# Patient Record
Sex: Female | Born: 1945 | Race: Black or African American | Hispanic: No | Smoking: Former smoker
Health system: Southern US, Community
[De-identification: ages and names within clinical notes are randomized; demographics above are authoritative.]

## PROBLEM LIST (undated history)

## (undated) DIAGNOSIS — K5792 Diverticulitis of intestine, part unspecified, without perforation or abscess without bleeding: Secondary | ICD-10-CM

## (undated) DIAGNOSIS — E079 Disorder of thyroid, unspecified: Secondary | ICD-10-CM

## (undated) DIAGNOSIS — E119 Type 2 diabetes mellitus without complications: Secondary | ICD-10-CM

## (undated) DIAGNOSIS — N289 Disorder of kidney and ureter, unspecified: Secondary | ICD-10-CM

## (undated) DIAGNOSIS — C801 Malignant (primary) neoplasm, unspecified: Secondary | ICD-10-CM

## (undated) DIAGNOSIS — I2541 Coronary artery aneurysm: Secondary | ICD-10-CM

## (undated) DIAGNOSIS — L97929 Non-pressure chronic ulcer of unspecified part of left lower leg with unspecified severity: Secondary | ICD-10-CM

## (undated) DIAGNOSIS — M1990 Unspecified osteoarthritis, unspecified site: Secondary | ICD-10-CM

## (undated) DIAGNOSIS — N189 Chronic kidney disease, unspecified: Secondary | ICD-10-CM

## (undated) DIAGNOSIS — I509 Heart failure, unspecified: Secondary | ICD-10-CM

## (undated) DIAGNOSIS — I10 Essential (primary) hypertension: Secondary | ICD-10-CM

## (undated) DIAGNOSIS — J349 Unspecified disorder of nose and nasal sinuses: Secondary | ICD-10-CM

## (undated) DIAGNOSIS — E785 Hyperlipidemia, unspecified: Secondary | ICD-10-CM

## (undated) HISTORY — DX: Chronic kidney disease, unspecified: N18.9

## (undated) HISTORY — DX: Hyperlipidemia, unspecified: E78.5

## (undated) HISTORY — DX: Type 2 diabetes mellitus without complications: E11.9

## (undated) HISTORY — DX: Essential (primary) hypertension: I10

## (undated) HISTORY — DX: Unspecified osteoarthritis, unspecified site: M19.90

## (undated) HISTORY — DX: Unspecified disorder of nose and nasal sinuses: J34.9

## (undated) HISTORY — DX: Disorder of thyroid, unspecified: E07.9

## (undated) HISTORY — PX: ABDOMINAL HYSTERECTOMY: SHX81

## (undated) HISTORY — DX: Non-pressure chronic ulcer of unspecified part of left lower leg with unspecified severity: L97.929

## (undated) HISTORY — PX: CYST EXCISION: SHX5701

## (undated) HISTORY — DX: Coronary artery aneurysm: I25.41

---

## 1982-10-25 HISTORY — PX: DILATION AND CURETTAGE OF UTERUS: SHX78

## 1983-10-26 HISTORY — PX: PARTIAL HYSTERECTOMY: SHX80

## 2002-10-25 HISTORY — PX: ROTATOR CUFF REPAIR: SHX139

## 2005-09-14 ENCOUNTER — Ambulatory Visit: Payer: Self-pay | Admitting: Unknown Physician Specialty

## 2006-10-13 ENCOUNTER — Ambulatory Visit: Payer: Self-pay | Admitting: Unknown Physician Specialty

## 2006-10-21 ENCOUNTER — Ambulatory Visit: Payer: Self-pay | Admitting: Unknown Physician Specialty

## 2007-01-24 ENCOUNTER — Emergency Department: Payer: Self-pay | Admitting: Emergency Medicine

## 2007-01-30 ENCOUNTER — Emergency Department: Payer: Self-pay | Admitting: Emergency Medicine

## 2007-01-30 ENCOUNTER — Ambulatory Visit: Payer: Self-pay | Admitting: Unknown Physician Specialty

## 2007-01-31 ENCOUNTER — Ambulatory Visit: Payer: Self-pay | Admitting: Rheumatology

## 2007-02-01 ENCOUNTER — Ambulatory Visit: Payer: Self-pay | Admitting: Unknown Physician Specialty

## 2007-08-03 ENCOUNTER — Ambulatory Visit: Payer: Self-pay | Admitting: Unknown Physician Specialty

## 2008-01-25 ENCOUNTER — Other Ambulatory Visit: Payer: Self-pay

## 2008-01-25 ENCOUNTER — Emergency Department: Payer: Self-pay | Admitting: Emergency Medicine

## 2008-01-26 ENCOUNTER — Ambulatory Visit: Payer: Self-pay | Admitting: Emergency Medicine

## 2008-02-16 ENCOUNTER — Emergency Department: Payer: Self-pay | Admitting: Emergency Medicine

## 2009-06-09 ENCOUNTER — Emergency Department: Payer: Self-pay | Admitting: Emergency Medicine

## 2009-06-20 ENCOUNTER — Emergency Department (HOSPITAL_COMMUNITY): Admission: EM | Admit: 2009-06-20 | Discharge: 2009-06-20 | Payer: Self-pay | Admitting: Emergency Medicine

## 2009-06-28 ENCOUNTER — Ambulatory Visit: Payer: Self-pay | Admitting: Orthopedic Surgery

## 2009-09-16 ENCOUNTER — Ambulatory Visit: Payer: Self-pay | Admitting: Cardiology

## 2009-09-30 ENCOUNTER — Ambulatory Visit: Payer: Self-pay | Admitting: Unknown Physician Specialty

## 2009-11-04 ENCOUNTER — Ambulatory Visit: Payer: Self-pay | Admitting: Orthopedic Surgery

## 2009-12-05 ENCOUNTER — Encounter: Payer: Self-pay | Admitting: Orthopedic Surgery

## 2009-12-18 ENCOUNTER — Inpatient Hospital Stay: Payer: Self-pay | Admitting: Internal Medicine

## 2009-12-23 ENCOUNTER — Encounter: Payer: Self-pay | Admitting: Orthopedic Surgery

## 2010-01-23 ENCOUNTER — Encounter: Payer: Self-pay | Admitting: Orthopedic Surgery

## 2010-02-22 ENCOUNTER — Encounter: Payer: Self-pay | Admitting: Orthopedic Surgery

## 2011-01-30 LAB — CBC
Hemoglobin: 11.4 g/dL — ABNORMAL LOW (ref 12.0–15.0)
MCHC: 33.3 g/dL (ref 30.0–36.0)
Platelets: 235 10*3/uL (ref 150–400)
RDW: 13.5 % (ref 11.5–15.5)

## 2011-01-30 LAB — DIFFERENTIAL
Basophils Absolute: 0.1 10*3/uL (ref 0.0–0.1)
Basophils Relative: 1 % (ref 0–1)
Lymphocytes Relative: 24 % (ref 12–46)
Neutro Abs: 6.2 10*3/uL (ref 1.7–7.7)
Neutrophils Relative %: 64 % (ref 43–77)

## 2011-01-30 LAB — POCT CARDIAC MARKERS
Myoglobin, poc: 112 ng/mL (ref 12–200)
Troponin i, poc: <0.05 ng/mL (ref 0.00–0.09)
Troponin i, poc: <0.05 ng/mL (ref 0.00–0.09)

## 2011-01-30 LAB — POCT I-STAT, CHEM 8
BUN: 22 mg/dL (ref 6–23)
Calcium, Ion: 1.22 mmol/L (ref 1.12–1.32)
Chloride: 105 mEq/L (ref 96–112)
HCT: 35 % — ABNORMAL LOW (ref 36.0–46.0)
Potassium: 3.7 mEq/L (ref 3.5–5.1)
Sodium: 141 mEq/L (ref 135–145)

## 2011-06-24 ENCOUNTER — Ambulatory Visit: Payer: Self-pay | Admitting: Cardiology

## 2011-10-13 ENCOUNTER — Ambulatory Visit: Payer: Self-pay | Admitting: Unknown Physician Specialty

## 2012-01-19 ENCOUNTER — Emergency Department: Payer: Self-pay

## 2012-01-19 LAB — CBC
HGB: 11.2 g/dL — ABNORMAL LOW (ref 12.0–16.0)
MCHC: 33.6 g/dL (ref 32.0–36.0)
MCV: 86 fL (ref 80–100)
WBC: 9.6 10*3/uL (ref 3.6–11.0)

## 2012-01-19 LAB — TROPONIN I: Troponin-I: <0.02 ng/mL

## 2012-01-19 LAB — BASIC METABOLIC PANEL
BUN: 23 mg/dL — ABNORMAL HIGH (ref 7–18)
Calcium, Total: 8.7 mg/dL (ref 8.5–10.1)
Chloride: 106 mmol/L (ref 98–107)
Creatinine: 1.33 mg/dL — ABNORMAL HIGH (ref 0.60–1.30)
EGFR (African American): 51 — ABNORMAL LOW
Glucose: 111 mg/dL — ABNORMAL HIGH (ref 65–99)
Potassium: 3.9 mmol/L (ref 3.5–5.1)
Sodium: 141 mmol/L (ref 136–145)

## 2012-01-19 LAB — MAGNESIUM: Magnesium: 1.4 mg/dL — ABNORMAL LOW

## 2012-06-01 ENCOUNTER — Ambulatory Visit: Payer: Self-pay | Admitting: Unknown Physician Specialty

## 2013-05-07 ENCOUNTER — Ambulatory Visit: Payer: Self-pay | Admitting: Ophthalmology

## 2013-05-16 ENCOUNTER — Ambulatory Visit: Payer: Self-pay | Admitting: Ophthalmology

## 2013-07-28 ENCOUNTER — Other Ambulatory Visit: Payer: Self-pay | Admitting: Family Medicine

## 2013-07-28 LAB — CBC WITH DIFFERENTIAL/PLATELET
Basophil #: 0.1 10*3/uL (ref 0.0–0.1)
Basophil %: 1 %
Eosinophil #: 0.3 10*3/uL (ref 0.0–0.7)
Eosinophil %: 2.8 %
HCT: 32.1 % — ABNORMAL LOW (ref 35.0–47.0)
HGB: 10.9 g/dL — ABNORMAL LOW (ref 12.0–16.0)
Lymphocyte #: 2 10*3/uL (ref 1.0–3.6)
Lymphocyte %: 20.4 %
MCH: 29.5 pg (ref 26.0–34.0)
MCHC: 34.1 g/dL (ref 32.0–36.0)
MCV: 86 fL (ref 80–100)
Monocyte #: 0.8 x10 3/mm (ref 0.2–0.9)
Monocyte %: 8.5 %
Neutrophil #: 6.5 10*3/uL (ref 1.4–6.5)
Neutrophil %: 67.3 %
Platelet: 224 10*3/uL (ref 150–440)
RBC: 3.72 10*6/uL — ABNORMAL LOW (ref 3.80–5.20)
RDW: 14 % (ref 11.5–14.5)
WBC: 9.7 10*3/uL (ref 3.6–11.0)

## 2013-08-06 ENCOUNTER — Observation Stay: Payer: Self-pay | Admitting: Internal Medicine

## 2013-08-06 LAB — COMPREHENSIVE METABOLIC PANEL
Albumin: 3.5 g/dL (ref 3.4–5.0)
Chloride: 106 mmol/L (ref 98–107)
Co2: 25 mmol/L (ref 21–32)
Creatinine: 1.8 mg/dL — ABNORMAL HIGH (ref 0.60–1.30)
EGFR (Non-African Amer.): 29 — ABNORMAL LOW
Glucose: 230 mg/dL — ABNORMAL HIGH (ref 65–99)
Osmolality: 292 (ref 275–301)
Potassium: 3.3 mmol/L — ABNORMAL LOW (ref 3.5–5.1)
Sodium: 140 mmol/L (ref 136–145)
Total Protein: 7.7 g/dL (ref 6.4–8.2)

## 2013-08-06 LAB — CBC WITH DIFFERENTIAL/PLATELET
Basophil #: 0.1 10*3/uL (ref 0.0–0.1)
HGB: 11.1 g/dL — ABNORMAL LOW (ref 12.0–16.0)
Lymphocyte #: 2.9 10*3/uL (ref 1.0–3.6)
Lymphocyte %: 25 %
MCH: 29.4 pg (ref 26.0–34.0)
Neutrophil #: 7.4 10*3/uL — ABNORMAL HIGH (ref 1.4–6.5)
Neutrophil %: 64 %
RDW: 13.8 % (ref 11.5–14.5)
WBC: 11.6 10*3/uL — ABNORMAL HIGH (ref 3.6–11.0)

## 2013-08-06 LAB — URINALYSIS, COMPLETE
Glucose,UR: 150 mg/dL (ref 0–75)
Ketone: NEGATIVE
Leukocyte Esterase: NEGATIVE
Protein: 100
Specific Gravity: 1.006 (ref 1.003–1.030)
Squamous Epithelial: 1

## 2013-08-06 LAB — CK TOTAL AND CKMB (NOT AT ARMC)
CK, Total: 164 U/L (ref 21–215)
CK, Total: 159 U/L (ref 21–215)
CK-MB: 1.4 ng/mL (ref 0.5–3.6)

## 2013-08-06 LAB — TROPONIN I: Troponin-I: 0.02 ng/mL

## 2013-08-07 LAB — CBC WITH DIFFERENTIAL/PLATELET
Basophil %: 1 %
Lymphocyte #: 3 10*3/uL (ref 1.0–3.6)
MCV: 87 fL (ref 80–100)
Monocyte %: 9.2 %
Neutrophil %: 57.4 %
Platelet: 244 10*3/uL (ref 150–440)
RBC: 3.62 10*6/uL — ABNORMAL LOW (ref 3.80–5.20)
RDW: 13.7 % (ref 11.5–14.5)
WBC: 10.6 10*3/uL (ref 3.6–11.0)

## 2013-08-07 LAB — BASIC METABOLIC PANEL
Anion Gap: 6 — ABNORMAL LOW (ref 7–16)
Calcium, Total: 9.3 mg/dL (ref 8.5–10.1)
Co2: 25 mmol/L (ref 21–32)
EGFR (Non-African Amer.): 34 — ABNORMAL LOW

## 2014-02-15 ENCOUNTER — Observation Stay: Payer: Self-pay | Admitting: Internal Medicine

## 2014-02-15 LAB — CBC
HCT: 34.1 % — ABNORMAL LOW (ref 35.0–47.0)
HGB: 11.4 g/dL — ABNORMAL LOW (ref 12.0–16.0)
MCH: 29.8 pg (ref 26.0–34.0)
MCHC: 33.6 g/dL (ref 32.0–36.0)
MCV: 89 fL (ref 80–100)
PLATELETS: 218 10*3/uL (ref 150–440)
RBC: 3.84 10*6/uL (ref 3.80–5.20)
RDW: 14.1 % (ref 11.5–14.5)
WBC: 10.9 10*3/uL (ref 3.6–11.0)

## 2014-02-15 LAB — TROPONIN I
Troponin-I: <0.02 ng/mL
Troponin-I: <0.02 ng/mL

## 2014-02-15 LAB — BASIC METABOLIC PANEL
Anion Gap: 4 — ABNORMAL LOW (ref 7–16)
BUN: 35 mg/dL — AB (ref 7–18)
CREATININE: 1.75 mg/dL — AB (ref 0.60–1.30)
Calcium, Total: 8.7 mg/dL (ref 8.5–10.1)
Chloride: 106 mmol/L (ref 98–107)
Co2: 27 mmol/L (ref 21–32)
GFR CALC AF AMER: 34 — AB
GFR CALC NON AF AMER: 29 — AB
Glucose: 251 mg/dL — ABNORMAL HIGH (ref 65–99)
Osmolality: 290 (ref 275–301)
POTASSIUM: 3.8 mmol/L (ref 3.5–5.1)
SODIUM: 137 mmol/L (ref 136–145)

## 2014-02-15 LAB — HEPATIC FUNCTION PANEL A (ARMC)
ALK PHOS: 63 U/L
ALT: 16 U/L (ref 12–78)
AST: 21 U/L (ref 15–37)
Albumin: 3.6 g/dL (ref 3.4–5.0)
Bilirubin,Total: 0.3 mg/dL (ref 0.2–1.0)
Total Protein: 8 g/dL (ref 6.4–8.2)

## 2014-02-15 LAB — CK TOTAL AND CKMB (NOT AT ARMC)
CK, TOTAL: 182 U/L
CK, Total: 168 U/L
CK, Total: 208 U/L — ABNORMAL HIGH
CK-MB: 1.2 ng/mL (ref 0.5–3.6)
CK-MB: 1.1 ng/mL (ref 0.5–3.6)
CK-MB: 1.2 ng/mL (ref 0.5–3.6)

## 2014-02-16 DIAGNOSIS — R079 Chest pain, unspecified: Secondary | ICD-10-CM

## 2014-02-16 LAB — CBC WITH DIFFERENTIAL/PLATELET
Basophil #: 0.1 10*3/uL (ref 0.0–0.1)
Basophil %: 1 %
Eosinophil #: 0.3 10*3/uL (ref 0.0–0.7)
Eosinophil %: 3.1 %
HCT: 30.4 % — AB (ref 35.0–47.0)
HGB: 10.2 g/dL — ABNORMAL LOW (ref 12.0–16.0)
Lymphocyte #: 2.6 10*3/uL (ref 1.0–3.6)
Lymphocyte %: 26.3 %
MCH: 29.4 pg (ref 26.0–34.0)
MCHC: 33.4 g/dL (ref 32.0–36.0)
MCV: 88 fL (ref 80–100)
Monocyte #: 1 x10 3/mm — ABNORMAL HIGH (ref 0.2–0.9)
Monocyte %: 10.2 %
Neutrophil #: 5.9 10*3/uL (ref 1.4–6.5)
Neutrophil %: 59.4 %
PLATELETS: 218 10*3/uL (ref 150–440)
RBC: 3.45 10*6/uL — ABNORMAL LOW (ref 3.80–5.20)
RDW: 13.8 % (ref 11.5–14.5)
WBC: 10 10*3/uL (ref 3.6–11.0)

## 2014-02-16 LAB — BASIC METABOLIC PANEL
Anion Gap: 5 — ABNORMAL LOW (ref 7–16)
BUN: 25 mg/dL — ABNORMAL HIGH (ref 7–18)
CREATININE: 1.63 mg/dL — AB (ref 0.60–1.30)
Calcium, Total: 8.8 mg/dL (ref 8.5–10.1)
Chloride: 110 mmol/L — ABNORMAL HIGH (ref 98–107)
Co2: 28 mmol/L (ref 21–32)
EGFR (Non-African Amer.): 32 — ABNORMAL LOW
GFR CALC AF AMER: 37 — AB
Glucose: 55 mg/dL — ABNORMAL LOW (ref 65–99)
OSMOLALITY: 287 (ref 275–301)
Potassium: 3.6 mmol/L (ref 3.5–5.1)
SODIUM: 143 mmol/L (ref 136–145)

## 2014-02-16 LAB — LIPID PANEL
Cholesterol: 140 mg/dL (ref 0–200)
HDL Cholesterol: 66 mg/dL — ABNORMAL HIGH (ref 40–60)
Ldl Cholesterol, Calc: 61 mg/dL (ref 0–100)
Triglycerides: 63 mg/dL (ref 0–200)
VLDL Cholesterol, Calc: 13 mg/dL (ref 5–40)

## 2014-02-16 LAB — SEDIMENTATION RATE: Erythrocyte Sed Rate: 70 mm/hr — ABNORMAL HIGH (ref 0–30)

## 2014-02-16 LAB — MAGNESIUM: Magnesium: 1.6 mg/dL — ABNORMAL LOW

## 2014-03-01 DIAGNOSIS — M169 Osteoarthritis of hip, unspecified: Secondary | ICD-10-CM | POA: Insufficient documentation

## 2014-03-01 DIAGNOSIS — G5603 Carpal tunnel syndrome, bilateral upper limbs: Secondary | ICD-10-CM | POA: Insufficient documentation

## 2014-03-01 DIAGNOSIS — E785 Hyperlipidemia, unspecified: Secondary | ICD-10-CM | POA: Insufficient documentation

## 2014-03-01 DIAGNOSIS — E039 Hypothyroidism, unspecified: Secondary | ICD-10-CM | POA: Insufficient documentation

## 2014-04-23 ENCOUNTER — Emergency Department: Payer: Self-pay | Admitting: Emergency Medicine

## 2014-04-23 LAB — URINALYSIS, COMPLETE
Bilirubin,UR: NEGATIVE
Glucose,UR: NEGATIVE mg/dL (ref 0–75)
KETONE: NEGATIVE
Nitrite: NEGATIVE
Ph: 8 (ref 4.5–8.0)
Protein: NEGATIVE
Specific Gravity: 1.004 (ref 1.003–1.030)
WBC UR: 2 /HPF (ref 0–5)

## 2014-04-23 LAB — CBC WITH DIFFERENTIAL/PLATELET
BASOS ABS: 0.1 10*3/uL (ref 0.0–0.1)
Basophil %: 1.3 %
EOS ABS: 0.3 10*3/uL (ref 0.0–0.7)
Eosinophil %: 3.7 %
HCT: 34.8 % — AB (ref 35.0–47.0)
HGB: 11 g/dL — ABNORMAL LOW (ref 12.0–16.0)
LYMPHS ABS: 1.2 10*3/uL (ref 1.0–3.6)
Lymphocyte %: 14.9 %
MCH: 28.5 pg (ref 26.0–34.0)
MCHC: 31.8 g/dL — AB (ref 32.0–36.0)
MCV: 90 fL (ref 80–100)
Monocyte #: 0.8 x10 3/mm (ref 0.2–0.9)
Monocyte %: 9.2 %
NEUTROS ABS: 5.9 10*3/uL (ref 1.4–6.5)
Neutrophil %: 70.9 %
PLATELETS: 222 10*3/uL (ref 150–440)
RBC: 3.88 10*6/uL (ref 3.80–5.20)
RDW: 14.3 % (ref 11.5–14.5)
WBC: 8.4 10*3/uL (ref 3.6–11.0)

## 2014-04-23 LAB — TROPONIN I
Troponin-I: <0.02 ng/mL
Troponin-I: <0.02 ng/mL

## 2014-04-23 LAB — BASIC METABOLIC PANEL
Anion Gap: 7 (ref 7–16)
BUN: 26 mg/dL — AB (ref 7–18)
CREATININE: 1.88 mg/dL — AB (ref 0.60–1.30)
Calcium, Total: 9.2 mg/dL (ref 8.5–10.1)
Chloride: 104 mmol/L (ref 98–107)
Co2: 27 mmol/L (ref 21–32)
EGFR (African American): 31 — ABNORMAL LOW
GFR CALC NON AF AMER: 27 — AB
Glucose: 156 mg/dL — ABNORMAL HIGH (ref 65–99)
Osmolality: 284 (ref 275–301)
POTASSIUM: 4.1 mmol/L (ref 3.5–5.1)
Sodium: 138 mmol/L (ref 136–145)

## 2014-05-20 ENCOUNTER — Observation Stay: Payer: Self-pay | Admitting: Internal Medicine

## 2014-05-20 LAB — COMPREHENSIVE METABOLIC PANEL
ALBUMIN: 3 g/dL — AB (ref 3.4–5.0)
ALT: 27 U/L
ANION GAP: 7 (ref 7–16)
AST: 14 U/L — AB (ref 15–37)
Alkaline Phosphatase: 54 U/L
BUN: 39 mg/dL — AB (ref 7–18)
Bilirubin,Total: 0.6 mg/dL (ref 0.2–1.0)
CALCIUM: 8.7 mg/dL (ref 8.5–10.1)
CHLORIDE: 103 mmol/L (ref 98–107)
Co2: 29 mmol/L (ref 21–32)
Creatinine: 1.87 mg/dL — ABNORMAL HIGH (ref 0.60–1.30)
EGFR (Non-African Amer.): 27 — ABNORMAL LOW
GFR CALC AF AMER: 31 — AB
Glucose: 102 mg/dL — ABNORMAL HIGH (ref 65–99)
Osmolality: 287 (ref 275–301)
POTASSIUM: 3.4 mmol/L — AB (ref 3.5–5.1)
SODIUM: 139 mmol/L (ref 136–145)
TOTAL PROTEIN: 6.6 g/dL (ref 6.4–8.2)

## 2014-05-20 LAB — CBC
HCT: 35.1 % (ref 35.0–47.0)
HGB: 11.4 g/dL — AB (ref 12.0–16.0)
MCH: 29.4 pg (ref 26.0–34.0)
MCHC: 32.6 g/dL (ref 32.0–36.0)
MCV: 90 fL (ref 80–100)
Platelet: 146 10*3/uL — ABNORMAL LOW (ref 150–440)
RBC: 3.89 10*6/uL (ref 3.80–5.20)
RDW: 14.7 % — AB (ref 11.5–14.5)
WBC: 14.5 10*3/uL — AB (ref 3.6–11.0)

## 2014-05-20 LAB — TROPONIN I
Troponin-I: <0.02 ng/mL
Troponin-I: <0.02 ng/mL

## 2014-05-21 LAB — BASIC METABOLIC PANEL
ANION GAP: 8 (ref 7–16)
BUN: 35 mg/dL — AB (ref 7–18)
CHLORIDE: 104 mmol/L (ref 98–107)
CO2: 28 mmol/L (ref 21–32)
Calcium, Total: 8.9 mg/dL (ref 8.5–10.1)
Creatinine: 2.1 mg/dL — ABNORMAL HIGH (ref 0.60–1.30)
EGFR (Non-African Amer.): 24 — ABNORMAL LOW
GFR CALC AF AMER: 27 — AB
Glucose: 144 mg/dL — ABNORMAL HIGH (ref 65–99)
Osmolality: 290 (ref 275–301)
POTASSIUM: 4.1 mmol/L (ref 3.5–5.1)
Sodium: 140 mmol/L (ref 136–145)

## 2014-05-21 LAB — CBC WITH DIFFERENTIAL/PLATELET
BASOS PCT: 0.2 %
Basophil #: 0 10*3/uL (ref 0.0–0.1)
Eosinophil #: 0 10*3/uL (ref 0.0–0.7)
Eosinophil %: 0 %
HCT: 33.7 % — AB (ref 35.0–47.0)
HGB: 10.8 g/dL — ABNORMAL LOW (ref 12.0–16.0)
LYMPHS ABS: 1.3 10*3/uL (ref 1.0–3.6)
Lymphocyte %: 9.6 %
MCH: 28.9 pg (ref 26.0–34.0)
MCHC: 32.2 g/dL (ref 32.0–36.0)
MCV: 90 fL (ref 80–100)
MONO ABS: 0.9 x10 3/mm (ref 0.2–0.9)
Monocyte %: 7 %
NEUTROS PCT: 83.2 %
Neutrophil #: 11.1 10*3/uL — ABNORMAL HIGH (ref 1.4–6.5)
PLATELETS: 149 10*3/uL — AB (ref 150–440)
RBC: 3.74 10*6/uL — ABNORMAL LOW (ref 3.80–5.20)
RDW: 14.4 % (ref 11.5–14.5)
WBC: 13.4 10*3/uL — AB (ref 3.6–11.0)

## 2014-05-21 LAB — LIPID PANEL
CHOLESTEROL: 190 mg/dL (ref 0–200)
HDL: 97 mg/dL — AB (ref 40–60)
Ldl Cholesterol, Calc: 83 mg/dL (ref 0–100)
Triglycerides: 52 mg/dL (ref 0–200)
VLDL Cholesterol, Calc: 10 mg/dL (ref 5–40)

## 2014-05-21 LAB — PROTIME-INR
INR: 1
Prothrombin Time: 13.4 secs (ref 11.5–14.7)

## 2014-05-22 LAB — BASIC METABOLIC PANEL
Anion Gap: 6 — ABNORMAL LOW (ref 7–16)
BUN: 28 mg/dL — ABNORMAL HIGH (ref 7–18)
CO2: 29 mmol/L (ref 21–32)
CREATININE: 1.75 mg/dL — AB (ref 0.60–1.30)
Calcium, Total: 7.9 mg/dL — ABNORMAL LOW (ref 8.5–10.1)
Chloride: 110 mmol/L — ABNORMAL HIGH (ref 98–107)
EGFR (African American): 34 — ABNORMAL LOW
GFR CALC NON AF AMER: 29 — AB
Glucose: 61 mg/dL — ABNORMAL LOW (ref 65–99)
OSMOLALITY: 292 (ref 275–301)
Potassium: 3.9 mmol/L (ref 3.5–5.1)
Sodium: 145 mmol/L (ref 136–145)

## 2014-05-22 LAB — CBC WITH DIFFERENTIAL/PLATELET
BASOS ABS: 0 10*3/uL (ref 0.0–0.1)
Basophil %: 0.2 %
EOS ABS: 0.2 10*3/uL (ref 0.0–0.7)
EOS PCT: 2.4 %
HCT: 30.1 % — AB (ref 35.0–47.0)
HGB: 9.9 g/dL — AB (ref 12.0–16.0)
Lymphocyte #: 2.5 10*3/uL (ref 1.0–3.6)
Lymphocyte %: 28.8 %
MCH: 29.9 pg (ref 26.0–34.0)
MCHC: 32.8 g/dL (ref 32.0–36.0)
MCV: 91 fL (ref 80–100)
Monocyte #: 0.8 x10 3/mm (ref 0.2–0.9)
Monocyte %: 9.9 %
NEUTROS ABS: 5 10*3/uL (ref 1.4–6.5)
NEUTROS PCT: 58.7 %
Platelet: 113 10*3/uL — ABNORMAL LOW (ref 150–440)
RBC: 3.3 10*6/uL — ABNORMAL LOW (ref 3.80–5.20)
RDW: 14.6 % — AB (ref 11.5–14.5)
WBC: 8.5 10*3/uL (ref 3.6–11.0)

## 2014-05-27 DIAGNOSIS — Z9889 Other specified postprocedural states: Secondary | ICD-10-CM | POA: Insufficient documentation

## 2014-06-07 ENCOUNTER — Ambulatory Visit: Payer: Self-pay | Admitting: Nephrology

## 2014-12-31 ENCOUNTER — Ambulatory Visit: Payer: Self-pay | Admitting: Internal Medicine

## 2015-01-06 ENCOUNTER — Other Ambulatory Visit: Payer: Self-pay | Admitting: Rheumatology

## 2015-01-07 ENCOUNTER — Other Ambulatory Visit: Payer: Self-pay | Admitting: Rheumatology

## 2015-01-07 DIAGNOSIS — M79606 Pain in leg, unspecified: Secondary | ICD-10-CM

## 2015-01-07 DIAGNOSIS — M549 Dorsalgia, unspecified: Secondary | ICD-10-CM

## 2015-01-23 ENCOUNTER — Ambulatory Visit
Admission: RE | Admit: 2015-01-23 | Discharge: 2015-01-23 | Disposition: A | Discharge status: Home / Self Care | Payer: PPO | Source: Ambulatory Visit | Attending: Rheumatology | Admitting: Rheumatology

## 2015-01-23 DIAGNOSIS — M549 Dorsalgia, unspecified: Secondary | ICD-10-CM

## 2015-01-23 DIAGNOSIS — M79606 Pain in leg, unspecified: Secondary | ICD-10-CM

## 2015-01-31 ENCOUNTER — Encounter
Admit: 2015-01-31 | Disposition: A | Discharge status: Home / Self Care | Payer: Self-pay | Attending: Surgery | Admitting: Surgery

## 2015-02-07 DIAGNOSIS — L97929 Non-pressure chronic ulcer of unspecified part of left lower leg with unspecified severity: Secondary | ICD-10-CM

## 2015-02-07 DIAGNOSIS — N189 Chronic kidney disease, unspecified: Secondary | ICD-10-CM

## 2015-02-07 HISTORY — DX: Non-pressure chronic ulcer of unspecified part of left lower leg with unspecified severity: L97.929

## 2015-02-07 HISTORY — DX: Chronic kidney disease, unspecified: N18.9

## 2015-02-14 NOTE — H&P (Signed)
PATIENT NAME:  TERRAN, [name] MR#:  [id number] DATE OF BIRTH:  1946/01/24  DATE OF ADMISSION:  08/06/2013  PRIMARY CARE PHYSICIAN: Dr. Kem Kays,  CHIEF COMPLAINT: Chest pain.   HISTORY OF PRESENT ILLNESS: This is a 69 year old female with significant past medical history of coronary artery disease being followed by Dr. Saralyn Pilar. Last cardiac catheter in 2012 showing 60% stenosis in the mid left circumflex and 50% stenosis of the PDA, history of diabetes, hypertension, hyperlipidemia, who presents with complaint of chest pain. The patient  reports her chest pain is midsternal, radiating to the left arm, happened at rest.  It was accompanied by some mild palpitations and diaphoresis. Denies any shortness of breath or lightheadedness. Pain resolved after the patient receiving sublingual nitroglycerin. In the ED, the patient has no EKG changes. Her first troponin was negative. Currently, the patient reports she is chest pain-free. As well, the patient was found to have significantly elevated blood pressure upon presentation being 212/114, which improved after receiving IV metoprolol. As well, the patient was noticed to have a creatinine of 1.8. By reviewing her old records, the last one in [facility] appears to be at 1.71. She has mild hypokalemia at 3.3. The patient received 324 mg  of aspirin in the ED, and hospitalist service was requested to admit the patient for further evaluation of her chest pain. The patient currently denies any chest pain. Reports it has been resolved. Denies any shortness of breath, any lightheadedness, diaphoresis. Denies any coffee-ground emesis, melena, dysuria, polyuria, fever or chills.   PAST MEDICAL HISTORY: 1. History of coronary artery disease with 60% stenosis of mid left circumflex and 50% stenosis of PDA by cardiac catheter in August 2012.  2. Hypertension.  3. Hyperlipidemia.  4. Diabetes.   ALLERGIES:  1. CIPRO. 2. LEVAQUIN.  3. PENICILLIN.  4. SULFA.    SOCIAL HISTORY: The patient is married, takes care of her husband at home  . She quit tobacco abuse 45 years ago. No alcohol. No drug use.   FAMILY HISTORY: Significant for myocardial infarction in her father in his 36s.   PAST SURGICAL HISTORY:  1. Hysterectomy.  2. Rotator cuff surgery.   HOME MEDICATIONS: 1. Hydrochlorothiazide 25 mg daily.  2. Lantus 36 units subcutaneous at bedtime.  3. Lisinopril 20 mg 2 times a day.  4. Simvastatin 40 mg oral daily.  5. Claritin 10 mg daily.  6. Tylenol Extra Strength as needed.  7. Prilosec 20 mg oral daily.  8. Norvasc 5 mg daily.  9. Aspirin 81 mg 2 times a day.  10. Calcium with vitamin D.  11. Vitamin D3.  12. Humalog subcutaneous use as directed.  13. Metoprolol 25 mg oral p.o. b.i.d.   REVIEW OF SYSTEMS:  CONSTITUTIONAL: The patient denies fever, chills, fatigue, weakness, weight gain, weight loss.  EYES: Denies blurry vision, double vision, pain, inflammation, glaucoma.  ENT: Denies tinnitus, ear pain, hearing loss, epistaxis or discharge.  RESPIRATORY: Denies cough, wheezing, hemoptysis, dyspnea, painful respiratory.  CARDIOVASCULAR: Has complaints of chest pain. Denies any orthopnea, edema, syncope. Had complaint of some palpitations.  GASTROINTESTINAL: Denies nausea, vomiting, diarrhea, abdominal pain, hematemesis, melena, jaundice, rectal bleed.  GENITOURINARY: Denies dysuria, hematuria, renal colic.  ENDOCRINE: Denies polyuria, polydipsia, heat or cold intolerance.  HEMATOLOGY: Denies anemia, easy bruising, bleeding diathesis.  INTEGUMENTARY: Denies acne, rash or skin lesions.  MUSCULOSKELETAL: Denies any neck pain, shoulder pain, cramps, swelling.  NEUROLOGIC: Denies CVA, TIA, headache, vertigo, tremors, migraine.  PSYCHIATRIC: Denies anxiety,  insomnia, depression.   PHYSICAL EXAMINATION: VITAL SIGNS: Temperature 98, pulse 74, respiratory rate 15, blood pressure 172/80, saturating 98% on room air.  GENERAL: Elderly  female who looks comfortable in bed, in no apparent distress.  HEENT: Head atraumatic, normocephalic. Pupils equal, reactive to light. Pink conjunctivae. Anicteric sclerae. Moist oral mucosa.  NECK: Supple. No thyromegaly. No JVD.  CHEST: Good air entry bilaterally. No wheezing, rales, rhonchi.  CARDIOVASCULAR: S1, S2 heard. No rubs, murmur or gallops.  ABDOMEN: Soft, nontender, nondistended. Bowel sounds present.  EXTREMITIES: +1 edema bilaterally. No clubbing. No cyanosis. Dorsalis pedis pulse felt bilaterally  PSYCHIATRIC: Appropriate affect. Awake, alert x 3. Intact judgment and insight.  NEUROLOGIC: Grossly intact. Motor 5/5. No focal deficits.  LYMPHATICS: No cervical or supraclavicular lymphadenopathy.  CARDIOVASCULAR: The patient had dorsalis pedis pulse felt +2 bilaterally.   PERTINENT LABS: Glucose 230. BUN 27, creatinine 1.80, sodium 140, potassium 3.3, chloride 106, CO2 25. Troponin less than 0.02. White blood cells 11.6, hemoglobin 11.1, hematocrit 32.4, platelets 223, INR is 1. EKG showing sinus tachycardia is 102 beats per minute with frequent PVCs.   ASSESSMENT AND PLAN: 1. Chest pain, currently, the patient is chest pain-free. Giving her significant cardiac history and her chest pain resolved with nitroglycerin she will be admitted to telemetry unit. We will continue to cycle her cardiac enzymes and follow the trend. Already received aspirin and we will consult cardiology to see if there is any further work-up indicated at this point, especially with her known history of coronary artery disease. We will add sublingual nitroglycerin as needed.  2. Hypertension: Appears to be uncontrolled. We will continue the patient back on her home medication, as well we will add hydralazine.  3. History of coronary artery disease. The patient is on aspirin, statin, ACE inhibitor and beta blockers.  4. Chronic kidney disease seems to be at baseline. Last creatinine is 1.7. Today is 1.8, seems to  be stable. We will monitor closely, especially as the patient is on lisinopril.  5. Hyperlipidemia. Continue with statin.  6. Diabetes mellitus. We will resume Lantus at a lower dose, we will decrease from 36 to 22 at bedtime, we will add insulin sliding scale.  7. Deep vein thrombosis prophylaxis: We will have the patient on subcutaneous heparin.  8. Gastrointestinal prophylaxis: The patient is on proton pump inhibitor.  9. CODE STATUS: The patient does not have a LIVING WILL. Lives with her husband. Does not have healthcare power of attorney. She reports she is a FULL CODE.   TOTAL TIME SPENT ON ADMISSION AND PATIENT CARE: 55 minutes.    ____________________________ Albertine Patricia, MD dse:sg D: 08/06/2013 05:34:00 ET T: 08/06/2013 06:19:18 ET JOB#: [id number]  cc: Albertine Patricia, MD, <Dictator> Mallorey Odonell Graciela Husbands MD ELECTRONICALLY SIGNED 08/08/2013 4:53

## 2015-02-14 NOTE — Discharge Summary (Signed)
Dates of Admission and Diagnosis:  Date of Admission 06-Aug-2013   Date of Discharge 07-Aug-2013   Admitting Diagnosis Chest pain   Final Diagnosis Chest pain, MI ruled out   Discharge Diagnosis 1 Chest pain, MI ruled out   2 CAD   3 HTN   4 DM   5 CRI    Chief Complaint/History of Present Illness 69 year old lady with h/o non-occlusive CAD, HTN, DM, hyperlipidemia and CRI presented with chest pain at rest, radiating into the left arm and jaw. Cardiac cath on 06/24/2011 revealed 60% stenosis of mid left circumflex and 50% stenosis of PDA. No acute EKG changes noted on presentation to ED. Patient was admitted for further management.   Cardiology:  13-Oct-14 02:41   ECG interpretation Sinus tachycardia with frequent Premature ventricular complexes Septal infarct (cited on or before 25-Jan-2008) Abnormal ECG When compared with ECG of 07-May-2013 15:24, Premature ventricular complexes are now Present Vent. rate has increased BY 42 BPM ----------unconfirmed---------- Confirmed by OVERREAD, NOT ([id number]), editor PEARSON, BARBARA ([id number]) on 08/06/2013 2:05:13 PM  Ventricular Rate 102  Atrial Rate 102  P-R Interval 160  QRS Duration 84  QT 354  QTc 461  P Axis 55  R Axis 16  T Axis 93  14-Oct-14 10:50   Protocol LEXISCAN  Max Work Load 10  Total Exercise Time 61  Max Diastolic BP 98  Max Heart Rate 103  Max Predicted Heart Rate 153  Reason For Termination per lexiscan protocol  ECG interpretation Confirmed by OVERREAD, NOT ([id number]), editor PEARSON, BARBARA ([id number]) on 08/07/2013 11:18:35 AM  Routine Chem:  13-Oct-14 02:57   Glucose, Serum  230  Creatinine (comp)  1.80  eGFR (African American)  33  14-Oct-14 06:04   Glucose, Serum 71  Creatinine (comp)  1.56  eGFR (African American)  39  Cardiac:  13-Oct-14 02:57   Troponin I < 0.02 (0.00-0.05 0.05 ng/mL or less: NEGATIVE  Repeat testing in 3-6 hrs  if clinically indicated. >0.05 ng/mL: POTENTIAL  MYOCARDIAL INJURY.  Repeat  testing in 3-6 hrs if  clinically indicated. NOTE: An increase or decrease  of 30% or more on serial  testing suggests a  clinically important change)  CK, Total 164  CPK-MB, Serum 1.4 (Result(s) reported on 06 Aug 2013 at 03:36AM.)    11:24   Troponin I 0.04 (0.00-0.05 0.05 ng/mL or less: NEGATIVE  Repeat testing in 3-6 hrs  if clinically indicated. >0.05 ng/mL: POTENTIAL  MYOCARDIAL INJURY. Repeat  testing in 3-6 hrs if  clinically indicated. NOTE: An increase or decrease  of 30% or more on serial  testing suggests a  clinically important change)  CK, Total 159  CPK-MB, Serum 1.7 (Result(s) reported on 06 Aug 2013 at 12:08PM.)    18:47   Troponin I 0.02 (0.00-0.05 0.05 ng/mL or less: NEGATIVE  Repeat testing in 3-6 hrs  if clinically indicated. >0.05 ng/mL: POTENTIAL  MYOCARDIAL INJURY. Repeat  testing in 3-6 hrs if  clinically indicated. NOTE: An increase or decrease  of 30% or more on serial  testing suggests a  clinically important change)  CK, Total 159  CPK-MB, Serum 2.1 (Result(s) reported on 06 Aug 2013 at 07:36PM.)   PERTINENT RADIOLOGY STUDIES: XRay:    13-Oct-14 02:52, Chest Portable Single View  Chest Portable Single View   REASON FOR EXAM:    Chest pain  COMMENTS:       PROCEDURE: DXR - DXR PORTABLE CHEST SINGLE VIEW  - Aug 06 2013  2:52AM     RESULT: The lungs are clear. The cardiac silhouette and visualized bony   skeleton are unremarkable.    IMPRESSION:      1. Chest radiograph without evidence of acute cardiopulmonary disease.   2. Comparison to prior dated 01/19/2012.        Verified By: Mikki Santee, M.D., [place] Three sets of cardiac enzymes were negative. MI was ruled out. She received aspirin, beta-blocker, statin, nitrates and ACEI. Patient remained chest pain free during the hospital stay. HTN and diabetes remained controlled and creatinine was stable during the hospital stay. Stress  test was done today morning. Plan is to discharge home after confirming negative stress test results.   Condition on Discharge Stable   DISCHARGE INSTRUCTIONS HOME MEDS:  Medication Reconciliation: Patient's Home Medications at Discharge:     Medication Instructions  hydrochlorothiazide 25 mg oral tablet  1 tab(s) orally once a day (in the morning)    lisinopril 20 mg oral tablet  1 tab(s) orally in the morning and evening   calcium oyster shell  500 mg oral tablet  1 tab(s) orally once a day    metoprolol tablet 25 mg  1 tab(s) orally 2 times a day    prilosec otc 20 mg oral delayed release tablet  1 tab(s) orally once a day   simvastatin 40 mg oral tablet  1 tab(s) orally once a day (at bedtime)   aspirin 81 mg oral tablet   orally 2 times a day   humalog 17units breakfast, 12units lunch, 18units dinner     lantus 100 units/ml subcutaneous solution  36 unit(s) subcutaneous once a day (at bedtime)   claritin 10 mg oral tablet  1 tab(s) orally once a day   amlodipine 5 mg oral tablet  1 tab(s) orally once a day   vitamin d3 400 intl units oral tablet  1 tab(s) orally once a day   nitroglycerin 0.4 mg sublingual tablet  1 tab(s) sublingual , As needed, chest pain    PRESCRIPTIONS: ELECTRONICALLY SUBMITTED   Physician's Instructions:  Home Health? No   Home Oxygen? No   Diet Low Sodium  Low Fat, Low Cholesterol   Activity Limitations As tolerated   Return to Work after follow up visit with MD   Time frame for Follow Up Appointment 1-2 days  Dr. Glendon Axe   Time frame for Follow Up Appointment 1-2 weeks  [facility]   Electronic Signatures: Glendon Axe (MD)  (Signed 14-Oct-14 13:50)  Authored: ADMISSION DATE AND DIAGNOSIS, CHIEF COMPLAINT/HPI, PERTINENT LABS, [place], PATIENT INSTRUCTIONS   Last Updated: 14-Oct-14 13:50 by Glendon Axe (MD)

## 2015-02-14 NOTE — Op Note (Signed)
PATIENT NAME:  [name], [name] MR#:  [id number] DATE OF BIRTH:  1946-01-19  DATE OF PROCEDURE:  05/16/2013  PROCEDURES PERFORMED: 1.  Pars plana vitrectomy of the right eye.  2.  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  3.  Endolaser, right eye.   PREOPERATIVE DIAGNOSES: 1.  Dense vitreous hemorrhage.  2.  Proliferative diabetic retinopathy.  3.  Possible tractional retinal detachment.   POSTOPERATIVE DIAGNOSES: 1.  Dense vitreous hemorrhage.  2.  Proliferative diabetic retinopathy.  3.  Tractional retinal detachment.   PRIMARY SURGEON:  Garlan Fair, M.D.    ANESTHESIA: Retrobulbar block of the right eye with monitored anesthesia care.   COMPLICATIONS: None.   INDICATIONS FOR PROCEDURE: This patient came to my office with decreased vision in her right eye. Examination revealed a dense vitreous hemorrhage and proliferative diabetic retinopathy. Due to the vitreous hemorrhage, it was unclear whether there was a tractional retinal detachment associated with the membranes. There did appear to be elevation. Risks, benefits  and alternatives of the above procedure were discussed, and the patient wished to proceed.   DETAILS OF PROCEDURE: After informed consent was obtained, the patient was brought into the operative suite at [facility]. The patient was placed in supine position, was given a small dose of Alfenta, and a retrobulbar block was performed on the right eye by the primary surgeon without any complications. The right eye was prepped and draped in a sterile manner. After a lid speculum was inserted, a 25-gauge trocar was placed inferotemporally through displaced conjunctiva in an oblique fashion 4 mm beyond the limbus. The infusion cannula was turned on and inserted through the trocar and secured in position with Steri-Strips. Two more trocars were placed in a similar fashion superotemporally and superonasally. The vitreous cutter and light pipe  were introduced in the eye, and a core vitrectomy was performed.  The vitreous face was noted to be attached to the retina for 360 degrees. After the vitreous hemorrhage had been cleared, it became apparent that the patient had multiple areas of tractional retinal detachment in the macula. None of it extended to the fovea. The vitreous cutter, using suction and cut mode, was utilized to elevate the membranes and isolate them. Removal of the membranes was successful, and all traction around the superior and inferior temporal arcades were noted to be relieved. Vitreous face was carefully elevated for 360 degrees from the arcades out to the ora serrata. This was done after the posterior vitreous had been removed via dissection of the membranes. No holes or tears could be identified in any of the areas of traction. Endolaser was introduced and panretinal photocoagulation was performed for 360 degrees. An extra 2 rows of laser were placed around each of the areas of traction, in case there was a microscopic tear that could not be identified. A complete air-fluid exchange was performed. The trocars were removed, and the wounds were noted to be airtight. Pressure in the eye was confirmed to be approximately 15 mmHg. 5 mg of dexamethasone was given into the inferior fornix, and the lid speculum was removed. The eye was cleaned and TobraDex was placed in the eye. A patch and shield were placed over the eye, and the patient was taken to postanesthesia care with instruction to remain face down.      ____________________________ Teresa Pelton. Starling Manns, MD [id number] D: 05/16/2013 09:29:08 ET T: 05/16/2013 11:12:37 ET JOB#: [id number]  cc: Teresa Pelton. Charlynn Salih, MD, <Dictator> Ahlana Slaydon F Aundra Espin  MD ELECTRONICALLY SIGNED 06/13/2013 8:48

## 2015-02-14 NOTE — Consult Note (Signed)
   Present Illness 69 yo female with history of moderate coronary artery disease with a 60% stenosis of the mid left circumflex,  50% stenosis of the pda by cath in 8/12, history of hypertension, hyperlipidemia and diabetes who was admitted with complaints of chest pain.She has ruled out for an mi thus far. She states she has been compliant with her medicaitons. She denies syncopoe or presyncope. EKG was unremarkable for ischemia   Physical Exam:  GEN no acute distress   HEENT PERRL, hearing intact to voice   NECK supple   RESP normal resp effort  clear BS  no use of accessory muscles   CARD Regular rate and rhythm  Normal, S1, S2   ABD denies tenderness  no hernia  no Adominal Mass   LYMPH negative neck   EXTR negative cyanosis/clubbing, negative edema   SKIN normal to palpation   NEURO cranial nerves intact, motor/sensory function intact   PSYCH A+O to time, place, person   Review of Systems:  Subjective/Chief Complaint chest pain and shortness of breath   General: Fatigue   Skin: No Complaints   ENT: No Complaints   Eyes: No Complaints   Neck: No Complaints   Respiratory: Short of breath   Cardiovascular: Chest pain or discomfort  Tightness   Gastrointestinal: No Complaints   Genitourinary: No Complaints   Vascular: No Complaints   Musculoskeletal: No Complaints   Neurologic: No Complaints   Hematologic: No Complaints   Endocrine: No Complaints   Psychiatric: No Complaints   Review of Systems: All other systems were reviewed and found to be negative   Medications/Allergies Reviewed Medications/Allergies reviewed   EKG:  EKG NSR    Sulfa drugs: Itching, Rash, Swelling  Penicillin: Itching, Hives  Levaquin: Other  Cipro: Other   Impression 69 yo female with history of insignficant cad by cath in 2012 who was admitted with chest pain. She has ruled out for an mi thus far. EKG is unremarkable. She is hemodynamically stable at present. Review of  cath reveals that there were no critical lesions in 2012. Would ocnsider fucntional study to evaluate for evidence of ischmia.   Plan 1. Continue current meds 2. Funcitonals study in am to evalaute for evidence of if ischemia 3. Further recs after procedure.   Electronic Signatures: Teodoro Spray (MD)  (Signed 13-Oct-14 18:30)  Authored: General Aspect/Present Illness, History and Physical Exam, Review of System, EKG , Allergies, Impression/Plan   Last Updated: 13-Oct-14 18:30 by Teodoro Spray (MD)

## 2015-02-15 NOTE — Discharge Summary (Signed)
PATIENT NAME:  [name], HYPES MR#:  [id number] DATE OF BIRTH:  08-Mar-1946  DATE OF ADMISSION:  02/15/2014 DATE OF DISCHARGE:  02/16/2014  FINAL DIAGNOSES:  1.  Neck pain/heartburn, thought to be noncardiac.  2.  Coronary artery disease.  3.  Degenerative joint disease.  4.  Gastroesophageal reflux disease.  5.  Diabetes mellitus.   HISTORY AND PHYSICAL:  Please see dictated admission history and physical.  [place]:  The patient was admitted with discomfort that was noted to be in both shoulders, radiating down both arms, lasting about 2 minutes. Also a sensation of heartburn. Did have some discomfort to the jaw. This is something that she has had a past, and it is different from what she had with her heart pains. Cardiac enzymes were followed, with no significant rise noted that would suggest myocardial infarction. Cardiology saw the patient, and the patient has a fairly recent stress test back in October, a cath about 3 years ago. Her symptoms appeared to be more likely musculoskeletal, though gastroesophageal reflux cannot be excluded. We would discuss further options with patient, including watching her over the weekend and with planning a stress test on Monday versus increasing acid reduction medication, giving her nitroglycerin to use, and having her go home and follow up with Cardiology early next week. She favored going home, we discussed risks/benefits, and she was comfortable with this. Therefore, at this time, she will be discharged to home in stable condition with physical activity to be up as tolerated. She should check her blood sugar 3 times a day and record this. I will anticipate her following with Dr. Candiss Norse in the next 2 weeks and with Dr. Saralyn Pilar early next week to determine whether to proceed to stress testing.   DISCHARGE DIET:  2 grams sodium 1800 calorie ADA diet.   DISCHARGE MEDICATIONS:  1.  Hydrochlorothiazide 25 mg p.o. daily.  2.  Lisinopril 20 mg  p.o. b.i.d.  3.  Calcium 500 mg p.o. daily.  4.  Metoprolol tartrate 25 mg p.o. b.i.d.  5.  Aspirin 81 mg p.o. which she takes b.i.d.  6.  Humalog 17 units with breakfast, 12 units with lunch, and 18 units with dinner.  7.  Lantus 36 units subcutaneous at bedtime. 8.  Claritin 10 mg p.o. daily.  9.  Amlodipine 5 mg p.o. daily.  10.  Vitamin D 400 units p.o. daily.  11.  Tramadol 50 mg p.o. q.4 hours as needed for pain. 12.  Actos 15 mg p.o. daily.  13.  Omeprazole 20 mg p.o. b.i.d. for 1 week, then p.o. daily.  14.  Nitroglycerin 0.4 mg sublingually every 5 minutes times a max 3 in 1 day for chest pain.  15.  Atorvastatin 20 mg p.o. daily.   She was given instructions to stop simvastatin as this has been replaced with atorvastatin secondary to her use of amlodipine.    ____________________________ Adin Hector, MD bjk:jm D: 02/16/2014 09:10:05 ET T: 02/16/2014 16:34:33 ET JOB#: [id number]  cc: Adin Hector, MD, <Dictator> Ramonita Lab MD ELECTRONICALLY SIGNED 02/18/2014 7:37

## 2015-02-15 NOTE — Consult Note (Signed)
   Present Illness 69 yo female with history of moderate coronary artery disease with a 60% stenosis of the mid left circumflex,  50% stenosis of the pda by cath in 8/12, history of hypertension, hyperlipidemia and diabetes who was admitted with complaints of chest pain.She has ruled out for an mi thus far. She states she has been compliant with her kmeds. She denies syncopoe or presyncope. EKG was unremarkable for ischemia. Pain is improved. Renal function is moderately reduced with gfr of 31.   Physical Exam:  GEN no acute distress   HEENT PERRL, hearing intact to voice   NECK supple   RESP normal resp effort  clear BS  no use of accessory muscles   CARD Regular rate and rhythm  Normal, S1, S2   ABD denies tenderness  no hernia  no Adominal Mass   LYMPH negative neck   EXTR negative cyanosis/clubbing, negative edema   SKIN normal to palpation   NEURO cranial nerves intact, motor/sensory function intact   PSYCH A+O to time, place, person   Review of Systems:  Subjective/Chief Complaint chest pain and shortness of breath   General: Fatigue   Skin: No Complaints   ENT: No Complaints   Eyes: No Complaints   Neck: No Complaints   Respiratory: Short of breath   Cardiovascular: Chest pain or discomfort  Tightness   Gastrointestinal: No Complaints   Genitourinary: No Complaints   Vascular: No Complaints   Musculoskeletal: No Complaints   Neurologic: No Complaints   Hematologic: No Complaints   Endocrine: No Complaints   Psychiatric: No Complaints   Review of Systems: All other systems were reviewed and found to be negative   Medications/Allergies Reviewed Medications/Allergies reviewed   Family & Social History:  Family and Social History:  Family History Non-Contributory   Social History negative tobacco   EKG:  EKG NSR    Sulfa drugs: Itching, Rash, Swelling  Penicillin: Itching, Hives  Levaquin: Other  Cipro: Other   Impression 68 yo female  with history of insignficant cad by cath in 2012 who was admitted with chest pain. She has ruled out for an mi thus far. EKG is unremarkable. She was admitted in late 2014 with similar problems. SHe states the symptoms have been worsening. WIll need to consider cardiacx cath to evaluate anatomy in patient with diabetes, rest pain and several admission for chest pain with negative functional study.   Plan 1. Continue current meds 2. Risk and benefits of cardiac cath explained to patient and she agrees to procveed. 3. Will proceed with cath in am. Will need to limit contrast as patient has gfr of 31. Will hydrate over night 4 Further recs after procedure.   Electronic Signatures: Teodoro Spray (MD)  (Signed 27-Jul-15 16:36)  Authored: General Aspect/Present Illness, History and Physical Exam, Review of System, Family & Social History, EKG , Allergies, Impression/Plan   Last Updated: 27-Jul-15 16:36 by Teodoro Spray (MD)

## 2015-02-15 NOTE — Discharge Summary (Signed)
Dates of Admission and Diagnosis:  Date of Admission 20-May-2014   Date of Discharge 01-Jan-0001   Admitting Diagnosis Unstable angina   Final Diagnosis Unstable angina, MI ruled out, diabetes, CKD   Discharge Diagnosis 1 Unstable angina, MI ruled out   2 Diabetes   3 CKD    Chief Complaint/History of Present Illness 69 year old lady with h/o diabetes and CKD admitted with c/o chest pain radiating to the jaw. No acute changes on EKG. Initial troponin was negative.   Allergies:  Sulfa drugs: Itching, Rash, Swelling  Penicillin: Itching, Hives  Levaquin: Other  Cipro: Other    Routine Chem:  27-Jul-15 12:46   Creatinine (comp)  1.87  28-Jul-15 03:55   Creatinine (comp)  2.10  29-Jul-15 07:12   Glucose, Serum  61  BUN  28  Creatinine (comp)  1.75  Sodium, Serum 145  Potassium, Serum 3.9  Chloride, Serum  110  CO2, Serum 29  Calcium (Total), Serum  7.9  Anion Gap  6  Osmolality (calc) 292  eGFR (African American)  34  eGFR (Non-African American)  29 (eGFR values <70m/min/1.73 m2 may be an indication of chronic kidney disease (CKD). Calculated eGFR is useful in patients with stable renal function. The eGFR calculation will not be reliable in acutely ill patients when serum creatinine is changing rapidly. It is not useful in  patients on dialysis. The eGFR calculation may not be applicable to patients at the low and high extremes of body sizes, pregnant women, and vegetarians.)  Routine Hem:  29-Jul-15 07:12   WBC (CBC) 8.5  RBC (CBC)  3.30  Hemoglobin (CBC)  9.9  Hematocrit (CBC)  30.1  Platelet Count (CBC)  113  MCV 91  MCH 29.9  MCHC 32.8  RDW  14.6  Neutrophil % 58.7  Lymphocyte % 28.8  Monocyte % 9.9  Eosinophil % 2.4  Basophil % 0.2  Neutrophil # 5.0  Lymphocyte # 2.5  Monocyte # 0.8  Eosinophil # 0.2  Basophil # 0.0 (Result(s) reported on 22 May 2014 at 07:48AM.)   PERTINENT RADIOLOGY STUDIES: XRay:    27-Jul-15 14:36, Chest Portable Single  View  Chest Portable Single View   REASON FOR EXAM:    cp  COMMENTS:       PROCEDURE: DXR - DXR PORTABLE CHEST SINGLE VIEW  - May 20 2014  2:36PM     CLINICAL DATA:  Chest pain    EXAM:  PORTABLE CHEST - 1 VIEW    COMPARISON:  04/23/2014    FINDINGS:  Cardiomediastinal silhouette is stable. No acute infiltrate or  pleural effusion. No pulmonary edema. Bony thorax is unremarkable.   IMPRESSION:  No active disease.      Electronically Signed    By: LLahoma CrockerM.D.    On: 05/20/2014 14:39         Verified By: LEphraim Hamburger M.D.,   Pertinent Past History:  Pertinent Past History Diabetes CKD   Hospital Course:  Hospital Course 69year old lady with h/o diabetes and CKD admitted with unstable angina. No acute changes on EKG. Serial troponins were negative. No significant disease on cardiac cath done yesterday, cardiac meds were continued. CKD: creatinine iincreased to 2.1 after cath, improved to 1.75 today with i.v hydration. Patient received her usual dose of insulin and sliding scale coverage fro diabetes. Plan is to discharge home. Repeat BMP as out patient in one week.   Condition on Discharge Stable   DISCHARGE INSTRUCTIONS HOME  MEDS:  Medication Reconciliation: Patient's Home Medications at Discharge:     Medication Instructions  amlodipine 5 mg oral tablet  1 tab(s) orally once a day   pioglitazone 15 mg oral tablet  1 tab(s) orally once a day   nitroglycerin 0.4 mg sublingual tablet  1 tab(s) sublingual every 5 minutes, As Needed - for Chest Pain   acetaminophen 500 mg oral tablet  1 tab(s) orally every 6 hours, As Needed - for Pain   aspirin enteric coated 81 mg oral delayed release tablet  1 tab(s) orally once a day   atorvastatin 40 mg oral tablet  1 tab(s) orally once a day (at bedtime)   humalog 100 units/ml subcutaneous solution  15 unit(s) subcutaneous once a day (in the evening) before dinner   lisinopril 20 mg oral tablet  1 tab(s) orally 2 times a day    loratadine 10 mg oral tablet  1 tab(s) orally once a day   magnesium oxide 400 mg oral tablet  1 tab(s) orally 2 times a day   metoprolol tartrate 50 mg oral tablet  1 tab(s) orally 2 times a day   oyster shell calcium with vitamin d 500 mg-200 intl units oral tablet  1 tab(s) orally once a day   prednisone 5 mg oral tablet  1 tab(s) orally once a day   vitamin d3 1000 intl units oral capsule  1 cap(s) orally once a day   humalog 100 units/ml subcutaneous solution  10 unit(s) subcutaneous once a day (in the morning) before breakfast   humalog 100 units/ml subcutaneous solution  12 unit(s) subcutaneous once a day before lunch   lantus 100 units/ml subcutaneous solution  25 unit(s) subcutaneous once a day (at bedtime)   hydrochlorothiazide 25 mg oral tablet  1 tab(s) orally once a day   pantoprazole 40 mg oral delayed release tablet  40 milligram(s) orally once a day    STOP TAKING THE FOLLOWING MEDICATION(S):    hydrochlorothiazide 25 mg oral tablet: 1 tab(s) orally once a day (in the morning)  calcium oyster shell  500 mg oral tablet: 1 tab(s) orally once a day  humalog 17units breakfast, 12units lunch, 18units dinner prilosec otc 20 mg oral delayed release tablet: 1 tab(s) orally twice a day for 1 week, then once daily omeprazole 20 mg oral delayed release capsule: 1 cap(s) orally once a day  Physician's Instructions:  Diet Low Sodium  Low Fat, Low Cholesterol  Carbohydrate Controlled (ADA) Diet  Renal Diet   Activity Limitations As tolerated   Return to Work Not Applicable   Time frame for Follow Up Appointment 1-2 weeks  [facility] cardiology   Time frame for Follow Up Appointment 1-2 weeks  Dr. Dwyane Luo, Kimani Bedoya(Attending Physician): [facility], [address], [place], [place] [address], [place]   Bartholome Bill A(Consultant): [facility], [address], [place], [place] [address], [place] [phone]  Electronic Signatures: Glendon Axe (MD)   (Signed 29-Jul-15 13:47)  Authored: ADMISSION DATE AND DIAGNOSIS, CHIEF COMPLAINT/HPI, Allergies, PERTINENT LABS, PERTINENT RADIOLOGY STUDIES, PERTINENT PAST HISTORY, HOSPITAL COURSE, DISCHARGE INSTRUCTIONS HOME MEDS, PATIENT INSTRUCTIONS, Follow Up Physician   Last Updated: 29-Jul-15 13:47 by Glendon Axe (MD)

## 2015-02-15 NOTE — H&P (Signed)
PATIENT NAME:  [name], [name] MR#:  [id number] DATE OF BIRTH:  [birth year]  DATE OF ADMISSION:  02/15/2014    REFERRING PHYSICIAN:  Dr. Beather Arbour.   CHIEF COMPLAINT: Heartburn associated with bilateral shoulder pain and tingling and jaw pain.   HISTORY OF PRESENT ILLNESS: The patient is a 69 year old African American female with past medical history of coronary artery disease, sees Dr. Saralyn Pilar as an outpatient, last cardiac catheter done in the year 2012 showing 50% stenosis in the mid left circumflex and 50% stenosis of the PDA, recent cardiac stress test in October 2014, which was negative, getting medical management regarding her coronary artery disease, history of diabetes mellitus, hypertension, hyperlipidemia, and most probably chronic renal insufficiency, is presenting to the ER with a chief complaint of heartburn, shoulder discomfort and jaw pain.  The patient is reporting that the symptoms started at around 11:00 p.m. last night. Heartburn is not going away, associated with  shoulder discomfort, she has called her daughter who lives close by. Daughter has called EMS and the patient is brought into the ER. Initial set of troponins are negative and EKG has revealed no acute changes. The ER physician has ordered aspirin and therapeutic dose of Lovenox for unstable angina. During my examination the patient is chest pain-free. Denies any complaints. Heartburn is resolved and shoulder discomfort is also resolved. Denies any shortness of breath. She was initially having heart burn, lightheadedness and nausea but no vomiting. Denies any shortness of breath. She sees Dr. Saralyn Pilar on a regular basis and compliant with her medications. No other complaints. Daughter at bedside.   PAST MEDICAL HISTORY: Hyperlipidemia, coronary artery disease, insulin-dependent diabetes mellitus, hypertension, probably chronic renal insufficiency as her renal function is abnormal in the past few years.   PAST SURGICAL HISTORY:  Eye surgery, hysterectomy, rotator cuff surgery.   ALLERGIES: CIPRO, LEVAQUIN AND PENICILLIN AND SULFA DRUGS.   PSYCHOSOCIAL HISTORY: Lives with husband and takes care of her husband as he has Alzheimer's disease. Quit smoking approximately 45 years ago. Denies alcohol or illicit drug usage.   FAMILY HISTORY: Father had myocardial infarction at age 59.   HOME MEDICATIONS: Vitamin D3 400 international units 1 tablet p.o. once daily,  Prilosec 20 mg once daily, nitroglycerin 0.4 mg sublingually as needed, lisinopril 20 mg 2 times a day, lisinopril 20 mg once daily in a.m., Lantus 36 units subcutaneously once daily, hydrochlorothiazide 20 mg once daily, Claritin 10 mg once a day, aspirin 81 mg once daily, amlodipine 5 mg once daily.    REVIEW OF SYSTEMS: CONSTITUTIONAL: Denies any fever, fatigue, chills, rigors, weight gain or weight loss.  EYES: Denies blurry vision, double vision, recently had eye surgery. Denies inflammation, glaucoma.  ENT: Denies epistaxis, discharge, ear pain, hearing loss.  RESPIRATION: Denies cough, chronic obstructive pulmonary disease, wheezing, dyspnea.  CARDIOVASCULAR: Had a heartburn kind of chest discomfort associated with shoulder discomfort and jaw pain. Denies any orthopnea, edema, syncope.  GASTROINTESTINAL: Denies nausea, vomiting, diarrhea, abdominal pain.  GENITOURINARY: No dysuria, hematuria, renal colic.  ENDOCRINE: Has chronic history of insulin-dependent diabetes mellitus, but denies any polyuria, nocturia, thyroid problems. Denies any heat or cold intolerance.  LYMPHATIC:  No anemia, easy bruising, bleeding.  INTEGUMENTARY: No acne, rash, lesions.  MUSCULOSKELETAL: No joint pain in the neck or shoulder. Denies cramps.  NEURO: Denies any history of transient ischemic attack, headache, vertigo.  PSYCHIATRIC: No ADD, OCD.   PHYSICAL EXAMINATION: VITAL SIGNS: Temperature 98.7, pulse 70, respirations 18, blood pressure 181/78, pulse oximetry is  97% on room  air.  GENERAL APPEARANCE: Not in any acute distress. Moderately built and nourished.  HEENT: Normocephalic, atraumatic. Pupils are equally reacting to light and accommodation. No scleral icterus. No conjunctival injection. No sinus tenderness. No postnasal drip. Dry mucous membranes.  NECK:  Supple. No JVD. No thyromegaly. Range of motion is intact.   LUNGS: Clear to auscultation bilaterally. No accessory muscle use and no anterior chest wall tenderness on palpation.  CARDIAC: S1, S2 normal. Regular rate and rhythm without murmurs. No gallops.  GASTROINTESTINAL: Soft. Bowel sounds are positive in all four quadrants. Nontender, nondistended. No hepatosplenomegaly. No masses felt.  NEUROLOGIC: Awake, alert, oriented x3. Motor and sensory grossly intact. Reflexes are 2+.  EXTREMITIES: No edema. No cyanosis. No clubbing.  SKIN: Warm to touch. Normal turgour. No rashes. No lesions.  MUSCULOSKELETAL: No joint effusion, tenderness, or erythema.  PSYCHIATRIC: Normal mood and affect.   LABORATORIES AND IMAGING STUDIES: EKG normal sinus rhythm at 78 beats per minute. septal infarct, no acute ST-T wave changes. Accu-Chek 299. Chem-8 is revealing glucose 251, BUN 35, creatinine 1.75. It looks like the patient has chronic renal insufficiency, baseline is at around 1.56. Sodium and potassium are normal. Chloride is normal. Anion gap is 4. GFR 29. Serum osmolality and calcium are normal. Troponin less than 0.02. CBC: WBC 10.9, hemoglobin 11.1, hematocrit 35.1, platelets are normal. The patient had stress test done in October 2014, which was negative according to the old records. Chest x-ray, portable: Cardiomegaly without decompensation.   ASSESSMENT AND PLAN: A 69 year old African American female presenting to the ER with a chief complaint of heartburn, bilateral shoulder pain and jaw pain started at 11:00 p.m. last night, completely resolved while she is in the ER, will be admitted with the following assessment  and plan.  1. Chest pain with a past medical history of coronary artery disease. We will admit her to telemetry. Lovenox 1 mg/kg subcutaneous x1 was given in the emergency room . Continue cycling cardiac biomarkers. If they are trending up, will consider continuing Lovenox. Cardiology consult is placed to Dr. Saralyn Pilar. The patient will be on aspirin, beta blocker and statin.  2. Acute kidney injury, possible chronic kidney disease. We will provide her IV fluids and hold ACE inhibitor and hydrochlorothiazide. While doing so will increase the dose of her amlodipine to 10 mg.  3. Insulin-dependent diabetes mellitus. Continue Lantus and insulin sliding scale.  4. Hypertension. Continue beta blocker. Hold off on ACE inhibitor and hydrochlorothiazide in view of acute on chronic kidney insufficiency.  5. We will provide gastrointestinal and deep vein thrombosis prophylaxis.   The diagnosis and plan of care was discussed in detail with the patient and her daughter at bedside. They both verbalized understanding of the plan. Total time spent on admission is 50 minutes. Transfer the patient to Dr. Iva Boop in a.m.    ____________________________ Nicholes Mango, MD ag:kb D: 02/15/2014 02:26:13 ET T: 02/15/2014 05:21:52 ET JOB#: [id number]  cc: Nicholes Mango, MD, <Dictator> Nicholes Mango MD ELECTRONICALLY SIGNED 02/27/2014 7:40

## 2015-02-15 NOTE — H&P (Signed)
PATIENT NAME:  [name], MENDEL MR#:  [id number] DATE OF BIRTH:  07-01-1946  DATE OF ADMISSION:  05/20/2014  PRIMARY CARE PHYSICIAN:  Dr. Glendon Axe.  REQUESTING PHYSICIAN:  Dr. Conni Slipper.   CHIEF COMPLAINT:  Chest pain.   HISTORY OF PRESENT ILLNESS: The patient is a 69 year old female with a known history of hypertension, hyperlipidemia and diabetes, is being admitted for unstable angina. The patient has been having intermittent chest pain for the last 2 to 3 days. This morning, around 9:00 when she was making breakfast, she started having heavy feeling all over her body, followed by both of her jaws hurting and heavy chest, which lasted about 20 to 30 minutes, got better on its own. She went to check on her husband at nursing home to do some paperwork at around 11:00, when she started again having similar pressure and decided to call EMS, who brought her here to the Emergency Department. While in the ED, she was still having some chest pressure, and she is being admitted for further evaluation and management.   PAST MEDICAL HISTORY: 1.  Hyperlipidemia.  2.  Coronary artery disease.  3.  Diabetes.   PAST SURGICAL HISTORY:   1.  Eye surgery.  2.  Hysterectomy.  3.  Rotator cuff surgery.   ALLERGIES: CIPRO, LEVAQUIN, PENICILLIN AND SULFA.   SOCIAL HISTORY: She takes care of her husband, who has Alzheimer disease, trying to get nursing home.  She is a former smoker, 45 pack-years. No alcohol. No drug use.   FAMILY HISTORY: Father with MI at the age of 12.   MEDICATIONS AT HOME: 1.  Amlodipine 5 mg p.o. daily.  2.  Aspirin 81 mg p.o. b.i.d.  3.  Atorvastatin 20 mg p.o. at bedtime.  4.  Calcium 500 mg once daily.  5.  Claritin 10 mg p.o. daily.  6.  Humalog 17 units before breakfast, 12 units at lunch, 18 units at dinner.  7.  Hydrochlorothiazide  25 mg p.o. daily.  8.  Insulin Lantus 36 units subQ at bedtime.  9.  Lisinopril 20 mg p.o. b.i.d.  10.  Metoprolol 25 mg p.o. b.i.d.   11.  Nitroglycerin 0.4 mg sublingual every 5 minutes as needed.  12.  Pioglitazone 15 mg p.o. daily.  13.  Prilosec OTC 20 mg p.o. daily. 14.  Tramadol 50 mg p.o. every 4 hours.  15.  Vitamin D3, 400 international units once daily.   REVIEW OF SYSTEMS:  CONSTITUTIONAL: No fever, fatigue, weakness.  EYES: No blurred or double vision.  EAR, NOSE, THROAT: No tinnitus, ear pain.  RESPIRATORY: No cough, wheezing, hemoptysis.  CARDIOVASCULAR: Positive for chest pain, orthopnea, edema.  GASTROINTESTINAL: No nausea, vomiting, diarrhea.  GENITOURINARY:  No dysuria or hematuria. ENDOCRINE:  No polyuria, nocturia. HEMATOLOGIC:  No anemia or easy bruising.  SKIN: No rash or lesion.  MUSCULOSKELETAL: No arthritis or muscle cramp.  NEUROLOGIC: No tingling, numbness, weakness.  PSYCHIATRIC: No history of anxiety, depression.   PHYSICAL EXAMINATION: VITAL SIGNS: Temperature 98.1, heart rate 64 per minute, respirations 18 per minute, blood pressure 170/78 mmHg. She is saturating 98% on room air.  GENERAL: The patient is a 69 year old female lying in the bed comfortably without any acute distress.  EYES: Pupils equal, round and reactive to light and accommodation. No scleral icterus. Extraocular muscles intact.  HEENT: Head atraumatic, normocephalic. Oropharynx and nasopharynx clear.  NECK: Supple. No jugular venous distention. No thyroid enlargement or tenderness.  LUNGS: Clear to auscultation bilaterally. No wheezing,  rales, rhonchi or crepitation.  CARDIOVASCULAR: S1, S2 normal. No murmur, rub or gallop.  ABDOMEN: Soft, nontender, nondistended. Bowel sounds present. No organomegaly or mass.  EXTREMITIES: No pedal edema, cyanosis or clubbing.  NEUROLOGIC: Cranial nerves II through XII intact. Muscle strength 5/5 in extremities . Sensation intact.  PSYCHIATRIC: The patient is alert and oriented x 3.  SKIN:  No obvious rash, lesion or ulcer.   LABORATORY PANEL:  Normal BMP, except BUN of 39,  creatinine 1.87, potassium  of 3.4. Normal liver function tests. Normal first set of troponins. Normal CBC except white count of 14.5, hemoglobin 11.4, platelet count of 146.   Chest x-ray in the ED showed no acute cardiopulmonary disease.   EKG shows no acute ST-T changes.   IMPRESSION AND PLAN: 1.  Unstable angina. We will consult cardiology. Her cardiac enzyme is negative. EKG does not look any acute changes. Likely, we will need a cath, considering her typical anginal symptoms. She does have a history of cardiac cath in the past, although she denies any stenting.  2.  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 Will need a minimal dye load/contrast with her cardiac cath.  3.  Hypokalemia. We will replete and recheck. Check magnesium.  4.  Leukocytosis, likely due to stress. We will hold off any antibiotics at this time.  5.  Diabetes. We will continue her medication.   CODE STATUS: FULL CODE.   Total time taking care of this patient is 45 minutes.     ____________________________ Lucina Mellow. Manuella Ghazi, MD vss:dmm D: 05/20/2014 17:30:52 ET T: 05/20/2014 19:20:26 ET JOB#: [id number]  cc: Jeanluc Wegman S. Manuella Ghazi, MD, <Dictator> Glendon Axe, MD Javier Docker. Ubaldo Glassing, MD Remer Macho MD ELECTRONICALLY SIGNED 05/21/2014 20:42

## 2015-02-25 ENCOUNTER — Other Ambulatory Visit: Payer: PPO

## 2015-02-26 ENCOUNTER — Other Ambulatory Visit: Payer: Self-pay

## 2015-02-26 ENCOUNTER — Encounter: Payer: Self-pay | Admitting: Vascular Surgery

## 2015-02-26 ENCOUNTER — Other Ambulatory Visit: Payer: PPO

## 2015-02-26 DIAGNOSIS — I83029 Varicose veins of left lower extremity with ulcer of unspecified site: Secondary | ICD-10-CM

## 2015-02-26 DIAGNOSIS — L97929 Non-pressure chronic ulcer of unspecified part of left lower leg with unspecified severity: Principal | ICD-10-CM

## 2015-02-27 ENCOUNTER — Other Ambulatory Visit: Payer: Self-pay | Admitting: *Deleted

## 2015-02-27 ENCOUNTER — Encounter
Admission: RE | Admit: 2015-02-27 | Discharge: 2015-02-27 | Disposition: A | Discharge status: Home / Self Care | Payer: PPO | Source: Ambulatory Visit | Attending: Ophthalmology | Admitting: Ophthalmology

## 2015-02-27 ENCOUNTER — Other Ambulatory Visit: Payer: Self-pay

## 2015-02-27 DIAGNOSIS — Z01812 Encounter for preprocedural laboratory examination: Secondary | ICD-10-CM | POA: Insufficient documentation

## 2015-02-27 DIAGNOSIS — I10 Essential (primary) hypertension: Secondary | ICD-10-CM | POA: Insufficient documentation

## 2015-02-27 DIAGNOSIS — Z01810 Encounter for preprocedural cardiovascular examination: Secondary | ICD-10-CM | POA: Diagnosis not present

## 2015-02-27 LAB — POTASSIUM: Potassium: 3.4 mmol/L — ABNORMAL LOW (ref 3.5–5.1)

## 2015-02-28 ENCOUNTER — Encounter: Payer: PPO | Attending: Surgery | Admitting: Surgery

## 2015-02-28 DIAGNOSIS — E1142 Type 2 diabetes mellitus with diabetic polyneuropathy: Secondary | ICD-10-CM | POA: Diagnosis not present

## 2015-02-28 DIAGNOSIS — I129 Hypertensive chronic kidney disease with stage 1 through stage 4 chronic kidney disease, or unspecified chronic kidney disease: Secondary | ICD-10-CM | POA: Diagnosis not present

## 2015-02-28 DIAGNOSIS — I890 Lymphedema, not elsewhere classified: Secondary | ICD-10-CM | POA: Diagnosis not present

## 2015-02-28 DIAGNOSIS — E780 Pure hypercholesterolemia: Secondary | ICD-10-CM | POA: Insufficient documentation

## 2015-02-28 DIAGNOSIS — N189 Chronic kidney disease, unspecified: Secondary | ICD-10-CM | POA: Insufficient documentation

## 2015-02-28 DIAGNOSIS — L97322 Non-pressure chronic ulcer of left ankle with fat layer exposed: Secondary | ICD-10-CM | POA: Insufficient documentation

## 2015-02-28 DIAGNOSIS — E11622 Type 2 diabetes mellitus with other skin ulcer: Secondary | ICD-10-CM | POA: Diagnosis not present

## 2015-02-28 NOTE — Progress Notes (Signed)
RAYA, MCKINSTRY ([id number]) Visit Report for 02/28/2015 Arrival Information Details Patient Name: [name], [name] Date of Service: 02/28/2015 11:00 AM Medical Record Number: [id number] Patient Account Number: [id number] Date of Birth/Sex: 12/31/1945 (68 y.o. Female) Treating RN: [name], RN, [name], [name] Primary Care Physician: Glendon Axe Other Clinician: Referring Physician: Mariana Arn Treating Physician/Extender: Frann Rider in Treatment: 4 Visit Information History Since Last Visit Any new allergies or adverse reactions: No Patient Arrived: [name] Had a fall or experienced change in No Arrival Time: 11:00 activities of daily living that may affect Accompanied By: grnddtr risk of falls: Transfer Assistance: None Signs or symptoms of abuse/neglect since last No Patient Identification Verified: Yes visito Secondary Verification Process Yes Has Dressing in Place as Prescribed: Yes Completed: Pain Present Now: Yes Patient Has Alerts: Yes Patient Alerts: Patient on Blood Thinner asa 81 ABI [place] >220 Electronic Signature(s) Signed: 02/28/2015 1:39:53 PM By: Regan Lemming [name], RN Entered By: Regan Lemming on 02/28/2015 11:01:04 Schram, Shalinda D. ([id number]) -------------------------------------------------------------------------------- Encounter Discharge Information Details Patient Name: [name], [name] Date of Service: 02/28/2015 11:00 AM Medical Record Number: [id number] Patient Account Number: [id number] Date of Birth/Sex: [birth year] (68 y.o. Female) Treating RN: Baruch Gouty, RN, [name], [name] Primary Care Physician: Glendon Axe Other Clinician: Referring Physician: Mariana Arn Treating Physician/Extender: Frann Rider in Treatment: 4 Encounter Discharge Information Items Discharge Pain Level: 0 Discharge Condition: Stable Ambulatory Status: Cane Discharge Destination: Home Transportation: Private Auto Accompanied By: grnd dtr Schedule Follow-up Appointment:  No Medication Reconciliation completed No and provided to Patient/Care Saivion Goettel: Provided on Clinical Summary of Care: 02/28/2015 Form Type Recipient Paper Patient LM Electronic Signature(s) Signed: 02/28/2015 1:39:53 PM By: Regan Lemming [name], RN Previous Signature: 02/28/2015 11:39:07 AM Version By: Ruthine Dose Entered By: Regan Lemming on 02/28/2015 11:39:52 Plott, Prairie D. ([id number]) -------------------------------------------------------------------------------- Lower Extremity Assessment Details Patient Name: Kristin Stare D. Date of Service: 02/28/2015 11:00 AM Medical Record Number: [id number] Patient Account Number: [id number] Date of Birth/Sex: 01-16-1946 (68 y.o. Female) Treating RN: [name], RN, [name], [employer] Primary Care Physician: Glendon Axe Other Clinician: Referring Physician: Mariana Arn Treating Physician/Extender: Frann Rider in Treatment: 4 Edema Assessment Assessed: [Left: No] [Right: No] E[Left: dema] [Right: :] Calf Left: Right: Point of Measurement: 33 cm From Medial Instep 37.7 cm cm Ankle Left: Right: Point of Measurement: 12 cm From Medial Instep 22.4 cm cm Vascular Assessment Claudication: Claudication Assessment [Left:None] Pulses: Posterior Tibial Dorsalis Pedis Palpable: [Left:Yes] Extremity colors, hair growth, and conditions: Extremity Color: [Left:Mottled] Hair Growth on Extremity: [Left:Yes] Temperature of Extremity: [Left:Warm] Capillary Refill: [Left:< 3 seconds] Dependent Rubor: [Left:No] Blanched when Elevated: [Left:No] Lipodermatosclerosis: [Left:No] Toe Nail Assessment Left: Right: Thick: No Discolored: No Deformed: No Improper Length and Hygiene: No [name], [name] ([id number]) Electronic Signature(s) Signed: 02/28/2015 1:39:53 PM By: Regan Lemming [name], RN Entered By: Regan Lemming on 02/28/2015 11:06:45 [name], [name] ([id number]) -------------------------------------------------------------------------------- Multi Wound  Chart Details Patient Name: Kristin Stare D. Date of Service: 02/28/2015 11:00 AM Medical Record Number: [id number] Patient Account Number: [id number] Date of Birth/Sex: [birth year] (68 y.o. Female) Treating RN: [name] Primary Care Physician: Glendon Axe Other Clinician: Referring Physician: Mariana Arn Treating Physician/Extender: Frann Rider in Treatment: 4 Vital Signs Height(in): 65 Pulse(bpm): 63 Weight(lbs): 248 Blood Pressure 116/101 (mmHg): Body Mass Index(BMI): 41 Temperature(F): 97.5 Respiratory Rate 16 (breaths/min): Photos: [1:No Photos] [2:No Photos] [3:No Photos] Wound Location: [1:Left Lower Leg - Proximal] [2:Left Lower Leg - Distal] [3:Left Lower Leg - Medial] Wounding Event: [1:Blister] [2:Blister] [3:Blister] Primary Etiology: [1:Venous  Leg Ulcer] [2:Venous Leg Ulcer] [3:Venous Leg Ulcer] Comorbid History: [1:Cataracts, Asthma, Coronary Artery Disease, Hypertension, Type II Diabetes, Osteoarthritis, Neuropathy] [2:Cataracts, Asthma, Coronary Artery Disease, Hypertension, Type II Diabetes, Osteoarthritis, Neuropathy] [3:Cataracts, Asthma,  Coronary Artery Disease, Hypertension, Type II Diabetes, Osteoarthritis, Neuropathy] Date Acquired: [1:12/30/2014] [2:12/30/2014] [3:12/30/2014] Weeks of Treatment: [1:4] [2:4] [3:4] Wound Status: [1:Open] [2:Open] [3:Open] Measurements L x W x D 1.7x0.4x0.1 [2:0.8x1.5x0.1] [3:2x1x0.1] (cm) Area (cm) : [1:0.534] [2:0.942] [3:1.571] Volume (cm) : [1:0.053] [2:0.094] [3:0.157] % Reduction in Area: [1:10.60%] [2:-33.20%] [3:-565.70%] % Reduction in Volume: 11.70% [2:-32.40%] [3:-554.20%] Classification: [1:Unclassifiable] [2:Unclassifiable] [3:Unclassifiable] HBO Classification: [1:Grade 0] [2:Grade 0] [3:Grade 0] Exudate Amount: [1:None Present] [2:None Present] [3:None Present] Wound Margin: [1:Flat and Intact] [2:Flat and Intact] [3:Flat and Intact] Granulation Amount: [1:None Present (0%)] [2:None Present (0%)]  [3:None Present (0%)] Necrotic Amount: [1:Large (67-100%)] [2:Large (67-100%)] [3:Large (67-100%)] Necrotic Tissue: [1:Eschar] [2:Eschar] [3:Eschar] Exposed Structures: [1:Fascia: No Fat: No Tendon: No Muscle: No Joint: No] [2:Fascia: No Fat: No Tendon: No Muscle: No Joint: No] [3:Fascia: No Fat: No Tendon: No Muscle: No Joint: No] Bone: No Bone: No Bone: No Limited to Skin Limited to Skin Limited to Skin Breakdown Breakdown Breakdown Epithelialization: Small (1-33%) None None Periwound Skin Texture: Edema: No Edema: No Edema: No Excoriation: No Excoriation: No Excoriation: No Induration: No Induration: No Induration: No Callus: No Callus: No Callus: No Crepitus: No Crepitus: No Crepitus: No Fluctuance: No Fluctuance: No Fluctuance: No Friable: No Friable: No Friable: No Rash: No Rash: No Rash: No Scarring: No Scarring: No Scarring: No Periwound Skin Dry/Scaly: Yes Dry/Scaly: Yes Dry/Scaly: Yes Moisture: Maceration: No Maceration: No Maceration: No Moist: No Moist: No Moist: No Periwound Skin Color: Atrophie Blanche: No Atrophie Blanche: No Atrophie Blanche: No Cyanosis: No Cyanosis: No Cyanosis: No Ecchymosis: No Ecchymosis: No Ecchymosis: No Erythema: No Erythema: No Erythema: No Hemosiderin Staining: No Hemosiderin Staining: No Hemosiderin Staining: No Mottled: No Mottled: No Mottled: No Pallor: No Pallor: No Pallor: No Rubor: No Rubor: No Rubor: No Temperature: No Abnormality No Abnormality No Abnormality Tenderness on Yes No No Palpation: Wound Preparation: Ulcer Cleansing: Ulcer Cleansing: Other: Ulcer Cleansing: Rinsed/Irrigated with soap water Rinsed/Irrigated with Saline Saline Topical Anesthetic Topical Anesthetic Applied: Other: lidocaine Topical Anesthetic Applied: Other: lidocaine 4% Applied: Other: lidocaine 4% 4% Wound Number: 4 N/A N/A Photos: No Photos N/A N/A Wound Location: Left Lower Leg - Posterior N/A  N/A Wounding Event: Blister N/A N/A Primary Etiology: Venous Leg Ulcer N/A N/A Comorbid History: Cataracts, Asthma, N/A N/A Coronary Artery Disease, Hypertension, Type II Diabetes, Osteoarthritis, Neuropathy Date Acquired: 12/30/2014 N/A N/A Weeks of Treatment: 4 N/A N/A Wound Status: Open N/A N/A Measurements L x W x D 0.8x0.4x0.1 N/A N/A (cm) [name], [name] ([id number]) Area (cm) : 0.251 N/A N/A Volume (cm) : 0.025 N/A N/A % Reduction in Area: 23.90% N/A N/A % Reduction in Volume: 24.20% N/A N/A Classification: Unclassifiable N/A N/A HBO Classification: Grade 0 N/A N/A Exudate Amount: None Present N/A N/A Wound Margin: Flat and Intact N/A N/A Granulation Amount: None Present (0%) N/A N/A Necrotic Amount: Large (67-100%) N/A N/A Necrotic Tissue: Eschar N/A N/A Exposed Structures: Fascia: No N/A N/A Fat: No Tendon: No Muscle: No Joint: No Bone: No Limited to Skin Breakdown Epithelialization: None N/A N/A Periwound Skin Texture: Edema: No N/A N/A Excoriation: No Induration: No Callus: No Crepitus: No Fluctuance: No Friable: No Rash: No Scarring: No Periwound Skin Dry/Scaly: Yes N/A N/A Moisture: Maceration: No Moist: No Periwound Skin Color: Hemosiderin Staining: Yes N/A N/A  Atrophie Blanche: No Cyanosis: No Ecchymosis: No Erythema: No Mottled: No Pallor: No Rubor: No Temperature: No Abnormality N/A N/A Tenderness on No N/A N/A Palpation: Wound Preparation: Ulcer Cleansing: N/A N/A Rinsed/Irrigated with Saline Topical Anesthetic Applied: Other: lidocaine 4% [name], [name] ([id number]) Treatment Notes Electronic Signature(s) Signed: 02/28/2015 2:43:41 PM By: [name], RN, [name], [name], [name] Entered By: [name], RN, [name], Kim on 02/28/2015 11:16:25 [name], [name] ([id number]) -------------------------------------------------------------------------------- Multi-Disciplinary Care Plan Details Patient Name: KADY, TOOTHAKER D. Date of Service: 02/28/2015 11:00  AM Medical Record Number: [id number] Patient Account Number: [id number] Date of Birth/Sex: [birth year] (68 y.o. Female) Treating RN: [name] Primary Care Physician: Glendon Axe Other Clinician: Referring Physician: Mariana Arn Treating Physician/Extender: Frann Rider in Treatment: 4 Active Inactive Orientation to the Wound Care Program Nursing Diagnoses: Knowledge deficit related to the wound healing center program Goals: Patient/caregiver will verbalize understanding of the [place] Program Date Initiated: 01/31/2015 Goal Status: Active Interventions: Provide education on orientation to the wound center Notes: Venous Leg Ulcer Nursing Diagnoses: Potential for venous Insuffiency (use before diagnosis confirmed) Goals: Non-invasive venous studies are completed as ordered Date Initiated: 01/31/2015 Goal Status: Active Patient/caregiver will verbalize understanding of disease process and disease management Date Initiated: 01/31/2015 Goal Status: Active Interventions: Assess peripheral edema status every visit. Notes: Wound/Skin Impairment Nursing Diagnoses: Impaired tissue integrity Knowledge deficit related to smoking impact on wound healing Harston, Romona D. ([id number]) Goals: Patient/caregiver will verbalize understanding of skin care regimen Date Initiated: 01/31/2015 Goal Status: Active Ulcer/skin breakdown will heal within 14 weeks Date Initiated: 01/31/2015 Goal Status: Active Interventions: Assess ulceration(s) every visit Notes: Electronic Signature(s) Signed: 02/28/2015 2:43:41 PM By: [name], RN, [name], [name], [name] Entered By: [name], RN, [name], Kim on 02/28/2015 11:16:17 [name], [name] D. ([id number]) -------------------------------------------------------------------------------- Pain Assessment Details Patient Name: [name], [name] Date of Service: 02/28/2015 11:00 AM Medical Record Number: [id number] Patient Account Number: [id number] Date of  Birth/Sex: 08-12-1946 (68 y.o. Female) Treating RN: Baruch Gouty, RN, [name], [name] Primary Care Physician: Glendon Axe Other Clinician: Referring Physician: Mariana Arn Treating Physician/Extender: Frann Rider in Treatment: 4 Active Problems Location of Pain Severity and Description of Pain Patient Has Paino Yes Site Locations Pain Location: Pain in Ulcers Rate the pain. Current Pain Level: 5 Pain Management and Medication Current Pain Management: Medication: Yes Massage: Yes Rest: Yes T.E.N.S.: Yes Activity: Yes Leg drop or elevation: Yes Is the Current Pain Management Adequate Adequate: How does your pain impact your activities of daily livingo Sleep: Yes Bathing: Yes Appetite: Yes Relationship With Others: Yes Bladder Continence: Yes Emotions: Yes Bowel Continence: Yes Work: Yes Toileting: Yes Drive: Yes Dressing: Yes Hobbies: Yes Electronic Signature(s) Signed: 02/28/2015 1:39:53 PM By: Regan Lemming [name], RN Entered By: Regan Lemming on 02/28/2015 11:01:26 [name], [name] ([id number]) -------------------------------------------------------------------------------- Patient/Caregiver Education Details Patient Name: Kristin Stare D. Date of Service: 02/28/2015 11:00 AM Medical Record Number: [id number] Patient Account Number: [id number] Date of Birth/Gender: 1946-04-21 (68 y.o. Female) Treating RN: Baruch Gouty, RN, [name], [name] Primary Care Physician: Glendon Axe Other Clinician: Referring Physician: Mariana Arn Treating Physician/Extender: Frann Rider in Treatment: 4 Education Assessment Education Provided To: Patient Education Topics Provided Basic Hygiene: Methods: Explain/Verbal Responses: State content correctly Wound/Skin Impairment: Methods: Explain/Verbal Responses: State content correctly Electronic Signature(s) Signed: 02/28/2015 1:39:53 PM By: Regan Lemming [name], RN Entered By: Regan Lemming on 02/28/2015 11:40:08 [name], [name]  ([id number]) -------------------------------------------------------------------------------- Wound Assessment Details Patient Name: Kristin Stare D. Date of Service: 02/28/2015 11:00 AM Medical Record Number: [id number]  Patient Account Number: [id number] Date of Birth/Sex: [birth year] (68 y.o. Female) Treating RN: [name], RN, [name], [place] Primary Care Physician: Glendon Axe Other Clinician: Referring Physician: Mariana Arn Treating Physician/Extender: Frann Rider in Treatment: 4 Wound Status Wound Number: 1 Primary Venous Leg Ulcer Etiology: Wound Location: Left Lower Leg - Proximal Wound Open Wounding Event: Blister Status: Date Acquired: 12/30/2014 Comorbid Cataracts, Asthma, Coronary Artery Weeks Of Treatment: 4 History: Disease, Hypertension, Type II Clustered Wound: No Diabetes, Osteoarthritis, Neuropathy Photos Photo Uploaded By: Regan Lemming on 02/28/2015 12:22:52 Wound Measurements Length: (cm) 1.7 Width: (cm) 0.4 Depth: (cm) 0.1 Area: (cm) 0.534 Volume: (cm) 0.053 % Reduction in Area: 10.6% % Reduction in Volume: 11.7% Epithelialization: Small (1-33%) Tunneling: No Undermining: No Wound Description Classification: Unclassifiable Diabetic Severity [name]): Grade 0 Wound Margin: Flat and Intact Exudate Amount: None Present Foul Odor After Cleansing: No Wound Bed Granulation Amount: None Present (0%) Exposed Structure Necrotic Amount: Large (67-100%) Fascia Exposed: No Necrotic Quality: Eschar Fat Layer Exposed: No Tendon Exposed: No Muscle Exposed: No Celmer, Shadiyah D. ([id number]) Joint Exposed: No Bone Exposed: No Limited to Skin Breakdown Periwound Skin Texture Texture Color No Abnormalities Noted: No No Abnormalities Noted: No Callus: No Atrophie Blanche: No Crepitus: No Cyanosis: No Excoriation: No Ecchymosis: No Fluctuance: No Erythema: No Friable: No Hemosiderin Staining: No Induration: No Mottled: No Localized Edema:  No Pallor: No Rash: No Rubor: No Scarring: No Temperature / Pain Moisture Temperature: No Abnormality No Abnormalities Noted: No Tenderness on Palpation: Yes Dry / Scaly: Yes Maceration: No Moist: No Wound Preparation Ulcer Cleansing: Rinsed/Irrigated with Saline Topical Anesthetic Applied: Other: lidocaine 4%, Treatment Notes Wound #1 (Left, Proximal Lower Leg) 1. Cleansed with: Clean wound with Normal Saline 4. Dressing Applied: Iodosorb Ointment 5. Secondary Dressing Applied Gauze and Kerlix/Conform 7. Secured with 2 Layer Lite Compression System - Left Lower Extremity Electronic Signature(s) Signed: 02/28/2015 1:39:53 PM By: Regan Lemming [name], RN Entered By: Regan Lemming on 02/28/2015 11:10:47 Casanova, Hadassah D. ([id number]) -------------------------------------------------------------------------------- Wound Assessment Details Patient Name: DANYALE, RIDINGER D. Date of Service: 02/28/2015 11:00 AM Medical Record Number: [id number] Patient Account Number: [id number] Date of Birth/Sex: 01/21/1946 (68 y.o. Female) Treating RN: [name] Primary Care Physician: Glendon Axe Other Clinician: Referring Physician: Mariana Arn Treating Physician/Extender: Frann Rider in Treatment: 4 Wound Status Wound Number: 2 Primary Venous Leg Ulcer Etiology: Wound Location: Left Lower Leg - Distal Wound Open Wounding Event: Blister Status: Date Acquired: 12/30/2014 Comorbid Cataracts, Asthma, Coronary Artery Weeks Of Treatment: 4 History: Disease, Hypertension, Type II Clustered Wound: Yes Diabetes, Osteoarthritis, Neuropathy Photos Photo Uploaded By: Regan Lemming on 02/28/2015 12:22:52 Wound Measurements Length: (cm) 6.5 Width: (cm) 1.5 Depth: (cm) 0.1 Area: (cm) 7.658 Volume: (cm) 0.766 % Reduction in Area: -983.2% % Reduction in Volume: -978.9% Epithelialization: None Tunneling: No Undermining: No Wound Description Full Thickness Without Classification: Exposed  Support Structures Diabetic Severity Grade 1 (Wagner): Wound Margin: Flat and Intact Exudate Amount: None Present Foul Odor After Cleansing: No Wound Bed Granulation Amount: Small (1-33%) Exposed Structure Granulation Quality: Pink Fascia Exposed: No Necrotic Amount: Large (67-100%) Fat Layer Exposed: No Spearman, Arlina D. ([id number]) Necrotic Quality: Eschar Tendon Exposed: No Muscle Exposed: No Joint Exposed: No Bone Exposed: No Limited to Skin Breakdown Periwound Skin Texture Texture Color No Abnormalities Noted: No No Abnormalities Noted: No Callus: No Atrophie Blanche: No Crepitus: No Cyanosis: No Excoriation: No Ecchymosis: No Fluctuance: No Erythema: No Friable: No Hemosiderin Staining: No Induration: No Mottled: No Localized Edema: No Pallor: No Rash:  No Rubor: No Scarring: No Temperature / Pain Moisture Temperature: No Abnormality No Abnormalities Noted: No Dry / Scaly: Yes Maceration: No Moist: No Wound Preparation Ulcer Cleansing: Other: soap water, Topical Anesthetic Applied: Other: lidocaine 4%, Treatment Notes Wound #2 (Left, Distal Lower Leg) 1. Cleansed with: Clean wound with Normal Saline 4. Dressing Applied: Iodosorb Ointment 5. Secondary Dressing Applied Gauze and Kerlix/Conform 7. Secured with 2 Layer Lite Compression System - Left Lower Extremity Electronic Signature(s) Signed: 02/28/2015 2:43:41 PM By: [name], RN, [name], [name], [name] Entered By: [name], RN, [name], Kim on 02/28/2015 11:26:28 Odor, [name] ([id number]) -------------------------------------------------------------------------------- Wound Assessment Details Patient Name: RICHARDINE, PEPPERS D. Date of Service: 02/28/2015 11:00 AM Medical Record Number: [id number] Patient Account Number: [id number] Date of Birth/Sex: [birth year] (68 y.o. Female) Treating RN: [name], RN, [name], [place] Primary Care Physician: Glendon Axe Other Clinician: Referring Physician: Mariana Arn Treating  Physician/Extender: Frann Rider in Treatment: 4 Wound Status Wound Number: 3 Primary Venous Leg Ulcer Etiology: Wound Location: Left Lower Leg - Medial Wound Open Wounding Event: Blister Status: Date Acquired: 12/30/2014 Comorbid Cataracts, Asthma, Coronary Artery Weeks Of Treatment: 4 History: Disease, Hypertension, Type II Clustered Wound: No Diabetes, Osteoarthritis, Neuropathy Photos Photo Uploaded By: Regan Lemming on 02/28/2015 12:22:53 Wound Measurements Length: (cm) 2 Width: (cm) 1 Depth: (cm) 0.1 Area: (cm) 1.571 Volume: (cm) 0.157 % Reduction in Area: -565.7% % Reduction in Volume: -554.2% Epithelialization: None Tunneling: No Undermining: No Wound Description Classification: Unclassifiable Diabetic Severity [name]): Grade 0 Wound Margin: Flat and Intact Exudate Amount: None Present Foul Odor After Cleansing: No Wound Bed Granulation Amount: None Present (0%) Exposed Structure Necrotic Amount: Large (67-100%) Fascia Exposed: No Necrotic Quality: Eschar Fat Layer Exposed: No Tendon Exposed: No Muscle Exposed: No Pepperman, Safiyya D. ([id number]) Joint Exposed: No Bone Exposed: No Limited to Skin Breakdown Periwound Skin Texture Texture Color No Abnormalities Noted: No No Abnormalities Noted: No Callus: No Atrophie Blanche: No Crepitus: No Cyanosis: No Excoriation: No Ecchymosis: No Fluctuance: No Erythema: No Friable: No Hemosiderin Staining: No Induration: No Mottled: No Localized Edema: No Pallor: No Rash: No Rubor: No Scarring: No Temperature / Pain Moisture Temperature: No Abnormality No Abnormalities Noted: No Dry / Scaly: Yes Maceration: No Moist: No Wound Preparation Ulcer Cleansing: Rinsed/Irrigated with Saline Topical Anesthetic Applied: Other: lidocaine 4%, Electronic Signature(s) Signed: 02/28/2015 1:39:53 PM By: Regan Lemming [name], RN Entered By: Regan Lemming on 02/28/2015 11:11:25 Zaugg, Sadae D.  ([id number]) -------------------------------------------------------------------------------- Wound Assessment Details Patient Name: LEILANNY, FLUITT D. Date of Service: 02/28/2015 11:00 AM Medical Record Number: [id number] Patient Account Number: [id number] Date of Birth/Sex: 1946-08-01 (68 y.o. Female) Treating RN: [name], RN, [name], [place] Primary Care Physician: Glendon Axe Other Clinician: Referring Physician: Mariana Arn Treating Physician/Extender: Frann Rider in Treatment: 4 Wound Status Wound Number: 4 Primary Venous Leg Ulcer Etiology: Wound Location: Left Lower Leg - Posterior Wound Open Wounding Event: Blister Status: Date Acquired: 12/30/2014 Comorbid Cataracts, Asthma, Coronary Artery Weeks Of Treatment: 4 History: Disease, Hypertension, Type II Clustered Wound: No Diabetes, Osteoarthritis, Neuropathy Photos Photo Uploaded By: Regan Lemming on 02/28/2015 12:23:20 Wound Measurements Length: (cm) 0.8 Width: (cm) 0.4 Depth: (cm) 0.1 Area: (cm) 0.251 Volume: (cm) 0.025 % Reduction in Area: 23.9% % Reduction in Volume: 24.2% Epithelialization: None Tunneling: No Undermining: No Wound Description Classification: Unclassifiable Foul Odor A Diabetic Severity (Wagner): Grade 0 Wound Margin: Flat and Intact Exudate Amount: None Present fter Cleansing: No Wound Bed Granulation Amount: None Present (0%) Exposed Structure Necrotic Amount: Large (67-100%) Fascia Exposed:  No Necrotic Quality: Eschar Fat Layer Exposed: No Tendon Exposed: No Muscle Exposed: No Tworek, Hubert D. ([id number]) Joint Exposed: No Bone Exposed: No Limited to Skin Breakdown Periwound Skin Texture Texture Color No Abnormalities Noted: No No Abnormalities Noted: No Callus: No Atrophie Blanche: No Crepitus: No Cyanosis: No Excoriation: No Ecchymosis: No Fluctuance: No Erythema: No Friable: No Hemosiderin Staining: Yes Induration: No Mottled: No Localized Edema: No Pallor:  No Rash: No Rubor: No Scarring: No Temperature / Pain Moisture Temperature: No Abnormality No Abnormalities Noted: No Dry / Scaly: Yes Maceration: No Moist: No Wound Preparation Ulcer Cleansing: Rinsed/Irrigated with Saline Topical Anesthetic Applied: Other: lidocaine 4%, Treatment Notes Wound #4 (Left, Posterior Lower Leg) 1. Cleansed with: Clean wound with Normal Saline 4. Dressing Applied: Iodosorb Ointment 5. Secondary Dressing Applied Gauze and Kerlix/Conform 7. Secured with 2 Layer Lite Compression System - Left Lower Extremity Electronic Signature(s) Signed: 02/28/2015 1:39:53 PM By: Regan Lemming [name], RN Entered By: Regan Lemming on 02/28/2015 11:12:17 Danker, Yatzary D. ([id number]) -------------------------------------------------------------------------------- Vitals Details Patient Name: Kristin Stare D. Date of Service: 02/28/2015 11:00 AM Medical Record Number: [id number] Patient Account Number: [id number] Date of Birth/Sex: 1946-01-19 (68 y.o. Female) Treating RN: [name], RN, [name], [place] Primary Care Physician: Glendon Axe Other Clinician: Referring Physician: Mariana Arn Treating Physician/Extender: Frann Rider in Treatment: 4 Vital Signs Time Taken: 11:01 Temperature (F): 97.5 Height (in): 65 Pulse (bpm): 63 Weight (lbs): 248 Respiratory Rate (breaths/min): 16 Body Mass Index (BMI): 41.3 Blood Pressure (mmHg): 116/101 Reference Range: 80 - 120 mg / dl Electronic Signature(s) Signed: 02/28/2015 1:39:53 PM By: Regan Lemming [name], RN Entered By: Regan Lemming on 02/28/2015 11:06:05

## 2015-02-28 NOTE — Progress Notes (Signed)
LAVADA, LANGSAM ([id number]) Visit Report for 02/28/2015 Chief Complaint Document Details Patient Name: [name], [name] 02/28/2015 11:00 Date of Service: AM Medical Record [id number] Number: Patient Account Number: [id number] 1946/01/14 (69 y.o. Treating RN: Date of Birth/Sex: Female) Other Clinician: Primary Care Physician: [facility], [name] Treating Lamiah Marmol Referring Physician: Mariana Arn Physician/Extender: Weeks in Treatment: 4 Information Obtained from: Patient Chief Complaint Patient presents to the wound care center for a consult due non healing wound 69 year old patient comes with a history of having a ulcer on the left lower extremity for the past 4 weeks. she says she's had swelling of both lower extremities for about a year after she started having prednisone. 02/07/2015 -- her vascular appointments obtained were in the first and third week of June. she is able to go to [place] and we will try and get her some earlier appointments. Other than that nothing else has changed in her management. Electronic Signature(s) Signed: 02/28/2015 12:08:58 PM By: [name], [name] Entered By: [name] on 02/28/2015 11:31:37 Grimsley, FRANCHESKA VILLEDA ([id number]) -------------------------------------------------------------------------------- Debridement Details Patient Name: [name], [name] 02/28/2015 11:00 Date of Service: AM Medical Record [id number] Number: Patient Account Number: [id number] October 10, 1946 (69 y.o. Treating RN: Date of Birth/Sex: Female) Other Clinician: Primary Care Physician: [facility], [name] Treating Oluwatomisin Deman Referring Physician: Mariana Arn Physician/Extender: Weeks in Treatment: 4 Debridement Performed for Wound #1 Left,Proximal Lower Leg Assessment: Performed By: Physician Pat Patrick., MD Debridement: Open Wound/Selective Debridement Selective Description: Pre-procedure Yes Verification/Time Out Taken: Start Time: 11:15 Pain Control:  Lidocaine 5% topical ointment Level: Skin/Dermis Total Area Debrided (L x 1.7 (cm) x 0.4 (cm) = 0.68 (cm) W): Tissue and other Non-Viable, Fibrin/Slough, Skin material debrided: Instrument: Forceps Bleeding: None End Time: 11:20 Procedural Pain: 7 Post Procedural Pain: 0 Response to Treatment: Procedure was tolerated well Post Debridement Measurements of Total Wound Length: (cm) 1.7 Width: (cm) 0.4 Depth: (cm) 0.1 Volume: (cm) 0.053 Electronic Signature(s) Signed: 02/28/2015 12:08:58 PM By: [name], [name] Entered By: [name] on 02/28/2015 11:30:13 Bokhari, Tia Masker ([id number]) -------------------------------------------------------------------------------- Debridement Details Patient Name: Kristin Stare D. 02/28/2015 11:00 Date of Service: AM Medical Record [id number] Number: Patient Account Number: [id number] 1946/01/14 (69 y.o. Treating RN: Date of Birth/Sex: Female) Other Clinician: Primary Care Physician: [facility], [name] Treating Katerine Morua Referring Physician: Mariana Arn Physician/Extender: Weeks in Treatment: 4 Debridement Performed for Wound #2 Left,Distal Lower Leg Assessment: Performed By: Physician Pat Patrick., MD Debridement: Open Wound/Selective Debridement Selective Description: Pre-procedure Yes Verification/Time Out Taken: Start Time: 11:15 Pain Control: Lidocaine 5% topical ointment Level: Skin/Dermis Total Area Debrided (L x 6 (cm) x 1 (cm) = 6 (cm) W): Tissue and other Non-Viable, Fibrin/Slough, Skin material debrided: Instrument: Forceps Bleeding: None End Time: 11:20 Procedural Pain: 7 Post Procedural Pain: 0 Response to Treatment: Procedure was tolerated well Post Debridement Measurements of Total Wound Length: (cm) 6.5 Width: (cm) 1.5 Depth: (cm) 0.1 Volume: (cm) 0.766 Electronic Signature(s) Signed: 02/28/2015 12:08:58 PM By: [name], [name] Entered By: [name] on 02/28/2015 11:30:57 Sizer,  Tia Masker ([id number]) -------------------------------------------------------------------------------- Debridement Details Patient Name: [name], [name] 02/28/2015 11:00 Date of Service: AM Medical Record [id number] Number: Patient Account Number: [id number] 07/05/1946 (69 y.o. Treating RN: Date of Birth/Sex: Female) Other Clinician: Primary Care Physician: [facility], [name] Treating Donnah Levert Referring Physician: Mariana Arn Physician/Extender: Suella Grove in Treatment: 4 Debridement Performed for Wound #4 Left,Posterior Lower Leg Assessment: Performed By: Physician Pat Patrick., MD Debridement: Open Wound/Selective Debridement Selective Description: Pre-procedure Yes Verification/Time  Out Taken: Start Time: 11:15 Pain Control: Lidocaine 5% topical ointment Level: Skin/Dermis Total Area Debrided (L x 0.8 (cm) x 0.4 (cm) = 0.32 (cm) W): Tissue and other Non-Viable, Fibrin/Slough, Skin material debrided: Instrument: Forceps Bleeding: None End Time: 11:20 Procedural Pain: 7 Post Procedural Pain: 0 Response to Treatment: Procedure was tolerated well Post Debridement Measurements of Total Wound Length: (cm) 0.8 Width: (cm) 0.4 Depth: (cm) 0.1 Volume: (cm) 0.025 Electronic Signature(s) Signed: 02/28/2015 12:08:58 PM By: [name], [name] Entered By: [name] on 02/28/2015 11:31:28 Fauble, Tia Masker ([id number]) -------------------------------------------------------------------------------- HPI Details Patient Name: [name], [name] 02/28/2015 11:00 Date of Service: AM Medical Record [id number] Number: Patient Account Number: [id number] [birth year] (69 y.o. Treating RN: Date of Birth/Sex: Female) Other Clinician: Primary Care Physician: [facility], [name] Treating Sephora Boyar Referring Physician: Mariana Arn Physician/Extender: Weeks in Treatment: 4 History of Present Illness HPI Description: 69 year old patient who is known to have diabetes mellitus type 2,  chronic renal insufficiency, coronary artery disease, hypertension, hypercholesterolemia, temporal arteritis and inflammatory arthritiss also has a history of having a hysterectomy and some orthopedic related surgeries. The ulcer on the left lower extremity started off as a blister and then. Got progressively worse. She does not have any fever or chills and has not had any recent surgical intervention for this. Her last hemoglobin A1c was 10.1 in September 2015. She has been recently put on doxycycline by her PCP. She is now also allergic to doxycycline and was this was changed over to Keflex. 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 She does see a cardiologist and also takes a diuretic. 02/07/2015 her arterial and venous duplex studies to be done have dates been given as the first and third week of June. This is at [facility]. We are going to try and get early appointments at [facility]. other than that nothing has changed in her management. 02/14/2015 -- we have been able to get her an appointment in [facility] on May 20 which is much earlier than her previous ones at [place]. She continues with her prednisone and her sugars are in the range of 150-200. 02/21/2015 We were able to get a vascular lab workup for her today and she is going to be there at 2:00 this afternoon. the swelling of her leg has gone down significantly but she still has some tenderness over the wounds. 02/28/2015 - She has had one of two vascular workups done, and this coming Tuesday has another, at [place] [facility]. She continues to be on steroid medications. She has significant sensitivity in her left lower extremity and has pain suggestive of neuropathic pain and I have asked her to address this with her primary care physician. Electronic Signature(s) Signed: 02/28/2015 12:08:58 PM By:  [name], [name] Entered By: [name] on 02/28/2015 11:33:44 Lupton, ESPERANZA MADRAZO ([id number]) -------------------------------------------------------------------------------- Physical Exam Details Patient Name: NEIDRA, [name] 02/28/2015 11:00 Date of Service: AM Medical Record [id number] Number: Patient Account Number: [id number] 26-Mar-1946 (69 y.o. Treating RN: Date of Birth/Sex: Female) Other Clinician: Primary Care Physician: [facility], [place] Referring Physician: Mariana Arn Physician/Extender: Weeks in Treatment: 4 Constitutional . Pulse regular. Respirations normal and unlabored. Afebrile. . Eyes Nonicteric. Reactive to light. Ears, Nose, Mouth, and Throat Lips, teeth, and gums WNL.Marland Kitchen Moist mucosa without lesions . Neck supple and nontender. No palpable supraclavicular or cervical adenopathy. Normal  sized without goiter. Respiratory WNL. No retractions.. Breath sounds WNL, No rubs, rales, rhonchi, or wheeze.. Cardiovascular Pedal Pulses WNL. her edema has gone down significantly and she has +1 pitting edema of the left lower extremity.. Integumentary (Hair, Skin) the eye resolved seems to be helping the ulcerations a bit better not being able to debride some of it with a tooth forcep.Marland Kitchen Psychiatric Judgement and insight Intact.. No evidence of depression, anxiety, or agitation.. Electronic Signature(s) Signed: 02/28/2015 12:08:58 PM By: [name], [name] Entered By: [name] on 02/28/2015 11:34:42 Wichman, REAGYN FACEMIRE ([id number]) -------------------------------------------------------------------------------- Physician Orders Details Patient Name: MARKAN, CAZAREZ D. 02/28/2015 11:00 Date of Service: AM Medical Record [id number] Number: Patient Account Number: [id number] 1946/09/23 (69 y.o. Treating RN: [name] Date of Birth/Sex: Female) Other Clinician: Primary Care Physician: [facility], Delana Meyer Treating Chaniqua Brisby Referring Physician:  Mariana Arn Physician/Extender: Suella Grove in Treatment: 4 Verbal / Phone Orders: Yes Clinician: [name] Read Back and Verified: Yes Diagnosis Coding Wound Cleansing Wound #1 Left,Proximal Lower Leg o Cleanse wound with mild soap and water Wound #2 Left,Distal Lower Leg o Cleanse wound with mild soap and water Wound #4 Left,Posterior Lower Leg o Cleanse wound with mild soap and water Anesthetic Wound #1 Left,Proximal Lower Leg o Topical Lidocaine 4% cream applied to wound bed prior to debridement Wound #2 Left,Distal Lower Leg o Topical Lidocaine 4% cream applied to wound bed prior to debridement Wound #4 Left,Posterior Lower Leg o Topical Lidocaine 4% cream applied to wound bed prior to debridement Primary Wound Dressing Wound #1 Left,Proximal Lower Leg o Iodosorb Ointment Wound #2 Left,Distal Lower Leg o Iodosorb Ointment Wound #4 Left,Posterior Lower Leg o Iodosorb Ointment Secondary Dressing Wound #1 Left,Proximal Lower Leg o ABD pad Wound #2 Left,Distal Lower Leg Postel, Layann D. ([id number]) o ABD pad Wound #4 Left,Posterior Lower Leg o ABD pad Dressing Change Frequency Wound #1 Left,Proximal Lower Leg o Change dressing every week Wound #2 Left,Distal Lower Leg o Change dressing every week Wound #4 Left,Posterior Lower Leg o Change dressing every week Follow-up Appointments Wound #1 Left,Proximal Lower Leg o Return Appointment in 1 week. o Nurse Visit as needed - To apply wrap after vascular appt. Wound #2 Left,Distal Lower Leg o Return Appointment in 1 week. o Nurse Visit as needed - To apply wrap after vascular appt. Wound #4 Left,Posterior Lower Leg o Return Appointment in 1 week. o Nurse Visit as needed - To apply wrap after vascular appt. Edema Control Wound #1 Left,Proximal Lower Leg o 2 Layer Lite Compression System - Left Lower Extremity Wound #2 Left,Distal Lower Leg o 2 Layer Lite Compression System -  Left Lower Extremity Wound #4 Left,Posterior Lower Leg o 2 Layer Lite Compression System - Left Lower Extremity Electronic Signature(s) Signed: 02/28/2015 12:08:58 PM By: [name], [name] Signed: 02/28/2015 2:43:41 PM By: [name] RN, [name], [name], [name] Entered By: [name], RN, [name], Kim on 02/28/2015 11:28:55 ARRIE, BORRELLI ([id number]) -------------------------------------------------------------------------------- Problem List Details Patient Name: [name], [name] 02/28/2015 11:00 Date of Service: AM Medical Record [id number] Number: Patient Account Number: [id number] 1946/09/08 (69 y.o. 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 02/28/2015 11:00 Date of Service: AM Medical Record [id number] Number: Patient Account Number: [id number] 04-13-1946 (69 y.o. Treating RN: Date of Birth/Sex: Female) Other Clinician: Primary Care Physician: [facility], [place] Treating Tawsha Terrero Referring Physician: Mariana Arn Physician/Extender: Weeks in Treatment: 4 Subjective Chief Complaint Information obtained from Patient Patient presents to the wound care center for a  consult due non healing wound 69 year old patient comes with a history of having a ulcer on the left lower extremity for the past 4 weeks. she says she's had swelling of both lower extremities for about a year after she started having prednisone. 02/07/2015 -- her vascular appointments obtained were in the first and third week of June. she is able to go to [place] and we will try and get her some earlier appointments. Other than that nothing else has changed in her management. History of Present Illness (HPI) 69 year old patient who is known to have diabetes mellitus type 2, chronic renal insufficiency, coronary artery disease, hypertension, hypercholesterolemia, temporal arteritis and inflammatory arthritiss also has a history of having a hysterectomy and some orthopedic related surgeries. The ulcer on the left lower extremity started off as a blister and then. Got progressively worse. She does not have any fever or chills and has not had any recent surgical intervention for this. Her last hemoglobin A1c was 10.1 in September 2015. She has been recently put on doxycycline by her PCP. She is now also allergic to doxycycline and was this was changed over to Keflex. 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 She does see a cardiologist and also takes a diuretic. 02/07/2015 her arterial and venous duplex studies to be done have dates been given as the first and third week of June. This is at [facility]. We are going to try and get early appointments at [facility]. other than that nothing has changed in her management. 02/14/2015 -- we have been able to get her an appointment in [facility] on May 20 which is much earlier than her previous ones at [place]. She continues with her prednisone and her sugars are in the range of 150-200. 02/21/2015 We were able to get a vascular lab  workup for her today and she is going to be there at 2:00 this afternoon. the swelling of her leg has gone down significantly but she still has some tenderness over the wounds. 02/28/2015 - She has had one of two vascular workups done, and this coming Tuesday has another, at [place] [facility]. She continues to be on steroid medications. She has significant sensitivity in her left lower extremity and has pain suggestive of neuropathic pain and I have asked her to address this with her primary care physician. [name], [name] D. ([id number]) Objective Constitutional Pulse regular. Respirations normal and unlabored. Afebrile. Vitals Time Taken: 11:01 AM, Height: 65 in, Weight: 248 lbs, BMI: 41.3, Temperature: 97.5 F, Pulse: 63 bpm, Respiratory Rate: 16 breaths/min, Blood Pressure: 116/101 mmHg. Eyes Nonicteric. Reactive to light. Ears, Nose, Mouth, and Throat Lips, teeth, and gums WNL.Marland Kitchen Moist mucosa without lesions . Neck supple and nontender. No palpable supraclavicular or cervical adenopathy. Normal sized without goiter. Respiratory WNL. No retractions.Marland Kitchen  Breath sounds WNL, No rubs, rales, rhonchi, or wheeze.. Cardiovascular Pedal Pulses WNL. her edema has gone down significantly and she has +1 pitting edema of the left lower extremity.Marland Kitchen Psychiatric Judgement and insight Intact.. No evidence of depression, anxiety, or agitation.. Integumentary (Hair, Skin) the eye resolved seems to be helping the ulcerations a bit better not being able to debride some of it with a tooth forcep.. Wound #1 status is Open. Original cause of wound was Blister. The wound is located on the Left,Proximal Lower Leg. The wound measures 1.7cm length x 0.4cm width x 0.1cm depth; 0.534cm^2 area and 0.053cm^3 volume. The wound is limited to skin breakdown. There is no tunneling or undermining noted. There is a none present amount of drainage noted. The wound margin is flat and intact. There is  no granulation within the wound bed. There is a large (67-100%) amount of necrotic tissue within the wound bed including Eschar. The periwound skin appearance exhibited: Dry/Scaly. The periwound skin appearance did not exhibit: Callus, Crepitus, Excoriation, Fluctuance, Friable, Induration, Localized Edema, Rash, Scarring, Maceration, Moist, Atrophie Blanche, Cyanosis, Ecchymosis, Hemosiderin Staining, Mottled, Pallor, Rubor, Erythema. Periwound temperature was noted as No Abnormality. The periwound has tenderness on palpation. Wound #2 status is Open. Original cause of wound was Blister. The wound is located on the Left,Distal Lower Leg. The wound measures 6.5cm length x 1.5cm width x 0.1cm depth; 7.658cm^2 area and Loftus, [name] ([id number]) 0.766cm^3 volume. The wound is limited to skin breakdown. There is no tunneling or undermining noted. There is a none present amount of drainage noted. The wound margin is flat and intact. There is small (1-33%) pink granulation within the wound bed. There is a large (67-100%) amount of necrotic tissue within the wound bed including Eschar. The periwound skin appearance exhibited: Dry/Scaly. The periwound skin appearance did not exhibit: Callus, Crepitus, Excoriation, Fluctuance, Friable, Induration, Localized Edema, Rash, Scarring, Maceration, Moist, Atrophie Blanche, Cyanosis, Ecchymosis, Hemosiderin Staining, Mottled, Pallor, Rubor, Erythema. Periwound temperature was noted as No Abnormality. Wound #3 status is Open. Original cause of wound was Blister. The wound is located on the Left,Medial Lower Leg. The wound measures 2cm length x 1cm width x 0.1cm depth; 1.571cm^2 area and 0.157cm^3 volume. The wound is limited to skin breakdown. There is no tunneling or undermining noted. There is a none present amount of drainage noted. The wound margin is flat and intact. There is no granulation within the wound bed. There is a large (67-100%) amount of  necrotic tissue within the wound bed including Eschar. The periwound skin appearance exhibited: Dry/Scaly. The periwound skin appearance did not exhibit: Callus, Crepitus, Excoriation, Fluctuance, Friable, Induration, Localized Edema, Rash, Scarring, Maceration, Moist, Atrophie Blanche, Cyanosis, Ecchymosis, Hemosiderin Staining, Mottled, Pallor, Rubor, Erythema. Periwound temperature was noted as No Abnormality. Wound #4 status is Open. Original cause of wound was Blister. The wound is located on the Left,Posterior Lower Leg. The wound measures 0.8cm length x 0.4cm width x 0.1cm depth; 0.251cm^2 area and 0.025cm^3 volume. The wound is limited to skin breakdown. There is no tunneling or undermining noted. There is a none present amount of drainage noted. The wound margin is flat and intact. There is no granulation within the wound bed. There is a large (67-100%) amount of necrotic tissue within the wound bed including Eschar. The periwound skin appearance exhibited: Dry/Scaly, Hemosiderin Staining. The periwound skin appearance did not exhibit: Callus, Crepitus, Excoriation, Fluctuance, Friable, Induration, Localized Edema, Rash, Scarring, Maceration, Moist, Atrophie Blanche, Cyanosis, Ecchymosis, Mottled, Pallor, Rubor, Erythema.  Periwound temperature was noted as No Abnormality. 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 The ulcerations are better with iodosorb and we will continue with iodosorb dressing locally. [name], [name] ([id number]) we will also apply a 2 layer light Coban dressing to her lower extremity and encourage elevation of the  limb as much as possible. I have also asked her to talk to her PCP regarding treatment of her neuropathy. She will come back and see [place] next week. Procedures Wound #1 Wound #1 is a Venous Leg Ulcer located on the Left,Proximal Lower Leg . There was a Skin/Dermis Open Wound/Selective [phone]) debridement with total area of 0.68 sq cm performed by Layton Naves, Jackson Latino., MD. with the following instrument(s): Forceps to remove Non-Viable tissue/material including Fibrin/Slough and Skin after achieving pain control using Lidocaine 5% topical ointment. A time out was conducted prior to the start of the procedure. There was no bleeding. The procedure was tolerated well with a pain level of 7 throughout and a pain level of 0 following the procedure. Post Debridement Measurements: 1.7cm length x 0.4cm width x 0.1cm depth; 0.053cm^3 volume. Wound #2 Wound #2 is a Venous Leg Ulcer located on the Left,Distal Lower Leg . There was a Skin/Dermis Open Wound/Selective [phone]) debridement with total area of 6 sq cm performed by Neldon Shepard, Jackson Latino., MD. with the following instrument(s): Forceps to remove Non-Viable tissue/material including Fibrin/Slough and Skin after achieving pain control using Lidocaine 5% topical ointment. A time out was conducted prior to the start of the procedure. There was no bleeding. The procedure was tolerated well with a pain level of 7 throughout and a pain level of 0 following the procedure. Post Debridement Measurements: 6.5cm length x 1.5cm width x 0.1cm depth; 0.766cm^3 volume. Wound #4 Wound #4 is a Venous Leg Ulcer located on the Left,Posterior Lower Leg . There was a Skin/Dermis Open Wound/Selective [phone] debridement with total area of 0.32 sq cm performed by Mikkel Charrette, Jackson Latino., MD. with the following instrument(s): Forceps to remove Non-Viable tissue/material including Fibrin/Slough and Skin after achieving pain control using Lidocaine 5% topical ointment. A  time out was conducted prior to the start of the procedure. There was no bleeding. The procedure was tolerated well with a pain level of 7 throughout and a pain level of 0 following the procedure. Post Debridement Measurements: 0.8cm length x 0.4cm width x 0.1cm depth; 0.025cm^3 volume. Plan Wound Cleansing: Wound #1 Left,Proximal Lower Leg: Cleanse wound with mild soap and water Wound #2 Left,Distal Lower Leg: Cleanse wound with mild soap and water Wound #4 Left,Posterior Lower Leg: Cleanse wound with mild soap and water Phebus, Miku D. ([id number]) Anesthetic: Wound #1 Left,Proximal Lower Leg: Topical Lidocaine 4% cream applied to wound bed prior to debridement Wound #2 Left,Distal Lower Leg: Topical Lidocaine 4% cream applied to wound bed prior to debridement Wound #4 Left,Posterior Lower Leg: Topical Lidocaine 4% cream applied to wound bed prior to debridement Primary Wound Dressing: Wound #1 Left,Proximal Lower Leg: Iodosorb Ointment Wound #2 Left,Distal Lower Leg: Iodosorb Ointment Wound #4 Left,Posterior Lower Leg: Iodosorb Ointment Secondary Dressing: Wound #1 Left,Proximal Lower Leg: ABD pad Wound #2 Left,Distal Lower Leg: ABD pad Wound #4 Left,Posterior  Lower Leg: ABD pad Dressing Change Frequency: Wound #1 Left,Proximal Lower Leg: Change dressing every week Wound #2 Left,Distal Lower Leg: Change dressing every week Wound #4 Left,Posterior Lower Leg: Change dressing every week Follow-up Appointments: Wound #1 Left,Proximal Lower Leg: Return Appointment in 1 week. Nurse Visit as needed - To apply wrap after vascular appt. Wound #2 Left,Distal Lower Leg: Return Appointment in 1 week. Nurse Visit as needed - To apply wrap after vascular appt. Wound #4 Left,Posterior Lower Leg: Return Appointment in 1 week. Nurse Visit as needed - To apply wrap after vascular appt. Edema Control: Wound #1 Left,Proximal Lower Leg: 2 Layer Lite Compression System - Left Lower  Extremity Wound #2 Left,Distal Lower Leg: 2 Layer Lite Compression System - Left Lower Extremity Wound #4 Left,Posterior Lower Leg: 2 Layer Lite Compression System - Left Lower Extremity Kleve, Unnamed D. ([id number]) Overall there is much improvement with her lymphedema but we are awaiting her vascular studies before we can use more compression. The ulcerations are better with iodosorb and we will continue with iodosorb dressing locally. we will also apply a 2 layer light Coban dressing to her lower extremity and encourage elevation of the limb as much as possible. I have also asked her to talk to her PCP regarding treatment of her neuropathy. She will come back and see [place] next week. Electronic Signature(s) Signed: 02/28/2015 12:08:58 PM By: [name], [name] Entered By: [name] on 02/28/2015 11:37:23

## 2015-03-07 ENCOUNTER — Encounter: Payer: PPO | Admitting: Surgery

## 2015-03-07 DIAGNOSIS — L97322 Non-pressure chronic ulcer of left ankle with fat layer exposed: Secondary | ICD-10-CM | POA: Diagnosis not present

## 2015-03-07 NOTE — Progress Notes (Addendum)
MAILIN, COGLIANESE ([id number]) Visit Report for 03/07/2015 Chief Complaint Document Details Patient Name: [name], [name] 03/07/2015 10:15 Date of Service: AM Medical Record [id number] Number: Patient Account Number: [id number] 07-Apr-1946 (69 y.o. Treating RN: Date of Birth/Sex: Female) Other Clinician: Primary Care Physician: [name], [name] Treating [name] Referring Physician: Glendon [name] Physician/Extender: [name] in Treatment: 5 Information Obtained from: Patient Chief Complaint Patient presents to the wound care [name] for a consult due non healing wound 69 year old patient comes with a history of having a ulcer on the left lower extremity for the past 4 [name] she says she's had swelling of both lower extremities for about a year after she started having prednisone. 02/07/2015 -- her vascular appointments obtained were in the first and third week of June. she is able to go to [place] and we will try and get her some earlier appointments. Other than that nothing else has changed in her management. Electronic Signature(s) Signed: 03/07/2015 12:47:20 PM By: Kristin Fudge MD, [name] Entered By: [name] on 03/07/2015 11:07:42 [name] ([id number]) -------------------------------------------------------------------------------- HPI Details Patient Name: [name], [name] 03/07/2015 10:15 Date of Service: AM Medical Record [id number] Number: Patient Account Number: [id number] 01/24/1946 (68 y.o. Treating RN: Date of Birth/Sex: Female) Other Clinician: Primary Care Physician: [facility], [name] Treating [name] Referring Physician: Glendon [name] Physician/Extender: [name] in Treatment: 5 History of Present Illness HPI Description: 69 year old patient who is known to have diabetes mellitus type 2, chronic renal insufficiency, coronary artery disease, hypertension, hypercholesterolemia, temporal arteritis and inflammatory arthritiss also has a history of having  a hysterectomy and some orthopedic related surgeries. The ulcer on the left lower extremity started off as a blister and then. Got progressively worse. She does not have any fever or chills and has not had any recent surgical intervention for this. Her last hemoglobin A1c was 10.1 in September 2015. She has been recently put on doxycycline by her PCP. She is now also allergic to doxycycline and was this was changed over to Keflex. 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 She does see a cardiologist and also takes a diuretic. 02/07/2015 her arterial and venous duplex studies to be done have dates been given as the first and third week of June. This is at [name] We are going to try and get early appointments at [name] other than that nothing has changed in her management. 02/14/2015 -- we have been able to get her an appointment in [name] on May 20 which is much earlier than her previous ones at [name] She continues with her prednisone and her sugars are in the range of 150-200. 02/21/2015 We were able to get a vascular lab workup for her today and she is going to be there at 2:00 this afternoon. the swelling of her leg has gone down significantly but she still has some tenderness over the wounds. 02/28/2015 - She has had one of two vascular workups done, and this coming Tuesday has another, at [facility]. She continues to be on steroid medications. She has significant sensitivity in her left lower extremity and has pain suggestive of neuropathic pain and I have asked her to address this with her primary care physician. 03/07/2015 -- the patient saw Dr. 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 Official report  is awaited. Addendum: official reports are now available and on 03/04/2015 she was seen in a lower axillary venous duplex exam was done. There was reflux present within the left greater saphenous vein below the knee and also the left small saphenous vein. Arterial duplex showed normal triphasic waveforms throughout the left lower extremity without any significant stenosis. Her ABIs were noncompressible bilaterally but a waveforms were normal and a digital pressures were normal bilaterally consistent with no significant arterial insufficiency. He has recommended endovenous ablation of both the left small saphenous and the left great saphenous vein. This would still be scheduled later. [name], [name] ([id number]) Electronic Signature(s) Signed: 03/07/2015 12:47:20 PM By: Kristin Fudge MD, [name] Entered By: [name] on 03/07/2015 11:33:46 [name] ([id number]) -------------------------------------------------------------------------------- Physical Exam Details Patient Name: [name], [name] 03/07/2015 10:15 Date of Service: AM Medical Record [id number] Number: Patient Account Number: [id number] 03-03-1946 (69 y.o. Treating RN: Date of Birth/Sex: Female) Other Clinician: Primary Care Physician: [name], [name] Treating [name] Referring Physician: Glendon [name] Physician/Extender: [name] in Treatment: 5 Constitutional . Pulse regular. Respirations normal and unlabored. Afebrile. . Eyes Nonicteric. Reactive to light. Ears, Nose, Mouth, and Throat Lips, teeth, and gums WNL.Marland Kitchen Moist mucosa without lesions . Neck supple and nontender. No palpable supraclavicular or cervical adenopathy. Normal sized without goiter. Respiratory WNL. No retractions.. Cardiovascular Pedal Pulses WNL. No clubbing, cyanosis or edema. Integumentary (Hair, Skin) the area of the left lower extremity and ankle have now again been covered with eschar. This is very tender and difficult to debride..  No crepitus or fluctuance. No peri-wound warmth or erythema. No masses.Marland Kitchen Psychiatric Judgement and insight Intact.. No evidence of depression, anxiety, or agitation.. Electronic Signature(s) Signed: 03/07/2015 12:47:20 PM By: Kristin Fudge MD, [name] Entered By: [name] on 03/07/2015 11:09:28 QUINA, WILBOURNE ([id number]) -------------------------------------------------------------------------------- Physician Orders Details Patient Name: [name], [name] 03/07/2015 10:15 Date of Service: AM Medical Record [id number] Number: Patient Account Number: [id number] 1946/05/03 (69 y.o. [name], RN, [name], Date of Birth/Sex: Treating RN: Female) Administrator, sports Primary Care Physician: [name] Other Clinician: Referring Physician: Glendon [name] Treating Keilly Fatula Physician/Extender: [name] in Treatment: 5 Verbal / Phone Orders: Yes Clinician: [name], RN, [name], Rita Read Back and Verified: Yes Diagnosis Coding Wound Cleansing Wound #1 Left,Proximal Lower Leg o Cleanse wound with mild soap and water Wound #2 Left,Distal Lower Leg o Cleanse wound with mild soap and water Wound #4 Left,Posterior Lower Leg o Cleanse wound with mild soap and water Anesthetic Wound #1 Left,Proximal Lower Leg o Topical Lidocaine 4% cream applied to wound bed prior to debridement Wound #2 Left,Distal Lower Leg o Topical Lidocaine 4% cream applied to wound bed prior to debridement Wound #4 Left,Posterior Lower Leg o Topical Lidocaine 4% cream applied to wound bed prior to debridement Primary Wound Dressing Wound #1 Left,Proximal Lower Leg o Acticoat 7 Wound #2 Left,Distal Lower Leg o Acticoat 7 Wound #4 Left,Posterior Lower Leg o Acticoat 7 Secondary Dressing Wound #1 Left,Proximal Lower Leg o ABD pad Wound #2 Left,Distal Lower Leg Grzywacz, Juliauna D. ([id number]) o ABD pad Wound #4 Left,Posterior Lower Leg o ABD pad Dressing Change Frequency Wound #1 Left,Proximal Lower Leg o  Change dressing every week Wound #2 Left,Distal Lower Leg o Change dressing every week Wound #4 Left,Posterior Lower Leg o Change dressing every week Follow-up Appointments Wound #1 Left,Proximal Lower Leg o Return Appointment in 1 week. Wound #2 Left,Distal Lower Leg o Return Appointment  in 1 week. Wound #4 Left,Posterior Lower Leg o Return Appointment in 1 week. Edema Control Wound #1 Left,Proximal Lower Leg o 2 Layer Compression System - Left Lower Extremity Wound #2 Left,Distal Lower Leg o 2 Layer Compression System - Left Lower Extremity Wound #4 Left,Posterior Lower Leg o 2 Layer Compression System - Left Lower Extremity Electronic Signature(s) Signed: 03/07/2015 12:21:26 PM By: Regan Lemming [name], RN Signed: 03/07/2015 12:47:20 PM By: Kristin Fudge MD, [name] Entered By: Regan Lemming on 03/07/2015 11:00:34 Harland, ROSEZETTA BALDERSTON ([id number]) -------------------------------------------------------------------------------- Problem List Details Patient Name: [name], [name] 03/07/2015 10:15 Date of Service: AM Medical Record [id number] Number: Patient Account Number: [id number] [birth year] (69 y.o. 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 03/07/2015 10:15 Date of Service: AM Medical Record [id number] Number: Patient Account Number: [id number] 1946-09-16 (69 y.o. Treating RN: Date of Birth/Sex: Female) Other Clinician: Primary Care Physician: [name], [name] Treating [name] Referring Physician: Glendon [name] Physician/Extender: [name] in Treatment: 5 Subjective Chief Complaint Information obtained from Patient Patient presents to the wound care [name] for a consult due non healing wound 69 year old patient comes with a history of having a ulcer on the left lower extremity for the past 4 [name] she says she's had swelling of both lower extremities for about a year after she started having prednisone. 02/07/2015 -- her vascular appointments obtained were in the first and third week of June. she is able to go to [employer] and we will try and get her some earlier appointments. Other than that nothing else has changed in her management. History of Present Illness (HPI) 69 year old patient who is known to have diabetes mellitus type 2, chronic renal insufficiency, coronary artery disease, hypertension, hypercholesterolemia, temporal arteritis and inflammatory arthritiss also has a history of having a hysterectomy and some orthopedic related surgeries. The ulcer on the left lower extremity started off as a blister and then. Got progressively worse. She does not have any fever or chills and has not had any recent surgical intervention for this. Her last hemoglobin A1c was 10.1 in September 2015. She has been recently put on doxycycline by her PCP. She is now also allergic to doxycycline and was this was changed over to  Keflex. 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 She does see a cardiologist and also takes a diuretic. 02/07/2015 her arterial and venous duplex studies to be done have dates been given as the first and third week of June. This is at [facility]. We are going to try and get early appointments at [name]. other than that nothing has changed in her management. 02/14/2015 --  we have been able to get her an appointment in [place] on May 20 which is much earlier than her previous ones at [place]. She continues with her prednisone and her sugars are in the range of 150-200. 02/21/2015 We were able to get a vascular lab workup for her today and she is going to be there at 2:00 this afternoon. the swelling of her leg has gone down significantly but she still has some tenderness over the wounds. 02/28/2015 - She has had one of two vascular workups done, and this coming Tuesday has another, at [facility]. She continues to be on steroid medications. She has significant sensitivity in her left lower extremity and has pain suggestive of neuropathic pain and I have asked her to address this with her primary care physician. 03/07/2015 -- the patient saw Dr. 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 Official report is [name], [name] ([id number]) awaited. Addendum: official reports are now available and on 03/04/2015 she was seen in a lower axillary venous duplex exam was done. There was reflux present within the left greater saphenous vein below the knee and also the left small saphenous vein. Arterial duplex showed normal triphasic waveforms throughout the left lower extremity without any significant stenosis. Her ABIs were noncompressible  bilaterally but a waveforms were normal and a digital pressures were normal bilaterally consistent with no significant arterial insufficiency. He has recommended endovenous ablation of both the left small saphenous and the left great saphenous vein. This would still be scheduled later. Objective Constitutional Pulse regular. Respirations normal and unlabored. Afebrile. Vitals Time Taken: 10:25 AM, Height: 65 in, Weight: 248 lbs, BMI: 41.3, Temperature: 97.8 F, Pulse: 71 bpm, Respiratory Rate: 16 breaths/min, Blood Pressure: 142/55 mmHg. Eyes Nonicteric. Reactive to light. Ears, Nose, Mouth, and Throat Lips, teeth, and gums WNL.Marland Kitchen Moist mucosa without lesions . Neck supple and nontender. No palpable supraclavicular or cervical adenopathy. Normal sized without goiter. Respiratory WNL. No retractions.. Cardiovascular Pedal Pulses WNL. No clubbing, cyanosis or edema. Psychiatric Judgement and insight Intact.. No evidence of depression, anxiety, or agitation.. Integumentary (Hair, Skin) the area of the left lower extremity and ankle have now again been covered with eschar. This is very tender and difficult to debride.. No crepitus or fluctuance. No peri-wound warmth or erythema. No masses.. Wound #1 status is Open. Original cause of wound was Blister. The wound is located on the Left,Proximal Lower Leg. The wound measures 1cm length x 0.6cm width x 0.1cm depth; 0.471cm^2 area and 0.047cm^3 volume. The wound is limited to skin breakdown. There is no tunneling or undermining noted. [name], [name] ([id number]) There is a none present amount of drainage noted. The wound margin is flat and intact. There is no granulation within the wound bed. There is a large (67-100%) amount of necrotic tissue within the wound bed including Eschar. The periwound skin appearance exhibited: Dry/Scaly. The periwound skin appearance did not exhibit: Callus, Crepitus, Excoriation, Fluctuance, Friable, Induration,  Localized Edema, Rash, Scarring, Maceration, Moist, Atrophie Blanche, Cyanosis, Ecchymosis, Hemosiderin Staining, Mottled, Pallor, Rubor, Erythema. Periwound temperature was noted as No Abnormality. The periwound has tenderness on palpation. Wound #2 status is Open. Original cause of wound was Blister. The wound is located on the Left,Distal Lower Leg. The wound measures 6.8cm length x 2.3cm width x 0.2cm depth; 12.284cm^2 area and 2.457cm^3 volume.  The wound is limited to skin breakdown. There is no tunneling or undermining noted. There is a small amount of drainage noted. The wound margin is flat and intact. There is small (1-33%) pink granulation within the wound bed. There is a large (67-100%) amount of necrotic tissue within the wound bed including Eschar. The periwound skin appearance exhibited: Dry/Scaly, Moist. The periwound skin appearance did not exhibit: Callus, Crepitus, Excoriation, Fluctuance, Friable, Induration, Localized Edema, Rash, Scarring, Maceration, Atrophie Blanche, Cyanosis, Ecchymosis, Hemosiderin Staining, Mottled, Pallor, Rubor, Erythema. Periwound temperature was noted as No Abnormality. Wound #4 status is Open. Original cause of wound was Blister. The wound is located on the Left,Posterior Lower Leg. The wound measures 0.7cm length x 0.4cm width x 0.1cm depth; 0.22cm^2 area and 0.022cm^3 volume. The wound is limited to skin breakdown. There is no tunneling or undermining noted. There is a none present amount of drainage noted. The wound margin is flat and intact. There is no granulation within the wound bed. There is a large (67-100%) amount of necrotic tissue within the wound bed including Eschar. The periwound skin appearance exhibited: Dry/Scaly, Hemosiderin Staining. The periwound skin appearance did not exhibit: Callus, Crepitus, Excoriation, Fluctuance, Friable, Induration, Localized Edema, Rash, Scarring, Maceration, Moist, Atrophie Blanche, Cyanosis,  Ecchymosis, Mottled, Pallor, Rubor, Erythema. Periwound temperature was noted as No Abnormality. 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 Also use Acticoat 7 on her wounds. [name], [name] ([id number]) She understands that this is going to be an ongoing process and the vein ablation will help her. She'll come back and see me next week. Plan Wound Cleansing: Wound #1 Left,Proximal Lower Leg: Cleanse wound with mild soap and water Wound #2 Left,Distal Lower Leg: Cleanse wound with mild soap and water Wound #4 Left,Posterior Lower Leg: Cleanse wound with mild soap and water Anesthetic: Wound #1 Left,Proximal Lower Leg: Topical Lidocaine 4% cream applied to wound bed prior to debridement Wound #2 Left,Distal Lower Leg: Topical Lidocaine 4% cream applied to wound bed prior to debridement Wound #4 Left,Posterior Lower Leg: Topical Lidocaine 4% cream applied to wound bed prior to debridement Primary Wound Dressing: Wound #1 Left,Proximal Lower Leg: Acticoat 7 Wound #2 Left,Distal Lower Leg: Acticoat 7 Wound #4 Left,Posterior Lower Leg: Acticoat 7 Secondary Dressing: Wound #1 Left,Proximal Lower Leg: ABD pad Wound #2 Left,Distal Lower Leg: ABD pad Wound #4 Left,Posterior Lower Leg: ABD pad Dressing Change Frequency: Wound #1 Left,Proximal Lower Leg: Change dressing every week Wound #2 Left,Distal Lower Leg: Change dressing  every week Wound #4 Left,Posterior Lower Leg: Change dressing every week Follow-up Appointments: Wound #1 Left,Proximal Lower Leg: Return Appointment in 1 week. Wound #2 Left,Distal Lower Leg: Return Appointment in 1 week. Wound #4 Left,Posterior Lower Leg: Return Appointment in 1 week. Edema Control: [name], SCHUL. ([id number]) Wound #1 Left,Proximal Lower Leg: 2 Layer Compression System - Left Lower Extremity Wound #2 Left,Distal Lower Leg: 2 Layer Compression System - Left Lower Extremity Wound #4 Left,Posterior Lower Leg:  2 Layer Compression System - Left Lower Extremity Now that we know that her arterial blood supply is normal I will go ahead and use a 3 layer compression system and we would use Coban compression system. Also use Acticoat 7 on her wounds. She understands that this is going to be an ongoing process and the vein ablation will help her. She'll come back and see me next week. Electronic Signature(s) Signed: 03/10/2015 1:04:49 PM By: Kristin Fudge MD, [name] Previous Signature: 03/07/2015 12:47:20 PM Version By: Kristin Fudge MD, [name] Entered By: [name] on 03/10/2015 13:02:21 Mclaren, Allie D. ([id number]) -------------------------------------------------------------------------------- SuperBill Details Patient Name: NIKOLETTE, REINDL D. Date of Service: 03/07/2015 Medical Record Number: [id number] Patient Account Number: [id number] Date of Birth/Sex: 15-Aug-1946 (68 y.o. 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 Facility Procedures CPT4: Description Modifier Quantity Code [id number] (Facility Use Only) [phone] - APPLY [place] [facility] 1 LEG Physician Procedures CPT4: Description Modifier Quantity Code [id number] - WC PHYS LEVEL 3 - EST PT 1 ICD-10 Description Diagnosis E11.622 Type 2 diabetes mellitus with other skin ulcer L97.322 Non-pressure chronic ulcer of left ankle with fat layer exposed I83.222  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

## 2015-03-07 NOTE — Progress Notes (Signed)
CALEIGH, RABELO ([id number]) Visit Report for 03/07/2015 Arrival Information Details Patient Name: [name], [name] Date of Service: 03/07/2015 10:15 AM Medical Record Number: [id number] Patient Account Number: [id number] Date of Birth/Sex: [birth year] (69 y.o. Female) Treating RN: [name], RN, [name], [name] Primary Care Physician: Glendon Axe Other Clinician: Referring Physician: Glendon Axe Treating Physician/Extender: Frann Rider in Treatment: 5 Visit Information History Since Last Visit Any new allergies or adverse reactions: No Patient Arrived: [name] Had a fall or experienced change in No Arrival Time: 10:25 activities of daily living that may affect Accompanied By: URKY DTR risk of falls: Transfer Assistance: None Signs or symptoms of abuse/neglect since last No Patient Identification Verified: Yes visito Secondary Verification Process Yes Hospitalized since last visit: No Completed: Has Dressing in Place as Prescribed: Yes Patient Has Alerts: Yes Pain Present Now: No Patient Alerts: Patient on Blood Thinner asa 81 ABI [employer] >220 Electronic Signature(s) Signed: 03/07/2015 12:21:26 PM By: Regan Lemming [name], RN Entered By: Regan Lemming on 03/07/2015 10:25:45 Rayner, Cyndra D. ([id number]) -------------------------------------------------------------------------------- Encounter Discharge Information Details Patient Name: [name], [name] Date of Service: 03/07/2015 10:15 AM Medical Record Number: [id number] Patient Account Number: [id number] Date of Birth/Sex: 1946-09-10 (69 y.o. Female) Treating RN: Baruch Gouty, RN, [name], [name] Primary Care Physician: Glendon Axe Other Clinician: Referring Physician: Glendon Axe Treating Physician/Extender: Frann Rider in Treatment: 5 Encounter Discharge Information Items Discharge Pain Level: 0 Discharge Condition: Stable Ambulatory Status: Cane Discharge Destination: Home Transportation: Private Auto Accompanied By: grnd  dtr Schedule Follow-up Appointment: No Medication Reconciliation completed No and provided to Patient/Care Kynsley Whitehouse: Provided on Clinical Summary of Care: 03/07/2015 Form Type Recipient Paper Patient LM Electronic Signature(s) Signed: 03/07/2015 12:21:26 PM By: Regan Lemming [name], RN Previous Signature: 03/07/2015 11:12:09 AM Version By: Ruthine Dose Entered By: Regan Lemming on 03/07/2015 11:12:42 [name], [name] ([id number]) -------------------------------------------------------------------------------- Lower Extremity Assessment Details Patient Name: VELECIA, OVITT D. Date of Service: 03/07/2015 10:15 AM Medical Record Number: [id number] Patient Account Number: [id number] Date of Birth/Sex: 1946-09-12 (69 y.o. Female) Treating RN: [name], RN, [name], [employer] Primary Care Physician: Glendon Axe Other Clinician: Referring Physician: Glendon Axe Treating Physician/Extender: Frann Rider in Treatment: 5 Edema Assessment Assessed: [Left: No] [Right: No] E[Left: dema] [Right: :] Calf Left: Right: Point of Measurement: 33 cm From Medial Instep 37.5 cm cm Ankle Left: Right: Point of Measurement: 12 cm From Medial Instep 22 cm cm Vascular Assessment Claudication: Claudication Assessment [Left:None] Pulses: Posterior Tibial Dorsalis Pedis Palpable: [Left:Yes] Extremity colors, hair growth, and conditions: Extremity Color: [Left:Normal] Hair Growth on Extremity: [Left:No] Temperature of Extremity: [Left:Warm] Capillary Refill: [Left:< 3 seconds] Dependent Rubor: [Left:No] Blanched when Elevated: [Left:No] Lipodermatosclerosis: [Left:No] Toe Nail Assessment Left: Right: Thick: No Discolored: No Deformed: No Improper Length and Hygiene: No VERONIA, LAPRISE ([id number]) Electronic Signature(s) Signed: 03/07/2015 12:21:26 PM By: Regan Lemming [name], RN Entered By: Regan Lemming on 03/07/2015 10:31:57 [name], [name]  ([id number]) -------------------------------------------------------------------------------- Multi Wound Chart Details Patient Name: Kristin Stare D. Date of Service: 03/07/2015 10:15 AM Medical Record Number: [id number] Patient Account Number: [id number] Date of Birth/Sex: 28-Feb-1946 (69 y.o. Female) Treating RN: Baruch Gouty, RN, [name], [name] Primary Care Physician: Glendon Axe Other Clinician: Referring Physician: Glendon Axe Treating Physician/Extender: Frann Rider in Treatment: 5 Vital Signs Height(in): 65 Pulse(bpm): 71 Weight(lbs): 248 Blood Pressure 142/55 (mmHg): Body Mass Index(BMI): 41 Temperature(F): 97.8 Respiratory Rate 16 (breaths/min): Photos: [1:No Photos] [2:No Photos] [4:No Photos] Wound Location: [1:Left Lower Leg - Proximal] [2:Left Lower Leg - Distal] [4:Left Lower Leg - Posterior]  Wounding Event: [1:Blister] [2:Blister] [4:Blister] Primary Etiology: [1:Venous Leg Ulcer] [2:Venous Leg Ulcer] [4:Venous Leg Ulcer] Comorbid History: [1:Cataracts, Asthma, Coronary Artery Disease, Hypertension, Type II Diabetes, Osteoarthritis, Neuropathy] [2:Cataracts, Asthma, Coronary Artery Disease, Hypertension, Type II Diabetes, Osteoarthritis, Neuropathy] [4:Cataracts, Asthma,  Coronary Artery Disease, Hypertension, Type II Diabetes, Osteoarthritis, Neuropathy] Date Acquired: [1:12/30/2014] [2:12/30/2014] [4:12/30/2014] Weeks of Treatment: [1:5] [2:5] [4:5] Wound Status: [1:Open] [2:Open] [4:Open] Clustered Wound: [1:No] [2:Yes] [4:No] Measurements L x W x D 1x0.6x0.1 [2:6.8x2.3x0.2] [4:0.7x0.4x0.1] (cm) Area (cm) : [1:0.471] [2:12.284] [4:0.22] Volume (cm) : [1:0.047] [2:2.457] [4:0.022] % Reduction in Area: [1:21.10%] [2:-1637.50%] [4:33.30%] % Reduction in Volume: 21.70% [2:-3360.60%] [4:33.30%] Classification: [1:Unclassifiable] [2:Full Thickness Without Exposed Support Structures] [4:Unclassifiable] HBO Classification: [1:Grade 0] [2:Grade 1] [4:Grade 0] Exudate  Amount: [1:None Present] [2:Small] [4:None Present] Wound Margin: [1:Flat and Intact] [2:Flat and Intact] [4:Flat and Intact] Granulation Amount: [1:None Present (0%)] [2:Small (1-33%)] [4:None Present (0%)] Granulation Quality: [1:N/A] [2:Pink] [4:N/A] Necrotic Amount: [1:Large (67-100%)] [2:Large (67-100%)] [4:Large (67-100%)] Necrotic Tissue: [1:Eschar] [2:Eschar] [4:Eschar] Exposed Structures: Beighley, Wessie D. ([id number]) Fascia: No Fascia: No Fascia: No Fat: No Fat: No Fat: No Tendon: No Tendon: No Tendon: No Muscle: No Muscle: No Muscle: No Joint: No Joint: No Joint: No Bone: No Bone: No Bone: No Limited to Skin Limited to Skin Limited to Skin Breakdown Breakdown Breakdown Epithelialization: Small (1-33%) None None Periwound Skin Texture: Edema: No Edema: No Edema: No Excoriation: No Excoriation: No Excoriation: No Induration: No Induration: No Induration: No Callus: No Callus: No Callus: No Crepitus: No Crepitus: No Crepitus: No Fluctuance: No Fluctuance: No Fluctuance: No Friable: No Friable: No Friable: No Rash: No Rash: No Rash: No Scarring: No Scarring: No Scarring: No Periwound Skin Dry/Scaly: Yes Moist: Yes Dry/Scaly: Yes Moisture: Maceration: No Dry/Scaly: Yes Maceration: No Moist: No Maceration: No Moist: No Periwound Skin Color: Atrophie Blanche: No Atrophie Blanche: No Hemosiderin Staining: Yes Cyanosis: No Cyanosis: No Atrophie Blanche: No Ecchymosis: No Ecchymosis: No Cyanosis: No Erythema: No Erythema: No Ecchymosis: No Hemosiderin Staining: No Hemosiderin Staining: No Erythema: No Mottled: No Mottled: No Mottled: No Pallor: No Pallor: No Pallor: No Rubor: No Rubor: No Rubor: No Temperature: No Abnormality No Abnormality No Abnormality Tenderness on Yes No No Palpation: Wound Preparation: Ulcer Cleansing: Ulcer Cleansing: Other: Ulcer Cleansing: Rinsed/Irrigated with soap water Rinsed/Irrigated with Saline  Saline Topical Anesthetic Topical Anesthetic Applied: Other: lidocaine Topical Anesthetic Applied: Other: lidocaine 4% Applied: Other: lidocaine 4% 4% Treatment Notes Electronic Signature(s) Signed: 03/07/2015 12:21:26 PM By: Regan Lemming [name], RN Entered By: Regan Lemming on 03/07/2015 10:52:56 Cloninger, Kambri DMarland [name] ([id number]) -------------------------------------------------------------------------------- [place] Details Patient Name: SIBLEY, ROLISON D. Date of Service: 03/07/2015 10:15 AM Medical Record Number: [id number] Patient Account Number: [id number] Date of Birth/Sex: [birth year] (69 y.o. Female) Treating RN: [name], RN, [name], [name] Primary Care Physician: Glendon Axe Other Clinician: Referring Physician: Glendon Axe Treating Physician/Extender: Frann Rider in Treatment: 5 Active Inactive Orientation to the Wound Care Program Nursing Diagnoses: Knowledge deficit related to the wound healing center program Goals: Patient/caregiver will verbalize understanding of the [place] Program Date Initiated: 01/31/2015 Goal Status: Active Interventions: Provide education on orientation to the wound center Notes: Venous Leg Ulcer Nursing Diagnoses: Potential for venous Insuffiency (use before diagnosis confirmed) Goals: Non-invasive venous studies are completed as ordered Date Initiated: 01/31/2015 Goal Status: Active Patient/caregiver will verbalize understanding of disease process and disease management Date Initiated: 01/31/2015 Goal Status: Active Interventions: Assess peripheral edema status every visit. Notes: Wound/Skin Impairment Nursing Diagnoses: Impaired tissue integrity  Knowledge deficit related to smoking impact on wound healing [name], [name] ([id number]) Goals: Patient/caregiver will verbalize understanding of skin care regimen Date Initiated: 01/31/2015 Goal Status: Active Ulcer/skin breakdown will heal within 14 weeks Date  Initiated: 01/31/2015 Goal Status: Active Interventions: Assess ulceration(s) every visit Notes: Electronic Signature(s) Signed: 03/07/2015 12:21:26 PM By: Regan Lemming [name], RN Entered By: Regan Lemming on 03/07/2015 10:52:33 [name], [name] ([id number]) -------------------------------------------------------------------------------- Pain Assessment Details Patient Name: Kristin Stare D. Date of Service: 03/07/2015 10:15 AM Medical Record Number: [id number] Patient Account Number: [id number] Date of Birth/Sex: 07-25-1946 (69 y.o. Female) Treating RN: Baruch Gouty, RN, [name], [name] Primary Care Physician: Glendon Axe Other Clinician: Referring Physician: Glendon Axe Treating Physician/Extender: Frann Rider in Treatment: 5 Active Problems Location of Pain Severity and Description of Pain Patient Has Paino No Site Locations Pain Management and Medication Current Pain Management: Electronic Signature(s) Signed: 03/07/2015 12:21:26 PM By: Regan Lemming [name], RN Entered By: Regan Lemming on 03/07/2015 10:25:54 [name], [name] ([id number]) -------------------------------------------------------------------------------- Patient/Caregiver Education Details Patient Name: LATICA, HOHMANN D. Date of Service: 03/07/2015 10:15 AM Medical Record Number: [id number] Patient Account Number: [id number] Date of Birth/Gender: [birth year] (69 y.o. Female) Treating RN: Baruch Gouty, RN, [name], [name] Primary Care Physician: Glendon Axe Other Clinician: Referring Physician: Glendon Axe Treating Physician/Extender: Frann Rider in Treatment: 5 Education Assessment Education Provided To: Patient Education Topics Provided Wound/Skin Impairment: Handouts: Other: elevation of leg, ankle pump exercises Methods: Explain/Verbal Responses: State content correctly Electronic Signature(s) Signed: 03/07/2015 12:21:26 PM By: Regan Lemming [name], RN Entered By: Regan Lemming on 03/07/2015 11:13:20 [name], [name]  ([id number]) -------------------------------------------------------------------------------- Wound Assessment Details Patient Name: Kristin Stare D. Date of Service: 03/07/2015 10:15 AM Medical Record Number: [id number] Patient Account Number: [id number] Date of Birth/Sex: [birth year] (69 y.o. Female) Treating RN: [name], RN, [name], [place] Primary Care Physician: Glendon Axe Other Clinician: Referring Physician: Glendon Axe Treating Physician/Extender: Frann Rider in Treatment: 5 Wound Status Wound Number: 1 Primary Venous Leg Ulcer Etiology: Wound Location: Left Lower Leg - Proximal Wound Open Wounding Event: Blister Status: Date Acquired: 12/30/2014 Comorbid Cataracts, Asthma, Coronary Artery Weeks Of Treatment: 5 History: Disease, Hypertension, Type II Clustered Wound: No Diabetes, Osteoarthritis, Neuropathy Photos Photo Uploaded By: Regan Lemming on 03/07/2015 14:16:22 Wound Measurements Length: (cm) 1 Width: (cm) 0.6 Depth: (cm) 0.1 Area: (cm) 0.471 Volume: (cm) 0.047 % Reduction in Area: 21.1% % Reduction in Volume: 21.7% Epithelialization: Small (1-33%) Tunneling: No Undermining: No Wound Description Classification: Unclassifiable Diabetic Severity Kristin [place]): Grade 0 Wound Margin: Flat and Intact Exudate Amount: None Present Foul Odor After Cleansing: No Wound Bed Granulation Amount: None Present (0%) Exposed Structure Necrotic Amount: Large (67-100%) Fascia Exposed: No Necrotic Quality: Eschar Fat Layer Exposed: No Tendon Exposed: No Muscle Exposed: No Burkes, Kailoni D. ([id number]) Joint Exposed: No Bone Exposed: No Limited to Skin Breakdown Periwound Skin Texture Texture Color No Abnormalities Noted: No No Abnormalities Noted: No Callus: No Atrophie Blanche: No Crepitus: No Cyanosis: No Excoriation: No Ecchymosis: No Fluctuance: No Erythema: No Friable: No Hemosiderin Staining: No Induration: No Mottled: No Localized Edema:  No Pallor: No Rash: No Rubor: No Scarring: No Temperature / Pain Moisture Temperature: No Abnormality No Abnormalities Noted: No Tenderness on Palpation: Yes Dry / Scaly: Yes Maceration: No Moist: No Wound Preparation Ulcer Cleansing: Rinsed/Irrigated with Saline Topical Anesthetic Applied: Other: lidocaine 4%, Treatment Notes Wound #1 (Left, Proximal Lower Leg) 1. Cleansed with: Cleanse wound with antibacterial soap and water 2. Anesthetic Topical Lidocaine 4% cream to wound bed prior to debridement  4. Dressing Applied: Acticoat 7 5. Secondary Dressing Applied ABD and Kerlix/Conform 7. Secured with Tape 2 Layer Compression System - Left Lower Extremity Electronic Signature(s) Signed: 03/07/2015 12:21:26 PM By: Regan Lemming [name], RN Entered By: Regan Lemming on 03/07/2015 10:38:31 [name], [name] ([id number]) -------------------------------------------------------------------------------- Wound Assessment Details Patient Name: FARA, WORTHY D. Date of Service: 03/07/2015 10:15 AM Medical Record Number: [id number] Patient Account Number: [id number] Date of Birth/Sex: [birth year] (69 y.o. Female) Treating RN: [name], RN, [name], [place] Primary Care Physician: Glendon Axe Other Clinician: Referring Physician: Glendon Axe Treating Physician/Extender: Frann Rider in Treatment: 5 Wound Status Wound Number: 2 Primary Venous Leg Ulcer Etiology: Wound Location: Left Lower Leg - Distal Wound Open Wounding Event: Blister Status: Date Acquired: 12/30/2014 Comorbid Cataracts, Asthma, Coronary Artery Weeks Of Treatment: 5 History: Disease, Hypertension, Type II Clustered Wound: Yes Diabetes, Osteoarthritis, Neuropathy Photos Photo Uploaded By: Regan Lemming on 03/07/2015 14:16:23 Wound Measurements Length: (cm) 6.8 Width: (cm) 2.3 Depth: (cm) 0.2 Area: (cm) 12.284 Volume: (cm) 2.457 % Reduction in Area: -1637.5% % Reduction in Volume: -3360.6% Epithelialization:  None Tunneling: No Undermining: No Wound Description Full Thickness Without Classification: Exposed Support Structures Diabetic Severity Grade 1 (Wagner): Wound Margin: Flat and Intact Exudate Amount: Small Foul Odor After Cleansing: No Wound Bed Granulation Amount: Small (1-33%) Exposed Structure Granulation Quality: Pink Fascia Exposed: No Necrotic Amount: Large (67-100%) Fat Layer Exposed: No Yeats, Jennalyn D. ([id number]) Necrotic Quality: Eschar Tendon Exposed: No Muscle Exposed: No Joint Exposed: No Bone Exposed: No Limited to Skin Breakdown Periwound Skin Texture Texture Color No Abnormalities Noted: No No Abnormalities Noted: No Callus: No Atrophie Blanche: No Crepitus: No Cyanosis: No Excoriation: No Ecchymosis: No Fluctuance: No Erythema: No Friable: No Hemosiderin Staining: No Induration: No Mottled: No Localized Edema: No Pallor: No Rash: No Rubor: No Scarring: No Temperature / Pain Moisture Temperature: No Abnormality No Abnormalities Noted: No Dry / Scaly: Yes Maceration: No Moist: Yes Wound Preparation Ulcer Cleansing: Other: soap water, Topical Anesthetic Applied: Other: lidocaine 4%, Treatment Notes Wound #2 (Left, Distal Lower Leg) 1. Cleansed with: Cleanse wound with antibacterial soap and water 2. Anesthetic Topical Lidocaine 4% cream to wound bed prior to debridement 4. Dressing Applied: Acticoat 7 5. Secondary Dressing Applied ABD and Kerlix/Conform 7. Secured with Tape 2 Layer Compression System - Left Lower Extremity Electronic Signature(s) Signed: 03/07/2015 12:21:26 PM By: Regan Lemming [name], RN Entered By: Regan Lemming on 03/07/2015 10:38:59 Scales, Haniyyah D. ([id number]) Caniglia, Zarea D. ([id number]) -------------------------------------------------------------------------------- Wound Assessment Details Patient Name: Demirjian, Chancie D. Date of Service: 03/07/2015 10:15 AM Medical Record Number: [id number] Patient Account  Number: [id number] Date of Birth/Sex: 1945/11/09 (69 y.o. Female) Treating RN: [name], RN, [name], [place] Primary Care Physician: Glendon Axe Other Clinician: Referring Physician: Glendon Axe Treating Physician/Extender: Frann Rider in Treatment: 5 Wound Status Wound Number: 4 Primary Venous Leg Ulcer Etiology: Wound Location: Left Lower Leg - Posterior Wound Open Wounding Event: Blister Status: Date Acquired: 12/30/2014 Comorbid Cataracts, Asthma, Coronary Artery Weeks Of Treatment: 5 History: Disease, Hypertension, Type II Clustered Wound: No Diabetes, Osteoarthritis, Neuropathy Photos Photo Uploaded By: Regan Lemming on 03/07/2015 14:16:24 Wound Measurements Length: (cm) 0.7 Width: (cm) 0.4 Depth: (cm) 0.1 Area: (cm) 0.22 Volume: (cm) 0.022 % Reduction in Area: 33.3% % Reduction in Volume: 33.3% Epithelialization: None Tunneling: No Undermining: No Wound Description Classification: Unclassifiable Foul Odor A Diabetic Severity (Wagner): Grade 0 Wound Margin: Flat and Intact Exudate Amount: None Present fter Cleansing: No Wound Bed Granulation Amount: None Present (0%) Exposed  Structure Necrotic Amount: Large (67-100%) Fascia Exposed: No Necrotic Quality: Eschar Fat Layer Exposed: No Tendon Exposed: No Muscle Exposed: No Horsman, Chrislynn D. ([id number]) Joint Exposed: No Bone Exposed: No Limited to Skin Breakdown Periwound Skin Texture Texture Color No Abnormalities Noted: No No Abnormalities Noted: No Callus: No Atrophie Blanche: No Crepitus: No Cyanosis: No Excoriation: No Ecchymosis: No Fluctuance: No Erythema: No Friable: No Hemosiderin Staining: Yes Induration: No Mottled: No Localized Edema: No Pallor: No Rash: No Rubor: No Scarring: No Temperature / Pain Moisture Temperature: No Abnormality No Abnormalities Noted: No Dry / Scaly: Yes Maceration: No Moist: No Wound Preparation Ulcer Cleansing: Rinsed/Irrigated with  Saline Topical Anesthetic Applied: Other: lidocaine 4%, Treatment Notes Wound #4 (Left, Posterior Lower Leg) 1. Cleansed with: Cleanse wound with antibacterial soap and water 2. Anesthetic Topical Lidocaine 4% cream to wound bed prior to debridement 4. Dressing Applied: Acticoat 7 5. Secondary Dressing Applied ABD and Kerlix/Conform 7. Secured with Tape 2 Layer Compression System - Left Lower Extremity Electronic Signature(s) Signed: 03/07/2015 12:21:26 PM By: Regan Lemming [name], RN Entered By: Regan Lemming on 03/07/2015 10:39:17 Feenstra, Lanay D. ([id number]) -------------------------------------------------------------------------------- Vitals Details Patient Name: Kristin Stare D. Date of Service: 03/07/2015 10:15 AM Medical Record Number: [id number] Patient Account Number: [id number] Date of Birth/Sex: 01-Feb-1946 (69 y.o. Female) Treating RN: [name], RN, [name], [place] Primary Care Physician: Glendon Axe Other Clinician: Referring Physician: Glendon Axe Treating Physician/Extender: Frann Rider in Treatment: 5 Vital Signs Time Taken: 10:25 Temperature (F): 97.8 Height (in): 65 Pulse (bpm): 71 Weight (lbs): 248 Respiratory Rate (breaths/min): 16 Body Mass Index (BMI): 41.3 Blood Pressure (mmHg): 142/55 Reference Range: 80 - 120 mg / dl Electronic Signature(s) Signed: 03/07/2015 12:21:26 PM By: Regan Lemming [name], RN Entered By: Regan Lemming on 03/07/2015 10:28:40

## 2015-03-11 ENCOUNTER — Ambulatory Visit
Admission: RE | Admit: 2015-03-11 | Discharge: 2015-03-11 | Disposition: A | Discharge status: Home / Self Care | Payer: PPO | Source: Ambulatory Visit | Attending: Ophthalmology | Admitting: Ophthalmology

## 2015-03-11 ENCOUNTER — Ambulatory Visit: Payer: PPO | Admitting: Certified Registered"

## 2015-03-11 ENCOUNTER — Encounter
Admission: RE | Disposition: A | Discharge status: Home / Self Care | Payer: Self-pay | Source: Ambulatory Visit | Attending: Ophthalmology

## 2015-03-11 DIAGNOSIS — M1990 Unspecified osteoarthritis, unspecified site: Secondary | ICD-10-CM | POA: Diagnosis not present

## 2015-03-11 DIAGNOSIS — Z90710 Acquired absence of both cervix and uterus: Secondary | ICD-10-CM | POA: Insufficient documentation

## 2015-03-11 DIAGNOSIS — K449 Diaphragmatic hernia without obstruction or gangrene: Secondary | ICD-10-CM | POA: Diagnosis not present

## 2015-03-11 DIAGNOSIS — E780 Pure hypercholesterolemia: Secondary | ICD-10-CM | POA: Insufficient documentation

## 2015-03-11 DIAGNOSIS — N183 Chronic kidney disease, stage 3 (moderate): Secondary | ICD-10-CM | POA: Insufficient documentation

## 2015-03-11 DIAGNOSIS — E119 Type 2 diabetes mellitus without complications: Secondary | ICD-10-CM | POA: Insufficient documentation

## 2015-03-11 DIAGNOSIS — G629 Polyneuropathy, unspecified: Secondary | ICD-10-CM | POA: Diagnosis not present

## 2015-03-11 DIAGNOSIS — M1610 Unilateral primary osteoarthritis, unspecified hip: Secondary | ICD-10-CM | POA: Diagnosis not present

## 2015-03-11 DIAGNOSIS — R062 Wheezing: Secondary | ICD-10-CM | POA: Insufficient documentation

## 2015-03-11 DIAGNOSIS — Z881 Allergy status to other antibiotic agents status: Secondary | ICD-10-CM | POA: Insufficient documentation

## 2015-03-11 DIAGNOSIS — K219 Gastro-esophageal reflux disease without esophagitis: Secondary | ICD-10-CM | POA: Diagnosis not present

## 2015-03-11 DIAGNOSIS — I2510 Atherosclerotic heart disease of native coronary artery without angina pectoris: Secondary | ICD-10-CM | POA: Diagnosis not present

## 2015-03-11 DIAGNOSIS — I10 Essential (primary) hypertension: Secondary | ICD-10-CM | POA: Insufficient documentation

## 2015-03-11 DIAGNOSIS — R002 Palpitations: Secondary | ICD-10-CM | POA: Insufficient documentation

## 2015-03-11 DIAGNOSIS — Z880 Allergy status to penicillin: Secondary | ICD-10-CM | POA: Diagnosis not present

## 2015-03-11 DIAGNOSIS — L97829 Non-pressure chronic ulcer of other part of left lower leg with unspecified severity: Secondary | ICD-10-CM | POA: Diagnosis not present

## 2015-03-11 DIAGNOSIS — I129 Hypertensive chronic kidney disease with stage 1 through stage 4 chronic kidney disease, or unspecified chronic kidney disease: Secondary | ICD-10-CM | POA: Insufficient documentation

## 2015-03-11 DIAGNOSIS — E049 Nontoxic goiter, unspecified: Secondary | ICD-10-CM | POA: Insufficient documentation

## 2015-03-11 DIAGNOSIS — H2511 Age-related nuclear cataract, right eye: Secondary | ICD-10-CM | POA: Insufficient documentation

## 2015-03-11 DIAGNOSIS — Z882 Allergy status to sulfonamides status: Secondary | ICD-10-CM | POA: Diagnosis not present

## 2015-03-11 HISTORY — PX: CATARACT EXTRACTION W/PHACO: SHX586

## 2015-03-11 LAB — GLUCOSE, CAPILLARY: Glucose-Capillary: 124 mg/dL — ABNORMAL HIGH (ref 65–99)

## 2015-03-11 SURGERY — PHACOEMULSIFICATION, CATARACT, WITH IOL INSERTION
Anesthesia: Monitor Anesthesia Care | Laterality: Right

## 2015-03-11 MED ORDER — SODIUM CHLORIDE 0.9 % IV SOLN
INTRAVENOUS | Status: DC | PRN
  Administered 2015-03-11: 07:00:00 via INTRAVENOUS

## 2015-03-11 MED ORDER — LIDOCAINE HCL 3.5 % OP GEL
Freq: Once | OPHTHALMIC | Status: AC
  Administered 2015-03-11: 07:00:00 via OPHTHALMIC

## 2015-03-11 MED ORDER — CYCLOPENTOLATE HCL 2 % OP SOLN
1.0000 [drp] | OPHTHALMIC | Status: AC
  Administered 2015-03-11 (×4): 1 [drp] via OPHTHALMIC

## 2015-03-11 MED ORDER — EPINEPHRINE HCL 1 MG/ML IJ SOLN
INTRAOCULAR | Status: DC | PRN
  Administered 2015-03-11: 200 mL

## 2015-03-11 MED ORDER — POLYMYXIN B-TRIMETHOPRIM 10000-0.1 UNIT/ML-% OP SOLN
OPHTHALMIC | Status: AC
  Administered 2015-03-11: 1 [drp] via OPHTHALMIC
  Filled 2015-03-11: qty 10

## 2015-03-11 MED ORDER — EPINEPHRINE HCL 1 MG/ML IJ SOLN
INTRAMUSCULAR | Status: AC
  Filled 2015-03-11: qty 1

## 2015-03-11 MED ORDER — CARBACHOL 0.01 % IO SOLN
INTRAOCULAR | Status: DC | PRN
  Administered 2015-03-11: 0.5 mL via INTRAOCULAR

## 2015-03-11 MED ORDER — POVIDONE-IODINE 5 % OP SOLN
1.0000 "application " | Freq: Once | OPHTHALMIC | Status: AC
  Administered 2015-03-11: 1 via OPHTHALMIC

## 2015-03-11 MED ORDER — LIDOCAINE HCL 3.5 % OP GEL
OPHTHALMIC | Status: AC
Start: 2015-03-11 — End: 2015-03-11
  Filled 2015-03-11: qty 1

## 2015-03-11 MED ORDER — POLYMYXIN B-TRIMETHOPRIM 10000-0.1 UNIT/ML-% OP SOLN
1.0000 [drp] | OPHTHALMIC | Status: AC
  Administered 2015-03-11 (×3): 1 [drp] via OPHTHALMIC

## 2015-03-11 MED ORDER — POLYMYXIN B-TRIMETHOPRIM 10000-0.1 UNIT/ML-% OP SOLN
OPHTHALMIC | Status: DC | PRN
  Administered 2015-03-11: 1 [drp] via OPHTHALMIC

## 2015-03-11 MED ORDER — TETRACAINE HCL 0.5 % OP SOLN
OPHTHALMIC | Status: AC
  Administered 2015-03-11: 1 [drp] via OPHTHALMIC
  Filled 2015-03-11: qty 2

## 2015-03-11 MED ORDER — CYCLOPENTOLATE HCL 2 % OP SOLN
OPHTHALMIC | Status: AC
  Administered 2015-03-11: 1 [drp] via OPHTHALMIC
  Filled 2015-03-11: qty 2

## 2015-03-11 MED ORDER — PHENYLEPHRINE HCL 10 % OP SOLN
1.0000 [drp] | OPHTHALMIC | Status: AC
  Administered 2015-03-11 (×4): 1 [drp] via OPHTHALMIC

## 2015-03-11 MED ORDER — CEFUROXIME OPHTHALMIC INJECTION 1 MG/0.1 ML
INJECTION | OPHTHALMIC | Status: AC
  Filled 2015-03-11: qty 0.1

## 2015-03-11 MED ORDER — NA CHONDROIT SULF-NA HYALURON 40-17 MG/ML IO SOLN
INTRAOCULAR | Status: AC
  Filled 2015-03-11: qty 1

## 2015-03-11 MED ORDER — MIDAZOLAM HCL 2 MG/2ML IJ SOLN
INTRAMUSCULAR | Status: DC | PRN
  Administered 2015-03-11: 1 mg via INTRAVENOUS

## 2015-03-11 MED ORDER — TETRACAINE HCL 0.5 % OP SOLN
1.0000 [drp] | Freq: Once | OPHTHALMIC | Status: AC
  Administered 2015-03-11: 1 [drp] via OPHTHALMIC

## 2015-03-11 MED ORDER — PHENYLEPHRINE HCL 10 % OP SOLN
OPHTHALMIC | Status: AC
  Administered 2015-03-11: 1 [drp] via OPHTHALMIC
  Filled 2015-03-11: qty 5

## 2015-03-11 MED ORDER — POVIDONE-IODINE 5 % OP SOLN
OPHTHALMIC | Status: AC
  Administered 2015-03-11: 1 via OPHTHALMIC
  Filled 2015-03-11: qty 30

## 2015-03-11 SURGICAL SUPPLY — 23 items
ACTIVE FMS ×3 IMPLANT
CANNULA ANT/CHMB 27GA (MISCELLANEOUS) ×3 IMPLANT
GLOVE BIO SURGEON STRL SZ8 (GLOVE) ×3 IMPLANT
GLOVE BIOGEL M 6.5 STRL (GLOVE) ×3 IMPLANT
GLOVE SURG LX 8.0 MICRO (GLOVE) ×2
GLOVE SURG LX STRL 8.0 MICRO (GLOVE) ×1 IMPLANT
GOWN STRL REUS W/ TWL LRG LVL3 (GOWN DISPOSABLE) ×2 IMPLANT
GOWN STRL REUS W/TWL LRG LVL3 (GOWN DISPOSABLE) ×4
LENS IOL TECNIS 20.5 (Intraocular Lens) ×3 IMPLANT
LENS IOL TECNIS MONO 1P 20.5 (Intraocular Lens) ×1 IMPLANT
PACK CATARACT (MISCELLANEOUS) ×3 IMPLANT
PACK CATARACT BRASINGTON LX (MISCELLANEOUS) ×3 IMPLANT
PACK EYE AFTER SURG (MISCELLANEOUS) ×3 IMPLANT
SOL BSS BAG (MISCELLANEOUS) ×3
SOL PREP PVP 2OZ (MISCELLANEOUS) ×3
SOLUTION BSS BAG (MISCELLANEOUS) ×1 IMPLANT
SOLUTION PREP PVP 2OZ (MISCELLANEOUS) ×1 IMPLANT
SYR 5ML LL (SYRINGE) ×3 IMPLANT
SYR TB 1ML 27GX1/2 LL (SYRINGE) ×3 IMPLANT
WATER STERILE IRR 1000ML POUR (IV SOLUTION) ×3 IMPLANT
WIPE NON LINTING 3.25X3.25 (MISCELLANEOUS) ×3 IMPLANT
ZCBOO20.5 TECNIS ASPHERIC IOL ×3 IMPLANT
zcboo20.5 ×3 IMPLANT

## 2015-03-11 NOTE — H&P (Signed)
  All labs reviewed. Abnormal studies sent to patients PCP when indicated.  Previous H&P reviewed, patient examined, there are NO CHANGES.  

## 2015-03-11 NOTE — Discharge Instructions (Addendum)
See cataract post op handout Eye Surgery Discharge Instructions  Expect mild scratchy sensation or mild soreness. DO NOT RUB YOUR EYE!  The day of surgery:  Minimal physical activity, but bed rest is not required  No reading, computer work, or close hand work  No bending, lifting, or straining.  May watch TV  For 24 hours:  No driving, legal decisions, or alcoholic beverages  Safety precautions  Eat anything you prefer: It is better to start with liquids, then soup then solid foods.  _____ Eye patch should be worn until postoperative exam tomorrow.  ____ Solar shield eyeglasses should be worn for comfort in the sunlight/patch while sleeping  Resume all regular medications including aspirin or Coumadin if these were discontinued prior to surgery. You may shower, bathe, shave, or wash your hair. Tylenol may be taken for mild discomfort.  Call your doctor if you experience significant pain, nausea, or vomiting, fever > 101 or other signs of infection. [phone] or [phone] Specific instructions:

## 2015-03-11 NOTE — Transfer of Care (Signed)
Immediate Anesthesia Transfer of Care Note  Patient: [name]  Procedure(s) Performed: Procedure(s) with comments: CATARACT EXTRACTION PHACO AND INTRAOCULAR LENS PLACEMENT (IOC) (Right) - [place] 00:40 AP% 24.3 CDE 9.86  Patient Location: PACU  Anesthesia Type:MAC  Level of Consciousness: awake, alert , oriented and patient cooperative  Airway & Oxygen Therapy: Patient Spontanous Breathing  Post-op Assessment: Report given to RN, Post -op Vital signs reviewed and stable and Patient moving all extremities X 4  Post vital signs: Reviewed and stable  Last Vitals:  Filed Vitals:   03/11/15 0755  BP: 151/85  Pulse: 78  Temp: 36 C  Resp: 14    Complications: No apparent anesthesia complications

## 2015-03-11 NOTE — Op Note (Signed)
PREOPERATIVE DIAGNOSIS:  Nuclear sclerotic cataract of the right eye.   POSTOPERATIVE DIAGNOSIS: same   OPERATIVE PROCEDURE:  Procedure(s): CATARACT EXTRACTION PHACO AND INTRAOCULAR LENS PLACEMENT ([facility])   SURGEON:  Birder Robson, MD.   ANESTHESIA: 1.      Managed anesthesia care. 2.      Topical tetracaine drops followed by 2% Xylocaine jelly applied in the preoperative holding area.   COMPLICATIONS:  None.   TECHNIQUE:   Stop and chop   DESCRIPTION OF PROCEDURE:  The patient was examined and consented in the preoperative holding area where the aforementioned topical anesthesia was applied to the right eye and then brought back to the Operating Room where the right eye was prepped and draped in the usual sterile ophthalmic fashion and a lid speculum was placed. A paracentesis was created with the side port blade and the anterior chamber was filled with viscoelastic. A near clear corneal incision was performed with the steel keratome. A continuous curvilinear capsulorrhexis was performed with a cystotome followed by the capsulorrhexis forceps. Hydrodissection and hydrodelineation were carried out with BSS on a blunt cannula. The lens was removed in a stop and chop  technique and the remaining cortical material was removed with the irrigation-aspiration handpiece. The capsular bag was inflated with viscoelastic and the Technis ZCB00  lens was placed in the capsular bag without complication. The remaining viscoelastic was removed from the eye with the irrigation-aspiration handpiece. The wounds were hydrated. The anterior chamber was flushed with Miostat and the eye was inflated to physiologic pressure.. The wounds were found to be water tight. The eye was dressed with Polytrim. The patient was given protective glasses to wear throughout the day and a shield with which to sleep tonight. The patient was also given drops with which to begin a drop regimen today and will follow-up with me in one  day.  Implant Name Type Inv. Item Serial No. Manufacturer Lot No. LRB No. Used  [id number]     [id number]     Right 1    Electronically signed: [place] 03/11/2015 7:53 AM

## 2015-03-11 NOTE — Anesthesia Preprocedure Evaluation (Signed)
Anesthesia Evaluation  Patient identified by MRN, date of birth, ID band Patient awake    Reviewed: Allergy & Precautions, H&P , NPO status , Patient's Chart, lab work & pertinent test results, reviewed documented beta blocker date and time   Airway Mallampati: II  TM Distance: >3 FB Neck ROM: full    Dental no notable dental hx.    Pulmonary neg pulmonary ROS,  breath sounds clear to auscultation  Pulmonary exam normal       Cardiovascular Exercise Tolerance: Good hypertension, negative cardio ROS  Rhythm:regular Rate:Normal     Neuro/Psych negative neurological ROS  negative psych ROS   GI/Hepatic negative GI ROS, Neg liver ROS,   Endo/Other  negative endocrine ROSdiabetes  Renal/GU Renal diseasenegative Renal ROS  negative genitourinary   Musculoskeletal   Abdominal   Peds  Hematology negative hematology ROS (+)   Anesthesia Other Findings   Reproductive/Obstetrics negative OB ROS                             Anesthesia Physical Anesthesia Plan  ASA: III  Anesthesia Plan: MAC   Post-op Pain Management:    Induction:   Airway Management Planned:   Additional Equipment:   Intra-op Plan:   Post-operative Plan:   Informed Consent: I have reviewed the patients History and Physical, chart, labs and discussed the procedure including the risks, benefits and alternatives for the proposed anesthesia with the patient or authorized representative who has indicated his/her understanding and acceptance.   Dental Advisory Given  Plan Discussed with: CRNA  Anesthesia Plan Comments:         Anesthesia Quick Evaluation

## 2015-03-11 NOTE — Anesthesia Postprocedure Evaluation (Signed)
  Anesthesia Post-op Note  Patient: [name]  Procedure(s) Performed: Procedure(s) with comments: CATARACT EXTRACTION PHACO AND INTRAOCULAR LENS PLACEMENT ([place]) (Right) - [place] 00:40 AP% 24.3 CDE 9.86  Anesthesia type:MAC  Patient location: PACU  Post pain: Pain level controlled  Post assessment: Post-op Vital signs reviewed, Patient's Cardiovascular Status Stable, Respiratory Function Stable, Patent Airway and No signs of Nausea or vomiting  Post vital signs: Reviewed and stable  Last Vitals:  Filed Vitals:   03/11/15 0755  BP: 151/85  Pulse: 78  Temp: 36 C  Resp: 14    Level of consciousness: awake, alert  and patient cooperative  Complications: No apparent anesthesia complications

## 2015-03-12 ENCOUNTER — Encounter: Payer: Self-pay | Admitting: Ophthalmology

## 2015-03-14 ENCOUNTER — Encounter (HOSPITAL_COMMUNITY): Payer: PPO

## 2015-03-14 ENCOUNTER — Encounter: Payer: PPO | Admitting: Surgery

## 2015-03-14 ENCOUNTER — Encounter: Payer: PPO | Admitting: Vascular Surgery

## 2015-03-14 DIAGNOSIS — L97322 Non-pressure chronic ulcer of left ankle with fat layer exposed: Secondary | ICD-10-CM | POA: Diagnosis not present

## 2015-03-14 NOTE — Progress Notes (Signed)
Kristin, [name] ([id number]) Visit Report for 03/14/2015 Chief Complaint Document Details Patient Name: [name], [name] 03/14/2015 11:30 Date of Service: AM Medical Record [id number] Number: Patient Account Number: [id number] 03/31/1946 (69 y.o. Treating RN: Date of Birth/Sex: Female) Other Clinician: Primary Care Physician: [facility], [name] Treating [name] Referring Physician: Glendon Axe Physician/Extender: Weeks in Treatment: 6 Information Obtained from: Patient Chief Complaint Patient presents to the wound care center for a consult due non healing wound 69 year old patient comes with a history of having a ulcer on the left lower extremity for the past 4 weeks. she says she's had swelling of both lower extremities for about a year after she started having prednisone. 02/07/2015 -- her vascular appointments obtained were in the first and third week of June. she is able to go to [place] and we will try and get her some earlier appointments. Other than that nothing else has changed in her management. Electronic Signature(s) Signed: 03/14/2015 12:33:17 PM By: Kristin Fudge MD, [name] Entered By: [name] on 03/14/2015 12:21:10 Kristin, [name] ([id number]) -------------------------------------------------------------------------------- HPI Details Patient Name: [name], [name] 03/14/2015 11:30 Date of Service: AM Medical Record [id number] Number: Patient Account Number: [id number] 22-Jul-1946 (69 y.o. Treating RN: Date of Birth/Sex: Female) Other Clinician: Primary Care Physician: [facility], [name] Treating [name] Referring Physician: Glendon Axe Physician/Extender: Weeks in Treatment: 6 History of Present Illness HPI Description: 69 year old patient who is known to have diabetes mellitus type 2, chronic renal insufficiency, coronary artery disease, hypertension, hypercholesterolemia, temporal arteritis and inflammatory arthritiss also has a history of having  a hysterectomy and some orthopedic related surgeries. The ulcer on the left lower extremity started off as a blister and then. Got progressively worse. She does not have any fever or chills and has not had any recent surgical intervention for this. Her last hemoglobin A1c was 10.1 in September 2015. She has been recently put on doxycycline by her PCP. She is now also allergic to doxycycline and was this was changed over to Keflex. 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 She does see a cardiologist and also takes a diuretic. 02/07/2015 her arterial and venous duplex studies to be done have dates been given as the first and third week of June. This is at [facility]. We are going to try and get early appointments at [facility]. other than that nothing has changed in her management. 02/14/2015 -- we have been able to get her an appointment in [facility] on May 20 which is much earlier than her previous ones at [place]. She continues with her prednisone and her sugars are in the range of 150-200. 02/21/2015 We were able to get a vascular lab workup for her today and she is going to be there at 2:00 this afternoon. the swelling of her leg has gone down significantly but she still has some tenderness over the wounds. 02/28/2015 - She has had one of two vascular workups done, and this coming Tuesday has another, at [facility]. She continues to be on steroid medications. She has significant sensitivity in her left lower extremity and has pain suggestive of neuropathic pain and I have asked her to address this with her primary care physician. 03/07/2015 -- The patient saw Dr. 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 Official report  is awaited. Addendum: Official reports are now available and on 03/04/2015. She was seen and lower extremity venous duplex exam was done. There was reflux present within the left greater saphenous vein below the knee and also the left small saphenous vein. Arterial duplex showed normal triphasic waveforms throughout the left lower extremity without any significant stenosis. Her ABIs were noncompressible bilaterally but a waveforms were normal and a digital pressures were normal bilaterally consistent with no significant arterial insufficiency. He has recommended endovenous ablation of both the left small saphenous and the left great saphenous vein. This would still be scheduled later. 03/14/2015 -- she has heard back from the vascular office and has surgery scheduled for sometime in July. Kristin, [name] ([id number]) Her rheumatologist has decreased her prednisone dosage but she still on it. She has also had cataract surgery in her right eye recently this week. Electronic Signature(s) Signed: 03/14/2015 12:33:17 PM By: Kristin Fudge MD, [name] Entered By: [name] on 03/14/2015 12:23:34 [name], [name] ([id number]) -------------------------------------------------------------------------------- Physical Exam Details Patient Name: Kristin, [name] [name] 03/14/2015 11:30 Date of Service: AM Medical Record [id number] Number: Patient Account Number: [id number] May 14, 1946 (69 y.o. Treating RN: Date of Birth/Sex: Female) Other Clinician: Primary Care Physician: [facility], [name] Treating [name] Referring Physician: Glendon Axe Physician/Extender: Weeks in Treatment: 6 Constitutional . Pulse regular. Respirations normal and unlabored. Afebrile. . Eyes Nonicteric. Reactive to light. Ears, Nose, Mouth, and Throat Lips, teeth, and gums WNL.Marland Kitchen Moist mucosa without lesions . Neck supple and nontender. No palpable supraclavicular or cervical adenopathy. Normal sized without  goiter. Respiratory WNL. No retractions.. Cardiovascular Pedal Pulses WNL. No clubbing, cyanosis or edema. Integumentary (Hair, Skin) The area which we had debrided on her left lower extremity is now back to an adherent eschar and this makes it pretty difficult to debride. However it is not infected or inflamed.. No crepitus or fluctuance. No peri- wound warmth or erythema. No masses.Marland Kitchen Psychiatric Judgement and insight Intact.. No evidence of depression, anxiety, or agitation.. Electronic Signature(s) Signed: 03/14/2015 12:33:17 PM By: Kristin Fudge MD, [name] Entered By: [name] on 03/14/2015 12:26:21 [name], [name] ([id number]) -------------------------------------------------------------------------------- Physician Orders Details Patient Name: [name], [name] 03/14/2015 11:30 Date of Service: AM Medical Record [id number] Number: Patient Account Number: [id number] 21-Apr-1946 (69 y.o. Treating RN: [name] Date of Birth/Sex: Female) Other Clinician: Primary Care Physician: [facility], Delana Meyer Treating Janei Scheff Referring Physician: Glendon Axe Physician/Extender: Weeks in Treatment: 6 Verbal / Phone Orders: Yes Clinician: [name] Read Back and Verified: Yes Diagnosis Coding Wound Cleansing Wound #1 Left,Proximal Lower Leg o Cleanse wound with mild soap and water Wound #2 Left,Distal Lower Leg o Cleanse wound with mild soap and water Wound #4 Left,Posterior Lower Leg o Cleanse wound with mild soap and water Anesthetic Wound #1 Left,Proximal Lower Leg o Topical Lidocaine 4% cream applied to wound bed prior to debridement Wound #2 Left,Distal Lower Leg o Topical Lidocaine 4% cream applied to wound bed prior to debridement Wound #4 Left,Posterior Lower Leg o Topical Lidocaine 4% cream applied to wound bed prior to debridement Primary Wound Dressing Wound #1 Left,Proximal Lower Leg o Acticoat 7 Wound #2 Left,Distal Lower Leg o Acticoat  7 Wound #4 Left,Posterior Lower Leg o Acticoat 7 Secondary Dressing Wound #1 Left,Proximal Lower Leg o ABD pad Wound #2 Left,Distal Lower Leg Caso, Janne [name] ([id number]) o ABD pad Wound #4 Left,Posterior Lower Leg o ABD pad Dressing Change Frequency Wound #1 Left,Proximal  Lower Leg o Change dressing every week Wound #2 Left,Distal Lower Leg o Change dressing every week Wound #4 Left,Posterior Lower Leg o Change dressing every week Follow-up Appointments Wound #1 Left,Proximal Lower Leg o Return Appointment in 1 week. Wound #2 Left,Distal Lower Leg o Return Appointment in 1 week. Wound #4 Left,Posterior Lower Leg o Return Appointment in 1 week. Edema Control Wound #1 Left,Proximal Lower Leg o 2 Layer Compression System - Left Lower Extremity Wound #2 Left,Distal Lower Leg o 2 Layer Compression System - Left Lower Extremity Wound #4 Left,Posterior Lower Leg o 2 Layer Compression System - Left Lower Extremity Electronic Signature(s) Signed: 03/14/2015 12:33:17 PM By: Kristin Fudge MD, [name] Signed: 03/14/2015 3:22:42 PM By: [name] Entered By: [name] on 03/14/2015 12:10:10 [name], [name] ([id number]) -------------------------------------------------------------------------------- Problem List Details Patient Name: [name], [name] 03/14/2015 11:30 Date of Service: AM Medical Record [id number] Number: Patient Account Number: [id number] 1945-12-26 (69 y.o. 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 03/14/2015 11:30 Date of Service: AM Medical Record [id number] Number: Patient Account Number: [id number] 1946/06/15 (69 y.o. Treating RN: Date of Birth/Sex: Female) Other Clinician: Primary Care Physician: [facility], [name] Treating [name] Referring Physician: Glendon Axe Physician/Extender: Weeks in Treatment: 6 Subjective Chief Complaint Information obtained from Patient Patient presents to the wound care center for a consult due non healing wound 69 year old patient comes with a history of having a ulcer on the left lower extremity for the past 4 weeks. she says she's had swelling of both lower extremities for about a year after she started having prednisone. 02/07/2015 -- her vascular appointments obtained were in the first and third week of June. she is able to go to [place] and we will try and get her some earlier appointments. Other than that nothing else has changed in her management. History of Present Illness (HPI) 69 year old patient who is known to have diabetes mellitus type 2, chronic renal insufficiency, coronary artery disease, hypertension, hypercholesterolemia, temporal arteritis and inflammatory arthritiss also has a history of having a hysterectomy and some orthopedic related surgeries. The ulcer on the left lower extremity started off as a blister and then.  Got progressively worse. She does not have any fever or chills and has not had any recent surgical intervention for this. Her last hemoglobin A1c was 10.1 in September 2015. She has been recently put on doxycycline by her PCP. She is now also allergic to doxycycline and was this was changed over to Keflex. 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 She does see a cardiologist and also takes a diuretic. 02/07/2015 her arterial  and venous duplex studies to be done have dates been given as the first and third week of June. This is at [facility]. We are going to try and get early appointments at [facility]. other than that nothing has changed in her management. 02/14/2015 -- we have been able to get her an appointment in [facility] on May 20 which is much earlier than her previous ones at [place]. She continues with her prednisone and her sugars are in the range of 150-200. 02/21/2015 We were able to get a vascular lab workup for her today and she is going to be there at 2:00 this afternoon. the swelling of her leg has gone down significantly but she still has some tenderness over the wounds. 02/28/2015 - She has had one of two vascular workups done, and this coming Tuesday has another, at [place] [facility]. She continues to be on steroid medications. She has significant sensitivity in her left lower extremity and has pain suggestive of neuropathic pain and I have asked her to address this with her primary care physician. 03/07/2015 -- The patient saw Dr. Lucky Cowboy for a consultation and he has had her arteries are okay but she has 2 incompetent veins on the left lower extremity and he is going to set her up for surgery. Official report is [name], [name] ([id number]) awaited. Addendum: Official reports are now available and on 03/04/2015. She was seen and lower  extremity venous duplex exam was done. There was reflux present within the left greater saphenous vein below the knee and also the left small saphenous vein. Arterial duplex showed normal triphasic waveforms throughout the left lower extremity without any significant stenosis. Her ABIs were noncompressible bilaterally but a waveforms were normal and a digital pressures were normal bilaterally consistent with no significant arterial insufficiency. He has recommended endovenous ablation of both the left small saphenous and the left great saphenous vein. This would still be scheduled later. 03/14/2015 -- she has heard back from the vascular office and has surgery scheduled for sometime in July. Her rheumatologist has decreased her prednisone dosage but she still on it. She has also had cataract surgery in her right eye recently this week. Objective Constitutional Pulse regular. Respirations normal and unlabored. Afebrile. Vitals Time Taken: 11:55 AM, Height: 65 in, Weight: 248 lbs, BMI: 41.3, Temperature: 97.5 F, Pulse: 78 bpm, Respiratory Rate: 18 breaths/min, Blood Pressure: 137/59 mmHg. Eyes Nonicteric. Reactive to light. Ears, Nose, Mouth, and Throat Lips, teeth, and gums WNL.Marland Kitchen Moist mucosa without lesions . Neck supple and nontender. No palpable supraclavicular or cervical adenopathy. Normal sized without goiter. Respiratory WNL. No retractions.. Cardiovascular Pedal Pulses WNL. No clubbing, cyanosis or edema. Psychiatric Judgement and insight Intact.. No evidence of depression, anxiety, or agitation.. Integumentary (Hair, Skin) The area which we had debrided on her left lower extremity is now back to an adherent eschar and this makes it pretty difficult to debride. However it is not infected or inflamed.. No crepitus or fluctuance. No peri- [name], [name] ([id number]) wound warmth or erythema. No masses.. Wound #1 status is Open. Original cause of wound was Blister. The wound is  located on the Left,Proximal Lower Leg. The wound measures 0.8cm length x 0.4cm width x 0.1cm depth; 0.251cm^2 area and 0.025cm^3 volume. The wound is limited to skin breakdown. There is no tunneling or undermining noted. There is a none present amount of drainage noted. The wound margin is flat and intact. There is no granulation within the wound bed. There  is a large (67-100%) amount of necrotic tissue within the wound bed including Eschar. The periwound skin appearance exhibited: Dry/Scaly. The periwound skin appearance did not exhibit: Callus, Crepitus, Excoriation, Fluctuance, Friable, Induration, Localized Edema, Rash, Scarring, Maceration, Moist, Atrophie Blanche, Cyanosis, Ecchymosis, Hemosiderin Staining, Mottled, Pallor, Rubor, Erythema. Periwound temperature was noted as No Abnormality. The periwound has tenderness on palpation. Wound #2 status is Open. Original cause of wound was Blister. The wound is located on the Left,Distal Lower Leg. The wound measures 5.5cm length x 2cm width x 0.2cm depth; 8.639cm^2 area and 1.728cm^3 volume. The wound is limited to skin breakdown. There is no tunneling or undermining noted. There is a none present amount of drainage noted. The wound margin is flat and intact. There is no granulation within the wound bed. There is a large (67-100%) amount of necrotic tissue within the wound bed including Eschar. The periwound skin appearance exhibited: Dry/Scaly, Moist. The periwound skin appearance did not exhibit: Callus, Crepitus, Excoriation, Fluctuance, Friable, Induration, Localized Edema, Rash, Scarring, Maceration, Atrophie Blanche, Cyanosis, Ecchymosis, Hemosiderin Staining, Mottled, Pallor, Rubor, Erythema. Periwound temperature was noted as No Abnormality. The periwound has tenderness on palpation. Wound #4 status is Open. Original cause of wound was Blister. The wound is located on the Left,Posterior Lower Leg. The wound measures 0.5cm length x  0.3cm width x 0.1cm depth; 0.118cm^2 area and 0.012cm^3 volume. The wound is limited to skin breakdown. There is no tunneling or undermining noted. There is a none present amount of drainage noted. The wound margin is flat and intact. There is no granulation within the wound bed. There is a large (67-100%) amount of necrotic tissue within the wound bed including Eschar. The periwound skin appearance exhibited: Dry/Scaly, Hemosiderin Staining. The periwound skin appearance did not exhibit: Callus, Crepitus, Excoriation, Fluctuance, Friable, Induration, Localized Edema, Rash, Scarring, Maceration, Moist, Atrophie Blanche, Cyanosis, Ecchymosis, Mottled, Pallor, Rubor, Erythema. Periwound temperature was noted as No Abnormality. The periwound has tenderness on palpation. The area which we had debrided on her left lower extremity is now back to an adherent eschar and this makes it pretty difficult to debride. However it is not infected or inflamed. 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 She does have venous incompetence and this may be a mixed picture. I have asked her to talk to her rheumatologist the next time she is there and see if he needs to do any workup regarding a vasculitis. She does have other comorbidities like temporal arteritis and this may  be a variant of the same picture. In the meanwhile will continue with Acticoat 7 and a 2 layer compression wrap which is helping her immensely. she will come back and see [place] next week. Plan Wound Cleansing: Wound #1 Left,Proximal Lower Leg: Cleanse wound with mild soap and water Wound #2 Left,Distal Lower Leg: Cleanse wound with mild soap and water Wound #4 Left,Posterior Lower  Leg: Cleanse wound with mild soap and water Anesthetic: Wound #1 Left,Proximal Lower Leg: Topical Lidocaine 4% cream applied to wound bed prior to debridement Wound #2 Left,Distal Lower Leg: Topical Lidocaine 4% cream applied to wound bed prior to debridement Wound #4 Left,Posterior Lower Leg: Topical Lidocaine 4% cream applied to wound bed prior to debridement Primary Wound Dressing: Wound #1 Left,Proximal Lower Leg: Acticoat 7 Wound #2 Left,Distal Lower Leg: Acticoat 7 Wound #4 Left,Posterior Lower Leg: Acticoat 7 Secondary Dressing: Wound #1 Left,Proximal Lower Leg: ABD pad Wound #2 Left,Distal Lower Leg: [name], [name] ([id number]) ABD pad Wound #4 Left,Posterior Lower Leg: ABD pad Dressing Change Frequency: Wound #1 Left,Proximal Lower Leg: Change dressing every week Wound #2 Left,Distal Lower Leg: Change dressing every week Wound #4 Left,Posterior Lower Leg: Change dressing every week Follow-up Appointments: Wound #1 Left,Proximal Lower Leg: Return Appointment in 1 week. Wound #2 Left,Distal Lower Leg: Return Appointment in 1 week. Wound #4 Left,Posterior Lower Leg: Return Appointment in 1 week. Edema Control: Wound #1 Left,Proximal Lower Leg: 2 Layer Compression System - Left Lower Extremity Wound #2 Left,Distal Lower Leg: 2 Layer Compression System - Left Lower Extremity Wound #4 Left,Posterior Lower Leg: 2 Layer Compression System - Left Lower Extremity Having treated her for several weeks with debridements and the way this eschar forms back again every week makes me wonder if  her basic problem is something to do with vasculitis rather than just being a venous ulcer. She does have venous incompetence and this may be a mixed picture. I have asked her to talk to her rheumatologist the next time she is there and see if he needs to do any workup regarding a vasculitis. She does have other comorbidities like temporal arteritis and this may be a variant of the same picture. In the meanwhile will continue with Acticoat 7 and a 2 layer compression wrap which is helping her immensely. she will come back and see [place] next week. Electronic Signature(s) Signed: 03/14/2015 12:33:17 PM By: Kristin Fudge MD, [name] Entered By: [name] on 03/14/2015 12:29:10 Foronda, Deatra [name] ([id number]) -------------------------------------------------------------------------------- SuperBill Details Patient Name: FILIPPA, YARBOUGH [name] Date of Service: 03/14/2015 Medical Record Number: [id number] Patient Account Number: [id number] Date of Birth/Sex: [birth year] (68 y.o. 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

## 2015-03-14 NOTE — Progress Notes (Signed)
AUBRIEE, SZETO ([id number]) Visit Report for 03/14/2015 Arrival Information Details Patient Name: [name], [name] Date of Service: 03/14/2015 11:30 AM Medical Record Number: [id number] Patient Account Number: [id number] Date of Birth/Sex: 1946/07/31 (68 y.o. Female) Treating RN: [name] Primary Care Physician: Glendon Axe Other Clinician: Referring Physician: Glendon Axe Treating Physician/Extender: Frann Rider in Treatment: 6 Visit Information History Since Last Visit Added or deleted any medications: No Patient Arrived: Cane Any new allergies or adverse reactions: No Arrival Time: 11:43 Had a fall or experienced change in No Transfer Assistance: None activities of daily living that may affect Patient Identification Verified: Yes risk of falls: Secondary Verification Process Yes Signs or symptoms of abuse/neglect since last No Completed: visito Patient Has Alerts: Yes Hospitalized since last visit: No Patient Alerts: Patient on Blood Pain Present Now: No Thinner Electronic Signature(s) Signed: 03/14/2015 3:22:42 PM By: [name] Entered By: [name] on 03/14/2015 11:44:16 Tabar, Cannie D. ([id number]) -------------------------------------------------------------------------------- Encounter Discharge Information Details Patient Name: [name], [name] Date of Service: 03/14/2015 11:30 AM Medical Record Number: [id number] Patient Account Number: [id number] Date of Birth/Sex: October 12, 1946 (68 y.o. Female) Treating RN: [name] Primary Care Physician: Glendon Axe Other Clinician: Referring Physician: Glendon Axe Treating Physician/Extender: Frann Rider in Treatment: 6 Encounter Discharge Information Items Discharge Pain Level: 0 Discharge Condition: Stable Ambulatory Status: Cane Discharge Destination: Home Transportation: Private Auto Schedule Follow-up Appointment: Yes Medication Reconciliation completed No and provided  to Patient/Care Kortnee Bas: Provided on Clinical Summary of Care: 03/14/2015 Form Type Recipient Paper Patient LM Electronic Signature(s) Signed: 03/14/2015 12:17:05 PM By: Ruthine Dose Entered By: Ruthine Dose on 03/14/2015 12:17:05 Mentzel, Xela D. ([id number]) -------------------------------------------------------------------------------- Lower Extremity Assessment Details Patient Name: Kristin Stare D. Date of Service: 03/14/2015 11:30 AM Medical Record Number: [id number] Patient Account Number: [id number] Date of Birth/Sex: 10/01/1946 (68 y.o. Female) Treating RN: [name] Primary Care Physician: Glendon Axe Other Clinician: Referring Physician: Glendon Axe Treating Physician/Extender: Frann Rider in Treatment: 6 Edema Assessment Assessed: [Left: No] [Right: No] E[Left: dema] [Right: :] Calf Left: Right: Point of Measurement: 33 cm From Medial Instep 34.8 cm cm Ankle Left: Right: Point of Measurement: 12 cm From Medial Instep 21.8 cm cm Vascular Assessment Pulses: Posterior Tibial Palpable: [Left:Yes] Dorsalis Pedis Palpable: [Left:Yes] Extremity colors, hair growth, and conditions: Extremity Color: [Left:Normal] Hair Growth on Extremity: [Left:No] Temperature of Extremity: [Left:Warm] Capillary Refill: [Left:< 3 seconds] Toe Nail Assessment Left: Right: Thick: No Discolored: No Deformed: No Improper Length and Hygiene: No Electronic Signature(s) Signed: 03/14/2015 3:22:42 PM By: [name] Entered By: [name] on 03/14/2015 11:59:20 Hissong, Omega D. ([id number]) Hineman, Renezmae D. ([id number]) -------------------------------------------------------------------------------- Multi Wound Chart Details Patient Name: [name], [name] Date of Service: 03/14/2015 11:30 AM Medical Record Number: [id number] Patient Account Number: [id number] Date of Birth/Sex: 1945-12-27 (68 y.o. Female) Treating RN: [name] Primary Care Physician: Glendon Axe Other Clinician: Referring Physician: Glendon Axe Treating Physician/Extender: Frann Rider in Treatment: 6 Vital Signs Height(in): 65 Pulse(bpm): 78 Weight(lbs): 248 Blood Pressure 137/59 (mmHg): Body Mass Index(BMI): 41 Temperature(F): 97.5 Respiratory Rate 18 (breaths/min): Photos: [1:No Photos] [2:No Photos] [4:No Photos] Wound Location: [1:Left Lower Leg - Proximal] [2:Left Lower Leg - Distal] [4:Left Lower Leg - Posterior] Wounding Event: [1:Blister] [2:Blister] [4:Blister] Primary Etiology: [1:Venous Leg Ulcer] [2:Venous Leg Ulcer] [4:Venous Leg Ulcer] Comorbid History: [1:Cataracts, Asthma, Coronary Artery Disease, Hypertension, Type II Diabetes, Osteoarthritis, Neuropathy] [2:Cataracts, Asthma, Coronary Artery Disease, Hypertension, Type II Diabetes, Osteoarthritis, Neuropathy] [4:Cataracts, Asthma,  Coronary Artery Disease, Hypertension, Type  II Diabetes, Osteoarthritis, Neuropathy] Date Acquired: [1:12/30/2014] [2:12/30/2014] [4:12/30/2014] Weeks of Treatment: [1:6] [2:6] [4:6] Wound Status: [1:Open] [2:Open] [4:Open] Clustered Wound: [1:No] [2:Yes] [4:No] Measurements L x W x D 0.8x0.4x0.1 [2:5.5x2x0.2] [4:0.5x0.3x0.1] (cm) Area (cm) : [1:0.251] [2:8.639] [4:0.118] Volume (cm) : [1:0.025] [2:1.728] [4:0.012] % Reduction in Area: [1:58.00%] [2:-1121.90%] [4:64.20%] % Reduction in Volume: 58.30% [2:-2333.80%] [4:63.60%] Classification: [1:Unclassifiable] [2:Full Thickness Without Exposed Support Structures] [4:Unclassifiable] HBO Classification: [1:Grade 0] [2:Grade 1] [4:Grade 0] Exudate Amount: [1:None Present] [2:None Present] [4:None Present] Wound Margin: [1:Flat and Intact] [2:Flat and Intact] [4:Flat and Intact] Granulation Amount: [1:None Present (0%)] [2:None Present (0%)] [4:None Present (0%)] Necrotic Amount: [1:Large (67-100%)] [2:Large (67-100%)] [4:Large (67-100%)] Necrotic Tissue: [1:Eschar] [2:Eschar] [4:Eschar] Exposed  Structures: [1:Fascia: No Fat: No] [2:Fascia: No Fat: No] [4:Fascia: No Fat: No] Tendon: No Tendon: No Tendon: No Muscle: No Muscle: No Muscle: No Joint: No Joint: No Joint: No Bone: No Bone: No Bone: No Limited to Skin Limited to Skin Limited to Skin Breakdown Breakdown Breakdown Epithelialization: Small (1-33%) None None Periwound Skin Texture: Edema: No Edema: No Edema: No Excoriation: No Excoriation: No Excoriation: No Induration: No Induration: No Induration: No Callus: No Callus: No Callus: No Crepitus: No Crepitus: No Crepitus: No Fluctuance: No Fluctuance: No Fluctuance: No Friable: No Friable: No Friable: No Rash: No Rash: No Rash: No Scarring: No Scarring: No Scarring: No Periwound Skin Dry/Scaly: Yes Moist: Yes Dry/Scaly: Yes Moisture: Maceration: No Dry/Scaly: Yes Maceration: No Moist: No Maceration: No Moist: No Periwound Skin Color: Atrophie Blanche: No Atrophie Blanche: No Hemosiderin Staining: Yes Cyanosis: No Cyanosis: No Atrophie Blanche: No Ecchymosis: No Ecchymosis: No Cyanosis: No Erythema: No Erythema: No Ecchymosis: No Hemosiderin Staining: No Hemosiderin Staining: No Erythema: No Mottled: No Mottled: No Mottled: No Pallor: No Pallor: No Pallor: No Rubor: No Rubor: No Rubor: No Temperature: No Abnormality No Abnormality No Abnormality Tenderness on Yes Yes Yes Palpation: Wound Preparation: Ulcer Cleansing: Other: Ulcer Cleansing: Other: Ulcer Cleansing: Other: soap and water soap and water soap and water Topical Anesthetic Topical Anesthetic Topical Anesthetic Applied: Other: lidocaine Applied: Other: lidocaine Applied: Other: lidocaine 4% 4% 4% Treatment Notes Electronic Signature(s) Signed: 03/14/2015 3:22:42 PM By: [name] Entered By: [name] on 03/14/2015 12:06:50 Ensz, Kyonna D. ([id number]) -------------------------------------------------------------------------------- Leonard Details Patient Name: [name], [name] Date of Service: 03/14/2015 11:30 AM Medical Record Number: [id number] Patient Account Number: [id number] Date of Birth/Sex: 11/20/1945 (68 y.o. Female) Treating RN: [name] Primary Care Physician: Glendon Axe Other Clinician: Referring Physician: Glendon Axe Treating Physician/Extender: Frann Rider in Treatment: 6 Active Inactive Orientation to the Wound Care Program Nursing Diagnoses: Knowledge deficit related to the wound healing center program Goals: Patient/caregiver will verbalize understanding of the [facility] Program Date Initiated: 01/31/2015 Goal Status: Active Interventions: Provide education on orientation to the wound center Notes: Venous Leg Ulcer Nursing Diagnoses: Potential for venous Insuffiency (use before diagnosis confirmed) Goals: Non-invasive venous studies are completed as ordered Date Initiated: 01/31/2015 Goal Status: Active Patient/caregiver will verbalize understanding of disease process and disease management Date Initiated: 01/31/2015 Goal Status: Active Interventions: Assess peripheral edema status every visit. Notes: Wound/Skin Impairment Nursing Diagnoses: Impaired tissue integrity Knowledge deficit related to smoking impact on wound healing Jacot, Meagon D. ([id number]) Goals: Patient/caregiver will verbalize understanding of skin care regimen Date Initiated: 01/31/2015 Goal Status: Active Ulcer/skin breakdown will heal within 14 weeks Date Initiated: 01/31/2015 Goal Status: Active Interventions: Assess ulceration(s) every visit Notes: Electronic Signature(s) Signed: 03/14/2015 3:22:42 PM By: [name]  Entered By: [name] on 03/14/2015 12:06:40 Blevens, Sudie D. ([id number]) -------------------------------------------------------------------------------- Patient/Caregiver Education Details Patient Name: Kristin Stare D. Date of Service: 03/14/2015 11:30  AM Medical Record Number: [id number] Patient Account Number: [id number] Date of Birth/Gender: 09/23/1946 (68 y.o. Female) Treating RN: [name] Primary Care Physician: Glendon Axe Other Clinician: Referring Physician: Glendon Axe Treating Physician/Extender: Frann Rider in Treatment: 6 Education Assessment Education Provided To: Patient Education Topics Provided Wound/Skin Impairment: Handouts: Other: call if wrap slips Methods: Explain/Verbal Responses: State content correctly Electronic Signature(s) Signed: 03/14/2015 3:22:42 PM By: [name] Entered By: [name] on 03/14/2015 12:07:33 Mccreedy, Nicha D. ([id number]) -------------------------------------------------------------------------------- Wound Assessment Details Patient Name: [name], [name] Date of Service: 03/14/2015 11:30 AM Medical Record Number: [id number] Patient Account Number: [id number] Date of Birth/Sex: 31-Mar-1946 (68 y.o. Female) Treating RN: [name] Primary Care Physician: Glendon Axe Other Clinician: Referring Physician: Glendon Axe Treating Physician/Extender: Frann Rider in Treatment: 6 Wound Status Wound Number: 1 Primary Venous Leg Ulcer Etiology: Wound Location: Left Lower Leg - Proximal Wound Open Wounding Event: Blister Status: Date Acquired: 12/30/2014 Comorbid Cataracts, Asthma, Coronary Artery Weeks Of Treatment: 6 History: Disease, Hypertension, Type II Clustered Wound: No Diabetes, Osteoarthritis, Neuropathy Photos Photo Uploaded By: [name] on 03/14/2015 13:10:49 Wound Measurements Length: (cm) 0.8 Width: (cm) 0.4 Depth: (cm) 0.1 Area: (cm) 0.251 Volume: (cm) 0.025 % Reduction in Area: 58% % Reduction in Volume: 58.3% Epithelialization: Small (1-33%) Tunneling: No Undermining: No Wound Description Classification: Unclassifiable Diabetic Severity Earleen Newport): Grade 0 Wound Margin: Flat and Intact Exudate Amount: None  Present Foul Odor After Cleansing: No Wound Bed Granulation Amount: None Present (0%) Exposed Structure Necrotic Amount: Large (67-100%) Fascia Exposed: No Necrotic Quality: Eschar Fat Layer Exposed: No Tendon Exposed: No Muscle Exposed: No [place], Seraya D. ([id number]) Joint Exposed: No Bone Exposed: No Limited to Skin Breakdown Periwound Skin Texture Texture Color No Abnormalities Noted: No No Abnormalities Noted: No Callus: No Atrophie Blanche: No Crepitus: No Cyanosis: No Excoriation: No Ecchymosis: No Fluctuance: No Erythema: No Friable: No Hemosiderin Staining: No Induration: No Mottled: No Localized Edema: No Pallor: No Rash: No Rubor: No Scarring: No Temperature / Pain Moisture Temperature: No Abnormality No Abnormalities Noted: No Tenderness on Palpation: Yes Dry / Scaly: Yes Maceration: No Moist: No Wound Preparation Ulcer Cleansing: Other: soap and water, Topical Anesthetic Applied: Other: lidocaine 4%, Treatment Notes Wound #1 (Left, Proximal Lower Leg) 1. Cleansed with: Cleanse wound with antibacterial soap and water 2. Anesthetic Topical Lidocaine 4% cream to wound bed prior to debridement 4. Dressing Applied: Acticoat 7 5. Secondary Dressing Applied ABD Pad 7. Secured with Tape 2 Layer Compression System - Left Lower Extremity Electronic Signature(s) Signed: 03/14/2015 12:04:03 PM By: [name] Entered By: [name] on 03/14/2015 12:04:03 Mcmanigal, Velta D. ([id number]) -------------------------------------------------------------------------------- Wound Assessment Details Patient Name: [name], [name] Date of Service: 03/14/2015 11:30 AM Medical Record Number: [id number] Patient Account Number: [id number] Date of Birth/Sex: July 17, 1946 (68 y.o. Female) Treating RN: [name] Primary Care Physician: Glendon Axe Other Clinician: Referring Physician: Glendon Axe Treating Physician/Extender: Frann Rider in  Treatment: 6 Wound Status Wound Number: 2 Primary Venous Leg Ulcer Etiology: Wound Location: Left Lower Leg - Distal Wound Open Wounding Event: Blister Status: Date Acquired: 12/30/2014 Comorbid Cataracts, Asthma, Coronary Artery Weeks Of Treatment: 6 History: Disease, Hypertension, Type II Clustered Wound: Yes Diabetes, Osteoarthritis, Neuropathy Photos Photo Uploaded By: [name] on 03/14/2015 13:10:50 Wound Measurements Length: (cm) 5.5 Width: (cm) 2 Depth: (cm) 0.2 Area: (cm) 8.639  Volume: (cm) 1.728 % Reduction in Area: -1121.9% % Reduction in Volume: -2333.8% Epithelialization: None Tunneling: No Undermining: No Wound Description Full Thickness Without Classification: Exposed Support Structures Diabetic Severity Grade 1 (Wagner): Wound Margin: Flat and Intact Exudate Amount: None Present Foul Odor After Cleansing: No Wound Bed Granulation Amount: None Present (0%) Exposed Structure Necrotic Amount: Large (67-100%) Fascia Exposed: No Necrotic Quality: Eschar Fat Layer Exposed: No Barua, Bellah D. ([id number]) Tendon Exposed: No Muscle Exposed: No Joint Exposed: No Bone Exposed: No Limited to Skin Breakdown Periwound Skin Texture Texture Color No Abnormalities Noted: No No Abnormalities Noted: No Callus: No Atrophie Blanche: No Crepitus: No Cyanosis: No Excoriation: No Ecchymosis: No Fluctuance: No Erythema: No Friable: No Hemosiderin Staining: No Induration: No Mottled: No Localized Edema: No Pallor: No Rash: No Rubor: No Scarring: No Temperature / Pain Moisture Temperature: No Abnormality No Abnormalities Noted: No Tenderness on Palpation: Yes Dry / Scaly: Yes Maceration: No Moist: Yes Wound Preparation Ulcer Cleansing: Other: soap and water, Topical Anesthetic Applied: Other: lidocaine 4%, Treatment Notes Wound #2 (Left, Distal Lower Leg) 1. Cleansed with: Cleanse wound with antibacterial soap and water 2.  Anesthetic Topical Lidocaine 4% cream to wound bed prior to debridement 4. Dressing Applied: Acticoat 7 5. Secondary Dressing Applied ABD Pad 7. Secured with Tape 2 Layer Compression System - Left Lower Extremity Electronic Signature(s) Signed: 03/14/2015 12:04:24 PM By: [name] Entered By: [name] on 03/14/2015 12:04:24 [name], [name] ([id number]) [name], [name] ([id number]) -------------------------------------------------------------------------------- Wound Assessment Details Patient Name: [name], [name] Date of Service: 03/14/2015 11:30 AM Medical Record Number: [id number] Patient Account Number: [id number] Date of Birth/Sex: [birth year] (68 y.o. Female) Treating RN: [name] Primary Care Physician: Glendon Axe Other Clinician: Referring Physician: Glendon Axe Treating Physician/Extender: Frann Rider in Treatment: 6 Wound Status Wound Number: 4 Primary Venous Leg Ulcer Etiology: Wound Location: Left Lower Leg - Posterior Wound Open Wounding Event: Blister Status: Date Acquired: 12/30/2014 Comorbid Cataracts, Asthma, Coronary Artery Weeks Of Treatment: 6 History: Disease, Hypertension, Type II Clustered Wound: No Diabetes, Osteoarthritis, Neuropathy Photos Photo Uploaded By: [name] on 03/14/2015 13:11:09 Wound Measurements Length: (cm) 0.5 Width: (cm) 0.3 Depth: (cm) 0.1 Area: (cm) 0.118 Volume: (cm) 0.012 % Reduction in Area: 64.2% % Reduction in Volume: 63.6% Epithelialization: None Tunneling: No Undermining: No Wound Description Classification: Unclassifiable Foul Odor A Diabetic Severity (Wagner): Grade 0 Wound Margin: Flat and Intact Exudate Amount: None Present fter Cleansing: No Wound Bed Granulation Amount: None Present (0%) Exposed Structure Necrotic Amount: Large (67-100%) Fascia Exposed: No Necrotic Quality: Eschar Fat Layer Exposed: No Tendon Exposed: No Muscle Exposed: No [name], [name]  ([id number]) Joint Exposed: No Bone Exposed: No Limited to Skin Breakdown Periwound Skin Texture Texture Color No Abnormalities Noted: No No Abnormalities Noted: No Callus: No Atrophie Blanche: No Crepitus: No Cyanosis: No Excoriation: No Ecchymosis: No Fluctuance: No Erythema: No Friable: No Hemosiderin Staining: Yes Induration: No Mottled: No Localized Edema: No Pallor: No Rash: No Rubor: No Scarring: No Temperature / Pain Moisture Temperature: No Abnormality No Abnormalities Noted: No Tenderness on Palpation: Yes Dry / Scaly: Yes Maceration: No Moist: No Wound Preparation Ulcer Cleansing: Other: soap and water, Topical Anesthetic Applied: Other: lidocaine 4%, Treatment Notes Wound #4 (Left, Posterior Lower Leg) 1. Cleansed with: Cleanse wound with antibacterial soap and water 2. Anesthetic Topical Lidocaine 4% cream to wound bed prior to debridement 4. Dressing Applied: Acticoat 7 5. Secondary Dressing Applied ABD Pad 7. Secured with Tape 2 Layer Compression System -  Left Lower Extremity Electronic Signature(s) Signed: 03/14/2015 12:04:44 PM By: [name] Entered By: [name] on 03/14/2015 12:04:44 [name], [name] ([id number]) -------------------------------------------------------------------------------- Vitals Details Patient Name: [name], ROBLEY D. Date of Service: 03/14/2015 11:30 AM Medical Record Number: [id number] Patient Account Number: [id number] Date of Birth/Sex: 08-09-1946 (68 y.o. Female) Treating RN: [name] Primary Care Physician: Glendon Axe Other Clinician: Referring Physician: Glendon Axe Treating Physician/Extender: Frann Rider in Treatment: 6 Vital Signs Time Taken: 11:55 Temperature (F): 97.5 Height (in): 65 Pulse (bpm): 78 Weight (lbs): 248 Respiratory Rate (breaths/min): 18 Body Mass Index (BMI): 41.3 Blood Pressure (mmHg): 137/59 Reference Range: 80 - 120 mg / dl Electronic  Signature(s) Signed: 03/14/2015 3:22:42 PM By: [name] Entered By: [name] on 03/14/2015 11:57:40

## 2015-03-19 DIAGNOSIS — N184 Chronic kidney disease, stage 4 (severe): Secondary | ICD-10-CM | POA: Insufficient documentation

## 2015-03-20 ENCOUNTER — Encounter: Payer: PPO | Admitting: Surgery

## 2015-03-20 DIAGNOSIS — L97322 Non-pressure chronic ulcer of left ankle with fat layer exposed: Secondary | ICD-10-CM | POA: Diagnosis not present

## 2015-03-20 NOTE — Progress Notes (Addendum)
[name] ([id number]) Visit Report for 03/20/2015 Arrival Information Details Patient Name: [name], [name] Date of Service: 03/20/2015 10:15 AM Medical Record Number: [id number] Patient Account Number: [id number] Date of Birth/Sex: 03-21-1946 (68 y.o. Female) Treating RN: [name], RN, [name], [name] Primary Care Physician: Glendon Axe Other Clinician: Referring Physician: Glendon Axe Treating Physician/Extender: Frann Rider in Treatment: 6 Visit Information History Since Last Visit Any new allergies or adverse reactions: No Patient Arrived: [name] Had a fall or experienced change in No Arrival Time: 10:48 activities of daily living that may affect Accompanied By: grddtr risk of falls: Transfer Assistance: None Signs or symptoms of abuse/neglect since last No Patient Identification Verified: Yes visito Secondary Verification Process Yes Hospitalized since last visit: No Completed: Has Dressing in Place as Prescribed: Yes Patient Has Alerts: Yes Has Compression in Place as Prescribed: Yes Patient Alerts: Patient on Blood Pain Present Now: No Thinner Electronic Signature(s) Signed: 03/20/2015 11:43:39 AM By: Regan Lemming [name], RN Entered By: Regan Lemming on 03/20/2015 10:49:21 [name], [name] ([id number]) -------------------------------------------------------------------------------- Encounter Discharge Information Details Patient Name: [name], [name] Date of Service: 03/20/2015 10:15 AM Medical Record Number: [id number] Patient Account Number: [id number] Date of Birth/Sex: 10-17-1946 (68 y.o. Female) Treating RN: [name], RN, [name], [name] Primary Care Physician: Glendon Axe Other Clinician: Referring Physician: Glendon Axe Treating Physician/Extender: Frann Rider in Treatment: 6 Encounter Discharge Information Items Discharge Pain Level: 0 Discharge Condition: Stable Ambulatory Status: Ambulatory Discharge Destination: Home Transportation: Private  Auto Accompanied By: grddtr Schedule Follow-up Appointment: No Medication Reconciliation completed and provided to Patient/Care No [name]: Provided on Clinical Summary of Care: 03/20/2015 Form Type Recipient Paper Patient LM Electronic Signature(s) Signed: 03/20/2015 11:43:39 AM By: Regan Lemming [name], RN Previous Signature: 03/20/2015 11:20:11 AM Version By: Ruthine Dose Entered By: Regan Lemming on 03/20/2015 11:22:29 Stripling, [name] ([id number]) -------------------------------------------------------------------------------- Lower Extremity Assessment Details Patient Name: [name], [name] Date of Service: 03/20/2015 10:15 AM Medical Record Number: [id number] Patient Account Number: [id number] Date of Birth/Sex: Feb 04, 1946 (68 y.o. Female) Treating RN: [name], RN, [name], [employer] Primary Care Physician: Glendon Axe Other Clinician: Referring Physician: Glendon Axe Treating Physician/Extender: Frann Rider in Treatment: 6 Edema Assessment Assessed: [Left: No] [Right: No] E[Left: dema] [Right: :] Calf Left: Right: Point of Measurement: 33 cm From Medial Instep 34.6 cm cm Ankle Left: Right: Point of Measurement: 12 cm From Medial Instep 20.5 cm cm Vascular Assessment Claudication: Claudication Assessment [Left:None] Pulses: Posterior Tibial Dorsalis Pedis Palpable: [Left:Yes] Extremity colors, hair growth, and conditions: Extremity Color: [Left:Mottled] Hair Growth on Extremity: [Left:Yes] Temperature of Extremity: [Left:Warm] Capillary Refill: [Left:< 3 seconds] Dependent Rubor: [Left:No] Blanched when Elevated: [Left:No] Lipodermatosclerosis: [Left:No] Toe Nail Assessment Left: Right: Thick: No Discolored: No Deformed: No Improper Length and Hygiene: No [name] ([id number]) Electronic Signature(s) Signed: 03/20/2015 11:43:39 AM By: Regan Lemming [name], RN Entered By: Regan Lemming on 03/20/2015 10:55:18 [name], [name]  ([id number]) -------------------------------------------------------------------------------- Multi Wound Chart Details Patient Name: Kristin Stare D. Date of Service: 03/20/2015 10:15 AM Medical Record Number: [id number] Patient Account Number: [id number] Date of Birth/Sex: 1946-06-24 (68 y.o. Female) Treating RN: Baruch Gouty, RN, [name], [name] Primary Care Physician: Glendon Axe Other Clinician: Referring Physician: Glendon Axe Treating Physician/Extender: Frann Rider in Treatment: 6 Vital Signs Height(in): 65 Pulse(bpm): 65 Weight(lbs): 248 Blood Pressure 120/50 (mmHg): Body Mass Index(BMI): 41 Temperature(F): 97.7 Respiratory Rate 16 (breaths/min): Photos: [1:No Photos] [2:No Photos] [4:No Photos] Wound Location: [1:Left Lower Leg - Proximal] [2:Left Lower Leg - Distal] [4:Left Lower Leg - Posterior]  Wounding Event: [1:Blister] [2:Blister] [4:Blister] Primary Etiology: [1:Venous Leg Ulcer] [2:Venous Leg Ulcer] [4:Venous Leg Ulcer] Comorbid History: [1:Cataracts, Asthma, Coronary Artery Disease, Hypertension, Type II Diabetes, Osteoarthritis, Neuropathy] [2:Cataracts, Asthma, Coronary Artery Disease, Hypertension, Type II Diabetes, Osteoarthritis, Neuropathy] [4:Cataracts, Asthma,  Coronary Artery Disease, Hypertension, Type II Diabetes, Osteoarthritis, Neuropathy] Date Acquired: [1:12/30/2014] [2:12/30/2014] [4:12/30/2014] Weeks of Treatment: [1:6] [2:6] [4:6] Wound Status: [1:Open] [2:Open] [4:Open] Clustered Wound: [1:No] [2:Yes] [4:No] Measurements L x W x D 0.7x0.4x0.1 [2:5.6x2x0.1] [4:0.5x0.3x0.1] (cm) Area (cm) : [1:0.22] [7:8.295] [4:0.118] Volume (cm) : [1:0.022] [2:0.88] [4:0.012] % Reduction in Area: [1:63.10%] [2:-1144.10%] [4:64.20%] % Reduction in Volume: 63.30% [2:-1139.40%] [4:63.60%] Classification: [1:Unclassifiable] [2:Full Thickness Without Exposed Support Structures] [4:Unclassifiable] HBO Classification: [1:Grade 0] [2:Grade 1] [4:Grade 0] Exudate  Amount: [1:None Present] [2:None Present] [4:None Present] Wound Margin: [1:Flat and Intact] [2:Flat and Intact] [4:Flat and Intact] Granulation Amount: [1:None Present (0%)] [2:None Present (0%)] [4:None Present (0%)] Necrotic Amount: [1:Large (67-100%)] [2:Large (67-100%)] [4:Large (67-100%)] Necrotic Tissue: [1:Eschar] [2:Eschar] [4:Eschar] Exposed Structures: [1:Fascia: No Fat: No] [2:Fascia: No Fat: No] [4:Fascia: No Fat: No] Tendon: No Tendon: No Tendon: No Muscle: No Muscle: No Muscle: No Joint: No Joint: No Joint: No Bone: No Bone: No Bone: No Limited to Skin Limited to Skin Limited to Skin Breakdown Breakdown Breakdown Epithelialization: Small (1-33%) None None Periwound Skin Texture: Edema: No Edema: No Edema: No Excoriation: No Excoriation: No Excoriation: No Induration: No Induration: No Induration: No Callus: No Callus: No Callus: No Crepitus: No Crepitus: No Crepitus: No Fluctuance: No Fluctuance: No Fluctuance: No Friable: No Friable: No Friable: No Rash: No Rash: No Rash: No Scarring: No Scarring: No Scarring: No Periwound Skin Dry/Scaly: Yes Dry/Scaly: Yes Dry/Scaly: Yes Moisture: Maceration: No Maceration: No Maceration: No Moist: No Moist: No Moist: No Periwound Skin Color: Atrophie Blanche: No Atrophie Blanche: No Hemosiderin Staining: Yes Cyanosis: No Cyanosis: No Atrophie Blanche: No Ecchymosis: No Ecchymosis: No Cyanosis: No Erythema: No Erythema: No Ecchymosis: No Hemosiderin Staining: No Hemosiderin Staining: No Erythema: No Mottled: No Mottled: No Mottled: No Pallor: No Pallor: No Pallor: No Rubor: No Rubor: No Rubor: No Temperature: No Abnormality No Abnormality No Abnormality Tenderness on Yes Yes Yes Palpation: Wound Preparation: Ulcer Cleansing: Other: Ulcer Cleansing: Other: Ulcer Cleansing: Other: soap and water soap and water soap and water Topical Anesthetic Topical Anesthetic Topical  Anesthetic Applied: Other: lidocaine Applied: Other: lidocaine Applied: Other: lidocaine 4% 4% 4% Treatment Notes Electronic Signature(s) Signed: 03/20/2015 11:43:39 AM By: Regan Lemming [name], RN Entered By: Regan Lemming on 03/20/2015 11:08:44 Leeson, Oriyah DMarland Kitchen ([id number]) -------------------------------------------------------------------------------- [place] Details Patient Name: SREENIDHI, GANSON D. Date of Service: 03/20/2015 10:15 AM Medical Record Number: [id number] Patient Account Number: [id number] Date of Birth/Sex: 07-May-1946 (68 y.o. Female) Treating RN: [name], RN, [name], [name] Primary Care Physician: Glendon Axe Other Clinician: Referring Physician: Glendon Axe Treating Physician/Extender: Frann Rider in Treatment: 6 Active Inactive Orientation to the Wound Care Program Nursing Diagnoses: Knowledge deficit related to the wound healing center program Goals: Patient/caregiver will verbalize understanding of the [facility] Date Initiated: 01/31/2015 Goal Status: Active Interventions: Provide education on orientation to the wound center Notes: Venous Leg Ulcer Nursing Diagnoses: Potential for venous Insuffiency (use before diagnosis confirmed) Goals: Non-invasive venous studies are completed as ordered Date Initiated: 01/31/2015 Goal Status: Active Patient/caregiver will verbalize understanding of disease process and disease management Date Initiated: 01/31/2015 Goal Status: Active Interventions: Assess peripheral edema status every visit. Notes: Wound/Skin Impairment Nursing Diagnoses: Impaired tissue integrity Knowledge deficit related to  smoking impact on wound healing Loveless, Shawnae D. ([id number]) Goals: Patient/caregiver will verbalize understanding of skin care regimen Date Initiated: 01/31/2015 Goal Status: Active Ulcer/skin breakdown will heal within 14 weeks Date Initiated: 01/31/2015 Goal Status:  Active Interventions: Assess ulceration(s) every visit Notes: Electronic Signature(s) Signed: 03/20/2015 11:43:39 AM By: Regan Lemming [name], RN Entered By: Regan Lemming on 03/20/2015 11:08:34 Hayne, Aalayah D. ([id number]) -------------------------------------------------------------------------------- Pain Assessment Details Patient Name: Kristin Stare D. Date of Service: 03/20/2015 10:15 AM Medical Record Number: [id number] Patient Account Number: [id number] Date of Birth/Sex: 1946-08-23 (68 y.o. Female) Treating RN: Baruch Gouty, RN, [name], [name] Primary Care Physician: Glendon Axe Other Clinician: Referring Physician: Glendon Axe Treating Physician/Extender: Frann Rider in Treatment: 6 Active Problems Location of Pain Severity and Description of Pain Patient Has Paino No Site Locations Pain Management and Medication Current Pain Management: Electronic Signature(s) Signed: 03/20/2015 11:43:39 AM By: Regan Lemming [name], RN Entered By: Regan Lemming on 03/20/2015 10:49:31 Cordle, Tia Masker ([id number]) -------------------------------------------------------------------------------- Patient/Caregiver Education Details Patient Name: YARIELYS, BEED D. Date of Service: 03/20/2015 10:15 AM Medical Record Number: [id number] Patient Account Number: [id number] Date of Birth/Gender: 1946-07-31 (68 y.o. Female) Treating RN: Baruch Gouty, RN, [name], [name] Primary Care Physician: Glendon Axe Other Clinician: Referring Physician: Glendon Axe Treating Physician/Extender: Frann Rider in Treatment: 6 Education Assessment Education Provided To: Patient Education Topics Provided Wound/Skin Impairment: Handouts: Other: vasculitis Methods: Explain/Verbal Responses: State content correctly Electronic Signature(s) Signed: 03/20/2015 11:43:39 AM By: Regan Lemming [name], RN Entered By: Regan Lemming on 03/20/2015 11:09:38 Ruz, Hollis D.  ([id number]) -------------------------------------------------------------------------------- Wound Assessment Details Patient Name: Kristin Stare D. Date of Service: 03/20/2015 10:15 AM Medical Record Number: [id number] Patient Account Number: [id number] Date of Birth/Sex: 30-Jan-1946 (68 y.o. Female) Treating RN: [name], RN, [name], [place] Primary Care Physician: Glendon Axe Other Clinician: Referring Physician: Glendon Axe Treating Physician/Extender: Frann Rider in Treatment: 6 Wound Status Wound Number: 1 Primary Venous Leg Ulcer Etiology: Wound Location: Left Lower Leg - Proximal Wound Open Wounding Event: Blister Status: Date Acquired: 12/30/2014 Comorbid Cataracts, Asthma, Coronary Artery Weeks Of Treatment: 6 History: Disease, Hypertension, Type II Clustered Wound: No Diabetes, Osteoarthritis, Neuropathy Photos Photo Uploaded By: Regan Lemming on 03/20/2015 11:29:44 Wound Measurements Length: (cm) 0.7 Width: (cm) 0.4 Depth: (cm) 0.1 Area: (cm) 0.22 Volume: (cm) 0.022 % Reduction in Area: 63.1% % Reduction in Volume: 63.3% Epithelialization: Small (1-33%) Wound Description Classification: Unclassifiable Diabetic Severity Earleen [place]): Grade 0 Wound Margin: Flat and Intact Exudate Amount: None Present Foul Odor After Cleansing: No Wound Bed Granulation Amount: None Present (0%) Exposed Structure Necrotic Amount: Large (67-100%) Fascia Exposed: No Necrotic Quality: Eschar Fat Layer Exposed: No Tendon Exposed: No Muscle Exposed: No Badgett, Marabeth D. ([id number]) Joint Exposed: No Bone Exposed: No Limited to Skin Breakdown Periwound Skin Texture Texture Color No Abnormalities Noted: No No Abnormalities Noted: No Callus: No Atrophie Blanche: No Crepitus: No Cyanosis: No Excoriation: No Ecchymosis: No Fluctuance: No Erythema: No Friable: No Hemosiderin Staining: No Induration: No Mottled: No Localized Edema: No Pallor: No Rash: No Rubor:  No Scarring: No Temperature / Pain Moisture Temperature: No Abnormality No Abnormalities Noted: No Tenderness on Palpation: Yes Dry / Scaly: Yes Maceration: No Moist: No Wound Preparation Ulcer Cleansing: Other: soap and water, Topical Anesthetic Applied: Other: lidocaine 4%, Treatment Notes Wound #1 (Left, Proximal Lower Leg) 1. Cleansed with: Cleanse wound with antibacterial soap and water 2. Anesthetic Topical Lidocaine 4% cream to wound bed prior to debridement 4. Dressing Applied: Acticoat 7 5. Secondary Dressing Applied ABD Pad 7.  Secured with Tape 2 Layer Compression System - Left Lower Extremity Electronic Signature(s) Signed: 03/20/2015 11:43:39 AM By: Regan Lemming [name], RN Entered By: Regan Lemming on 03/20/2015 11:01:29 Foucher, Shalae D. ([id number]) -------------------------------------------------------------------------------- Wound Assessment Details Patient Name: ANNYA, LIZANA D. Date of Service: 03/20/2015 10:15 AM Medical Record Number: [id number] Patient Account Number: [id number] Date of Birth/Sex: October 14, 1946 (68 y.o. Female) Treating RN: [name], RN, [name], [place] Primary Care Physician: Glendon Axe Other Clinician: Referring Physician: Glendon Axe Treating Physician/Extender: Frann Rider in Treatment: 6 Wound Status Wound Number: 2 Primary Venous Leg Ulcer Etiology: Wound Location: Left Lower Leg - Distal Wound Open Wounding Event: Blister Status: Date Acquired: 12/30/2014 Comorbid Cataracts, Asthma, Coronary Artery Weeks Of Treatment: 6 History: Disease, Hypertension, Type II Clustered Wound: Yes Diabetes, Osteoarthritis, Neuropathy Photos Photo Uploaded By: Regan Lemming on 03/20/2015 11:29:45 Wound Measurements Length: (cm) 5.6 Width: (cm) 2 Depth: (cm) 0.1 Area: (cm) 8.796 Volume: (cm) 0.88 % Reduction in Area: -1144.1% % Reduction in Volume: -1139.4% Epithelialization: None Wound Description Full Thickness  Without Classification: Exposed Support Structures Diabetic Severity Grade 1 (Wagner): Wound Margin: Flat and Intact Exudate Amount: None Present Foul Odor After Cleansing: No Wound Bed Granulation Amount: None Present (0%) Exposed Structure Necrotic Amount: Large (67-100%) Fascia Exposed: No Necrotic Quality: Eschar Fat Layer Exposed: No Bullinger, Arienne D. ([id number]) Tendon Exposed: No Muscle Exposed: No Joint Exposed: No Bone Exposed: No Limited to Skin Breakdown Periwound Skin Texture Texture Color No Abnormalities Noted: No No Abnormalities Noted: No Callus: No Atrophie Blanche: No Crepitus: No Cyanosis: No Excoriation: No Ecchymosis: No Fluctuance: No Erythema: No Friable: No Hemosiderin Staining: No Induration: No Mottled: No Localized Edema: No Pallor: No Rash: No Rubor: No Scarring: No Temperature / Pain Moisture Temperature: No Abnormality No Abnormalities Noted: No Tenderness on Palpation: Yes Dry / Scaly: Yes Maceration: No Moist: No Wound Preparation Ulcer Cleansing: Other: soap and water, Topical Anesthetic Applied: Other: lidocaine 4%, Treatment Notes Wound #2 (Left, Distal Lower Leg) 1. Cleansed with: Cleanse wound with antibacterial soap and water 2. Anesthetic Topical Lidocaine 4% cream to wound bed prior to debridement 4. Dressing Applied: Acticoat 7 5. Secondary Dressing Applied ABD Pad 7. Secured with Tape 2 Layer Compression System - Left Lower Extremity Electronic Signature(s) Signed: 03/20/2015 11:43:39 AM By: Regan Lemming [name], RN Entered By: Regan Lemming on 03/20/2015 11:01:47 Boakye, Anniya D. ([id number]) Mcnabb, Cerise D. ([id number]) -------------------------------------------------------------------------------- Wound Assessment Details Patient Name: Hendry, Ramiyah D. Date of Service: 03/20/2015 10:15 AM Medical Record Number: [id number] Patient Account Number: [id number] Date of Birth/Sex: 1946-04-27 (68 y.o. Female) Treating  RN: [name], RN, [name], [place] Primary Care Physician: Glendon Axe Other Clinician: Referring Physician: Glendon Axe Treating Physician/Extender: Frann Rider in Treatment: 6 Wound Status Wound Number: 4 Primary Venous Leg Ulcer Etiology: Wound Location: Left Lower Leg - Posterior Wound Open Wounding Event: Blister Status: Date Acquired: 12/30/2014 Comorbid Cataracts, Asthma, Coronary Artery Weeks Of Treatment: 6 History: Disease, Hypertension, Type II Clustered Wound: No Diabetes, Osteoarthritis, Neuropathy Photos Photo Uploaded By: Regan Lemming on 03/20/2015 11:30:34 Wound Measurements Length: (cm) 0.5 Width: (cm) 0.3 Depth: (cm) 0.1 Area: (cm) 0.118 Volume: (cm) 0.012 % Reduction in Area: 64.2% % Reduction in Volume: 63.6% Epithelialization: None Wound Description Classification: Unclassifiable Foul Odor A Diabetic Severity (Wagner): Grade 0 Wound Margin: Flat and Intact Exudate Amount: None Present fter Cleansing: No Wound Bed Granulation Amount: None Present (0%) Exposed Structure Necrotic Amount: Large (67-100%) Fascia Exposed: No Necrotic Quality: Eschar Fat Layer Exposed: No Tendon Exposed: No  Muscle Exposed: No Gayheart, Leandrea D. ([id number]) Joint Exposed: No Bone Exposed: No Limited to Skin Breakdown Periwound Skin Texture Texture Color No Abnormalities Noted: No No Abnormalities Noted: No Callus: No Atrophie Blanche: No Crepitus: No Cyanosis: No Excoriation: No Ecchymosis: No Fluctuance: No Erythema: No Friable: No Hemosiderin Staining: Yes Induration: No Mottled: No Localized Edema: No Pallor: No Rash: No Rubor: No Scarring: No Temperature / Pain Moisture Temperature: No Abnormality No Abnormalities Noted: No Tenderness on Palpation: Yes Dry / Scaly: Yes Maceration: No Moist: No Wound Preparation Ulcer Cleansing: Other: soap and water, Topical Anesthetic Applied: Other: lidocaine 4%, Treatment Notes Wound #4 (Left,  Posterior Lower Leg) 1. Cleansed with: Cleanse wound with antibacterial soap and water 2. Anesthetic Topical Lidocaine 4% cream to wound bed prior to debridement 4. Dressing Applied: Acticoat 7 5. Secondary Dressing Applied ABD Pad 7. Secured with Tape 2 Layer Compression System - Left Lower Extremity Electronic Signature(s) Signed: 03/20/2015 11:43:39 AM By: Regan Lemming [name], RN Entered By: Regan Lemming on 03/20/2015 11:02:03 Mcelhannon, Keia D. ([id number]) -------------------------------------------------------------------------------- [place] Details Patient Name: Kristin Stare D. Date of Service: 03/20/2015 10:15 AM Medical Record Number: [id number] Patient Account Number: [id number] Date of Birth/Sex: 02-27-1946 (68 y.o. Female) Treating RN: [name], RN, [name], [place] Primary Care Physician: Glendon Axe Other Clinician: Referring Physician: Glendon Axe Treating Physician/Extender: Frann Rider in Treatment: 6 Vital Signs Time Taken: 10:52 Temperature (F): 97.7 Height (in): 65 Pulse (bpm): 65 Weight (lbs): 248 Respiratory Rate (breaths/min): 16 Body Mass Index (BMI): 41.3 Blood Pressure (mmHg): 120/50 Reference Range: 80 - 120 mg / dl Electronic Signature(s) Signed: 03/20/2015 11:43:39 AM By: Regan Lemming [name], RN Entered By: Regan Lemming on 03/20/2015 10:51:54

## 2015-03-25 NOTE — Progress Notes (Signed)
TRENEE, IGOE ([id number]) Visit Report for 03/20/2015 Chief Complaint Document Details Patient Name: [name] Date of Service: 03/20/2015 10:15 AM Medical Record Patient Account Number: [id number] Number: Treating RN: 1946/08/27 (69 y.o. Other Clinician: Date of Birth/Sex: Female) Treating BURNS III, Primary Care Physician/Extender: Lauralyn Primes, [place] Physician: Referring Physician: Glendon Axe Weeks in Treatment: 6 Information Obtained from: Patient Chief Complaint Patient presents to the wound care center for a consult due non healing wound 69 year old patient comes with a history of having a ulcer on the left lower extremity for the past 4 weeks. she says she's had swelling of both lower extremities for about a year after she started having prednisone. 02/07/2015 -- her vascular appointments obtained were in the first and third week of June. she is able to go to [place] and we will try and get her some earlier appointments. Other than that nothing else has changed in her management. Electronic Signature(s) Signed: 03/20/2015 4:25:37 PM By: Loletha Grayer MD Entered By: Loletha Grayer on 03/20/2015 12:56:41 Heberle, [name] ([id number]) -------------------------------------------------------------------------------- HPI Details Patient Name: [name], [name] Date of Service: 03/20/2015 10:15 AM Medical Record Patient Account Number: [id number] Number: Treating RN: 17-Apr-1946 (69 y.o. Other Clinician: Date of Birth/Sex: Female) Treating BURNS III, Primary Care Physician/Extender: Lauralyn Primes, [place] Physician: Referring Physician: Glendon Axe Weeks in Treatment: 6 History of Present Illness HPI Description: 69 year old patient who is known to have diabetes mellitus type 2, chronic renal insufficiency, coronary artery disease, hypertension, hypercholesterolemia, temporal arteritis and inflammatory arthritiss also has a history  of having a hysterectomy and some orthopedic related surgeries. The ulcer on the left lower extremity started off as a blister and then. Got progressively worse. She does not have any fever or chills and has not had any recent surgical intervention for this. Her last hemoglobin A1c was 10.1 in September 2015. She has been recently put on doxycycline by her PCP. She is now also allergic to doxycycline and was this was changed over to Keflex. 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 She does see a cardiologist and also takes a diuretic. 02/07/2015 her arterial and venous duplex studies to be done have dates been given as the first and third week of June. This is at [facility]. We are going to try and get early appointments at [facility]. other than that nothing has changed in her management. 02/14/2015 -- we have been able to get her an appointment in [facility] on May 20 which is much earlier than her previous ones at [facility]. She continues with her prednisone and her sugars are in the range of 150-200. 02/21/2015 We were able to get a vascular lab workup for her today and she is going to be there at 2:00 this afternoon. the swelling of her leg has gone down significantly but she still has some tenderness over the wounds. 02/28/2015 - She has had one of two vascular workups done, and this coming Tuesday has another, at [place] [facility]. She continues to be on steroid medications. She has significant sensitivity in her left lower extremity and has pain suggestive of neuropathic pain and I have asked her to address this with her primary care physician. 03/07/2015 -- The patient saw Dr. 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 Official report  is awaited. Addendum: Official reports are now available and on 03/04/2015. She was seen and lower extremity venous duplex exam was done. There was reflux present within the left greater saphenous vein below the knee and also the left small saphenous vein. Arterial duplex showed normal triphasic waveforms throughout the left lower extremity without any significant stenosis. Her ABIs were noncompressible bilaterally but a waveforms were normal and a digital pressures were normal bilaterally consistent with no significant arterial insufficiency. He has recommended endovenous ablation of both the left small saphenous and the left great saphenous vein. This would still be scheduled later. SITLALI, KOERNER ([id number]) 03/14/2015 -- she has heard back from the vascular office and has surgery scheduled for sometime in July. Her rheumatologist has decreased her prednisone dosage but she still on it. She has also had cataract surgery in her right eye recently this week. 03/20/2015 - No new complaints today. Pain improved. No fever or chills. Tolerating 2 layer compression. Electronic Signature(s) Signed: 03/20/2015 4:25:37 PM By: Loletha Grayer MD Entered By: Loletha Grayer on 03/20/2015 12:57:44 Sadowski, Dennie DMarland Kitchen ([id number]) -------------------------------------------------------------------------------- Physical Exam Details Patient Name: [name], [name] Date of Service: 03/20/2015 10:15 AM Medical Record Patient Account Number: [id number] Number: Treating RN: January 01, 1946 (68 y.o. Other Clinician: Date of Birth/Sex: Female) Treating BURNS III, Primary Care Physician/Extender: Lauralyn Primes, [place] Physician: Referring Physician: Candiss Norse, [name] Weeks in Treatment: 6 Constitutional . Pulse regular. Respirations normal and unlabored. Afebrile. Marland Kitchen Respiratory WNL. No retractions.. Cardiovascular Pedal Pulses WNL. Integumentary (Hair, Skin) .Marland Kitchen Neurological Sensation normal to  touch, pin,and vibration. Psychiatric Judgement and insight Intact.. Oriented times 3.. No evidence of depression, anxiety, or agitation.. Notes Left lower extremity ulcerations covered with dry adherent eschar. No drainage. No evidence for infection. Mildly tender. 1+ pitting edema. Palpable DP. Electronic Signature(s) Signed: 03/20/2015 4:25:37 PM By: Loletha Grayer MD Entered By: Loletha Grayer on 03/20/2015 12:58:29 Greenfeld, LYNISE PORR ([id number]) -------------------------------------------------------------------------------- Physician Orders Details Patient Name: MAGABY, RUMBERGER 03/20/2015 10:15 Date of Service: AM Medical Record [id number] Number: Patient Account Number: [id number] 07-Oct-1946 (69 y.o. [name], RN, [name], Date of Birth/Sex: Treating RN: Female) Administrator, sports Primary Care Physician: Glendon Axe Other Clinician: Referring Physician: Glendon Axe Treating Britto, Errol Physician/Extender: Weeks in Treatment: 6 Verbal / Phone Orders: Yes Clinician: [name], RN, [name], Rita Read Back and Verified: Yes Diagnosis Coding Wound Cleansing Wound #1 Left,Proximal Lower Leg o Cleanse wound with mild soap and water Wound #2 Left,Distal Lower Leg o Cleanse wound with mild soap and water Wound #4 Left,Posterior Lower Leg o Cleanse wound with mild soap and water Anesthetic Wound #1 Left,Proximal Lower Leg o Topical Lidocaine 4% cream applied to wound bed prior to debridement Wound #2 Left,Distal Lower Leg o Topical Lidocaine 4% cream applied to wound bed prior to debridement Wound #4 Left,Posterior Lower Leg o Topical Lidocaine 4% cream applied to wound bed prior to debridement Primary Wound Dressing Wound #1 Left,Proximal Lower Leg o Acticoat 7 Wound #2 Left,Distal Lower Leg o Acticoat 7 Wound #4 Left,Posterior Lower Leg o Acticoat 7 Secondary Dressing Wound #1 Left,Proximal Lower Leg o ABD pad Wound #2 Left,Distal Lower Leg Wimmer, Burnadette D.  ([id number]) o ABD pad Wound #4 Left,Posterior Lower Leg o ABD pad Dressing Change Frequency Wound #1 Left,Proximal Lower Leg o Change dressing every week Wound #2 Left,Distal Lower Leg o Change dressing every week Wound #4 Left,Posterior Lower Leg o Change  dressing every week Follow-up Appointments Wound #1 Left,Proximal Lower Leg o Return Appointment in 1 week. Wound #2 Left,Distal Lower Leg o Return Appointment in 1 week. Wound #4 Left,Posterior Lower Leg o Return Appointment in 1 week. Edema Control Wound #1 Left,Proximal Lower Leg o 2 Layer Compression System - Left Lower Extremity Wound #2 Left,Distal Lower Leg o 2 Layer Compression System - Left Lower Extremity Wound #4 Left,Posterior Lower Leg o 2 Layer Compression System - Left Lower Extremity Electronic Signature(s) Signed: 03/20/2015 11:43:39 AM By: Regan Lemming [name], RN Signed: 03/25/2015 7:58:07 AM By: [name], [name] Entered By: Regan Lemming on 03/20/2015 11:10:51 Taliaferro, Ilhan D. ([id number]) -------------------------------------------------------------------------------- Problem List Details Patient Name: LAQUETTA, RACEY D. Date of Service: 03/20/2015 10:15 AM Medical Record Patient Account Number: [id number] Number: Treating RN: June 23, 1946 (69 y.o. 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 ([id number]) -------------------------------------------------------------------------------- Progress Note Details Patient Name: [name] Date of Service: 03/20/2015 10:15 AM Medical Record Patient Account Number: [id number] Number: Treating RN: 08/20/1946 (69 y.o. Other Clinician: Date of Birth/Sex: Female) Treating BURNS III, Primary Care Physician/Extender: Lauralyn Primes, [place] Physician: Referring Physician: Glendon Axe Weeks in Treatment: 6 Subjective Chief Complaint Information obtained from Patient Patient presents to the wound care center for a consult due non healing wound 69 year old patient comes with a history of having a ulcer on the left lower extremity for the past 4 weeks. she says she's had swelling of both lower extremities for about a year after she started having prednisone. 02/07/2015 -- her vascular appointments obtained were in the first and third week of June. she is able to go to [place] and we will try and get her some earlier appointments. Other than that nothing else has changed in her management. History of Present Illness (HPI) 69 year old patient who is known to have diabetes mellitus type 2, chronic renal insufficiency, coronary artery disease, hypertension, hypercholesterolemia, temporal arteritis and inflammatory arthritiss also has a history of having a hysterectomy and some orthopedic related surgeries. The ulcer on the left lower extremity started off as a blister and then. Got progressively worse. She does not have any fever or chills and has not had any recent surgical intervention for this. Her last hemoglobin A1c was  10.1 in September 2015. She has been recently put on doxycycline by her PCP. She is now also allergic to doxycycline and was this was changed over to Keflex. 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 She does see a cardiologist and also takes a diuretic. 02/07/2015 her arterial and venous duplex studies to be done have dates been given as the first and third week of June.  This is at [facility]. We are going to try and get early appointments at [facility]. other than that nothing has changed in her management. 02/14/2015 -- we have been able to get her an appointment in [facility] on May 20 which is much earlier than her previous ones at [place]. She continues with her prednisone and her sugars are in the range of 150-200. 02/21/2015 We were able to get a vascular lab workup for her today and she is going to be there at 2:00 this afternoon. the swelling of her leg has gone down significantly but she still has some tenderness over the wounds. 02/28/2015 - She has had one of two vascular workups done, and this coming Tuesday has another, at [place] [facility]. She continues to be on steroid medications. She has significant sensitivity in her left lower extremity and has pain suggestive of neuropathic pain and I have asked her to address this with her primary care physician. 03/07/2015 -- The patient saw Dr. Lucky Cowboy for a consultation and he has had her arteries are okay but she has Heiser, Wilna D. 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 Official report is awaited. Addendum: Official reports are now available and on 03/04/2015. She was seen and lower extremity venous duplex exam was done. There was reflux present within the left greater saphenous vein below the knee and also the left small saphenous vein.  Arterial duplex showed normal triphasic waveforms throughout the left lower extremity without any significant stenosis. Her ABIs were noncompressible bilaterally but a waveforms were normal and a digital pressures were normal bilaterally consistent with no significant arterial insufficiency. He has recommended endovenous ablation of both the left small saphenous and the left great saphenous vein. This would still be scheduled later. 03/14/2015 -- she has heard back from the vascular office and has surgery scheduled for sometime in July. Her rheumatologist has decreased her prednisone dosage but she still on it. She has also had cataract surgery in her right eye recently this week. 03/20/2015 - No new complaints today. Pain improved. No fever or chills. Tolerating 2 layer compression. Objective Constitutional Pulse regular. Respirations normal and unlabored. Afebrile. Vitals Time Taken: 10:52 AM, Height: 65 in, Weight: 248 lbs, BMI: 41.3, Temperature: 97.7 F, Pulse: 65 bpm, Respiratory Rate: 16 breaths/min, Blood Pressure: 120/50 mmHg. Respiratory WNL. No retractions.. Cardiovascular Pedal Pulses WNL. Neurological Sensation normal to touch, pin,and vibration. Psychiatric Judgement and insight Intact.. Oriented times 3.. No evidence of depression, anxiety, or agitation.. General Notes: Left lower extremity ulcerations covered with dry adherent eschar. No drainage. No evidence for infection. Mildly tender. No debridement required. 1+ pitting edema. Palpable DP. Integumentary (Hair, Skin) Wound #1 status is Open. Original cause of wound was Blister. The wound is located on the Left,Proximal Beaird, Lauren D. ([id number]) Lower Leg. The wound measures 0.7cm length x 0.4cm width x 0.1cm depth; 0.22cm^2 area and 0.022cm^3 volume. The wound is limited to skin breakdown. There is a none present amount of drainage noted. The wound margin is flat and intact. There is no granulation within the wound  bed. There is a large (67-100%) amount of necrotic tissue within the wound bed including Eschar. The periwound skin appearance exhibited: Dry/Scaly. The periwound skin appearance did not exhibit: Callus, Crepitus, Excoriation, Fluctuance, Friable, Induration, Localized Edema, Rash, Scarring, Maceration, Moist, Atrophie Blanche, Cyanosis, Ecchymosis, Hemosiderin Staining, Mottled, Pallor, Rubor, Erythema. Periwound temperature was noted as No Abnormality. The  periwound has tenderness on palpation. Wound #2 status is Open. Original cause of wound was Blister. The wound is located on the Left,Distal Lower Leg. The wound measures 5.6cm length x 2cm width x 0.1cm depth; 8.796cm^2 area and 0.88cm^3 volume. The wound is limited to skin breakdown. There is a none present amount of drainage noted. The wound margin is flat and intact. There is no granulation within the wound bed. There is a large (67-100%) amount of necrotic tissue within the wound bed including Eschar. The periwound skin appearance exhibited: Dry/Scaly. The periwound skin appearance did not exhibit: Callus, Crepitus, Excoriation, Fluctuance, Friable, Induration, Localized Edema, Rash, Scarring, Maceration, Moist, Atrophie Blanche, Cyanosis, Ecchymosis, Hemosiderin Staining, Mottled, Pallor, Rubor, Erythema. Periwound temperature was noted as No Abnormality. The periwound has tenderness on palpation. Wound #4 status is Open. Original cause of wound was Blister. The wound is located on the Left,Posterior Lower Leg. The wound measures 0.5cm length x 0.3cm width x 0.1cm depth; 0.118cm^2 area and 0.012cm^3 volume. The wound is limited to skin breakdown. There is a none present amount of drainage noted. The wound margin is flat and intact. There is no granulation within the wound bed. There is a large (67-100%) amount of necrotic tissue within the wound bed including Eschar. The periwound skin appearance exhibited: Dry/Scaly, Hemosiderin  Staining. The periwound skin appearance did not exhibit: Callus, Crepitus, Excoriation, Fluctuance, Friable, Induration, Localized Edema, Rash, Scarring, Maceration, Moist, Atrophie Blanche, Cyanosis, Ecchymosis, Mottled, Pallor, Rubor, Erythema. Periwound temperature was noted as No Abnormality. The periwound has tenderness on palpation. 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 TAIYANA, KISSLER ([id number]) Plan Wound Cleansing: Wound #1 Left,Proximal Lower Leg: Cleanse wound with mild soap and water Wound #2 Left,Distal Lower Leg: Cleanse wound with mild soap and water Wound #4 Left,Posterior Lower Leg: Cleanse wound with mild soap and water Anesthetic: Wound #1 Left,Proximal Lower Leg: Topical Lidocaine 4% cream applied to wound bed prior to debridement Wound #2 Left,Distal Lower Leg: Topical Lidocaine 4% cream applied to wound bed prior to debridement Wound #4 Left,Posterior Lower Leg: Topical Lidocaine 4% cream applied to wound bed prior to debridement Primary Wound Dressing: Wound #1 Left,Proximal Lower Leg: Acticoat 7 Wound #2 Left,Distal Lower Leg: Acticoat 7 Wound #4 Left,Posterior Lower Leg: Acticoat 7 Secondary Dressing: Wound #1 Left,Proximal Lower Leg: ABD pad Wound #2 Left,Distal Lower Leg: ABD pad Wound #4 Left,Posterior Lower Leg: ABD pad Dressing Change Frequency: Wound #1 Left,Proximal Lower Leg: Change dressing every week Wound #2 Left,Distal Lower Leg: Change dressing every week Wound #4  Left,Posterior Lower Leg: Change dressing every week Follow-up Appointments: Wound #1 Left,Proximal Lower Leg: Return Appointment in 1 week. Wound #2 Left,Distal Lower Leg: Return Appointment in 1 week. Wound #4 Left,Posterior Lower Leg: Return Appointment in 1 week. Edema Control: Wound #1 Left,Proximal Lower Leg: 2 Layer Compression System - Left Lower Extremity Wound #2 Left,Distal Lower Leg: 2 Layer Compression System - Left Lower Extremity Schrier, Sheli D. ([id number]) Wound #4 Left,Posterior Lower Leg: 2 Layer Compression System - Left Lower Extremity Continuous 2 layer compression. Consider punch biopsy to better  define etiology. Electronic Signature(s) Signed: 03/20/2015 4:25:37 PM By: Loletha Grayer MD Entered By: Loletha Grayer on 03/20/2015 13:00:20 Mcelreath, Aarohi D. ([id number]) -------------------------------------------------------------------------------- SuperBill Details Patient Name: [name] Date of Service: 03/20/2015 Medical Record Patient Account Number: [id number] Number: [name], RN, [name], Treating RN: [birth year] (69 y.o. 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

## 2015-03-27 ENCOUNTER — Encounter: Payer: PPO | Attending: Surgery | Admitting: Surgery

## 2015-03-27 DIAGNOSIS — L97322 Non-pressure chronic ulcer of left ankle with fat layer exposed: Secondary | ICD-10-CM | POA: Insufficient documentation

## 2015-03-27 DIAGNOSIS — I83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: Secondary | ICD-10-CM | POA: Diagnosis not present

## 2015-03-27 DIAGNOSIS — I83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: Secondary | ICD-10-CM | POA: Diagnosis not present

## 2015-03-27 DIAGNOSIS — E11622 Type 2 diabetes mellitus with other skin ulcer: Secondary | ICD-10-CM | POA: Diagnosis not present

## 2015-03-27 DIAGNOSIS — I890 Lymphedema, not elsewhere classified: Secondary | ICD-10-CM | POA: Insufficient documentation

## 2015-03-28 NOTE — Progress Notes (Signed)
Kristin, [name] ([id number]) Visit Report for 03/27/2015 Arrival Information Details Patient Name: [name], [name] Date of Service: 03/27/2015 11:30 AM Medical Record Number: [id number] Patient Account Number: [id number] Date of Birth/Sex: 01/14/1946 (69 y.o. Female) Treating RN: [name] Primary Care Physician: Glendon Axe Other Clinician: Referring Physician: Glendon Axe Treating Physician/Extender: Frann Rider in Treatment: 7 Visit Information History Since Last Visit Added or deleted any medications: No Patient Arrived: Ambulatory Any new allergies or adverse reactions: No Arrival Time: 11:31 Had a fall or experienced change in No Accompanied By: self activities of daily living that may affect Transfer Assistance: None risk of falls: Patient Identification Verified: Yes Signs or symptoms of abuse/neglect since last No Secondary Verification Process Yes visito Completed: Hospitalized since last visit: No Patient Has Alerts: Yes Has Dressing in Place as Prescribed: Yes Patient Alerts: Patient on Blood Has Compression in Place as Prescribed: Yes Thinner Pain Present Now: No Electronic Signature(s) Signed: 03/28/2015 1:28:45 PM By: [name], RN, [name], [name], [name] Entered By: [name], RN, [name], Kim on 03/27/2015 11:32:05 Check, Tia Masker ([id number]) -------------------------------------------------------------------------------- Encounter Discharge Information Details Patient Name: Kristin, [name] Date of Service: 03/27/2015 11:30 AM Medical Record Number: [id number] Patient Account Number: [id number] Date of Birth/Sex: 1946-02-05 (69 y.o. Female) Treating RN: Primary Care Physician: Glendon Axe Other Clinician: Referring Physician: Glendon Axe Treating Physician/Extender: Frann Rider in Treatment: 7 Encounter Discharge Information Items Discharge Pain Level: 0 Discharge Condition: Stable Ambulatory Status: Ambulatory Discharge Destination:  Home Transportation: Private Auto Accompanied By: self Schedule Follow-up Appointment: Yes Medication Reconciliation completed and provided to Patient/Care Yes Jeven Topper: Provided on Clinical Summary of Care: 03/27/2015 Form Type Recipient Paper Patient LM Electronic Signature(s) Signed: 03/28/2015 1:28:45 PM By: [name] RN, [name], [name], [name] Previous Signature: 03/27/2015 12:12:02 PM Version By: Ruthine Dose Entered By: [name] RN, [name], Kim on 03/27/2015 12:21:00 Ospina, Saree DMarland Kitchen ([id number]) -------------------------------------------------------------------------------- Lower Extremity Assessment Details Patient Name: Kristin, [name] Date of Service: 03/27/2015 11:30 AM Medical Record Number: [id number] Patient Account Number: [id number] Date of Birth/Sex: 02/16/1946 (69 y.o. Female) Treating RN: [name] Primary Care Physician: Glendon Axe Other Clinician: Referring Physician: Glendon Axe Treating Physician/Extender: Frann Rider in Treatment: 7 Edema Assessment Assessed: [Left: No] [Right: No] E[Left: dema] [Right: :] Calf Left: Right: Point of Measurement: 33 cm From Medial Instep 34.6 cm cm Ankle Left: Right: Point of Measurement: 12 cm From Medial Instep 21.6 cm cm Vascular Assessment Pulses: Posterior Tibial Dorsalis Pedis Palpable: [Left:Yes] Extremity colors, hair growth, and conditions: Extremity Color: [Left:Normal] Hair Growth on Extremity: [Left:Yes] Temperature of Extremity: [Left:Warm] Capillary Refill: [Left:< 3 seconds] Toe Nail Assessment Left: Right: Thick: No Discolored: No Deformed: No Improper Length and Hygiene: No Electronic Signature(s) Signed: 03/28/2015 1:28:45 PM By: [name], RN, [name], [name], [name] Entered By: [name], RN, [name], Kim on 03/27/2015 11:37:15 Macht, Keerthana D. ([id number]) -------------------------------------------------------------------------------- Multi Wound Chart Details Patient Name: Kristin Stare D. Date of Service:  03/27/2015 11:30 AM Medical Record Number: [id number] Patient Account Number: [id number] Date of Birth/Sex: [birth year] (69 y.o. Female) Treating RN: [name] Primary Care Physician: Glendon Axe Other Clinician: Referring Physician: Glendon Axe Treating Physician/Extender: Frann Rider in Treatment: 7 Vital Signs Height(in): 65 Pulse(bpm): 70 Weight(lbs): 248 Blood Pressure 120/53 (mmHg): Body Mass Index(BMI): 41 Temperature(F): 97.8 Respiratory Rate 16 (breaths/min): Photos: [1:No Photos] [2:No Photos] [4:No Photos] Wound Location: [1:Left Lower Leg - Proximal] [2:Left Lower Leg - Distal] [4:Left Lower Leg - Posterior] Wounding Event: [1:Blister] [2:Blister] [4:Blister] Primary Etiology: [1:Venous Leg  Ulcer] [2:Venous Leg Ulcer] [4:Venous Leg Ulcer] Comorbid History: [1:Cataracts, Asthma, Coronary Artery Disease, Hypertension, Type II Diabetes, Osteoarthritis, Neuropathy] [2:Cataracts, Asthma, Coronary Artery Disease, Hypertension, Type II Diabetes, Osteoarthritis, Neuropathy] [4:Cataracts, Asthma,  Coronary Artery Disease, Hypertension, Type II Diabetes, Osteoarthritis, Neuropathy] Date Acquired: [1:12/30/2014] [2:12/30/2014] [4:12/30/2014] Weeks of Treatment: [1:7] [2:7] [4:7] Wound Status: [1:Open] [2:Open] [4:Open] Clustered Wound: [1:No] [2:Yes] [4:No] Measurements L x W x D 0.7x0.4x0.1 [2:4x1.8x0.1] [4:0.6x0.4x0.1] (cm) Area (cm) : [1:0.22] [2:5.655] [4:0.188] Volume (cm) : [1:0.022] [2:0.565] [4:0.019] % Reduction in Area: [1:63.10%] [2:-699.90%] [4:43.00%] % Reduction in Volume: 63.30% [2:-695.80%] [4:42.40%] Classification: [1:Unclassifiable] [2:Full Thickness Without Exposed Support Structures] [4:Unclassifiable] HBO Classification: [1:Grade 0] [2:Grade 1] [4:Grade 0] Exudate Amount: [1:None Present] [2:None Present] [4:None Present] Wound Margin: [1:Flat and Intact] [2:Flat and Intact] [4:Flat and Intact] Granulation Amount: [1:None Present (0%)]  [2:None Present (0%)] [4:None Present (0%)] Necrotic Amount: [1:Large (67-100%)] [2:Large (67-100%)] [4:Large (67-100%)] Necrotic Tissue: [1:Eschar] [2:Eschar] [4:Eschar] Exposed Structures: [1:Fascia: No Fat: No] [2:Fascia: No Fat: No] [4:Fascia: No Fat: No] Tendon: No Tendon: No Tendon: No Muscle: No Muscle: No Muscle: No Joint: No Joint: No Joint: No Bone: No Bone: No Bone: No Limited to Skin Limited to Skin Limited to Skin Breakdown Breakdown Breakdown Epithelialization: Small (1-33%) None None Periwound Skin Texture: Edema: No Edema: No Edema: No Excoriation: No Excoriation: No Excoriation: No Induration: No Induration: No Induration: No Callus: No Callus: No Callus: No Crepitus: No Crepitus: No Crepitus: No Fluctuance: No Fluctuance: No Fluctuance: No Friable: No Friable: No Friable: No Rash: No Rash: No Rash: No Scarring: No Scarring: No Scarring: No Periwound Skin Dry/Scaly: Yes Dry/Scaly: Yes Dry/Scaly: Yes Moisture: Maceration: No Maceration: No Maceration: No Moist: No Moist: No Moist: No Periwound Skin Color: Atrophie Blanche: No Atrophie Blanche: No Hemosiderin Staining: Yes Cyanosis: No Cyanosis: No Atrophie Blanche: No Ecchymosis: No Ecchymosis: No Cyanosis: No Erythema: No Erythema: No Ecchymosis: No Hemosiderin Staining: No Hemosiderin Staining: No Erythema: No Mottled: No Mottled: No Mottled: No Pallor: No Pallor: No Pallor: No Rubor: No Rubor: No Rubor: No Temperature: No Abnormality No Abnormality No Abnormality Tenderness on Yes Yes Yes Palpation: Wound Preparation: Ulcer Cleansing: Other: Ulcer Cleansing: Other: Ulcer Cleansing: Other: soap and water soap and water soap and water Topical Anesthetic Topical Anesthetic Topical Anesthetic Applied: Other: lidocaine Applied: Other: lidocaine Applied: Other: lidocaine 4% 4% 4% Treatment Notes Electronic Signature(s) Signed: 03/27/2015 5:10:45 PM By: [name] Entered By: [name] on 03/27/2015 11:55:15 Waiters, Joeanne D. ([id number]) -------------------------------------------------------------------------------- Multi-Disciplinary Care Plan Details Patient Name: ORTHA, METTS D. Date of Service: 03/27/2015 11:30 AM Medical Record Number: [id number] Patient Account Number: [id number] Date of Birth/Sex: 13-Nov-1945 (69 y.o. Female) Treating RN: [name] Primary Care Physician: Glendon Axe Other Clinician: Referring Physician: Glendon Axe Treating Physician/Extender: Frann Rider in Treatment: 7 Active Inactive Orientation to the Wound Care Program Nursing Diagnoses: Knowledge deficit related to the wound healing center program Goals: Patient/caregiver will verbalize understanding of the [facility] Date Initiated: 01/31/2015 Goal Status: Active Interventions: Provide education on orientation to the wound center Notes: Venous Leg Ulcer Nursing Diagnoses: Potential for venous Insuffiency (use before diagnosis confirmed) Goals: Non-invasive venous studies are completed as ordered Date Initiated: 01/31/2015 Goal Status: Active Patient/caregiver will verbalize understanding of disease process and disease management Date Initiated: 01/31/2015 Goal Status: Active Interventions: Assess peripheral edema status every visit. Notes: Wound/Skin Impairment Nursing Diagnoses: Impaired tissue integrity Knowledge deficit related to smoking impact on wound healing Prospero, Donis D. ([id number]) Goals: Patient/caregiver will verbalize  understanding of skin care regimen Date Initiated: 01/31/2015 Goal Status: Active Ulcer/skin breakdown will heal within 14 weeks Date Initiated: 01/31/2015 Goal Status: Active Interventions: Assess ulceration(s) every visit Notes: Electronic Signature(s) Signed: 03/27/2015 5:10:45 PM By: [name] Entered By: [name] on 03/27/2015 11:55:06 Moat, Noriko D.  ([id number]) -------------------------------------------------------------------------------- Pain Assessment Details Patient Name: Kristin Stare D. Date of Service: 03/27/2015 11:30 AM Medical Record Number: [id number] Patient Account Number: [id number] Date of Birth/Sex: 1946/02/27 (69 y.o. Female) Treating RN: [name] Primary Care Physician: Glendon Axe Other Clinician: Referring Physician: Glendon Axe Treating Physician/Extender: Frann Rider in Treatment: 7 Active Problems Location of Pain Severity and Description of Pain Patient Has Paino No Site Locations Pain Management and Medication Current Pain Management: Electronic Signature(s) Signed: 03/28/2015 1:28:45 PM By: [name], RN, [name], [name], [name] Entered By: [name], RN, [name], Kim on 03/27/2015 11:32:12 Kruck, Tia Masker ([id number]) -------------------------------------------------------------------------------- Patient/Caregiver Education Details Patient Name: Kristin Stare D. Date of Service: 03/27/2015 11:30 AM Medical Record Number: [id number] Patient Account Number: [id number] Date of Birth/Gender: 07/12/1946 (69 y.o. Female) Treating RN: [name] Primary Care Physician: Glendon Axe Other Clinician: Referring Physician: Glendon Axe Treating Physician/Extender: Frann Rider in Treatment: 7 Education Assessment Education Provided To: Patient Education Topics Provided Wound/Skin Impairment: Handouts: Other: importance of debridement Methods: Explain/Verbal Responses: State content correctly Electronic Signature(s) Signed: 03/27/2015 5:10:45 PM By: [name] Entered By: [name] on 03/27/2015 12:05:51 Hosterman, Camri D. ([id number]) -------------------------------------------------------------------------------- Wound Assessment Details Patient Name: Serrao, Khia D. Date of Service: 03/27/2015 11:30 AM Medical Record Number: [id number] Patient Account Number: [id number] Date of  Birth/Sex: Jun 30, 1946 (69 y.o. Female) Treating RN: [name] Primary Care Physician: Glendon Axe Other Clinician: Referring Physician: Glendon Axe Treating Physician/Extender: Frann Rider in Treatment: 7 Wound Status Wound Number: 1 Primary Venous Leg Ulcer Etiology: Wound Location: Left Lower Leg - Proximal Wound Open Wounding Event: Blister Status: Date Acquired: 12/30/2014 Comorbid Cataracts, Asthma, Coronary Artery Weeks Of Treatment: 7 History: Disease, Hypertension, Type II Clustered Wound: No Diabetes, Osteoarthritis, Neuropathy Photos Photo Uploaded By: [name], RN, [name], Kim on 03/27/2015 12:23:42 Wound Measurements Length: (cm) 0.7 Width: (cm) 0.4 Depth: (cm) 0.1 Area: (cm) 0.22 Volume: (cm) 0.022 % Reduction in Area: 63.1% % Reduction in Volume: 63.3% Epithelialization: Small (1-33%) Wound Description Classification: Unclassifiable Diabetic Severity Earleen Newport): Grade 0 Wound Margin: Flat and Intact Exudate Amount: None Present Foul Odor After Cleansing: No Wound Bed Granulation Amount: None Present (0%) Exposed Structure Necrotic Amount: Large (67-100%) Fascia Exposed: No Necrotic Quality: Eschar Fat Layer Exposed: No Tendon Exposed: No Muscle Exposed: No Krakow, Milderd D. ([id number]) Joint Exposed: No Bone Exposed: No Limited to Skin Breakdown Periwound Skin Texture Texture Color No Abnormalities Noted: No No Abnormalities Noted: No Callus: No Atrophie Blanche: No Crepitus: No Cyanosis: No Excoriation: No Ecchymosis: No Fluctuance: No Erythema: No Friable: No Hemosiderin Staining: No Induration: No Mottled: No Localized Edema: No Pallor: No Rash: No Rubor: No Scarring: No Temperature / Pain Moisture Temperature: No Abnormality No Abnormalities Noted: No Tenderness on Palpation: Yes Dry / Scaly: Yes Maceration: No Moist: No Wound Preparation Ulcer Cleansing: Other: soap and water, Topical Anesthetic Applied: Other:  lidocaine 4%, Treatment Notes Wound #1 (Left, Proximal Lower Leg) 1. Cleansed with: Cleanse wound with antibacterial soap and water 2. Anesthetic Topical Lidocaine 4% cream to wound bed prior to debridement 4. Dressing Applied: Aquacel Ag 5. Secondary Dressing Applied ABD Pad 7. Secured with Tape 2 Layer Compression System - Left Lower Extremity Electronic Signature(s) Signed: 03/28/2015  1:28:45 PM By: [name], RN, [name], [name], [name] Entered By: [name], RN, [name], Kim on 03/27/2015 11:47:41 Badal, Tia Masker ([id number]) -------------------------------------------------------------------------------- Wound Assessment Details Patient Name: MAHITHA, HICKLING D. Date of Service: 03/27/2015 11:30 AM Medical Record Number: [id number] Patient Account Number: [id number] Date of Birth/Sex: July 29, 1946 (69 y.o. Female) Treating RN: [name] Primary Care Physician: Glendon Axe Other Clinician: Referring Physician: Glendon Axe Treating Physician/Extender: Frann Rider in Treatment: 7 Wound Status Wound Number: 2 Primary Venous Leg Ulcer Etiology: Wound Location: Left Lower Leg - Distal Wound Open Wounding Event: Blister Status: Date Acquired: 12/30/2014 Comorbid Cataracts, Asthma, Coronary Artery Weeks Of Treatment: 7 History: Disease, Hypertension, Type II Clustered Wound: Yes Diabetes, Osteoarthritis, Neuropathy Photos Photo Uploaded By: [name], RN, [name], Kim on 03/27/2015 12:23:43 Wound Measurements Length: (cm) 4 % Reduction in Width: (cm) 1.8 % Reduction in Depth: (cm) 0.1 Epithelializati Area: (cm) 5.655 Volume: (cm) 0.565 Area: -699.9% Volume: -695.8% on: None Wound Description Full Thickness Without Classification: Exposed Support Structures Diabetic Severity Grade 1 (Wagner): KLOIE, WHITING ([id number]) Foul Odor After Cleansing: No Wound Margin: Flat and Intact Exudate Amount: None Present Wound Bed Granulation Amount: None Present (0%) Exposed  Structure Necrotic Amount: Large (67-100%) Fascia Exposed: No Necrotic Quality: Eschar Fat Layer Exposed: No Tendon Exposed: No Muscle Exposed: No Joint Exposed: No Bone Exposed: No Limited to Skin Breakdown Periwound Skin Texture Texture Color No Abnormalities Noted: No No Abnormalities Noted: No Callus: No Atrophie Blanche: No Crepitus: No Cyanosis: No Excoriation: No Ecchymosis: No Fluctuance: No Erythema: No Friable: No Hemosiderin Staining: No Induration: No Mottled: No Localized Edema: No Pallor: No Rash: No Rubor: No Scarring: No Temperature / Pain Moisture Temperature: No Abnormality No Abnormalities Noted: No Tenderness on Palpation: Yes Dry / Scaly: Yes Maceration: No Moist: No Wound Preparation Ulcer Cleansing: Other: soap and water, Topical Anesthetic Applied: Other: lidocaine 4%, Treatment Notes Wound #2 (Left, Distal Lower Leg) 1. Cleansed with: Cleanse wound with antibacterial soap and water 2. Anesthetic Topical Lidocaine 4% cream to wound bed prior to debridement 4. Dressing Applied: Aquacel Ag 5. Secondary Dressing Applied ABD Pad 7. Secured with BRIARROSE, SHOR ([id number]) Tape 2 Layer Compression System - Left Lower Extremity Electronic Signature(s) Signed: 03/28/2015 1:28:45 PM By: [name], RN, [name], [name], [name] Entered By: [name], RN, [name], Kim on 03/27/2015 11:47:57 Koenig, Tia Masker ([id number]) -------------------------------------------------------------------------------- Wound Assessment Details Patient Name: LINNELL, SWORDS D. Date of Service: 03/27/2015 11:30 AM Medical Record Number: [id number] Patient Account Number: [id number] Date of Birth/Sex: [birth year] (69 y.o. Female) Treating RN: [name] Primary Care Physician: Glendon Axe Other Clinician: Referring Physician: Glendon Axe Treating Physician/Extender: Frann Rider in Treatment: 7 Wound Status Wound Number: 4 Primary Venous Leg Ulcer Etiology: Wound  Location: Left, Posterior Lower Leg Wound Healed - Epithelialized Wounding Event: Blister Status: Date Acquired: 12/30/2014 Comorbid Cataracts, Asthma, Coronary Artery Weeks Of Treatment: 7 History: Disease, Hypertension, Type II Clustered Wound: No Diabetes, Osteoarthritis, Neuropathy Photos Photo Uploaded By: [name], RN, [name], Kim on 03/27/2015 12:24:24 Wound Measurements Length: (cm) 0 % Reduction Width: (cm) 0 % Reduction Depth: (cm) 0 Epitheliali Area: (cm) 0 Volume: (cm) 0 in Area: 100% in Volume: 100% zation: None Wound Description Classification: Unclassifiable Foul Odor A Diabetic Severity (Wagner): Grade 0 Wound Margin: Flat and Intact Exudate Amount: None Present fter Cleansing: No Wound Bed Granulation Amount: None Present (0%) Exposed Structure Necrotic Amount: Large (67-100%) Fascia Exposed: No Necrotic Quality: Eschar Fat Layer Exposed: No Tendon Exposed: No Muscle Exposed: No Delany,  Sharolyn D. ([id number]) Joint Exposed: No Bone Exposed: No Limited to Skin Breakdown Periwound Skin Texture Texture Color No Abnormalities Noted: No No Abnormalities Noted: No Callus: No Atrophie Blanche: No Crepitus: No Cyanosis: No Excoriation: No Ecchymosis: No Fluctuance: No Erythema: No Friable: No Hemosiderin Staining: Yes Induration: No Mottled: No Localized Edema: No Pallor: No Rash: No Rubor: No Scarring: No Temperature / Pain Moisture Temperature: No Abnormality No Abnormalities Noted: No Tenderness on Palpation: Yes Dry / Scaly: Yes Maceration: No Moist: No Wound Preparation Ulcer Cleansing: Other: soap and water, Topical Anesthetic Applied: Other: lidocaine 4%, Electronic Signature(s) Signed: 03/27/2015 5:10:45 PM By: [name] Entered By: [name] on 03/27/2015 11:57:24 Gullatt, Abrielle D. ([id number]) -------------------------------------------------------------------------------- Vitals Details Patient Name: Kristin Stare D. Date  of Service: 03/27/2015 11:30 AM Medical Record Number: [id number] Patient Account Number: [id number] Date of Birth/Sex: 06/02/1946 (69 y.o. Female) Treating RN: [name] Primary Care Physician: Glendon Axe Other Clinician: Referring Physician: Glendon Axe Treating Physician/Extender: Frann Rider in Treatment: 7 Vital Signs Time Taken: 11:32 Temperature (F): 97.8 Height (in): 65 Pulse (bpm): 70 Weight (lbs): 248 Respiratory Rate (breaths/min): 16 Body Mass Index (BMI): 41.3 Blood Pressure (mmHg): 120/53 Reference Range: 80 - 120 mg / dl Electronic Signature(s) Signed: 03/28/2015 1:28:45 PM By: [name], RN, [name], [name], [name] Entered By: [name], RN, [name], Kim on 03/27/2015 11:35:43

## 2015-03-28 NOTE — Progress Notes (Signed)
[name], [name] ([id number]) Visit Report for 03/27/2015 Chief Complaint Document Details Patient Name: [name], [name] 03/27/2015 11:30 Date of Service: AM Medical Record [id number] Number: Patient Account Number: [id number] [birth year] (69 y.o. Treating RN: Date of Birth/Sex: Female) Other Clinician: Primary Care Physician: [facility], [name] Treating [name] Referring Physician: Glendon Axe Physician/Extender: Weeks in Treatment: 7 Information Obtained from: Patient Chief Complaint Patient presents to the wound care center for a consult due non healing wound 69 year old patient comes with a history of having a ulcer on the left lower extremity for the past 4 weeks. she says she's had swelling of both lower extremities for about a year after she started having prednisone. 02/07/2015 -- her vascular appointments obtained were in the first and third week of June. she is able to go to [place] and we will try and get her some earlier appointments. Other than that nothing else has changed in her management. Electronic Signature(s) Signed: 03/27/2015 12:32:56 PM By: [name], [name] Entered By: [name] on 03/27/2015 12:23:02 [name], [name] ([id number]) -------------------------------------------------------------------------------- Debridement Details Patient Name: [name], [name] 03/27/2015 11:30 Date of Service: AM Medical Record [id number] Number: Patient Account Number: [id number] [birth year] (69 y.o. Treating RN: Date of Birth/Sex: Female) Other Clinician: Primary Care Physician: [facility], [name] Treating Danyell Awbrey Referring Physician: Glendon Axe Physician/Extender: Weeks in Treatment: 7 Debridement Performed for Wound #2 Left,Distal Lower Leg Assessment: Performed By: Physician Pat Patrick., MD Debridement: Debridement Pre-procedure Yes Verification/Time Out Taken: Start Time: 11:57 Pain Control: Lidocaine 4% Topical Solution Level:  Skin/Subcutaneous Tissue Total Area Debrided (L x 4 (cm) x 1.8 (cm) = 7.2 (cm) W): Instrument: Forceps, Scissors Bleeding: Minimum Hemostasis Achieved: Silver Nitrate End Time: 12:03 Procedural Pain: 5 Post Procedural Pain: 0 Response to Treatment: Procedure was tolerated well Post Debridement Measurements of Total Wound Length: (cm) 4 Width: (cm) 1.8 Depth: (cm) 0.2 Volume: (cm) 1.131 Electronic Signature(s) Signed: 03/27/2015 12:32:56 PM By: [name], [name] Entered By: [name] on 03/27/2015 12:22:51 Anthes, Tia Masker ([id number]) -------------------------------------------------------------------------------- HPI Details Patient Name: [name], [name] 03/27/2015 11:30 Date of Service: AM Medical Record [id number] Number: Patient Account Number: [id number] 02-22-1946 (69 y.o. Treating RN: Date of Birth/Sex: Female) Other Clinician: Primary Care Physician: [facility], [name] Treating Martie Muhlbauer Referring Physician: Glendon Axe Physician/Extender: Weeks in Treatment: 7 History of Present Illness HPI Description: 69 year old patient who is known to have diabetes mellitus type 2, chronic renal insufficiency, coronary artery disease, hypertension, hypercholesterolemia, temporal arteritis and inflammatory arthritiss also has a history of having a hysterectomy and some orthopedic related surgeries. The ulcer on the left lower extremity started off as a blister and then. Got progressively worse. She does not have any fever or chills and has not had any recent surgical intervention for this. Her last hemoglobin A1c was 10.1 in September 2015. She has been recently put on doxycycline by her PCP. She is now also allergic to doxycycline and was this was changed over to Keflex. 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 She does see a cardiologist and also takes a  diuretic. 02/07/2015 her arterial and venous duplex studies to be done have dates been given as the first and third week of June. This is at [facility]. We are going to try and get early appointments at [facility]. other than that nothing has changed in her management. 02/14/2015 -- we have been  able to get her an appointment in [place] on May 20 which is much earlier than her previous ones at [place]. She continues with her prednisone and her sugars are in the range of 150-200. 02/21/2015 We were able to get a vascular lab workup for her today and she is going to be there at 2:00 this afternoon. the swelling of her leg has gone down significantly but she still has some tenderness over the wounds. 02/28/2015 - She has had one of two vascular workups done, and this coming Tuesday has another, at [place] [facility]. She continues to be on steroid medications. She has significant sensitivity in her left lower extremity and has pain suggestive of neuropathic pain and I have asked her to address this with her primary care physician. 03/07/2015 -- The patient saw Dr. Lucky Cowboy for a consultation and he has had her arteries are okay but she has 2 incompetent veins on the left lower extremity and he is going to set her up for surgery. Official report is awaited. Addendum: Official reports are now available and on 03/04/2015. She was seen and lower extremity venous duplex exam was done. There was reflux present within the left greater saphenous vein below the knee and also the left small saphenous vein. Arterial duplex showed normal triphasic waveforms throughout the left lower extremity without any significant stenosis. Her ABIs were noncompressible bilaterally but a waveforms were normal and a digital pressures were normal bilaterally consistent with no significant arterial insufficiency. He has recommended endovenous ablation of both the left small saphenous and the left  great saphenous vein. This would still be scheduled later. 03/14/2015 -- she has heard back from the vascular office and has surgery scheduled for sometime in July. [name], [name] ([id number]) Her rheumatologist has decreased her prednisone dosage but she still on it. She has also had cataract surgery in her right eye recently this week. 03/20/2015 - No new complaints today. Pain improved. No fever or chills. Tolerating 2 layer compression. Electronic Signature(s) Signed: 03/27/2015 12:32:56 PM By: [name], [name] Entered By: [name] on 03/27/2015 12:23:29 BIANEY, TESORO ([id number]) -------------------------------------------------------------------------------- Physical Exam Details Patient Name: [name], [name] 03/27/2015 11:30 Date of Service: AM Medical Record [id number] Number: Patient Account Number: [id number] Jan 10, 1946 (69 y.o. Treating RN: Date of Birth/Sex: Female) Other Clinician: Primary Care Physician: [facility], [name] Treating Bali Lyn Referring Physician: Glendon Axe Physician/Extender: Weeks in Treatment: 7 Constitutional . Pulse regular. Respirations normal and unlabored. Afebrile. . Eyes Nonicteric. Reactive to light. Ears, Nose, Mouth, and Throat Lips, teeth, and gums WNL.Marland Kitchen Moist mucosa without lesions . Neck supple and nontender. No palpable supraclavicular or cervical adenopathy. Normal sized without goiter. Respiratory WNL. No retractions.. Cardiovascular Pedal Pulses WNL. No clubbing, cyanosis or edema. Integumentary (Hair, Skin) some of the wounds on the posterior part of completely healed and the others are looking much better and the edema has gone down completely.Marland Kitchen No crepitus or fluctuance. No peri-wound warmth or erythema. No masses.Marland Kitchen Psychiatric Judgement and insight Intact.. No evidence of depression, anxiety, or agitation.. Electronic Signature(s) Signed: 03/27/2015 12:32:56 PM By: [name], [name] Entered By: [name] on 03/27/2015 12:24:10 [name], [name] ([id number]) -------------------------------------------------------------------------------- Physician Orders Details Patient Name: [name], [name] 03/27/2015 11:30 Date of Service: AM Medical Record [id number] Number: Patient Account Number: [id number] Aug 31, 1946 (69 y.o. Treating RN: [name] Date of Birth/Sex: Female) Other Clinician: Primary Care Physician: [facility], Delana Meyer Treating [name] Referring Physician: Glendon Axe Physician/Extender: Weeks in Treatment:  7 Verbal / Phone Orders: Yes Clinician: Dorthy, Joanna Read Back and Verified: Yes Diagnosis Coding Wound Cleansing Wound #1 Left,Proximal Lower Leg o Cleanse wound with mild soap and water o Cleanse wound with mild soap and water Wound #2 Left,Distal Lower Leg o Cleanse wound with mild soap and water o Cleanse wound with mild soap and water Anesthetic Wound #1 Left,Proximal Lower Leg o Topical Lidocaine 4% cream applied to wound bed prior to debridement Wound #2 Left,Distal Lower Leg o Topical Lidocaine 4% cream applied to wound bed prior to debridement Primary Wound Dressing Wound #1 Left,Proximal Lower Leg o Aquacel Ag Wound #2 Left,Distal Lower Leg o Aquacel Ag Secondary Dressing Wound #1 Left,Proximal Lower Leg o ABD pad Wound #2 Left,Distal Lower Leg o ABD pad Dressing Change Frequency Wound #1 Left,Proximal Lower Leg o Change dressing every week Kneisel, Batya D. ([id number]) Wound #2 Left,Distal Lower Leg o Change dressing every week Follow-up Appointments Wound #1 Left,Proximal Lower Leg o Return Appointment in 1 week. Wound #2 Left,Distal Lower Leg o Return Appointment in 1 week. Edema Control Wound #1 Left,Proximal Lower Leg o 2 Layer Compression System - Left Lower Extremity Wound #2 Left,Distal Lower Leg o 2 Layer Compression System - Left Lower Extremity Electronic Signature(s) Signed: 03/27/2015  12:32:56 PM By: [name], [name] Signed: 03/27/2015 5:10:45 PM By: [name] Entered By: [name] on 03/27/2015 12:04:56 Gropp, ORENA CAVAZOS ([id number]) -------------------------------------------------------------------------------- Problem List Details Patient Name: [name], [name] 03/27/2015 11:30 Date of Service: AM Medical Record [id number] Number: Patient Account Number: [id number] [birth year] (69 y.o. 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 03/27/2015 11:30 Date of Service: AM Medical Record [id number] Number: Patient Account Number: [id number] 03-Dec-1945 (69 y.o. Treating RN: Date of Birth/Sex: Female) Other Clinician: Primary Care Physician: [facility], [name] Treating [name] Referring Physician: Glendon Axe Physician/Extender: Weeks in Treatment: 7 Subjective Chief Complaint Information obtained from Patient Patient  presents to the wound care center for a consult due non healing wound 69 year old patient comes with a history of having a ulcer on the left lower extremity for the past 4 weeks. she says she's had swelling of both lower extremities for about a year after she started having prednisone. 02/07/2015 -- her vascular appointments obtained were in the first and third week of June. she is able to go to [place] and we will try and get her some earlier appointments. Other than that nothing else has changed in her management. History of Present Illness (HPI) 69 year old patient who is known to have diabetes mellitus type 2, chronic renal insufficiency, coronary artery disease, hypertension, hypercholesterolemia, temporal arteritis and inflammatory arthritiss also has a history of having a hysterectomy and some orthopedic related surgeries. The ulcer on  the left lower extremity started off as a blister and then. Got progressively worse. She does not have any fever or chills and has not had any recent surgical intervention for this. Her last hemoglobin A1c was 10.1 in September 2015. She has been recently put on doxycycline by her PCP. She is now also allergic to doxycycline and was this was changed over to Keflex. 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 She does see a cardiologist and also takes a diuretic. 02/07/2015 her arterial and venous duplex studies to be done have dates been given as the first and third week of June. This is at [facility]. We are going to try and get early appointments at [facility]. other than that nothing has changed in her management. 02/14/2015 -- we have been able to get her an appointment in [facility] on May 20 which is much earlier than her previous ones at [place]. She continues with her prednisone and her sugars are in the range of 150-200. 02/21/2015  We were able to get a vascular lab workup for her today and she is going to be there at 2:00 this afternoon. the swelling of her leg has gone down significantly but she still has some tenderness over the wounds. 02/28/2015 - She has had one of two vascular workups done, and this coming Tuesday has another, at [place] [facility]. She continues to be on steroid medications. She has significant sensitivity in her left lower extremity and has pain suggestive of neuropathic pain and I have asked her to address this with her primary care physician. 03/07/2015 -- The patient saw Dr. Lucky Cowboy for a consultation and he has had her arteries are okay but she has 2 incompetent veins on the left lower extremity and he is going to set her up for surgery. Official report is DELOISE, MARCHANT ([id number]) awaited. Addendum: Official reports are now available and on 03/04/2015. She was seen and lower extremity venous duplex exam was done. There was reflux present within the left greater saphenous vein below the knee and also the left small saphenous vein. Arterial duplex showed normal triphasic waveforms throughout the left lower extremity without any significant stenosis. Her ABIs were noncompressible bilaterally but a waveforms were normal and a digital pressures were normal bilaterally consistent with no significant arterial insufficiency. He has recommended endovenous ablation of both the left small saphenous and the left great saphenous vein. This would still be scheduled later. 03/14/2015 -- she has heard back from the vascular office and has surgery scheduled for sometime in July. Her rheumatologist has decreased her prednisone dosage but she still on it. She has also had cataract surgery in her right eye recently this week. 03/20/2015 - No new complaints today. Pain improved. No fever or chills. Tolerating 2 layer compression. Objective Constitutional Pulse regular. Respirations normal and  unlabored. Afebrile. Vitals Time Taken: 11:32 AM, Height: 65 in, Weight: 248 lbs, BMI: 41.3, Temperature: 97.8 F, Pulse: 70 bpm, Respiratory Rate: 16 breaths/min, Blood Pressure: 120/53 mmHg. Eyes Nonicteric. Reactive to light. Ears, Nose, Mouth, and Throat Lips, teeth, and gums WNL.Marland Kitchen Moist mucosa without lesions . Neck supple and nontender. No palpable supraclavicular or cervical adenopathy. Normal sized without goiter. Respiratory WNL. No retractions.. Cardiovascular Pedal Pulses WNL. No clubbing, cyanosis or edema. Psychiatric Judgement and insight Intact.. No evidence of depression, anxiety, or agitation.. Integumentary (Hair, Skin) [name],  Meili D. ([id number]) some of the wounds on the posterior part of completely healed and the others are looking much better and the edema has gone down completely.Marland Kitchen No crepitus or fluctuance. No peri-wound warmth or erythema. No masses.. Wound #1 status is Open. Original cause of wound was Blister. The wound is located on the Left,Proximal Lower Leg. The wound measures 0.7cm length x 0.4cm width x 0.1cm depth; 0.22cm^2 area and 0.022cm^3 volume. The wound is limited to skin breakdown. There is a none present amount of drainage noted. The wound margin is flat and intact. There is no granulation within the wound bed. There is a large (67-100%) amount of necrotic tissue within the wound bed including Eschar. The periwound skin appearance exhibited: Dry/Scaly. The periwound skin appearance did not exhibit: Callus, Crepitus, Excoriation, Fluctuance, Friable, Induration, Localized Edema, Rash, Scarring, Maceration, Moist, Atrophie Blanche, Cyanosis, Ecchymosis, Hemosiderin Staining, Mottled, Pallor, Rubor, Erythema. Periwound temperature was noted as No Abnormality. The periwound has tenderness on palpation. Wound #2 status is Open. Original cause of wound was Blister. The wound is located on the Left,Distal Lower Leg. The wound measures 4cm length x  1.8cm width x 0.1cm depth; 5.655cm^2 area and 0.565cm^3 volume. The wound is limited to skin breakdown. There is a none present amount of drainage noted. The wound margin is flat and intact. There is no granulation within the wound bed. There is a large (67-100%) amount of necrotic tissue within the wound bed including Eschar. The periwound skin appearance exhibited: Dry/Scaly. The periwound skin appearance did not exhibit: Callus, Crepitus, Excoriation, Fluctuance, Friable, Induration, Localized Edema, Rash, Scarring, Maceration, Moist, Atrophie Blanche, Cyanosis, Ecchymosis, Hemosiderin Staining, Mottled, Pallor, Rubor, Erythema. Periwound temperature was noted as No Abnormality. The periwound has tenderness on palpation. Wound #4 status is Healed - Epithelialized. Original cause of wound was Blister. The wound is located on the Left,Posterior Lower Leg. The wound measures 0cm length x 0cm width x 0cm depth; 0cm^2 area and 0cm^3 volume. The wound is limited to skin breakdown. There is a none present amount of drainage noted. The wound margin is flat and intact. There is no granulation within the wound bed. There is a large (67- 100%) amount of necrotic tissue within the wound bed including Eschar. The periwound skin appearance exhibited: Dry/Scaly, Hemosiderin Staining. The periwound skin appearance did not exhibit: Callus, Crepitus, Excoriation, Fluctuance, Friable, Induration, Localized Edema, Rash, Scarring, Maceration, Moist, Atrophie Blanche, Cyanosis, Ecchymosis, Mottled, Pallor, Rubor, Erythema. Periwound temperature was noted as No Abnormality. The periwound has tenderness on palpation. 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 ([id number]) Procedures Wound #2 Wound #2 is a Venous Leg Ulcer located on the Left,Distal Lower Leg . There was a Skin/Subcutaneous Tissue Debridement ([id number]) debridement with total area of 7.2 sq cm performed by Rob Mciver, Jackson Latino., MD. with the following instrument(s): Forceps and Scissors after achieving pain control using Lidocaine 4% Topical Solution. A time out was conducted prior to the start of the procedure. A Minimum amount of bleeding was controlled with Silver Nitrate. The procedure was tolerated well with a pain level of 5 throughout and a pain  level of 0 following the procedure. Post Debridement Measurements: 4cm length x 1.8cm width x 0.2cm depth; 1.131cm^3 volume. Plan Wound Cleansing: Wound #1 Left,Proximal Lower Leg: Cleanse wound with mild soap and water Cleanse wound with mild soap and water Wound #2 Left,Distal Lower Leg: Cleanse wound with mild soap and water Cleanse wound with mild soap and water Anesthetic: Wound #1 Left,Proximal Lower Leg: Topical Lidocaine 4% cream applied to wound bed prior to debridement Wound #2 Left,Distal Lower Leg: Topical Lidocaine 4% cream applied to wound bed prior to debridement Primary Wound Dressing: Wound #1 Left,Proximal Lower Leg: Aquacel Ag Wound #2 Left,Distal Lower Leg: Aquacel Ag Secondary Dressing: Wound #1 Left,Proximal Lower Leg: ABD pad Wound #2 Left,Distal Lower Leg: ABD pad Dressing Change Frequency: Wound #1 Left,Proximal Lower Leg: Change dressing every week Wound #2 Left,Distal Lower Leg: Change dressing every week Follow-up Appointments: Wound #1 Left,Proximal Lower Leg: Return Appointment in 1 week. SHELSEA, HANGARTNER D. ([id number]) Wound #2 Left,Distal Lower Leg: Return Appointment in 1 week. Edema Control: Wound #1 Left,Proximal Lower  Leg: 2 Layer Compression System - Left Lower Extremity Wound #2 Left,Distal Lower Leg: 2 Layer Compression System - Left Lower Extremity As noted in my note last week I have recommended that she get her rheumatologist to be in touch with me so that we can discuss the picture of the ulceration on her right lower extremity. She may need a biopsy to prove whether this is anything to do with a vasculitis. We will continue local care as before with silver alginate and a 2 layer wrap. She will come back and see me next week. Electronic Signature(s) Signed: 03/27/2015 12:32:56 PM By: [name], [name] Entered By: [name] on 03/27/2015 12:25:51 Carrier, Dennisse D. ([id number]) -------------------------------------------------------------------------------- SuperBill Details Patient Name: LEONORE, FRANKSON D. Date of Service: 03/27/2015 Medical Record Number: [id number] Patient Account Number: [id number] Date of Birth/Sex: 22-Mar-1946 (68 y.o. 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 Facility Procedures CPT4: Description Modifier Quantity Code [id number] 11042 - DEB SUBQ TISSUE 20 SQ CM/< 1 ICD-10 Description Diagnosis E11.622 Type 2 diabetes mellitus with other skin ulcer L97.322 Non-pressure chronic ulcer of left ankle with fat layer exposed I83.222  Varicose veins of left lower extremity with both ulcer of calf and  inflammation Physician Procedures CPT4: Description Modifier Quantity Code 6333545 62563 - WC PHYS SUBQ TISS 20 SQ CM 1 ICD-10 Description Diagnosis E11.622 Type 2 diabetes mellitus with other skin ulcer L97.322 Non-pressure chronic ulcer of left ankle with fat layer exposed I83.222  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 ([id number]) Entered By: [name] on 03/27/2015 68:11:57

## 2015-04-01 DIAGNOSIS — L97322 Non-pressure chronic ulcer of left ankle with fat layer exposed: Secondary | ICD-10-CM | POA: Diagnosis not present

## 2015-04-01 NOTE — Progress Notes (Signed)
[name], [name] ([id number]) Visit Report for 04/01/2015 Arrival Information Details Patient Name: [name], [name] 04/01/2015 10:15 Date of Service: AM Medical Record [id number] Number: Patient Account Number: [id number] 1946/02/21 (69 y.o. Treating RN: [name] Date of Birth/Sex: Female) Other Clinician: Primary Care Physician: Glendon Axe Treating Referring Physician: Glendon Axe Physician/Extender: Weeks in Treatment: 8 Visit Information History Since Last Visit Added or deleted any medications: No Patient Arrived: Cane Any new allergies or adverse reactions: No Arrival Time: 10:39 Had a fall or experienced change in No Accompanied By: self activities of daily living that may affect Transfer Assistance: None risk of falls: Patient Identification Verified: Yes Signs or symptoms of abuse/neglect since last No Secondary Verification Process Yes visito Completed: Hospitalized since last visit: No Patient Has Alerts: Yes Pain Present Now: No Patient Alerts: Patient on Blood Thinner Electronic Signature(s) Signed: 04/01/2015 2:45:57 PM By: [name] Entered By: [name] on 04/01/2015 11:07:36 Arismendez, Demri DMarland Kitchen ([id number]) -------------------------------------------------------------------------------- Encounter Discharge Information Details Patient Name: [name], [name] 04/01/2015 10:15 Date of Service: AM Medical Record [id number] Number: Patient Account Number: [id number] [birth year] (69 y.o. Treating RN: [name] Date of Birth/Sex: Female) Other Clinician: Primary Care Physician: Glendon Axe Treating Referring Physician: Robbins, [place]: Weeks in Treatment: 8 Encounter Discharge Information Items Discharge Pain Level: 0 Discharge Condition: Stable Ambulatory Status: Cane Discharge Destination: Home Private Transportation: Auto Accompanied By: self Schedule Follow-up Appointment: No Medication Reconciliation completed  and No provided to Patient/Care Aeriel Boulay: Clinical Summary of Care: Electronic Signature(s) Signed: 04/01/2015 2:45:57 PM By: [name] Entered By: [name] on 04/01/2015 11:08:39 Boehle, Tia Masker ([id number]) -------------------------------------------------------------------------------- Patient/Caregiver Education Details Patient Name: [name], [name] 04/01/2015 10:15 Date of Service: AM Medical Record [id number] Number: Patient Account Number: [id number] May 28, 1946 (69 y.o. Treating RN: [name] Date of Birth/Gender: Female) Other Clinician: Primary Care Physician: Glendon Axe Treating Referring Physician: Glendon Axe Physician/Extender: Weeks in Treatment: 8 Education Assessment Education Provided To: Patient Education Topics Provided Wound/Skin Impairment: Handouts: Other: call again and come in for rewrap if wrap slips or gets wet again Methods: Explain/Verbal Responses: State content correctly Electronic Signature(s) Signed: 04/01/2015 2:45:57 PM By: [name] Entered By: [name] on 04/01/2015 11:09:09

## 2015-04-03 ENCOUNTER — Encounter: Payer: PPO | Admitting: Surgery

## 2015-04-03 DIAGNOSIS — L97322 Non-pressure chronic ulcer of left ankle with fat layer exposed: Secondary | ICD-10-CM | POA: Diagnosis not present

## 2015-04-04 NOTE — Progress Notes (Addendum)
KAILYNN, SATTERLY ([id number]) Visit Report for 04/03/2015 Chief Complaint Document Details Patient Name: [name], [name] Date of Service: 04/03/2015 1:00 PM Medical Record Number: [id number] Patient Account Number: [id number] Date of Birth/Sex: 1946-01-22 (69 y.o. Female) Treating RN: Primary Care Physician: Glendon Axe Other Clinician: Referring Physician: Glendon Axe Treating Physician/Extender: Frann Rider in Treatment: 8 Information Obtained from: Patient Chief Complaint Patient presents to the wound care center for a consult due non healing wound 69 year old patient comes with a history of having a ulcer on the left lower extremity for the past 4 weeks. she says she's had swelling of both lower extremities for about a year after she started having prednisone. 02/07/2015 -- her vascular appointments obtained were in the first and third week of June. she is able to go to [place] and we will try and get her some earlier appointments. Other than that nothing else has changed in her management. Electronic Signature(s) Signed: 04/03/2015 4:41:50 PM By: [name], [name] Entered By: [name] on 04/03/2015 13:36:31 Volden, Jode D. ([id number]) -------------------------------------------------------------------------------- HPI Details Patient Name: Kristin, [name] Date of Service: 04/03/2015 1:00 PM Medical Record Number: [id number] Patient Account Number: [id number] Date of Birth/Sex: 07-21-1946 (69 y.o. Female) Treating RN: Primary Care Physician: Glendon Axe Other Clinician: Referring Physician: Glendon Axe Treating Physician/Extender: Frann Rider in Treatment: 8 History of Present Illness HPI Description: 69 year old patient who is known to have diabetes mellitus type 2, chronic renal insufficiency, coronary artery disease, hypertension, hypercholesterolemia, temporal arteritis and inflammatory arthritiss also has a history of having a hysterectomy  and some orthopedic related surgeries. The ulcer on the left lower extremity started off as a blister and then. Got progressively worse. She does not have any fever or chills and has not had any recent surgical intervention for this. Her last hemoglobin A1c was 10.1 in September 2015. She has been recently put on doxycycline by her PCP. She is now also allergic to doxycycline and was this was changed over to Keflex. 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 She does see a cardiologist and also takes a diuretic. 02/07/2015 her arterial and venous duplex studies to be done have dates been given as the first and third week of June. This is at [facility]. We are going to try and get early appointments at [facility]. other than that nothing has changed in her management. 02/14/2015 -- we have been able to get her an appointment in [facility] on May 20 which is much earlier than her previous ones at [facility]. She continues with her prednisone and her sugars are in the range of 150-200. 02/21/2015 We were able to get a vascular lab workup for her today and she is going to be there at 2:00 this afternoon. the swelling of her leg has gone down significantly but she still has some tenderness over the wounds. 02/28/2015 - She has had one of two vascular workups done, and this coming Tuesday has another, at [facility]. She continues to be on steroid medications. She has significant sensitivity in her left lower extremity and has pain suggestive of neuropathic pain and I have asked her to address this with her primary care physician. 03/07/2015 -- The patient saw Dr. 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 Official report is awaited. Addendum:  Official reports are now available and on 03/04/2015. She was seen and lower extremity venous duplex exam was done. There was reflux present within the left greater saphenous vein below the knee and also the left small saphenous vein. Arterial duplex showed normal triphasic waveforms throughout the left lower extremity without any significant stenosis. Her ABIs were noncompressible bilaterally but a waveforms were normal and a digital pressures were normal bilaterally consistent with no significant arterial insufficiency. He has recommended endovenous ablation of both the left small saphenous and the left great saphenous vein. This would still be scheduled later. 03/14/2015 -- she has heard back from the vascular office and has surgery scheduled for sometime in July. Her rheumatologist has decreased her prednisone dosage but she still on it. She has also had cataract surgery in her right eye recently this week. SHULAMIT, DONOFRIO ([id number]) 03/20/2015 - No new complaints today. Pain improved. No fever or chills. Tolerating 2 layer compression. Electronic Signature(s) Signed: 04/03/2015 4:41:50 PM By: [name], [name] Entered By: [name] on 04/03/2015 13:36:39 Holz, Deandrea DMarland Kitchen ([id number]) -------------------------------------------------------------------------------- Physical Exam Details Patient Name: Kristin, [name] Date of Service: 04/03/2015 1:00 PM Medical Record Number: [id number] Patient Account Number: [id number] Date of Birth/Sex: [birth year] (69 y.o. Female) Treating RN: Primary Care Physician: Glendon Axe Other Clinician: Referring Physician: Glendon Axe Treating Physician/Extender: Frann Rider in Treatment: 8 Constitutional . Pulse regular. Respirations normal and unlabored. Afebrile. . Eyes Nonicteric. Reactive to light. Ears, Nose, Mouth, and Throat Lips, teeth, and gums WNL.Marland Kitchen Moist mucosa without lesions . Neck supple and nontender. No palpable  supraclavicular or cervical adenopathy. Normal sized without goiter. Respiratory WNL. No retractions.. Cardiovascular Pedal Pulses WNL. No clubbing, cyanosis or edema. Integumentary (Hair, Skin) The wound on the posterior part of the left lower extremity has healed. The anterior wound continues to have dense eschar and it is difficult to debride it though I have tried with a forcep and scissors.. No crepitus or fluctuance. No peri-wound warmth or erythema. No masses.Marland Kitchen Psychiatric Judgement and insight Intact.. No evidence of depression, anxiety, or agitation.. Electronic Signature(s) Signed: 04/03/2015 4:41:50 PM By: [name], [name] Entered By: [name] on 04/03/2015 13:37:39 Dutil, Andreina DMarland Kitchen ([id number]) -------------------------------------------------------------------------------- Physician Orders Details Patient Name: JOSSLYN, CIOLEK D. Date of Service: 04/03/2015 1:00 PM Medical Record Number: [id number] Patient Account Number: [id number] Date of Birth/Sex: [birth year] (68 y.o. Female) Treating RN: [name] Primary Care Physician: Glendon Axe Other Clinician: Referring Physician: Glendon Axe Treating Physician/Extender: Frann Rider in Treatment: 8 Verbal / Phone Orders: Yes Clinician: [name] Read Back and Verified: Yes Diagnosis Coding Wound Cleansing Wound #1 Left,Proximal Lower Leg o Cleanse wound with mild soap and water o Cleanse wound with mild soap and water Wound #2 Left,Distal Lower Leg o Cleanse wound with mild soap and water o Cleanse wound with mild soap and water Anesthetic Wound #1 Left,Proximal Lower Leg o Topical Lidocaine 4% cream applied to wound bed prior to debridement Wound #2 Left,Distal Lower Leg o Topical Lidocaine 4% cream applied to wound bed prior to debridement Primary Wound Dressing Wound #1 Left,Proximal Lower Leg o Aquacel Ag Wound #2 Left,Distal Lower Leg o Aquacel Ag Secondary  Dressing Wound #1 Left,Proximal Lower Leg o Boardered Foam Dressing Wound #2 Left,Distal Lower Leg o Boardered Foam Dressing Dressing Change Frequency Wound #1 Left,Proximal Lower Leg o Change dressing every other day. Wound #2 Left,Distal Lower Leg   o Change dressing every other day. REBBECCA, OSUNA ([id number]) Follow-up Appointments Wound #1 Left,Proximal Lower Leg o Return Appointment in 2 weeks. Wound #2 Left,Distal Lower Leg o Return Appointment in 2 weeks. Electronic Signature(s) Signed: 04/03/2015 4:41:50 PM By: [name], [name] Signed: 04/03/2015 5:03:06 PM By: [name] Entered By: [name] on 04/03/2015 13:32:20 Hallquist, Bailey D. ([id number]) -------------------------------------------------------------------------------- Problem List Details Patient Name: REFUGIA, LANEVE D. Date of Service: 04/03/2015 1:00 PM Medical Record Number: [id number] Patient Account Number: [id number] Date of Birth/Sex: 03-Nov-1945 (68 y.o. 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  ([id number]) -------------------------------------------------------------------------------- Progress Note Details Patient Name: Bubolz, Sonakshi D. Date of Service: 04/03/2015 1:00 PM Medical Record Number: [id number] Patient Account Number: [id number] Date of Birth/Sex: 08-Apr-1946 (68 y.o. Female) Treating RN: Primary Care Physician: Glendon Axe Other Clinician: Referring Physician: Glendon Axe Treating Physician/Extender: Frann Rider in Treatment: 8 Subjective Chief Complaint Information obtained from Patient Patient presents to the wound care center for a consult due non healing wound 68 year old patient comes with a history of having a ulcer on the left lower extremity for the past 4 weeks. she says she's had swelling of both lower extremities for about a year after she started having prednisone. 02/07/2015 -- her vascular appointments obtained were in the first and third week of June. she is able to go to [place] and we will try and get her some earlier appointments. Other than that nothing else has changed in her management. History of Present Illness (HPI) 69 year old patient who is known to have diabetes mellitus type 2, chronic renal insufficiency, coronary artery disease, hypertension, hypercholesterolemia, temporal arteritis and inflammatory arthritiss also has a history of having a hysterectomy and some orthopedic related surgeries. The ulcer on the left lower extremity started off as a blister and then. Got progressively worse. She does not have any fever or chills and has not had any recent surgical intervention for this. Her last hemoglobin A1c was 10.1 in September 2015. She has been recently put on doxycycline by her PCP. She is now also allergic to doxycycline and was this was changed over to Keflex. 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 She does  see a cardiologist and also takes a diuretic. 02/07/2015 her arterial and venous duplex studies to be done have dates been given as the first and third week of June. This is at [facility]. We are going to try and get early appointments at [facility]. other than that nothing has changed in her management. 02/14/2015 -- we have been able to get her an appointment in [facility] on May 20 which is much earlier than her previous ones at [place]. She continues  with her prednisone and her sugars are in the range of 150-200. 02/21/2015 We were able to get a vascular lab workup for her today and she is going to be there at 2:00 this afternoon. the swelling of her leg has gone down significantly but she still has some tenderness over the wounds. 02/28/2015 - She has had one of two vascular workups done, and this coming Tuesday has another, at [facility]. She continues to be on steroid medications. She has significant sensitivity in her left lower extremity and has pain suggestive of neuropathic pain and I have asked her to address this with her primary care physician. 03/07/2015 -- The patient saw Dr. Lucky Cowboy for a consultation and he has had her arteries are okay but she has 2 incompetent veins on the left lower extremity and he is going to set her up for surgery. Official report is awaited. Addendum: Official reports are now available and on 03/04/2015. She was seen and lower extremity Ogborn, Galya D. ([id number]) venous duplex exam was done. There was reflux present within the left greater saphenous vein below the knee and also the left small saphenous vein. Arterial duplex showed normal triphasic waveforms throughout the left lower extremity without any significant stenosis. Her ABIs were noncompressible bilaterally but a waveforms were normal and a digital pressures were normal bilaterally consistent with no significant arterial insufficiency. He has recommended  endovenous ablation of both the left small saphenous and the left great saphenous vein. This would still be scheduled later. 03/14/2015 -- she has heard back from the vascular office and has surgery scheduled for sometime in July. Her rheumatologist has decreased her prednisone dosage but she still on it. She has also had cataract surgery in her right eye recently this week. 03/20/2015 - No new complaints today. Pain improved. No fever or chills. Tolerating 2 layer compression. Objective Constitutional Pulse regular. Respirations normal and unlabored. Afebrile. Vitals Time Taken: 1:10 PM, Height: 65 in, Weight: 248 lbs, BMI: 41.3, Temperature: 98.4 F, Pulse: 80 bpm, Respiratory Rate: 18 breaths/min, Blood Pressure: 128/58 mmHg. Eyes Nonicteric. Reactive to light. Ears, Nose, Mouth, and Throat Lips, teeth, and gums WNL.Marland Kitchen Moist mucosa without lesions . Neck supple and nontender. No palpable supraclavicular or cervical adenopathy. Normal sized without goiter. Respiratory WNL. No retractions.. Cardiovascular Pedal Pulses WNL. No clubbing, cyanosis or edema. Psychiatric Judgement and insight Intact.. No evidence of depression, anxiety, or agitation.. Integumentary (Hair, Skin) The wound on the posterior part of the left lower extremity has healed. The anterior wound continues to have dense eschar and it is difficult to debride it though I have tried with a forcep and scissors.. No crepitus Hanahan, Shemicka D. ([id number]) or fluctuance. No peri-wound warmth or erythema. No masses.. Wound #1 status is Open. Original cause of wound was Blister. The wound is located on the Left,Proximal Lower Leg. The wound measures 0cm length x 0cm width x 0cm depth; 0cm^2 area and 0cm^3 volume. The wound is limited to skin breakdown. There is a none present amount of drainage noted. The wound margin is flat and intact. There is no granulation within the wound bed. There is no necrotic tissue within the wound  bed. The periwound skin appearance did not exhibit: Callus, Crepitus, Excoriation, Fluctuance, Friable, Induration, Localized Edema, Rash, Scarring, Dry/Scaly, Maceration, Moist, Atrophie Blanche, Cyanosis, Ecchymosis, Hemosiderin Staining, Mottled, Pallor, Rubor, Erythema. Periwound temperature was noted as No Abnormality. The periwound has tenderness on palpation. Wound #2 status is Open. Original cause of wound  was Blister. The wound is located on the Left,Distal Lower Leg. The wound measures 3.6cm length x 2cm width x 0.1cm depth; 5.655cm^2 area and 0.565cm^3 volume. The wound is limited to skin breakdown. There is no tunneling or undermining noted. There is a none present amount of drainage noted. The wound margin is flat and intact. There is no granulation within the wound bed. There is a large (67-100%) amount of necrotic tissue within the wound bed including Eschar and Adherent Slough. The periwound skin appearance exhibited: Dry/Scaly. The periwound skin appearance did not exhibit: Callus, Crepitus, Excoriation, Fluctuance, Friable, Induration, Localized Edema, Rash, Scarring, Maceration, Moist, Atrophie Blanche, Cyanosis, Ecchymosis, Hemosiderin Staining, Mottled, Pallor, Rubor, Erythema. Periwound temperature was noted as No Abnormality. The periwound has tenderness on palpation. The wound on the posterior part of the left lower extremity has healed. The anterior wound continues to have dense eschar and it is difficult to debride it though I have tried with a forcep and scissors. 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 CARROL, HOUGLAND ([id number]) Still awaiting a call from a rheumatologist and we will continue to see her on regular basis once she is back from her vacation. Plan Wound Cleansing: Wound #1 Left,Proximal Lower Leg: Cleanse wound with mild soap and water Cleanse wound with mild soap and water Wound #2 Left,Distal Lower Leg: Cleanse wound with mild soap and water Cleanse wound with mild soap and water Anesthetic: Wound #1 Left,Proximal Lower Leg: Topical Lidocaine 4% cream applied to wound bed prior to debridement Wound #2 Left,Distal Lower Leg: Topical Lidocaine 4% cream applied to wound bed prior to debridement Primary Wound Dressing: Wound #1 Left,Proximal Lower Leg: Aquacel Ag Wound #2 Left,Distal Lower Leg: Aquacel Ag Secondary Dressing: Wound #1 Left,Proximal Lower Leg: Boardered Foam Dressing Wound #2 Left,Distal Lower Leg: Boardered Foam Dressing Dressing Change Frequency: Wound #1 Left,Proximal Lower Leg: Change dressing every other day. Wound #2 Left,Distal Lower Leg: Change dressing every other day. Follow-up Appointments: Wound #1 Left,Proximal Lower Leg: Return Appointment in 2 weeks. Wound #2 Left,Distal Lower Leg: Return Appointment in 2 weeks. Debraann has benefited from compression with a 2 layer wrap however she is going to be out for about 10 days on medication and hence this time we will use calcium alginate with silver and her compression stockings which I presume the 20-30 mm variety. JOZEE, HAMMER ([id number]) Still awaiting a call from a rheumatologist and we will continue to see her on regular basis once she is back from her vacation. Electronic Signature(s) Signed:  04/08/2015 5:03:08 PM By: [name], [name] Previous Signature: 04/03/2015 4:41:50 PM Version By: [name], [name] Entered By: [name] on 04/08/2015 17:00:38 Rowser, Kwanza DMarland Kitchen ([id number]) -------------------------------------------------------------------------------- SuperBill Details Patient Name: LECHELLE, WRIGLEY D. Date of Service: 04/03/2015 Medical Record Number: [id number] Patient Account Number: [id number] Date of Birth/Sex: 09/19/1946 (68 y.o.  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 Facility Procedures CPT4 Code: [id number] Description: 99213 - WOUND CARE VISIT-LEV 3 EST PT Modifier: Quantity: 1 Physician Procedures CPT4: Description Modifier Quantity Code [id number] 93112 - WC PHYS LEVEL 3 - EST PT 1 ICD-10 Description Diagnosis E11.622 Type 2 diabetes mellitus with other skin ulcer L97.322 Non-pressure chronic ulcer of left ankle with fat layer exposed I83.222  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

## 2015-04-05 NOTE — Progress Notes (Addendum)
Kristin, [name] ([id number]) Visit Report for 04/03/2015 Arrival Information Details Patient Name: Kristin, [name] Date of Service: 04/03/2015 1:00 PM Medical Record Number: [id number] Patient Account Number: [id number] Date of Birth/Sex: [birth year] (68 y.o. Female) Treating RN: [name] Primary Care Physician: [name] Other Clinician: Referring Physician: Glendon [name] Treating Physician/Extender: [name] in Treatment: 8 Visit Information History Since Last Visit Added or deleted any medications: No Patient Arrived: Ambulatory Any new allergies or adverse reactions: No Arrival Time: 13:10 Had a fall or experienced change in No Accompanied By: self activities of daily living that may affect Transfer Assistance: None risk of falls: Patient Identification Verified: Yes Signs or symptoms of abuse/neglect since last No Secondary Verification Process Yes visito Completed: Hospitalized since last visit: No Patient Has Alerts: Yes Has Dressing in Place as Prescribed: Yes Patient Alerts: Patient on Blood Has Compression in Place as Prescribed: Yes Thinner Pain Present Now: No Electronic Signature(s) Signed: 04/04/2015 4:53:24 PM By: [name] Entered By: Kristin Cool, RN, [name], Kristin on 04/03/2015 13:10:49 [name], [name] Kitchen ([id number]) -------------------------------------------------------------------------------- Clinic Level of Care Assessment Details Patient Name: [name] [name] Date of Service: 04/03/2015 1:00 PM Medical Record Number: [id number] Patient Account Number: [id number] Date of Birth/Sex: 10-Aug-1946 (68 y.o. Female) Treating RN: [name] Primary Care Physician: [name] Other Clinician: Referring Physician: Glendon [name] Treating Physician/Extender: [name] in Treatment: 8 Clinic Level of Care Assessment Items TOOL 4 Quantity Score []  - Use when only an EandM is performed on FOLLOW-UP visit 0 ASSESSMENTS -  Nursing Assessment / Reassessment X - Reassessment of Co-morbidities (includes updates in patient status) 1 10 X - Reassessment of Adherence to Treatment Plan 1 5 ASSESSMENTS - Wound and Skin Assessment / Reassessment []  - Simple Wound Assessment / Reassessment - one wound 0 X - Complex Wound Assessment / Reassessment - multiple wounds 2 5 []  - Dermatologic / Skin Assessment (not related to wound area) 0 ASSESSMENTS - Focused Assessment X - Circumferential Edema Measurements - multi extremities 1 5 []  - Nutritional Assessment / Counseling / Intervention 0 X - Lower Extremity Assessment (monofilament, tuning fork, pulses) 1 5 []  - Peripheral Arterial Disease Assessment (using hand held doppler) 0 ASSESSMENTS - Ostomy and/or Continence Assessment and Care []  - Incontinence Assessment and Management 0 []  - Ostomy Care Assessment and Management (repouching, etc.) 0 PROCESS - Coordination of Care X - Simple Patient / Family Education for ongoing care 1 15 []  - Complex (extensive) Patient / Family Education for ongoing care 0 []  - Staff obtains Programmer, systems, Records, Test Results / Process Orders 0 []  - Staff telephones HHA, Nursing Homes / Clarify orders / etc 0 []  - Routine Transfer to another Facility (non-emergent condition) 0 [name], [name] ([id number]) []  - Routine Hospital Admission (non-emergent condition) 0 []  - New Admissions / Biomedical engineer / Ordering NPWT, Apligraf, etc. 0 []  - Emergency Hospital Admission (emergent condition) 0 X - Simple Discharge Coordination 1 10 []  - Complex (extensive) Discharge Coordination 0 PROCESS - Special Needs []  - Pediatric / Minor Patient Management 0 []  - Isolation Patient Management 0 []  - Hearing / Language / Visual special needs 0 []  - Assessment of Community assistance (transportation, [name], etc.) 0 []  - Additional assistance / Altered mentation 0 []  - Support Surface(s) Assessment (bed, cushion, seat, etc.) 0 INTERVENTIONS -  Wound Cleansing / Measurement []  - Simple Wound Cleansing - one wound 0 X - Complex Wound Cleansing - multiple wounds 2  5 X - Wound Imaging (photographs - any number of wounds) 1 5 []  - Wound Tracing (instead of photographs) 0 []  - Simple Wound Measurement - one wound 0 X - Complex Wound Measurement - multiple wounds 2 5 INTERVENTIONS - Wound Dressings X - Small Wound Dressing one or multiple wounds 2 10 []  - Medium Wound Dressing one or multiple wounds 0 []  - Large Wound Dressing one or multiple wounds 0 []  - Application of Medications - topical 0 []  - Application of Medications - injection 0 INTERVENTIONS - Miscellaneous []  - External ear exam 0 [name], [name] ([id number]) []  - Specimen Collection (cultures, biopsies, blood, body fluids, etc.) 0 []  - Specimen(s) / Culture(s) sent or taken to Lab for analysis 0 []  - Patient Transfer (multiple staff / [name]) 0 []  - Simple Staple / Suture removal (25 or less) 0 []  - Complex Staple / Suture removal (26 or more) 0 []  - Hypo / Hyperglycemic Management (close monitor of Blood Glucose) 0 []  - Ankle / Brachial Index (ABI) - do not check if billed separately 0 X - Vital Signs 1 5 Has the patient been seen at the hospital within the last three years: Yes Total Score: 110 Level Of Care: New/Established - Level 3 Electronic Signature(s) Signed: 04/03/2015 4:02:15 PM By: [name] Entered By: [name] on 04/03/2015 16:02:14 [name], [name] ([id number]) -------------------------------------------------------------------------------- Encounter Discharge Information Details Patient Name: Kristin, [name] Date of Service: 04/03/2015 1:00 PM Medical Record Number: [id number] Patient Account Number: [id number] Date of Birth/Sex: Jul 09, 1946 (68 y.o. Female) Treating RN: Primary Care Physician: [name] Other Clinician: Referring Physician: Glendon [name] Treating Physician/Extender: [name] in  Treatment: 8 Encounter Discharge Information Items Discharge Pain Level: 0 Discharge Condition: Stable Ambulatory Status: Ambulatory Discharge Destination: Home Transportation: Private Auto Accompanied By: self Schedule Follow-up Appointment: Yes Medication Reconciliation completed and provided to Patient/Care Yes Detrich Rakestraw: Provided on Clinical Summary of Care: 04/03/2015 Form Type Recipient Paper Patient LM Electronic Signature(s) Signed: 04/04/2015 4:53:24 PM By: Kristin Cool RN, [name], [name] Previous Signature: 04/03/2015 1:39:28 PM Version By: Ruthine Dose Entered By: Kristin Cool RN, [name], Kristin on 04/03/2015 13:42:31 Percival, [name] Kitchen ([id number]) -------------------------------------------------------------------------------- Lower Extremity Assessment Details Patient Name: FALLYN, MUNNERLYN [name] Date of Service: 04/03/2015 1:00 PM Medical Record Number: [id number] Patient Account Number: [id number] Date of Birth/Sex: 1946/02/13 (68 y.o. Female) Treating RN: [name] Primary Care Physician: [name] Other Clinician: Referring Physician: Glendon [name] Treating Physician/Extender: [name] in Treatment: 8 Edema Assessment Assessed: [Left: No] [Right: No] E[Left: dema] [Right: :] Calf Left: Right: Point of Measurement: 33 cm From Medial Instep 34.5 cm cm Ankle Left: Right: Point of Measurement: 12 cm From Medial Instep 22.5 cm cm Vascular Assessment Pulses: Posterior Tibial Dorsalis Pedis Palpable: [Left:Yes] Extremity colors, hair growth, and conditions: Extremity Color: [Left:Hyperpigmented] Hair Growth on Extremity: [Left:No] Temperature of Extremity: [Left:Warm] Capillary Refill: [Left:< 3 seconds] Toe Nail Assessment Left: Right: Thick: No Discolored: No Deformed: No Improper Length and Hygiene: No Electronic Signature(s) Signed: 04/04/2015 4:53:24 PM By: [name] Entered By: Kristin Cool, RN, [name], Kristin on 04/03/2015 13:20:48 Twyford, Nabila [name] Kitchen  ([id number]) -------------------------------------------------------------------------------- Multi Wound Chart Details Patient Name: SHEA, SWALLEY [name] Date of Service: 04/03/2015 1:00 PM Medical Record Number: [id number] Patient Account Number: [id number] Date of Birth/Sex: 1946/03/04 (68 y.o. Female) Treating RN: [name] Primary Care Physician: [name] Other Clinician: Referring Physician: Glendon [name] Treating Physician/Extender: [name] in Treatment: 8 Vital  Signs Height(in): 65 Pulse(bpm): 80 Weight(lbs): 248 Blood Pressure 128/58 (mmHg): Body Mass Index(BMI): 41 Temperature(F): 98.4 Respiratory Rate 18 (breaths/min): Photos: [N/A:N/A] Wound Location: Left Lower Leg - Proximal Left Lower Leg - Distal N/A Wounding Event: Blister Blister N/A Primary Etiology: Venous Leg Ulcer Venous Leg Ulcer N/A Comorbid History: Cataracts, Asthma, Cataracts, Asthma, N/A Coronary Artery Disease, Coronary Artery Disease, Hypertension, Type II Hypertension, Type II Diabetes, Osteoarthritis, Diabetes, Osteoarthritis, Neuropathy Neuropathy Date Acquired: 12/30/2014 12/30/2014 N/A Weeks of Treatment: 8 8 N/A Wound Status: Open Open N/A Clustered Wound: No Yes N/A Measurements L x W x [name] 0x0x0 3.6x2x0.1 N/A (cm) Area (cm) : 0 5.655 N/A Volume (cm) : 0 0.565 N/A % Reduction in Area: 100.00% -699.90% N/A Larcom, [name] ([id number]) % Reduction in Volume: 100.00% -695.80% N/A Classification: Unclassifiable Full Thickness Without N/A Exposed Support Structures HBO Classification: Grade 0 Grade 1 N/A Exudate Amount: None Present None Present N/A Wound Margin: Flat and Intact Flat and Intact N/A Granulation Amount: None Present (0%) None Present (0%) N/A Necrotic Amount: None Present (0%) Large (67-100%) N/A Necrotic Tissue: N/A Eschar, Adherent Slough N/A Exposed Structures: Fascia: No Fascia: No N/A Fat: No Fat: No Tendon: No Tendon: No Muscle: No Muscle:  No Joint: No Joint: No Bone: No Bone: No Limited to Skin Limited to Skin Breakdown Breakdown Epithelialization: Large (67-100%) None N/A Periwound Skin Texture: Edema: No Edema: No N/A Excoriation: No Excoriation: No Induration: No Induration: No Callus: No Callus: No Crepitus: No Crepitus: No Fluctuance: No Fluctuance: No Friable: No Friable: No Rash: No Rash: No Scarring: No Scarring: No Periwound Skin Maceration: No Dry/Scaly: Yes N/A Moisture: Moist: No Maceration: No Dry/Scaly: No Moist: No Periwound Skin Color: Atrophie Blanche: No Atrophie Blanche: No N/A Cyanosis: No Cyanosis: No Ecchymosis: No Ecchymosis: No Erythema: No Erythema: No Hemosiderin Staining: No Hemosiderin Staining: No Mottled: No Mottled: No Pallor: No Pallor: No Rubor: No Rubor: No Temperature: No Abnormality No Abnormality N/A Tenderness on Yes Yes N/A Palpation: Wound Preparation: Ulcer Cleansing: Ulcer Cleansing: Other: N/A Rinsed/Irrigated with soap and water Saline Topical Anesthetic Applied: Other: lidocaine 4% Treatment Notes AKSHARA, BLUMENTHAL ([id number]) Electronic Signature(s) Signed: 04/03/2015 5:03:06 PM By: [name] Entered By: [name] on 04/03/2015 13:29:16 Rizzi, [name] Kitchen ([id number]) -------------------------------------------------------------------------------- Multi-Disciplinary Care Plan Details Patient Name: Kristin, ROTHENBERGER [name] Date of Service: 04/03/2015 1:00 PM Medical Record Number: [id number] Patient Account Number: [id number] Date of Birth/Sex: 18-Jun-1946 (68 y.o. Female) Treating RN: [name] Primary Care Physician: [name] Other Clinician: Referring Physician: Glendon [name] Treating Physician/Extender: [name] in Treatment: 8 Active Inactive Orientation to the Wound Care Program Nursing Diagnoses: Knowledge deficit related to the wound healing center program Goals: Patient/caregiver will verbalize  understanding of the [place] Program Date Initiated: 01/31/2015 Goal Status: Active Interventions: Provide education on orientation to the wound center Notes: Venous Leg Ulcer Nursing Diagnoses: Potential for venous Insuffiency (use before diagnosis confirmed) Goals: Non-invasive venous studies are completed as ordered Date Initiated: 01/31/2015 Goal Status: Active Patient/caregiver will verbalize understanding of disease process and disease management Date Initiated: 01/31/2015 Goal Status: Active Interventions: Assess peripheral edema status every visit. Notes: Wound/Skin Impairment Nursing Diagnoses: Impaired tissue integrity Knowledge deficit related to smoking impact on wound healing Kristin, Tya [name] ([id number]) Goals: Patient/caregiver will verbalize understanding of skin care regimen Date Initiated: 01/31/2015 Goal Status: Active Ulcer/skin breakdown will heal within 14 weeks Date Initiated: 01/31/2015 Goal Status: Active Interventions: Assess ulceration(s) every visit Notes: Electronic Signature(s) Signed: 04/03/2015 5:03:06 PM By:  Dorthy, Di Kindle Entered By: [name] on 04/03/2015 13:28:39 Sato, [name] ([id number]) -------------------------------------------------------------------------------- Pain Assessment Details Patient Name: Kristin, [name] Date of Service: 04/03/2015 1:00 PM Medical Record Number: [id number] Patient Account Number: [id number] Date of Birth/Sex: Aug 09, 1946 (68 y.o. Female) Treating RN: [name] Primary Care Physician: [name] Other Clinician: Referring Physician: Glendon [name] Treating Physician/Extender: [name] in Treatment: 8 Active Problems Location of Pain Severity and Description of Pain Patient Has Paino No Site Locations Pain Management and Medication Current Pain Management: Electronic Signature(s) Signed: 04/04/2015 4:53:24 PM By: [name] Entered By: Kristin Cool, RN, [name], Kristin on  04/03/2015 13:10:56 Chismar, [name] ([id number]) -------------------------------------------------------------------------------- Patient/Caregiver Education Details Patient Name: [name] Date of Service: 04/03/2015 1:00 PM Medical Record Number: [id number] Patient Account Number: [id number] Date of Birth/Gender: 02/08/1946 (68 y.o. Female) Treating RN: [name] Primary Care Physician: [name] Other Clinician: Referring Physician: Glendon [name] Treating Physician/Extender: [name] in Treatment: 8 Education Assessment Education Provided To: Patient Education Topics Provided Wound/Skin Impairment: Handouts: Other: wound care as ordered whie on vacation Methods: Demonstration, Explain/Verbal Responses: State content correctly Electronic Signature(s) Signed: 04/03/2015 5:03:06 PM By: [name] Entered By: [name] on 04/03/2015 13:36:18 Friedli, Chaniqua [name] ([id number]) -------------------------------------------------------------------------------- Wound Assessment Details Patient Name: Mcshea, [name] [name] Date of Service: 04/03/2015 1:00 PM Medical Record Number: [id number] Patient Account Number: [id number] Date of Birth/Sex: [birth year] (68 y.o. Female) Treating RN: [name] Primary Care Physician: [name] Other Clinician: Referring Physician: Glendon [name] Treating Physician/Extender: [name] in Treatment: 8 Wound Status Wound Number: 1 Primary Venous Leg Ulcer Etiology: Wound Location: Left Lower Leg - Proximal Wound Open Wounding Event: Blister Status: Date Acquired: 12/30/2014 Comorbid Cataracts, Asthma, Coronary Artery Weeks Of Treatment: 8 History: Disease, Hypertension, Type II Clustered Wound: No Diabetes, Osteoarthritis, Neuropathy Photos Photo Uploaded By: Kristin Cool, RN, [name], Kristin on 04/03/2015 13:26:16 Wound Measurements Length: (cm) 0 % Reduction i Width: (cm) 0 % Reduction i Depth: (cm) 0  Epithelializa Area: (cm) 0 Volume: (cm) 0 n Area: 100% n Volume: 100% tion: Large (67-100%) Wound Description Classification: Unclassifiable Diabetic Severity Earleen Newport): Grade 0 Wound Margin: Flat and Intact Exudate Amount: None Present Rolfe, Farrie [name] ([id number]) Foul Odor After Cleansing: No Wound Bed Granulation Amount: None Present (0%) Exposed Structure Necrotic Amount: None Present (0%) Fascia Exposed: No Fat Layer Exposed: No Tendon Exposed: No Muscle Exposed: No Joint Exposed: No Bone Exposed: No Limited to Skin Breakdown Periwound Skin Texture Texture Color No Abnormalities Noted: No No Abnormalities Noted: No Callus: No Atrophie Blanche: No Crepitus: No Cyanosis: No Excoriation: No Ecchymosis: No Fluctuance: No Erythema: No Friable: No Hemosiderin Staining: No Induration: No Mottled: No Localized Edema: No Pallor: No Rash: No Rubor: No Scarring: No Temperature / Pain Moisture Temperature: No Abnormality No Abnormalities Noted: No Tenderness on Palpation: Yes Dry / Scaly: No Maceration: No Moist: No Wound Preparation Ulcer Cleansing: Rinsed/Irrigated with Saline Electronic Signature(s) Signed: 04/04/2015 4:53:24 PM By: [name] Entered By: Kristin Cool, RN, [name], Kristin on 04/03/2015 13:22:23 Hilmer, Olamide [name] Kitchen ([id number]) -------------------------------------------------------------------------------- Wound Assessment Details Patient Name: JAELEY, WIKER [name] Date of Service: 04/03/2015 1:00 PM Medical Record Number: [id number] Patient Account Number: [id number] Date of Birth/Sex: 01/05/1946 (68 y.o. Female) Treating RN: [name] Primary Care Physician: [name] Other Clinician: Referring Physician: Glendon [name] Treating Physician/Extender: [name] in Treatment: 8 Wound Status Wound Number: 2 Primary Venous Leg Ulcer Etiology: Wound Location: Left Lower Leg - Distal  Wound Open Wounding Event:  Blister Status: Date Acquired: 12/30/2014 Comorbid Cataracts, Asthma, Coronary Artery Weeks Of Treatment: 8 History: Disease, Hypertension, Type II Clustered Wound: Yes Diabetes, Osteoarthritis, Neuropathy Photos Wound Measurements Length: (cm) 3.6 % Reduction in Width: (cm) 2 % Reduction in Depth: (cm) 0.1 Epithelializati Area: (cm) 5.655 Tunneling: Volume: (cm) 0.565 Undermining: Area: -699.9% Volume: -695.8% on: None No No Wound Description Full Thickness Without Classification: Exposed Support Structures Diabetic Severity Grade 1 (Wagner): Wound Margin: Flat and Intact Christman, Eden [name] ([id number]) Foul Odor After Cleansing: No Exudate Amount: None Present Wound Bed Granulation Amount: None Present (0%) Exposed Structure Necrotic Amount: Large (67-100%) Fascia Exposed: No Necrotic Quality: Eschar, Adherent Slough Fat Layer Exposed: No Tendon Exposed: No Muscle Exposed: No Joint Exposed: No Bone Exposed: No Limited to Skin Breakdown Periwound Skin Texture Texture Color No Abnormalities Noted: No No Abnormalities Noted: No Callus: No Atrophie Blanche: No Crepitus: No Cyanosis: No Excoriation: No Ecchymosis: No Fluctuance: No Erythema: No Friable: No Hemosiderin Staining: No Induration: No Mottled: No Localized Edema: No Pallor: No Rash: No Rubor: No Scarring: No Temperature / Pain Moisture Temperature: No Abnormality No Abnormalities Noted: No Tenderness on Palpation: Yes Dry / Scaly: Yes Maceration: No Moist: No Wound Preparation Ulcer Cleansing: Other: soap and water, Topical Anesthetic Applied: Other: lidocaine 4%, Treatment Notes Wound #2 (Left, Distal Lower Leg) 1. Cleansed with: Clean wound with Normal Saline 2. Anesthetic Topical Lidocaine 4% cream to wound bed prior to debridement 4. Dressing Applied: Aquacel Ag 5. Secondary Dressing Applied Bordered Foam Dressing 7. Secured with Patient to wear own compression  stockings ESSIE, LAGUNES ([id number]) Electronic Signature(s) Signed: 04/08/2015 9:45:51 AM By: Kristin Cool RN, [name], [name] Previous Signature: 04/08/2015 9:45:31 AM Version By: Kristin Cool RN, [name], [name] Previous Signature: 04/04/2015 4:53:24 PM Version By: Kristin Cool RN, [name], [name] Entered By: Kristin Cool, RN, [name], Kristin on 04/08/2015 09:45:50 Fernando, [name] ([id number]) -------------------------------------------------------------------------------- Vitals Details Patient Name: JULISA, FLIPPO [name] Date of Service: 04/03/2015 1:00 PM Medical Record Number: [id number] Patient Account Number: [id number] Date of Birth/Sex: 1946/03/22 (68 y.o. Female) Treating RN: [name] Primary Care Physician: [name] Other Clinician: Referring Physician: Glendon [name] Treating Physician/Extender: [name] in Treatment: 8 Vital Signs Time Taken: 13:10 Temperature (F): 98.4 Height (in): 65 Pulse (bpm): 80 Weight (lbs): 248 Respiratory Rate (breaths/min): 18 Body Mass Index (BMI): 41.3 Blood Pressure (mmHg): 128/58 Reference Range: 80 - 120 mg / dl Electronic Signature(s) Signed: 04/04/2015 4:53:24 PM By: [name] Entered By: Kristin Cool, RN, [name], Kristin on 04/03/2015 13:11:22

## 2015-04-14 ENCOUNTER — Encounter: Payer: PPO | Admitting: Surgery

## 2015-04-14 DIAGNOSIS — L97322 Non-pressure chronic ulcer of left ankle with fat layer exposed: Secondary | ICD-10-CM | POA: Diagnosis not present

## 2015-04-14 NOTE — Progress Notes (Signed)
[name], [name] ([id number]) Visit Report for 04/14/2015 Chief Complaint Document Details Patient Name: [name], [name] 04/14/2015 3:15 Date of Service: PM Medical Record [id number] Number: Patient Account Number: [id number] [birth year] (69 y.o. Treating RN: Date of Birth/Sex: Female) Other Clinician: Primary Care Physician: [facility], [name] Treating [name] Referring Physician: Glendon Axe Physician/Extender: Weeks in Treatment: 10 Information Obtained from: Patient Chief Complaint Patient presents to the wound care center for a consult due non healing wound 69 year old patient comes with a history of having a ulcer on the left lower extremity for the past 4 weeks. she says she's had swelling of both lower extremities for about a year after she started having prednisone. 02/07/2015 -- her vascular appointments obtained were in the first and third week of June. she is able to go to [facility] and we will try and get her some earlier appointments. Other than that nothing else has changed in her management. Electronic Signature(s) Signed: 04/14/2015 4:25:54 PM By: [name], [name] Entered By: [name] on 04/14/2015 15:51:08 [name], [name] ([id number]) -------------------------------------------------------------------------------- HPI Details Patient Name: [name], [name] 04/14/2015 3:15 Date of Service: PM Medical Record [id number] Number: Patient Account Number: [id number] 03/16/1946 (69 y.o. Treating RN: Date of Birth/Sex: Female) Other Clinician: Primary Care Physician: [facility], [name] Treating [name] Referring Physician: Glendon Axe Physician/Extender: Weeks in Treatment: 10 History of Present Illness HPI Description: 69 year old patient who is known to have diabetes mellitus type 2, chronic renal insufficiency, coronary artery disease, hypertension, hypercholesterolemia, temporal arteritis and inflammatory arthritiss also has a history of having  a hysterectomy and some orthopedic related surgeries. The ulcer on the left lower extremity started off as a blister and then. Got progressively worse. She does not have any fever or chills and has not had any recent surgical intervention for this. Her last hemoglobin A1c was 10.1 in September 2015. She has been recently put on doxycycline by her PCP. She is now also allergic to doxycycline and was this was changed over to Keflex. 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 She does see a cardiologist and also takes a diuretic. 02/07/2015 her arterial and venous duplex studies to be done have dates been given as the first and third week of June. This is at [facility]. We are going to try and get early appointments at [facility]. other than that nothing has changed in her management. 02/14/2015 -- we have been able to get her an appointment in [facility] on May 20 which is much earlier than her previous ones at [place]. She continues with her prednisone and her sugars are in the range of 150-200. 02/21/2015 We were able to get a vascular lab workup for her today and she is going to be there at 2:00 this afternoon. the swelling of her leg has gone down significantly but she still has some tenderness over the wounds. 02/28/2015 - She has had one of two vascular workups done, and this coming Tuesday has another, at [facility]. She continues to be on steroid medications. She has significant sensitivity in her left lower extremity and has pain suggestive of neuropathic pain and I have asked her to address this with her primary care physician. 03/07/2015 -- The patient saw Dr. 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 Official report  is awaited. Addendum: Official reports are now available and on 03/04/2015. She was seen and lower extremity venous duplex exam was done. There was reflux present within the left greater saphenous vein below the knee and also the left small saphenous vein. Arterial duplex showed normal triphasic waveforms throughout the left lower extremity without any significant stenosis. Her ABIs were noncompressible bilaterally but a waveforms were normal and a digital pressures were normal bilaterally consistent with no significant arterial insufficiency. He has recommended endovenous ablation of both the left small saphenous and the left great saphenous vein. This would still be scheduled later. 03/14/2015 -- she has heard back from the vascular office and has surgery scheduled for sometime in July. [name], [name] ([id number]) Her rheumatologist has decreased her prednisone dosage but she still on it. She has also had cataract surgery in her right eye recently this week. 03/20/2015 - No new complaints today. Pain improved. No fever or chills. Tolerating 2 layer compression. 04/14/2015 -- she was doing very well today she went off on vacation and now her edema has increased markedly the ulceration is bigger and her diabetes is not under control. Electronic Signature(s) Signed: 04/14/2015 4:25:54 PM By: [name], [name] Entered By: [name] on 04/14/2015 15:52:31 [name], [name] ([id number]) -------------------------------------------------------------------------------- Physical Exam Details Patient Name: [name], [name] 04/14/2015 3:15 Date of Service: PM Medical Record [id number] Number: Patient Account Number: [id number] 02-21-1946 (68 y.o. Treating RN: Date of Birth/Sex: Female) Other Clinician: Primary Care Physician: [facility], [name] Treating [name] Referring Physician: Glendon Axe Physician/Extender: Weeks in Treatment: 10 Constitutional . Pulse regular. Respirations  normal and unlabored. Afebrile. . Eyes Nonicteric. Reactive to light. Ears, Nose, Mouth, and Throat Lips, teeth, and gums WNL.Marland Kitchen Moist mucosa without lesions . Neck supple and nontender. No palpable supraclavicular or cervical adenopathy. Normal sized without goiter. Respiratory WNL. No retractions.. Cardiovascular Pedal Pulses WNL. she has significant edema of the left lower extremity and it is +2 pitting.. Musculoskeletal Adexa without tenderness or enlargement.. Digits and nails w/o clubbing, cyanosis, infection, petechiae, ischemia, or inflammatory conditions.. Integumentary (Hair, Skin) the ulceration on the left lower extremity has increased in size and the eschar is quite dense and tender.. No crepitus or fluctuance. No peri-wound warmth or erythema. No masses.Marland Kitchen Psychiatric Judgement and insight Intact.. No evidence of depression, anxiety, or agitation.. Electronic Signature(s) Signed: 04/14/2015 4:25:54 PM By: [name], [name] Entered By: [name] on 04/14/2015 16:13:06 [name], [name] ([id number]) -------------------------------------------------------------------------------- Physician Orders Details Patient Name: [name], [name] 04/14/2015 3:15 Date of Service: PM Medical Record [id number] Number: Patient Account Number: [id number] Apr 13, 1946 (69 y.o. Treating RN: [name] Date of Birth/Sex: Female) Other Clinician: Primary Care Physician: [facility], Delana Meyer Treating Treyon Wymore Referring Physician: Glendon Axe Physician/Extender: Weeks in Treatment: 10 Verbal / Phone Orders: Yes Clinician: [name] Read Back and Verified: Yes Diagnosis Coding Wound Cleansing Wound #2 Left,Distal Lower Leg o Cleanse wound with mild soap and water o Cleanse wound with mild soap and water Anesthetic Wound #2 Left,Distal Lower Leg o Topical Lidocaine 4% cream applied to wound bed prior to debridement Primary Wound Dressing Wound #2 Left,Distal Lower  Leg o Aquacel Ag Secondary Dressing Wound #2 Left,Distal Lower Leg o ABD pad Dressing Change Frequency Wound #2 Left,Distal Lower Leg o Change dressing every week Follow-up Appointments Wound #2 Left,Distal Lower Leg o Return Appointment in 1 week. Edema Control Wound #2 Left,Distal Lower Leg o 2  Layer Compression System - Left Lower Extremity Electronic Signature(s) Signed: 04/14/2015 4:25:54 PM By: [name], [name] Signed: 04/14/2015 5:02:38 PM By: Dorthey Sawyer, Vaughan Basta [name] ([id number]) Entered By: [name] on 04/14/2015 15:34:07 [name], [name] ([id number]) -------------------------------------------------------------------------------- Problem List Details Patient Name: ANISIA, LEIJA 04/14/2015 3:15 Date of Service: PM Medical Record [id number] Number: Patient Account Number: [id number] 1945/11/06 (69 y.o. 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 04/14/2015 3:15 Date of Service: PM Medical Record [id number] Number: Patient Account Number: [id number] 1945-11-15 (69 y.o. Treating RN: Date of Birth/Sex: Female) Other Clinician: Primary Care Physician: [facility], [name] Treating [name] Referring Physician: Glendon Axe Physician/Extender: Weeks in Treatment: 10 Subjective Chief Complaint Information obtained from Patient Patient presents to the wound care center for a consult due non healing wound 69 year old patient comes with a history of having a ulcer on the left lower extremity for the past 4 weeks. she says she's had swelling of both lower extremities for about a year after she started having prednisone. 02/07/2015 -- her vascular appointments obtained were in the first and third week of June. she is able to go to [place] and we will try and get her some earlier appointments. Other than that nothing else has changed in her management. History of Present Illness (HPI) 69 year old patient who is known to have diabetes mellitus type 2, chronic renal insufficiency, coronary artery disease, hypertension, hypercholesterolemia, temporal arteritis and inflammatory arthritiss also has a history of having a hysterectomy and some orthopedic related surgeries. The ulcer on the left lower extremity started off as a blister and then. Got progressively worse. She does not have any fever or chills and has not had any recent surgical intervention for this. Her last hemoglobin A1c was 10.1 in September 2015. She has been recently put on doxycycline by her PCP. She is now also allergic to doxycycline and was this was changed over to Keflex. 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 She does see a cardiologist and also takes a diuretic. 02/07/2015 her arterial and venous duplex studies to be  done have dates been given as the first and third week of June. This is at [facility]. We are going to try and get early appointments at [facility]. other than that nothing has changed in her management. 02/14/2015 -- we have been able to get her an appointment in [facility] on May 20 which is much earlier than her previous ones at [place]. She continues with her prednisone and her sugars are in the range of 150-200. 02/21/2015 We were able to get a vascular lab workup for  her today and she is going to be there at 2:00 this afternoon. the swelling of her leg has gone down significantly but she still has some tenderness over the wounds. 02/28/2015 - She has had one of two vascular workups done, and this coming Tuesday has another, at [facility]. She continues to be on steroid medications. She has significant sensitivity in her left lower extremity and has pain suggestive of neuropathic pain and I have asked her to address this with her primary care physician. 03/07/2015 -- The patient saw Dr. Lucky Cowboy for a consultation and he has had her arteries are okay but she has 2 incompetent veins on the left lower extremity and he is going to set her up for surgery. Official report is [name], FRANTA ([id number]) awaited. Addendum: Official reports are now available and on 03/04/2015. She was seen and lower extremity venous duplex exam was done. There was reflux present within the left greater saphenous vein below the knee and also the left small saphenous vein. Arterial duplex showed normal triphasic waveforms throughout the left lower extremity without any significant stenosis. Her ABIs were noncompressible bilaterally but a waveforms were normal and a digital pressures were normal bilaterally consistent with no significant arterial insufficiency. He has recommended endovenous ablation of both the left small saphenous and the left great saphenous vein. This would still be  scheduled later. 03/14/2015 -- she has heard back from the vascular office and has surgery scheduled for sometime in July. Her rheumatologist has decreased her prednisone dosage but she still on it. She has also had cataract surgery in her right eye recently this week. 03/20/2015 - No new complaints today. Pain improved. No fever or chills. Tolerating 2 layer compression. 04/14/2015 -- she was doing very well today she went off on vacation and now her edema has increased markedly the ulceration is bigger and her diabetes is not under control. Objective Constitutional Pulse regular. Respirations normal and unlabored. Afebrile. Vitals Time Taken: 3:15 PM, Height: 65 in, Weight: 248 lbs, BMI: 41.3, Temperature: 97.6 F, Pulse: 81 bpm, Respiratory Rate: 20 breaths/min, Blood Pressure: 141/52 mmHg. Eyes Nonicteric. Reactive to light. Ears, Nose, Mouth, and Throat Lips, teeth, and gums WNL.Marland Kitchen Moist mucosa without lesions . Neck supple and nontender. No palpable supraclavicular or cervical adenopathy. Normal sized without goiter. Respiratory WNL. No retractions.. Cardiovascular Pedal Pulses WNL. she has significant edema of the left lower extremity and it is +2 pitting.. Musculoskeletal Adexa without tenderness or enlargement.. Digits and nails w/o clubbing, cyanosis, infection, petechiae, ischemia, or inflammatory conditions.Marland Kitchen CYERA, BALBONI ([id number]) Psychiatric Judgement and insight Intact.. No evidence of depression, anxiety, or agitation.. Integumentary (Hair, Skin) the ulceration on the left lower extremity has increased in size and the eschar is quite dense and tender.. No crepitus or fluctuance. No peri-wound warmth or erythema. No masses.. Wound #2 status is Open. Original cause of wound was Blister. The wound is located on the Left,Distal Lower Leg. The wound measures 3.9cm length x 2.8cm width x 0.4cm depth; 8.577cm^2 area and 3.431cm^3 volume. The wound is limited to skin  breakdown. There is no tunneling or undermining noted. There is a medium amount of serosanguineous drainage noted. The wound margin is distinct with the outline attached to the wound base. There is no granulation within the wound bed. There is a large (67- 100%) amount of necrotic tissue within the wound bed including Eschar and Adherent Slough. The periwound skin appearance exhibited: Dry/Scaly. The periwound skin appearance did not  exhibit: Callus, Crepitus, Excoriation, Fluctuance, Friable, Induration, Localized Edema, Rash, Scarring, Maceration, Moist, Atrophie Blanche, Cyanosis, Ecchymosis, Hemosiderin Staining, Mottled, Pallor, Rubor, Erythema. Periwound temperature was noted as No Abnormality. The periwound has tenderness on palpation. 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 I will put in a call to her PCP today so that we can refer her to a general surgeon for debridement and punch biopsies of this ulceration. These reports she can see her rheumatologist to see if there is any other workup to be done. The patient will continue with silver alginate and a 2 layer compression wrap. ALISHIA, LEBO ([id number]) She is agreeable about the treatment plan and will follow-up  appropriately. Plan Wound Cleansing: Wound #2 Left,Distal Lower Leg: Cleanse wound with mild soap and water Cleanse wound with mild soap and water Anesthetic: Wound #2 Left,Distal Lower Leg: Topical Lidocaine 4% cream applied to wound bed prior to debridement Primary Wound Dressing: Wound #2 Left,Distal Lower Leg: Aquacel Ag Secondary Dressing: Wound #2 Left,Distal Lower Leg: ABD pad Dressing Change Frequency: Wound #2 Left,Distal Lower Leg: Change dressing every week Follow-up Appointments: Wound #2 Left,Distal Lower Leg: Return Appointment in 1 week. Edema Control: Wound #2 Left,Distal Lower Leg: 2 Layer Compression System - Left Lower Extremity The patient is scheduled for venous ablation sometime in July but that it has not been fixed yet. 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 I will put in a call to her PCP today so that we can refer her to a general surgeon for debridement and punch biopsies of this ulceration. These reports she can see her rheumatologist to see if there is any other workup to be done. The patient will continue with silver alginate and a 2 layer compression wrap. She is agreeable about the treatment plan and will follow-up appropriately. Electronic Signature(s) Signed: 04/14/2015 4:25:54 PM By: [name], [name] JINI, HORIUCHI [name] ([id number]) Entered By: [name] on 04/14/2015 16:15:35 [place], Maritssa [name] ([id number]) -------------------------------------------------------------------------------- SuperBill Details Patient Name: LATORIE, MONTESANO [name] Date of Service: 04/14/2015 Medical Record Number: [id number] Patient Account Number: [id number] Date of Birth/Sex: 01-21-1946 (68 y.o. 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 I83.223 Varicose veins of left  lower extremity with both ulcer of ankle and inflammation Facility Procedures CPT4: Description Modifier Quantity Code [id number] (Facility Use Only) [phone] - [place] LWR LT 1 LEG Physician Procedures CPT4: Description Modifier Quantity Code [id number] - WC PHYS LEVEL 3 - EST PT 1 ICD-10 Description Diagnosis E11.622 Type 2 diabetes mellitus with other skin ulcer L97.322 Non-pressure chronic ulcer of left ankle with fat layer exposed I83.222  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

## 2015-04-14 NOTE — Progress Notes (Signed)
[name] ([id number]) Visit Report for 04/14/2015 Arrival Information Details Patient Name: [name], [name] Date of Service: 04/14/2015 3:15 PM Medical Record Number: [id number] Patient Account Number: [id number] Date of Birth/Sex: 1946-05-28 (68 y.o. Female) Treating RN: Junious Dresser Primary Care Physician: Glendon Axe Other Clinician: Referring Physician: Glendon Axe Treating Physician/Extender: Frann Rider in Treatment: 10 Visit Information History Since Last Visit Added or deleted any medications: No Patient Arrived: Cane Any new allergies or adverse reactions: No Arrival Time: 15:17 Had a fall or experienced change in No Accompanied By: granddaughter activities of daily living that may affect Transfer Assistance: None risk of falls: Patient Identification Verified: Yes Signs or symptoms of abuse/neglect since last No Secondary Verification Yes visito Process Completed: Hospitalized since last visit: No Patient Has Alerts: Yes Has Dressing in Place as Prescribed: Yes Patient Alerts: Patient on Blood Has Compression in Place as Prescribed: No Thinner Pain Present Now: Yes Electronic Signature(s) Signed: 04/14/2015 4:12:08 PM By: Junious Dresser RN Entered By: Junious Dresser on 04/14/2015 15:17:55 [name], [name] ([id number]) -------------------------------------------------------------------------------- Encounter Discharge Information Details Patient Name: [name], [name] Date of Service: 04/14/2015 3:15 PM Medical Record Number: [id number] Patient Account Number: [id number] Date of Birth/Sex: Apr 07, 1946 (68 y.o. Female) Treating RN: Primary Care Physician: Glendon Axe Other Clinician: Referring Physician: Glendon Axe Treating Physician/Extender: Frann Rider in Treatment: 10 Encounter Discharge Information Items Schedule Follow-up Appointment: No Medication Reconciliation completed No and provided to Patient/Care [name]: Provided  on Clinical Summary of Care: 04/14/2015 Form Type Recipient Paper Patient LM Electronic Signature(s) Signed: 04/14/2015 3:48:42 PM By: Ruthine Dose Entered By: Ruthine Dose on 04/14/2015 15:48:42 [name], [name] ([id number]) -------------------------------------------------------------------------------- Lower Extremity Assessment Details Patient Name: [name], [name] Date of Service: 04/14/2015 3:15 PM Medical Record Number: [id number] Patient Account Number: [id number] Date of Birth/Sex: 08/07/1946 (68 y.o. Female) Treating RN: Junious Dresser Primary Care Physician: Glendon Axe Other Clinician: Referring Physician: Glendon Axe Treating Physician/Extender: Frann Rider in Treatment: 10 Edema Assessment Assessed: [Left: Yes] [Right: No] Edema: [Left: Ye] [Right: s] Calf Left: Right: Point of Measurement: 33 cm From Medial Instep 38 cm cm Ankle Left: Right: Point of Measurement: 12 cm From Medial Instep 23.9 cm cm Vascular Assessment Claudication: Claudication Assessment [Left:None] Pulses: Posterior Tibial Palpable: [Left:Yes] Dorsalis Pedis Palpable: [Left:Yes] Extremity colors, hair growth, and conditions: Extremity Color: [Left:Normal] Hair Growth on Extremity: [Left:Yes] Temperature of Extremity: [Left:Warm] Capillary Refill: [Left:< 3 seconds] Dependent Rubor: [Left:No] Blanched when Elevated: [Left:No] Toe Nail Assessment Left: Right: Thick: No Discolored: No Deformed: No Improper Length and Hygiene: No [name], [name] ([id number]) Electronic Signature(s) Signed: 04/14/2015 4:12:08 PM By: Junious Dresser RN Entered By: Junious Dresser on 04/14/2015 15:22:43 [name], [name] ([id number]) -------------------------------------------------------------------------------- Multi Wound Chart Details Patient Name: Kristin Stare D. Date of Service: 04/14/2015 3:15 PM Medical Record Number: [id number] Patient Account Number: [id number] Date of Birth/Sex:  February 07, 1946 (68 y.o. Female) Treating RN: [name] Primary Care Physician: Glendon Axe Other Clinician: Referring Physician: Glendon Axe Treating Physician/Extender: Frann Rider in Treatment: 10 Vital Signs Height(in): 65 Pulse(bpm): 81 Weight(lbs): 248 Blood Pressure 141/52 (mmHg): Body Mass Index(BMI): 41 Temperature(F): 97.6 Respiratory Rate 20 (breaths/min): Photos: [2:No Photos] [N/A:N/A] Wound Location: [2:Left Lower Leg - Distal] [N/A:N/A] Wounding Event: [2:Blister] [N/A:N/A] Primary Etiology: [2:Venous Leg Ulcer] [N/A:N/A] Comorbid History: [2:Cataracts, Asthma, Coronary Artery Disease, Hypertension, Type II Diabetes, Osteoarthritis, Neuropathy] [N/A:N/A] Date Acquired: [2:12/30/2014] [N/A:N/A] Weeks of Treatment: [2:10] [N/A:N/A] Wound Status: [2:Open] [N/A:N/A] Clustered Wound: [2:Yes] [N/A:N/A] Measurements L x W x D  3.9x2.8x0.4 [N/A:N/A] (cm) Area (cm) : [2:8.577] [N/A:N/A] Volume (cm) : [2:3.431] [N/A:N/A] % Reduction in Area: [2:-1113.20%] [N/A:N/A] % Reduction in Volume: -4732.40% [N/A:N/A] Classification: [2:Full Thickness Without Exposed Support Structures] [N/A:N/A] HBO Classification: [2:Grade 1] [N/A:N/A] Exudate Amount: [2:Medium] [N/A:N/A] Exudate Type: [2:Serosanguineous] [N/A:N/A] Exudate Color: [2:red, brown] [N/A:N/A] Wound Margin: [2:Distinct, outline attached] [N/A:N/A] Granulation Amount: [2:None Present (0%)] [N/A:N/A] Necrotic Amount: [2:Large (67-100%)] [N/A:N/A] Necrotic Tissue: [2:Eschar, Adherent Slough] [N/A:N/A] Exposed Structures: Fascia: No N/A N/A Fat: No Tendon: No Muscle: No Joint: No Bone: No Limited to Skin Breakdown Epithelialization: None N/A N/A Periwound Skin Texture: Edema: No N/A N/A Excoriation: No Induration: No Callus: No Crepitus: No Fluctuance: No Friable: No Rash: No Scarring: No Periwound Skin Dry/Scaly: Yes N/A N/A Moisture: Maceration: No Moist: No Periwound Skin Color:  Atrophie Blanche: No N/A N/A Cyanosis: No Ecchymosis: No Erythema: No Hemosiderin Staining: No Mottled: No Pallor: No Rubor: No Temperature: No Abnormality N/A N/A Tenderness on Yes N/A N/A Palpation: Wound Preparation: Ulcer Cleansing: Other: N/A N/A soap and water Topical Anesthetic Applied: Other: lidocaine 4% Treatment Notes Electronic Signature(s) Signed: 04/14/2015 5:02:38 PM By: [name] Entered By: [name] on 04/14/2015 15:33:37 [name], [name] ([id number]) -------------------------------------------------------------------------------- [place] Details Patient Name: CIARRA, BRADDY D. Date of Service: 04/14/2015 3:15 PM Medical Record Number: [id number] Patient Account Number: [id number] Date of Birth/Sex: [birth year] (68 y.o. Female) Treating RN: [name] Primary Care Physician: Glendon Axe Other Clinician: Referring Physician: Glendon Axe Treating Physician/Extender: Frann Rider in Treatment: 10 Active Inactive Orientation to the Wound Care Program Nursing Diagnoses: Knowledge deficit related to the wound healing center program Goals: Patient/caregiver will verbalize understanding of the [place] Program Date Initiated: 01/31/2015 Goal Status: Active Interventions: Provide education on orientation to the wound center Notes: Venous Leg Ulcer Nursing Diagnoses: Potential for venous Insuffiency (use before diagnosis confirmed) Goals: Non-invasive venous studies are completed as ordered Date Initiated: 01/31/2015 Goal Status: Active Patient/caregiver will verbalize understanding of disease process and disease management Date Initiated: 01/31/2015 Goal Status: Active Interventions: Assess peripheral edema status every visit. Notes: Wound/Skin Impairment Nursing Diagnoses: Impaired tissue integrity Knowledge deficit related to smoking impact on wound healing [name], [name]  ([id number]) Goals: Patient/caregiver will verbalize understanding of skin care regimen Date Initiated: 01/31/2015 Goal Status: Active Ulcer/skin breakdown will heal within 14 weeks Date Initiated: 01/31/2015 Goal Status: Active Interventions: Assess ulceration(s) every visit Notes: Electronic Signature(s) Signed: 04/14/2015 5:02:38 PM By: [name] Entered By: [name] on 04/14/2015 15:32:51 [name], [name] ([id number]) -------------------------------------------------------------------------------- Pain Assessment Details Patient Name: Kristin Stare D. Date of Service: 04/14/2015 3:15 PM Medical Record Number: [id number] Patient Account Number: [id number] Date of Birth/Sex: 10-29-1945 (68 y.o. Female) Treating RN: Junious Dresser Primary Care Physician: Glendon Axe Other Clinician: Referring Physician: Glendon Axe Treating Physician/Extender: Frann Rider in Treatment: 10 Active Problems Location of Pain Severity and Description of Pain Patient Has Paino Yes Site Locations Pain Location: Pain in Ulcers With Dressing Change: Yes Rate the pain. Current Pain Level: 0 Worst Pain Level: 10 Least Pain Level: 0 Pain Management and Medication Current Pain Management: Electronic Signature(s) Signed: 04/14/2015 4:12:08 PM By: Junious Dresser RN Entered By: Junious Dresser on 04/14/2015 15:19:04 [name], [name] ([id number]) -------------------------------------------------------------------------------- Patient/Caregiver Education Details Patient Name: Kristin Stare D. Date of Service: 04/14/2015 3:15 PM Medical Record Number: [id number] Patient Account Number: [id number] Date of Birth/Gender: 11-23-1945 (68 y.o. Female) Treating RN: [name] Primary Care Physician: Glendon Axe Other Clinician: Referring Physician: Glendon Axe Treating Physician/Extender: Con Memos,  Quincy Simmonds in Treatment: 10 Education Assessment Education Provided To: Patient Education  Topics Provided Wound/Skin Impairment: Handouts: Other: plan for surgical debridement Methods: Explain/Verbal Responses: State content correctly Electronic Signature(s) Signed: 04/14/2015 5:02:38 PM By: [name] Entered By: [name] on 04/14/2015 15:38:27 [name], [name] ([id number]) -------------------------------------------------------------------------------- Wound Assessment Details Patient Name: [name], [name] Date of Service: 04/14/2015 3:15 PM Medical Record Number: [id number] Patient Account Number: [id number] Date of Birth/Sex: 12-07-1945 (68 y.o. Female) Treating RN: Junious Dresser Primary Care Physician: Glendon Axe Other Clinician: Referring Physician: Glendon Axe Treating Physician/Extender: Frann Rider in Treatment: 10 Wound Status Wound Number: 2 Primary Venous Leg Ulcer Etiology: Wound Location: Left Lower Leg - Distal Wound Open Wounding Event: Blister Status: Date Acquired: 12/30/2014 Comorbid Cataracts, Asthma, Coronary Artery Weeks Of Treatment: 10 History: Disease, Hypertension, Type II Clustered Wound: Yes Diabetes, Osteoarthritis, Neuropathy Photos Photo Uploaded By: Junious Dresser on 04/14/2015 16:09:40 Wound Measurements Length: (cm) 3.9 Width: (cm) 2.8 Depth: (cm) 0.4 Area: (cm) 8.577 Volume: (cm) 3.431 % Reduction in Area: -1113.2% % Reduction in Volume: -4732.4% Epithelialization: None Tunneling: No Undermining: No Wound Description Full Thickness Without Exposed Foul Odor A Classification: Support Structures Diabetic Severity Grade 1 (Wagner): Wound Margin: Distinct, outline attached Exudate Amount: Medium Exudate Type: Serosanguineous Exudate Color: red, brown fter Cleansing: No Wound Bed Granulation Amount: None Present (0%) Exposed Structure [name], Angeleen D. ([id number]) Necrotic Amount: Large (67-100%) Fascia Exposed: No Necrotic Quality: Eschar, Adherent Slough Fat Layer Exposed: No Tendon  Exposed: No Muscle Exposed: No Joint Exposed: No Bone Exposed: No Limited to Skin Breakdown Periwound Skin Texture Texture Color No Abnormalities Noted: No No Abnormalities Noted: No Callus: No Atrophie Blanche: No Crepitus: No Cyanosis: No Excoriation: No Ecchymosis: No Fluctuance: No Erythema: No Friable: No Hemosiderin Staining: No Induration: No Mottled: No Localized Edema: No Pallor: No Rash: No Rubor: No Scarring: No Temperature / Pain Moisture Temperature: No Abnormality No Abnormalities Noted: No Tenderness on Palpation: Yes Dry / Scaly: Yes Maceration: No Moist: No Wound Preparation Ulcer Cleansing: Other: soap and water, Topical Anesthetic Applied: Other: lidocaine 4%, Treatment Notes Wound #2 (Left, Distal Lower Leg) 1. Cleansed with: Clean wound with Normal Saline 2. Anesthetic Topical Lidocaine 4% cream to wound bed prior to debridement 4. Dressing Applied: Aquacel Ag 7. Secured with 2 Layer Compression System - Left Lower Extremity Electronic Signature(s) Signed: 04/14/2015 4:12:08 PM By: Junious Dresser RN Entered By: Junious Dresser on 04/14/2015 15:26:30 [place]burn, Dniyah D. ([id number]) -------------------------------------------------------------------------------- Vitals Details Patient Name: Kristin Stare D. Date of Service: 04/14/2015 3:15 PM Medical Record Number: [id number] Patient Account Number: [id number] Date of Birth/Sex: [birth year] (68 y.o. Female) Treating RN: Junious Dresser Primary Care Physician: Glendon Axe Other Clinician: Referring Physician: Glendon Axe Treating Physician/Extender: Frann Rider in Treatment: 10 Vital Signs Time Taken: 15:15 Temperature (F): 97.6 Height (in): 65 Pulse (bpm): 81 Weight (lbs): 248 Respiratory Rate (breaths/min): 20 Body Mass Index (BMI): 41.3 Blood Pressure (mmHg): 141/52 Reference Range: 80 - 120 mg / dl Electronic Signature(s) Signed: 04/14/2015 4:12:08 PM By: Junious Dresser  RN Entered By: Junious Dresser on 04/14/2015 15:19:25

## 2015-04-16 DIAGNOSIS — M5136 Other intervertebral disc degeneration, lumbar region: Secondary | ICD-10-CM | POA: Insufficient documentation

## 2015-04-16 DIAGNOSIS — M51369 Other intervertebral disc degeneration, lumbar region without mention of lumbar back pain or lower extremity pain: Secondary | ICD-10-CM | POA: Insufficient documentation

## 2015-04-17 ENCOUNTER — Encounter: Payer: Self-pay | Admitting: General Surgery

## 2015-04-17 ENCOUNTER — Ambulatory Visit (INDEPENDENT_AMBULATORY_CARE_PROVIDER_SITE_OTHER): Payer: PPO | Admitting: General Surgery

## 2015-04-17 VITALS — BP 142/78 | HR 84 | Resp 20 | Ht 65.0 in | Wt 241.0 lb

## 2015-04-17 DIAGNOSIS — I87312 Chronic venous hypertension (idiopathic) with ulcer of left lower extremity: Secondary | ICD-10-CM

## 2015-04-17 DIAGNOSIS — I83029 Varicose veins of left lower extremity with ulcer of unspecified site: Secondary | ICD-10-CM

## 2015-04-17 DIAGNOSIS — L97929 Non-pressure chronic ulcer of unspecified part of left lower leg with unspecified severity: Principal | ICD-10-CM

## 2015-04-17 NOTE — Patient Instructions (Addendum)
Follow up in 3 weeks. The patient is aware to call back for any questions or concerns. Call if pain develops in the right lower leg.

## 2015-04-17 NOTE — Progress Notes (Signed)
Patient ID: [name], female   DOB: 1946-09-17, 69 y.o.   MRN: [id number]  Chief Complaint  Patient presents with  . Other    left leg wound    HPI [name] is a 69 y.o. female.  Here today for evaluation of a left lower leg wound. She states she started prednisone for temporal aretritis one year ago. She then noticed blisters around December but would go away easily. Her leg then turned red in color at the first part of the year. She then developed ulcers that would not heal. She has been going to the wound clinic for 2 months for unna boots. She did go to Dr Lucky Cowboy in May for vascular issues. She is scheduled for laser ablation of saphenous vein July 1.   HPI  Past Medical History  Diagnosis Date  . Diabetes mellitus without complication     Type II  . Ulcer of left lower leg February 07, 2015  . Chronic renal insufficiency February 07, 2015  . Coronary artery dilation   . Hypertension   . Hyperlipidemia   . Arthritis     Inflammatory  . Thyroid disease   . Sinus problem     Past Surgical History  Procedure Laterality Date  . Abdominal hysterectomy    . Cataract extraction w/phaco Right 03/11/2015    Procedure: CATARACT EXTRACTION PHACO AND INTRAOCULAR LENS PLACEMENT (IOC);  Surgeon: Birder Robson, MD;  Location: [facility] ORS;  Service: Ophthalmology;  Laterality: Right;  [place] 00:40 AP% 24.3 CDE 9.86  . Dilation and curettage of uterus  1984  . Partial hysterectomy  1985  . Rotator cuff repair  2004  . Cyst excision      History reviewed. No pertinent family history.  Social History History  Substance Use Topics  . Smoking status: Former Smoker -- 76 years  . Smokeless tobacco: Never Used  . Alcohol Use: No    Allergies  Allergen Reactions  . Ciprofloxacin     Causes joint pain  . Doxycycline   . Levaquin [Levofloxacin]     Causes joint pain  . Penicillins Rash  . Sulfa Antibiotics Swelling and Rash    Current Outpatient Prescriptions  Medication Sig  Dispense Refill  . acetaminophen (TYLENOL) 500 MG tablet Take 500 mg by mouth every 6 (six) hours as needed.    Marland Kitchen amLODipine (NORVASC) 5 MG tablet Take 5 mg by mouth daily.    Marland Kitchen aspirin 81 MG tablet Take 81 mg by mouth daily.    Marland Kitchen atorvastatin (LIPITOR) 40 MG tablet Take 40 mg by mouth daily at 6 PM.    . Calcium Carb-Cholecalciferol (CALCIUM + D3 PO) Take by mouth daily.    . furosemide (LASIX) 20 MG tablet Take 40 mg by mouth 2 (two) times daily.    . insulin aspart (NOVOLOG) 100 UNIT/ML injection Inject into the skin 3 (three) times daily with meals.    . insulin glargine (LANTUS) 100 UNIT/ML injection Inject 50 Units into the skin at bedtime.    Marland Kitchen loratadine (CLARITIN) 10 MG tablet Take 10 mg by mouth daily.    . Magnesium 400 MG CAPS Take by mouth 2 (two) times daily.    Marland Kitchen omeprazole (PRILOSEC) 20 MG capsule Take 20 mg by mouth daily.    . potassium chloride (K-DUR) 10 MEQ tablet Take 10 mEq by mouth daily.    Marland Kitchen PREDNISOLONE PO Take 15 mg by mouth daily.     No current facility-administered medications  for this visit.    Review of Systems Review of Systems  Constitutional: Negative.   Respiratory: Negative.   Cardiovascular: Negative.   Neurological: Positive for headaches.    Blood pressure 142/78, pulse 84, resp. rate 20, height 5\' 5"  (1.651 m), weight 241 lb (109.317 kg).  Physical Exam Physical Exam  Constitutional: She is oriented to person, place, and time. She appears well-developed and well-nourished.  Cushingoid features-moon facies  HENT:  Moon face  Eyes: Conjunctivae are normal. No scleral icterus.  Cardiovascular: Normal rate, regular rhythm and normal heart sounds.   Pulses:      Dorsalis pedis pulses are 2+ on the right side, and 2+ on the left side.       Posterior tibial pulses are 2+ on the right side, and 1+ on the left side.  Moderate edema of lower legs. Irregularly shaped ulcer 7 cm long and 3 cm wide at maximum with brawny discoloration on left lower  leg above ankle. Ulcer is reasonably dry.   Pulmonary/Chest: Effort normal and breath sounds normal.  Neurological: She is alert and oriented to person, place, and time.  Skin:       Data Reviewed Notes from wound clinic Assessment   Likely stasis ulcer left leg Venous insufficiency-pt had venous Duplex and appears she has superficial venous insdufficiency. She is scheduled to have laser ablation done next week, Pt was sent her for possible biopsy to look for vasculitis. Feel it would be better to wait after laser ablation is completed- the ulcer may heal fully and biopsy may not be necessary     Plan    Pt advised I will see her in 3 weeks. Will discuss her issue with Dr. Lucky Cowboy and wound clinic.      PCP:  Tamela Gammon 04/17/2015, 5:56 PM

## 2015-04-18 DIAGNOSIS — L97322 Non-pressure chronic ulcer of left ankle with fat layer exposed: Secondary | ICD-10-CM | POA: Diagnosis not present

## 2015-04-18 NOTE — Progress Notes (Signed)
CORVETTE, ORSER ([id number]) Visit Report for 04/18/2015 Arrival Information Details Patient Name: KALEB, LINQUIST 04/18/2015 9:45 Date of Service: AM Medical Record [id number] Number: Patient Account Number: [id number] 03-24-1946 (69 y.o. Treating RN: [name] Date of Birth/Sex: Female) Other Clinician: Primary Care Physician: [facility], Delana Meyer Treating Britto, Errol Referring Physician: Glendon Axe Physician/Extender: Weeks in Treatment: 11 Visit Information History Since Last Visit Added or deleted any medications: No Patient Arrived: Cane Any new allergies or adverse reactions: No Arrival Time: 09:40 Had a fall or experienced change in No Accompanied By: self activities of daily living that may affect Transfer Assistance: None risk of falls: Patient Identification Verified: Yes Signs or symptoms of abuse/neglect since last No Secondary Verification Process Yes visito Completed: Hospitalized since last visit: No Patient Has Alerts: Yes Pain Present Now: No Patient Alerts: Patient on Blood Thinner Electronic Signature(s) Signed: 04/18/2015 10:06:36 AM By: [name] Entered By: [name] on 04/18/2015 10:06:36 Alper, Tia Masker ([id number]) -------------------------------------------------------------------------------- Encounter Discharge Information Details Patient Name: ALYDA, MEGNA D. 04/18/2015 9:45 Date of Service: AM Medical Record [id number] Number: Patient Account Number: [id number] Apr 02, 1946 (69 y.o. Treating RN: [name] Date of Birth/Sex: Female) Other Clinician: Primary Care Physician: [facility], Delana Meyer Treating [name] Referring Physician: Glendon Axe Physician/Extender: Weeks in Treatment: 11 Encounter Discharge Information Items Discharge Pain Level: 0 Discharge Condition: Stable Ambulatory Status: Cane Discharge Destination: Home Private Transportation: Auto Accompanied By: self Schedule Follow-up Appointment:  Yes Medication Reconciliation completed and No provided to Patient/Care Mostyn Varnell: Clinical Summary of Care: Electronic Signature(s) Signed: 04/18/2015 10:07:47 AM By: [name] Entered By: [name] on 04/18/2015 10:07:46 Adelsberger, Tia Masker ([id number]) -------------------------------------------------------------------------------- Patient/Caregiver Education Details Patient Name: RENE, GONSOULIN D. 04/18/2015 9:45 Date of Service: AM Medical Record [id number] Number: Patient Account Number: [id number] 01/13/1946 (69 y.o. Treating RN: [name] Date of Birth/Gender: Female) Other Clinician: Primary Care Physician: [facility], Delana Meyer Treating [name] Referring Physician: [place]ia Fields, [place]: [place] in Treatment: 11 Education Assessment Education Provided To: Patient Education Topics Provided Venous: Handouts: Controlling Swelling with Multilayered Compression Wraps Methods: Demonstration, Explain/Verbal Responses: State content correctly Electronic Signature(s) Signed: 04/18/2015 10:07:27 AM By: [name] Entered By: [name] on 04/18/2015 10:07:27

## 2015-04-21 ENCOUNTER — Encounter: Payer: PPO | Admitting: Surgery

## 2015-04-21 DIAGNOSIS — L97322 Non-pressure chronic ulcer of left ankle with fat layer exposed: Secondary | ICD-10-CM | POA: Diagnosis not present

## 2015-04-22 NOTE — Progress Notes (Signed)
ANALIE, KATZMAN ([id number]) Visit Report for 04/21/2015 Arrival Information Details Patient Name: [name], [name] Date of Service: 04/21/2015 2:30 PM Medical Record Number: [id number] Patient Account Number: [id number] Date of Birth/Sex: 14-Apr-1946 (68 y.o. Female) Treating RN: [name] Primary Care Physician: Glendon Axe Other Clinician: Referring Physician: Glendon Axe Treating Physician/Extender: Frann Rider in Treatment: 11 Visit Information History Since Last Visit Added or deleted any medications: No Patient Arrived: Ambulatory Any new allergies or adverse reactions: No Arrival Time: 14:35 Had a fall or experienced change in No Accompanied By: daughter activities of daily living that may affect Transfer Assistance: None risk of falls: Patient Identification Verified: Yes Signs or symptoms of abuse/neglect since last No Secondary Verification Process Yes visito Completed: Hospitalized since last visit: No Patient Has Alerts: Yes Has Dressing in Place as Prescribed: Yes Patient Alerts: Patient on Blood Has Compression in Place as Prescribed: Yes Thinner Pain Present Now: Yes Electronic Signature(s) Signed: 04/21/2015 5:55:26 PM By: [name], RN, [name], [name], [name] Entered By: [name], RN, [name], Kim on 04/21/2015 14:36:37 [place], [name] ([id number]) -------------------------------------------------------------------------------- Lower Extremity Assessment Details Patient Name: [name], [name] D. Date of Service: 04/21/2015 2:30 PM Medical Record Number: [id number] Patient Account Number: [id number] Date of Birth/Sex: [birth year] (68 y.o. Female) Treating RN: [name] Primary Care Physician: Glendon Axe Other Clinician: Referring Physician: Glendon Axe Treating Physician/Extender: Frann Rider in Treatment: 11 Edema Assessment Assessed: [Left: No] [Right: No] E[Left: dema] [Right: :] Calf Left: Right: Point of Measurement: 33 cm From Medial  Instep 34.2 cm cm Ankle Left: Right: Point of Measurement: 12 cm From Medial Instep 22.5 cm cm Vascular Assessment Pulses: Posterior Tibial Dorsalis Pedis Palpable: [Left:Yes] Extremity colors, hair growth, and conditions: Extremity Color: [Left:Normal] Hair Growth on Extremity: [Left:Yes] Temperature of Extremity: [Left:Warm] Capillary Refill: [Left:< 3 seconds] Toe Nail Assessment Left: Right: Thick: No Discolored: No Deformed: No Improper Length and Hygiene: No Electronic Signature(s) Signed: 04/21/2015 5:55:26 PM By: [name], RN, [name], [name], [name] Entered By: [name], RN, [name], Kim on 04/21/2015 14:40:40 Jorstad, Jelisha D. ([id number]) -------------------------------------------------------------------------------- Multi Wound Chart Details Patient Name: [name] D. Date of Service: 04/21/2015 2:30 PM Medical Record Number: [id number] Patient Account Number: [id number] Date of Birth/Sex: [birth year] (68 y.o. Female) Treating RN: [name] Primary Care Physician: Glendon Axe Other Clinician: Referring Physician: Glendon Axe Treating Physician/Extender: Frann Rider in Treatment: 11 Vital Signs Height(in): 65 Pulse(bpm): 82 Weight(lbs): 248 Blood Pressure 117/65 (mmHg): Body Mass Index(BMI): 41 Temperature(F): 97.5 Respiratory Rate 18 (breaths/min): Photos: [2:No Photos] [N/A:N/A] Wound Location: [2:Left Lower Leg - Distal] [N/A:N/A] Wounding Event: [2:Blister] [N/A:N/A] Primary Etiology: [2:Venous Leg Ulcer] [N/A:N/A] Comorbid History: [2:Cataracts, Asthma, Coronary Artery Disease, Hypertension, Type II Diabetes, Osteoarthritis, Neuropathy] [N/A:N/A] Date Acquired: [2:12/30/2014] [N/A:N/A] Weeks of Treatment: [2:11] [N/A:N/A] Wound Status: [2:Open] [N/A:N/A] Clustered Wound: [2:Yes] [N/A:N/A] Measurements L x W x D 3.8x3x0.4 [N/A:N/A] (cm) Area (cm) : [2:8.954] [N/A:N/A] Volume (cm) : [2:3.581] [N/A:N/A] % Reduction in Area: [2:-1166.50%]  [N/A:N/A] % Reduction in Volume: -4943.70% [N/A:N/A] Classification: [2:Full Thickness Without Exposed Support Structures] [N/A:N/A] HBO Classification: [2:Grade 1] [N/A:N/A] Exudate Amount: [2:Medium] [N/A:N/A] Exudate Type: [2:Serosanguineous] [N/A:N/A] Exudate Color: [2:red, brown] [N/A:N/A] Wound Margin: [2:Distinct, outline attached] [N/A:N/A] Granulation Amount: [2:None Present (0%)] [N/A:N/A] Necrotic Amount: [2:Large (67-100%)] [N/A:N/A] Necrotic Tissue: [2:Eschar, Adherent Slough] [N/A:N/A] Exposed Structures: Fascia: No N/A N/A Fat: No Tendon: No Muscle: No Joint: No Bone: No Limited to Skin Breakdown Epithelialization: None N/A N/A Periwound Skin Texture: Edema: No N/A N/A Excoriation: No Induration: No Callus:  No Crepitus: No Fluctuance: No Friable: No Rash: No Scarring: No Periwound Skin Maceration: No N/A N/A Moisture: Moist: No Dry/Scaly: No Periwound Skin Color: Hemosiderin Staining: Yes N/A N/A Atrophie Blanche: No Cyanosis: No Ecchymosis: No Erythema: No Mottled: No Pallor: No Rubor: No Temperature: No Abnormality N/A N/A Tenderness on Yes N/A N/A Palpation: Wound Preparation: Ulcer Cleansing: Other: N/A N/A soap and water Topical Anesthetic Applied: Other: lidocaine 4% Treatment Notes Electronic Signature(s) Signed: 04/21/2015 5:55:26 PM By: [name], RN, [name], [name], [name] Entered By: [name], RN, [name], Kim on 04/21/2015 14:48:27 [name], [name] ([id number]) -------------------------------------------------------------------------------- Multi-Disciplinary Care Plan Details Patient Name: [name], [name] D. Date of Service: 04/21/2015 2:30 PM Medical Record Number: [id number] Patient Account Number: [id number] Date of Birth/Sex: 1946/09/02 (68 y.o. Female) Treating RN: [name] Primary Care Physician: Glendon Axe Other Clinician: Referring Physician: Glendon Axe Treating Physician/Extender: Frann Rider in Treatment: 11 Active  Inactive Orientation to the Wound Care Program Nursing Diagnoses: Knowledge deficit related to the wound healing center program Goals: Patient/caregiver will verbalize understanding of the [place] Program Date Initiated: 01/31/2015 Goal Status: Active Interventions: Provide education on orientation to the wound center Notes: Venous Leg Ulcer Nursing Diagnoses: Potential for venous Insuffiency (use before diagnosis confirmed) Goals: Non-invasive venous studies are completed as ordered Date Initiated: 01/31/2015 Goal Status: Active Patient/caregiver will verbalize understanding of disease process and disease management Date Initiated: 01/31/2015 Goal Status: Active Interventions: Assess peripheral edema status every visit. Notes: Wound/Skin Impairment Nursing Diagnoses: Impaired tissue integrity Knowledge deficit related to smoking impact on wound healing [name], [name] D. ([id number]) Goals: Patient/caregiver will verbalize understanding of skin care regimen Date Initiated: 01/31/2015 Goal Status: Active Ulcer/skin breakdown will heal within 14 weeks Date Initiated: 01/31/2015 Goal Status: Active Interventions: Assess ulceration(s) every visit Notes: Electronic Signature(s) Signed: 04/21/2015 5:55:26 PM By: [name], RN, [name], [name], [name] Entered By: [name], RN, [name], Kim on 04/21/2015 14:48:19 [name], [name] ([id number]) -------------------------------------------------------------------------------- Pain Assessment Details Patient Name: [name], [name] D. Date of Service: 04/21/2015 2:30 PM Medical Record Number: [id number] Patient Account Number: [id number] Date of Birth/Sex: 09/12/1946 (68 y.o. Female) Treating RN: [name] Primary Care Physician: Glendon Axe Other Clinician: Referring Physician: Glendon Axe Treating Physician/Extender: Frann Rider in Treatment: 11 Active Problems Location of Pain Severity and Description of Pain Patient Has Paino  Yes Site Locations Pain Location: Generalized Pain Rate the pain. Current Pain Level: 5 Character of Pain Describe the Pain: Burning Pain Management and Medication Current Pain Management: Electronic Signature(s) Signed: 04/21/2015 5:55:26 PM By: [name], RN, [name], [name], [name] Entered By: [name], RN, [name], Kim on 04/21/2015 14:36:53 [name], [name] ([id number]) -------------------------------------------------------------------------------- Wound Assessment Details Patient Name: [name], [name] D. Date of Service: 04/21/2015 2:30 PM Medical Record Number: [id number] Patient Account Number: [id number] Date of Birth/Sex: Apr 07, 1946 (68 y.o. Female) Treating RN: [name] Primary Care Physician: Glendon Axe Other Clinician: Referring Physician: Glendon Axe Treating Physician/Extender: Frann Rider in Treatment: 11 Wound Status Wound Number: 2 Primary Venous Leg Ulcer Etiology: Wound Location: Left Lower Leg - Distal Wound Open Wounding Event: Blister Status: Date Acquired: 12/30/2014 Comorbid Cataracts, Asthma, Coronary Artery Weeks Of Treatment: 11 History: Disease, Hypertension, Type II Clustered Wound: Yes Diabetes, Osteoarthritis, Neuropathy Photos Photo Uploaded By: [name], RN, [name], Kim on 04/21/2015 17:54:34 Wound Measurements Length: (cm) 3.8 Width: (cm) 3 Depth: (cm) 0.4 Area: (cm) 8.954 Volume: (cm) 3.581 % Reduction in Area: -1166.5% % Reduction in Volume: -4943.7% Epithelialization: None Wound Description Full Thickness Without Exposed Foul Odor  A Classification: Support Structures Diabetic Severity Grade 1 (Wagner): [name], [name] ([id number]) fter Cleansing: No Wound Margin: Distinct, outline attached Exudate Amount: Medium Exudate Type: Serosanguineous Exudate Color: red, brown Wound Bed Granulation Amount: None Present (0%) Exposed Structure Necrotic Amount: Large (67-100%) Fascia Exposed: No Necrotic Quality: Eschar, Adherent  Slough Fat Layer Exposed: No Tendon Exposed: No Muscle Exposed: No Joint Exposed: No Bone Exposed: No Limited to Skin Breakdown Periwound Skin Texture Texture Color No Abnormalities Noted: No No Abnormalities Noted: No Callus: No Atrophie Blanche: No Crepitus: No Cyanosis: No Excoriation: No Ecchymosis: No Fluctuance: No Erythema: No Friable: No Hemosiderin Staining: Yes Induration: No Mottled: No Localized Edema: No Pallor: No Rash: No Rubor: No Scarring: No Temperature / Pain Moisture Temperature: No Abnormality No Abnormalities Noted: No Tenderness on Palpation: Yes Dry / Scaly: No Maceration: No Moist: No Wound Preparation Ulcer Cleansing: Other: soap and water, Topical Anesthetic Applied: Other: lidocaine 4%, Treatment Notes Wound #2 (Left, Distal Lower Leg) 1. Cleansed with: Clean wound with Normal Saline 2. Anesthetic Topical Lidocaine 4% cream to wound bed prior to debridement 4. Dressing Applied: Aquacel Ag 5. Secondary Dressing Applied [name], [name] ([id number]) ABD Pad 7. Secured with 2 Layer Compression System - Left Lower Extremity Electronic Signature(s) Signed: 04/21/2015 5:55:26 PM By: [name], RN, [name], [name], [name] Entered By: [name], RN, [name], Kim on 04/21/2015 14:44:09 [name], [name] ([id number]) -------------------------------------------------------------------------------- Vitals Details Patient Name: [name] D. Date of Service: 04/21/2015 2:30 PM Medical Record Number: [id number] Patient Account Number: [id number] Date of Birth/Sex: 09-26-1946 (68 y.o. Female) Treating RN: [name] Primary Care Physician: Glendon Axe Other Clinician: Referring Physician: Glendon Axe Treating Physician/Extender: Frann Rider in Treatment: 11 Vital Signs Time Taken: 14:36 Temperature (F): 97.5 Height (in): 65 Pulse (bpm): 82 Weight (lbs): 248 Respiratory Rate (breaths/min): 18 Body Mass Index (BMI): 41.3 Blood Pressure  (mmHg): 117/65 Reference Range: 80 - 120 mg / dl Electronic Signature(s) Signed: 04/21/2015 5:55:26 PM By: [name], RN, [name], [name], [name] Entered By: [name], RN, [name], Kim on 04/21/2015 14:37:13

## 2015-04-22 NOTE — Progress Notes (Signed)
FRANKI, ALCAIDE ([id number]) Visit Report for 04/21/2015 Chief Complaint Document Details Patient Name: [name], [name] 04/21/2015 2:30 Date of Service: PM Medical Record [id number] Number: Patient Account Number: [id number] Nov 17, 1945 (69 y.o. Treating RN: Date of Birth/Sex: Female) Other Clinician: Primary Care Physician: [facility], [name] Treating [name] Referring Physician: Glendon Axe Physician/Extender: Weeks in Treatment: 11 Information Obtained from: Patient Chief Complaint Patient presents to the wound care center for a consult due non healing wound 69 year old patient comes with a history of having a ulcer on the left lower extremity for the past 4 weeks. she says she's had swelling of both lower extremities for about a year after she started having prednisone. 02/07/2015 -- her vascular appointments obtained were in the first and third week of June. she is able to go to [place] and we will try and get her some earlier appointments. Other than that nothing else has changed in her management. Electronic Signature(s) Signed: 04/21/2015 4:47:08 PM By: [name], [name] Entered By: [name] on 04/21/2015 15:12:33 Boldin, ANTONIO WOODHAMS ([id number]) -------------------------------------------------------------------------------- Debridement Details Patient Name: [name], [name] 04/21/2015 2:30 Date of Service: PM Medical Record [id number] Number: Patient Account Number: [id number] [birth year] (69 y.o. Treating RN: Date of Birth/Sex: Female) Other Clinician: Primary Care Physician: [facility], [name] Treating Abir Eroh Referring Physician: Glendon Axe Physician/Extender: Weeks in Treatment: 11 Debridement Performed for Wound #2 Left,Distal Lower Leg Assessment: Performed By: Physician Pat Patrick., MD Debridement: Debridement Pre-procedure Yes Verification/Time Out Taken: Start Time: 14:58 Pain Control: Lidocaine 4% Topical Solution Level:  Skin/Subcutaneous Tissue Total Area Debrided (L x 3.8 (cm) x 3 (cm) = 11.4 (cm) W): Tissue and other Viable, Non-Viable, Eschar, Exudate, Fat, Fibrin/Slough, Subcutaneous material debrided: Instrument: Forceps, Scissors Bleeding: Moderate Hemostasis Achieved: Pressure End Time: 15:04 Procedural Pain: 3 Post Procedural Pain: 3 Response to Treatment: Procedure was tolerated well Post Debridement Measurements of Total Wound Length: (cm) 3.8 Width: (cm) 3 Depth: (cm) 0.5 Volume: (cm) 4.477 Electronic Signature(s) Signed: 04/21/2015 4:47:08 PM By: [name], [name] Entered By: [name] on 04/21/2015 15:12:20 [name], [name] Kitchen ([id number]) -------------------------------------------------------------------------------- HPI Details Patient Name: Kristin Stare D. 04/21/2015 2:30 Date of Service: PM Medical Record [id number] Number: Patient Account Number: [id number] April 09, 1946 (69 y.o. Treating RN: Date of Birth/Sex: Female) Other Clinician: Primary Care Physician: [facility], [name] Treating Jerrilyn Messinger Referring Physician: Glendon Axe Physician/Extender: Weeks in Treatment: 11 History of Present Illness HPI Description: 69 year old patient who is known to have diabetes mellitus type 2, chronic renal insufficiency, coronary artery disease, hypertension, hypercholesterolemia, temporal arteritis and inflammatory arthritiss also has a history of having a hysterectomy and some orthopedic related surgeries. The ulcer on the left lower extremity started off as a blister and then. Got progressively worse. She does not have any fever or chills and has not had any recent surgical intervention for this. Her last hemoglobin A1c was 10.1 in September 2015. She has been recently put on doxycycline by her PCP. She is now also allergic to doxycycline and was this was changed over to Keflex. due to her temporal arteritis she has been on prednisone for about a year and she says ever  since that she has had swelling of both lower extremities. She does see a cardiologist and also takes a diuretic. 02/07/2015 her arterial and venous duplex studies to be done have dates been given as the first and third week of June. This is at [facility] We are going to try and get early appointments at [facility]. other than that  nothing has changed in her management. 02/14/2015 -- we have been able to get her an appointment in [facility] on May 20 which is much earlier than her previous ones at [place]. She continues with her prednisone and her sugars are in the range of 150-200. 02/21/2015 We were able to get a vascular lab workup for her today and she is going to be there at 2:00 this afternoon. the swelling of her leg has gone down significantly but she still has some tenderness over the wounds. 02/28/2015 - She has had one of two vascular workups done, and this coming Tuesday has another, at [place] [facility]. She continues to be on steroid medications. She has significant sensitivity in her left lower extremity and has pain suggestive of neuropathic pain and I have asked her to address this with her primary care physician. 03/07/2015 -- The patient saw Dr. Lucky Cowboy for a consultation and he has had her arteries are okay but she has 2 incompetent veins on the left lower extremity and he is going to set her up for surgery. Official report is awaited. Addendum: Official reports are now available and on 03/04/2015. She was seen and lower extremity venous duplex exam was done. There was reflux present within the left greater saphenous vein below the knee and also the left small saphenous vein. Arterial duplex showed normal triphasic waveforms throughout the left lower extremity without any significant stenosis. Her ABIs were noncompressible bilaterally but a waveforms were normal and a digital pressures were normal bilaterally consistent with no significant  arterial insufficiency. He has recommended endovenous ablation of both the left small saphenous and the left great saphenous vein. This would still be scheduled later. 03/14/2015 -- she has heard back from the vascular office and has surgery scheduled for sometime in July. [name], [name] ([id number]) Her rheumatologist has decreased her prednisone dosage but she still on it. She has also had cataract surgery in her right eye recently this week. 03/20/2015 - No new complaints today. Pain improved. No fever or chills. Tolerating 2 layer compression. 04/14/2015 -- she was doing very well today she went off on vacation and now her edema has increased markedly the ulceration is bigger and her diabetes is not under control. 04/21/2015 -- I spoke to her PCP Dr. Candiss Norse and discussed the management which would include being seen by a general surgeon for debridement and taking multiple punch biopsies which would help in establishing the diagnosis of this is a vasculitis. She is agreeable about this and will set her up for the procedure with Dr. Tamala Julian at [facility]. She was seen by the surgeon Dr. Jamal Collin. 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 She is scheduled to have laser ablation done next week.Pt was sent her for possible biopsy to look for vasculitis.Feel it would be better to wait after laser ablation is completed- the ulcer may heal fully and biopsy may not be necessary Electronic Signature(s) Signed: 04/21/2015 4:47:08 PM By: [name], [name] Entered By: [name] on 04/21/2015 15:12:41 [name], [name] ([id number]) -------------------------------------------------------------------------------- Physical Exam Details Patient Name: [name], [name] 04/21/2015 2:30 Date of Service: PM Medical Record [id number] Number: Patient Account Number: [id number] 31-May-1946  (69 y.o. Treating RN: Date of Birth/Sex: Female) Other Clinician: Primary Care Physician: [facility], [name] Treating Cherene Dobbins Referring Physician: Glendon Axe Physician/Extender: Weeks in Treatment: 11 Constitutional . Pulse regular. Respirations normal and unlabored.  Afebrile. . Eyes Nonicteric. Reactive to light. Ears, Nose, Mouth, and Throat Lips, teeth, and gums WNL.Marland Kitchen Moist mucosa without lesions . Neck supple and nontender. No palpable supraclavicular or cervical adenopathy. Normal sized without goiter. Respiratory WNL. No retractions.. Cardiovascular Pedal Pulses WNL. No clubbing, cyanosis or edema. Chest Breasts symmetical and no nipple discharge.. Breast tissue WNL, no masses, lumps, or tenderness.. Musculoskeletal Adexa without tenderness or enlargement.. Digits and nails w/o clubbing, cyanosis, infection, petechiae, ischemia, or inflammatory conditions.. Integumentary (Hair, Skin) The dense eschar on her left lower extremity has loosened a bit and I will try to sharply debride this after giving her local ointment for analgesia.. No crepitus or fluctuance. No peri-wound warmth or erythema. No masses.Marland Kitchen Psychiatric Judgement and insight Intact.. No evidence of depression, anxiety, or agitation.. Notes The dense eschar on her left lower extremity has loosened a bit and I will try to sharply debride this after giving her local ointment for analgesia. Electronic Signature(s) Signed: 04/21/2015 4:47:08 PM By: [name], [name] Entered By: [name] on 04/21/2015 15:13:51 Silberstein, IDOLINA MANTELL ([id number]) -------------------------------------------------------------------------------- Physician Orders Details Patient Name: [name], [name] 04/21/2015 2:30 Date of Service: PM Medical Record [id number] Number: Patient Account Number: [id number] Apr 20, 1946 (69 y.o. Treating RN: [name] Date of Birth/Sex: Female) Other Clinician: Primary Care Physician: [facility],  Delana Meyer Treating [name] Referring Physician: Glendon Axe Physician/Extender: Suella Grove in Treatment: 11 Verbal / Phone Orders: Yes Clinician: [name] Read Back and Verified: Yes Diagnosis Coding Wound Cleansing Wound #2 Left,Distal Lower Leg o Cleanse wound with mild soap and water o Cleanse wound with mild soap and water Anesthetic Wound #2 Left,Distal Lower Leg o Topical Lidocaine 4% cream applied to wound bed prior to debridement Primary Wound Dressing Wound #2 Left,Distal Lower Leg o Aquacel Ag Secondary Dressing Wound #2 Left,Distal Lower Leg o ABD pad Dressing Change Frequency Wound #2 Left,Distal Lower Leg o Change dressing every week Follow-up Appointments Wound #2 Left,Distal Lower Leg o Return Appointment in 1 week. Edema Control Wound #2 Left,Distal Lower Leg o 2 Layer Compression System - Left Lower Extremity Electronic Signature(s) Signed: 04/21/2015 4:47:08 PM By: [name], [name] Signed: 04/21/2015 5:55:26 PM By: [name] RN, [name], [name], [name] Overturf, Veverly D. ([id number]) Entered By: [name], RN, [name], Kim on 04/21/2015 15:06:26 Dagostino, VALDA CHRISTENSON ([id number]) -------------------------------------------------------------------------------- Problem List Details Patient Name: [name], [name] 04/21/2015 2:30 Date of Service: PM Medical Record [id number] Number: Patient Account Number: [id number] [birth year] (69 y.o. 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 04/21/2015 2:30 Date of Service: PM Medical Record [id number] Number: Patient Account Number: [id number] [birth year] (69 y.o. Treating RN: Date of Birth/Sex: Female) Other Clinician: Primary Care Physician: [facility], [place] [address] Meneely Referring  Physician: Glendon Axe Physician/Extender: Weeks in Treatment: 11 Subjective Chief Complaint Information obtained from Patient Patient presents to the wound care center for a consult due non healing wound 69 year old patient comes with a history of having a ulcer on the left lower extremity for the past 4 weeks. she says she's had swelling of both lower extremities for about a year after she started having prednisone. 02/07/2015 -- her vascular appointments obtained were in the first and third week of June. she is able to go to [place] and we will try and get her some earlier appointments. Other than that nothing else has changed in her management. History of Present Illness (HPI) 69 year old patient who is known to have diabetes mellitus type 2, chronic renal insufficiency, coronary artery disease, hypertension, hypercholesterolemia, temporal arteritis and inflammatory arthritiss also has a history of having a hysterectomy and some orthopedic related surgeries. The ulcer on the left lower extremity started off as a blister and then. Got progressively worse. She does not have any  fever or chills and has not had any recent surgical intervention for this. Her last hemoglobin A1c was 10.1 in September 2015. She has been recently put on doxycycline by her PCP. She is now also allergic to doxycycline and was this was changed over to Keflex. 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 She does see a cardiologist and also takes a diuretic. 02/07/2015 her arterial and venous duplex studies to be done have dates been given as the first and third week of June. This is at [facility]. We are going to try and get early appointments at [facility]. other than that nothing has changed in her management. 02/14/2015 -- we have been able to get her an appointment in [facility] on May 20 which is much earlier than her previous ones at [place]. She continues with her prednisone and her sugars are in the range of 150-200. 02/21/2015 We were able to get a vascular lab workup for her today and she is going to be there at 2:00 this afternoon. the swelling of her leg has gone down significantly but she still has some tenderness over the wounds. 02/28/2015 - She has had one of two vascular workups done, and this coming Tuesday has another, at [place] [facility]. She continues to be on steroid medications. She has significant sensitivity in her left lower extremity and has pain suggestive of neuropathic pain and I have asked her to address this with her primary care physician. 03/07/2015 -- The patient saw Dr. Lucky Cowboy for a consultation and he has had her arteries are okay but she has 2 incompetent veins on the left lower extremity and he is going to set her up for surgery. Official report is TONYIA, MARSCHALL ([id number]) awaited. Addendum: Official reports are now available and on 03/04/2015. She was seen and lower extremity venous duplex exam was done. There was reflux  present within the left greater saphenous vein below the knee and also the left small saphenous vein. Arterial duplex showed normal triphasic waveforms throughout the left lower extremity without any significant stenosis. Her ABIs were noncompressible bilaterally but a waveforms were normal and a digital pressures were normal bilaterally consistent with no significant arterial insufficiency. He has recommended endovenous ablation of both the left small saphenous and the left great saphenous vein. This would still be scheduled later. 03/14/2015 -- she has  heard back from the vascular office and has surgery scheduled for sometime in July. Her rheumatologist has decreased her prednisone dosage but she still on it. She has also had cataract surgery in her right eye recently this week. 03/20/2015 - No new complaints today. Pain improved. No fever or chills. Tolerating 2 layer compression. 04/14/2015 -- she was doing very well today she went off on vacation and now her edema has increased markedly the ulceration is bigger and her diabetes is not under control. 04/21/2015 -- I spoke to her PCP Dr. Candiss Norse and discussed the management which would include being seen by a general surgeon for debridement and taking multiple punch biopsies which would help in establishing the diagnosis of this is a vasculitis. She is agreeable about this and will set her up for the procedure with Dr. Tamala Julian at [facility]. She was seen by the surgeon Dr. Jamal Collin. 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 She is scheduled to have laser ablation done next week.Pt was sent her for possible biopsy to look for vasculitis.Feel it would be better to wait after laser ablation is completed- the ulcer may heal fully and biopsy may not be necessary Objective Constitutional Pulse regular. Respirations normal and  unlabored. Afebrile. Vitals Time Taken: 2:36 PM, Height: 65 in, Weight: 248 lbs, BMI: 41.3, Temperature: 97.5 F, Pulse: 82 bpm, Respiratory Rate: 18 breaths/min, Blood Pressure: 117/65 mmHg. Eyes Nonicteric. Reactive to light. Ears, Nose, Mouth, and Throat Lips, teeth, and gums WNL.Marland Kitchen Moist mucosa without lesions . Neck Whack, Blen D. ([id number]) supple and nontender. No palpable supraclavicular or cervical adenopathy. Normal sized without goiter. Respiratory WNL. No retractions.. Cardiovascular Pedal Pulses WNL. No clubbing, cyanosis or edema. Chest Breasts symmetical and no nipple discharge.. Breast tissue WNL, no masses, lumps, or tenderness.. Musculoskeletal Adexa without tenderness or enlargement.. Digits and nails w/o clubbing, cyanosis, infection, petechiae, ischemia, or inflammatory conditions.Marland Kitchen Psychiatric Judgement and insight Intact.. No evidence of depression, anxiety, or agitation.. General Notes: The dense eschar on her left lower extremity has loosened a bit and I will try to sharply debride this after giving her local ointment for analgesia. Integumentary (Hair, Skin) The dense eschar on her left lower extremity has loosened a bit and I will try to sharply debride this after giving her local ointment for analgesia.. No crepitus or fluctuance. No peri-wound warmth or erythema. No masses.. Wound #2 status is Open. Original cause of wound was Blister. The wound is located on the Left,Distal Lower Leg. The wound measures 3.8cm length x 3cm width x 0.4cm depth; 8.954cm^2 area and 3.581cm^3 volume. The wound is limited to skin breakdown. There is a medium amount of serosanguineous drainage noted. The wound margin is distinct with the outline attached to the wound base. There is no granulation within the wound bed. There is a large (67-100%) amount of necrotic tissue within the wound bed including Eschar and Adherent Slough. The periwound skin appearance  exhibited: Hemosiderin Staining. The periwound skin appearance did not exhibit: Callus, Crepitus, Excoriation, Fluctuance, Friable, Induration, Localized Edema, Rash, Scarring, Dry/Scaly, Maceration, Moist, Atrophie Blanche, Cyanosis, Ecchymosis, Mottled, Pallor, Rubor, Erythema. Periwound temperature was noted as No Abnormality. The periwound has tenderness on palpation. 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 She is to have endovenous ablation this coming Friday and at some stage we will take a punch biopsy under local anesthesia of the edges of the wound. She will come back and see me next week. Procedures Wound #2 Wound #2 is a Venous Leg Ulcer located on the Left,Distal Lower Leg . There was a Skin/Subcutaneous Tissue Debridement ([id number]) debridement with total area of 11.4 sq cm performed by Kingstyn Deruiter, Jackson Latino., MD. with the following instrument(s): Forceps and Scissors to remove Viable and Non-Viable tissue/material including Exudate, Fat, Fibrin/Slough, Eschar, and Subcutaneous after achieving pain control using Lidocaine 4% Topical Solution. A time out was conducted prior to the start of the procedure. A Moderate amount of bleeding was controlled with Pressure. The procedure was tolerated well with a pain level of 3 throughout and a pain level of 3 following the procedure. Post Debridement Measurements: 3.8cm  length x 3cm width x 0.5cm depth; 4.477cm^3 volume. Plan Wound Cleansing: Wound #2 Left,Distal Lower Leg: Cleanse wound with mild soap and water Cleanse wound with mild soap and water Anesthetic: Wound #2 Left,Distal Lower Leg: Topical Lidocaine 4% cream applied to wound bed prior to debridement Primary Wound Dressing: Wound #2 Left,Distal Lower Leg: Aquacel Ag Secondary Dressing: Wound #2 Left,Distal Lower Leg: ABD pad Dressing Change Frequency: Wound #2 Left,Distal Lower Leg: Campusano, Amiri D. ([id number]) Change dressing every week Follow-up Appointments: Wound #2 Left,Distal Lower Leg: Return Appointment in 1 week. Edema Control: Wound #2 Left,Distal Lower Leg: 2 Layer Compression System - Left Lower Extremity After sharply debriding the necrotic debris from her left lower extremity I will continue with silver alginate and a 2 layer compression wrap. She is to have endovenous ablation this coming Friday and at some stage we will take a punch biopsy under local anesthesia of the edges of the wound. She will come back and see me next week. Electronic Signature(s) Signed: 04/21/2015 4:47:08 PM By: [name], [name] Entered By: [name] on 04/21/2015 15:15:11 Dice, Elaya D. ([id number]) -------------------------------------------------------------------------------- SuperBill Details Patient Name: ZYRIA, FISCUS D. Date of Service: 04/21/2015 Medical Record Number: [id number] Patient Account Number: [id number] Date of Birth/Sex: 1946-09-28 (68 y.o. 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 Facility Procedures CPT4: Description Modifier Quantity Code [id number] - DEB SUBQ TISSUE 20 SQ CM/< 1 ICD-10 Description Diagnosis E11.622 Type 2 diabetes mellitus with other skin ulcer I83.222 Varicose veins of left lower extremity with both ulcer of calf and  inflammation L97.322 Non-pressure chronic ulcer of left ankle with fat layer exposed Physician Procedures CPT4: Description Modifier Quantity Code 5631497 02637 - WC PHYS LEVEL 3 - EST PT 25 1 ICD-10 Description Diagnosis E11.622 Type 2 diabetes mellitus  with other skin ulcer L97.322 Non-pressure chronic 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

## 2015-04-29 ENCOUNTER — Encounter: Payer: PPO | Attending: Surgery | Admitting: Surgery

## 2015-04-29 DIAGNOSIS — I890 Lymphedema, not elsewhere classified: Secondary | ICD-10-CM | POA: Insufficient documentation

## 2015-04-29 DIAGNOSIS — R600 Localized edema: Secondary | ICD-10-CM | POA: Diagnosis not present

## 2015-04-29 DIAGNOSIS — E1140 Type 2 diabetes mellitus with diabetic neuropathy, unspecified: Secondary | ICD-10-CM | POA: Insufficient documentation

## 2015-04-29 DIAGNOSIS — H269 Unspecified cataract: Secondary | ICD-10-CM | POA: Insufficient documentation

## 2015-04-29 DIAGNOSIS — L97321 Non-pressure chronic ulcer of left ankle limited to breakdown of skin: Secondary | ICD-10-CM | POA: Insufficient documentation

## 2015-04-29 DIAGNOSIS — I2510 Atherosclerotic heart disease of native coronary artery without angina pectoris: Secondary | ICD-10-CM | POA: Insufficient documentation

## 2015-04-29 DIAGNOSIS — E11622 Type 2 diabetes mellitus with other skin ulcer: Secondary | ICD-10-CM | POA: Insufficient documentation

## 2015-04-29 DIAGNOSIS — M1990 Unspecified osteoarthritis, unspecified site: Secondary | ICD-10-CM | POA: Insufficient documentation

## 2015-04-29 DIAGNOSIS — J45909 Unspecified asthma, uncomplicated: Secondary | ICD-10-CM | POA: Insufficient documentation

## 2015-04-29 DIAGNOSIS — I10 Essential (primary) hypertension: Secondary | ICD-10-CM | POA: Insufficient documentation

## 2015-04-29 NOTE — Progress Notes (Addendum)
Kristin, [name] ([id number]) Visit Report for 04/29/2015 Chief Complaint Document Details Patient Name: [name], [name] Date of Service: 04/29/2015 1:45 PM Medical Record Number: [id number] Patient Account Number: [id number] Date of Birth/Sex: 20-Dec-1945 (68 y.o. Female) Treating RN: Primary Care Physician: Glendon Axe Other Clinician: Referring Physician: Glendon Axe Treating Physician/Extender: Frann Rider in Treatment: 12 Information Obtained from: Patient Chief Complaint Patient presents to the wound care center for a consult due non healing wound 68 year old patient comes with a history of having a ulcer on the left lower extremity for the past 4 weeks. she says she's had swelling of both lower extremities for about a year after she started having prednisone. 02/07/2015 -- her vascular appointments obtained were in the first and third week of June. she is able to go to [place] and we will try and get her some earlier appointments. Other than that nothing else has changed in her management. Electronic Signature(s) Signed: 04/29/2015 3:45:00 PM By: [name], [name] Entered By: [name] on 04/29/2015 14:37:22 Fatica, Talasia D. ([id number]) -------------------------------------------------------------------------------- HPI Details Patient Name: Kristin, [name] Date of Service: 04/29/2015 1:45 PM Medical Record Number: [id number] Patient Account Number: [id number] Date of Birth/Sex: [birth year] (68 y.o. Female) Treating RN: Primary Care Physician: Glendon Axe Other Clinician: Referring Physician: Glendon Axe Treating Physician/Extender: Frann Rider in Treatment: 12 History of Present Illness HPI Description: 69 year old patient who is known to have diabetes mellitus type 2, chronic renal insufficiency, coronary artery disease, hypertension, hypercholesterolemia, temporal arteritis and inflammatory arthritiss also has a history of having a  hysterectomy and some orthopedic related surgeries. The ulcer on the left lower extremity started off as a blister and then. Got progressively worse. She does not have any fever or chills and has not had any recent surgical intervention for this. Her last hemoglobin A1c was 10.1 in September 2015. She has been recently put on doxycycline by her PCP. She is now also allergic to doxycycline and was this was changed over to Keflex. 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 She does see a cardiologist and also takes a diuretic. 02/07/2015 her arterial and venous duplex studies to be done have dates been given as the first and third week of June. This is at [facility]. We are going to try and get early appointments at [facility]. other than that nothing has changed in her management. 02/14/2015 -- we have been able to get her an appointment in [facility] on May 20 which is much earlier than her previous ones at [place]. She continues with her prednisone and her sugars are in the range of 150-200. 02/21/2015 We were able to get a vascular lab workup for her today and she is going to be there at 2:00 this afternoon. the swelling of her leg has gone down significantly but she still has some tenderness over the wounds. 02/28/2015 - She has had one of two vascular workups done, and this coming Tuesday has another, at [place] [facility]. She continues to be on steroid medications. She has significant sensitivity in her left lower extremity and has pain suggestive of neuropathic pain and I have asked her to address this with her primary care physician. 03/07/2015 -- The patient saw Dr. 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 Official report  is awaited. Addendum: Official reports are now available and on 03/04/2015. She was seen and lower extremity venous duplex exam was done. There was reflux present within the left greater saphenous vein below the knee and also the left small saphenous vein. Arterial duplex showed normal triphasic waveforms throughout the left lower extremity without any significant stenosis. Her ABIs were noncompressible bilaterally but a waveforms were normal and a digital pressures were normal bilaterally consistent with no significant arterial insufficiency. He has recommended endovenous ablation of both the left small saphenous and the left great saphenous vein. This would still be scheduled later. 03/14/2015 -- she has heard back from the vascular office and has surgery scheduled for sometime in July. Her rheumatologist has decreased her prednisone dosage but she still on it. She has also had cataract surgery in her right eye recently this week. Kristin, [name] ([id number]) 03/20/2015 - No new complaints today. Pain improved. No fever or chills. Tolerating 2 layer compression. 04/14/2015 -- she was doing very well today she went off on vacation and now her edema has increased markedly the ulceration is bigger and her diabetes is not under control. 04/21/2015 -- I spoke to her PCP Dr. Candiss Norse and discussed the management which would include being seen by a general surgeon for debridement and taking multiple punch biopsies which would help in establishing the diagnosis of this is a vasculitis. She is agreeable about this and will set her up for the procedure with Dr. Tamala Julian at [facility]. She was seen by the surgeon Dr. Jamal Collin. 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 She is scheduled to have laser ablation done next week.Pt was sent her for possible biopsy to look for vasculitis.Feel it  would be better to wait after laser ablation is completed- the ulcer may heal fully and biopsy may not be necessary 04/29/2015 -- she had the venous ablation done by Dr. Lucky Cowboy last Friday and we do not have any notes yet. She is doing fine otherwise. Electronic Signature(s) Signed: 04/29/2015 3:45:00 PM By: [name], [name] Entered By: [name] on 04/29/2015 14:38:06 Witcher, Karine DMarland Kitchen ([id number]) -------------------------------------------------------------------------------- Physical Exam Details Patient Name: MILEAH, HEMMER D. Date of Service: 04/29/2015 1:45 PM Medical Record Number: [id number] Patient Account Number: [id number] Date of Birth/Sex: May 10, 1946 (68 y.o. Female) Treating RN: Primary Care Physician: Glendon Axe Other Clinician: Referring Physician: Glendon Axe Treating Physician/Extender: Frann Rider in Treatment: 12 Constitutional . Pulse regular. Respirations normal and unlabored. Afebrile. . Eyes Nonicteric. Reactive to light. Ears, Nose, Mouth, and Throat Lips, teeth, and gums WNL.Marland Kitchen Moist mucosa without lesions . Neck supple and nontender. No palpable supraclavicular or cervical adenopathy. Normal sized without goiter. Respiratory WNL. No retractions.. Cardiovascular Pedal Pulses WNL. No clubbing, cyanosis or edema. Musculoskeletal Adexa without tenderness or enlargement.. Digits and nails w/o clubbing, cyanosis, infection, petechiae, ischemia, or inflammatory conditions.. Integumentary (Hair, Skin) the ulcerated area of her left lower extremity has a lot of slough at the base but is very tender and we will use chemical debridement for this.. No crepitus or fluctuance. No peri-wound warmth or erythema. No masses.Marland Kitchen Psychiatric Judgement and insight Intact.. No evidence of depression, anxiety, or agitation.. Electronic Signature(s) Signed: 04/29/2015 3:45:00 PM By: [name], [name] Entered By: [name] on 04/29/2015 14:38:50 [place],  Tia Masker ([id number]) -------------------------------------------------------------------------------- Physician Orders Details Patient Name: Igou,  Nalayah D. Date of Service: 04/29/2015 1:45 PM Medical Record Number: [id number] Patient Account Number: [id number] Date of Birth/Sex: November 25, 1945 (68 y.o. Female) Treating RN: [name] Primary Care Physician: Glendon Axe Other Clinician: Referring Physician: Glendon Axe Treating Physician/Extender: Frann Rider in Treatment: 12 Verbal / Phone Orders: Yes Clinician: [name] Read Back and Verified: Yes Diagnosis Coding Wound Cleansing Wound #2 Left,Distal Lower Leg o Clean wound with Normal Saline. Anesthetic Wound #2 Left,Distal Lower Leg o Topical Lidocaine 4% cream applied to wound bed prior to debridement Primary Wound Dressing Wound #2 Left,Distal Lower Leg o Santyl Ointment Secondary Dressing Wound #2 Left,Distal Lower Leg o ABD pad Dressing Change Frequency Wound #2 Left,Distal Lower Leg o Change dressing every day. Follow-up Appointments Wound #2 Left,Distal Lower Leg o Return Appointment in 1 week. Edema Control Wound #2 Left,Distal Lower Leg o Support Garment 20-30 mm/Hg pressure to: Additional Orders / Instructions Wound #2 Left,Distal Lower Leg o Increase protein intake. Medications-please add to medication list. Wound #2 Left,Distal Lower Leg Eller, Makalya D. ([id number]) o Santyl Enzymatic Ointment Patient Medications Allergies: Cipro, Levaquin, penicillin, Sulfa (Sulfonamide Antibiotics), doxycycline Notifications Medication Indication Start End Santyl 04/29/2015 DOSE topical 250 unit/gram ointment - ointment topical as directed Electronic Signature(s) Signed: 04/29/2015 2:36:55 PM By: [name], [name] Entered By: [name] on 04/29/2015 14:36:53 Navarra, Obera D. ([id number]) -------------------------------------------------------------------------------- Problem List  Details Patient Name: LOREAN, EKSTRAND D. Date of Service: 04/29/2015 1:45 PM Medical Record Number: [id number] Patient Account Number: [id number] Date of Birth/Sex: 1946-05-31 (68 y.o. 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 ([id number]) -------------------------------------------------------------------------------- Progress Note Details Patient Name: Bannan, Sharonlee D. Date of Service: 04/29/2015 1:45 PM Medical Record Number: [id number] Patient Account Number: [id number] Date of Birth/Sex: June 21, 1946 (68 y.o. Female) Treating RN: Primary Care Physician: Glendon Axe Other Clinician: Referring Physician: Glendon Axe Treating Physician/Extender: Frann Rider in Treatment: 12 Subjective Chief Complaint Information obtained from Patient Patient presents to the wound care center for a consult due non healing wound 69 year old patient comes with a history of having a ulcer on the left lower extremity for the past 4 weeks. she says she's had swelling of both lower extremities for about a year after she started having  prednisone. 02/07/2015 -- her vascular appointments obtained were in the first and third week of June. she is able to go to [place] and we will try and get her some earlier appointments. Other than that nothing else has changed in her management. History of Present Illness (HPI) 69 year old patient who is known to have diabetes mellitus type 2, chronic renal insufficiency, coronary artery disease, hypertension, hypercholesterolemia, temporal arteritis and inflammatory arthritiss also has a history of having a hysterectomy and some orthopedic related surgeries. The ulcer on the left lower extremity started off as a blister  and then. Got progressively worse. She does not have any fever or chills and has not had any recent surgical intervention for this. Her last hemoglobin A1c was 10.1 in September 2015. She has been recently put on doxycycline by her PCP. She is now also allergic to doxycycline and was this was changed over to Keflex. 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 She does see a cardiologist and also takes a diuretic. 02/07/2015 her arterial and venous duplex studies to be done have dates been given as the first and third week of June. This is at [facility]. We are going to try and get early appointments at [facility]. other than that nothing has changed in her management. 02/14/2015 -- we have been able to get her an appointment in [facility] on May 20 which is much earlier than her previous ones at [place]. She continues with her prednisone and her sugars are in the range of 150-200. 02/21/2015 We were able to get a vascular lab workup for her today and she is going to be there at 2:00 this afternoon. the swelling of her leg has gone down significantly but she still has some tenderness over the wounds. 02/28/2015 - She has had one of two vascular workups done, and  this coming Tuesday has another, at [place] [facility]. She continues to be on steroid medications. She has significant sensitivity in her left lower extremity and has pain suggestive of neuropathic pain and I have asked her to address this with her primary care physician. 03/07/2015 -- The patient saw Dr. Lucky Cowboy for a consultation and he has had her arteries are okay but she has 2 incompetent veins on the left lower extremity and he is going to set her up for surgery. Official report is awaited. Addendum: Official reports are now available and on 03/04/2015. She was seen and lower extremity Bigler, Deryn D. ([id number]) venous duplex exam was done. There was reflux present within the left greater saphenous vein below the knee and also the left small saphenous vein. Arterial duplex showed normal triphasic waveforms throughout the left lower extremity without any significant stenosis. Her ABIs were noncompressible bilaterally but a waveforms were normal and a digital pressures were normal bilaterally consistent with no significant arterial insufficiency. He has recommended endovenous ablation of both the left small saphenous and the left great saphenous vein. This would still be scheduled later. 03/14/2015 -- she has heard back from the vascular office and has surgery scheduled for sometime in July. Her rheumatologist has decreased her prednisone dosage but she still on it. She has also had cataract surgery in her right eye recently this week. 03/20/2015 - No new complaints today. Pain improved. No fever or chills. Tolerating 2 layer compression. 04/14/2015 -- she was doing very well today she went off on vacation and now her edema has increased markedly the ulceration is bigger and her diabetes is not under control. 04/21/2015 -- I spoke to her PCP Dr. Candiss Norse and discussed the management which would include being seen by a general surgeon for debridement and taking multiple punch  biopsies which would help in establishing the diagnosis of this is a vasculitis. She is agreeable about this and will set her up for the procedure with Dr. Tamala Julian at [facility]. She was seen by the surgeon Dr. Jamal Collin. 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 She is scheduled to have laser ablation done next week.Pt was sent her for possible biopsy to look for vasculitis.Feel it would be better to wait after laser ablation is completed- the ulcer may heal fully and biopsy may not be necessary 04/29/2015 -- she had the venous ablation done by Dr. Lucky Cowboy last Friday and we do not have any notes yet. She is doing fine otherwise. Objective Constitutional Pulse regular. Respirations normal and unlabored. Afebrile. Vitals Time Taken: 2:15 PM, Height: 65 in, Weight: 248 lbs, BMI: 41.3, Temperature: 97.9 F, Pulse: 88 bpm, Respiratory Rate: 18 breaths/min, Blood Pressure: 110/62 mmHg. Eyes Nonicteric. Reactive to light. Ears, Nose, Mouth, and Throat Lips, teeth, and gums WNL.Marland Kitchen Moist mucosa without lesions . Neck supple and nontender. No palpable supraclavicular or cervical adenopathy. Normal sized without goiter. KRYSTYNE, [place] ([id number]) Respiratory WNL. No retractions.. Cardiovascular Pedal Pulses WNL. No clubbing, cyanosis or edema. Musculoskeletal Adexa without tenderness or enlargement.. Digits and nails w/o clubbing, cyanosis, infection, petechiae, ischemia, or inflammatory conditions.Marland Kitchen Psychiatric Judgement and insight Intact.. No evidence of depression, anxiety, or agitation.. Integumentary (Hair, Skin) the ulcerated area of her left lower extremity has a lot of slough at the base but is very tender and we will use chemical debridement for this.. No crepitus or fluctuance. No peri-wound warmth or erythema. No masses.. Wound #2 status is Open. Original cause of  wound was Blister. The wound is located on the Left,Distal Lower Leg. The wound measures 5cm length x 3.2cm width x 0.5cm depth; 12.566cm^2 area and 6.283cm^3 volume. The wound is limited to skin breakdown. There is no tunneling or undermining noted. There is a medium amount of serosanguineous drainage noted. The wound margin is distinct with the outline attached to the wound base. There is no granulation within the wound bed. There is a large (67- 100%) amount of necrotic tissue within the wound bed including Eschar and Adherent Slough. The periwound skin appearance exhibited: Moist, Hemosiderin Staining. The periwound skin appearance did not exhibit: Callus, Crepitus, Excoriation, Fluctuance, Friable, Induration, Localized Edema, Rash, Scarring, Dry/Scaly, Maceration, Atrophie Blanche, Cyanosis, Ecchymosis, Mottled, Pallor, Rubor, Erythema. Periwound temperature was noted as No Abnormality. The periwound has tenderness on palpation. 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 ([id number]) I have recommended Santyl ointment over the ulcerated area on her left lower extremity and apply her compression stockings of the 20-30 mm variety. She will come back and see me next week. Plan Wound Cleansing: Wound #2 Left,Distal Lower Leg: Clean wound with Normal Saline. Anesthetic: Wound #2 Left,Distal Lower Leg: Topical Lidocaine 4% cream applied to wound bed prior to debridement Primary Wound Dressing: Wound #2 Left,Distal Lower Leg: Santyl Ointment Secondary Dressing: Wound #2  Left,Distal Lower Leg: ABD pad Dressing Change Frequency: Wound #2 Left,Distal Lower Leg: Change dressing every day. Follow-up Appointments: Wound #2 Left,Distal Lower Leg: Return Appointment in 1 week. Edema Control: Wound #2 Left,Distal Lower Leg: Support Garment 20-30 mm/Hg pressure to: Additional Orders / Instructions: Wound #2 Left,Distal Lower Leg: Increase protein intake. Medications-please add to medication list.:  Wound #2 Left,Distal Lower Leg: Santyl Enzymatic Ointment The following medication(s) was prescribed: Santyl topical 250 unit/gram ointment ointment topical as directed starting 04/29/2015 I have recommended Santyl ointment over the ulcerated area on her left lower extremity and apply her compression stockings of the 20-30 mm variety. ASALEE, BARRETTE ([id number]) She will come back and see me next week. Electronic Signature(s) Signed: 04/29/2015 3:45:00 PM By: [name], [name] Entered By: [name] on 04/29/2015 14:39:32 Gazzola, Jessieca D. ([id number]) -------------------------------------------------------------------------------- SuperBill Details Patient Name: CHERYLE, DARK D. Date of Service: 04/29/2015 Medical Record Number: [id number] Patient Account Number: [id number] Date of Birth/Sex: 1946-02-23 (68 y.o. 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

## 2015-04-30 NOTE — Progress Notes (Signed)
JENITA, RAYFIELD ([id number]) Visit Report for 04/29/2015 Arrival Information Details Patient Name: [name], [name] Date of Service: 04/29/2015 1:45 PM Medical Record Number: [id number] Patient Account Number: [id number] Date of Birth/Sex: 03-02-1946 (69 y.o. Female) Treating RN: [name] Primary Care Physician: Glendon Axe Other Clinician: Referring Physician: Glendon Axe Treating Physician/Extender: Frann Rider in Treatment: 12 Visit Information History Since Last Visit Added or deleted any medications: No Patient Arrived: Ambulatory Any new allergies or adverse reactions: No Arrival Time: 14:14 Signs or symptoms of abuse/neglect since last No Accompanied By: daughter visito Transfer Assistance: None Hospitalized since last visit: No Patient Identification Verified: Yes Has Dressing in Place as Prescribed: Yes Secondary Verification Process Yes Has Compression in Place as Prescribed: Yes Completed: Pain Present Now: No Patient Has Alerts: Yes Patient Alerts: Patient on Blood Thinner Electronic Signature(s) Signed: 04/29/2015 5:42:28 PM By: [name], RN, [name], [name], [name] Entered By: [name], RN, [name], Kim on 04/29/2015 14:15:08 Lasch, Amanada DMarland Kitchen ([id number]) -------------------------------------------------------------------------------- Clinic Level of Care Assessment Details Patient Name: [name], [name] Date of Service: 04/29/2015 1:45 PM Medical Record Number: [id number] Patient Account Number: [id number] Date of Birth/Sex: 11-17-1945 (69 y.o. Female) Treating RN: [name] Primary Care Physician: Glendon Axe Other Clinician: Referring Physician: Glendon Axe Treating Physician/Extender: Frann Rider in Treatment: 12 Clinic Level of Care Assessment Items TOOL 4 Quantity Score []  - Use when only an EandM is performed on FOLLOW-UP visit 0 ASSESSMENTS - Nursing Assessment / Reassessment []  - Reassessment of Co-morbidities (includes updates in patient  status) 0 X - Reassessment of Adherence to Treatment Plan 1 5 ASSESSMENTS - Wound and Skin Assessment / Reassessment X - Simple Wound Assessment / Reassessment - one wound 1 5 []  - Complex Wound Assessment / Reassessment - multiple wounds 0 []  - Dermatologic / Skin Assessment (not related to wound area) 0 ASSESSMENTS - Focused Assessment []  - Circumferential Edema Measurements - multi extremities 0 []  - Nutritional Assessment / Counseling / Intervention 0 []  - Lower Extremity Assessment (monofilament, tuning fork, pulses) 0 []  - Peripheral Arterial Disease Assessment (using hand held doppler) 0 ASSESSMENTS - Ostomy and/or Continence Assessment and Care []  - Incontinence Assessment and Management 0 []  - Ostomy Care Assessment and Management (repouching, etc.) 0 PROCESS - Coordination of Care X - Simple Patient / Family Education for ongoing care 1 15 []  - Complex (extensive) Patient / Family Education for ongoing care 0 X - Staff obtains Programmer, systems, Records, Test Results / Process Orders 1 10 []  - Staff telephones HHA, Nursing Homes / Clarify orders / etc 0 []  - Routine Transfer to another Facility (non-emergent condition) 0 Georgia, Trudy D. ([id number]) []  - Routine Hospital Admission (non-emergent condition) 0 []  - New Admissions / Biomedical engineer / Ordering NPWT, Apligraf, etc. 0 []  - Emergency Hospital Admission (emergent condition) 0 X - Simple Discharge Coordination 1 10 []  - Complex (extensive) Discharge Coordination 0 PROCESS - Special Needs []  - Pediatric / Minor Patient Management 0 []  - Isolation Patient Management 0 []  - Hearing / Language / Visual special needs 0 []  - Assessment of Community assistance (transportation, D/C planning, etc.) 0 []  - Additional assistance / Altered mentation 0 []  - Support Surface(s) Assessment (bed, cushion, seat, etc.) 0 INTERVENTIONS - Wound Cleansing / Measurement X - Simple Wound Cleansing - one wound 1 5 []  - Complex Wound  Cleansing - multiple wounds 0 X - Wound Imaging (photographs - any number of wounds) 1 5 []  - Wound Tracing (instead of photographs)  0 X - Simple Wound Measurement - one wound 1 5 []  - Complex Wound Measurement - multiple wounds 0 INTERVENTIONS - Wound Dressings []  - Small Wound Dressing one or multiple wounds 0 X - Medium Wound Dressing one or multiple wounds 1 15 []  - Large Wound Dressing one or multiple wounds 0 []  - Application of Medications - topical 0 []  - Application of Medications - injection 0 INTERVENTIONS - Miscellaneous []  - External ear exam 0 Hindle, Shian D. ([id number]) []  - Specimen Collection (cultures, biopsies, blood, body fluids, etc.) 0 []  - Specimen(s) / Culture(s) sent or taken to Lab for analysis 0 []  - Patient Transfer (multiple staff / [name]) 0 []  - Simple Staple / Suture removal (25 or less) 0 []  - Complex Staple / Suture removal (26 or more) 0 []  - Hypo / Hyperglycemic Management (close monitor of Blood Glucose) 0 []  - Ankle / Brachial Index (ABI) - do not check if billed separately 0 X - Vital Signs 1 5 Has the patient been seen at the hospital within the last three years: Yes Total Score: 80 Level Of Care: New/Established - Level 3 Electronic Signature(s) Signed: 04/29/2015 5:42:28 PM By: [name], RN, [name], [name], [name] Entered By: [name], RN, [name], Kim on 04/29/2015 14:47:17 Lwin, Kathlene DMarland Kitchen ([id number]) -------------------------------------------------------------------------------- Encounter Discharge Information Details Patient Name: [name], [name] Date of Service: 04/29/2015 1:45 PM Medical Record Number: [id number] Patient Account Number: [id number] Date of Birth/Sex: [birth year] (69 y.o. Female) Treating RN: [name] Primary Care Physician: Glendon Axe Other Clinician: Referring Physician: Glendon Axe Treating Physician/Extender: Frann Rider in Treatment: 12 Encounter Discharge Information Items Discharge Pain  Level: 0 Discharge Condition: Stable Ambulatory Status: Ambulatory Discharge Destination: Home Private Transportation: Auto Accompanied By: daughter Schedule Follow-up Appointment: Yes Medication Reconciliation completed and Yes provided to Patient/Care Therma Lasure: Clinical Summary of Care: Electronic Signature(s) Signed: 04/29/2015 5:42:28 PM By: [name], RN, [name], [name], [name] Entered By: [name], RN, [name], Kim on 04/29/2015 14:48:36 Hodapp, Tia Masker ([id number]) -------------------------------------------------------------------------------- Lower Extremity Assessment Details Patient Name: ELIOT, BENCIVENGA D. Date of Service: 04/29/2015 1:45 PM Medical Record Number: [id number] Patient Account Number: [id number] Date of Birth/Sex: 1946-10-20 (69 y.o. Female) Treating RN: [name] Primary Care Physician: Glendon Axe Other Clinician: Referring Physician: Glendon Axe Treating Physician/Extender: Frann Rider in Treatment: 12 Edema Assessment Assessed: [Left: No] [Right: No] E[Left: dema] [Right: :] Calf Left: Right: Point of Measurement: 33 cm From Medial Instep 38.7 cm cm Ankle Left: Right: Point of Measurement: 12 cm From Medial Instep 22.2 cm cm Vascular Assessment Pulses: Posterior Tibial Dorsalis Pedis Palpable: [Left:Yes] Extremity colors, hair growth, and conditions: Extremity Color: [Left:Hyperpigmented] Hair Growth on Extremity: [Left:No] Temperature of Extremity: [Left:Warm] Capillary Refill: [Left:< 3 seconds] Toe Nail Assessment Left: Right: Thick: No Discolored: No Deformed: No Improper Length and Hygiene: No Electronic Signature(s) Signed: 04/29/2015 5:42:28 PM By: [name], RN, [name], [name], [name] Entered By: [name], RN, [name], Kim on 04/29/2015 14:21:17 Bostock, Tamanika D. ([id number]) -------------------------------------------------------------------------------- Multi Wound Chart Details Patient Name: [name] Date of Service: 04/29/2015 1:45  PM Medical Record Number: [id number] Patient Account Number: [id number] Date of Birth/Sex: 05-Mar-1946 (69 y.o. Female) Treating RN: [name] Primary Care Physician: Glendon Axe Other Clinician: Referring Physician: Glendon Axe Treating Physician/Extender: Frann Rider in Treatment: 12 Vital Signs Height(in): 65 Pulse(bpm): 88 Weight(lbs): 248 Blood Pressure 110/62 (mmHg): Body Mass Index(BMI): 41 Temperature(F): 97.9 Respiratory Rate 18 (breaths/min): Photos: [2:No Photos] [N/A:N/A] Wound Location: [2:Left Lower Leg -  Distal] [N/A:N/A] Wounding Event: [2:Blister] [N/A:N/A] Primary Etiology: [2:Venous Leg Ulcer] [N/A:N/A] Comorbid History: [2:Cataracts, Asthma, Coronary Artery Disease, Hypertension, Type II Diabetes, Osteoarthritis, Neuropathy] [N/A:N/A] Date Acquired: [2:12/30/2014] [N/A:N/A] Weeks of Treatment: [2:12] [N/A:N/A] Wound Status: [2:Open] [N/A:N/A] Clustered Wound: [2:Yes] [N/A:N/A] Measurements L x W x D 5x3.2x0.5 [N/A:N/A] (cm) Area (cm) : [2:12.566] [N/A:N/A] Volume (cm) : [2:6.283] [N/A:N/A] % Reduction in Area: [2:-1677.40%] [N/A:N/A] % Reduction in Volume: -8749.30% [N/A:N/A] Classification: [2:Full Thickness Without Exposed Support Structures] [N/A:N/A] HBO Classification: [2:Grade 1] [N/A:N/A] Exudate Amount: [2:Medium] [N/A:N/A] Exudate Type: [2:Serosanguineous] [N/A:N/A] Exudate Color: [2:red, brown] [N/A:N/A] Wound Margin: [2:Distinct, outline attached] [N/A:N/A] Granulation Amount: [2:None Present (0%)] [N/A:N/A] Necrotic Amount: [2:Large (67-100%)] [N/A:N/A] Necrotic Tissue: [2:Eschar, Adherent Slough] [N/A:N/A] Exposed Structures: Fascia: No N/A N/A Fat: No Tendon: No Muscle: No Joint: No Bone: No Limited to Skin Breakdown Epithelialization: None N/A N/A Periwound Skin Texture: Edema: No N/A N/A Excoriation: No Induration: No Callus: No Crepitus: No Fluctuance: No Friable: No Rash: No Scarring: No Periwound  Skin Moist: Yes N/A N/A Moisture: Maceration: No Dry/Scaly: No Periwound Skin Color: Hemosiderin Staining: Yes N/A N/A Atrophie Blanche: No Cyanosis: No Ecchymosis: No Erythema: No Mottled: No Pallor: No Rubor: No Temperature: No Abnormality N/A N/A Tenderness on Yes N/A N/A Palpation: Wound Preparation: Ulcer Cleansing: N/A N/A Rinsed/Irrigated with Saline Topical Anesthetic Applied: Other: lidocaine 4% Treatment Notes Electronic Signature(s) Signed: 04/29/2015 5:42:28 PM By: [name], RN, [name], [name], [name] Entered By: [name], RN, [name], Kim on 04/29/2015 14:27:14 Presti, Tia Masker ([id number]) -------------------------------------------------------------------------------- Multi-Disciplinary Care Plan Details Patient Name: DEERICA, WASZAK D. Date of Service: 04/29/2015 1:45 PM Medical Record Number: [id number] Patient Account Number: [id number] Date of Birth/Sex: June 17, 1946 (69 y.o. Female) Treating RN: [name] Primary Care Physician: Glendon Axe Other Clinician: Referring Physician: Glendon Axe Treating Physician/Extender: Frann Rider in Treatment: 12 Active Inactive Orientation to the Wound Care Program Nursing Diagnoses: Knowledge deficit related to the wound healing center program Goals: Patient/caregiver will verbalize understanding of the [facility] Program Date Initiated: 01/31/2015 Goal Status: Active Interventions: Provide education on orientation to the wound center Notes: Venous Leg Ulcer Nursing Diagnoses: Potential for venous Insuffiency (use before diagnosis confirmed) Goals: Non-invasive venous studies are completed as ordered Date Initiated: 01/31/2015 Goal Status: Active Patient/caregiver will verbalize understanding of disease process and disease management Date Initiated: 01/31/2015 Goal Status: Active Interventions: Assess peripheral edema status every visit. Notes: Wound/Skin Impairment Nursing Diagnoses: Impaired tissue  integrity Knowledge deficit related to smoking impact on wound healing Biela, Suvi D. ([id number]) Goals: Patient/caregiver will verbalize understanding of skin care regimen Date Initiated: 01/31/2015 Goal Status: Active Ulcer/skin breakdown will heal within 14 weeks Date Initiated: 01/31/2015 Goal Status: Active Interventions: Assess ulceration(s) every visit Notes: Electronic Signature(s) Signed: 04/29/2015 5:42:28 PM By: [name], RN, [name], [name], [name] Entered By: [name], RN, [name], Kim on 04/29/2015 14:27:07 Baris, Latrica D. ([id number]) -------------------------------------------------------------------------------- Pain Assessment Details Patient Name: GAVIN, FAIVRE D. Date of Service: 04/29/2015 1:45 PM Medical Record Number: [id number] Patient Account Number: [id number] Date of Birth/Sex: 1946/06/15 (69 y.o. Female) Treating RN: [name] Primary Care Physician: Glendon Axe Other Clinician: Referring Physician: Glendon Axe Treating Physician/Extender: Frann Rider in Treatment: 12 Active Problems Location of Pain Severity and Description of Pain Patient Has Paino No Site Locations Pain Management and Medication Current Pain Management: Electronic Signature(s) Signed: 04/29/2015 5:42:28 PM By: [name], RN, [name], [name], [name] Entered By: [name], RN, [name], Kim on 04/29/2015 14:15:15 Tiley, Tia Masker ([id number]) -------------------------------------------------------------------------------- Patient/Caregiver Education Details Patient Name: Delauter,  Tessa D. Date of Service: 04/29/2015 1:45 PM Medical Record Number: [id number] Patient Account Number: [id number] Date of Birth/Gender: 02/06/1946 (69 y.o. Female) Treating RN: [name] Primary Care Physician: Glendon Axe Other Clinician: Referring Physician: Glendon Axe Treating Physician/Extender: Frann Rider in Treatment: 12 Education Assessment Education Provided To: Patient Education Topics Provided Wound/Skin  Impairment: Handouts: Caring for Your Ulcer, Other: apply Santyl daily; wear compression stockings Electronic Signature(s) Signed: 04/29/2015 5:42:28 PM By: [name], RN, [name], [name], [name] Entered By: [name], RN, [name], Kim on 04/29/2015 14:49:09 Cosner, Melany DMarland Kitchen ([id number]) -------------------------------------------------------------------------------- Wound Assessment Details Patient Name: LEXXI, KOSLOW D. Date of Service: 04/29/2015 1:45 PM Medical Record Number: [id number] Patient Account Number: [id number] Date of Birth/Sex: 12-Jun-1946 (69 y.o. Female) Treating RN: [name] Primary Care Physician: Glendon Axe Other Clinician: Referring Physician: Glendon Axe Treating Physician/Extender: Frann Rider in Treatment: 12 Wound Status Wound Number: 2 Primary Venous Leg Ulcer Etiology: Wound Location: Left Lower Leg - Distal Wound Open Wounding Event: Blister Status: Date Acquired: 12/30/2014 Comorbid Cataracts, Asthma, Coronary Artery Weeks Of Treatment: 12 History: Disease, Hypertension, Type II Clustered Wound: Yes Diabetes, Osteoarthritis, Neuropathy Photos Photo Uploaded By: [name], RN, [name], Kim on 04/29/2015 15:15:56 Wound Measurements Length: (cm) 5 Width: (cm) 3.2 Depth: (cm) 0.5 Area: (cm) 12.566 Volume: (cm) 6.283 % Reduction in Area: -1677.4% % Reduction in Volume: -8749.3% Epithelialization: None Tunneling: No Undermining: No Wound Description Full Thickness Without Exposed Foul Odor A Classification: Support Structures Diabetic Severity Grade 1 (Wagner): TIAJUANA, LEPPANEN ([id number]) fter Cleansing: No Wound Margin: Distinct, outline attached Exudate Amount: Medium Exudate Type: Serosanguineous Exudate Color: red, brown Wound Bed Granulation Amount: None Present (0%) Exposed Structure Necrotic Amount: Large (67-100%) Fascia Exposed: No Necrotic Quality: Eschar, Adherent Slough Fat Layer Exposed: No Tendon Exposed: No Muscle Exposed:  No Joint Exposed: No Bone Exposed: No Limited to Skin Breakdown Periwound Skin Texture Texture Color No Abnormalities Noted: No No Abnormalities Noted: No Callus: No Atrophie Blanche: No Crepitus: No Cyanosis: No Excoriation: No Ecchymosis: No Fluctuance: No Erythema: No Friable: No Hemosiderin Staining: Yes Induration: No Mottled: No Localized Edema: No Pallor: No Rash: No Rubor: No Scarring: No Temperature / Pain Moisture Temperature: No Abnormality No Abnormalities Noted: No Tenderness on Palpation: Yes Dry / Scaly: No Maceration: No Moist: Yes Wound Preparation Ulcer Cleansing: Rinsed/Irrigated with Saline Topical Anesthetic Applied: Other: lidocaine 4%, Treatment Notes Wound #2 (Left, Distal Lower Leg) 1. Cleansed with: Clean wound with Normal Saline 2. Anesthetic Topical Lidocaine 4% cream to wound bed prior to debridement 4. Dressing Applied: Santyl Ointment 5. Secondary Dressing Applied Mamula, Javionna D. ([id number]) Non-Adherent pad Gauze and Kerlix/Conform 7. Secured with Paper tape Support Garment 20-30 mm/Hg pressure to: Engineer, maintenance) Signed: 04/29/2015 5:42:28 PM By: [name], RN, [name], [name], [name] Entered By: [name], RN, [name], Kim on 04/29/2015 14:24:07 Behringer, Tia Masker ([id number]) -------------------------------------------------------------------------------- Vitals Details Patient Name: UNIKA, NAZARENO D. Date of Service: 04/29/2015 1:45 PM Medical Record Number: [id number] Patient Account Number: [id number] Date of Birth/Sex: Dec 27, 1945 (69 y.o. Female) Treating RN: [name] Primary Care Physician: Glendon Axe Other Clinician: Referring Physician: Glendon Axe Treating Physician/Extender: Frann Rider in Treatment: 12 Vital Signs Time Taken: 14:15 Temperature (F): 97.9 Height (in): 65 Pulse (bpm): 88 Weight (lbs): 248 Respiratory Rate (breaths/min): 18 Body Mass Index (BMI): 41.3 Blood Pressure (mmHg):  110/62 Reference Range: 80 - 120 mg / dl Electronic Signature(s) Signed: 04/29/2015 5:42:28 PM By: [name], RN, [name], [name], [name] Entered By: [name], RN, [name], Kim on  04/29/2015 14:17:32 

## 2015-05-05 ENCOUNTER — Encounter: Payer: Self-pay | Admitting: Ophthalmology

## 2015-05-06 ENCOUNTER — Encounter: Payer: PPO | Admitting: Surgery

## 2015-05-06 ENCOUNTER — Other Ambulatory Visit
Admission: RE | Admit: 2015-05-06 | Discharge: 2015-05-06 | Disposition: A | Discharge status: Home / Self Care | Payer: PPO | Source: Ambulatory Visit | Attending: Surgery | Admitting: Surgery

## 2015-05-06 DIAGNOSIS — L97321 Non-pressure chronic ulcer of left ankle limited to breakdown of skin: Secondary | ICD-10-CM | POA: Diagnosis not present

## 2015-05-06 DIAGNOSIS — B999 Unspecified infectious disease: Secondary | ICD-10-CM | POA: Diagnosis present

## 2015-05-06 NOTE — Progress Notes (Addendum)
TAMEIA, RAFFERTY ([id number]) Visit Report for 05/06/2015 Debridement Details Patient Name: Kristin [name], [name] 05/06/2015 1:00 Date of Service: PM Medical Record [id number] Number: Patient Account Number: [id number] 1946/02/28 (69 y.o. Treating RN: [name] Date of Birth/Sex: Female) Other Clinician: Primary Care Physician: [facility], Delana Meyer Treating [name] Referring Physician: Glendon Axe Physician/Extender: Weeks in Treatment: 13 Debridement Performed for Wound #2 Left,Distal Lower Leg Assessment: Performed By: Physician Pat Patrick., MD Debridement: Debridement Pre-procedure Yes Verification/Time Out Taken: Start Time: 13:41 Pain Control: Lidocaine 4% Topical Solution Level: Skin/Subcutaneous Tissue Total Area Debrided (L x 6.2 (cm) x 3.7 (cm) = 22.94 (cm) W): Instrument: Forceps, Scissors Bleeding: Minimum Hemostasis Achieved: Pressure End Time: 13:48 Procedural Pain: 0 Post Procedural Pain: 0 Response to Treatment: Procedure was tolerated well Electronic Signature(s) Signed: 05/06/2015 4:00:02 PM By: [name], [name] Signed: 05/06/2015 5:27:11 PM By: [name] Entered By: [name] on 05/06/2015 13:49:07 Kristin [name] [name] ([id number]) -------------------------------------------------------------------------------- HPI Details Patient Name: Kristin [name], [name] D. 05/06/2015 1:00 Date of Service: PM Medical Record [id number] Number: Patient Account Number: [id number] 1946-04-26 (69 y.o. Treating RN: Date of Birth/Sex: Female) Other Clinician: Primary Care Physician: [facility], [name] Treating Lourie Retz Referring Physician: Glendon Axe Physician/Extender: Weeks in Treatment: 13 History of Present Illness HPI Description: 69 year old patient who is known to have diabetes mellitus type 2, chronic renal insufficiency, coronary artery disease, hypertension, hypercholesterolemia, temporal arteritis and inflammatory arthritiss also has a  history of having a hysterectomy and some orthopedic related surgeries. The ulcer on the left lower extremity started off as a blister and then. Got progressively worse. She does not have any fever or chills and has not had any recent surgical intervention for this. Her last hemoglobin A1c was 10.1 in September 2015. She has been recently put on doxycycline by her PCP. She is now also allergic to doxycycline and was this was changed over to Keflex. 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 She does see a cardiologist and also takes a diuretic. 02/07/2015 her arterial and venous duplex studies to be done have dates been given as the first and third week of June. This is at [facility]. We are going to try and get early appointments at [facility]. other than that nothing has changed in her management. 02/14/2015 -- we have been able to get her an appointment in [facility] on May 20 which is much earlier than her previous ones at [place]. She continues with her prednisone and her sugars are in the range of 150-200. 02/21/2015 We were able to get a vascular lab workup for her today and she is going to be there at 2:00 this afternoon. the swelling of her leg has gone down significantly but she still has some tenderness over the wounds. 02/28/2015 - She has had one of two vascular workups done, and this coming Tuesday has another, at [facility]. She continues to be on steroid medications. She has significant sensitivity in her left lower extremity and has pain suggestive of neuropathic pain and I have asked her to address this with her primary care physician. 03/07/2015 -- The patient saw Dr. Lucky Cowboy for a consultation and he has had her arteries are okay but she has 2 incompetent veins on the left lower extremity and he is going to set her up for surgery. Official  report is awaited. Addendum: Official reports are now  available and on 03/04/2015. She was seen and lower extremity venous duplex exam was done. There was reflux present within the left greater saphenous vein below the knee and also the left small saphenous vein. Arterial duplex showed normal triphasic waveforms throughout the left lower extremity without any significant stenosis. Her ABIs were noncompressible bilaterally but a waveforms were normal and a digital pressures were normal bilaterally consistent with no significant arterial insufficiency. He has recommended endovenous ablation of both the left small saphenous and the left great saphenous vein. This would still be scheduled later. 03/14/2015 -- she has heard back from the vascular office and has surgery scheduled for sometime in July. Kristin [name], [name] ([id number]) Her rheumatologist has decreased her prednisone dosage but she still on it. She has also had cataract surgery in her right eye recently this week. 03/20/2015 - No new complaints today. Pain improved. No fever or chills. Tolerating 2 layer compression. 04/14/2015 -- she was doing very well today she went off on vacation and now her edema has increased markedly the ulceration is bigger and her diabetes is not under control. 04/21/2015 -- I spoke to her PCP Dr. Candiss Norse and discussed the management which would include being seen by a general surgeon for debridement and taking multiple punch biopsies which would help in establishing the diagnosis of this is a vasculitis. She is agreeable about this and will set her up for the procedure with Dr. Tamala Julian at [facility]. She was seen by the surgeon Dr. Jamal Collin. 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 She is scheduled to have laser ablation done next week.Pt was sent her for possible biopsy to look for vasculitis.Feel  it would be better to wait after laser ablation is completed- the ulcer may heal fully and biopsy may not be necessary 04/29/2015 -- she had the venous ablation done by Dr. Lucky Cowboy last Friday and we do not have any notes yet. She is doing fine otherwise. 05/06/2015 --Review of her recent vascular intervention shows that she was seen by Dr. Lucky Cowboy on 04/29/2015. The follow-up duplex which was done showed that both the great saphenous vein and the small saphenous vein remained patent with reflux consistent with an unsuccessful ablation. He has rescheduled her for another the endovenous ablation to be done in about 4 weekso time. Electronic Signature(s) Signed: 05/06/2015 1:52:56 PM By: [name], [name] Previous Signature: 05/06/2015 1:37:15 PM Version By: [name], [name] Entered By: [name] on 05/06/2015 13:52:55 Kristin [name], ITZAMARA [name] ([id number]) -------------------------------------------------------------------------------- Physical Exam Details Patient Name: Kristin [name], [name] D. 05/06/2015 1:00 Date of Service: PM Medical Record [id number] Number: Patient Account Number: [id number] 09-Mar-1946 (69 y.o. Treating RN: Date of Birth/Sex: Female) Other Clinician: Primary Care Physician: [facility], [name] Treating Andrick Rust Referring Physician: Glendon Axe Physician/Extender: Weeks in Treatment: 13 Constitutional . Pulse regular. Respirations normal and unlabored. Afebrile. . Eyes Nonicteric. Reactive to light. Ears, Nose, Mouth, and Throat Lips, teeth, and gums WNL.Marland [name] Moist mucosa without lesions . Neck supple and nontender. No palpable supraclavicular or cervical adenopathy. Normal sized without goiter. Respiratory WNL. No retractions.. Cardiovascular Pedal Pulses WNL. she has significant edema of the left lower extremity.. Chest Breasts symmetical and no nipple discharge.. Breast tissue WNL, no masses, lumps, or tenderness.. Musculoskeletal Adexa without tenderness or  enlargement.. Digits and nails w/o clubbing, cyanosis, infection, petechiae, ischemia, or inflammatory conditions.. Integumentary (Hair, Skin) No suspicious lesions. No crepitus or fluctuance. No  peri-wound warmth or erythema. No masses.Marland [name] Psychiatric Judgement and insight Intact.. No evidence of depression, anxiety, or agitation.. Notes the wound has a lot of necrotic base and is foul-smelling and there is some pus underneath there and after sharp debridement I shall culture this again today. Electronic Signature(s) Signed: 05/06/2015 1:53:58 PM By: [name], [name] Entered By: [name] on 05/06/2015 13:53:57 Cundy, Kristin [name] [name] ([id number]) -------------------------------------------------------------------------------- Physician Orders Details Patient Name: Kristin [name], [name] D. 05/06/2015 1:00 Date of Service: PM Medical Record [id number] Number: Patient Account Number: [id number] February 10, 1946 (69 y.o. Treating RN: [name] Date of Birth/Sex: Female) Other Clinician: Primary Care Physician: [facility], Delana Meyer Treating [name] Referring Physician: Glendon Axe Physician/Extender: Suella Grove in Treatment: 13 Verbal / Phone Orders: Yes Clinician: [name] Read Back and Verified: Yes Diagnosis Coding Wound Cleansing Wound #2 Left,Distal Lower Leg o Clean wound with Normal Saline. Anesthetic Wound #2 Left,Distal Lower Leg o Topical Lidocaine 4% cream applied to wound bed prior to debridement Primary Wound Dressing Wound #2 Left,Distal Lower Leg o Santyl Ointment Secondary Dressing Wound #2 Left,Distal Lower Leg o ABD and Kerlix/Conform Dressing Change Frequency Wound #2 Left,Distal Lower Leg o Change dressing every day. Follow-up Appointments Wound #2 Left,Distal Lower Leg o Return Appointment in 1 week. Edema Control Wound #2 Left,Distal Lower Leg o Support Garment 20-30 mm/Hg pressure to: Additional Orders / Instructions Wound #2 Left,Distal  Lower Leg o Increase protein intake. Kristin [name], [name] ([id number]) Medications-please add to medication list. Wound #2 Left,Distal Lower Leg o P.O. Antibiotics - clindamycin o Santyl Enzymatic Ointment Laboratory o Culture and Sensitivity - left lower leg oooo Patient Medications Allergies: Cipro, Levaquin, penicillin, Sulfa (Sulfonamide Antibiotics), doxycycline Notifications Medication Indication Start End clindamycin HCl 05/06/2015 DOSE 2 - oral 300 mg capsule - 2 capsule oral PO three times daily Electronic Signature(s) Signed: 05/06/2015 2:00:11 PM By: [name], [name] Entered By: [name] on 05/06/2015 14:00:11 Bergdoll, Tiesha DMarland [name] ([id number]) -------------------------------------------------------------------------------- Progress Note Details Patient Name: Kristin [name] Stare D. 05/06/2015 1:00 Date of Service: PM Medical Record [id number] Number: Patient Account Number: [id number] April 19, 1946 (69 y.o. Treating RN: Date of Birth/Sex: Female) Other Clinician: Primary Care Physician: [facility], [name] Treating Kylia Grajales Referring Physician: Glendon Axe Physician/Extender: Weeks in Treatment: 13 Subjective History of Present Illness (HPI) 69 year old patient who is known to have diabetes mellitus type 2, chronic renal insufficiency, coronary artery disease, hypertension, hypercholesterolemia, temporal arteritis and inflammatory arthritiss also has a history of having a hysterectomy and some orthopedic related surgeries. The ulcer on the left lower extremity started off as a blister and then. Got progressively worse. She does not have any fever or chills and has not had any recent surgical intervention for this. Her last hemoglobin A1c was 10.1 in September 2015. She has been recently put on doxycycline by her PCP. She is now also allergic to doxycycline and was this was changed over to Keflex. due to her temporal arteritis she has been on prednisone for about a  year and she says ever since that she has had swelling of both lower extremities. She does see a cardiologist and also takes a diuretic. 02/07/2015 her arterial and venous duplex studies to be done have dates been given as the first and third week of June. This is at [facility]. We are going to try and get early appointments at [facility]. other than that nothing has changed in her management. 02/14/2015 -- we have been able to get her an appointment in [facility] on May 20 which is much  earlier than her previous ones at [facility]. She continues with her prednisone and her sugars are in the range of 150-200. 02/21/2015 We were able to get a vascular lab workup for her today and she is going to be there at 2:00 this afternoon. the swelling of her leg has gone down significantly but she still has some tenderness over the wounds. 02/28/2015 - She has had one of two vascular workups done, and this coming Tuesday has another, at [facility]. She continues to be on steroid medications. She has significant sensitivity in her left lower extremity and has pain suggestive of neuropathic pain and I have asked her to address this with her primary care physician. 03/07/2015 -- The patient saw Dr. Lucky Cowboy for a consultation and he has had her arteries are okay but she has 2 incompetent veins on the left lower extremity and he is going to set her up for surgery. Official report is awaited. Addendum: Official reports are now available and on 03/04/2015. She was seen and lower extremity venous duplex exam was done. There was reflux present within the left greater saphenous vein below the knee and also the left small saphenous vein. Arterial duplex showed normal triphasic waveforms throughout the left lower extremity without any significant stenosis. Her ABIs were noncompressible bilaterally but a waveforms were normal and a digital pressures were normal bilaterally consistent with no  significant arterial insufficiency. He has recommended endovenous ablation of both the left small saphenous and the left great saphenous vein. This would still be scheduled later. Kristin [name], [name] ([id number]) 03/14/2015 -- she has heard back from the vascular office and has surgery scheduled for sometime in July. Her rheumatologist has decreased her prednisone dosage but she still on it. She has also had cataract surgery in her right eye recently this week. 03/20/2015 - No new complaints today. Pain improved. No fever or chills. Tolerating 2 layer compression. 04/14/2015 -- she was doing very well today she went off on vacation and now her edema has increased markedly the ulceration is bigger and her diabetes is not under control. 04/21/2015 -- I spoke to her PCP Dr. Candiss Norse and discussed the management which would include being seen by a general surgeon for debridement and taking multiple punch biopsies which would help in establishing the diagnosis of this is a vasculitis. She is agreeable about this and will set her up for the procedure with Dr. Tamala Julian at [facility]. She was seen by the surgeon Dr. Jamal Collin. 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 She is scheduled to have laser ablation done next week.Pt was sent her for possible biopsy to look for vasculitis.Feel it would be better to wait after laser ablation is completed- the ulcer may heal fully and biopsy may not be necessary 04/29/2015 -- she had the venous ablation done by Dr. Lucky Cowboy last Friday and we do not have any notes yet. She is doing fine otherwise. 05/06/2015 --Review of her recent vascular intervention shows that she was seen by Dr. Lucky Cowboy on 04/29/2015. The follow-up duplex which was done showed that both the great saphenous vein and the small saphenous vein remained patent with reflux consistent with an  unsuccessful ablation. He has rescheduled her for another the endovenous ablation to be done in about 4 weeks time. Objective Constitutional Pulse regular. Respirations normal and unlabored. Afebrile. Vitals Time Taken: 1:15 PM, Height: 65 in, Weight: 248  lbs, BMI: 41.3, Temperature: 98.3 F, Pulse: 67 bpm, Respiratory Rate: 18 breaths/min, Blood Pressure: 127/53 mmHg, Capillary Blood Glucose: 97 mg/dl. Eyes Nonicteric. Reactive to light. Ears, Nose, Mouth, and Throat Lips, teeth, and gums WNL.Marland [name] Moist mucosa without lesions . Neck supple and nontender. No palpable supraclavicular or cervical adenopathy. Normal sized without goiter. Respiratory Kristin [name], Elvia D. ([id number]) WNL. No retractions.. Cardiovascular Pedal Pulses WNL. she has significant edema of the left lower extremity.. Chest Breasts symmetical and no nipple discharge.. Breast tissue WNL, no masses, lumps, or tenderness.. Musculoskeletal Adexa without tenderness or enlargement.. Digits and nails w/o clubbing, cyanosis, infection, petechiae, ischemia, or inflammatory conditions.Marland [name] Psychiatric Judgement and insight Intact.. No evidence of depression, anxiety, or agitation.. General Notes: the wound has a lot of necrotic base and is foul-smelling and there is some pus underneath there and after sharp debridement I shall culture this again today. Integumentary (Hair, Skin) No suspicious lesions. No crepitus or fluctuance. No peri-wound warmth or erythema. No masses.. Wound #2 status is Open. Original cause of wound was Blister. The wound is located on the Left,Distal Lower Leg. The wound measures 6.2cm length x 3.7cm width x 0.5cm depth; 18.017cm^2 area and 9.009cm^3 volume. The wound is limited to skin breakdown. There is no tunneling or undermining noted. There is a medium amount of serosanguineous drainage noted. The wound margin is distinct with the outline attached to the wound base. There is no granulation within the  wound bed. There is a large (67- 100%) amount of necrotic tissue within the wound bed including Eschar and Adherent Slough. The periwound skin appearance exhibited: Moist, Hemosiderin Staining. The periwound skin appearance did not exhibit: Callus, Crepitus, Excoriation, Fluctuance, Friable, Induration, Localized Edema, Rash, Scarring, Dry/Scaly, Maceration, Atrophie Blanche, Cyanosis, Ecchymosis, Mottled, Pallor, Rubor, Erythema. Periwound temperature was noted as No Abnormality. The periwound has tenderness on palpation. Assessment The patient is going to be rescheduled for vascular venous surgery again by Dr. Lucky Cowboy. today after sharp debridement I noticed that there is a lot of debris which was removed and there was a pocket of pus which I have cultured. Will put her on clindamycin and will speak to her surgeon when she sees him on Thursday. I do believe she needs sharp debridement in the OR under anesthesia and biopsies of the surrounding issue to look for vasculitis. I have also asked her to place her compression stockings from morning to night every day and elevate her limb as much as possible. Kristin [name], [name] ([id number]) Procedures Wound #2 Wound #2 is a Venous Leg Ulcer located on the Left,Distal Lower Leg . There was a Skin/Subcutaneous Tissue Debridement ([id number]) debridement with total area of 22.94 sq cm performed by Rustin Erhart, Jackson Latino., MD. with the following instrument(s): Forceps and Scissors after achieving pain control using Lidocaine 4% Topical Solution. A time out was conducted prior to the start of the procedure. A Minimum amount of bleeding was controlled with Pressure. The procedure was tolerated well with a pain level of 0 throughout and a pain level of 0 following the procedure. Plan Wound Cleansing: Wound #2 Left,Distal Lower Leg: Clean wound with Normal Saline. Anesthetic: Wound #2 Left,Distal Lower Leg: Topical Lidocaine 4% cream applied to wound bed prior to  debridement Primary Wound Dressing: Wound #2 Left,Distal Lower Leg: Santyl Ointment Secondary Dressing: Wound #2 Left,Distal Lower Leg: ABD and Kerlix/Conform Dressing Change Frequency: Wound #2 Left,Distal Lower Leg: Change dressing every day. Follow-up Appointments: Wound #2 Left,Distal Lower Leg: Return Appointment in 1 week. Edema Control:  Wound #2 Left,Distal Lower Leg: Support Garment 20-30 mm/Hg pressure to: Additional Orders / Instructions: Wound #2 Left,Distal Lower Leg: Increase protein intake. Medications-please add to medication list.: Wound #2 Left,Distal Lower Leg: P.O. Antibiotics - clindamycin Santyl Enzymatic Ointment Laboratory ordered were: Culture and Sensitivity - left lower leg The following medication(s) was prescribed: clindamycin HCl oral 300 mg capsule 2 2 capsule oral PO three times daily starting 05/06/2015 HARBOUR, NORDMEYER D. ([id number]) The patient is going to be rescheduled for vascular venous surgery again by Dr. Lucky Cowboy. today after sharp debridement I noticed that there is a lot of debris which was removed and there was a pocket of pus which I have cultured. Will put her on clindamycin and will speak to her surgeon when she sees him on Thursday. I do believe she needs sharp debridement in the OR under anesthesia and biopsies of the surrounding issue to look for vasculitis. I have also asked her to place her compression stockings from morning to night every day and elevate her limb as much as possible. Electronic Signature(s) Signed: 05/06/2015 3:59:32 PM By: [name], [name] Previous Signature: 05/06/2015 1:55:56 PM Version By: [name], [name] Entered By: [name] on 05/06/2015 15:59:31 Errington, Jadis DMarland [name] ([id number]) -------------------------------------------------------------------------------- SuperBill Details Patient Name: KAELA, BEITZ D. Date of Service: 05/06/2015 Medical Record Number: [id number] Patient Account Number:  [id number] Date of Birth/Sex: 11/16/1945 (68 y.o. 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 Facility Procedures CPT4 Code: [id number] Description: 11042 - DEB SUBQ TISSUE 20 SQ CM/< ICD-10 Description Diagnosis I89.0 Lymphedema, not elsewhere classified E11.622 Type 2 diabetes mellitus with other skin ulcer L97.322 Non-pressure chronic ulcer of left ankle with fat l Modifier: ayer exposed Quantity: 1 CPT4 Code: [id number] Description: 11045 - DEB SUBQ TISS EA ADDL 20CM ICD-10 Description Diagnosis E11.622 Type 2 diabetes mellitus with other skin ulcer L97.322 Non-pressure chronic ulcer of left ankle with fat l I89.0 Lymphedema, not elsewhere classified Modifier: ayer exposed Quantity: 1 Physician Procedures CPT4 Code: [id number] Mount, Anaelle Description: 11042 - WC PHYS SUBQ TISS 20 SQ CM ICD-10 Description Diagnosis I89.0 Lymphedema, not elsewhere classified E11.622 Type 2 diabetes mellitus with other skin ulcer L97.322 Non-pressure chronic ulcer of left ankle with fat la D. ([id number]) Modifier: yer exposed Quantity: 1 Electronic Signature(s) Signed: 05/06/2015 1:56:23 PM By: [name], [name] Entered By: [name] on 05/06/2015 13:56:23

## 2015-05-07 NOTE — Progress Notes (Signed)
ZITLALY, MALSON ([id number]) Visit Report for 05/06/2015 Arrival Information Details Patient Name: [name], [name] Date of Service: 05/06/2015 1:00 PM Medical Record Number: [id number] Patient Account Number: [id number] Date of Birth/Sex: 07-23-1946 (68 y.o. Female) Treating RN: [name] Primary Care Physician: Glendon Axe Other Clinician: Referring Physician: Glendon Axe Treating Physician/Extender: Frann Rider in Treatment: 13 Visit Information History Since Last Visit Added or deleted any medications: No Patient Arrived: Ambulatory Any new allergies or adverse reactions: No Arrival Time: 13:08 Had a fall or experienced change in No Accompanied By: granddaughter activities of daily living that may affect Transfer Assistance: None risk of falls: Patient Identification Verified: Yes Signs or symptoms of abuse/neglect since last No Secondary Verification Yes visito Process Completed: Hospitalized since last visit: No Patient Has Alerts: Yes Pain Present Now: No Patient Alerts: Patient on Blood Thinner Electronic Signature(s) Signed: 05/06/2015 5:27:11 PM By: [name] Entered By: [name] on 05/06/2015 13:15:07 Trenkamp, Lauralye D. ([id number]) -------------------------------------------------------------------------------- Encounter Discharge Information Details Patient Name: Kristin Stare D. Date of Service: 05/06/2015 1:00 PM Medical Record Number: [id number] Patient Account Number: [id number] Date of Birth/Sex: 1946-01-05 (68 y.o. Female) Treating RN: [name] Primary Care Physician: Glendon Axe Other Clinician: Referring Physician: Glendon Axe Treating Physician/Extender: Frann Rider in Treatment: 13 Encounter Discharge Information Items Discharge Pain Level: 0 Discharge Condition: Stable Ambulatory Status: Cane Discharge Destination: Home Transportation: Private Auto Accompanied By: granddaughter Schedule Follow-up  Appointment: Yes Medication Reconciliation completed No and provided to Patient/Care Hattie Pine: Clinical Summary of Care: Electronic Signature(s) Signed: 05/06/2015 5:27:11 PM By: [name] Entered By: [name] on 05/06/2015 14:06:07 [name], [name] ([id number]) -------------------------------------------------------------------------------- Lower Extremity Assessment Details Patient Name: Kristin Stare D. Date of Service: 05/06/2015 1:00 PM Medical Record Number: [id number] Patient Account Number: [id number] Date of Birth/Sex: Sep 28, 1946 (68 y.o. Female) Treating RN: [name] Primary Care Physician: Glendon Axe Other Clinician: Referring Physician: Glendon Axe Treating Physician/Extender: Frann Rider in Treatment: 13 Edema Assessment Assessed: [Left: No] [Right: No] Edema: [Left: Ye] [Right: s] Calf Left: Right: Point of Measurement: 33 cm From Medial Instep 38.3 cm cm Ankle Left: Right: Point of Measurement: 12 cm From Medial Instep 23.6 cm cm Vascular Assessment Pulses: Posterior Tibial Palpable: [Left:Yes] Extremity colors, hair growth, and conditions: Extremity Color: [Left:Hyperpigmented] Hair Growth on Extremity: [Left:Yes] Temperature of Extremity: [Left:Warm] Capillary Refill: [Left:< 3 seconds] Toe Nail Assessment Left: Right: Thick: No Discolored: No Deformed: No Improper Length and Hygiene: No Electronic Signature(s) Signed: 05/06/2015 5:27:11 PM By: [name] Entered By: [name] on 05/06/2015 13:19:24 [name], [name] ([id number]) -------------------------------------------------------------------------------- Multi Wound Chart Details Patient Name: Kristin Stare D. Date of Service: 05/06/2015 1:00 PM Medical Record Number: [id number] Patient Account Number: [id number] Date of Birth/Sex: 20-Nov-1945 (68 y.o. Female) Treating RN: [name] Primary Care Physician: Glendon Axe Other Clinician: Referring  Physician: Glendon Axe Treating Physician/Extender: Frann Rider in Treatment: 13 Vital Signs Height(in): 65 Capillary Blood 97 Glucose(mg/dl): Weight(lbs): 248 Pulse(bpm): 59 Body Mass Index(BMI): 41 Blood Pressure Temperature(F): 98.3 127/53 (mmHg): Respiratory Rate 18 (breaths/min): Photos: [2:No Photos] [N/A:N/A] Wound Location: [2:Left Lower Leg - Distal] [N/A:N/A] Wounding Event: [2:Blister] [N/A:N/A] Primary Etiology: [2:Venous Leg Ulcer] [N/A:N/A] Comorbid History: [2:Cataracts, Asthma, Coronary Artery Disease, Hypertension, Type II Diabetes, Osteoarthritis, Neuropathy] [N/A:N/A] Date Acquired: [2:12/30/2014] [N/A:N/A] Weeks of Treatment: [2:13] [N/A:N/A] Wound Status: [2:Open] [N/A:N/A] Clustered Wound: [2:Yes] [N/A:N/A] Measurements L x W x D 6.2x3.7x0.5 [N/A:N/A] (cm) Area (cm) : [2:18.017] [N/A:N/A] Volume (cm) : [2:9.009] [N/A:N/A] % Reduction in Area: [2:-2448.40%] [N/A:N/A] %  Reduction in Volume: -12588.70% [N/A:N/A] Classification: [2:Full Thickness Without Exposed Support Structures] [N/A:N/A] HBO Classification: [2:Grade 1] [N/A:N/A] Exudate Amount: [2:Medium] [N/A:N/A] Exudate Type: [2:Serosanguineous] [N/A:N/A] Exudate Color: [2:red, brown] [N/A:N/A] Wound Margin: [2:Distinct, outline attached] [N/A:N/A] Granulation Amount: [2:None Present (0%)] [N/A:N/A] Necrotic Amount: [2:Large (67-100%)] [N/A:N/A] Necrotic Tissue: [2:Eschar, Adherent Slough] [N/A:N/A] Exposed Structures: Fascia: No N/A N/A Fat: No Tendon: No Muscle: No Joint: No Bone: No Limited to Skin Breakdown Epithelialization: None N/A N/A Periwound Skin Texture: Edema: No N/A N/A Excoriation: No Induration: No Callus: No Crepitus: No Fluctuance: No Friable: No Rash: No Scarring: No Periwound Skin Moist: Yes N/A N/A Moisture: Maceration: No Dry/Scaly: No Periwound Skin Color: Hemosiderin Staining: Yes N/A N/A Atrophie Blanche: No Cyanosis: No Ecchymosis:  No Erythema: No Mottled: No Pallor: No Rubor: No Temperature: No Abnormality N/A N/A Tenderness on Yes N/A N/A Palpation: Wound Preparation: Ulcer Cleansing: N/A N/A Rinsed/Irrigated with Saline Topical Anesthetic Applied: Other: lidocaine 4% Treatment Notes Electronic Signature(s) Signed: 05/06/2015 5:27:11 PM By: [name] Entered By: [name] on 05/06/2015 13:19:43 [name], [name] ([id number]) -------------------------------------------------------------------------------- Multi-Disciplinary Care Plan Details Patient Name: [name], [name] Date of Service: 05/06/2015 1:00 PM Medical Record Number: [id number] Patient Account Number: [id number] Date of Birth/Sex: Nov 24, 1945 (68 y.o. Female) Treating RN: [name] Primary Care Physician: Glendon Axe Other Clinician: Referring Physician: Glendon Axe Treating Physician/Extender: Frann Rider in Treatment: 5 Active Inactive Orientation to the Wound Care Program Nursing Diagnoses: Knowledge deficit related to the wound healing center program Goals: Patient/caregiver will verbalize understanding of the [facility] Date Initiated: 01/31/2015 Goal Status: Active Interventions: Provide education on orientation to the wound center Notes: Venous Leg Ulcer Nursing Diagnoses: Potential for venous Insuffiency (use before diagnosis confirmed) Goals: Non-invasive venous studies are completed as ordered Date Initiated: 01/31/2015 Goal Status: Active Patient/caregiver will verbalize understanding of disease process and disease management Date Initiated: 01/31/2015 Goal Status: Active Interventions: Assess peripheral edema status every visit. Notes: Wound/Skin Impairment Nursing Diagnoses: Impaired tissue integrity Knowledge deficit related to smoking impact on wound healing [name], [name] ([id number]) Goals: Patient/caregiver will verbalize understanding of skin care regimen Date  Initiated: 01/31/2015 Goal Status: Active Ulcer/skin breakdown will heal within 14 weeks Date Initiated: 01/31/2015 Goal Status: Active Interventions: Assess ulceration(s) every visit Notes: Electronic Signature(s) Signed: 05/06/2015 5:27:11 PM By: [name] Entered By: [name] on 05/06/2015 13:19:37 [name], [name] ([id number]) -------------------------------------------------------------------------------- Patient/Caregiver Education Details Patient Name: Kristin Stare D. Date of Service: 05/06/2015 1:00 PM Medical Record Number: [id number] Patient Account Number: [id number] Date of Birth/Gender: 31-May-1946 (68 y.o. Female) Treating RN: [name] Primary Care Physician: Glendon Axe Other Clinician: Referring Physician: Glendon Axe Treating Physician/Extender: Frann Rider in Treatment: 13 Education Assessment Education Provided To: Patient Education Topics Provided Wound/Skin Impairment: Handouts: Other: wound care to continue as ordered Methods: Demonstration, Explain/Verbal Responses: State content correctly Electronic Signature(s) Signed: 05/06/2015 5:27:11 PM By: [name] Entered By: [name] on 05/06/2015 13:39:40 [name], [name] ([id number]) -------------------------------------------------------------------------------- Wound Assessment Details Patient Name: Kristin Stare D. Date of Service: 05/06/2015 1:00 PM Medical Record Number: [id number] Patient Account Number: [id number] Date of Birth/Sex: September 11, 1946 (68 y.o. Female) Treating RN: [name] Primary Care Physician: Glendon Axe Other Clinician: Referring Physician: Glendon Axe Treating Physician/Extender: Frann Rider in Treatment: 13 Wound Status Wound Number: 2 Primary Venous Leg Ulcer Etiology: Wound Location: Left Lower Leg - Distal Wound Open Wounding Event: Blister Status: Date Acquired: 12/30/2014 Comorbid Cataracts, Asthma, Coronary Artery Weeks  Of Treatment: 13 History: Disease,  Hypertension, Type II Clustered Wound: Yes Diabetes, Osteoarthritis, Neuropathy Photos Photo Uploaded By: [name] on 05/06/2015 17:24:22 Wound Measurements Length: (cm) 6.2 Width: (cm) 3.7 Depth: (cm) 0.5 Area: (cm) 18.017 Volume: (cm) 9.009 % Reduction in Area: -2448.4% % Reduction in Volume: -12588.7% Epithelialization: None Tunneling: No Undermining: No Wound Description Full Thickness Without Exposed Foul Odor A Classification: Support Structures Diabetic Severity Grade 1 ([place]agner): Wound Margin: Distinct, outline attached Exudate Amount: Medium Exudate Type: Serosanguineous Exudate Color: red, brown fter Cleansing: No Wound Bed Granulation Amount: None Present (0%) Exposed Structure [name], [name] ([id number]) Necrotic Amount: Large (67-100%) Fascia Exposed: No Necrotic Quality: Eschar, Adherent Slough Fat Layer Exposed: No Tendon Exposed: No Muscle Exposed: No Joint Exposed: No Bone Exposed: No Limited to Skin Breakdown Periwound Skin Texture Texture Color No Abnormalities Noted: No No Abnormalities Noted: No Callus: No Atrophie Blanche: No Crepitus: No Cyanosis: No Excoriation: No Ecchymosis: No Fluctuance: No Erythema: No Friable: No Hemosiderin Staining: Yes Induration: No Mottled: No Localized Edema: No Pallor: No Rash: No Rubor: No Scarring: No Temperature / Pain Moisture Temperature: No Abnormality No Abnormalities Noted: No Tenderness on Palpation: Yes Dry / Scaly: No Maceration: No Moist: Yes Wound Preparation Ulcer Cleansing: Rinsed/Irrigated with Saline Topical Anesthetic Applied: Other: lidocaine 4%, Treatment Notes Wound #2 (Left, Distal Lower Leg) 1. Cleansed with: Clean wound with Normal Saline 2. Anesthetic Topical Lidocaine 4% cream to wound bed prior to debridement 4. Dressing Applied: Santyl Ointment 5. Secondary Dressing Applied ABD and Kerlix/Conform 7. Secured  with Tape Support Garment 20-30 mm/Hg pressure to: Electronic Signature(s) Signed: 05/06/2015 5:27:11 PM By: [name], [name] ([id number]) Entered By: [name] on 05/06/2015 13:18:15 [name], [name] ([id number]) -------------------------------------------------------------------------------- Vitals Details Patient Name: [name], AFFINITO D. Date of Service: 05/06/2015 1:00 PM Medical Record Number: [id number] Patient Account Number: [id number] Date of Birth/Sex: Jan 12, 1946 (68 y.o. Female) Treating RN: [name] Primary Care Physician: Glendon Axe Other Clinician: Referring Physician: Glendon Axe Treating Physician/Extender: Frann Rider in Treatment: 13 Vital Signs Time Taken: 13:15 Temperature (F): 98.3 Height (in): 65 Pulse (bpm): 67 Weight (lbs): 248 Respiratory Rate (breaths/min): 18 Body Mass Index (BMI): 41.3 Blood Pressure (mmHg): 127/53 Capillary Blood Glucose (mg/dl): 97 Reference Range: 80 - 120 mg / dl Electronic Signature(s) Signed: 05/06/2015 5:27:11 PM By: [name] Entered By: [name] on 05/06/2015 13:16:55

## 2015-05-08 ENCOUNTER — Ambulatory Visit (INDEPENDENT_AMBULATORY_CARE_PROVIDER_SITE_OTHER): Payer: PPO | Admitting: General Surgery

## 2015-05-08 ENCOUNTER — Encounter: Payer: Self-pay | Admitting: General Surgery

## 2015-05-08 VITALS — BP 160/70 | HR 74 | Resp 16 | Ht 65.0 in | Wt 232.0 lb

## 2015-05-08 DIAGNOSIS — L97929 Non-pressure chronic ulcer of unspecified part of left lower leg with unspecified severity: Principal | ICD-10-CM

## 2015-05-08 DIAGNOSIS — I83029 Varicose veins of left lower extremity with ulcer of unspecified site: Secondary | ICD-10-CM

## 2015-05-08 DIAGNOSIS — I87312 Chronic venous hypertension (idiopathic) with ulcer of left lower extremity: Secondary | ICD-10-CM

## 2015-05-08 NOTE — Progress Notes (Signed)
Patient ID: [name], female   DOB: 27-Oct-1945, 70 y.o.   MRN: [id number]  Chief Complaint  Patient presents with  . Follow-up    left leg ulcer    HPI [name] is a 69 y.o. female following up here today for a venous ulcer.Patient states she is changing the bandage daily. 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 On follow up reportedly the vein was not closed.. She is due to have the ablation procedure repeated.  HPI  Past Medical History  Diagnosis Date  . Diabetes mellitus without complication     Type II  . Ulcer of left lower leg February 07, 2015  . Chronic renal insufficiency February 07, 2015  . Coronary artery dilation   . Hypertension   . Hyperlipidemia   . Arthritis     Inflammatory  . Thyroid disease   . Sinus problem     Past Surgical History  Procedure Laterality Date  . Abdominal hysterectomy    . Dilation and curettage of uterus  1984  . Partial hysterectomy  1985  . Rotator cuff repair  2004  . Cyst excision    . Cataract extraction w/phaco Right 03/11/2015    Procedure: CATARACT EXTRACTION PHACO AND INTRAOCULAR LENS PLACEMENT (IOC);  Surgeon: Birder Robson, MD;  Location: [facility] ORS;  Service: Ophthalmology;  Laterality: Right;  [place] 00:40 AP% 24.3 CDE 9.86    History reviewed. No pertinent family history.  Social History History  Substance Use Topics  . Smoking status: Former Smoker -- 6 years  . Smokeless tobacco: Never Used  . Alcohol Use: No    Allergies  Allergen Reactions  . Ciprofloxacin     Causes joint pain  . Doxycycline   . Levaquin [Levofloxacin]     Causes joint pain  . Penicillins Rash  . Sulfa Antibiotics Swelling and Rash    Current Outpatient Prescriptions  Medication Sig Dispense Refill  . acetaminophen (TYLENOL) 500 MG tablet Take 500 mg by mouth every 6 (six) hours as needed.    Marland Kitchen amLODipine (NORVASC) 5 MG tablet Take 5 mg by mouth daily.    Marland Kitchen aspirin 81 MG tablet Take 81 mg by mouth daily.     Marland Kitchen atorvastatin (LIPITOR) 40 MG tablet Take 40 mg by mouth daily at 6 PM.    . Calcium Carb-Cholecalciferol (CALCIUM + D3 PO) Take by mouth daily.    . furosemide (LASIX) 20 MG tablet Take 40 mg by mouth 2 (two) times daily.    . insulin aspart (NOVOLOG) 100 UNIT/ML injection Inject into the skin 3 (three) times daily with meals.    . insulin glargine (LANTUS) 100 UNIT/ML injection Inject 50 Units into the skin at bedtime.    Marland Kitchen loratadine (CLARITIN) 10 MG tablet Take 10 mg by mouth daily.    . Magnesium 400 MG CAPS Take by mouth 2 (two) times daily.    Marland Kitchen omeprazole (PRILOSEC) 20 MG capsule Take 20 mg by mouth daily.    . potassium chloride (K-DUR) 10 MEQ tablet Take 10 mEq by mouth daily.    Marland Kitchen PREDNISOLONE PO Take 15 mg by mouth daily.     No current facility-administered medications for this visit.    Review of Systems Review of Systems  Blood pressure 160/70, pulse 74, resp. rate 16, height 5\' 5"  (1.651 m), weight 232 lb (105.235 kg).  Physical Exam Physical Exam Leg ulcer- measures about 7 by 3 cm. Prior scabbed areas have  come off. Base appears covered with fibrinous exudate. No signs of infection. Ulcer edges are clean. Data Reviewed Prior notes.  Assessment    Stasis ulcer with poor healing-due to superficial venous insufficiency and steroid use. Hopefully the repeat ablation of left GSV will be successful. Will reassess after that is completed.     Plan    Follow up appointment to be announced.   PCP: [name] 05/09/2015, 8:11 AM

## 2015-05-09 ENCOUNTER — Encounter: Payer: Self-pay | Admitting: General Surgery

## 2015-05-09 NOTE — Patient Instructions (Signed)
Call after she has repeat ablation of the vein on left thigh

## 2015-05-10 ENCOUNTER — Emergency Department
Admission: EM | Admit: 2015-05-10 | Discharge: 2015-05-10 | Disposition: A | Discharge status: Home / Self Care | Payer: PPO | Attending: Emergency Medicine | Admitting: Emergency Medicine

## 2015-05-10 ENCOUNTER — Encounter: Payer: Self-pay | Admitting: *Deleted

## 2015-05-10 DIAGNOSIS — E119 Type 2 diabetes mellitus without complications: Secondary | ICD-10-CM | POA: Diagnosis not present

## 2015-05-10 DIAGNOSIS — I129 Hypertensive chronic kidney disease with stage 1 through stage 4 chronic kidney disease, or unspecified chronic kidney disease: Secondary | ICD-10-CM | POA: Insufficient documentation

## 2015-05-10 DIAGNOSIS — T368X5A Adverse effect of other systemic antibiotics, initial encounter: Secondary | ICD-10-CM | POA: Insufficient documentation

## 2015-05-10 DIAGNOSIS — Z794 Long term (current) use of insulin: Secondary | ICD-10-CM | POA: Insufficient documentation

## 2015-05-10 DIAGNOSIS — Z7982 Long term (current) use of aspirin: Secondary | ICD-10-CM | POA: Insufficient documentation

## 2015-05-10 DIAGNOSIS — T7840XA Allergy, unspecified, initial encounter: Secondary | ICD-10-CM

## 2015-05-10 DIAGNOSIS — Z87891 Personal history of nicotine dependence: Secondary | ICD-10-CM | POA: Diagnosis not present

## 2015-05-10 DIAGNOSIS — L509 Urticaria, unspecified: Secondary | ICD-10-CM

## 2015-05-10 DIAGNOSIS — N189 Chronic kidney disease, unspecified: Secondary | ICD-10-CM | POA: Insufficient documentation

## 2015-05-10 DIAGNOSIS — L97929 Non-pressure chronic ulcer of unspecified part of left lower leg with unspecified severity: Secondary | ICD-10-CM | POA: Insufficient documentation

## 2015-05-10 DIAGNOSIS — L500 Allergic urticaria: Secondary | ICD-10-CM | POA: Insufficient documentation

## 2015-05-10 DIAGNOSIS — Z79899 Other long term (current) drug therapy: Secondary | ICD-10-CM | POA: Insufficient documentation

## 2015-05-10 DIAGNOSIS — Z880 Allergy status to penicillin: Secondary | ICD-10-CM | POA: Insufficient documentation

## 2015-05-10 LAB — WOUND CULTURE

## 2015-05-10 MED ORDER — CEPHALEXIN 500 MG PO CAPS: 500.0000 mg | ORAL_CAPSULE | Freq: Four times a day (QID) | ORAL | Status: DC

## 2015-05-10 MED ORDER — FAMOTIDINE 20 MG PO TABS: 20.0000 mg | ORAL_TABLET | Freq: Two times a day (BID) | ORAL | Status: DC

## 2015-05-10 MED ORDER — PREDNISONE 20 MG PO TABS: ORAL_TABLET | ORAL | Status: DC

## 2015-05-10 MED ORDER — PREDNISONE 20 MG PO TABS
40.0000 mg | ORAL_TABLET | Freq: Once | ORAL | Status: AC
  Administered 2015-05-10: 40 mg via ORAL
  Filled 2015-05-10: qty 2

## 2015-05-10 MED ORDER — FAMOTIDINE 20 MG PO TABS
20.0000 mg | ORAL_TABLET | Freq: Once | ORAL | Status: AC
  Administered 2015-05-10: 20 mg via ORAL
  Filled 2015-05-10: qty 1

## 2015-05-10 MED ORDER — DIPHENHYDRAMINE HCL 50 MG PO CAPS
50.0000 mg | ORAL_CAPSULE | Freq: Once | ORAL | Status: AC
Start: 2015-05-10 — End: 2015-05-10
  Administered 2015-05-10: 50 mg via ORAL
  Filled 2015-05-10: qty 1

## 2015-05-10 NOTE — ED Provider Notes (Signed)
[facility] Emergency Department Provider Note  ____________________________________________  Time seen: Approximately 2:23 AM  I have reviewed the triage vital signs and the nursing notes.   HISTORY  Chief Complaint Allergic Reaction    HPI MCKINLEE DUNK is a 69 y.o. female who presents with acute allergic reaction to clindamycin. Patient states she was started on clindamycin for left lower extremity wound. She took her first dose yesterday morning, noted some itching which resolved by itself. After taking her second dose of clindamycin tonight approximately 8:30 PM, patient began to itch on her face and neck and arms with hives. Denies throat constriction, tongue or facial swelling or respiratory distress.   Past Medical History  Diagnosis Date  . Diabetes mellitus without complication     Type II  . Ulcer of left lower leg February 07, 2015  . Chronic renal insufficiency February 07, 2015  . Coronary artery dilation   . Hypertension   . Hyperlipidemia   . Arthritis     Inflammatory  . Thyroid disease   . Sinus problem     There are no active problems to display for this patient.   Past Surgical History  Procedure Laterality Date  . Abdominal hysterectomy    . Dilation and curettage of uterus  1984  . Partial hysterectomy  1985  . Rotator cuff repair  2004  . Cyst excision    . Cataract extraction w/phaco Right 03/11/2015    Procedure: CATARACT EXTRACTION PHACO AND INTRAOCULAR LENS PLACEMENT (IOC);  Surgeon: Birder Robson, MD;  Location: [facility] ORS;  Service: Ophthalmology;  Laterality: Right;  [place] 00:40 AP% 24.3 CDE 9.86    Current Outpatient Rx  Name  Route  Sig  Dispense  Refill  . hydrochlorothiazide (HYDRODIURIL) 25 MG tablet   Oral   Take 25 mg by mouth daily.         Marland Kitchen acetaminophen (TYLENOL) 500 MG tablet   Oral   Take 500 mg by mouth every 6 (six) hours as needed.         Marland Kitchen amLODipine (NORVASC) 5 MG tablet   Oral   Take 5  mg by mouth daily.         Marland Kitchen aspirin 81 MG tablet   Oral   Take 81 mg by mouth daily.         Marland Kitchen atorvastatin (LIPITOR) 40 MG tablet   Oral   Take 40 mg by mouth daily at 6 PM.         . Calcium Carb-Cholecalciferol (CALCIUM + D3 PO)   Oral   Take by mouth daily.         . furosemide (LASIX) 20 MG tablet   Oral   Take 40 mg by mouth 2 (two) times daily.         . insulin aspart (NOVOLOG) 100 UNIT/ML injection   Subcutaneous   Inject into the skin 3 (three) times daily with meals.         . insulin glargine (LANTUS) 100 UNIT/ML injection   Subcutaneous   Inject 50 Units into the skin at bedtime.         Marland Kitchen loratadine (CLARITIN) 10 MG tablet   Oral   Take 10 mg by mouth daily.         . Magnesium 400 MG CAPS   Oral   Take by mouth 2 (two) times daily.         Marland Kitchen omeprazole (PRILOSEC) 20 MG capsule  Oral   Take 20 mg by mouth daily.         . potassium chloride (K-DUR) 10 MEQ tablet   Oral   Take 10 mEq by mouth daily.         Marland Kitchen PREDNISOLONE PO   Oral   Take 15 mg by mouth daily.           Allergies Ciprofloxacin; Doxycycline; Levaquin; Clindamycin/lincomycin; Penicillins; and Sulfa antibiotics  No family history on file.  Social History History  Substance Use Topics  . Smoking status: Former Smoker -- 61 years  . Smokeless tobacco: Never Used  . Alcohol Use: No    Review of Systems Constitutional: No fever/chills Eyes: No visual changes. ENT: No sore throat. Cardiovascular: Denies chest pain. Respiratory: Denies shortness of breath. Gastrointestinal: No abdominal pain.  No nausea, no vomiting.  No diarrhea.  No constipation. Genitourinary: Negative for dysuria. Musculoskeletal: Positive for left leg ulcer. Negative for back pain. Skin: Positive for hives. Neurological: Negative for headaches, focal weakness or numbness.  10-point ROS otherwise negative.  ____________________________________________   PHYSICAL  EXAM:  VITAL SIGNS: ED Triage Vitals  Enc Vitals Group     BP 05/10/15 0056 194/84 mmHg     Pulse Rate 05/10/15 0056 80     Resp 05/10/15 0056 16     Temp 05/10/15 0056 97.6 F (36.4 C)     Temp Source 05/10/15 0056 Oral     SpO2 05/10/15 0056 98 %     Weight 05/10/15 0056 240 lb (108.863 kg)     Height 05/10/15 0056 5\' 5"  (1.651 m)     Head Cir --      Peak Flow --      Pain Score --      Pain Loc --      Pain Edu? --      Excl. in [place]? --     Constitutional: Alert and oriented. Well appearing and in no acute distress. Eyes: Conjunctivae are normal. PERRL. EOMI. Head: Atraumatic. Nose: No congestion/rhinnorhea. Mouth/Throat: Mucous membranes are moist.  Oropharynx non-erythematous. There is no angioedema or tongue swelling. Patient is tolerating secretions well. There is no hoarse or muffled voice. Neck: No stridor.   Cardiovascular: Normal rate, regular rhythm. Grossly normal heart sounds.  Good peripheral circulation. Respiratory: Normal respiratory effort.  No retractions. Lungs CTAB. Gastrointestinal: Soft and nontender. No distention. No abdominal bruits. No CVA tenderness. Musculoskeletal: Left anterior shin with approximately 5 x 3 cm shallow ulceration with small area of necrosis at distal aspect with purulent-appearing discharge. Palpable pulses. Supple and nontender calf. Symmetrically warm extremities. Neurologic:  Normal speech and language. No gross focal neurologic deficits are appreciated. No gait instability. Skin:  Skin is warm, dry and intact. No rash noted. Psychiatric: Mood and affect are normal. Speech and behavior are normal.  ____________________________________________   LABS (all labs ordered are listed, but only abnormal results are displayed)  Labs Reviewed - No data to  display ____________________________________________  EKG  None ____________________________________________  RADIOLOGY  None ____________________________________________   PROCEDURES  Procedure(s) performed: None  Critical Care performed: No  ____________________________________________   INITIAL IMPRESSION / ASSESSMENT AND PLAN / ED COURSE  Pertinent labs & imaging results that were available during my care of the patient were reviewed by me and considered in my medical decision making (see chart for details).  69 year old female with acute allergic reaction to clindamycin; no angioedema or tongue swelling noted. Will treat with Benadryl, Pepcid, prednisone. 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 After discussion with patient, we will do a burst of 40 mg daily 5 days (patient is unwilling to take a higher dose).  ----------------------------------------- 3:20 AM on 05/10/2015 -----------------------------------------  Patient's leg dressing was undressed to examine ulceration. Will obtain wound culture. Extensive discussion with patient and daughter - Will write a prescription for Keflex; patient will call her doctor tomorrow to determine whether or not to start Keflex. If wound culture is positive for MRSA then the patient will need to discuss appropriate antibiotics treatment to cover MRSA while tempering patient's intolerance to multiple antibiotics. Strict return precautions given. Patient and daughter verbalize understanding and agree with plan of care. ____________________________________________   FINAL CLINICAL IMPRESSION(S) / ED DIAGNOSES  Final diagnoses:  Allergic reaction caused by a drug  Hives      Paulette Blanch, MD 05/11/15 [phone]

## 2015-05-10 NOTE — ED Notes (Signed)
Pt states that she received a rx for Clindamycin for ulcerative wound on her leg. When she took her pill this morning she had some itching that went away on its own.  After taking her pill tonight around 8:30, she started itching on her face, neck and arms. No acute distress or sob noted

## 2015-05-10 NOTE — Discharge Instructions (Signed)
1. Continue prednisone 40 mg daily for the next 4 days, then resume your usual daily dose. 2. Take Pepcid 20 mg twice daily for the next 4 days. 3. Take Benadryl as needed for rash/itching. 4. Wound culture is pending. You be notified of any positive results. 5. Return to the ER for worsening symptoms, facial swelling, difficulty breathing or other concerns.  Allergies Allergies may happen from anything your body is sensitive to. This may be food, medicines, pollens, chemicals, and nearly anything around you in everyday life that produces allergens. An allergen is anything that causes an allergy producing substance. Heredity is often a factor in causing these problems. This means you may have some of the same allergies as your parents. Food allergies happen in all age groups. Food allergies are some of the most severe and life threatening. Some common food allergies are cow's milk, seafood, eggs, nuts, wheat, and soybeans. SYMPTOMS   Swelling around the mouth.  An itchy red rash or hives.  Vomiting or diarrhea.  Difficulty breathing. SEVERE ALLERGIC REACTIONS ARE LIFE-THREATENING. This reaction is called anaphylaxis. It can cause the mouth and throat to swell and cause difficulty with breathing and swallowing. In severe reactions only a trace amount of food (for example, peanut oil in a salad) may cause death within seconds. Seasonal allergies occur in all age groups. These are seasonal because they usually occur during the same season every year. They may be a reaction to molds, grass pollens, or tree pollens. Other causes of problems are house dust mite allergens, pet dander, and mold spores. The symptoms often consist of nasal congestion, a runny itchy nose associated with sneezing, and tearing itchy eyes. There is often an associated itching of the mouth and ears. The problems happen when you come in contact with pollens and other allergens. Allergens are the particles in the air that the  body reacts to with an allergic reaction. This causes you to release allergic antibodies. Through a chain of events, these eventually cause you to release histamine into the blood stream. Although it is meant to be protective to the body, it is this release that causes your discomfort. This is why you were given anti-histamines to feel better. If you are unable to pinpoint the offending allergen, it may be determined by skin or blood testing. Allergies cannot be cured but can be controlled with medicine. Hay fever is a collection of all or some of the seasonal allergy problems. It may often be treated with simple over-the-counter medicine such as diphenhydramine. Take medicine as directed. Do not drink alcohol or drive while taking this medicine. Check with your caregiver or package insert for child dosages. If these medicines are not effective, there are many new medicines your caregiver can prescribe. Stronger medicine such as nasal spray, eye drops, and corticosteroids may be used if the first things you try do not work well. Other treatments such as immunotherapy or desensitizing injections can be used if all else fails. Follow up with your caregiver if problems continue. These seasonal allergies are usually not life threatening. They are generally more of a nuisance that can often be handled using medicine. HOME CARE INSTRUCTIONS   If unsure what causes a reaction, keep a diary of foods eaten and symptoms that follow. Avoid foods that cause reactions.  If hives or rash are present:  Take medicine as directed.  You may use an over-the-counter antihistamine (diphenhydramine) for hives and itching as needed.  Apply cold compresses (cloths) to the  skin or take baths in cool water. Avoid hot baths or showers. Heat will make a rash and itching worse.  If you are severely allergic:  Following a treatment for a severe reaction, hospitalization is often required for closer follow-up.  Wear a  medic-alert bracelet or necklace stating the allergy.  You and your family must learn how to give adrenaline or use an anaphylaxis kit.  If you have had a severe reaction, always carry your anaphylaxis kit or EpiPen with you. Use this medicine as directed by your caregiver if a severe reaction is occurring. Failure to do so could have a fatal outcome. SEEK MEDICAL CARE IF:  You suspect a food allergy. Symptoms generally happen within 30 minutes of eating a food.  Your symptoms have not gone away within 2 days or are getting worse.  You develop new symptoms.  You want to retest yourself or your child with a food or drink you think causes an allergic reaction. Never do this if an anaphylactic reaction to that food or drink has happened before. Only do this under the care of a caregiver. SEEK IMMEDIATE MEDICAL CARE IF:   You have difficulty breathing, are wheezing, or have a tight feeling in your chest or throat.  You have a swollen mouth, or you have hives, swelling, or itching all over your body.  You have had a severe reaction that has responded to your anaphylaxis kit or an EpiPen. These reactions may return when the medicine has worn off. These reactions should be considered life threatening. MAKE SURE YOU:   Understand these instructions.  Will watch your condition.  Will get help right away if you are not doing well or get worse. Document Released: 01/04/2003 Document Revised: 02/05/2013 Document Reviewed: 06/10/2008 [facility] Patient Information 2015 [place], [place]. This information is not intended to replace advice given to you by your health care provider. Make sure you discuss any questions you have with your health care provider.  Drug Allergy A drug allergy means you have a strange reaction to a medicine. You may have puffiness (swelling), itching, red rashes, and hives. Some allergic reactions can be life-threatening. HOME CARE  If you do not know what caused your  reaction:  Write down medicines you use.  Write down any problems you have after using medicine.  Avoid things that cause a reaction.  You can see an allergy doctor to be tested for allergies. If you have hives or a rash:  Take medicine as told by your doctor.  Place cold cloths on your skin.  Do not take hot baths or hot showers. Take baths in cool water. If you are severely allergic:  Wear a medical bracelet or necklace that lists your allergy.  Carry your allergy kit or medicine shot to treat severe allergic reactions with you. These can save your life.  Do not drive until medicine from your shot has worn off, unless your doctor says it is okay. GET HELP RIGHT AWAY IF:   Your mouth is puffy, or you have trouble breathing.  You have a tight feeling in your chest or throat.  You have hives, puffiness, or itching all over your body.  You throw up (vomit) or have watery poop (diarrhea).  You feel dizzy or pass out (faint).  You think you are having a reaction. Problems often start within 30 minutes after taking a medicine.  You are getting worse, not better.  You have new problems.  Your problems go away and then  come back. This is an emergency. Use your medicine shot or allergy kit as told. Call yourlocal emergency services (911 in [place] after the shot. Even if you feel better after the shot, you need to go to the hospital. You may need more medicine to control a severe reaction. MAKE SURE YOU:  Understand these instructions.  Will watch your condition.  Will get help right away if you are not doing well or get worse. Document Released: 11/18/2004 Document Revised: 01/03/2012 Document Reviewed: 04/08/2011 [facility] Patient Information 2015 [place], [place]. This information is not intended to replace advice given to you by your health care provider. Make sure you discuss any questions you have with your health care provider.

## 2015-05-13 ENCOUNTER — Encounter: Payer: PPO | Admitting: Surgery

## 2015-05-13 DIAGNOSIS — L97321 Non-pressure chronic ulcer of left ankle limited to breakdown of skin: Secondary | ICD-10-CM | POA: Diagnosis not present

## 2015-05-13 NOTE — Progress Notes (Signed)
LEGEND, PECORE ([id number]) Visit Report for 05/13/2015 Allergy List Details Patient Name: [name], [name] Date of Service: 05/13/2015 9:30 AM Medical Record Number: [id number] Patient Account Number: [id number] Date of Birth/Sex: 1946-01-31 (68 y.o. Female) Treating RN: Baruch Gouty, RN, [name], [name] Primary Care Physician: Glendon Axe Other Clinician: Referring Physician: Glendon Axe Treating Physician/Extender: Frann Rider in Treatment: 14 Allergies Active Allergies Cipro Levaquin penicillin Sulfa (Sulfonamide Antibiotics) doxycycline clindamycin Reaction: itching, breathing problems Severity: Severe Allergy Notes Electronic Signature(s) Signed: 05/13/2015 3:00:11 PM By: Regan Lemming [name], RN Entered By: Regan Lemming on 05/13/2015 10:08:47 [name], [name] ([id number]) -------------------------------------------------------------------------------- Arrival Information Details Patient Name: [name], [name] Date of Service: 05/13/2015 9:30 AM Medical Record Number: [id number] Patient Account Number: [id number] Date of Birth/Sex: [birth year] (68 y.o. Female) Treating RN: [name], RN, [name], [name] Primary Care Physician: Glendon Axe Other Clinician: Referring Physician: Glendon Axe Treating Physician/Extender: Frann Rider in Treatment: 14 Visit Information Patient Arrived: Cane Arrival Time: 09:39 Accompanied By: SELF Transfer Assistance: None Patient Identification Verified: Yes Secondary Verification Process Yes Completed: Patient Has Alerts: Yes Patient Alerts: Patient on Blood Thinner History Since Last Visit Any new allergies or adverse reactions: No Had a fall or experienced change in activities of daily living that may affect risk of falls: No Signs or symptoms of abuse/neglect since last visito No Hospitalized since last visit: No Has Dressing in Place as Prescribed: Yes Electronic Signature(s) Signed: 05/13/2015 3:00:11 PM By: Regan Lemming [name],  RN Entered By: Regan Lemming on 05/13/2015 09:39:38 [name], [name] ([id number]) -------------------------------------------------------------------------------- Encounter Discharge Information Details Patient Name: [name], [name] Date of Service: 05/13/2015 9:30 AM Medical Record Number: [id number] Patient Account Number: [id number] Date of Birth/Sex: [birth year] (68 y.o. Female) Treating RN: Baruch Gouty, RN, [name], [name] Primary Care Physician: Glendon Axe Other Clinician: Referring Physician: Glendon Axe Treating Physician/Extender: Frann Rider in Treatment: 14 Encounter Discharge Information Items Discharge Pain Level: 0 Discharge Condition: Stable Ambulatory Status: Cane Discharge Destination: Home Private Transportation: Auto Accompanied By: self Schedule Follow-up Appointment: No Medication Reconciliation completed and No provided to Patient/Care Rogerio Boutelle: Clinical Summary of Care: Electronic Signature(s) Signed: 05/13/2015 3:00:11 PM By: Regan Lemming [name], RN Entered By: Regan Lemming on 05/13/2015 10:33:03 [name], [name] ([id number]) -------------------------------------------------------------------------------- Lower Extremity Assessment Details Patient Name: Kristin Stare D. Date of Service: 05/13/2015 9:30 AM Medical Record Number: [id number] Patient Account Number: [id number] Date of Birth/Sex: 08-09-1946 (68 y.o. Female) Treating RN: [name], RN, [name], [employer] Primary Care Physician: Glendon Axe Other Clinician: Referring Physician: Glendon Axe Treating Physician/Extender: Frann Rider in Treatment: 14 Edema Assessment Assessed: [Left: No] [Right: No] E[Left: dema] [Right: :] Calf Left: Right: Point of Measurement: 33 cm From Medial Instep 38.3 cm cm Ankle Left: Right: Point of Measurement: 12 cm From Medial Instep 23.2 cm cm Vascular Assessment Pulses: Posterior Tibial Dorsalis Pedis Palpable: [Left:Yes] Extremity colors, hair growth, and  conditions: Extremity Color: [Left:Mottled] Hair Growth on Extremity: [Left:Yes] Temperature of Extremity: [Left:Warm] Capillary Refill: [Left:< 3 seconds] Dependent Rubor: [Left:No] Blanched when Elevated: [Left:No] Lipodermatosclerosis: [Left:No] Toe Nail Assessment Left: Right: Thick: No Discolored: No Deformed: No Improper Length and Hygiene: No Electronic Signature(s) Signed: 05/13/2015 3:00:11 PM By: Regan Lemming [name], RN [name], [name] ([id number]) Entered By: Regan Lemming on 05/13/2015 09:44:28 [name], [name] ([id number]) -------------------------------------------------------------------------------- Multi Wound Chart Details Patient Name: Kristin Stare D. Date of Service: 05/13/2015 9:30 AM Medical Record Number: [id number] Patient Account Number: [id number] Date of Birth/Sex: 1946/05/10 (68 y.o. Female) Treating RN: [name], RN, [name], [facility]  Physician: Glendon Axe Other Clinician: Referring Physician: Glendon Axe Treating Physician/Extender: Frann Rider in Treatment: 14 Vital Signs Height(in): 65 Pulse(bpm): 77 Weight(lbs): 248 Blood Pressure 158/64 (mmHg): Body Mass Index(BMI): 41 Temperature(F): 97.7 Respiratory Rate 18 (breaths/min): Photos: [2:No Photos] [N/A:N/A] Wound Location: [2:Left Lower Leg - Distal] [N/A:N/A] Wounding Event: [2:Blister] [N/A:N/A] Primary Etiology: [2:Venous Leg Ulcer] [N/A:N/A] Comorbid History: [2:Cataracts, Asthma, Coronary Artery Disease, Hypertension, Type II Diabetes, Osteoarthritis, Neuropathy] [N/A:N/A] Date Acquired: [2:12/30/2014] [N/A:N/A] Weeks of Treatment: [2:14] [N/A:N/A] Wound Status: [2:Open] [N/A:N/A] Clustered Wound: [2:Yes] [N/A:N/A] Measurements L x W x D 6.3x4x0.5 [N/A:N/A] (cm) Area (cm) : [2:19.792] [N/A:N/A] Volume (cm) : [2:9.896] [N/A:N/A] % Reduction in Area: [2:-2699.40%] [N/A:N/A] % Reduction in Volume: -13838.00% [N/A:N/A] Classification: [2:Full Thickness Without Exposed  Support Structures] [N/A:N/A] HBO Classification: [2:Grade 1] [N/A:N/A] Exudate Amount: [2:Medium] [N/A:N/A] Exudate Type: [2:Serosanguineous] [N/A:N/A] Exudate Color: [2:red, brown] [N/A:N/A] Wound Margin: [2:Distinct, outline attached] [N/A:N/A] Granulation Amount: [2:None Present (0%)] [N/A:N/A] Necrotic Amount: [2:Large (67-100%)] [N/A:N/A] Necrotic Tissue: [2:Eschar, Adherent Slough] [N/A:N/A] Exposed Structures: Fascia: No N/A N/A Fat: No Tendon: No Muscle: No Joint: No Bone: No Limited to Skin Breakdown Epithelialization: None N/A N/A Periwound Skin Texture: Edema: Yes N/A N/A Excoriation: No Induration: No Callus: No Crepitus: No Fluctuance: No Friable: No Rash: No Scarring: No Periwound Skin Moist: Yes N/A N/A Moisture: Maceration: No Dry/Scaly: No Periwound Skin Color: Hemosiderin Staining: Yes N/A N/A Atrophie Blanche: No Cyanosis: No Ecchymosis: No Erythema: No Mottled: No Pallor: No Rubor: No Temperature: No Abnormality N/A N/A Tenderness on Yes N/A N/A Palpation: Wound Preparation: Ulcer Cleansing: N/A N/A Rinsed/Irrigated with Saline Topical Anesthetic Applied: Other: lidocaine 4% Treatment Notes Electronic Signature(s) Signed: 05/13/2015 3:00:11 PM By: Regan Lemming [name], RN Entered By: Regan Lemming on 05/13/2015 10:09:01 [name], Kristin LINGO ([id number]) -------------------------------------------------------------------------------- [place] Details Patient Name: MERLY, HINKSON D. Date of Service: 05/13/2015 9:30 AM Medical Record Number: [id number] Patient Account Number: [id number] Date of Birth/Sex: 1946/03/02 (68 y.o. Female) Treating RN: [name], RN, [name], [name] Primary Care Physician: Glendon Axe Other Clinician: Referring Physician: Glendon Axe Treating Physician/Extender: Frann Rider in Treatment: 14 Active Inactive Orientation to the Wound Care Program Nursing Diagnoses: Knowledge deficit related to the  wound healing center program Goals: Patient/caregiver will verbalize understanding of the [facility] Date Initiated: 01/31/2015 Goal Status: Active Interventions: Provide education on orientation to the wound center Notes: Venous Leg Ulcer Nursing Diagnoses: Potential for venous Insuffiency (use before diagnosis confirmed) Goals: Non-invasive venous studies are completed as ordered Date Initiated: 01/31/2015 Goal Status: Active Patient/caregiver will verbalize understanding of disease process and disease management Date Initiated: 01/31/2015 Goal Status: Active Interventions: Assess peripheral edema status every visit. Notes: Wound/Skin Impairment Nursing Diagnoses: Impaired tissue integrity Knowledge deficit related to smoking impact on wound healing [name], [name] ([id number]) Goals: Patient/caregiver will verbalize understanding of skin care regimen Date Initiated: 01/31/2015 Goal Status: Active Ulcer/skin breakdown will heal within 14 weeks Date Initiated: 01/31/2015 Goal Status: Active Interventions: Assess ulceration(s) every visit Notes: Electronic Signature(s) Signed: 05/13/2015 3:00:11 PM By: Regan Lemming [name], RN Entered By: Regan Lemming on 05/13/2015 10:07:16 [name], [name] ([id number]) -------------------------------------------------------------------------------- Pain Assessment Details Patient Name: Kristin Stare D. Date of Service: 05/13/2015 9:30 AM Medical Record Number: [id number] Patient Account Number: [id number] Date of Birth/Sex: 03-Aug-1946 (68 y.o. Female) Treating RN: Baruch Gouty, RN, [name], [name] Primary Care Physician: Glendon Axe Other Clinician: Referring Physician: Glendon Axe Treating Physician/Extender: Frann Rider in Treatment: 14 Active Problems Location of Pain Severity and Description of Pain Patient  Has Paino No Site Locations Pain Management and Medication Current Pain Management: Electronic Signature(s) Signed:  05/13/2015 3:00:11 PM By: Regan Lemming [name], RN Entered By: Regan Lemming on 05/13/2015 09:42:31 [name], Kristin Masker ([id number]) -------------------------------------------------------------------------------- Patient/Caregiver Education Details Patient Name: Kristin Stare D. Date of Service: 05/13/2015 9:30 AM Medical Record Number: [id number] Patient Account Number: [id number] Date of Birth/Gender: Mar 03, 1946 (68 y.o. Female) Treating RN: [name], RN, [name], [name] Primary Care Physician: Glendon Axe Other Clinician: Referring Physician: Glendon Axe Treating Physician/Extender: Frann Rider in Treatment: 14 Education Assessment Education Provided To: Patient Education Topics Provided Welcome To [facility]: Methods: Explain/Verbal Responses: State content correctly Electronic Signature(s) Signed: 05/13/2015 3:00:11 PM By: Regan Lemming [name], RN Entered By: Regan Lemming on 05/13/2015 10:33:17 [name], [name] ([id number]) -------------------------------------------------------------------------------- Wound Assessment Details Patient Name: NIOBE, DICK D. Date of Service: 05/13/2015 9:30 AM Medical Record Number: [id number] Patient Account Number: [id number] Date of Birth/Sex: 08-14-1946 (68 y.o. Female) Treating RN: [name], RN, [name], [place] Primary Care Physician: Glendon Axe Other Clinician: Referring Physician: Glendon Axe Treating Physician/Extender: Frann Rider in Treatment: 14 Wound Status Wound Number: 2 Primary Venous Leg Ulcer Etiology: Wound Location: Left Lower Leg - Distal Wound Open Wounding Event: Blister Status: Date Acquired: 12/30/2014 Comorbid Cataracts, Asthma, Coronary Artery Weeks Of Treatment: 14 History: Disease, Hypertension, Type II Clustered Wound: Yes Diabetes, Osteoarthritis, Neuropathy Photos Photo Uploaded By: Regan Lemming on 05/13/2015 12:20:37 Wound Measurements Length: (cm) 6.3 Width: (cm) 4 Depth: (cm) 0.5 Area: (cm)  19.792 Volume: (cm) 9.896 % Reduction in Area: -2699.4% % Reduction in Volume: -13838% Epithelialization: None Tunneling: No Undermining: No Wound Description Full Thickness Without Exposed Foul Odor A Classification: Support Structures Diabetic Severity Grade 1 (Wagner): Wound Margin: Distinct, outline attached Exudate Amount: Medium Exudate Type: Serosanguineous Exudate Color: red, brown fter Cleansing: No Wound Bed Granulation Amount: None Present (0%) Exposed Structure [name], [name] ([id number]) Necrotic Amount: Large (67-100%) Fascia Exposed: No Necrotic Quality: Eschar, Adherent Slough Fat Layer Exposed: No Tendon Exposed: No Muscle Exposed: No Joint Exposed: No Bone Exposed: No Limited to Skin Breakdown Periwound Skin Texture Texture Color No Abnormalities Noted: No No Abnormalities Noted: No Callus: No Atrophie Blanche: No Crepitus: No Cyanosis: No Excoriation: No Ecchymosis: No Fluctuance: No Erythema: No Friable: No Hemosiderin Staining: Yes Induration: No Mottled: No Localized Edema: Yes Pallor: No Rash: No Rubor: No Scarring: No Temperature / Pain Moisture Temperature: No Abnormality No Abnormalities Noted: No Tenderness on Palpation: Yes Dry / Scaly: No Maceration: No Moist: Yes Wound Preparation Ulcer Cleansing: Rinsed/Irrigated with Saline Topical Anesthetic Applied: Other: lidocaine 4%, Treatment Notes Wound #2 (Left, Distal Lower Leg) 1. Cleansed with: Clean wound with Normal Saline 4. Dressing Applied: Santyl Ointment 5. Secondary Dressing Applied Gauze and Kerlix/Conform 7. Secured with Recruitment consultant) Signed: 05/13/2015 3:00:11 PM By: Regan Lemming [name], RN Entered By: Regan Lemming on 05/13/2015 09:47:32 Cecilio, Terrina DMarland Kitchen ([id number]) -------------------------------------------------------------------------------- Vitals Details Patient Name: Kristin Stare D. Date of Service: 05/13/2015 9:30 AM Medical Record  Number: [id number] Patient Account Number: [id number] Date of Birth/Sex: 06/03/1946 (68 y.o. Female) Treating RN: [name], RN, [name], [place] Primary Care Physician: Glendon Axe Other Clinician: Referring Physician: Glendon Axe Treating Physician/Extender: Frann Rider in Treatment: 14 Vital Signs Time Taken: 09:40 Temperature (F): 97.7 Height (in): 65 Pulse (bpm): 77 Weight (lbs): 248 Respiratory Rate (breaths/min): 18 Body Mass Index (BMI): 41.3 Blood Pressure (mmHg): 158/64 Reference Range: 80 - 120 mg / dl Electronic Signature(s) Signed: 05/13/2015 3:00:11 PM By: Regan Lemming [name],  RN Entered By: Regan Lemming on 05/13/2015 09:43:01

## 2015-05-13 NOTE — Progress Notes (Signed)
[name], [name] ([id number]) Visit Report for 05/13/2015 Chief Complaint Document Details Patient Name: [name], [name] Date of Service: 05/13/2015 9:30 AM Medical Record Patient Account Number: [id number] Number: [name], RN, [name], Treating RN: [birth year] (69 y.o. [name] Date of Birth/Sex: Female) Other Clinician: Primary Care Physician: [facility], [name] Treating [name] Referring Physician: Glendon [name] Physician/Extender: [name] in Treatment: 14 Information Obtained from: Patient Chief Complaint Patient presents to the wound care center for a consult due non healing wound 69 year old patient comes with a history of having a ulcer on the left lower extremity for the past 4 [name] she says she's had swelling of both lower extremities for about a year after she started having prednisone. 02/07/2015 -- her vascular appointments obtained were in the first and third week of June. she is able to go to [place] and we will try and get her some earlier appointments. Other than that nothing else has changed in her management. Electronic Signature(s) Signed: 05/13/2015 10:21:47 AM By: Kristin Fudge MD, [name] Entered By: [name] on 05/13/2015 10:21:47 [name] Kitchen ([id number]) -------------------------------------------------------------------------------- Debridement Details Patient Name: [name], [name] [name] Date of Service: 05/13/2015 9:30 AM Medical Record Patient Account Number: [id number] Number: [name], RN, [name], Treating RN: 03-Apr-1946 (69 y.o. [name] Date of Birth/Sex: Female) Other Clinician: Primary Care Physician: [facility], [name] Treating Wanya Bangura Referring Physician: Glendon [name] Physician/Extender: [name] in Treatment: 14 Debridement Performed for Wound #2 Left,Distal Lower Leg Assessment: Performed By: Physician Pat Patrick., MD Debridement: Debridement Pre-procedure Yes Verification/Time Out Taken: Start Time: 10:09 Pain  Control: Other : lidocaine 4% Level: Skin/Subcutaneous Tissue Total Area Debrided (L x 6.3 (cm) x 4 (cm) = 25.2 (cm) W): Tissue and other Viable, Non-Viable, Eschar, Fat, Fibrin/Slough, Subcutaneous material debrided: Instrument: Forceps, Scissors Bleeding: Minimum Hemostasis Achieved: Pressure End Time: 10:15 Procedural Pain: 0 Post Procedural Pain: 0 Response to Treatment: Procedure was tolerated well Post Debridement Measurements of Total Wound Length: (cm) 6.3 Width: (cm) 4 Depth: (cm) 0.6 Volume: (cm) 11.875 Electronic Signature(s) Signed: 05/13/2015 10:21:20 AM By: Kristin Fudge MD, [name] Signed: 05/13/2015 3:00:11 PM By: Regan Lemming [name], RN Entered By: [name] on 05/13/2015 10:21:20 [name] ([id number]) -------------------------------------------------------------------------------- HPI Details Patient Name: [name], [name] [name] Date of Service: 05/13/2015 9:30 AM Medical Record Patient Account Number: [id number] Number: [name], RN, [name], Treating RN: December 23, 1945 (69 y.o. [name] Date of Birth/Sex: Female) Other Clinician: Primary Care Physician: [facility], [name] Treating Joyleen Haselton Referring Physician: Glendon [name] Physician/Extender: [name] in Treatment: 14 History of Present Illness HPI Description: 69 year old patient who is known to have diabetes mellitus type 2, chronic renal insufficiency, coronary artery disease, hypertension, hypercholesterolemia, temporal arteritis and inflammatory arthritiss also has a history of having a hysterectomy and some orthopedic related surgeries. The ulcer on the left lower extremity started off as a blister and then. Got progressively worse. She does not have any fever or chills and has not had any recent surgical intervention for this. Her last hemoglobin A1c was 10.1 in September 2015. She has been recently put on doxycycline by her PCP. She is now also allergic to doxycycline and was this was changed over to  Keflex. 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 She does see a cardiologist and also takes a diuretic. 02/07/2015 her arterial and venous duplex studies to be done have dates been given as the first and third week of  June. This is at [facility]. We are going to try and get early appointments at [facility]. other than that nothing has changed in her management. 02/14/2015 -- we have been able to get her an appointment in [facility] on May 20 which is much earlier than her previous ones at [place]. She continues with her prednisone and her sugars are in the range of 150-200. 02/21/2015 We were able to get a vascular lab workup for her today and she is going to be there at 2:00 this afternoon. the swelling of her leg has gone down significantly but she still has some tenderness over the wounds. 02/28/2015 - She has had one of two vascular workups done, and this coming Tuesday has another, at [place] [facility]. She continues to be on steroid medications. She has significant sensitivity in her left lower extremity and has pain suggestive of neuropathic pain and I have asked her to address this with her primary care physician. 03/07/2015 -- The patient saw Dr. Lucky Cowboy for a consultation and he has had her arteries are okay but she has 2 incompetent veins on the left lower extremity and he is going to set her up for surgery. Official report is awaited. Addendum: Official reports are now available and on 03/04/2015. She was seen and lower extremity venous duplex exam was done. There was reflux present within the left greater saphenous vein below the knee and also the left small saphenous vein. Arterial duplex showed normal triphasic waveforms throughout the left lower extremity without any significant stenosis. Her ABIs were noncompressible bilaterally but a waveforms  were normal and a digital pressures were normal bilaterally consistent with no significant arterial insufficiency. He has recommended endovenous ablation of both the left small saphenous and the left great saphenous vein. This would still be scheduled later. 03/14/2015 -- she has heard back from the vascular office and has surgery scheduled for sometime in July. [name], [name] ([id number]) Her rheumatologist has decreased her prednisone dosage but she still on it. She has also had cataract surgery in her right eye recently this week. 03/20/2015 - No new complaints today. Pain improved. No fever or chills. Tolerating 2 layer compression. 04/14/2015 -- she was doing very well today she went off on vacation and now her edema has increased markedly the ulceration is bigger and her diabetes is not under control. 04/21/2015 -- I spoke to her PCP Dr. Candiss Norse and discussed the management which would include being seen by a general surgeon for debridement and taking multiple punch biopsies which would help in establishing the diagnosis of this is a vasculitis. She is agreeable about this and will set her up for the procedure with Dr. Tamala Julian at [facility]. She was seen by the surgeon Dr. Jamal Collin. 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 She is scheduled to have laser ablation done next week.Pt was sent here for possible biopsy to look for vasculitis. Feel it would be better to wait after laser ablation is completed- the ulcer may heal fully and biopsy may not be necessary 04/29/2015 -- she had the venous ablation done by Dr. Lucky Cowboy last Friday and we do not have any notes yet. She is doing fine otherwise. 05/06/2015 --Review of her recent vascular intervention shows that she was seen by Dr. Lucky Cowboy on 04/29/2015. The follow-up duplex which was done showed that both the great saphenous vein and  the small saphenous  vein remained patent with reflux consistent with an unsuccessful ablation. He has rescheduled her for another the endovenous ablation to be done in about 4 weekso time. 05/13/2015 -- he was seen by her surgeon Dr. Jamal Collin who asked her to continue with conservative therapy and he would speak to Dr. Lucky Cowboy about her management. Dr. Lucky Cowboy is going to schedule her surgery in the middle of August for a repeat endovenous ablation. Her pus culture from last week has grown : [place] her noted her sensitivity report but due to her multiple allergies I had tried clindamycin and she developed a rash with this too. She has been prescribed and anti-buttocks in the ER and is has it at home and she will let is know what she is going to be taking. Electronic Signature(s) Signed: 05/13/2015 10:26:52 AM By: Kristin Fudge MD, [name] Entered By: [name] on 05/13/2015 10:26:52 [place], [name] ([id number]) -------------------------------------------------------------------------------- Physical Exam Details Patient Name: DHANVI, BOESEN [name] Date of Service: 05/13/2015 9:30 AM Medical Record Patient Account Number: [id number] Number: [name], RN, [name], Treating RN: June 09, 1946 (69 y.o. [name] Date of Birth/Sex: Female) Other Clinician: Primary Care Physician: [facility], [name] Treating [name] Referring Physician: Glendon [name] Physician/Extender: [name] in Treatment: 14 Constitutional . Pulse regular. Respirations normal and unlabored. Afebrile. . Eyes Nonicteric. Reactive to light. Ears, Nose, Mouth, and Throat Lips, teeth, and gums WNL.Marland Kitchen Moist mucosa without lesions . Neck supple and nontender. No palpable supraclavicular or cervical adenopathy. Normal sized without goiter. Respiratory WNL. No retractions.. Cardiovascular Pedal Pulses WNL. No clubbing, cyanosis or  edema. Musculoskeletal Adexa without tenderness or enlargement.. Digits and nails w/o clubbing, cyanosis, infection, petechiae, ischemia, or inflammatory conditions.. Integumentary (Hair, Skin) No suspicious lesions. No crepitus or fluctuance. No peri-wound warmth or erythema. No masses.Marland Kitchen Psychiatric Judgement and insight Intact.. No evidence of depression, anxiety, or agitation.. Notes Still has a lot of slough and some edges of the wound are necrosed. We will try some sharp dissection. Electronic Signature(s) Signed: 05/13/2015 10:27:40 AM By: Kristin Fudge MD, [name] Entered By: [name] on 05/13/2015 10:27:40 Wehling, [name] ([id number]) -------------------------------------------------------------------------------- Physician Orders Details Patient Name: EVIA, [name] Date of Service: 05/13/2015 9:30 AM Medical Record Patient Account Number: [id number] Number: [name], RN, [name], Treating RN: 04/14/1946 (69 y.o. [name] Date of Birth/Sex: Female) Other Clinician: Primary Care Physician: [facility], [name] Treating [name] Referring Physician: Glendon [name] Physician/Extender: [name] in Treatment: 14 Verbal / Phone Orders: Yes Clinician: [name], RN, [name], Rita Read Back and Verified: Yes Diagnosis Coding Wound Cleansing Wound #2 Left,Distal Lower Leg o Clean wound with Normal Saline. Anesthetic Wound #2 Left,Distal Lower Leg o Topical Lidocaine 4% cream applied to wound bed prior to debridement Primary Wound Dressing Wound #2 Left,Distal Lower Leg o Santyl Ointment Secondary Dressing Wound #2 Left,Distal Lower Leg o ABD and Kerlix/Conform Dressing Change Frequency Wound #2 Left,Distal Lower Leg o Change dressing every day. Follow-up Appointments Wound #2 Left,Distal Lower Leg o Return Appointment in 1 week. Edema Control Wound #2 Left,Distal Lower Leg o Support Garment 20-30 mm/Hg pressure to: Additional Orders / Instructions Wound #2  Left,Distal Lower Leg o Increase protein intake. [name], [name] ([id number]) Medications-please add to medication list. Wound #2 Left,Distal Lower Leg o Santyl Enzymatic Ointment Electronic Signature(s) Signed: 05/13/2015 12:11:50 PM By: Kristin Fudge MD, [name] Signed: 05/13/2015 3:00:11 PM By: Regan Lemming [name], RN Entered By: Regan Lemming on 05/13/2015 10:16:25 Fread, Skylee [name] ([id number]) -------------------------------------------------------------------------------- Problem List  Details Patient Name: [name], BURDITT. Date of Service: 05/13/2015 9:30 AM Medical Record Patient Account Number: [id number] Number: [name], RN, [name], Treating RN: [birth year] (69 y.o. 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 ([id number]) -------------------------------------------------------------------------------- Progress Note Details Patient Name: Kamm, Jeaneane [name] Date of Service: 05/13/2015 9:30 AM Medical Record Patient Account Number: [id number] Number: [name], RN, [name], Treating RN: 04-10-1946 (69  y.o. [name] Date of Birth/Sex: Female) Other Clinician: Primary Care Physician: [facility], [name] Treating [name] Referring Physician: Glendon [name] Physician/Extender: [name] in Treatment: 14 Subjective Chief Complaint Information obtained from Patient Patient presents to the wound care center for a consult due non healing wound 69 year old patient comes with a history of having a ulcer on the left lower extremity for the past 4 [name] she says she's had swelling of both lower extremities for about a year after she started having prednisone. 02/07/2015 -- her vascular appointments obtained were in the first and third week of June. she is able to go to [place] and we will try and get her some earlier appointments. Other than that nothing else has changed in her management. History of Present Illness (HPI) 69 year old patient who is known to have diabetes mellitus type 2, chronic renal insufficiency, coronary artery disease, hypertension, hypercholesterolemia, temporal arteritis and inflammatory arthritiss also has a history of having a hysterectomy and some orthopedic related surgeries. The ulcer on the left lower extremity started off as a blister and then. Got progressively worse. She does not have any fever or chills and has not had any recent surgical intervention for this. Her last hemoglobin A1c was 10.1 in September 2015. She has been recently put on doxycycline by her PCP. She is now also allergic to doxycycline and was this was changed over to Keflex. 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 She does see a cardiologist and also takes a diuretic. 02/07/2015 her arterial and venous duplex studies to be done have dates been given as the first and third week of June. This is at [facility]. We are going to try and get early appointments at [facility]. other than  that nothing has changed in her management. 02/14/2015 -- we have been able to get her an appointment in [facility] on May 20 which is much earlier than her previous ones at [place]. She continues with her prednisone and her sugars are in the range of 150-200. 02/21/2015 We were able to get a vascular lab workup for her today and she is going to be there at 2:00 this afternoon. the swelling of her leg has gone down significantly but she still has some tenderness over the wounds. 02/28/2015 - She has  had one of two vascular workups done, and this coming Tuesday has another, at [place] [facility]. She continues to be on steroid medications. She has significant sensitivity in her left lower extremity and has pain suggestive of neuropathic pain and I have asked her to address this with her primary care physician. 03/07/2015 -- The patient saw Dr. Lucky Cowboy for a consultation and he has had her arteries are okay but she has 2 incompetent veins on the left lower extremity and he is going to set her up for surgery. Official report is [name], [name] ([id number]) awaited. Addendum: Official reports are now available and on 03/04/2015. She was seen and lower extremity venous duplex exam was done. There was reflux present within the left greater saphenous vein below the knee and also the left small saphenous vein. Arterial duplex showed normal triphasic waveforms throughout the left lower extremity without any significant stenosis. Her ABIs were noncompressible bilaterally but a waveforms were normal and a digital pressures were normal bilaterally consistent with no significant arterial insufficiency. He has recommended endovenous ablation of both the left small saphenous and the left great saphenous vein. This would still be scheduled later. 03/14/2015 -- she has heard back from the vascular office and has surgery scheduled for sometime in July. Her rheumatologist has decreased her prednisone  dosage but she still on it. She has also had cataract surgery in her right eye recently this week. 03/20/2015 - No new complaints today. Pain improved. No fever or chills. Tolerating 2 layer compression. 04/14/2015 -- she was doing very well today she went off on vacation and now her edema has increased markedly the ulceration is bigger and her diabetes is not under control. 04/21/2015 -- I spoke to her PCP Dr. Candiss Norse and discussed the management which would include being seen by a general surgeon for debridement and taking multiple punch biopsies which would help in establishing the diagnosis of this is a vasculitis. She is agreeable about this and will set her up for the procedure with Dr. Tamala Julian at [facility]. She was seen by the surgeon Dr. Jamal Collin. 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 She is scheduled to have laser ablation done next week.Pt was sent here for possible biopsy to look for vasculitis. Feel it would be better to wait after laser ablation is completed- the ulcer may heal fully and biopsy may not be necessary 04/29/2015 -- she had the venous ablation done by Dr. Lucky Cowboy last Friday and we do not have any notes yet. She is doing fine otherwise. 05/06/2015 --Review of her recent vascular intervention shows that she was seen by Dr. Lucky Cowboy on 04/29/2015. The follow-up duplex which was done showed that both the great saphenous vein and the small saphenous vein remained patent with reflux consistent with an unsuccessful ablation. He has rescheduled her for another the endovenous ablation to be done in about 4 [name] time. 05/13/2015 -- he was seen by her surgeon Dr. Jamal Collin who asked her to continue with conservative therapy and he would speak to Dr. Lucky Cowboy about her management. Dr. Lucky Cowboy is going to schedule her surgery in the middle of August for a repeat endovenous  ablation. Her pus culture from last week has grown : [place] her noted her sensitivity report but due to her multiple allergies I had tried clindamycin and she developed a  rash with this too. She has been prescribed and anti-buttocks in the ER and is has it at home and she will let is know what she is going to be taking. Allergies Cipro, Levaquin, penicillin, Sulfa (Sulfonamide Antibiotics), doxycycline, clindamycin (Severity: Severe, Reaction: itching, breathing problems) [name], [name] ([id number]) Objective Constitutional Pulse regular. Respirations normal and unlabored. Afebrile. Vitals Time Taken: 9:40 AM, Height: 65 in, Weight: 248 lbs, BMI: 41.3, Temperature: 97.7 F, Pulse: 77 bpm, Respiratory Rate: 18 breaths/min, Blood Pressure: 158/64 mmHg. Eyes Nonicteric. Reactive to light. Ears, Nose, Mouth, and Throat Lips, teeth, and gums WNL.Marland Kitchen Moist mucosa without lesions . Neck supple and nontender. No palpable supraclavicular or cervical adenopathy. Normal sized without goiter. Respiratory WNL. No retractions.. Cardiovascular Pedal Pulses WNL. No clubbing, cyanosis or edema. Musculoskeletal Adexa without tenderness or enlargement.. Digits and nails w/o clubbing, cyanosis, infection, petechiae, ischemia, or inflammatory conditions.Marland Kitchen Psychiatric Judgement and insight Intact.. No evidence of depression, anxiety, or agitation.. General Notes: Still has a lot of slough and some edges of the wound are necrosed. We will try some sharp dissection. Integumentary (Hair, Skin) No suspicious lesions. No crepitus or fluctuance. No peri-wound warmth or erythema. No masses.. Wound #2 status is Open. Original cause of wound was Blister. The wound is located on the Left,Distal Lower Leg. The wound measures 6.3cm length x 4cm width x 0.5cm depth; 19.792cm^2 area and 9.896cm^3 volume. The wound is  limited to skin breakdown. There is no tunneling or undermining noted. There is a medium amount of serosanguineous drainage noted. The wound margin is distinct with the outline attached to the wound base. There is no granulation within the wound bed. There is a large (67- 100%) amount of necrotic tissue within the wound bed including Eschar and Adherent Slough. The periwound skin appearance exhibited: Localized Edema, Moist, Hemosiderin Staining. The periwound skin appearance did not exhibit: Callus, Crepitus, Excoriation, Fluctuance, Friable, Induration, Rash, Scarring, Dry/Scaly, Maceration, Atrophie Blanche, Cyanosis, Ecchymosis, Mottled, Pallor, Rubor, Erythema. Dohmen, [name] [name] ([id number]) Periwound temperature was noted as No Abnormality. The periwound has tenderness on palpation. 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 She would benefit from an ID consultation. I have recommended surgical intervention for sharp debridement, biopsies and possible wound VAC application but her surgeon has recommended conservator therapy. I will speak with Dr. Lucky Cowboy, who was going to be seeing her soon for endovenous ablation and maybe he can help with a surgical procedure for debridement and wound VAC application. Management has been discussed with Vaughan Basta in great detail and she is agreeable to proceed Procedures Wound #2 Wound #2 is a Venous Leg Ulcer located on the  Left,Distal Lower Leg . There was a Skin/Subcutaneous Tissue Debridement ([id number]) debridement with total area of 25.2 sq cm performed by Shaniqua Guillot, Jackson Latino., MD. with the following instrument(s): Forceps and Scissors to remove Viable and Non-Viable tissue/material including Fat, Fibrin/Slough, Eschar, and Subcutaneous after achieving pain control using Other (lidocaine 4%). A time out was conducted prior to the start  of the procedure. A Minimum amount of bleeding was controlled with Pressure. The procedure was tolerated well with a pain level of 0 throughout and a pain level of 0 following the procedure. Post Debridement Measurements: 6.3cm length x 4cm width x 0.6cm depth; 11.875cm^3 volume. SHERITHA, LOUIS ([id number]) Plan Wound Cleansing: Wound #2 Left,Distal Lower Leg: Clean wound with Normal Saline. Anesthetic: Wound #2 Left,Distal Lower Leg: Topical Lidocaine 4% cream applied to wound bed prior to debridement Primary Wound Dressing: Wound #2 Left,Distal Lower Leg: Santyl Ointment Secondary Dressing: Wound #2 Left,Distal Lower Leg: ABD and Kerlix/Conform Dressing Change Frequency: Wound #2 Left,Distal Lower Leg: Change dressing every day. Follow-up Appointments: Wound #2 Left,Distal Lower Leg: Return Appointment in 1 week. Edema Control: Wound #2 Left,Distal Lower Leg: Support Garment 20-30 mm/Hg pressure to: Additional Orders / Instructions: Wound #2 Left,Distal Lower Leg: Increase protein intake. Medications-please add to medication list.: Wound #2 Left,Distal Lower Leg: Santyl Enzymatic Ointment At this stage we have asked her to continue with Santyl ointment locally and take the antibiotics which have been prescribed by the ER. She would benefit from an ID consultation. I have recommended surgical intervention for sharp debridement, biopsies and possible wound VAC application but her surgeon has recommended conservator therapy. I will speak with Dr. Lucky Cowboy, who was  going to be seeing her soon for endovenous ablation and maybe he can help with a surgical procedure for debridement and wound VAC application. Management has been discussed with Vaughan Basta in great detail and she is agreeable to proceed Electronic Signature(s) KEIRA, BOHLIN ([id number]) Signed: 05/13/2015 10:30:09 AM By: Kristin Fudge MD, [name] Entered By: [name] on 05/13/2015 10:30:09 Coonradt, Delainee [name] ([id number]) -------------------------------------------------------------------------------- SuperBill Details Patient Name: [name] Date of Service: 05/13/2015 Medical Record Patient Account Number: [id number] Number: [name], RN, [name], Treating RN: [birth year] (69 y.o. 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 Facility Procedures CPT4: Description Modifier Quantity Code [id number] 11042 - DEB SUBQ TISSUE 20 SQ CM/< 1 ICD-10 Description Diagnosis 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 ([id number]) Electronic Signature(s) Signed: 05/13/2015 10:30:46 AM By: Kristin Fudge MD, [name] Entered By: [name] on 05/13/2015 10:30:46

## 2015-05-14 LAB — ANAEROBIC CULTURE

## 2015-05-16 LAB — WOUND CULTURE

## 2015-05-20 ENCOUNTER — Encounter: Payer: PPO | Admitting: Surgery

## 2015-05-20 DIAGNOSIS — L97321 Non-pressure chronic ulcer of left ankle limited to breakdown of skin: Secondary | ICD-10-CM | POA: Diagnosis not present

## 2015-05-21 NOTE — Progress Notes (Signed)
[name], [name] ([id number]) Visit Report for 05/20/2015 Chief Complaint Document Details Patient Name: [name], [name] 05/20/2015 2:00 Date of Service: PM Medical Record [id number] Number: Patient Account Number: [id number] Jun 02, 1946 (69 y.o. Treating RN: Date of Birth/Sex: Female) Other Clinician: Primary Care Physician: [facility], [name] Treating [name] Referring Physician: Glendon Axe Physician/Extender: Weeks in Treatment: 15 Information Obtained from: Patient Chief Complaint Patient presents to the wound care center for a consult due non healing wound 69 year old patient comes with a history of having a ulcer on the left lower extremity for the past 4 weeks. she says she's had swelling of both lower extremities for about a year after she started having prednisone. 02/07/2015 -- her vascular appointments obtained were in the first and third week of June. she is able to go to [place] and we will try and get her some earlier appointments. Other than that nothing else has changed in her management. Electronic Signature(s) Signed: 05/20/2015 2:42:36 PM By: [name], [name] Entered By: [name] on 05/20/2015 14:42:36 Passow, ANNICE JOLLY ([id number]) -------------------------------------------------------------------------------- HPI Details Patient Name: [name], [name] 05/20/2015 2:00 Date of Service: PM Medical Record [id number] Number: Patient Account Number: [id number] 06-17-1946 (69 y.o. Treating RN: Date of Birth/Sex: Female) Other Clinician: Primary Care Physician: [facility], [name] Treating Aijalon Kirtz Referring Physician: Glendon Axe Physician/Extender: Weeks in Treatment: 15 History of Present Illness HPI Description: 69 year old patient who is known to have diabetes mellitus type 2, chronic renal insufficiency, coronary artery disease, hypertension, hypercholesterolemia, temporal arteritis and inflammatory arthritiss also has a history of having  a hysterectomy and some orthopedic related surgeries. The ulcer on the left lower extremity started off as a blister and then. Got progressively worse. She does not have any fever or chills and has not had any recent surgical intervention for this. Her last hemoglobin A1c was 10.1 in September 2015. She has been recently put on doxycycline by her PCP. She is now also allergic to doxycycline and was this was changed over to Keflex. 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 She does see a cardiologist and also takes a diuretic. 02/07/2015 her arterial and venous duplex studies to be done have dates been given as the first and third week of June. This is at [facility]. We are going to try and get early appointments at [facility]. other than that nothing has changed in her management. 02/14/2015 -- we have been able to get her an appointment in [facility] on May 20 which is much earlier than her previous ones at [place]. She continues with her prednisone and her sugars are in the range of 150-200. 02/21/2015 We were able to get a vascular lab workup for her today and she is going to be there at 2:00 this afternoon. the swelling of her leg has gone down significantly but she still has some tenderness over the wounds. 02/28/2015 - She has had one of two vascular workups done, and this coming Tuesday has another, at [place] [facility]. She continues to be on steroid medications. She has significant sensitivity in her left lower extremity and has pain suggestive of neuropathic pain and I have asked her to address this with her primary care physician. 03/07/2015 -- The patient saw Dr. 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 Official report  is awaited. Addendum: Official reports are now available and on 03/04/2015. She was seen and lower extremity venous duplex exam was done. There was reflux present within the left greater saphenous vein below the knee and also the left small saphenous vein. Arterial duplex showed normal triphasic waveforms throughout the left lower extremity without any significant stenosis. Her ABIs were noncompressible bilaterally but a waveforms were normal and a digital pressures were normal bilaterally consistent with no significant arterial insufficiency. He has recommended endovenous ablation of both the left small saphenous and the left great saphenous vein. This would still be scheduled later. 03/14/2015 -- she has heard back from the vascular office and has surgery scheduled for sometime in July. [name], [name] ([id number]) Her rheumatologist has decreased her prednisone dosage but she still on it. She has also had cataract surgery in her right eye recently this week. 03/20/2015 - No new complaints today. Pain improved. No fever or chills. Tolerating 2 layer compression. 04/14/2015 -- she was doing very well today she went off on vacation and now her edema has increased markedly the ulceration is bigger and her diabetes is not under control. 04/21/2015 -- I spoke to her PCP Dr. Candiss Norse and discussed the management which would include being seen by a general surgeon for debridement and taking multiple punch biopsies which would help in establishing the diagnosis of this is a vasculitis. She is agreeable about this and will set her up for the procedure with Dr. Tamala Julian at [facility]. She was seen by the surgeon Dr. Jamal Collin. 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 She is scheduled to have laser ablation done next week.Pt was sent here for possible biopsy to look for vasculitis. Feel it  would be better to wait after laser ablation is completed- the ulcer may heal fully and biopsy may not be necessary 04/29/2015 -- she had the venous ablation done by Dr. Lucky Cowboy last Friday and we do not have any notes yet. She is doing fine otherwise. 05/06/2015 --Review of her recent vascular intervention shows that she was seen by Dr. Lucky Cowboy on 04/29/2015. The follow-up duplex which was done showed that both the great saphenous vein and the small saphenous vein remained patent with reflux consistent with an unsuccessful ablation. He has rescheduled her for another the endovenous ablation to be done in about 4 weekso time. 05/13/2015 -- he was seen by her surgeon Dr. Jamal Collin who asked her to continue with conservative therapy and he would speak to Dr. Lucky Cowboy about her management. Dr. Lucky Cowboy is going to schedule her surgery in the middle of August for a repeat endovenous ablation. Her pus culture from last week has grown : [place] her noted her sensitivity report but due to her multiple allergies I had tried clindamycin and she developed a rash with this too. She has been prescribed and anti-buttocks in the ER and is has it at home and she will let is know what she is going to be taking. 05/20/2015 -- she has developed a small spot on her right lower extremity but besides that it is not a full fledged ulceration. She did not get to see Dr. Lucky Cowboy last week and hopefully she will see him in the near future. Electronic Signature(s) Signed: 05/20/2015 2:49:36 PM By: [name],  [name] Entered By: [name] on 05/20/2015 14:49:36 [name], [name] ([id number]) -------------------------------------------------------------------------------- Physical Exam Details Patient Name: [name], [name] 05/20/2015 2:00 Date of Service: PM Medical Record [id number] Number: Patient Account Number: [id number] 1946/09/20  (68 y.o. Treating RN: Date of Birth/Sex: Female) Other Clinician: Primary Care Physician: [facility], [name] Treating Crislyn Willbanks Referring Physician: Glendon Axe Physician/Extender: Weeks in Treatment: 15 Constitutional . Pulse regular. Respirations normal and unlabored. Afebrile. . Eyes Nonicteric. Reactive to light. Ears, Nose, Mouth, and Throat Lips, teeth, and gums WNL.Marland [name] Moist mucosa without lesions . Neck supple and nontender. No palpable supraclavicular or cervical adenopathy. Normal sized without goiter. Respiratory WNL. No retractions.. Cardiovascular Pedal Pulses WNL. No clubbing, cyanosis or edema. Chest Breasts symmetical and no nipple discharge.. Breast tissue WNL, no masses, lumps, or tenderness.. Lymphatic No adneopathy. No adenopathy. No adenopathy. Musculoskeletal Adexa without tenderness or enlargement.. Digits and nails w/o clubbing, cyanosis, infection, petechiae, ischemia, or inflammatory conditions.. Integumentary (Hair, Skin) No suspicious lesions. No crepitus or fluctuance. No peri-wound warmth or erythema. No masses.Marland [name] Psychiatric Judgement and insight Intact.. No evidence of depression, anxiety, or agitation.. Notes The left lower extremity ulceration has a lot of slough and there is extreme tenderness to touch. Unfortunately this cannot be sharply debrided in the office. on the lateral part of her right lower extremity she's got a very shallow eschar but it is not a full fledged ulceration and I will paint this with Betadine. Electronic Signature(s) Signed: 05/20/2015 2:50:43 PM By: [name], [name] Entered By: [name] on 05/20/2015 14:50:42 [name], [name] ([id number]) -------------------------------------------------------------------------------- Physician Orders Details Patient Name: [name], [name] 05/20/2015 2:00 Date of Service: PM Medical Record [id number] Number: Patient Account Number: [id number] [birth year] (69 y.o. Treating  RN: [name] Date of Birth/Sex: Female) Other Clinician: Primary Care Physician: [facility], Delana Meyer Treating [name] Referring Physician: Glendon Axe Physician/Extender: Weeks in Treatment: 15 Verbal / Phone Orders: Yes Clinician: [name] Read Back and Verified: Yes Diagnosis Coding Wound Cleansing Wound #2 Left,Distal Lower Leg o Clean wound with Normal Saline. Wound #5 Right,Posterior Lower Leg o Clean wound with Normal Saline. Anesthetic Wound #2 Left,Distal Lower Leg o Topical Lidocaine 4% cream applied to wound bed prior to debridement Wound #5 Right,Posterior Lower Leg o Topical Lidocaine 4% cream applied to wound bed prior to debridement Primary Wound Dressing Wound #2 Left,Distal Lower Leg o Santyl Ointment Wound #5 Right,Posterior Lower Leg o Other: - betadine paint Secondary Dressing Wound #2 Left,Distal Lower Leg o ABD and Kerlix/Conform Dressing Change Frequency Wound #2 Left,Distal Lower Leg o Change dressing every day. Wound #5 Right,Posterior Lower Leg o Change dressing every day. Follow-up Appointments Wound #2 Left,Distal Lower Leg [name], [name] ([id number]) o Return Appointment in 1 week. Wound #5 Right,Posterior Lower Leg o Return Appointment in 1 week. Edema Control Wound #2 Left,Distal Lower Leg o Support Garment 20-30 mm/Hg pressure to: Wound #5 Right,Posterior Lower Leg o Support Garment 20-30 mm/Hg pressure to: Additional Orders / Instructions Wound #2 Left,Distal Lower Leg o Increase protein intake. Wound #5 Right,Posterior Lower Leg o Increase protein intake. Medications-please add to medication list. Wound #2 Left,Distal Lower Leg o Santyl Enzymatic Ointment Electronic Signature(s) Signed: 05/20/2015 3:54:23 PM By: [name], [name] Signed: 05/20/2015 4:46:32 PM By: [name] Entered By: [name] on 05/20/2015 14:36:15 [name], [name]  ([id number]) -------------------------------------------------------------------------------- Problem List Details Patient Name: SAVI, LASTINGER D. 05/20/2015 2:00 Date of Service: PM Medical Record [id number] Number: Patient Account Number: [id number] 1946-07-09 (69 y.o. 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 05/20/2015 2:00 Date of Service: PM Medical Record [id number] Number: Patient Account Number: [id number] 09-Dec-1945 (69 y.o. Treating RN: Date of Birth/Sex: Female) Other Clinician: Primary Care Physician: [facility], [name] Treating [name] Referring Physician: Glendon Axe Physician/Extender: Weeks in Treatment: 15 Subjective Chief Complaint Information obtained from Patient Patient presents to the wound care center for a consult due non healing wound 69 year old patient comes with a history of having a ulcer on the left lower extremity  for the past 4 weeks. she says she's had swelling of both lower extremities for about a year after she started having prednisone. 02/07/2015 -- her vascular appointments obtained were in the first and third week of June. she is able to go to [place] and we will try and get her some earlier appointments. Other than that nothing else has changed in her management. History of Present Illness (HPI) 69 year old patient who is known to have diabetes mellitus type 2, chronic renal insufficiency, coronary artery disease, hypertension, hypercholesterolemia, temporal arteritis and inflammatory arthritiss also has a history of having a hysterectomy and some orthopedic related surgeries. The ulcer on the left lower extremity started off as a blister and then. Got progressively worse. She does not have any fever or chills and has not had any recent surgical intervention for this. Her last hemoglobin A1c was 10.1 in September 2015. She has been recently put on doxycycline by her PCP. She is now also allergic to doxycycline and was this was changed over to Keflex. 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 She does see a cardiologist and also takes a diuretic. 02/07/2015 her arterial and venous duplex studies to be done have dates been given as the first and third week of June. This is at [facility]. We are going to try and get early appointments at [facility]. other than that nothing has changed in her management. 02/14/2015 -- we have been able to get her an appointment in [facility] on May 20 which is much earlier than her previous ones at [place]. She continues with her prednisone and her sugars are in the range of 150-200. 02/21/2015 We were able to get a vascular lab workup for her today and she is going to be there at 2:00 this afternoon. the swelling of her leg has gone down  significantly but she still has some tenderness over the wounds. 02/28/2015 - She has had one of two vascular workups done, and this coming Tuesday has another, at [place] [facility]. She continues to be on steroid medications. She has significant sensitivity in her left  lower extremity and has pain suggestive of neuropathic pain and I have asked her to address this with her primary care physician. 03/07/2015 -- The patient saw Dr. Lucky Cowboy for a consultation and he has had her arteries are okay but she has 2 incompetent veins on the left lower extremity and he is going to set her up for surgery. Official report is [name], MARTINEZ ([id number]) awaited. Addendum: Official reports are now available and on 03/04/2015. She was seen and lower extremity venous duplex exam was done. There was reflux present within the left greater saphenous vein below the knee and also the left small saphenous vein. Arterial duplex showed normal triphasic waveforms throughout the left lower extremity without any significant stenosis. Her ABIs were noncompressible bilaterally but a waveforms were normal and a digital pressures were normal bilaterally consistent with no significant arterial insufficiency. He has recommended endovenous ablation of both the left small saphenous and the left great saphenous vein. This would still be scheduled later. 03/14/2015 -- she has heard back from the vascular office and has surgery scheduled for sometime in July. Her rheumatologist has decreased her prednisone dosage but she still on it. She has also had cataract surgery in her right eye recently this week. 03/20/2015 - No new complaints today. Pain improved. No fever or chills. Tolerating 2 layer compression. 04/14/2015 -- she was doing very well today she went off on vacation and now her edema has increased markedly the ulceration is bigger and her diabetes is not under control. 04/21/2015 -- I spoke to her PCP Dr. Candiss Norse  and discussed the management which would include being seen by a general surgeon for debridement and taking multiple punch biopsies which would help in establishing the diagnosis of this is a vasculitis. She is agreeable about this and will set her up for the procedure with Dr. Tamala Julian at [facility]. She was seen by the surgeon Dr. Jamal Collin. 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 She is scheduled to have laser ablation done next week.Pt was sent here for possible biopsy to look for vasculitis. Feel it would be better to wait after laser ablation is completed- the ulcer may heal fully and biopsy may not be necessary 04/29/2015 -- she had the venous ablation done by Dr. Lucky Cowboy last Friday and we do not have any notes yet. She is doing fine otherwise. 05/06/2015 --Review of her recent vascular intervention shows that she was seen by Dr. Lucky Cowboy on 04/29/2015. The follow-up duplex which was done showed that both the great saphenous vein and the small saphenous vein remained patent with reflux consistent with an unsuccessful ablation. He has rescheduled her for another the endovenous ablation to be done in about 4 weeks time. 05/13/2015 -- he was seen by her surgeon Dr. Jamal Collin who asked her to continue with conservative therapy and he would speak to Dr. Lucky Cowboy about her management. Dr. Lucky Cowboy is going to schedule her surgery in the middle of August for a repeat endovenous ablation. Her pus culture from last week has grown : [place] her noted her sensitivity report but due to her multiple allergies I had tried clindamycin and she developed a rash with this too. She has been prescribed and anti-buttocks in the ER and is has it at home and she will let is know what she is going to be  taking. 05/20/2015 --  she has developed a small spot on her right lower extremity but besides that it is not a full fledged ulceration. She did not get to see Dr. Lucky Cowboy last week and hopefully she will see him in the near future. ESTEFANY, GOEBEL D. ([id number]) Objective Constitutional Pulse regular. Respirations normal and unlabored. Afebrile. Vitals Time Taken: 2:14 PM, Height: 65 in, Weight: 248 lbs, BMI: 41.3, Temperature: 98.2 F, Pulse: 66 bpm, Respiratory Rate: 18 breaths/min, Blood Pressure: 134/55 mmHg. Eyes Nonicteric. Reactive to light. Ears, Nose, Mouth, and Throat Lips, teeth, and gums WNL.Marland [name] Moist mucosa without lesions . Neck supple and nontender. No palpable supraclavicular or cervical adenopathy. Normal sized without goiter. Respiratory WNL. No retractions.. Cardiovascular Pedal Pulses WNL. No clubbing, cyanosis or edema. Chest Breasts symmetical and no nipple discharge.. Breast tissue WNL, no masses, lumps, or tenderness.. Lymphatic No adneopathy. No adenopathy. No adenopathy. Musculoskeletal Adexa without tenderness or enlargement.. Digits and nails w/o clubbing, cyanosis, infection, petechiae, ischemia, or inflammatory conditions.Marland [name] Psychiatric Judgement and insight Intact.. No evidence of depression, anxiety, or agitation.. General Notes: The left lower extremity ulceration has a lot of slough and there is extreme tenderness to touch. Unfortunately this cannot be sharply debrided in the office. on the lateral part of her right lower extremity she's got a very shallow eschar but it is not a full fledged ulceration and I will paint this with Betadine. Integumentary (Hair, Skin) No suspicious lesions. No crepitus or fluctuance. No peri-wound warmth or erythema. No masses.. Wound #2 status is Open. Original cause of wound was Blister. The wound is located on the Left,Distal Riddle, Mailin D. ([id number]) Lower Leg. The wound measures 6.3cm length x 4.6cm width x 0.6cm  depth; 22.761cm^2 area and 13.657cm^3 volume. The wound is limited to skin breakdown. There is no tunneling or undermining noted. There is a medium amount of serosanguineous drainage noted. The wound margin is distinct with the outline attached to the wound base. There is no granulation within the wound bed. There is a large (67- 100%) amount of necrotic tissue within the wound bed including Eschar and Adherent Slough. The periwound skin appearance exhibited: Localized Edema, Moist, Hemosiderin Staining. The periwound skin appearance did not exhibit: Callus, Crepitus, Excoriation, Fluctuance, Friable, Induration, Rash, Scarring, Dry/Scaly, Maceration, Atrophie Blanche, Cyanosis, Ecchymosis, Mottled, Pallor, Rubor, Erythema. Periwound temperature was noted as No Abnormality. The periwound has tenderness on palpation. Wound #5 status is Open. Original cause of wound was Gradually Appeared. The wound is located on the Right,Posterior Lower Leg. The wound measures 0.4cm length x 0.6cm width x 0.1cm depth; 0.188cm^2 area and 0.019cm^3 volume. The wound is limited to skin breakdown. There is no tunneling or undermining noted. There is a none present amount of drainage noted. The wound margin is flat and intact. There is no granulation within the wound bed. There is a large (67-100%) amount of necrotic tissue within the wound bed including Eschar. The periwound skin appearance exhibited: Hemosiderin Staining. The periwound skin appearance did not exhibit: Callus, Crepitus, Excoriation, Fluctuance, Friable, Induration, Localized Edema, Rash, Scarring, Dry/Scaly, Maceration, Moist, Atrophie Blanche, Cyanosis, Ecchymosis, Mottled, Pallor, Rubor, Erythema. 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 The next time she sees Dr. Lucky Cowboy I have asked her to convey my concerns about this and asked him to call me. The left lower extremity ulceration has a lot of slough and there is extreme tenderness to touch. Unfortunately this cannot be sharply debrided in the office. On the lateral part of her right lower extremity she's got a very shallow eschar but it is not a full fledged ulceration and I will paint this with Betadine. She will come back to see me next week Summerson, Denica D. ([id number]) Plan Wound Cleansing: Wound #2 Left,Distal Lower Leg: Clean wound with Normal Saline. Wound #5 Right,Posterior Lower Leg: Clean wound with Normal Saline. Anesthetic: Wound #2 Left,Distal Lower Leg: Topical Lidocaine 4% cream applied to wound bed prior to debridement Wound #5 Right,Posterior Lower Leg: Topical Lidocaine 4% cream applied to wound bed prior to debridement Primary Wound Dressing: Wound #2 Left,Distal Lower Leg: Santyl Ointment Wound #5 Right,Posterior Lower Leg: Other: - betadine paint Secondary Dressing: Wound #2 Left,Distal Lower Leg: ABD and Kerlix/Conform Dressing Change Frequency: Wound #2 Left,Distal Lower Leg: Change dressing every day. Wound #5 Right,Posterior Lower Leg: Change dressing every day. Follow-up Appointments: Wound #2 Left,Distal Lower Leg: Return Appointment in 1 week. Wound #5 Right,Posterior Lower Leg: Return Appointment in 1 week. Edema Control: Wound #2 Left,Distal Lower Leg: Support Garment 20-30 mm/Hg pressure to: Wound #5 Right,Posterior Lower Leg: Support Garment 20-30  mm/Hg pressure to: Additional Orders / Instructions: Wound #2 Left,Distal Lower Leg: Increase protein intake. Wound #5 Right,Posterior Lower Leg: Increase protein intake. Medications-please add to medication list.: Wound #2 Left,Distal Lower Leg: Santyl Enzymatic Ointment Humphrey, Jianna D. ([id number]) The patient and I have had a long discussion regarding the need for surgical debridement in the OR for her left lower extremity and possibly getting biopsies and placing a wound VAC. The next time she sees Dr. Lucky Cowboy I have asked her to convey my concerns about this and asked him to call me. The left lower extremity ulceration has a lot of slough and there is extreme tenderness to touch. Unfortunately this cannot be sharply debrided in the office. On the lateral part of her right lower extremity she's got a very shallow eschar but it is not a full fledged ulceration and I will paint this with Betadine. She will come back to see me next week. Electronic Signature(s) Signed: 05/20/2015 2:52:35 PM By: [name], [name] Entered By: [name] on 05/20/2015 14:52:35 Oriley, Carri DMarland [name] ([id number]) -------------------------------------------------------------------------------- SuperBill Details Patient Name: DARIONNA, BANKE D. Date of Service: 05/20/2015 Medical Record Number: [id number] Patient Account Number: [id number] Date of Birth/Sex: [birth year] (68 y.o. 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

## 2015-05-21 NOTE — Progress Notes (Signed)
GWENDALYNN, ECKSTROM ([id number]) Visit Report for 05/20/2015 Arrival Information Details Patient Name: AVANGELINA, FLIGHT. Date of Service: 05/20/2015 2:00 PM Medical Record Number: [id number] Patient Account Number: [id number] Date of Birth/Sex: October 28, 1945 (68 y.o. Female) Treating RN: [name] Primary Care Physician: Glendon Axe Other Clinician: Referring Physician: Glendon Axe Treating Physician/Extender: Frann Rider in Treatment: 15 Visit Information History Since Last Visit Added or deleted any medications: No Patient Arrived: Cane Any new allergies or adverse reactions: No Arrival Time: 14:13 Had a fall or experienced change in No Accompanied By: granddaughter activities of daily living that may affect Transfer Assistance: None risk of falls: Patient Identification Verified: Yes Signs or symptoms of abuse/neglect since last No Secondary Verification Yes visito Process Completed: Hospitalized since last visit: No Patient Has Alerts: Yes Pain Present Now: No Patient Alerts: Patient on Blood Thinner Electronic Signature(s) Signed: 05/20/2015 4:46:32 PM By: [name] Entered By: [name] on 05/20/2015 14:13:44 Stork, Kabrea D. ([id number]) -------------------------------------------------------------------------------- Clinic Level of Care Assessment Details Patient Name: [name] Date of Service: 05/20/2015 2:00 PM Medical Record Number: [id number] Patient Account Number: [id number] Date of Birth/Sex: 1946/03/23 (68 y.o. Female) Treating RN: [name] Primary Care Physician: Glendon Axe Other Clinician: Referring Physician: Glendon Axe Treating Physician/Extender: Frann Rider in Treatment: 15 Clinic Level of Care Assessment Items TOOL 4 Quantity Score []  - Use when only an EandM is performed on FOLLOW-UP visit 0 ASSESSMENTS - Nursing Assessment / Reassessment X - Reassessment of Co-morbidities (includes updates in patient  status) 1 10 X - Reassessment of Adherence to Treatment Plan 1 5 ASSESSMENTS - Wound and Skin Assessment / Reassessment []  - Simple Wound Assessment / Reassessment - one wound 0 X - Complex Wound Assessment / Reassessment - multiple wounds 2 5 []  - Dermatologic / Skin Assessment (not related to wound area) 0 ASSESSMENTS - Focused Assessment X - Circumferential Edema Measurements - multi extremities 2 5 []  - Nutritional Assessment / Counseling / Intervention 0 X - Lower Extremity Assessment (monofilament, tuning fork, pulses) 1 5 []  - Peripheral Arterial Disease Assessment (using hand held doppler) 0 ASSESSMENTS - Ostomy and/or Continence Assessment and Care []  - Incontinence Assessment and Management 0 []  - Ostomy Care Assessment and Management (repouching, etc.) 0 PROCESS - Coordination of Care X - Simple Patient / Family Education for ongoing care 1 15 []  - Complex (extensive) Patient / Family Education for ongoing care 0 []  - Staff obtains Programmer, systems, Records, Test Results / Process Orders 0 []  - Staff telephones HHA, Nursing Homes / Clarify orders / etc 0 []  - Routine Transfer to another Facility (non-emergent condition) 0 Mcculley, Eutha D. ([id number]) []  - Routine Hospital Admission (non-emergent condition) 0 []  - New Admissions / Biomedical engineer / Ordering NPWT, Apligraf, etc. 0 []  - Emergency Hospital Admission (emergent condition) 0 X - Simple Discharge Coordination 1 10 []  - Complex (extensive) Discharge Coordination 0 PROCESS - Special Needs []  - Pediatric / Minor Patient Management 0 []  - Isolation Patient Management 0 []  - Hearing / Language / Visual special needs 0 []  - Assessment of Community assistance (transportation, D/C planning, etc.) 0 []  - Additional assistance / Altered mentation 0 []  - Support Surface(s) Assessment (bed, cushion, seat, etc.) 0 INTERVENTIONS - Wound Cleansing / Measurement []  - Simple Wound Cleansing - one wound 0 X - Complex Wound  Cleansing - multiple wounds 2 5 X - Wound Imaging (photographs - any number of wounds) 1 5 []  - Wound Tracing (instead of photographs)  0 []  - Simple Wound Measurement - one wound 0 X - Complex Wound Measurement - multiple wounds 2 5 INTERVENTIONS - Wound Dressings X - Small Wound Dressing one or multiple wounds 1 10 []  - Medium Wound Dressing one or multiple wounds 0 []  - Large Wound Dressing one or multiple wounds 0 []  - Application of Medications - topical 0 []  - Application of Medications - injection 0 INTERVENTIONS - Miscellaneous []  - External ear exam 0 Uballe, Deaisha D. ([id number]) []  - Specimen Collection (cultures, biopsies, blood, body fluids, etc.) 0 []  - Specimen(s) / Culture(s) sent or taken to Lab for analysis 0 []  - Patient Transfer (multiple staff / [name]) 0 []  - Simple Staple / Suture removal (25 or less) 0 []  - Complex Staple / Suture removal (26 or more) 0 []  - Hypo / Hyperglycemic Management (close monitor of Blood Glucose) 0 []  - Ankle / Brachial Index (ABI) - do not check if billed separately 0 X - Vital Signs 1 5 Has the patient been seen at the hospital within the last three years: Yes Total Score: 105 Level Of Care: New/Established - Level 3 Electronic Signature(s) Signed: 05/20/2015 4:46:32 PM By: [name] Entered By: [name] on 05/20/2015 14:36:59 Oguinn, Mertice D. ([id number]) -------------------------------------------------------------------------------- Encounter Discharge Information Details Patient Name: ELAYSIA, DEVARGAS D. Date of Service: 05/20/2015 2:00 PM Medical Record Number: [id number] Patient Account Number: [id number] Date of Birth/Sex: [birth year] (68 y.o. Female) Treating RN: [name] Primary Care Physician: Glendon Axe Other Clinician: Referring Physician: Glendon Axe Treating Physician/Extender: Frann Rider in Treatment: 15 Encounter Discharge Information Items Discharge Pain Level:  0 Discharge Condition: Stable Ambulatory Status: Cane Discharge Destination: Home Transportation: Private Auto Accompanied By: granddaughter Schedule Follow-up Appointment: Yes Medication Reconciliation completed No and provided to Patient/Care China Deitrick: Clinical Summary of Care: Electronic Signature(s) Signed: 05/20/2015 4:46:32 PM By: [name] Entered By: [name] on 05/20/2015 14:38:00 Caetano, Coriana D. ([id number]) -------------------------------------------------------------------------------- Lower Extremity Assessment Details Patient Name: [name] Date of Service: 05/20/2015 2:00 PM Medical Record Number: [id number] Patient Account Number: [id number] Date of Birth/Sex: 1946/03/13 (68 y.o. Female) Treating RN: [name] Primary Care Physician: Glendon Axe Other Clinician: Referring Physician: Glendon Axe Treating Physician/Extender: Frann Rider in Treatment: 15 Edema Assessment Assessed: [Left: No] [Right: No] E[Left: dema] [Right: :] Calf Left: Right: Point of Measurement: 33 cm From Medial Instep 37.5 cm 37.3 cm Ankle Left: Right: Point of Measurement: 12 cm From Medial Instep 23.3 cm 22.4 cm Vascular Assessment Pulses: Posterior Tibial Dorsalis Pedis Palpable: [Left:Yes] [Right:Yes] Extremity colors, hair growth, and conditions: Extremity Color: [Left:Mottled] [Right:Mottled] Hair Growth on Extremity: [Left:Yes] [Right:Yes] Temperature of Extremity: [Left:Warm] [Right:Warm] Capillary Refill: [Left:< 3 seconds] [Right:< 3 seconds] Electronic Signature(s) Signed: 05/20/2015 4:46:32 PM By: [name] Entered By: [name] on 05/20/2015 14:25:04 Walthour, Noemy D. ([id number]) -------------------------------------------------------------------------------- Multi Wound Chart Details Patient Name: [name] Date of Service: 05/20/2015 2:00 PM Medical Record Number: [id number] Patient Account Number: [id number] Date of  Birth/Sex: 06-16-1946 (68 y.o. Female) Treating RN: [name] Primary Care Physician: Glendon Axe Other Clinician: Referring Physician: Glendon Axe Treating Physician/Extender: Frann Rider in Treatment: 15 Vital Signs Height(in): 65 Pulse(bpm): 66 Weight(lbs): 248 Blood Pressure 134/55 (mmHg): Body Mass Index(BMI): 41 Temperature(F): 98.2 Respiratory Rate 18 (breaths/min): Photos: [2:No Photos] [5:No Photos] [N/A:N/A] Wound Location: [2:Left Lower Leg - Distal Right Lower Leg -] [5:Posterior] [N/A:N/A] Wounding Event: [2:Blister] [5:Gradually Appeared] [N/A:N/A] Primary Etiology: [2:Venous Leg Ulcer] [5:Venous Leg Ulcer] [N/A:N/A]  Comorbid History: [2:Cataracts, Asthma, Coronary Artery Disease, Coronary Artery Disease, Hypertension, Type II Diabetes, Osteoarthritis, Diabetes, Osteoarthritis, Neuropathy] [5:Cataracts, Asthma, Hypertension, Type II Neuropathy] [N/A:N/A] Date Acquired: [2:12/30/2014] [5:04/29/2015] [N/A:N/A] Weeks of Treatment: [2:15] [5:0] [N/A:N/A] Wound Status: [2:Open] [5:Open] [N/A:N/A] Clustered Wound: [2:Yes] [5:No] [N/A:N/A] Measurements L x W x D 6.3x4.6x0.6 [5:0.4x0.6x0.1] [N/A:N/A] (cm) Area (cm) : [2:22.761] [5:0.188] [N/A:N/A] Volume (cm) : [2:13.657] [5:0.019] [N/A:N/A] % Reduction in Area: [2:-3119.40%] [5:0.00%] [N/A:N/A] % Reduction in Volume: -19135.20% [5:0.00%] [N/A:N/A] Classification: [2:Full Thickness Without Exposed Support Structures] [5:Partial Thickness] [N/A:N/A] HBO Classification: [2:Grade 1] [5:Grade 1] [N/A:N/A] Exudate Amount: [2:Medium] [5:None Present] [N/A:N/A] Exudate Type: [2:Serosanguineous] [5:N/A] [N/A:N/A] Exudate Color: [2:red, brown] [5:N/A] [N/A:N/A] Wound Margin: [2:Distinct, outline attached Flat and Intact] [N/A:N/A] Granulation Amount: [2:None Present (0%)] [5:None Present (0%)] [N/A:N/A] Necrotic Amount: [2:Large (67-100%)] [5:Large (67-100%)] [N/A:N/A] Necrotic Tissue: Eschar, Adherent  Slough Eschar N/A Exposed Structures: Fascia: No Fascia: No N/A Fat: No Fat: No Tendon: No Tendon: No Muscle: No Muscle: No Joint: No Joint: No Bone: No Bone: No Limited to Skin Limited to Skin Breakdown Breakdown Epithelialization: None None N/A Periwound Skin Texture: Edema: Yes Edema: No N/A Excoriation: No Excoriation: No Induration: No Induration: No Callus: No Callus: No Crepitus: No Crepitus: No Fluctuance: No Fluctuance: No Friable: No Friable: No Rash: No Rash: No Scarring: No Scarring: No Periwound Skin Moist: Yes Maceration: No N/A Moisture: Maceration: No Moist: No Dry/Scaly: No Dry/Scaly: No Periwound Skin Color: Hemosiderin Staining: Yes Hemosiderin Staining: Yes N/A Atrophie Blanche: No Atrophie Blanche: No Cyanosis: No Cyanosis: No Ecchymosis: No Ecchymosis: No Erythema: No Erythema: No Mottled: No Mottled: No Pallor: No Pallor: No Rubor: No Rubor: No Temperature: No Abnormality N/A N/A Tenderness on Yes No N/A Palpation: Wound Preparation: Ulcer Cleansing: Ulcer Cleansing: N/A Rinsed/Irrigated with Rinsed/Irrigated with Saline Saline Topical Anesthetic Topical Anesthetic Applied: Other: lidocaine Applied: Other: lidocaine 4% 4% Treatment Notes Electronic Signature(s) Signed: 05/20/2015 4:46:32 PM By: [name] Entered By: [name] on 05/20/2015 14:35:12 Pancoast, Lucresha D. ([id number]) -------------------------------------------------------------------------------- [place] Details Patient Name: SHATIKA, GRINNELL D. Date of Service: 05/20/2015 2:00 PM Medical Record Number: [id number] Patient Account Number: [id number] Date of Birth/Sex: 05/14/1946 (68 y.o. Female) Treating RN: [name] Primary Care Physician: Glendon Axe Other Clinician: Referring Physician: Glendon Axe Treating Physician/Extender: Frann Rider in Treatment: 15 Active Inactive Orientation to the Wound Care  Program Nursing Diagnoses: Knowledge deficit related to the wound healing center program Goals: Patient/caregiver will verbalize understanding of the [facility] Program Date Initiated: 01/31/2015 Goal Status: Active Interventions: Provide education on orientation to the wound center Notes: Venous Leg Ulcer Nursing Diagnoses: Potential for venous Insuffiency (use before diagnosis confirmed) Goals: Non-invasive venous studies are completed as ordered Date Initiated: 01/31/2015 Goal Status: Active Patient/caregiver will verbalize understanding of disease process and disease management Date Initiated: 01/31/2015 Goal Status: Active Interventions: Assess peripheral edema status every visit. Notes: Wound/Skin Impairment Nursing Diagnoses: Impaired tissue integrity Knowledge deficit related to smoking impact on wound healing Timm, Amiree D. ([id number]) Goals: Patient/caregiver will verbalize understanding of skin care regimen Date Initiated: 01/31/2015 Goal Status: Active Ulcer/skin breakdown will heal within 14 weeks Date Initiated: 01/31/2015 Goal Status: Active Interventions: Assess ulceration(s) every visit Notes: Electronic Signature(s) Signed: 05/20/2015 4:46:32 PM By: [name] Entered By: [name] on 05/20/2015 14:35:04 Kretsch, Ronniesha D. ([id number]) -------------------------------------------------------------------------------- Patient/Caregiver Education Details Patient Name: [name] Date of Service: 05/20/2015 2:00 PM Medical Record Number: [id number] Patient Account Number: [id number] Date of Birth/Gender: 02-03-1946 (68 y.o. Female) Treating RN:  [name] Primary Care Physician: Glendon Axe Other Clinician: Referring Physician: Glendon Axe Treating Physician/Extender: Frann Rider in Treatment: 15 Education Assessment Education Provided To: Patient Education Topics Provided Wound/Skin Impairment: Handouts: Other: wound  care to new wound Methods: Demonstration, Explain/Verbal Responses: State content correctly Electronic Signature(s) Signed: 05/20/2015 4:46:32 PM By: [name] Entered By: [name] on 05/20/2015 14:38:17 Sedano, [name] D. ([id number]) -------------------------------------------------------------------------------- Wound Assessment Details Patient Name: [name] Date of Service: 05/20/2015 2:00 PM Medical Record Number: [id number] Patient Account Number: [id number] Date of Birth/Sex: [birth year] (68 y.o. Female) Treating RN: [name] Primary Care Physician: Glendon Axe Other Clinician: Referring Physician: Glendon Axe Treating Physician/Extender: Frann Rider in Treatment: 15 Wound Status Wound Number: 2 Primary Venous Leg Ulcer Etiology: Wound Location: Left Lower Leg - Distal Wound Open Wounding Event: Blister Status: Date Acquired: 12/30/2014 Comorbid Cataracts, Asthma, Coronary Artery Weeks Of Treatment: 15 History: Disease, Hypertension, Type II Clustered Wound: Yes Diabetes, Osteoarthritis, Neuropathy Photos Photo Uploaded By: [name] on 05/20/2015 16:43:58 Wound Measurements Length: (cm) 6.3 Width: (cm) 4.6 Depth: (cm) 0.6 Area: (cm) 22.761 Volume: (cm) 13.657 % Reduction in Area: -3119.4% % Reduction in Volume: -19135.2% Epithelialization: None Tunneling: No Undermining: No Wound Description Full Thickness Without Exposed Foul Odor A Classification: Support Structures Diabetic Severity Grade 1 (Wagner): Wound Margin: Distinct, outline attached Exudate Amount: Medium Exudate Type: Serosanguineous Exudate Color: red, brown fter Cleansing: No Wound Bed Granulation Amount: None Present (0%) Exposed Structure Nadeau, Shirlie D. ([id number]) Necrotic Amount: Large (67-100%) Fascia Exposed: No Necrotic Quality: Eschar, Adherent Slough Fat Layer Exposed: No Tendon Exposed: No Muscle Exposed: No Joint Exposed: No Bone  Exposed: No Limited to Skin Breakdown Periwound Skin Texture Texture Color No Abnormalities Noted: No No Abnormalities Noted: No Callus: No Atrophie Blanche: No Crepitus: No Cyanosis: No Excoriation: No Ecchymosis: No Fluctuance: No Erythema: No Friable: No Hemosiderin Staining: Yes Induration: No Mottled: No Localized Edema: Yes Pallor: No Rash: No Rubor: No Scarring: No Temperature / Pain Moisture Temperature: No Abnormality No Abnormalities Noted: No Tenderness on Palpation: Yes Dry / Scaly: No Maceration: No Moist: Yes Wound Preparation Ulcer Cleansing: Rinsed/Irrigated with Saline Topical Anesthetic Applied: Other: lidocaine 4%, Treatment Notes Wound #2 (Left, Distal Lower Leg) 1. Cleansed with: Clean wound with Normal Saline 2. Anesthetic Topical Lidocaine 4% cream to wound bed prior to debridement 4. Dressing Applied: Santyl Ointment 5. Secondary Dressing Applied ABD and Kerlix/Conform 7. Secured with Recruitment consultant) Signed: 05/20/2015 4:46:32 PM By: [name] Entered By: [name] on 05/20/2015 14:29:17 Popko, Rosanne D. ([id number]) Mikel, Laree D. ([id number]) -------------------------------------------------------------------------------- Wound Assessment Details Patient Name: Kleve, Karter D. Date of Service: 05/20/2015 2:00 PM Medical Record Number: [id number] Patient Account Number: [id number] Date of Birth/Sex: 05-27-1946 (68 y.o. Female) Treating RN: [name] Primary Care Physician: Glendon Axe Other Clinician: Referring Physician: Glendon Axe Treating Physician/Extender: Frann Rider in Treatment: 15 Wound Status Wound Number: 5 Primary Venous Leg Ulcer Etiology: Wound Location: Right Lower Leg - Posterior Wound Open Wounding Event: Gradually Appeared Status: Date Acquired: 04/29/2015 Comorbid Cataracts, Asthma, Coronary Artery Weeks Of Treatment: 0 History: Disease, Hypertension, Type  II Clustered Wound: No Diabetes, Osteoarthritis, Neuropathy Photos Photo Uploaded By: [name] on 05/20/2015 16:43:59 Wound Measurements Length: (cm) 0.4 Width: (cm) 0.6 Depth: (cm) 0.1 Area: (cm) 0.188 Volume: (cm) 0.019 % Reduction in Area: 0% % Reduction in Volume: 0% Epithelialization: None Tunneling: No Undermining: No Wound Description Classification: Partial Thickness Diabetic Severity Earleen Newport): Grade 1 Wound Margin: Flat and  Intact Exudate Amount: None Present Wound Bed Granulation Amount: None Present (0%) Exposed Structure Necrotic Amount: Large (67-100%) Fascia Exposed: No Necrotic Quality: Eschar Fat Layer Exposed: No Tendon Exposed: No Muscle Exposed: No Himebaugh, Sidda D. ([id number]) Joint Exposed: No Bone Exposed: No Limited to Skin Breakdown Periwound Skin Texture Texture Color No Abnormalities Noted: No No Abnormalities Noted: No Callus: No Atrophie Blanche: No Crepitus: No Cyanosis: No Excoriation: No Ecchymosis: No Fluctuance: No Erythema: No Friable: No Hemosiderin Staining: Yes Induration: No Mottled: No Localized Edema: No Pallor: No Rash: No Rubor: No Scarring: No Moisture No Abnormalities Noted: No Dry / Scaly: No Maceration: No Moist: No Wound Preparation Ulcer Cleansing: Rinsed/Irrigated with Saline Topical Anesthetic Applied: Other: lidocaine 4%, Treatment Notes Wound #5 (Right, Posterior Lower Leg) 1. Cleansed with: Clean wound with Normal Saline 2. Anesthetic Topical Lidocaine 4% cream to wound bed prior to debridement 4. Dressing Applied: Other dressing (specify in notes) Notes betadine paint Electronic Signature(s) Signed: 05/20/2015 4:46:32 PM By: [name] Entered By: [name] on 05/20/2015 14:30:11 Patton, [name] D. ([id number]) -------------------------------------------------------------------------------- [facility] Details Patient Name: [name] Date of Service: 05/20/2015 2:00  PM Medical Record Number: [id number] Patient Account Number: [id number] Date of Birth/Sex: [birth year] (68 y.o. Female) Treating RN: [name] Primary Care Physician: Glendon Axe Other Clinician: Referring Physician: Glendon Axe Treating Physician/Extender: Frann Rider in Treatment: 15 Vital Signs Time Taken: 14:14 Temperature (F): 98.2 Height (in): 65 Pulse (bpm): 66 Weight (lbs): 248 Respiratory Rate (breaths/min): 18 Body Mass Index (BMI): 41.3 Blood Pressure (mmHg): 134/55 Reference Range: 80 - 120 mg / dl Electronic Signature(s) Signed: 05/20/2015 4:46:32 PM By: [name] Entered By: [name] on 05/20/2015 14:16:39

## 2015-05-27 ENCOUNTER — Encounter: Payer: PPO | Attending: Surgery | Admitting: Surgery

## 2015-05-27 DIAGNOSIS — L97911 Non-pressure chronic ulcer of unspecified part of right lower leg limited to breakdown of skin: Secondary | ICD-10-CM | POA: Insufficient documentation

## 2015-05-27 DIAGNOSIS — I83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: Secondary | ICD-10-CM | POA: Diagnosis not present

## 2015-05-27 DIAGNOSIS — I890 Lymphedema, not elsewhere classified: Secondary | ICD-10-CM | POA: Diagnosis not present

## 2015-05-27 DIAGNOSIS — L97322 Non-pressure chronic ulcer of left ankle with fat layer exposed: Secondary | ICD-10-CM | POA: Diagnosis present

## 2015-05-27 DIAGNOSIS — E11622 Type 2 diabetes mellitus with other skin ulcer: Secondary | ICD-10-CM | POA: Insufficient documentation

## 2015-05-27 DIAGNOSIS — I83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: Secondary | ICD-10-CM | POA: Insufficient documentation

## 2015-05-29 NOTE — Progress Notes (Signed)
LYRAH, BRADT ([id number]) Visit Report for 05/27/2015 Arrival Information Details Patient Name: [name], [name] Date of Service: 05/27/2015 9:45 AM Medical Record Number: [id number] Patient Account Number: [id number] Date of Birth/Sex: 1945-12-26 (69 y.o. Female) Treating RN: [name] Primary Care Physician: Glendon Axe Other Clinician: Referring Physician: Glendon Axe Treating Physician/Extender: Frann Rider in Treatment: 16 Visit Information History Since Last Visit Added or deleted any medications: No Patient Arrived: Ambulatory Any new allergies or adverse reactions: No Arrival Time: 10:15 Had a fall or experienced change in No Accompanied By: self activities of daily living that may affect Transfer Assistance: Manual risk of falls: Patient Identification Verified: Yes Signs or symptoms of abuse/neglect since last No Secondary Verification Process Yes visito Completed: Has Dressing in Place as Prescribed: Yes Patient Has Alerts: Yes Pain Present Now: No Patient Alerts: Patient on Blood Thinner Electronic Signature(s) Signed: 05/28/2015 4:58:31 PM By: [name], RN, [name], [name], [name] Entered By: [name], RN, [name], Kim on 05/27/2015 10:15:19 Recktenwald, Tia Masker ([id number]) -------------------------------------------------------------------------------- Encounter Discharge Information Details Patient Name: [name], [name] Date of Service: 05/27/2015 9:45 AM Medical Record Number: [id number] Patient Account Number: [id number] Date of Birth/Sex: [birth year] (69 y.o. Female) Treating RN: [name] Primary Care Physician: Glendon Axe Other Clinician: Referring Physician: Glendon Axe Treating Physician/Extender: Frann Rider in Treatment: 16 Encounter Discharge Information Items Discharge Pain Level: 0 Discharge Condition: Stable Ambulatory Status: Cane Discharge Destination: Home Transportation: Private Auto Accompanied By: self Schedule Follow-up  Appointment: Yes Medication Reconciliation completed No and provided to Patient/Care Richard Holz: Provided on Clinical Summary of Care: 05/27/2015 Form Type Recipient Paper Patient LM Electronic Signature(s) Signed: 05/27/2015 10:52:37 AM By: Ruthine Dose Entered By: Ruthine Dose on 05/27/2015 10:52:36 Kath, Colisha D. ([id number]) -------------------------------------------------------------------------------- Lower Extremity Assessment Details Patient Name: Kristin Stare D. Date of Service: 05/27/2015 9:45 AM Medical Record Number: [id number] Patient Account Number: [id number] Date of Birth/Sex: [birth year] (69 y.o. Female) Treating RN: [name] Primary Care Physician: Glendon Axe Other Clinician: Referring Physician: Glendon Axe Treating Physician/Extender: Frann Rider in Treatment: 16 Edema Assessment Assessed: [Left: No] [Right: No] E[Left: dema] [Right: :] Calf Left: Right: Point of Measurement: 33 cm From Medial Instep 38.5 cm 38 cm Ankle Left: Right: Point of Measurement: 12 cm From Medial Instep 25 cm 25 cm Vascular Assessment Pulses: Posterior Tibial Dorsalis Pedis Palpable: [Left:Yes] [Right:Yes] Extremity colors, hair growth, and conditions: Extremity Color: [Left:Hyperpigmented] [Right:Normal] Hair Growth on Extremity: [Left:Yes] [Right:Yes] Temperature of Extremity: [Left:Cool] [Right:Cool] Capillary Refill: [Left:< 3 seconds] [Right:< 3 seconds] Toe Nail Assessment Left: Right: Thick: No No Discolored: No No Deformed: No No Improper Length and Hygiene: No No Electronic Signature(s) Signed: 05/28/2015 4:58:31 PM By: [name], RN, [name], [name], [name] Entered By: [name], RN, [name], Kim on 05/27/2015 10:19:29 Lever, Jalyiah D. ([id number]) -------------------------------------------------------------------------------- Multi Wound Chart Details Patient Name: Kristin Stare D. Date of Service: 05/27/2015 9:45 AM Medical Record Number: [id number] Patient Account  Number: [id number] Date of Birth/Sex: 05-29-1946 (69 y.o. Female) Treating RN: [name] Primary Care Physician: Glendon Axe Other Clinician: Referring Physician: Glendon Axe Treating Physician/Extender: Frann Rider in Treatment: 16 Vital Signs Height(in): 65 Pulse(bpm): 66 Weight(lbs): 248 Blood Pressure 144/55 (mmHg): Body Mass Index(BMI): 41 Temperature(F): 97.7 Respiratory Rate 18 (breaths/min): Photos: [2:No Photos] [5:No Photos] [N/A:N/A] Wound Location: [2:Left Lower Leg - Distal Right Lower Leg -] [5:Posterior] [N/A:N/A] Wounding Event: [2:Blister] [5:Gradually Appeared] [N/A:N/A] Primary Etiology: [2:Venous Leg Ulcer] [5:Venous Leg Ulcer] [N/A:N/A] Comorbid History: [2:Cataracts, Asthma, Coronary Artery Disease, Coronary Artery Disease, Hypertension,  Type II Diabetes, Osteoarthritis, Diabetes, Osteoarthritis, Neuropathy] [5:Cataracts, Asthma, Hypertension, Type II Neuropathy] [N/A:N/A] Date Acquired: [2:12/30/2014] [5:04/29/2015] [N/A:N/A] Weeks of Treatment: [2:16] [5:1] [N/A:N/A] Wound Status: [2:Open] [5:Open] [N/A:N/A] Clustered Wound: [2:Yes] [5:No] [N/A:N/A] Measurements L x W x D 5.6x5x0.5 [5:0.4x0.5x0.1] [N/A:N/A] (cm) Area (cm) : [2:21.991] [5:0.157] [N/A:N/A] Volume (cm) : [2:10.996] [5:0.016] [N/A:N/A] % Reduction in Area: [2:-3010.50%] [5:16.50%] [N/A:N/A] % Reduction in Volume: -15387.30% [5:15.80%] [N/A:N/A] Classification: [2:Full Thickness Without Exposed Support Structures] [5:Partial Thickness] [N/A:N/A] HBO Classification: [2:Grade 1] [5:Grade 1] [N/A:N/A] Exudate Amount: [2:Medium] [5:None Present] [N/A:N/A] Exudate Type: [2:Serosanguineous] [5:N/A] [N/A:N/A] Exudate Color: [2:red, brown] [5:N/A] [N/A:N/A] Foul Odor After [2:Yes] [5:N/A] [N/A:N/A] Cleansing: [2:No] [5:N/A] [N/A:N/A] Odor Anticipated Due to Product Use: Wound Margin: Distinct, outline attached Flat and Intact N/A Granulation Amount: None Present (0%) None Present  (0%) N/A Necrotic Amount: Large (67-100%) Large (67-100%) N/A Necrotic Tissue: Eschar, Adherent Slough Eschar N/A Exposed Structures: Fascia: No Fascia: No N/A Fat: No Fat: No Tendon: No Tendon: No Muscle: No Muscle: No Joint: No Joint: No Bone: No Bone: No Limited to Skin Limited to Skin Breakdown Breakdown Epithelialization: None None N/A Periwound Skin Texture: Edema: Yes Edema: No N/A Excoriation: No Excoriation: No Induration: No Induration: No Callus: No Callus: No Crepitus: No Crepitus: No Fluctuance: No Fluctuance: No Friable: No Friable: No Rash: No Rash: No Scarring: No Scarring: No Periwound Skin Moist: Yes Maceration: No N/A Moisture: Maceration: No Moist: No Dry/Scaly: No Dry/Scaly: No Periwound Skin Color: Hemosiderin Staining: Yes Hemosiderin Staining: Yes N/A Atrophie Blanche: No Atrophie Blanche: No Cyanosis: No Cyanosis: No Ecchymosis: No Ecchymosis: No Erythema: No Erythema: No Mottled: No Mottled: No Pallor: No Pallor: No Rubor: No Rubor: No Temperature: No Abnormality N/A N/A Tenderness on Yes No N/A Palpation: Wound Preparation: Ulcer Cleansing: Ulcer Cleansing: N/A Rinsed/Irrigated with Rinsed/Irrigated with Saline Saline Topical Anesthetic Topical Anesthetic Applied: Other: lidocaine Applied: None 4% Treatment Notes Electronic Signature(s) KATALIN, COLLEDGE ([id number]) Signed: 05/28/2015 4:58:31 PM By: [name], RN, [name], [name], [name] Entered By: [name], RN, [name], Kim on 05/27/2015 10:30:05 Fross, Tia Masker ([id number]) -------------------------------------------------------------------------------- Multi-Disciplinary Care Plan Details Patient Name: KERAH, HARDEBECK D. Date of Service: 05/27/2015 9:45 AM Medical Record Number: [id number] Patient Account Number: [id number] Date of Birth/Sex: Feb 18, 1946 (69 y.o. Female) Treating RN: [name] Primary Care Physician: Glendon Axe Other Clinician: Referring Physician: Glendon Axe Treating Physician/Extender: Frann Rider in Treatment: 16 Active Inactive Orientation to the Wound Care Program Nursing Diagnoses: Knowledge deficit related to the wound healing center program Goals: Patient/caregiver will verbalize understanding of the [place] Program Date Initiated: 01/31/2015 Goal Status: Active Interventions: Provide education on orientation to the wound center Notes: Venous Leg Ulcer Nursing Diagnoses: Potential for venous Insuffiency (use before diagnosis confirmed) Goals: Non-invasive venous studies are completed as ordered Date Initiated: 01/31/2015 Goal Status: Active Patient/caregiver will verbalize understanding of disease process and disease management Date Initiated: 01/31/2015 Goal Status: Active Interventions: Assess peripheral edema status every visit. Notes: Wound/Skin Impairment Nursing Diagnoses: Impaired tissue integrity Knowledge deficit related to smoking impact on wound healing Capelli, Bernarda D. ([id number]) Goals: Patient/caregiver will verbalize understanding of skin care regimen Date Initiated: 01/31/2015 Goal Status: Active Ulcer/skin breakdown will heal within 14 weeks Date Initiated: 01/31/2015 Goal Status: Active Interventions: Assess ulceration(s) every visit Notes: Electronic Signature(s) Signed: 05/28/2015 4:58:31 PM By: [name], RN, [name], [name], [name] Entered By: [name], RN, [name], Kim on 05/27/2015 10:29:36 Gashi, Naomii DMarland Kitchen ([id number]) -------------------------------------------------------------------------------- Pain Assessment Details Patient Name: GEENA, WEINHOLD D. Date of Service: 05/27/2015  9:45 AM Medical Record Number: [id number] Patient Account Number: [id number] Date of Birth/Sex: 30-Sep-1946 (69 y.o. Female) Treating RN: [name] Primary Care Physician: Glendon Axe Other Clinician: Referring Physician: Glendon Axe Treating Physician/Extender: Frann Rider in Treatment: 16 Active  Problems Location of Pain Severity and Description of Pain Patient Has Paino No Site Locations Pain Management and Medication Current Pain Management: Electronic Signature(s) Signed: 05/28/2015 4:58:31 PM By: [name], RN, [name], [name], [name] Entered By: [name], RN, [name], Kim on 05/27/2015 10:15:25 Mesquita, Tia Masker ([id number]) -------------------------------------------------------------------------------- Patient/Caregiver Education Details Patient Name: Kristin Stare D. Date of Service: 05/27/2015 9:45 AM Medical Record Number: [id number] Patient Account Number: [id number] Date of Birth/Gender: [birth year] (69 y.o. Female) Treating RN: [name] Primary Care Physician: Glendon Axe Other Clinician: Referring Physician: Glendon Axe Treating Physician/Extender: Frann Rider in Treatment: 16 Education Assessment Education Provided To: Patient Education Topics Provided Venous: Handouts: Other: compression hose every day morning until night Methods: Explain/Verbal Responses: State content correctly Electronic Signature(s) Signed: 05/27/2015 5:09:34 PM By: [name] Entered By: [name] on 05/27/2015 10:52:23 Wing, Bessy D. ([id number]) -------------------------------------------------------------------------------- Wound Assessment Details Patient Name: Benge, Beverlie D. Date of Service: 05/27/2015 9:45 AM Medical Record Number: [id number] Patient Account Number: [id number] Date of Birth/Sex: Mar 21, 1946 (69 y.o. Female) Treating RN: [name] Primary Care Physician: Glendon Axe Other Clinician: Referring Physician: Glendon Axe Treating Physician/Extender: Frann Rider in Treatment: 16 Wound Status Wound Number: 2 Primary Venous Leg Ulcer Etiology: Wound Location: Left Lower Leg - Distal Wound Open Wounding Event: Blister Status: Date Acquired: 12/30/2014 Comorbid Cataracts, Asthma, Coronary Artery Weeks Of Treatment: 16 History: Disease,  Hypertension, Type II Clustered Wound: Yes Diabetes, Osteoarthritis, Neuropathy Wound Measurements Length: (cm) 5.6 Width: (cm) 5 Depth: (cm) 0.5 Area: (cm) 21.991 Volume: (cm) 10.996 % Reduction in Area: -3010.5% % Reduction in Volume: -15387.3% Epithelialization: None Wound Description Full Thickness Without Exposed Foul Odor Classification: Support Structures Due to Pro Diabetic Severity Grade 1 (Wagner): Wound Margin: Distinct, outline attached Exudate Amount: Medium Exudate Type: Serosanguineous Exudate Color: red, brown After Cleansing: Yes duct Use: No Wound Bed Granulation Amount: None Present (0%) Exposed Structure Necrotic Amount: Large (67-100%) Fascia Exposed: No Necrotic Quality: Eschar, Adherent Slough Fat Layer Exposed: No Tendon Exposed: No Muscle Exposed: No Joint Exposed: No Bone Exposed: No Limited to Skin Breakdown Periwound Skin Texture Texture Color No Abnormalities Noted: No No Abnormalities Noted: No Duskin, Sharece D. ([id number]) Callus: No Atrophie Blanche: No Crepitus: No Cyanosis: No Excoriation: No Ecchymosis: No Fluctuance: No Erythema: No Friable: No Hemosiderin Staining: Yes Induration: No Mottled: No Localized Edema: Yes Pallor: No Rash: No Rubor: No Scarring: No Temperature / Pain Moisture Temperature: No Abnormality No Abnormalities Noted: No Tenderness on Palpation: Yes Dry / Scaly: No Maceration: No Moist: Yes Wound Preparation Ulcer Cleansing: Rinsed/Irrigated with Saline Topical Anesthetic Applied: Other: lidocaine 4%, Treatment Notes Wound #2 (Left, Distal Lower Leg) 1. Cleansed with: Clean wound with Normal Saline 2. Anesthetic Topical Lidocaine 4% cream to wound bed prior to debridement 3. Peri-wound Care: Skin Prep 4. Dressing Applied: Santyl Ointment 5. Secondary Dressing Applied Bordered Foam Dressing Dry Gauze Electronic Signature(s) Signed: 05/28/2015 4:58:31 PM By: [name], RN, [name], [name],  [name] Entered By: [name], RN, [name], Kim on 05/27/2015 10:25:00 Giebel, Tia Masker ([id number]) -------------------------------------------------------------------------------- Wound Assessment Details Patient Name: CECELY, RENGEL D. Date of Service: 05/27/2015 9:45 AM Medical Record Number: [id number] Patient Account Number: [id number] Date of Birth/Sex: May 04, 1946 (69 y.o. Female) Treating RN: [name] Primary Care Physician: [facility],  [name] Other Clinician: Referring Physician: Glendon Axe Treating Physician/Extender: Frann Rider in Treatment: 16 Wound Status Wound Number: 5 Primary Venous Leg Ulcer Etiology: Wound Location: Right Lower Leg - Posterior Wound Open Wounding Event: Gradually Appeared Status: Date Acquired: 04/29/2015 Comorbid Cataracts, Asthma, Coronary Artery Weeks Of Treatment: 1 History: Disease, Hypertension, Type II Clustered Wound: No Diabetes, Osteoarthritis, Neuropathy Wound Measurements Length: (cm) 0.4 Width: (cm) 0.5 Depth: (cm) 0.1 Area: (cm) 0.157 Volume: (cm) 0.016 % Reduction in Area: 16.5% % Reduction in Volume: 15.8% Epithelialization: None Wound Description Classification: Partial Thickness Diabetic Severity (Wagner): Grade 1 Wound Margin: Flat and Intact Exudate Amount: None Present Wound Bed Granulation Amount: None Present (0%) Exposed Structure Necrotic Amount: Large (67-100%) Fascia Exposed: No Necrotic Quality: Eschar Fat Layer Exposed: No Tendon Exposed: No Muscle Exposed: No Joint Exposed: No Bone Exposed: No Limited to Skin Breakdown Periwound Skin Texture Texture Color No Abnormalities Noted: No No Abnormalities Noted: No Callus: No Atrophie Blanche: No Crepitus: No Cyanosis: No Excoriation: No Ecchymosis: No Fluctuance: No Erythema: No Gens, Mabelle D. ([id number]) Friable: No Hemosiderin Staining: Yes Induration: No Mottled: No Localized Edema: No Pallor: No Rash: No Rubor: No Scarring:  No Moisture No Abnormalities Noted: No Dry / Scaly: No Maceration: No Moist: No Wound Preparation Ulcer Cleansing: Rinsed/Irrigated with Saline Topical Anesthetic Applied: None Treatment Notes Wound #5 (Right, Posterior Lower Leg) 1. Cleansed with: Clean wound with Normal Saline 4. Dressing Applied: Other dressing (specify in notes) Notes betadine paint Electronic Signature(s) Signed: 05/28/2015 4:58:31 PM By: [name], RN, [name], [name], [name] Entered By: [name], RN, [name], Kim on 05/27/2015 10:25:11 Santone, Tia Masker ([id number]) -------------------------------------------------------------------------------- Vitals Details Patient Name: Kristin Stare D. Date of Service: 05/27/2015 9:45 AM Medical Record Number: [id number] Patient Account Number: [id number] Date of Birth/Sex: 1946-01-31 (69 y.o. Female) Treating RN: [name] Primary Care Physician: Glendon Axe Other Clinician: Referring Physician: Glendon Axe Treating Physician/Extender: Frann Rider in Treatment: 16 Vital Signs Time Taken: 10:15 Temperature (F): 97.7 Height (in): 65 Pulse (bpm): 66 Weight (lbs): 248 Respiratory Rate (breaths/min): 18 Body Mass Index (BMI): 41.3 Blood Pressure (mmHg): 144/55 Reference Range: 80 - 120 mg / dl Electronic Signature(s) Signed: 05/28/2015 4:58:31 PM By: [name], RN, [name], [name], [name] Entered By: [name], RN, [name], Kim on 05/27/2015 10:15:47

## 2015-05-29 NOTE — Progress Notes (Signed)
[name], [name] ([id number]) Visit Report for 05/27/2015 Chief Complaint Document Details Patient Name: [name], [name] Date of Service: 05/27/2015 9:45 AM Medical Record Number: [id number] Patient Account Number: [id number] Date of Birth/Sex: [birth year] (69 y.o. Female) Treating RN: Primary Care Physician: Glendon Axe Other Clinician: Referring Physician: Glendon Axe Treating Physician/Extender: Frann Rider in Treatment: 16 Information Obtained from: Patient Chief Complaint Patient presents to the wound care center for a consult due non healing wound 69 year old patient comes with a history of having a ulcer on the left lower extremity for the past 4 weeks. she says she's had swelling of both lower extremities for about a year after she started having prednisone. 02/07/2015 -- her vascular appointments obtained were in the first and third week of June. she is able to go to [facility] and we will try and get her some earlier appointments. Other than that nothing else has changed in her management. Electronic Signature(s) Signed: 05/27/2015 10:37:57 AM By: [name], [name] Entered By: [name] on 05/27/2015 10:37:57 [name], [name] ([id number]) -------------------------------------------------------------------------------- Debridement Details Patient Name: [name] Date of Service: 05/27/2015 9:45 AM Medical Record Number: [id number] Patient Account Number: [id number] Date of Birth/Sex: 03-26-1946 (69 y.o. Female) Treating RN: Primary Care Physician: Glendon Axe Other Clinician: Referring Physician: Glendon Axe Treating Physician/Extender: Frann Rider in Treatment: 16 Debridement Performed for Wound #2 Left,Distal Lower Leg Assessment: Performed By: Physician Pat Patrick., MD Debridement: Debridement Pre-procedure Yes Verification/Time Out Taken: Start Time: 10:30 Pain Control: Other : lidocaine 4% Level: Skin/Subcutaneous  Tissue Total Area Debrided (L x 5.6 (cm) x 5 (cm) = 28 (cm) W): Tissue and other Viable, Non-Viable, Eschar, Exudate, Fibrin/Slough, Subcutaneous material debrided: Instrument: Forceps, Scissors Bleeding: None End Time: 10:35 Procedural Pain: 2 Post Procedural Pain: 3 Response to Treatment: Procedure was tolerated well Post Debridement Measurements of Total Wound Length: (cm) 5.6 Width: (cm) 5 Depth: (cm) 0.6 Volume: (cm) 13.195 Electronic Signature(s) Signed: 05/27/2015 10:37:50 AM By: [name], [name] Entered By: [name] on 05/27/2015 10:37:50 [name], [name] ([id number]) -------------------------------------------------------------------------------- HPI Details Patient Name: [name] Date of Service: 05/27/2015 9:45 AM Medical Record Number: [id number] Patient Account Number: [id number] Date of Birth/Sex: 02-08-1946 (69 y.o. Female) Treating RN: Primary Care Physician: Glendon Axe Other Clinician: Referring Physician: Glendon Axe Treating Physician/Extender: Frann Rider in Treatment: 77 History of Present Illness HPI Description: 69 year old patient who is known to have diabetes mellitus type 2, chronic renal insufficiency, coronary artery disease, hypertension, hypercholesterolemia, temporal arteritis and inflammatory arthritiss also has a history of having a hysterectomy and some orthopedic related surgeries. The ulcer on the left lower extremity started off as a blister and then. Got progressively worse. She does not have any fever or chills and has not had any recent surgical intervention for this. Her last hemoglobin A1c was 10.1 in September 2015. She has been recently put on doxycycline by her PCP. She is now also allergic to doxycycline and was this was changed over to Keflex. 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 She does  see a cardiologist and also takes a diuretic. 02/07/2015 her arterial and venous duplex studies to be done have dates been given as the first and third week of June. This is at [facility]. We are going to try and get early appointments at [facility]. other than that nothing has changed in  her management. 02/14/2015 -- we have been able to get her an appointment in [facility] on May 20 which is much earlier than her previous ones at [place]. She continues with her prednisone and her sugars are in the range of 150-200. 02/21/2015 We were able to get a vascular lab workup for her today and she is going to be there at 2:00 this afternoon. the swelling of her leg has gone down significantly but she still has some tenderness over the wounds. 02/28/2015 - She has had one of two vascular workups done, and this coming Tuesday has another, at [place] [facility]. She continues to be on steroid medications. She has significant sensitivity in her left lower extremity and has pain suggestive of neuropathic pain and I have asked her to address this with her primary care physician. 03/07/2015 -- The patient saw Dr. Lucky Cowboy for a consultation and he has had her arteries are okay but she has 2 incompetent veins on the left lower extremity and he is going to set her up for surgery. Official report is awaited. Addendum: Official reports are now available and on 03/04/2015. She was seen and lower extremity venous duplex exam was done. There was reflux present within the left greater saphenous vein below the knee and also the left small saphenous vein. Arterial duplex showed normal triphasic waveforms throughout the left lower extremity without any significant stenosis. Her ABIs were noncompressible bilaterally but a waveforms were normal and a digital pressures were normal bilaterally consistent with no significant arterial insufficiency. He has recommended endovenous ablation of both the left  small saphenous and the left great saphenous vein. This would still be scheduled later. 03/14/2015 -- she has heard back from the vascular office and has surgery scheduled for sometime in July. Her rheumatologist has decreased her prednisone dosage but she still on it. She has also had cataract surgery in her right eye recently this week. [name], [name] ([id number]) 03/20/2015 - No new complaints today. Pain improved. No fever or chills. Tolerating 2 layer compression. 04/14/2015 -- she was doing very well today she went off on vacation and now her edema has increased markedly the ulceration is bigger and her diabetes is not under control. 04/21/2015 -- I spoke to her PCP Dr. Candiss Norse and discussed the management which would include being seen by a general surgeon for debridement and taking multiple punch biopsies which would help in establishing the diagnosis of this is a vasculitis. She is agreeable about this and will set her up for the procedure with Dr. Tamala Julian at [facility]. She was seen by the surgeon Dr. Jamal Collin. 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 She is scheduled to have laser ablation done next week.Pt was sent here for possible biopsy to look for vasculitis. Feel it would be better to wait after laser ablation is completed- the ulcer may heal fully and biopsy may not be necessary 04/29/2015 -- she had the venous ablation done by Dr. Lucky Cowboy last Friday and we do not have any notes yet. She is doing fine otherwise. 05/06/2015 --Review of her recent vascular intervention shows that she was seen by Dr. Lucky Cowboy on 04/29/2015. The follow-up duplex which was done showed that both the great saphenous vein and the small saphenous vein remained patent with reflux consistent with an unsuccessful ablation. He has rescheduled her for another the endovenous ablation to be done in  about  4 weekso time. 05/13/2015 -- he was seen by her surgeon Dr. Jamal Collin who asked her to continue with conservative therapy and he would speak to Dr. Lucky Cowboy about her management. Dr. Lucky Cowboy is going to schedule her surgery in the middle of August for a repeat endovenous ablation. Her pus culture from last week has grown : [place] her noted her sensitivity report but due to her multiple allergies I had tried clindamycin and she developed a rash with this too. She has been prescribed and anti-buttocks in the ER and is has it at home and she will let is know what she is going to be taking. 05/20/2015 -- she has developed a small spot on her right lower extremity but besides that it is not a full fledged ulceration. She did not get to see Dr. Lucky Cowboy last week and hopefully she will see him in the near future. 05/27/2015 -she is still awaiting her appointment with Dr. deal and her vascular procedure is not scheduled until August 19. She will be seeing her PCP tomorrow and I have asked her to convey our discussion so that she is aware that debridement has not been done yet. Electronic Signature(s) Signed: 05/27/2015 10:38:38 AM By: [name], [name] Entered By: [name] on 05/27/2015 10:38:38 [name], [name] Kitchen ([id number]) -------------------------------------------------------------------------------- Physical Exam Details Patient Name: [name], [name] Date of Service: 05/27/2015 9:45 AM Medical Record Number: [id number] Patient Account Number: [id number] Date of Birth/Sex: 11-Feb-1946 (69 y.o. Female) Treating RN: Primary Care Physician: Glendon Axe Other Clinician: Referring Physician: Glendon Axe Treating Physician/Extender: Frann Rider in Treatment: 16 Constitutional . Pulse regular. Respirations normal and unlabored. Afebrile. . Eyes Nonicteric. Reactive to light. Ears, Nose,  Mouth, and Throat Lips, teeth, and gums WNL.Marland Kitchen Moist mucosa without lesions . Neck supple and nontender. No palpable supraclavicular or cervical adenopathy. Normal sized without goiter. Respiratory WNL. No retractions.. Cardiovascular Pedal Pulses WNL. No clubbing, cyanosis or edema. Chest Breasts symmetical and no nipple discharge.. Breast tissue WNL, no masses, lumps, or tenderness.. Lymphatic No adneopathy. No adenopathy. No adenopathy. Musculoskeletal Adexa without tenderness or enlargement.. Digits and nails w/o clubbing, cyanosis, infection, petechiae, ischemia, or inflammatory conditions.. Integumentary (Hair, Skin) No suspicious lesions. No crepitus or fluctuance. No peri-wound warmth or erythema. No masses.Marland Kitchen Psychiatric Judgement and insight Intact.. No evidence of depression, anxiety, or agitation.. Notes The wound continues to have a lot of necrotic debris on the left lower extremity and this will be sharply debrided with forceps and scissors Electronic Signature(s) Signed: 05/27/2015 10:39:11 AM By: [name], [name] Entered By: [name] on 05/27/2015 10:39:11 [name], [name] ([id number]) -------------------------------------------------------------------------------- Physician Orders Details Patient Name: [name] Date of Service: 05/27/2015 9:45 AM Medical Record Number: [id number] Patient Account Number: [id number] Date of Birth/Sex: [birth year] (69 y.o. Female) Treating RN: [name] Primary Care Physician: Glendon Axe Other Clinician: Referring Physician: Glendon Axe Treating Physician/Extender: Frann Rider in Treatment: 16 Verbal / Phone Orders: Yes Clinician: [name] Read Back and Verified: Yes Diagnosis Coding Wound Cleansing Wound #2 Left,Distal Lower Leg o Clean wound with Normal Saline. Wound #5 Right,Posterior Lower Leg o Clean wound with Normal Saline. Anesthetic Wound #2 Left,Distal Lower Leg o Topical  Lidocaine 4% cream applied to wound bed prior to debridement Wound #5 Right,Posterior Lower Leg o Topical Lidocaine 4% cream applied to wound bed prior to debridement Primary Wound Dressing Wound #2 Left,Distal Lower Leg o Santyl Ointment Wound #5  Right,Posterior Lower Leg o Other: - betadine paint Secondary Dressing Wound #2 Left,Distal Lower Leg o ABD and Kerlix/Conform Wound #5 Right,Posterior Lower Leg o Boardered Foam Dressing Dressing Change Frequency Wound #2 Left,Distal Lower Leg o Change dressing every day. Wound #5 Right,Posterior Lower Leg o Change dressing every day. Follow-up Appointments JENEVIEVE, KIRSCHBAUM ([id number]) Wound #2 Left,Distal Lower Leg o Return Appointment in 1 week. Wound #5 Right,Posterior Lower Leg o Return Appointment in 1 week. Edema Control Wound #2 Left,Distal Lower Leg o Support Garment 20-30 mm/Hg pressure to: Wound #5 Right,Posterior Lower Leg o Support Garment 20-30 mm/Hg pressure to: Additional Orders / Instructions Wound #2 Left,Distal Lower Leg o Increase protein intake. Wound #5 Right,Posterior Lower Leg o Increase protein intake. Medications-please add to medication list. Wound #2 Left,Distal Lower Leg o Santyl Enzymatic Ointment Electronic Signature(s) Signed: 05/27/2015 12:09:58 PM By: [name], [name] Signed: 05/28/2015 4:58:31 PM By: [name], RN, [name], [name], [name] Entered By: [name], RN, [name], Kim on 05/27/2015 10:37:23 Vivian, Tia Masker ([id number]) -------------------------------------------------------------------------------- Problem List Details Patient Name: DAWANNA, GRAUBERGER [name] Date of Service: 05/27/2015 9:45 AM Medical Record Number: [id number] Patient Account Number: [id number] Date of Birth/Sex: 04/10/1946 (69 y.o. 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 ([id number]) -------------------------------------------------------------------------------- Progress Note Details Patient Name: [name], [name] Date of Service: 05/27/2015 9:45 AM Medical Record Number: [id number] Patient Account Number: [id number] Date of Birth/Sex: 21-Jan-1946 (69 y.o. Female) Treating RN: Primary Care Physician: Glendon Axe Other Clinician: Referring Physician: Glendon Axe Treating Physician/Extender: Frann Rider in Treatment: 16 Subjective Chief Complaint Information obtained from Patient Patient presents to the wound care center for a consult due non healing wound 69 year old patient comes with a history of having a ulcer on the left lower extremity for the past 4 weeks. she says she's had swelling of both lower extremities for about a year after she started having prednisone. 02/07/2015 -- her vascular appointments obtained were in the first and third week of June. she is able to go to [place] and we will try and get her some earlier appointments. Other than that nothing else has changed in her management. History of Present Illness (HPI) 69 year old patient who is known to have diabetes mellitus type 2, chronic  renal insufficiency, coronary artery disease, hypertension, hypercholesterolemia, temporal arteritis and inflammatory arthritiss also has a history of having a hysterectomy and some orthopedic related surgeries. The ulcer on the left lower extremity started off as a blister and then. Got progressively worse. She does not have any fever or chills and has not had any recent surgical intervention for this. Her last hemoglobin A1c was 10.1 in September 2015. She has been recently put on doxycycline by her PCP. She is now also allergic to doxycycline and was this was changed over to Keflex.  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 She does see a cardiologist and also takes a diuretic. 02/07/2015 her arterial and venous duplex studies to be done have dates been given as the first and third week of June. This is at [facility]. We are going to try and get early appointments at [facility]. other than that nothing has changed in her management. 02/14/2015 -- we have been able to get her an appointment in [facility] on May 20 which is much earlier than her previous ones at [place]. She continues with her prednisone and her sugars are in the range of 150-200. 02/21/2015 We were able to get a vascular lab workup for her today and she is going to be there at 2:00 this afternoon. the swelling of her leg has gone down significantly but she still has some tenderness over the wounds. 02/28/2015 - She has had one of two vascular workups done, and this coming Tuesday has another, at [place] [facility]. She continues to be on steroid medications. She has significant sensitivity in her left lower extremity and has pain suggestive of neuropathic pain and I have asked her to address this with her primary care physician. 03/07/2015 -- The patient saw Dr. Lucky Cowboy for a consultation and he has  had her arteries are okay but she has 2 incompetent veins on the left lower extremity and he is going to set her up for surgery. Official report is awaited. Addendum: Official reports are now available and on 03/04/2015. She was seen and lower extremity Strum, Nitisha [name] ([id number]) venous duplex exam was done. There was reflux present within the left greater saphenous vein below the knee and also the left small saphenous vein. Arterial duplex showed normal triphasic waveforms throughout the left lower extremity without any significant stenosis. Her ABIs were noncompressible bilaterally but a waveforms were normal and a digital pressures were normal bilaterally consistent with no significant arterial insufficiency. He has recommended endovenous ablation of both the left small saphenous and the left great saphenous vein. This would still be scheduled later. 03/14/2015 -- she has heard back from the vascular office and has surgery scheduled for sometime in July. Her rheumatologist has decreased her prednisone dosage but she still on it. She has also had cataract surgery in her right eye recently this week. 03/20/2015 - No new complaints today. Pain improved. No fever or chills. Tolerating 2 layer compression. 04/14/2015 -- she was doing very well today she went off on vacation and now her edema has increased markedly the ulceration is bigger and her diabetes is not under control. 04/21/2015 -- I spoke to her PCP Dr. Candiss Norse and discussed the management which would include being seen by a general surgeon for debridement and taking multiple punch biopsies which would help in establishing the diagnosis of this is a vasculitis. She is agreeable about this and will set her up for the procedure with Dr. Tamala Julian at [facility]. She was seen by the surgeon Dr. Jamal Collin. His opinion was: Likely stasis ulcer left leg.Venous insufficiency- pt had venous Duplex and appears she has superficial  venous insdufficiency. She is scheduled to have laser ablation done next week.Pt was sent here for possible biopsy to look for vasculitis. Feel it would be better to wait after laser ablation is completed- the ulcer may heal fully and biopsy may not be necessary 04/29/2015 --  she had the venous ablation done by Dr. Lucky Cowboy last Friday and we do not have any notes yet. She is doing fine otherwise. 05/06/2015 --Review of her recent vascular intervention shows that she was seen by Dr. Lucky Cowboy on 04/29/2015. The follow-up duplex which was done showed that both the great saphenous vein and the small saphenous vein remained patent with reflux consistent with an unsuccessful ablation. He has rescheduled her for another the endovenous ablation to be done in about 4 weeks time. 05/13/2015 -- he was seen by her surgeon Dr. Jamal Collin who asked her to continue with conservative therapy and he would speak to Dr. Lucky Cowboy about her management. Dr. Lucky Cowboy is going to schedule her surgery in the middle of August for a repeat endovenous ablation. Her pus culture from last week has grown : [place] her noted her sensitivity report but due to her multiple allergies I had tried clindamycin and she developed a rash with this too. She has been prescribed and anti-buttocks in the ER and is has it at home and she will let is know what she is going to be taking. 05/20/2015 -- she has developed a small spot on her right lower extremity but besides that it is not a full fledged ulceration. She did not get to see Dr. Lucky Cowboy last week and hopefully she will see him in the near future. 05/27/2015 -she is still awaiting her appointment with Dr. deal and her vascular procedure is not scheduled until August 19. She will be seeing her PCP tomorrow and I have asked her to convey our discussion so that she is aware that debridement has not been done  yet. [name], [name] ([id number]) Objective Constitutional Pulse regular. Respirations normal and unlabored. Afebrile. Vitals Time Taken: 10:15 AM, Height: 65 in, Weight: 248 lbs, BMI: 41.3, Temperature: 97.7 F, Pulse: 66 bpm, Respiratory Rate: 18 breaths/min, Blood Pressure: 144/55 mmHg. Eyes Nonicteric. Reactive to light. Ears, Nose, Mouth, and Throat Lips, teeth, and gums WNL.Marland Kitchen Moist mucosa without lesions . Neck supple and nontender. No palpable supraclavicular or cervical adenopathy. Normal sized without goiter. Respiratory WNL. No retractions.. Cardiovascular Pedal Pulses WNL. No clubbing, cyanosis or edema. Chest Breasts symmetical and no nipple discharge.. Breast tissue WNL, no masses, lumps, or tenderness.. Lymphatic No adneopathy. No adenopathy. No adenopathy. Musculoskeletal Adexa without tenderness or enlargement.. Digits and nails w/o clubbing, cyanosis, infection, petechiae, ischemia, or inflammatory conditions.Marland Kitchen Psychiatric Judgement and insight Intact.. No evidence of depression, anxiety, or agitation.. General Notes: The wound continues to have a lot of necrotic debris on the left lower extremity and this will be sharply debrided with forceps and scissors Integumentary (Hair, Skin) No suspicious lesions. No crepitus or fluctuance. No peri-wound warmth or erythema. No masses.. Wound #2 status is Open. Original cause of wound was Blister. The wound is located on the Left,Distal Lower Leg. The wound measures 5.6cm length x 5cm width x 0.5cm depth; 21.991cm^2 area and 10.996cm^3 volume. The wound is limited to skin breakdown. There is a medium amount of Grigoryan, Aniza [name] ([id number]) serosanguineous drainage noted. The wound margin is distinct with the outline attached to the wound base. There is no granulation within the wound bed. There is a large (67-100%) amount of necrotic tissue within the wound bed including Eschar and Adherent Slough. The periwound skin  appearance exhibited: Localized Edema, Moist, Hemosiderin Staining. The periwound skin appearance did not exhibit: Callus, Crepitus, Excoriation, Fluctuance, Friable, Induration, Rash, Scarring, Dry/Scaly, Maceration,  Atrophie Blanche, Cyanosis, Ecchymosis, Mottled, Pallor, Rubor, Erythema. Periwound temperature was noted as No Abnormality. The periwound has tenderness on palpation. Wound #5 status is Open. Original cause of wound was Gradually Appeared. The wound is located on the Right,Posterior Lower Leg. The wound measures 0.4cm length x 0.5cm width x 0.1cm depth; 0.157cm^2 area and 0.016cm^3 volume. The wound is limited to skin breakdown. There is a none present amount of drainage noted. The wound margin is flat and intact. There is no granulation within the wound bed. There is a large (67-100%) amount of necrotic tissue within the wound bed including Eschar. The periwound skin appearance exhibited: Hemosiderin Staining. The periwound skin appearance did not exhibit: Callus, Crepitus, Excoriation, Fluctuance, Friable, Induration, Localized Edema, Rash, Scarring, Dry/Scaly, Maceration, Moist, Atrophie Blanche, Cyanosis, Ecchymosis, Mottled, Pallor, Rubor, Erythema. 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 Her right lower extremity looks good and we will continue to paint this with Betadine. She will see me back next week. Procedures Wound #2 Wound #2 is  a Venous Leg Ulcer located on the Left,Distal Lower Leg . There was a Skin/Subcutaneous Tissue Debridement ([id number]) debridement with total area of 28 sq cm performed by Kristopher Attwood, Jackson Latino., MD. with the following instrument(s): Forceps and Scissors to remove Viable and Non-Viable tissue/material including Exudate, Fibrin/Slough, Eschar, and Subcutaneous after achieving pain control using Other (lidocaine 4%). A time out was conducted prior to the start of the procedure. There was no bleeding. The [name], [name] ([id number]) procedure was tolerated well with a pain level of 2 throughout and a pain level of 3 following the procedure. Post Debridement Measurements: 5.6cm length x 5cm width x 0.6cm depth; 13.195cm^3 volume. Plan Wound Cleansing: Wound #2 Left,Distal Lower Leg: Clean wound with Normal Saline. Wound #5 Right,Posterior Lower Leg: Clean wound with Normal Saline. Anesthetic: Wound #2 Left,Distal Lower Leg: Topical Lidocaine 4% cream applied to wound bed prior to debridement Wound #5 Right,Posterior Lower Leg: Topical Lidocaine 4% cream applied to wound bed prior to debridement Primary Wound Dressing: Wound #2 Left,Distal Lower Leg: Santyl Ointment Wound #5 Right,Posterior Lower Leg: Other: - betadine paint Secondary Dressing: Wound #2 Left,Distal Lower Leg: ABD and Kerlix/Conform Wound #5 Right,Posterior Lower Leg: Boardered Foam Dressing Dressing Change Frequency: Wound #2 Left,Distal Lower Leg: Change dressing every day. Wound #5 Right,Posterior Lower Leg: Change dressing every day. Follow-up Appointments: Wound #2 Left,Distal Lower Leg: Return Appointment in 1 week. Wound #5 Right,Posterior Lower Leg: Return Appointment in 1 week. Edema Control: Wound #2 Left,Distal Lower Leg: Support Garment 20-30 mm/Hg pressure to: Wound #5 Right,Posterior Lower Leg: Support Garment 20-30 mm/Hg pressure to: Additional Orders / Instructions: Wound #2 Left,Distal Lower  Leg: Increase protein intake. Wound #5 Right,Posterior Lower Leg: Increase protein intake. Medications-please add to medication list.: AKAYSHA, COBERN. ([id number]) Wound #2 Left,Distal Lower Leg: Santyl Enzymatic Ointment I was able to remove a lot of the necrotic debris sharply and this will help Santyl ointment to be able to debride some  more chemically. Her right lower extremity looks good and we will continue to paint this with Betadine. She will see me back next week. Electronic Signature(s) Signed: 05/27/2015 10:39:57 AM By: [name], [name] Entered By: [name] on 05/27/2015 10:39:57 Saltos, Hermena [name] ([id number]) -------------------------------------------------------------------------------- SuperBill Details Patient Name: RENAE, MOTTLEY [name] Date of Service: 05/27/2015 Medical Record Number: [id number] Patient Account Number: [id number] Date of Birth/Sex: 03-Jul-1946 (69 y.o. 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 Facility Procedures CPT4: Description Modifier Quantity Code [id number] 11042 - DEB SUBQ TISSUE 20 SQ CM/< 1 ICD-10 Description Diagnosis E11.622 Type 2 diabetes mellitus with other skin ulcer L97.322 Non-pressure chronic ulcer of left ankle with fat layer exposed I83.222  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 ([id number]) Electronic Signature(s) Signed: 05/27/2015 10:40:20 AM By: [name], [name] Entered By: [name] on 05/27/2015 10:40:19

## 2015-06-03 ENCOUNTER — Encounter: Payer: PPO | Admitting: Surgery

## 2015-06-03 DIAGNOSIS — L97322 Non-pressure chronic ulcer of left ankle with fat layer exposed: Secondary | ICD-10-CM | POA: Diagnosis not present

## 2015-06-03 NOTE — Progress Notes (Addendum)
[name], SPEAK ([id number]) Visit Report for 06/03/2015 Chief Complaint Document Details Patient Name: [name], SCHIFFER 06/03/2015 10:15 Date of Service: AM Medical Record [id number] Number: Patient Account Number: [id number] 1946/10/14 (69 y.o. Treating RN: Date of Birth/Sex: Female) Other Clinician: Primary Care Physician: [facility], [name] Treating [name] Referring Physician: Glendon Axe Physician/Extender: Weeks in Treatment: 17 Information Obtained from: Patient Chief Complaint Patient presents to the wound care center for a consult due non healing wound 69 year old patient comes with a history of having a ulcer on the left lower extremity for the past 4 weeks. she says she's had swelling of both lower extremities for about a year after she started having prednisone. 02/07/2015 -- her vascular appointments obtained were in the first and third week of June. she is able to go to [place] and we will try and get her some earlier appointments. Other than that nothing else has changed in her management. Electronic Signature(s) Signed: 06/03/2015 11:01:51 AM By: [name], [name] Entered By: [name] on 06/03/2015 11:01:51 SHAILYNN, FONG ([id number]) -------------------------------------------------------------------------------- Debridement Details Patient Name: [name], [name] 06/03/2015 10:15 Date of Service: AM Medical Record [id number] Number: Patient Account Number: [id number] Oct 10, 1946 (69 y.o. Treating RN: Date of Birth/Sex: Female) Other Clinician: Primary Care Physician: [facility], [place] Taijuan Serviss Referring Physician: Glendon Axe Physician/Extender: Weeks in Treatment: 17 Debridement Performed for Wound #2 Left,Distal Lower Leg Assessment: Performed By: Physician Pat Patrick., MD Debridement: Debridement Pre-procedure Yes Verification/Time Out Taken: Start Time: 10:51 Pain Control: Lidocaine 4% Topical Solution Level:  Skin/Subcutaneous Tissue Total Area Debrided (L x 6 (cm) x 4 (cm) = 24 (cm) W): Tissue and other Viable, Non-Viable, Eschar, Fibrin/Slough, Subcutaneous material debrided: Instrument: Curette Bleeding: Minimum Hemostasis Achieved: Pressure End Time: 10:54 Procedural Pain: 0 Post Procedural Pain: 0 Response to Treatment: Procedure was tolerated well Post Debridement Measurements of Total Wound Length: (cm) 6.4 Width: (cm) 5.3 Depth: (cm) 0.5 Volume: (cm) 13.32 Electronic Signature(s) Signed: 06/03/2015 11:01:43 AM By: [name], [name] Entered By: [name] on 06/03/2015 11:01:43 Grissinger, Jaeanna DMarland Kitchen ([id number]) -------------------------------------------------------------------------------- HPI Details Patient Name: [name], [name] 06/03/2015 10:15 Date of Service: AM Medical Record [id number] Number: Patient Account Number: [id number] [birth year] (69 y.o. Treating RN: Date of Birth/Sex: Female) Other Clinician: Primary Care Physician: [facility], [name] Treating Nakisha Chai Referring Physician: Glendon Axe Physician/Extender: Weeks in Treatment: 14 History of Present Illness HPI Description: 70 year old patient who is known to have diabetes mellitus type 2, chronic renal insufficiency, coronary artery disease, hypertension, hypercholesterolemia, temporal arteritis and inflammatory arthritiss also has a history of having a hysterectomy and some orthopedic related surgeries. The ulcer on the left lower extremity started off as a blister and then. Got progressively worse. She does not have any fever or chills and has not had any recent surgical intervention for this. Her last hemoglobin A1c was 10.1 in September 2015. She has been recently put on doxycycline by her PCP. She is now also allergic to doxycycline and was this was changed over to Keflex. due to her temporal arteritis she has been on prednisone for about a year and she says ever since that she has had  swelling of both lower extremities. She does see a cardiologist and also takes a diuretic. 02/07/2015 her arterial and venous duplex studies to be done have dates been given as the first and third week of June. This is at [facility]. We are going to try and get early appointments at [facility]. other than that nothing has changed  in her management. 02/14/2015 -- we have been able to get her an appointment in [facility] on May 20 which is much earlier than her previous ones at [place]. She continues with her prednisone and her sugars are in the range of 150-200. 02/21/2015 We were able to get a vascular lab workup for her today and she is going to be there at 2:00 this afternoon. the swelling of her leg has gone down significantly but she still has some tenderness over the wounds. 02/28/2015 - She has had one of two vascular workups done, and this coming Tuesday has another, at [facility]. She continues to be on steroid medications. She has significant sensitivity in her left lower extremity and has pain suggestive of neuropathic pain and I have asked her to address this with her primary care physician. 03/07/2015 -- The patient saw Dr. Lucky Cowboy for a consultation and he has had her arteries are okay but she has 2 incompetent veins on the left lower extremity and he is going to set her up for surgery. Official report is awaited. Addendum: Official reports are now available and on 03/04/2015. She was seen and lower extremity venous duplex exam was done. There was reflux present within the left greater saphenous vein below the knee and also the left small saphenous vein. Arterial duplex showed normal triphasic waveforms throughout the left lower extremity without any significant stenosis. Her ABIs were noncompressible bilaterally but a waveforms were normal and a digital pressures were normal bilaterally consistent with no significant arterial insufficiency. He has  recommended endovenous ablation of both the left small saphenous and the left great saphenous vein. This would still be scheduled later. 03/14/2015 -- she has heard back from the vascular office and has surgery scheduled for sometime in July. PIA, JEDLICKA ([id number]) Her rheumatologist has decreased her prednisone dosage but she still on it. She has also had cataract surgery in her right eye recently this week. 03/20/2015 - No new complaints today. Pain improved. No fever or chills. Tolerating 2 layer compression. 04/14/2015 -- she was doing very well today she went off on vacation and now her edema has increased markedly the ulceration is bigger and her diabetes is not under control. 04/21/2015 -- I spoke to her PCP Dr. Candiss Norse and discussed the management which would include being seen by a general surgeon for debridement and taking multiple punch biopsies which would help in establishing the diagnosis of this is a vasculitis. She is agreeable about this and will set her up for the procedure with Dr. Tamala Julian at [facility]. She was seen by the surgeon Dr. Jamal Collin. 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 She is scheduled to have laser ablation done next week.Pt was sent here for possible biopsy to look for vasculitis. Feel it would be better to wait after laser ablation is completed- the ulcer may heal fully and biopsy may not be necessary 04/29/2015 -- she had the venous ablation done by Dr. Lucky Cowboy last Friday and we do not have any notes yet. She is doing fine otherwise. 05/06/2015 --Review of her recent vascular intervention shows that she was seen by Dr. Lucky Cowboy on 04/29/2015. The follow-up duplex which was done showed that both the great saphenous vein and the small saphenous vein remained patent with reflux consistent with an unsuccessful ablation. He has rescheduled her for  another the endovenous ablation to be done in  about 4 weekso time. 05/13/2015 -- he was seen by her surgeon Dr. Jamal Collin who asked her to continue with conservative therapy and he would speak to Dr. Lucky Cowboy about her management. Dr. Lucky Cowboy is going to schedule her surgery in the middle of August for a repeat endovenous ablation. Her pus culture from last week has grown : [place] her noted her sensitivity report but due to her multiple allergies I had tried clindamycin and she developed a rash with this too. She has been prescribed and anti-buttocks in the ER and is has it at home and she will let is know what she is going to be taking. 05/20/2015 -- she has developed a small spot on her right lower extremity but besides that it is not a full fledged ulceration. She did not get to see Dr. Lucky Cowboy last week and hopefully she will see him in the near future. 05/27/2015 -she is still awaiting her appointment with Dr.Dew and her vascular procedure is not scheduled until August 19. She will be seeing her PCP tomorrow and I have asked her to convey our discussion so that she is aware that debridement has not been done yet. 06/03/2015 -- was seen by her rheumatologist Dr. 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 He is lowering her prednisone to 12-1/2 mg for 1 month and then 10 mg per the next month. I will again make an attempt to speak to her PCP Dr. Candiss Norse and her surgeon Dr. 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 Electronic Signature(s) JAYLEENA, STILLE ([id number]) Signed: 06/03/2015 11:03:08 AM By: [name], [name] Previous Signature: 06/03/2015 10:40:32 AM Version By: [name],  [name] Entered By: [name] on 06/03/2015 11:03:05 QUINLEY, NESLER ([id number]) -------------------------------------------------------------------------------- Physical Exam Details Patient Name: AIYAH, SCARPELLI 06/03/2015 10:15 Date of Service: AM Medical Record [id number] Number: Patient Account Number: [id number] [birth year] (69 y.o. Treating RN: Date of Birth/Sex: Female) Other Clinician: Primary Care Physician: [facility], [name] Treating Nolberto Cheuvront Referring Physician: Glendon Axe Physician/Extender: Weeks in Treatment: 17 Constitutional . Pulse regular. Respirations normal and unlabored. Afebrile. . Eyes Nonicteric. Reactive to light. Ears, Nose, Mouth, and Throat Lips, teeth, and gums WNL.Marland Kitchen Moist mucosa without lesions . Neck supple and nontender. No palpable supraclavicular or cervical adenopathy. Normal sized without goiter. Respiratory WNL. No retractions.. Cardiovascular Pedal Pulses WNL. No clubbing, cyanosis or edema. Lymphatic No adneopathy. No adenopathy. No adenopathy. Musculoskeletal Adexa without tenderness or enlargement.. Digits and nails w/o clubbing, cyanosis, infection, petechiae, ischemia, or inflammatory conditions.. Integumentary (Hair, Skin) No suspicious lesions. No crepitus or fluctuance. No peri-wound warmth or erythema. No masses.Marland Kitchen Psychiatric Judgement and insight Intact.. No evidence of depression, anxiety, or agitation.. Notes The wound continues to be covered with slough and will need sharp debridement with a curette. The Leanor Rubenstein is doing its job and slowly cleaning up this wound. Electronic Signature(s) Signed: 06/03/2015 11:03:59 AM By: [name], [name] Entered By: [name] on 06/03/2015 11:03:57 JOETTE, SCHMOKER ([id number]) -------------------------------------------------------------------------------- Physician Orders Details Patient Name: CAMERON, KATAYAMA D. 06/03/2015 10:15 Date of Service: AM Medical  Record [id number] Number: Patient Account Number: [id number] 10/29/1945 (69 y.o. Treating RN: [name] Date of Birth/Sex: Female) Other Clinician: Primary Care Physician: [facility] [place] Kama Cammarano, Roderick Pee  Referring Physician: Glendon Axe Physician/Extender: Weeks in Treatment: 40 Verbal / Phone Orders: Yes Clinician: [name] Read Back and Verified: Yes Diagnosis Coding Wound Cleansing Wound #2 Left,Distal Lower Leg o Clean wound with Normal Saline. Wound #5 Right,Posterior Lower Leg o Clean wound with Normal Saline. Anesthetic Wound #2 Left,Distal Lower Leg o Topical Lidocaine 4% cream applied to wound bed prior to debridement Wound #5 Right,Posterior Lower Leg o Topical Lidocaine 4% cream applied to wound bed prior to debridement Primary Wound Dressing Wound #2 Left,Distal Lower Leg o Santyl Ointment Wound #5 Right,Posterior Lower Leg o Other: - betadine paint Secondary Dressing Wound #2 Left,Distal Lower Leg o ABD and Kerlix/Conform Wound #5 Right,Posterior Lower Leg o Boardered Foam Dressing Dressing Change Frequency Wound #2 Left,Distal Lower Leg o Change dressing every day. Wound #5 Right,Posterior Lower Leg o Change dressing every day. GRAYCEE, GREESON ([id number]) Follow-up Appointments Wound #2 Left,Distal Lower Leg o Return Appointment in 1 week. Wound #5 Right,Posterior Lower Leg o Return Appointment in 1 week. Edema Control Wound #2 Left,Distal Lower Leg o Support Garment 20-30 mm/Hg pressure to: Wound #5 Right,Posterior Lower Leg o Support Garment 20-30 mm/Hg pressure to: Additional Orders / Instructions Wound #2 Left,Distal Lower Leg o Increase protein intake. Wound #5 Right,Posterior Lower Leg o Increase protein intake. Medications-please add to medication list. Wound #2 Left,Distal Lower Leg o Santyl Enzymatic Ointment Electronic Signature(s) Signed: 06/03/2015 12:16:19 PM By: [name],  [name] Signed: 06/03/2015 5:02:12 PM By: [name] Entered By: [name] on 06/03/2015 10:56:05 Heater, LAURREN LEPKOWSKI ([id number]) -------------------------------------------------------------------------------- Problem List Details Patient Name: LEINANI, LISBON D. 06/03/2015 10:15 Date of Service: AM Medical Record [id number] Number: Patient Account Number: [id number] 1946-05-09 (69 y.o. 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 06/03/2015 10:15 Date of Service: AM Medical Record [id number] Number: Patient Account Number: [id number] 14-Oct-1946 (69 y.o. Treating RN: Date of Birth/Sex: Female) Other Clinician: Primary Care Physician: [facility], [name] Treating [name] Referring Physician: Glendon Axe Physician/Extender: Weeks in Treatment: 17 Subjective Chief Complaint Information obtained from Patient Patient presents to the wound care center for  a consult due non healing wound 69 year old patient comes with a history of having a ulcer on the left lower extremity for the past 4 weeks. she says she's had swelling of both lower extremities for about a year after she started having prednisone. 02/07/2015 -- her vascular appointments obtained were in the first and third week of June. she is able to go to [place] and we will try and get her some earlier appointments. Other than that nothing else has changed in her management. History of Present Illness (HPI) 69 year old patient who is known to have diabetes mellitus type 2, chronic renal insufficiency, coronary artery  disease, hypertension, hypercholesterolemia, temporal arteritis and inflammatory arthritiss also has a history of having a hysterectomy and some orthopedic related surgeries. The ulcer on the left lower extremity started off as a blister and then. Got progressively worse. She does not have any fever or chills and has not had any recent surgical intervention for this. Her last hemoglobin A1c was 10.1 in September 2015. She has been recently put on doxycycline by her PCP. She is now also allergic to doxycycline and was this was changed over to Keflex. 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 She does see a cardiologist and also takes a diuretic. 02/07/2015 her arterial and venous duplex studies to be done have dates been given as the first and third week of June. This is at [facility]. We are going to try and get early appointments at [facility] other than that nothing has changed in her management. 02/14/2015 -- we have been able to get her an appointment in [facility] on May 20 which is much earlier than her previous ones at [place]. She continues with her prednisone and her sugars are in the range of 150-200. 02/21/2015 We were able to get a vascular lab  workup for her today and she is going to be there at 2:00 this afternoon. the swelling of her leg has gone down significantly but she still has some tenderness over the wounds. 02/28/2015 - She has had one of two vascular workups done, and this coming Tuesday has another, at [facility]. She continues to be on steroid medications. She has significant sensitivity in her left lower extremity and has pain suggestive of neuropathic pain and I have asked her to address this with her primary care physician. 03/07/2015 -- The patient saw Dr. Lucky Cowboy for a consultation and he has had her arteries are okay but she has 2 incompetent veins on the left lower extremity and he is going to set her up for surgery. Official report is PARISSA, CHIAO ([id number]) awaited. Addendum: Official reports are now available and on 03/04/2015. She was seen and lower extremity venous duplex exam was done. There was reflux present within the left greater saphenous vein below the knee and also the left small saphenous vein. Arterial duplex showed normal triphasic waveforms throughout the left lower extremity without any significant stenosis. Her ABIs were noncompressible bilaterally but a waveforms were normal and a digital pressures were normal bilaterally consistent with no significant arterial insufficiency. He has recommended endovenous ablation of both the left small saphenous and the left great saphenous vein. This would still be scheduled later. 03/14/2015 -- she has heard back from the vascular office and has surgery scheduled for sometime in July. Her rheumatologist has decreased her prednisone dosage but she still on it. She has also had cataract surgery in her right eye recently this week. 03/20/2015 - No new complaints today. Pain improved. No fever or chills. Tolerating 2 layer compression. 04/14/2015 -- she was doing very well today she went off on vacation and now her edema has  increased markedly the ulceration is bigger and her diabetes is not under control. 04/21/2015 -- I spoke to her PCP Dr. Candiss Norse and discussed the management which would include being seen by a general surgeon for debridement and taking multiple punch biopsies which would help in establishing the diagnosis of this is a vasculitis. She is agreeable  about this and will set her up for the procedure with Dr. Tamala Julian at [facility]. She was seen by the surgeon Dr. Jamal Collin. 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 She is scheduled to have laser ablation done next week.Pt was sent here for possible biopsy to look for vasculitis. Feel it would be better to wait after laser ablation is completed- the ulcer may heal fully and biopsy may not be necessary 04/29/2015 -- she had the venous ablation done by Dr. Lucky Cowboy last Friday and we do not have any notes yet. She is doing fine otherwise. 05/06/2015 --Review of her recent vascular intervention shows that she was seen by Dr. Lucky Cowboy on 04/29/2015. The follow-up duplex which was done showed that both the great saphenous vein and the small saphenous vein remained patent with reflux consistent with an unsuccessful ablation. He has rescheduled her for another the endovenous ablation to be done in about 4 weeks time. 05/13/2015 -- he was seen by her surgeon Dr. Jamal Collin who asked her to continue with conservative therapy and he would speak to Dr. Lucky Cowboy about her management. Dr. Lucky Cowboy is going to schedule her surgery in the middle of August for a repeat endovenous ablation. Her pus culture from last week has grown : [place] her noted her sensitivity report but due to her multiple allergies I had tried clindamycin and she developed a rash with this too. She has  been prescribed and anti-buttocks in the ER and is has it at home and she will let is know what she is going to be taking. 05/20/2015 -- she has developed a small spot on her right lower extremity but besides that it is not a full fledged ulceration. She did not get to see Dr. Lucky Cowboy last week and hopefully she will see him in the near future. 05/27/2015 -she is still awaiting her appointment with Dr.Dew and her vascular procedure is not scheduled until August 19. She will be seeing her PCP tomorrow and I have asked her to convey our discussion so that she is aware that debridement has not been done yet. GRACLYN, LAWTHER ([id number]) 06/03/2015 -- was seen by her rheumatologist Dr. 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 He is lowering her prednisone to 12-1/2 mg for 1 month and then 10 mg per the next month. I will again make an attempt to speak to her PCP Dr. Candiss Norse and her surgeon Dr. 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 Objective Constitutional Pulse regular. Respirations normal and unlabored. Afebrile. Vitals Time Taken: 10:29 AM, Height: 65 in, Weight: 248 lbs, BMI: 41.3, Temperature: 98.1 F, Pulse: 69 bpm, Respiratory Rate: 18 breaths/min, Blood Pressure: 132/56 mmHg. Eyes Nonicteric. Reactive to light. Ears, Nose, Mouth, and Throat Lips, teeth, and gums WNL.Marland Kitchen Moist mucosa without lesions . Neck supple and nontender. No palpable supraclavicular or cervical adenopathy. Normal sized without goiter. Respiratory WNL. No retractions.. Cardiovascular Pedal Pulses WNL. No clubbing, cyanosis or edema. Lymphatic No adneopathy. No adenopathy. No adenopathy. Musculoskeletal Adexa without tenderness or enlargement.. Digits and nails w/o clubbing, cyanosis, infection,  petechiae, ischemia, or inflammatory conditions.Marland Kitchen Psychiatric Judgement and insight Intact.. No evidence of depression, anxiety,  or agitation.. General Notes: The wound continues to be covered with slough and will need sharp debridement with a curette. The Leanor Rubenstein is doing its job and slowly cleaning up this wound. Sylvain, Tiya D. ([id number]) Integumentary (Hair, Skin) No suspicious lesions. No crepitus or fluctuance. No peri-wound warmth or erythema. No masses.. Wound #2 status is Open. Original cause of wound was Blister. The wound is located on the Left,Distal Lower Leg. The wound measures 6.4cm length x 5.3cm width x 0.5cm depth; 26.641cm^2 area and 13.32cm^3 volume. The wound is limited to skin breakdown. There is no tunneling or undermining noted. There is a medium amount of serosanguineous drainage noted. The wound margin is distinct with the outline attached to the wound base. There is small (1-33%) red, pink granulation within the wound bed. There is a large (67-100%) amount of necrotic tissue within the wound bed including Eschar and Adherent Slough. The periwound skin appearance exhibited: Localized Edema, Moist, Hemosiderin Staining. The periwound skin appearance did not exhibit: Callus, Crepitus, Excoriation, Fluctuance, Friable, Induration, Rash, Scarring, Dry/Scaly, Maceration, Atrophie Blanche, Cyanosis, Ecchymosis, Mottled, Pallor, Rubor, Erythema. Periwound temperature was noted as No Abnormality. The periwound has tenderness on palpation. Wound #5 status is Open. Original cause of wound was Gradually Appeared. The wound is located on the Right,Posterior Lower Leg. The wound measures 0.3cm length x 0.4cm width x 0.1cm depth; 0.094cm^2 area and 0.009cm^3 volume. The wound is limited to skin breakdown. There is no tunneling or undermining noted. There is a none present amount of drainage noted. The wound margin is flat and intact. There is no granulation within the wound bed.  There is a large (67-100%) amount of necrotic tissue within the wound bed including Eschar. The periwound skin appearance exhibited: Hemosiderin Staining. The periwound skin appearance did not exhibit: Callus, Crepitus, Excoriation, Fluctuance, Friable, Induration, Localized Edema, Rash, Scarring, Dry/Scaly, Maceration, Moist, Atrophie Blanche, Cyanosis, Ecchymosis, Mottled, Pallor, Rubor, Erythema. 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 All questions answered and she will come back and see me next week. JACQULENE, HUNTLEY ([id number]) Procedures Wound #2 Wound #2 is a Venous Leg Ulcer located on the Left,Distal Lower Leg . There was a Skin/Subcutaneous Tissue Debridement ([id number]) debridement with total area of 24 sq cm performed by Pat Patrick., MD. with the following instrument(s): Curette to remove Viable and Non-Viable tissue/material including Fibrin/Slough, Eschar, and Subcutaneous after achieving pain control using Lidocaine 4% Topical Solution. A time out was conducted prior to the start of the procedure. A Minimum amount of bleeding was controlled with Pressure. The procedure was tolerated well with a pain level of 0 throughout and a pain level of 0 following the procedure. Post Debridement Measurements: 6.4cm length x 5.3cm width x 0.5cm depth; 13.32cm^3 volume. Plan Wound Cleansing: Wound #2 Left,Distal Lower Leg: Clean wound with Normal  Saline. Wound #5 Right,Posterior Lower Leg: Clean wound with Normal Saline. Anesthetic: Wound #2 Left,Distal Lower Leg: Topical Lidocaine 4% cream applied to wound bed prior to debridement Wound #5 Right,Posterior Lower Leg:  Topical Lidocaine 4% cream applied to wound bed prior to debridement Primary Wound Dressing: Wound #2 Left,Distal Lower Leg: Santyl Ointment Wound #5 Right,Posterior Lower Leg: Other: - betadine paint Secondary Dressing: Wound #2 Left,Distal Lower Leg: ABD and Kerlix/Conform Wound #5 Right,Posterior Lower Leg: Boardered Foam Dressing Dressing Change Frequency: Wound #2 Left,Distal Lower Leg: Change dressing every day. Wound #5 Right,Posterior Lower Leg: Change dressing every day. Follow-up Appointments: Wound #2 Left,Distal Lower Leg: Return Appointment in 1 week. Wound #5 Right,Posterior Lower Leg: Return Appointment in 1 week. Edema Control: Prowse, Mccall D. ([id number]) Wound #2 Left,Distal Lower Leg: Support Garment 20-30 mm/Hg pressure to: Wound #5 Right,Posterior Lower Leg: Support Garment 20-30 mm/Hg pressure to: Additional Orders / Instructions: Wound #2 Left,Distal Lower Leg: Increase protein intake. Wound #5 Right,Posterior Lower Leg: Increase protein intake. Medications-please add to medication list.: Wound #2 Left,Distal Lower Leg: Santyl Enzymatic Ointment We will continue using Santyl on the wound and await surgical debridement and biopsies to establish a diagnosis of vasculitis. All questions answered and she will come back and see me next week. Electronic Signature(s) Signed: 06/03/2015 11:05:00 AM By: [name], [name] Entered By: [name] on 06/03/2015 11:05:00 Satcher, Chandria D. ([id number]) -------------------------------------------------------------------------------- SuperBill Details Patient Name: MAHA, FISCHEL D. Date of Service: 06/03/2015 Medical Record Number: [id number] Patient Account Number: [id number] Date of  Birth/Sex: 1946-01-28 (68 y.o. 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 Facility Procedures CPT4: Description Modifier Quantity Code 24818590 11042 - DEB SUBQ TISSUE 20 SQ CM/< 1 ICD-10 Description Diagnosis I83.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 I83.222 Varicose veins of left lower extremity  with both ulcer of calf and inflammation E11.622 Type 2 diabetes mellitus with other skin ulcer CPT4: 93112162 11045 - DEB SUBQ TISS EA ADDL 20CM 1 ICD-10 Description Diagnosis I83.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

## 2015-06-04 NOTE — Progress Notes (Signed)
LATIFA, NOBLE ([id number]) Visit Report for 06/03/2015 Arrival Information Details Patient Name: [name], [name] Date of Service: 06/03/2015 10:15 AM Medical Record Number: [id number] Patient Account Number: [id number] Date of Birth/Sex: Jan 17, 1946 (68 y.o. Female) Treating RN: [name] Primary Care Physician: Glendon Axe Other Clinician: Referring Physician: Glendon Axe Treating Physician/Extender: Frann Rider in Treatment: 56 Visit Information History Since Last Visit Added or deleted any medications: No Patient Arrived: Cane Any new allergies or adverse reactions: No Arrival Time: 10:28 Had a fall or experienced change in No Accompanied By: self activities of daily living that may affect Transfer Assistance: None risk of falls: Patient Identification Verified: Yes Signs or symptoms of abuse/neglect since last No Secondary Verification Process Yes visito Completed: Hospitalized since last visit: No Patient Has Alerts: Yes Pain Present Now: No Patient Alerts: Patient on Blood Thinner Electronic Signature(s) Signed: 06/03/2015 5:02:12 PM By: [name] Entered By: [name] on 06/03/2015 10:28:38 Giannetti, Dalayah D. ([id number]) -------------------------------------------------------------------------------- Encounter Discharge Information Details Patient Name: [name], [name] Date of Service: 06/03/2015 10:15 AM Medical Record Number: [id number] Patient Account Number: [id number] Date of Birth/Sex: [birth year] (68 y.o. Female) Treating RN: [name] Primary Care Physician: Glendon Axe Other Clinician: Referring Physician: Glendon Axe Treating Physician/Extender: Frann Rider in Treatment: 17 Encounter Discharge Information Items Discharge Pain Level: 0 Discharge Condition: Stable Ambulatory Status: Cane Discharge Destination: Home Private Transportation: Auto Accompanied By: self Schedule Follow-up Appointment: Yes Medication  Reconciliation completed and No provided to Patient/Care Tylynn Braniff: Patient Clinical Summary of Care: Declined Electronic Signature(s) Signed: 06/03/2015 11:10:48 AM By: Ruthine Dose Entered By: Ruthine Dose on 06/03/2015 11:10:48 Campione, Julietta D. ([id number]) -------------------------------------------------------------------------------- Lower Extremity Assessment Details Patient Name: Kristin Stare D. Date of Service: 06/03/2015 10:15 AM Medical Record Number: [id number] Patient Account Number: [id number] Date of Birth/Sex: April 02, 1946 (68 y.o. Female) Treating RN: [name] Primary Care Physician: Glendon Axe Other Clinician: Referring Physician: Glendon Axe Treating Physician/Extender: Frann Rider in Treatment: 17 Edema Assessment Assessed: [Left: No] [Right: No] Edema: [Left: Yes] [Right: Yes] Calf Left: Right: Point of Measurement: 33 cm From Medial Instep 37 cm 37.3 cm Ankle Left: Right: Point of Measurement: 12 cm From Medial Instep 23.5 cm 24 cm Vascular Assessment Pulses: Posterior Tibial Dorsalis Pedis Palpable: [Left:Yes] [Right:Yes] Extremity colors, hair growth, and conditions: Extremity Color: [Left:Hyperpigmented] [Right:Hyperpigmented] Hair Growth on Extremity: [Left:No] [Right:No] Temperature of Extremity: [Left:Warm] [Right:Warm] Capillary Refill: [Left:< 3 seconds] [Right:< 3 seconds] Toe Nail Assessment Left: Right: Thick: No No Discolored: No No Deformed: No No Improper Length and Hygiene: No No Electronic Signature(s) Signed: 06/03/2015 5:02:12 PM By: [name] Entered By: [name] on 06/03/2015 10:43:59 Myhand, Kerrington D. ([id number]) -------------------------------------------------------------------------------- Multi Wound Chart Details Patient Name: Kristin Stare D. Date of Service: 06/03/2015 10:15 AM Medical Record Number: [id number] Patient Account Number: [id number] Date of Birth/Sex: [birth year] (68 y.o.  Female) Treating RN: [name] Primary Care Physician: Glendon Axe Other Clinician: Referring Physician: Glendon Axe Treating Physician/Extender: Frann Rider in Treatment: 17 Vital Signs Height(in): 65 Pulse(bpm): 69 Weight(lbs): 248 Blood Pressure 132/56 (mmHg): Body Mass Index(BMI): 41 Temperature(F): 98.1 Respiratory Rate 18 (breaths/min): Photos: [2:No Photos] [5:No Photos] [N/A:N/A] Wound Location: [2:Left Lower Leg - Distal Right Lower Leg -] [5:Posterior] [N/A:N/A] Wounding Event: [2:Blister] [5:Gradually Appeared] [N/A:N/A] Primary Etiology: [2:Venous Leg Ulcer] [5:Venous Leg Ulcer] [N/A:N/A] Comorbid History: [2:Cataracts, Asthma, Coronary Artery Disease, Coronary Artery Disease, Hypertension, Type II Diabetes, Osteoarthritis, Diabetes, Osteoarthritis, Neuropathy] [5:Cataracts, Asthma, Hypertension, Type II Neuropathy] [N/A:N/A] Date Acquired: [2:12/30/2014] [5:04/29/2015] [N/A:N/A] Weeks  of Treatment: [2:17] [5:2] [N/A:N/A] Wound Status: [2:Open] [5:Open] [N/A:N/A] Clustered Wound: [2:Yes] [5:No] [N/A:N/A] Measurements L x W x D 6.4x5.3x0.5 [5:0.3x0.4x0.1] [N/A:N/A] (cm) Area (cm) : [2:26.641] [5:0.094] [N/A:N/A] Volume (cm) : [2:13.32] [5:0.009] [N/A:N/A] % Reduction in Area: [2:-3668.20%] [5:50.00%] [N/A:N/A] % Reduction in Volume: -18660.60% [5:52.60%] [N/A:N/A] Classification: [2:Full Thickness Without Exposed Support Structures] [5:Partial Thickness] [N/A:N/A] HBO Classification: [2:Grade 1] [5:Grade 1] [N/A:N/A] Exudate Amount: [2:Medium] [5:None Present] [N/A:N/A] Exudate Type: [2:Serosanguineous] [5:N/A] [N/A:N/A] Exudate Color: [2:red, brown] [5:N/A] [N/A:N/A] Foul Odor After [2:Yes] [5:N/A] [N/A:N/A] Cleansing: [2:No] [5:N/A] [N/A:N/A] Odor Anticipated Due to Product Use: Wound Margin: Distinct, outline attached Flat and Intact N/A Granulation Amount: Small (1-33%) None Present (0%) N/A Granulation Quality: Red, Pink N/A  N/A Necrotic Amount: Large (67-100%) Large (67-100%) N/A Necrotic Tissue: Eschar, Adherent Slough Eschar N/A Exposed Structures: Fascia: No Fascia: No N/A Fat: No Fat: No Tendon: No Tendon: No Muscle: No Muscle: No Joint: No Joint: No Bone: No Bone: No Limited to Skin Limited to Skin Breakdown Breakdown Epithelialization: None None N/A Periwound Skin Texture: Edema: Yes Edema: No N/A Excoriation: No Excoriation: No Induration: No Induration: No Callus: No Callus: No Crepitus: No Crepitus: No Fluctuance: No Fluctuance: No Friable: No Friable: No Rash: No Rash: No Scarring: No Scarring: No Periwound Skin Moist: Yes Maceration: No N/A Moisture: Maceration: No Moist: No Dry/Scaly: No Dry/Scaly: No Periwound Skin Color: Hemosiderin Staining: Yes Hemosiderin Staining: Yes N/A Atrophie Blanche: No Atrophie Blanche: No Cyanosis: No Cyanosis: No Ecchymosis: No Ecchymosis: No Erythema: No Erythema: No Mottled: No Mottled: No Pallor: No Pallor: No Rubor: No Rubor: No Temperature: No Abnormality N/A N/A Tenderness on Yes No N/A Palpation: Wound Preparation: Ulcer Cleansing: Ulcer Cleansing: N/A Rinsed/Irrigated with Rinsed/Irrigated with Saline Saline Topical Anesthetic Topical Anesthetic Applied: Other: lidocaine Applied: None 4% Treatment Notes SANNA, PORCARO ([id number]) Electronic Signature(s) Signed: 06/03/2015 5:02:12 PM By: [name] Entered By: [name] on 06/03/2015 10:44:17 [name], [name] Kitchen ([id number]) -------------------------------------------------------------------------------- Multi-Disciplinary Care Plan Details Patient Name: [name], [name] Date of Service: 06/03/2015 10:15 AM Medical Record Number: [id number] Patient Account Number: [id number] Date of Birth/Sex: 1946/07/28 (68 y.o. Female) Treating RN: [name] Primary Care Physician: Glendon Axe Other Clinician: Referring Physician: Glendon Axe Treating  Physician/Extender: Frann Rider in Treatment: 2 Active Inactive Orientation to the Wound Care Program Nursing Diagnoses: Knowledge deficit related to the wound healing center program Goals: Patient/caregiver will verbalize understanding of the [facility] Date Initiated: 01/31/2015 Goal Status: Active Interventions: Provide education on orientation to the wound center Notes: Venous Leg Ulcer Nursing Diagnoses: Potential for venous Insuffiency (use before diagnosis confirmed) Goals: Non-invasive venous studies are completed as ordered Date Initiated: 01/31/2015 Goal Status: Active Patient/caregiver will verbalize understanding of disease process and disease management Date Initiated: 01/31/2015 Goal Status: Active Interventions: Assess peripheral edema status every visit. Notes: Wound/Skin Impairment Nursing Diagnoses: Impaired tissue integrity Knowledge deficit related to smoking impact on wound healing Farkas, Betania D. ([id number]) Goals: Patient/caregiver will verbalize understanding of skin care regimen Date Initiated: 01/31/2015 Goal Status: Active Ulcer/skin breakdown will heal within 14 weeks Date Initiated: 01/31/2015 Goal Status: Active Interventions: Assess ulceration(s) every visit Notes: Electronic Signature(s) Signed: 06/03/2015 5:02:12 PM By: [name] Entered By: [name] on 06/03/2015 10:44:09 [name], [name] ([id number]) -------------------------------------------------------------------------------- Patient/Caregiver Education Details Patient Name: Kristin Stare D. Date of Service: 06/03/2015 10:15 AM Medical Record Number: [id number] Patient Account Number: [id number] Date of Birth/Gender: Oct 21, 1946 (68 y.o. Female) Treating RN: [name] Primary Care Physician: Glendon Axe Other  Clinician: Referring Physician: Glendon Axe Treating Physician/Extender: Frann Rider in Treatment: 17 Education  Assessment Education Provided To: Patient Education Topics Provided Wound/Skin Impairment: Handouts: Other: wound care and compression as ordered Methods: Demonstration, Explain/Verbal Responses: State content correctly Electronic Signature(s) Signed: 06/03/2015 10:46:47 AM By: [name] Entered By: [name] on 06/03/2015 10:46:47 [name], [name] ([id number]) -------------------------------------------------------------------------------- Wound Assessment Details Patient Name: [name], [name] Date of Service: 06/03/2015 10:15 AM Medical Record Number: [id number] Patient Account Number: [id number] Date of Birth/Sex: 1946-10-05 (68 y.o. Female) Treating RN: [name] Primary Care Physician: Glendon Axe Other Clinician: Referring Physician: Glendon Axe Treating Physician/Extender: Frann Rider in Treatment: 17 Wound Status Wound Number: 2 Primary Venous Leg Ulcer Etiology: Wound Location: Left Lower Leg - Distal Wound Open Wounding Event: Blister Status: Date Acquired: 12/30/2014 Comorbid Cataracts, Asthma, Coronary Artery Weeks Of Treatment: 17 History: Disease, Hypertension, Type II Clustered Wound: Yes Diabetes, Osteoarthritis, Neuropathy Photos Photo Uploaded By: [name] on 06/03/2015 16:33:05 Wound Measurements Length: (cm) 6.4 Width: (cm) 5.3 Depth: (cm) 0.5 Area: (cm) 26.641 Volume: (cm) 13.32 % Reduction in Area: -3668.2% % Reduction in Volume: -18660.6% Epithelialization: None Tunneling: No Undermining: No Wound Description Full Thickness Without Exposed Foul Odor A Classification: Support Structures Due to Prod Diabetic Severity Grade 1 (Wagner): Wound Margin: Distinct, outline attached Exudate Amount: Medium Exudate Type: Serosanguineous Exudate Color: red, brown fter Cleansing: Yes uct Use: No Wound Bed Granulation Amount: Small (1-33%) Exposed Structure [name], [name] ([id number]) Granulation Quality: Red,  Pink Fascia Exposed: No Necrotic Amount: Large (67-100%) Fat Layer Exposed: No Necrotic Quality: Eschar, Adherent Slough Tendon Exposed: No Muscle Exposed: No Joint Exposed: No Bone Exposed: No Limited to Skin Breakdown Periwound Skin Texture Texture Color No Abnormalities Noted: No No Abnormalities Noted: No Callus: No Atrophie Blanche: No Crepitus: No Cyanosis: No Excoriation: No Ecchymosis: No Fluctuance: No Erythema: No Friable: No Hemosiderin Staining: Yes Induration: No Mottled: No Localized Edema: Yes Pallor: No Rash: No Rubor: No Scarring: No Temperature / Pain Moisture Temperature: No Abnormality No Abnormalities Noted: No Tenderness on Palpation: Yes Dry / Scaly: No Maceration: No Moist: Yes Wound Preparation Ulcer Cleansing: Rinsed/Irrigated with Saline Topical Anesthetic Applied: Other: lidocaine 4%, Treatment Notes Wound #2 (Left, Distal Lower Leg) 1. Cleansed with: Clean wound with Normal Saline 2. Anesthetic Topical Lidocaine 4% cream to wound bed prior to debridement 3. Peri-wound Care: Skin Prep 4. Dressing Applied: Santyl Ointment 5. Secondary Dressing Applied Dry Gauze Kerlix/Conform 7. Secured with [employer]) [place], PAONE ([id number]) Signed: 06/03/2015 5:02:12 PM By: [name] Entered By: [name] on 06/03/2015 10:40:50 [name], [name] ([id number]) -------------------------------------------------------------------------------- Wound Assessment Details Patient Name: [name], [name] Date of Service: 06/03/2015 10:15 AM Medical Record Number: [id number] Patient Account Number: [id number] Date of Birth/Sex: 08-14-1946 (68 y.o. Female) Treating RN: [name] Primary Care Physician: Glendon Axe Other Clinician: Referring Physician: Glendon Axe Treating Physician/Extender: Frann Rider in Treatment: 17 Wound Status Wound Number: 5 Primary Venous Leg Ulcer Etiology: Wound Location:  Right Lower Leg - Posterior Wound Open Wounding Event: Gradually Appeared Status: Date Acquired: 04/29/2015 Comorbid Cataracts, Asthma, Coronary Artery Weeks Of Treatment: 2 History: Disease, Hypertension, Type II Clustered Wound: No Diabetes, Osteoarthritis, Neuropathy Photos Photo Uploaded By: [name] on 06/03/2015 16:33:06 Wound Measurements Length: (cm) 0.3 Width: (cm) 0.4 Depth: (cm) 0.1 Area: (cm) 0.094 Volume: (cm) 0.009 % Reduction in Area: 50% % Reduction in Volume: 52.6% Epithelialization: None Tunneling: No Undermining: No Wound Description Classification: Partial Thickness Diabetic Severity (  Wagner): Grade 1 Wound Margin: Flat and Intact Exudate Amount: None Present Wound Bed Granulation Amount: None Present (0%) Exposed Structure Necrotic Amount: Large (67-100%) Fascia Exposed: No Necrotic Quality: Eschar Fat Layer Exposed: No Tendon Exposed: No Muscle Exposed: No [name], [name] ([id number]) Joint Exposed: No Bone Exposed: No Limited to Skin Breakdown Periwound Skin Texture Texture Color No Abnormalities Noted: No No Abnormalities Noted: No Callus: No Atrophie Blanche: No Crepitus: No Cyanosis: No Excoriation: No Ecchymosis: No Fluctuance: No Erythema: No Friable: No Hemosiderin Staining: Yes Induration: No Mottled: No Localized Edema: No Pallor: No Rash: No Rubor: No Scarring: No Moisture No Abnormalities Noted: No Dry / Scaly: No Maceration: No Moist: No Wound Preparation Ulcer Cleansing: Rinsed/Irrigated with Saline Topical Anesthetic Applied: None Treatment Notes Wound #5 (Right, Posterior Lower Leg) 1. Cleansed with: Clean wound with Normal Saline 4. Dressing Applied: Other dressing (specify in notes) 5. Secondary Dressing Applied Bordered Foam Dressing Notes betadine paint Electronic Signature(s) Signed: 06/03/2015 5:02:12 PM By: [name] Entered By: [name] on 06/03/2015 10:41:08 [name], Rya D.  ([id number]) -------------------------------------------------------------------------------- Vitals Details Patient Name: TYNIESHA, HOWALD D. Date of Service: 06/03/2015 10:15 AM Medical Record Number: [id number] Patient Account Number: [id number] Date of Birth/Sex: 1946-04-24 (68 y.o. Female) Treating RN: [name] Primary Care Physician: Glendon Axe Other Clinician: Referring Physician: Glendon Axe Treating Physician/Extender: Frann Rider in Treatment: 17 Vital Signs Time Taken: 10:29 Temperature (F): 98.1 Height (in): 65 Pulse (bpm): 69 Weight (lbs): 248 Respiratory Rate (breaths/min): 18 Body Mass Index (BMI): 41.3 Blood Pressure (mmHg): 132/56 Reference Range: 80 - 120 mg / dl Electronic Signature(s) Signed: 06/03/2015 5:02:12 PM By: [name] Entered By: [name] on 06/03/2015 10:31:36

## 2015-06-10 ENCOUNTER — Encounter: Payer: PPO | Admitting: Surgery

## 2015-06-10 DIAGNOSIS — L97322 Non-pressure chronic ulcer of left ankle with fat layer exposed: Secondary | ICD-10-CM | POA: Diagnosis not present

## 2015-06-10 NOTE — Progress Notes (Signed)
[name], [name] ([id number]) Visit Report for 06/10/2015 Arrival Information Details Patient Name: [name], [name] Date of Service: 06/10/2015 11:45 AM Medical Record Number: [id number] Patient Account Number: [id number] Date of Birth/Sex: 08-21-1946 (68 y.o. Female) Treating RN: [name], RN, [name], [name] Primary Care Physician: Glendon Axe Other Clinician: Referring Physician: Glendon Axe Treating Physician/Extender: Frann Rider in Treatment: 18 Visit Information History Since Last Visit Added or deleted any medications: No Patient Arrived: Cane Any new allergies or adverse reactions: No Arrival Time: 11:48 Had a fall or experienced change in No Accompanied By: self activities of daily living that may affect Transfer Assistance: None risk of falls: Patient Identification Verified: Yes Signs or symptoms of abuse/neglect since last No Secondary Verification Process Yes visito Completed: Hospitalized since last visit: No Patient Has Alerts: Yes Has Dressing in Place as Prescribed: Yes Patient Alerts: Patient on Blood Pain Present Now: No Thinner Electronic Signature(s) Signed: 06/10/2015 11:50:15 AM By: Regan Lemming [name], RN Entered By: Regan Lemming on 06/10/2015 11:50:15 Trinkle, [name] -------------------------------------------------------------------------------- Encounter Discharge Information Details Patient Name: [name] Date of Service: 06/10/2015 11:45 AM Medical Record Number: [id number] Patient Account Number: [id number] Date of Birth/Sex: 1946/06/21 (68 y.o. Female) Treating RN: Baruch Gouty, RN, [name], [name] Primary Care Physician: Glendon Axe Other Clinician: Referring Physician: Glendon Axe Treating Physician/Extender: Frann Rider in Treatment: 72 Encounter Discharge Information Items Discharge Pain Level: 0 Discharge Condition: Stable Ambulatory Status: Cane Discharge Destination: Home Transportation: Private  Auto Accompanied By: Carlynn Purl Schedule Follow-up Appointment: No Medication Reconciliation completed No and provided to Patient/Care Sharisa Toves: Provided on Clinical Summary of Care: 06/10/2015 Form Type Recipient Paper Patient LM Electronic Signature(s) Signed: 06/10/2015 12:17:13 PM By: Ruthine Dose Previous Signature: 06/10/2015 12:15:21 PM Version By: Regan Lemming [name], RN Entered By: Ruthine Dose on 06/10/2015 12:17:13 [name], [name] ([id number]) -------------------------------------------------------------------------------- Lower Extremity Assessment Details Patient Name: [name], [name] Date of Service: 06/10/2015 11:45 AM Medical Record Number: [id number] Patient Account Number: [id number] Date of Birth/Sex: 17-Dec-1945 (68 y.o. Female) Treating RN: [name], RN, [name], [name] Primary Care Physician: Glendon Axe Other Clinician: Referring Physician: Glendon Axe Treating Physician/Extender: Frann Rider in Treatment: 18 Edema Assessment Assessed: [Left: No] [Right: No] E[Left: dema] [Right: :] Calf Left: Right: Point of Measurement: 33 cm From Medial Instep 37 cm 37.3 cm Ankle Left: Right: Point of Measurement: 12 cm From Medial Instep 23.5 cm 24 cm Vascular Assessment Claudication: Claudication Assessment [Left:None] [Right:None] Pulses: Posterior Tibial Dorsalis Pedis Palpable: [Left:Yes] [Right:Yes] Extremity colors, hair growth, and conditions: Extremity Color: [Left:Mottled] [Right:Normal] Hair Growth on Extremity: [Left:No] [Right:No] Temperature of Extremity: [Left:Warm] [Right:Warm] Capillary Refill: [Left:< 3 seconds] [Right:< 3 seconds] Electronic Signature(s) Signed: 06/10/2015 1:13:31 PM By: Regan Lemming [name], RN Entered By: Regan Lemming on 06/10/2015 13:13:31 Espinal, [name] ([id number]) -------------------------------------------------------------------------------- Multi Wound Chart Details Patient Name: [name] Date of Service: 06/10/2015  11:45 AM Medical Record Number: [id number] Patient Account Number: [id number] Date of Birth/Sex: 1946-10-21 (68 y.o. Female) Treating RN: Baruch Gouty, RN, [name], [name] Primary Care Physician: Glendon Axe Other Clinician: Referring Physician: Glendon Axe Treating Physician/Extender: Frann Rider in Treatment: 18 Vital Signs Height(in): 65 Pulse(bpm): 70 Weight(lbs): 248 Blood Pressure 139/57 (mmHg): Body Mass Index(BMI): 41 Temperature(F): 97.8 Respiratory Rate 17 (breaths/min): Photos: [2:No Photos] [5:No Photos] [N/A:N/A] Wound Location: [2:Left Lower Leg - Distal Right Lower Leg -] [5:Posterior] [N/A:N/A] Wounding Event: [2:Blister] [5:Gradually Appeared] [N/A:N/A] Primary Etiology: [2:Venous Leg Ulcer] [5:Venous Leg Ulcer] [N/A:N/A] Comorbid History: [2:Cataracts, Asthma, Coronary Artery Disease, Coronary Artery Disease,  Hypertension, Type II Diabetes, Osteoarthritis, Diabetes, Osteoarthritis, Neuropathy] [5:Cataracts, Asthma, Hypertension, Type II Neuropathy] [N/A:N/A] Date Acquired: [2:12/30/2014] [5:04/29/2015] [N/A:N/A] Weeks of Treatment: [2:18] [5:3] [N/A:N/A] Wound Status: [2:Open] [5:Open] [N/A:N/A] Clustered Wound: [2:Yes] [5:No] [N/A:N/A] Measurements L x W x [name] 6.4x5.4x0.5 [5:0.1x0.1x0.1] [N/A:N/A] (cm) Area (cm) : [2:27.143] [5:0.008] [N/A:N/A] Volume (cm) : [2:13.572] [5:0.001] [N/A:N/A] % Reduction in Area: [2:-3739.20%] [5:95.70%] [N/A:N/A] % Reduction in Volume: -19015.50% [5:94.70%] [N/A:N/A] Classification: [2:Full Thickness Without Exposed Support Structures] [5:Partial Thickness] [N/A:N/A] HBO Classification: [2:Grade 1] [5:Grade 1] [N/A:N/A] Exudate Amount: [2:Medium] [5:None Present] [N/A:N/A] Exudate Type: [2:Serosanguineous] [5:N/A] [N/A:N/A] Exudate Color: [2:red, brown] [5:N/A] [N/A:N/A] Foul Odor After [2:Yes] [5:N/A] [N/A:N/A] Cleansing: [2:No] [5:N/A] [N/A:N/A] Odor Anticipated Due to Product Use: Wound Margin: Distinct, outline attached  Flat and Intact N/A Granulation Amount: Small (1-33%) None Present (0%) N/A Granulation Quality: Red, Pink N/A N/A Necrotic Amount: Large (67-100%) Large (67-100%) N/A Necrotic Tissue: Eschar, Adherent Slough Eschar N/A Exposed Structures: Fascia: No Fascia: No N/A Fat: No Fat: No Tendon: No Tendon: No Muscle: No Muscle: No Joint: No Joint: No Bone: No Bone: No Limited to Skin Limited to Skin Breakdown Breakdown Epithelialization: None None N/A Debridement: Debridement ([id number]- N/A N/A 11047) Time-Out Taken: Yes N/A N/A Pain Control: Lidocaine 4% Topical N/A N/A Solution Tissue Debrided: Fibrin/Slough, Exudates, N/A N/A Subcutaneous Level: Skin/Subcutaneous N/A N/A Tissue Debridement Area (sq 34.56 N/A N/A cm): Instrument: Curette N/A N/A Bleeding: Minimum N/A N/A Hemostasis Achieved: Pressure N/A N/A Procedural Pain: 0 N/A N/A Post Procedural Pain: 0 N/A N/A Debridement Treatment Procedure was tolerated N/A N/A Response: well Post Debridement 6.4x5.4x0.5 N/A N/A Measurements L x W x [name] (cm) Post Debridement 13.572 N/A N/A Volume: (cm) Periwound Skin Texture: Edema: Yes Edema: Yes N/A Excoriation: No Excoriation: No Induration: No Induration: No Callus: No Callus: No Crepitus: No Crepitus: No Fluctuance: No Fluctuance: No Friable: No Friable: No Rash: No Rash: No Scarring: No Scarring: No Periwound Skin N/A Moisture: [name], [name] ([id number]) Moist: Yes Dry/Scaly: Yes Maceration: No Maceration: No Dry/Scaly: No Moist: No Periwound Skin Color: Hemosiderin Staining: Yes Hemosiderin Staining: Yes N/A Atrophie Blanche: No Atrophie Blanche: No Cyanosis: No Cyanosis: No Ecchymosis: No Ecchymosis: No Erythema: No Erythema: No Mottled: No Mottled: No Pallor: No Pallor: No Rubor: No Rubor: No Temperature: No Abnormality No Abnormality N/A Tenderness on Yes Yes N/A Palpation: Wound Preparation: Ulcer Cleansing: Ulcer Cleansing:  N/A Rinsed/Irrigated with Rinsed/Irrigated with Saline Saline Topical Anesthetic Topical Anesthetic Applied: Other: lidocaine Applied: Other 4% Procedures Performed: Debridement N/A N/A Treatment Notes Electronic Signature(s) Signed: 06/10/2015 12:04:42 PM By: Regan Lemming [name], RN Entered By: Regan Lemming on 06/10/2015 12:04:42 Tensley, Tia Masker ([id number]) -------------------------------------------------------------------------------- Multi-Disciplinary Care Plan Details Patient Name: CHEY, RACHELS [name] Date of Service: 06/10/2015 11:45 AM Medical Record Number: [id number] Patient Account Number: [id number] Date of Birth/Sex: 11/26/1945 (68 y.o. Female) Treating RN: [name], RN, [name], [name] Primary Care Physician: Glendon Axe Other Clinician: Referring Physician: Glendon Axe Treating Physician/Extender: Frann Rider in Treatment: 104 Active Inactive Orientation to the Wound Care Program Nursing Diagnoses: Knowledge deficit related to the wound healing center program Goals: Patient/caregiver will verbalize understanding of the [place] Program Date Initiated: 01/31/2015 Goal Status: Active Interventions: Provide education on orientation to the wound center Notes: Venous Leg Ulcer Nursing Diagnoses: Potential for venous Insuffiency (use before diagnosis confirmed) Goals: Non-invasive venous studies are completed as ordered Date Initiated: 01/31/2015 Goal Status: Active Patient/caregiver will verbalize understanding of disease process and disease management Date Initiated: 01/31/2015 Goal Status: Active Interventions:  Assess peripheral edema status every visit. Notes: Wound/Skin Impairment Nursing Diagnoses: Impaired tissue integrity Knowledge deficit related to smoking impact on wound healing [name], [name] ([id number]) Goals: Patient/caregiver will verbalize understanding of skin care regimen Date Initiated: 01/31/2015 Goal Status: Active Ulcer/skin  breakdown will heal within 14 weeks Date Initiated: 01/31/2015 Goal Status: Active Interventions: Assess ulceration(s) every visit Notes: Electronic Signature(s) Signed: 06/10/2015 12:04:35 PM By: Regan Lemming [name], RN Entered By: Regan Lemming on 06/10/2015 12:04:35 [name], [name] ([id number]) -------------------------------------------------------------------------------- Pain Assessment Details Patient Name: [name] Date of Service: 06/10/2015 11:45 AM Medical Record Number: [id number] Patient Account Number: [id number] Date of Birth/Sex: [birth year] (68 y.o. Female) Treating RN: Baruch Gouty, RN, [name], [name] Primary Care Physician: Glendon Axe Other Clinician: Referring Physician: Glendon Axe Treating Physician/Extender: Frann Rider in Treatment: 18 Active Problems Location of Pain Severity and Description of Pain Patient Has Paino No Site Locations Pain Management and Medication Current Pain Management: Electronic Signature(s) Signed: 06/10/2015 11:50:24 AM By: Regan Lemming [name], RN Entered By: Regan Lemming on 06/10/2015 11:50:24 Dickison, Seleny DMarland Kitchen ([id number]) -------------------------------------------------------------------------------- Patient/Caregiver Education Details Patient Name: CORRIE, REDER [name] Date of Service: 06/10/2015 11:45 AM Medical Record Number: [id number] Patient Account Number: [id number] Date of Birth/Gender: 01/16/1946 (68 y.o. Female) Treating RN: Baruch Gouty, RN, [name], [name] Primary Care Physician: Glendon Axe Other Clinician: Referring Physician: Glendon Axe Treating Physician/Extender: Frann Rider in Treatment: 18 Education Assessment Education Provided To: Patient Education Topics Provided Basic Hygiene: Methods: Explain/Verbal Responses: State content correctly Welcome To [facility] [place]: Methods: Explain/Verbal Responses: State content correctly Electronic Signature(s) Signed: 06/10/2015 12:15:34 PM By: Regan Lemming [name],  RN Entered By: Regan Lemming on 06/10/2015 12:15:34 [name], [name] ([id number]) -------------------------------------------------------------------------------- Wound Assessment Details Patient Name: [name] Date of Service: 06/10/2015 11:45 AM Medical Record Number: [id number] Patient Account Number: [id number] Date of Birth/Sex: 1946/07/27 (68 y.o. Female) Treating RN: [name], RN, [name], [place] Primary Care Physician: Glendon Axe Other Clinician: Referring Physician: Glendon Axe Treating Physician/Extender: Frann Rider in Treatment: 18 Wound Status Wound Number: 2 Primary Venous Leg Ulcer Etiology: Wound Location: Left Lower Leg - Distal Wound Open Wounding Event: Blister Status: Date Acquired: 12/30/2014 Comorbid Cataracts, Asthma, Coronary Artery Weeks Of Treatment: 18 History: Disease, Hypertension, Type II Clustered Wound: Yes Diabetes, Osteoarthritis, Neuropathy Photos Photo Uploaded By: Regan Lemming on 06/10/2015 16:05:00 Wound Measurements Length: (cm) 6.4 Width: (cm) 5.4 Depth: (cm) 0.5 Area: (cm) 27.143 Volume: (cm) 13.572 % Reduction in Area: -3739.2% % Reduction in Volume: -19015.5% Epithelialization: None Tunneling: No Undermining: No Wound Description Full Thickness Without Exposed Foul Odor A Classification: Support Structures Due to Prod Diabetic Severity Grade 1 ([place]agner): Wound Margin: Distinct, outline attached Exudate Amount: Medium Exudate Type: Serosanguineous Exudate Color: red, brown fter Cleansing: Yes uct Use: No Wound Bed Granulation Amount: Small (1-33%) Exposed Structure [name], [name] ([id number]) Granulation Quality: Red, Pink Fascia Exposed: No Necrotic Amount: Large (67-100%) Fat Layer Exposed: No Necrotic Quality: Eschar, Adherent Slough Tendon Exposed: No Muscle Exposed: No Joint Exposed: No Bone Exposed: No Limited to Skin Breakdown Periwound Skin Texture Texture Color No Abnormalities Noted: No No  Abnormalities Noted: No Callus: No Atrophie Blanche: No Crepitus: No Cyanosis: No Excoriation: No Ecchymosis: No Fluctuance: No Erythema: No Friable: No Hemosiderin Staining: Yes Induration: No Mottled: No Localized Edema: Yes Pallor: No Rash: No Rubor: No Scarring: No Temperature / Pain Moisture Temperature: No Abnormality No Abnormalities Noted: No Tenderness on Palpation: Yes Dry / Scaly: No Maceration: No Moist: Yes Wound Preparation Ulcer  Cleansing: Rinsed/Irrigated with Saline Topical Anesthetic Applied: Other: lidocaine 4%, Treatment Notes Wound #2 (Left, Distal Lower Leg) 1. Cleansed with: Clean wound with Normal Saline 2. Anesthetic Topical Lidocaine 4% cream to wound bed prior to debridement 3. Peri-wound Care: Skin Prep 4. Dressing Applied: Santyl Ointment 5. Secondary Dressing Applied Dry Gauze Kerlix/Conform 7. Secured with Tape Notes [name], [name] ([id number]) betadine paint to the right posterior Electronic Signature(s) Signed: 06/10/2015 12:00:27 PM By: Regan Lemming [name], RN Entered By: Regan Lemming on 06/10/2015 12:00:27 [name], [name] ([id number]) -------------------------------------------------------------------------------- Wound Assessment Details Patient Name: [name], [name] Date of Service: 06/10/2015 11:45 AM Medical Record Number: [id number] Patient Account Number: [id number] Date of Birth/Sex: 04/18/1946 (68 y.o. Female) Treating RN: [name], RN, [name], [place] Primary Care Physician: Glendon Axe Other Clinician: Referring Physician: Glendon Axe Treating Physician/Extender: Frann Rider in Treatment: 18 Wound Status Wound Number: 5 Primary Venous Leg Ulcer Etiology: Wound Location: Right Lower Leg - Posterior Wound Open Wounding Event: Gradually Appeared Status: Date Acquired: 04/29/2015 Comorbid Cataracts, Asthma, Coronary Artery Weeks Of Treatment: 3 History: Disease, Hypertension, Type II Clustered Wound:  No Diabetes, Osteoarthritis, Neuropathy Photos Photo Uploaded By: Regan Lemming on 06/10/2015 16:05:01 Wound Measurements Length: (cm) 0.1 Width: (cm) 0.1 Depth: (cm) 0.1 Area: (cm) 0.008 Volume: (cm) 0.001 % Reduction in Area: 95.7% % Reduction in Volume: 94.7% Epithelialization: None Tunneling: No Undermining: No Wound Description Classification: Partial Thickness Diabetic Severity Earleen [place]): Grade 1 Wound Margin: Flat and Intact Exudate Amount: None Present Wound Bed Granulation Amount: None Present (0%) Exposed Structure Necrotic Amount: Large (67-100%) Fascia Exposed: No Necrotic Quality: Eschar Fat Layer Exposed: No Tendon Exposed: No Muscle Exposed: No [name], [name] ([id number]) Joint Exposed: No Bone Exposed: No Limited to Skin Breakdown Periwound Skin Texture Texture Color No Abnormalities Noted: No No Abnormalities Noted: No Callus: No Atrophie Blanche: No Crepitus: No Cyanosis: No Excoriation: No Ecchymosis: No Fluctuance: No Erythema: No Friable: No Hemosiderin Staining: Yes Induration: No Mottled: No Localized Edema: Yes Pallor: No Rash: No Rubor: No Scarring: No Temperature / Pain Moisture Temperature: No Abnormality No Abnormalities Noted: No Tenderness on Palpation: Yes Dry / Scaly: Yes Maceration: No Moist: No Wound Preparation Ulcer Cleansing: Rinsed/Irrigated with Saline Topical Anesthetic Applied: Other Treatment Notes Wound #5 (Right, Posterior Lower Leg) 1. Cleansed with: Clean wound with Normal Saline 2. Anesthetic Topical Lidocaine 4% cream to wound bed prior to debridement 3. Peri-wound Care: Skin Prep 4. Dressing Applied: Santyl Ointment 5. Secondary Dressing Applied Dry Gauze Kerlix/Conform 7. Secured with Tape Notes betadine paint to the right posterior Electronic Signature(s) Signed: 06/10/2015 12:03:20 PM By: Regan Lemming [name], RN [name], [name] ([id number]) Entered By: Regan Lemming on 06/10/2015  12:03:20 Kellenberger, Lari [name] ([id number]) -------------------------------------------------------------------------------- Vitals Details Patient Name: [name] Date of Service: 06/10/2015 11:45 AM Medical Record Number: [id number] Patient Account Number: [id number] Date of Birth/Sex: [birth year] (68 y.o. Female) Treating RN: [name], RN, [name], [place] Primary Care Physician: Glendon Axe Other Clinician: Referring Physician: Glendon Axe Treating Physician/Extender: Frann Rider in Treatment: 18 Vital Signs Time Taken: 11:50 Temperature (F): 97.8 Height (in): 65 Pulse (bpm): 70 Weight (lbs): 248 Respiratory Rate (breaths/min): 17 Body Mass Index (BMI): 41.3 Blood Pressure (mmHg): 139/57 Reference Range: 80 - 120 mg / dl Electronic Signature(s) Signed: 06/10/2015 11:50:51 AM By: Regan Lemming [name], RN Entered By: Regan Lemming on 06/10/2015 11:50:51

## 2015-06-11 NOTE — Progress Notes (Signed)
IVIANA, BLASINGAME ([id number]) Visit Report for 06/10/2015 Chief Complaint Document Details Patient Name: [name], [name] 06/10/2015 11:45 Date of Service: AM Medical Record [id number] Number: Patient Account Number: [id number] Nov 23, 1945 (69 y.o. [name], RN, [name], Date of Birth/Sex: Treating RN: Female) Administrator, sports Primary Care Physician: Glendon Axe Other Clinician: Referring Physician: Glendon Axe Treating Jerimy Johanson Physician/Extender: Weeks in Treatment: 18 Information Obtained from: Patient Chief Complaint Patient presents to the wound care center for a consult due non healing wound 69 year old patient comes with a history of having a ulcer on the left lower extremity for the past 4 weeks. she says she's had swelling of both lower extremities for about a year after she started having prednisone. 02/07/2015 -- her vascular appointments obtained were in the first and third week of June. she is able to go to [place] and we will try and get her some earlier appointments. Other than that nothing else has changed in her management. Electronic Signature(s) Signed: 06/10/2015 12:18:58 PM By: [name], [name] Entered By: [name] on 06/10/2015 12:18:58 MASHONDA, BROSKI ([id number]) -------------------------------------------------------------------------------- Debridement Details Patient Name: HAELYN, FORGEY D. 06/10/2015 11:45 Date of Service: AM Medical Record [id number] Number: Patient Account Number: [id number] Apr 30, 1946 (69 y.o. [name], RN, [name], Date of Birth/Sex: Treating RN: Female) Administrator, sports Primary Care Physician: Glendon Axe Other Clinician: Referring Physician: Glendon Axe Treating Ryann Leavitt Physician/Extender: Weeks in Treatment: 18 Debridement Performed for Wound #2 Left,Distal Lower Leg Assessment: Performed By: Physician Pat Patrick., MD Debridement: Debridement Pre-procedure Yes Verification/Time Out Taken: Start Time: 12:01 Pain  Control: Lidocaine 4% Topical Solution Level: Skin/Subcutaneous Tissue Total Area Debrided (L x 6.4 (cm) x 5.4 (cm) = 34.56 (cm) W): Tissue and other Exudate, Fibrin/Slough, Subcutaneous material debrided: Instrument: Curette Bleeding: Minimum Hemostasis Achieved: Pressure End Time: 12:06 Procedural Pain: 0 Post Procedural Pain: 0 Response to Treatment: Procedure was tolerated well Post Debridement Measurements of Total Wound Length: (cm) 6.4 Width: (cm) 5.4 Depth: (cm) 0.5 Volume: (cm) 13.572 Electronic Signature(s) Signed: 06/10/2015 12:18:52 PM By: [name], [name] Signed: 06/10/2015 4:17:11 PM By: Regan Lemming [name], RN Previous Signature: 06/10/2015 12:04:26 PM Version By: Regan Lemming [name], RN Entered By: [name] on 06/10/2015 12:18:52 LAKEISHA, WALDROP ([id number]) -------------------------------------------------------------------------------- HPI Details Patient Name: CORIE, ALLIS D. 06/10/2015 11:45 Date of Service: AM Medical Record [id number] Number: Patient Account Number: [id number] June 11, 1946 (69 y.o. [name], RN, [name], Date of Birth/Sex: Treating RN: Female) Administrator, sports Primary Care Physician: Glendon Axe Other Clinician: Referring Physician: Glendon Axe Treating Consepcion Utt Physician/Extender: Weeks in Treatment: 18 History of Present Illness HPI Description: 69 year old patient who is known to have diabetes mellitus type 2, chronic renal insufficiency, coronary artery disease, hypertension, hypercholesterolemia, temporal arteritis and inflammatory arthritiss also has a history of having a hysterectomy and some orthopedic related surgeries. The ulcer on the left lower extremity started off as a blister and then. Got progressively worse. She does not have any fever or chills and has not had any recent surgical intervention for this. Her last hemoglobin A1c was 10.1 in September 2015. She has been recently put on doxycycline by her PCP. She is now  also allergic to doxycycline and was this was changed over to Keflex. 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 She does see a cardiologist and also takes a diuretic. 02/07/2015 her arterial and venous duplex studies to be done have dates been given  as the first and third week of June. This is at [facility]. We are going to try and get early appointments at [facility]. other than that nothing has changed in her management. 02/14/2015 -- we have been able to get her an appointment in [facility] on May 20 which is much earlier than her previous ones at [place]. She continues with her prednisone and her sugars are in the range of 150-200. 02/21/2015 We were able to get a vascular lab workup for her today and she is going to be there at 2:00 this afternoon. the swelling of her leg has gone down significantly but she still has some tenderness over the wounds. 02/28/2015 - She has had one of two vascular workups done, and this coming Tuesday has another, at [place] [facility]. She continues to be on steroid medications. She has significant sensitivity in her left lower extremity and has pain suggestive of neuropathic pain and I have asked her to address this with her primary care physician. 03/07/2015 -- The patient saw Dr. Lucky Cowboy for a consultation and he has had her arteries are okay but she has 2 incompetent veins on the left lower extremity and he is going to set her up for surgery. Official report is awaited. Addendum: Official reports are now available and on 03/04/2015. She was seen and lower extremity venous duplex exam was done. There was reflux present within the left greater saphenous vein below the knee and also the left small saphenous vein. Arterial duplex showed normal triphasic waveforms throughout the left lower extremity without any significant stenosis.  Her ABIs were noncompressible bilaterally but a waveforms were normal and a digital pressures were normal bilaterally consistent with no significant arterial insufficiency. He has recommended endovenous ablation of both the left small saphenous and the left great saphenous vein. This would still be scheduled later. 03/14/2015 -- she has heard back from the vascular office and has surgery scheduled for sometime in July. MAIREAD, SCHWARZKOPF ([id number]) Her rheumatologist has decreased her prednisone dosage but she still on it. She has also had cataract surgery in her right eye recently this week. 03/20/2015 - No new complaints today. Pain improved. No fever or chills. Tolerating 2 layer compression. 04/14/2015 -- she was doing very well today she went off on vacation and now her edema has increased markedly the ulceration is bigger and her diabetes is not under control. 04/21/2015 -- I spoke to her PCP Dr. Candiss Norse and discussed the management which would include being seen by a general surgeon for debridement and taking multiple punch biopsies which would help in establishing the diagnosis of this is a vasculitis. She is agreeable about this and will set her up for the procedure with Dr. Tamala Julian at [facility]. She was seen by the surgeon Dr. Jamal Collin. 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 She is scheduled to have laser ablation done next week.Pt was sent here for possible biopsy to look for vasculitis. Feel it would be better to wait after laser ablation is completed- the ulcer may heal fully and biopsy may not be necessary 04/29/2015 -- she had the venous ablation done by Dr. Lucky Cowboy last Friday and we do not have any notes yet. She is doing fine otherwise. 05/06/2015 --Review of her recent vascular intervention shows that she was seen by Dr. Lucky Cowboy on 04/29/2015. The follow-up duplex  which was done showed that both the  great saphenous vein and the small saphenous vein remained patent with reflux consistent with an unsuccessful ablation. He has rescheduled her for another the endovenous ablation to be done in about 4 weekso time. 05/13/2015 -- he was seen by her surgeon Dr. Jamal Collin who asked her to continue with conservative therapy and he would speak to Dr. Lucky Cowboy about her management. Dr. Lucky Cowboy is going to schedule her surgery in the middle of August for a repeat endovenous ablation. Her pus culture from last week has grown : [place] her noted her sensitivity report but due to her multiple allergies I had tried clindamycin and she developed a rash with this too. She has been prescribed and anti-buttocks in the ER and is has it at home and she will let is know what she is going to be taking. 05/20/2015 -- she has developed a small spot on her right lower extremity but besides that it is not a full fledged ulceration. She did not get to see Dr. Lucky Cowboy last week and hopefully she will see him in the near future. 05/27/2015 -she is still awaiting her appointment with Dr.Dew and her vascular procedure is not scheduled until August 19. She will be seeing her PCP tomorrow and I have asked her to convey our discussion so that she is aware that debridement has not been done yet. 06/03/2015 -- was seen by her rheumatologist Dr. 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 He is lowering her prednisone to 12-1/2 mg for 1 month and then 10 mg per the next month. I will again make an attempt to speak to her PCP Dr. Candiss Norse and her surgeon Dr. Lucky Cowboy to see if he can organize for a debridement in the OR with multiple biopsies to establish a diagnosis of vasculitis or pyoderma  gangrenosum. Electronic Signature(s) DEJANA, PUGSLEY ([id number]) Signed: 06/10/2015 12:19:06 PM By: [name], [name] Entered By: [name] on 06/10/2015 12:19:06 ROSHAUNDA, STARKEY ([id number]) -------------------------------------------------------------------------------- Physical Exam Details Patient Name: SHANTICE, MENGER 06/10/2015 11:45 Date of Service: AM Medical Record [id number] Number: Patient Account Number: [id number] Sep 12, 1946 (69 y.o. [name], RN, [name], Date of Birth/Sex: Treating RN: Female) [place] Primary Care Physician: Glendon Axe Other Clinician: Referring Physician: Candiss Norse, [name] Treating Hilary Pundt Physician/Extender: Weeks in Treatment: 18 Constitutional . Pulse regular. Respirations normal and unlabored. Afebrile. . Eyes Nonicteric. Reactive to light. Ears, Nose, Mouth, and Throat Lips, teeth, and gums WNL.Marland Kitchen Moist mucosa without lesions . Neck supple and nontender. No palpable supraclavicular or cervical adenopathy. Normal sized without goiter. Respiratory WNL. No retractions.. Cardiovascular Pedal Pulses WNL. still has persistent +1 pitting edema. Lymphatic No adneopathy. No adenopathy. No adenopathy. Musculoskeletal Adexa without tenderness or enlargement.. Digits and nails w/o clubbing, cyanosis, infection, petechiae, ischemia, or inflammatory conditions.. Integumentary (Hair, Skin) No suspicious lesions. No crepitus or fluctuance. No peri-wound warmth or erythema. No masses.Marland Kitchen Psychiatric Judgement and insight Intact.. No evidence of depression, anxiety, or agitation.. Notes The main wound has less slough and sharp debridement with a curette is done to help the central get into the deeper tissues. Electronic Signature(s) Signed: 06/10/2015 12:19:49 PM By: [name], [name] Entered By: [name] on 06/10/2015 12:19:48 ELECTA, STERRY  ([id number]) -------------------------------------------------------------------------------- Physician Orders Details Patient Name: RAJAH, LAMBA D. 06/10/2015 11:45 Date of Service: AM Medical Record [id number] Number: Patient Account Number: [id number] [birth year] (69  y.o. [name], RN, [name], Date of Birth/Sex: Treating RN: Female) [name] Primary Care Physician: Glendon Axe Other Clinician: Referring Physician: Glendon Axe Treating Kalinda Romaniello Physician/Extender: Weeks in Treatment: 18 Verbal / Phone Orders: Yes Clinician: [name], RN, [name], Rita Read Back and Verified: Yes Diagnosis Coding Wound Cleansing Wound #2 Left,Distal Lower Leg o Clean wound with Normal Saline. Wound #5 Right,Posterior Lower Leg o Clean wound with Normal Saline. Anesthetic Wound #2 Left,Distal Lower Leg o Topical Lidocaine 4% cream applied to wound bed prior to debridement Wound #5 Right,Posterior Lower Leg o Topical Lidocaine 4% cream applied to wound bed prior to debridement Primary Wound Dressing Wound #2 Left,Distal Lower Leg o Santyl Ointment Wound #5 Right,Posterior Lower Leg o Other: - betadine paint Secondary Dressing Wound #2 Left,Distal Lower Leg o ABD and Kerlix/Conform Wound #5 Right,Posterior Lower Leg o Boardered Foam Dressing Dressing Change Frequency Wound #2 Left,Distal Lower Leg o Change dressing every day. Wound #5 Right,Posterior Lower Leg o Change dressing every day. ZAHRAH, SUTHERLIN ([id number]) Follow-up Appointments Wound #2 Left,Distal Lower Leg o Return Appointment in 1 week. Wound #5 Right,Posterior Lower Leg o Return Appointment in 1 week. Edema Control Wound #2 Left,Distal Lower Leg o Support Garment 20-30 mm/Hg pressure to: Wound #5 Right,Posterior Lower Leg o Support Garment 20-30 mm/Hg pressure to: Additional Orders / Instructions Wound #2 Left,Distal Lower Leg o Increase protein intake. Wound #5 Right,Posterior Lower  Leg o Increase protein intake. Electronic Signature(s) Signed: 06/10/2015 12:05:18 PM By: Regan Lemming [name], RN Signed: 06/10/2015 12:24:58 PM By: [name], [name] Entered By: Regan Lemming on 06/10/2015 12:05:18 LACEY, WALLMAN ([id number]) -------------------------------------------------------------------------------- Problem List Details Patient Name: JADIN, KAGEL 06/10/2015 11:45 Date of Service: AM Medical Record [id number] Number: Patient Account Number: [id number] 1946-06-26 (69 y.o. 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 06/10/2015 11:45 Date of Service: AM Medical Record [id number] Number: Patient Account Number: [id number] 08-17-1946 (69 y.o. [name], RN, [name], Date of Birth/Sex: Treating RN: Female) Administrator, sports Primary Care Physician: Glendon Axe Other Clinician: Referring Physician: Glendon Axe  Treating Chrishelle Zito Physician/Extender: Weeks in Treatment: 18 Subjective Chief Complaint Information obtained from Patient Patient presents to the wound care center for a consult due non healing wound 69 year old patient comes with a history of having a ulcer on the left lower extremity for the past 4 weeks. she says she's had swelling of both lower extremities for about a year after she started having prednisone. 02/07/2015 -- her vascular appointments obtained were in the first and third week of June. she is able to go to [place] and we will try and get her some earlier appointments. Other than that nothing else has changed in her management. History of  Present Illness (HPI) 69 year old patient who is known to have diabetes mellitus type 2, chronic renal insufficiency, coronary artery disease, hypertension, hypercholesterolemia, temporal arteritis and inflammatory arthritiss also has a history of having a hysterectomy and some orthopedic related surgeries. The ulcer on the left lower extremity started off as a blister and then. Got progressively worse. She does not have any fever or chills and has not had any recent surgical intervention for this. Her last hemoglobin A1c was 10.1 in September 2015. She has been recently put on doxycycline by her PCP. She is now also allergic to doxycycline and was this was changed over to Keflex. 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 She does see a cardiologist and also takes a diuretic. 02/07/2015 her arterial and venous duplex studies to be done have dates been given as the first and third week of June. This is at [facility] We are going to try and get early appointments at [facility]. other than that nothing has changed in her management. 02/14/2015 -- we have been able to get her an appointment in [facility] on May 20 which is  much earlier than her previous ones at [place]. She continues with her prednisone and her sugars are in the range of 150-200. 02/21/2015 We were able to get a vascular lab workup for her today and she is going to be there at 2:00 this afternoon. the swelling of her leg has gone down significantly but she still has some tenderness over the wounds. 02/28/2015 - She has had one of two vascular workups done, and this coming Tuesday has another, at [place] [facility]. She continues to be on steroid medications. She has significant sensitivity in her left lower extremity and has pain suggestive of neuropathic pain and I have asked her to address this with her primary care physician. 03/07/2015 -- The patient saw Dr. Lucky Cowboy for a consultation and he has had her arteries are okay but she has 2 incompetent veins on the left lower extremity and he is going to set her up for surgery. Official report is EVANELL, REDLICH ([id number]) awaited. Addendum: Official reports are now available and on 03/04/2015. She was seen and lower extremity venous duplex exam was done. There was reflux present within the left greater saphenous vein below the knee and also the left small saphenous vein. Arterial duplex showed normal triphasic waveforms throughout the left lower extremity without any significant stenosis. Her ABIs were noncompressible bilaterally but a waveforms were normal and a digital pressures were normal bilaterally consistent with no significant arterial insufficiency. He has recommended endovenous ablation of both the left small saphenous and the left great saphenous vein. This would still be scheduled later. 03/14/2015 -- she has heard back from the vascular office and has surgery scheduled for sometime in July. Her rheumatologist has decreased her prednisone dosage but she still on it. She has also had cataract surgery in her right eye recently this week. 03/20/2015 - No new complaints  today. Pain improved. No fever or chills. Tolerating 2 layer compression. 04/14/2015 -- she was doing very well today she went off on vacation and now her edema has increased markedly the ulceration is bigger and her diabetes is not under control. 04/21/2015 -- I spoke to her PCP Dr. Candiss Norse and discussed the management which would include being seen by a general surgeon for debridement and  taking multiple punch biopsies which would help in establishing the diagnosis of this is a vasculitis. She is agreeable about this and will set her up for the procedure with Dr. Tamala Julian at [facility]. She was seen by the surgeon Dr. Jamal Collin. 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 She is scheduled to have laser ablation done next week.Pt was sent here for possible biopsy to look for vasculitis. Feel it would be better to wait after laser ablation is completed- the ulcer may heal fully and biopsy may not be necessary 04/29/2015 -- she had the venous ablation done by Dr. Lucky Cowboy last Friday and we do not have any notes yet. She is doing fine otherwise. 05/06/2015 --Review of her recent vascular intervention shows that she was seen by Dr. Lucky Cowboy on 04/29/2015. The follow-up duplex which was done showed that both the great saphenous vein and the small saphenous vein remained patent with reflux consistent with an unsuccessful ablation. He has rescheduled her for another the endovenous ablation to be done in about 4 weeks time. 05/13/2015 -- he was seen by her surgeon Dr. Jamal Collin who asked her to continue with conservative therapy and he would speak to Dr. Lucky Cowboy about her management. Dr. Lucky Cowboy is going to schedule her surgery in the middle of August for a repeat endovenous ablation. Her pus culture from last week has grown : [place] her noted her sensitivity report but due to her multiple allergies I had tried clindamycin and she developed a rash with this too. She has been prescribed and anti-buttocks in the ER and is has it at home and she will let is know what she is going to be taking. 05/20/2015 -- she has developed a small spot on her right lower extremity but besides that it is not a full fledged ulceration. She did not get to see Dr. Lucky Cowboy last week and hopefully she will see him in the near future. 05/27/2015 -she is still awaiting her appointment with Dr.Dew and her vascular procedure is not scheduled until August 19. She will be seeing her PCP tomorrow and I have asked her to convey our discussion so that she is aware that debridement has not been done yet. RITI, ROLLYSON ([id number]) 06/03/2015 -- was seen by her rheumatologist Dr. 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 He is lowering her prednisone to 12-1/2 mg for 1 month and then 10 mg per the next month. I will again make an attempt to speak to her PCP Dr. Candiss Norse and her surgeon Dr. 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 Objective Constitutional Pulse regular. Respirations normal and unlabored. Afebrile. Vitals Time Taken: 11:50 AM, Height: 65 in, Weight: 248 lbs, BMI: 41.3, Temperature: 97.8 F, Pulse: 70 bpm, Respiratory Rate: 17 breaths/min, Blood Pressure: 139/57 mmHg. Eyes Nonicteric. Reactive to light. Ears, Nose, Mouth, and Throat Lips, teeth, and gums WNL.Marland Kitchen Moist mucosa without lesions . Neck supple and nontender. No palpable supraclavicular or cervical adenopathy. Normal sized without goiter. Respiratory WNL. No retractions.. Cardiovascular Pedal Pulses WNL. still has persistent +1 pitting  edema. Lymphatic No adneopathy. No adenopathy. No adenopathy. Musculoskeletal Adexa without tenderness or enlargement.. Digits and  nails w/o clubbing, cyanosis, infection, petechiae, ischemia, or inflammatory conditions.Marland Kitchen Psychiatric Judgement and insight Intact.. No evidence of depression, anxiety, or agitation.. General Notes: The main wound has less slough and sharp debridement with a curette is done to help the central get into the deeper tissues. Loux, Catherine D. ([id number]) Integumentary (Hair, Skin) No suspicious lesions. No crepitus or fluctuance. No peri-wound warmth or erythema. No masses.. Wound #2 status is Open. Original cause of wound was Blister. The wound is located on the Left,Distal Lower Leg. The wound measures 6.4cm length x 5.4cm width x 0.5cm depth; 27.143cm^2 area and 13.572cm^3 volume. The wound is limited to skin breakdown. There is no tunneling or undermining noted. There is a medium amount of serosanguineous drainage noted. The wound margin is distinct with the outline attached to the wound base. There is small (1-33%) red, pink granulation within the wound bed. There is a large (67-100%) amount of necrotic tissue within the wound bed including Eschar and Adherent Slough. The periwound skin appearance exhibited: Localized Edema, Moist, Hemosiderin Staining. The periwound skin appearance did not exhibit: Callus, Crepitus, Excoriation, Fluctuance, Friable, Induration, Rash, Scarring, Dry/Scaly, Maceration, Atrophie Blanche, Cyanosis, Ecchymosis, Mottled, Pallor, Rubor, Erythema. Periwound temperature was noted as No Abnormality. The periwound has tenderness on palpation. Wound #5 status is Open. Original cause of wound was Gradually Appeared. The wound is located on the Right,Posterior Lower Leg. The wound measures 0.1cm length x 0.1cm width x 0.1cm depth; 0.008cm^2 area and 0.001cm^3 volume. The wound is limited to skin breakdown. There is no tunneling or  undermining noted. There is a none present amount of drainage noted. The wound margin is flat and intact. There is no granulation within the wound bed. There is a large (67-100%) amount of necrotic tissue within the wound bed including Eschar. The periwound skin appearance exhibited: Localized Edema, Dry/Scaly, Hemosiderin Staining. The periwound skin appearance did not exhibit: Callus, Crepitus, Excoriation, Fluctuance, Friable, Induration, Rash, Scarring, Maceration, Moist, Atrophie Blanche, Cyanosis, Ecchymosis, Mottled, Pallor, Rubor, Erythema. Periwound temperature was noted as No Abnormality. The periwound has tenderness on palpation. 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 She is going to have her vascular procedure this coming Friday and I have asked her to let Dr.Dew know that if possible we would appreciate some debridement and a biopsy of the wound edges. BRIGITTE, SODERBERG ([id number]) Procedures Wound #2 Wound #2 is a Venous Leg Ulcer located on the Left,Distal Lower Leg . There was a Skin/Subcutaneous Tissue Debridement ([id number]) debridement with total area of 34.56 sq cm performed by Pat Patrick., MD. with the following instrument(s): Curette including Exudate, Fibrin/Slough, and Subcutaneous after achieving pain control using Lidocaine 4% Topical Solution. A time out was conducted prior to the start of the procedure. A Minimum amount of bleeding was controlled with Pressure. The  procedure was tolerated well with a pain level of 0 throughout and a pain level of 0 following the procedure. Post Debridement Measurements: 6.4cm length x 5.4cm width x 0.5cm depth; 13.572cm^3 volume. Plan Wound Cleansing: Wound #2  Left,Distal Lower Leg: Clean wound with Normal Saline. Wound #5 Right,Posterior Lower Leg: Clean wound with Normal Saline. Anesthetic: Wound #2 Left,Distal Lower Leg: Topical Lidocaine 4% cream applied to wound bed prior to debridement Wound #5 Right,Posterior Lower Leg: Topical Lidocaine 4% cream applied to wound bed prior to debridement Primary Wound Dressing: Wound #2 Left,Distal Lower Leg: Santyl Ointment Wound #5 Right,Posterior Lower Leg: Other: - betadine paint Secondary Dressing: Wound #2 Left,Distal Lower Leg: ABD and Kerlix/Conform Wound #5 Right,Posterior Lower Leg: Boardered Foam Dressing Dressing Change Frequency: Wound #2 Left,Distal Lower Leg: Change dressing every day. Wound #5 Right,Posterior Lower Leg: Change dressing every day. Follow-up Appointments: Wound #2 Left,Distal Lower Leg: Return Appointment in 1 week. Wound #5 Right,Posterior Lower Leg: Return Appointment in 1 week. Edema Control: Wound #2 Left,Distal Lower Leg: Sensabaugh, Meyer D. ([id number]) Support Garment 20-30 mm/Hg pressure to: Wound #5 Right,Posterior Lower Leg: Support Garment 20-30 mm/Hg pressure to: Additional Orders / Instructions: Wound #2 Left,Distal Lower Leg: Increase protein intake. Wound #5 Right,Posterior Lower Leg: Increase protein intake. We will continue use Santyl on the main wound and paint the right leg with Betadine. She is going to have her vascular procedure this coming Friday and I have asked her to let Dr.Dew know that if possible we would appreciate some debridement and a biopsy of the wound edges. Electronic Signature(s) Signed: 06/10/2015 12:21:08 PM By: [name], [name] Entered By: [name] on 06/10/2015 12:21:08 Maceachern,  Tia Masker ([id number]) -------------------------------------------------------------------------------- SuperBill Details Patient Name: YOULANDA, TOMASSETTI D. Date of Service: 06/10/2015 Medical Record Patient Account Number: [id number] Number: [name], RN, [name], Treating RN: 02/21/1946 (69 y.o. 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 Facility Procedures CPT4: Description Modifier Quantity Code 78469629 11042 - DEB SUBQ TISSUE 20 SQ CM/< 1 ICD-10 Description Diagnosis E11.622 Type 2 diabetes mellitus with other skin ulcer I83.222 Varicose veins of left lower extremity with both ulcer of calf and  inflammation I83.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

## 2015-06-17 ENCOUNTER — Encounter: Payer: PPO | Admitting: Surgery

## 2015-06-17 DIAGNOSIS — L97322 Non-pressure chronic ulcer of left ankle with fat layer exposed: Secondary | ICD-10-CM | POA: Diagnosis not present

## 2015-06-18 NOTE — Progress Notes (Signed)
KONNI, KESINGER ([id number]) Visit Report for 06/17/2015 Arrival Information Details Patient Name: [name], [name] Date of Service: 06/17/2015 11:30 AM Medical Record Number: [id number] Patient Account Number: [id number] Date of Birth/Sex: Nov 10, 1945 (69 y.o. Female) Treating RN: [name] Primary Care Physician: Glendon Axe Other Clinician: Referring Physician: Glendon Axe Treating Physician/Extender: Frann Rider in Treatment: 19 Visit Information History Since Last Visit Added or deleted any medications: No Patient Arrived: Cane Any new allergies or adverse reactions: No Arrival Time: 11:23 Had a fall or experienced change in No Accompanied By: self activities of daily living that may affect Transfer Assistance: None risk of falls: Patient Identification Verified: Yes Signs or symptoms of abuse/neglect since last No Secondary Verification Process Yes visito Completed: Hospitalized since last visit: No Patient Has Alerts: Yes Has Dressing in Place as Prescribed: Yes Patient Alerts: Patient on Blood Pain Present Now: No Thinner Electronic Signature(s) Signed: 06/17/2015 5:29:15 PM By: [name], RN, [name], [name], [name] Entered By: [name], RN, [name], Kim on 06/17/2015 11:24:12 Guerrera, Tia Masker ([id number]) -------------------------------------------------------------------------------- Encounter Discharge Information Details Patient Name: [name], [name] Date of Service: 06/17/2015 11:30 AM Medical Record Number: [id number] Patient Account Number: [id number] Date of Birth/Sex: 1946/02/12 (69 y.o. Female) Treating RN: [name] Primary Care Physician: Glendon Axe Other Clinician: Referring Physician: Glendon Axe Treating Physician/Extender: Frann Rider in Treatment: 3 Encounter Discharge Information Items Discharge Pain Level: 0 Discharge Condition: Stable Ambulatory Status: Cane Discharge Destination: Home Transportation: Private Auto Accompanied By:  self Schedule Follow-up Appointment: Yes Medication Reconciliation completed Yes and provided to Patient/Care Dierdre Mccalip: Provided on Clinical Summary of Care: 06/17/2015 Form Type Recipient Paper Patient LM Electronic Signature(s) Signed: 06/17/2015 11:55:32 AM By: Ruthine Dose Entered By: Ruthine Dose on 06/17/2015 11:55:32 Molzahn, Makya D. ([id number]) -------------------------------------------------------------------------------- Lower Extremity Assessment Details Patient Name: Kristin Stare D. Date of Service: 06/17/2015 11:30 AM Medical Record Number: [id number] Patient Account Number: [id number] Date of Birth/Sex: [birth year] (69 y.o. Female) Treating RN: [name] Primary Care Physician: Glendon Axe Other Clinician: Referring Physician: Glendon Axe Treating Physician/Extender: Frann Rider in Treatment: 19 Edema Assessment Assessed: [Left: No] [Right: No] E[Left: dema] [Right: :] Calf Left: Right: Point of Measurement: cm From Medial Instep 36.6 cm 37.5 cm Ankle Left: Right: Point of Measurement: cm From Medial Instep 22.5 cm 24 cm Vascular Assessment Pulses: Posterior Tibial Dorsalis Pedis Palpable: [Left:Yes] [Right:Yes] Extremity colors, hair growth, and conditions: Extremity Color: [Left:Hyperpigmented] [Right:Hyperpigmented] Hair Growth on Extremity: [Left:No] [Right:No] Temperature of Extremity: [Left:Warm] [Right:Warm] Capillary Refill: [Left:< 3 seconds] [Right:< 3 seconds] Toe Nail Assessment Left: Right: Thick: No No Discolored: No No Deformed: No No Improper Length and Hygiene: No No Electronic Signature(s) Signed: 06/17/2015 5:29:15 PM By: [name], RN, [name], [name], [name] Entered By: [name], RN, [name], Kim on 06/17/2015 11:29:01 Kawamoto, Khai D. ([id number]) -------------------------------------------------------------------------------- Multi Wound Chart Details Patient Name: Kristin Stare D. Date of Service: 06/17/2015 11:30 AM Medical Record  Number: [id number] Patient Account Number: [id number] Date of Birth/Sex: Oct 14, 1946 (69 y.o. Female) Treating RN: [name] Primary Care Physician: Glendon Axe Other Clinician: Referring Physician: Glendon Axe Treating Physician/Extender: Frann Rider in Treatment: 19 Vital Signs Height(in): 65 Pulse(bpm): 68 Weight(lbs): 248 Blood Pressure 146/70 (mmHg): Body Mass Index(BMI): 41 Temperature(F): 97.6 Respiratory Rate 18 (breaths/min): Photos: [2:No Photos] [5:No Photos] [N/A:N/A] Wound Location: [2:Left Lower Leg - Distal Right Lower Leg -] [5:Posterior] [N/A:N/A] Wounding Event: [2:Blister] [5:Gradually Appeared] [N/A:N/A] Primary Etiology: [2:Venous Leg Ulcer] [5:Venous Leg Ulcer] [N/A:N/A] Comorbid History: [2:Cataracts, Asthma, Coronary Artery Disease, Coronary  Artery Disease, Hypertension, Type II Diabetes, Osteoarthritis, Diabetes, Osteoarthritis, Neuropathy] [5:Cataracts, Asthma, Hypertension, Type II Neuropathy] [N/A:N/A] Date Acquired: [2:12/30/2014] [5:04/29/2015] [N/A:N/A] Weeks of Treatment: [2:19] [5:4] [N/A:N/A] Wound Status: [2:Open] [5:Open] [N/A:N/A] Clustered Wound: [2:Yes] [5:No] [N/A:N/A] Measurements L x W x D 5.5x5.2x0.5 [5:0.4x0.7x0.1] [N/A:N/A] (cm) Area (cm) : [2:22.462] [5:0.22] [N/A:N/A] Volume (cm) : [2:11.231] [5:0.022] [N/A:N/A] % Reduction in Area: [2:-3077.10%] [5:-17.00%] [N/A:N/A] % Reduction in Volume: -15718.30% [5:-15.80%] [N/A:N/A] Classification: [2:Full Thickness Without Exposed Support Structures] [5:Partial Thickness] [N/A:N/A] HBO Classification: [2:Grade 1] [5:Grade 1] [N/A:N/A] Exudate Amount: [2:Medium] [5:None Present] [N/A:N/A] Exudate Type: [2:Purulent] [5:N/A] [N/A:N/A] Exudate Color: [2:yellow, brown, green] [5:N/A] [N/A:N/A] Wound Margin: [2:Distinct, outline attached Flat and Intact] [N/A:N/A] Granulation Amount: [2:Small (1-33%)] [5:None Present (0%)] [N/A:N/A] Granulation Quality: [5:N/A] [N/A:N/A] Red,  Pink, Hyper- granulation Necrotic Amount: Large (67-100%) Large (67-100%) N/A Necrotic Tissue: Adherent Slough Eschar N/A Exposed Structures: Fascia: No Fascia: No N/A Fat: No Fat: No Tendon: No Tendon: No Muscle: No Muscle: No Joint: No Joint: No Bone: No Bone: No Limited to Skin Limited to Skin Breakdown Breakdown Epithelialization: None None N/A Periwound Skin Texture: Edema: Yes Edema: Yes N/A Excoriation: No Excoriation: No Induration: No Induration: No Callus: No Callus: No Crepitus: No Crepitus: No Fluctuance: No Fluctuance: No Friable: No Friable: No Rash: No Rash: No Scarring: No Scarring: No Periwound Skin Moist: Yes Dry/Scaly: Yes N/A Moisture: Maceration: No Maceration: No Dry/Scaly: No Moist: No Periwound Skin Color: Hemosiderin Staining: Yes Hemosiderin Staining: Yes N/A Atrophie Blanche: No Atrophie Blanche: No Cyanosis: No Cyanosis: No Ecchymosis: No Ecchymosis: No Erythema: No Erythema: No Mottled: No Mottled: No Pallor: No Pallor: No Rubor: No Rubor: No Temperature: No Abnormality No Abnormality N/A Tenderness on Yes Yes N/A Palpation: Wound Preparation: Ulcer Cleansing: Ulcer Cleansing: N/A Rinsed/Irrigated with Rinsed/Irrigated with Saline Saline Topical Anesthetic Topical Anesthetic Applied: Other: lidocaine Applied: Other: lidocaine 4% 4% Treatment Notes Electronic Signature(s) Signed: 06/17/2015 5:29:15 PM By: [name], RN, [name], [name], [name] Entered By: [name], RN, [name], Kim on 06/17/2015 11:33:15 Mefferd, CAITRIN PENDERGRAPH ([id number]) RASHAWNDA, GABA ([id number]) -------------------------------------------------------------------------------- Multi-Disciplinary Care Plan Details Patient Name: AVILYN, VIRTUE. Date of Service: 06/17/2015 11:30 AM Medical Record Number: [id number] Patient Account Number: [id number] Date of Birth/Sex: 10/28/1945 (68 y.o. Female) Treating RN: [name] Primary Care Physician: Glendon Axe Other  Clinician: Referring Physician: Glendon Axe Treating Physician/Extender: Frann Rider in Treatment: 60 Active Inactive Orientation to the Wound Care Program Nursing Diagnoses: Knowledge deficit related to the wound healing center program Goals: Patient/caregiver will verbalize understanding of the [facility] Program Date Initiated: 01/31/2015 Goal Status: Active Interventions: Provide education on orientation to the wound center Notes: Venous Leg Ulcer Nursing Diagnoses: Potential for venous Insuffiency (use before diagnosis confirmed) Goals: Non-invasive venous studies are completed as ordered Date Initiated: 01/31/2015 Goal Status: Active Patient/caregiver will verbalize understanding of disease process and disease management Date Initiated: 01/31/2015 Goal Status: Active Interventions: Assess peripheral edema status every visit. Notes: Wound/Skin Impairment Nursing Diagnoses: Impaired tissue integrity Knowledge deficit related to smoking impact on wound healing Gaspar, Necia D. ([id number]) Goals: Patient/caregiver will verbalize understanding of skin care regimen Date Initiated: 01/31/2015 Goal Status: Active Ulcer/skin breakdown will heal within 14 weeks Date Initiated: 01/31/2015 Goal Status: Active Interventions: Assess ulceration(s) every visit Notes: Electronic Signature(s) Signed: 06/17/2015 5:29:15 PM By: [name], RN, [name], [name], [name] Entered By: [name], RN, [name], Kim on 06/17/2015 11:33:06 Tilghman, Quanda D. ([id number]) -------------------------------------------------------------------------------- Pain Assessment Details Patient Name: CARLISA, EBLE D. Date of Service: 06/17/2015 11:30 AM Medical  Record Number: [id number] Patient Account Number: [id number] Date of Birth/Sex: Sep 03, 1946 (68 y.o. Female) Treating RN: [name] Primary Care Physician: Glendon Axe Other Clinician: Referring Physician: Glendon Axe Treating Physician/Extender:  Frann Rider in Treatment: 19 Active Problems Location of Pain Severity and Description of Pain Patient Has Paino No Site Locations Pain Management and Medication Current Pain Management: Electronic Signature(s) Signed: 06/17/2015 5:29:15 PM By: [name], RN, [name], [name], [name] Entered By: [name], RN, [name], Kim on 06/17/2015 11:24:27 Likes, Tia Masker ([id number]) -------------------------------------------------------------------------------- Patient/Caregiver Education Details Patient Name: Kristin Stare D. Date of Service: 06/17/2015 11:30 AM Medical Record Number: [id number] Patient Account Number: [id number] Date of Birth/Gender: November 20, 1945 (68 y.o. Female) Treating RN: [name] Primary Care Physician: Glendon Axe Other Clinician: Referring Physician: Glendon Axe Treating Physician/Extender: Frann Rider in Treatment: 43 Education Assessment Education Provided To: Patient Education Topics Provided Wound/Skin Impairment: Handouts: Caring for Your Ulcer, Other: continue wound care as prescribed Methods: Demonstration, Explain/Verbal Responses: State content correctly Electronic Signature(s) Signed: 06/17/2015 5:29:15 PM By: [name], RN, [name], [name], [name] Entered By: [name], RN, [name], Kim on 06/17/2015 11:44:13 Dargan, Sutton DMarland Kitchen ([id number]) -------------------------------------------------------------------------------- Wound Assessment Details Patient Name: [name], [name] Date of Service: 06/17/2015 11:30 AM Medical Record Number: [id number] Patient Account Number: [id number] Date of Birth/Sex: Mar 30, 1946 (68 y.o. Female) Treating RN: [name] Primary Care Physician: Glendon Axe Other Clinician: Referring Physician: Glendon Axe Treating Physician/Extender: Frann Rider in Treatment: 19 Wound Status Wound Number: 2 Primary Venous Leg Ulcer Etiology: Wound Location: Left Lower Leg - Distal Wound Open Wounding Event: Blister Status: Date Acquired:  12/30/2014 Comorbid Cataracts, Asthma, Coronary Artery Weeks Of Treatment: 19 History: Disease, Hypertension, Type II Clustered Wound: Yes Diabetes, Osteoarthritis, Neuropathy Photos Photo Uploaded By: [name], RN, [name], Kim on 06/17/2015 17:13:26 Wound Measurements Length: (cm) 5.5 % Reduction in Width: (cm) 5.2 % Reduction in Depth: (cm) 0.5 Epithelializati Area: (cm) 22.462 Tunneling: Volume: (cm) 11.231 Undermining: Area: -3077.1% Volume: -15718.3% on: None No No Wound Description Full Thickness Without Classification: Exposed Support Structures Diabetic Severity Grade 1 (Wagner): APOLLONIA, AMINI ([id number]) Foul Odor After Cleansing: No Wound Margin: Distinct, outline attached Exudate Amount: Medium Exudate Type: Purulent Exudate Color: yellow, brown, green Wound Bed Granulation Amount: Small (1-33%) Exposed Structure Granulation Quality: Red, Pink, Hyper-granulation Fascia Exposed: No Necrotic Amount: Large (67-100%) Fat Layer Exposed: No Necrotic Quality: Adherent Slough Tendon Exposed: No Muscle Exposed: No Joint Exposed: No Bone Exposed: No Limited to Skin Breakdown Periwound Skin Texture Texture Color No Abnormalities Noted: No No Abnormalities Noted: No Callus: No Atrophie Blanche: No Crepitus: No Cyanosis: No Excoriation: No Ecchymosis: No Fluctuance: No Erythema: No Friable: No Hemosiderin Staining: Yes Induration: No Mottled: No Localized Edema: Yes Pallor: No Rash: No Rubor: No Scarring: No Temperature / Pain Moisture Temperature: No Abnormality No Abnormalities Noted: No Tenderness on Palpation: Yes Dry / Scaly: No Maceration: No Moist: Yes Wound Preparation Ulcer Cleansing: Rinsed/Irrigated with Saline Topical Anesthetic Applied: Other: lidocaine 4%, Treatment Notes Wound #2 (Left, Distal Lower Leg) 1. Cleansed with: Clean wound with Normal Saline 2. Anesthetic Topical Lidocaine 4% cream to wound bed prior to  debridement 4. Dressing Applied: Santyl Ointment 5. Secondary Dressing Applied Touchet, Andreana D. ([id number]) Non-Adherent pad ABD and Kerlix/Conform 7. Secured with Tape Support Garment 20-30 mm/Hg pressure to: Notes betadine paint to the right posterior Electronic Signature(s) Signed: 06/17/2015 5:29:15 PM By: [name], RN, [name], [name], [name] Entered By: [name], RN, [name], Kim on 06/17/2015 11:32:24 Kleeman, Ronniesha D. ([id number]) --------------------------------------------------------------------------------  Wound Assessment Details Patient Name: [name], [name] Date of Service: 06/17/2015 11:30 AM Medical Record Number: [id number] Patient Account Number: [id number] Date of Birth/Sex: 02/27/1946 (68 y.o. Female) Treating RN: [name] Primary Care Physician: Glendon Axe Other Clinician: Referring Physician: Glendon Axe Treating Physician/Extender: Frann Rider in Treatment: 19 Wound Status Wound Number: 5 Primary Venous Leg Ulcer Etiology: Wound Location: Right Lower Leg - Posterior Wound Open Wounding Event: Gradually Appeared Status: Date Acquired: 04/29/2015 Comorbid Cataracts, Asthma, Coronary Artery Weeks Of Treatment: 4 History: Disease, Hypertension, Type II Clustered Wound: No Diabetes, Osteoarthritis, Neuropathy Photos Photo Uploaded By: [name], RN, [name], Kim on 06/17/2015 17:13:27 Wound Measurements Length: (cm) 0.4 Width: (cm) 0.7 Depth: (cm) 0.1 Area: (cm) 0.22 Volume: (cm) 0.022 % Reduction in Area: -17% % Reduction in Volume: -15.8% Epithelialization: None Wound Description Classification: Partial Thickness Diabetic Severity Earleen Newport): Grade 1 Wound Margin: Flat and Intact Exudate Amount: None Present Wound Bed Granulation Amount: None Present (0%) Exposed Structure Necrotic Amount: Large (67-100%) Fascia Exposed: No Necrotic Quality: Eschar Fat Layer Exposed: No Tendon Exposed: No Muscle Exposed: No [place], Aleana D. ([id number]) Joint  Exposed: No Bone Exposed: No Limited to Skin Breakdown Periwound Skin Texture Texture Color No Abnormalities Noted: No No Abnormalities Noted: No Callus: No Atrophie Blanche: No Crepitus: No Cyanosis: No Excoriation: No Ecchymosis: No Fluctuance: No Erythema: No Friable: No Hemosiderin Staining: Yes Induration: No Mottled: No Localized Edema: Yes Pallor: No Rash: No Rubor: No Scarring: No Temperature / Pain Moisture Temperature: No Abnormality No Abnormalities Noted: No Tenderness on Palpation: Yes Dry / Scaly: Yes Maceration: No Moist: No Wound Preparation Ulcer Cleansing: Rinsed/Irrigated with Saline Topical Anesthetic Applied: Other: lidocaine 4%, Treatment Notes Wound #5 (Right, Posterior Lower Leg) 1. Cleansed with: Clean wound with Normal Saline Notes betadine paint to the right posterior Electronic Signature(s) Signed: 06/17/2015 5:29:15 PM By: [name], RN, [name], [name], [name] Entered By: [name], RN, [name], Kim on 06/17/2015 11:32:51 Esses, Tia Masker ([id number]) -------------------------------------------------------------------------------- Vitals Details Patient Name: Kristin Stare D. Date of Service: 06/17/2015 11:30 AM Medical Record Number: [id number] Patient Account Number: [id number] Date of Birth/Sex: [birth year] (68 y.o. Female) Treating RN: [name] Primary Care Physician: Glendon Axe Other Clinician: Referring Physician: Glendon Axe Treating Physician/Extender: Frann Rider in Treatment: 19 Vital Signs Time Taken: 11:25 Temperature (F): 97.6 Height (in): 65 Pulse (bpm): 68 Weight (lbs): 248 Respiratory Rate (breaths/min): 18 Body Mass Index (BMI): 41.3 Blood Pressure (mmHg): 146/70 Reference Range: 80 - 120 mg / dl Electronic Signature(s) Signed: 06/17/2015 5:29:15 PM By: [name], RN, [name], [name], [name] Entered By: [name], RN, [name], Kim on 06/17/2015 11:25:27

## 2015-06-18 NOTE — Progress Notes (Signed)
NIEMAH, SCHWEBKE ([id number]) Visit Report for 06/17/2015 Chief Complaint Document Details Patient Name: [name], [name] 06/17/2015 11:30 Date of Service: AM Medical Record [id number] Number: Patient Account Number: [id number] September 17, 1946 (69 y.o. Treating RN: [name] Date of Birth/Sex: Female) Other Clinician: Primary Care Physician: [facility], Delana Meyer Treating [name] Referring Physician: Glendon Axe Physician/Extender: Weeks in Treatment: 19 Information Obtained from: Patient Chief Complaint Patient presents to the wound care center for a consult due non healing wound 69 year old patient comes with a history of having a ulcer on the left lower extremity for the past 4 weeks. she says she's had swelling of both lower extremities for about a year after she started having prednisone. 02/07/2015 -- her vascular appointments obtained were in the first and third week of June. she is able to go to [place] and we will try and get her some earlier appointments. Other than that nothing else has changed in her management. Electronic Signature(s) Signed: 06/17/2015 11:44:18 AM By: [name], [name] Entered By: [name] on 06/17/2015 11:44:17 Kercheval, SVEA PUSCH ([id number]) -------------------------------------------------------------------------------- Debridement Details Patient Name: [name], [name] 06/17/2015 11:30 Date of Service: AM Medical Record [id number] Number: Patient Account Number: [id number] 04/01/1946 (69 y.o. Treating RN: [name] Date of Birth/Sex: Female) Other Clinician: Primary Care Physician: [facility], Delana Meyer Treating Ahri Olson Referring Physician: Glendon Axe Physician/Extender: Weeks in Treatment: 19 Debridement Performed for Wound #2 Left,Distal Lower Leg Assessment: Performed By: Physician Pat Patrick., MD Debridement: Debridement Pre-procedure Yes Verification/Time Out Taken: Start Time: 11:37 Pain Control: Other :  lidocaine 4% Level: Skin/Subcutaneous Tissue Total Area Debrided (L x 5 (cm) x 3 (cm) = 15 (cm) W): Tissue and other Viable, Non-Viable, Exudate, Fibrin/Slough, Subcutaneous material debrided: Instrument: Forceps, Scissors Bleeding: None End Time: 11:39 Procedural Pain: 0 Post Procedural Pain: 0 Response to Treatment: Procedure was tolerated well Post Debridement Measurements of Total Wound Length: (cm) 5.5 Width: (cm) 5.2 Depth: (cm) 0.5 Volume: (cm) 11.231 Post Procedure Diagnosis Same as Pre-procedure Electronic Signature(s) Signed: 06/17/2015 11:44:09 AM By: [name], [name] Signed: 06/17/2015 5:20:03 PM By: [name] Entered By: [name] on 06/17/2015 11:44:09 [name], [name] Kitchen ([id number]) -------------------------------------------------------------------------------- HPI Details Patient Name: [name], [name] 06/17/2015 11:30 Date of Service: AM Medical Record [id number] Number: Patient Account Number: [id number] 1946/07/20 (69 y.o. Treating RN: [name] Date of Birth/Sex: Female) Other Clinician: Primary Care Physician: [facility], Delana Meyer Treating Naydeen Speirs Referring Physician: Glendon Axe Physician/Extender: Weeks in Treatment: 72 History of Present Illness HPI Description: 69 year old patient who is known to have diabetes mellitus type 2, chronic renal insufficiency, coronary artery disease, hypertension, hypercholesterolemia, temporal arteritis and inflammatory arthritiss also has a history of having a hysterectomy and some orthopedic related surgeries. The ulcer on the left lower extremity started off as a blister and then. Got progressively worse. She does not have any fever or chills and has not had any recent surgical intervention for this. Her last hemoglobin A1c was 10.1 in September 2015. She has been recently put on doxycycline by her PCP. She is now also allergic to doxycycline and was this was changed over to Keflex. due to  her temporal arteritis she has been on prednisone for about a year and she says ever since that she has had swelling of both lower extremities. She does see a cardiologist and also takes a diuretic. 02/07/2015 her arterial and venous duplex studies to be done have dates been given as the first and third week of June. This is at [facility].  We are going to try and get early appointments at [facility]. other than that nothing has changed in her management. 02/14/2015 -- we have been able to get her an appointment in [facility] on May 20 which is much earlier than her previous ones at [place]. She continues with her prednisone and her sugars are in the range of 150-200. 02/21/2015 We were able to get a vascular lab workup for her today and she is going to be there at 2:00 this afternoon. the swelling of her leg has gone down significantly but she still has some tenderness over the wounds. 02/28/2015 - She has had one of two vascular workups done, and this coming Tuesday has another, at [facility]. She continues to be on steroid medications. She has significant sensitivity in her left lower extremity and has pain suggestive of neuropathic pain and I have asked her to address this with her primary care physician. 03/07/2015 -- The patient saw Dr. Lucky Cowboy for a consultation and he has had her arteries are okay but she has 2 incompetent veins on the left lower extremity and he is going to set her up for surgery. Official report is awaited. Addendum: Official reports are now available and on 03/04/2015. She was seen and lower extremity venous duplex exam was done. There was reflux present within the left greater saphenous vein below the knee and also the left small saphenous vein. Arterial duplex showed normal triphasic waveforms throughout the left lower extremity without any significant stenosis. Her ABIs were noncompressible bilaterally but a waveforms were normal and a  digital pressures were normal bilaterally consistent with no significant arterial insufficiency. He has recommended endovenous ablation of both the left small saphenous and the left great saphenous vein. This would still be scheduled later. 03/14/2015 -- she has heard back from the vascular office and has surgery scheduled for sometime in July. [name], [name] ([id number]) Her rheumatologist has decreased her prednisone dosage but she still on it. She has also had cataract surgery in her right eye recently this week. 03/20/2015 - No new complaints today. Pain improved. No fever or chills. Tolerating 2 layer compression. 04/14/2015 -- she was doing very well today she went off on vacation and now her edema has increased markedly the ulceration is bigger and her diabetes is not under control. 04/21/2015 -- I spoke to her PCP Dr. Candiss Norse and discussed the management which would include being seen by a general surgeon for debridement and taking multiple punch biopsies which would help in establishing the diagnosis of this is a vasculitis. She is agreeable about this and will set her up for the procedure with Dr. Tamala Julian at [facility]. She was seen by the surgeon Dr. Jamal Collin. 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 She is scheduled to have laser ablation done next week.Pt was sent here for possible biopsy to look for vasculitis. Feel it would be better to wait after laser ablation is completed- the ulcer may heal fully and biopsy may not be necessary 04/29/2015 -- she had the venous ablation done by Dr. Lucky Cowboy last Friday and we do not have any notes yet. She is doing fine otherwise. 05/06/2015 --Review of her recent vascular intervention shows that she was seen by Dr. Lucky Cowboy on 04/29/2015. The follow-up duplex which was done showed that both the great saphenous vein and the small  saphenous vein remained patent with reflux consistent  with an unsuccessful ablation. He has rescheduled her for another the endovenous ablation to be done in about 4 weekso time. 05/13/2015 -- he was seen by her surgeon Dr. Jamal Collin who asked her to continue with conservative therapy and he would speak to Dr. Lucky Cowboy about her management. Dr. Lucky Cowboy is going to schedule her surgery in the middle of August for a repeat endovenous ablation. Her pus culture from last week has grown : [place] her noted her sensitivity report but due to her multiple allergies I had tried clindamycin and she developed a rash with this too. She has been prescribed and anti-buttocks in the ER and is has it at home and she will let is know what she is going to be taking. 05/20/2015 -- she has developed a small spot on her right lower extremity but besides that it is not a full fledged ulceration. She did not get to see Dr. Lucky Cowboy last week and hopefully she will see him in the near future. 05/27/2015 -she is still awaiting her appointment with Dr.Dew and her vascular procedure is not scheduled until August 19. She will be seeing her PCP tomorrow and I have asked her to convey our discussion so that she is aware that debridement has not been done yet. 06/03/2015 -- was seen by her rheumatologist Dr. 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 He is lowering her prednisone to 12-1/2 mg for 1 month and then 10 mg per the next month. I will again make an attempt to speak to her PCP Dr. Candiss Norse and her surgeon Dr. 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 06/17/2015 -- Dr.Dew did her vascular procedure last week and a follow-up  venous ultrasound shows good resolution of the veins as per the patient's history. He is to see her back in 2 weeks. DAJUANA, PALEN ([id number]) Electronic Signature(s) Signed: 06/17/2015 11:45:06 AM By: [name], [name] Entered By: [name] on 06/17/2015 11:45:06 [name], [name] ([id number]) -------------------------------------------------------------------------------- Physical Exam Details Patient Name: [name], [name] 06/17/2015 11:30 Date of Service: AM Medical Record [id number] Number: Patient Account Number: [id number] [birth year] (69 y.o. Treating RN: [name] Date of Birth/Sex: Female) Other Clinician: Primary Care Physician: [facility], Delana Meyer Treating [name] Referring Physician: Glendon Axe Physician/Extender: Weeks in Treatment: 19 Constitutional . Pulse regular. Respirations normal and unlabored. Afebrile. . Eyes Nonicteric. Reactive to light. Ears, Nose, Mouth, and Throat Lips, teeth, and gums WNL.Marland Kitchen Moist mucosa without lesions . Neck supple and nontender. No palpable supraclavicular or cervical adenopathy. Normal sized without goiter. Respiratory WNL. No retractions.. Breath sounds WNL, No rubs, rales, rhonchi, or wheeze.. Cardiovascular Heart rhythm and rate regular, no murmur or gallop.. Pedal Pulses WNL. the edema of the left lower extremity persist but the wound itself looks rather clean.. Chest Breasts symmetical and no nipple discharge.. Breast tissue WNL, no masses, lumps, or tenderness.. Lymphatic No adneopathy. No adenopathy. No adenopathy. Musculoskeletal Adexa without tenderness or enlargement.. Digits and nails w/o clubbing, cyanosis, infection, petechiae, ischemia, or inflammatory conditions.. Integumentary (Hair, Skin) No suspicious lesions. No crepitus or fluctuance. No peri-wound warmth or erythema. No masses.Marland Kitchen Psychiatric Judgement and insight Intact.. No evidence of depression, anxiety, or agitation.. Notes The main  wound has minimal slough medially and this  is down to the subcutaneous tissue and will be sharply debrided with a forcep and scissors. Electronic Signature(s) Signed: 06/17/2015 11:45:53 AM By: [name], [name] Entered By: [name] on 06/17/2015 11:45:52 Yochim, JALAIYA OYSTER ([id number]) -------------------------------------------------------------------------------- Physician Orders Details Patient Name: [name], [name] 06/17/2015 11:30 Date of Service: AM Medical Record [id number] Number: Patient Account Number: [id number] 12-12-1945 (69 y.o. Treating RN: [name] Date of Birth/Sex: Female) Other Clinician: Primary Care Physician: [facility], Delana Meyer Treating [name] Referring Physician: Glendon Axe Physician/Extender: Suella [place] in Treatment: 57 Verbal / Phone Orders: Yes Clinician: [name] Read Back and Verified: Yes Diagnosis Coding Wound Cleansing Wound #2 Left,Distal Lower Leg o Clean wound with Normal Saline. Wound #5 Right,Posterior Lower Leg o Clean wound with Normal Saline. Anesthetic Wound #2 Left,Distal Lower Leg o Topical Lidocaine 4% cream applied to wound bed prior to debridement Wound #5 Right,Posterior Lower Leg o Topical Lidocaine 4% cream applied to wound bed prior to debridement Primary Wound Dressing Wound #2 Left,Distal Lower Leg o Santyl Ointment Wound #5 Right,Posterior Lower Leg o Other: - betadine paint Secondary Dressing Wound #2 Left,Distal Lower Leg o ABD and Kerlix/Conform Wound #5 Right,Posterior Lower Leg o Boardered Foam Dressing Dressing Change Frequency Wound #2 Left,Distal Lower Leg o Change dressing every day. Wound #5 Right,Posterior Lower Leg o Change dressing every day. [name], [name] ([id number]) Follow-up Appointments Wound #2 Left,Distal Lower Leg o Return Appointment in 1 week. Wound #5 Right,Posterior Lower Leg o Return Appointment in 1 week. Edema Control Wound #2 Left,Distal Lower  Leg o Support Garment 20-30 mm/Hg pressure to: Wound #5 Right,Posterior Lower Leg o Support Garment 20-30 mm/Hg pressure to: Additional Orders / Instructions Wound #2 Left,Distal Lower Leg o Increase protein intake. Wound #5 Right,Posterior Lower Leg o Increase protein intake. Electronic Signature(s) Signed: 06/17/2015 4:00:25 PM By: [name], [name] Signed: 06/17/2015 5:29:15 PM By: [name] RN, [name], [name], [name] Entered By: [name], RN, [name], Kim on 06/17/2015 11:42:16 Imbert, ALLISEN PIDGEON ([id number]) -------------------------------------------------------------------------------- Problem List Details Patient Name: KAMARYN, GRIMLEY 06/17/2015 11:30 Date of Service: AM Medical Record [id number] Number: Patient Account Number: [id number] February 03, 1946 (69 y.o. 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 06/17/2015 11:30 Date of  Service: AM Medical Record [id number] Number: Patient Account Number: [id number] 1946/08/31 (69 y.o. Treating RN: [name] Date of Birth/Sex: Female) Other Clinician: Primary Care Physician: [facility], Delana Meyer Treating [name] Referring Physician: Glendon Axe Physician/Extender: Weeks in Treatment: 19 Subjective Chief Complaint Information obtained from Patient Patient presents to the wound care center for a consult due non healing wound 69 year old patient comes with a history of having a ulcer on the left lower extremity for the past 4 weeks. she says she's  had swelling of both lower extremities for about a year after she started having prednisone. 02/07/2015 -- her vascular appointments obtained were in the first and third week of June. she is able to go to [place] and we will try and get her some earlier appointments. Other than that nothing else has changed in her management. History of Present Illness (HPI) 69 year old patient who is known to have diabetes mellitus type 2, chronic renal insufficiency, coronary artery disease, hypertension, hypercholesterolemia, temporal arteritis and inflammatory arthritiss also has a history of having a hysterectomy and some orthopedic related surgeries. The ulcer on the left lower extremity started off as a blister and then. Got progressively worse. She does not have any fever or chills and has not had any recent surgical intervention for this. Her last hemoglobin A1c was 10.1 in September 2015. She has been recently put on doxycycline by her PCP. She is now also allergic to doxycycline and was this was changed over to Keflex. 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 She does see a cardiologist and also takes a diuretic. 02/07/2015 her arterial and venous duplex studies to be done have dates been given as the first and  third week of June. This is at [facility]. We are going to try and get early appointments at [facility]. other than that nothing has changed in her management. 02/14/2015 -- we have been able to get her an appointment in [facility] on May 20 which is much earlier than her previous ones at [place]. She continues with her prednisone and her sugars are in the range of 150-200. 02/21/2015 We were able to get a vascular lab workup for her today and she is going to be there at 2:00 this afternoon. the swelling of her leg has gone down significantly but she still has some tenderness over the wounds. 02/28/2015 - She has had one of two vascular workups done, and this coming Tuesday has another, at [facility]. She continues to be on steroid medications. She has significant sensitivity in her left lower extremity and has pain suggestive of neuropathic pain and I have asked her to address this with her primary care physician. 03/07/2015 -- The patient saw Dr. Lucky Cowboy for a consultation and he has had her arteries are okay but she has 2 incompetent veins on the left lower extremity and he is going to set her up for surgery. Official report is [name], [name] ([id number]) awaited. Addendum: Official reports are now available and on 03/04/2015. She was seen and lower extremity venous duplex exam was done. There was reflux present within the left greater saphenous vein below the knee and also the left small saphenous vein. Arterial duplex showed normal triphasic waveforms throughout the left lower extremity without any significant stenosis. Her ABIs were noncompressible bilaterally but a waveforms were normal and a digital pressures were normal bilaterally consistent with no significant arterial insufficiency. He has recommended endovenous ablation of both the left small saphenous and the left great saphenous vein. This would still be scheduled later. 03/14/2015 -- she has heard  back from the vascular office and has surgery scheduled for sometime in July. Her rheumatologist has decreased her prednisone dosage but she still on it. She has also had cataract surgery in her right eye recently this week. 03/20/2015 - No new complaints today. Pain improved. No fever or chills. Tolerating  2 layer compression. 04/14/2015 -- she was doing very well today she went off on vacation and now her edema has increased markedly the ulceration is bigger and her diabetes is not under control. 04/21/2015 -- I spoke to her PCP Dr. Candiss Norse and discussed the management which would include being seen by a general surgeon for debridement and taking multiple punch biopsies which would help in establishing the diagnosis of this is a vasculitis. She is agreeable about this and will set her up for the procedure with Dr. Tamala Julian at [facility]. She was seen by the surgeon Dr. Jamal Collin. 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 She is scheduled to have laser ablation done next week.Pt was sent here for possible biopsy to look for vasculitis. Feel it would be better to wait after laser ablation is completed- the ulcer may heal fully and biopsy may not be necessary 04/29/2015 -- she had the venous ablation done by Dr. Lucky Cowboy last Friday and we do not have any notes yet. She is doing fine otherwise. 05/06/2015 --Review of her recent vascular intervention shows that she was seen by Dr. Lucky Cowboy on 04/29/2015. The follow-up duplex which was done showed that both the great saphenous vein and the small saphenous vein remained patent with reflux consistent with an unsuccessful ablation. He has rescheduled her for another the endovenous ablation to be done in about 4 weeks time. 05/13/2015 -- he was seen by her surgeon Dr. Jamal Collin who asked her to continue with conservative therapy and he would speak to  Dr. Lucky Cowboy about her management. Dr. Lucky Cowboy is going to schedule her surgery in the middle of August for a repeat endovenous ablation. Her pus culture from last week has grown : [place] her noted her sensitivity report but due to her multiple allergies I had tried clindamycin and she developed a rash with this too. She has been prescribed and anti-buttocks in the ER and is has it at home and she will let is know what she is going to be taking. 05/20/2015 -- she has developed a small spot on her right lower extremity but besides that it is not a full fledged ulceration. She did not get to see Dr. Lucky Cowboy last week and hopefully she will see him in the near future. 05/27/2015 -she is still awaiting her appointment with Dr.Dew and her vascular procedure is not scheduled until August 19. She will be seeing her PCP tomorrow and I have asked her to convey our discussion so that she is aware that debridement has not been done yet. [name], [name] ([id number]) 06/03/2015 -- was seen by her rheumatologist Dr. 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 He is lowering her prednisone to 12-1/2 mg for 1 month and then 10 mg per the next month. I will again make an attempt to speak to her PCP Dr. Candiss Norse and her surgeon Dr. 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 06/17/2015 -- Dr.Dew did her vascular procedure last week and a follow-up venous ultrasound shows good resolution of the veins as per the patient's history. He is to see her back in 2 weeks. Objective Constitutional Pulse regular. Respirations normal and  unlabored. Afebrile. Vitals Time Taken: 11:25 AM, Height: 65 in, Weight: 248 lbs, BMI: 41.3,  Temperature: 97.6 F, Pulse: 68 bpm, Respiratory Rate: 18 breaths/min, Blood Pressure: 146/70 mmHg. Eyes Nonicteric. Reactive to light. Ears, Nose, Mouth, and Throat Lips, teeth, and gums WNL.Marland Kitchen Moist mucosa without lesions . Neck supple and nontender. No palpable supraclavicular or cervical adenopathy. Normal sized without goiter. Respiratory WNL. No retractions.. Breath sounds WNL, No rubs, rales, rhonchi, or wheeze.. Cardiovascular Heart rhythm and rate regular, no murmur or gallop.. Pedal Pulses WNL. the edema of the left lower extremity persist but the wound itself looks rather clean.. Chest Breasts symmetical and no nipple discharge.. Breast tissue WNL, no masses, lumps, or tenderness.. Lymphatic No adneopathy. No adenopathy. No adenopathy. Musculoskeletal Adexa without tenderness or enlargement.. Digits and nails w/o clubbing, cyanosis, infection, petechiae, ischemia, or inflammatory conditions.Marland Kitchen COLETTE, DICAMILLO ([id number]) Psychiatric Judgement and insight Intact.. No evidence of depression, anxiety, or agitation.. General Notes: The main wound has minimal slough medially and this is down to the subcutaneous tissue and will be sharply debrided with a forcep and scissors. Integumentary (Hair, Skin) No suspicious lesions. No crepitus or fluctuance. No peri-wound warmth or erythema. No masses.. Wound #2 status is Open. Original cause of wound was Blister. The wound is located on the Left,Distal Lower Leg. The wound measures 5.5cm length x 5.2cm width x 0.5cm depth; 22.462cm^2 area and 11.231cm^3 volume. The wound is limited to skin breakdown. There is no tunneling or undermining noted. There is a medium amount of purulent drainage noted. The wound margin is distinct with the outline attached to the wound base. There is small (1-33%) red, pink granulation within the wound bed. There is a large (67-100%) amount of necrotic tissue within the wound bed including Adherent Slough. The  periwound skin appearance exhibited: Localized Edema, Moist, Hemosiderin Staining. The periwound skin appearance did not exhibit: Callus, Crepitus, Excoriation, Fluctuance, Friable, Induration, Rash, Scarring, Dry/Scaly, Maceration, Atrophie Blanche, Cyanosis, Ecchymosis, Mottled, Pallor, Rubor, Erythema. Periwound temperature was noted as No Abnormality. The periwound has tenderness on palpation. Wound #5 status is Open. Original cause of wound was Gradually Appeared. The wound is located on the Right,Posterior Lower Leg. The wound measures 0.4cm length x 0.7cm width x 0.1cm depth; 0.22cm^2 area and 0.022cm^3 volume. The wound is limited to skin breakdown. There is a none present amount of drainage noted. The wound margin is flat and intact. There is no granulation within the wound bed. There is a large (67-100%) amount of necrotic tissue within the wound bed including Eschar. The periwound skin appearance exhibited: Localized Edema, Dry/Scaly, Hemosiderin Staining. The periwound skin appearance did not exhibit: Callus, Crepitus, Excoriation, Fluctuance, Friable, Induration, Rash, Scarring, Maceration, Moist, Atrophie Blanche, Cyanosis, Ecchymosis, Mottled, Pallor, Rubor, Erythema. Periwound temperature was noted as No Abnormality. The periwound has tenderness on palpation. 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 ([id number]) The patient has completed a vascular procedure on the left lower extremity and we will will continue with application of Santyl and moderate compression  with a 20-30 mm support stocking. She will come back and see me next week. Procedures  Wound #2 Wound #2 is a Venous Leg Ulcer located on the Left,Distal Lower Leg . There was a Skin/Subcutaneous Tissue Debridement ([id number]) debridement with total area of 15 sq cm performed by Selenne Coggin, Jackson Latino., MD. with the following instrument(s): Forceps and Scissors to remove Viable and Non-Viable tissue/material including Exudate, Fibrin/Slough, and Subcutaneous after achieving pain control using Other (lidocaine 4%). A time out was conducted prior to the start of the procedure. There was no bleeding. The procedure was tolerated well with a pain level of 0 throughout and a pain level of 0 following the procedure. Post Debridement Measurements: 5.5cm length x 5.2cm width x 0.5cm depth; 11.231cm^3 volume. Post procedure Diagnosis Wound #2: Same as Pre-Procedure Plan Wound Cleansing: Wound #2 Left,Distal Lower Leg: Clean wound with Normal Saline. Wound #5 Right,Posterior Lower Leg: Clean wound with Normal Saline. Anesthetic: Wound #2 Left,Distal Lower Leg: Topical Lidocaine 4% cream applied to wound bed prior to debridement Wound #5 Right,Posterior Lower Leg: Topical Lidocaine 4% cream applied to wound bed prior to debridement Primary Wound Dressing: Wound #2 Left,Distal Lower Leg: Santyl Ointment Wound #5 Right,Posterior Lower Leg: Other: - betadine paint Secondary Dressing: Wound #2 Left,Distal Lower Leg: ABD and Kerlix/Conform Wound #5 Right,Posterior Lower Leg: Boardered Foam Dressing Dressing Change Frequency: Wound #2 Left,Distal Lower Leg: Bohl, Aryel D. ([id number]) Change dressing every day. Wound #5 Right,Posterior Lower Leg: Change dressing every day. Follow-up Appointments: Wound #2 Left,Distal Lower Leg: Return Appointment in 1 week. Wound #5 Right,Posterior Lower Leg: Return Appointment in 1 week. Edema Control: Wound #2 Left,Distal Lower Leg: Support Garment 20-30  mm/Hg pressure to: Wound #5 Right,Posterior Lower Leg: Support Garment 20-30 mm/Hg pressure to: Additional Orders / Instructions: Wound #2 Left,Distal Lower Leg: Increase protein intake. Wound #5 Right,Posterior Lower Leg: Increase protein intake. The patient has completed a vascular procedure on the left lower extremity and we will will continue with application of Santyl and moderate compression with a 20-30 mm support stocking. She will come back and see me next week. Electronic Signature(s) Signed: 06/17/2015 11:46:58 AM By: [name], [name] Entered By: [name] on 06/17/2015 11:46:58 Thalman, [place] D. ([id number]) -------------------------------------------------------------------------------- SuperBill Details Patient Name: ANTHONETTE, LESAGE D. Date of Service: 06/17/2015 Medical Record Number: [id number] Patient Account Number: [id number] Date of Birth/Sex: Nov 19, 1945 (68 y.o. 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

## 2015-06-24 ENCOUNTER — Encounter: Payer: Self-pay | Admitting: General Surgery

## 2015-06-24 ENCOUNTER — Encounter (HOSPITAL_BASED_OUTPATIENT_CLINIC_OR_DEPARTMENT_OTHER): Payer: PPO | Admitting: General Surgery

## 2015-06-24 DIAGNOSIS — I83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: Secondary | ICD-10-CM | POA: Diagnosis not present

## 2015-06-24 DIAGNOSIS — L97322 Non-pressure chronic ulcer of left ankle with fat layer exposed: Secondary | ICD-10-CM | POA: Diagnosis not present

## 2015-06-24 DIAGNOSIS — L97229 Non-pressure chronic ulcer of left calf with unspecified severity: Principal | ICD-10-CM

## 2015-06-24 NOTE — Progress Notes (Signed)
Kristin, [name] ([id number]) Visit Report for 06/24/2015 Chief Complaint Document Details Patient Name: [name], [name] Date of Service: 06/24/2015 8:45 AM Medical Record Patient Account Number: [id number] Number: [name], RN, [name], Treating RN: 1946-05-16 (69 y.o. [name] Date of Birth/Sex: Female) Other Clinician: Primary Care Physician: [facility], Hyman Hopes, Cliford Sequeira Referring Physician: Glendon Axe Physician/Extender: Weeks in Treatment: 20 Information Obtained from: Patient Chief Complaint Patient presents to the wound care center for a consult due non healing wound 69 year old patient comes with a history of having a ulcer on the left lower extremity for the past 4 weeks. she says she's had swelling of both lower extremities for about a year after she started having prednisone. 02/07/2015 -- her vascular appointments obtained were in the first and third week of June. she is able to go to [place] and we will try and get her some earlier appointments. Other than that nothing else has changed in her management. Electronic Signature(s) Signed: 06/24/2015 9:27:38 AM By: Judene Companion MD Previous Signature: 06/24/2015 9:23:15 AM Version By: Judene Companion MD Entered By: Judene Companion on 06/24/2015 09:27:38 [name], Kristin ARMES ([id number]) -------------------------------------------------------------------------------- Debridement Details Patient Name: Kristin, [name] 06/24/2015 8:45 Date of Service: AM Medical Record [id number] Number: Patient Account Number: [id number] 01-06-1946 (69 y.o. Treating RN: [name] Date of Birth/Sex: Female) Other Clinician: Primary Care Physician: [facility], Hyman Hopes, Bernadette Armijo Referring Physician: Glendon Axe Physician/Extender: Weeks in Treatment: 20 Debridement Performed for Wound #2 Left,Distal Lower Leg Assessment: Performed By: Physician Judene Companion, MD Debridement: Debridement Pre-procedure Yes Verification/Time  Out Taken: Start Time: 09:00 Pain Control: Other : lidocaine 4% Level: Skin/Subcutaneous Tissue Total Area Debrided (L x 6.3 (cm) x 5.5 (cm) = 34.65 (cm) W): Tissue and other Viable, Non-Viable, Exudate, Fibrin/Slough, Skin, Subcutaneous material debrided: Instrument: Curette Bleeding: Moderate Hemostasis Achieved: Pressure End Time: 09:07 Procedural Pain: 2 Post Procedural Pain: 2 Response to Treatment: Procedure was tolerated well Post Debridement Measurements of Total Wound Length: (cm) 6.3 Width: (cm) 5.5 Depth: (cm) 0.6 Volume: (cm) 16.328 Post Procedure Diagnosis Same as Pre-procedure Electronic Signature(s) Signed: 06/24/2015 12:42:01 PM By: [name], RN, [name], [name], [name] Entered By: [name], RN, [name], Kim on 06/24/2015 09:08:13 [name], [name] ([id number]) -------------------------------------------------------------------------------- HPI Details Patient Name: [name], [name] Date of Service: 06/24/2015 8:45 AM Medical Record Patient Account Number: [id number] Number: [name], RN, [name], Treating RN: [birth year] (69 y.o. [name] Date of Birth/Sex: Female) Other Clinician: Primary Care Physician: Danny Lawless, Annetta Deiss Referring Physician: Glendon Axe Physician/Extender: Weeks in Treatment: 20 History of Present Illness HPI Description: 69 year old patient who is known to have diabetes mellitus type 2, chronic renal insufficiency, coronary artery disease, hypertension, hypercholesterolemia, temporal arteritis and inflammatory arthritiss also has a history of having a hysterectomy and some orthopedic related surgeries. The ulcer on the left lower extremity started off as a blister and then. Got progressively worse. She does not have any fever or chills and has not had any recent surgical intervention for this. Her last hemoglobin A1c was 10.1 in September 2015. She has been recently put on doxycycline by her PCP. She is now also allergic to  doxycycline and was this was changed over to Keflex. 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 She does see a cardiologist and also takes a diuretic. 02/07/2015 her arterial and venous duplex studies to be done have dates been given as  the first and third week of June. This is at [facility]. We are going to try and get early appointments at [facility]. other than that nothing has changed in her management. 02/14/2015 -- we have been able to get her an appointment in [facility] on May 20 which is much earlier than her previous ones at [place]. She continues with her prednisone and her sugars are in the range of 150-200. 02/21/2015 We were able to get a vascular lab workup for her today and she is going to be there at 2:00 this afternoon. the swelling of her leg has gone down significantly but she still has some tenderness over the wounds. 02/28/2015 - She has had one of two vascular workups done, and this coming Tuesday has another, at [facility]. She continues to be on steroid medications. She has significant sensitivity in her left lower extremity and has pain suggestive of neuropathic pain and I have asked her to address this with her primary care physician. 03/07/2015 -- The patient saw Dr. Lucky Cowboy for a consultation and he has had her arteries are okay but she has 2 incompetent veins on the left lower extremity and he is going to set her up for surgery. Official report is awaited. Addendum: Official reports are now available and on 03/04/2015. She was seen and lower extremity venous duplex exam was done. There was reflux present within the left greater saphenous vein below the knee and also the left small saphenous vein. Arterial duplex showed normal triphasic waveforms throughout the left lower extremity without any significant stenosis. Her ABIs were  noncompressible bilaterally but a waveforms were normal and a digital pressures were normal bilaterally consistent with no significant arterial insufficiency. He has recommended endovenous ablation of both the left small saphenous and the left great saphenous vein. This would still be scheduled later. 03/14/2015 -- she has heard back from the vascular office and has surgery scheduled for sometime in July. [name], [name] ([id number]) Her rheumatologist has decreased her prednisone dosage but she still on it. She has also had cataract surgery in her right eye recently this week. 03/20/2015 - No new complaints today. Pain improved. No fever or chills. Tolerating 2 layer compression. 04/14/2015 -- she was doing very well today she went off on vacation and now her edema has increased markedly the ulceration is bigger and her diabetes is not under control. 04/21/2015 -- I spoke to her PCP Dr. Candiss Norse and discussed the management which would include being seen by a general surgeon for debridement and taking multiple punch biopsies which would help in establishing the diagnosis of this is a vasculitis. She is agreeable about this and will set her up for the procedure with Dr. Tamala Julian at [facility]. She was seen by the surgeon Dr. Jamal Collin. 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 She is scheduled to have laser ablation done next week.Pt was sent here for possible biopsy to look for vasculitis. Feel it would be better to wait after laser ablation is completed- the ulcer may heal fully and biopsy may not be necessary 04/29/2015 -- she had the venous ablation done by Dr. Lucky Cowboy last Friday and we do not have any notes yet. She is doing fine otherwise. 05/06/2015 --Review of her recent vascular intervention shows that she was seen by Dr. Lucky Cowboy on 04/29/2015. The follow-up duplex which was done  showed that both the great  saphenous vein and the small saphenous vein remained patent with reflux consistent with an unsuccessful ablation. He has rescheduled her for another the endovenous ablation to be done in about 4 weekso time. 05/13/2015 -- he was seen by her surgeon Dr. Jamal Collin who asked her to continue with conservative therapy and he would speak to Dr. Lucky Cowboy about her management. Dr. Lucky Cowboy is going to schedule her surgery in the middle of August for a repeat endovenous ablation. Her pus culture from last week has grown : [place] her noted her sensitivity report but due to her multiple allergies I had tried clindamycin and she developed a rash with this too. She has been prescribed and anti-buttocks in the ER and is has it at home and she will let is know what she is going to be taking. 05/20/2015 -- she has developed a small spot on her right lower extremity but besides that it is not a full fledged ulceration. She did not get to see Dr. Lucky Cowboy last week and hopefully she will see him in the near future. 05/27/2015 -she is still awaiting her appointment with Dr.Dew and her vascular procedure is not scheduled until August 19. She will be seeing her PCP tomorrow and I have asked her to convey our discussion so that she is aware that debridement has not been done yet. 06/03/2015 -- was seen by her rheumatologist Dr. 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 He is lowering her prednisone to 12-1/2 mg for 1 month and then 10 mg per the next month. I will again make an attempt to speak to her PCP Dr. Candiss Norse and her surgeon Dr. 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 06/17/2015 --  Dr.Dew did her vascular procedure last week and a follow-up venous ultrasound shows good resolution of the veins as per the patient's history. He is to see her back in 2 weeks. [name], [name] ([id number]) Electronic Signature(s) Signed: 06/24/2015 9:27:49 AM By: Judene Companion MD Previous Signature: 06/24/2015 9:23:25 AM Version By: Judene Companion MD Entered By: Judene Companion on 06/24/2015 09:27:49 [name], [name] ([id number]) -------------------------------------------------------------------------------- Physical Exam Details Patient Name: NERIYAH, CERCONE D. Date of Service: 06/24/2015 8:45 AM Medical Record Patient Account Number: [id number] Number: [name], RN, [name], Treating RN: 1946/03/17 (69 y.o. [name] Date of Birth/Sex: Female) Other Clinician: Primary Care Physician: Danny Lawless, Theodus Ran Referring Physician: Glendon Axe Physician/Extender: Weeks in Treatment: 20 Electronic Signature(s) Signed: 06/24/2015 9:27:58 AM By: Judene Companion MD Previous Signature: 06/24/2015 9:23:33 AM Version By: Judene Companion MD Entered By: Judene Companion on 06/24/2015 09:27:57 [name], [name] ([id number]) -------------------------------------------------------------------------------- Physician Orders Details Patient Name: [name], [name] 06/24/2015 8:45 Date of Service: AM Medical Record [id number] Number: Patient Account Number: [id number] Jun 03, 1946 (69 y.o. Treating RN: [name] Date of Birth/Sex: Female) Other Clinician: Primary Care Physician: [facility], Hyman Hopes, Zahraa Bhargava Referring Physician: Glendon Axe Physician/Extender: Weeks in Treatment: 20 Verbal / Phone Orders: Yes Clinician: [name] Read Back and Verified: Yes Diagnosis Coding Wound Cleansing Wound #2 Left,Distal Lower Leg o Clean wound with Normal Saline. Wound #5 Right,Posterior Lower Leg o Clean wound with Normal Saline. Anesthetic Wound #2 Left,Distal Lower Leg o Topical  Lidocaine 4% cream applied to wound bed prior to debridement  Wound #5 Right,Posterior Lower Leg o Topical Lidocaine 4% cream applied to wound bed prior to debridement Primary Wound Dressing Wound #2 Left,Distal Lower Leg o Santyl Ointment Wound #5 Right,Posterior Lower Leg o Other: - betadine paint Secondary Dressing Wound #2 Left,Distal Lower Leg o ABD and Kerlix/Conform Wound #5 Right,Posterior Lower Leg o Boardered Foam Dressing Dressing Change Frequency Wound #2 Left,Distal Lower Leg o Change dressing every day. Wound #5 Right,Posterior Lower Leg o Change dressing every day. [name], [name] ([id number]) Follow-up Appointments Wound #2 Left,Distal Lower Leg o Return Appointment in 1 week. Wound #5 Right,Posterior Lower Leg o Return Appointment in 1 week. Edema Control Wound #2 Left,Distal Lower Leg o Support Garment 20-30 mm/Hg pressure to: Wound #5 Right,Posterior Lower Leg o Support Garment 20-30 mm/Hg pressure to: Additional Orders / Instructions Wound #2 Left,Distal Lower Leg o Increase protein intake. Wound #5 Right,Posterior Lower Leg o Increase protein intake. Electronic Signature(s) Signed: 06/24/2015 12:42:01 PM By: [name], RN, [name], [name], [name] Entered By: [name], RN, [name], Kim on 06/24/2015 09:06:26 [name], [name] ([id number]) -------------------------------------------------------------------------------- Problem List Details Patient Name: HORTENSE, CANTRALL D. Date of Service: 06/24/2015 8:45 AM Medical Record Patient Account Number: [id number] Number: [name], RN, [name], Treating RN: [birth year] (69 y.o. 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 ([id number]) -------------------------------------------------------------------------------- Progress Note Details Patient Name: TELENA, PEYSER D. Date of Service: 06/24/2015 8:45 AM Medical Record Patient Account Number: [id number] Number: [name], RN, [name], Treating RN: 1945/11/02 (69 y.o. [name] Date of Birth/Sex: Female) Other Clinician: Primary Care Physician: [facility], Hyman Hopes, Jeydi Klingel Referring Physician: Glendon Axe Physician/Extender: Weeks in Treatment: 20 Subjective Chief Complaint Information obtained from Patient Patient presents to the wound care center for a consult due non healing wound 69 year old patient comes with a history of having a ulcer on the left lower extremity for the past 4 weeks. she says she's had swelling of both lower extremities for about a year after she started having prednisone. 02/07/2015 -- her vascular appointments obtained were in the first and third week of June. she is able to go to [place] and we will try and get her some earlier appointments. Other than that nothing else has changed in her management. History of Present Illness (HPI) 69 year old patient who is known to have diabetes mellitus type 2, chronic renal insufficiency, coronary artery disease,  hypertension, hypercholesterolemia, temporal arteritis and inflammatory arthritiss also has a history of having a hysterectomy and some orthopedic related surgeries. The ulcer on the left lower extremity started off as a blister and then. Got progressively worse. She does not have any fever or chills and has not had any recent surgical intervention for this. Her last hemoglobin A1c was 10.1 in September 2015. She  has been recently put on doxycycline by her PCP. She is now also allergic to doxycycline and was this was changed over to Keflex. 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 She does see a cardiologist and also takes a diuretic. 02/07/2015 her arterial and venous duplex studies to be done have dates been given as the first and third week of June. This is at [facility]. We are going to try and get early appointments at [facility]. other than that nothing has changed in her management. 02/14/2015 -- we have been able to get her an appointment in [facility] on May 20 which is much earlier than her previous ones at [place]. She continues with her prednisone and her sugars are in the range of 150-200. 02/21/2015 We were able to get a vascular lab workup for her today and she is going to be there at 2:00 this afternoon. the swelling of her leg has gone down significantly but she still has some tenderness over the wounds. 02/28/2015 - She has had one of two vascular workups done, and this coming Tuesday has another, at [facility]. She continues to be on steroid medications. She has significant sensitivity in her left lower extremity and has pain suggestive of neuropathic pain and I have asked her to address this with her primary care physician. 03/07/2015 -- The patient saw Dr. Lucky Cowboy for a consultation and he has had her arteries are okay but she has 2  incompetent veins on the left lower extremity and he is going to set her up for surgery. Official report is ANNICK, DIMAIO ([id number]) awaited. Addendum: Official reports are now available and on 03/04/2015. She was seen and lower extremity venous duplex exam was done. There was reflux present within the left greater saphenous vein below the knee and also the left small saphenous vein. Arterial duplex showed normal triphasic waveforms throughout the left lower extremity without any significant stenosis. Her ABIs were noncompressible bilaterally but a waveforms were normal and a digital pressures were normal bilaterally consistent with no significant arterial insufficiency. He has recommended endovenous ablation of both the left small saphenous and the left great saphenous vein. This would still be scheduled later. 03/14/2015 -- she has heard back from the vascular office and has surgery scheduled for sometime in July. Her rheumatologist has decreased her prednisone dosage but she still on it. She has also had cataract surgery in her right eye recently this week. 03/20/2015 - No new complaints today. Pain improved. No fever or chills. Tolerating 2 layer compression. 04/14/2015 -- she was doing very well today she went off on vacation and now her edema has increased markedly the ulceration is bigger and her diabetes is not under control. 04/21/2015 -- I spoke to her PCP Dr. Candiss Norse and discussed the management which would include being seen by a general surgeon for debridement and taking multiple punch biopsies which would help in establishing the diagnosis of this is a vasculitis. She is agreeable about this and will set her up for the procedure with Dr. Tamala Julian at [facility]. She was seen by the surgeon Dr. Jamal Collin. 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 She is scheduled to  have laser ablation done next week.Pt was sent here for possible biopsy to look for vasculitis.  Feel it would be better to wait after laser ablation is completed- the ulcer may heal fully and biopsy may not be necessary 04/29/2015 -- she had the venous ablation done by Dr. Lucky Cowboy last Friday and we do not have any notes yet. She is doing fine otherwise. 05/06/2015 --Review of her recent vascular intervention shows that she was seen by Dr. Lucky Cowboy on 04/29/2015. The follow-up duplex which was done showed that both the great saphenous vein and the small saphenous vein remained patent with reflux consistent with an unsuccessful ablation. He has rescheduled her for another the endovenous ablation to be done in about 4 weeks time. 05/13/2015 -- he was seen by her surgeon Dr. Jamal Collin who asked her to continue with conservative therapy and he would speak to Dr. Lucky Cowboy about her management. Dr. Lucky Cowboy is going to schedule her surgery in the middle of August for a repeat endovenous ablation. Her pus culture from last week has grown : [place] her noted her sensitivity report but due to her multiple allergies I had tried clindamycin and she developed a rash with this too. She has been prescribed and anti-buttocks in the ER and is has it at home and she will let is know what she is going to be taking. 05/20/2015 -- she has developed a small spot on her right lower extremity but besides that it is not a full fledged ulceration. She did not get to see Dr. Lucky Cowboy last week and hopefully she will see him in the near future. 05/27/2015 -she is still awaiting her appointment with Dr.Dew and her vascular procedure is not scheduled until August 19. She will be seeing her PCP tomorrow and I have asked her to convey our discussion so that she is aware that debridement has not been done yet. [name], [name] ([id number]) 06/03/2015 --  was seen by her rheumatologist Dr. 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 He is lowering her prednisone to 12-1/2 mg for 1 month and then 10 mg per the next month. I will again make an attempt to speak to her PCP Dr. Candiss Norse and her surgeon Dr. 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 06/17/2015 -- Dr.Dew did her vascular procedure last week and a follow-up venous ultrasound shows good resolution of the veins as per the patient's history. He is to see her back in 2 weeks. Objective Constitutional Vitals Time Taken: 8:49 AM, Height: 65 in, Weight: 248 lbs, BMI: 41.3, Temperature: 97.7 F, Pulse: 68 bpm, Respiratory Rate: 18 breaths/min, Blood Pressure: 138/56 mmHg. Integumentary (Hair, Skin) Wound #2 status is Open. Original cause of wound was Blister. The wound is located on the Left,Distal Lower Leg. The wound measures 6.3cm length x 5.5cm width x 0.5cm depth; 27.214cm^2 area and 13.607cm^3 volume. The wound is limited to skin breakdown. There is no tunneling or undermining noted. There is a medium amount of purulent drainage noted. The wound margin is distinct with the outline attached to the wound base. There is medium (34-66%) red, pink granulation within the wound bed. There is a medium (34-66%) amount of necrotic tissue within the wound bed including Adherent Slough. The periwound skin appearance exhibited: Localized Edema, Moist, Hemosiderin Staining. The periwound skin appearance did not exhibit: Callus, Crepitus, Excoriation,  Fluctuance, Friable, Induration, Rash, Scarring, Dry/Scaly, Maceration, Atrophie Blanche, Cyanosis, Ecchymosis, Mottled, Pallor, Rubor, Erythema. Periwound temperature was noted as No Abnormality. The periwound has tenderness on  palpation. Wound #5 status is Open. Original cause of wound was Gradually Appeared. The wound is located on the Right,Posterior Lower Leg. The wound measures 0.1cm length x 0.1cm width x 0.1cm depth; 0.008cm^2 area and 0.001cm^3 volume. The wound is limited to skin breakdown. There is no tunneling or undermining noted. There is a none present amount of drainage noted. The wound margin is flat and intact. There is no granulation within the wound bed. There is a large (67-100%) amount of necrotic tissue within the wound bed including Eschar. The periwound skin appearance exhibited: Localized Edema, Dry/Scaly, Hemosiderin Staining. The periwound skin appearance did not exhibit: Callus, Crepitus, Excoriation, Fluctuance, Friable, Induration, Rash, Scarring, Maceration, Moist, Atrophie Blanche, Cyanosis, Ecchymosis, Mottled, Pallor, Rubor, Erythema. Periwound temperature was noted as No Abnormality. The periwound has tenderness on palpation. 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 There was a Skin/Subcutaneous Tissue Debridement ([id number]) debridement with total area of 34.65 sq cm performed by Judene Companion, MD. with the following instrument(s): Curette to remove Viable and Non-Viable tissue/material including Exudate, Fibrin/Slough, Skin, and Subcutaneous after achieving pain control using Other (lidocaine 4%). A time out was conducted prior to the start of the procedure. A  Moderate amount of bleeding was controlled with Pressure. The procedure was tolerated well with a pain level of 2 throughout and a pain level of 2 following the procedure. Post Debridement Measurements: 6.3cm length x 5.5cm width x 0.6cm depth; 16.328cm^3 volume. Post procedure Diagnosis Wound #2: Same as Pre-Procedure Plan Wound Cleansing: Wound #2 Left,Distal Lower Leg: Clean wound with Normal Saline. Wound #5 Right,Posterior Lower Leg: Clean wound with Normal Saline. Anesthetic: Wound #2 Left,Distal Lower Leg: Topical Lidocaine 4% cream applied to wound bed prior to debridement Wound #5 Right,Posterior Lower Leg: Topical Lidocaine 4% cream applied to wound bed prior to debridement Primary Wound Dressing: Wound #2 Left,Distal Lower Leg: Rideaux, Chasya D. ([id number]) Santyl Ointment Wound #5 Right,Posterior Lower Leg: Other: - betadine paint Secondary Dressing: Wound #2 Left,Distal Lower Leg: ABD and Kerlix/Conform Wound #5 Right,Posterior Lower Leg: Boardered Foam Dressing Dressing Change Frequency: Wound #2 Left,Distal Lower Leg: Change dressing every day. Wound #5 Right,Posterior Lower Leg: Change dressing every day. Follow-up Appointments: Wound #2 Left,Distal Lower Leg: Return Appointment in 1 week. Wound #5 Right,Posterior Lower Leg: Return Appointment in 1 week. Edema Control: Wound #2 Left,Distal Lower Leg: Support Garment 20-30 mm/Hg pressure to: Wound #5 Right,Posterior Lower Leg: Support Garment 20-30 mm/Hg pressure to: Additional Orders / Instructions: Wound #2 Left,Distal Lower Leg: Increase protein intake. Wound #5 Right,Posterior Lower Leg: Increase protein intake. Follow-Up Appointments: A Patient Clinical Summary of Care was provided to LM Deep venous ulcer left leg with necrotic slough. Debrided with curette. Continue treating with Environmental health practitioner) Signed: 06/24/2015 9:28:11 AM By: Judene Companion MD Previous Signature: 06/24/2015 9:25:51  AM Version By: Judene Companion MD  Entered By: Judene Companion on 06/24/2015 09:28:11 Bolinger, Chelse D. ([id number]) -------------------------------------------------------------------------------- SuperBill Details Patient Name: [name] Date of Service: 06/24/2015 Medical Record Patient Account Number: [id number] Number: [name], RN, [name], Treating RN: 24-Jan-1946 (69 y.o. 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 Facility Procedures CPT4: Description Modifier Quantity Code 97741423 11042 - DEB SUBQ TISSUE 20 SQ CM/< 1 ICD-10 Description Diagnosis 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 CPT4: 95320233 11045 - DEB SUBQ TISS EA ADDL 20CM 1 ICD-10 Description Diagnosis I83.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

## 2015-06-24 NOTE — Progress Notes (Addendum)
Kristin, [name] ([id number]) Visit Report for 06/24/2015 Arrival Information Details Patient Name: [name], [name] Date of Service: 06/24/2015 8:45 AM Medical Record Number: [id number] Patient Account Number: [id number] Date of Birth/Sex: 1946/01/08 (69 y.o. Female) Treating RN: [name], RN, [name], [name] Primary Care Physician: Glendon Axe Other Clinician: Referring Physician: Glendon Axe Treating Physician/Extender: Benjaman Pott in Treatment: 20 Visit Information History Since Last Visit Any new allergies or adverse reactions: No Patient Arrived: [name] Had a fall or experienced change in No Arrival Time: 08:45 activities of daily living that may affect Accompanied By: self risk of falls: Transfer Assistance: None Signs or symptoms of abuse/neglect since last No Patient Identification Verified: Yes visito Secondary Verification Process Yes Hospitalized since last visit: No Completed: Has Dressing in Place as Prescribed: Yes Patient Has Alerts: Yes Pain Present Now: No Patient Alerts: Patient on Blood Thinner Electronic Signature(s) Signed: 06/24/2015 8:48:11 AM By: Regan Lemming [name], RN Entered By: Regan Lemming on 06/24/2015 08:48:11 Montejano, Jhoanna D. ([id number]) -------------------------------------------------------------------------------- Complex / Palliative Patient Assessment Details Patient Name: Stith, Biddie D. Date of Service: 06/24/2015 8:45 AM Medical Record Number: [id number] Patient Account Number: [id number] Date of Birth/Sex: 04-Mar-1946 (69 y.o. Female) Treating RN: [name] Primary Care Physician: Glendon Axe Other Clinician: Referring Physician: Glendon Axe Treating Physician/Extender: Benjaman Pott in Treatment: 20 Palliative Management Criteria Complex Wound Management Criteria Patient has remarkable or complex co-morbidities requiring medications or treatments that extend wound healing times. Examples: o Diabetes mellitus with  chronic renal failure or end stage renal disease requiring dialysis o Advanced or poorly controlled rheumatoid arthritis o Diabetes mellitus and end stage chronic obstructive pulmonary disease o Active cancer with current chemo- or radiation therapy Daily high doses of Predisone Care Approach Wound Care Plan: Complex Wound Management Electronic Signature(s) Signed: 06/24/2015 12:42:01 PM By: [name], RN, [name], [name], [name] Entered By: [name], RN, [name], Kim on 06/24/2015 10:44:34 Schlemmer, Tia Masker ([id number]) -------------------------------------------------------------------------------- Encounter Discharge Information Details Patient Name: Kristin, [name] Date of Service: 06/24/2015 8:45 AM Medical Record Number: [id number] Patient Account Number: [id number] Date of Birth/Sex: 15-May-1946 (69 y.o. Female) Treating RN: Baruch Gouty, RN, [name], [name] Primary Care Physician: Glendon Axe Other Clinician: Referring Physician: Glendon Axe Treating Physician/Extender: Benjaman Pott in Treatment: 20 Encounter Discharge Information Items Discharge Pain Level: 0 Discharge Condition: Stable Ambulatory Status: Cane Discharge Destination: Home Transportation: Private Auto Accompanied By: self Schedule Follow-up Appointment: No Medication Reconciliation completed No and provided to Patient/Care Haleema Vanderheyden: Provided on Clinical Summary of Care: 06/24/2015 Form Type Recipient Paper Patient LM Electronic Signature(s) Signed: 06/24/2015 9:29:34 AM By: Judene Companion MD Previous Signature: 06/24/2015 9:14:24 AM Version By: Ruthine Dose Previous Signature: 06/24/2015 9:13:32 AM Version By: Regan Lemming [name], RN Entered By: Judene Companion on 06/24/2015 09:29:34 Billingham, Shalise D. ([id number]) -------------------------------------------------------------------------------- Lower Extremity Assessment Details Patient Name: Kristin, [name] Date of Service: 06/24/2015 8:45 AM Medical Record Number:  [id number] Patient Account Number: [id number] Date of Birth/Sex: 1946-07-22 (69 y.o. Female) Treating RN: [name], RN, [name], [employer] Primary Care Physician: Glendon Axe Other Clinician: Referring Physician: Glendon Axe Treating Physician/Extender: Benjaman Pott in Treatment: 20 Edema Assessment Assessed: [Left: No] [Right: No] E[Left: dema] [Right: :] Calf Left: Right: Point of Measurement: 30 cm From Medial Instep 37.2 cm cm Ankle Left: Right: Point of Measurement: 9 cm From Medial Instep 24.5 cm cm Vascular Assessment Claudication: Claudication Assessment [Left:None] Pulses: Posterior Tibial Dorsalis Pedis Palpable: [Left:Yes] Extremity colors, hair growth, and conditions: Extremity Color: [Left:Mottled] Hair Growth on Extremity: [Left:Yes] Temperature  of Extremity: [Left:Warm] Capillary Refill: [Left:< 3 seconds] Dependent Rubor: [Left:No] Blanched when Elevated: [Left:No] Lipodermatosclerosis: [Left:No] Toe Nail Assessment Left: Right: Thick: No Discolored: No Deformed: No Improper Length and Hygiene: No KATALYNA, SOCARRAS ([id number]) Electronic Signature(s) Signed: 06/24/2015 8:52:14 AM By: Regan Lemming [name], RN Entered By: Regan Lemming on 06/24/2015 08:52:14 Drotar, Zivah D. ([id number]) -------------------------------------------------------------------------------- Multi Wound Chart Details Patient Name: Kristin Stare D. Date of Service: 06/24/2015 8:45 AM Medical Record Number: [id number] Patient Account Number: [id number] Date of Birth/Sex: [birth year] (69 y.o. Female) Treating RN: [name] Primary Care Physician: Glendon Axe Other Clinician: Referring Physician: Glendon Axe Treating Physician/Extender: Benjaman Pott in Treatment: 20 Vital Signs Height(in): 65 Pulse(bpm): 68 Weight(lbs): 248 Blood Pressure 138/56 (mmHg): Body Mass Index(BMI): 41 Temperature(F): 97.7 Respiratory Rate 18 (breaths/min): Photos: [2:No Photos] [5:No  Photos] [N/A:N/A] Wound Location: [2:Left Lower Leg - Distal Right Lower Leg -] [5:Posterior] [N/A:N/A] Wounding Event: [2:Blister] [5:Gradually Appeared] [N/A:N/A] Primary Etiology: [2:Venous Leg Ulcer] [5:Venous Leg Ulcer] [N/A:N/A] Comorbid History: [2:Cataracts, Asthma, Coronary Artery Disease, Coronary Artery Disease, Hypertension, Type II Diabetes, Osteoarthritis, Diabetes, Osteoarthritis, Neuropathy] [5:Cataracts, Asthma, Hypertension, Type II Neuropathy] [N/A:N/A] Date Acquired: [2:12/30/2014] [5:04/29/2015] [N/A:N/A] Weeks of Treatment: [2:20] [5:5] [N/A:N/A] Wound Status: [2:Open] [5:Open] [N/A:N/A] Clustered Wound: [2:Yes] [5:No] [N/A:N/A] Measurements L x W x D 6.3x5.5x0.5 [5:0.1x0.1x0.1] [N/A:N/A] (cm) Area (cm) : [2:27.214] [5:0.008] [N/A:N/A] Volume (cm) : [2:13.607] [5:0.001] [N/A:N/A] % Reduction in Area: [2:-3749.20%] [5:95.70%] [N/A:N/A] % Reduction in Volume: -19064.80% [5:94.70%] [N/A:N/A] Classification: [2:Full Thickness Without Exposed Support Structures] [5:Partial Thickness] [N/A:N/A] HBO Classification: [2:Grade 1] [5:Grade 1] [N/A:N/A] Exudate Amount: [2:Medium] [5:None Present] [N/A:N/A] Exudate Type: [2:Purulent] [5:N/A] [N/A:N/A] Exudate Color: [2:yellow, brown, green] [5:N/A] [N/A:N/A] Wound Margin: [2:Distinct, outline attached Flat and Intact] [N/A:N/A] Granulation Amount: [2:Medium (34-66%)] [5:None Present (0%)] [N/A:N/A] Granulation Quality: [5:N/A] [N/A:N/A] Red, Pink, Hyper- granulation Necrotic Amount: Medium (34-66%) Large (67-100%) N/A Necrotic Tissue: Adherent Slough Eschar N/A Exposed Structures: Fascia: No Fascia: No N/A Fat: No Fat: No Tendon: No Tendon: No Muscle: No Muscle: No Joint: No Joint: No Bone: No Bone: No Limited to Skin Limited to Skin Breakdown Breakdown Epithelialization: None None N/A Periwound Skin Texture: Edema: Yes Edema: Yes N/A Excoriation: No Excoriation: No Induration: No Induration: No Callus:  No Callus: No Crepitus: No Crepitus: No Fluctuance: No Fluctuance: No Friable: No Friable: No Rash: No Rash: No Scarring: No Scarring: No Periwound Skin Moist: Yes Dry/Scaly: Yes N/A Moisture: Maceration: No Maceration: No Dry/Scaly: No Moist: No Periwound Skin Color: Hemosiderin Staining: Yes Hemosiderin Staining: Yes N/A Atrophie Blanche: No Atrophie Blanche: No Cyanosis: No Cyanosis: No Ecchymosis: No Ecchymosis: No Erythema: No Erythema: No Mottled: No Mottled: No Pallor: No Pallor: No Rubor: No Rubor: No Temperature: No Abnormality No Abnormality N/A Tenderness on Yes Yes N/A Palpation: Wound Preparation: Ulcer Cleansing: Ulcer Cleansing: N/A Rinsed/Irrigated with Rinsed/Irrigated with Saline Saline Topical Anesthetic Topical Anesthetic Applied: Other: lidocaine Applied: Other: lidocaine 4% 4% Treatment Notes Electronic Signature(s) Signed: 06/24/2015 12:42:01 PM By: [name], RN, [name], [name], [name] Entered By: [name], RN, [name], Kim on 06/24/2015 09:04:36 Ramnath, ANJELI CASAD ([id number]) SHAQUANA, BUEL ([id number]) -------------------------------------------------------------------------------- Multi-Disciplinary Care Plan Details Patient Name: AINSLEY, DEAKINS. Date of Service: 06/24/2015 8:45 AM Medical Record Number: [id number] Patient Account Number: [id number] Date of Birth/Sex: 1946-01-10 (69 y.o. Female) Treating RN: [name] Primary Care Physician: Glendon Axe Other Clinician: Referring Physician: Glendon Axe Treating Physician/Extender: Benjaman Pott in Treatment: 20 Active Inactive Orientation to the Wound Care Program Nursing Diagnoses: Knowledge deficit related to the wound  healing center program Goals: Patient/caregiver will verbalize understanding of the [facility] Date Initiated: 01/31/2015 Goal Status: Active Interventions: Provide education on orientation to the wound center Notes: Venous Leg Ulcer Nursing  Diagnoses: Potential for venous Insuffiency (use before diagnosis confirmed) Goals: Non-invasive venous studies are completed as ordered Date Initiated: 01/31/2015 Goal Status: Active Patient/caregiver will verbalize understanding of disease process and disease management Date Initiated: 01/31/2015 Goal Status: Active Interventions: Assess peripheral edema status every visit. Notes: Wound/Skin Impairment Nursing Diagnoses: Impaired tissue integrity Knowledge deficit related to smoking impact on wound healing Pennino, Jensyn D. ([id number]) Goals: Patient/caregiver will verbalize understanding of skin care regimen Date Initiated: 01/31/2015 Goal Status: Active Ulcer/skin breakdown will heal within 14 weeks Date Initiated: 01/31/2015 Goal Status: Active Interventions: Assess ulceration(s) every visit Notes: Electronic Signature(s) Signed: 06/24/2015 12:42:01 PM By: [name], RN, [name], [name], [name] Entered By: [name], RN, [name], Kim on 06/24/2015 09:04:27 Pinegar, Eriana D. ([id number]) -------------------------------------------------------------------------------- Pain Assessment Details Patient Name: LOELLA, HICKLE D. Date of Service: 06/24/2015 8:45 AM Medical Record Number: [id number] Patient Account Number: [id number] Date of Birth/Sex: 20-Sep-1946 (69 y.o. Female) Treating RN: Baruch Gouty, RN, [name], [name] Primary Care Physician: Glendon Axe Other Clinician: Referring Physician: Glendon Axe Treating Physician/Extender: Benjaman Pott in Treatment: 20 Active Problems Location of Pain Severity and Description of Pain Patient Has Paino No Site Locations Pain Management and Medication Current Pain Management: Electronic Signature(s) Signed: 06/24/2015 8:48:18 AM By: Regan Lemming [name], RN Entered By: Regan Lemming on 06/24/2015 08:48:18 Charnley, Juliona D. ([id number]) -------------------------------------------------------------------------------- Patient/Caregiver Education Details Patient Name:  PHILLIPA, MORDEN D. Date of Service: 06/24/2015 8:45 AM Medical Record Number: [id number] Patient Account Number: [id number] Date of Birth/Gender: 04/03/1946 (69 y.o. Female) Treating RN: Baruch Gouty, RN, [name], [name] Primary Care Physician: Glendon Axe Other Clinician: Referring Physician: Glendon Axe Treating Physician/Extender: Benjaman Pott in Treatment: 20 Education Assessment Education Provided To: Patient Education Topics Provided Basic Hygiene: Methods: Explain/Verbal Responses: State content correctly Welcome To [facility]: Methods: Explain/Verbal Electronic Signature(s) Signed: 06/24/2015 9:29:45 AM By: Judene Companion MD Previous Signature: 06/24/2015 9:13:49 AM Version By: Regan Lemming [name], RN Entered By: Judene Companion on 06/24/2015 09:29:45 Kilker, Donnisha D. ([id number]) -------------------------------------------------------------------------------- Wound Assessment Details Patient Name: JEANISE, DURFEY D. Date of Service: 06/24/2015 8:45 AM Medical Record Number: [id number] Patient Account Number: [id number] Date of Birth/Sex: 1945/11/01 (69 y.o. Female) Treating RN: [name], RN, [name], [place] Primary Care Physician: Glendon Axe Other Clinician: Referring Physician: Glendon Axe Treating Physician/Extender: Benjaman Pott in Treatment: 20 Wound Status Wound Number: 2 Primary Venous Leg Ulcer Etiology: Wound Location: Left Lower Leg - Distal Wound Open Wounding Event: Blister Status: Date Acquired: 12/30/2014 Comorbid Cataracts, Asthma, Coronary Artery Weeks Of Treatment: 20 History: Disease, Hypertension, Type II Clustered Wound: Yes Diabetes, Osteoarthritis, Neuropathy Photos Wound Measurements Length: (cm) 6.3 Width: (cm) 5.5 Depth: (cm) 0.5 Area: (cm) 27.214 Volume: (cm) 13.607 % Reduction in Area: -3749.2% % Reduction in Volume: -19064.8% Epithelialization: None Tunneling: No Undermining: No Wound Description Full Thickness  Without Classification: Exposed Support Structures Diabetic Severity Grade 1 (Wagner): Wound Margin: Distinct, outline attached Exudate Amount: Medium Exudate Type: Purulent Exudate Color: yellow, brown, green Foul Odor After Cleansing: No Wound Bed Granulation Amount: Medium (34-66%) Exposed Structure Granulation Quality: Red, Pink, Hyper-granulation Fascia Exposed: No Kaus, Analese D. ([id number]) Necrotic Amount: Medium (34-66%) Fat Layer Exposed: No Necrotic Quality: Adherent Slough Tendon Exposed: No Muscle Exposed: No Joint Exposed: No Bone Exposed: No Limited to Skin Breakdown Periwound Skin Texture Texture Color No  Abnormalities Noted: No No Abnormalities Noted: No Callus: No Atrophie Blanche: No Crepitus: No Cyanosis: No Excoriation: No Ecchymosis: No Fluctuance: No Erythema: No Friable: No Hemosiderin Staining: Yes Induration: No Mottled: No Localized Edema: Yes Pallor: No Rash: No Rubor: No Scarring: No Temperature / Pain Moisture Temperature: No Abnormality No Abnormalities Noted: No Tenderness on Palpation: Yes Dry / Scaly: No Maceration: No Moist: Yes Wound Preparation Ulcer Cleansing: Rinsed/Irrigated with Saline Topical Anesthetic Applied: Other: lidocaine 4%, Treatment Notes Wound #2 (Left, Distal Lower Leg) 1. Cleansed with: Clean wound with Normal Saline 2. Anesthetic Topical Lidocaine 4% cream to wound bed prior to debridement 4. Dressing Applied: Santyl Ointment 5. Secondary Dressing Applied Non-Adherent pad ABD and Kerlix/Conform 7. Secured with Tape Support Garment 20-30 mm/Hg pressure to: Notes betadine paint to the right posterior CANA, MIGNANO ([id number]) Electronic Signature(s) Signed: 06/25/2015 3:54:44 PM By: Regan Lemming [name], RN Previous Signature: 06/24/2015 8:56:52 AM Version By: Regan Lemming [name], RN Entered By: Regan Lemming on 06/25/2015 15:54:44 Hendel, Amauri D.  ([id number]) -------------------------------------------------------------------------------- Wound Assessment Details Patient Name: TAKERA, RAYL D. Date of Service: 06/24/2015 8:45 AM Medical Record Number: [id number] Patient Account Number: [id number] Date of Birth/Sex: 1946-03-01 (69 y.o. Female) Treating RN: [name], RN, [name], [place] Primary Care Physician: Glendon Axe Other Clinician: Referring Physician: Glendon Axe Treating Physician/Extender: Benjaman Pott in Treatment: 20 Wound Status Wound Number: 5 Primary Venous Leg Ulcer Etiology: Wound Location: Right Lower Leg - Posterior Wound Open Wounding Event: Gradually Appeared Status: Date Acquired: 04/29/2015 Comorbid Cataracts, Asthma, Coronary Artery Weeks Of Treatment: 5 History: Disease, Hypertension, Type II Clustered Wound: No Diabetes, Osteoarthritis, Neuropathy Photos Wound Measurements Length: (cm) 0.1 Width: (cm) 0.1 Depth: (cm) 0.1 Area: (cm) 0.008 Volume: (cm) 0.001 % Reduction in Area: 95.7% % Reduction in Volume: 94.7% Epithelialization: None Tunneling: No Undermining: No Wound Description Classification: Partial Thickness Diabetic Severity (Wagner): Grade 1 Wound Margin: Flat and Intact Exudate Amount: None Present Wound Bed Granulation Amount: None Present (0%) Exposed Structure Necrotic Amount: Large (67-100%) Fascia Exposed: No Necrotic Quality: Eschar Fat Layer Exposed: No Tendon Exposed: No Muscle Exposed: No Joint Exposed: No Paz, Shequila D. ([id number]) Bone Exposed: No Limited to Skin Breakdown Periwound Skin Texture Texture Color No Abnormalities Noted: No No Abnormalities Noted: No Callus: No Atrophie Blanche: No Crepitus: No Cyanosis: No Excoriation: No Ecchymosis: No Fluctuance: No Erythema: No Friable: No Hemosiderin Staining: Yes Induration: No Mottled: No Localized Edema: Yes Pallor: No Rash: No Rubor: No Scarring: No Temperature / Pain Moisture  Temperature: No Abnormality No Abnormalities Noted: No Tenderness on Palpation: Yes Dry / Scaly: Yes Maceration: No Moist: No Wound Preparation Ulcer Cleansing: Rinsed/Irrigated with Saline Topical Anesthetic Applied: Other: lidocaine 4%, Treatment Notes Wound #5 (Right, Posterior Lower Leg) 1. Cleansed with: Clean wound with Normal Saline 2. Anesthetic Topical Lidocaine 4% cream to wound bed prior to debridement 4. Dressing Applied: Santyl Ointment 5. Secondary Dressing Applied Non-Adherent pad ABD and Kerlix/Conform 7. Secured with Tape Support Garment 20-30 mm/Hg pressure to: Notes betadine paint to the right posterior Electronic Signature(s) Signed: 06/25/2015 3:55:17 PM By: Regan Lemming [name], RN Previous Signature: 06/24/2015 8:57:05 AM Version By: Regan Lemming [name], RN Entered By: Regan Lemming on 06/25/2015 15:55:17 Posadas, ADDALINE PEPLINSKI ([id number]) Sassano, Ghada D. ([id number]) -------------------------------------------------------------------------------- Vitals Details Patient Name: Kristin Stare D. Date of Service: 06/24/2015 8:45 AM Medical Record Number: [id number] Patient Account Number: [id number] Date of Birth/Sex: 03-05-1946 (69 y.o. Female) Treating RN: [name], RN, [name], [employer] Primary Care Physician: Glendon Axe Other Clinician:  Referring Physician: Glendon Axe Treating Physician/Extender: Judene Companion Weeks in Treatment: 20 Vital Signs Time Taken: 08:49 Temperature (F): 97.7 Height (in): 65 Pulse (bpm): 68 Weight (lbs): 248 Respiratory Rate (breaths/min): 18 Body Mass Index (BMI): 41.3 Blood Pressure (mmHg): 138/56 Reference Range: 80 - 120 mg / dl Electronic Signature(s) Signed: 06/24/2015 8:49:58 AM By: Regan Lemming [name], RN Entered By: Regan Lemming on 06/24/2015 08:49:58

## 2015-06-24 NOTE — Progress Notes (Signed)
See i heal 

## 2015-07-01 ENCOUNTER — Encounter: Payer: PPO | Attending: Surgery | Admitting: Surgery

## 2015-07-01 DIAGNOSIS — E11622 Type 2 diabetes mellitus with other skin ulcer: Secondary | ICD-10-CM | POA: Diagnosis not present

## 2015-07-01 DIAGNOSIS — L97322 Non-pressure chronic ulcer of left ankle with fat layer exposed: Secondary | ICD-10-CM | POA: Diagnosis not present

## 2015-07-01 DIAGNOSIS — I83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: Secondary | ICD-10-CM | POA: Diagnosis not present

## 2015-07-01 DIAGNOSIS — I83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: Secondary | ICD-10-CM | POA: Diagnosis not present

## 2015-07-01 DIAGNOSIS — I890 Lymphedema, not elsewhere classified: Secondary | ICD-10-CM | POA: Insufficient documentation

## 2015-07-01 NOTE — Progress Notes (Signed)
RAIMI, GUILLERMO ([id number]) Visit Report for 07/01/2015 Arrival Information Details Patient Name: [name], [name] Date of Service: 07/01/2015 3:15 PM Medical Record Number: [id number] Patient Account Number: [id number] Date of Birth/Sex: 1945/12/05 (68 y.o. Female) Treating RN: [name], RN, [name], [name] Primary Care Physician: Glendon Axe Other Clinician: Referring Physician: Glendon Axe Treating Physician/Extender: Frann Rider in Treatment: 21 Visit Information History Since Last Visit Any new allergies or adverse reactions: No Patient Arrived: [name] Had a fall or experienced change in No Arrival Time: 15:27 activities of daily living that may affect Accompanied By: self risk of falls: Transfer Assistance: None Signs or symptoms of abuse/neglect since last No Patient Identification Verified: Yes visito Secondary Verification Process Yes Hospitalized since last visit: No Completed: Has Dressing in Place as Prescribed: Yes Patient Has Alerts: Yes Pain Present Now: No Patient Alerts: Patient on Blood Thinner Electronic Signature(s) Signed: 07/01/2015 3:29:27 PM By: Regan Lemming [name], RN Entered By: Regan Lemming on 07/01/2015 15:29:27 [name], Hanaa D. ([id number]) -------------------------------------------------------------------------------- Clinic Level of Care Assessment Details Patient Name: Kristin Stare D. Date of Service: 07/01/2015 3:15 PM Medical Record Number: [id number] Patient Account Number: [id number] Date of Birth/Sex: Jun 14, 1946 (68 y.o. Female) Treating RN: [name], RN, [name], [place] Primary Care Physician: Glendon Axe Other Clinician: Referring Physician: Glendon Axe Treating Physician/Extender: Frann Rider in Treatment: 21 Clinic Level of Care Assessment Items TOOL 4 Quantity Score []  - Use when only an EandM is performed on FOLLOW-UP visit 0 ASSESSMENTS - Nursing Assessment / Reassessment X - Reassessment of Co-morbidities (includes updates in  patient status) 1 10 X - Reassessment of Adherence to Treatment Plan 1 5 ASSESSMENTS - Wound and Skin Assessment / Reassessment []  - Simple Wound Assessment / Reassessment - one wound 0 X - Complex Wound Assessment / Reassessment - multiple wounds 2 5 []  - Dermatologic / Skin Assessment (not related to wound area) 0 ASSESSMENTS - Focused Assessment X - Circumferential Edema Measurements - multi extremities 2 5 []  - Nutritional Assessment / Counseling / Intervention 0 X - Lower Extremity Assessment (monofilament, tuning fork, pulses) 1 5 []  - Peripheral Arterial Disease Assessment (using hand held doppler) 0 ASSESSMENTS - Ostomy and/or Continence Assessment and Care []  - Incontinence Assessment and Management 0 []  - Ostomy Care Assessment and Management (repouching, etc.) 0 PROCESS - Coordination of Care X - Simple Patient / Family Education for ongoing care 1 15 []  - Complex (extensive) Patient / Family Education for ongoing care 0 []  - Staff obtains Programmer, systems, Records, Test Results / Process Orders 0 []  - Staff telephones HHA, Nursing Homes / Clarify orders / etc 0 []  - Routine Transfer to another Facility (non-emergent condition) 0 [name], [name] ([id number]) []  - Routine Hospital Admission (non-emergent condition) 0 []  - New Admissions / Biomedical engineer / Ordering NPWT, Apligraf, etc. 0 []  - Emergency Hospital Admission (emergent condition) 0 []  - Simple Discharge Coordination 0 []  - Complex (extensive) Discharge Coordination 0 PROCESS - Special Needs []  - Pediatric / Minor Patient Management 0 []  - Isolation Patient Management 0 []  - Hearing / Language / Visual special needs 0 []  - Assessment of Community assistance (transportation, D/C planning, etc.) 0 []  - Additional assistance / Altered mentation 0 []  - Support Surface(s) Assessment (bed, cushion, seat, etc.) 0 INTERVENTIONS - Wound Cleansing / Measurement []  - Simple Wound Cleansing - one wound 0 X - Complex Wound  Cleansing - multiple wounds 2 5 X - Wound Imaging (photographs - any number of wounds) 1 5 []  -  Wound Tracing (instead of photographs) 0 []  - Simple Wound Measurement - one wound 0 X - Complex Wound Measurement - multiple wounds 2 5 INTERVENTIONS - Wound Dressings X - Small Wound Dressing one or multiple wounds 2 10 []  - Medium Wound Dressing one or multiple wounds 0 []  - Large Wound Dressing one or multiple wounds 0 []  - Application of Medications - topical 0 []  - Application of Medications - injection 0 INTERVENTIONS - Miscellaneous []  - External ear exam 0 [name], [name] ([id number]) []  - Specimen Collection (cultures, biopsies, blood, body fluids, etc.) 0 []  - Specimen(s) / Culture(s) sent or taken to Lab for analysis 0 []  - Patient Transfer (multiple staff / [name]) 0 []  - Simple Staple / Suture removal (25 or less) 0 []  - Complex Staple / Suture removal (26 or more) 0 []  - Hypo / Hyperglycemic Management (close monitor of Blood Glucose) 0 []  - Ankle / Brachial Index (ABI) - do not check if billed separately 0 X - Vital Signs 1 5 Has the patient been seen at the hospital within the last three years: Yes Total Score: 105 Level Of Care: New/Established - Level 3 Electronic Signature(s) Signed: 07/01/2015 4:00:43 PM By: Regan Lemming [name], RN Entered By: Regan Lemming on 07/01/2015 16:00:43 [name], [name] ([id number]) -------------------------------------------------------------------------------- Encounter Discharge Information Details Patient Name: [name], [name] Date of Service: 07/01/2015 3:15 PM Medical Record Number: [id number] Patient Account Number: [id number] Date of Birth/Sex: 03/15/1946 (68 y.o. Female) Treating RN: [name], RN, [name], [name] Primary Care Physician: Glendon Axe Other Clinician: Referring Physician: Glendon Axe Treating Physician/Extender: Frann Rider in Treatment: 21 Encounter Discharge Information Items Discharge Pain  Level: 0 Discharge Condition: Stable Ambulatory Status: Ambulatory Discharge Destination: Home Transportation: Private Auto Accompanied By: self Schedule Follow-up Appointment: No Medication Reconciliation completed and provided to Patient/Care No Abra Lingenfelter: Provided on Clinical Summary of Care: 07/01/2015 Form Type Recipient Paper Patient LM Electronic Signature(s) Signed: 07/01/2015 4:02:20 PM By: Sharon Mt Previous Signature: 07/01/2015 4:01:46 PM Version By: Regan Lemming [name], RN Entered By: Sharon Mt on 07/01/2015 16:02:20 [name], [name] ([id number]) -------------------------------------------------------------------------------- Lower Extremity Assessment Details Patient Name: Ketter, Deb D. Date of Service: 07/01/2015 3:15 PM Medical Record Number: [id number] Patient Account Number: [id number] Date of Birth/Sex: 1946-04-26 (68 y.o. Female) Treating RN: [name], RN, [name], [employer] Primary Care Physician: Glendon Axe Other Clinician: Referring Physician: Glendon Axe Treating Physician/Extender: Frann Rider in Treatment: 21 Edema Assessment Assessed: [Left: No] [Right: No] E[Left: dema] [Right: :] Calf Left: Right: Point of Measurement: 30 cm From Medial Instep 37.4 cm cm Ankle Left: Right: Point of Measurement: 9 cm From Medial Instep 24.5 cm cm Vascular Assessment Pulses: Posterior Tibial Dorsalis Pedis Palpable: [Left:Yes] Extremity colors, hair growth, and conditions: Extremity Color: [Left:Mottled] Hair Growth on Extremity: [Left:No] Temperature of Extremity: [Left:Warm] Capillary Refill: [Left:< 3 seconds] Toe Nail Assessment Left: Right: Thick: No Discolored: No Deformed: No Improper Length and Hygiene: No Electronic Signature(s) Signed: 07/01/2015 3:31:22 PM By: Regan Lemming [name], RN Entered By: Regan Lemming on 07/01/2015 15:31:22 [name], [name] ([id number]) -------------------------------------------------------------------------------- Multi Wound  Chart Details Patient Name: Kristin Stare D. Date of Service: 07/01/2015 3:15 PM Medical Record Number: [id number] Patient Account Number: [id number] Date of Birth/Sex: [birth year] (68 y.o. Female) Treating RN: Baruch Gouty, RN, [name], [name] Primary Care Physician: Glendon Axe Other Clinician: Referring Physician: Glendon Axe Treating Physician/Extender: Frann Rider in Treatment: 21 Vital Signs Height(in): 65 Pulse(bpm): 68 Weight(lbs): 248 Blood Pressure 134/53 (mmHg): Body Mass  Index(BMI): 41 Temperature(F): 98.0 Respiratory Rate 17 (breaths/min): Photos: [2:No Photos] [5:No Photos] [N/A:N/A] Wound Location: [2:Left Lower Leg - Distal Right Lower Leg -] [5:Posterior] [N/A:N/A] Wounding Event: [2:Blister] [5:Gradually Appeared] [N/A:N/A] Primary Etiology: [2:Venous Leg Ulcer] [5:Venous Leg Ulcer] [N/A:N/A] Comorbid History: [2:Cataracts, Asthma, Coronary Artery Disease, Coronary Artery Disease, Hypertension, Type II Diabetes, Osteoarthritis, Diabetes, Osteoarthritis, Neuropathy] [5:Cataracts, Asthma, Hypertension, Type II Neuropathy] [N/A:N/A] Date Acquired: [2:12/30/2014] [5:04/29/2015] [N/A:N/A] Weeks of Treatment: [2:21] [5:6] [N/A:N/A] Wound Status: [2:Open] [5:Open] [N/A:N/A] Clustered Wound: [2:Yes] [5:No] [N/A:N/A] Measurements L x W x D 5.8x5.2x0.4 [5:0.7x1x0.1] [N/A:N/A] (cm) Area (cm) : [2:23.688] [5:0.55] [N/A:N/A] Volume (cm) : [2:9.475] [5:0.055] [N/A:N/A] % Reduction in Area: [2:-3250.50%] [5:-192.60%] [N/A:N/A] % Reduction in Volume: -13245.10% [5:-189.50%] [N/A:N/A] Classification: [2:Full Thickness Without Exposed Support Structures] [5:Unclassifiable] [N/A:N/A] HBO Classification: [2:Grade 1] [5:Grade 1] [N/A:N/A] Exudate Amount: [2:Medium] [5:None Present] [N/A:N/A] Exudate Type: [2:Serosanguineous] [5:N/A] [N/A:N/A] Exudate Color: [2:red, brown] [5:N/A] [N/A:N/A] Wound Margin: [2:Distinct, outline attached Flat and Intact] [N/A:N/A] Granulation Amount:  [2:Medium (34-66%)] [5:None Present (0%)] [N/A:N/A] Granulation Quality: [5:N/A] [N/A:N/A] Red, Pink, Hyper- granulation Necrotic Amount: Small (1-33%) Large (67-100%) N/A Necrotic Tissue: Adherent Slough Eschar N/A Exposed Structures: Fascia: No Fascia: No N/A Fat: No Fat: No Tendon: No Tendon: No Muscle: No Muscle: No Joint: No Joint: No Bone: No Bone: No Limited to Skin Limited to Skin Breakdown Breakdown Epithelialization: Small (1-33%) None N/A Periwound Skin Texture: Edema: Yes Edema: Yes N/A Excoriation: No Excoriation: No Induration: No Induration: No Callus: No Callus: No Crepitus: No Crepitus: No Fluctuance: No Fluctuance: No Friable: No Friable: No Rash: No Rash: No Scarring: No Scarring: No Periwound Skin Moist: Yes Dry/Scaly: Yes N/A Moisture: Maceration: No Maceration: No Dry/Scaly: No Moist: No Periwound Skin Color: Hemosiderin Staining: Yes Hemosiderin Staining: Yes N/A Atrophie Blanche: No Atrophie Blanche: No Cyanosis: No Cyanosis: No Ecchymosis: No Ecchymosis: No Erythema: No Erythema: No Mottled: No Mottled: No Pallor: No Pallor: No Rubor: No Rubor: No Temperature: No Abnormality No Abnormality N/A Tenderness on Yes Yes N/A Palpation: Wound Preparation: Ulcer Cleansing: Ulcer Cleansing: N/A Rinsed/Irrigated with Rinsed/Irrigated with Saline Saline Topical Anesthetic Topical Anesthetic Applied: Other: lidocaine Applied: Other: lidocaine 4% 4% Treatment Notes Electronic Signature(s) Signed: 07/01/2015 3:39:03 PM By: Regan Lemming [name], RN Entered By: Regan Lemming on 07/01/2015 15:39:03 [name], [name] ([id number]) [name], [name] ([id number]) -------------------------------------------------------------------------------- Multi-Disciplinary Care Plan Details Patient Name: HAILEE, HOLLICK D. Date of Service: 07/01/2015 3:15 PM Medical Record Number: [id number] Patient Account Number: [id number] Date of Birth/Sex: 02-07-1946 (68  y.o. Female) Treating RN: [name], RN, [name], [name] Primary Care Physician: Glendon Axe Other Clinician: Referring Physician: Glendon Axe Treating Physician/Extender: Frann Rider in Treatment: 21 Active Inactive Orientation to the Wound Care Program Nursing Diagnoses: Knowledge deficit related to the wound healing center program Goals: Patient/caregiver will verbalize understanding of the [facility] Program Date Initiated: 01/31/2015 Goal Status: Active Interventions: Provide education on orientation to the wound center Notes: Venous Leg Ulcer Nursing Diagnoses: Potential for venous Insuffiency (use before diagnosis confirmed) Goals: Non-invasive venous studies are completed as ordered Date Initiated: 01/31/2015 Goal Status: Active Patient/caregiver will verbalize understanding of disease process and disease management Date Initiated: 01/31/2015 Goal Status: Active Interventions: Assess peripheral edema status every visit. Notes: Wound/Skin Impairment Nursing Diagnoses: Impaired tissue integrity Knowledge deficit related to smoking impact on wound healing Lewellyn, Kelina D. ([id number]) Goals: Patient/caregiver will verbalize understanding of skin care regimen Date Initiated: 01/31/2015 Goal Status: Active Ulcer/skin breakdown will heal within 14 weeks Date Initiated: 01/31/2015 Goal Status: Active Interventions:  Assess ulceration(s) every visit Notes: Electronic Signature(s) Signed: 07/01/2015 3:38:54 PM By: Regan Lemming [name], RN Entered By: Regan Lemming on 07/01/2015 15:38:54 [name], [name] ([id number]) -------------------------------------------------------------------------------- Pain Assessment Details Patient Name: MORRIGAN, WICKENS D. Date of Service: 07/01/2015 3:15 PM Medical Record Number: [id number] Patient Account Number: [id number] Date of Birth/Sex: 11-Jan-1946 (68 y.o. Female) Treating RN: Baruch Gouty, RN, [name], [name] Primary Care Physician: Glendon Axe Other Clinician: Referring Physician: Glendon Axe Treating Physician/Extender: Frann Rider in Treatment: 21 Active Problems Location of Pain Severity and Description of Pain Patient Has Paino No Site Locations Pain Management and Medication Current Pain Management: Electronic Signature(s) Signed: 07/01/2015 3:29:49 PM By: Regan Lemming [name], RN Entered By: Regan Lemming on 07/01/2015 15:29:49 [name], [name] ([id number]) -------------------------------------------------------------------------------- Patient/Caregiver Education Details Patient Name: LUMA, CLOPPER D. Date of Service: 07/01/2015 3:15 PM Medical Record Number: [id number] Patient Account Number: [id number] Date of Birth/Gender: 30-Apr-1946 (68 y.o. Female) Treating RN: [name], RN, [name], [name] Primary Care Physician: Glendon Axe Other Clinician: Referring Physician: Glendon Axe Treating Physician/Extender: Frann Rider in Treatment: 21 Education Assessment Education Provided To: Patient Education Topics Provided Welcome To [facility]: Methods: Explain/Verbal Responses: State content correctly Electronic Signature(s) Signed: 07/01/2015 4:01:57 PM By: Regan Lemming [name], RN Entered By: Regan Lemming on 07/01/2015 16:01:57 [name], [name] D. ([id number]) -------------------------------------------------------------------------------- Wound Assessment Details Patient Name: HAZELLE, WOOLLARD D. Date of Service: 07/01/2015 3:15 PM Medical Record Number: [id number] Patient Account Number: [id number] Date of Birth/Sex: [birth year] (68 y.o. Female) Treating RN: [name], RN, [name], [place] Primary Care Physician: Glendon Axe Other Clinician: Referring Physician: Glendon Axe Treating Physician/Extender: Frann Rider in Treatment: 21 Wound Status Wound Number: 2 Primary Venous Leg Ulcer Etiology: Wound Location: Left Lower Leg - Distal Wound Open Wounding Event: Blister Status: Date Acquired:  12/30/2014 Comorbid Cataracts, Asthma, Coronary Artery Weeks Of Treatment: 21 History: Disease, Hypertension, Type II Clustered Wound: Yes Diabetes, Osteoarthritis, Neuropathy Photos Photo Uploaded By: Regan Lemming on 07/01/2015 17:14:26 Wound Measurements Length: (cm) 5.8 Width: (cm) 5.2 Depth: (cm) 0.4 Area: (cm) 23.688 Volume: (cm) 9.475 % Reduction in Area: -3250.5% % Reduction in Volume: -13245.1% Epithelialization: Small (1-33%) Tunneling: No Undermining: No Wound Description Full Thickness Without Exposed Classification: Support Structures Diabetic Severity Grade 1 (Wagner): Wound Margin: Distinct, outline attached Exudate Amount: Medium Exudate Type: Serosanguineous Exudate Color: red, brown Foul Odor After Cleansing: No Wound Bed Granulation Amount: Medium (34-66%) Exposed Structure Worley, Pranavi D. ([id number]) Granulation Quality: Red, Pink, Hyper-granulation Fascia Exposed: No Necrotic Amount: Small (1-33%) Fat Layer Exposed: No Necrotic Quality: Adherent Slough Tendon Exposed: No Muscle Exposed: No Joint Exposed: No Bone Exposed: No Limited to Skin Breakdown Periwound Skin Texture Texture Color No Abnormalities Noted: No No Abnormalities Noted: No Callus: No Atrophie Blanche: No Crepitus: No Cyanosis: No Excoriation: No Ecchymosis: No Fluctuance: No Erythema: No Friable: No Hemosiderin Staining: Yes Induration: No Mottled: No Localized Edema: Yes Pallor: No Rash: No Rubor: No Scarring: No Temperature / Pain Moisture Temperature: No Abnormality No Abnormalities Noted: No Tenderness on Palpation: Yes Dry / Scaly: No Maceration: No Moist: Yes Wound Preparation Ulcer Cleansing: Rinsed/Irrigated with Saline Topical Anesthetic Applied: Other: lidocaine 4%, Treatment Notes Wound #2 (Left, Distal Lower Leg) 1. Cleansed with: Clean wound with Normal Saline 2. Anesthetic Topical Lidocaine 4% cream to wound bed prior to debridement 4.  Dressing Applied: Santyl Ointment 5. Secondary Dressing Applied Non-Adherent pad ABD and Kerlix/Conform 7. Secured with Tape Support Garment 20-30 mm/Hg pressure to: Notes betadine paint to the right posterior Hehr,  SHARIKA MOSQUERA ([id number]) Electronic Signature(s) Signed: 07/01/2015 3:37:26 PM By: Regan Lemming [name], RN Entered By: Regan Lemming on 07/01/2015 15:37:25 Perrell, Shernell D. ([id number]) -------------------------------------------------------------------------------- Wound Assessment Details Patient Name: SHAUNIECE, KWAN D. Date of Service: 07/01/2015 3:15 PM Medical Record Number: [id number] Patient Account Number: [id number] Date of Birth/Sex: May 27, 1946 (68 y.o. Female) Treating RN: [name], RN, [name], [place] Primary Care Physician: Glendon Axe Other Clinician: Referring Physician: Glendon Axe Treating Physician/Extender: Frann Rider in Treatment: 21 Wound Status Wound Number: 5 Primary Venous Leg Ulcer Etiology: Wound Location: Right Lower Leg - Posterior Wound Open Wounding Event: Gradually Appeared Status: Date Acquired: 04/29/2015 Comorbid Cataracts, Asthma, Coronary Artery Weeks Of Treatment: 6 History: Disease, Hypertension, Type II Clustered Wound: No Diabetes, Osteoarthritis, Neuropathy Photos Photo Uploaded By: Regan Lemming on 07/01/2015 17:15:49 Wound Measurements Length: (cm) 0.7 Width: (cm) 1 Depth: (cm) 0.1 Area: (cm) 0.55 Volume: (cm) 0.055 % Reduction in Area: -192.6% % Reduction in Volume: -189.5% Epithelialization: None Wound Description Classification: Unclassifiable Diabetic Severity (Wagner): Grade 1 Wound Margin: Flat and Intact Exudate Amount: None Present Wound Bed Granulation Amount: None Present (0%) Exposed Structure Necrotic Amount: Large (67-100%) Fascia Exposed: No Necrotic Quality: Eschar Fat Layer Exposed: No Tendon Exposed: No Muscle Exposed: No Opiela, Debbe D. ([id number]) Joint Exposed: No Bone Exposed: No Limited  to Skin Breakdown Periwound Skin Texture Texture Color No Abnormalities Noted: No No Abnormalities Noted: No Callus: No Atrophie Blanche: No Crepitus: No Cyanosis: No Excoriation: No Ecchymosis: No Fluctuance: No Erythema: No Friable: No Hemosiderin Staining: Yes Induration: No Mottled: No Localized Edema: Yes Pallor: No Rash: No Rubor: No Scarring: No Temperature / Pain Moisture Temperature: No Abnormality No Abnormalities Noted: No Tenderness on Palpation: Yes Dry / Scaly: Yes Maceration: No Moist: No Wound Preparation Ulcer Cleansing: Rinsed/Irrigated with Saline Topical Anesthetic Applied: Other: lidocaine 4%, Treatment Notes Wound #5 (Right, Posterior Lower Leg) 1. Cleansed with: Clean wound with Normal Saline 2. Anesthetic Topical Lidocaine 4% cream to wound bed prior to debridement 4. Dressing Applied: Santyl Ointment 5. Secondary Dressing Applied Non-Adherent pad ABD and Kerlix/Conform 7. Secured with Tape Support Garment 20-30 mm/Hg pressure to: Notes betadine paint to the right posterior Electronic Signature(s) Signed: 07/01/2015 3:37:57 PM By: Regan Lemming [name], RN Entered By: Regan Lemming on 07/01/2015 15:37:57 Hennes, Shaleena D. ([id number]) Reep, Kataleah D. ([id number]) -------------------------------------------------------------------------------- Vitals Details Patient Name: Kristin Stare D. Date of Service: 07/01/2015 3:15 PM Medical Record Number: [id number] Patient Account Number: [id number] Date of Birth/Sex: 01/17/1946 (68 y.o. Female) Treating RN: [name], RN, [name], [place] Primary Care Physician: Glendon Axe Other Clinician: Referring Physician: Glendon Axe Treating Physician/Extender: Frann Rider in Treatment: 21 Vital Signs Time Taken: 15:30 Temperature (F): 98.0 Height (in): 65 Pulse (bpm): 68 Weight (lbs): 248 Respiratory Rate (breaths/min): 17 Body Mass Index (BMI): 41.3 Blood Pressure (mmHg): 134/53 Reference Range: 80  - 120 mg / dl Electronic Signature(s) Signed: 07/01/2015 3:30:28 PM By: Regan Lemming [name], RN Entered By: Regan Lemming on 07/01/2015 15:30:28

## 2015-07-01 NOTE — Progress Notes (Addendum)
[name], [name] ([id number]) Visit Report for 07/01/2015 Chief Complaint Document Details Patient Name: [name], [name] Date of Service: 07/01/2015 3:15 PM Medical Record Number: [id number] Patient Account Number: [id number] Date of Birth/Sex: [birth year] (69 y.o. Female) Treating RN: [name] Primary Care Physician: Glendon Axe Other Clinician: Referring Physician: Glendon Axe Treating Physician/Extender: Frann Rider in Treatment: 21 Information Obtained from: Patient Chief Complaint Patient presents to the wound care center for a consult due non healing wound 69 year old patient comes with a history of having a ulcer on the left lower extremity for the past 4 weeks. she says she's had swelling of both lower extremities for about a year after she started having prednisone. 02/07/2015 -- her vascular appointments obtained were in the first and third week of June. she is able to go to [place] and we will try and get her some earlier appointments. Other than that nothing else has changed in her management. Electronic Signature(s) Signed: 07/01/2015 3:49:29 PM By: [name], [name] Entered By: [name] on 07/01/2015 15:49:29 Aro, Donyetta D. ([id number]) -------------------------------------------------------------------------------- HPI Details Patient Name: [name], [name] Date of Service: 07/01/2015 3:15 PM Medical Record Number: [id number] Patient Account Number: [id number] Date of Birth/Sex: 02-21-1946 (69 y.o. Female) Treating RN: [name] Primary Care Physician: Glendon Axe Other Clinician: Referring Physician: Glendon Axe Treating Physician/Extender: Frann Rider in Treatment: 21 History of Present Illness HPI Description: 69 year old patient who is known to have diabetes mellitus type 2, chronic renal insufficiency, coronary artery disease, hypertension, hypercholesterolemia, temporal arteritis and inflammatory arthritiss also has a history  of having a hysterectomy and some orthopedic related surgeries. The ulcer on the left lower extremity started off as a blister and then. Got progressively worse. She does not have any fever or chills and has not had any recent surgical intervention for this. Her last hemoglobin A1c was 10.1 in September 2015. She has been recently put on doxycycline by her PCP. She is now also allergic to doxycycline and was this was changed over to Keflex. 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 She does see a cardiologist and also takes a diuretic. 02/07/2015 her arterial and venous duplex studies to be done have dates been given as the first and third week of June. This is at [facility]. We are going to try and get early appointments at [facility]. other than that nothing has changed in her management. 02/14/2015 -- we have been able to get her an appointment in [facility] on May 20 which is much earlier than her previous ones at [place]. She continues with her prednisone and her sugars are in the range of 150-200. 02/21/2015 We were able to get a vascular lab workup for her today and she is going to be there at 2:00 this afternoon. the swelling of her leg has gone down significantly but she still has some tenderness over the wounds. 02/28/2015 - She has had one of two vascular workups done, and this coming Tuesday has another, at [facility]. She continues to be on steroid medications. She has significant sensitivity in her left lower extremity and has pain suggestive of neuropathic pain and I have asked her to address this with her primary care physician. 03/07/2015 -- The patient saw Dr. Lucky Cowboy for a consultation and he has had her arteries are okay but she has 2 incompetent veins on the  left lower extremity and he is going to set her up for surgery. Official report  is awaited. Addendum: Official reports are now available and on 03/04/2015. She was seen and lower extremity venous duplex exam was done. There was reflux present within the left greater saphenous vein below the knee and also the left small saphenous vein. Arterial duplex showed normal triphasic waveforms throughout the left lower extremity without any significant stenosis. Her ABIs were noncompressible bilaterally but a waveforms were normal and a digital pressures were normal bilaterally consistent with no significant arterial insufficiency. He has recommended endovenous ablation of both the left small saphenous and the left great saphenous vein. This would still be scheduled later. 03/14/2015 -- she has heard back from the vascular office and has surgery scheduled for sometime in July. Her rheumatologist has decreased her prednisone dosage but she still on it. She has also had cataract surgery in her right eye recently this week. ARROW, EMMERICH ([id number]) 03/20/2015 - No new complaints today. Pain improved. No fever or chills. Tolerating 2 layer compression. 04/14/2015 -- she was doing very well today she went off on vacation and now her edema has increased markedly the ulceration is bigger and her diabetes is not under control. 04/21/2015 -- I spoke to her PCP Dr. Candiss Norse and discussed the management which would include being seen by a general surgeon for debridement and taking multiple punch biopsies which would help in establishing the diagnosis of this is a vasculitis. She is agreeable about this and will set her up for the procedure with Dr. Tamala [facility] at [facility]. She was seen by the surgeon Dr. Jamal Collin. 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 She is scheduled to have laser ablation done next week.Pt was sent here for possible biopsy to look for vasculitis. Feel it  would be better to wait after laser ablation is completed- the ulcer may heal fully and biopsy may not be necessary 04/29/2015 -- she had the venous ablation done by Dr. Lucky Cowboy last Friday and we do not have any notes yet. She is doing fine otherwise. 05/06/2015 --Review of her recent vascular intervention shows that she was seen by Dr. Lucky Cowboy on 04/29/2015. The follow-up duplex which was done showed that both the great saphenous vein and the small saphenous vein remained patent with reflux consistent with an unsuccessful ablation. He has rescheduled her for another the endovenous ablation to be done in about 4 weekso time. 05/13/2015 -- he was seen by her surgeon Dr. Jamal Collin who asked her to continue with conservative therapy and he would speak to Dr. Lucky Cowboy about her management. Dr. Lucky Cowboy is going to schedule her surgery in the middle of August for a repeat endovenous ablation. Her pus culture from last week has grown : [place] her noted her sensitivity report but due to her multiple allergies I had tried clindamycin and she developed a rash with this too. She has been prescribed and anti-buttocks in the ER and is has it at home and she will let is know what she is going to be taking. 05/20/2015 -- she has developed a small spot on her right lower extremity but besides that it is not a full fledged ulceration. She did not get to see Dr. Lucky Cowboy last week and hopefully she will see him in the near future. 05/27/2015 -she is still awaiting her  appointment with Dr.Dew and her vascular procedure is not scheduled until August 19. She will be seeing her PCP tomorrow and I have asked her to convey our discussion so that she is aware that debridement has not been done yet. 06/03/2015 -- was seen by her rheumatologist Dr. Dorthula Matas, who has been treating her for temporal arteritis and in his note has  mentioned the possibility of vasculitis or pyoderma gangrenosum. He is lowering her prednisone to 12-1/2 mg for 1 month and then 10 mg per the next month. I will again make an attempt to speak to her PCP Dr. Candiss Norse and her surgeon Dr. 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 06/17/2015 -- Dr.Dew did her vascular procedure last week and a follow-up venous ultrasound shows good resolution of the veins as per the patient's history. He is to see her back in 2 weeks. [name], [name] ([id number]) Electronic Signature(s) Signed: 07/01/2015 3:49:40 PM By: [name], [name] Entered By: [name] on 07/01/2015 15:49:40 Vanstone, Arah D. ([id number]) -------------------------------------------------------------------------------- Physical Exam Details Patient Name: [name], [name] Date of Service: 07/01/2015 3:15 PM Medical Record Number: [id number] Patient Account Number: [id number] Date of Birth/Sex: 12-Oct-1946 (69 y.o. Female) Treating RN: [name] Primary Care Physician: Glendon Axe Other Clinician: Referring Physician: Glendon Axe Treating Physician/Extender: Frann Rider in Treatment: 21 Constitutional . Pulse regular. Respirations normal and unlabored. Afebrile. . Eyes Nonicteric. Reactive to light. Ears, Nose, Mouth, and Throat Lips, teeth, and gums WNL.Marland Kitchen Moist mucosa without lesions . Neck supple and nontender. No palpable supraclavicular or cervical adenopathy. Normal sized without goiter. Respiratory WNL. No retractions.. Cardiovascular Pedal Pulses WNL. No clubbing, cyanosis or edema. Chest Breasts symmetical and no nipple discharge.. Breast tissue WNL, no masses, lumps, or tenderness.. Lymphatic No adneopathy. No adenopathy. No adenopathy. Musculoskeletal Adexa without tenderness or enlargement.. Digits and nails w/o clubbing, cyanosis, infection,  petechiae, ischemia, or inflammatory conditions.. Integumentary (Hair, Skin) No suspicious lesions. No crepitus or fluctuance. No peri-wound warmth or erythema. No masses.Marland Kitchen Psychiatric Judgement and insight Intact.. No evidence of depression, anxiety, or agitation.. Notes There is some healthy granulation tissue but at the 12:00 position, she had a pocket of pus which was under the superior flap. This was removed and cultured. Electronic Signature(s) Signed: 07/01/2015 3:51:22 PM By: [name], [name] Entered By: [name] on 07/01/2015 15:51:22 [name], [name] Kitchen ([id number]) -------------------------------------------------------------------------------- Physician Orders Details Patient Name: [name], [name] Date of Service: 07/01/2015 3:15 PM Medical Record Patient Account Number: [id number] Number: [name], RN, [name], Treating RN: 1946-05-05 (69 y.o. 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 Wound #5 Right,Posterior Lower Leg o Clean wound with Normal Saline. Anesthetic Wound #2 Left,Distal Lower Leg o Topical Lidocaine 4% cream applied to  wound bed prior to debridement Wound #5 Right,Posterior Lower  Leg o Topical Lidocaine 4% cream applied to wound bed prior to debridement Primary Wound Dressing Wound #2 Left,Distal Lower Leg o Santyl Ointment Wound #5 Right,Posterior Lower Leg o Other: - betadine paint Secondary Dressing Wound #2 Left,Distal Lower Leg o ABD and Kerlix/Conform Lascano, Muna D. ([id number]) Wound #5 Right,Posterior Lower Leg o Boardered Foam Dressing Dressing Change Frequency Wound #2 Left,Distal Lower Leg o Change dressing every day. Wound #5 Right,Posterior Lower Leg o Change dressing every day. Follow-up Appointments Wound #2 Left,Distal Lower Leg o Return Appointment in 1 week. Wound #5 Right,Posterior Lower Leg o Return Appointment in 1 week. Edema Control Wound #2 Left,Distal Lower Leg o Support Garment 20-30 mm/Hg pressure to: Wound #5 Right,Posterior Lower Leg o Support Garment 20-30 mm/Hg pressure to: Additional Orders / Instructions Wound #2 Left,Distal Lower Leg o Increase protein intake. Wound #5 Right,Posterior Lower Leg o Increase protein intake. Electronic Signature(s) Signed: 07/01/2015 3:59:52 PM By: Regan Lemming [name], RN Signed: 07/01/2015 4:40:03 PM By: [name], [name] Entered By: Regan Lemming on 07/01/2015 15:59:51 [name], [name] Kitchen ([id number]) -------------------------------------------------------------------------------- Problem List Details Patient Name: [name], [name] Date of Service: 07/01/2015 3:15 PM Medical Record Number: [id number] Patient Account Number: [id number] Date of Birth/Sex: 20-Nov-1945 (69 y.o. 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 ([id number]) -------------------------------------------------------------------------------- Progress Note Details Patient Name: [name], [name] Date of Service: 07/01/2015 3:15 PM Medical Record Number: [id number] Patient Account Number: [id number] Date of Birth/Sex: 01/15/1946 (69 y.o. Female) Treating RN: [name] Primary Care Physician: Glendon Axe Other Clinician: Referring Physician: Glendon Axe Treating Physician/Extender: Frann Rider in Treatment: 21 Subjective Chief Complaint Information obtained from Patient Patient presents to the wound care center for a consult due non healing wound 69 year old patient comes with a history of having a ulcer on the left lower extremity for the past 4 weeks. she says she's had swelling of both lower extremities for about a year after she started having prednisone. 02/07/2015 -- her vascular appointments obtained were in the first and third week of June. she is able to go to [place] and we will try and get her some earlier appointments. Other than that nothing else has changed in her management. History of Present Illness (HPI) 69 year old patient who is known to have diabetes mellitus type 2, chronic renal insufficiency, coronary artery disease, hypertension, hypercholesterolemia, temporal arteritis and inflammatory arthritiss also has a history of having a hysterectomy and some orthopedic related  surgeries. The ulcer on the left lower extremity started off as a blister and then. Got progressively worse. She does not have any fever or chills and has not had any recent surgical intervention for this. Her last hemoglobin A1c was 10.1 in September 2015. She has been  recently put on doxycycline by her PCP. She is now also allergic to doxycycline and was this was changed over to Keflex. 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 She does see a cardiologist and also takes a diuretic. 02/07/2015 her arterial and venous duplex studies to be done have dates been given as the first and third week of June. This is at [facility]. We are going to try and get early appointments at [facility]. other than that nothing has changed in her management. 02/14/2015 -- we have been able to get her an appointment in [facility] on May 20 which is much earlier than her previous ones at [place]. She continues with her prednisone and her sugars are in the range of 150-200. 02/21/2015 We were able to get a vascular lab workup for her today and she is going to be there at 2:00 this afternoon. the swelling of her leg has gone down significantly but she still has some tenderness over the wounds. 02/28/2015 - She has had one of two vascular workups done, and this coming Tuesday has another, at [place] [facility]. She continues to be on steroid medications. She has significant sensitivity in her left lower extremity and has pain suggestive of neuropathic pain and I have asked her to address this with her primary care physician. 03/07/2015 -- The patient saw Dr. Lucky Cowboy for a consultation and he has had her arteries are okay but she has 2 incompetent veins on the left lower extremity and he is going to set her up for surgery. Official report is awaited. Addendum: Official reports are now  available and on 03/04/2015. She was seen and lower extremity Tokunaga, Sherlie D. ([id number]) venous duplex exam was done. There was reflux present within the left greater saphenous vein below the knee and also the left small saphenous vein. Arterial duplex showed normal triphasic waveforms throughout the left lower extremity without any significant stenosis. Her ABIs were noncompressible bilaterally but a waveforms were normal and a digital pressures were normal bilaterally consistent with no significant arterial insufficiency. He has recommended endovenous ablation of both the left small saphenous and the left great saphenous vein. This would still be scheduled later. 03/14/2015 -- she has heard back from the vascular office and has surgery scheduled for sometime in July. Her rheumatologist has decreased her prednisone dosage but she still on it. She has also had cataract surgery in her right eye recently this week. 03/20/2015 - No new complaints today. Pain improved. No fever or chills. Tolerating 2 layer compression. 04/14/2015 -- she was doing very well today she went off on vacation and now her edema has increased markedly the ulceration is bigger and her diabetes is not under control. 04/21/2015 -- I spoke to her PCP Dr. Candiss Norse and discussed the management which would include being seen by a general surgeon for debridement and taking multiple punch biopsies which would help in establishing the diagnosis of this is a vasculitis. She is agreeable about this and will set her up for the procedure with Dr. Tamala Julian at [facility]. She was seen by the surgeon Dr. Jamal Collin. 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 She is scheduled to have laser ablation done next week.Pt was sent here for possible biopsy to look for vasculitis. Feel it would  be better to wait after laser ablation is  completed- the ulcer may heal fully and biopsy may not be necessary 04/29/2015 -- she had the venous ablation done by Dr. Lucky Cowboy last Friday and we do not have any notes yet. She is doing fine otherwise. 05/06/2015 --Review of her recent vascular intervention shows that she was seen by Dr. Lucky Cowboy on 04/29/2015. The follow-up duplex which was done showed that both the great saphenous vein and the small saphenous vein remained patent with reflux consistent with an unsuccessful ablation. He has rescheduled her for another the endovenous ablation to be done in about 4 weeks time. 05/13/2015 -- he was seen by her surgeon Dr. Jamal Collin who asked her to continue with conservative therapy and he would speak to Dr. Lucky Cowboy about her management. Dr. Lucky Cowboy is going to schedule her surgery in the middle of August for a repeat endovenous ablation. Her pus culture from last week has grown : [place] her noted her sensitivity report but due to her multiple allergies I had tried clindamycin and she developed a rash with this too. She has been prescribed and anti-buttocks in the ER and is has it at home and she will let is know what she is going to be taking. 05/20/2015 -- she has developed a small spot on her right lower extremity but besides that it is not a full fledged ulceration. She did not get to see Dr. Lucky Cowboy last week and hopefully she will see him in the near future. 05/27/2015 -she is still awaiting her appointment with Dr.Dew and her vascular procedure is not scheduled until August 19. She will be seeing her PCP tomorrow and I have asked her to convey our discussion so that she is aware that debridement has not been done yet. 06/03/2015 -- was seen by her rheumatologist Dr. Dorthula Matas, who has been treating her for GUILLERMO, DIFRANCESCO. ([id number]) temporal arteritis and in his note has mentioned the possibility  of vasculitis or pyoderma gangrenosum. He is lowering her prednisone to 12-1/2 mg for 1 month and then 10 mg per the next month. I will again make an attempt to speak to her PCP Dr. Candiss Norse and her surgeon Dr. 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 06/17/2015 -- Dr.Dew did her vascular procedure last week and a follow-up venous ultrasound shows good resolution of the veins as per the patient's history. He is to see her back in 2 weeks. Objective Constitutional Pulse regular. Respirations normal and unlabored. Afebrile. Vitals Time Taken: 3:30 PM, Height: 65 in, Weight: 248 lbs, BMI: 41.3, Temperature: 98.0 F, Pulse: 68 bpm, Respiratory Rate: 17 breaths/min, Blood Pressure: 134/53 mmHg. Eyes Nonicteric. Reactive to light. Ears, Nose, Mouth, and Throat Lips, teeth, and gums WNL.Marland Kitchen Moist mucosa without lesions . Neck supple and nontender. No palpable supraclavicular or cervical adenopathy. Normal sized without goiter. Respiratory WNL. No retractions.. Cardiovascular Pedal Pulses WNL. No clubbing, cyanosis or edema. Chest Breasts symmetical and no nipple discharge.. Breast tissue WNL, no masses, lumps, or tenderness.. Lymphatic No adneopathy. No adenopathy. No adenopathy. Musculoskeletal Adexa without tenderness or enlargement.. Digits and nails w/o clubbing, cyanosis, infection, petechiae, ischemia, or inflammatory conditions.Marland Kitchen Psychiatric Judgement and insight Intact.. No evidence of depression, anxiety, or agitation.Marland Kitchen [name], PEGUES ([id number]) General Notes: There is some healthy granulation tissue but at the 12:00 position, she  had a pocket of pus which was under the superior flap. This was removed and cultured. Integumentary (Hair, Skin) No suspicious lesions. No crepitus or fluctuance. No peri-wound warmth or erythema. No masses.. Wound #2 status is Open. Original cause of  wound was Blister. The wound is located on the Left,Distal Lower Leg. The wound measures 5.8cm length x 5.2cm width x 0.4cm depth; 23.688cm^2 area and 9.475cm^3 volume. The wound is limited to skin breakdown. There is no tunneling or undermining noted. There is a medium amount of serosanguineous drainage noted. The wound margin is distinct with the outline attached to the wound base. There is medium (34-66%) red, pink granulation within the wound bed. There is a small (1-33%) amount of necrotic tissue within the wound bed including Adherent Slough. The periwound skin appearance exhibited: Localized Edema, Moist, Hemosiderin Staining. The periwound skin appearance did not exhibit: Callus, Crepitus, Excoriation, Fluctuance, Friable, Induration, Rash, Scarring, Dry/Scaly, Maceration, Atrophie Blanche, Cyanosis, Ecchymosis, Mottled, Pallor, Rubor, Erythema. Periwound temperature was noted as No Abnormality. The periwound has tenderness on palpation. Wound #5 status is Open. Original cause of wound was Gradually Appeared. The wound is located on the Right,Posterior Lower Leg. The wound measures 0.7cm length x 1cm width x 0.1cm depth; 0.55cm^2 area and 0.055cm^3 volume. The wound is limited to skin breakdown. There is a none present amount of drainage noted. The wound margin is flat and intact. There is no granulation within the wound bed. There is a large (67-100%) amount of necrotic tissue within the wound bed including Eschar. The periwound skin appearance exhibited: Localized Edema, Dry/Scaly, Hemosiderin Staining. The periwound skin appearance did not exhibit: Callus, Crepitus, Excoriation, Fluctuance, Friable, Induration, Rash, Scarring, Maceration, Moist, Atrophie Blanche, Cyanosis, Ecchymosis, Mottled, Pallor, Rubor, Erythema. Periwound temperature was noted as No Abnormality. The periwound has tenderness on palpation. 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 ([id number]) After washing out the wound with saline I have recommended to continue with Santyl and I will await her culture report before putting her on any medication. She has several allergies and she will call me back with the only medication which she is able to tolerate. She will continue with compression stockings and also with elevation of the limb. I will see her back next week. Plan Wound Cleansing: Wound #2 Left,Distal Lower Leg: Clean wound with Normal Saline. Wound #5 Right,Posterior Lower Leg: Clean wound with Normal Saline. Anesthetic: Wound #2 Left,Distal Lower Leg: Topical Lidocaine 4% cream applied to wound bed prior to debridement Wound #5 Right,Posterior Lower Leg: Topical Lidocaine 4% cream applied to wound bed prior to debridement Primary Wound Dressing: Wound #2 Left,Distal Lower Leg: Santyl Ointment Wound #5 Right,Posterior Lower Leg: Other: - betadine paint Secondary Dressing: Wound #2 Left,Distal Lower Leg: ABD and Kerlix/Conform Wound #5 Right,Posterior Lower Leg: Boardered Foam Dressing Dressing Change Frequency: Wound #2 Left,Distal Lower Leg: Change dressing every day. Wound #5 Right,Posterior Lower Leg: Change dressing every day. Follow-up Appointments: Wound #2 Left,Distal Lower Leg: Return Appointment in 1 week. Wound #5 Right,Posterior Lower Leg: Return Appointment in 1 week. Edema Control: Wound #2 Left,Distal Lower Leg: Support Garment  20-30 mm/Hg pressure to: Wound #5 Right,Posterior Lower Leg: Support Garment 20-30 mm/Hg pressure to: Additional Orders  / Instructions: Wound #2 Left,Distal Lower Leg: Increase protein intake. Wound #5 Right,Posterior Lower Leg: Lupinacci, Keyia D. ([id number]) Increase protein intake. After washing out the wound with saline I have recommended to continue with Santyl and I will await her culture report before putting her on any medication. She has several allergies and she will call me back with the only medication which she is able to tolerate. She will continue with compression stockings and also with elevation of the limb. I will see her back next week. Electronic Signature(s) Signed: 07/01/2015 4:39:35 PM By: [name], [name] Previous Signature: 07/01/2015 3:53:23 PM Version By: [name], [name] Entered By: [name] on 07/01/2015 16:39:35 Mcneal, Hareem D. ([id number]) -------------------------------------------------------------------------------- SuperBill Details Patient Name: EILENE, VOIGT D. Date of Service: 07/01/2015 Medical Record Number: [id number] Patient Account Number: [id number] Date of Birth/Sex: 06-19-1946 (69 y.o. 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 Facility Procedures CPT4 Code: [id number] Description: 99213 - WOUND CARE VISIT-LEV 3 EST PT Modifier: Quantity: 1 Physician Procedures CPT4: Description Modifier Quantity Code [id number] - WC PHYS LEVEL 3 - EST PT 1 ICD-10  Description Diagnosis E11.622 Type 2 diabetes mellitus with other skin ulcer L97.322 Non-pressure chronic ulcer of left ankle with fat layer exposed I89.0  Lymphedema, not elsewhere classified 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

## 2015-07-02 ENCOUNTER — Other Ambulatory Visit
Admission: RE | Admit: 2015-07-02 | Discharge: 2015-07-02 | Disposition: A | Discharge status: Home / Self Care | Payer: PPO | Source: Other Acute Inpatient Hospital | Attending: Surgery | Admitting: Surgery

## 2015-07-02 DIAGNOSIS — S81802A Unspecified open wound, left lower leg, initial encounter: Secondary | ICD-10-CM | POA: Diagnosis present

## 2015-07-02 DIAGNOSIS — X58XXXA Exposure to other specified factors, initial encounter: Secondary | ICD-10-CM | POA: Diagnosis not present

## 2015-07-07 ENCOUNTER — Encounter: Payer: PPO | Admitting: Surgery

## 2015-07-07 DIAGNOSIS — L97322 Non-pressure chronic ulcer of left ankle with fat layer exposed: Secondary | ICD-10-CM | POA: Diagnosis not present

## 2015-07-07 LAB — WOUND CULTURE

## 2015-07-08 NOTE — Progress Notes (Signed)
HARMONIE, VERRASTRO ([id number]) Visit Report for 07/07/2015 Chief Complaint Document Details Patient Name: [name], [name] 07/07/2015 3:00 Date of Service: PM Medical Record [id number] Number: Patient Account Number: [id number] 12/24/1945 (69 y.o. Treating RN: [name] Date of Birth/Sex: Female) Other Clinician: Primary [name] Physician: [facility], Delana Meyer Treating [name] Referring Physician: Glendon Axe Physician/Extender: Weeks in Treatment: 22 Information Obtained from: Patient Chief Complaint Patient presents to the wound [name] center for a consult due non healing wound 69 year old patient comes with a history of having a ulcer on the left lower extremity for the past 4 weeks. she says she's had swelling of both lower extremities for about a year after she started having prednisone. 02/07/2015 -- her vascular appointments obtained were in the first and third week of June. she is able to go to [place] and we will try and get her some earlier appointments. Other than that nothing else has changed in her management. Electronic Signature(s) Signed: 07/07/2015 3:39:43 PM By: [name], [name] Entered By: [name] on 07/07/2015 15:39:43 Mcclimans, ANNASTON UPHAM ([id number]) -------------------------------------------------------------------------------- HPI Details Patient Name: [name], [name] 07/07/2015 3:00 Date of Service: PM Medical Record [id number] Number: Patient Account Number: [id number] Apr 11, 1946 (69 y.o. Treating RN: [name] Date of Birth/Sex: Female) Other Clinician: Primary [name] Physician: [facility], Delana Meyer Treating Daschel Roughton Referring Physician: Glendon Axe Physician/Extender: Weeks in Treatment: 22 History of Present Illness HPI Description: 69 year old patient who is known to have diabetes mellitus type 2, chronic renal insufficiency, coronary artery disease, hypertension, hypercholesterolemia, temporal arteritis and inflammatory arthritiss also  has a history of having a hysterectomy and some orthopedic related surgeries. The ulcer on the left lower extremity started off as a blister and then. Got progressively worse. She does not have any fever or chills and has not had any recent surgical intervention for this. Her last hemoglobin A1c was 10.1 in September 2015. She has been recently put on doxycycline by her PCP. She is now also allergic to doxycycline and was this was changed over to Keflex. 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 She does see a cardiologist and also takes a diuretic. 02/07/2015 her arterial and venous duplex studies to be done have dates been given as the first and third week of June. This is at [facility]. We are going to try and get early appointments at [facility]. other than that nothing has changed in her management. 02/14/2015 -- we have been able to get her an appointment in [facility] on May 20 which is much earlier than her previous ones at [place]. She continues with her prednisone and her sugars are in the range of 150-200. 02/21/2015 We were able to get a vascular lab workup for her today and she is going to be there at 2:00 this afternoon. the swelling of her leg has gone down significantly but she still has some tenderness over the wounds. 02/28/2015 - She has had one of two vascular workups done, and this coming Tuesday has another, at [facility] [facility]. She continues to be on steroid medications. She has significant sensitivity in her left lower extremity and has pain suggestive of neuropathic pain and I have asked her to address this with her primary [name] physician. 03/07/2015 -- The patient saw Dr. Lucky Cowboy for a consultation and he has had her arteries are okay but she has 2 incompetent veins on the  left lower extremity and he is going to set her up for surgery.  Official report is awaited. Addendum: Official reports are now available and on 03/04/2015. She was seen and lower extremity venous duplex exam was done. There was reflux present within the left greater saphenous vein below the knee and also the left small saphenous vein. Arterial duplex showed normal triphasic waveforms throughout the left lower extremity without any significant stenosis. Her ABIs were noncompressible bilaterally but a waveforms were normal and a digital pressures were normal bilaterally consistent with no significant arterial insufficiency. He has recommended endovenous ablation of both the left small saphenous and the left great saphenous vein. This would still be scheduled later. 03/14/2015 -- she has heard back from the vascular office and has surgery scheduled for sometime in July. [name] ([id number]) Her rheumatologist has decreased her prednisone dosage but she still on it. She has also had cataract surgery in her right eye recently this week. 03/20/2015 - No new complaints today. Pain improved. No fever or chills. Tolerating 2 layer compression. 04/14/2015 -- she was doing very well today she went off on vacation and now her edema has increased markedly the ulceration is bigger and her diabetes is not under control. 04/21/2015 -- I spoke to her PCP Dr. Candiss Norse and discussed the management which would include being seen by a general surgeon for debridement and taking multiple punch biopsies which would help in establishing the diagnosis of this is a vasculitis. She is agreeable about this and will set her up for the procedure with Dr. Tamala Julian at [facility]. She was seen by the surgeon Dr. Jamal Collin. 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 She is scheduled to have laser ablation done next week.Pt was sent here for possible biopsy to look for  vasculitis. Feel it would be better to wait after laser ablation is completed- the ulcer may heal fully and biopsy may not be necessary 04/29/2015 -- she had the venous ablation done by Dr. Lucky Cowboy last Friday and we do not have any notes yet. She is doing fine otherwise. 05/06/2015 --Review of her recent vascular intervention shows that she was seen by Dr. Lucky Cowboy on 04/29/2015. The follow-up duplex which was done showed that both the great saphenous vein and the small saphenous vein remained patent with reflux consistent with an unsuccessful ablation. He has rescheduled her for another the endovenous ablation to be done in about 4 weekso time. 05/13/2015 -- he was seen by her surgeon Dr. Jamal Collin who asked her to continue with conservative therapy and he would speak to Dr. Lucky Cowboy about her management. Dr. Lucky Cowboy is going to schedule her surgery in the middle of August for a repeat endovenous ablation. Her pus culture from last week has grown : [place] her noted her sensitivity report but due to her multiple allergies I had tried clindamycin and she developed a rash with this too. She has been prescribed and anti-buttocks in the ER and is has it at home and she will let is know what she is going to be taking. 05/20/2015 -- she has developed a small spot on her right lower extremity but besides that it is not a full fledged ulceration. She did not get to see Dr. Lucky Cowboy last week and hopefully she will see him in the near future. 05/27/2015 -she is still awaiting her  appointment with Dr.Dew and her vascular procedure is not scheduled until August 19. She will be seeing her PCP tomorrow and I have asked her to convey our discussion so that she is aware that debridement has not been done yet. 06/03/2015 -- was seen by her rheumatologist Dr. Dorthula Matas, who has been treating her for temporal arteritis and  in his note has mentioned the possibility of vasculitis or pyoderma gangrenosum. He is lowering her prednisone to 12-1/2 mg for 1 month and then 10 mg per the next month. I will again make an attempt to speak to her PCP Dr. Candiss Norse and her surgeon Dr. 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 06/17/2015 -- Dr.Dew did her vascular procedure last week and a follow-up venous ultrasound shows good resolution of the veins as per the patient's history. He is to see her back in 2 weeks. EZELL, MELIKIAN ([id number]) 07/07/2015. -- the patient has had a heavy growth of Proteus mirabilis and Enterococcus faecalis. These are sensitive to several drugs but the problem is she has allergies to all of these and hence I would like her to see Dr. Ola Spurr for this. She is also due to see Dr. Lucky Cowboy tomorrow and I will discussed the management with him including debridement under anesthesia and possible biopsies. Electronic Signature(s) Signed: 07/07/2015 3:39:53 PM By: [name], [name] Previous Signature: 07/07/2015 3:39:03 PM Version By: [name], [name] Entered By: [name] on 07/07/2015 15:39:53 Ruggiero, ETHAL GOTAY ([id number]) -------------------------------------------------------------------------------- Physical Exam Details Patient Name: KIEANNA, ROLLO D. 07/07/2015 3:00 Date of Service: PM Medical Record [id number] Number: Patient Account Number: [id number] 03/25/1946 (69 y.o. Treating RN: [name] Date of Birth/Sex: Female) Other Clinician: Primary [name] Physician: [facility], Delana Meyer Treating [name] Referring Physician: Glendon Axe Physician/Extender: Weeks in Treatment: 22 Constitutional . Pulse regular. Respirations normal and unlabored. Afebrile. . Eyes Nonicteric. Reactive to light. Ears, Nose, Mouth, and Throat Lips, teeth, and gums WNL.Marland Kitchen Moist mucosa without lesions  . Neck supple and nontender. No palpable supraclavicular or cervical adenopathy. Normal sized without goiter. Respiratory WNL. No retractions.. Cardiovascular Pedal Pulses WNL. she continues to have edema of the left lower extremity.. Lymphatic No adneopathy. No adenopathy. No adenopathy. Musculoskeletal Adexa without tenderness or enlargement.. Digits and nails w/o clubbing, cyanosis, infection, petechiae, ischemia, or inflammatory conditions.. Integumentary (Hair, Skin) No suspicious lesions. No crepitus or fluctuance. No peri-wound warmth or erythema. No masses.Marland Kitchen Psychiatric Judgement and insight Intact.. No evidence of depression, anxiety, or agitation.. Notes The area at about 12:00 where she had a pocket of pus the other day now has an opening just above the original ulceration. With a skinny probe this is undermining superiorly and has quite a large pocket for approximately 3 cm. There is purulent drainage. Electronic Signature(s) Signed: 07/07/2015 3:40:56 PM By: [name], [name] Entered By: [name] on 07/07/2015 15:40:56 Krage, SRAVYA GRISSOM ([id number]) -------------------------------------------------------------------------------- Physician Orders Details Patient Name: YULISSA, NEEDHAM D. 07/07/2015 3:00 Date of Service: PM Medical Record [id number] Number: Patient Account Number: [id number] 02-Mar-1946 (69 y.o. Treating RN: [name] Date of Birth/Sex: Female) Other Clinician: Primary [name] Physician: [facility], Delana Meyer Treating [name] Referring Physician: Glendon Axe Physician/Extender: Suella [place] in Treatment: 22 Verbal / Phone Orders: Yes Clinician: [name] Read Back and Verified: Yes Diagnosis Coding Wound Cleansing Wound #2 Left,Distal Lower Leg o Clean wound with Normal Saline. Wound #5 Right,Posterior Lower Leg o  Clean wound with Normal Saline. Anesthetic Wound #2 Left,Distal Lower Leg o Topical Lidocaine 4% cream applied to wound bed  prior to debridement Wound #5 Right,Posterior Lower Leg o Topical Lidocaine 4% cream applied to wound bed prior to debridement Primary Wound Dressing Wound #2 Left,Distal Lower Leg o Santyl Ointment Wound #5 Right,Posterior Lower Leg o Other: - betadine paint Secondary Dressing Wound #2 Left,Distal Lower Leg o ABD and Kerlix/Conform Wound #5 Right,Posterior Lower Leg o Boardered Foam Dressing Dressing Change Frequency Wound #2 Left,Distal Lower Leg o Change dressing every day. Wound #5 Right,Posterior Lower Leg o Change dressing every day. YERALDINE, FORNEY ([id number]) Follow-up Appointments Wound #2 Left,Distal Lower Leg o Return Appointment in 1 week. Wound #5 Right,Posterior Lower Leg o Return Appointment in 1 week. Edema Control Wound #2 Left,Distal Lower Leg o Support Garment 20-30 mm/Hg pressure to: Wound #5 Right,Posterior Lower Leg o Support Garment 20-30 mm/Hg pressure to: Additional Orders / Instructions Wound #2 Left,Distal Lower Leg o Increase protein intake. Wound #5 Right,Posterior Lower Leg o Increase protein intake. Electronic Signature(s) Signed: 07/07/2015 4:01:57 PM By: [name], [name] Signed: 07/07/2015 4:49:24 PM By: [name] RN, [name], [name], [name] Entered By: [name], RN, [name], Kim on 07/07/2015 15:35:56 Gisler, ANAKA BEAZER ([id number]) -------------------------------------------------------------------------------- Problem List Details Patient Name: AARTHI, UYENO 07/07/2015 3:00 Date of Service: PM Medical Record [id number] Number: Patient Account Number: [id number] 1946/07/06 (69 y.o. 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 ([id number]) Entered By: [name] on 07/07/2015 15:39:36 Josephson, Marifer D. ([id number]) -------------------------------------------------------------------------------- Progress Note Details Patient Name: MAKINZE, JANI D. 07/07/2015 3:00 Date of Service: PM Medical Record [id number] Number: Patient Account Number: [id number] 06-19-1946 (69 y.o. Treating RN: [name] Date of Birth/Sex: Female) Other Clinician: Primary [name] Physician: [facility], Delana Meyer Treating [name] Referring Physician: Glendon Axe Physician/Extender: Suella Grove in Treatment: 22 Subjective Chief Complaint Information obtained from Patient Patient presents to the wound [name] center for a consult due non healing wound 69 year old patient comes with a history of having a ulcer on the left lower extremity for the past 4 weeks. she says she's had swelling of both lower extremities for about a year after she started having prednisone. 02/07/2015 -- her vascular appointments obtained were in the first and third week of June. she is able to go to [place] and we will try and get her some earlier appointments. Other than that nothing else has changed in her management. History of Present Illness (HPI) 69 year old patient who is known to  have diabetes mellitus type 2, chronic renal insufficiency, coronary artery disease, hypertension, hypercholesterolemia, temporal arteritis and inflammatory arthritiss also has a history of having a hysterectomy and some orthopedic related surgeries. The ulcer  on the left lower extremity started off as a blister and then. Got progressively worse. She does not have any fever or chills and has not had any recent surgical intervention for this. Her last hemoglobin A1c was 10.1 in September 2015. She has been recently put on doxycycline by her PCP. She is now also allergic to doxycycline and was this was changed over to Keflex. 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 She does see a cardiologist and also takes a diuretic. 02/07/2015 her arterial and venous duplex studies to be done have dates been given as the first and third week of June. This is at [facility]. We are going to try and get early appointments at [facility]. other than that nothing has changed in her management. 02/14/2015 -- we have been able to get her an appointment in [facility] on May 20 which is much earlier than her previous ones at [place]. She continues with her prednisone and her sugars are in the range of 150-200. 02/21/2015 We were able to get a vascular lab workup for her today and she is going to be there at 2:00 this afternoon. the swelling of her leg has gone down significantly but she still has some tenderness over the wounds. 02/28/2015 - She has had one of two vascular workups done, and this coming Tuesday has another, at [place] [facility]. She continues to be on steroid medications. She has significant sensitivity in her left lower extremity and has pain suggestive of neuropathic pain and I have asked her to address this with her primary [name] physician. 03/07/2015 -- The patient  saw Dr. Lucky Cowboy for a consultation and he has had her arteries are okay but she has 2 incompetent veins on the left lower extremity and he is going to set her up for surgery. Official report is RHAYA, COALE ([id number]) awaited. Addendum: Official reports are now available and on 03/04/2015. She was seen and lower extremity venous duplex exam was done. There was reflux present within the left greater saphenous vein below the knee and also the left small saphenous vein. Arterial duplex showed normal triphasic waveforms throughout the left lower extremity without any significant stenosis. Her ABIs were noncompressible bilaterally but a waveforms were normal and a digital pressures were normal bilaterally consistent with no significant arterial insufficiency. He has recommended endovenous ablation of both the left small saphenous and the left great saphenous vein. This would still be scheduled later. 03/14/2015 -- she has heard back from the vascular office and has surgery scheduled for sometime in July. Her rheumatologist has decreased her prednisone dosage but she still on it. She has also had cataract surgery in her right eye recently this week. 03/20/2015 - No new complaints today. Pain improved. No fever or chills. Tolerating 2 layer compression. 04/14/2015 -- she was doing very well today she went off on vacation and now her edema has increased markedly the ulceration is bigger and her diabetes is not under control. 04/21/2015 -- I spoke to her PCP Dr. Candiss Norse and discussed the management which would include being seen by a general surgeon for debridement and taking multiple punch biopsies which would help in establishing the diagnosis of this is a vasculitis. She is agreeable about this and will set her up for the procedure with Dr. Tamala Julian at [facility]. She was seen by the surgeon  Dr. Jamal Collin. 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 She is scheduled to have laser ablation done next week.Pt was sent here for possible biopsy to look for vasculitis. Feel it would be better to wait after laser ablation is completed- the ulcer may heal fully and biopsy may not be necessary 04/29/2015 -- she had the venous ablation done by Dr. Lucky Cowboy last Friday and we do not have any notes yet. She is doing fine otherwise. 05/06/2015 --Review of her recent vascular intervention shows that she was seen by Dr. Lucky Cowboy on 04/29/2015. The follow-up duplex which was done showed that both the great saphenous vein and the small saphenous vein remained patent with reflux consistent with an unsuccessful ablation. He has rescheduled her for another the endovenous ablation to be done in about 4 weeks time. 05/13/2015 -- he was seen by her surgeon Dr. Jamal Collin who asked her to continue with conservative therapy and he would speak to Dr. Lucky Cowboy about her management. Dr. Lucky Cowboy is going to schedule her surgery in the middle of August for a repeat endovenous ablation. Her pus culture from last week has grown : [place] her noted her sensitivity report but due to her multiple allergies I had tried clindamycin and she developed a rash with this too. She has been prescribed and anti-buttocks in the ER and is has it at home and she will let is know what she is going to be taking. 05/20/2015 -- she has developed a small spot on her right lower extremity but besides that it is not a full fledged ulceration. She did not get to see Dr. Lucky Cowboy last week and hopefully she will see him in the near future. 05/27/2015 -she is still awaiting her appointment with Dr.Dew and her vascular procedure is not scheduled until August 19. She will be seeing her PCP tomorrow and I have asked her to convey our discussion so that she is aware that  debridement has not been done yet. SALAH, BURLISON ([id number]) 06/03/2015 -- was seen by her rheumatologist Dr. 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 He is lowering her prednisone to 12-1/2 mg for 1 month and then 10 mg per the next month. I will again make an attempt to speak to her PCP Dr. Candiss Norse and her surgeon Dr. 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 06/17/2015 -- Dr.Dew did her vascular procedure last week and a follow-up venous ultrasound shows good resolution of the veins as per the patient's history. He is to see her back in 2 weeks. 07/07/2015. -- the patient has had a heavy growth of Proteus mirabilis and Enterococcus faecalis. These are sensitive to several drugs but the problem is she has allergies to all of these and hence I would like her to see Dr. Ola Spurr for this. She is also due to see Dr. Lucky Cowboy tomorrow and I will discussed the management with him including debridement under anesthesia and possible biopsies. Objective Constitutional Pulse regular. Respirations normal and unlabored. Afebrile. Vitals Time Taken: 3:15 PM, Height: 65 in, Weight: 248 lbs, BMI: 41.3, Temperature: 98.1 F, Pulse: 80 bpm, Respiratory Rate: 18 breaths/min, Blood Pressure: 137/57 mmHg. Eyes Nonicteric. Reactive to  light. Ears, Nose, Mouth, and Throat Lips, teeth, and gums WNL.Marland Kitchen Moist mucosa without lesions . Neck supple and nontender. No palpable supraclavicular or cervical adenopathy. Normal sized without goiter. Respiratory WNL. No retractions.. Cardiovascular Pedal Pulses WNL. she continues to have edema of the left lower extremity.. Lymphatic No adneopathy. No adenopathy. No adenopathy. Musculoskeletal Adexa without tenderness or enlargement.. Digits and nails  w/o clubbing, cyanosis, infection, petechiae, Samuelson, Katja D. ([id number]) ischemia, or inflammatory conditions.Marland Kitchen Psychiatric Judgement and insight Intact.. No evidence of depression, anxiety, or agitation.. General Notes: The area at about 12:00 where she had a pocket of pus the other day now has an opening just above the original ulceration. With a skinny probe this is undermining superiorly and has quite a large pocket for approximately 3 cm. There is purulent drainage. Integumentary (Hair, Skin) No suspicious lesions. No crepitus or fluctuance. No peri-wound warmth or erythema. No masses.. Wound #2 status is Open. Original cause of wound was Blister. The wound is located on the Left,Distal Lower Leg. The wound measures 8cm length x 5.3cm width x 0.4cm depth; 33.301cm^2 area and 13.32cm^3 volume. The wound is limited to skin breakdown. There is no tunneling or undermining noted. There is a medium amount of serosanguineous drainage noted. The wound margin is distinct with the outline attached to the wound base. There is medium (34-66%) red, pink granulation within the wound bed. There is a small (1-33%) amount of necrotic tissue within the wound bed including Adherent Slough. The periwound skin appearance exhibited: Scarring, Moist, Hemosiderin Staining. The periwound skin appearance did not exhibit: Callus, Crepitus, Excoriation, Fluctuance, Friable, Induration, Localized Edema, Rash, Dry/Scaly, Maceration, Atrophie Blanche, Cyanosis, Ecchymosis, Mottled, Pallor, Rubor, Erythema. Periwound temperature was noted as No Abnormality. The periwound has tenderness on palpation. Wound #5 status is Open. Original cause of wound was Gradually Appeared. The wound is located on the Right,Posterior Lower Leg. The wound measures 1.1cm length x 1cm width x 0.1cm depth; 0.864cm^2 area and 0.086cm^3 volume. The wound is limited to skin breakdown. There is a none present amount of drainage noted. The wound  margin is flat and intact. There is no granulation within the wound bed. There is a large (67-100%) amount of necrotic tissue within the wound bed including Eschar. The periwound skin appearance exhibited: Dry/Scaly, Hemosiderin Staining. The periwound skin appearance did not exhibit: Callus, Crepitus, Excoriation, Fluctuance, Friable, Induration, Localized Edema, Rash, Scarring, Maceration, Moist, Atrophie Blanche, Cyanosis, Ecchymosis, Mottled, Pallor, Rubor, Erythema. Periwound temperature was noted as No Abnormality. The periwound has tenderness on palpation. 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 ([id number]) There is purulent drainage coming through the opening above the original ulceration. I would like her to see Dr. Ola Spurr as soon as possible as she has Proteus and enterococcus growing but she has several drug allergies though the culture and sensitivity has several options. We'll continue with Santyl to be applied to the main wound and she will get be getting to the other leg. Hopefully we will be able to discuss with her surgeon Dr. Lucky Cowboy and see if she can have  some debridement and biopsies under anesthesia. Plan Wound Cleansing: Wound #2 Left,Distal Lower Leg: Clean wound with Normal Saline. Wound #5 Right,Posterior Lower Leg: Clean wound with Normal Saline. Anesthetic: Wound #2 Left,Distal Lower Leg: Topical Lidocaine 4% cream applied to wound bed prior to debridement Wound #5 Right,Posterior Lower Leg: Topical  Lidocaine 4% cream applied to wound bed prior to debridement Primary Wound Dressing: Wound #2 Left,Distal Lower Leg: Santyl Ointment Wound #5 Right,Posterior Lower Leg: Other: - betadine paint Secondary Dressing: Wound #2 Left,Distal Lower Leg: ABD and Kerlix/Conform Wound #5 Right,Posterior Lower Leg: Boardered Foam Dressing Dressing Change Frequency: Wound #2 Left,Distal Lower Leg: Change dressing every day. Wound #5 Right,Posterior Lower Leg: Change dressing every day. Follow-up Appointments: Wound #2 Left,Distal Lower Leg: Return Appointment in 1 week. Wound #5 Right,Posterior Lower Leg: Return Appointment in 1 week. Edema Control: Wound #2 Left,Distal Lower Leg: Crossland, Wyona D. ([id number]) Support Garment 20-30 mm/Hg pressure to: Wound #5 Right,Posterior Lower Leg: Support Garment 20-30 mm/Hg pressure to: Additional Orders / Instructions: Wound #2 Left,Distal Lower Leg: Increase protein intake. Wound #5 Right,Posterior Lower Leg: Increase protein intake. There is purulent drainage coming through the opening above the original ulceration. I would like her to see Dr. Ola Spurr as soon as possible as she has Proteus and enterococcus growing but she has several drug allergies though the culture and sensitivity has several options. We'll continue with Santyl to be applied to the main wound and she will get be getting to the other leg. Hopefully we will be able to discuss with her surgeon Dr. Lucky Cowboy and see if she can have some debridement and biopsies under anesthesia. Electronic Signature(s) Signed: 07/07/2015 3:42:47 PM By: [name], [name] Entered By: [name] on 07/07/2015 15:42:47 Liberman, Maika D. ([id number]) -------------------------------------------------------------------------------- SuperBill Details Patient Name: ZOE, GOONAN D. Date of Service: 07/07/2015 Medical Record Number: [id number] Patient Account Number: [id number] Date of Birth/Sex: 04-23-1946  (68 y.o. 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 L03.116 Cellulitis of left lower limb Facility Procedures CPT4 Code: [id number] Description: 99213 - WOUND [name] VISIT-LEV 3 EST PT Modifier: Quantity: 1 Physician Procedures CPT4: Description Modifier Quantity Code [id number] 99213 - WC PHYS LEVEL 3 - EST PT 1 ICD-10 Description Diagnosis E11.622 Type 2 diabetes mellitus with other skin ulcer L03.116 Cellulitis of left lower limb I83.222 Varicose veins of left lower extremity  with both ulcer of calf and inflammation Electronic Signature(s) Signed: 07/07/2015 3:43:07 PM By: [name], [name] Entered By: [name] on 07/07/2015 15:43:06

## 2015-07-08 NOTE — Progress Notes (Signed)
SAHAANA, WEITMAN ([id number]) Visit Report for 07/07/2015 Arrival Information Details Patient Name: [name], [name] Date of Service: 07/07/2015 3:00 PM Medical Record Number: [id number] Patient Account Number: [id number] Date of Birth/Sex: [birth year] (69 y.o. Female) Treating RN: [name] Primary Care Physician: Glendon Axe Other Clinician: Referring Physician: Glendon Axe Treating Physician/Extender: Frann Rider in Treatment: 57 Visit Information History Since Last Visit Added or deleted any medications: No Patient Arrived: Ambulatory Any new allergies or adverse reactions: No Arrival Time: 15:14 Had a fall or experienced change in No Accompanied By: self activities of daily living that may affect Transfer Assistance: None risk of falls: Patient Identification Verified: Yes Signs or symptoms of abuse/neglect since last No Secondary Verification Process Yes visito Completed: Hospitalized since last visit: No Patient Has Alerts: Yes Has Dressing in Place as Prescribed: Yes Patient Alerts: Patient on Blood Pain Present Now: No Thinner Electronic Signature(s) Signed: 07/07/2015 4:49:24 PM By: [name], RN, [name], [name], [name] Entered By: [name], RN, [name], Kim on 07/07/2015 15:15:18 Guin, Fredda DMarland Kitchen ([id number]) -------------------------------------------------------------------------------- Clinic Level of Care Assessment Details Patient Name: SHAUNIECE, KWAN D. Date of Service: 07/07/2015 3:00 PM Medical Record Number: [id number] Patient Account Number: [id number] Date of Birth/Sex: [birth year] (69 y.o. Female) Treating RN: [name] Primary Care Physician: Glendon Axe Other Clinician: Referring Physician: Glendon Axe Treating Physician/Extender: Frann Rider in Treatment: 22 Clinic Level of Care Assessment Items TOOL 2 Quantity Score []  - Use when only an EandM is performed on the INITIAL visit 0 ASSESSMENTS - Nursing Assessment / Reassessment []  - General  Physical Exam (combine w/ comprehensive assessment (listed just 0 below) when performed on new pt. evals) X - Comprehensive Assessment (HX, ROS, Risk Assessments, Wounds Hx, etc.) 1 25 ASSESSMENTS - Wound and Skin Assessment / Reassessment X - Simple Wound Assessment / Reassessment - one wound 1 5 []  - Complex Wound Assessment / Reassessment - multiple wounds 0 []  - Dermatologic / Skin Assessment (not related to wound area) 0 ASSESSMENTS - Ostomy and/or Continence Assessment and Care []  - Incontinence Assessment and Management 0 []  - Ostomy Care Assessment and Management (repouching, etc.) 0 PROCESS - Coordination of Care X - Simple Patient / Family Education for ongoing care 1 15 []  - Complex (extensive) Patient / Family Education for ongoing care 0 X - Staff obtains Programmer, systems, Records, Test Results / Process Orders 1 10 []  - Staff telephones HHA, Nursing Homes / Clarify orders / etc 0 []  - Routine Transfer to another Facility (non-emergent condition) 0 []  - Routine Hospital Admission (non-emergent condition) 0 []  - New Admissions / Biomedical engineer / Ordering NPWT, Apligraf, etc. 0 []  - Emergency Hospital Admission (emergent condition) 0 X - Simple Discharge Coordination 1 10 Mciver, Mahlia D. ([id number]) []  - Complex (extensive) Discharge Coordination 0 PROCESS - Special Needs []  - Pediatric / Minor Patient Management 0 []  - Isolation Patient Management 0 []  - Hearing / Language / Visual special needs 0 []  - Assessment of Community assistance (transportation, D/C planning, etc.) 0 []  - Additional assistance / Altered mentation 0 []  - Support Surface(s) Assessment (bed, cushion, seat, etc.) 0 INTERVENTIONS - Wound Cleansing / Measurement X - Wound Imaging (photographs - any number of wounds) 1 5 []  - Wound Tracing (instead of photographs) 0 X - Simple Wound Measurement - one wound 1 5 []  - Complex Wound Measurement - multiple wounds 0 X - Simple Wound Cleansing - one wound 1  5 []  - Complex Wound Cleansing - multiple wounds 0  INTERVENTIONS - Wound Dressings []  - Small Wound Dressing one or multiple wounds 0 X - Medium Wound Dressing one or multiple wounds 1 15 []  - Large Wound Dressing one or multiple wounds 0 []  - Application of Medications - injection 0 INTERVENTIONS - Miscellaneous []  - External ear exam 0 []  - Specimen Collection (cultures, biopsies, blood, body fluids, etc.) 0 []  - Specimen(s) / Culture(s) sent or taken to Lab for analysis 0 []  - Patient Transfer (multiple staff / [name]) 0 []  - Simple Staple / Suture removal (25 or less) 0 []  - Complex Staple / Suture removal (26 or more) 0 Wildermuth, Tyrene D. ([id number]) []  - Hypo / Hyperglycemic Management (close monitor of Blood Glucose) 0 []  - Ankle / Brachial Index (ABI) - do not check if billed separately 0 Has the patient been seen at the hospital within the last three years: Yes Total Score: 95 Level Of Care: New/Established - Level 3 Electronic Signature(s) Signed: 07/07/2015 4:49:24 PM By: [name], RN, [name], [name], [name] Entered By: [name], RN, [name], Kim on 07/07/2015 15:36:39 Tauer, Tia Masker ([id number]) -------------------------------------------------------------------------------- Encounter Discharge Information Details Patient Name: GULIANNA, HORNSBY D. Date of Service: 07/07/2015 3:00 PM Medical Record Number: [id number] Patient Account Number: [id number] Date of Birth/Sex: 08/10/1946 (69 y.o. Female) Treating RN: [name] Primary Care Physician: Glendon Axe Other Clinician: Referring Physician: Glendon Axe Treating Physician/Extender: Frann Rider in Treatment: 22 Encounter Discharge Information Items Discharge Pain Level: 0 Discharge Condition: Stable Ambulatory Status: Ambulatory Discharge Destination: Home Transportation: Private Auto Accompanied By: self Schedule Follow-up Appointment: Yes Medication Reconciliation completed and provided to  Patient/Care Yes Dell Briner: Provided on Clinical Summary of Care: 07/07/2015 Form Type Recipient Paper Patient LM Electronic Signature(s) Signed: 07/07/2015 4:49:24 PM By: [name] RN, [name], [name], [name] Previous Signature: 07/07/2015 3:37:13 PM Version By: Ruthine Dose Entered By: [name] RN, [name], Kim on 07/07/2015 15:37:48 Borcherding, Gracielynn DMarland Kitchen ([id number]) -------------------------------------------------------------------------------- Lower Extremity Assessment Details Patient Name: AUDELIA, KNAPE D. Date of Service: 07/07/2015 3:00 PM Medical Record Number: [id number] Patient Account Number: [id number] Date of Birth/Sex: Jun 20, 1946 (68 y.o. Female) Treating RN: [name] Primary Care Physician: Glendon Axe Other Clinician: Referring Physician: Glendon Axe Treating Physician/Extender: Frann Rider in Treatment: 22 Edema Assessment Assessed: [Left: No] [Right: No] Edema: [Left: No] [Right: No] Vascular Assessment Pulses: Posterior Tibial Dorsalis Pedis Palpable: [Left:Yes] [Right:Yes] Extremity colors, hair growth, and conditions: Extremity Color: [Left:Mottled] [Right:Hyperpigmented] Hair Growth on Extremity: [Left:No] [Right:No] Temperature of Extremity: [Left:Warm] [Right:Warm] Capillary Refill: [Left:< 3 seconds] [Right:< 3 seconds] Toe Nail Assessment Left: Right: Thick: No No Discolored: No No Deformed: No No Improper Length and Hygiene: No No Electronic Signature(s) Signed: 07/07/2015 4:49:24 PM By: [name], RN, [name], [name], [name] Entered By: [name], RN, [name], Kim on 07/07/2015 15:20:13 Patella, Brette D. ([id number]) -------------------------------------------------------------------------------- Multi Wound Chart Details Patient Name: [name] Date of Service: 07/07/2015 3:00 PM Medical Record Number: [id number] Patient Account Number: [id number] Date of Birth/Sex: 1946-04-24 (68 y.o. Female) Treating RN: [name] Primary Care Physician: Glendon Axe Other  Clinician: Referring Physician: Glendon Axe Treating Physician/Extender: Frann Rider in Treatment: 22 Vital Signs Height(in): 65 Pulse(bpm): 80 Weight(lbs): 248 Blood Pressure 137/57 (mmHg): Body Mass Index(BMI): 41 Temperature(F): 98.1 Respiratory Rate 18 (breaths/min): Photos: [2:No Photos] [5:No Photos] [N/A:N/A] Wound Location: [2:Left Lower Leg - Distal Right Lower Leg -] [5:Posterior] [N/A:N/A] Wounding Event: [2:Blister] [5:Gradually Appeared] [N/A:N/A] Primary Etiology: [2:Venous Leg Ulcer] [5:Venous Leg Ulcer] [N/A:N/A] Comorbid History: [2:Cataracts, Asthma, Coronary Artery Disease,  Coronary Artery Disease, Hypertension, Type II Diabetes, Osteoarthritis, Diabetes, Osteoarthritis, Neuropathy] [5:Cataracts, Asthma, Hypertension, Type II Neuropathy] [N/A:N/A] Date Acquired: [2:12/30/2014] [5:04/29/2015] [N/A:N/A] Weeks of Treatment: [2:22] [5:6] [N/A:N/A] Wound Status: [2:Open] [5:Open] [N/A:N/A] Clustered Wound: [2:Yes] [5:No] [N/A:N/A] Measurements L x W x D 8x5.3x0.4 [5:1.1x1x0.1] [N/A:N/A] (cm) Area (cm) : [2:33.301] [5:0.864] [N/A:N/A] Volume (cm) : [2:13.32] [5:0.086] [N/A:N/A] % Reduction in Area: [2:-4610.20%] [5:-359.60%] [N/A:N/A] % Reduction in Volume: -18660.60% [5:-352.60%] [N/A:N/A] Classification: [2:Full Thickness Without Exposed Support Structures] [5:Unclassifiable] [N/A:N/A] HBO Classification: [2:Grade 1] [5:Grade 1] [N/A:N/A] Exudate Amount: [2:Medium] [5:None Present] [N/A:N/A] Exudate Type: [2:Serosanguineous] [5:N/A] [N/A:N/A] Exudate Color: [2:red, brown] [5:N/A] [N/A:N/A] Wound Margin: [2:Distinct, outline attached Flat and Intact] [N/A:N/A] Granulation Amount: [2:Medium (34-66%)] [5:None Present (0%)] [N/A:N/A] Granulation Quality: [5:N/A] [N/A:N/A] Red, Pink, Hyper- granulation Necrotic Amount: Small (1-33%) Large (67-100%) N/A Necrotic Tissue: Adherent Slough Eschar N/A Exposed Structures: Fascia: No Fascia: No N/A Fat:  No Fat: No Tendon: No Tendon: No Muscle: No Muscle: No Joint: No Joint: No Bone: No Bone: No Limited to Skin Limited to Skin Breakdown Breakdown Epithelialization: Small (1-33%) None N/A Periwound Skin Texture: Scarring: Yes Edema: No N/A Edema: No Excoriation: No Excoriation: No Induration: No Induration: No Callus: No Callus: No Crepitus: No Crepitus: No Fluctuance: No Fluctuance: No Friable: No Friable: No Rash: No Rash: No Scarring: No Periwound Skin Moist: Yes Dry/Scaly: Yes N/A Moisture: Maceration: No Maceration: No Dry/Scaly: No Moist: No Periwound Skin Color: Hemosiderin Staining: Yes Hemosiderin Staining: Yes N/A Atrophie Blanche: No Atrophie Blanche: No Cyanosis: No Cyanosis: No Ecchymosis: No Ecchymosis: No Erythema: No Erythema: No Mottled: No Mottled: No Pallor: No Pallor: No Rubor: No Rubor: No Temperature: No Abnormality No Abnormality N/A Tenderness on Yes Yes N/A Palpation: Wound Preparation: Ulcer Cleansing: Ulcer Cleansing: N/A Rinsed/Irrigated with Rinsed/Irrigated with Saline Saline Topical Anesthetic Topical Anesthetic Applied: Other: lidocaine Applied: None 4% Treatment Notes Electronic Signature(s) Signed: 07/07/2015 4:49:24 PM By: [name], RN, [name], [name], [name] Entered By: [name], RN, [name], Kim on 07/07/2015 15:22:52 Gillyard, BERTINE SCHLOTTMAN ([id number]) LIZZET, HENDLEY ([id number]) -------------------------------------------------------------------------------- Multi-Disciplinary Care Plan Details Patient Name: LYGIA, OLAES. Date of Service: 07/07/2015 3:00 PM Medical Record Number: [id number] Patient Account Number: [id number] Date of Birth/Sex: January 21, 1946 (68 y.o. Female) Treating RN: [name] Primary Care Physician: Glendon Axe Other Clinician: Referring Physician: Glendon Axe Treating Physician/Extender: Frann Rider in Treatment: 22 Active Inactive Orientation to the Wound Care Program Nursing  Diagnoses: Knowledge deficit related to the wound healing center program Goals: Patient/caregiver will verbalize understanding of the [facility] Date Initiated: 01/31/2015 Goal Status: Active Interventions: Provide education on orientation to the wound center Notes: Venous Leg Ulcer Nursing Diagnoses: Potential for venous Insuffiency (use before diagnosis confirmed) Goals: Non-invasive venous studies are completed as ordered Date Initiated: 01/31/2015 Goal Status: Active Patient/caregiver will verbalize understanding of disease process and disease management Date Initiated: 01/31/2015 Goal Status: Active Interventions: Assess peripheral edema status every visit. Notes: Wound/Skin Impairment Nursing Diagnoses: Impaired tissue integrity Knowledge deficit related to smoking impact on wound healing Borman, Roxanna D. ([id number]) Goals: Patient/caregiver will verbalize understanding of skin care regimen Date Initiated: 01/31/2015 Goal Status: Active Ulcer/skin breakdown will heal within 14 weeks Date Initiated: 01/31/2015 Goal Status: Active Interventions: Assess ulceration(s) every visit Notes: Electronic Signature(s) Signed: 07/07/2015 4:49:24 PM By: [name], RN, [name], [name], [name] Entered By: [name], RN, [name], Kim on 07/07/2015 15:22:45 Grudzien, Tia Masker ([id number]) -------------------------------------------------------------------------------- Patient/Caregiver Education Details Patient Name: JAKEIRA, SEEMAN D. Date of Service: 07/07/2015 3:00 PM Medical Record Number:  [id number] Patient Account Number: [id number] Date of Birth/Gender: 1946-07-20 (68 y.o. Female) Treating RN: [name] Primary Care Physician: Glendon Axe Other Clinician: Referring Physician: Glendon Axe Treating Physician/Extender: Frann Rider in Treatment: 22 Education Assessment Education Provided To: Patient Education Topics Provided Wound/Skin Impairment: Handouts: Caring for Your  Ulcer, Other: conti nue wound care as prescribed Electronic Signature(s) Signed: 07/07/2015 4:49:24 PM By: [name], RN, [name], [name], [name] Entered By: [name], RN, [name], Kim on 07/07/2015 15:38:10 Gassert, Tia Masker ([id number]) -------------------------------------------------------------------------------- Wound Assessment Details Patient Name: ALEERA, GILCREASE D. Date of Service: 07/07/2015 3:00 PM Medical Record Number: [id number] Patient Account Number: [id number] Date of Birth/Sex: 06-30-1946 (68 y.o. Female) Treating RN: [name] Primary Care Physician: Glendon Axe Other Clinician: Referring Physician: Glendon Axe Treating Physician/Extender: Frann Rider in Treatment: 22 Wound Status Wound Number: 2 Primary Venous Leg Ulcer Etiology: Wound Location: Left Lower Leg - Distal Wound Open Wounding Event: Blister Status: Date Acquired: 12/30/2014 Comorbid Cataracts, Asthma, Coronary Artery Weeks Of Treatment: 22 History: Disease, Hypertension, Type II Clustered Wound: Yes Diabetes, Osteoarthritis, Neuropathy Photos Photo Uploaded By: [name], RN, [name], Kim on 07/07/2015 15:51:42 Wound Measurements Length: (cm) 8 Width: (cm) 5.3 Depth: (cm) 0.4 Area: (cm) 33.301 Volume: (cm) 13.32 % Reduction in Area: -4610.2% % Reduction in Volume: -18660.6% Epithelialization: Small (1-33%) Tunneling: No Undermining: No Wound Description Full Thickness Without Exposed Classification: Support Structures Diabetic Severity Grade 1 (Wagner): JOHNNI, WUNSCHEL ([id number]) Foul Odor After Cleansing: No Wound Margin: Distinct, outline attached Exudate Amount: Medium Exudate Type: Serosanguineous Exudate Color: red, brown Wound Bed Granulation Amount: Medium (34-66%) Exposed Structure Granulation Quality: Red, Pink, Hyper-granulation Fascia Exposed: No Necrotic Amount: Small (1-33%) Fat Layer Exposed: No Necrotic Quality: Adherent Slough Tendon Exposed: No Muscle Exposed: No Joint  Exposed: No Bone Exposed: No Limited to Skin Breakdown Periwound Skin Texture Texture Color No Abnormalities Noted: No No Abnormalities Noted: No Callus: No Atrophie Blanche: No Crepitus: No Cyanosis: No Excoriation: No Ecchymosis: No Fluctuance: No Erythema: No Friable: No Hemosiderin Staining: Yes Induration: No Mottled: No Localized Edema: No Pallor: No Rash: No Rubor: No Scarring: Yes Temperature / Pain Moisture Temperature: No Abnormality No Abnormalities Noted: No Tenderness on Palpation: Yes Dry / Scaly: No Maceration: No Moist: Yes Wound Preparation Ulcer Cleansing: Rinsed/Irrigated with Saline Topical Anesthetic Applied: Other: lidocaine 4%, Treatment Notes Wound #2 (Left, Distal Lower Leg) 1. Cleansed with: Clean wound with Normal Saline 2. Anesthetic Topical Lidocaine 4% cream to wound bed prior to debridement 4. Dressing Applied: Santyl Ointment 5. Secondary Dressing Applied DANAYAH, SMYRE ([id number]) ABD Pad Notes betadine paint to the right posterior Electronic Signature(s) Signed: 07/07/2015 4:49:24 PM By: [name], RN, [name], [name], [name] Entered By: [name], RN, [name], Kim on 07/07/2015 15:21:22 Amrein, Tia Masker ([id number]) -------------------------------------------------------------------------------- Wound Assessment Details Patient Name: MISHIKA, FLIPPEN D. Date of Service: 07/07/2015 3:00 PM Medical Record Number: [id number] Patient Account Number: [id number] Date of Birth/Sex: 04/06/1946 (68 y.o. Female) Treating RN: [name] Primary Care Physician: Glendon Axe Other Clinician: Referring Physician: Glendon Axe Treating Physician/Extender: Frann Rider in Treatment: 22 Wound Status Wound Number: 5 Primary Venous Leg Ulcer Etiology: Wound Location: Right Lower Leg - Posterior Wound Open Wounding Event: Gradually Appeared Status: Date Acquired: 04/29/2015 Comorbid Cataracts, Asthma, Coronary Artery Weeks Of Treatment: 6 History:  Disease, Hypertension, Type II Clustered Wound: No Diabetes, Osteoarthritis, Neuropathy Photos Photo Uploaded By: [name], RN, [name], Kim on 07/07/2015 15:51:42 Wound Measurements Length: (cm) 1.1 Width: (cm) 1 Depth: (cm) 0.1 Area: (cm) 0.864  Volume: (cm) 0.086 % Reduction in Area: -359.6% % Reduction in Volume: -352.6% Epithelialization: None Wound Description Classification: Unclassifiable Diabetic Severity Earleen Newport): Grade 1 Wound Margin: Flat and Intact Exudate Amount: None Present Wound Bed Granulation Amount: None Present (0%) Exposed Structure Necrotic Amount: Large (67-100%) Fascia Exposed: No Necrotic Quality: Eschar Fat Layer Exposed: No Tendon Exposed: No Muscle Exposed: No Hebert, Morayo D. ([id number]) Joint Exposed: No Bone Exposed: No Limited to Skin Breakdown Periwound Skin Texture Texture Color No Abnormalities Noted: No No Abnormalities Noted: No Callus: No Atrophie Blanche: No Crepitus: No Cyanosis: No Excoriation: No Ecchymosis: No Fluctuance: No Erythema: No Friable: No Hemosiderin Staining: Yes Induration: No Mottled: No Localized Edema: No Pallor: No Rash: No Rubor: No Scarring: No Temperature / Pain Moisture Temperature: No Abnormality No Abnormalities Noted: No Tenderness on Palpation: Yes Dry / Scaly: Yes Maceration: No Moist: No Wound Preparation Ulcer Cleansing: Rinsed/Irrigated with Saline Topical Anesthetic Applied: None Treatment Notes Wound #5 (Right, Posterior Lower Leg) 1. Cleansed with: Clean wound with Normal Saline 5. Secondary Dressing Applied Bordered Foam Dressing Notes betadine paint to the right posterior Electronic Signature(s) Signed: 07/07/2015 4:49:24 PM By: [name], RN, [name], [name], [name] Entered By: [name], RN, [name], Kim on 07/07/2015 15:21:45 Economou, Tia Masker ([id number]) -------------------------------------------------------------------------------- Vitals Details Patient Name: [name] Date of  Service: 07/07/2015 3:00 PM Medical Record Number: [id number] Patient Account Number: [id number] Date of Birth/Sex: 04-Sep-1946 (68 y.o. Female) Treating RN: [name] Primary Care Physician: Glendon Axe Other Clinician: Referring Physician: Glendon Axe Treating Physician/Extender: Frann Rider in Treatment: 22 Vital Signs Time Taken: 15:15 Temperature (F): 98.1 Height (in): 65 Pulse (bpm): 80 Weight (lbs): 248 Respiratory Rate (breaths/min): 18 Body Mass Index (BMI): 41.3 Blood Pressure (mmHg): 137/57 Reference Range: 80 - 120 mg / dl Electronic Signature(s) Signed: 07/07/2015 4:49:24 PM By: [name], RN, [name], [name], [name] Entered By: [name], RN, [name], Kim on 07/07/2015 15:16:12

## 2015-07-14 ENCOUNTER — Encounter: Payer: PPO | Admitting: Surgery

## 2015-07-14 DIAGNOSIS — L97322 Non-pressure chronic ulcer of left ankle with fat layer exposed: Secondary | ICD-10-CM | POA: Diagnosis not present

## 2015-07-15 DIAGNOSIS — L97309 Non-pressure chronic ulcer of unspecified ankle with unspecified severity: Secondary | ICD-10-CM

## 2015-07-15 DIAGNOSIS — I83003 Varicose veins of unspecified lower extremity with ulcer of ankle: Secondary | ICD-10-CM | POA: Insufficient documentation

## 2015-07-15 NOTE — Progress Notes (Signed)
Kristin, [name] ([id number]) Visit Report for 07/14/2015 Chief Complaint Document Details Patient Name: Kristin, [name] 07/14/2015 1:00 Date of Service: PM Medical Record [id number] Number: Patient Account Number: [id number] 1947/07/[name] (69 y.o. Treating RN: [name] Date of Birth/Sex: Female) Other Clinician: Primary Care Physician: [facility], [name] Treating [name] Referring Physician: Glendon [name] Physician/Extender: [name] Information Obtained from: Patient Chief Complaint Patient presents to the wound care center for a consult due non healing wound 69 year old patient comes with a history of having a ulcer on the left lower extremity for the past 4 Kristin. she says she's had swelling of both lower extremities for about a year after she started having prednisone. 02/07/2015 -- her vascular appointments obtained were in the first and third week of June. she is able to go to [place] and we will try and get her some earlier appointments. Other than that nothing else has changed in her management. Electronic Signature(s) Signed: 07/14/2015 1:40:37 PM By: [name] Entered By: [name] on 07/14/2015 13:40:37 [name] ([id number]) -------------------------------------------------------------------------------- Debridement Details Patient Name: Kristin, HALLE [name] 07/14/2015 1:00 Date of Service: PM Medical Record [id number] Number: Patient Account Number: [id number] December 12, 1945 (69 y.o. Treating RN: [name] Date of Birth/Sex: Female) Other Clinician: Primary Care Physician: [facility], [name] Treating [name] Referring Physician: Glendon [name] Physician/Extender: [name] in Treatment: [name] Debridement Performed for Wound #2 Left,Distal Lower Leg Assessment: Performed By: Physician Kristin Patrick., MD Debridement: Debridement Pre-procedure Yes Verification/Time Out Taken: Start Time: 13:32 Pain Control: Other : lidocaine  4% Level: Skin/Subcutaneous Tissue Total Area Debrided (L x 6.4 (cm) x 5.2 (cm) = 33.28 (cm) W): Tissue and other Viable, Non-Viable, Exudate, Fibrin/Slough, Subcutaneous material debrided: Instrument: Curette Bleeding: Minimum Hemostasis Achieved: Pressure End Time: 13:38 Procedural Pain: 0 Post Procedural Pain: 0 Response to Treatment: Procedure was tolerated well Post Debridement Measurements of Total Wound Length: (cm) 6.4 Width: (cm) 5.2 Depth: (cm) 0.7 Volume: (cm) 18.297 Post Procedure Diagnosis Same as Pre-procedure Electronic Signature(s) Signed: 07/14/2015 1:40:30 PM By: [name] Signed: 07/14/2015 4:49:24 PM By: [name], [name] Previous Signature: 07/14/2015 1:40:12 PM Version By: [name] Entered By: [name] on 07/14/2015 13:40:30 [name] ([id number]) Kristin, [name] ([id number]) -------------------------------------------------------------------------------- HPI Details Patient Name: [name] 07/14/2015 1:00 Date of Service: PM Medical Record [id number] Number: Patient Account Number: [id number] August 09, 1946 (69 y.o. Treating RN: [name] Date of Birth/Sex: Female) Other Clinician: Primary Care Physician: [facility], [name] Treating [name] Referring Physician: Glendon [name] Physician/Extender: [name] History of Present Illness HPI Description: 69 year old patient who is known to have diabetes mellitus type 2, chronic renal insufficiency, coronary artery disease, hypertension, hypercholesterolemia, temporal arteritis and inflammatory arthritiss also has a history of having a hysterectomy and some orthopedic related surgeries. The ulcer on the left lower extremity started off as a blister and then. Got progressively worse. She does not have any fever or chills and has not had any recent surgical intervention for this. Her last hemoglobin A1c was 10.1 in September 2015. She has  been recently put on doxycycline by her PCP. She is now also allergic to doxycycline and was this was changed over to Keflex. 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 She does see a cardiologist and also takes a diuretic. 02/07/2015 her arterial and venous duplex  studies to be done have dates been given as the first and third week of June. This is at [facility]. We are going to try and get early appointments at [facility]. other than that nothing has changed in her management. 02/14/2015 -- we have been able to get her an appointment in [facility] on May 20 which is much earlier than her previous ones at [place]. She continues with her prednisone and her sugars are in the range of 150-200. 02/21/2015 We were able to get a vascular lab workup for her today and she is going to be there at 2:00 this afternoon. the swelling of her leg has gone down significantly but she still has some tenderness over the wounds. 02/28/2015 - She has had one of two vascular workups done, and this coming Tuesday has another, at [facility]. She continues to be on steroid medications. She has significant sensitivity in her left lower extremity and has pain suggestive of neuropathic pain and I have asked her to address this with her primary care physician. 03/07/2015 -- The patient saw Dr. Lucky Cowboy for a consultation and he has had her arteries are okay but she has 2 incompetent veins on the left lower extremity and he is going to set her up for surgery. Official report is awaited. Addendum: Official reports are now available and on 03/04/2015. She was seen and lower extremity venous duplex exam was done. There was reflux present within the left greater saphenous vein below the knee and also the left small saphenous vein. Arterial duplex showed normal triphasic waveforms  throughout the left lower extremity without any significant stenosis. Her ABIs were noncompressible bilaterally but a waveforms were normal and a digital pressures were normal bilaterally consistent with no significant arterial insufficiency. He has recommended endovenous ablation of both the left small saphenous and the left great saphenous vein. This would still be scheduled later. 03/14/2015 -- she has heard back from the vascular office and has surgery scheduled for sometime in July. Kristin, [name] ([id number]) Her rheumatologist has decreased her prednisone dosage but she still on it. She has also had cataract surgery in her right eye recently this week. 03/20/2015 - No new complaints today. Pain improved. No fever or chills. Tolerating 2 layer compression. 04/14/2015 -- she was doing very well today she went off on vacation and now her edema has increased markedly the ulceration is bigger and her diabetes is not under control. 04/21/2015 -- I spoke to her PCP Dr. Candiss Norse and discussed the management which would include being seen by a general surgeon for debridement and taking multiple punch biopsies which would help in establishing the diagnosis of this is a vasculitis. She is agreeable about this and will set her up for the procedure with Dr. Tamala Julian at [facility]. She was seen by the surgeon Dr. Jamal Collin. 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 She is scheduled to have laser ablation done next week.Pt was sent here for possible biopsy to look for vasculitis. Feel it would be better to wait after laser ablation is completed- the ulcer may heal fully and biopsy may not be necessary 04/29/2015 -- she had the venous ablation done by Dr. Lucky Cowboy last Friday and we do not have any notes yet. She is doing fine otherwise. 05/06/2015 --Review of her recent vascular intervention  shows that she was seen by Dr. Lucky Cowboy on 04/29/2015. The follow-up  duplex which was done showed that both the great saphenous vein and the small saphenous vein remained patent with reflux consistent with an unsuccessful ablation. He has rescheduled her for another the endovenous ablation to be done in about 4 weekso time. 05/13/2015 -- he was seen by her surgeon Dr. Jamal Collin who asked her to continue with conservative therapy and he would speak to Dr. Lucky Cowboy about her management. Dr. Lucky Cowboy is going to schedule her surgery in the middle of August for a repeat endovenous ablation. Her pus culture from last week has grown : Barrett her noted her sensitivity report but due to her multiple allergies I had tried clindamycin and she developed a rash with this too. She has been prescribed and anti-buttocks in the ER and is has it at home and she will let is know what she is going to be taking. 05/20/2015 -- she has developed a small spot on her right lower extremity but besides that it is not a full fledged ulceration. She did not get to see Dr. Lucky Cowboy last week and hopefully she will see him in the near future. 05/27/2015 -she is still awaiting her appointment with Dr.Dew and her vascular procedure is not scheduled until August 19. She will be seeing her PCP tomorrow and I have asked her to convey our discussion so that she is aware that debridement has not been done yet. 06/03/2015 -- was seen by her rheumatologist Dr. 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 He is lowering her prednisone to 12-1/2 mg for 1 month and then 10 mg per the next month. I will again make an attempt to speak to her PCP Dr. Candiss Norse and her surgeon Dr. Lucky Cowboy to see if he can organize for a debridement in the OR with multiple  biopsies to establish a diagnosis of vasculitis or pyoderma gangrenosum. 08/[name]/2016 -- Dr.Dew did her vascular procedure last week and a follow-up venous ultrasound shows good resolution of the veins as per the patient's history. He is to see her back in 2 Kristin. Kristin, [name] ([id number]) 07/07/2015. -- the patient has had a heavy growth of Proteus mirabilis and Enterococcus faecalis. These are sensitive to several drugs but the problem is she has allergies to all of these and hence I would like her to see Dr. Ola Spurr for this. She is also due to see Dr. Lucky Cowboy tomorrow and I will discussed the management with him including debridement under anesthesia and possible biopsies. 07/14/2015 -- she has an appointment to see Dr. Ola Spurr tomorrow and did see Dr. Bunnie Domino PA who will discuss my request with him. Electronic Signature(s) Signed: 07/14/2015 1:41:20 PM By: [name] Entered By: [name] on 07/14/2015 13:41:19 Bassette, TU SHIMMEL ([id number]) -------------------------------------------------------------------------------- Physical Exam Details Patient Name: VALESKA, HAISLIP [name] 07/14/2015 1:00 Date of Service: PM Medical Record [id number] Number: Patient Account Number: [id number] 1946/02/23 (69 y.o. Treating RN: [name] Date of Birth/Sex: Female) Other Clinician: Primary Care Physician: [facility], [name] Treating [name] Referring Physician: Glendon [name] Physician/Extender: [name] Constitutional . Pulse regular. Respirations normal and unlabored. Afebrile. . Eyes Nonicteric. Reactive to light. Ears, Nose, Mouth, and Throat Lips, teeth, and gums WNL.Marland Kitchen Moist mucosa without lesions . Neck supple and nontender. No palpable supraclavicular or cervical adenopathy. Normal sized without goiter. Respiratory WNL. No retractions.. Cardiovascular Pedal Pulses WNL.  No clubbing, cyanosis or edema. Chest Breasts symmetical and no nipple discharge..  Breast tissue WNL, no masses, lumps, or tenderness.. Lymphatic No adneopathy. No adenopathy. No adenopathy. Musculoskeletal Adexa without tenderness or enlargement.. Digits and nails w/o clubbing, cyanosis, infection, petechiae, ischemia, or inflammatory conditions.. Integumentary (Hair, Skin) No suspicious lesions. No crepitus or fluctuance. No peri-wound warmth or erythema. No masses.Marland Kitchen Psychiatric Judgement and insight Intact.. No evidence of depression, anxiety, or agitation.. Notes The smaller ulcerated area at about the 12:00 position has a sinus tract going up to about the 11:00 position and goes for about 3 cm deep. the main ulcerated area continues to have slough and sharp debridement has been done with a curette and this has been down to the subcutaneous tissue. Electronic Signature(s) Signed: 07/14/2015 1:42:31 PM By: [name] Entered By: [name] on 07/14/2015 13:42:31 Yohn, KLAIRA PESCI ([id number]) JOLEIGH, MINEAU ([id number]) -------------------------------------------------------------------------------- Physician Orders Details Patient Name: KATALEA, UCCI [name] 07/14/2015 1:00 Date of Service: PM Medical Record [id number] Number: Patient Account Number: [id number] [birth year] (69 y.o. Treating RN: [name] Date of Birth/Sex: Female) Other Clinician: Primary Care Physician: [facility], [name] Treating [name] Referring Physician: Glendon [name] Physician/Extender: [name] in Treatment: 55 Verbal / Phone Orders: Yes Clinician: Cornell [name] Read Back and Verified: Yes Diagnosis Coding Wound Cleansing Wound #2 Left,Distal Lower Leg o Clean wound with Normal Saline. Wound #5 Right,Posterior Lower Leg o Clean wound with Normal Saline. Anesthetic Wound #2 Left,Distal Lower Leg o Topical Lidocaine 4% cream applied to wound bed prior to debridement Wound #5 Right,Posterior Lower Leg o Topical Lidocaine 4% cream applied to wound bed prior to  debridement Primary Wound Dressing Wound #2 Left,Distal Lower Leg o Santyl Ointment o Iodoform packing Gauze - packed into proximal satellite area Wound #5 Right,Posterior Lower Leg o Other: - betadine paint Secondary Dressing Wound #2 Left,Distal Lower Leg o ABD and Kerlix/Conform Wound #5 Right,Posterior Lower Leg o Boardered Foam Dressing Dressing Change Frequency Wound #2 Left,Distal Lower Leg o Change dressing every day. Wound #5 Right,Posterior Lower Leg Zaccaro, Amiylah [name] ([id number]) o Change dressing every day. Follow-up Appointments Wound #2 Left,Distal Lower Leg o Return Appointment in 1 week. Wound #5 Right,Posterior Lower Leg o Return Appointment in 1 week. Edema Control Wound #2 Left,Distal Lower Leg o Support Garment 20-30 mm/Hg pressure to: Wound #5 Right,Posterior Lower Leg o Support Garment 20-30 mm/Hg pressure to: Additional Orders / Instructions Wound #2 Left,Distal Lower Leg o Increase protein intake. Wound #5 Right,Posterior Lower Leg o Increase protein intake. Electronic Signature(s) Signed: 07/14/2015 3:58:37 PM By: [name] Signed: 07/14/2015 4:49:24 PM By: [name], [name] Entered By: Kristin Cool, RN, [name], Kristin on 07/14/2015 13:37:19 Suh, SHARION GRIEVES ([id number]) -------------------------------------------------------------------------------- Problem List Details Patient Name: ARRIANNA, CATALA 07/14/2015 1:00 Date of Service: PM Medical Record [id number] Number: Patient Account Number: [id number] [birth year] (69 y.o. 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 ([id number]) -------------------------------------------------------------------------------- Progress Note Details Patient Name: NASHALY, DORANTES [name] 07/14/2015 1:00 Date of Service: PM Medical Record [id number] Number: Patient Account Number: [id number] 08-13-1946 (69 y.o. Treating RN: [name] Date of Birth/Sex: Female) Other Clinician: Primary Care Physician: [facility], [name] Treating [name] Referring Physician: Glendon [name] Physician/Extender: [name] in Treatment: [name] Subjective Chief Complaint Information obtained from Patient Patient presents to the wound care center for a consult due non healing wound 70 year old patient comes with a history of having a ulcer on the left lower extremity for the past 4 Kristin. she says she's had swelling of both lower extremities for about a year after she started having prednisone. 02/07/2015 -- her vascular appointments obtained were in the first and third week of June. she is able to go to [place] and we will try and get her some earlier appointments. Other than that nothing else has changed in her management. History of Present Illness (HPI) 69 year old patient who is known to have diabetes mellitus type 2, chronic renal insufficiency, coronary artery disease, hypertension, hypercholesterolemia, temporal  arteritis and inflammatory arthritiss also has a history of having a hysterectomy and some orthopedic related surgeries. The ulcer on the left lower extremity started off as a blister and then. Got progressively worse. She does not have any fever or chills and has not had any recent surgical intervention for this. Her last hemoglobin A1c was 10.1 in September 2015. She has been recently put on doxycycline by her PCP. She is now also allergic to doxycycline and was this was changed over to Keflex. 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 She does see a cardiologist and also takes a diuretic. 02/07/2015 her arterial and venous duplex studies to be done have dates been given as the first and third week of June. This is at [facility]. We are going to try and get early appointments at [facility]. other than that nothing has changed in her management. 02/14/2015 -- we have been able to get her an appointment in [facility] on May 20 which is much earlier than her previous ones at [place]. She continues with her prednisone and her sugars are in the range of 150-200. 02/21/2015 We were able to get a vascular lab workup for her today and she is going to be there at 2:00 this afternoon. the swelling of her leg has gone down significantly but she still has some tenderness over the wounds. 02/28/2015 - She has had one of two vascular workups done, and this coming Tuesday has another, at [facility]. She continues to be on steroid medications. She has significant sensitivity in her left lower extremity and has pain suggestive of neuropathic pain and I have asked her to address this with her primary care physician. 03/07/2015 -- The patient saw Dr. 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 Official report is Kristin, [name] ([id number]) awaited. Addendum: Official reports are now available and on 03/04/2015. She was seen and lower extremity venous duplex exam  was done. There was reflux present within the left greater saphenous vein below the knee and also the left small saphenous vein. Arterial duplex showed normal triphasic waveforms throughout the left lower extremity without any significant stenosis. Her ABIs were noncompressible bilaterally but a waveforms were normal and a digital pressures were normal bilaterally consistent with no significant arterial insufficiency. He has recommended endovenous ablation of both the left small saphenous and the left great saphenous vein. This would still be scheduled later. 03/14/2015 -- she has heard back from the vascular office and has surgery scheduled for sometime in July. Her rheumatologist has decreased her prednisone dosage but she still on it. She has also had cataract surgery in her right eye recently this week. 03/20/2015 - No new complaints today. Pain improved. No fever or chills. Tolerating 2 layer compression. 04/14/2015 -- she was doing very well today she went off on vacation and now her edema has increased markedly the ulceration is bigger and her diabetes is not under control. 04/21/2015 -- I spoke to her PCP Dr. Candiss Norse and discussed the management which would include being seen by a general surgeon for debridement and taking multiple punch biopsies which would help in establishing the diagnosis of this is a vasculitis. She is agreeable about this and will set her up for the procedure with Dr. Tamala Julian at [facility]. She was seen by the surgeon Dr. Jamal Collin. 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 She is scheduled to have laser ablation done next week.Pt was sent  here for possible biopsy to look for vasculitis. Feel it would be better to wait after laser ablation is completed- the ulcer may heal fully and biopsy may not be necessary 04/29/2015 -- she had the venous ablation done by Dr. Lucky Cowboy last Friday and we do not have any notes yet. She is doing fine otherwise. 05/06/2015 --Review of her recent vascular intervention shows that she was seen by Dr. Lucky Cowboy on 04/29/2015. The follow-up duplex which was done showed that both the great saphenous vein and the small saphenous vein remained patent with reflux consistent with an unsuccessful ablation. He has rescheduled her for another the endovenous ablation to be done in about 4 Kristin time. 05/13/2015 -- he was seen by her surgeon Dr. Jamal Collin who asked her to continue with conservative therapy and he would speak to Dr. Lucky Cowboy about her management. Dr. Lucky Cowboy is going to schedule her surgery in the middle of August for a repeat endovenous ablation. Her pus culture from last week has grown : [place] her noted her sensitivity report but due to her multiple allergies I had tried clindamycin and she developed a rash with this too. She has been prescribed and anti-buttocks in the ER and is has it at home and she will let is know what she is going to be taking. 05/20/2015 -- she has developed a small spot on her right lower extremity but besides that it is not a full fledged ulceration. She did not get to see Dr. Lucky Cowboy last week and hopefully she will see him in the near future. 05/27/2015 -she is still awaiting her appointment with Dr.Dew and her vascular procedure is not scheduled until August 19. She will be seeing her PCP tomorrow and I have asked her to convey our discussion so that she is aware that debridement has not been done  yetSHYKERIA, [name] ([id number]) 06/03/2015 -- was seen by her rheumatologist Dr. 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 He is lowering her prednisone to 12-1/2 mg for 1 month and then 10 mg per the next month. I will again make an attempt to speak to her PCP Dr. Candiss Norse and her surgeon Dr. 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 08/[name]/2016 -- Dr.Dew did her vascular procedure last week and a follow-up venous ultrasound shows good resolution of the veins as per the patient's history. He is to see her back in 2 Kristin. 07/07/2015. -- the patient has had a heavy growth of Proteus mirabilis and Enterococcus faecalis. These are sensitive to several drugs but the problem is she has allergies to all of these and hence I would like her to see Dr. Ola Spurr for this. She is also due to see Dr. Lucky Cowboy tomorrow and I will discussed the management with him including debridement under anesthesia and possible biopsies. 07/14/2015 -- she has an appointment to see Dr. Ola Spurr tomorrow and did see Dr. Bunnie Domino PA who will discuss my request with him. Objective Constitutional Pulse regular. Respirations normal and unlabored. Afebrile. Vitals Time Taken: 1:17 PM, Height: 65 in, Weight: 248 lbs, BMI: 41.3, Temperature: 97.7 F, Pulse: 73 bpm, Respiratory Rate: 20 breaths/min, Blood Pressure: 139/59 mmHg. Eyes Nonicteric. Reactive to light. Ears, Nose, Mouth, and Throat Lips, teeth, and gums WNL.Marland Kitchen Moist mucosa without lesions . Neck supple and nontender. No palpable supraclavicular or cervical adenopathy. Normal sized without goiter. Respiratory WNL. No retractions.. Cardiovascular Pedal Pulses WNL. No clubbing, cyanosis or edema. Chest Breasts symmetical and no nipple discharge.. Breast tissue WNL, no masses, lumps, or tenderness.Marland Kitchen Kristin, SHIMIZU [name] ([id number]) Lymphatic No  adneopathy. No adenopathy. No adenopathy. Musculoskeletal Adexa without tenderness or enlargement.. Digits and nails w/o clubbing, cyanosis, infection, petechiae, ischemia, or inflammatory conditions.Marland Kitchen Psychiatric Judgement and insight Intact.. No evidence of depression, anxiety, or agitation.. General Notes: The smaller ulcerated area at about the 12:00 position has a sinus tract going up to about the 11:00 position and goes for about 3 cm deep. the main ulcerated area continues to have slough and sharp debridement has been done with a curette and this has been down to the subcutaneous tissue. Integumentary (Hair, Skin) No suspicious lesions. No crepitus or fluctuance. No peri-wound warmth or erythema. No masses.. Wound #2 status is Open. Original cause of wound was Blister. The wound is located on the Left,Distal Lower Leg. The wound measures 6.4cm length x 5.2cm width x 0.7cm depth; 26.138cm^2 area and 18.297cm^3 volume. The wound is limited to skin breakdown. There is no tunneling or undermining noted. There is a medium amount of purulent drainage noted. The wound margin is distinct with the outline attached to the wound base. There is medium (34-66%) red, pink granulation within the wound bed. There is a medium (34-66%) amount of necrotic tissue within the wound bed including Adherent Slough. The periwound skin appearance exhibited: Scarring, Moist, Hemosiderin Staining. The periwound skin appearance did not exhibit: Callus, Crepitus, Excoriation, Fluctuance, Friable, Induration, Localized Edema, Rash, Dry/Scaly, Maceration, Atrophie Blanche, Cyanosis, Ecchymosis, Mottled, Pallor, Rubor, Erythema. Periwound temperature was noted as No Abnormality. The periwound has tenderness on palpation. Wound #5 status is Open. Original cause of wound was Gradually Appeared. The wound is located  on the Right,Posterior Lower Leg. The wound measures 1.3cm length x 1.2cm width x 0.1cm depth;  1.225cm^2 area and 0.123cm^3 volume. The wound is limited to skin breakdown. There is no tunneling or undermining noted. There is a none present amount of drainage noted. The wound margin is flat and intact. There is no granulation within the wound bed. There is a large (67-100%) amount of necrotic tissue within the wound bed including Eschar. The periwound skin appearance exhibited: Dry/Scaly, Hemosiderin Staining. The periwound skin appearance did not exhibit: Callus, Crepitus, Excoriation, Fluctuance, Friable, Induration, Localized Edema, Rash, Scarring, Maceration, Moist, Atrophie Blanche, Cyanosis, Ecchymosis, Mottled, Pallor, Rubor, Erythema. Periwound temperature was noted as No Abnormality. The periwound has tenderness on palpation. 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 She will get advice about antibiotics from Dr. Ola Spurr and hopefully hear from Dr. Lucky Cowboy regarding her biopsy. She will see me back next week Procedures Wound #2 Wound #2 is a Venous Leg Ulcer located on the Left,Distal Lower Leg . There was a Skin/Subcutaneous Tissue Debridement ([id number]) debridement with total area of 33.28 sq cm performed by Britto, Jackson Latino., MD. with the following instrument(s): Curette to remove Viable and Non-Viable tissue/material including Exudate, Fibrin/Slough, and Subcutaneous after  achieving pain control using Other (lidocaine 4%). A time out was conducted prior to the start of the procedure. A Minimum amount of bleeding was controlled with Pressure. The procedure was tolerated well with a pain level of 0 throughout and a pain level of 0 following the procedure. Post Debridement Measurements: 6.4cm length x 5.2cm width x 0.7cm depth; 18.297cm^3 volume. Post procedure Diagnosis Wound #2: Same as Pre-Procedure Plan Wound Cleansing: Wound #2 Left,Distal Lower Leg: Clean wound with Normal Saline. Wound #5 Right,Posterior Lower Leg: Clean wound with Normal Saline. Anesthetic: Wound #2 Left,Distal Lower Leg: Topical Lidocaine 4% cream applied to wound bed prior to debridement Wound #5 Right,Posterior Lower Leg: Topical Lidocaine 4% cream applied to wound bed prior to debridement Primary Wound Dressing: Wound #2 Left,Distal Lower Leg: Russey, Sherlin [name] ([id number]) Santyl Ointment Iodoform packing Gauze - packed into proximal satellite area Wound #5 Right,Posterior Lower Leg: Other: - betadine paint Secondary Dressing: Wound #2 Left,Distal Lower Leg: ABD and Kerlix/Conform Wound #5 Right,Posterior Lower Leg: Boardered Foam Dressing Dressing Change Frequency: Wound #2 Left,Distal Lower Leg: Change dressing every day. Wound #5 Right,Posterior Lower Leg: Change dressing every day. Follow-up Appointments: Wound #2 Left,Distal Lower Leg: Return Appointment in 1 week. Wound #5 Right,Posterior Lower Leg: Return Appointment in 1 week. Edema Control: Wound #2 Left,Distal Lower Leg: Support Garment 20-30 mm/Hg pressure to: Wound #5 Right,Posterior Lower Leg: Support Garment 20-30 mm/Hg pressure to: Additional Orders / Instructions: Wound #2 Left,Distal Lower Leg: Increase protein intake. Wound #5 Right,Posterior Lower Leg: Increase protein intake. We will use 1/4 inch iodoform gauze to pack the ulcer at 12:00 o'clock position and then continues use Santyl on the  main wound. She will  get advice about antibiotics from Dr. Ola Spurr and hopefully hear from Dr. Lucky Cowboy regarding her biopsy. She will see me back next week Electronic Signature(s) Signed: 07/14/2015 1:44:31 PM By: [name] Entered By: [name] on 07/14/2015 13:44:31 Stgermain, Nasia [name] ([id number]) -------------------------------------------------------------------------------- SuperBill Details Patient Name: [name] Date of Service: 07/14/2015 Medical Record Number: [id number] Patient Account Number: [id number] Date of Birth/Sex: 29-Jun-1946 (68 y.o. 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 L03.116 Cellulitis of left lower limb Facility Procedures CPT4: Description Modifier Quantity Code [id number] 11042 - DEB SUBQ TISSUE 20 SQ CM/< 1 ICD-10 Description Diagnosis E11.622 Type 2 diabetes mellitus with other skin ulcer L97.322 Non-pressure chronic ulcer of left ankle with fat layer exposed I83.222  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

## 2015-07-21 ENCOUNTER — Encounter: Payer: PPO | Admitting: Surgery

## 2015-07-21 DIAGNOSIS — L97322 Non-pressure chronic ulcer of left ankle with fat layer exposed: Secondary | ICD-10-CM | POA: Diagnosis not present

## 2015-07-22 NOTE — Progress Notes (Signed)
Kristin, [name] ([id number]) Visit Report for 07/21/2015 Chief Complaint Document Details Patient Name: [name], [name] 07/21/2015 3:00 Date of Service: PM Medical Record [id number] Number: Patient Account Number: [id number] 12-31-1945 (69 y.o. Treating RN: [name] Date of Birth/Sex: Female) Other Clinician: Primary Care Physician: [facility], Delana Meyer Treating [name] Referring Physician: Glendon Axe Physician/Extender: Weeks in Treatment: 24 Information Obtained from: Patient Chief Complaint Patient presents to the wound care center for a consult due non healing wound 69 year old patient comes with a history of having a ulcer on the left lower extremity for the past 4 weeks. she says she's had swelling of both lower extremities for about a year after she started having prednisone. 02/07/2015 -- her vascular appointments obtained were in the first and third week of June. she is able to go to [place] and we will try and get her some earlier appointments. Other than that nothing else has changed in her management. Electronic Signature(s) Signed: 07/21/2015 3:40:19 PM By: [name], [name] Entered By: [name] on 07/21/2015 15:40:18 Heffler, MICHAYLA MCNEIL ([id number]) -------------------------------------------------------------------------------- HPI Details Patient Name: Kristin, [name] 07/21/2015 3:00 Date of Service: PM Medical Record [id number] Number: Patient Account Number: [id number] [birth year] (69 y.o. Treating RN: [name] Date of Birth/Sex: Female) Other Clinician: Primary Care Physician: [facility], Delana Meyer Treating Britto, Errol Referring Physician: Glendon Axe Physician/Extender: Weeks in Treatment: 24 History of Present Illness HPI Description: 69 year old patient who is known to have diabetes mellitus type 2, chronic renal insufficiency, coronary artery disease, hypertension, hypercholesterolemia, temporal arteritis and inflammatory arthritiss  also has a history of having a hysterectomy and some orthopedic related surgeries. The ulcer on the left lower extremity started off as a blister and then. Got progressively worse. She does not have any fever or chills and has not had any recent surgical intervention for this. Her last hemoglobin A1c was 10.1 in September 2015. She has been recently put on doxycycline by her PCP. She is now also allergic to doxycycline and was this was changed over to Keflex. 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 She does see a cardiologist and also takes a diuretic. 02/07/2015 her arterial and venous duplex studies to be done have dates been given as the first and third week of June. This is at [facility]. We are going to try and get early appointments at [facility]. other than that nothing has changed in her management. 02/14/2015 -- we have been able to get her an appointment in [facility] on May 20 which is much earlier than her previous ones at [place]. She continues with her prednisone and her sugars are in the range of 150-200. 02/21/2015 We were able to get a vascular lab workup for her today and she is going to be there at 2:00 this afternoon. the swelling of her leg has gone down significantly but she still has some tenderness over the wounds. 02/28/2015 - She has had one of two vascular workups done, and this coming Tuesday has another, at [place] [facility]. She continues to be on steroid medications. She has significant sensitivity in her left lower extremity and has pain suggestive of neuropathic pain and I have asked her to address this with her primary care physician. 03/07/2015 -- The patient saw Dr. Lucky Cowboy for a consultation and he has had her arteries are okay but she has 2 incompetent veins on the  left lower extremity and he is going to set her up for surgery.  Official report is awaited. Addendum: Official reports are now available and on 03/04/2015. She was seen and lower extremity venous duplex exam was done. There was reflux present within the left greater saphenous vein below the knee and also the left small saphenous vein. Arterial duplex showed normal triphasic waveforms throughout the left lower extremity without any significant stenosis. Her ABIs were noncompressible bilaterally but a waveforms were normal and a digital pressures were normal bilaterally consistent with no significant arterial insufficiency. He has recommended endovenous ablation of both the left small saphenous and the left great saphenous vein. This would still be scheduled later. 03/14/2015 -- she has heard back from the vascular office and has surgery scheduled for sometime in July. Kristin, [name] ([id number]) Her rheumatologist has decreased her prednisone dosage but she still on it. She has also had cataract surgery in her right eye recently this week. 03/20/2015 - No new complaints today. Pain improved. No fever or chills. Tolerating 2 layer compression. 04/14/2015 -- she was doing very well today she went off on vacation and now her edema has increased markedly the ulceration is bigger and her diabetes is not under control. 04/21/2015 -- I spoke to her PCP Dr. Candiss Norse and discussed the management which would include being seen by a general surgeon for debridement and taking multiple punch biopsies which would help in establishing the diagnosis of this is a vasculitis. She is agreeable about this and will set her up for the procedure with Dr. Tamala Julian at [facility]. She was seen by the surgeon Dr. Jamal Collin. 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 She is scheduled to have laser ablation done next week.Pt was sent here for possible biopsy to look for  vasculitis. Feel it would be better to wait after laser ablation is completed- the ulcer may heal fully and biopsy may not be necessary 04/29/2015 -- she had the venous ablation done by Dr. Lucky Cowboy last Friday and we do not have any notes yet. She is doing fine otherwise. 05/06/2015 --Review of her recent vascular intervention shows that she was seen by Dr. Lucky Cowboy on 04/29/2015. The follow-up duplex which was done showed that both the great saphenous vein and the small saphenous vein remained patent with reflux consistent with an unsuccessful ablation. He has rescheduled her for another the endovenous ablation to be done in about 4 weekso time. 05/13/2015 -- he was seen by her surgeon Dr. Jamal Collin who asked her to continue with conservative therapy and he would speak to Dr. Lucky Cowboy about her management. Dr. Lucky Cowboy is going to schedule her surgery in the middle of August for a repeat endovenous ablation. Her pus culture from last week has grown : [place] her noted her sensitivity report but due to her multiple allergies I had tried clindamycin and she developed a rash with this too. She has been prescribed and anti-buttocks in the ER and is has it at home and she will let is know what she is going to be taking. 05/20/2015 -- she has developed a small spot on her right lower extremity but besides that it is not a full fledged ulceration. She did not get to see Dr. Lucky Cowboy last week and hopefully she will see him in the near future. 05/27/2015 -she is still awaiting her  appointment with Dr.Dew and her vascular procedure is not scheduled until August 19. She will be seeing her PCP tomorrow and I have asked her to convey our discussion so that she is aware that debridement has not been done yet. 06/03/2015 -- was seen by her rheumatologist Dr. Dorthula Matas, who has been treating her for temporal arteritis and  in his note has mentioned the possibility of vasculitis or pyoderma gangrenosum. He is lowering her prednisone to 12-1/2 mg for 1 month and then 10 mg per the next month. I will again make an attempt to speak to her PCP Dr. Candiss Norse and her surgeon Dr. 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 06/17/2015 -- Dr.Dew did her vascular procedure last week and a follow-up venous ultrasound shows good resolution of the veins as per the patient's history. He is to see her back in 2 weeks. MAIRI, STAGLIANO ([id number]) 07/07/2015. -- the patient has had a heavy growth of Proteus mirabilis and Enterococcus faecalis. These are sensitive to several drugs but the problem is she has allergies to all of these and hence I would like her to see Dr. Ola Spurr for this. She is also due to see Dr. Lucky Cowboy tomorrow and I will discussed the management with him including debridement under anesthesia and possible biopsies. 07/14/2015 -- she has an appointment to see Dr. Ola Spurr tomorrow and did see Dr. Bunnie Domino PA who will discuss my request with him. 07/21/2015 -- saw Dr. Ola Spurr was able to do a test on her and has put her on amoxicillin. She has been tolerating that and has had no problems with allergies to this. Electronic Signature(s) Signed: 07/21/2015 3:41:03 PM By: [name], [name] Entered By: [name] on 07/21/2015 15:41:03 Podolski, AUDRI KOZUB ([id number]) -------------------------------------------------------------------------------- Physical Exam Details Patient Name: BELLAROSE, BURTT D. 07/21/2015 3:00 Date of Service: PM Medical Record [id number] Number: Patient Account Number: [id number] [birth year] (68 y.o. Treating RN: [name] Date of Birth/Sex: Female) Other Clinician: Primary Care Physician: [facility], Delana Meyer Treating [name] Referring Physician: Glendon Axe Physician/Extender: Weeks in Treatment: 24 Constitutional . Pulse regular. Respirations normal and unlabored. Afebrile. . Eyes Nonicteric. Reactive to light. Ears, Nose, Mouth, and Throat Lips, teeth, and gums WNL.Marland Kitchen Moist mucosa without lesions . Neck supple and nontender. No palpable supraclavicular or cervical adenopathy. Normal sized without goiter. Respiratory WNL. No retractions.. Cardiovascular Pedal Pulses WNL. No clubbing, cyanosis or edema. Chest Breasts symmetical and no nipple discharge.. Breast tissue WNL, no masses, lumps, or tenderness.. Lymphatic No adneopathy. No adenopathy. No adenopathy. Musculoskeletal Adexa without tenderness or enlargement.. Digits and nails w/o clubbing, cyanosis, infection, petechiae, ischemia, or inflammatory conditions.. Integumentary (Hair, Skin) No suspicious lesions. No crepitus or fluctuance. No peri-wound warmth or erythema. No masses.Marland Kitchen Psychiatric Judgement and insight Intact.. No evidence of depression, anxiety, or agitation.. Notes The ulcer at the 12:00 position continues to have a sinus tract going superiorly and this is now about 2 cm in depth. The main wound is looking cleaner has less slough and there is no evidence of cellulitis surrounding it. Electronic Signature(s) Signed: 07/21/2015 3:42:06 PM By: [name], [name] Entered By: [name] on 07/21/2015 15:42:05 Larke, ADWOA AXE ([id number]) -------------------------------------------------------------------------------- Physician Orders Details Patient Name: TANIS, HENSARLING D. 07/21/2015 3:00 Date of Service: PM Medical Record [id number] Number: Patient Account Number: [id number] 25-Oct-1946 (69 y.o. Treating RN: [name] Date of Birth/Sex: Female) Other Clinician:  Primary Care Physician: [facility], Delana Meyer Treating [name] Referring Physician: Glendon Axe Physician/Extender: [place] in Treatment: 24 Verbal / Phone Orders: Yes Clinician:  [name] Read Back and Verified: Yes Diagnosis Coding Wound Cleansing Wound #2 Left,Distal Lower Leg o Clean wound with Normal Saline. Wound #5 Right,Posterior Lower Leg o Clean wound with Normal Saline. Anesthetic Wound #2 Left,Distal Lower Leg o Topical Lidocaine 4% cream applied to wound bed prior to debridement Wound #5 Right,Posterior Lower Leg o Topical Lidocaine 4% cream applied to wound bed prior to debridement Primary Wound Dressing Wound #2 Left,Distal Lower Leg o Santyl Ointment o Iodoform packing Gauze - packed into proximal satellite area Wound #5 Right,Posterior Lower Leg o Other: - betadine paint Secondary Dressing Wound #2 Left,Distal Lower Leg o ABD and Kerlix/Conform Wound #5 Right,Posterior Lower Leg o Boardered Foam Dressing Dressing Change Frequency Wound #2 Left,Distal Lower Leg o Change dressing every day. Wound #5 Right,Posterior Lower Leg Merkin, Lyndzie D. ([id number]) o Change dressing every day. Follow-up Appointments Wound #2 Left,Distal Lower Leg o Return Appointment in 1 week. Wound #5 Right,Posterior Lower Leg o Return Appointment in 1 week. Edema Control Wound #2 Left,Distal Lower Leg o Support Garment 20-30 mm/Hg pressure to: Wound #5 Right,Posterior Lower Leg o Support Garment 20-30 mm/Hg pressure to: Additional Orders / Instructions Wound #2 Left,Distal Lower Leg o Increase protein intake. Wound #5 Right,Posterior Lower Leg o Increase protein intake. Electronic Signature(s) Signed: 07/21/2015 4:29:43 PM By: [name], [name] Signed: 07/21/2015 5:00:42 PM By: [name] Entered By: [name] on 07/21/2015 15:32:43 Weigand, KINYA MEINE ([id number]) -------------------------------------------------------------------------------- Problem List Details Patient Name: DALANA, PFAHLER D. 07/21/2015 3:00 Date of Service: PM Medical Record [id number] Number: Patient Account Number:  [id number] Jan 26, 1946 (69 y.o. 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 ([id number]) -------------------------------------------------------------------------------- Progress Note Details Patient Name: EZRAH, DEMBECK D. 07/21/2015 3:00 Date of Service: PM Medical Record [id number] Number: Patient Account Number: [id number] 1946/04/27 (69 y.o. Treating RN: [name] Date of Birth/Sex: Female) Other Clinician: Primary Care Physician: [facility], Delana Meyer Treating [name] Referring Physician: Glendon Axe Physician/Extender: Weeks in Treatment: 24 Subjective Chief Complaint Information obtained from Patient Patient presents to the wound care center for a consult due non healing wound 69 year old patient comes with a history of having a ulcer on the left lower extremity for the past 4 weeks. she says she's had swelling of both lower extremities for about a year after she started having prednisone.  02/07/2015 -- her vascular appointments obtained were in the first and third week of June. she is able to go to [place] and we will try and get her some earlier appointments. Other than that nothing else has changed in her management.  History of Present Illness (HPI) 69 year old patient who is known to have diabetes mellitus type 2, chronic renal insufficiency, coronary artery disease, hypertension, hypercholesterolemia, temporal arteritis and inflammatory arthritiss also has a history of having a hysterectomy and some orthopedic related surgeries. The ulcer on the left lower extremity started off as a blister and then. Got progressively worse. She does not have any fever or chills and has not had any recent surgical intervention for this. Her last hemoglobin A1c was 10.1 in September 2015. She has been recently put on doxycycline by her PCP. She is now also allergic to doxycycline and was this was changed over to Keflex. 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 She does see a cardiologist and also takes a diuretic. 02/07/2015 her arterial and venous duplex studies to be done have dates been given as the first and third week of June. This is at [facility]. We are going to try and get early appointments at [facility]. other than that nothing has changed in her management. 02/14/2015 -- we have been able to get her an appointment in [facility] on May 20 which is much earlier than her previous ones at [place]. She continues with her prednisone and her sugars are in the range of 150-200. 02/21/2015 We were able to get a vascular lab workup for her today and she is going to be there at 2:00 this afternoon. the swelling of her leg has gone down significantly but she still has some tenderness over the wounds. 02/28/2015 - She has had one of two vascular workups done, and this coming  Tuesday has another, at [place] [facility]. She continues to be on steroid medications. She has significant sensitivity in her left lower extremity and has pain suggestive of neuropathic pain and I have asked her to address this with her primary care physician. 03/07/2015 -- The patient saw Dr. Lucky Cowboy for a consultation and he has had her arteries are okay but she has 2 incompetent veins on the left lower extremity and he is going to set her up for surgery. Official report is ADEOLA, DENNEN ([id number]) awaited. Addendum: Official reports are now available and on 03/04/2015. She was seen and lower extremity venous duplex exam was done. There was reflux present within the left greater saphenous vein below the knee and also the left small saphenous vein. Arterial duplex showed normal triphasic waveforms throughout the left lower extremity without any significant stenosis. Her ABIs were noncompressible bilaterally but a waveforms were normal and a digital pressures were normal bilaterally consistent with no significant arterial insufficiency. He has recommended endovenous ablation of both the left small saphenous and the left great saphenous vein. This would still be scheduled later. 03/14/2015 -- she has heard back from the vascular office and has surgery scheduled for sometime in July. Her rheumatologist has decreased her prednisone dosage but she still on it. She has also had cataract surgery in her right eye recently this week. 03/20/2015 - No new complaints today. Pain improved. No fever or chills. Tolerating 2 layer compression. 04/14/2015 -- she was doing very well today she went off on vacation and now her edema has increased markedly the ulceration is bigger and her diabetes is not under control. 04/21/2015 -- I spoke to her PCP Dr. Candiss Norse and discussed the management which would include being seen by a general surgeon for debridement  and taking multiple punch biopsies which  would help in establishing the diagnosis of this is a vasculitis. She is agreeable about this and will set her up for the procedure with Dr. Tamala Julian at [facility]. She was seen by the surgeon Dr. Jamal Collin. 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 She is scheduled to have laser ablation done next week.Pt was sent here for possible biopsy to look for vasculitis. Feel it would be better to wait after laser ablation is completed- the ulcer may heal fully and biopsy may not be necessary 04/29/2015 -- she had the venous ablation done by Dr. Lucky Cowboy last Friday and we do not have any notes yet. She is doing fine otherwise. 05/06/2015 --Review of her recent vascular intervention shows that she was seen by Dr. Lucky Cowboy on 04/29/2015. The follow-up duplex which was done showed that both the great saphenous vein and the small saphenous vein remained patent with reflux consistent with an unsuccessful ablation. He has rescheduled her for another the endovenous ablation to be done in about 4 weeks time. 05/13/2015 -- he was seen by her surgeon Dr. Jamal Collin who asked her to continue with conservative therapy and he would speak to Dr. Lucky Cowboy about her management. Dr. Lucky Cowboy is going to schedule her surgery in the middle of August for a repeat endovenous ablation. Her pus culture from last week has grown : [place] her noted her sensitivity report but due to her multiple allergies I had tried clindamycin and she developed a rash with this too. She has been prescribed and anti-buttocks in the ER and is has it at home and she will let is know what she is going to be taking. 05/20/2015 -- she has developed a small spot on her right lower extremity but besides that it is not a full fledged ulceration. She did not get  to see Dr. Lucky Cowboy last week and hopefully she will see him in the near future. 05/27/2015 -she is still awaiting her appointment with Dr.Dew and her vascular procedure is not scheduled until August 19. She will be seeing her PCP tomorrow and I have asked her to convey our discussion so that she is aware that debridement has not been done yet. LORAYNE, GETCHELL ([id number]) 06/03/2015 -- was seen by her rheumatologist Dr. 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 He is lowering her prednisone to 12-1/2 mg for 1 month and then 10 mg per the next month. I will again make an attempt to speak to her PCP Dr. Candiss Norse and her surgeon Dr. 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 06/17/2015 -- Dr.Dew did her vascular procedure last week and a follow-up venous ultrasound shows good resolution of the veins as per the patient's history. He is to see her back in 2 weeks. 07/07/2015. -- the patient has had a heavy growth of Proteus mirabilis and Enterococcus faecalis. These are sensitive to several drugs but the problem is she has allergies to all of these and hence I would like her to see Dr. Ola Spurr for this. She is also due to see Dr. Lucky Cowboy tomorrow and I will discussed the management with him including debridement under  anesthesia and possible biopsies. 07/14/2015 -- she has an appointment to see Dr. Ola Spurr tomorrow and did see Dr. Bunnie Domino PA who will discuss my request with him. 07/21/2015 -- saw Dr. Ola Spurr was able to do a test on her and has put her on amoxicillin. She has been tolerating that and has had no problems with allergies to this. Objective Constitutional Pulse regular. Respirations normal and unlabored. Afebrile. Vitals Time Taken: 3:15 PM, Height: 65 in, Weight: 248 lbs,  BMI: 41.3, Temperature: 98.1 F, Pulse: 74 bpm, Respiratory Rate: 20 breaths/min, Blood Pressure: 152/73 mmHg. Eyes Nonicteric. Reactive to light. Ears, Nose, Mouth, and Throat Lips, teeth, and gums WNL.Marland Kitchen Moist mucosa without lesions . Neck supple and nontender. No palpable supraclavicular or cervical adenopathy. Normal sized without goiter. Respiratory WNL. No retractions.. Cardiovascular Pedal Pulses WNL. No clubbing, cyanosis or edema. Chest SHEILY, LINEMAN ([id number]) Breasts symmetical and no nipple discharge.. Breast tissue WNL, no masses, lumps, or tenderness.. Lymphatic No adneopathy. No adenopathy. No adenopathy. Musculoskeletal Adexa without tenderness or enlargement.. Digits and nails w/o clubbing, cyanosis, infection, petechiae, ischemia, or inflammatory conditions.Marland Kitchen Psychiatric Judgement and insight Intact.. No evidence of depression, anxiety, or agitation.. General Notes: The ulcer at the 12:00 position continues to have a sinus tract going superiorly and this is now about 2 cm in depth. The main wound is looking cleaner has less slough and there is no evidence of cellulitis surrounding it. Integumentary (Hair, Skin) No suspicious lesions. No crepitus or fluctuance. No peri-wound warmth or erythema. No masses.. Wound #2 status is Open. Original cause of wound was Blister. The wound is located on the Left,Distal Lower Leg. The wound measures 6.7cm length x 5.3cm width x 0.5cm depth; 27.889cm^2 area and 13.945cm^3 volume. The wound is limited to skin breakdown. There is no tunneling or undermining noted. There is a medium amount of purulent drainage noted. The wound margin is distinct with the outline attached to the wound base. There is medium (34-66%) red, pink granulation within the wound bed. There is a medium (34-66%) amount of necrotic tissue within the wound bed including Adherent Slough. The periwound skin appearance exhibited: Scarring, Moist, Hemosiderin  Staining. The periwound skin appearance did not exhibit: Callus, Crepitus, Excoriation, Fluctuance, Friable, Induration, Localized Edema, Rash, Dry/Scaly, Maceration, Atrophie Blanche, Cyanosis, Ecchymosis, Mottled, Pallor, Rubor, Erythema. Periwound temperature was noted as No Abnormality. The periwound has tenderness on palpation. Wound #5 status is Open. Original cause of wound was Gradually Appeared. The wound is located on the Right,Posterior Lower Leg. The wound measures 1.3cm length x 1.6cm width x 0.1cm depth; 1.634cm^2 area and 0.163cm^3 volume. The wound is limited to skin breakdown. There is no tunneling or undermining noted. There is a none present amount of drainage noted. The wound margin is flat and intact. There is no granulation within the wound bed. There is a large (67-100%) amount of necrotic tissue within the wound bed including Eschar. The periwound skin appearance exhibited: Dry/Scaly, Hemosiderin Staining. The periwound skin appearance did not exhibit: Callus, Crepitus, Excoriation, Fluctuance, Friable, Induration, Localized Edema, Rash, Scarring, Maceration, Moist, Atrophie Blanche, Cyanosis, Ecchymosis, Mottled, Pallor, Rubor, Erythema. Periwound temperature was noted as No Abnormality. The periwound has tenderness on palpation. 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 He was continued on  amoxicillin as per Dr. Ola Spurr. She will see me back next week. Plan Wound Cleansing: Wound #2 Left,Distal Lower Leg: Clean wound with Normal Saline. Wound #5 Right,Posterior Lower Leg: Clean wound with Normal Saline. Anesthetic: Wound #2 Left,Distal Lower Leg: Topical Lidocaine 4% cream applied to wound bed prior to debridement Wound #5 Right,Posterior Lower Leg: Topical Lidocaine 4% cream applied to wound bed prior to debridement Primary Wound Dressing: Wound #2 Left,Distal Lower Leg: Santyl Ointment Iodoform packing Gauze - packed into proximal satellite area Wound #5 Right,Posterior Lower Leg: Other: - betadine paint Secondary Dressing: Wound #2 Left,Distal Lower Leg: ABD and Kerlix/Conform Wound #5 Right,Posterior Lower Leg: Boardered Foam Dressing Dressing Change Frequency: Wound #2 Left,Distal Lower Leg: Change dressing every day. Wound #5 Right,Posterior Lower Leg: Change dressing every day. Follow-up Appointments: SAIMA, MONTERROSO ([id number]) Wound #2 Left,Distal Lower Leg: Return Appointment in 1 week. Wound #5 Right,Posterior Lower Leg: Return Appointment in 1 week. Edema Control: Wound #2 Left,Distal Lower Leg: Support Garment 20-30 mm/Hg pressure to: Wound #5 Right,Posterior Lower Leg: Support Garment 20-30 mm/Hg pressure to: Additional Orders / Instructions: Wound #2 Left,Distal Lower Leg: Increase protein intake. Wound #5 Right,Posterior Lower Leg: Increase protein intake. I have recommended packing the sinus tract with quarter-inch iodoform gauze and using Santyl on the main wound. He was continued on amoxicillin as per Dr. Ola Spurr. She will see me back next week. Electronic Signature(s) Signed: 07/21/2015 3:42:52 PM By: [name], [name] Entered By: [name] on 07/21/2015 15:42:52 Atkerson, Kizzy D. ([id number]) -------------------------------------------------------------------------------- SuperBill Details Patient Name: DAVIANNA, DEUTSCHMAN  D. Date of Service: 07/21/2015 Medical Record Number: [id number] Patient Account Number: [id number] Date of Birth/Sex: 10-05-1946 (68 y.o. 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 I83.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 L03.116 Cellulitis of left lower limb Facility Procedures CPT4 Code: 48889169 Description: 99213 - WOUND CARE VISIT-LEV 3 EST PT Modifier: Quantity: 1 Physician Procedures CPT4: Description Modifier Quantity Code [id number] 99213 - WC PHYS LEVEL 3 - EST PT 1 ICD-10 Description Diagnosis E11.622 Type 2 diabetes mellitus with other skin ulcer L97.322 Non-pressure chronic ulcer of left ankle with fat layer exposed I83.222  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

## 2015-07-22 NOTE — Progress Notes (Signed)
[name], [name] ([id number]) Visit Report for 07/21/2015 Arrival Information Details Patient Name: [name], [name] Date of Service: 07/21/2015 3:00 PM Medical Record Number: [id number] Patient Account Number: [id number] Date of Birth/Sex: 03-Jan-1946 (68 y.o. Female) Treating RN: [name] Primary Care Physician: [name] Other Clinician: Referring Physician: Glendon [name] Treating Physician/Extender: [name] in Treatment: 24 Visit Information History Since Last Visit Added or deleted any medications: No Patient Arrived: Ambulatory Any new allergies or adverse reactions: No Arrival Time: 15:13 Had a fall or experienced change in No Accompanied By: self activities of daily living that may affect Transfer Assistance: None risk of falls: Patient Identification Verified: Yes Signs or symptoms of abuse/neglect since last No Secondary Verification Process Yes visito Completed: Hospitalized since last visit: No Patient Has Alerts: Yes Pain Present Now: No Patient Alerts: Patient on Blood Thinner Electronic Signature(s) Signed: 07/21/2015 5:00:42 PM By: [name] Entered By: [name] on 07/21/2015 15:15:17 [name], [name] ([id number]) -------------------------------------------------------------------------------- Clinic Level of Care Assessment Details Patient Name: Kristin Stare D. Date of Service: 07/21/2015 3:00 PM Medical Record Number: [id number] Patient Account Number: [id number] Date of Birth/Sex: [birth year] (68 y.o. Female) Treating RN: [name] Primary Care Physician: [name] Other Clinician: Referring Physician: Glendon [name] Treating Physician/Extender: [name] in Treatment: 24 Clinic Level of Care Assessment Items TOOL 4 Quantity Score []  - Use when only an EandM is performed on FOLLOW-UP visit 0 ASSESSMENTS - Nursing Assessment / Reassessment X - Reassessment of Co-morbidities (includes updates in patient  status) 1 10 X - Reassessment of Adherence to Treatment Plan 1 5 ASSESSMENTS - Wound and Skin Assessment / Reassessment []  - Simple Wound Assessment / Reassessment - one wound 0 X - Complex Wound Assessment / Reassessment - multiple wounds 2 5 []  - Dermatologic / Skin Assessment (not related to wound area) 0 ASSESSMENTS - Focused Assessment X - Circumferential Edema Measurements - multi extremities 2 5 []  - Nutritional Assessment / Counseling / Intervention 0 X - Lower Extremity Assessment (monofilament, tuning fork, pulses) 1 5 []  - Peripheral Arterial Disease Assessment (using hand held doppler) 0 ASSESSMENTS - Ostomy and/or Continence Assessment and Care []  - Incontinence Assessment and Management 0 []  - Ostomy Care Assessment and Management (repouching, etc.) 0 PROCESS - Coordination of Care X - Simple Patient / Family Education for ongoing care 1 15 []  - Complex (extensive) Patient / Family Education for ongoing care 0 []  - Staff obtains Programmer, systems, Records, Test Results / Process Orders 0 []  - Staff telephones HHA, Nursing Homes / Clarify orders / etc 0 []  - Routine Transfer to another Facility (non-emergent condition) 0 [name], [name] ([id number]) []  - Routine Hospital Admission (non-emergent condition) 0 []  - New Admissions / Biomedical engineer / Ordering NPWT, Apligraf, etc. 0 []  - Emergency Hospital Admission (emergent condition) 0 X - Simple Discharge Coordination 1 10 []  - Complex (extensive) Discharge Coordination 0 PROCESS - Special Needs []  - Pediatric / Minor Patient Management 0 []  - Isolation Patient Management 0 []  - Hearing / Language / Visual special needs 0 []  - Assessment of Community assistance (transportation, D/C planning, etc.) 0 []  - Additional assistance / Altered mentation 0 []  - Support Surface(s) Assessment (bed, cushion, seat, etc.) 0 INTERVENTIONS - Wound Cleansing / Measurement []  - Simple Wound Cleansing - one wound 0 X - Complex Wound  Cleansing - multiple wounds 2 5 X - Wound Imaging (photographs - any number of wounds) 1 5 []  - Wound Tracing (instead of photographs)  0 []  - Simple Wound Measurement - one wound 0 X - Complex Wound Measurement - multiple wounds 2 5 INTERVENTIONS - Wound Dressings X - Small Wound Dressing one or multiple wounds 2 10 []  - Medium Wound Dressing one or multiple wounds 0 []  - Large Wound Dressing one or multiple wounds 0 []  - Application of Medications - topical 0 []  - Application of Medications - injection 0 INTERVENTIONS - Miscellaneous []  - External ear exam 0 [name], [name] ([id number]) []  - Specimen Collection (cultures, biopsies, blood, body fluids, etc.) 0 []  - Specimen(s) / Culture(s) sent or taken to Lab for analysis 0 []  - Patient Transfer (multiple staff / [name]) 0 []  - Simple Staple / Suture removal (25 or less) 0 []  - Complex Staple / Suture removal (26 or more) 0 []  - Hypo / Hyperglycemic Management (close monitor of Blood Glucose) 0 []  - Ankle / Brachial Index (ABI) - do not check if billed separately 0 X - Vital Signs 1 5 Has the patient been seen at the hospital within the last three years: Yes Total Score: 115 Level Of Care: New/Established - Level 3 Electronic Signature(s) Signed: 07/21/2015 5:00:42 PM By: [name] Entered By: [name] on 07/21/2015 15:33:24 [name], [name] ([id number]) -------------------------------------------------------------------------------- Encounter Discharge Information Details Patient Name: [name], [name] Date of Service: 07/21/2015 3:00 PM Medical Record Number: [id number] Patient Account Number: [id number] Date of Birth/Sex: 02-Feb-1946 (68 y.o. Female) Treating RN: [name] Primary Care Physician: [name] Other Clinician: Referring Physician: Glendon [name] Treating Physician/Extender: [name] in Treatment: 24 Encounter Discharge Information Items Discharge Pain Level:  0 Discharge Condition: Stable Ambulatory Status: Ambulatory Discharge Destination: Home Transportation: Private Auto Accompanied By: self Schedule Follow-up Appointment: Yes Medication Reconciliation completed and provided to Patient/Care No [name]: Provided on Clinical Summary of Care: 07/21/2015 Form Type Recipient Paper Patient LM Electronic Signature(s) Signed: 07/21/2015 3:50:52 PM By: Ruthine Dose Entered By: Ruthine Dose on 07/21/2015 15:50:52 Schabel, Tanekia D. ([id number]) -------------------------------------------------------------------------------- Lower Extremity Assessment Details Patient Name: Kristin Stare D. Date of Service: 07/21/2015 3:00 PM Medical Record Number: [id number] Patient Account Number: [id number] Date of Birth/Sex: 20-Jan-1946 (68 y.o. Female) Treating RN: [name] Primary Care Physician: [name] Other Clinician: Referring Physician: Glendon [name] Treating Physician/Extender: [name] in Treatment: 24 Edema Assessment Assessed: [Left: No] [Right: No] Edema: [Left: Yes] [Right: Yes] Calf Left: Right: Point of Measurement: 30 cm From Medial Instep 37 cm 36.6 cm Ankle Left: Right: Point of Measurement: 9 cm From Medial Instep 26.7 cm 25.6 cm Vascular Assessment Pulses: Posterior Tibial Dorsalis Pedis Palpable: [Left:Yes] [Right:Yes] Extremity colors, hair growth, and conditions: Extremity Color: [Left:Hyperpigmented] [Right:Normal] Hair Growth on Extremity: [Left:No] [Right:No] Temperature of Extremity: [Left:Warm] [Right:Warm] Capillary Refill: [Left:< 3 seconds] [Right:< 3 seconds] Electronic Signature(s) Signed: 07/21/2015 5:00:42 PM By: [name] Entered By: [name] on 07/21/2015 15:24:25 [name], [name] ([id number]) -------------------------------------------------------------------------------- Multi Wound Chart Details Patient Name: Kristin Stare D. Date of Service: 07/21/2015 3:00 PM Medical  Record Number: [id number] Patient Account Number: [id number] Date of Birth/Sex: [birth year] (68 y.o. Female) Treating RN: [name] Primary Care Physician: [name] Other Clinician: Referring Physician: Glendon [name] Treating Physician/Extender: [name] in Treatment: 24 Vital Signs Height(in): 65 Pulse(bpm): 74 Weight(lbs): 248 Blood Pressure 152/73 (mmHg): Body Mass Index(BMI): 41 Temperature(F): 98.1 Respiratory Rate 20 (breaths/min): Photos: [2:No Photos] [5:No Photos] [N/A:N/A] Wound Location: [2:Left Lower Leg - Distal Right Lower Leg -] [5:Posterior] [N/A:N/A] Wounding Event: [2:Blister] [5:Gradually Appeared] [  N/A:N/A] Primary Etiology: [2:Venous Leg Ulcer] [5:Venous Leg Ulcer] [N/A:N/A] Comorbid History: [2:Cataracts, Asthma, Coronary Artery Disease, Coronary Artery Disease, Hypertension, Type II Diabetes, Osteoarthritis, Diabetes, Osteoarthritis, Neuropathy] [5:Cataracts, Asthma, Hypertension, Type II Neuropathy] [N/A:N/A] Date Acquired: [2:12/30/2014] [5:04/29/2015] [N/A:N/A] Weeks of Treatment: [2:24] [5:8] [N/A:N/A] Wound Status: [2:Open] [5:Open] [N/A:N/A] Clustered Wound: [2:Yes] [5:No] [N/A:N/A] Measurements L x W x D 6.7x5.3x0.5 [5:1.3x1.6x0.1] [N/A:N/A] (cm) Area (cm) : [2:27.889] [5:1.634] [N/A:N/A] Volume (cm) : [2:13.945] [5:0.163] [N/A:N/A] % Reduction in Area: [2:-3844.70%] [5:-769.10%] [N/A:N/A] % Reduction in Volume: -19540.80% [5:-757.90%] [N/A:N/A] Classification: [2:Full Thickness Without Exposed Support Structures] [5:Unclassifiable] [N/A:N/A] HBO Classification: [2:Grade 1] [5:Grade 1] [N/A:N/A] Exudate Amount: [2:Medium] [5:None Present] [N/A:N/A] Exudate Type: [2:Purulent] [5:N/A] [N/A:N/A] Exudate Color: [2:yellow, brown, green] [5:N/A] [N/A:N/A] Wound Margin: [2:Distinct, outline attached Flat and Intact] [N/A:N/A] Granulation Amount: [2:Medium (34-66%)] [5:None Present (0%)] [N/A:N/A] Granulation Quality: [5:N/A]  [N/A:N/A] Red, Pink, Hyper- granulation Necrotic Amount: Medium (34-66%) Large (67-100%) N/A Necrotic Tissue: Adherent Slough Eschar N/A Exposed Structures: Fascia: No Fascia: No N/A Fat: No Fat: No Tendon: No Tendon: No Muscle: No Muscle: No Joint: No Joint: No Bone: No Bone: No Limited to Skin Limited to Skin Breakdown Breakdown Epithelialization: Small (1-33%) None N/A Periwound Skin Texture: Scarring: Yes Edema: No N/A Edema: No Excoriation: No Excoriation: No Induration: No Induration: No Callus: No Callus: No Crepitus: No Crepitus: No Fluctuance: No Fluctuance: No Friable: No Friable: No Rash: No Rash: No Scarring: No Periwound Skin Moist: Yes Dry/Scaly: Yes N/A Moisture: Maceration: No Maceration: No Dry/Scaly: No Moist: No Periwound Skin Color: Hemosiderin Staining: Yes Hemosiderin Staining: Yes N/A Atrophie Blanche: No Atrophie Blanche: No Cyanosis: No Cyanosis: No Ecchymosis: No Ecchymosis: No Erythema: No Erythema: No Mottled: No Mottled: No Pallor: No Pallor: No Rubor: No Rubor: No Temperature: No Abnormality No Abnormality N/A Tenderness on Yes Yes N/A Palpation: Wound Preparation: Ulcer Cleansing: Ulcer Cleansing: N/A Rinsed/Irrigated with Rinsed/Irrigated with Saline Saline Topical Anesthetic Topical Anesthetic Applied: Other: lidocaine Applied: None 4% Treatment Notes Electronic Signature(s) Signed: 07/21/2015 3:28:25 PM By: [name] Entered By: [name] on 07/21/2015 15:28:25 [name], [name] ([id number]) [place], [place]. ([id number]) -------------------------------------------------------------------------------- Multi-Disciplinary Care Plan Details Patient Name: KAILYNN, SATTERLY D. Date of Service: 07/21/2015 3:00 PM Medical Record Number: [id number] Patient Account Number: [id number] Date of Birth/Sex: 1946/04/15 (68 y.o. Female) Treating RN: [name] Primary Care Physician: [name] Other  Clinician: Referring Physician: Glendon [name] Treating Physician/Extender: [name] in Treatment: 51 Active Inactive Orientation to the Wound Care Program Nursing Diagnoses: Knowledge deficit related to the wound healing center program Goals: Patient/caregiver will verbalize understanding of the [facility] Date Initiated: 01/31/2015 Goal Status: Active Interventions: Provide education on orientation to the wound center Notes: Venous Leg Ulcer Nursing Diagnoses: Potential for venous Insuffiency (use before diagnosis confirmed) Goals: Non-invasive venous studies are completed as ordered Date Initiated: 01/31/2015 Goal Status: Active Patient/caregiver will verbalize understanding of disease process and disease management Date Initiated: 01/31/2015 Goal Status: Active Interventions: Assess peripheral edema status every visit. Notes: Wound/Skin Impairment Nursing Diagnoses: Impaired tissue integrity Knowledge deficit related to smoking impact on wound healing [name], [name] ([id number]) Goals: Patient/caregiver will verbalize understanding of skin care regimen Date Initiated: 01/31/2015 Goal Status: Active Ulcer/skin breakdown will heal within 14 weeks Date Initiated: 01/31/2015 Goal Status: Active Interventions: Assess ulceration(s) every visit Notes: Electronic Signature(s) Signed: 07/21/2015 3:28:04 PM By: [name] Entered By: [name] on 07/21/2015 15:28:03 [name], [name] ([id number]) -------------------------------------------------------------------------------- Patient/Caregiver Education Details Patient Name: Kristin Stare D. Date of Service:  07/21/2015 3:00 PM Medical Record Number: [id number] Patient Account Number: [id number] Date of Birth/Gender: 1946/04/15 (68 y.o. Female) Treating RN: [name] Primary Care Physician: [name] Other Clinician: Referring Physician: Glendon [name] Treating Physician/Extender:  [name] in Treatment: 24 Education Assessment Education Provided To: Patient Education Topics Provided Wound/Skin Impairment: Handouts: Other: wound care as ordered Methods: Demonstration, Explain/Verbal Responses: State content correctly Electronic Signature(s) Signed: 07/21/2015 5:00:42 PM By: [name] Entered By: [name] on 07/21/2015 15:29:36 [name], [name] ([id number]) -------------------------------------------------------------------------------- Wound Assessment Details Patient Name: Kristin Stare D. Date of Service: 07/21/2015 3:00 PM Medical Record Number: [id number] Patient Account Number: [id number] Date of Birth/Sex: [birth year] (68 y.o. Female) Treating RN: [name] Primary Care Physician: [name] Other Clinician: Referring Physician: Glendon [name] Treating Physician/Extender: [name] in Treatment: 24 Wound Status Wound Number: 2 Primary Venous Leg Ulcer Etiology: Wound Location: Left Lower Leg - Distal Wound Open Wounding Event: Blister Status: Date Acquired: 12/30/2014 Comorbid Cataracts, Asthma, Coronary Artery Weeks Of Treatment: 24 History: Disease, Hypertension, Type II Clustered Wound: Yes Diabetes, Osteoarthritis, Neuropathy Photos Photo Uploaded By: [name] on 07/21/2015 16:53:25 Wound Measurements Length: (cm) 6.7 % Reduction in Width: (cm) 5.3 % Reduction in Depth: (cm) 0.5 Epithelializati Area: (cm) 27.889 Tunneling: Volume: (cm) 13.945 Undermining: Area: -3844.7% Volume: -19540.8% on: Small (1-33%) No No Wound Description Full Thickness Without Classification: Exposed Support Structures Diabetic Severity Grade 1 (Wagner): Wound Margin: Distinct, outline attached Exudate Amount: Medium Exudate Type: Purulent Exudate Color: yellow, brown, green Foul Odor After Cleansing: No Wound Bed Granulation Amount: Medium (34-66%) Exposed Structure [name], [name]  ([id number]) Granulation Quality: Red, Pink, Hyper-granulation Fascia Exposed: No Necrotic Amount: Medium (34-66%) Fat Layer Exposed: No Necrotic Quality: Adherent Slough Tendon Exposed: No Muscle Exposed: No Joint Exposed: No Bone Exposed: No Limited to Skin Breakdown Periwound Skin Texture Texture Color No Abnormalities Noted: No No Abnormalities Noted: No Callus: No Atrophie Blanche: No Crepitus: No Cyanosis: No Excoriation: No Ecchymosis: No Fluctuance: No Erythema: No Friable: No Hemosiderin Staining: Yes Induration: No Mottled: No Localized Edema: No Pallor: No Rash: No Rubor: No Scarring: Yes Temperature / Pain Moisture Temperature: No Abnormality No Abnormalities Noted: No Tenderness on Palpation: Yes Dry / Scaly: No Maceration: No Moist: Yes Wound Preparation Ulcer Cleansing: Rinsed/Irrigated with Saline Topical Anesthetic Applied: Other: lidocaine 4%, Treatment Notes Wound #2 (Left, Distal Lower Leg) 1. Cleansed with: Clean wound with Normal Saline 2. Anesthetic Topical Lidocaine 4% cream to wound bed prior to debridement 4. Dressing Applied: Santyl Ointment 5. Secondary Dressing Applied Gauze and Kerlix/Conform 7. Secured with Tape Notes iodoform packing strip to proximal satellite; santyl to main wound Electronic Signature(s) [name], [name] ([id number]) Signed: 07/21/2015 5:00:42 PM By: [name] Entered By: [name] on 07/21/2015 15:22:14 [name], [name] ([id number]) -------------------------------------------------------------------------------- Wound Assessment Details Patient Name: Dahmen, [name] Date of Service: 07/21/2015 3:00 PM Medical Record Number: [id number] Patient Account Number: [id number] Date of Birth/Sex: 11-09-1945 (68 y.o. Female) Treating RN: [name] Primary Care Physician: [name] Other Clinician: Referring Physician: Glendon [name] Treating Physician/Extender: [name] in  Treatment: 24 Wound Status Wound Number: 5 Primary Venous Leg Ulcer Etiology: Wound Location: Right Lower Leg - Posterior Wound Open Wounding Event: Gradually Appeared Status: Date Acquired: 04/29/2015 Comorbid Cataracts, Asthma, Coronary Artery Weeks Of Treatment: 8 History: Disease, Hypertension, Type II Clustered Wound: No Diabetes, Osteoarthritis, Neuropathy Photos Photo Uploaded By: [name] on 07/21/2015 16:53:25 Wound Measurements Length: (cm) 1.3 Width: (cm) 1.6 Depth: (cm) 0.1 Area: (  cm) 1.634 Volume: (cm) 0.163 % Reduction in Area: -769.1% % Reduction in Volume: -757.9% Epithelialization: None Tunneling: No Undermining: No Wound Description Classification: Unclassifiable Diabetic Severity (Wagner): Grade 1 Wound Margin: Flat and Intact Exudate Amount: None Present Foul Odor After Cleansing: No Wound Bed Granulation Amount: None Present (0%) Exposed Structure Necrotic Amount: Large (67-100%) Fascia Exposed: No Necrotic Quality: Eschar Fat Layer Exposed: No Tendon Exposed: No Muscle Exposed: No Kryder, Sareen D. ([id number]) Joint Exposed: No Bone Exposed: No Limited to Skin Breakdown Periwound Skin Texture Texture Color No Abnormalities Noted: No No Abnormalities Noted: No Callus: No Atrophie Blanche: No Crepitus: No Cyanosis: No Excoriation: No Ecchymosis: No Fluctuance: No Erythema: No Friable: No Hemosiderin Staining: Yes Induration: No Mottled: No Localized Edema: No Pallor: No Rash: No Rubor: No Scarring: No Temperature / Pain Moisture Temperature: No Abnormality No Abnormalities Noted: No Tenderness on Palpation: Yes Dry / Scaly: Yes Maceration: No Moist: No Wound Preparation Ulcer Cleansing: Rinsed/Irrigated with Saline Topical Anesthetic Applied: None Treatment Notes Wound #5 (Right, Posterior Lower Leg) 1. Cleansed with: Clean wound with Normal Saline 4. Dressing Applied: Other dressing (specify in notes) 5.  Secondary Dressing Applied Bordered Foam Dressing Notes betadine paint to the right posterior Electronic Signature(s) Signed: 07/21/2015 5:00:42 PM By: [name] Entered By: [name] on 07/21/2015 15:21:50 Shed, Alliana D. ([id number]) -------------------------------------------------------------------------------- Vitals Details Patient Name: Kristin Stare D. Date of Service: 07/21/2015 3:00 PM Medical Record Number: [id number] Patient Account Number: [id number] Date of Birth/Sex: [birth year] (68 y.o. Female) Treating RN: [name] Primary Care Physician: [name] Other Clinician: Referring Physician: Glendon [name] Treating Physician/Extender: [name] in Treatment: 24 Vital Signs Time Taken: 15:15 Temperature (F): 98.1 Height (in): 65 Pulse (bpm): 74 Weight (lbs): 248 Respiratory Rate (breaths/min): 20 Body Mass Index (BMI): 41.3 Blood Pressure (mmHg): 152/73 Reference Range: 80 - 120 mg / dl Electronic Signature(s) Signed: 07/21/2015 5:00:42 PM By: [name] Entered By: [name] on 07/21/2015 15:17:24

## 2015-07-28 ENCOUNTER — Encounter: Payer: PPO | Attending: Surgery | Admitting: Surgery

## 2015-07-28 DIAGNOSIS — I890 Lymphedema, not elsewhere classified: Secondary | ICD-10-CM | POA: Insufficient documentation

## 2015-07-28 DIAGNOSIS — I83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: Secondary | ICD-10-CM | POA: Diagnosis not present

## 2015-07-28 DIAGNOSIS — I83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: Secondary | ICD-10-CM | POA: Insufficient documentation

## 2015-07-28 DIAGNOSIS — L97322 Non-pressure chronic ulcer of left ankle with fat layer exposed: Secondary | ICD-10-CM | POA: Insufficient documentation

## 2015-07-28 DIAGNOSIS — L03116 Cellulitis of left lower limb: Secondary | ICD-10-CM | POA: Diagnosis not present

## 2015-07-28 DIAGNOSIS — E11622 Type 2 diabetes mellitus with other skin ulcer: Secondary | ICD-10-CM | POA: Diagnosis not present

## 2015-07-28 NOTE — Progress Notes (Addendum)
[name], [name] ([id number]) Visit Report for 07/28/2015 Chief Complaint Document Details Patient Name: [name], [name] 07/28/2015 3:30 Date of Service: PM Medical Record [id number] Number: Patient Account Number: [id number] December 21, 1945 (69 y.o. Treating RN: [name] Date of Birth/Sex: Female) Other Clinician: Primary Care Physician: [facility], Delana Meyer Treating [name] Referring Physician: Glendon Axe Physician/Extender: Weeks in Treatment: 25 Information Obtained from: Patient Chief Complaint Patient presents to the wound care center for a consult due non healing wound 69 year old patient comes with a history of having a ulcer on the left lower extremity for the past 4 weeks. she says she's had swelling of both lower extremities for about a year after she started having prednisone. 02/07/2015 -- her vascular appointments obtained were in the first and third week of June. she is able to go to [place] and we will try and get her some earlier appointments. Other than that nothing else has changed in her management. Electronic Signature(s) Signed: 07/28/2015 4:10:04 PM By: [name], [name] Entered By: [name] on 07/28/2015 16:10:04 [name], [name] ([id number]) -------------------------------------------------------------------------------- HPI Details Patient Name: [name], [name] 07/28/2015 3:30 Date of Service: PM Medical Record [id number] Number: Patient Account Number: [id number] 1945/11/01 (69 y.o. Treating RN: [name] Date of Birth/Sex: Female) Other Clinician: Primary Care Physician: [facility], Delana Meyer Treating Ryla Cauthon Referring Physician: Glendon Axe Physician/Extender: Weeks in Treatment: 25 History of Present Illness HPI Description: 69 year old patient who is known to have diabetes mellitus type 2, chronic renal insufficiency, coronary artery disease, hypertension, hypercholesterolemia, temporal arteritis and inflammatory arthritiss also  has a history of having a hysterectomy and some orthopedic related surgeries. The ulcer on the left lower extremity started off as a blister and then. Got progressively worse. She does not have any fever or chills and has not had any recent surgical intervention for this. Her last hemoglobin A1c was 10.1 in September 2015. She has been recently put on doxycycline by her PCP. She is now also allergic to doxycycline and was this was changed over to Keflex. 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 She does see a cardiologist and also takes a diuretic. 02/07/2015 her arterial and venous duplex studies to be done have dates been given as the first and third week of June. This is at [facility]. We are going to try and get early appointments at [facility]. other than that nothing has changed in her management. 02/14/2015 -- we have been able to get her an appointment in [facility] on May 20 which is much earlier than her previous ones at [name]. She continues with her prednisone and her sugars are in the range of 150-200. 02/21/2015 We were able to get a vascular lab workup for her today and she is going to be there at 2:00 this afternoon. the swelling of her leg has gone down significantly but she still has some tenderness over the wounds. 02/28/2015 - She has had one of two vascular workups done, and this coming Tuesday has another, at [facility]. She continues to be on steroid medications. She has significant sensitivity in her left lower extremity and has pain suggestive of neuropathic pain and I have asked her to address this with her primary care physician. 03/07/2015 -- The patient saw Dr. Lucky Cowboy for a consultation and he has had her arteries are okay but she has 2 incompetent veins on the  left lower extremity and he is going to set her up for surgery.  Official report is awaited. Addendum: Official reports are now available and on 03/04/2015. She was seen and lower extremity venous duplex exam was done. There was reflux present within the left greater saphenous vein below the knee and also the left small saphenous vein. Arterial duplex showed normal triphasic waveforms throughout the left lower extremity without any significant stenosis. Her ABIs were noncompressible bilaterally but a waveforms were normal and a digital pressures were normal bilaterally consistent with no significant arterial insufficiency. He has recommended endovenous ablation of both the left small saphenous and the left great saphenous vein. This would still be scheduled later. 03/14/2015 -- she has heard back from the vascular office and has surgery scheduled for sometime in July. AAVA, DELAND ([id number]) Her rheumatologist has decreased her prednisone dosage but she still on it. She has also had cataract surgery in her right eye recently this week. 03/20/2015 - No new complaints today. Pain improved. No fever or chills. Tolerating 2 layer compression. 04/14/2015 -- she was doing very well today she went off on vacation and now her edema has increased markedly the ulceration is bigger and her diabetes is not under control. 04/21/2015 -- I spoke to her PCP Dr. Candiss Norse and discussed the management which would include being seen by a general surgeon for debridement and taking multiple punch biopsies which would help in establishing the diagnosis of this is a vasculitis. She is agreeable about this and will set her up for the procedure with Dr. Tamala Julian at [facility]. She was seen by the surgeon Dr. Jamal Collin. 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 She is scheduled to have laser ablation done next week.Pt was sent here for possible biopsy to look for  vasculitis. Feel it would be better to wait after laser ablation is completed- the ulcer may heal fully and biopsy may not be necessary 04/29/2015 -- she had the venous ablation done by Dr. Lucky Cowboy last Friday and we do not have any notes yet. She is doing fine otherwise. 05/06/2015 --Review of her recent vascular intervention shows that she was seen by Dr. Lucky Cowboy on 04/29/2015. The follow-up duplex which was done showed that both the great saphenous vein and the small saphenous vein remained patent with reflux consistent with an unsuccessful ablation. He has rescheduled her for another the endovenous ablation to be done in about 4 weekso time. 05/13/2015 -- he was seen by her surgeon Dr. Jamal Collin who asked her to continue with conservative therapy and he would speak to Dr. Lucky Cowboy about her management. Dr. Lucky Cowboy is going to schedule her surgery in the middle of August for a repeat endovenous ablation. Her pus culture from last week has grown : [place] her noted her sensitivity report but due to her multiple allergies I had tried clindamycin and she developed a rash with this too. She has been prescribed and anti-buttocks in the ER and is has it at home and she will let is know what she is going to be taking. 05/20/2015 -- she has developed a small spot on her right lower extremity but besides that it is not a full fledged ulceration. She did not get to see Dr. Lucky Cowboy last week and hopefully she will see him in the near future. 05/27/2015 -she is still awaiting her  appointment with Dr.Dew and her vascular procedure is not scheduled until August 19. She will be seeing her PCP tomorrow and I have asked her to convey our discussion so that she is aware that debridement has not been done yet. 06/03/2015 -- was seen by her rheumatologist Dr. Dorthula Matas, who has been treating her for temporal arteritis and  in his note has mentioned the possibility of vasculitis or pyoderma gangrenosum. He is lowering her prednisone to 12-1/2 mg for 1 month and then 10 mg per the next month. I will again make an attempt to speak to her PCP Dr. Candiss Norse and her surgeon Dr. 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 06/17/2015 -- Dr.Dew did her vascular procedure last week and a follow-up venous ultrasound shows good resolution of the veins as per the patient's history. He is to see her back in 2 weeks. [name], [name] ([id number]) 07/07/2015. -- the patient has had a heavy growth of Proteus mirabilis and Enterococcus faecalis. These are sensitive to several drugs but the problem is she has allergies to all of these and hence I would like her to see Dr. Ola Spurr for this. She is also due to see Dr. Lucky Cowboy tomorrow and I will discussed the management with him including debridement under anesthesia and possible biopsies. 07/14/2015 -- she has an appointment to see Dr. Ola Spurr tomorrow and did see Dr. Bunnie Domino PA who will discuss my request with him. 07/21/2015 -- saw Dr. Ola Spurr was able to do a test on her and has put her on amoxicillin. She has been tolerating that and has had no problems with allergies to this. 07/28/2015 -- Last Friday I spoke to Dr. Leotis Pain regarding her care and he said that her right leg did not need any surgery and on the left leg was doing pretty good. We did agree that if she undergoes any procedure in the future he would do a couple of punch biopsies of the wound. Electronic Signature(s) Signed: 07/28/2015 4:10:15 PM By: [name], [name] Previous Signature: 07/28/2015 3:50:26 PM Version By: [name], [name] Entered By: [name] on 07/28/2015 16:10:15 [name], [name] ([id number]) -------------------------------------------------------------------------------- Physical Exam  Details Patient Name: JOMAIRA, DARR D. 07/28/2015 3:30 Date of Service: PM Medical Record [id number] Number: Patient Account Number: [id number] Jan 27, 1946 (69 y.o. Treating RN: [name] Date of Birth/Sex: Female) Other Clinician: Primary Care Physician: [facility], Delana Meyer Treating [name] Referring Physician: Glendon Axe Physician/Extender: Weeks in Treatment: 25 Constitutional . Pulse regular. Respirations normal and unlabored. Afebrile. . Eyes Nonicteric. Reactive to light. Ears, Nose, Mouth, and Throat Lips, teeth, and gums WNL.Marland Kitchen Moist mucosa without lesions . Neck supple and nontender. No palpable supraclavicular or cervical adenopathy. Respiratory Breath sounds WNL, No rubs, rales, rhonchi, or wheeze.. Cardiovascular Heart rhythm and rate regular, no murmur or gallop.. Pedal Pulses WNL. No clubbing, cyanosis or edema. Lymphatic No adneopathy. No adenopathy. No adenopathy. Musculoskeletal Adexa without tenderness or enlargement.. Digits and nails w/o clubbing, cyanosis, infection, petechiae, ischemia, or inflammatory conditions.. Integumentary (Hair, Skin) No suspicious lesions. No crepitus or fluctuance. No peri-wound warmth or erythema. No masses.Marland Kitchen Psychiatric Judgement and insight Intact.. No evidence of depression, anxiety, or agitation.. Notes The right leg eschar and ulcer is fairly tender but there is no surrounding cellulitis. The ulcer at the 12:00 position on the superior part of the wound has some undermining and this is about  2 cm. Main wound is looking much cleaner than before and has no surrounding cellulitis. Electronic Signature(s) Signed: 07/28/2015 4:11:28 PM By: [name], [name] Entered By: [name] on 07/28/2015 16:11:28 [name], [name] ([id number]) -------------------------------------------------------------------------------- Physician Orders Details Patient Name: BRIGHTON, DELIO D. 07/28/2015 3:30 Date of Service: PM Medical  Record [id number] Number: Patient Account Number: [id number] [birth year] (69 y.o. Treating RN: [name] Date of Birth/Sex: Female) Other Clinician: Primary Care Physician: [facility], Delana Meyer Treating [name] Referring Physician: Glendon Axe Physician/Extender: Suella [place] in Treatment: 25 Verbal / Phone Orders: Yes Clinician: [name] Read Back and Verified: Yes Diagnosis Coding Wound Cleansing Wound #2 Left,Distal Lower Leg o Clean wound with Normal Saline. Wound #5 Right,Posterior Lower Leg o Clean wound with Normal Saline. Anesthetic Wound #2 Left,Distal Lower Leg o Topical Lidocaine 4% cream applied to wound bed prior to debridement Wound #5 Right,Posterior Lower Leg o Topical Lidocaine 4% cream applied to wound bed prior to debridement Primary Wound Dressing Wound #2 Left,Distal Lower Leg o Iodoform packing Gauze - packed into proximal satellite area o Other: - Cutimed Siltec Sorbact Wound #5 Right,Posterior Lower Leg o Other: - betadine paint Secondary Dressing Wound #5 Right,Posterior Lower Leg o Boardered Foam Dressing Dressing Change Frequency Wound #2 Left,Distal Lower Leg o Change dressing every other day. - daily if necessary due to drainage Wound #5 Right,Posterior Lower Leg o Change dressing every day. Follow-up Appointments [name], [name] ([id number]) Wound #2 Left,Distal Lower Leg o Return Appointment in 1 week. Wound #5 Right,Posterior Lower Leg o Return Appointment in 1 week. Edema Control Wound #2 Left,Distal Lower Leg o Support Garment 20-30 mm/Hg pressure to: Wound #5 Right,Posterior Lower Leg o Support Garment 20-30 mm/Hg pressure to: Additional Orders / Instructions Wound #2 Left,Distal Lower Leg o Increase protein intake. Wound #5 Right,Posterior Lower Leg o Increase protein intake. Medications-please add to medication list. Wound #2 Left,Distal Lower Leg o P.O. Antibiotics - Complete antibiotics  prescribed by Dr. Ola Spurr Wound #5 Right,Posterior Lower Leg o P.O. Antibiotics - Complete antibiotics prescribed by Dr. Ola Spurr Electronic Signature(s) Signed: 07/28/2015 4:36:30 PM By: [name], [name] Signed: 07/29/2015 5:33:23 PM By: [name] RN, [name], [name], [name] Entered By: [name], RN, [name], Kim on 07/28/2015 16:20:44 [name], [name] ([id number]) -------------------------------------------------------------------------------- Problem List Details Patient Name: ZAMARIAH, SEABORN 07/28/2015 3:30 Date of Service: PM Medical Record [id number] Number: Patient Account Number: [id number] Oct 01, 1946 (69 y.o. 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 ([id number]) [name], [name] ([id number]) -------------------------------------------------------------------------------- Progress Note Details Patient Name: JAANA, BRODT D. 07/28/2015 3:30 Date of Service: PM Medical Record [id number] Number: Patient Account Number:  [id number] 06/29/1946 (69 y.o. Treating RN: [name] Date of Birth/Sex: Female) Other Clinician: Primary Care  Physician: Glendon Axe Treating [name] Referring Physician: Glendon Axe Physician/Extender: Weeks in Treatment: 25 Subjective Chief Complaint Information obtained from Patient Patient presents to the wound care center for a consult due non healing wound 69 year old patient comes with a history of having a ulcer on the left lower extremity for the past 4 weeks. she says she's had swelling of both lower extremities for about a year after she started having prednisone. 02/07/2015 -- her vascular appointments obtained were in the first and third week of June. she is able to go to [place] and we will try and get her some earlier appointments. Other than that nothing else has changed in her management. History of Present Illness (HPI) 69 year old patient who is known to have diabetes mellitus type 2, chronic renal insufficiency, coronary artery disease, hypertension, hypercholesterolemia, temporal arteritis and inflammatory arthritiss also has a history of having a hysterectomy and some orthopedic related surgeries. The ulcer on the left lower extremity started off as a blister and then. Got progressively worse. She does not have any fever or chills and has not had any recent surgical intervention for this. Her last hemoglobin A1c was 10.1 in September 2015. She has been recently put on doxycycline by her PCP. She is now also allergic to doxycycline and was this was changed over to Keflex. 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 She does see a cardiologist and also takes a diuretic. 02/07/2015 her arterial and venous duplex studies to be done have dates been given as the first and third week of June. This is at [facility]. We are going to try and get  early appointments at [facility]. other than that nothing has changed in her management. 02/14/2015 -- we have been able to get her an appointment in [facility] on May 20 which is much earlier than her previous ones at [place]. She continues with her prednisone and her sugars are in the range of 150-200. 02/21/2015 We were able to get a vascular lab workup for her today and she is going to be there at 2:00 this afternoon. the swelling of her leg has gone down significantly but she still has some tenderness over the wounds. 02/28/2015 - She has had one of two vascular workups done, and this coming Tuesday has another, at [facility]. She continues to be on steroid medications. She has significant sensitivity in her left lower extremity and has pain suggestive of neuropathic pain and I have asked her to address this with her primary care physician. 03/07/2015 -- The patient saw Dr. Lucky Cowboy for a consultation and he has had her arteries are okay but she has 2 incompetent veins on the left lower extremity and he is going to set her up for surgery. Official report is KATERYNA, GRANTHAM ([id number]) awaited. Addendum: Official reports are now available and on 03/04/2015. She was seen and lower extremity venous duplex exam was done. There was reflux present within the left greater saphenous vein below the knee and also the left small saphenous vein. Arterial duplex showed normal triphasic waveforms throughout the left lower extremity without any significant stenosis. Her ABIs were noncompressible bilaterally but a waveforms were normal and a digital pressures were normal bilaterally consistent with no significant arterial insufficiency. He has recommended endovenous ablation of both the left small saphenous and the left great saphenous vein. This would still be  scheduled later. 03/14/2015 -- she has heard back from the vascular office and has surgery scheduled for sometime in  July. Her rheumatologist has decreased her prednisone dosage but she still on it. She has also had cataract surgery in her right eye recently this week. 03/20/2015 - No new complaints today. Pain improved. No fever or chills. Tolerating 2 layer compression. 04/14/2015 -- she was doing very well today she went off on vacation and now her edema has increased markedly the ulceration is bigger and her diabetes is not under control. 04/21/2015 -- I spoke to her PCP Dr. Candiss Norse and discussed the management which would include being seen by a general surgeon for debridement and taking multiple punch biopsies which would help in establishing the diagnosis of this is a vasculitis. She is agreeable about this and will set her up for the procedure with Dr. Tamala Julian at [facility]. She was seen by the surgeon Dr. Jamal Collin. 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 She is scheduled to have laser ablation done next week.Pt was sent here for possible biopsy to look for vasculitis. Feel it would be better to wait after laser ablation is completed- the ulcer may heal fully and biopsy may not be necessary 04/29/2015 -- she had the venous ablation done by Dr. Lucky Cowboy last Friday and we do not have any notes yet. She is doing fine otherwise. 05/06/2015 --Review of her recent vascular intervention shows that she was seen by Dr. Lucky Cowboy on 04/29/2015. The follow-up duplex which was done showed that both the great saphenous vein and the small saphenous vein remained patent with reflux consistent with an unsuccessful ablation. He has rescheduled her for another the endovenous ablation to be done in about 4 weeks time. 05/13/2015 -- he was seen by her surgeon Dr. Jamal Collin who asked her to continue with conservative therapy and he would speak to Dr. Lucky Cowboy about her management. Dr. Lucky Cowboy is going to schedule her surgery in  the middle of August for a repeat endovenous ablation. Her pus culture from last week has grown : [place] her noted her sensitivity report but due to her multiple allergies I had tried clindamycin and she developed a rash with this too. She has been prescribed and anti-buttocks in the ER and is has it at home and she will let is know what she is going to be taking. 05/20/2015 -- she has developed a small spot on her right lower extremity but besides that it is not a full fledged ulceration. She did not get to see Dr. Lucky Cowboy last week and hopefully she will see him in the near future. 05/27/2015 -she is still awaiting her appointment with Dr.Dew and her vascular procedure is not scheduled until August 19. She will be seeing her PCP tomorrow and I have asked her to convey our discussion so that she is aware that debridement has not been done yet. LARESA, OSHIRO ([id number]) 06/03/2015 -- was seen by her rheumatologist Dr. 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 He is lowering her prednisone to 12-1/2 mg for 1 month and then 10 mg per the next month. I will again make an attempt to speak to her PCP Dr. Candiss Norse and her surgeon Dr. Lucky Cowboy to see if he can organize  for a debridement in the OR with multiple biopsies to establish a diagnosis of vasculitis or pyoderma gangrenosum. 06/17/2015 -- Dr.Dew did her vascular procedure last week and a follow-up venous ultrasound shows good resolution of the veins as per the patient's history. He is to see her back in 2 weeks. 07/07/2015. -- the patient has had a heavy growth of Proteus mirabilis and Enterococcus faecalis. These are sensitive to several drugs but the problem is she has allergies to all of these and hence I would like her to see Dr.  Ola Spurr for this. She is also due to see Dr. Lucky Cowboy tomorrow and I will discussed the management with him including debridement under anesthesia and possible biopsies. 07/14/2015 -- she has an appointment to see Dr. Ola Spurr tomorrow and did see Dr. Bunnie Domino PA who will discuss my request with him. 07/21/2015 -- saw Dr. Ola Spurr was able to do a test on her and has put her on amoxicillin. She has been tolerating that and has had no problems with allergies to this. 07/28/2015 -- Last Friday I spoke to Dr. Leotis Pain regarding her care and he said that her right leg did not need any surgery and on the left leg was doing pretty good. We did agree that if she undergoes any procedure in the future he would do a couple of punch biopsies of the wound. Objective Constitutional Pulse regular. Respirations normal and unlabored. Afebrile. Vitals Time Taken: 3:43 PM, Height: 65 in, Weight: 248 lbs, BMI: 41.3, Temperature: 97.7 F, Pulse: 76 bpm, Respiratory Rate: 20 breaths/min, Blood Pressure: 135/63 mmHg. Eyes Nonicteric. Reactive to light. Ears, Nose, Mouth, and Throat Lips, teeth, and gums WNL.Marland Kitchen Moist mucosa without lesions . Neck supple and nontender. No palpable supraclavicular or cervical adenopathy. Respiratory Breath sounds WNL, No rubs, rales, rhonchi, or wheeze.Marland Kitchen Walling, [place] D. ([id number]) Cardiovascular Heart rhythm and rate regular, no murmur or gallop.. Pedal Pulses WNL. No clubbing, cyanosis or edema. Lymphatic No adneopathy. No adenopathy. No adenopathy. Musculoskeletal Adexa without tenderness or enlargement.. Digits and nails w/o clubbing, cyanosis, infection, petechiae, ischemia, or inflammatory conditions.Marland Kitchen Psychiatric Judgement and insight Intact.. No evidence of depression, anxiety, or agitation.. General Notes: The right leg eschar and ulcer is fairly tender but there is no surrounding cellulitis. The ulcer at the 12:00 position on the superior part of the wound has some  undermining and this is about 2 cm. Main wound is looking much cleaner than before and has no surrounding cellulitis. Integumentary (Hair, Skin) No suspicious lesions. No crepitus or fluctuance. No peri-wound warmth or erythema. No masses.. Wound #2 status is Open. Original cause of wound was Blister. The wound is located on the Left,Distal Lower Leg. The wound measures 7.4cm length x 5.3cm width x 0.5cm depth; 30.803cm^2 area and 15.402cm^3 volume. The wound is limited to skin breakdown. There is no tunneling or undermining noted. There is a medium amount of purulent drainage noted. The wound margin is distinct with the outline attached to the wound base. There is small (1-33%) red, pink granulation within the wound bed. There is a large (67-100%) amount of necrotic tissue within the wound bed including Adherent Slough. The periwound skin appearance exhibited: Scarring, Moist, Hemosiderin Staining. The periwound skin appearance did not exhibit: Callus, Crepitus, Excoriation, Fluctuance, Friable, Induration, Localized Edema, Rash, Dry/Scaly, Maceration, Atrophie Blanche, Cyanosis, Ecchymosis, Mottled, Pallor, Rubor, Erythema. Periwound temperature was noted as No Abnormality. The periwound has tenderness on palpation. Wound #5 status is Open. Original cause of wound was Gradually Appeared. The  wound is located on the Right,Posterior Lower Leg. The wound measures 1.6cm length x 2.1cm width x 0.1cm depth; 2.639cm^2 area and 0.264cm^3 volume. The wound is limited to skin breakdown. There is no tunneling or undermining noted. There is a none present amount of drainage noted. The wound margin is distinct with the outline attached to the wound base. There is no granulation within the wound bed. There is a large (67-100%) amount of necrotic tissue within the wound bed including Eschar. The periwound skin appearance exhibited: Dry/Scaly, Hemosiderin Staining. The periwound skin appearance did not  exhibit: Callus, Crepitus, Excoriation, Fluctuance, Friable, Induration, Localized Edema, Rash, Scarring, Maceration, Moist, Atrophie Blanche, Cyanosis, Ecchymosis, Mottled, Pallor, Rubor, Erythema. Periwound temperature was noted as No Abnormality. The periwound has tenderness on palpation. 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 she is using compression stockings. He was continued on amoxicillin as per Dr. Ola Spurr. She will see me back next week. Plan Wound Cleansing: Wound #2 Left,Distal Lower Leg: Clean wound with Normal Saline. Wound #5 Right,Posterior Lower Leg: Clean wound with Normal Saline. Anesthetic: Wound #2 Left,Distal Lower Leg: Topical Lidocaine 4% cream applied to wound bed prior to debridement Wound #5 Right,Posterior Lower Leg: Topical Lidocaine 4% cream applied to wound bed prior to debridement Primary Wound Dressing: Wound #2 Left,Distal Lower Leg: Iodoform packing Gauze - packed into proximal satellite area Other: - Cutimed Siltec Sorbact Wound #5 Right,Posterior Lower Leg: Other: - betadine paint Secondary Dressing: Wound #5 Right,Posterior Lower Leg: Boardered Foam Dressing Dressing Change Frequency: Wound #2 Left,Distal Lower Leg: Change dressing every other day. - daily if necessary due to drainage Wound #5 Right,Posterior Lower Leg: Change dressing every  day. Follow-up Appointments: Wound #2 Left,Distal Lower Leg: Dagostino, Keiyana D. ([id number]) Return Appointment in 1 week. Wound #5 Right,Posterior Lower Leg: Return Appointment in 1 week. Edema Control: Wound #2 Left,Distal Lower Leg: Support Garment 20-30 mm/Hg pressure to: Wound #5 Right,Posterior Lower Leg: Support Garment 20-30 mm/Hg pressure to: Additional Orders / Instructions: Wound #2 Left,Distal Lower Leg: Increase protein intake. Wound #5 Right,Posterior Lower Leg: Increase protein intake. Medications-please add to medication list.: Wound #2 Left,Distal Lower Leg: P.O. Antibiotics - Complete antibiotics prescribed by Dr. Ola Spurr Wound #5 Right,Posterior Lower Leg: P.O. Antibiotics - Complete antibiotics prescribed by Dr. Ola Spurr I have recommended packing the sinus tract with quarter-inch iodoform gauze and using Siltec Sorbact on the main wound. she is using compression stockings. He was continued on amoxicillin as per Dr. Ola Spurr. She will see me back next week. Electronic Signature(s) Signed: 07/28/2015 4:33:22 PM By: [name], [name] Previous Signature: 07/28/2015 4:13:03 PM Version By: [name], [name] Entered By: [name] on 07/28/2015 16:33:22 Mabe, Mirian D. ([id number]) -------------------------------------------------------------------------------- SuperBill Details Patient Name: JEIRY, BIRNBAUM D. Date of Service: 07/28/2015 Medical Record Number: [id number] Patient Account Number: [id number] Date of Birth/Sex: 1945/11/02 (68 y.o. 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 L03.116 Cellulitis of left lower limb Facility Procedures CPT4 Code: [id number] Description: 99213 - WOUND CARE VISIT-LEV 3 EST PT Modifier: Quantity: 1 Physician Procedures CPT4: Description Modifier Quantity Code [id number] 99213 - WC PHYS LEVEL 3 - EST PT 1 ICD-10 Description Diagnosis E11.622 Type 2 diabetes mellitus with other skin ulcer L97.322 Non-pressure chronic ulcer of left ankle with fat layer exposed I89.0  Lymphedema, not elsewhere classified 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

## 2015-07-29 NOTE — Progress Notes (Signed)
[name], [name] ([id number]) Visit Report for 07/14/2015 Arrival Information Details Patient Name: [name] Date of Service: 07/14/2015 1:00 PM Medical Record Number: [id number] Patient Account Number: [id number] Date of Birth/Sex: October 19, 1946 (68 y.o. Female) Treating RN: Junious Dresser Primary Care Physician: Glendon Axe Other Clinician: Referring Physician: Glendon Axe Treating Physician/Extender: Frann Rider in Treatment: 23 Visit Information History Since Last Visit Added or deleted any medications: No Patient Arrived: Cane Any new allergies or adverse reactions: No Arrival Time: 13:13 Had a fall or experienced change in No Accompanied By: self activities of daily living that may affect Transfer Assistance: None risk of falls: Patient Identification Verified: Yes Signs or symptoms of abuse/neglect since last No Secondary Verification Process Yes visito Completed: Hospitalized since last visit: No Patient Has Alerts: Yes Has Dressing in Place as Prescribed: Yes Patient Alerts: Patient on Blood Has Compression in Place as Prescribed: No Thinner Pain Present Now: Yes Electronic Signature(s) Signed: 07/28/2015 4:32:11 PM By: Junious Dresser RN Entered By: Junious Dresser on 07/14/2015 13:16:26 [name], [name] ([id number]) -------------------------------------------------------------------------------- Encounter Discharge Information Details Patient Name: [name] D. Date of Service: 07/14/2015 1:00 PM Medical Record Number: [id number] Patient Account Number: [id number] Date of Birth/Sex: [birth year] (68 y.o. Female) Treating RN: Junious Dresser Primary Care Physician: Glendon Axe Other Clinician: Referring Physician: Glendon Axe Treating Physician/Extender: Frann Rider in Treatment: 48 Encounter Discharge Information Items Discharge Condition: Stable Ambulatory Status: Ambulatory Discharge Destination:  Home Private Transportation: Auto Accompanied By: self Schedule Follow-up Appointment: Yes Medication Reconciliation completed and No provided to Patient/Care Provider: Clinical Summary of Care: Electronic Signature(s) Signed: 07/28/2015 4:32:11 PM By: Junious Dresser RN Entered By: Junious Dresser on 07/14/2015 16:24:26 [name], [name] ([id number]) -------------------------------------------------------------------------------- Lower Extremity Assessment Details Patient Name: [name] D. Date of Service: 07/14/2015 1:00 PM Medical Record Number: [id number] Patient Account Number: [id number] Date of Birth/Sex: [birth year] (68 y.o. Female) Treating RN: Junious Dresser Primary Care Physician: Glendon Axe Other Clinician: Referring Physician: Glendon Axe Treating Physician/Extender: Frann Rider in Treatment: 23 Edema Assessment Assessed: [Left: Yes] [Right: Yes] Edema: [Left: Yes] [Right: Yes] Calf Left: Right: Point of Measurement: 30 cm From Medial Instep 36.5 cm 37.3 cm Ankle Left: Right: Point of Measurement: 9 cm From Medial Instep 24.2 cm 23.4 cm Vascular Assessment Claudication: Claudication Assessment [Left:None] [Right:None] Pulses: Posterior Tibial Dorsalis Pedis Palpable: [Left:Yes] [Right:Yes] Extremity colors, hair growth, and conditions: Extremity Color: [Left:Hyperpigmented] [Right:Hyperpigmented] Hair Growth on Extremity: [Left:No] [Right:No] Temperature of Extremity: [Left:Warm] [Right:Warm] Capillary Refill: [Left:< 3 seconds] [Right:< 3 seconds] Dependent Rubor: [Left:No] [Right:No] Blanched when Elevated: [Left:No] [Right:No] Toe Nail Assessment Left: Right: Thick: No No Discolored: Yes Yes Deformed: No No Improper Length and Hygiene: No No Electronic Signature(s) [name] ([id number]) Signed: 07/28/2015 4:32:11 PM By: Junious Dresser RN Entered By: Junious Dresser on 07/14/2015 13:25:23 [name], [name]  ([id number]) -------------------------------------------------------------------------------- Multi Wound Chart Details Patient Name: [name] D. Date of Service: 07/14/2015 1:00 PM Medical Record Number: [id number] Patient Account Number: [id number] Date of Birth/Sex: 11/26/1945 (68 y.o. Female) Treating RN: [name] Primary Care Physician: Glendon Axe Other Clinician: Referring Physician: Glendon Axe Treating Physician/Extender: Frann Rider in Treatment: 23 Vital Signs Height(in): 65 Pulse(bpm): 73 Weight(lbs): 248 Blood Pressure 139/59 (mmHg): Body Mass Index(BMI): 41 Temperature(F): 97.7 Respiratory Rate 20 (breaths/min): Photos: [2:No Photos] [5:No Photos] [N/A:N/A] Wound Location: [2:Left Lower Leg - Distal Right Lower Leg -] [5:Posterior] [N/A:N/A] Wounding Event: [2:Blister] [5:Gradually Appeared] [N/A:N/A] Primary Etiology: [2:Venous Leg Ulcer] [5:Venous Leg Ulcer] [N/A:N/A] Comorbid History: [  2:Cataracts, Asthma, Coronary Artery Disease, Coronary Artery Disease, Hypertension, Type II Diabetes, Osteoarthritis, Diabetes, Osteoarthritis, Neuropathy] [5:Cataracts, Asthma, Hypertension, Type II Neuropathy] [N/A:N/A] Date Acquired: [2:12/30/2014] [5:04/29/2015] [N/A:N/A] Weeks of Treatment: [2:23] [5:7] [N/A:N/A] Wound Status: [2:Open] [5:Open] [N/A:N/A] Clustered Wound: [2:Yes] [5:No] [N/A:N/A] Measurements L x W x D 6.4x5.2x0.7 [5:1.3x1.2x0.1] [N/A:N/A] (cm) Area (cm) : [2:26.138] [5:1.225] [N/A:N/A] Volume (cm) : [2:18.297] [5:0.123] [N/A:N/A] % Reduction in Area: [2:-3597.00%] [5:-551.60%] [N/A:N/A] % Reduction in Volume: -25670.40% [5:-547.40%] [N/A:N/A] Classification: [2:Full Thickness Without Exposed Support Structures] [5:Unclassifiable] [N/A:N/A] HBO Classification: [2:Grade 1] [5:Grade 1] [N/A:N/A] Exudate Amount: [2:Medium] [5:None Present] [N/A:N/A] Exudate Type: [2:Purulent] [5:N/A] [N/A:N/A] Exudate Color: [2:yellow, brown, green]  [5:N/A] [N/A:N/A] Wound Margin: [2:Distinct, outline attached Flat and Intact] [N/A:N/A] Granulation Amount: [2:Medium (34-66%)] [5:None Present (0%)] [N/A:N/A] Granulation Quality: [5:N/A] [N/A:N/A] Red, Pink, Hyper- granulation Necrotic Amount: Medium (34-66%) Large (67-100%) N/A Necrotic Tissue: Adherent Slough Eschar N/A Exposed Structures: Fascia: No Fascia: No N/A Fat: No Fat: No Tendon: No Tendon: No Muscle: No Muscle: No Joint: No Joint: No Bone: No Bone: No Limited to Skin Limited to Skin Breakdown Breakdown Epithelialization: Small (1-33%) None N/A Periwound Skin Texture: Scarring: Yes Edema: No N/A Edema: No Excoriation: No Excoriation: No Induration: No Induration: No Callus: No Callus: No Crepitus: No Crepitus: No Fluctuance: No Fluctuance: No Friable: No Friable: No Rash: No Rash: No Scarring: No Periwound Skin Moist: Yes Dry/Scaly: Yes N/A Moisture: Maceration: No Maceration: No Dry/Scaly: No Moist: No Periwound Skin Color: Hemosiderin Staining: Yes Hemosiderin Staining: Yes N/A Atrophie Blanche: No Atrophie Blanche: No Cyanosis: No Cyanosis: No Ecchymosis: No Ecchymosis: No Erythema: No Erythema: No Mottled: No Mottled: No Pallor: No Pallor: No Rubor: No Rubor: No Temperature: No Abnormality No Abnormality N/A Tenderness on Yes Yes N/A Palpation: Wound Preparation: Ulcer Cleansing: Ulcer Cleansing: N/A Rinsed/Irrigated with Rinsed/Irrigated with Saline Saline Topical Anesthetic Topical Anesthetic Applied: Other: lidocaine Applied: None 4% Treatment Notes Electronic Signature(s) Signed: 07/14/2015 4:49:24 PM By: [name], RN, [name], [name], [name] Entered By: [name], RN, [name], Kim on 07/14/2015 13:32:37 Wanner, [name] ([id number]) [name], [name] ([id number]) -------------------------------------------------------------------------------- Multi-Disciplinary Care Plan Details Patient Name: [name], FOOS. Date of Service: 07/14/2015  1:00 PM Medical Record Number: [id number] Patient Account Number: [id number] Date of Birth/Sex: 10/25/1946 (68 y.o. Female) Treating RN: [name] Primary Care Physician: Glendon Axe Other Clinician: Referring Physician: Glendon Axe Treating Physician/Extender: Frann Rider in Treatment: 75 Active Inactive Orientation to the Wound Care Program Nursing Diagnoses: Knowledge deficit related to the wound healing center program Goals: Patient/caregiver will verbalize understanding of the [facility] Date Initiated: 01/31/2015 Goal Status: Active Interventions: Provide education on orientation to the wound center Notes: Venous Leg Ulcer Nursing Diagnoses: Potential for venous Insuffiency (use before diagnosis confirmed) Goals: Non-invasive venous studies are completed as ordered Date Initiated: 01/31/2015 Goal Status: Active Patient/caregiver will verbalize understanding of disease process and disease management Date Initiated: 01/31/2015 Goal Status: Active Interventions: Assess peripheral edema status every visit. Notes: Wound/Skin Impairment Nursing Diagnoses: Impaired tissue integrity Knowledge deficit related to smoking impact on wound healing [name], [name] ([id number]) Goals: Patient/caregiver will verbalize understanding of skin care regimen Date Initiated: 01/31/2015 Goal Status: Active Ulcer/skin breakdown will heal within 14 weeks Date Initiated: 01/31/2015 Goal Status: Active Interventions: Assess ulceration(s) every visit Notes: Electronic Signature(s) Signed: 07/14/2015 4:49:24 PM By: [name], RN, [name], [name], [name] Entered By: [name], RN, [name], Kim on 07/14/2015 13:32:30 [name], [name] ([id number]) -------------------------------------------------------------------------------- Pain Assessment Details Patient Name: [name], [name] Date of Service:  07/14/2015 1:00 PM Medical Record Number: [id number] Patient Account Number: [id number] Date  of Birth/Sex: [birth year] (68 y.o. Female) Treating RN: Junious Dresser Primary Care Physician: Glendon Axe Other Clinician: Referring Physician: Glendon Axe Treating Physician/Extender: Frann Rider in Treatment: 23 Active Problems Location of Pain Severity and Description of Pain Patient Has Paino Yes Site Locations Pain Location: Pain in Ulcers With Dressing Change: Yes Duration of the Pain. Constant / Intermittento Intermittent Rate the pain. Current Pain Level: 3 Worst Pain Level: 6 Least Pain Level: 0 Character of Pain Describe the Pain: Burning, Other: "stinging" Pain Management and Medication Current Pain Management: Electronic Signature(s) Signed: 07/28/2015 4:32:11 PM By: Junious Dresser RN Entered By: Junious Dresser on 07/14/2015 13:17:10 [name], [name] ([id number]) -------------------------------------------------------------------------------- Patient/Caregiver Education Details Patient Name: [name] D. Date of Service: 07/14/2015 1:00 PM Medical Record Number: [id number] Patient Account Number: [id number] Date of Birth/Gender: 05-Aug-1946 (68 y.o. Female) Treating RN: Junious Dresser Primary Care Physician: Glendon Axe Other Clinician: Referring Physician: Glendon Axe Treating Physician/Extender: Frann Rider in Treatment: 52 Education Assessment Education Provided To: Patient Education Topics Provided Safety: Methods: Explain/Verbal Responses: State content correctly Wound Debridement: Methods: Explain/Verbal Responses: State content correctly Wound/Skin Impairment: Methods: Explain/Verbal Responses: State content correctly Electronic Signature(s) Signed: 07/28/2015 4:32:11 PM By: Junious Dresser RN Entered By: Junious Dresser on 07/14/2015 16:24:44 [name], [name] ([id number]) -------------------------------------------------------------------------------- Wound Assessment Details Patient Name: [name] D. Date of Service:  07/14/2015 1:00 PM Medical Record Number: [id number] Patient Account Number: [id number] Date of Birth/Sex: 31-Jul-1946 (68 y.o. Female) Treating RN: Junious Dresser Primary Care Physician: Glendon Axe Other Clinician: Referring Physician: Glendon Axe Treating Physician/Extender: Frann Rider in Treatment: 23 Wound Status Wound Number: 2 Primary Venous Leg Ulcer Etiology: Wound Location: Left Lower Leg - Distal Wound Open Wounding Event: Blister Status: Date Acquired: 12/30/2014 Comorbid Cataracts, Asthma, Coronary Artery Weeks Of Treatment: 23 History: Disease, Hypertension, Type II Clustered Wound: Yes Diabetes, Osteoarthritis, Neuropathy Photos Photo Uploaded By: [name], RN, [name], Kim on 07/14/2015 15:10:40 Wound Measurements Length: (cm) 6.4 % Reduction in Width: (cm) 5.2 % Reduction in Depth: (cm) 0.7 Epithelializati Area: (cm) 26.138 Tunneling: Volume: (cm) 18.297 Undermining: Area: -3597% Volume: -25670.4% on: Small (1-33%) No No Wound Description Full Thickness Without Classification: Exposed Support Structures Diabetic Severity Grade 1 (Wagner): Wound Margin: Distinct, outline attached Exudate Amount: Medium Exudate Type: Purulent Exudate Color: yellow, brown, green Foul Odor After Cleansing: No Wound Bed Granulation Amount: Medium (34-66%) Exposed Structure Depaolo, Carlyon D. ([id number]) Granulation Quality: Red, Pink, Hyper-granulation Fascia Exposed: No Necrotic Amount: Medium (34-66%) Fat Layer Exposed: No Necrotic Quality: Adherent Slough Tendon Exposed: No Muscle Exposed: No Joint Exposed: No Bone Exposed: No Limited to Skin Breakdown Periwound Skin Texture Texture Color No Abnormalities Noted: No No Abnormalities Noted: No Callus: No Atrophie Blanche: No Crepitus: No Cyanosis: No Excoriation: No Ecchymosis: No Fluctuance: No Erythema: No Friable: No Hemosiderin Staining: Yes Induration: No Mottled: No Localized Edema:  No Pallor: No Rash: No Rubor: No Scarring: Yes Temperature / Pain Moisture Temperature: No Abnormality No Abnormalities Noted: No Tenderness on Palpation: Yes Dry / Scaly: No Maceration: No Moist: Yes Wound Preparation Ulcer Cleansing: Rinsed/Irrigated with Saline Topical Anesthetic Applied: Other: lidocaine 4%, Electronic Signature(s) Signed: 07/28/2015 4:32:11 PM By: Junious Dresser RN Entered By: Junious Dresser on 07/14/2015 13:27:33 Lindquist, Illeana D. ([id number]) -------------------------------------------------------------------------------- Wound Assessment Details Patient Name: PRECILLA, PURNELL D. Date of Service: 07/14/2015 1:00 PM Medical Record Number: [id number] Patient Account Number: [id number] Date of Birth/Sex: 05-Feb-1946 (68 y.o. Female)  Treating RN: Junious Dresser Primary Care Physician: Glendon Axe Other Clinician: Referring Physician: Glendon Axe Treating Physician/Extender: Frann Rider in Treatment: 23 Wound Status Wound Number: 5 Primary Venous Leg Ulcer Etiology: Wound Location: Right Lower Leg - Posterior Wound Open Wounding Event: Gradually Appeared Status: Date Acquired: 04/29/2015 Comorbid Cataracts, Asthma, Coronary Artery Weeks Of Treatment: 7 History: Disease, Hypertension, Type II Clustered Wound: No Diabetes, Osteoarthritis, Neuropathy Photos Photo Uploaded By: [name], RN, [name], Kim on 07/14/2015 15:10:52 Wound Measurements Length: (cm) 1.3 Width: (cm) 1.2 Depth: (cm) 0.1 Area: (cm) 1.225 Volume: (cm) 0.123 % Reduction in Area: -551.6% % Reduction in Volume: -547.4% Epithelialization: None Tunneling: No Undermining: No Wound Description Classification: Unclassifiable Diabetic Severity (Wagner): Grade 1 Wound Margin: Flat and Intact Exudate Amount: None Present Foul Odor After Cleansing: No Wound Bed Granulation Amount: None Present (0%) Exposed Structure Necrotic Amount: Large (67-100%) Fascia Exposed: No Necrotic  Quality: Eschar Fat Layer Exposed: No Tendon Exposed: No Muscle Exposed: No Illingworth, Jeananne D. ([id number]) Joint Exposed: No Bone Exposed: No Limited to Skin Breakdown Periwound Skin Texture Texture Color No Abnormalities Noted: No No Abnormalities Noted: No Callus: No Atrophie Blanche: No Crepitus: No Cyanosis: No Excoriation: No Ecchymosis: No Fluctuance: No Erythema: No Friable: No Hemosiderin Staining: Yes Induration: No Mottled: No Localized Edema: No Pallor: No Rash: No Rubor: No Scarring: No Temperature / Pain Moisture Temperature: No Abnormality No Abnormalities Noted: No Tenderness on Palpation: Yes Dry / Scaly: Yes Maceration: No Moist: No Wound Preparation Ulcer Cleansing: Rinsed/Irrigated with Saline Topical Anesthetic Applied: None Electronic Signature(s) Signed: 07/28/2015 4:32:11 PM By: Junious Dresser RN Entered By: Junious Dresser on 07/14/2015 13:28:06 Butkiewicz, Arletha D. ([id number]) -------------------------------------------------------------------------------- Vitals Details Patient Name: [name] D. Date of Service: 07/14/2015 1:00 PM Medical Record Number: [id number] Patient Account Number: [id number] Date of Birth/Sex: [birth year] (68 y.o. Female) Treating RN: Junious Dresser Primary Care Physician: Glendon Axe Other Clinician: Referring Physician: Glendon Axe Treating Physician/Extender: Frann Rider in Treatment: 23 Vital Signs Time Taken: 13:17 Temperature (F): 97.7 Height (in): 65 Pulse (bpm): 73 Weight (lbs): 248 Respiratory Rate (breaths/min): 20 Body Mass Index (BMI): 41.3 Blood Pressure (mmHg): 139/59 Reference Range: 80 - 120 mg / dl Electronic Signature(s) Signed: 07/28/2015 4:32:11 PM By: Junious Dresser RN Entered By: Junious Dresser on 07/14/2015 13:19:48

## 2015-07-30 NOTE — Progress Notes (Signed)
Kristin, [name] ([id number]) Visit Report for 07/28/2015 Arrival Information Details Patient Name: [name], [name] Date of Service: 07/28/2015 3:30 PM Medical Record Number: [id number] Patient Account Number: [id number] Date of Birth/Sex: [birth year] (69 y.o. Female) Treating RN: Junious Dresser Primary Care Physician: Glendon Axe Other Clinician: Referring Physician: Glendon Axe Treating Physician/Extender: Frann Rider in Treatment: 25 Visit Information History Since Last Visit Added or deleted any medications: No Patient Arrived: Ambulatory Any new allergies or adverse reactions: No Arrival Time: 15:42 Had a fall or experienced change in No Accompanied By: grand daughter activities of daily living that may affect Transfer Assistance: None risk of falls: Patient Identification Verified: Yes Signs or symptoms of abuse/neglect since last No Secondary Verification Process Yes visito Completed: Hospitalized since last visit: No Patient Has Alerts: Yes Has Dressing in Place as Prescribed: Yes Patient Alerts: Patient on Blood Has Compression in Place as Prescribed: Yes Thinner Pain Present Now: Yes Electronic Signature(s) Signed: 07/28/2015 4:32:26 PM By: Junious Dresser RN Entered By: Junious Dresser on 07/28/2015 15:43:17 Gravely, Zakyria D. ([id number]) -------------------------------------------------------------------------------- Clinic Level of Care Assessment Details Patient Name: Kristin Stare D. Date of Service: 07/28/2015 3:30 PM Medical Record Number: [id number] Patient Account Number: [id number] Date of Birth/Sex: 1946/01/12 (69 y.o. Female) Treating RN: [name] Primary Care Physician: Glendon Axe Other Clinician: Referring Physician: Glendon Axe Treating Physician/Extender: Frann Rider in Treatment: 25 Clinic Level of Care Assessment Items TOOL 4 Quantity Score []  - Use when only an EandM is performed on FOLLOW-UP visit 0 ASSESSMENTS - Nursing  Assessment / Reassessment []  - Reassessment of Co-morbidities (includes updates in patient status) 0 X - Reassessment of Adherence to Treatment Plan 1 5 ASSESSMENTS - Wound and Skin Assessment / Reassessment X - Simple Wound Assessment / Reassessment - one wound 1 5 []  - Complex Wound Assessment / Reassessment - multiple wounds 0 []  - Dermatologic / Skin Assessment (not related to wound area) 0 ASSESSMENTS - Focused Assessment []  - Circumferential Edema Measurements - multi extremities 0 []  - Nutritional Assessment / Counseling / Intervention 0 []  - Lower Extremity Assessment (monofilament, tuning fork, pulses) 0 []  - Peripheral Arterial Disease Assessment (using hand held doppler) 0 ASSESSMENTS - Ostomy and/or Continence Assessment and Care []  - Incontinence Assessment and Management 0 []  - Ostomy Care Assessment and Management (repouching, etc.) 0 PROCESS - Coordination of Care X - Simple Patient / Family Education for ongoing care 1 15 []  - Complex (extensive) Patient / Family Education for ongoing care 0 []  - Staff obtains Programmer, systems, Records, Test Results / Process Orders 0 []  - Staff telephones HHA, Nursing Homes / Clarify orders / etc 0 []  - Routine Transfer to another Facility (non-emergent condition) 0 [name], [name] ([id number]) []  - Routine Hospital Admission (non-emergent condition) 0 []  - New Admissions / Biomedical engineer / Ordering NPWT, Apligraf, etc. 0 []  - Emergency Hospital Admission (emergent condition) 0 X - Simple Discharge Coordination 1 10 []  - Complex (extensive) Discharge Coordination 0 PROCESS - Special Needs []  - Pediatric / Minor Patient Management 0 []  - Isolation Patient Management 0 []  - Hearing / Language / Visual special needs 0 []  - Assessment of Community assistance (transportation, D/C planning, etc.) 0 []  - Additional assistance / Altered mentation 0 []  - Support Surface(s) Assessment (bed, cushion, seat, etc.) 0 INTERVENTIONS - Wound  Cleansing / Measurement []  - Simple Wound Cleansing - one wound 0 X - Complex Wound Cleansing - multiple wounds 2 5 X - Wound Imaging (photographs -  any number of wounds) 1 5 []  - Wound Tracing (instead of photographs) 0 []  - Simple Wound Measurement - one wound 0 X - Complex Wound Measurement - multiple wounds 2 5 INTERVENTIONS - Wound Dressings X - Small Wound Dressing one or multiple wounds 1 10 X - Medium Wound Dressing one or multiple wounds 1 15 []  - Large Wound Dressing one or multiple wounds 0 []  - Application of Medications - topical 0 []  - Application of Medications - injection 0 INTERVENTIONS - Miscellaneous []  - External ear exam 0 [name], [name] ([id number]) []  - Specimen Collection (cultures, biopsies, blood, body fluids, etc.) 0 []  - Specimen(s) / Culture(s) sent or taken to Lab for analysis 0 []  - Patient Transfer (multiple staff / [name]) 0 []  - Simple Staple / Suture removal (25 or less) 0 []  - Complex Staple / Suture removal (26 or more) 0 []  - Hypo / Hyperglycemic Management (close monitor of Blood Glucose) 0 []  - Ankle / Brachial Index (ABI) - do not check if billed separately 0 X - Vital Signs 1 5 Has the patient been seen at the hospital within the last three years: Yes Total Score: 90 Level Of Care: New/Established - Level 3 Electronic Signature(s) Signed: 07/29/2015 5:33:23 PM By: [name], RN, [name], [name], [name] Entered By: [name], RN, [name], Kim on 07/28/2015 16:33:51 Graffam, Tia Masker ([id number]) -------------------------------------------------------------------------------- Encounter Discharge Information Details Patient Name: [name], [name] Date of Service: 07/28/2015 3:30 PM Medical Record Number: [id number] Patient Account Number: [id number] Date of Birth/Sex: 1946-08-08 (69 y.o. Female) Treating RN: [name] Primary Care Physician: Glendon Axe Other Clinician: Referring Physician: Glendon Axe Treating Physician/Extender:  Frann Rider in Treatment: 25 Encounter Discharge Information Items Discharge Condition: Stable Ambulatory Status: Ambulatory Discharge Destination: Home Transportation: Private Auto Accompanied By: grand daughter Schedule Follow-up Appointment: Yes Medication Reconciliation completed and provided to Patient/Care No Clara Herbison: Provided on Clinical Summary of Care: 07/28/2015 Form Type Recipient Paper Patient LM Electronic Signature(s) Signed: 07/28/2015 4:32:26 PM By: Junious Dresser RN Previous Signature: 07/28/2015 4:18:41 PM Version By: Ruthine Dose Entered By: Junious Dresser on 07/28/2015 16:26:02 [place], Glennys D. ([id number]) -------------------------------------------------------------------------------- Lower Extremity Assessment Details Patient Name: [name], [name] Date of Service: 07/28/2015 3:30 PM Medical Record Number: [id number] Patient Account Number: [id number] Date of Birth/Sex: [birth year] (69 y.o. Female) Treating RN: Junious Dresser Primary Care Physician: Glendon Axe Other Clinician: Referring Physician: Glendon Axe Treating Physician/Extender: Frann Rider in Treatment: 25 Edema Assessment Assessed: [Left: Yes] [Right: Yes] Edema: [Left: Yes] [Right: Yes] Calf Left: Right: Point of Measurement: 30 cm From Medial Instep 35.5 cm 37.5 cm Ankle Left: Right: Point of Measurement: 9 cm From Medial Instep 25.9 cm 23.6 cm Vascular Assessment Claudication: Claudication Assessment [Left:None] [Right:None] Pulses: Posterior Tibial Dorsalis Pedis Palpable: [Left:Yes] [Right:Yes] Extremity colors, hair growth, and conditions: Extremity Color: [Left:Hyperpigmented] [Right:Hyperpigmented] Temperature of Extremity: [Left:Warm] [Right:Warm] Capillary Refill: [Left:< 3 seconds] [Right:< 3 seconds] Dependent Rubor: [Left:No] [Right:No] Blanched when Elevated: [Left:No] [Right:No] Toe Nail Assessment Left: Right: Thick: No No Discolored: No  No Deformed: No No Improper Length and Hygiene: No No Electronic Signature(s) Signed: 07/28/2015 4:32:26 PM By: Junious Dresser RN Rigdon, Lashone D. ([id number]) Entered By: Junious Dresser on 07/28/2015 15:50:22 Campanelli, Sarye D. ([id number]) -------------------------------------------------------------------------------- Multi Wound Chart Details Patient Name: [name], [name] Date of Service: 07/28/2015 3:30 PM Medical Record Number: [id number] Patient Account Number: [id number] Date of Birth/Sex: 01/18/1946 (69 y.o. Female) Treating RN: [name] Primary Care Physician: [facility], Delana Meyer  Other Clinician: Referring Physician: Glendon Axe Treating Physician/Extender: Frann Rider in Treatment: 25 Vital Signs Height(in): 65 Pulse(bpm): 76 Weight(lbs): 248 Blood Pressure 135/63 (mmHg): Body Mass Index(BMI): 41 Temperature(F): 97.7 Respiratory Rate 20 (breaths/min): Photos: [2:No Photos] [5:No Photos] [N/A:N/A] Wound Location: [2:Left Lower Leg - Distal Right Lower Leg -] [5:Posterior] [N/A:N/A] Wounding Event: [2:Blister] [5:Gradually Appeared] [N/A:N/A] Primary Etiology: [2:Venous Leg Ulcer] [5:Venous Leg Ulcer] [N/A:N/A] Comorbid History: [2:Cataracts, Asthma, Coronary Artery Disease, Coronary Artery Disease, Hypertension, Type II Diabetes, Osteoarthritis, Diabetes, Osteoarthritis, Neuropathy] [5:Cataracts, Asthma, Hypertension, Type II Neuropathy] [N/A:N/A] Date Acquired: [2:12/30/2014] [5:04/29/2015] [N/A:N/A] Weeks of Treatment: [2:25] [5:9] [N/A:N/A] Wound Status: [2:Open] [5:Open] [N/A:N/A] Clustered Wound: [2:Yes] [5:No] [N/A:N/A] Measurements L x W x D 7.4x5.3x0.5 [5:1.6x2.1x0.1] [N/A:N/A] (cm) Area (cm) : [2:30.803] [5:2.639] [N/A:N/A] Volume (cm) : [2:15.402] [5:0.264] [N/A:N/A] % Reduction in Area: [2:-4256.90%] [5:-1303.70%] [N/A:N/A] % Reduction in Volume: -21593.00% [5:-1289.50%] [N/A:N/A] Classification: [2:Full Thickness Without Exposed Support Structures]  [5:Unclassifiable] [N/A:N/A] HBO Classification: [2:Grade 1] [5:Grade 1] [N/A:N/A] Exudate Amount: [2:Medium] [5:None Present] [N/A:N/A] Exudate Type: [2:Purulent] [5:N/A] [N/A:N/A] Exudate Color: [2:yellow, brown, green] [5:N/A] [N/A:N/A] Wound Margin: [2:Distinct, outline attached Distinct, outline attached] [N/A:N/A] Granulation Amount: [2:Small (1-33%)] [5:None Present (0%)] [N/A:N/A] Granulation Quality: [5:N/A] [N/A:N/A] Red, Pink, Hyper- granulation Necrotic Amount: Large (67-100%) Large (67-100%) N/A Necrotic Tissue: Adherent Slough Eschar N/A Exposed Structures: Fascia: No Fascia: No N/A Fat: No Fat: No Tendon: No Tendon: No Muscle: No Muscle: No Joint: No Joint: No Bone: No Bone: No Limited to Skin Limited to Skin Breakdown Breakdown Epithelialization: Small (1-33%) None N/A Periwound Skin Texture: Scarring: Yes Edema: No N/A Edema: No Excoriation: No Excoriation: No Induration: No Induration: No Callus: No Callus: No Crepitus: No Crepitus: No Fluctuance: No Fluctuance: No Friable: No Friable: No Rash: No Rash: No Scarring: No Periwound Skin Moist: Yes Dry/Scaly: Yes N/A Moisture: Maceration: No Maceration: No Dry/Scaly: No Moist: No Periwound Skin Color: Hemosiderin Staining: Yes Hemosiderin Staining: Yes N/A Atrophie Blanche: No Atrophie Blanche: No Cyanosis: No Cyanosis: No Ecchymosis: No Ecchymosis: No Erythema: No Erythema: No Mottled: No Mottled: No Pallor: No Pallor: No Rubor: No Rubor: No Temperature: No Abnormality No Abnormality N/A Tenderness on Yes Yes N/A Palpation: Wound Preparation: Ulcer Cleansing: Ulcer Cleansing: N/A Rinsed/Irrigated with Rinsed/Irrigated with Saline Saline Topical Anesthetic Topical Anesthetic Applied: Other: lidocaine Applied: Other: Lidocaine 4% 4% Ointment Treatment Notes Electronic Signature(s) Signed: 07/29/2015 5:33:23 PM By: [name], RN, [name], [name], [name] Entered By: [name], RN, [name], Kim  on 07/28/2015 16:01:42 Mazariego, KIANDRIA CLUM ([id number]) TAIMA, RADA ([id number]) -------------------------------------------------------------------------------- Multi-Disciplinary Care Plan Details Patient Name: [name], [name] Date of Service: 07/28/2015 3:30 PM Medical Record Number: [id number] Patient Account Number: [id number] Date of Birth/Sex: [birth year] (69 y.o. Female) Treating RN: [name] Primary Care Physician: Glendon Axe Other Clinician: Referring Physician: Glendon Axe Treating Physician/Extender: Frann Rider in Treatment: 25 Active Inactive Orientation to the Wound Care Program Nursing Diagnoses: Knowledge deficit related to the wound healing center program Goals: Patient/caregiver will verbalize understanding of the [facility] Date Initiated: 01/31/2015 Goal Status: Active Interventions: Provide education on orientation to the wound center Notes: Venous Leg Ulcer Nursing Diagnoses: Potential for venous Insuffiency (use before diagnosis confirmed) Goals: Non-invasive venous studies are completed as ordered Date Initiated: 01/31/2015 Goal Status: Active Patient/caregiver will verbalize understanding of disease process and disease management Date Initiated: 01/31/2015 Goal Status: Active Interventions: Assess peripheral edema status every visit. Notes: Wound/Skin Impairment Nursing Diagnoses: Impaired tissue integrity Knowledge deficit related to smoking impact on wound healing Dabbs,  MASAYO FERA ([id number]) Goals: Patient/caregiver will verbalize understanding of skin care regimen Date Initiated: 01/31/2015 Goal Status: Active Ulcer/skin breakdown will heal within 14 weeks Date Initiated: 01/31/2015 Goal Status: Active Interventions: Assess ulceration(s) every visit Notes: Electronic Signature(s) Signed: 07/29/2015 5:33:23 PM By: [name], RN, [name], [name], [name] Entered By: [name], RN, [name], Kim on 07/28/2015 16:01:29 Frimpong, Lylian D.  ([id number]) -------------------------------------------------------------------------------- Pain Assessment Details Patient Name: [name], [name] Date of Service: 07/28/2015 3:30 PM Medical Record Number: [id number] Patient Account Number: [id number] Date of Birth/Sex: [birth year] (69 y.o. Female) Treating RN: Junious Dresser Primary Care Physician: Glendon Axe Other Clinician: Referring Physician: Glendon Axe Treating Physician/Extender: Frann Rider in Treatment: 25 Active Problems Location of Pain Severity and Description of Pain Patient Has Paino Yes Site Locations With Dressing Change: Yes Duration of the Pain. Constant / Intermittento Intermittent Rate the pain. Current Pain Level: 1 Worst Pain Level: 5 Least Pain Level: 0 Character of Pain Describe the Pain: Other: "stinging" Pain Management and Medication Current Pain Management: Electronic Signature(s) Signed: 07/28/2015 4:32:26 PM By: Junious Dresser RN Entered By: Junious Dresser on 07/28/2015 15:43:53 Finlay, Ayrianna D. ([id number]) -------------------------------------------------------------------------------- Patient/Caregiver Education Details Patient Name: Kristin Stare D. Date of Service: 07/28/2015 3:30 PM Medical Record Number: [id number] Patient Account Number: [id number] Date of Birth/Gender: 1946/06/15 (69 y.o. Female) Treating RN: Junious Dresser Primary Care Physician: Glendon Axe Other Clinician: Referring Physician: Glendon Axe Treating Physician/Extender: Frann Rider in Treatment: 25 Education Assessment Education Provided To: Patient Education Topics Provided Venous: Methods: Explain/Verbal Responses: State content correctly Wound Debridement: Methods: Explain/Verbal Responses: State content correctly Wound/Skin Impairment: Methods: Explain/Verbal Responses: State content correctly Electronic Signature(s) Signed: 07/28/2015 4:32:26 PM By: Junious Dresser RN Entered By:  Junious Dresser on 07/28/2015 16:26:18 Narasimhan, Barbaraann D. ([id number]) -------------------------------------------------------------------------------- Wound Assessment Details Patient Name: Mittag, [name] Date of Service: 07/28/2015 3:30 PM Medical Record Number: [id number] Patient Account Number: [id number] Date of Birth/Sex: [birth year] (69 y.o. Female) Treating RN: Junious Dresser Primary Care Physician: Glendon Axe Other Clinician: Referring Physician: Glendon Axe Treating Physician/Extender: Frann Rider in Treatment: 25 Wound Status Wound Number: 2 Primary Venous Leg Ulcer Etiology: Wound Location: Left Lower Leg - Distal Wound Open Wounding Event: Blister Status: Date Acquired: 12/30/2014 Comorbid Cataracts, Asthma, Coronary Artery Weeks Of Treatment: 25 History: Disease, Hypertension, Type II Clustered Wound: Yes Diabetes, Osteoarthritis, Neuropathy Photos Photo Uploaded By: Junious Dresser on 07/28/2015 16:42:15 Wound Measurements Length: (cm) 7.4 % Reduction in Width: (cm) 5.3 % Reduction in Depth: (cm) 0.5 Epithelializati Area: (cm) 30.803 Tunneling: Volume: (cm) 15.402 Undermining: Area: -4256.9% Volume: -21593% on: Small (1-33%) No No Wound Description Full Thickness Without Classification: Exposed Support Structures Diabetic Severity Grade 1 (Wagner): TALAJAH, SLIMP ([id number]) Foul Odor After Cleansing: No Wound Margin: Distinct, outline attached Exudate Amount: Medium Exudate Type: Purulent Exudate Color: yellow, brown, green Wound Bed Granulation Amount: Small (1-33%) Exposed Structure Granulation Quality: Red, Pink, Hyper-granulation Fascia Exposed: No Necrotic Amount: Large (67-100%) Fat Layer Exposed: No Necrotic Quality: Adherent Slough Tendon Exposed: No Muscle Exposed: No Joint Exposed: No Bone Exposed: No Limited to Skin Breakdown Periwound Skin Texture Texture Color No Abnormalities Noted: No No Abnormalities Noted:  No Callus: No Atrophie Blanche: No Crepitus: No Cyanosis: No Excoriation: No Ecchymosis: No Fluctuance: No Erythema: No Friable: No Hemosiderin Staining: Yes Induration: No Mottled: No Localized Edema: No Pallor: No Rash: No Rubor: No Scarring: Yes Temperature / Pain Moisture Temperature: No Abnormality No Abnormalities Noted: No Tenderness on Palpation: Yes Dry / Scaly: No Maceration:  No Moist: Yes Wound Preparation Ulcer Cleansing: Rinsed/Irrigated with Saline Topical Anesthetic Applied: Other: lidocaine 4%, Treatment Notes Wound #2 (Left, Distal Lower Leg) 1. Cleansed with: Clean wound with Normal Saline 2. Anesthetic Topical Lidocaine 4% cream to wound bed prior to debridement 4. Dressing Applied: Iodoform packing Gauze Other dressing (specify in notes) Gartrell, Marixa D. ([id number]) 7. Secured with 2 Layer Compression System - Left Lower Extremity Notes iodoform packing strip to proximal satellite; sorbact bordered foam to main wound Electronic Signature(s) Signed: 07/28/2015 4:32:26 PM By: Junious Dresser RN Entered By: Junious Dresser on 07/28/2015 15:57:10 Ouzts, Dinia D. ([id number]) -------------------------------------------------------------------------------- Wound Assessment Details Patient Name: Bangs, [name] Date of Service: 07/28/2015 3:30 PM Medical Record Number: [id number] Patient Account Number: [id number] Date of Birth/Sex: 06/10/1946 (69 y.o. Female) Treating RN: Junious Dresser Primary Care Physician: Glendon Axe Other Clinician: Referring Physician: Glendon Axe Treating Physician/Extender: Frann Rider in Treatment: 25 Wound Status Wound Number: 5 Primary Venous Leg Ulcer Etiology: Wound Location: Right Lower Leg - Posterior Wound Open Wounding Event: Gradually Appeared Status: Date Acquired: 04/29/2015 Comorbid Cataracts, Asthma, Coronary Artery Weeks Of Treatment: 9 History: Disease, Hypertension, Type II Clustered Wound:  No Diabetes, Osteoarthritis, Neuropathy Photos Photo Uploaded By: Junious Dresser on 07/28/2015 16:42:15 Wound Measurements Length: (cm) 1.6 Width: (cm) 2.1 Depth: (cm) 0.1 Area: (cm) 2.639 Volume: (cm) 0.264 % Reduction in Area: -1303.7% % Reduction in Volume: -1289.5% Epithelialization: None Tunneling: No Undermining: No Wound Description Classification: Unclassifiable Diabetic Severity Earleen Newport): Grade 1 Wound Margin: Distinct, outline attach Exudate Amount: None Present Foul Odor After Cleansing: No ed Wound Bed Granulation Amount: None Present (0%) Exposed Structure Necrotic Amount: Large (67-100%) Fascia Exposed: No Necrotic Quality: Eschar Fat Layer Exposed: No Tendon Exposed: No Muscle Exposed: No [place], [place] D. ([id number]) Joint Exposed: No Bone Exposed: No Limited to Skin Breakdown Periwound Skin Texture Texture Color No Abnormalities Noted: No No Abnormalities Noted: No Callus: No Atrophie Blanche: No Crepitus: No Cyanosis: No Excoriation: No Ecchymosis: No Fluctuance: No Erythema: No Friable: No Hemosiderin Staining: Yes Induration: No Mottled: No Localized Edema: No Pallor: No Rash: No Rubor: No Scarring: No Temperature / Pain Moisture Temperature: No Abnormality No Abnormalities Noted: No Tenderness on Palpation: Yes Dry / Scaly: Yes Maceration: No Moist: No Wound Preparation Ulcer Cleansing: Rinsed/Irrigated with Saline Topical Anesthetic Applied: Other: Lidocaine 4% Ointment, Treatment Notes Wound #5 (Right, Posterior Lower Leg) 1. Cleansed with: Clean wound with Normal Saline 2. Anesthetic Topical Lidocaine 4% cream to wound bed prior to debridement 4. Dressing Applied: Other dressing (specify in notes) 5. Secondary Dressing Applied Bordered Foam Dressing Notes betadine paint to the right posterior; pt refused application of compression garment to (R)LL Electronic Signature(s) Signed: 07/28/2015 4:32:26 PM By: Junious Dresser RN Entered By: Junious Dresser on 07/28/2015 15:57:41 Rickenbach, Semaja D. ([id number]) -------------------------------------------------------------------------------- Vitals Details Patient Name: Kristin Stare D. Date of Service: 07/28/2015 3:30 PM Medical Record Number: [id number] Patient Account Number: [id number] Date of Birth/Sex: 27-Dec-1945 (69 y.o. Female) Treating RN: Junious Dresser Primary Care Physician: Glendon Axe Other Clinician: Referring Physician: Glendon Axe Treating Physician/Extender: Frann Rider in Treatment: 25 Vital Signs Time Taken: 15:43 Temperature (F): 97.7 Height (in): 65 Pulse (bpm): 76 Weight (lbs): 248 Respiratory Rate (breaths/min): 20 Body Mass Index (BMI): 41.3 Blood Pressure (mmHg): 135/63 Reference Range: 80 - 120 mg / dl Electronic Signature(s) Signed: 07/28/2015 4:32:26 PM By: Junious Dresser RN Entered By: Junious Dresser on 07/28/2015 15:44:11

## 2015-08-04 ENCOUNTER — Encounter (HOSPITAL_BASED_OUTPATIENT_CLINIC_OR_DEPARTMENT_OTHER): Payer: PPO | Admitting: General Surgery

## 2015-08-04 DIAGNOSIS — I83022 Varicose veins of left lower extremity with ulcer of calf: Secondary | ICD-10-CM | POA: Insufficient documentation

## 2015-08-04 DIAGNOSIS — I83012 Varicose veins of right lower extremity with ulcer of calf: Secondary | ICD-10-CM | POA: Diagnosis not present

## 2015-08-04 DIAGNOSIS — L97322 Non-pressure chronic ulcer of left ankle with fat layer exposed: Secondary | ICD-10-CM | POA: Diagnosis not present

## 2015-08-04 DIAGNOSIS — L97229 Non-pressure chronic ulcer of left calf with unspecified severity: Secondary | ICD-10-CM

## 2015-08-04 DIAGNOSIS — L97219 Non-pressure chronic ulcer of right calf with unspecified severity: Principal | ICD-10-CM

## 2015-08-04 NOTE — Progress Notes (Signed)
seeiheal 

## 2015-08-05 NOTE — Progress Notes (Addendum)
Kristin, [name] ([id number]) Visit Report for 08/04/2015 Chief Complaint Document Details Patient Name: [name], [name] 08/04/2015 1:45 Date of Service: PM Medical Record [id number] Number: Patient Account Number: [id number] August 10, 1946 (69 y.o. Treating RN: [name] Date of Birth/Sex: Female) Other Clinician: Primary Care Physician: [facility], Hyman Hopes, Bernabe Dorce Referring Physician: Glendon Axe Physician/Extender: Weeks in Treatment: 26 Information Obtained from: Patient Chief Complaint Patient presents to the wound care center for a consult due non healing wound 69 year old patient comes with a history of having a ulcer on the left lower extremity for the past 4 weeks. she says she's had swelling of both lower extremities for about a year after she started having prednisone. 02/07/2015 -- her vascular appointments obtained were in the first and third week of June. she is able to go to [place] and we will try and get her some earlier appointments. Other than that nothing else has changed in her management. Electronic Signature(s) Signed: 08/04/2015 5:36:04 PM By: Judene Companion MD Entered By: Judene Companion on 08/04/2015 17:06:11 [place]heim, WESTYN KEATLEY ([id number]) -------------------------------------------------------------------------------- Debridement Details Patient Name: Kristin, [name] 08/04/2015 1:45 Date of Service: PM Medical Record [id number] Number: Patient Account Number: [id number] April 27, 1946 (68 y.o. Treating RN: [name] Date of Birth/Sex: Female) Other Clinician: Primary Care Physician: Danny Lawless, Virgil Referring Physician: [place], [place]: Weeks in Treatment: 26 Debridement Performed for Wound #2 Left,Distal Lower Leg Assessment: Performed By: Physician Judene Companion, MD Debridement: Debridement Pre-procedure Yes Verification/Time Out Taken: Start Time: 14:28 Pain Control: Lidocaine 4% Topical  Solution Level: Skin/Subcutaneous Tissue Total Area Debrided (L x 7.4 (cm) x 5.5 (cm) = 40.7 (cm) W): Tissue and other Viable, Non-Viable, Fibrin/Slough, Subcutaneous material debrided: Instrument: Curette Bleeding: Minimum Hemostasis Achieved: Pressure End Time: 14:32 Procedural Pain: 5 Post Procedural Pain: 0 Response to Treatment: Procedure was tolerated well Post Debridement Measurements of Total Wound Length: (cm) 7.4 Width: (cm) 5.5 Depth: (cm) 0.5 Volume: (cm) 15.983 Post Procedure Diagnosis Same as Pre-procedure Electronic Signature(s) Signed: 08/04/2015 5:24:25 PM By: [name] Entered By: [name] on 08/04/2015 14:33:56 Smock, Lyanna DMarland Kitchen ([id number]) -------------------------------------------------------------------------------- Debridement Details Patient Name: Kristin Stare D. 08/04/2015 1:45 Date of Service: PM Medical Record [id number] Number: Patient Account Number: [id number] 05/26/1946 (69 y.o. Treating RN: [name] Date of Birth/Sex: Female) Other Clinician: Primary Care Physician: Danny Lawless, [place] Referring Physician: [place], [place]: Weeks in Treatment: 26 Debridement Performed for Wound #5 Right,Posterior Lower Leg Assessment: Performed By: Physician Judene Companion, MD Debridement: Debridement Pre-procedure Yes Verification/Time Out Taken: Start Time: 14:33 Pain Control: Lidocaine 4% Topical Solution Level: Skin/Subcutaneous Tissue Total Area Debrided (L x 1.8 (cm) x 2.3 (cm) = 4.14 (cm) W): Tissue and other Viable, Non-Viable, Eschar material debrided: Instrument: Blade, Forceps, Scissors Bleeding: Minimum Hemostasis Achieved: Pressure End Time: 14:36 Procedural Pain: 5 Post Procedural Pain: 0 Response to Treatment: Procedure was tolerated well Post Debridement Measurements of Total Wound Length: (cm) 1.8 Width: (cm) 2.3 Depth: (cm) 0.2 Volume: (cm) 0.65 Post Procedure  Diagnosis Same as Pre-procedure Electronic Signature(s) Signed: 08/04/2015 5:24:25 PM By: [name] Entered By: [name] on 08/04/2015 14:36:29 Gerber, Wania D. ([id number]) -------------------------------------------------------------------------------- HPI Details Patient Name: Kristin, [name] 08/04/2015 1:45 Date of Service: PM Medical Record [id number] Number: Patient Account Number: [id number] 07-Apr-1946 (69 y.o. Treating RN: [name] Date of Birth/Sex: Female) Other Clinician: Primary Care Physician: [facility], Hyman Hopes, Marceline Napierala Referring Physician: Glendon Axe Physician/Extender: Weeks in Treatment: 26 History of Present Illness HPI Description: 69 year old patient who  is known to have diabetes mellitus type 2, chronic renal insufficiency, coronary artery disease, hypertension, hypercholesterolemia, temporal arteritis and inflammatory arthritiss also has a history of having a hysterectomy and some orthopedic related surgeries. The ulcer on the left lower extremity started off as a blister and then. Got progressively worse. She does not have any fever or chills and has not had any recent surgical intervention for this. Her last hemoglobin A1c was 10.1 in September 2015. She has been recently put on doxycycline by her PCP. She is now also allergic to doxycycline and was this was changed over to Keflex. 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 She does see a cardiologist and also takes a diuretic. 02/07/2015 her arterial and venous duplex studies to be done have dates been given as the first and third week of June. This is at [facility]. We are going to try and get early appointments at [facility]. other than that nothing has changed in her management. 02/14/2015 -- we have been able to get her an appointment in [facility] on May 20  which is much earlier than her previous ones at [facility]. She continues with her prednisone and her sugars are in the range of 150-200. 02/21/2015 We were able to get a vascular lab workup for her today and she is going to be there at 2:00 this afternoon. the swelling of her leg has gone down significantly but she still has some tenderness over the wounds. 02/28/2015 - She has had one of two vascular workups done, and this coming Tuesday has another, at [place] [facility]. She continues to be on steroid medications. She has significant sensitivity in her left lower extremity and has pain suggestive of neuropathic pain and I have asked her to address this with her primary care physician. 03/07/2015 -- The patient saw Dr. Lucky Cowboy for a consultation and he has had her arteries are okay but she has 2 incompetent veins on the left lower extremity and he is going to set her up for surgery. Official report is awaited. Addendum: Official reports are now available and on 03/04/2015. She was seen and lower extremity venous duplex exam was done. There was reflux present within the left greater saphenous vein below the knee and also the left small saphenous vein. Arterial duplex showed normal triphasic waveforms throughout the left lower extremity without any significant stenosis. Her ABIs were noncompressible bilaterally but a waveforms were normal and a digital pressures were normal bilaterally consistent with no significant arterial insufficiency. He has recommended endovenous ablation of both the left small saphenous and the left great saphenous vein. This would still be scheduled later. 03/14/2015 -- she has heard back from the vascular office and has surgery scheduled for sometime in July. SHANDI, GODFREY ([id number]) Her rheumatologist has decreased her prednisone dosage but she still on it. She has also had cataract surgery in her right eye recently this week. 03/20/2015 - No new  complaints today. Pain improved. No fever or chills. Tolerating 2 layer compression. 04/14/2015 -- she was doing very well today she went off on vacation and now her edema has increased markedly the ulceration is bigger and her diabetes is not under control. 04/21/2015 -- I spoke to her PCP Dr. Candiss Norse and discussed the management which would include being seen by a general surgeon for debridement and taking multiple punch biopsies which would  help in establishing the diagnosis of this is a vasculitis. She is agreeable about this and will set her up for the procedure with Dr. Tamala Julian at [facility]. She was seen by the surgeon Dr. Jamal Collin. 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 She is scheduled to have laser ablation done next week.Pt was sent here for possible biopsy to look for vasculitis. Feel it would be better to wait after laser ablation is completed- the ulcer may heal fully and biopsy may not be necessary 04/29/2015 -- she had the venous ablation done by Dr. Lucky Cowboy last Friday and we do not have any notes yet. She is doing fine otherwise. 05/06/2015 --Review of her recent vascular intervention shows that she was seen by Dr. Lucky Cowboy on 04/29/2015. The follow-up duplex which was done showed that both the great saphenous vein and the small saphenous vein remained patent with reflux consistent with an unsuccessful ablation. He has rescheduled her for another the endovenous ablation to be done in about 4 weekso time. 05/13/2015 -- he was seen by her surgeon Dr. Jamal Collin who asked her to continue with conservative therapy and he would speak to Dr. Lucky Cowboy about her management. Dr. Lucky Cowboy is going to schedule her surgery in the middle of August for a repeat endovenous ablation. Her pus culture from last week has grown : Strang her noted her sensitivity report but due to her multiple allergies I had tried clindamycin and she developed a rash with this too. She has been prescribed and anti-buttocks in the ER and is has it at home and she will let is know what she is going to be taking. 05/20/2015 -- she has developed a small spot on her right lower extremity but besides that it is not a full fledged ulceration. She did not get to see Dr. Lucky Cowboy last week and hopefully she will see him in the near future. 05/27/2015 -she is still awaiting her appointment with Dr.Dew and her vascular procedure is not scheduled until August 19. She will be seeing her PCP tomorrow and I have asked her to convey our discussion so that she is aware that debridement has not been done yet. 06/03/2015 -- was seen by her rheumatologist Dr. 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 He is lowering her prednisone to 12-1/2 mg for 1 month and then 10 mg per the next month. I will again make an attempt to speak to her PCP Dr. Candiss Norse and her surgeon Dr. 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 06/17/2015 -- Dr.Dew did her vascular procedure last week and a follow-up venous ultrasound shows good resolution of the veins as per the patient's history. He is to see her back in 2 weeks. ISHANVI, MCQUITTY ([id number]) 07/07/2015. -- the patient has had a heavy growth of Proteus mirabilis and Enterococcus faecalis. These are sensitive to several drugs but the problem is she has allergies to all of these and hence I would like her to see Dr. Ola Spurr for this. She is also due to see Dr. Lucky Cowboy tomorrow and I will discussed the management with him including debridement under anesthesia and possible  biopsies. 07/14/2015 -- she  has an appointment to see Dr. Ola Spurr tomorrow and did see Dr. Bunnie Domino PA who will discuss my request with him. 07/21/2015 -- saw Dr. Ola Spurr was able to do a test on her and has put her on amoxicillin. She has been tolerating that and has had no problems with allergies to this. 07/28/2015 -- Last Friday I spoke to Dr. Leotis Pain regarding her care and he said that her right leg did not need any surgery and on the left leg was doing pretty good. We did agree that if she undergoes any procedure in the future he would do a couple of punch biopsies of the wound. Electronic Signature(s) Signed: 08/04/2015 5:36:04 PM By: Judene Companion MD Entered By: Judene Companion on 08/04/2015 17:06:56 Bundren, BRAEDEN DOLINSKI ([id number]) -------------------------------------------------------------------------------- Physical Exam Details Patient Name: JYL, CHICO 08/04/2015 1:45 Date of Service: PM Medical Record [id number] Number: Patient Account Number: [id number] Jul 28, 1946 (69 y.o. Treating RN: [name] Date of Birth/Sex: Female) Other Clinician: Primary Care Physician: Danny Lawless, Kiya Eno Referring Physician: Glendon Axe Physician/Extender: Weeks in Treatment: 26 Electronic Signature(s) Signed: 08/04/2015 5:36:04 PM By: Judene Companion MD Entered By: Judene Companion on 08/04/2015 17:07:05 Stirn, KALANI BARAY ([id number]) -------------------------------------------------------------------------------- Physician Orders Details Patient Name: BRANDYE, INTHAVONG D. 08/04/2015 1:45 Date of Service: PM Medical Record [id number] Number: Patient Account Number: [id number] 04-29-1946 (69 y.o. Treating RN: [name] Date of Birth/Sex: Female) Other Clinician: Primary Care Physician: [facility], Hyman Hopes, Jashay Roddy Referring Physician: Glendon Axe Physician/Extender: Weeks in Treatment: 42 Verbal / Phone Orders: Yes Clinician: [name] Read Back and Verified: Yes Diagnosis Coding Wound Cleansing Wound #2 Left,Distal Lower Leg o Clean wound with Normal Saline. Wound #5 Right,Posterior Lower Leg o Clean wound with Normal Saline. Anesthetic Wound #2 Left,Distal Lower Leg o Topical Lidocaine 4% cream applied to wound bed prior to debridement Wound #5 Right,Posterior Lower Leg o Topical Lidocaine 4% cream applied to wound bed prior to debridement Primary Wound Dressing Wound #2 Left,Distal Lower Leg o Santyl Ointment o Iodoform packing Gauze - packed into proximal satellite area Wound #5 Right,Posterior Lower Leg o Santyl Ointment Secondary Dressing Wound #2 Left,Distal Lower Leg o Boardered Foam Dressing Wound #5 Right,Posterior Lower Leg o Boardered Foam Dressing Dressing Change Frequency Wound #2 Left,Distal Lower Leg o Change dressing every other day. - daily if necessary due to drainage Wound #5 Right,Posterior Lower Leg Mahadeo, Lacretia D. ([id number]) o Change dressing every other day. - daily if necessary due to drainage Follow-up Appointments Wound #2 Left,Distal Lower Leg o Return Appointment in 1 week. Wound #5 Right,Posterior Lower Leg o Return Appointment in 1 week. Edema Control Wound #2 Left,Distal Lower Leg o Support Garment 20-30 mm/Hg pressure to: Wound #5 Right,Posterior Lower Leg o Support Garment 20-30 mm/Hg pressure to: Additional Orders / Instructions Wound #2 Left,Distal Lower Leg o Increase protein intake. Wound #5 Right,Posterior Lower Leg o Increase protein intake. Medications-please add to medication list. Wound #2 Left,Distal Lower Leg o P.O. Antibiotics - Complete antibiotics prescribed by Dr. Fransisca Kaufmann Santyl Enzymatic Ointment Wound #5 Right,Posterior Lower Leg o P.O. Antibiotics - Complete antibiotics prescribed by Dr. Ola Spurr o Santyl Enzymatic Ointment Electronic Signature(s) Signed: 08/05/2015 10:00:15 AM By:  Judene Companion MD Previous Signature: 08/04/2015 5:24:25 PM Version By: [name] Entered By: Judene Companion on 08/05/2015 10:00:15 LANEA, VANKIRK ([id number]) -------------------------------------------------------------------------------- Problem List Details Patient Name: JAIAH, WEIGEL D. 08/04/2015 1:45 Date of Service: PM Medical Record [id number] Number: Patient Account Number: [id number] April 18, 1946 (69 y.o. 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 ([id number]) -------------------------------------------------------------------------------- Progress Note Details Patient Name: NEKIA, MAXHAM D. 08/04/2015 1:45 Date of Service: PM Medical Record [id number] Number: Patient Account Number: [id number] 06-Jun-1946 (69 y.o. Treating RN: [name] Date of Birth/Sex: Female) Other Clinician: Primary Care Physician: [facility], Hyman Hopes, Tymir Terral Referring Physician: Glendon Axe Physician/Extender: Weeks in Treatment: 26 Subjective Chief Complaint Information obtained from Patient Patient presents to the wound care center for a consult due non healing wound 69 year old patient comes with a history of having a ulcer on the left lower extremity for the past 4 weeks. she says she's had swelling of both lower extremities for about a year after she started having prednisone. 02/07/2015 -- her vascular appointments obtained were in the first and third week of June. she is able to go to [place] and we will try and get her some earlier appointments. Other than that nothing else has changed in her management. History of Present Illness (HPI) 69 year old patient who is known to have diabetes mellitus type 2, chronic renal insufficiency, coronary artery disease, hypertension, hypercholesterolemia, temporal arteritis and inflammatory arthritiss also has a history of having a hysterectomy and some orthopedic related surgeries. The ulcer on the left lower extremity started off as a blister and then. Got progressively worse. She does not have any fever or chills and has not had any recent surgical intervention for this. Her last hemoglobin A1c was 10.1 in September 2015. She has been recently put on doxycycline by her PCP. She is now also allergic to doxycycline and was this was changed over to Keflex. 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 She does see a cardiologist and also takes a diuretic. 02/07/2015 her arterial and venous duplex studies to be done have dates been given as the first and third week of June. This is at [facility]. We are going to try and get early appointments at [facility]. other than that nothing has changed in her management. 02/14/2015 -- we have been able to get her an appointment in [facility] on May 20 which is much  earlier than her previous ones at [place]. She continues with her prednisone and her sugars are in the range of 150-200. 02/21/2015 We were able to get a vascular lab workup for her today and she is going to be there at 2:00 this afternoon. the swelling of her leg has gone down significantly but she still has some tenderness over the wounds. 02/28/2015 - She has had one of two vascular workups done, and this coming Tuesday has another, at [place] [facility]. She continues to be on steroid medications. She has significant sensitivity in her left lower extremity and has pain suggestive of neuropathic pain and I have asked her to address this with her primary care physician. 03/07/2015 -- The patient saw Dr. Lucky Cowboy for a consultation and he has had her arteries are okay but she has 2 incompetent veins on the left lower extremity and he is going to set her up for surgery. Official report is SALIAH, CRISP ([id number]) awaited. Addendum: Official reports are now available and on 03/04/2015. She was seen and lower extremity venous duplex exam was done. There was reflux present within the left greater saphenous vein below the knee and also the left small saphenous vein. Arterial duplex showed normal triphasic waveforms throughout the left lower extremity without any significant stenosis. Her ABIs were noncompressible bilaterally but a waveforms were normal and a digital pressures were normal bilaterally consistent with no significant arterial insufficiency. He has recommended endovenous ablation of both the left small saphenous and the left great saphenous vein. This would still be scheduled later. 03/14/2015 -- she has heard back from the vascular office and has surgery scheduled for sometime in July. Her rheumatologist has decreased her prednisone dosage but she still on it. She has also had cataract surgery in her right eye recently this week. 03/20/2015 - No new complaints today.  Pain improved. No fever or chills. Tolerating 2 layer compression. 04/14/2015 -- she was doing very well today she went off on vacation and now her edema has increased markedly the ulceration is bigger and her diabetes is not under control. 04/21/2015 -- I spoke to her PCP Dr. Candiss Norse and discussed the management which would include being seen by a general surgeon for debridement and taking multiple punch biopsies which would help in establishing the diagnosis of this is a vasculitis. She is agreeable about this and will set her up for the procedure with Dr. Tamala Julian at [facility]. She was seen by the surgeon Dr. Jamal Collin. 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 She is scheduled to have laser ablation done next week.Pt was sent here for possible biopsy to look for vasculitis. Feel it would be better to wait after laser ablation is completed- the ulcer may heal fully and biopsy may not be necessary 04/29/2015 -- she had the venous ablation done by Dr. Lucky Cowboy last Friday and we do not have any notes yet. She is doing fine otherwise. 05/06/2015 --Review of her recent vascular intervention shows that she was seen by Dr. Lucky Cowboy on 04/29/2015. The follow-up duplex which was done showed that both the great saphenous vein and the small saphenous vein remained patent with reflux consistent with an unsuccessful ablation. He has rescheduled her for another the endovenous ablation to be done in about 4 weeks time. 05/13/2015 -- he was seen by her surgeon Dr. Jamal Collin who asked her to continue with conservative therapy and he would speak to Dr. Lucky Cowboy about her management. Dr. Lucky Cowboy is going to schedule her surgery in the middle of August for a repeat endovenous ablation. Her pus culture from last week has grown : [place] her noted her sensitivity report but due to her multiple allergies I had tried clindamycin and she developed a rash with this too. She has been prescribed and anti-buttocks in the ER and is has it at  home and she will let is know what she is going to be taking. 05/20/2015 -- she has developed a small spot on her right lower extremity but besides that it is not a full fledged ulceration. She did not get to see Dr. Lucky Cowboy last week and hopefully she will see him in the near future. 05/27/2015 -she is still awaiting her appointment with Dr.Dew and her vascular procedure is not scheduled until August 19. She will be seeing her PCP tomorrow and I have asked her to convey our discussion so that she is aware that debridement has not been done yet. MATALYN, NAWAZ ([id number]) 06/03/2015 -- was seen by her rheumatologist Dr. 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 He is lowering her prednisone to 12-1/2 mg for 1 month and then 10 mg per the next month. I will again make an attempt to speak to her PCP Dr. Candiss Norse and her surgeon Dr. 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 06/17/2015 -- Dr.Dew did her vascular procedure last week and a follow-up venous ultrasound shows good resolution of the veins as per the patient's history. He is to see her back in 2 weeks. 07/07/2015. -- the patient has had a heavy growth of Proteus mirabilis and Enterococcus faecalis. These are sensitive to several drugs but the problem is she has allergies to all of these and hence I would like her to see Dr. Ola Spurr for this. She is also due to see Dr. Lucky Cowboy tomorrow and I will discussed the management with him including debridement under anesthesia and possible biopsies. 07/14/2015 -- she has  an appointment to see Dr. Ola Spurr tomorrow and did see Dr. Bunnie Domino PA who will discuss my request with him. 07/21/2015 -- saw Dr. Ola Spurr was able to do a test on her and has put her on amoxicillin. She has been tolerating that and has had no problems with allergies to this. 07/28/2015 -- Last Friday I spoke to Dr. Leotis Pain regarding her care and he said that her right leg did not need any surgery and on the left leg was doing pretty good. We did agree that if she undergoes any procedure in the future he would do a couple of punch biopsies of the wound. Objective Constitutional Vitals Time Taken: 2:10 PM, Height: 65 in, Weight: 248 lbs, BMI: 41.3, Temperature: 98.1 F, Pulse: 77 bpm, Respiratory Rate: 18 breaths/min, Blood Pressure: 138/60 mmHg. Integumentary (Hair, Skin) Wound #2 status is Open. Original cause of wound was Blister. The wound is located on the Left,Distal Lower Leg. The wound measures 7.4cm length x 5.5cm width x 0.5cm depth; 31.966cm^2 area and 15.983cm^3 volume. The wound is limited to skin breakdown. There is no undermining noted, however, there is tunneling at 12:00 with a maximum distance of 1.1cm. There is a medium amount of purulent drainage noted. The wound margin is distinct with the outline attached to the wound base. There is large (67-100%) red, pink granulation within the wound bed. There is a small (1-33%) amount of necrotic tissue within the wound bed including Adherent Slough. The periwound skin appearance exhibited: Scarring, Moist, Hemosiderin Staining. The periwound skin appearance did not exhibit: Callus, Crepitus, Excoriation, Fluctuance, Friable, Induration, Localized Edema, Rash, Dry/Scaly, Maceration, Atrophie Blanche, Cyanosis, Ecchymosis, Mottled, Pallor, Rubor, Erythema. Periwound temperature was noted as No  Abnormality. The periwound has tenderness on palpation. Wound #5 status is Open. Original cause of wound was Gradually Appeared. The wound  is located on the DAIYA, TAMER. ([id number]) Right,Posterior Lower Leg. The wound measures 1.8cm length x 2.3cm width x 0.1cm depth; 3.252cm^2 area and 0.325cm^3 volume. The wound is limited to skin breakdown. There is no tunneling or undermining noted. There is a none present amount of drainage noted. The wound margin is distinct with the outline attached to the wound base. There is no granulation within the wound bed. There is a large (67-100%) amount of necrotic tissue within the wound bed including Eschar. The periwound skin appearance exhibited: Dry/Scaly, Hemosiderin Staining. The periwound skin appearance did not exhibit: Callus, Crepitus, Excoriation, Fluctuance, Friable, Induration, Localized Edema, Rash, Scarring, Maceration, Moist, Atrophie Blanche, Cyanosis, Ecchymosis, Mottled, Pallor, Rubor, Erythema. Periwound temperature was noted as No Abnormality. The periwound has tenderness on palpation. 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 There was a Skin/Subcutaneous Tissue Debridement ([id number]) debridement with total area of 40.7 sq cm performed by Judene Companion, MD. with the following instrument(s): Curette to remove Viable and Non-Viable tissue/material including Fibrin/Slough and Subcutaneous after achieving pain control using Lidocaine 4% Topical Solution. A time out was conducted prior to the start of the procedure. A Minimum amount of  bleeding was controlled with Pressure. The procedure was tolerated well with a pain level of 5 throughout and a pain level of 0 following the procedure. Post Debridement Measurements: 7.4cm length x 5.5cm width x 0.5cm depth; 15.983cm^3 volume. Post procedure Diagnosis Wound #2: Same as Pre-Procedure Wound #5 Wound #5 is a Venous Leg Ulcer located on the Right,Posterior Lower Leg . There was a Skin/Subcutaneous Tissue Debridement ([id number]) debridement with total area of 4.14 sq cm performed by Judene Companion, MD. with the following instrument(s): Blade, Forceps, and Scissors to remove Viable and Non-Viable tissue/material including Eschar after achieving pain control using Lidocaine Zara, Orean D. ([id number]) 4% Topical Solution. A time out was conducted prior to the start of the procedure. A Minimum amount of bleeding was controlled with Pressure. The procedure was tolerated well with a pain level of 5 throughout and a pain level of 0 following the procedure. Post Debridement Measurements: 1.8cm length x 2.3cm width x 0.2cm depth; 0.65cm^3 volume. Post procedure Diagnosis Wound #5: Same as Pre-Procedure Plan Wound Cleansing: Wound #2 Left,Distal Lower Leg: Clean wound with Normal Saline. Wound #5 Right,Posterior Lower Leg: Clean wound with Normal Saline. Anesthetic: Wound #2 Left,Distal Lower Leg: Topical Lidocaine 4% cream applied to wound bed prior to debridement Wound #5 Right,Posterior Lower Leg: Topical Lidocaine 4% cream applied to wound bed prior to debridement Primary Wound Dressing: Wound #2 Left,Distal Lower Leg: Santyl Ointment Iodoform packing Gauze - packed into proximal satellite area Wound #5 Right,Posterior Lower Leg: Santyl Ointment Secondary Dressing: Wound #2 Left,Distal Lower Leg: Boardered Foam Dressing Wound #5 Right,Posterior Lower Leg: Boardered Foam Dressing Dressing Change Frequency: Wound #2 Left,Distal Lower Leg: Change dressing every other  day. - daily if necessary due to drainage Wound #5 Right,Posterior Lower Leg: Change dressing every other day. - daily if necessary due to drainage Follow-up Appointments: Wound #2 Left,Distal Lower Leg: Return Appointment in 1 week. Wound #5 Right,Posterior Lower Leg: Return Appointment in 1 week. Edema Control: Wound #2 Left,Distal Lower Leg: Support Garment 20-30 mm/Hg pressure to: Wound #5 Right,Posterior Lower Leg: Support Garment 20-30 mm/Hg pressure to: Additional Orders / InstructionsSHERRAN, MARGOLIS. ([id number]) Wound #2 Left,Distal Lower Leg: Increase protein intake. Wound #5 Right,Posterior Lower Leg: Increase protein intake. Medications-please add to medication list.: Wound #2 Left,Distal Lower Leg: P.O. Antibiotics - Complete antibiotics prescribed by Dr. Dennie Bible Enzymatic Ointment Wound #5 Right,Posterior Lower Leg: P.O. Antibiotics - Complete antibiotics prescribed by Dr. Dennie Bible Enzymatic Ointment Follow-Up Appointments: A follow-up appointment should be scheduled. A Patient Clinical Summary of Care was provided to LM venous ulcers bilateral legs. Rx withsilver collagen and compression Electronic Signature(s) Signed: 08/04/2015 5:36:04 PM By: Judene Companion MD Entered By: Judene Companion on 08/04/2015 17:09:16 Friesenhahn, Akemi D. ([id number]) -------------------------------------------------------------------------------- SuperBill Details Patient Name: CECILLIA, MENEES D. Date of Service: 08/04/2015 Medical Record Number: [id number] Patient Account Number: [id number] Date of Birth/Sex: 06/22/1946 (68 y.o. 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 L03.116 Cellulitis of left lower limb Facility Procedures CPT4 Code: [id number] Description: 95638 - DEB SUBQ TISSUE 20 SQ CM/< ICD-10 Description Diagnosis E11.622 Type 2 diabetes mellitus with other skin ulcer Modifier: Quantity: 1 CPT4 Code: [id number] Description: 11045 - DEB SUBQ TISS EA ADDL 20CM ICD-10 Description Diagnosis I89.0 Lymphedema, not elsewhere classified Modifier: Quantity: 2 Physician Procedures CPT4: Description Modifier Quantity Code [id number] 60630 - WC PHYS LEVEL 2 - EST PT 1 ICD-10 Description Diagnosis 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 ([id number]) Electronic Signature(s) Signed: 08/04/2015 5:36:04 PM By: Judene Companion MD Entered By: Judene Companion on 08/04/2015 17:10:10

## 2015-08-05 NOTE — Progress Notes (Signed)
Kristin, [name] ([id number]) Visit Report for 08/04/2015 Arrival Information Details Patient Name: [name], [name] Date of Service: 08/04/2015 1:45 PM Medical Record Number: [id number] Patient Account Number: [id number] Date of Birth/Sex: 09-Aug-1946 (69 y.o. Female) Treating RN: [name] Primary Care Physician: Glendon Axe Other Clinician: Referring Physician: Glendon Axe Treating Physician/Extender: Benjaman Pott in Treatment: 26 Visit Information History Since Last Visit Added or deleted any medications: No Patient Arrived: Cane Any new allergies or adverse reactions: No Arrival Time: 14:08 Had a fall or experienced change in No Accompanied By: self activities of daily living that may affect Transfer Assistance: None risk of falls: Patient Identification Verified: Yes Signs or symptoms of abuse/neglect since last No Secondary Verification Process Yes visito Completed: Hospitalized since last visit: No Patient Has Alerts: Yes Pain Present Now: No Patient Alerts: Patient on Blood Thinner Electronic Signature(s) Signed: 08/04/2015 5:24:25 PM By: [name] Entered By: [name] on 08/04/2015 14:09:34 Pienta, Pauletta D. ([id number]) -------------------------------------------------------------------------------- Encounter Discharge Information Details Patient Name: Kristin, MANDEL D. Date of Service: 08/04/2015 1:45 PM Medical Record Number: [id number] Patient Account Number: [id number] Date of Birth/Sex: [birth year] (69 y.o. Female) Treating RN: [name] Primary Care Physician: Glendon Axe Other Clinician: Referring Physician: Glendon Axe Treating Physician/Extender: Benjaman Pott in Treatment: 26 Encounter Discharge Information Items Discharge Pain Level: 0 Discharge Condition: Stable Ambulatory Status: Cane Discharge Destination: Home Transportation: Private Auto Accompanied By: self Schedule Follow-up Appointment:  Yes Medication Reconciliation completed and provided to Patient/Care No Shandricka Monroy: Provided on Clinical Summary of Care: 08/04/2015 Form Type Recipient Paper Patient LM Electronic Signature(s) Signed: 08/04/2015 5:36:04 PM By: Judene Companion MD Previous Signature: 08/04/2015 2:56:03 PM Version By: Ruthine Dose Entered By: Judene Companion on 08/04/2015 17:10:37 Gribble, Alexiss D. ([id number]) -------------------------------------------------------------------------------- Lower Extremity Assessment Details Patient Name: Kristin, KESLING D. Date of Service: 08/04/2015 1:45 PM Medical Record Number: [id number] Patient Account Number: [id number] Date of Birth/Sex: 03-17-1946 (69 y.o. Female) Treating RN: [name] Primary Care Physician: Glendon Axe Other Clinician: Referring Physician: Glendon Axe Treating Physician/Extender: Benjaman Pott in Treatment: 26 Edema Assessment Assessed: [Left: No] [Right: No] Edema: [Left: Yes] [Right: Yes] Calf Left: Right: Point of Measurement: 30 cm From Medial Instep cm cm Ankle Left: Right: Point of Measurement: 9 cm From Medial Instep cm cm Vascular Assessment Pulses: Posterior Tibial Dorsalis Pedis Palpable: [Left:Yes] [Right:Yes] Extremity colors, hair growth, and conditions: Extremity Color: [Left:Hyperpigmented] [Right:Hyperpigmented] Hair Growth on Extremity: [Left:No] [Right:No] Temperature of Extremity: [Left:Warm] [Right:Warm] Capillary Refill: [Left:< 3 seconds] [Right:< 3 seconds] Electronic Signature(s) Signed: 08/04/2015 5:24:25 PM By: [name] Entered By: [name] on 08/04/2015 14:22:02 Holsonback, Sherene D. ([id number]) -------------------------------------------------------------------------------- Multi Wound Chart Details Patient Name: Kristin Stare D. Date of Service: 08/04/2015 1:45 PM Medical Record Number: [id number] Patient Account Number: [id number] Date of Birth/Sex: 1946-04-03 (69 y.o.  Female) Treating RN: [name] Primary Care Physician: Glendon Axe Other Clinician: Referring Physician: Glendon Axe Treating Physician/Extender: Benjaman Pott in Treatment: 26 Vital Signs Height(in): 65 Pulse(bpm): 77 Weight(lbs): 248 Blood Pressure 138/60 (mmHg): Body Mass Index(BMI): 41 Temperature(F): 98.1 Respiratory Rate 18 (breaths/min): Photos: [2:No Photos] [5:No Photos] [N/A:N/A] Wound Location: [2:Left Lower Leg - Distal Right Lower Leg -] [5:Posterior] [N/A:N/A] Wounding Event: [2:Blister] [5:Gradually Appeared] [N/A:N/A] Primary Etiology: [2:Venous Leg Ulcer] [5:Venous Leg Ulcer] [N/A:N/A] Comorbid History: [2:Cataracts, Asthma, Coronary Artery Disease, Coronary Artery Disease, Hypertension, Type II Diabetes, Osteoarthritis, Diabetes, Osteoarthritis, Neuropathy] [5:Cataracts, Asthma, Hypertension, Type II Neuropathy] [N/A:N/A] Date Acquired: [2:12/30/2014] [5:04/29/2015] [N/A:N/A] Weeks of Treatment: [2:26] [5:10] [N/A:N/A] Wound Status: [  2:Open] [5:Open] [N/A:N/A] Clustered Wound: [2:Yes] [5:No] [N/A:N/A] Measurements L x W x D 7.4x5.5x0.5 [5:1.8x2.3x0.1] [N/A:N/A] (cm) Area (cm) : [2:31.966] [5:3.252] [N/A:N/A] Volume (cm) : [2:15.983] [5:0.325] [N/A:N/A] % Reduction in Area: [2:-4421.40%] [5:-1629.80%] [N/A:N/A] % Reduction in Volume: -22411.30% [5:-1610.50%] [N/A:N/A] Position 1 (o'clock): 12 Maximum Distance 1 1.1 (cm): Tunneling: [2:Yes] [5:No] [N/A:N/A] Classification: [2:Full Thickness Without Exposed Support Structures] [5:Unclassifiable] [N/A:N/A] HBO Classification: [2:Grade 1] [5:Grade 1] [N/A:N/A] Exudate Amount: [2:Medium] [5:None Present] [N/A:N/A] Exudate Type: [2:Purulent] [5:N/A] [N/A:N/A] Exudate Color: yellow, brown, green N/A N/A Wound Margin: Distinct, outline attached Distinct, outline attached N/A Granulation Amount: Large (67-100%) None Present (0%) N/A Granulation Quality: Red, Pink, Hyper- N/A  N/A granulation Necrotic Amount: Small (1-33%) Large (67-100%) N/A Necrotic Tissue: Adherent Slough Eschar N/A Exposed Structures: Fascia: No Fascia: No N/A Fat: No Fat: No Tendon: No Tendon: No Muscle: No Muscle: No Joint: No Joint: No Bone: No Bone: No Limited to Skin Limited to Skin Breakdown Breakdown Epithelialization: Small (1-33%) None N/A Periwound Skin Texture: Scarring: Yes Edema: No N/A Edema: No Excoriation: No Excoriation: No Induration: No Induration: No Callus: No Callus: No Crepitus: No Crepitus: No Fluctuance: No Fluctuance: No Friable: No Friable: No Rash: No Rash: No Scarring: No Periwound Skin Moist: Yes Dry/Scaly: Yes N/A Moisture: Maceration: No Maceration: No Dry/Scaly: No Moist: No Periwound Skin Color: Hemosiderin Staining: Yes Hemosiderin Staining: Yes N/A Atrophie Blanche: No Atrophie Blanche: No Cyanosis: No Cyanosis: No Ecchymosis: No Ecchymosis: No Erythema: No Erythema: No Mottled: No Mottled: No Pallor: No Pallor: No Rubor: No Rubor: No Temperature: No Abnormality No Abnormality N/A Tenderness on Yes Yes N/A Palpation: Wound Preparation: Ulcer Cleansing: Ulcer Cleansing: N/A Rinsed/Irrigated with Rinsed/Irrigated with Saline Saline Topical Anesthetic Topical Anesthetic Applied: Other: lidocaine Applied: Other: Lidocaine 4% 4% Ointment Treatment Notes [name], MANALO ([id number]) Electronic Signature(s) Signed: 08/04/2015 5:24:25 PM By: [name] Entered By: [name] on 08/04/2015 14:22:21 Prien, Mattilyn DMarland Kitchen ([id number]) -------------------------------------------------------------------------------- Multi-Disciplinary Care Plan Details Patient Name: AMIRACLE, NEISES D. Date of Service: 08/04/2015 1:45 PM Medical Record Number: [id number] Patient Account Number: [id number] Date of Birth/Sex: [birth year] (69 y.o. Female) Treating RN: [name] Primary Care Physician: Glendon Axe Other  Clinician: Referring Physician: Glendon Axe Treating Physician/Extender: Benjaman Pott in Treatment: 68 Active Inactive Orientation to the Wound Care Program Nursing Diagnoses: Knowledge deficit related to the wound healing center program Goals: Patient/caregiver will verbalize understanding of the [place] Program Date Initiated: 01/31/2015 Goal Status: Active Interventions: Provide education on orientation to the wound center Notes: Venous Leg Ulcer Nursing Diagnoses: Potential for venous Insuffiency (use before diagnosis confirmed) Goals: Non-invasive venous studies are completed as ordered Date Initiated: 01/31/2015 Goal Status: Active Patient/caregiver will verbalize understanding of disease process and disease management Date Initiated: 01/31/2015 Goal Status: Active Interventions: Assess peripheral edema status every visit. Notes: Wound/Skin Impairment Nursing Diagnoses: Impaired tissue integrity Knowledge deficit related to smoking impact on wound healing Monda, Asli D. ([id number]) Goals: Patient/caregiver will verbalize understanding of skin care regimen Date Initiated: 01/31/2015 Goal Status: Active Ulcer/skin breakdown will heal within 14 weeks Date Initiated: 01/31/2015 Goal Status: Active Interventions: Assess ulceration(s) every visit Notes: Electronic Signature(s) Signed: 08/04/2015 5:24:25 PM By: [name] Entered By: [name] on 08/04/2015 14:22:11 Slates, Aviana D. ([id number]) -------------------------------------------------------------------------------- Patient/Caregiver Education Details Patient Name: Kristin Stare D. Date of Service: 08/04/2015 1:45 PM Medical Record Number: [id number] Patient Account Number: [id number] Date of Birth/Gender: 03/14/1946 (69 y.o. Female) Treating RN: [name] Primary Care Physician: Glendon Axe Other Clinician: Referring Physician: Candiss Norse,  [name] Treating Physician/Extender:  Benjaman Pott in Treatment: 26 Education Assessment Education Provided To: Patient Education Topics Provided Wound/Skin Impairment: Handouts: Other: wound care as ordered Methods: Demonstration, Explain/Verbal Responses: State content correctly Electronic Signature(s) Signed: 08/04/2015 5:36:04 PM By: Judene Companion MD Entered By: Judene Companion on 08/04/2015 17:10:43 Chrisley, Ernest D. ([id number]) -------------------------------------------------------------------------------- Wound Assessment Details Patient Name: Kristin Stare D. Date of Service: 08/04/2015 1:45 PM Medical Record Number: [id number] Patient Account Number: [id number] Date of Birth/Sex: Apr 28, 1946 (69 y.o. Female) Treating RN: [name] Primary Care Physician: Glendon Axe Other Clinician: Referring Physician: Glendon Axe Treating Physician/Extender: Benjaman Pott in Treatment: 26 Wound Status Wound Number: 2 Primary Venous Leg Ulcer Etiology: Wound Location: Left Lower Leg - Distal Wound Open Wounding Event: Blister Status: Date Acquired: 12/30/2014 Comorbid Cataracts, Asthma, Coronary Artery Weeks Of Treatment: 26 History: Disease, Hypertension, Type II Clustered Wound: Yes Diabetes, Osteoarthritis, Neuropathy Photos Photo Uploaded By: [name] on 08/04/2015 16:33:38 Wound Measurements Length: (cm) 7.4 % Reduction in Width: (cm) 5.5 % Reduction in Depth: (cm) 0.5 Epithelializati Area: (cm) 31.966 Tunneling: Volume: (cm) 15.983 Position (o Maximum Dist Area: -4421.4% Volume: -22411.3% on: Small (1-33%) Yes 'clock): 12 ance: (cm) 1.1 Undermining: No Wound Description Full Thickness Without Classification: Exposed Support Structures Diabetic Severity Grade 1 (Wagner): Wound Margin: Distinct, outline attached Exudate Amount: Medium Exudate Type: Purulent Exudate Color: yellow, brown, green Simmers, Debany D. ([id number]) Foul Odor After Cleansing: No Wound  Bed Granulation Amount: Large (67-100%) Exposed Structure Granulation Quality: Red, Pink, Hyper-granulation Fascia Exposed: No Necrotic Amount: Small (1-33%) Fat Layer Exposed: No Necrotic Quality: Adherent Slough Tendon Exposed: No Muscle Exposed: No Joint Exposed: No Bone Exposed: No Limited to Skin Breakdown Periwound Skin Texture Texture Color No Abnormalities Noted: No No Abnormalities Noted: No Callus: No Atrophie Blanche: No Crepitus: No Cyanosis: No Excoriation: No Ecchymosis: No Fluctuance: No Erythema: No Friable: No Hemosiderin Staining: Yes Induration: No Mottled: No Localized Edema: No Pallor: No Rash: No Rubor: No Scarring: Yes Temperature / Pain Moisture Temperature: No Abnormality No Abnormalities Noted: No Tenderness on Palpation: Yes Dry / Scaly: No Maceration: No Moist: Yes Wound Preparation Ulcer Cleansing: Rinsed/Irrigated with Saline Topical Anesthetic Applied: Other: lidocaine 4%, Treatment Notes Wound #2 (Left, Distal Lower Leg) 1. Cleansed with: Clean wound with Normal Saline 2. Anesthetic Topical Lidocaine 4% cream to wound bed prior to debridement 4. Dressing Applied: Santyl Ointment Iodoform packing Gauze 5. Secondary Dressing Applied Bordered Foam Dressing Notes iodoform packing gauze to satelite hole on LLE wound DYANE, BROBERG ([id number]) Electronic Signature(s) Signed: 08/04/2015 5:24:25 PM By: [name] Entered By: [name] on 08/04/2015 14:20:07 Ode, Jema D. ([id number]) -------------------------------------------------------------------------------- Wound Assessment Details Patient Name: Bauza, Ivyrose D. Date of Service: 08/04/2015 1:45 PM Medical Record Number: [id number] Patient Account Number: [id number] Date of Birth/Sex: [birth year] (69 y.o. Female) Treating RN: [name] Primary Care Physician: Glendon Axe Other Clinician: Referring Physician: Glendon Axe Treating Physician/Extender:  Benjaman Pott in Treatment: 26 Wound Status Wound Number: 5 Primary Venous Leg Ulcer Etiology: Wound Location: Right Lower Leg - Posterior Wound Open Wounding Event: Gradually Appeared Status: Date Acquired: 04/29/2015 Comorbid Cataracts, Asthma, Coronary Artery Weeks Of Treatment: 10 History: Disease, Hypertension, Type II Clustered Wound: No Diabetes, Osteoarthritis, Neuropathy Photos Photo Uploaded By: [name] on 08/04/2015 16:33:39 Wound Measurements Length: (cm) 1.8 Width: (cm) 2.3 Depth: (cm) 0.1 Area: (cm) 3.252 Volume: (cm) 0.325 % Reduction in Area: -1629.8% % Reduction in Volume: -1610.5% Epithelialization: None Tunneling: No Undermining: No Wound Description Classification:  Unclassifiable Diabetic Severity Earleen Newport): Grade 1 Wound Margin: Distinct, outline attach Exudate Amount: None Present Foul Odor After Cleansing: No ed Wound Bed Granulation Amount: None Present (0%) Exposed Structure Necrotic Amount: Large (67-100%) Fascia Exposed: No Necrotic Quality: Eschar Fat Layer Exposed: No Tendon Exposed: No Muscle Exposed: No Cadmus, Dominika D. ([id number]) Joint Exposed: No Bone Exposed: No Limited to Skin Breakdown Periwound Skin Texture Texture Color No Abnormalities Noted: No No Abnormalities Noted: No Callus: No Atrophie Blanche: No Crepitus: No Cyanosis: No Excoriation: No Ecchymosis: No Fluctuance: No Erythema: No Friable: No Hemosiderin Staining: Yes Induration: No Mottled: No Localized Edema: No Pallor: No Rash: No Rubor: No Scarring: No Temperature / Pain Moisture Temperature: No Abnormality No Abnormalities Noted: No Tenderness on Palpation: Yes Dry / Scaly: Yes Maceration: No Moist: No Wound Preparation Ulcer Cleansing: Rinsed/Irrigated with Saline Topical Anesthetic Applied: Other: Lidocaine 4% Ointment, Treatment Notes Wound #5 (Right, Posterior Lower Leg) 1. Cleansed with: Clean wound with Normal  Saline 2. Anesthetic Topical Lidocaine 4% cream to wound bed prior to debridement 4. Dressing Applied: Santyl Ointment Iodoform packing Gauze 5. Secondary Dressing Applied Bordered Foam Dressing Notes iodoform packing gauze to satelite hole on LLE wound Electronic Signature(s) Signed: 08/04/2015 5:24:25 PM By: [name] Entered By: [name] on 08/04/2015 14:21:37 Valenta, Jansen D. ([id number]) -------------------------------------------------------------------------------- Vitals Details Patient Name: VALLIE, FAYETTE D. Date of Service: 08/04/2015 1:45 PM Medical Record Number: [id number] Patient Account Number: [id number] Date of Birth/Sex: June 14, 1946 (69 y.o. Female) Treating RN: [name] Primary Care Physician: Glendon Axe Other Clinician: Referring Physician: Glendon Axe Treating Physician/Extender: Benjaman Pott in Treatment: 26 Vital Signs Time Taken: 14:10 Temperature (F): 98.1 Height (in): 65 Pulse (bpm): 77 Weight (lbs): 248 Respiratory Rate (breaths/min): 18 Body Mass Index (BMI): 41.3 Blood Pressure (mmHg): 138/60 Reference Range: 80 - 120 mg / dl Electronic Signature(s) Signed: 08/04/2015 5:24:25 PM By: [name] Entered By: [name] on 08/04/2015 14:12:23

## 2015-08-11 ENCOUNTER — Encounter: Payer: PPO | Admitting: Surgery

## 2015-08-11 DIAGNOSIS — L97322 Non-pressure chronic ulcer of left ankle with fat layer exposed: Secondary | ICD-10-CM | POA: Diagnosis not present

## 2015-08-12 NOTE — Progress Notes (Addendum)
[name] ([id number]) Visit Report for 08/11/2015 Chief Complaint Document Details Patient Name: [name], [name] 08/11/2015 3:00 Date of Service: PM Medical Record [id number] Number: Patient Account Number: [id number] [birth year] (69 y.o. Treating RN: [name] Date of Birth/Sex: Female) Other Clinician: Primary Care Physician: [facility], Delana Meyer Treating [name] Referring Physician: Glendon Axe Physician/Extender: Weeks in Treatment: 27 Information Obtained from: Patient Chief Complaint Patient presents to the wound care center for a consult due non healing wound 69 year old patient comes with a history of having a ulcer on the left lower extremity for the past 4 weeks. she says she's had swelling of both lower extremities for about a year after she started having prednisone. 02/07/2015 -- her vascular appointments obtained were in the first and third week of June. she is able to go to [place] and we will try and get her some earlier appointments. Other than that nothing else has changed in her management. Electronic Signature(s) Signed: 08/11/2015 3:47:29 PM By: [name], [name] Entered By: [name] on 08/11/2015 15:47:29 [name] ([id number]) -------------------------------------------------------------------------------- HPI Details Patient Name: [name], [name] 08/11/2015 3:00 Date of Service: PM Medical Record [id number] Number: Patient Account Number: [id number] 06/02/1946 (69 y.o. Treating RN: [name] Date of Birth/Sex: Female) Other Clinician: Primary Care Physician: [facility], Delana Meyer Treating Destinee Taber Referring Physician: Glendon Axe Physician/Extender: Weeks in Treatment: 17 History of Present Illness HPI Description: 69 year old patient who is known to have diabetes mellitus type 2, chronic renal insufficiency, coronary artery disease, hypertension, hypercholesterolemia, temporal arteritis and inflammatory arthritiss  also has a history of having a hysterectomy and some orthopedic related surgeries. The ulcer on the left lower extremity started off as a blister and then. Got progressively worse. She does not have any fever or chills and has not had any recent surgical intervention for this. Her last hemoglobin A1c was 10.1 in September 2015. She has been recently put on doxycycline by her PCP. She is now also allergic to doxycycline and was this was changed over to Keflex. 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 She does see a cardiologist and also takes a diuretic. 02/07/2015 her arterial and venous duplex studies to be done have dates been given as the first and third week of June. This is at [facility]. We are going to try and get early appointments at [facility]. other than that nothing has changed in her management. 02/14/2015 -- we have been able to get her an appointment in [facility] on May 20 which is much earlier than her previous ones at [place]. She continues with her prednisone and her sugars are in the range of 150-200. 02/21/2015 We were able to get a vascular lab workup for her today and she is going to be there at 2:00 this afternoon. the swelling of her leg has gone down significantly but she still has some tenderness over the wounds. 02/28/2015 - She has had one of two vascular workups done, and this coming Tuesday has another, at [facility]. She continues to be on steroid medications. She has significant sensitivity in her left lower extremity and has pain suggestive of neuropathic pain and I have asked her to address this with her primary care physician. 03/07/2015 -- The patient saw Dr. Lucky Cowboy for a consultation and he has had her arteries are okay but she has 2 incompetent veins on the  left lower extremity and he is going to set her up for surgery.  Official report is awaited. Addendum: Official reports are now available and on 03/04/2015. She was seen and lower extremity venous duplex exam was done. There was reflux present within the left greater saphenous vein below the knee and also the left small saphenous vein. Arterial duplex showed normal triphasic waveforms throughout the left lower extremity without any significant stenosis. Her ABIs were noncompressible bilaterally but a waveforms were normal and a digital pressures were normal bilaterally consistent with no significant arterial insufficiency. He has recommended endovenous ablation of both the left small saphenous and the left great saphenous vein. This would still be scheduled later. 03/14/2015 -- she has heard back from the vascular office and has surgery scheduled for sometime in July. [name] ([id number]) Her rheumatologist has decreased her prednisone dosage but she still on it. She has also had cataract surgery in her right eye recently this week. 03/20/2015 - No new complaints today. Pain improved. No fever or chills. Tolerating 2 layer compression. 04/14/2015 -- she was doing very well today she went off on vacation and now her edema has increased markedly the ulceration is bigger and her diabetes is not under control. 04/21/2015 -- I spoke to her PCP Dr. Candiss Norse and discussed the management which would include being seen by a general surgeon for debridement and taking multiple punch biopsies which would help in establishing the diagnosis of this is a vasculitis. She is agreeable about this and will set her up for the procedure with Dr. Tamala Julian at [facility]. She was seen by the surgeon Dr. Jamal Collin. 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 She is scheduled to have laser ablation done next week.Pt was sent here for possible biopsy to look for  vasculitis. Feel it would be better to wait after laser ablation is completed- the ulcer may heal fully and biopsy may not be necessary 04/29/2015 -- she had the venous ablation done by Dr. Lucky Cowboy last Friday and we do not have any notes yet. She is doing fine otherwise. 05/06/2015 --Review of her recent vascular intervention shows that she was seen by Dr. Lucky Cowboy on 04/29/2015. The follow-up duplex which was done showed that both the great saphenous vein and the small saphenous vein remained patent with reflux consistent with an unsuccessful ablation. He has rescheduled her for another the endovenous ablation to be done in about 4 weekso time. 05/13/2015 -- he was seen by her surgeon Dr. Jamal Collin who asked her to continue with conservative therapy and he would speak to Dr. Lucky Cowboy about her management. Dr. Lucky Cowboy is going to schedule her surgery in the middle of August for a repeat endovenous ablation. Her pus culture from last week has grown : [place] her noted her sensitivity report but due to her multiple allergies I had tried clindamycin and she developed a rash with this too. She has been prescribed and anti-buttocks in the ER and is has it at home and she will let is know what she is going to be taking. 05/20/2015 -- she has developed a small spot on her right lower extremity but besides that it is not a full fledged ulceration. She did not get to see Dr. Lucky Cowboy last week and hopefully she will see him in the near future. 05/27/2015 -she is still awaiting her  appointment with Dr.Dew and her vascular procedure is not scheduled until August 19. She will be seeing her PCP tomorrow and I have asked her to convey our discussion so that she is aware that debridement has not been done yet. 06/03/2015 -- was seen by her rheumatologist Dr. Dorthula Matas, who has been treating her for temporal arteritis and  in his note has mentioned the possibility of vasculitis or pyoderma gangrenosum. He is lowering her prednisone to 12-1/2 mg for 1 month and then 10 mg per the next month. I will again make an attempt to speak to her PCP Dr. Candiss Norse and her surgeon Dr. 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 06/17/2015 -- Dr.Dew did her vascular procedure last week and a follow-up venous ultrasound shows good resolution of the veins as per the patient's history. He is to see her back in 2 weeks. [name], [name] ([id number]) 07/07/2015. -- the patient has had a heavy growth of Proteus mirabilis and Enterococcus faecalis. These are sensitive to several drugs but the problem is she has allergies to all of these and hence I would like her to see Dr. Ola Spurr for this. She is also due to see Dr. Lucky Cowboy tomorrow and I will discussed the management with him including debridement under anesthesia and possible biopsies. 07/14/2015 -- she has an appointment to see Dr. Ola Spurr tomorrow and did see Dr. Bunnie Domino PA who will discuss my request with him. 07/21/2015 -- saw Dr. Ola Spurr was able to do a test on her and has put her on amoxicillin. She has been tolerating that and has had no problems with allergies to this. 07/28/2015 -- Last Friday I spoke to Dr. Leotis Pain regarding her care and he said that her right leg did not need any surgery and on the left leg was doing pretty good. We did agree that if she undergoes any procedure in the future he would do a couple of punch biopsies of the wound. Electronic Signature(s) Signed: 08/11/2015 3:47:38 PM By: [name], [name] Entered By: [name] on 08/11/2015 15:47:38 [place], [name] ([id number]) -------------------------------------------------------------------------------- Physical Exam Details Patient Name: [name], [name] 08/11/2015 3:00 Date of  Service: PM Medical Record [id number] Number: Patient Account Number: [id number] 10-08-1946 (68 y.o. Treating RN: [name] Date of Birth/Sex: Female) Other Clinician: Primary Care Physician: [facility], Delana Meyer Treating [name] Referring Physician: Glendon Axe Physician/Extender: Weeks in Treatment: 27 Constitutional . Pulse regular. Respirations normal and unlabored. Afebrile. . Eyes Nonicteric. Reactive to light. Ears, Nose, Mouth, and Throat Lips, teeth, and gums WNL.Marland Kitchen Moist mucosa without lesions . Neck supple and nontender. No palpable supraclavicular or cervical adenopathy. Normal sized without goiter. Respiratory WNL. No retractions.. Cardiovascular Pedal Pulses WNL. No clubbing, cyanosis or edema. Lymphatic No adneopathy. No adenopathy. No adenopathy. Musculoskeletal Adexa without tenderness or enlargement.. Digits and nails w/o clubbing, cyanosis, infection, petechiae, ischemia, or inflammatory conditions.. Integumentary (Hair, Skin) No suspicious lesions. No crepitus or fluctuance. No peri-wound warmth or erythema. No masses.Marland Kitchen Psychiatric Judgement and insight Intact.. No evidence of depression, anxiety, or agitation.. Notes the wound on the left lower extremity is looking much cleaner and is less tender. We will continue local care. The right lower extremity wound now has some debris and this will need autolytic debridement. Electronic Signature(s) Signed: 08/11/2015 3:48:20 PM By: [name], [name] Entered By: [name] on 08/11/2015 15:48:20 Corpening, Tiphanie D. ([id number]) --------------------------------------------------------------------------------  Physician Orders Details Patient Name: [name], [name] 08/11/2015 3:00 Date of Service: PM Medical Record [id number] Number: Patient Account Number: [id number] [birth year] (69 y.o. Treating RN: [name] Date of Birth/Sex: Female) Other Clinician: Primary Care Physician: [facility], Delana Meyer  Treating [name] Referring Physician: Glendon Axe Physician/Extender: [place] in Treatment: 41 Verbal / Phone Orders: Yes Clinician: [name] Read Back and Verified: Yes Diagnosis Coding Wound Cleansing Wound #2 Left,Distal Lower Leg o Clean wound with Normal Saline. Wound #5 Right,Posterior Lower Leg o Clean wound with Normal Saline. Anesthetic Wound #2 Left,Distal Lower Leg o Topical Lidocaine 4% cream applied to wound bed prior to debridement Wound #5 Right,Posterior Lower Leg o Topical Lidocaine 4% cream applied to wound bed prior to debridement Primary Wound Dressing Wound #2 Left,Distal Lower Leg o Iodoform packing Gauze - packed into proximal satellite area o Other: - siltec sorbact Wound #5 Right,Posterior Lower Leg o Medihoney gel Secondary Dressing Wound #5 Right,Posterior Lower Leg o Boardered Foam Dressing Dressing Change Frequency Wound #2 Left,Distal Lower Leg o Change dressing every other day. - daily if necessary due to drainage Wound #5 Right,Posterior Lower Leg o Change dressing every other day. - daily if necessary due to drainage Follow-up Appointments [name], TRABERT. ([id number]) Wound #2 Left,Distal Lower Leg o Return Appointment in 1 week. Wound #5 Right,Posterior Lower Leg o Return Appointment in 1 week. Edema Control Wound #2 Left,Distal Lower Leg o Support Garment 20-30 mm/Hg pressure to: Wound #5 Right,Posterior Lower Leg o Support Garment 20-30 mm/Hg pressure to: Additional Orders / Instructions Wound #2 Left,Distal Lower Leg o Increase protein intake. Wound #5 Right,Posterior Lower Leg o Increase protein intake. Medications-please add to medication list. Wound #2 Left,Distal Lower Leg o P.O. Antibiotics - Complete antibiotics prescribed by Dr. Ola Spurr Wound #5 Right,Posterior Lower Leg o P.O. Antibiotics - Complete antibiotics prescribed by Dr. Ola Spurr Electronic Signature(s) Signed:  08/11/2015 4:43:13 PM By: [name], [name] Signed: 08/11/2015 6:09:37 PM By: [name] RN, [name], [name], [name] Entered By: [name], RN, [name], Kim on 08/11/2015 15:28:07 [name], [name] ([id number]) -------------------------------------------------------------------------------- Problem List Details Patient Name: [name], [name] 08/11/2015 3:00 Date of Service: PM Medical Record [id number] Number: Patient Account Number: [id number] November 04, 1945 (69 y.o. 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 ([id number]) -------------------------------------------------------------------------------- Progress Note Details Patient Name: [name], [name] 08/11/2015 3:00 Date of Service: PM Medical Record [id number] Number: Patient Account Number: [id number] 1946/08/04 (69 y.o. Treating RN: [name] Date of Birth/Sex: Female) Other Clinician: Primary Care Physician: [facility], Delana Meyer Treating [name] Referring Physician:  Glendon Axe Physician/Extender: Suella Grove in Treatment: 27 Subjective Chief Complaint Information obtained from Patient Patient presents to the wound care center for a consult due  non healing wound 69 year old patient comes with a history of having a ulcer on the left lower extremity for the past 4 weeks. she says she's had swelling of both lower extremities for about a year after she started having prednisone. 02/07/2015 -- her vascular appointments obtained were in the first and third week of June. she is able to go to [place] and we will try and get her some earlier appointments. Other than that nothing else has changed in her management. History of Present Illness (HPI) 69 year old patient who is known to have diabetes mellitus type 2, chronic renal insufficiency, coronary artery disease, hypertension, hypercholesterolemia, temporal arteritis and inflammatory arthritiss also has a history of having a hysterectomy and some orthopedic related surgeries. The ulcer on the left lower extremity started off as a blister and then. Got progressively worse. She does not have any fever or chills and has not had any recent surgical intervention for this. Her last hemoglobin A1c was 10.1 in September 2015. She has been recently put on doxycycline by her PCP. She is now also allergic to doxycycline and was this was changed over to Keflex. 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 She does see a cardiologist and also takes a diuretic. 02/07/2015 her arterial and venous duplex studies to be done have dates been given as the first and third week of June. This is at [facility]. We are going to try and get early appointments at [facility]. other than that nothing has changed in her management. 02/14/2015 -- we have been able to get her an appointment in [facility] on May 20 which is much  earlier than her previous ones at [place]. She continues with her prednisone and her sugars are in the range of 150-200. 02/21/2015 We were able to get a vascular lab workup for her today and she is going to be there at 2:00 this afternoon. the swelling of her leg has gone down significantly but she still has some tenderness over the wounds. 02/28/2015 - She has had one of two vascular workups done, and this coming Tuesday has another, at [facility]. She continues to be on steroid medications. She has significant sensitivity in her left lower extremity and has pain suggestive of neuropathic pain and I have asked her to address this with her primary care physician. 03/07/2015 -- The patient saw Dr. Lucky Cowboy for a consultation and he has had her arteries are okay but she has 2 incompetent veins on the left lower extremity and he is going to set her up for surgery. Official report is [place] ([id number]) awaited. Addendum: Official reports are now available and on 03/04/2015. She was seen and lower extremity venous duplex exam was done. There was reflux present within the left greater saphenous vein below the knee and also the left small saphenous vein. Arterial duplex showed normal triphasic waveforms throughout the left lower extremity without any significant stenosis. Her ABIs were noncompressible bilaterally but a waveforms were normal and a digital pressures were normal bilaterally consistent with no significant arterial insufficiency. He has recommended endovenous ablation of both the left small saphenous and the left great saphenous vein. This would still be scheduled later. 03/14/2015 -- she has heard back from the vascular office and has surgery scheduled for sometime in July. Her rheumatologist has decreased her prednisone dosage but she still on it. She  has also had cataract surgery in her right eye recently this week. 03/20/2015 - No new complaints today.  Pain improved. No fever or chills. Tolerating 2 layer compression. 04/14/2015 -- she was doing very well today she went off on vacation and now her edema has increased markedly the ulceration is bigger and her diabetes is not under control. 04/21/2015 -- I spoke to her PCP Dr. Candiss Norse and discussed the management which would include being seen by a general surgeon for debridement and taking multiple punch biopsies which would help in establishing the diagnosis of this is a vasculitis. She is agreeable about this and will set her up for the procedure with Dr. Tamala Julian at [facility]. She was seen by the surgeon Dr. Jamal Collin. 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 She is scheduled to have laser ablation done next week.Pt was sent here for possible biopsy to look for vasculitis. Feel it would be better to wait after laser ablation is completed- the ulcer may heal fully and biopsy may not be necessary 04/29/2015 -- she had the venous ablation done by Dr. Lucky Cowboy last Friday and we do not have any notes yet. She is doing fine otherwise. 05/06/2015 --Review of her recent vascular intervention shows that she was seen by Dr. Lucky Cowboy on 04/29/2015. The follow-up duplex which was done showed that both the great saphenous vein and the small saphenous vein remained patent with reflux consistent with an unsuccessful ablation. He has rescheduled her for another the endovenous ablation to be done in about 4 weeks time. 05/13/2015 -- he was seen by her surgeon Dr. Jamal Collin who asked her to continue with conservative therapy and he would speak to Dr. Lucky Cowboy about her management. Dr. Lucky Cowboy is going to schedule her surgery in the middle of August for a repeat endovenous ablation. Her pus culture from last week has grown : [place] her noted her sensitivity report but due to her multiple allergies I had tried clindamycin and she developed a rash with this too. She has been prescribed and anti-buttocks in the ER and is has it at home and she will let is know what she is going to be taking. 05/20/2015 -- she has developed a small spot on her right lower extremity but besides that it is not a full fledged ulceration. She did not get to see Dr. Lucky Cowboy last week and hopefully she will see him in the near future. 05/27/2015 -she is still awaiting her appointment with Dr.Dew and her vascular procedure is not scheduled until August 19. She will be seeing her PCP tomorrow and I have asked her to convey our discussion so that she is aware that debridement has not been done yet. [name], [name] ([id number]) 06/03/2015 -- was seen by her rheumatologist Dr. 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 He is lowering her prednisone to 12-1/2 mg for 1 month and then 10 mg per the next month. I will again make an attempt to speak to her PCP Dr. Candiss Norse and her surgeon Dr. 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 06/17/2015 -- Dr.Dew did her vascular procedure last week and a follow-up venous ultrasound shows  good resolution of the veins as per the patient's history. He is to see her back in 2 weeks. 07/07/2015. -- the patient has had a heavy growth of Proteus mirabilis and Enterococcus faecalis. These are sensitive to several drugs but the problem is she has allergies to all of these and hence I would like her to see Dr. Ola Spurr for this. She is also due to see Dr. Lucky Cowboy tomorrow and I will discussed the management with him including debridement under anesthesia and possible biopsies. 07/14/2015 -- she has  an appointment to see Dr. Ola Spurr tomorrow and did see Dr. Bunnie Domino PA who will discuss my request with him. 07/21/2015 -- saw Dr. Ola Spurr was able to do a test on her and has put her on amoxicillin. She has been tolerating that and has had no problems with allergies to this. 07/28/2015 -- Last Friday I spoke to Dr. Leotis Pain regarding her care and he said that her right leg did not need any surgery and on the left leg was doing pretty good. We did agree that if she undergoes any procedure in the future he would do a couple of punch biopsies of the wound. Objective Constitutional Pulse regular. Respirations normal and unlabored. Afebrile. Vitals Time Taken: 3:09 PM, Height: 65 in, Weight: 248 lbs, BMI: 41.3, Temperature: 97.7 F, Pulse: 68 bpm, Respiratory Rate: 18 breaths/min, Blood Pressure: 144/60 mmHg. Eyes Nonicteric. Reactive to light. Ears, Nose, Mouth, and Throat Lips, teeth, and gums WNL.Marland Kitchen Moist mucosa without lesions . Neck supple and nontender. No palpable supraclavicular or cervical adenopathy. Normal sized without goiter. Respiratory WNL. No retractions.Marland Kitchen [name], NOBREGA. ([id number]) Cardiovascular Pedal Pulses WNL. No clubbing, cyanosis or edema. Lymphatic No adneopathy. No adenopathy. No adenopathy. Musculoskeletal Adexa without tenderness or enlargement.. Digits and nails w/o clubbing, cyanosis, infection, petechiae, ischemia, or inflammatory conditions.Marland Kitchen Psychiatric Judgement and insight Intact.. No evidence of depression, anxiety, or agitation.. General Notes: the wound on the left lower extremity is looking much cleaner and is less tender. We will continue local care. The right lower extremity wound now has some debris and this will need autolytic debridement. Integumentary (Hair, Skin) No suspicious lesions. No crepitus or fluctuance. No peri-wound warmth or erythema. No masses.. Wound #2 status is Open. Original cause of wound was Blister. The wound is  located on the Left,Distal Lower Leg. The wound measures 5.5cm length x 5.1cm width x 0.3cm depth; 22.03cm^2 area and 6.609cm^3 volume. The wound is limited to skin breakdown. There is a medium amount of purulent drainage noted. The wound margin is distinct with the outline attached to the wound base. There is large (67-100%) red, pink granulation within the wound bed. There is a small (1-33%) amount of necrotic tissue within the wound bed including Adherent Slough. The periwound skin appearance exhibited: Scarring, Moist, Hemosiderin Staining. The periwound skin appearance did not exhibit: Callus, Crepitus, Excoriation, Fluctuance, Friable, Induration, Localized Edema, Rash, Dry/Scaly, Maceration, Atrophie Blanche, Cyanosis, Ecchymosis, Mottled, Pallor, Rubor, Erythema. Periwound temperature was noted as No Abnormality. The periwound has tenderness on palpation. Wound #5 status is Open. Original cause of wound was Gradually Appeared. The wound is located on the Right,Posterior Lower Leg. The wound measures 2.3cm length x 2.5cm width x 0.2cm depth; 4.516cm^2 area and 0.903cm^3 volume. The wound is limited to skin breakdown. There is a large amount of serous drainage noted. The wound margin is distinct with the outline attached to the wound base. There is no granulation within the wound bed. There is a large (67-100%)  amount of necrotic tissue within the wound bed including Eschar. The periwound skin appearance exhibited: Hemosiderin Staining. The periwound skin appearance did not exhibit: Callus, Crepitus, Excoriation, Fluctuance, Friable, Induration, Localized Edema, Rash, Scarring, Dry/Scaly, Maceration, Moist, Atrophie Blanche, Cyanosis, Ecchymosis, Mottled, Pallor, Rubor, Erythema. Periwound temperature was noted as No Abnormality. The periwound has tenderness on palpation. 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 On the right lower extremity we will use many her knee and some occlusive dressing with compression stockings. She will come back to see me next week. Plan Wound Cleansing: Wound #2 Left,Distal Lower Leg: Clean wound with Normal Saline. Wound #5 Right,Posterior Lower Leg: Clean wound with Normal Saline. Anesthetic: Wound #2 Left,Distal Lower Leg: Topical Lidocaine 4% cream applied to wound bed prior to debridement Wound #5 Right,Posterior Lower Leg: Topical Lidocaine 4% cream applied to wound bed prior to debridement Primary Wound Dressing: Wound #2 Left,Distal Lower Leg: Iodoform packing Gauze - packed into proximal satellite area Other: - siltec sorbact Wound #5 Right,Posterior Lower Leg: Medihoney gel Secondary Dressing: Wound #5 Right,Posterior Lower Leg: Boardered Foam Dressing Dressing Change Frequency: Wound #2 Left,Distal Lower Leg: Change dressing every other day. - daily if necessary due to drainage Wound #5 Right,Posterior Lower Leg: Change dressing every other day. - daily if necessary due to drainage Follow-up Appointments: Wound #2 Left,Distal Lower Leg: Beagley, Henslee D. ([id number]) Return Appointment in 1 week. Wound #5 Right,Posterior Lower Leg: Return Appointment in 1 week. Edema Control: Wound #2 Left,Distal Lower Leg: Support Garment 20-30 mm/Hg pressure to: Wound #5 Right,Posterior Lower Leg: Support Garment 20-30 mm/Hg pressure to: Additional Orders / Instructions: Wound  #2 Left,Distal Lower Leg: Increase protein intake. Wound #5 Right,Posterior Lower Leg: Increase protein intake. Medications-please add to medication list.: Wound #2 Left,Distal Lower Leg: P.O. Antibiotics - Complete antibiotics prescribed by Dr. Ola Spurr Wound #5 Right,Posterior Lower Leg: P.O. Antibiotics - Complete antibiotics prescribed by Dr. Ola Spurr On the left lower extremity we'll use Siltec Sorbact and her compression stockings. On the right lower extremity we will use many her knee and some occlusive dressing with compression stockings. She will come back to see me next week. Electronic Signature(s) Signed: 08/12/2015 4:17:17 PM By: [name], [name] Previous Signature: 08/11/2015 3:49:34 PM Version By: [name], [name] Entered By: [name] on 08/12/2015 16:17:17 Birnbaum, Briel D. ([id number]) -------------------------------------------------------------------------------- SuperBill Details Patient Name: JASHA, HODZIC D. Date of Service: 08/11/2015 Medical Record Number: [id number] Patient Account Number: [id number] Date of Birth/Sex: [birth year] (68 y.o. 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 I83.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 with  both ulcer of ankle and inflammation L03.116 Cellulitis of left lower limb Facility Procedures CPT4 Code: [id number] Description: 41423 - WOUND CARE VISIT-LEV 3 EST PT Modifier: Quantity: 1 Physician  Procedures CPT4: Description Modifier Quantity Code [id number] - WC PHYS LEVEL 3 - EST PT 1 ICD-10 Description Diagnosis E11.622 Type 2 diabetes mellitus with other skin ulcer L97.322 Non-pressure chronic ulcer of left ankle with fat layer exposed I89.0  Lymphedema, not elsewhere classified 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

## 2015-08-12 NOTE — Progress Notes (Signed)
[name], [name] ([id number]) Visit Report for [name] Arrival Information Details Patient Name: [name], [name] Date of Service: [name] 3:00 PM Medical Record Number: [id number] Patient Account Number: [id number] Date of Birth/Sex: 21-Aug-1946 (68 y.o. Female) Treating RN: [name] Primary Care Physician: [name] Other Clinician: Referring Physician: Glendon [name] Treating Physician/Extender: [name] in Treatment: 3 Visit Information History Since Last Visit Added or deleted any medications: No Patient Arrived: Ambulatory Any new allergies or adverse reactions: No Arrival Time: 15:06 Had a fall or experienced change in No Accompanied By: self activities of daily living that may affect Transfer Assistance: None risk of falls: Patient Identification Verified: Yes Signs or symptoms of abuse/neglect since last No Secondary Verification Process Yes visito Completed: Hospitalized since last visit: No Patient Has Alerts: Yes Has Dressing in Place as Prescribed: Yes Patient Alerts: Patient on Blood Pain Present Now: No Thinner Electronic Signature(s) Signed: 08/11/2015 6:09:37 PM By: Kristin Cool, RN, [name], [name] Entered By: Kristin Cool, RN, [name], [name] 15:09:05 [name], [name] Kitchen ([id number]) -------------------------------------------------------------------------------- Clinic Level of Care Assessment Details Patient Name: [name] Date of Service: [name] 3:00 PM Medical Record Number: [id number] Patient Account Number: [id number] Date of Birth/Sex: [birth year] (68 y.o. Female) Treating RN: [name] Primary Care Physician: [name] Other Clinician: Referring Physician: Glendon [name] Treating Physician/Extender: [name] in Treatment: 27 Clinic Level of Care Assessment Items TOOL 2 Quantity Score []  - Use when only an EandM is performed on the INITIAL visit 0 ASSESSMENTS - Nursing Assessment / Reassessment []  -  General Physical Exam (combine w/ comprehensive assessment (listed just 0 below) when performed on new pt. evals) X - Comprehensive Assessment (HX, ROS, Risk Assessments, Wounds Hx, etc.) 1 25 ASSESSMENTS - Wound and Skin Assessment / Reassessment X - Simple Wound Assessment / Reassessment - one wound 1 5 []  - Complex Wound Assessment / Reassessment - multiple wounds 0 []  - Dermatologic / Skin Assessment (not related to wound area) 0 ASSESSMENTS - Ostomy and/or Continence Assessment and Care []  - Incontinence Assessment and Management 0 []  - Ostomy Care Assessment and Management (repouching, etc.) 0 PROCESS - Coordination of Care X - Simple Patient / Family Education for ongoing care 1 15 []  - Complex (extensive) Patient / Family Education for ongoing care 0 X - Staff obtains Programmer, systems, Records, Test Results / Process Orders 1 10 []  - Staff telephones HHA, Nursing Homes / Clarify orders / etc 0 []  - Routine Transfer to another Facility (non-emergent condition) 0 []  - Routine Hospital Admission (non-emergent condition) 0 []  - New Admissions / Biomedical engineer / Ordering NPWT, Apligraf, etc. 0 []  - Emergency Hospital Admission (emergent condition) 0 X - Simple Discharge Coordination 1 10 [name], [name] ([id number]) []  - Complex (extensive) Discharge Coordination 0 PROCESS - Special Needs []  - Pediatric / Minor Patient Management 0 []  - Isolation Patient Management 0 []  - Hearing / Language / Visual special needs 0 []  - Assessment of Community assistance (transportation, [name], etc.) 0 []  - Additional assistance / Altered mentation 0 []  - Support Surface(s) Assessment (bed, cushion, seat, etc.) 0 INTERVENTIONS - Wound Cleansing / Measurement X - Wound Imaging (photographs - any number of wounds) 1 5 []  - Wound Tracing (instead of photographs) 0 X - Simple Wound Measurement - one wound 1 5 []  - Complex Wound Measurement - multiple wounds 0 X - Simple Wound Cleansing - one  wound 1 5 []  - Complex Wound Cleansing - multiple wounds 0  INTERVENTIONS - Wound Dressings X - Small Wound Dressing one or multiple wounds 1 10 []  - Medium Wound Dressing one or multiple wounds 0 []  - Large Wound Dressing one or multiple wounds 0 []  - Application of Medications - injection 0 INTERVENTIONS - Miscellaneous []  - External ear exam 0 []  - Specimen Collection (cultures, biopsies, blood, body fluids, etc.) 0 []  - Specimen(s) / Culture(s) sent or taken to Lab for analysis 0 []  - Patient Transfer (multiple staff / [name]) 0 []  - Simple Staple / Suture removal (25 or less) 0 []  - Complex Staple / Suture removal (26 or more) 0 [name], [name] ([id number]) []  - Hypo / Hyperglycemic Management (close monitor of Blood Glucose) 0 []  - Ankle / Brachial Index (ABI) - do not check if billed separately 0 Has the patient been seen at the hospital within the last three years: Yes Total Score: 90 Level Of Care: New/Established - Level 3 Electronic Signature(s) Signed: 08/11/2015 6:09:37 PM By: Kristin Cool, RN, [name], [name] Entered By: Kristin Cool, RN, [name], [name] 15:40:19 [name], [name] Kitchen ([id number]) -------------------------------------------------------------------------------- Encounter Discharge Information Details Patient Name: [name], [name] Date of Service: [name] 3:00 PM Medical Record Number: [id number] Patient Account Number: [id number] Date of Birth/Sex: [birth year] (68 y.o. Female) Treating RN: [name] Primary Care Physician: [name] Other Clinician: Referring Physician: Glendon [name] Treating Physician/Extender: [name] in Treatment: 59 Encounter Discharge Information Items Discharge Pain Level: 0 Discharge Condition: Stable Ambulatory Status: Ambulatory Discharge Destination: Home Transportation: Private Auto Accompanied By: self Schedule Follow-up Appointment: Yes Medication Reconciliation completed and provided  to Patient/Care Yes [name]: Provided on Clinical Summary of Care: [name] Form Type Recipient Paper Patient LM Electronic Signature(s) Signed: 08/11/2015 6:09:37 PM By: Kristin Cool RN, [name], [name] Previous Signature: [name] 3:40:59 PM Version By: Ruthine Dose Entered By: Kristin Cool RN, [name], [name] 15:44:41 [name], [name] ([id number]) -------------------------------------------------------------------------------- Lower Extremity Assessment Details Patient Name: DIAHN, WAIDELICH [name] Date of Service: [name] 3:00 PM Medical Record Number: [id number] Patient Account Number: [id number] Date of Birth/Sex: [birth year] (68 y.o. Female) Treating RN: [name] Primary Care Physician: [name] Other Clinician: Referring Physician: Glendon [name] Treating Physician/Extender: [name] in Treatment: 27 Edema Assessment Assessed: [Left: No] [Right: No] E[Left: dema] [Right: :] Calf Left: Right: Point of Measurement: 30 cm From Medial Instep 36.4 cm 36.5 cm Ankle Left: Right: Point of Measurement: 9 cm From Medial Instep 26.5 cm 25 cm Vascular Assessment Pulses: Posterior Tibial Dorsalis Pedis Palpable: [Left:Yes] [Right:Yes] Extremity colors, hair growth, and conditions: Extremity Color: [Left:Hyperpigmented] [Right:Hyperpigmented] Hair Growth on Extremity: [Left:No] [Right:No] Temperature of Extremity: [Left:Warm] [Right:Warm] Capillary Refill: [Left:< 3 seconds] [Right:< 3 seconds] Toe Nail Assessment Left: Right: Thick: No No Discolored: No No Deformed: No No Improper Length and Hygiene: No No Electronic Signature(s) Signed: 08/11/2015 6:09:37 PM By: Kristin Cool, RN, [name], [name] Entered By: Kristin Cool, RN, [name], [name] 15:14:11 [name], [name] ([id number]) -------------------------------------------------------------------------------- Multi Wound Chart Details Patient Name: [name] Date of Service: [name] 3:00 PM Medical  Record Number: [id number] Patient Account Number: [id number] Date of Birth/Sex: [birth year] (68 y.o. Female) Treating RN: [name] Primary Care Physician: [name] Other Clinician: Referring Physician: Glendon [name] Treating Physician/Extender: [name] in Treatment: 27 Vital Signs Height(in): 65 Pulse(bpm): 68 Weight(lbs): 248 Blood Pressure 144/60 (mmHg): Body Mass Index(BMI): 41 Temperature(F): 97.7 Respiratory Rate 18 (breaths/min): Photos: [2:No Photos] [5:No Photos] [N/A:N/A] Wound Location: [2:Left Lower Leg -  Distal Right Lower Leg -] [5:Posterior] [N/A:N/A] Wounding Event: [2:Blister] [5:Gradually Appeared] [N/A:N/A] Primary Etiology: [2:Venous Leg Ulcer] [5:Venous Leg Ulcer] [N/A:N/A] Comorbid History: [2:Cataracts, Asthma, Coronary Artery Disease, Coronary Artery Disease, Hypertension, Type II Diabetes, Osteoarthritis, Diabetes, Osteoarthritis, Neuropathy] [5:Cataracts, Asthma, Hypertension, Type II Neuropathy] [N/A:N/A] Date Acquired: [2:12/30/2014] [5:04/29/2015] [N/A:N/A] Weeks of Treatment: [2:27] [5:11] [N/A:N/A] Wound Status: [2:Open] [5:Open] [N/A:N/A] Clustered Wound: [2:Yes] [5:No] [N/A:N/A] Measurements L x W x [name] 5.5x5.1x0.3 [5:2.3x2.5x0.2] [N/A:N/A] (cm) Area (cm) : [2:22.03] [5:4.516] [N/A:N/A] Volume (cm) : [2:6.609] [5:0.903] [N/A:N/A] % Reduction in Area: [2:-3016.00%] [5:-2302.10%] [N/A:N/A] % Reduction in Volume: -9208.50% [5:-4652.60%] [N/A:N/A] Classification: [2:Full Thickness Without Exposed Support Structures] [5:Full Thickness Without Exposed Support Structures] [N/A:N/A] HBO Classification: [2:Grade 1] [5:Grade 1] [N/A:N/A] Exudate Amount: [2:Medium] [5:None Present] [N/A:N/A] Exudate Type: [2:Purulent] [5:N/A] [N/A:N/A] Exudate Color: [2:yellow, brown, green] [5:N/A] [N/A:N/A] Wound Margin: [2:Distinct, outline attached Distinct, outline attached] [N/A:N/A] Granulation Amount: [2:Large (67-100%)] [5:None Present (0%)]  [N/A:N/A] Granulation Quality: [5:N/A] [N/A:N/A] Red, Pink, Hyper- granulation Necrotic Amount: Small (1-33%) Large (67-100%) N/A Necrotic Tissue: Adherent Slough Eschar N/A Exposed Structures: Fascia: No Fascia: No N/A Fat: No Fat: No Tendon: No Tendon: No Muscle: No Muscle: No Joint: No Joint: No Bone: No Bone: No Limited to Skin Limited to Skin Breakdown Breakdown Epithelialization: Small (1-33%) None N/A Periwound Skin Texture: Scarring: Yes Edema: No N/A Edema: No Excoriation: No Excoriation: No Induration: No Induration: No Callus: No Callus: No Crepitus: No Crepitus: No Fluctuance: No Fluctuance: No Friable: No Friable: No Rash: No Rash: No Scarring: No Periwound Skin Moist: Yes Maceration: No N/A Moisture: Maceration: No Moist: No Dry/Scaly: No Dry/Scaly: No Periwound Skin Color: Hemosiderin Staining: Yes Hemosiderin Staining: Yes N/A Atrophie Blanche: No Atrophie Blanche: No Cyanosis: No Cyanosis: No Ecchymosis: No Ecchymosis: No Erythema: No Erythema: No Mottled: No Mottled: No Pallor: No Pallor: No Rubor: No Rubor: No Temperature: No Abnormality No Abnormality N/A Tenderness on Yes Yes N/A Palpation: Wound Preparation: Ulcer Cleansing: Ulcer Cleansing: N/A Rinsed/Irrigated with Rinsed/Irrigated with Saline Saline Topical Anesthetic Topical Anesthetic Applied: Other: lidocaine Applied: Other: Lidocaine 4% 4% Ointment Treatment Notes Electronic Signature(s) Signed: 08/11/2015 6:09:37 PM By: Kristin Cool, RN, [name], [name] Entered By: Kristin Cool, RN, [name], [name] 15:20:18 [name], [name] ([id number]) [name], [name] ([id number]) -------------------------------------------------------------------------------- Multi-Disciplinary Care Plan Details Patient Name: GABRYEL, TALAMO. Date of Service: [name] 3:00 PM Medical Record Number: [id number] Patient Account Number: [id number] Date of Birth/Sex: 01/18/1946 (68 y.o.  Female) Treating RN: [name] Primary Care Physician: [name] Other Clinician: Referring Physician: Glendon [name] Treating Physician/Extender: [name] in Treatment: 30 Active Inactive Orientation to the Wound Care Program Nursing Diagnoses: Knowledge deficit related to the wound healing center program Goals: Patient/caregiver will verbalize understanding of the [facility] Program Date Initiated: 01/31/2015 Goal Status: Active Interventions: Provide education on orientation to the wound center Notes: Venous Leg Ulcer Nursing Diagnoses: Potential for venous Insuffiency (use before diagnosis confirmed) Goals: Non-invasive venous studies are completed as ordered Date Initiated: 01/31/2015 Goal Status: Active Patient/caregiver will verbalize understanding of disease process and disease management Date Initiated: 01/31/2015 Goal Status: Active Interventions: Assess peripheral edema status every visit. Notes: Wound/Skin Impairment Nursing Diagnoses: Impaired tissue integrity Knowledge deficit related to smoking impact on wound healing [name], [name] ([id number]) Goals: Patient/caregiver will verbalize understanding of skin care regimen Date Initiated: 01/31/2015 Goal Status: Active Ulcer/skin breakdown will heal within 14 weeks Date Initiated: 01/31/2015 Goal Status: Active Interventions: Assess ulceration(s) every visit Notes: Electronic Signature(s) Signed: 08/11/2015 6:09:37 PM  By: Kristin Cool, RN, [name], [name] Entered By: Kristin Cool, RN, [name], [name] 15:20:10 [name], [name] ([id number]) -------------------------------------------------------------------------------- Pain Assessment Details Patient Name: BOBETTE, LEYH [name] Date of Service: [name] 3:00 PM Medical Record Number: [id number] Patient Account Number: [id number] Date of Birth/Sex: 07-06-1946 (68 y.o. Female) Treating RN: [name] Primary Care Physician: [name] Other  Clinician: Referring Physician: Glendon [name] Treating Physician/Extender: [name] in Treatment: 27 Active Problems Location of Pain Severity and Description of Pain Patient Has Paino No Site Locations Pain Management and Medication Current Pain Management: Electronic Signature(s) Signed: 08/11/2015 6:09:37 PM By: Kristin Cool, RN, [name], [name] Entered By: Kristin Cool, RN, [name], [name] 15:09:12 Joerger, [name] ([id number]) -------------------------------------------------------------------------------- Patient/Caregiver Education Details Patient Name: [name] Date of Service: [name] 3:00 PM Medical Record Number: [id number] Patient Account Number: [id number] Date of Birth/Gender: 13-Mar-1946 (68 y.o. Female) Treating RN: [name] Primary Care Physician: [name] Other Clinician: Referring Physician: Glendon [name] Treating Physician/Extender: [name] in Treatment: 49 Education Assessment Education Provided To: Patient Education Topics Provided Wound/Skin Impairment: Handouts: Caring for Your Ulcer, Other: continue wound care as prescribed Methods: Demonstration, Explain/Verbal Responses: State content correctly Electronic Signature(s) Signed: 08/11/2015 6:09:37 PM By: Kristin Cool, RN, [name], [name] Entered By: Kristin Cool, RN, [name], [name] 15:44:59 Abshier, Dottie DMarland Kitchen ([id number]) -------------------------------------------------------------------------------- Wound Assessment Details Patient Name: LIBORIA, PUTNAM [name] Date of Service: [name] 3:00 PM Medical Record Number: [id number] Patient Account Number: [id number] Date of Birth/Sex: [birth year] (68 y.o. Female) Treating RN: [name] Primary Care Physician: [name] Other Clinician: Referring Physician: Glendon [name] Treating Physician/Extender: [name] in Treatment: 27 Wound Status Wound Number: 2 Primary Venous Leg Ulcer Etiology: Wound Location: Left  Lower Leg - Distal Wound Open Wounding Event: Blister Status: Date Acquired: 12/30/2014 Comorbid Cataracts, Asthma, Coronary Artery Weeks Of Treatment: 27 History: Disease, Hypertension, Type II Clustered Wound: Yes Diabetes, Osteoarthritis, Neuropathy Wound Measurements Length: (cm) 5.5 Width: (cm) 5.1 Depth: (cm) 0.3 Area: (cm) 22.03 Volume: (cm) 6.609 % Reduction in Area: -3016% % Reduction in Volume: -9208.5% Epithelialization: Small (1-33%) Wound Description Full Thickness Without Classification: Exposed Support Structures Diabetic Severity Grade 1 (Wagner): Wound Margin: Distinct, outline attached Exudate Amount: Medium Exudate Type: Purulent Exudate Color: yellow, brown, green Foul Odor After Cleansing: No Wound Bed Granulation Amount: Large (67-100%) Exposed Structure Granulation Quality: Red, Pink, Hyper-granulation Fascia Exposed: No Necrotic Amount: Small (1-33%) Fat Layer Exposed: No Necrotic Quality: Adherent Slough Tendon Exposed: No Muscle Exposed: No Joint Exposed: No Bone Exposed: No Limited to Skin Breakdown Periwound Skin Texture Texture Color No Abnormalities Noted: No No Abnormalities Noted: No Dehart, Misty [name] ([id number]) Callus: No Atrophie Blanche: No Crepitus: No Cyanosis: No Excoriation: No Ecchymosis: No Fluctuance: No Erythema: No Friable: No Hemosiderin Staining: Yes Induration: No Mottled: No Localized Edema: No Pallor: No Rash: No Rubor: No Scarring: Yes Temperature / Pain Moisture Temperature: No Abnormality No Abnormalities Noted: No Tenderness on Palpation: Yes Dry / Scaly: No Maceration: No Moist: Yes Wound Preparation Ulcer Cleansing: Rinsed/Irrigated with Saline Topical Anesthetic Applied: Other: lidocaine 4%, Treatment Notes Wound #2 (Left, Distal Lower Leg) 1. Cleansed with: Clean wound with Normal Saline 2. Anesthetic Topical Lidocaine 4% cream to wound bed prior to debridement 4. Dressing  Applied: Other dressing (specify in notes) Notes Siltec sorbact and iodoform packing gauze to satelite hole on LLE wound Electronic Signature(s) Signed: 08/11/2015 6:09:37 PM By: Kristin Cool, RN, [name], [name] Entered By: Kristin Cool, RN, [name], [name] on  [name] 15:17:39 Kinzie, Kenyette DMarland Kitchen ([id number]) -------------------------------------------------------------------------------- Wound Assessment Details Patient Name: CHANTALE, LEUGERS. Date of Service: [name] 3:00 PM Medical Record Number: [id number] Patient Account Number: [id number] Date of Birth/Sex: 07/01/1946 (68 y.o. Female) Treating RN: [name] Primary Care Physician: [name] Other Clinician: Referring Physician: Glendon [name] Treating Physician/Extender: [name] in Treatment: 27 Wound Status Wound Number: 5 Primary Venous Leg Ulcer Etiology: Wound Location: Right Lower Leg - Posterior Wound Open Wounding Event: Gradually Appeared Status: Date Acquired: 04/29/2015 Comorbid Cataracts, Asthma, Coronary Artery Weeks Of Treatment: 11 History: Disease, Hypertension, Type II Clustered Wound: No Diabetes, Osteoarthritis, Neuropathy Wound Measurements Length: (cm) 2.3 Width: (cm) 2.5 Depth: (cm) 0.2 Area: (cm) 4.516 Volume: (cm) 0.903 % Reduction in Area: -2302.1% % Reduction in Volume: -4652.6% Epithelialization: None Wound Description Full Thickness Without Classification: Exposed Support Structures Diabetic Severity Grade 1 (Wagner): Wound Margin: Distinct, outline attached Exudate Amount: Large Exudate Type: Serous Exudate Color: amber Foul Odor After Cleansing: No Wound Bed Granulation Amount: None Present (0%) Exposed Structure Necrotic Amount: Large (67-100%) Fascia Exposed: No Necrotic Quality: Eschar Fat Layer Exposed: No Tendon Exposed: No Muscle Exposed: No Joint Exposed: No Bone Exposed: No Limited to Skin Breakdown Periwound Skin Texture Texture Color No Abnormalities Noted:  No No Abnormalities Noted: No Leach, Audine [name] ([id number]) Callus: No Atrophie Blanche: No Crepitus: No Cyanosis: No Excoriation: No Ecchymosis: No Fluctuance: No Erythema: No Friable: No Hemosiderin Staining: Yes Induration: No Mottled: No Localized Edema: No Pallor: No Rash: No Rubor: No Scarring: No Temperature / Pain Moisture Temperature: No Abnormality No Abnormalities Noted: No Tenderness on Palpation: Yes Dry / Scaly: No Maceration: No Moist: No Wound Preparation Ulcer Cleansing: Rinsed/Irrigated with Saline Topical Anesthetic Applied: Other: Lidocaine 4% Ointment, Treatment Notes Wound #5 (Right, Posterior Lower Leg) 1. Cleansed with: Clean wound with Normal Saline 2. Anesthetic Topical Lidocaine 4% cream to wound bed prior to debridement 4. Dressing Applied: Medihoney Gel 5. Secondary Dressing Applied Bordered Foam Dressing Electronic Signature(s) Signed: 08/11/2015 6:09:37 PM By: Kristin Cool, RN, [name], [name] Entered By: Kristin Cool, RN, [name], [name] 16:44:38 Bechard, [name] ([id number]) -------------------------------------------------------------------------------- [place] Details Patient Name: ILLEANA, EDICK [name] Date of Service: [name] 3:00 PM Medical Record Number: [id number] Patient Account Number: [id number] Date of Birth/Sex: 03/11/1946 (68 y.o. Female) Treating RN: [name] Primary Care Physician: [name] Other Clinician: Referring Physician: Glendon [name] Treating Physician/Extender: [name] in Treatment: 27 Vital Signs Time Taken: 15:09 Temperature (F): 97.7 Height (in): 65 Pulse (bpm): 68 Weight (lbs): 248 Respiratory Rate (breaths/min): 18 Body Mass Index (BMI): 41.3 Blood Pressure (mmHg): 144/60 Reference Range: 80 - 120 mg / dl Electronic Signature(s) Signed: 08/11/2015 6:09:37 PM By: Kristin Cool, RN, [name], [name] Entered By: Kristin Cool, RN, [name], [name] 15:09:36

## 2015-08-18 ENCOUNTER — Encounter: Payer: PPO | Admitting: Surgery

## 2015-08-18 DIAGNOSIS — L97322 Non-pressure chronic ulcer of left ankle with fat layer exposed: Secondary | ICD-10-CM | POA: Diagnosis not present

## 2015-08-19 NOTE — Progress Notes (Signed)
[name], [name] ([id number]) Visit Report for 08/18/2015 Chief Complaint Document Details Patient Name: [name], [name] 08/18/2015 1:30 Date of Service: PM Medical Record [id number] Number: Patient Account Number: [id number] 01/24/1946 (69 y.o. Treating RN: [name] Date of Birth/Sex: Female) Other Clinician: Primary Care Physician: [facility], Delana Meyer Treating [name] Referring Physician: Glendon Axe Physician/Extender: Weeks in Treatment: 28 Information Obtained from: Patient Chief Complaint Patient presents to the wound care center for a consult due non healing wound 69 year old patient comes with a history of having a ulcer on the left lower extremity for the past 4 weeks. she says she's had swelling of both lower extremities for about a year after she started having prednisone. 02/07/2015 -- her vascular appointments obtained were in the first and third week of June. she is able to go to [place] and we will try and get her some earlier appointments. Other than that nothing [name] has changed in her management. Electronic Signature(s) Signed: 08/18/2015 2:08:07 PM By: [name], [name] Entered By: [name] on 08/18/2015 14:08:07 [name] ([id number]) -------------------------------------------------------------------------------- HPI Details Patient Name: [name], [name] 08/18/2015 1:30 Date of Service: PM Medical Record [id number] Number: Patient Account Number: [id number] 1946-10-13 (69 y.o. Treating RN: [name] Date of Birth/Sex: Female) Other Clinician: Primary Care Physician: [facility], Delana Meyer Treating Mirella Gueye Referring Physician: Glendon Axe Physician/Extender: Weeks in Treatment: 28 History of Present Illness HPI Description: 69 year old patient who is known to have diabetes mellitus type 2, chronic renal insufficiency, coronary artery disease, hypertension, hypercholesterolemia, temporal arteritis and inflammatory  arthritiss also has a history of having a hysterectomy and some orthopedic related surgeries. The ulcer on the left lower extremity started off as a blister and then. Got progressively worse. She does not have any fever or chills and has not had any recent surgical intervention for this. Her last hemoglobin A1c was 10.1 in September 2015. She has been recently put on doxycycline by her PCP. She is now also allergic to doxycycline and was this was changed over to Keflex. 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 She does see a cardiologist and also takes a diuretic. 02/07/2015 her arterial and venous duplex studies to be done have dates been given as the first and third week of June. This is at [facility]. We are going to try and get early appointments at [facility]. other than that nothing has changed in her management. 02/14/2015 -- we have been able to get her an appointment in [facility] on May 20 which is much earlier than her previous ones at [place]. She continues with her prednisone and her sugars are in the range of 150-200. 02/21/2015 We were able to get a vascular lab workup for her today and she is going to be there at 2:00 this afternoon. the swelling of her leg has gone down significantly but she still has some tenderness over the wounds. 02/28/2015 - She has had one of two vascular workups done, and this coming Tuesday has another, at [facility]. She continues to be on steroid medications. She has significant sensitivity in her left lower extremity and has pain suggestive of neuropathic pain and I have asked her to address this with her primary care physician. 03/07/2015 -- The patient saw Dr. Lucky Cowboy for a consultation and he has had her arteries are okay but she has 2 incompetent veins on the  left lower extremity and he is going to set her up  for surgery. Official report is awaited. Addendum: Official reports are now available and on 03/04/2015. She was seen and lower extremity venous duplex exam was done. There was reflux present within the left greater saphenous vein below the knee and also the left small saphenous vein. Arterial duplex showed normal triphasic waveforms throughout the left lower extremity without any significant stenosis. Her ABIs were noncompressible bilaterally but a waveforms were normal and a digital pressures were normal bilaterally consistent with no significant arterial insufficiency. He has recommended endovenous ablation of both the left small saphenous and the left great saphenous vein. This would still be scheduled later. 03/14/2015 -- she has heard back from the vascular office and has surgery scheduled for sometime in July. [name] ([id number]) Her rheumatologist has decreased her prednisone dosage but she still on it. She has also had cataract surgery in her right eye recently this week. 03/20/2015 - No new complaints today. Pain improved. No fever or chills. Tolerating 2 layer compression. 04/14/2015 -- she was doing very well today she went off on vacation and now her edema has increased markedly the ulceration is bigger and her diabetes is not under control. 04/21/2015 -- I spoke to her PCP Dr. Candiss Norse and discussed the management which would include being seen by a general surgeon for debridement and taking multiple punch biopsies which would help in establishing the diagnosis of this is a vasculitis. She is agreeable about this and will set her up for the procedure with Dr. Tamala Julian at [facility]. She was seen by the surgeon Dr. Jamal Collin. 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 She is scheduled to have laser ablation done next week.Pt was sent here for possible biopsy to  look for vasculitis. Feel it would be better to wait after laser ablation is completed- the ulcer may heal fully and biopsy may not be necessary 04/29/2015 -- she had the venous ablation done by Dr. Lucky Cowboy last Friday and we do not have any notes yet. She is doing fine otherwise. 05/06/2015 --Review of her recent vascular intervention shows that she was seen by Dr. Lucky Cowboy on 04/29/2015. The follow-up duplex which was done showed that both the great saphenous vein and the small saphenous vein remained patent with reflux consistent with an unsuccessful ablation. He has rescheduled her for another the endovenous ablation to be done in about 4 weekso time. 05/13/2015 -- he was seen by her surgeon Dr. Jamal Collin who asked her to continue with conservative therapy and he would speak to Dr. Lucky Cowboy about her management. Dr. Lucky Cowboy is going to schedule her surgery in the middle of August for a repeat endovenous ablation. Her pus culture from last week has grown : [place] her noted her sensitivity report but due to her multiple allergies I had tried clindamycin and she developed a rash with this too. She has been prescribed and anti-buttocks in the ER and is has it at home and she will let is know what she is going to be taking. 05/20/2015 -- she has developed a small spot on her right lower extremity but besides that it is not a full fledged ulceration. She did not get to see Dr. Lucky Cowboy last week and hopefully she will see him in the near future. 05/27/2015 -she is still awaiting her  appointment with Dr.Dew and her vascular procedure is not scheduled until August 19. She will be seeing her PCP tomorrow and I have asked her to convey our discussion so that she is aware that debridement has not been done yet. 06/03/2015 -- was seen by her rheumatologist Dr. Dorthula Matas, who has been treating her for temporal  arteritis and in his note has mentioned the possibility of vasculitis or pyoderma gangrenosum. He is lowering her prednisone to 12-1/2 mg for 1 month and then 10 mg per the next month. I will again make an attempt to speak to her PCP Dr. Candiss Norse and her surgeon Dr. 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 06/17/2015 -- Dr.Dew did her vascular procedure last week and a follow-up venous ultrasound shows good resolution of the veins as per the patient's history. He is to see her back in 2 weeks. [name], [name] ([id number]) 07/07/2015. -- the patient has had a heavy growth of Proteus mirabilis and Enterococcus faecalis. These are sensitive to several drugs but the problem is she has allergies to all of these and hence I would like her to see Dr. Ola Spurr for this. She is also due to see Dr. Lucky Cowboy tomorrow and I will discussed the management with him including debridement under anesthesia and possible biopsies. 07/14/2015 -- she has an appointment to see Dr. Ola Spurr tomorrow and did see Dr. Bunnie Domino PA who will discuss my request with him. 07/21/2015 -- saw Dr. Ola Spurr was able to do a test on her and has put her on amoxicillin. She has been tolerating that and has had no problems with allergies to this. 07/28/2015 -- Last Friday I spoke to Dr. Leotis Pain regarding her care and he said that her right leg did not need any surgery and on the left leg was doing pretty good. We did agree that if she undergoes any procedure in the future he would do a couple of punch biopsies of the wound. 08/18/2015 -- her right leg is very tender and there is significant amount of slough. The left leg is looking much better Electronic Signature(s) Signed: 08/18/2015 2:08:38 PM By: [name], [name] Entered By: [name] on 08/18/2015 14:08:38 [name], [name]  ([id number]) -------------------------------------------------------------------------------- Physical Exam Details Patient Name: [name], [name] 08/18/2015 1:30 Date of Service: PM Medical Record [id number] Number: Patient Account Number: [id number] 29-Jan-1946 (69 y.o. Treating RN: [name] Date of Birth/Sex: Female) Other Clinician: Primary Care Physician: [facility], Delana Meyer Treating [name] Referring Physician: Glendon Axe Physician/Extender: Weeks in Treatment: 28 Constitutional . Pulse regular. Respirations normal and unlabored. Afebrile. . Eyes Nonicteric. Reactive to light. Ears, Nose, Mouth, and Throat Lips, teeth, and gums WNL.Marland Kitchen Moist mucosa without lesions . Neck supple and nontender. No palpable supraclavicular or cervical adenopathy. Normal sized without goiter. Respiratory WNL. No retractions.. Cardiovascular Pedal Pulses WNL. No clubbing, cyanosis or edema. Chest Breasts symmetical and no nipple discharge.. Breast tissue WNL, no masses, lumps, or tenderness.. Lymphatic No adneopathy. No adenopathy. No adenopathy. Musculoskeletal Adexa without tenderness or enlargement.. Digits and nails w/o clubbing, cyanosis, infection, petechiae, ischemia, or inflammatory conditions.. Integumentary (Hair, Skin) No suspicious lesions. No crepitus or fluctuance. No peri-wound warmth or erythema. No masses.Marland Kitchen Psychiatric Judgement and insight Intact.. No evidence of depression, anxiety, or agitation.. Notes The left lower extremity ulceration is much cleaner and has good granulation tissue. The right posterior lateral ulcer is extremely  tender, has a lot of slough and has significant increase in size. Electronic Signature(s) Signed: 08/18/2015 2:09:35 PM By: [name], [name] Entered By: [name] on 08/18/2015 14:09:35 CHASSITY, LUDKE ([id number]) -------------------------------------------------------------------------------- Physician Orders  Details Patient Name: SHAYA, REDDICK D. 08/18/2015 1:30 Date of Service: PM Medical Record [id number] Number: Patient Account Number: [id number] [birth year] (69 y.o. Treating RN: [name] Date of Birth/Sex: Female) Other Clinician: Primary Care Physician: [facility], Delana Meyer Treating [name] Referring Physician: Glendon Axe Physician/Extender: Weeks in Treatment: 45 Verbal / Phone Orders: Yes Clinician: [name] Read Back and Verified: Yes Diagnosis Coding Wound Cleansing Wound #2 Left,Distal Lower Leg o Clean wound with Normal Saline. Wound #5 Right,Posterior Lower Leg o Clean wound with Normal Saline. Anesthetic Wound #2 Left,Distal Lower Leg o Topical Lidocaine 4% cream applied to wound bed prior to debridement Wound #5 Right,Posterior Lower Leg o Topical Lidocaine 4% cream applied to wound bed prior to debridement Primary Wound Dressing Wound #2 Left,Distal Lower Leg o Iodoform packing Gauze - packed into proximal satellite area o Other: - siltec sorbact Wound #5 Right,Posterior Lower Leg o Santyl Ointment Secondary Dressing Wound #5 Right,Posterior Lower Leg o Boardered Foam Dressing Dressing Change Frequency Wound #2 Left,Distal Lower Leg o Change dressing every other day. - daily if necessary due to drainage Wound #5 Right,Posterior Lower Leg o Change dressing every other day. - daily if necessary due to drainage Follow-up Appointments [name], CAUSEY. ([id number]) Wound #2 Left,Distal Lower Leg o Return Appointment in 1 week. Wound #5 Right,Posterior Lower Leg o Return Appointment in 1 week. Edema Control Wound #2 Left,Distal Lower Leg o Support Garment 20-30 mm/Hg pressure to: Wound #5 Right,Posterior Lower Leg o Support Garment 20-30 mm/Hg pressure to: Additional Orders / Instructions Wound #2 Left,Distal Lower Leg o Increase protein intake. Wound #5 Right,Posterior Lower Leg o Increase protein  intake. Medications-please add to medication list. Wound #2 Left,Distal Lower Leg o P.O. Antibiotics - Complete antibiotics prescribed by Dr. Ola Spurr Wound #5 Right,Posterior Lower Leg o P.O. Antibiotics - Complete antibiotics prescribed by Dr. Ola Spurr Electronic Signature(s) Signed: 08/18/2015 2:14:21 PM By: [name], [name] Signed: 08/18/2015 5:24:55 PM By: [name] Entered By: [name] on 08/18/2015 14:05:40 Pekar, SHERYLL DYMEK ([id number]) -------------------------------------------------------------------------------- Problem List Details Patient Name: [name], [name] 08/18/2015 1:30 Date of Service: PM Medical Record [id number] Number: Patient Account Number: [id number] [birth year] (69 y.o. 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 ([id number]) [name], [name] ([id number]) -------------------------------------------------------------------------------- Progress Note Details Patient Name: [name], Kristin  D. 08/18/2015 1:30 Date of Service: PM Medical Record [id number] Number: Patient Account Number: [id number] 30-Apr-1946 (69 y.o. Treating RN: [name] Date of Birth/Sex: Female) Other Clinician: Primary Care Physician: [facility] [place] Treating  [name] Referring Physician: Glendon Axe Physician/Extender: [place] in Treatment: 28 Subjective Chief Complaint Information obtained from Patient Patient presents to the wound care center for a consult due non healing wound 69 year old patient comes with a history of having a ulcer on the left lower extremity for the past 4 weeks. she says she's had swelling of both lower extremities for about a year after she started having prednisone. 02/07/2015 -- her vascular appointments obtained were in the first and third week of June. she is able to go to [place] and we will try and get her some earlier appointments. Other than that nothing [name] has changed in her management. History of Present Illness (HPI) 69 year old patient who is known to have diabetes mellitus type 2, chronic renal insufficiency, coronary artery disease, hypertension, hypercholesterolemia, temporal arteritis and inflammatory arthritiss also has a history of having a hysterectomy and some orthopedic related surgeries. The ulcer on the left lower extremity started off as a blister and then. Got progressively worse. She does not have any fever or chills and has not had any recent surgical intervention for this. Her last hemoglobin A1c was 10.1 in September 2015. She has been recently put on doxycycline by her PCP. She is now also allergic to doxycycline and was this was changed over to Keflex. 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 She does see a cardiologist and also takes a diuretic. 02/07/2015 her arterial and venous duplex studies to be done have dates been  given as the first and third week of June. This is at [facility]. We are going to try and get early appointments at [facility]. other than that nothing has changed in her management. 02/14/2015 -- we have been able to get her an appointment in [facility] on May 20 which is much earlier than her previous ones at [place]. She continues with her prednisone and her sugars are in the range of 150-200. 02/21/2015 We were able to get a vascular lab workup for her today and she is going to be there at 2:00 this afternoon. the swelling of her leg has gone down significantly but she still has some tenderness over the wounds. 02/28/2015 - She has had one of two vascular workups done, and this coming Tuesday has another, at [facility]. She continues to be on steroid medications. She has significant sensitivity in her left lower extremity and has pain suggestive of neuropathic pain and I have asked her to address this with her primary care physician. 03/07/2015 -- The patient saw Dr. Lucky Cowboy for a consultation and he has had her arteries are okay but she has 2 incompetent veins on the left lower extremity and he is going to set her up for surgery. Official report is [name], [name] ([id number]) awaited. Addendum: Official reports are now available and on 03/04/2015. She was seen and lower extremity venous duplex exam was done. There was reflux present within the left greater saphenous vein below the knee and also the left small saphenous vein. Arterial duplex showed normal triphasic waveforms throughout the left lower extremity without any significant stenosis. Her ABIs were noncompressible bilaterally but a waveforms were normal and a digital pressures were normal bilaterally consistent with no significant arterial insufficiency. He has recommended endovenous ablation of both the left small saphenous and the left great saphenous vein. This would still be scheduled  later. 03/14/2015 --  she has heard back from the vascular office and has surgery scheduled for sometime in July. Her rheumatologist has decreased her prednisone dosage but she still on it. She has also had cataract surgery in her right eye recently this week. 03/20/2015 - No new complaints today. Pain improved. No fever or chills. Tolerating 2 layer compression. 04/14/2015 -- she was doing very well today she went off on vacation and now her edema has increased markedly the ulceration is bigger and her diabetes is not under control. 04/21/2015 -- I spoke to her PCP Dr. Candiss Norse and discussed the management which would include being seen by a general surgeon for debridement and taking multiple punch biopsies which would help in establishing the diagnosis of this is a vasculitis. She is agreeable about this and will set her up for the procedure with Dr. Tamala Julian at [facility]. She was seen by the surgeon Dr. Jamal Collin. 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 She is scheduled to have laser ablation done next week.Pt was sent here for possible biopsy to look for vasculitis. Feel it would be better to wait after laser ablation is completed- the ulcer may heal fully and biopsy may not be necessary 04/29/2015 -- she had the venous ablation done by Dr. Lucky Cowboy last Friday and we do not have any notes yet. She is doing fine otherwise. 05/06/2015 --Review of her recent vascular intervention shows that she was seen by Dr. Lucky Cowboy on 04/29/2015. The follow-up duplex which was done showed that both the great saphenous vein and the small saphenous vein remained patent with reflux consistent with an unsuccessful ablation. He has rescheduled her for another the endovenous ablation to be done in about 4 weeks time. 05/13/2015 -- he was seen by her surgeon Dr. Jamal Collin who asked her to continue with  conservative therapy and he would speak to Dr. Lucky Cowboy about her management. Dr. Lucky Cowboy is going to schedule her surgery in the middle of August for a repeat endovenous ablation. Her pus culture from last week has grown : [place] her noted her sensitivity report but due to her multiple allergies I had tried clindamycin and she developed a rash with this too. She has been prescribed and anti-buttocks in the ER and is has it at home and she will let is know what she is going to be taking. 05/20/2015 -- she has developed a small spot on her right lower extremity but besides that it is not a full fledged ulceration. She did not get to see Dr. Lucky Cowboy last week and hopefully she will see him in the near future. 05/27/2015 -she is still awaiting her appointment with Dr.Dew and her vascular procedure is not scheduled until August 19. She will be seeing her PCP tomorrow and I have asked her to convey our discussion so that she is aware that debridement has not been done yet. ANZLEY, DIBBERN ([id number]) 06/03/2015 -- was seen by her rheumatologist Dr. 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 He is lowering her prednisone to 12-1/2 mg for 1 month and then 10 mg per the next month. I will again make an attempt to speak to her PCP Dr. Candiss Norse and her surgeon Dr. Lucky Cowboy to see if he can organize for a debridement in the  OR with multiple biopsies to establish a diagnosis of vasculitis or pyoderma gangrenosum. 06/17/2015 -- Dr.Dew did her vascular procedure last week and a follow-up venous ultrasound shows good resolution of the veins as per the patient's history. He is to see her back in 2 weeks. 07/07/2015. -- the patient has had a heavy growth of Proteus mirabilis and Enterococcus faecalis. These are  sensitive to several drugs but the problem is she has allergies to all of these and hence I would like her to see Dr. Ola Spurr for this. She is also due to see Dr. Lucky Cowboy tomorrow and I will discussed the management with him including debridement under anesthesia and possible biopsies. 07/14/2015 -- she has an appointment to see Dr. Ola Spurr tomorrow and did see Dr. Bunnie Domino PA who will discuss my request with him. 07/21/2015 -- saw Dr. Ola Spurr was able to do a test on her and has put her on amoxicillin. She has been tolerating that and has had no problems with allergies to this. 07/28/2015 -- Last Friday I spoke to Dr. Leotis Pain regarding her care and he said that her right leg did not need any surgery and on the left leg was doing pretty good. We did agree that if she undergoes any procedure in the future he would do a couple of punch biopsies of the wound. 08/18/2015 -- her right leg is very tender and there is significant amount of slough. The left leg is looking much better Objective Constitutional Pulse regular. Respirations normal and unlabored. Afebrile. Vitals Time Taken: 1:49 PM, Height: 65 in, Weight: 248 lbs, BMI: 41.3, Temperature: 97.9 F, Pulse: 66 bpm, Respiratory Rate: 18 breaths/min, Blood Pressure: 133/61 mmHg. Eyes Nonicteric. Reactive to light. Ears, Nose, Mouth, and Throat Lips, teeth, and gums WNL.Marland Kitchen Moist mucosa without lesions . Neck supple and nontender. No palpable supraclavicular or cervical adenopathy. Normal sized without goiter. Respiratory Calles, Amaal D. ([id number]) WNL. No retractions.. Cardiovascular Pedal Pulses WNL. No clubbing, cyanosis or edema. Chest Breasts symmetical and no nipple discharge.. Breast tissue WNL, no masses, lumps, or tenderness.. Lymphatic No adneopathy. No adenopathy. No adenopathy. Musculoskeletal Adexa without tenderness or enlargement.. Digits and nails w/o clubbing, cyanosis, infection, petechiae, ischemia, or  inflammatory conditions.Marland Kitchen Psychiatric Judgement and insight Intact.. No evidence of depression, anxiety, or agitation.. General Notes: The left lower extremity ulceration is much cleaner and has good granulation tissue. The right posterior lateral ulcer is extremely tender, has a lot of slough and has significant increase in size. Integumentary (Hair, Skin) No suspicious lesions. No crepitus or fluctuance. No peri-wound warmth or erythema. No masses.. Wound #2 status is Open. Original cause of wound was Blister. The wound is located on the Left,Distal Lower Leg. The wound measures 5.8cm length x 4.6cm width x 0.3cm depth; 20.954cm^2 area and 6.286cm^3 volume. The wound is limited to skin breakdown. There is no tunneling or undermining noted. There is a large amount of serous drainage noted. The wound margin is distinct with the outline attached to the wound base. There is large (67-100%) red, pink granulation within the wound bed. There is a small (1-33%) amount of necrotic tissue within the wound bed including Adherent Slough. The periwound skin appearance exhibited: Scarring, Moist, Hemosiderin Staining. The periwound skin appearance did not exhibit: Callus, Crepitus, Excoriation, Fluctuance, Friable, Induration, Localized Edema, Rash, Dry/Scaly, Maceration, Atrophie Blanche, Cyanosis, Ecchymosis, Mottled, Pallor, Rubor, Erythema. Periwound temperature was noted as No Abnormality. The periwound has tenderness on palpation. Wound #5 status is Open. Original cause of wound was  Gradually Appeared. The wound is located on the Right,Posterior Lower Leg. The wound measures 3.1cm length x 4.6cm width x 0.3cm depth; 11.2cm^2 area and 3.36cm^3 volume. The wound is limited to skin breakdown. There is no tunneling or undermining noted. There is a large amount of serous drainage noted. The wound margin is distinct with the outline attached to the wound base. There is no granulation within the wound bed.  There is a large (67-100%) amount of necrotic tissue within the wound bed including Eschar. The periwound skin appearance exhibited: Hemosiderin Staining. The periwound skin appearance did not exhibit: Callus, Crepitus, Excoriation, Fluctuance, Friable, Induration, Localized Edema, Rash, Scarring, Dry/Scaly, Maceration, Moist, Atrophie Blanche, Cyanosis, Ecchymosis, Mottled, Pallor, Rubor, Erythema. Periwound temperature was noted as No Abnormality. The periwound has tenderness on palpation. 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 Wound #5 Right,Posterior Lower Leg: Clean wound with Normal Saline. Anesthetic: Wound #2 Left,Distal Lower Leg: Topical Lidocaine 4% cream applied to wound bed prior to debridement Wound #5 Right,Posterior Lower Leg: Topical Lidocaine 4% cream applied to wound bed prior to debridement Primary Wound Dressing: Wound #2 Left,Distal Lower Leg: Iodoform packing Gauze - packed into proximal satellite area Other: - siltec sorbact Wound #5 Right,Posterior Lower Leg: Santyl Ointment Secondary Dressing: Wound #5 Right,Posterior Lower Leg: Boardered Foam Dressing Dressing Change Frequency: Wound #2 Left,Distal Lower Leg: Change dressing every other day. - daily if necessary due to drainage Wound #5 Right,Posterior Lower Leg: Change dressing every other day. - daily if necessary due to drainage Follow-up  Appointments: Wound #2 Left,Distal Lower Leg: Return Appointment in 1 week. Wound #5 Right,Posterior Lower Leg: Return Appointment in 1 week. Edema Control: Wound #2 Left,Distal Lower Leg: Glennon, Aarin D. ([id number]) Support Garment 20-30 mm/Hg pressure to: Wound #5 Right,Posterior Lower Leg: Support Garment 20-30 mm/Hg pressure to: Additional Orders / Instructions: Wound #2 Left,Distal Lower Leg: Increase protein intake. Wound #5 Right,Posterior Lower Leg: Increase protein intake. Medications-please add to medication list.: Wound #2 Left,Distal Lower Leg: P.O. Antibiotics - Complete antibiotics prescribed by Dr. Ola Spurr Wound #5 Right,Posterior Lower Leg: P.O. Antibiotics - Complete antibiotics prescribed by Dr. Ola Spurr For the right lower extremity have recommended we go back to [facility] to be applied daily. For the left lower extremity I would use Siltec Sorbact to be applied on alternate days. She will continue to use her compression stockings as before. She would benefit from a biopsy to be done under general anesthesia and I have discussed this with her. Electronic Signature(s) Signed: 08/18/2015 2:11:59 PM By: [name], [name] Entered By: [name] on 08/18/2015 14:11:57 Binning, Defne D. ([id number]) -------------------------------------------------------------------------------- SuperBill Details Patient Name: BETTA, BALLA D. Date of Service: 08/18/2015 Medical Record Number: [id number] Patient Account Number: [id number] Date of Birth/Sex: July 20, 1946 (68 y.o. 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 L03.116 Cellulitis of left lower limb Facility Procedures CPT4 Code: [id number] Description: 99214 - WOUND CARE VISIT-LEV 4 EST PT Modifier: Quantity: 1 Physician Procedures CPT4 Code: [id number] Description: 44010 - WC PHYS LEVEL 3 - EST PT ICD-10 Description Diagnosis E11.622 Type 2 diabetes mellitus with other skin ulcer L97.322 Non-pressure chronic ulcer of left ankle with fat E66.01 Morbid (severe) obesity due to excess calories Modifier: layer exposed Quantity: 1 Electronic Signature(s) Signed: 08/18/2015 2:13:45 PM By: [name], [name] Entered By: [name] on 08/18/2015 14:13:45

## 2015-08-19 NOTE — Progress Notes (Signed)
[name], LAFALCE ([id number]) Visit Report for 08/18/2015 Arrival Information Details Patient Name: MARCHEL, [name] Date of Service: 08/18/2015 1:30 PM Medical Record Number: [id number] Patient Account Number: [id number] Date of Birth/Sex: January 31, 1946 (68 y.o. Female) Treating RN: [name] Primary Care Physician: Glendon Axe Other Clinician: Referring Physician: Glendon Axe Treating Physician/Extender: Frann Rider in Treatment: 28 Visit Information History Since Last Visit Added or deleted any medications: No Patient Arrived: Cane Any new allergies or adverse reactions: No Arrival Time: 13:46 Had a fall or experienced change in No Accompanied By: self activities of daily living that may affect Transfer Assistance: None risk of falls: Patient Identification Verified: Yes Signs or symptoms of abuse/neglect since last No Secondary Verification Process Yes visito Completed: Hospitalized since last visit: No Patient Has Alerts: Yes Pain Present Now: No Patient Alerts: Patient on Blood Thinner Electronic Signature(s) Signed: 08/18/2015 5:24:55 PM By: [name] Entered By: [name] on 08/18/2015 13:49:00 [name], [name] ([id number]) -------------------------------------------------------------------------------- Clinic Level of Care Assessment Details Patient Name: [name] D. Date of Service: 08/18/2015 1:30 PM Medical Record Number: [id number] Patient Account Number: [id number] Date of Birth/Sex: [birth year] (68 y.o. Female) Treating RN: [name] Primary Care Physician: Glendon Axe Other Clinician: Referring Physician: Glendon Axe Treating Physician/Extender: Frann Rider in Treatment: 28 Clinic Level of Care Assessment Items TOOL 4 Quantity Score []  - Use when only an EandM is performed on FOLLOW-UP visit 0 ASSESSMENTS - Nursing Assessment / Reassessment X - Reassessment of Co-morbidities (includes updates in patient  status) 1 10 X - Reassessment of Adherence to Treatment Plan 1 5 ASSESSMENTS - Wound and Skin Assessment / Reassessment []  - Simple Wound Assessment / Reassessment - one wound 0 X - Complex Wound Assessment / Reassessment - multiple wounds 2 5 []  - Dermatologic / Skin Assessment (not related to wound area) 0 ASSESSMENTS - Focused Assessment X - Circumferential Edema Measurements - multi extremities 2 5 []  - Nutritional Assessment / Counseling / Intervention 0 X - Lower Extremity Assessment (monofilament, tuning fork, pulses) 1 5 []  - Peripheral Arterial Disease Assessment (using hand held doppler) 0 ASSESSMENTS - Ostomy and/or Continence Assessment and Care []  - Incontinence Assessment and Management 0 []  - Ostomy Care Assessment and Management (repouching, etc.) 0 PROCESS - Coordination of Care X - Simple Patient / Family Education for ongoing care 1 15 []  - Complex (extensive) Patient / Family Education for ongoing care 0 []  - Staff obtains Programmer, systems, Records, Test Results / Process Orders 0 []  - Staff telephones HHA, Nursing Homes / Clarify orders / etc 0 []  - Routine Transfer to another Facility (non-emergent condition) 0 [name], [name] ([id number]) []  - Routine Hospital Admission (non-emergent condition) 0 []  - New Admissions / Biomedical engineer / Ordering NPWT, Apligraf, etc. 0 []  - Emergency Hospital Admission (emergent condition) 0 X - Simple Discharge Coordination 1 10 []  - Complex (extensive) Discharge Coordination 0 PROCESS - Special Needs []  - Pediatric / Minor Patient Management 0 []  - Isolation Patient Management 0 []  - Hearing / Language / Visual special needs 0 []  - Assessment of Community assistance (transportation, D/C planning, etc.) 0 []  - Additional assistance / Altered mentation 0 []  - Support Surface(s) Assessment (bed, cushion, seat, etc.) 0 INTERVENTIONS - Wound Cleansing / Measurement []  - Simple Wound Cleansing - one wound 0 X - Complex Wound  Cleansing - multiple wounds 2 5 X - Wound Imaging (photographs - any number of wounds) 1 5 []  - Wound Tracing (instead of photographs)  0 []  - Simple Wound Measurement - one wound 0 X - Complex Wound Measurement - multiple wounds 2 5 INTERVENTIONS - Wound Dressings X - Small Wound Dressing one or multiple wounds 2 10 []  - Medium Wound Dressing one or multiple wounds 0 []  - Large Wound Dressing one or multiple wounds 0 X - Application of Medications - topical 1 5 []  - Application of Medications - injection 0 INTERVENTIONS - Miscellaneous []  - External ear exam 0 [name], [name] ([id number]) []  - Specimen Collection (cultures, biopsies, blood, body fluids, etc.) 0 []  - Specimen(s) / Culture(s) sent or taken to Lab for analysis 0 []  - Patient Transfer (multiple staff / [name]) 0 []  - Simple Staple / Suture removal (25 or less) 0 []  - Complex Staple / Suture removal (26 or more) 0 []  - Hypo / Hyperglycemic Management (close monitor of Blood Glucose) 0 []  - Ankle / Brachial Index (ABI) - do not check if billed separately 0 X - Vital Signs 1 5 Has the patient been seen at the hospital within the last three years: Yes Total Score: 120 Level Of Care: New/Established - Level 4 Electronic Signature(s) Signed: 08/18/2015 5:24:55 PM By: [name] Entered By: [name] on 08/18/2015 14:06:25 [name], [name] ([id number]) -------------------------------------------------------------------------------- Encounter Discharge Information Details Patient Name: [name] D. Date of Service: 08/18/2015 1:30 PM Medical Record Number: [id number] Patient Account Number: [id number] Date of Birth/Sex: August 01, 1946 (68 y.o. Female) Treating RN: [name] Primary Care Physician: Glendon Axe Other Clinician: Referring Physician: Glendon Axe Treating Physician/Extender: Frann Rider in Treatment: 28 Encounter Discharge Information Items Discharge Pain Level:  0 Discharge Condition: Stable Ambulatory Status: Cane Discharge Destination: Home Transportation: Private Auto Accompanied By: self Schedule Follow-up Appointment: Yes Medication Reconciliation completed and provided to Patient/Care No Tyre Beaver: Provided on Clinical Summary of Care: 08/18/2015 Form Type Recipient Paper Patient LM Electronic Signature(s) Signed: 08/18/2015 2:20:03 PM By: Ruthine Dose Entered By: Ruthine Dose on 08/18/2015 14:20:00 Dicola, [name] ([id number]) -------------------------------------------------------------------------------- Lower Extremity Assessment Details Patient Name: [name] D. Date of Service: 08/18/2015 1:30 PM Medical Record Number: [id number] Patient Account Number: [id number] Date of Birth/Sex: 1946-05-25 (68 y.o. Female) Treating RN: [name] Primary Care Physician: Glendon Axe Other Clinician: Referring Physician: Glendon Axe Treating Physician/Extender: Frann Rider in Treatment: 28 Edema Assessment Assessed: [Left: No] [Right: No] Edema: [Left: Yes] [Right: Yes] Calf Left: Right: Point of Measurement: 30 cm From Medial Instep cm cm Ankle Left: Right: Point of Measurement: 9 cm From Medial Instep 24.5 cm 25 cm Vascular Assessment Pulses: Posterior Tibial Dorsalis Pedis Palpable: [Left:Yes] [Right:Yes] Extremity colors, hair growth, and conditions: Extremity Color: [Left:Normal] [Right:Normal] Hair Growth on Extremity: [Left:No] [Right:No] Temperature of Extremity: [Left:Warm] [Right:Warm] Capillary Refill: [Left:< 3 seconds] [Right:< 3 seconds] Electronic Signature(s) Signed: 08/18/2015 5:24:55 PM By: [name] Entered By: [name] on 08/18/2015 13:59:06 Cleere, [name] ([id number]) -------------------------------------------------------------------------------- Multi Wound Chart Details Patient Name: [name] D. Date of Service: 08/18/2015 1:30 PM Medical Record Number:  [id number] Patient Account Number: [id number] Date of Birth/Sex: 02-01-1946 (68 y.o. Female) Treating RN: [name] Primary Care Physician: Glendon Axe Other Clinician: Referring Physician: Glendon Axe Treating Physician/Extender: Frann Rider in Treatment: 28 Vital Signs Height(in): 65 Pulse(bpm): 66 Weight(lbs): 248 Blood Pressure 133/61 (mmHg): Body Mass Index(BMI): 41 Temperature(F): 97.9 Respiratory Rate 18 (breaths/min): Photos: [2:No Photos] [5:No Photos] [N/A:N/A] Wound Location: [2:Left Lower Leg - Distal Right Lower Leg -] [5:Posterior] [N/A:N/A] Wounding Event: [2:Blister] [5:Gradually Appeared] [N/A:N/A]  Primary Etiology: [2:Venous Leg Ulcer] [5:Venous Leg Ulcer] [N/A:N/A] Comorbid History: [2:Cataracts, Asthma, Coronary Artery Disease, Coronary Artery Disease, Hypertension, Type II Diabetes, Osteoarthritis, Diabetes, Osteoarthritis, Neuropathy] [5:Cataracts, Asthma, Hypertension, Type II Neuropathy] [N/A:N/A] Date Acquired: [2:12/30/2014] [5:04/29/2015] [N/A:N/A] Weeks of Treatment: [2:28] [5:12] [N/A:N/A] Wound Status: [2:Open] [5:Open] [N/A:N/A] Clustered Wound: [2:Yes] [5:No] [N/A:N/A] Measurements L x W x D 5.8x4.6x0.3 [5:3.1x4.6x0.3] [N/A:N/A] (cm) Area (cm) : [2:20.954] [5:11.2] [N/A:N/A] Volume (cm) : [2:6.286] [5:3.36] [N/A:N/A] % Reduction in Area: [2:-2863.80%] [5:-5857.40%] [N/A:N/A] % Reduction in Volume: -8753.50% [5:-17584.20%] [N/A:N/A] Classification: [2:Full Thickness Without Exposed Support Structures] [5:Full Thickness Without Exposed Support Structures] [N/A:N/A] HBO Classification: [2:Grade 1] [5:Grade 1] [N/A:N/A] Exudate Amount: [2:Large] [5:Large] [N/A:N/A] Exudate Type: [2:Serous] [5:Serous] [N/A:N/A] Exudate Color: [2:amber] [5:amber] [N/A:N/A] Wound Margin: [2:Distinct, outline attached Distinct, outline attached] [N/A:N/A] Granulation Amount: [2:Large (67-100%)] [5:None Present (0%)] [N/A:N/A] Granulation Quality:  [5:N/A] [N/A:N/A] Red, Pink, Hyper- granulation Necrotic Amount: Small (1-33%) Large (67-100%) N/A Necrotic Tissue: Adherent Slough Eschar N/A Exposed Structures: Fascia: No Fascia: No N/A Fat: No Fat: No Tendon: No Tendon: No Muscle: No Muscle: No Joint: No Joint: No Bone: No Bone: No Limited to Skin Limited to Skin Breakdown Breakdown Epithelialization: Small (1-33%) None N/A Periwound Skin Texture: Scarring: Yes Edema: No N/A Edema: No Excoriation: No Excoriation: No Induration: No Induration: No Callus: No Callus: No Crepitus: No Crepitus: No Fluctuance: No Fluctuance: No Friable: No Friable: No Rash: No Rash: No Scarring: No Periwound Skin Moist: Yes Maceration: No N/A Moisture: Maceration: No Moist: No Dry/Scaly: No Dry/Scaly: No Periwound Skin Color: Hemosiderin Staining: Yes Hemosiderin Staining: Yes N/A Atrophie Blanche: No Atrophie Blanche: No Cyanosis: No Cyanosis: No Ecchymosis: No Ecchymosis: No Erythema: No Erythema: No Mottled: No Mottled: No Pallor: No Pallor: No Rubor: No Rubor: No Temperature: No Abnormality No Abnormality N/A Tenderness on Yes Yes N/A Palpation: Wound Preparation: Ulcer Cleansing: Ulcer Cleansing: N/A Rinsed/Irrigated with Rinsed/Irrigated with Saline Saline Topical Anesthetic Topical Anesthetic Applied: Other: lidocaine Applied: Other: Lidocaine 4% 4% Ointment Treatment Notes Electronic Signature(s) Signed: 08/18/2015 5:24:55 PM By: [name] Entered By: [name] on 08/18/2015 14:01:09 Teston, [name] ([id number]) [place], Tea. ([id number]) -------------------------------------------------------------------------------- [place] Details Patient Name: LYCIA, SACHDEVA D. Date of Service: 08/18/2015 1:30 PM Medical Record Number: [id number] Patient Account Number: [id number] Date of Birth/Sex: 1946/07/21 (68 y.o. Female) Treating RN: [name] Primary Care Physician:  Glendon Axe Other Clinician: Referring Physician: Glendon Axe Treating Physician/Extender: Frann Rider in Treatment: 2 Active Inactive Orientation to the Wound Care Program Nursing Diagnoses: Knowledge deficit related to the wound healing center program Goals: Patient/caregiver will verbalize understanding of the [place] Program Date Initiated: 01/31/2015 Goal Status: Active Interventions: Provide education on orientation to the wound center Notes: Venous Leg Ulcer Nursing Diagnoses: Potential for venous Insuffiency (use before diagnosis confirmed) Goals: Non-invasive venous studies are completed as ordered Date Initiated: 01/31/2015 Goal Status: Active Patient/caregiver will verbalize understanding of disease process and disease management Date Initiated: 01/31/2015 Goal Status: Active Interventions: Assess peripheral edema status every visit. Notes: Wound/Skin Impairment Nursing Diagnoses: Impaired tissue integrity Knowledge deficit related to smoking impact on wound healing Geiger, Dniya D. ([id number]) Goals: Patient/caregiver will verbalize understanding of skin care regimen Date Initiated: 01/31/2015 Goal Status: Active Ulcer/skin breakdown will heal within 14 weeks Date Initiated: 01/31/2015 Goal Status: Active Interventions: Assess ulceration(s) every visit Notes: Electronic Signature(s) Signed: 08/18/2015 5:24:55 PM By: [name] Entered By: [name] on 08/18/2015 13:59:17 Mortimer, [name] ([id number]) -------------------------------------------------------------------------------- Patient/Caregiver Education Details Patient Name: [name]  D. Date of Service: 08/18/2015 1:30 PM Medical Record Number: [id number] Patient Account Number: [id number] Date of Birth/Gender: 1945/11/18 (68 y.o. Female) Treating RN: [name] Primary Care Physician: Glendon Axe Other Clinician: Referring Physician: Glendon Axe Treating  Physician/Extender: Frann Rider in Treatment: 28 Education Assessment Education Provided To: Patient Education Topics Provided Wound/Skin Impairment: Handouts: Other: wound care as ordered Methods: Demonstration, Explain/Verbal Responses: State content correctly Electronic Signature(s) Signed: 08/18/2015 5:24:55 PM By: [name] Entered By: [name] on 08/18/2015 14:19:01 Sandin, [name] ([id number]) -------------------------------------------------------------------------------- Wound Assessment Details Patient Name: Grawe, [name] Date of Service: 08/18/2015 1:30 PM Medical Record Number: [id number] Patient Account Number: [id number] Date of Birth/Sex: 1946-05-05 (68 y.o. Female) Treating RN: [name] Primary Care Physician: Glendon Axe Other Clinician: Referring Physician: Glendon Axe Treating Physician/Extender: Frann Rider in Treatment: 28 Wound Status Wound Number: 2 Primary Venous Leg Ulcer Etiology: Wound Location: Left Lower Leg - Distal Wound Open Wounding Event: Blister Status: Date Acquired: 12/30/2014 Comorbid Cataracts, Asthma, Coronary Artery Weeks Of Treatment: 28 History: Disease, Hypertension, Type II Clustered Wound: Yes Diabetes, Osteoarthritis, Neuropathy Photos Photo Uploaded By: [name], RN, [name], Kim on 08/18/2015 17:25:25 Wound Measurements Length: (cm) 5.8 % Reduction in Width: (cm) 4.6 % Reduction in Depth: (cm) 0.3 Epithelializati Area: (cm) 20.954 Tunneling: Volume: (cm) 6.286 Undermining: Area: -2863.8% Volume: -8753.5% on: Small (1-33%) No No Wound Description Full Thickness Without Classification: Exposed Support Structures Diabetic Severity Grade 1 (Wagner): Wound Margin: Distinct, outline attached Exudate Amount: Large Exudate Type: Serous Exudate Color: amber Foul Odor After Cleansing: No Wound Bed Granulation Amount: Large (67-100%) Exposed Structure Lubke, [name]  ([id number]) Granulation Quality: Red, Pink, Hyper-granulation Fascia Exposed: No Necrotic Amount: Small (1-33%) Fat Layer Exposed: No Necrotic Quality: Adherent Slough Tendon Exposed: No Muscle Exposed: No Joint Exposed: No Bone Exposed: No Limited to Skin Breakdown Periwound Skin Texture Texture Color No Abnormalities Noted: No No Abnormalities Noted: No Callus: No Atrophie Blanche: No Crepitus: No Cyanosis: No Excoriation: No Ecchymosis: No Fluctuance: No Erythema: No Friable: No Hemosiderin Staining: Yes Induration: No Mottled: No Localized Edema: No Pallor: No Rash: No Rubor: No Scarring: Yes Temperature / Pain Moisture Temperature: No Abnormality No Abnormalities Noted: No Tenderness on Palpation: Yes Dry / Scaly: No Maceration: No Moist: Yes Wound Preparation Ulcer Cleansing: Rinsed/Irrigated with Saline Topical Anesthetic Applied: Other: lidocaine 4%, Treatment Notes Wound #2 (Left, Distal Lower Leg) 1. Cleansed with: Clean wound with Normal Saline 2. Anesthetic Topical Lidocaine 4% cream to wound bed prior to debridement 4. Dressing Applied: Other dressing (specify in notes) Notes Siltec sorbact and iodoform packing gauze to satelite hole on LLE wound Electronic Signature(s) Signed: 08/18/2015 5:24:55 PM By: [name] Entered By: [name] on 08/18/2015 13:57:28 Viglione, [name] ([id number]) -------------------------------------------------------------------------------- Wound Assessment Details Patient Name: [name], [name] Date of Service: 08/18/2015 1:30 PM Medical Record Number: [id number] Patient Account Number: [id number] Date of Birth/Sex: 08/07/1946 (68 y.o. Female) Treating RN: [name] Primary Care Physician: Glendon Axe Other Clinician: Referring Physician: Glendon Axe Treating Physician/Extender: Frann Rider in Treatment: 28 Wound Status Wound Number: 5 Primary Venous Leg Ulcer Etiology: Wound  Location: Right Lower Leg - Posterior Wound Open Wounding Event: Gradually Appeared Status: Date Acquired: 04/29/2015 Comorbid Cataracts, Asthma, Coronary Artery Weeks Of Treatment: 12 History: Disease, Hypertension, Type II Clustered Wound: No Diabetes, Osteoarthritis, Neuropathy Photos Photo Uploaded By: [name], RN, [name], Kim on 08/18/2015 17:38:08 Wound Measurements Length: (cm) 3.1 Width: (cm) 4.6 Depth: (cm) 0.3 Area: (cm) 11.2 Volume: (cm)  3.36 % Reduction in Area: -5857.4% % Reduction in Volume: -17584.2% Epithelialization: None Tunneling: No Undermining: No Wound Description Full Thickness Without Classification: Exposed Support Structures Diabetic Severity Grade 1 (Wagner): Wound Margin: Distinct, outline attached Exudate Amount: Large Exudate Type: Serous Exudate Color: amber Foul Odor After Cleansing: No Wound Bed Granulation Amount: None Present (0%) Exposed Structure Welden, [name] ([id number]) Necrotic Amount: Large (67-100%) Fascia Exposed: No Necrotic Quality: Eschar Fat Layer Exposed: No Tendon Exposed: No Muscle Exposed: No Joint Exposed: No Bone Exposed: No Limited to Skin Breakdown Periwound Skin Texture Texture Color No Abnormalities Noted: No No Abnormalities Noted: No Callus: No Atrophie Blanche: No Crepitus: No Cyanosis: No Excoriation: No Ecchymosis: No Fluctuance: No Erythema: No Friable: No Hemosiderin Staining: Yes Induration: No Mottled: No Localized Edema: No Pallor: No Rash: No Rubor: No Scarring: No Temperature / Pain Moisture Temperature: No Abnormality No Abnormalities Noted: No Tenderness on Palpation: Yes Dry / Scaly: No Maceration: No Moist: No Wound Preparation Ulcer Cleansing: Rinsed/Irrigated with Saline Topical Anesthetic Applied: Other: Lidocaine 4% Ointment, Treatment Notes Wound #5 (Right, Posterior Lower Leg) 1. Cleansed with: Clean wound with Normal Saline 2. Anesthetic Topical Lidocaine  4% cream to wound bed prior to debridement 4. Dressing Applied: Santyl Ointment 5. Secondary Dressing Applied Bordered Foam Dressing Electronic Signature(s) Signed: 08/18/2015 5:24:55 PM By: [name] Entered By: [name] on 08/18/2015 13:57:51 [name], [name] ([id number]) -------------------------------------------------------------------------------- Vitals Details Patient Name: [name] D. Date of Service: 08/18/2015 1:30 PM Medical Record Number: [id number] Patient Account Number: [id number] Date of Birth/Sex: 09-Oct-1946 (68 y.o. Female) Treating RN: [name] Primary Care Physician: Glendon Axe Other Clinician: Referring Physician: Glendon Axe Treating Physician/Extender: Frann Rider in Treatment: 28 Vital Signs Time Taken: 13:49 Temperature (F): 97.9 Height (in): 65 Pulse (bpm): 66 Weight (lbs): 248 Respiratory Rate (breaths/min): 18 Body Mass Index (BMI): 41.3 Blood Pressure (mmHg): 133/61 Reference Range: 80 - 120 mg / dl Electronic Signature(s) Signed: 08/18/2015 5:24:55 PM By: [name] Entered By: [name] on 08/18/2015 13:51:21

## 2015-08-25 ENCOUNTER — Encounter: Payer: PPO | Admitting: Surgery

## 2015-08-25 DIAGNOSIS — L97322 Non-pressure chronic ulcer of left ankle with fat layer exposed: Secondary | ICD-10-CM | POA: Diagnosis not present

## 2015-08-26 NOTE — Progress Notes (Signed)
OTHELLA, SLAPPEY ([id number]) Visit Report for 08/25/2015 Arrival Information Details Patient Name: [name], [name] Date of Service: 08/25/2015 3:00 PM Medical Record Number: [id number] Patient Account Number: [id number] Date of Birth/Sex: Apr 21, 1946 (69 y.o. Female) Treating RN: [name] Primary Care Physician: Glendon Axe Other Clinician: Referring Physician: Glendon Axe Treating Physician/Extender: Frann Rider in Treatment: 29 Visit Information History Since Last Visit Added or deleted any medications: No Patient Arrived: Ambulatory Any new allergies or adverse reactions: No Arrival Time: 15:13 Had a fall or experienced change in No Accompanied By: self activities of daily living that may affect Transfer Assistance: None risk of falls: Patient Identification Verified: Yes Signs or symptoms of abuse/neglect since last No Secondary Verification Process Yes visito Completed: Hospitalized since last visit: No Patient Has Alerts: Yes Has Dressing in Place as Prescribed: Yes Patient Alerts: Patient on Blood Pain Present Now: No Thinner Electronic Signature(s) Signed: 08/25/2015 5:27:16 PM By: [name], RN, [name], [name], [name] Entered By: [name], RN, [name], Kim on 08/25/2015 15:13:31 Puskas, Tia Masker ([id number]) -------------------------------------------------------------------------------- Clinic Level of Care Assessment Details Patient Name: [name], [name] Date of Service: 08/25/2015 3:00 PM Medical Record Number: [id number] Patient Account Number: [id number] Date of Birth/Sex: April 09, 1946 (69 y.o. Female) Treating RN: [name] Primary Care Physician: Glendon Axe Other Clinician: Referring Physician: Glendon Axe Treating Physician/Extender: Frann Rider in Treatment: 29 Clinic Level of Care Assessment Items TOOL 4 Quantity Score []  - Use when only an EandM is performed on FOLLOW-UP visit 0 ASSESSMENTS - Nursing Assessment / Reassessment X -  Reassessment of Co-morbidities (includes updates in patient status) 1 10 X - Reassessment of Adherence to Treatment Plan 1 5 ASSESSMENTS - Wound and Skin Assessment / Reassessment []  - Simple Wound Assessment / Reassessment - one wound 0 X - Complex Wound Assessment / Reassessment - multiple wounds 2 5 []  - Dermatologic / Skin Assessment (not related to wound area) 0 ASSESSMENTS - Focused Assessment X - Circumferential Edema Measurements - multi extremities 2 5 []  - Nutritional Assessment / Counseling / Intervention 0 X - Lower Extremity Assessment (monofilament, tuning fork, pulses) 1 5 []  - Peripheral Arterial Disease Assessment (using hand held doppler) 0 ASSESSMENTS - Ostomy and/or Continence Assessment and Care []  - Incontinence Assessment and Management 0 []  - Ostomy Care Assessment and Management (repouching, etc.) 0 PROCESS - Coordination of Care X - Simple Patient / Family Education for ongoing care 1 15 []  - Complex (extensive) Patient / Family Education for ongoing care 0 []  - Staff obtains Programmer, systems, Records, Test Results / Process Orders 0 []  - Staff telephones HHA, Nursing Homes / Clarify orders / etc 0 []  - Routine Transfer to another Facility (non-emergent condition) 0 Theissen, Calyse D. ([id number]) []  - Routine Hospital Admission (non-emergent condition) 0 []  - New Admissions / Biomedical engineer / Ordering NPWT, Apligraf, etc. 0 []  - Emergency Hospital Admission (emergent condition) 0 X - Simple Discharge Coordination 1 10 []  - Complex (extensive) Discharge Coordination 0 PROCESS - Special Needs []  - Pediatric / Minor Patient Management 0 []  - Isolation Patient Management 0 []  - Hearing / Language / Visual special needs 0 []  - Assessment of Community assistance (transportation, D/C planning, etc.) 0 []  - Additional assistance / Altered mentation 0 []  - Support Surface(s) Assessment (bed, cushion, seat, etc.) 0 INTERVENTIONS - Wound Cleansing / Measurement []  -  Simple Wound Cleansing - one wound 0 X - Complex Wound Cleansing - multiple wounds 2 5 X - Wound Imaging (photographs -  any number of wounds) 1 5 []  - Wound Tracing (instead of photographs) 0 []  - Simple Wound Measurement - one wound 0 X - Complex Wound Measurement - multiple wounds 2 5 INTERVENTIONS - Wound Dressings X - Small Wound Dressing one or multiple wounds 2 10 []  - Medium Wound Dressing one or multiple wounds 0 []  - Large Wound Dressing one or multiple wounds 0 X - Application of Medications - topical 1 5 []  - Application of Medications - injection 0 INTERVENTIONS - Miscellaneous []  - External ear exam 0 Atallah, Danny D. ([id number]) []  - Specimen Collection (cultures, biopsies, blood, body fluids, etc.) 0 []  - Specimen(s) / Culture(s) sent or taken to Lab for analysis 0 []  - Patient Transfer (multiple staff / [name]) 0 []  - Simple Staple / Suture removal (25 or less) 0 []  - Complex Staple / Suture removal (26 or more) 0 []  - Hypo / Hyperglycemic Management (close monitor of Blood Glucose) 0 []  - Ankle / Brachial Index (ABI) - do not check if billed separately 0 X - Vital Signs 1 5 Has the patient been seen at the hospital within the last three years: Yes Total Score: 120 Level Of Care: New/Established - Level 4 Electronic Signature(s) Signed: 08/25/2015 5:18:22 PM By: [name] Entered By: [name] on 08/25/2015 15:41:57 Horwitz, Annakate D. ([id number]) -------------------------------------------------------------------------------- Encounter Discharge Information Details Patient Name: SKILYNN, [name] Date of Service: 08/25/2015 3:00 PM Medical Record Number: [id number] Patient Account Number: [id number] Date of Birth/Sex: 22-Jun-1946 (69 y.o. Female) Treating RN: [name] Primary Care Physician: Glendon Axe Other Clinician: Referring Physician: Glendon Axe Treating Physician/Extender: Frann Rider in Treatment:  77 Encounter Discharge Information Items Discharge Pain Level: 0 Discharge Condition: Stable Ambulatory Status: Cane Discharge Destination: Home Transportation: Private Auto Accompanied By: self Schedule Follow-up Appointment: Yes Medication Reconciliation completed and provided to Patient/Care No Muhammed Teutsch: Provided on Clinical Summary of Care: 08/25/2015 Form Type Recipient Paper Patient LM Electronic Signature(s) Signed: 08/25/2015 3:59:19 PM By: Ruthine Dose Entered By: Ruthine Dose on 08/25/2015 15:59:19 Daughenbaugh, Evelise D. ([id number]) -------------------------------------------------------------------------------- Lower Extremity Assessment Details Patient Name: [name] Date of Service: 08/25/2015 3:00 PM Medical Record Number: [id number] Patient Account Number: [id number] Date of Birth/Sex: 1946-01-23 (69 y.o. Female) Treating RN: [name] Primary Care Physician: Glendon Axe Other Clinician: Referring Physician: Glendon Axe Treating Physician/Extender: Frann Rider in Treatment: 29 Edema Assessment Assessed: [Left: No] [Right: No] E[Left: dema] [Right: :] Calf Left: Right: Point of Measurement: 30 cm From Medial Instep 35.5 cm 34.5 cm Ankle Left: Right: Point of Measurement: 9 cm From Medial Instep 25 cm 25 cm Vascular Assessment Pulses: Posterior Tibial Dorsalis Pedis Palpable: [Left:Yes] [Right:Yes] Extremity colors, hair growth, and conditions: Extremity Color: [Left:Hyperpigmented] [Right:Hyperpigmented] Hair Growth on Extremity: [Left:Yes] [Right:Yes] Temperature of Extremity: [Left:Warm] [Right:Warm] Capillary Refill: [Left:< 3 seconds] [Right:< 3 seconds] Toe Nail Assessment Left: Right: Thick: No No Discolored: No No Deformed: No No Improper Length and Hygiene: No Electronic Signature(s) Signed: 08/25/2015 5:27:16 PM By: [name], RN, [name], [name], [name] Entered By: [name], RN, [name], Kim on 08/25/2015 15:21:30 Okonski, Lizbeth D.  ([id number]) -------------------------------------------------------------------------------- Multi Wound Chart Details Patient Name: [name] Date of Service: 08/25/2015 3:00 PM Medical Record Number: [id number] Patient Account Number: [id number] Date of Birth/Sex: [birth year] (69 y.o. Female) Treating RN: [name] Primary Care Physician: Glendon Axe Other Clinician: Referring Physician: Glendon Axe Treating Physician/Extender: Frann Rider in Treatment: 29 Vital Signs Height(in): 65 Pulse(bpm): 68 Weight(lbs): 248  Blood Pressure 135/68 (mmHg): Body Mass Index(BMI): 41 Temperature(F): 97.8 Respiratory Rate 18 (breaths/min): Photos: [2:No Photos] [5:No Photos] [N/A:N/A] Wound Location: [2:Left Lower Leg - Distal] [5:Right, Posterior Lower Leg] [N/A:N/A] Wounding Event: [2:Blister] [5:Gradually Appeared] [N/A:N/A] Primary Etiology: [2:Venous Leg Ulcer] [5:Venous Leg Ulcer] [N/A:N/A] Comorbid History: [2:Cataracts, Asthma, Coronary Artery Disease, Hypertension, Type II Diabetes, Osteoarthritis, Neuropathy] [5:N/A] [N/A:N/A] Date Acquired: [2:12/30/2014] [5:04/29/2015] [N/A:N/A] Weeks of Treatment: [2:29] [5:13] [N/A:N/A] Wound Status: [2:Open] [5:Open] [N/A:N/A] Clustered Wound: [2:Yes] [5:No] [N/A:N/A] Measurements L x W x D 6x5.2x0.2 [5:3.9x5.1x0.1] [N/A:N/A] (cm) Area (cm) : [2:24.504] [5:15.622] [N/A:N/A] Volume (cm) : [7:5.643] [5:1.562] [N/A:N/A] % Reduction in Area: [2:-3365.90%] [5:-8209.60%] [N/A:N/A] % Reduction in Volume: -6802.80% [5:-8121.10%] [N/A:N/A] Classification: [2:Full Thickness Without Exposed Support Structures] [5:Full Thickness Without Exposed Support Structures] [N/A:N/A] HBO Classification: [2:Grade 1] [5:N/A] [N/A:N/A] Exudate Amount: [2:Large] [5:N/A] [N/A:N/A] Exudate Type: [2:Serous] [5:N/A] [N/A:N/A] Exudate Color: [2:amber] [5:N/A] [N/A:N/A] Wound Margin: [2:Distinct, outline attached] [5:N/A] [N/A:N/A] Granulation  Amount: [2:Large (67-100%)] [5:N/A] [N/A:N/A] Granulation Quality: [5:N/A] [N/A:N/A] Red, Pink, Hyper- granulation Necrotic Amount: Small (1-33%) N/A N/A Exposed Structures: Fascia: No N/A N/A Fat: No Tendon: No Muscle: No Joint: No Bone: No Limited to Skin Breakdown Epithelialization: Small (1-33%) N/A N/A Periwound Skin Texture: Scarring: Yes No Abnormalities Noted N/A Edema: No Excoriation: No Induration: No Callus: No Crepitus: No Fluctuance: No Friable: No Rash: No Periwound Skin Moist: Yes No Abnormalities Noted N/A Moisture: Maceration: No Dry/Scaly: No Periwound Skin Color: Hemosiderin Staining: Yes No Abnormalities Noted N/A Atrophie Blanche: No Cyanosis: No Ecchymosis: No Erythema: No Mottled: No Pallor: No Rubor: No Temperature: No Abnormality N/A N/A Tenderness on Yes No N/A Palpation: Wound Preparation: Ulcer Cleansing: N/A N/A Rinsed/Irrigated with Saline Topical Anesthetic Applied: Other: lidocaine 4% Treatment Notes Electronic Signature(s) Signed: 08/25/2015 5:27:16 PM By: [name], RN, [name], [name], [name] Entered By: [name], RN, [name], Kim on 08/25/2015 15:25:56 Hickok, JAYDY FITZHENRY ([id number]) BRANDE, UNCAPHER ([id number]) -------------------------------------------------------------------------------- Multi-Disciplinary Care Plan Details Patient Name: DECLYN, OFFIELD. Date of Service: 08/25/2015 3:00 PM Medical Record Number: [id number] Patient Account Number: [id number] Date of Birth/Sex: 1945/11/28 (69 y.o. Female) Treating RN: [name] Primary Care Physician: Glendon Axe Other Clinician: Referring Physician: Glendon Axe Treating Physician/Extender: Frann Rider in Treatment: 56 Active Inactive Orientation to the Wound Care Program Nursing Diagnoses: Knowledge deficit related to the wound healing center program Goals: Patient/caregiver will verbalize understanding of the [facility] Date Initiated: 01/31/2015 Goal  Status: Active Interventions: Provide education on orientation to the wound center Notes: Venous Leg Ulcer Nursing Diagnoses: Potential for venous Insuffiency (use before diagnosis confirmed) Goals: Non-invasive venous studies are completed as ordered Date Initiated: 01/31/2015 Goal Status: Active Patient/caregiver will verbalize understanding of disease process and disease management Date Initiated: 01/31/2015 Goal Status: Active Interventions: Assess peripheral edema status every visit. Notes: Wound/Skin Impairment Nursing Diagnoses: Impaired tissue integrity Knowledge deficit related to smoking impact on wound healing Seawright, Aiden D. ([id number]) Goals: Patient/caregiver will verbalize understanding of skin care regimen Date Initiated: 01/31/2015 Goal Status: Active Ulcer/skin breakdown will heal within 14 weeks Date Initiated: 01/31/2015 Goal Status: Active Interventions: Assess ulceration(s) every visit Notes: Electronic Signature(s) Signed: 08/25/2015 5:27:16 PM By: [name], RN, [name], [name], [name] Entered By: [name], RN, [name], Kim on 08/25/2015 15:25:48 Yoon, Jahliyah D. ([id number]) -------------------------------------------------------------------------------- Pain Assessment Details Patient Name: COLETTA, LOCKNER D. Date of Service: 08/25/2015 3:00 PM Medical Record Number: [id number] Patient Account Number: [id number] Date of Birth/Sex: 05-03-1946 (69 y.o. Female) Treating RN: [name] Primary Care Physician: Glendon Axe Other  Clinician: Referring Physician: Glendon Axe Treating Physician/Extender: Frann Rider in Treatment: 29 Active Problems Location of Pain Severity and Description of Pain Patient Has Paino No Site Locations Pain Management and Medication Current Pain Management: Electronic Signature(s) Signed: 08/25/2015 5:27:16 PM By: [name], RN, [name], [name], [name] Entered By: [name], RN, [name], Kim on 08/25/2015 15:14:43 Wernette, Tia Masker  ([id number]) -------------------------------------------------------------------------------- Patient/Caregiver Education Details Patient Name: [name] Date of Service: 08/25/2015 3:00 PM Medical Record Number: [id number] Patient Account Number: [id number] Date of Birth/Gender: June 01, 1946 (69 y.o. Female) Treating RN: [name] Primary Care Physician: Glendon Axe Other Clinician: Referring Physician: Glendon Axe Treating Physician/Extender: Frann Rider in Treatment: 51 Education Assessment Education Provided To: Patient Education Topics Provided Wound/Skin Impairment: Handouts: Other: wound care as ordered Methods: Demonstration, Explain/Verbal Responses: State content correctly Electronic Signature(s) Signed: 08/25/2015 5:18:22 PM By: [name] Entered By: [name] on 08/25/2015 15:58:39 Shaul, Peighton D. ([id number]) -------------------------------------------------------------------------------- Wound Assessment Details Patient Name: Pinder, Amari D. Date of Service: 08/25/2015 3:00 PM Medical Record Number: [id number] Patient Account Number: [id number] Date of Birth/Sex: 08-Sep-1946 (69 y.o. Female) Treating RN: [name] Primary Care Physician: Glendon Axe Other Clinician: Referring Physician: Glendon Axe Treating Physician/Extender: Frann Rider in Treatment: 29 Wound Status Wound Number: 2 Primary Venous Leg Ulcer Etiology: Wound Location: Left Lower Leg - Distal Wound Open Wounding Event: Blister Status: Date Acquired: 12/30/2014 Comorbid Cataracts, Asthma, Coronary Artery Weeks Of Treatment: 29 History: Disease, Hypertension, Type II Clustered Wound: Yes Diabetes, Osteoarthritis, Neuropathy Photos Photo Uploaded By: [name], RN, [name], Kim on 08/25/2015 17:06:07 Wound Measurements Length: (cm) 6 % Reduction in Width: (cm) 5.2 % Reduction in Depth: (cm) 0.2 Epithelializati Area: (cm) 24.504 Volume: (cm)  4.901 Area: -3365.9% Volume: -6802.8% on: Small (1-33%) Wound Description Full Thickness Without Classification: Exposed Support Structures Diabetic Severity Grade 1 (Wagner): PARRIS, SIGNER ([id number]) Foul Odor After Cleansing: No Wound Margin: Distinct, outline attached Exudate Amount: Large Exudate Type: Serous Exudate Color: amber Wound Bed Granulation Amount: Large (67-100%) Exposed Structure Granulation Quality: Red, Pink, Hyper-granulation Fascia Exposed: No Necrotic Amount: Small (1-33%) Fat Layer Exposed: No Necrotic Quality: Adherent Slough Tendon Exposed: No Muscle Exposed: No Joint Exposed: No Bone Exposed: No Limited to Skin Breakdown Periwound Skin Texture Texture Color No Abnormalities Noted: No No Abnormalities Noted: No Callus: No Atrophie Blanche: No Crepitus: No Cyanosis: No Excoriation: No Ecchymosis: No Fluctuance: No Erythema: No Friable: No Hemosiderin Staining: Yes Induration: No Mottled: No Localized Edema: No Pallor: No Rash: No Rubor: No Scarring: Yes Temperature / Pain Moisture Temperature: No Abnormality No Abnormalities Noted: No Tenderness on Palpation: Yes Dry / Scaly: No Maceration: No Moist: Yes Wound Preparation Ulcer Cleansing: Rinsed/Irrigated with Saline Topical Anesthetic Applied: Other: lidocaine 4%, Treatment Notes Wound #2 (Left, Distal Lower Leg) 1. Cleansed with: Clean wound with Normal Saline 2. Anesthetic Topical Lidocaine 4% cream to wound bed prior to debridement 3. Peri-wound Care: Skin Prep 4. Dressing Applied: KYA, MAYFIELD ([id number]) Other dressing (specify in notes) Notes Siltec sorbact Electronic Signature(s) Signed: 08/25/2015 5:27:16 PM By: [name], RN, [name], [name], [name] Entered By: [name], RN, [name], Kim on 08/25/2015 15:23:18 Harvel, Tia Masker ([id number]) -------------------------------------------------------------------------------- Wound Assessment Details Patient Name: CHARMIAN, FORBIS D. Date of Service: 08/25/2015 3:00 PM Medical Record Number: [id number] Patient Account Number: [id number] Date of Birth/Sex: Apr 29, 1946 (69 y.o. Female) Treating RN: [name] Primary Care Physician: Glendon Axe Other Clinician: Referring Physician: Glendon Axe Treating Physician/Extender: Frann Rider in Treatment: 29 Wound Status Wound  Number: 5 Primary Etiology: Venous Leg Ulcer Wound Location: Right, Posterior Lower Leg Wound Status: Open Wounding Event: Gradually Appeared Date Acquired: 04/29/2015 Weeks Of Treatment: 13 Clustered Wound: No Photos Photo Uploaded By: [name], RN, [name], Kim on 08/25/2015 17:06:37 Wound Measurements Length: (cm) 3.9 Width: (cm) 5.1 Depth: (cm) 0.1 Area: (cm) 15.622 Volume: (cm) 1.562 % Reduction in Area: -8209.6% % Reduction in Volume: -8121.1% Wound Description Full Thickness Without Exposed Classification: Support Structures Periwound Skin Texture Texture Color No Abnormalities Noted: No No Abnormalities Noted: No Moisture No Abnormalities Noted: No Treatment Notes Wound #5 (Right, Posterior Lower Leg) Dondlinger, Onie D. ([id number]) 1. Cleansed with: Clean wound with Normal Saline 2. Anesthetic Topical Lidocaine 4% cream to wound bed prior to debridement 3. Peri-wound Care: Skin Prep 4. Dressing Applied: Santyl Ointment 5. Secondary Dressing Applied Bordered Foam Dressing Electronic Signature(s) Signed: 08/25/2015 5:27:16 PM By: [name], RN, [name], [name], [name] Entered By: [name], RN, [name], Kim on 08/25/2015 15:22:02 Schley, Tia Masker ([id number]) -------------------------------------------------------------------------------- Vitals Details Patient Name: SHARNICE, BOSLER D. Date of Service: 08/25/2015 3:00 PM Medical Record Number: [id number] Patient Account Number: [id number] Date of Birth/Sex: 09-24-1946 (69 y.o. Female) Treating RN: [name] Primary Care Physician: Glendon Axe Other Clinician: Referring  Physician: Glendon Axe Treating Physician/Extender: Frann Rider in Treatment: 29 Vital Signs Time Taken: 05:14 Temperature (F): 97.8 Height (in): 65 Pulse (bpm): 68 Weight (lbs): 248 Respiratory Rate (breaths/min): 18 Body Mass Index (BMI): 41.3 Blood Pressure (mmHg): 135/68 Reference Range: 80 - 120 mg / dl Electronic Signature(s) Signed: 08/25/2015 5:27:16 PM By: [name], RN, [name], [name], [name] Entered By: [name], RN, [name], Kim on 08/25/2015 15:17:13

## 2015-08-26 NOTE — Progress Notes (Addendum)
[name] ([id number]) Visit Report for 08/25/2015 Chief Complaint Document Details Patient Name: [name] 08/25/2015 3:00 Date of Service: PM Medical Record [id number] Number: Patient Account Number: [id number] Apr 16, 1946 (69 y.o. Treating RN: [name] Date of Birth/Sex: Female) Other Clinician: Primary Care Physician: [facility], Delana Meyer Treating [name] Referring Physician: Glendon Axe Physician/Extender: Weeks in Treatment: 29 Information Obtained from: Patient Chief Complaint Patient presents to the wound care center for a consult due non healing wound 69 year old patient comes with a history of having a ulcer on the left lower extremity for the past 4 weeks. she says she's had swelling of both lower extremities for about a year after she started having prednisone. 02/07/2015 -- her vascular appointments obtained were in the first and third week of June. she is able to go to [place] and we will try and get her some earlier appointments. Other than that nothing else has changed in her management. Electronic Signature(s) Signed: 08/25/2015 3:46:40 PM By: [name], [name] Entered By: [name] on 08/25/2015 15:46:40 Nordell, [name] ([id number]) -------------------------------------------------------------------------------- HPI Details Patient Name: [name], [name] 08/25/2015 3:00 Date of Service: PM Medical Record [id number] Number: Patient Account Number: [id number] 21-Jul-1946 (69 y.o. Treating RN: [name] Date of Birth/Sex: Female) Other Clinician: Primary Care Physician: [facility], Delana Meyer Treating [name] Referring Physician: Glendon Axe Physician/Extender: Weeks in Treatment: 55 History of Present Illness HPI Description: 69 year old patient who is known to have diabetes mellitus type 2, chronic renal insufficiency, coronary artery disease, hypertension, hypercholesterolemia, temporal arteritis and inflammatory  arthritiss also has a history of having a hysterectomy and some orthopedic related surgeries. The ulcer on the left lower extremity started off as a blister and then. Got progressively worse. She does not have any fever or chills and has not had any recent surgical intervention for this. Her last hemoglobin A1c was 10.1 in September 2015. She has been recently put on doxycycline by her PCP. She is now also allergic to doxycycline and was this was changed over to Keflex. 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 She does see a cardiologist and also takes a diuretic. 02/07/2015 her arterial and venous duplex studies to be done have dates been given as the first and third week of June. This is at [facility]. We are going to try and get early appointments at [facility]. other than that nothing has changed in her management. 02/14/2015 -- we have been able to get her an appointment in [facility] on May 20 which is much earlier than her previous ones at [place]. She continues with her prednisone and her sugars are in the range of 150-200. 02/21/2015 We were able to get a vascular lab workup for her today and she is going to be there at 2:00 this afternoon. the swelling of her leg has gone down significantly but she still has some tenderness over the wounds. 02/28/2015 - She has had one of two vascular workups done, and this coming Tuesday has another, at [place] [facility]. She continues to be on steroid medications. She has significant sensitivity in her left lower extremity and has pain suggestive of neuropathic pain and I have asked her to address this with her primary care physician. 03/07/2015 -- The patient saw Dr. Lucky Cowboy for a consultation and he has had her arteries are okay but she has 2 incompetent veins on the  left lower extremity and he is going to set her up  for surgery. Official report is awaited. Addendum: Official reports are now available and on 03/04/2015. She was seen and lower extremity venous duplex exam was done. There was reflux present within the left greater saphenous vein below the knee and also the left small saphenous vein. Arterial duplex showed normal triphasic waveforms throughout the left lower extremity without any significant stenosis. Her ABIs were noncompressible bilaterally but a waveforms were normal and a digital pressures were normal bilaterally consistent with no significant arterial insufficiency. He has recommended endovenous ablation of both the left small saphenous and the left great saphenous vein. This would still be scheduled later. 03/14/2015 -- she has heard back from the vascular office and has surgery scheduled for sometime in July. [name] ([id number]) Her rheumatologist has decreased her prednisone dosage but she still on it. She has also had cataract surgery in her right eye recently this week. 03/20/2015 - No new complaints today. Pain improved. No fever or chills. Tolerating 2 layer compression. 04/14/2015 -- she was doing very well today she went off on vacation and now her edema has increased markedly the ulceration is bigger and her diabetes is not under control. 04/21/2015 -- I spoke to her PCP Dr. Candiss Norse and discussed the management which would include being seen by a general surgeon for debridement and taking multiple punch biopsies which would help in establishing the diagnosis of this is a vasculitis. She is agreeable about this and will set her up for the procedure with Dr. Tamala Julian at [facility]. She was seen by the surgeon Dr. Jamal Collin. 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 She is scheduled to have laser ablation done next week.Pt was sent here for possible biopsy to  look for vasculitis. Feel it would be better to wait after laser ablation is completed- the ulcer may heal fully and biopsy may not be necessary 04/29/2015 -- she had the venous ablation done by Dr. Lucky Cowboy last Friday and we do not have any notes yet. She is doing fine otherwise. 05/06/2015 --Review of her recent vascular intervention shows that she was seen by Dr. Lucky Cowboy on 04/29/2015. The follow-up duplex which was done showed that both the great saphenous vein and the small saphenous vein remained patent with reflux consistent with an unsuccessful ablation. He has rescheduled her for another the endovenous ablation to be done in about 4 weekso time. 05/13/2015 -- he was seen by her surgeon Dr. Jamal Collin who asked her to continue with conservative therapy and he would speak to Dr. Lucky Cowboy about her management. Dr. Lucky Cowboy is going to schedule her surgery in the middle of August for a repeat endovenous ablation. Her pus culture from last week has grown : [place] her noted her sensitivity report but due to her multiple allergies I had tried clindamycin and she developed a rash with this too. She has been prescribed and anti-buttocks in the ER and is has it at home and she will let is know what she is going to be taking. 05/20/2015 -- she has developed a small spot on her right lower extremity but besides that it is not a full fledged ulceration. She did not get to see Dr. Lucky Cowboy last week and hopefully she will see him in the near future. 05/27/2015 -she is still awaiting her  appointment with Dr.Dew and her vascular procedure is not scheduled until August 19. She will be seeing her PCP tomorrow and I have asked her to convey our discussion so that she is aware that debridement has not been done yet. 06/03/2015 -- was seen by her rheumatologist Dr. Dorthula Matas, who has been treating her for temporal  arteritis and in his note has mentioned the possibility of vasculitis or pyoderma gangrenosum. He is lowering her prednisone to 12-1/2 mg for 1 month and then 10 mg per the next month. I will again make an attempt to speak to her PCP Dr. Candiss Norse and her surgeon Dr. 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 06/17/2015 -- Dr.Dew did her vascular procedure last week and a follow-up venous ultrasound shows good resolution of the veins as per the patient's history. He is to see her back in 2 weeks. [name] ([id number]) 07/07/2015. -- the patient has had a heavy growth of Proteus mirabilis and Enterococcus faecalis. These are sensitive to several drugs but the problem is she has allergies to all of these and hence I would like her to see Dr. Ola Spurr for this. She is also due to see Dr. Lucky Cowboy tomorrow and I will discussed the management with him including debridement under anesthesia and possible biopsies. 07/14/2015 -- she has an appointment to see Dr. Ola Spurr tomorrow and did see Dr. Bunnie Domino PA who will discuss my request with him. 07/21/2015 -- saw Dr. Ola Spurr was able to do a test on her and has put her on amoxicillin. She has been tolerating that and has had no problems with allergies to this. 07/28/2015 -- Last Friday I spoke to Dr. Leotis Pain regarding her care and he said that her right leg did not need any surgery and on the left leg was doing pretty good. We did agree that if she undergoes any procedure in the future he would do a couple of punch biopsies of the wound. 08/18/2015 -- her right leg is very tender and there is significant amount of slough. The left leg is looking much better Electronic Signature(s) Signed: 08/25/2015 3:46:50 PM By: [name], [name] Entered By: [name] on 08/25/2015 15:46:50 [name]  ([id number]) -------------------------------------------------------------------------------- Physical Exam Details Patient Name: TAIMI, TOWE D. 08/25/2015 3:00 Date of Service: PM Medical Record [id number] Number: Patient Account Number: [id number] Mar 08, 1946 (68 y.o. Treating RN: [name] Date of Birth/Sex: Female) Other Clinician: Primary Care Physician: [facility], Delana Meyer Treating [name] Referring Physician: Glendon Axe Physician/Extender: Weeks in Treatment: 29 Constitutional . Pulse regular. Respirations normal and unlabored. Afebrile. . Eyes Nonicteric. Reactive to light. Ears, Nose, Mouth, and Throat Lips, teeth, and gums WNL.Marland Kitchen Moist mucosa without lesions . Neck supple and nontender. No palpable supraclavicular or cervical adenopathy. Normal sized without goiter. Respiratory WNL. No retractions.. Cardiovascular Pedal Pulses WNL. No clubbing, cyanosis or edema. Genitourinary (GU) No hydrocele, spermatocele, tenderness of the cord, or testicular mass.Marland Kitchen Penis without lesions.Lowella Fairy without lesions. No cystocele, or rectocele. Pelvic support intact, no discharge. Marland Kitchen Urethra without masses, tenderness or scarring.Marland Kitchen Lymphatic No adneopathy. No adenopathy. No adenopathy. Musculoskeletal Adexa without tenderness or enlargement.. Digits and nails w/o clubbing, cyanosis, infection, petechiae, ischemia, or inflammatory conditions.. Integumentary (Hair, Skin) No suspicious lesions. No crepitus or fluctuance. No peri-wound warmth or erythema. No masses.Marland Kitchen Psychiatric Judgement and insight Intact.. No evidence of depression, anxiety, or agitation.. Notes The  right leg is extremely tender there is a lot of slough and the size is increasing. Debridement is not possible in the office. The left lower extremity wound is looking much cleaner healthy granulation tissue and no cellulitis. Electronic Signature(s) Signed: 08/25/2015 3:47:44 PM By: [name],  [name] Entered By: [name] on 08/25/2015 15:47:43 [name] ([id number]) [name] ([id number]) -------------------------------------------------------------------------------- Physician Orders Details Patient Name: [name], [name] 08/25/2015 3:00 Date of Service: PM Medical Record [id number] Number: Patient Account Number: [id number] 1946/01/20 (69 y.o. Treating RN: [name] Date of Birth/Sex: Female) Other Clinician: Primary Care Physician: [facility], Delana Meyer Treating [name] Referring Physician: Glendon Axe Physician/Extender: [place] in Treatment: 35 Verbal / Phone Orders: Yes Clinician: [name] Read Back and Verified: Yes Diagnosis Coding Wound Cleansing Wound #2 Left,Distal Lower Leg o Clean wound with Normal Saline. Wound #5 Right,Posterior Lower Leg o Clean wound with Normal Saline. Anesthetic Wound #2 Left,Distal Lower Leg o Topical Lidocaine 4% cream applied to wound bed prior to debridement Wound #5 Right,Posterior Lower Leg o Topical Lidocaine 4% cream applied to wound bed prior to debridement Primary Wound Dressing Wound #2 Left,Distal Lower Leg o Other: - siltec sorbact Wound #5 Right,Posterior Lower Leg o Santyl Ointment Secondary Dressing Wound #5 Right,Posterior Lower Leg o Boardered Foam Dressing Dressing Change Frequency Wound #2 Left,Distal Lower Leg o Change dressing every other day. - daily if necessary due to drainage Wound #5 Right,Posterior Lower Leg o Change dressing every other day. - daily if necessary due to drainage Follow-up Appointments Wound #2 Left,Distal Lower Leg Fei, Baeleigh D. ([id number]) o Return Appointment in 1 week. Wound #5 Right,Posterior Lower Leg o Return Appointment in 1 week. Edema Control Wound #2 Left,Distal Lower Leg o Support Garment 20-30 mm/Hg pressure to: Wound #5 Right,Posterior Lower Leg o Support Garment 20-30 mm/Hg pressure to: Additional Orders /  Instructions Wound #2 Left,Distal Lower Leg o Increase protein intake. Wound #5 Right,Posterior Lower Leg o Increase protein intake. Medications-please add to medication list. Wound #2 Left,Distal Lower Leg o P.O. Antibiotics - Complete antibiotics prescribed by Dr. Ola Spurr Wound #5 Right,Posterior Lower Leg o P.O. Antibiotics - Complete antibiotics prescribed by Dr. Ola Spurr Services and Therapies o Surgical Wound Debridement - [facility] Dr Lucky Cowboy referral for sx debridement and biopsy oooo Electronic Signature(s) Signed: 08/25/2015 5:18:22 PM By: [name] Signed: 08/26/2015 8:07:08 AM By: [name], [name] Previous Signature: 08/25/2015 4:25:19 PM Version By: [name], [name] Entered By: [name] on 08/25/2015 16:36:05 [name] ([id number]) -------------------------------------------------------------------------------- Problem List Details Patient Name: [name], [name] 08/25/2015 3:00 Date of Service: PM Medical Record [id number] Number: Patient Account Number: [id number] 1946/04/07 (69 y.o. 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 of 03/07/2015 Yes calf and inflammation I83.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 Treating RN: [name] Date of Birth/Sex: Female) Other Clinician: Primary Care Physician: [facility], Delana Meyer Treating [name] Referring Physician: Glendon Axe Physician/Extender: Weeks in Treatment: 29 Subjective Chief Complaint Information obtained from Patient Patient presents to the wound care center for a consult due non healing wound 69 year old patient comes with a history of having a ulcer on the left lower extremity for the past 4 weeks. she says she's had swelling of both lower extremities for about a year after she started having prednisone. 02/07/2015 -- her vascular appointments obtained were in the first and third week of June. she is able to go to [place] and we will try and get her some earlier appointments. Other than that nothing else has changed in her management. History of Present Illness (HPI) 69 year old patient who is known to have diabetes mellitus type 2, chronic renal insufficiency, coronary artery disease, hypertension, hypercholesterolemia, temporal arteritis and inflammatory arthritiss also has a history of having a hysterectomy and some orthopedic related surgeries. The ulcer on the left lower extremity started off as a blister and then. Got progressively worse. She does not have any fever or chills and has not had any recent surgical intervention for this. Her last hemoglobin A1c was 10.1 in September 2015. She has been recently put on doxycycline by her PCP. She is now also allergic to  doxycycline and was this was changed over to Keflex. 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 She does see a cardiologist and also takes a diuretic. 02/07/2015 her arterial and venous duplex studies to be done have dates been given as the first and third week of June. This is at [facility]. We are going to try and get early appointments at [facility]. other than that nothing has changed in her management. 02/14/2015 -- we have been able to get her an appointment in [facility] on May 20 which is much earlier than her previous ones at [place]. She continues with her prednisone and her sugars are in the range of 150-200. 02/21/2015 We were able to get a vascular lab workup for her today and she is going to be there at 2:00 this afternoon. the swelling of her leg has gone down significantly but she still has some tenderness over the wounds. 02/28/2015 - She has had one of two vascular workups done, and this coming Tuesday has another, at [place] [facility]. She continues to be on steroid medications. She has significant sensitivity in her left lower extremity and has pain suggestive of neuropathic pain and I have asked her to address this with her primary care physician. 03/07/2015 -- The patient saw Dr. Lucky Cowboy for a consultation and he has had her arteries are okay but she has 2 incompetent veins on the left lower extremity and he is going to set her up for surgery. Official report is LATREECE, MOCHIZUKI ([id number]) awaited. Addendum: Official reports are now available and on 03/04/2015. She was seen and lower extremity venous duplex exam was done. There was reflux present within the left greater saphenous vein below the knee and also the left small saphenous vein. Arterial duplex showed normal triphasic waveforms throughout the left lower extremity without any  significant stenosis. Her ABIs were noncompressible bilaterally but a waveforms were normal and a digital pressures were normal bilaterally consistent with no significant arterial insufficiency. He has recommended endovenous ablation of both the left small saphenous and the left great saphenous vein. This would still be scheduled later. 03/14/2015 -- she has heard back from the vascular office and has surgery scheduled for sometime in July. Her rheumatologist has decreased her prednisone dosage but she still on it. She has also had cataract surgery in her right eye recently this week. 03/20/2015 - No new complaints today. Pain improved. No fever or chills. Tolerating 2 layer compression. 04/14/2015 -- she was doing very well today she went off on vacation and now her edema has increased markedly the ulceration is bigger and her diabetes is not under control. 04/21/2015 -- I spoke to her PCP Dr. Candiss Norse and discussed the management which would include being seen by a general surgeon for debridement and taking multiple punch biopsies which would help in establishing the diagnosis of this is a vasculitis. She is agreeable about this and will set her up for the procedure with Dr. Tamala Julian at [facility]. She was seen by the surgeon Dr. Jamal Collin. 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 She is scheduled to have laser ablation done next week.Pt was sent here for possible biopsy to look for vasculitis. Feel it would be better to wait after laser ablation is completed- the ulcer may heal fully and biopsy may not be necessary 04/29/2015 -- she had the venous ablation done by Dr. Lucky Cowboy last Friday and we do not have any notes yet. She is doing fine otherwise. 05/06/2015 --Review of her recent vascular intervention shows that she was seen by Dr. Lucky Cowboy on 04/29/2015. The follow-up duplex which was  done showed that both the great saphenous vein and the small saphenous vein remained patent with reflux consistent with an unsuccessful ablation. He has rescheduled her for another the endovenous ablation to be done in about 4 weeks time. 05/13/2015 -- he was seen by her surgeon Dr. Jamal Collin who asked her to continue with conservative therapy and he would speak to Dr. Lucky Cowboy about her management. Dr. Lucky Cowboy is going to schedule her surgery in the middle of August for a repeat endovenous ablation. Her pus culture from last week has grown : [place] her noted her sensitivity report but due to her multiple allergies I had tried clindamycin and she developed a rash with this too. She has been prescribed and anti-buttocks in the ER and is has it at home and she will let is know what she is going to be taking. 05/20/2015 -- she has developed a small spot on her right lower extremity but besides that it is not a full fledged ulceration. She did not get to see Dr. Lucky Cowboy last week and hopefully she will see him in the near future. 05/27/2015 -she is still awaiting her appointment with Dr.Dew and her vascular procedure is not scheduled until August 19. She will be seeing her PCP tomorrow and I have asked her to convey our discussion so that she is aware that debridement has not been done yet. ARZELLA, REHMANN ([id number]) 06/03/2015 -- was seen by her rheumatologist Dr. Dorthula Matas, who has been treating her for temporal arteritis and in his note has mentioned the possibility of vasculitis or  pyoderma gangrenosum. He is lowering her prednisone to 12-1/2 mg for 1 month and then 10 mg per the next month. I will again make an attempt to speak to her PCP Dr. Candiss Norse and her surgeon Dr. Lucky Cowboy to see if he can organize for a debridement in the OR with multiple biopsies to establish a diagnosis of vasculitis  or pyoderma gangrenosum. 06/17/2015 -- Dr.Dew did her vascular procedure last week and a follow-up venous ultrasound shows good resolution of the veins as per the patient's history. He is to see her back in 2 weeks. 07/07/2015. -- the patient has had a heavy growth of Proteus mirabilis and Enterococcus faecalis. These are sensitive to several drugs but the problem is she has allergies to all of these and hence I would like her to see Dr. Ola Spurr for this. She is also due to see Dr. Lucky Cowboy tomorrow and I will discussed the management with him including debridement under anesthesia and possible biopsies. 07/14/2015 -- she has an appointment to see Dr. Ola Spurr tomorrow and did see Dr. Bunnie Domino PA who will discuss my request with him. 07/21/2015 -- saw Dr. Ola Spurr was able to do a test on her and has put her on amoxicillin. She has been tolerating that and has had no problems with allergies to this. 07/28/2015 -- Last Friday I spoke to Dr. Leotis Pain regarding her care and he said that her right leg did not need any surgery and on the left leg was doing pretty good. We did agree that if she undergoes any procedure in the future he would do a couple of punch biopsies of the wound. 08/18/2015 -- her right leg is very tender and there is significant amount of slough. The left leg is looking much better Objective Constitutional Pulse regular. Respirations normal and unlabored. Afebrile. Vitals Time Taken: 5:14 AM, Height: 65 in, Weight: 248 lbs, BMI: 41.3, Temperature: 97.8 F, Pulse: 68 bpm, Respiratory Rate: 18 breaths/min, Blood Pressure: 135/68 mmHg. Eyes Nonicteric. Reactive to light. Ears, Nose, Mouth, and Throat Lips, teeth, and gums WNL.Marland Kitchen Moist mucosa without lesions . Neck supple and nontender. No palpable supraclavicular or cervical adenopathy. Normal sized without goiter. Respiratory Nicolls, Malika D. ([id number]) WNL. No retractions.. Cardiovascular Pedal Pulses WNL. No clubbing,  cyanosis or edema. Genitourinary (GU) No hydrocele, spermatocele, tenderness of the cord, or testicular mass.Marland Kitchen Penis without lesions.Lowella Fairy without lesions. No cystocele, or rectocele. Pelvic support intact, no discharge. Marland Kitchen Urethra without masses, tenderness or scarring.Marland Kitchen Lymphatic No adneopathy. No adenopathy. No adenopathy. Musculoskeletal Adexa without tenderness or enlargement.. Digits and nails w/o clubbing, cyanosis, infection, petechiae, ischemia, or inflammatory conditions.Marland Kitchen Psychiatric Judgement and insight Intact.. No evidence of depression, anxiety, or agitation.. General Notes: The right leg is extremely tender there is a lot of slough and the size is increasing. Debridement is not possible in the office. The left lower extremity wound is looking much cleaner healthy granulation tissue and no cellulitis. Integumentary (Hair, Skin) No suspicious lesions. No crepitus or fluctuance. No peri-wound warmth or erythema. No masses.. Wound #2 status is Open. Original cause of wound was Blister. The wound is located on the Left,Distal Lower Leg. The wound measures 6cm length x 5.2cm width x 0.2cm depth; 24.504cm^2 area and 4.901cm^3 volume. The wound is limited to skin breakdown. There is a large amount of serous drainage noted. The wound margin is distinct with the outline attached to the wound base. There is large (67-100%) red, pink granulation within the wound bed. There is a small (1-33%)  amount of necrotic tissue within the wound bed including Adherent Slough. The periwound skin appearance exhibited: Scarring, Moist, Hemosiderin Staining. The periwound skin appearance did not exhibit: Callus, Crepitus, Excoriation, Fluctuance, Friable, Induration, Localized Edema, Rash, Dry/Scaly, Maceration, Atrophie Blanche, Cyanosis, Ecchymosis, Mottled, Pallor, Rubor, Erythema. Periwound temperature was noted as No Abnormality. The periwound has tenderness on palpation. Wound #5 status is  Open. Original cause of wound was Gradually Appeared. The wound is located on the Right,Posterior Lower Leg. The wound measures 3.9cm length x 5.1cm width x 0.1cm depth; 15.622cm^2 area and 1.562cm^3 volume. 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 The right lower extremity wound is extremely tender and at this stage I'm going to recommend surgical debridement under anesthesia in the OR. She would benefit from this in addition to multiple biopsies from the wound edges to confirm the etiology. Pyoderma gangrenosum is possibly a diagnosis by exclusion. Till then we will continue with Santyl on a daily basis. Plan Wound Cleansing: Wound #2 Left,Distal Lower Leg: Clean wound with Normal Saline. Wound #5 Right,Posterior Lower Leg: Clean wound with Normal Saline. Anesthetic: Wound #2 Left,Distal Lower Leg: Topical Lidocaine 4% cream applied to wound bed prior to debridement Wound #5 Right,Posterior Lower Leg: Topical Lidocaine 4% cream applied to wound bed prior to debridement Primary Wound Dressing: Wound #2 Left,Distal Lower Leg: Other: - siltec sorbact Wound #5 Right,Posterior Lower Leg: Santyl Ointment Secondary Dressing: Wound #5 Right,Posterior Lower Leg: Boardered Foam Dressing Dressing Change Frequency: Wound #2 Left,Distal Lower Leg: Change dressing  every other day. - daily if necessary due to drainage Wound #5 Right,Posterior Lower Leg: Change dressing every other day. - daily if necessary due to drainage Follow-up Appointments: Wound #2 Left,Distal Lower Leg: Yerian, Adrine D. ([id number]) Return Appointment in 1 week. Wound #5 Right,Posterior Lower Leg: Return Appointment in 1 week. Edema Control: Wound #2 Left,Distal Lower Leg: Support Garment 20-30 mm/Hg pressure to: Wound #5 Right,Posterior Lower Leg: Support Garment 20-30 mm/Hg pressure to: Additional Orders / Instructions: Wound #2 Left,Distal Lower Leg: Increase protein intake. Wound #5 Right,Posterior Lower Leg: Increase protein intake. Medications-please add to medication list.: Wound #2 Left,Distal Lower Leg: P.O. Antibiotics - Complete antibiotics prescribed by Dr. Ola Spurr Wound #5 Right,Posterior Lower Leg: P.O. Antibiotics - Complete antibiotics prescribed by Dr. Ola Spurr Services and Therapies ordered were: Surgical Wound Debridement - [facility] Dr Lucky Cowboy referral for sx debridement and biopsy The patient's left lower extremity wound is looking much cleaner and we will continue with Siltec Sorbact back dressings to be changed twice a week if needed. The right lower extremity wound is extremely tender and at this stage I'm going to recommend surgical debridement under anesthesia in the OR. She would benefit from this in addition to multiple biopsies from the wound edges to confirm the etiology. Pyoderma gangrenosum is possibly a diagnosis by exclusion. Till then we will continue with Santyl on a daily basis. Electronic Signature(s) Signed:  08/26/2015 8:09:59 AM By: [name], [name] Previous Signature: 08/25/2015 3:50:29 PM Version By: [name], [name] Entered By: [name] on 08/26/2015 08:09:59 Wallick, Renad D. ([id number]) -------------------------------------------------------------------------------- SuperBill Details Patient Name: ANIJA, BRICKNER  D. Date of Service: 08/25/2015 Medical Record Number: [id number] Patient Account Number: [id number] Date of Birth/Sex: 1946-08-12 (68 y.o. 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 L03.116 Cellulitis of left lower limb Facility Procedures CPT4 Code: [id number] Description: 99214 - WOUND CARE VISIT-LEV 4 EST PT Modifier: Quantity: 1 Physician Procedures CPT4: Description Modifier Quantity Code [id number] 99213 - WC PHYS LEVEL 3 - EST PT 1 ICD-10 Description Diagnosis E11.622 Type 2 diabetes mellitus with other skin ulcer L97.322 Non-pressure chronic ulcer of left ankle with fat layer exposed I89.0  Lymphedema, not elsewhere classified 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

## 2015-09-02 ENCOUNTER — Encounter: Payer: PPO | Attending: Surgery | Admitting: Surgery

## 2015-09-02 ENCOUNTER — Other Ambulatory Visit: Payer: Self-pay | Admitting: Vascular Surgery

## 2015-09-02 DIAGNOSIS — I83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: Secondary | ICD-10-CM | POA: Diagnosis not present

## 2015-09-02 DIAGNOSIS — L03116 Cellulitis of left lower limb: Secondary | ICD-10-CM | POA: Diagnosis not present

## 2015-09-02 DIAGNOSIS — L97322 Non-pressure chronic ulcer of left ankle with fat layer exposed: Secondary | ICD-10-CM | POA: Diagnosis not present

## 2015-09-02 DIAGNOSIS — I890 Lymphedema, not elsewhere classified: Secondary | ICD-10-CM | POA: Insufficient documentation

## 2015-09-02 DIAGNOSIS — I83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: Secondary | ICD-10-CM | POA: Diagnosis not present

## 2015-09-02 DIAGNOSIS — E11622 Type 2 diabetes mellitus with other skin ulcer: Secondary | ICD-10-CM | POA: Diagnosis not present

## 2015-09-02 NOTE — Progress Notes (Signed)
GIBSON, TELLERIA ([id number]) Visit Report for 09/02/2015 Arrival Information Details Patient Name: [name], [name] Date of Service: 09/02/2015 2:45 PM Medical Record Number: [id number] Patient Account Number: [id number] Date of Birth/Sex: 11-04-1945 (68 y.o. Female) Treating RN: [name], RN, [name], [name] Primary Care Physician: Glendon Axe Other Clinician: Referring Physician: Glendon Axe Treating Physician/Extender: Frann Rider in Treatment: 53 Visit Information History Since Last Visit Added or deleted any medications: No Patient Arrived: Cane Any new allergies or adverse reactions: No Arrival Time: 14:44 Had a fall or experienced change in No Accompanied By: grandtr activities of daily living that may affect Transfer Assistance: None risk of falls: Patient Identification Verified: Yes Signs or symptoms of abuse/neglect since last No Secondary Verification Process Yes visito Completed: Hospitalized since last visit: No Patient Has Alerts: Yes Has Dressing in Place as Prescribed: Yes Patient Alerts: Patient on Blood Pain Present Now: No Thinner Electronic Signature(s) Signed: 09/02/2015 2:46:56 PM By: Regan Lemming [name], RN Entered By: Regan Lemming on 09/02/2015 14:46:55 [name], [name] ([id number]) -------------------------------------------------------------------------------- Clinic Level of Care Assessment Details Patient Name: Kristin Stare D. Date of Service: 09/02/2015 2:45 PM Medical Record Number: [id number] Patient Account Number: [id number] Date of Birth/Sex: 14-Oct-1946 (68 y.o. Female) Treating RN: [name], RN, [name], [place] Primary Care Physician: Glendon Axe Other Clinician: Referring Physician: Glendon Axe Treating Physician/Extender: Frann Rider in Treatment: 30 Clinic Level of Care Assessment Items TOOL 4 Quantity Score []  - Use when only an EandM is performed on FOLLOW-UP visit 0 ASSESSMENTS - Nursing Assessment / Reassessment X -  Reassessment of Co-morbidities (includes updates in patient status) 1 10 X - Reassessment of Adherence to Treatment Plan 1 5 ASSESSMENTS - Wound and Skin Assessment / Reassessment X - Simple Wound Assessment / Reassessment - one wound 1 5 []  - Complex Wound Assessment / Reassessment - multiple wounds 0 []  - Dermatologic / Skin Assessment (not related to wound area) 0 ASSESSMENTS - Focused Assessment []  - Circumferential Edema Measurements - multi extremities 0 []  - Nutritional Assessment / Counseling / Intervention 0 []  - Lower Extremity Assessment (monofilament, tuning fork, pulses) 0 []  - Peripheral Arterial Disease Assessment (using hand held doppler) 0 ASSESSMENTS - Ostomy and/or Continence Assessment and Care []  - Incontinence Assessment and Management 0 []  - Ostomy Care Assessment and Management (repouching, etc.) 0 PROCESS - Coordination of Care X - Simple Patient / Family Education for ongoing care 1 15 []  - Complex (extensive) Patient / Family Education for ongoing care 0 []  - Staff obtains Programmer, systems, Records, Test Results / Process Orders 0 []  - Staff telephones HHA, Nursing Homes / Clarify orders / etc 0 []  - Routine Transfer to another Facility (non-emergent condition) 0 [name], [name] ([id number]) []  - Routine Hospital Admission (non-emergent condition) 0 []  - New Admissions / Biomedical engineer / Ordering NPWT, Apligraf, etc. 0 []  - Emergency Hospital Admission (emergent condition) 0 []  - Simple Discharge Coordination 0 []  - Complex (extensive) Discharge Coordination 0 PROCESS - Special Needs []  - Pediatric / Minor Patient Management 0 []  - Isolation Patient Management 0 []  - Hearing / Language / Visual special needs 0 []  - Assessment of Community assistance (transportation, D/C planning, etc.) 0 []  - Additional assistance / Altered mentation 0 []  - Support Surface(s) Assessment (bed, cushion, seat, etc.) 0 INTERVENTIONS - Wound Cleansing / Measurement []  - Simple  Wound Cleansing - one wound 0 X - Complex Wound Cleansing - multiple wounds 2 5 X - Wound Imaging (photographs - any number of  wounds) 1 5 []  - Wound Tracing (instead of photographs) 0 []  - Simple Wound Measurement - one wound 0 X - Complex Wound Measurement - multiple wounds 2 5 INTERVENTIONS - Wound Dressings X - Small Wound Dressing one or multiple wounds 2 10 []  - Medium Wound Dressing one or multiple wounds 0 []  - Large Wound Dressing one or multiple wounds 0 []  - Application of Medications - topical 0 []  - Application of Medications - injection 0 INTERVENTIONS - Miscellaneous []  - External ear exam 0 Tall, [name] ([id number]) []  - Specimen Collection (cultures, biopsies, blood, body fluids, etc.) 0 []  - Specimen(s) / Culture(s) sent or taken to Lab for analysis 0 []  - Patient Transfer (multiple staff / [name]) 0 []  - Simple Staple / Suture removal (25 or less) 0 []  - Complex Staple / Suture removal (26 or more) 0 []  - Hypo / Hyperglycemic Management (close monitor of Blood Glucose) 0 []  - Ankle / Brachial Index (ABI) - do not check if billed separately 0 X - Vital Signs 1 5 Has the patient been seen at the hospital within the last three years: Yes Total Score: 85 Level Of Care: New/Established - Level 3 Electronic Signature(s) Signed: 09/02/2015 3:18:49 PM By: Regan Lemming [name], RN Entered By: Regan Lemming on 09/02/2015 15:18:48 [name], [name] ([id number]) -------------------------------------------------------------------------------- Encounter Discharge Information Details Patient Name: [name], [name] Date of Service: 09/02/2015 2:45 PM Medical Record Number: [id number] Patient Account Number: [id number] Date of Birth/Sex: Oct 30, 1945 (68 y.o. Female) Treating RN: Baruch Gouty, RN, [name], [name] Primary Care Physician: Glendon Axe Other Clinician: Referring Physician: Glendon Axe Treating Physician/Extender: Frann Rider in Treatment: 30 Encounter  Discharge Information Items Discharge Pain Level: 0 Discharge Condition: Stable Ambulatory Status: Cane Discharge Destination: Home Transportation: Private Auto Accompanied By: grddtr Schedule Follow-up Appointment: No Medication Reconciliation completed No and provided to Patient/Care Shariya Gaster: Provided on Clinical Summary of Care: 09/02/2015 Form Type Recipient Paper Patient LM Electronic Signature(s) Signed: 09/02/2015 3:20:44 PM By: Regan Lemming [name], RN Previous Signature: 09/02/2015 3:19:40 PM Version By: Ruthine Dose Entered By: Regan Lemming on 09/02/2015 15:20:44 Gainor, [name] ([id number]) -------------------------------------------------------------------------------- Lower Extremity Assessment Details Patient Name: Kristin Stare D. Date of Service: 09/02/2015 2:45 PM Medical Record Number: [id number] Patient Account Number: [id number] Date of Birth/Sex: 1945-12-17 (68 y.o. Female) Treating RN: [name], RN, [name], [place] Primary Care Physician: Glendon Axe Other Clinician: Referring Physician: Glendon Axe Treating Physician/Extender: Frann Rider in Treatment: 30 Edema Assessment Assessed: [Left: No] [Right: No] E[Left: dema] [Right: :] Calf Left: Right: Point of Measurement: 30 cm From Medial Instep 35.5 cm 34.5 cm Ankle Left: Right: Point of Measurement: 9 cm From Medial Instep 25 cm 25 cm Vascular Assessment Claudication: Claudication Assessment [Left:None] [Right:None] Pulses: Posterior Tibial Dorsalis Pedis Palpable: [Left:Yes] [Right:Yes] Extremity colors, hair growth, and conditions: Extremity Color: [Left:Mottled] [Right:Mottled] Hair Growth on Extremity: [Left:Yes] [Right:Yes] Temperature of Extremity: [Left:Warm] [Right:Warm] Capillary Refill: [Left:< 3 seconds] [Right:< 3 seconds] Toe Nail Assessment Left: Right: Thick: No No Discolored: Yes Yes Deformed: No No Improper Length and Hygiene: No No Electronic Signature(s) Signed: 09/02/2015  2:54:21 PM By: Regan Lemming [name], RN Entered By: Regan Lemming on 09/02/2015 14:54:21 Lehrman, Sheketa D. ([id number]) Toruno, Sanae D. ([id number]) -------------------------------------------------------------------------------- Multi Wound Chart Details Patient Name: Garro, Mackenzey D. Date of Service: 09/02/2015 2:45 PM Medical Record Number: [id number] Patient Account Number: [id number] Date of Birth/Sex: 1946/05/23 (68 y.o. Female) Treating RN: [name], RN, [name], [employer] Primary Care Physician: Glendon Axe Other  Clinician: Referring Physician: Glendon Axe Treating Physician/Extender: Frann Rider in Treatment: 30 Vital Signs Height(in): 65 Pulse(bpm): 70 Weight(lbs): 248 Blood Pressure 157/70 (mmHg): Body Mass Index(BMI): 41 Temperature(F): 98.3 Respiratory Rate 17 (breaths/min): Photos: [2:No Photos] [5:No Photos] [N/A:N/A] Wound Location: [2:Left Lower Leg - Distal Right Lower Leg -] [5:Posterior] [N/A:N/A] Wounding Event: [2:Blister] [5:Gradually Appeared] [N/A:N/A] Primary Etiology: [2:Venous Leg Ulcer] [5:Venous Leg Ulcer] [N/A:N/A] Comorbid History: [2:Cataracts, Asthma, Coronary Artery Disease, Coronary Artery Disease, Hypertension, Type II Diabetes, Osteoarthritis, Diabetes, Osteoarthritis, Neuropathy] [5:Cataracts, Asthma, Hypertension, Type II Neuropathy] [N/A:N/A] Date Acquired: [2:12/30/2014] [5:04/29/2015] [N/A:N/A] Weeks of Treatment: [2:30] [5:15] [N/A:N/A] Wound Status: [2:Open] [5:Open] [N/A:N/A] Clustered Wound: [2:Yes] [5:No] [N/A:N/A] Measurements L x W x D 5x5.5x0.2 [5:5x5.5x0.1] [N/A:N/A] (cm) Area (cm) : [2:21.598] [5:21.598] [N/A:N/A] Volume (cm) : [2:4.32] [5:2.16] [N/A:N/A] % Reduction in Area: [2:-2954.90%] [5:-11388.30%] [N/A:N/A] % Reduction in Volume: -5984.50% [5:-11268.40%] [N/A:N/A] Classification: [2:Full Thickness Without Exposed Support Structures] [5:Full Thickness Without Exposed Support Structures] [N/A:N/A] HBO Classification: [2:Grade  1] [5:Grade 3] [N/A:N/A] Wagner Verification: [2:N/A] [5:X-Ray] [N/A:N/A] Exudate Amount: [2:Large] [5:Small] [N/A:N/A] Exudate Type: [2:Serous] [5:Serosanguineous] [N/A:N/A] Exudate Color: [2:amber] [5:red, brown] [N/A:N/A] Wound Margin: [2:Distinct, outline attached Indistinct, nonvisible] [N/A:N/A] Granulation Amount: [2:Large (67-100%)] [5:None Present (0%)] [N/A:N/A] Granulation Quality: Red, Pink, Hyper- N/A N/A granulation Necrotic Amount: Small (1-33%) Large (67-100%) N/A Exposed Structures: Fascia: No Fascia: No N/A Fat: No Fat: No Tendon: No Tendon: No Muscle: No Muscle: No Joint: No Joint: No Bone: No Bone: No Limited to Skin Limited to Skin Breakdown Breakdown Epithelialization: Small (1-33%) N/A N/A Periwound Skin Texture: Scarring: Yes Edema: Yes N/A Edema: No Excoriation: No Excoriation: No Induration: No Induration: No Callus: No Callus: No Crepitus: No Crepitus: No Fluctuance: No Fluctuance: No Friable: No Friable: No Rash: No Rash: No Scarring: No Periwound Skin Moist: Yes Moist: Yes N/A Moisture: Maceration: No Maceration: No Dry/Scaly: No Dry/Scaly: No Periwound Skin Color: Hemosiderin Staining: Yes Atrophie Blanche: No N/A Atrophie Blanche: No Cyanosis: No Cyanosis: No Ecchymosis: No Ecchymosis: No Erythema: No Erythema: No Hemosiderin Staining: No Mottled: No Mottled: No Pallor: No Pallor: No Rubor: No Rubor: No Temperature: No Abnormality No Abnormality N/A Tenderness on Yes Yes N/A Palpation: Wound Preparation: Ulcer Cleansing: Ulcer Cleansing: N/A Rinsed/Irrigated with Rinsed/Irrigated with Saline Saline Topical Anesthetic Topical Anesthetic Applied: Other: lidocaine Applied: Other: lidocaine 4% 4% Treatment Notes Electronic Signature(s) Signed: 09/02/2015 3:04:28 PM By: Regan Lemming [name], RN Entered By: Regan Lemming on 09/02/2015 15:04:27 Shedlock, [name] ([id number]) Hollibaugh, [place].  ([id number]) -------------------------------------------------------------------------------- Multi-Disciplinary Care Plan Details Patient Name: JELINA, PAULSEN D. Date of Service: 09/02/2015 2:45 PM Medical Record Number: [id number] Patient Account Number: [id number] Date of Birth/Sex: 28-Aug-1946 (68 y.o. Female) Treating RN: [name], RN, [name], [name] Primary Care Physician: Glendon Axe Other Clinician: Referring Physician: Glendon Axe Treating Physician/Extender: Frann Rider in Treatment: 30 Active Inactive Orientation to the Wound Care Program Nursing Diagnoses: Knowledge deficit related to the wound healing center program Goals: Patient/caregiver will verbalize understanding of the [place] Program Date Initiated: 01/31/2015 Goal Status: Active Interventions: Provide education on orientation to the wound center Notes: Venous Leg Ulcer Nursing Diagnoses: Potential for venous Insuffiency (use before diagnosis confirmed) Goals: Non-invasive venous studies are completed as ordered Date Initiated: 01/31/2015 Goal Status: Active Patient/caregiver will verbalize understanding of disease process and disease management Date Initiated: 01/31/2015 Goal Status: Active Interventions: Assess peripheral edema status every visit. Notes: Wound/Skin Impairment Nursing Diagnoses: Impaired tissue integrity Knowledge deficit related to smoking impact on wound healing Cazier, Edwardine D. ([id number])  Goals: Patient/caregiver will verbalize understanding of skin care regimen Date Initiated: 01/31/2015 Goal Status: Active Ulcer/skin breakdown will heal within 14 weeks Date Initiated: 01/31/2015 Goal Status: Active Interventions: Assess ulceration(s) every visit Notes: Electronic Signature(s) Signed: 09/02/2015 3:04:06 PM By: Regan Lemming [name], RN Entered By: Regan Lemming on 09/02/2015 15:04:06 Dimitrov, Hend D.  ([id number]) -------------------------------------------------------------------------------- Pain Assessment Details Patient Name: Kristin Stare D. Date of Service: 09/02/2015 2:45 PM Medical Record Number: [id number] Patient Account Number: [id number] Date of Birth/Sex: 1946/05/12 (68 y.o. Female) Treating RN: Baruch Gouty, RN, [name], [name] Primary Care Physician: Glendon Axe Other Clinician: Referring Physician: Glendon Axe Treating Physician/Extender: Frann Rider in Treatment: 30 Active Problems Location of Pain Severity and Description of Pain Patient Has Paino No Site Locations Pain Management and Medication Current Pain Management: Electronic Signature(s) Signed: 09/02/2015 2:47:01 PM By: Regan Lemming [name], RN Entered By: Regan Lemming on 09/02/2015 14:47:01 Mucci, Maniya D. ([id number]) -------------------------------------------------------------------------------- Patient/Caregiver Education Details Patient Name: DANYA, SPEARMAN D. Date of Service: 09/02/2015 2:45 PM Medical Record Number: [id number] Patient Account Number: [id number] Date of Birth/Gender: 08-11-1946 (68 y.o. Female) Treating RN: [name], RN, [name], [name] Primary Care Physician: Glendon Axe Other Clinician: Referring Physician: Glendon Axe Treating Physician/Extender: Frann Rider in Treatment: 30 Education Assessment Education Provided To: Patient Education Topics Provided Welcome To [place]: Methods: Explain/Verbal Responses: State content correctly Electronic Signature(s) Signed: 09/02/2015 3:20:53 PM By: Regan Lemming [name], RN Entered By: Regan Lemming on 09/02/2015 15:20:53 Osentoski, [name] ([id number]) -------------------------------------------------------------------------------- Wound Assessment Details Patient Name: [name], [name] Date of Service: 09/02/2015 2:45 PM Medical Record Number: [id number] Patient Account Number: [id number] Date of Birth/Sex: 06-03-1946 (68 y.o.  Female) Treating RN: [name], RN, [name], [name] Primary Care Physician: Glendon Axe Other Clinician: Referring Physician: Glendon Axe Treating Physician/Extender: Frann Rider in Treatment: 30 Wound Status Wound Number: 2 Primary Venous Leg Ulcer Etiology: Wound Location: Left Lower Leg - Distal Wound Open Wounding Event: Blister Status: Date Acquired: 12/30/2014 Comorbid Cataracts, Asthma, Coronary Artery Weeks Of Treatment: 30 History: Disease, Hypertension, Type II Clustered Wound: Yes Diabetes, Osteoarthritis, Neuropathy Photos Photo Uploaded By: Regan Lemming on 09/02/2015 16:33:41 Wound Measurements Length: (cm) 5 Width: (cm) 5.5 Depth: (cm) 0.2 Area: (cm) 21.598 Volume: (cm) 4.32 % Reduction in Area: -2954.9% % Reduction in Volume: -5984.5% Epithelialization: Small (1-33%) Tunneling: No Undermining: No Wound Description Full Thickness Without Classification: Exposed Support Structures Diabetic Severity Grade 1 (Wagner): Wound Margin: Distinct, outline attached Exudate Amount: Large Exudate Type: Serous Exudate Color: amber Foul Odor After Cleansing: No Wound Bed Granulation Amount: Large (67-100%) Exposed Structure Bastyr, [name] ([id number]) Granulation Quality: Red, Pink, Hyper-granulation Fascia Exposed: No Necrotic Amount: Small (1-33%) Fat Layer Exposed: No Necrotic Quality: Adherent Slough Tendon Exposed: No Muscle Exposed: No Joint Exposed: No Bone Exposed: No Limited to Skin Breakdown Periwound Skin Texture Texture Color No Abnormalities Noted: No No Abnormalities Noted: No Callus: No Atrophie Blanche: No Crepitus: No Cyanosis: No Excoriation: No Ecchymosis: No Fluctuance: No Erythema: No Friable: No Hemosiderin Staining: Yes Induration: No Mottled: No Localized Edema: No Pallor: No Rash: No Rubor: No Scarring: Yes Temperature / Pain Moisture Temperature: No Abnormality No Abnormalities Noted: No Tenderness on  Palpation: Yes Dry / Scaly: No Maceration: No Moist: Yes Wound Preparation Ulcer Cleansing: Rinsed/Irrigated with Saline Topical Anesthetic Applied: Other: lidocaine 4%, Treatment Notes Wound #2 (Left, Distal Lower Leg) 4. Dressing Applied: Other dressing (specify in notes) 5. Secondary Dressing Applied Bordered Foam Dressing 6. Footwear/Offloading device applied Compression stockings Notes Siltec sorbact,  hydrogel BFD Electronic Signature(s) Signed: 09/02/2015 2:57:50 PM By: Regan Lemming [name], RN Entered By: Regan Lemming on 09/02/2015 14:57:50 [name], [name] ([id number]) -------------------------------------------------------------------------------- Wound Assessment Details Patient Name: [name], [name] Date of Service: 09/02/2015 2:45 PM Medical Record Number: [id number] Patient Account Number: [id number] Date of Birth/Sex: Mar 26, 1946 (68 y.o. Female) Treating RN: [name], RN, [name], [name] Primary Care Physician: Glendon Axe Other Clinician: Referring Physician: Glendon Axe Treating Physician/Extender: Frann Rider in Treatment: 30 Wound Status Wound Number: 5 Primary Venous Leg Ulcer Etiology: Wound Location: Right Lower Leg - Posterior Wound Open Wounding Event: Gradually Appeared Status: Date Acquired: 04/29/2015 Comorbid Cataracts, Asthma, Coronary Artery Weeks Of Treatment: 15 History: Disease, Hypertension, Type II Clustered Wound: No Diabetes, Osteoarthritis, Neuropathy Photos Photo Uploaded By: Regan Lemming on 09/02/2015 16:34:00 Wound Measurements Length: (cm) 5 Width: (cm) 5.5 Depth: (cm) 0.1 Area: (cm) 21.598 Volume: (cm) 2.16 % Reduction in Area: -11388.3% % Reduction in Volume: -11268.4% Tunneling: No Undermining: No Wound Description Full Thickness Without Exposed Foul Odor A Classification: Support Structures Diabetic Severity Grade 3 (Wagner): Wagner X-Ray Verification: Wound Margin: Indistinct, nonvisible Exudate Amount:  Small Exudate Type: Serosanguineous Exudate Color: red, brown Thornell, [name] ([id number]) fter Cleansing: No Wound Bed Granulation Amount: None Present (0%) Exposed Structure Necrotic Amount: Large (67-100%) Fascia Exposed: No Necrotic Quality: Adherent Slough Fat Layer Exposed: No Tendon Exposed: No Muscle Exposed: No Joint Exposed: No Bone Exposed: No Limited to Skin Breakdown Periwound Skin Texture Texture Color No Abnormalities Noted: No No Abnormalities Noted: No Callus: No Atrophie Blanche: No Crepitus: No Cyanosis: No Excoriation: No Ecchymosis: No Fluctuance: No Erythema: No Friable: No Hemosiderin Staining: No Induration: No Mottled: No Localized Edema: Yes Pallor: No Rash: No Rubor: No Scarring: No Temperature / Pain Moisture Temperature: No Abnormality No Abnormalities Noted: No Tenderness on Palpation: Yes Dry / Scaly: No Maceration: No Moist: Yes Wound Preparation Ulcer Cleansing: Rinsed/Irrigated with Saline Topical Anesthetic Applied: Other: lidocaine 4%, Treatment Notes Wound #5 (Right, Posterior Lower Leg) 1. Cleansed with: Clean wound with Normal Saline 4. Dressing Applied: Santyl Ointment 5. Secondary Dressing Applied Bordered Foam Dressing 6. Footwear/Offloading device applied Compression stockings Electronic Signature(s) Signed: 09/02/2015 2:58:45 PM By: Regan Lemming [name], RN Entered By: Regan Lemming on 09/02/2015 14:58:44 [name], [name] ([id number]) Ciaramitaro, Nicie D. ([id number]) -------------------------------------------------------------------------------- Vitals Details Patient Name: Kristin Stare D. Date of Service: 09/02/2015 2:45 PM Medical Record Number: [id number] Patient Account Number: [id number] Date of Birth/Sex: 1946/02/28 (68 y.o. Female) Treating RN: [name], RN, [name], [place] Primary Care Physician: Glendon Axe Other Clinician: Referring Physician: Glendon Axe Treating Physician/Extender: Frann Rider in  Treatment: 30 Vital Signs Time Taken: 14:47 Temperature (F): 98.3 Height (in): 65 Pulse (bpm): 70 Weight (lbs): 248 Respiratory Rate (breaths/min): 17 Body Mass Index (BMI): 41.3 Blood Pressure (mmHg): 157/70 Reference Range: 80 - 120 mg / dl Electronic Signature(s) Signed: 09/02/2015 2:47:50 PM By: Regan Lemming [name], RN Entered By: Regan Lemming on 09/02/2015 14:47:50

## 2015-09-03 ENCOUNTER — Encounter
Admission: RE | Admit: 2015-09-03 | Discharge: 2015-09-03 | Disposition: A | Discharge status: Home / Self Care | Payer: PPO | Source: Ambulatory Visit | Attending: Vascular Surgery | Admitting: Vascular Surgery

## 2015-09-03 DIAGNOSIS — Z1611 Resistance to penicillins: Secondary | ICD-10-CM | POA: Diagnosis not present

## 2015-09-03 DIAGNOSIS — E119 Type 2 diabetes mellitus without complications: Secondary | ICD-10-CM | POA: Diagnosis not present

## 2015-09-03 DIAGNOSIS — Z79899 Other long term (current) drug therapy: Secondary | ICD-10-CM | POA: Diagnosis not present

## 2015-09-03 DIAGNOSIS — B964 Proteus (mirabilis) (morganii) as the cause of diseases classified elsewhere: Secondary | ICD-10-CM | POA: Diagnosis not present

## 2015-09-03 DIAGNOSIS — I10 Essential (primary) hypertension: Secondary | ICD-10-CM | POA: Diagnosis not present

## 2015-09-03 DIAGNOSIS — L0889 Other specified local infections of the skin and subcutaneous tissue: Secondary | ICD-10-CM | POA: Diagnosis not present

## 2015-09-03 DIAGNOSIS — Z794 Long term (current) use of insulin: Secondary | ICD-10-CM | POA: Diagnosis not present

## 2015-09-03 DIAGNOSIS — Z8249 Family history of ischemic heart disease and other diseases of the circulatory system: Secondary | ICD-10-CM | POA: Diagnosis not present

## 2015-09-03 DIAGNOSIS — Z881 Allergy status to other antibiotic agents status: Secondary | ICD-10-CM | POA: Diagnosis not present

## 2015-09-03 DIAGNOSIS — B9620 Unspecified Escherichia coli [E. coli] as the cause of diseases classified elsewhere: Secondary | ICD-10-CM | POA: Diagnosis not present

## 2015-09-03 DIAGNOSIS — Z880 Allergy status to penicillin: Secondary | ICD-10-CM | POA: Diagnosis not present

## 2015-09-03 DIAGNOSIS — B965 Pseudomonas (aeruginosa) (mallei) (pseudomallei) as the cause of diseases classified elsewhere: Secondary | ICD-10-CM | POA: Diagnosis not present

## 2015-09-03 DIAGNOSIS — Z7982 Long term (current) use of aspirin: Secondary | ICD-10-CM | POA: Diagnosis not present

## 2015-09-03 DIAGNOSIS — E7800 Pure hypercholesterolemia, unspecified: Secondary | ICD-10-CM | POA: Diagnosis not present

## 2015-09-03 DIAGNOSIS — S81801A Unspecified open wound, right lower leg, initial encounter: Secondary | ICD-10-CM | POA: Diagnosis present

## 2015-09-03 DIAGNOSIS — Z8041 Family history of malignant neoplasm of ovary: Secondary | ICD-10-CM | POA: Diagnosis not present

## 2015-09-03 DIAGNOSIS — L97213 Non-pressure chronic ulcer of right calf with necrosis of muscle: Secondary | ICD-10-CM | POA: Diagnosis not present

## 2015-09-03 DIAGNOSIS — Z7984 Long term (current) use of oral hypoglycemic drugs: Secondary | ICD-10-CM | POA: Diagnosis not present

## 2015-09-03 DIAGNOSIS — N289 Disorder of kidney and ureter, unspecified: Secondary | ICD-10-CM | POA: Diagnosis not present

## 2015-09-03 DIAGNOSIS — Z833 Family history of diabetes mellitus: Secondary | ICD-10-CM | POA: Diagnosis not present

## 2015-09-03 DIAGNOSIS — B961 Klebsiella pneumoniae [K. pneumoniae] as the cause of diseases classified elsewhere: Secondary | ICD-10-CM | POA: Diagnosis not present

## 2015-09-03 DIAGNOSIS — I8391 Asymptomatic varicose veins of right lower extremity: Secondary | ICD-10-CM | POA: Diagnosis not present

## 2015-09-03 DIAGNOSIS — Z90710 Acquired absence of both cervix and uterus: Secondary | ICD-10-CM | POA: Diagnosis not present

## 2015-09-03 DIAGNOSIS — Z882 Allergy status to sulfonamides status: Secondary | ICD-10-CM | POA: Diagnosis not present

## 2015-09-03 DIAGNOSIS — Z888 Allergy status to other drugs, medicaments and biological substances status: Secondary | ICD-10-CM | POA: Diagnosis not present

## 2015-09-03 LAB — CBC WITH DIFFERENTIAL/PLATELET
Basophils Absolute: 0.1 10*3/uL (ref 0–0.1)
Basophils Relative: 1 %
EOS ABS: 0.3 10*3/uL (ref 0–0.7)
Eosinophils Relative: 3 %
HCT: 29.8 % — ABNORMAL LOW (ref 35.0–47.0)
HEMOGLOBIN: 9.7 g/dL — AB (ref 12.0–16.0)
Lymphocytes Relative: 15 %
Lymphs Abs: 1.6 10*3/uL (ref 1.0–3.6)
MCH: 29 pg (ref 26.0–34.0)
MCHC: 32.6 g/dL (ref 32.0–36.0)
MCV: 89 fL (ref 80.0–100.0)
Monocytes Absolute: 1.2 10*3/uL — ABNORMAL HIGH (ref 0.2–0.9)
Monocytes Relative: 11 %
NEUTROS PCT: 70 %
Neutro Abs: 7.5 10*3/uL — ABNORMAL HIGH (ref 1.4–6.5)
PLATELETS: 268 10*3/uL (ref 150–440)
RBC: 3.35 MIL/uL — AB (ref 3.80–5.20)
RDW: 13.9 % (ref 11.5–14.5)
WBC: 10.7 10*3/uL (ref 3.6–11.0)

## 2015-09-03 LAB — BASIC METABOLIC PANEL
Anion gap: 7 (ref 5–15)
BUN: 32 mg/dL — ABNORMAL HIGH (ref 6–20)
CALCIUM: 9.4 mg/dL (ref 8.9–10.3)
CO2: 26 mmol/L (ref 22–32)
CREATININE: 1.6 mg/dL — AB (ref 0.44–1.00)
Chloride: 111 mmol/L (ref 101–111)
GFR calc Af Amer: 37 mL/min — ABNORMAL LOW (ref >60)
GFR calc non Af Amer: 32 mL/min — ABNORMAL LOW (ref >60)
GLUCOSE: 69 mg/dL (ref 65–99)
Potassium: 3.6 mmol/L (ref 3.5–5.1)
Sodium: 144 mmol/L (ref 135–145)

## 2015-09-03 LAB — SURGICAL PCR SCREEN
MRSA, PCR: NEGATIVE
STAPHYLOCOCCUS AUREUS: NEGATIVE

## 2015-09-03 LAB — APTT: APTT: 32 s (ref 24–36)

## 2015-09-03 LAB — TYPE AND SCREEN
ABO/RH(D): A POS
ANTIBODY SCREEN: NEGATIVE

## 2015-09-03 LAB — ABO/RH: ABO/RH(D): A POS

## 2015-09-03 LAB — PROTIME-INR
INR: 1.1
PROTHROMBIN TIME: 14.4 s (ref 11.4–15.0)

## 2015-09-03 NOTE — Progress Notes (Signed)
DANAIJA, ESKRIDGE ([id number]) Visit Report for 09/02/2015 Chief Complaint Document Details Patient Name: [name], [name] Date of Service: 09/02/2015 2:45 PM Medical Record Patient Account Number: [id number] Number: [name], RN, [name], Treating RN: 1945-11-15 (69 y.o. [name] Date of Birth/Sex: Female) Other Clinician: Primary Care Physician: [facility], Delana Meyer Treating [name] Referring Physician: Glendon Axe Physician/Extender: Weeks in Treatment: 30 Information Obtained from: Patient Chief Complaint Patient presents to the wound care center for a consult due non healing wound 69 year old patient comes with a history of having a ulcer on the left lower extremity for the past 4 weeks. she says she's had swelling of both lower extremities for about a year after she started having prednisone. 02/07/2015 -- her vascular appointments obtained were in the first and third week of June. she is able to go to [place] and we will try and get her some earlier appointments. Other than that nothing else has changed in her management. Electronic Signature(s) Signed: 09/02/2015 3:10:29 PM By: [name], [name] Entered By: [name] on 09/02/2015 15:10:29 Inskeep, Concetta D. ([id number]) -------------------------------------------------------------------------------- HPI Details Patient Name: Kristin, [name] Date of Service: 09/02/2015 2:45 PM Medical Record Patient Account Number: [id number] Number: [name], RN, [name], Treating RN: 07-08-1946 (69 y.o. [name] Date of Birth/Sex: Female) Other Clinician: Primary Care Physician: [facility], Delana Meyer Treating Davis Ambrosini Referring Physician: Glendon Axe Physician/Extender: Weeks in Treatment: 30 History of Present Illness HPI Description: 69 year old patient who is known to have diabetes mellitus type 2, chronic renal insufficiency, coronary artery disease, hypertension, hypercholesterolemia, temporal arteritis and inflammatory  arthritiss also has a history of having a hysterectomy and some orthopedic related surgeries. The ulcer on the left lower extremity started off as a blister and then. Got progressively worse. She does not have any fever or chills and has not had any recent surgical intervention for this. Her last hemoglobin A1c was 10.1 in September 2015. She has been recently put on doxycycline by her PCP. She is now also allergic to doxycycline and was this was changed over to Keflex. 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 She does see a cardiologist and also takes a diuretic. 02/07/2015 her arterial and venous duplex studies to be done have dates been given as the first and third week of June. This is at [facility] We are going to try and get early appointments at [facility]. other than that nothing has changed in her management. 02/14/2015 -- we have been able to get her an appointment in [facility] on May 20 which is much earlier than her previous ones at [place]. She continues with her prednisone and her sugars are in the range of 150-200. 02/21/2015 We were able to get a vascular lab workup for her today and she is going to be there at 2:00 this afternoon. the swelling of her leg has gone down significantly but she still has some tenderness over the wounds. 02/28/2015 - She has had one of two vascular workups done, and this coming Tuesday has another, at [facility]. She continues to be on steroid medications. She has significant sensitivity in her left lower extremity and has pain suggestive of neuropathic pain and I have asked her to address this with her primary care physician. 03/07/2015 -- The patient saw Dr. Lucky Cowboy for a consultation and he has had her arteries are okay but she has 2  incompetent veins on the left lower extremity and he is going to set her up  for surgery. Official report is awaited. Addendum: Official reports are now available and on 03/04/2015. She was seen and lower extremity venous duplex exam was done. There was reflux present within the left greater saphenous vein below the knee and also the left small saphenous vein. Arterial duplex showed normal triphasic waveforms throughout the left lower extremity without any significant stenosis. Her ABIs were noncompressible bilaterally but a waveforms were normal and a digital pressures were normal bilaterally consistent with no significant arterial insufficiency. He has recommended endovenous ablation of both the left small saphenous and the left great saphenous vein. This would still be scheduled later. 03/14/2015 -- she has heard back from the vascular office and has surgery scheduled for sometime in July. JEILANI, [name] ([id number]) Her rheumatologist has decreased her prednisone dosage but she still on it. She has also had cataract surgery in her right eye recently this week. 03/20/2015 - No new complaints today. Pain improved. No fever or chills. Tolerating 2 layer compression. 04/14/2015 -- she was doing very well today she went off on vacation and now her edema has increased markedly the ulceration is bigger and her diabetes is not under control. 04/21/2015 -- I spoke to her PCP Dr. Candiss Norse and discussed the management which would include being seen by a general surgeon for debridement and taking multiple punch biopsies which would help in establishing the diagnosis of this is a vasculitis. She is agreeable about this and will set her up for the procedure with Dr. Tamala Julian at [facility]. She was seen by the surgeon Dr. Jamal Collin. 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 She is scheduled to have laser ablation done next week.Pt was sent here for possible biopsy to  look for vasculitis. Feel it would be better to wait after laser ablation is completed- the ulcer may heal fully and biopsy may not be necessary 04/29/2015 -- she had the venous ablation done by Dr. Lucky Cowboy last Friday and we do not have any notes yet. She is doing fine otherwise. 05/06/2015 --Review of her recent vascular intervention shows that she was seen by Dr. Lucky Cowboy on 04/29/2015. The follow-up duplex which was done showed that both the great saphenous vein and the small saphenous vein remained patent with reflux consistent with an unsuccessful ablation. He has rescheduled her for another the endovenous ablation to be done in about 4 weekso time. 05/13/2015 -- he was seen by her surgeon Dr. Jamal Collin who asked her to continue with conservative therapy and he would speak to Dr. Lucky Cowboy about her management. Dr. Lucky Cowboy is going to schedule her surgery in the middle of August for a repeat endovenous ablation. Her pus culture from last week has grown : [place] her noted her sensitivity report but due to her multiple allergies I had tried clindamycin and she developed a rash with this too. She has been prescribed and anti-buttocks in the ER and is has it at home and she will let is know what she is going to be taking. 05/20/2015 -- she has developed a small spot on her right lower extremity but besides that it is not a full fledged ulceration. She did not get to see Dr. Lucky Cowboy last week and hopefully she will see him in the near future. 05/27/2015 -she  is still awaiting her appointment with Dr.Dew and her vascular procedure is not scheduled until August 19. She will be seeing her PCP tomorrow and I have asked her to convey our discussion so that she is aware that debridement has not been done yet. 06/03/2015 -- was seen by her rheumatologist Dr. Dorthula Matas, who has been treating her for temporal  arteritis and in his note has mentioned the possibility of vasculitis or pyoderma gangrenosum. He is lowering her prednisone to 12-1/2 mg for 1 month and then 10 mg per the next month. I will again make an attempt to speak to her PCP Dr. Candiss Norse and her surgeon Dr. 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 06/17/2015 -- Dr.Dew did her vascular procedure last week and a follow-up venous ultrasound shows good resolution of the veins as per the patient's history. He is to see her back in 2 weeks. EMILYANNE, MCGOUGH ([id number]) 07/07/2015. -- the patient has had a heavy growth of Proteus mirabilis and Enterococcus faecalis. These are sensitive to several drugs but the problem is she has allergies to all of these and hence I would like her to see Dr. Ola Spurr for this. She is also due to see Dr. Lucky Cowboy tomorrow and I will discussed the management with him including debridement under anesthesia and possible biopsies. 07/14/2015 -- she has an appointment to see Dr. Ola Spurr tomorrow and did see Dr. Bunnie Domino PA who will discuss my request with him. 07/21/2015 -- saw Dr. Ola Spurr was able to do a test on her and has put her on amoxicillin. She has been tolerating that and has had no problems with allergies to this. 07/28/2015 -- Last Friday I spoke to Dr. Leotis Pain regarding her care and he said that her right leg did not need any surgery and on the left leg was doing pretty good. We did agree that if she undergoes any procedure in the future he would do a couple of punch biopsies of the wound. 08/18/2015 -- her right leg is very tender and there is significant amount of slough. The left leg is looking much better 09/02/2015 -- she is going to have a debridement and punch biopsies of her right lower extremity by Dr. Leotis Pain this coming Thursday. Also seen Dr. Ola Spurr who has continued her on  ciprofloxacin. Electronic Signature(s) Signed: 09/02/2015 3:11:39 PM By: [name], [name] Entered By: [name] on 09/02/2015 15:11:39 Kun, Deniah DMarland Kitchen ([id number]) -------------------------------------------------------------------------------- Physical Exam Details Patient Name: HEELA, HEISHMAN D. Date of Service: 09/02/2015 2:45 PM Medical Record Patient Account Number: [id number] Number: [name], RN, [name], Treating RN: 1946-09-24 (69 y.o. [name] Date of Birth/Sex: Female) Other Clinician: Primary Care Physician: [facility], Delana Meyer Treating [name] Referring Physician: Glendon Axe Physician/Extender: Weeks in Treatment: 30 Constitutional . Pulse regular. Respirations normal and unlabored. Afebrile. . Eyes Nonicteric. Reactive to light. Ears, Nose, Mouth, and Throat Lips, teeth, and gums WNL.Marland Kitchen Moist mucosa without lesions . Neck supple and nontender. No palpable supraclavicular or cervical adenopathy. Normal sized without goiter. Respiratory WNL. No retractions.. Breath sounds WNL, No rubs, rales, rhonchi, or wheeze.. Cardiovascular Heart rhythm and rate regular, no murmur or gallop.. Pedal Pulses WNL. No clubbing, cyanosis or edema. Chest Breasts symmetical and no nipple discharge.. Breast tissue WNL, no masses, lumps, or tenderness.. Lymphatic No adneopathy. No adenopathy. No adenopathy. Musculoskeletal Adexa without tenderness or enlargement.. Digits and nails w/o  clubbing, cyanosis, infection, petechiae, ischemia, or inflammatory conditions.. Integumentary (Hair, Skin) No suspicious lesions. No crepitus or fluctuance. No peri-wound warmth or erythema. No masses.Marland Kitchen Psychiatric Judgement and insight Intact.. No evidence of depression, anxiety, or agitation.. Notes the left lower extremity is looking very clean and healthy. The right lower extremity has slough and is necrotic and extremely tender and this is going to be scheduled for surgery this  Thursday. Electronic Signature(s) Signed: 09/02/2015 3:12:15 PM By: [name], [name] Entered By: [name] on 09/02/2015 15:12:14 Montanye, Jenny DMarland Kitchen ([id number]) -------------------------------------------------------------------------------- Physician Orders Details Patient Name: COLLIER, MONICA D. Date of Service: 09/02/2015 2:45 PM Medical Record Patient Account Number: [id number] Number: [name], RN, [name], Treating RN: 11/17/1945 (69 y.o. [name] Date of Birth/Sex: Female) Other Clinician: Primary Care Physician: [facility], [name] Treating [name] Referring Physician: Glendon Axe Physician/Extender: [place] in Treatment: 30 Verbal / Phone Orders: Yes Clinician: [name], RN, [name], Rita Read Back and Verified: Yes Diagnosis Coding Wound Cleansing Wound #2 Left,Distal Lower Leg o Clean wound with Normal Saline. Wound #5 Right,Posterior Lower Leg o Clean wound with Normal Saline. Anesthetic Wound #2 Left,Distal Lower Leg o Topical Lidocaine 4% cream applied to wound bed prior to debridement Wound #5 Right,Posterior Lower Leg o Topical Lidocaine 4% cream applied to wound bed prior to debridement Primary Wound Dressing Wound #2 Left,Distal Lower Leg o Other: - siltec sorbact Apply sorbact hydrogel and foam Wound #5 Right,Posterior Lower Leg o Santyl Ointment Secondary Dressing Wound #5 Right,Posterior Lower Leg o Boardered Foam Dressing Dressing Change Frequency Wound #2 Left,Distal Lower Leg o Change dressing every other day. - daily if necessary due to drainage Wound #5 Right,Posterior Lower Leg o Change dressing every other day. - daily if necessary due to drainage Follow-up Appointments SATONYA, LUX. ([id number]) Wound #2 Left,Distal Lower Leg o Return Appointment in 1 week. Wound #5 Right,Posterior Lower Leg o Return Appointment in 1 week. Edema Control Wound #2 Left,Distal Lower Leg o Support Garment 20-30 mm/Hg pressure  to: Wound #5 Right,Posterior Lower Leg o Support Garment 20-30 mm/Hg pressure to: Additional Orders / Instructions Wound #2 Left,Distal Lower Leg o Increase protein intake. Wound #5 Right,Posterior Lower Leg o Increase protein intake. Medications-please add to medication list. Wound #2 Left,Distal Lower Leg o P.O. Antibiotics - Complete antibiotics prescribed by Dr. Ola Spurr Wound #5 Right,Posterior Lower Leg o P.O. Antibiotics - Complete antibiotics prescribed by Dr. Ola Spurr Electronic Signature(s) Signed: 09/02/2015 3:06:46 PM By: Regan Lemming [name], RN Signed: 09/02/2015 4:34:10 PM By: [name], [name] Entered By: Regan Lemming on 09/02/2015 15:06:46 Habermann, Shamon DMarland Kitchen ([id number]) -------------------------------------------------------------------------------- Problem List Details Patient Name: URVI, IMES D. Date of Service: 09/02/2015 2:45 PM Medical Record Patient Account Number: [id number] Number: [name], RN, [name], Treating RN: 11-Aug-1946 (69 y.o. 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 Date of Service: 09/02/2015 2:45 PM Medical Record Patient Account Number: [id number] Number: [name], RN, [name], Treating RN: June 02, 1946 (69 y.o. [name] Date of Birth/Sex: Female) Other Clinician: Primary Care Physician: [facility], Delana Meyer Treating [name] Referring Physician: Glendon Axe Physician/Extender: Weeks in Treatment: 30 Subjective Chief Complaint Information obtained from Patient Patient presents to the wound care center for a consult due non healing wound 69 year old patient comes with a history of having a ulcer on the left lower extremity for the past 4 weeks. she says she's had swelling of both lower extremities for about a year after she started having prednisone. 02/07/2015 -- her vascular appointments obtained were in the first and third week of June. she is able to go to [place] and we will try and get her some earlier appointments. Other than that nothing else has changed in her management. History of Present Illness (HPI) 69 year old patient who is known to have diabetes mellitus type 2, chronic renal insufficiency, coronary artery disease, hypertension, hypercholesterolemia, temporal arteritis and inflammatory arthritiss also has a history of having a hysterectomy and some orthopedic related surgeries. The ulcer on the left lower extremity started off as a blister and then. Got progressively worse. She does not have any fever or chills and has not had any recent surgical intervention for this. Her last hemoglobin A1c was 10.1 in September 2015. She has been recently put on doxycycline by her PCP. She is now also allergic to doxycycline and was this was changed over to Keflex. due to her temporal arteritis she  has been on prednisone for about a year and she says ever since that she has had swelling of both lower extremities. She does see a cardiologist and also takes a diuretic. 02/07/2015 her arterial and venous duplex studies to be done have dates been given as the first and third week of June. This is at [facility] We are going to try and get early appointments at [facility]. other than that nothing has changed in her management. 02/14/2015 -- we have been able to get her an appointment in [facility] on May 20 which is much earlier than her previous ones at [place]. She continues with her prednisone and her sugars are in the range of 150-200. 02/21/2015 We were able to get a vascular lab workup for her today and she is going to be there at 2:00 this afternoon. the swelling of her leg has gone down significantly but she still has some tenderness over the wounds. 02/28/2015 - She has had one of two vascular workups done, and this coming Tuesday has another, at [facility]. She continues to be on steroid medications. She has significant sensitivity in her left lower extremity and has pain suggestive of neuropathic pain and I have asked her to address this with her primary care physician. 03/07/2015 -- The patient saw Dr. Lucky Cowboy for a consultation and he has had her arteries are okay but she has 2 incompetent veins on the left lower extremity and he is going to set her up for surgery. Official report is SAKARA, LEHTINEN ([id number]) awaited. Addendum: Official reports are now available and on 03/04/2015. She was seen and lower extremity venous duplex exam was done. There was reflux present within the left greater saphenous vein below the knee and also the left small saphenous  vein. Arterial duplex showed normal triphasic waveforms throughout the left lower extremity without any significant stenosis. Her ABIs were noncompressible bilaterally but a waveforms were normal and a  digital pressures were normal bilaterally consistent with no significant arterial insufficiency. He has recommended endovenous ablation of both the left small saphenous and the left great saphenous vein. This would still be scheduled later. 03/14/2015 -- she has heard back from the vascular office and has surgery scheduled for sometime in July. Her rheumatologist has decreased her prednisone dosage but she still on it. She has also had cataract surgery in her right eye recently this week. 03/20/2015 - No new complaints today. Pain improved. No fever or chills. Tolerating 2 layer compression. 04/14/2015 -- she was doing very well today she went off on vacation and now her edema has increased markedly the ulceration is bigger and her diabetes is not under control. 04/21/2015 -- I spoke to her PCP Dr. Candiss Norse and discussed the management which would include being seen by a general surgeon for debridement and taking multiple punch biopsies which would help in establishing the diagnosis of this is a vasculitis. She is agreeable about this and will set her up for the procedure with Dr. Tamala Julian at [facility]. She was seen by the surgeon Dr. Jamal Collin. 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 She is scheduled to have laser ablation done next week.Pt was sent here for possible biopsy to look for vasculitis. Feel it would be better to wait after laser ablation is completed- the ulcer may heal fully and biopsy may not be necessary 04/29/2015 -- she had the venous ablation done by Dr. Lucky Cowboy last Friday and we do not have any notes yet. She is doing fine otherwise. 05/06/2015 --Review of her recent vascular intervention shows that she was seen by Dr. Lucky Cowboy on 04/29/2015. The follow-up duplex which was done showed that both the great saphenous vein and the small saphenous vein remained patent with  reflux consistent with an unsuccessful ablation. He has rescheduled her for another the endovenous ablation to be done in about 4 weeks time. 05/13/2015 -- he was seen by her surgeon Dr. Jamal Collin who asked her to continue with conservative therapy and he would speak to Dr. Lucky Cowboy about her management. Dr. Lucky Cowboy is going to schedule her surgery in the middle of August for a repeat endovenous ablation. Her pus culture from last week has grown : [place] her noted her sensitivity report but due to her multiple allergies I had tried clindamycin and she developed a rash with this too. She has been prescribed and anti-buttocks in the ER and is has it at home and she will let is know what she is going to be taking. 05/20/2015 -- she has developed a small spot on her right lower extremity but besides that it is not a full fledged ulceration. She did not get to see Dr. Lucky Cowboy last week and hopefully she will see him in the near future. 05/27/2015 -she is still awaiting her appointment with Dr.Dew and her vascular procedure is not scheduled until August 19. She will be seeing her PCP tomorrow and I have asked her to convey our discussion so that she is aware that debridement has not been done yet. ALIECE, HONOLD ([id number]) 06/03/2015 -- was seen by her rheumatologist Dr. Dorthula Matas, who has been treating  her for temporal arteritis and in his note has mentioned the possibility of vasculitis or pyoderma gangrenosum. He is lowering her prednisone to 12-1/2 mg for 1 month and then 10 mg per the next month. I will again make an attempt to speak to her PCP Dr. Candiss Norse and her surgeon Dr. 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 06/17/2015 -- Dr.Dew did her vascular procedure last week and a follow-up venous  ultrasound shows good resolution of the veins as per the patient's history. He is to see her back in 2 weeks. 07/07/2015. -- the patient has had a heavy growth of Proteus mirabilis and Enterococcus faecalis. These are sensitive to several drugs but the problem is she has allergies to all of these and hence I would like her to see Dr. Ola Spurr for this. She is also due to see Dr. Lucky Cowboy tomorrow and I will discussed the management with him including debridement under anesthesia and possible biopsies. 07/14/2015 -- she has an appointment to see Dr. Ola Spurr tomorrow and did see Dr. Bunnie Domino PA who will discuss my request with him. 07/21/2015 -- saw Dr. Ola Spurr was able to do a test on her and has put her on amoxicillin. She has been tolerating that and has had no problems with allergies to this. 07/28/2015 -- Last Friday I spoke to Dr. Leotis Pain regarding her care and he said that her right leg did not need any surgery and on the left leg was doing pretty good. We did agree that if she undergoes any procedure in the future he would do a couple of punch biopsies of the wound. 08/18/2015 -- her right leg is very tender and there is significant amount of slough. The left leg is looking much better 09/02/2015 -- she is going to have a debridement and punch biopsies of her right lower extremity by Dr. Leotis Pain this coming Thursday. Also seen Dr. Ola Spurr who has continued her on ciprofloxacin. Objective Constitutional Pulse regular. Respirations normal and unlabored. Afebrile. Vitals Time Taken: 2:47 PM, Height: 65 in, Weight: 248 lbs, BMI: 41.3, Temperature: 98.3 F, Pulse: 70 bpm, Respiratory Rate: 17 breaths/min, Blood Pressure: 157/70 mmHg. Eyes Nonicteric. Reactive to light. Ears, Nose, Mouth, and Throat Lips, teeth, and gums WNL.Marland Kitchen Moist mucosa without lesions . Neck supple and nontender. No palpable supraclavicular or cervical adenopathy. Normal sized without goiter. SHINEKA, AUBLE  ([id number]) Respiratory WNL. No retractions.. Breath sounds WNL, No rubs, rales, rhonchi, or wheeze.. Cardiovascular Heart rhythm and rate regular, no murmur or gallop.. Pedal Pulses WNL. No clubbing, cyanosis or edema. Chest Breasts symmetical and no nipple discharge.. Breast tissue WNL, no masses, lumps, or tenderness.. Lymphatic No adneopathy. No adenopathy. No adenopathy. Musculoskeletal Adexa without tenderness or enlargement.. Digits and nails w/o clubbing, cyanosis, infection, petechiae, ischemia, or inflammatory conditions.Marland Kitchen Psychiatric Judgement and insight Intact.. No evidence of depression, anxiety, or agitation.. General Notes: the left lower extremity is looking very clean and healthy. The right lower extremity has slough and is necrotic and extremely tender and this is going to be scheduled for surgery this Thursday. Integumentary (Hair, Skin) No suspicious lesions. No crepitus or fluctuance. No peri-wound warmth or erythema. No masses.. Wound #2 status is Open. Original cause of wound was Blister. The wound is located on the Left,Distal Lower Leg. The wound measures 5cm length x 5.5cm width x 0.2cm depth; 21.598cm^2 area and 4.32cm^3 volume. The wound is  limited to skin breakdown. There is no tunneling or undermining noted. There is a large amount of serous drainage noted. The wound margin is distinct with the outline attached to the wound base. There is large (67-100%) red, pink granulation within the wound bed. There is a small (1-33%) amount of necrotic tissue within the wound bed including Adherent Slough. The periwound skin appearance exhibited: Scarring, Moist, Hemosiderin Staining. The periwound skin appearance did not exhibit: Callus, Crepitus, Excoriation, Fluctuance, Friable, Induration, Localized Edema, Rash, Dry/Scaly, Maceration, Atrophie Blanche, Cyanosis, Ecchymosis, Mottled, Pallor, Rubor, Erythema. Periwound temperature was noted as No Abnormality. The  periwound has tenderness on palpation. Wound #5 status is Open. Original cause of wound was Gradually Appeared. The wound is located on the Right,Posterior Lower Leg. The wound measures 5cm length x 5.5cm width x 0.1cm depth; 21.598cm^2 area and 2.16cm^3 volume. The wound is limited to skin breakdown. There is no tunneling or undermining noted. There is a small amount of serosanguineous drainage noted. The wound margin is indistinct and nonvisible. There is no granulation within the wound bed. There is a large (67-100%) amount of necrotic tissue within the wound bed including Adherent Slough. The periwound skin appearance exhibited: Localized Edema, Moist. The periwound skin appearance did not exhibit: Callus, Crepitus, Excoriation, Fluctuance, Friable, Induration, Rash, Scarring, Dry/Scaly, Maceration, Atrophie Blanche, Cyanosis, Ecchymosis, Hemosiderin Staining, Mottled, Pallor, Rubor, Erythema. Periwound temperature was noted as No Abnormality. The periwound has tenderness on palpation. 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 Wound #5 Right,Posterior Lower Leg: Clean wound with Normal Saline. Anesthetic: Wound #2 Left,Distal Lower Leg: Topical Lidocaine 4% cream applied to wound bed prior to debridement Wound #5 Right,Posterior Lower Leg: Topical Lidocaine 4% cream applied to wound bed prior to debridement Primary Wound  Dressing: Wound #2 Left,Distal Lower Leg: Other: - siltec sorbact Apply sorbact hydrogel and foam Wound #5 Right,Posterior Lower Leg: Santyl Ointment Secondary Dressing: Wound #5 Right,Posterior Lower Leg: Boardered Foam Dressing Dressing Change Frequency: Wound #2 Left,Distal Lower Leg: Change dressing every other day. - daily if necessary due to drainage Wound #5 Right,Posterior Lower Leg: Change dressing every other day. - daily if necessary due to drainage Follow-up Appointments: Wound #2 Left,Distal Lower Leg: Return Appointment in 1 week. Wound #5 Right,Posterior Lower Leg: Return Appointment in 1 week. ILA, LANDOWSKI D. ([id number]) Edema Control: Wound #2 Left,Distal Lower Leg: Support Garment 20-30 mm/Hg pressure to: Wound #5 Right,Posterior Lower Leg: Support Garment 20-30 mm/Hg pressure to: Additional Orders / Instructions: Wound #2 Left,Distal Lower Leg: Increase protein intake. Wound #5 Right,Posterior Lower Leg: Increase protein intake. Medications-please add to medication list.: Wound #2 Left,Distal Lower Leg: P.O. Antibiotics - Complete antibiotics prescribed by Dr. Ola Spurr Wound #5 Right,Posterior Lower Leg: P.O. Antibiotics - Complete antibiotics prescribed by Dr. Ola Spurr The patient's left lower extremity wound is looking much cleaner and we will continue with Siltec Sorbact back dressings to be changed twice a week if needed. The right lower extremity wound is extremely tender and she is scheduled  for surgical debridement under anesthesia in the OR this coming Thursday. She would benefit from this in addition to multiple biopsies from the wound edges to confirm the etiology. Pyoderma gangrenosum is possibly a diagnosis by exclusion. Till then we will continue with Santyl on a daily basis. Electronic Signature(s) Signed: 09/02/2015 3:13:10 PM By: [name], [name] Entered By: [name] on 09/02/2015 15:13:10 Nghiem, Phelicia D.  ([id number]) -------------------------------------------------------------------------------- SuperBill Details Patient Name: LAVELLE, BERLAND D. Date of Service: 09/02/2015 Medical Record Patient Account Number: [id number] Number: [name], RN, [name], Treating RN: 05/10/1946 (69 y.o. 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

## 2015-09-03 NOTE — Patient Instructions (Signed)
  Your procedure is scheduled on: Thursday 09/04/2015 Report to Day Surgery. 2ND FLOOR MEDICAL MALL ENTRANCE To find out your arrival time please call [phone] between 1PM - 3PM on TODAY.  Remember: Instructions that are not followed completely may result in serious medical risk, up to and including death, or upon the discretion of your surgeon and anesthesiologist your surgery may need to be rescheduled.    __X__ 1. Do not eat food or drink liquids after midnight. No gum chewing or hard candies.     __X__ 2. No Alcohol for 24 hours before or after surgery.   ____ 3. Bring all medications with you on the day of surgery if instructed.    __X__ 4. Notify your doctor if there is any change in your medical condition     (cold, fever, infections).     Do not wear jewelry, make-up, hairpins, clips or nail polish.  Do not wear lotions, powders, or perfumes.  Do not shave 48 hours prior to surgery. Men may shave face and neck.  Do not bring valuables to the hospital.    [facility] is not responsible for any belongings or valuables.               Contacts, dentures or bridgework may not be worn into surgery.  Leave your suitcase in the car. After surgery it may be brought to your room.  For patients admitted to the hospital, discharge time is determined by your                treatment team.   Patients discharged the day of surgery will not be allowed to drive home.   Please read over the following fact sheets that you were given:   MRSA Information and Surgical Site Infection Prevention   __X__ Take these medicines the morning of surgery with A SIP OF WATER:    1. AMLODIPINE                          (TAKE YOUR BLOOD PRESSURE MEDICINES EXCEPT FOR FLUID PILLS, TAKE YOUR ACID REFLUX PILLS, NO DIABETES MEDICINE THE   2. OMEPRAZOLE                          THE MORNING OF SURGERY AND ONLY HALF OF YOUR BEDTIME INSULIN TONIGHT)  3. MAGNESIUM  4. PREDNISONE  5.  6.  ____ Fleet Enema (as  directed)   __X__ Use CHG Soap as directed  ____ Use inhalers on the day of surgery  ____ Stop metformin 2 days prior to surgery    __X__ Take 1/2 of usual insulin dose the night before surgery and none on the morning of surgery. TONIGHT TAKE 16 UNITS  ____ Stop Coumadin/Plavix/aspirin on   ____ Stop Anti-inflammatories on    ____ Stop supplements until after surgery.    ____ Bring C-Pap to the hospital.

## 2015-09-04 ENCOUNTER — Encounter
Admission: RE | Disposition: A | Discharge status: Home / Self Care | Payer: Self-pay | Source: Ambulatory Visit | Attending: Vascular Surgery

## 2015-09-04 ENCOUNTER — Ambulatory Visit: Payer: PPO | Admitting: Anesthesiology

## 2015-09-04 ENCOUNTER — Encounter: Payer: Self-pay | Admitting: *Deleted

## 2015-09-04 ENCOUNTER — Ambulatory Visit
Admission: RE | Admit: 2015-09-04 | Discharge: 2015-09-04 | Disposition: A | Discharge status: Home / Self Care | Payer: PPO | Source: Ambulatory Visit | Attending: Vascular Surgery | Admitting: Vascular Surgery

## 2015-09-04 DIAGNOSIS — Z881 Allergy status to other antibiotic agents status: Secondary | ICD-10-CM | POA: Insufficient documentation

## 2015-09-04 DIAGNOSIS — Z882 Allergy status to sulfonamides status: Secondary | ICD-10-CM | POA: Insufficient documentation

## 2015-09-04 DIAGNOSIS — Z90710 Acquired absence of both cervix and uterus: Secondary | ICD-10-CM | POA: Insufficient documentation

## 2015-09-04 DIAGNOSIS — B964 Proteus (mirabilis) (morganii) as the cause of diseases classified elsewhere: Secondary | ICD-10-CM | POA: Insufficient documentation

## 2015-09-04 DIAGNOSIS — B965 Pseudomonas (aeruginosa) (mallei) (pseudomallei) as the cause of diseases classified elsewhere: Secondary | ICD-10-CM | POA: Insufficient documentation

## 2015-09-04 DIAGNOSIS — Z880 Allergy status to penicillin: Secondary | ICD-10-CM | POA: Insufficient documentation

## 2015-09-04 DIAGNOSIS — I8391 Asymptomatic varicose veins of right lower extremity: Secondary | ICD-10-CM | POA: Insufficient documentation

## 2015-09-04 DIAGNOSIS — L97213 Non-pressure chronic ulcer of right calf with necrosis of muscle: Secondary | ICD-10-CM | POA: Diagnosis not present

## 2015-09-04 DIAGNOSIS — E119 Type 2 diabetes mellitus without complications: Secondary | ICD-10-CM | POA: Insufficient documentation

## 2015-09-04 DIAGNOSIS — I10 Essential (primary) hypertension: Secondary | ICD-10-CM | POA: Insufficient documentation

## 2015-09-04 DIAGNOSIS — Z833 Family history of diabetes mellitus: Secondary | ICD-10-CM | POA: Insufficient documentation

## 2015-09-04 DIAGNOSIS — N289 Disorder of kidney and ureter, unspecified: Secondary | ICD-10-CM | POA: Insufficient documentation

## 2015-09-04 DIAGNOSIS — Z794 Long term (current) use of insulin: Secondary | ICD-10-CM | POA: Insufficient documentation

## 2015-09-04 DIAGNOSIS — Z8041 Family history of malignant neoplasm of ovary: Secondary | ICD-10-CM | POA: Insufficient documentation

## 2015-09-04 DIAGNOSIS — L0889 Other specified local infections of the skin and subcutaneous tissue: Secondary | ICD-10-CM | POA: Insufficient documentation

## 2015-09-04 DIAGNOSIS — Z7982 Long term (current) use of aspirin: Secondary | ICD-10-CM | POA: Insufficient documentation

## 2015-09-04 DIAGNOSIS — Z1611 Resistance to penicillins: Secondary | ICD-10-CM | POA: Insufficient documentation

## 2015-09-04 DIAGNOSIS — Z79899 Other long term (current) drug therapy: Secondary | ICD-10-CM | POA: Insufficient documentation

## 2015-09-04 DIAGNOSIS — Z7984 Long term (current) use of oral hypoglycemic drugs: Secondary | ICD-10-CM | POA: Insufficient documentation

## 2015-09-04 DIAGNOSIS — Z888 Allergy status to other drugs, medicaments and biological substances status: Secondary | ICD-10-CM | POA: Insufficient documentation

## 2015-09-04 DIAGNOSIS — E7800 Pure hypercholesterolemia, unspecified: Secondary | ICD-10-CM | POA: Insufficient documentation

## 2015-09-04 DIAGNOSIS — B961 Klebsiella pneumoniae [K. pneumoniae] as the cause of diseases classified elsewhere: Secondary | ICD-10-CM | POA: Insufficient documentation

## 2015-09-04 DIAGNOSIS — Z8249 Family history of ischemic heart disease and other diseases of the circulatory system: Secondary | ICD-10-CM | POA: Insufficient documentation

## 2015-09-04 DIAGNOSIS — B9620 Unspecified Escherichia coli [E. coli] as the cause of diseases classified elsewhere: Secondary | ICD-10-CM | POA: Insufficient documentation

## 2015-09-04 HISTORY — PX: I & D EXTREMITY: SHX5045

## 2015-09-04 LAB — GLUCOSE, CAPILLARY
GLUCOSE-CAPILLARY: 269 mg/dL — AB (ref 65–99)
Glucose-Capillary: 278 mg/dL — ABNORMAL HIGH (ref 65–99)
Glucose-Capillary: 246 mg/dL — ABNORMAL HIGH (ref 65–99)

## 2015-09-04 SURGERY — IRRIGATION AND DEBRIDEMENT EXTREMITY
Anesthesia: General | Laterality: Right | Wound class: Dirty or Infected

## 2015-09-04 MED ORDER — PROPOFOL 10 MG/ML IV BOLUS
INTRAVENOUS | Status: DC | PRN
  Administered 2015-09-04: 150 mg via INTRAVENOUS
  Administered 2015-09-04: 50 mg via INTRAVENOUS

## 2015-09-04 MED ORDER — INSULIN ASPART 100 UNIT/ML ~~LOC~~ SOLN
SUBCUTANEOUS | Status: AC
  Filled 2015-09-04: qty 6

## 2015-09-04 MED ORDER — INSULIN ASPART 100 UNIT/ML ~~LOC~~ SOLN
6.0000 [IU] | Freq: Once | SUBCUTANEOUS | Status: AC
  Administered 2015-09-04: 6 [IU] via SUBCUTANEOUS

## 2015-09-04 MED ORDER — ONDANSETRON HCL 4 MG/2ML IJ SOLN: 4.0000 mg | Freq: Once | INTRAMUSCULAR | Status: DC | PRN

## 2015-09-04 MED ORDER — FAMOTIDINE 20 MG PO TABS
20.0000 mg | ORAL_TABLET | Freq: Once | ORAL | Status: AC
  Administered 2015-09-04: 20 mg via ORAL

## 2015-09-04 MED ORDER — HYDROMORPHONE HCL 1 MG/ML IJ SOLN
0.2500 mg | INTRAMUSCULAR | Status: DC | PRN
  Administered 2015-09-04 (×2): 0.25 mg via INTRAVENOUS

## 2015-09-04 MED ORDER — LABETALOL HCL 5 MG/ML IV SOLN
5.0000 mg | INTRAVENOUS | Status: AC
  Administered 2015-09-04: 5 mg via INTRAVENOUS

## 2015-09-04 MED ORDER — HYDROCODONE-ACETAMINOPHEN 5-325 MG PO TABS
1.0000 | ORAL_TABLET | Freq: Four times a day (QID) | ORAL | Status: DC | PRN
Start: 2015-09-04 — End: 2016-02-17

## 2015-09-04 MED ORDER — BUPIVACAINE HCL (PF) 0.5 % IJ SOLN
INTRAMUSCULAR | Status: AC
  Filled 2015-09-04: qty 30

## 2015-09-04 MED ORDER — INSULIN ASPART 100 UNIT/ML ~~LOC~~ SOLN
SUBCUTANEOUS | Status: AC
  Administered 2015-09-04: 8 [IU] via SUBCUTANEOUS
  Filled 2015-09-04: qty 8

## 2015-09-04 MED ORDER — LABETALOL HCL 5 MG/ML IV SOLN
INTRAVENOUS | Status: AC
  Administered 2015-09-04: 5 mg via INTRAVENOUS
  Filled 2015-09-04: qty 4

## 2015-09-04 MED ORDER — VANCOMYCIN HCL IN DEXTROSE 1-5 GM/200ML-% IV SOLN
INTRAVENOUS | Status: AC
  Administered 2015-09-04: 1000 mg via INTRAVENOUS
  Filled 2015-09-04: qty 200

## 2015-09-04 MED ORDER — VANCOMYCIN HCL IN DEXTROSE 1-5 GM/200ML-% IV SOLN
1000.0000 mg | INTRAVENOUS | Status: AC
  Administered 2015-09-04: 1000 mg via INTRAVENOUS

## 2015-09-04 MED ORDER — FAMOTIDINE 20 MG PO TABS
ORAL_TABLET | ORAL | Status: AC
  Administered 2015-09-04: 20 mg via ORAL
  Filled 2015-09-04: qty 1

## 2015-09-04 MED ORDER — MIDAZOLAM HCL 2 MG/2ML IJ SOLN
INTRAMUSCULAR | Status: DC | PRN
  Administered 2015-09-04: 1 mg via INTRAVENOUS

## 2015-09-04 MED ORDER — SODIUM CHLORIDE 0.9 % IV SOLN
INTRAVENOUS | Status: DC
  Administered 2015-09-04: 15:00:00 via INTRAVENOUS

## 2015-09-04 MED ORDER — LIDOCAINE HCL (PF) 1 % IJ SOLN
INTRAMUSCULAR | Status: AC
  Filled 2015-09-04: qty 30

## 2015-09-04 MED ORDER — FENTANYL CITRATE (PF) 100 MCG/2ML IJ SOLN
INTRAMUSCULAR | Status: AC
  Administered 2015-09-04: 25 ug via INTRAVENOUS
  Filled 2015-09-04: qty 2

## 2015-09-04 MED ORDER — FENTANYL CITRATE (PF) 100 MCG/2ML IJ SOLN
INTRAMUSCULAR | Status: DC | PRN
  Administered 2015-09-04: 25 ug via INTRAVENOUS
  Administered 2015-09-04: 50 ug via INTRAVENOUS
  Administered 2015-09-04: 25 ug via INTRAVENOUS

## 2015-09-04 MED ORDER — FENTANYL CITRATE (PF) 100 MCG/2ML IJ SOLN
25.0000 ug | INTRAMUSCULAR | Status: DC | PRN
  Administered 2015-09-04 (×4): 25 ug via INTRAVENOUS

## 2015-09-04 MED ORDER — HYDROMORPHONE HCL 1 MG/ML IJ SOLN
INTRAMUSCULAR | Status: AC
  Administered 2015-09-04: 0.25 mg via INTRAVENOUS
  Filled 2015-09-04: qty 1

## 2015-09-04 MED ORDER — ONDANSETRON HCL 4 MG/2ML IJ SOLN
INTRAMUSCULAR | Status: DC | PRN
  Administered 2015-09-04: 4 mg via INTRAVENOUS

## 2015-09-04 MED ORDER — INSULIN ASPART 100 UNIT/ML ~~LOC~~ SOLN
8.0000 [IU] | Freq: Once | SUBCUTANEOUS | Status: AC
  Administered 2015-09-04: 8 [IU] via SUBCUTANEOUS

## 2015-09-04 MED ORDER — DEXAMETHASONE SODIUM PHOSPHATE 10 MG/ML IJ SOLN
INTRAMUSCULAR | Status: DC | PRN
  Administered 2015-09-04: 5 mg via INTRAVENOUS

## 2015-09-04 MED ORDER — LIDOCAINE HCL (CARDIAC) 20 MG/ML IV SOLN
INTRAVENOUS | Status: DC | PRN
  Administered 2015-09-04: 60 mg via INTRAVENOUS

## 2015-09-04 SURGICAL SUPPLY — 31 items
BRUSH SCRUB 4% CHG (MISCELLANEOUS) ×3 IMPLANT
CANISTER SUCT 1200ML W/VALVE (MISCELLANEOUS) ×3 IMPLANT
CHLORAPREP W/TINT 26ML (MISCELLANEOUS) ×3 IMPLANT
DRAPE INCISE IOBAN 66X45 STRL (DRAPES) IMPLANT
DRSG VAC ATS MED SENSATRAC (GAUZE/BANDAGES/DRESSINGS) IMPLANT
ELECT CAUTERY BLADE 6.4 (BLADE) ×3 IMPLANT
GLOVE BIO SURGEON STRL SZ7 (GLOVE) ×3 IMPLANT
GOWN STRL REUS W/ TWL LRG LVL3 (GOWN DISPOSABLE) ×1 IMPLANT
GOWN STRL REUS W/ TWL XL LVL3 (GOWN DISPOSABLE) ×1 IMPLANT
GOWN STRL REUS W/TWL LRG LVL3 (GOWN DISPOSABLE) ×2
GOWN STRL REUS W/TWL XL LVL3 (GOWN DISPOSABLE) ×2
HANDPIECE SUCTION TUBG SURGILV (MISCELLANEOUS) ×3 IMPLANT
IV NS 1000ML (IV SOLUTION) ×2
IV NS 1000ML BAXH (IV SOLUTION) ×1 IMPLANT
KIT RM TURNOVER STRD PROC AR (KITS) ×3 IMPLANT
LABEL OR SOLS (LABEL) ×3 IMPLANT
NS IRRIG 500ML POUR BTL (IV SOLUTION) ×3 IMPLANT
PACK EXTREMITY ARMC (MISCELLANEOUS) ×3 IMPLANT
PAD GROUND ADULT SPLIT (MISCELLANEOUS) ×3 IMPLANT
PAD PREP 24X41 OB/GYN DISP (PERSONAL CARE ITEMS) ×3 IMPLANT
SOL PREP PVP 2OZ (MISCELLANEOUS) ×3
SOLUTION PREP PVP 2OZ (MISCELLANEOUS) ×1 IMPLANT
SPONGE LAP 18X18 5 PK (GAUZE/BANDAGES/DRESSINGS) ×3 IMPLANT
SUT ETHILON 4-0 (SUTURE) ×2
SUT ETHILON 4-0 FS2 18XMFL BLK (SUTURE) ×1
SUT VIC AB 3-0 SH 27 (SUTURE) ×4
SUT VIC AB 3-0 SH 27X BRD (SUTURE) ×2 IMPLANT
SUTURE ETHLN 4-0 FS2 18XMF BLK (SUTURE) ×1 IMPLANT
SWAB CULTURE AMIES ANAERIB BLU (MISCELLANEOUS) ×3 IMPLANT
SYR BULB EAR ULCER 3OZ GRN STR (SYRINGE) ×3 IMPLANT
WND VAC CANISTER 500ML (MISCELLANEOUS) IMPLANT

## 2015-09-04 NOTE — Transfer of Care (Signed)
Immediate Anesthesia Transfer of Care Note  Patient: [name]  Procedure(s) Performed: Procedure(s): IRRIGATION AND DEBRIDEMENT EXTREMITY/ AND BIOPSY (Right)  Patient Location: PACU  Anesthesia Type:General  Level of Consciousness: awake and patient cooperative  Airway & Oxygen Therapy: Patient Spontanous Breathing and Patient connected to face mask oxygen  Post-op Assessment: Report given to RN and Post -op Vital signs reviewed and stable  Post vital signs: Reviewed and stable  Last Vitals:  Filed Vitals:   09/04/15 1615  BP: 185/99  Pulse: 88  Temp: 36.2 C  Resp: 19    Complications: No apparent anesthesia complications

## 2015-09-04 NOTE — H&P (Signed)
[facility] History & Physical Update  The patient was interviewed and re-examined.  The patient's previous History and Physical has been reviewed and is unchanged.  There is no change in the plan of care. We plan to proceed with the scheduled procedure.  DEW,JASON, MD  09/04/2015, 3:02 PM

## 2015-09-04 NOTE — Anesthesia Procedure Notes (Signed)
Procedure Name: LMA Insertion Date/Time: 09/04/2015 3:30 PM Performed by: Dionne Bucy Pre-anesthesia Checklist: Patient identified, Patient being monitored, Timeout performed, Emergency Drugs available and Suction available Patient Re-evaluated:Patient Re-evaluated prior to inductionOxygen Delivery Method: Circle system utilized Preoxygenation: Pre-oxygenation with 100% oxygen Intubation Type: IV induction Ventilation: Mask ventilation without difficulty LMA: LMA inserted LMA Size: 4.0 Tube type: Oral Number of attempts: 1 Placement Confirmation: positive ETCO2 and breath sounds checked- equal and bilateral Tube secured with: Tape Dental Injury: Teeth and Oropharynx as per pre-operative assessment

## 2015-09-04 NOTE — Op Note (Signed)
    OPERATIVE NOTE   PROCEDURE: 1. Irrigation and excisional debridement of skin, soft tissue, and muscle to about 40 cm2 to the right posterior calf with biopsy  PRE-OPERATIVE DIAGNOSIS: Nonviable tissue and infection of right calf wound  POST-OPERATIVE DIAGNOSIS: Same as above  SURGEON: Leotis Pain, MD  ASSISTANT(S): none  ANESTHESIA: general  ESTIMATED BLOOD LOSS: 25 cc  FINDING(S): None  SPECIMEN(S):  Biopsy of wound edge as well as a culture was sent  INDICATIONS:   [name] is a 69 y.o. female who presents with a non-healing ulcer of the right posterior calf.  This was painful and had necrotic eschar with possible infection.  The cause was not entirely clear.  It needed debridement and the [place] also requested a biopsy to try to determine the cause.  Risks and benefits were discussed and informed consent was obtained.  DESCRIPTION: After obtaining full informed written consent, the patient was brought back to the operating room and placed supine upon the operating table.  The patient received IV antibiotics prior to induction.  After obtaining adequate anesthesia, the patient was prepped and draped in the standard fashion for.  The wound was then opened and excisional debridement was performed to the skin, soft tissue, and muscle to remove all clearly non-viable tissue.  The tissue was taken back to bleeding tissue that appeared viable.  The excisional debridement was performed with scalpel, Metzenbaum, and Mayo scissors and encompassed an area of approximately 40 cm2. An area of bleeding was seen in debridement of the muscle, and this was controlled with 3-0 Vicryl suture.  After all clearly non-viable tissue was removed, a wet to dry dressing was placed on the wound. The patient was then awakened from anesthesia and taken to the recovery room in stable condition having tolerated the procedure well.  COMPLICATIONS: none  CONDITION:  stable  [name]  09/04/2015, 4:01 PM

## 2015-09-04 NOTE — Progress Notes (Signed)
Blood pressure 193/85 labetalol 5mg  given

## 2015-09-04 NOTE — Progress Notes (Signed)
Called dr Marcello Moores for blood sugar of 269  Gave novolog 8 units subq   Blood pressure 216/96  No new orders other then pain medication

## 2015-09-04 NOTE — Discharge Instructions (Signed)

## 2015-09-04 NOTE — Anesthesia Postprocedure Evaluation (Signed)
  Anesthesia Post-op Note  Patient: [name]  Procedure(s) Performed: Procedure(s): IRRIGATION AND DEBRIDEMENT EXTREMITY/ AND BIOPSY (Right)  Anesthesia type:General  Patient location: PACU  Post pain: Pain level controlled  Post assessment: Post-op Vital signs reviewed, Patient's Cardiovascular Status Stable, Respiratory Function Stable, Patent Airway and No signs of Nausea or vomiting  Post vital signs: Reviewed and stable  Last Vitals:  Filed Vitals:   09/04/15 1800  BP: 163/64  Pulse: 85  Temp:   Resp: 16    Level of consciousness: awake, alert  and patient cooperative  Complications: No apparent anesthesia complications

## 2015-09-04 NOTE — Anesthesia Preprocedure Evaluation (Addendum)
Anesthesia Evaluation  Patient identified by MRN, date of birth, ID band Patient awake    Reviewed: Allergy & Precautions, NPO status , Patient's Chart, lab work & pertinent test results  Airway Mallampati: III  TM Distance: <3 FB     Dental  (+) Upper Dentures, Lower Dentures   Pulmonary former smoker,           Cardiovascular hypertension, Pt. on medications + CAD and + Peripheral Vascular Disease       Neuro/Psych negative neurological ROS  negative psych ROS   GI/Hepatic GERD  Medicated and Controlled,  Endo/Other  diabetes, Well Controlled, Type 2, Oral Hypoglycemic AgentsHypothyroidism   Renal/GU Renal InsufficiencyRenal disease     Musculoskeletal  (+) Arthritis , Osteoarthritis,    Abdominal   Peds negative pediatric ROS (+)  Hematology negative hematology ROS (+)   Anesthesia Other Findings Ulcer right lower leg  Reproductive/Obstetrics                            Anesthesia Physical Anesthesia Plan  ASA: III  Anesthesia Plan: General   Post-op Pain Management:    Induction: Intravenous  Airway Management Planned: Oral ETT and LMA  Additional Equipment:   Intra-op Plan:   Post-operative Plan: Extubation in OR  Informed Consent:   Dental advisory given  Plan Discussed with: CRNA and Surgeon  Anesthesia Plan Comments:        Anesthesia Quick Evaluation

## 2015-09-05 ENCOUNTER — Encounter: Payer: Self-pay | Admitting: Vascular Surgery

## 2015-09-05 LAB — GLUCOSE, CAPILLARY: GLUCOSE-CAPILLARY: 246 mg/dL — AB (ref 65–99)

## 2015-09-08 LAB — WOUND CULTURE

## 2015-09-08 LAB — ANAEROBIC CULTURE

## 2015-09-09 ENCOUNTER — Encounter: Payer: PPO | Admitting: Surgery

## 2015-09-09 DIAGNOSIS — E11622 Type 2 diabetes mellitus with other skin ulcer: Secondary | ICD-10-CM | POA: Diagnosis not present

## 2015-09-10 LAB — SURGICAL PATHOLOGY

## 2015-09-10 NOTE — Progress Notes (Signed)
[name] ([id number]) Visit Report for 09/09/2015 Arrival Information Details Patient Name: [name], [name] Date of Service: 09/09/2015 1:45 PM Medical Record Number: [id number] Patient Account Number: [id number] Date of Birth/Sex: 09-20-1946 (68 y.o. Female) Treating RN: [name] Primary Care Physician: Glendon Axe Other Clinician: Referring Physician: Glendon Axe Treating Physician/Extender: Frann Rider in Treatment: 52 Visit Information History Since Last Visit Added or deleted any medications: No Patient Arrived: Cane Any new allergies or adverse reactions: No Arrival Time: 14:00 Had a fall or experienced change in No Accompanied By: granddaughter activities of daily living that may affect Transfer Assistance: None risk of falls: Patient Has Alerts: Yes Signs or symptoms of abuse/neglect since last No Patient Alerts: Patient on Blood Thinner visito Hospitalized since last visit: No Has Dressing in Place as Prescribed: Yes Has Compression in Place as Prescribed: Yes Pain Present Now: No Electronic Signature(s) Signed: 09/09/2015 5:36:16 PM By: [name], RN, [name], [name], [name] Entered By: [name], RN, [name], Kim on 09/09/2015 14:01:38 Much, Lysa DMarland Kitchen ([id number]) -------------------------------------------------------------------------------- Clinic Level of Care Assessment Details Patient Name: [name], [name] Date of Service: 09/09/2015 1:45 PM Medical Record Number: [id number] Patient Account Number: [id number] Date of Birth/Sex: 07/07/1946 (68 y.o. Female) Treating RN: [name] Primary Care Physician: Glendon Axe Other Clinician: Referring Physician: Glendon Axe Treating Physician/Extender: Frann Rider in Treatment: 31 Clinic Level of Care Assessment Items TOOL 4 Quantity Score []  - Use when only an EandM is performed on FOLLOW-UP visit 0 ASSESSMENTS - Nursing Assessment / Reassessment []  - Reassessment of Co-morbidities (includes  updates in patient status) 0 []  - Reassessment of Adherence to Treatment Plan 0 ASSESSMENTS - Wound and Skin Assessment / Reassessment []  - Simple Wound Assessment / Reassessment - one wound 0 X - Complex Wound Assessment / Reassessment - multiple wounds 2 5 []  - Dermatologic / Skin Assessment (not related to wound area) 0 ASSESSMENTS - Focused Assessment []  - Circumferential Edema Measurements - multi extremities 0 []  - Nutritional Assessment / Counseling / Intervention 0 []  - Lower Extremity Assessment (monofilament, tuning fork, pulses) 0 []  - Peripheral Arterial Disease Assessment (using hand held doppler) 0 ASSESSMENTS - Ostomy and/or Continence Assessment and Care []  - Incontinence Assessment and Management 0 []  - Ostomy Care Assessment and Management (repouching, etc.) 0 PROCESS - Coordination of Care []  - Simple Patient / Family Education for ongoing care 0 X - Complex (extensive) Patient / Family Education for ongoing care 1 20 X - Staff obtains Programmer, systems, Records, Test Results / Process Orders 1 10 []  - Staff telephones HHA, Nursing Homes / Clarify orders / etc 0 []  - Routine Transfer to another Facility (non-emergent condition) 0 Sachs, Eliot D. ([id number]) []  - Routine Hospital Admission (non-emergent condition) 0 []  - New Admissions / Biomedical engineer / Ordering NPWT, Apligraf, etc. 0 []  - Emergency Hospital Admission (emergent condition) 0 X - Simple Discharge Coordination 1 10 []  - Complex (extensive) Discharge Coordination 0 PROCESS - Special Needs []  - Pediatric / Minor Patient Management 0 []  - Isolation Patient Management 0 []  - Hearing / Language / Visual special needs 0 []  - Assessment of Community assistance (transportation, D/C planning, etc.) 0 []  - Additional assistance / Altered mentation 0 []  - Support Surface(s) Assessment (bed, cushion, seat, etc.) 0 INTERVENTIONS - Wound Cleansing / Measurement []  - Simple Wound Cleansing - one wound 0 X -  Complex Wound Cleansing - multiple wounds 2 5 X - Wound Imaging (photographs - any number of wounds) 1  5 []  - Wound Tracing (instead of photographs) 0 []  - Simple Wound Measurement - one wound 0 X - Complex Wound Measurement - multiple wounds 2 5 INTERVENTIONS - Wound Dressings []  - Small Wound Dressing one or multiple wounds 0 X - Medium Wound Dressing one or multiple wounds 2 15 []  - Large Wound Dressing one or multiple wounds 0 []  - Application of Medications - topical 0 []  - Application of Medications - injection 0 INTERVENTIONS - Miscellaneous []  - External ear exam 0 Gramajo, Anayansi D. ([id number]) []  - Specimen Collection (cultures, biopsies, blood, body fluids, etc.) 0 []  - Specimen(s) / Culture(s) sent or taken to Lab for analysis 0 []  - Patient Transfer (multiple staff / [name]) 0 []  - Simple Staple / Suture removal (25 or less) 0 []  - Complex Staple / Suture removal (26 or more) 0 []  - Hypo / Hyperglycemic Management (close monitor of Blood Glucose) 0 []  - Ankle / Brachial Index (ABI) - do not check if billed separately 0 X - Vital Signs 1 5 Has the patient been seen at the hospital within the last three years: Yes Total Score: 110 Level Of Care: New/Established - Level 3 Electronic Signature(s) Signed: 09/09/2015 5:36:16 PM By: [name], RN, [name], [name], [name] Entered By: [name], RN, [name], Kim on 09/09/2015 14:24:00 Purnell, Othell DMarland Kitchen ([id number]) -------------------------------------------------------------------------------- Encounter Discharge Information Details Patient Name: [name], [name] Date of Service: 09/09/2015 1:45 PM Medical Record Number: [id number] Patient Account Number: [id number] Date of Birth/Sex: 1945-11-28 (68 y.o. Female) Treating RN: [name] Primary Care Physician: Glendon Axe Other Clinician: Referring Physician: Glendon Axe Treating Physician/Extender: Frann Rider in Treatment: 6 Encounter Discharge Information  Items Discharge Pain Level: 0 Discharge Condition: Stable Ambulatory Status: Cane Discharge Destination: Home Transportation: Private Auto Accompanied By: grandaughter Schedule Follow-up Appointment: Yes Medication Reconciliation completed and provided to Yes Patient/Care Emeline Simpson: Provided on Clinical Summary of Care: 09/09/2015 Form Type Recipient Paper Patient LM Electronic Signature(s) Signed: 09/09/2015 2:41:52 PM By: Ruthine Dose Entered By: Ruthine Dose on 09/09/2015 14:41:51 Shepherd, Willamina D. ([id number]) -------------------------------------------------------------------------------- Lower Extremity Assessment Details Patient Name: [name] Date of Service: 09/09/2015 1:45 PM Medical Record Number: [id number] Patient Account Number: [id number] Date of Birth/Sex: [birth year] (68 y.o. Female) Treating RN: [name] Primary Care Physician: Glendon Axe Other Clinician: Referring Physician: Glendon Axe Treating Physician/Extender: Frann Rider in Treatment: 31 Edema Assessment Assessed: [Left: No] [Right: No] E[Left: dema] [Right: :] Calf Left: Right: Point of Measurement: 30 cm From Medial Instep 35.5 cm 38 cm Ankle Left: Right: Point of Measurement: 9 cm From Medial Instep 24 cm 25.5 cm Vascular Assessment Pulses: Posterior Tibial Dorsalis Pedis Palpable: [Left:Yes] [Right:Yes] Extremity colors, hair growth, and conditions: Extremity Color: [Left:Hyperpigmented] [Right:Hyperpigmented] Hair Growth on Extremity: [Left:Yes] [Right:Yes] Temperature of Extremity: [Left:Warm] [Right:Warm] Capillary Refill: [Left:< 3 seconds] [Right:< 3 seconds] Toe Nail Assessment Left: Right: Thick: No No Discolored: No No Deformed: No No Improper Length and Hygiene: No No Electronic Signature(s) Signed: 09/09/2015 5:36:16 PM By: [name], RN, [name], [name], [name] Entered By: [name], RN, [name], Kim on 09/09/2015 14:08:57 Pinera, Kierra D.  ([id number]) -------------------------------------------------------------------------------- Multi Wound Chart Details Patient Name: [name] Date of Service: 09/09/2015 1:45 PM Medical Record Number: [id number] Patient Account Number: [id number] Date of Birth/Sex: 1946/01/13 (68 y.o. Female) Treating RN: [name] Primary Care Physician: Glendon Axe Other Clinician: Referring Physician: Glendon Axe Treating Physician/Extender: Frann Rider in Treatment: 31 Vital Signs Height(in): 65 Pulse(bpm): 80 Weight(lbs):  248 Blood Pressure 158/60 (mmHg): Body Mass Index(BMI): 41 Temperature(F): 97.5 Respiratory Rate 18 (breaths/min): Photos: [2:No Photos] [5:No Photos] [N/A:N/A] Wound Location: [2:Left, Distal Lower Leg] [5:Right, Posterior Lower Leg] [N/A:N/A] Wounding Event: [2:Blister] [5:Gradually Appeared] [N/A:N/A] Primary Etiology: [2:Venous Leg Ulcer] [5:Venous Leg Ulcer] [N/A:N/A] Date Acquired: [2:12/30/2014] [5:04/29/2015] [N/A:N/A] Weeks of Treatment: [2:31] [5:16] [N/A:N/A] Wound Status: [2:Open] [5:Open] [N/A:N/A] Clustered Wound: [2:Yes] [5:No] [N/A:N/A] Measurements L x W x D 4.5x5x0.1 [5:5x7x0.2] [N/A:N/A] (cm) Area (cm) : [2:17.671] [id number] [N/A:N/A] Volume (cm) : [2:1.767] [5:5.498] [N/A:N/A] % Reduction in Area: [2:-2399.40%] [5:-14521.80%] [N/A:N/A] % Reduction in Volume: -2388.70% [5:-28836.80%] [N/A:N/A] Classification: [2:Full Thickness Without Exposed Support Structures] [5:Full Thickness Without Exposed Support Structures] [N/A:N/A] Periwound Skin Texture: No Abnormalities Noted [5:No Abnormalities Noted] [N/A:N/A] Periwound Skin [2:No Abnormalities Noted] [5:No Abnormalities Noted] [N/A:N/A] Moisture: Periwound Skin Color: No Abnormalities Noted [5:No Abnormalities Noted] [N/A:N/A] Tenderness on [2:No] [5:No] [N/A:N/A] Treatment Notes Electronic Signature(s) Signed: 09/09/2015 5:36:16 PM By: [name], RN, [name], [name], [name] Holzschuh,  Fantashia D. ([id number]) Entered By: [name], RN, [name], Kim on 09/09/2015 14:16:50 Delmonaco, Tia Masker ([id number]) -------------------------------------------------------------------------------- Tower Details Patient Name: SALEEMAH, DEVENNEY D. Date of Service: 09/09/2015 1:45 PM Medical Record Number: [id number] Patient Account Number: [id number] Date of Birth/Sex: 1946/01/29 (68 y.o. Female) Treating RN: [name] Primary Care Physician: Glendon Axe Other Clinician: Referring Physician: Glendon Axe Treating Physician/Extender: Frann Rider in Treatment: 61 Active Inactive Orientation to the Wound Care Program Nursing Diagnoses: Knowledge deficit related to the wound healing center program Goals: Patient/caregiver will verbalize understanding of the [place] Program Date Initiated: 01/31/2015 Goal Status: Active Interventions: Provide education on orientation to the wound center Notes: Venous Leg Ulcer Nursing Diagnoses: Potential for venous Insuffiency (use before diagnosis confirmed) Goals: Non-invasive venous studies are completed as ordered Date Initiated: 01/31/2015 Goal Status: Active Patient/caregiver will verbalize understanding of disease process and disease management Date Initiated: 01/31/2015 Goal Status: Active Interventions: Assess peripheral edema status every visit. Notes: Wound/Skin Impairment Nursing Diagnoses: Impaired tissue integrity Knowledge deficit related to smoking impact on wound healing Lippard, Mayla D. ([id number]) Goals: Patient/caregiver will verbalize understanding of skin care regimen Date Initiated: 01/31/2015 Goal Status: Active Ulcer/skin breakdown will heal within 14 weeks Date Initiated: 01/31/2015 Goal Status: Active Interventions: Assess ulceration(s) every visit Notes: Electronic Signature(s) Signed: 09/09/2015 5:36:16 PM By: [name], RN, [name], [name], [name] Entered By: [name], RN, [name], Kim on 09/09/2015  14:16:42 Hedeen, Denijah D. ([id number]) -------------------------------------------------------------------------------- Pain Assessment Details Patient Name: ANNALI, VERGEL D. Date of Service: 09/09/2015 1:45 PM Medical Record Number: [id number] Patient Account Number: [id number] Date of Birth/Sex: 11/03/1945 (68 y.o. Female) Treating RN: [name] Primary Care Physician: Glendon Axe Other Clinician: Referring Physician: Glendon Axe Treating Physician/Extender: Frann Rider in Treatment: 31 Active Problems Location of Pain Severity and Description of Pain Patient Has Paino Yes Site Locations Pain Location: Pain in Ulcers With Dressing Change: Yes Duration of the Pain. Constant / Intermittento Intermittent Character of Pain Describe the Pain: Aching, Burning, Heavy, Shooting Pain Management and Medication Current Pain Management: Medication: Yes Electronic Signature(s) Signed: 09/09/2015 5:36:16 PM By: [name], RN, [name], [name], [name] Entered By: [name], RN, [name], Kim on 09/09/2015 14:02:01 Chrobak, Tia Masker ([id number]) -------------------------------------------------------------------------------- Patient/Caregiver Education Details Patient Name: [name] Date of Service: 09/09/2015 1:45 PM Medical Record Number: [id number] Patient Account Number: [id number] Date of Birth/Gender: Feb 14, 1946 (68 y.o. Female) Treating RN: [name] Primary Care Physician: Glendon Axe Other Clinician: Referring Physician: Glendon Axe Treating Physician/Extender: Frann Rider in  Treatment: 31 Education Assessment Education Provided To: Patient Education Topics Provided Wound/Skin Impairment: Handouts: Caring for Your Ulcer, Other: wound carfe as prescribed Methods: Demonstration, Explain/Verbal Responses: State content correctly Electronic Signature(s) Signed: 09/09/2015 5:36:16 PM By: [name], RN, [name], [name], [name] Entered By: [name], RN, [name], Kim on 09/09/2015  14:26:22 Budnick, Tia Masker ([id number]) -------------------------------------------------------------------------------- Wound Assessment Details Patient Name: GEORDAN, WOOLVERTON D. Date of Service: 09/09/2015 1:45 PM Medical Record Number: [id number] Patient Account Number: [id number] Date of Birth/Sex: [birth year] (68 y.o. Female) Treating RN: [name] Primary Care Physician: Glendon Axe Other Clinician: Referring Physician: Glendon Axe Treating Physician/Extender: Frann Rider in Treatment: 31 Wound Status Wound Number: 2 Primary Etiology: Venous Leg Ulcer Wound Location: Left, Distal Lower Leg Wound Status: Open Wounding Event: Blister Date Acquired: 12/30/2014 Weeks Of Treatment: 31 Clustered Wound: Yes Photos Photo Uploaded By: [name], RN, [name], Kim on 09/09/2015 17:38:57 Wound Measurements Length: (cm) 4.5 Width: (cm) 5 Depth: (cm) 0.1 Area: (cm) 17.671 Volume: (cm) 1.767 % Reduction in Area: -2399.4% % Reduction in Volume: -2388.7% Wound Description Full Thickness Without Exposed Classification: Support Structures Periwound Skin Texture Texture Color No Abnormalities Noted: No No Abnormalities Noted: No Moisture No Abnormalities Noted: No Treatment Notes Wound #2 (Left, Distal Lower Leg) Mccormick, Zhuri D. ([id number]) 1. Cleansed with: Clean wound with Normal Saline 2. Anesthetic Topical Lidocaine 4% cream to wound bed prior to debridement 4. Dressing Applied: Other dressing (specify in notes) Notes siltec and maxisorb; coban to secure Electronic Signature(s) Signed: 09/09/2015 5:36:16 PM By: [name], RN, [name], [name], [name] Entered By: [name], RN, [name], Kim on 09/09/2015 14:11:04 Felten, Sherine D. ([id number]) -------------------------------------------------------------------------------- Wound Assessment Details Patient Name: VANGIE, SPEEGLE D. Date of Service: 09/09/2015 1:45 PM Medical Record Number: [id number] Patient Account Number: [id number] Date of  Birth/Sex: 05-24-1946 (68 y.o. Female) Treating RN: [name] Primary Care Physician: Glendon Axe Other Clinician: Referring Physician: Glendon Axe Treating Physician/Extender: Frann Rider in Treatment: 31 Wound Status Wound Number: 5 Primary Etiology: Venous Leg Ulcer Wound Location: Right, Posterior Lower Leg Wound Status: Open Wounding Event: Gradually Appeared Date Acquired: 04/29/2015 Weeks Of Treatment: 16 Clustered Wound: No Photos Photo Uploaded By: [name], RN, [name], Kim on 09/09/2015 17:38:58 Wound Measurements Length: (cm) 5 Width: (cm) 7 Depth: (cm) 0.2 Area: (cm) 27.489 Volume: (cm) 5.498 % Reduction in Area: -14521.8% % Reduction in Volume: -28836.8% Wound Description Full Thickness Without Exposed Classification: Support Structures Periwound Skin Texture Texture Color No Abnormalities Noted: No No Abnormalities Noted: No Moisture No Abnormalities Noted: No Treatment Notes Wound #5 (Right, Posterior Lower Leg) Uyeno, Dion D. ([id number]) 1. Cleansed with: Clean wound with Normal Saline 2. Anesthetic Topical Lidocaine 4% cream to wound bed prior to debridement 4. Dressing Applied: Other dressing (specify in notes) Notes siltec and maxisorb; coban to secure Electronic Signature(s) Signed: 09/09/2015 5:36:16 PM By: [name], RN, [name], [name], [name] Entered By: [name], RN, [name], Kim on 09/09/2015 14:11:04 Mysliwiec, Myracle DMarland Kitchen ([id number]) -------------------------------------------------------------------------------- Vitals Details Patient Name: [name] Date of Service: 09/09/2015 1:45 PM Medical Record Number: [id number] Patient Account Number: [id number] Date of Birth/Sex: 02-10-1946 (68 y.o. Female) Treating RN: [name] Primary Care Physician: Glendon Axe Other Clinician: Referring Physician: Glendon Axe Treating Physician/Extender: Frann Rider in Treatment: 31 Vital Signs Time Taken: 14:03 Temperature (F):  97.5 Height (in): 65 Pulse (bpm): 80 Weight (lbs): 248 Respiratory Rate (breaths/min): 18 Body Mass Index (BMI): 41.3 Blood Pressure (mmHg): 158/60 Reference Range: 80 - 120 mg / dl Electronic Signature(s) Signed: 09/09/2015  5:36:16 PM By: [name], RN, [name], [name], [name] Entered By: [name], RN, [name], Kim on 09/09/2015 14:04:20

## 2015-09-10 NOTE — Progress Notes (Addendum)
LUVENIA, HEARING ([id number]) Visit Report for 09/09/2015 Chief Complaint Document Details Patient Name: [name], [name] 09/09/2015 1:45 Date of Service: PM Medical Record [id number] Number: Patient Account Number: [id number] 1946/02/24 (69 y.o. Treating RN: [name] Date of Birth/Sex: Female) Other Clinician: Primary Care Physician: [facility], Delana Meyer Treating [name] Referring Physician: Glendon Axe Physician/Extender: Weeks in Treatment: 31 Information Obtained from: Patient Chief Complaint Patient presents to the wound care center for a consult due non healing wound 69 year old patient comes with a history of having a ulcer on the left lower extremity for the past 4 weeks. she says she's had swelling of both lower extremities for about a year after she started having prednisone. 02/07/2015 -- her vascular appointments obtained were in the first and third week of June. she is able to go to [place] and we will try and get her some earlier appointments. Other than that nothing else has changed in her management. Electronic Signature(s) Signed: 09/09/2015 2:56:50 PM By: [name], [name] Entered By: [name] on 09/09/2015 14:56:50 Granillo, KADE DEATRICK ([id number]) -------------------------------------------------------------------------------- HPI Details Patient Name: Kristin, [name] 09/09/2015 1:45 Date of Service: PM Medical Record [id number] Number: Patient Account Number: [id number] Oct 05, 1946 (69 y.o. Treating RN: [name] Date of Birth/Sex: Female) Other Clinician: Primary Care Physician: [facility], Delana Meyer Treating Mahad Newstrom Referring Physician: Glendon Axe Physician/Extender: Weeks in Treatment: 60 History of Present Illness HPI Description: 69 year old patient who is known to have diabetes mellitus type 2, chronic renal insufficiency, coronary artery disease, hypertension, hypercholesterolemia, temporal arteritis and inflammatory arthritiss  also has a history of having a hysterectomy and some orthopedic related surgeries. The ulcer on the left lower extremity started off as a blister and then. Got progressively worse. She does not have any fever or chills and has not had any recent surgical intervention for this. Her last hemoglobin A1c was 10.1 in September 2015. She has been recently put on doxycycline by her PCP. She is now also allergic to doxycycline and was this was changed over to Keflex. 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 She does see a cardiologist and also takes a diuretic. 02/07/2015 her arterial and venous duplex studies to be done have dates been given as the first and third week of June. This is at [facility]. We are going to try and get early appointments at [facility]. other than that nothing has changed in her management. 02/14/2015 -- we have been able to get her an appointment in [facility] on May 20 which is much earlier than her previous ones at [place]. She continues with her prednisone and her sugars are in the range of 150-200. 02/21/2015 We were able to get a vascular lab workup for her today and she is going to be there at 2:00 this afternoon. the swelling of her leg has gone down significantly but she still has some tenderness over the wounds. 02/28/2015 - She has had one of two vascular workups done, and this coming Tuesday has another, at [facility]. She continues to be on steroid medications. She has significant sensitivity in her left lower extremity and has pain suggestive of neuropathic pain and I have asked her to address this with her primary care physician. 03/07/2015 -- The patient saw Dr. Lucky Cowboy for a consultation and he has had her arteries are okay but she has 2 incompetent veins on the  left lower extremity and he is going to set her up for surgery.  Official report is awaited. Addendum: Official reports are now available and on 03/04/2015. She was seen and lower extremity venous duplex exam was done. There was reflux present within the left greater saphenous vein below the knee and also the left small saphenous vein. Arterial duplex showed normal triphasic waveforms throughout the left lower extremity without any significant stenosis. Her ABIs were noncompressible bilaterally but a waveforms were normal and a digital pressures were normal bilaterally consistent with no significant arterial insufficiency. He has recommended endovenous ablation of both the left small saphenous and the left great saphenous vein. This would still be scheduled later. 03/14/2015 -- she has heard back from the vascular office and has surgery scheduled for sometime in July. Kristin, [name] ([id number]) Her rheumatologist has decreased her prednisone dosage but she still on it. She has also had cataract surgery in her right eye recently this week. 03/20/2015 - No new complaints today. Pain improved. No fever or chills. Tolerating 2 layer compression. 04/14/2015 -- she was doing very well today she went off on vacation and now her edema has increased markedly the ulceration is bigger and her diabetes is not under control. 04/21/2015 -- I spoke to her PCP Dr. Candiss Norse and discussed the management which would include being seen by a general surgeon for debridement and taking multiple punch biopsies which would help in establishing the diagnosis of this is a vasculitis. She is agreeable about this and will set her up for the procedure with Dr. Tamala Julian at [facility]. She was seen by the surgeon Dr. Jamal Collin. 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 She is scheduled to have laser ablation done next week.Pt was sent here for possible biopsy to look for  vasculitis. Feel it would be better to wait after laser ablation is completed- the ulcer may heal fully and biopsy may not be necessary 04/29/2015 -- she had the venous ablation done by Dr. Lucky Cowboy last Friday and we do not have any notes yet. She is doing fine otherwise. 05/06/2015 --Review of her recent vascular intervention shows that she was seen by Dr. Lucky Cowboy on 04/29/2015. The follow-up duplex which was done showed that both the great saphenous vein and the small saphenous vein remained patent with reflux consistent with an unsuccessful ablation. He has rescheduled her for another the endovenous ablation to be done in about 4 weekso time. 05/13/2015 -- he was seen by her surgeon Dr. Jamal Collin who asked her to continue with conservative therapy and he would speak to Dr. Lucky Cowboy about her management. Dr. Lucky Cowboy is going to schedule her surgery in the middle of August for a repeat endovenous ablation. Her pus culture from last week has grown : Komatke her noted her sensitivity report but due to her multiple allergies I had tried clindamycin and she developed a rash with this too. She has been prescribed and anti-buttocks in the ER and is has it at home and she will let is know what she is going to be taking. 05/20/2015 -- she has developed a small spot on her right lower extremity but besides that it is not a full fledged ulceration. She did not get to see Dr. Lucky Cowboy last week and hopefully she will see him in the near future. 05/27/2015 -she is still awaiting her  appointment with Dr.Dew and her vascular procedure is not scheduled until August 19. She will be seeing her PCP tomorrow and I have asked her to convey our discussion so that she is aware that debridement has not been done yet. 06/03/2015 -- was seen by her rheumatologist Dr. Dorthula Matas, who has been treating her for temporal arteritis and  in his note has mentioned the possibility of vasculitis or pyoderma gangrenosum. He is lowering her prednisone to 12-1/2 mg for 1 month and then 10 mg per the next month. I will again make an attempt to speak to her PCP Dr. Candiss Norse and her surgeon Dr. 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 06/17/2015 -- Dr.Dew did her vascular procedure last week and a follow-up venous ultrasound shows good resolution of the veins as per the patient's history. He is to see her back in 2 weeks. KIMIYE, BARMAN ([id number]) 07/07/2015. -- the patient has had a heavy growth of Proteus mirabilis and Enterococcus faecalis. These are sensitive to several drugs but the problem is she has allergies to all of these and hence I would like her to see Dr. Ola Spurr for this. She is also due to see Dr. Lucky Cowboy tomorrow and I will discussed the management with him including debridement under anesthesia and possible biopsies. 07/14/2015 -- she has an appointment to see Dr. Ola Spurr tomorrow and did see Dr. Bunnie Domino PA who will discuss my request with him. 07/21/2015 -- saw Dr. Ola Spurr was able to do a test on her and has put her on amoxicillin. She has been tolerating that and has had no problems with allergies to this. 07/28/2015 -- Last Friday I spoke to Dr. Leotis Pain regarding her care and he said that her right leg did not need any surgery and on the left leg was doing pretty good. We did agree that if she undergoes any procedure in the future he would do a couple of punch biopsies of the wound. 08/18/2015 -- her right leg is very tender and there is significant amount of slough. The left leg is looking much better 09/02/2015 -- she is going to have a debridement and punch biopsies of her right lower extremity by Dr. Leotis Pain this coming Thursday. Also seen Dr. Ola Spurr who has continued her on  ciprofloxacin. 09/09/2015 -- on 09/04/2015 Dr. Leotis Pain took her to surgery - Irrigation and excisional debridement of skin, soft tissue, and muscle to about 40 cm2 to the right posterior calf with biopsy. Pathology results are -- DIAGNOSIS: SKIN, RIGHT LOWER EXTREMITY; BIOPSY: SKIN AND SOFT TISSUE WITH ULCERATION, SEE NOTE. - NEGATIVE FOR DYSPLASIA AND MALIGNANCY. Note: There is acute inflammation and ulceration of the epidermis. The dermis shows nonspecific inflammation, neovascularization, and hemosiderin deposition. The differential diagnosis for these findings includes stasis dermatitis, nonspecific dermatitis, and infection. Correlation with clinical findings is required. A PAS fungal stain is obtained and results will be reported in an addendum. cultures were also sent and this grew Pseudomonas aeruginosa, Escherichia coli, Proteus mirabilis, Klebsiella pneumoniae and they were all sensitive to ciprofloxacin which she is on. Electronic Signature(s) Signed: 09/09/2015 2:57:08 PM By: [name], [name] Previous Signature: 09/09/2015 2:02:35 PM Version By: [name], [name] Previous Signature: 09/09/2015 2:00:39 PM Version By: [name], [name] Entered By: [name] on 09/09/2015 14:57:06 Pask, Dianah DMarland Kitchen ([id number]) -------------------------------------------------------------------------------- Physical Exam Details Patient Name: Wenrick, Nolene D.  09/09/2015 1:45 Date of Service: PM Medical Record [id number] Number: Patient Account Number: [id number] December 28, 1945 (68 y.o. Treating RN: [name] Date of Birth/Sex: Female) Other Clinician: Primary Care Physician: [facility], Delana Meyer Treating [name] Referring Physician: Glendon Axe Physician/Extender: Weeks in Treatment: 31 Constitutional . Pulse regular. Respirations normal and unlabored. Afebrile. . Eyes Nonicteric. Reactive to light. Ears, Nose, Mouth, and Throat Lips, teeth, and gums WNL.Marland Kitchen Moist mucosa  without lesions . Neck supple and nontender. No palpable supraclavicular or cervical adenopathy. Normal sized without goiter. Respiratory WNL. No retractions.. Cardiovascular Pedal Pulses WNL. No clubbing, cyanosis or edema. Gastrointestinal (GI) Abdomen without masses or tenderness.. No liver or spleen enlargement or tenderness.. Genitourinary (GU) No hydrocele, spermatocele, tenderness of the cord, or testicular mass.Marland Kitchen Penis without lesions.Lowella Fairy without lesions. No cystocele, or rectocele. Pelvic support intact, no discharge. Marland Kitchen Urethra without masses, tenderness or scarring.Marland Kitchen Lymphatic No adneopathy. No adenopathy. No adenopathy. Musculoskeletal Adexa without tenderness or enlargement.. Digits and nails w/o clubbing, cyanosis, infection, petechiae, ischemia, or inflammatory conditions.. Integumentary (Hair, Skin) No suspicious lesions. No crepitus or fluctuance. No peri-wound warmth or erythema. No masses.Marland Kitchen Psychiatric Judgement and insight Intact.. No evidence of depression, anxiety, or agitation.. Notes The left lower extremity is looking very clean and healthy and I believe will be ready for an Apligraf soon. The right lower extremity which has recently been debrided in the OR has minimal slough and the edges are sharply dissected and there is no surrounding cellulitis or pus drainage. Electronic Signature(s) YENESIS, PAWELEK ([id number]) Signed: 09/09/2015 2:58:06 PM By: [name], [name] Entered By: [name] on 09/09/2015 14:58:05 ITZAYANI, GUETTER ([id number]) -------------------------------------------------------------------------------- Physician Orders Details Patient Name: JAIDY, POFAHL D. 09/09/2015 1:45 Date of Service: PM Medical Record [id number] Number: Patient Account Number: [id number] 1946/02/08 (69 y.o. Treating RN: [name] Date of Birth/Sex: Female) Other Clinician: Primary Care Physician: [facility], Delana Meyer Treating [name] Referring  Physician: Glendon Axe Physician/Extender: Suella [place] in Treatment: 54 Verbal / Phone Orders: Yes Clinician: [name] Read Back and Verified: Yes Diagnosis Coding Wound Cleansing Wound #2 Left,Distal Lower Leg o Clean wound with Normal Saline. Wound #5 Right,Posterior Lower Leg o Clean wound with Normal Saline. Anesthetic Wound #2 Left,Distal Lower Leg o Topical Lidocaine 4% cream applied to wound bed prior to debridement Primary Wound Dressing Wound #2 Left,Distal Lower Leg o Other: - sorbact and maxisorb Wound #5 Right,Posterior Lower Leg o Other: - sorbact and maxisorb Dressing Change Frequency Wound #2 Left,Distal Lower Leg o Change dressing every other day. - daily if necessary due to drainage Wound #5 Right,Posterior Lower Leg o Change dressing every other day. - daily if necessary due to drainage Follow-up Appointments Wound #2 Left,Distal Lower Leg o Return Appointment in 1 week. Wound #5 Right,Posterior Lower Leg o Return Appointment in 1 week. o Return Appointment in 1 week. Edema Control Altland, Junette D. ([id number]) Wound #2 Left,Distal Lower Leg o Support Garment 20-30 mm/Hg pressure to: Wound #5 Right,Posterior Lower Leg o Support Garment 20-30 mm/Hg pressure to: Additional Orders / Instructions Wound #2 Left,Distal Lower Leg o Increase protein intake. Wound #5 Right,Posterior Lower Leg o Increase protein intake. Negative Pressure Wound Therapy Wound #5 Right,Posterior Lower Leg o Wound VAC settings at 125/130 mmHg continuous pressure. Use BLACK/GREEN foam to wound cavity. Use WHITE foam to fill any tunnel/s and/or undermining. Change VAC dressing 3 X WEEK. Change canister as indicated when full. Nurse may titrate settings and frequency of dressing changes as clinically indicated. o Home Health Nurse [phone]  d/c VAC for s/s of increased infection, significant wound regression, or uncontrolled drainage. [place] at [phone]. Medications-please add to medication list. Wound #2 Left,Distal Lower Leg o P.O. Antibiotics - Complete antibiotics prescribed by Dr. Ola Spurr Wound #5 Right,Posterior Lower Leg o P.O. Antibiotics - Complete antibiotics prescribed by Dr. Ola Spurr Electronic Signature(s) Signed: 09/09/2015 4:35:06 PM By: [name], [name] Signed: 09/09/2015 5:36:16 PM By: [name] RN, [name], [name], [name] Entered By: [name], RN, [name], Kim on 09/09/2015 14:22:05 AMBERLYN, LEISE ([id number]) -------------------------------------------------------------------------------- Problem List Details Patient Name: JAELEN, GOVERT 09/09/2015 1:45 Date of Service: PM Medical Record [id number] Number: Patient Account Number: [id number] Apr 16, 1946 (69 y.o. 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 ([id number]) Hussein, Ylonda D. ([id number]) -------------------------------------------------------------------------------- Progress Note  Details Patient Name: RADIAH, DONN D. 09/09/2015 1:45 Date of Service: PM Medical Record [id number] Number: Patient Account Number: [id number] [birth year] (69 y.o. Treating RN: [name] Date of Birth/Sex: Female) Other Clinician: Primary Care Physician: [facility], Delana Meyer Treating [name] Referring Physician: Glendon Axe Physician/Extender: Weeks in Treatment: 31 Subjective Chief Complaint Information obtained from Patient Patient presents to the wound care center for a consult due non healing wound 69 year old patient comes with a history of having a ulcer on the left lower extremity for the past 4 weeks. she says she's had swelling of both lower extremities for about a year after she started having prednisone. 02/07/2015 -- her vascular appointments obtained were in the first and third week of June. she is able to go to [place] and we will try and get her some earlier appointments. Other than that nothing else has changed in her management. History of Present Illness (HPI) 69 year old patient who is known to have diabetes mellitus type 2, chronic renal insufficiency, coronary artery disease, hypertension, hypercholesterolemia, temporal arteritis and inflammatory arthritiss also has a history of having a hysterectomy and some orthopedic related surgeries. The ulcer on the left lower extremity started off as a blister and then. Got progressively worse. She does not have any fever or chills and has not had any recent surgical intervention for this. Her last hemoglobin A1c was 10.1 in September 2015. She has been recently put on doxycycline by her PCP. She is now also allergic to doxycycline and was this was changed over to Keflex. 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 She does see a cardiologist and also takes a diuretic. 02/07/2015 her arterial and venous duplex  studies to be done have dates been given as the first and third week of June. This is at [facility]. We are going to try  and get early appointments at [facility]. other than that nothing has changed in her management. 02/14/2015 -- we have been able to get her an appointment in [facility] on May 20 which is much earlier than her previous ones at [place]. She continues with her prednisone and her sugars are in the range of 150-200. 02/21/2015 We were able to get a vascular lab workup for her today and she is going to be there at 2:00 this afternoon. the swelling of her leg has gone down significantly but she still has some tenderness over the wounds. 02/28/2015 - She has had one of two vascular workups done, and this coming Tuesday has another, at [facility]. She continues to be on steroid medications. She has significant sensitivity in her left lower extremity and has pain suggestive of neuropathic pain and I have asked her to address this with her primary care physician. 03/07/2015 -- The patient saw Dr. Lucky Cowboy for a consultation and he has had her arteries are okay but she has 2 incompetent veins on the left lower extremity and he is going to set her up for surgery. Official report is GABRIEL, PROCELL ([id number]) awaited. Addendum: Official reports are now available and on 03/04/2015. She was seen and lower extremity venous duplex exam was done. There was reflux present within the left greater saphenous vein below the knee and also the left small saphenous vein. Arterial duplex showed normal triphasic waveforms throughout the left lower extremity without any significant stenosis. Her ABIs were noncompressible bilaterally but a waveforms were normal and a digital pressures were normal bilaterally consistent with no significant arterial insufficiency. He has recommended endovenous ablation of both the left small saphenous and the left great saphenous vein. This  would still be scheduled later. 03/14/2015 -- she has heard back from the vascular office and has surgery scheduled for sometime in July. Her rheumatologist has decreased her prednisone dosage but she still on it. She has also had cataract surgery in her right eye recently this week. 03/20/2015 - No new complaints today. Pain improved. No fever or chills. Tolerating 2 layer compression. 04/14/2015 -- she was doing very well today she went off on vacation and now her edema has increased markedly the ulceration is bigger and her diabetes is not under control. 04/21/2015 -- I spoke to her PCP Dr. Candiss Norse and discussed the management which would include being seen by a general surgeon for debridement and taking multiple punch biopsies which would help in establishing the diagnosis of this is a vasculitis. She is agreeable about this and will set her up for the procedure with Dr. Tamala Julian at [facility]. She was seen by the surgeon Dr. Jamal Collin. 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 She is scheduled to have laser ablation done next week.Pt was sent here for possible biopsy to look for vasculitis. Feel it would be better to wait after laser ablation is completed- the ulcer may heal fully and biopsy may not be necessary 04/29/2015 -- she had the venous ablation done by Dr. Lucky Cowboy last Friday and we do not have any notes yet. She is doing fine otherwise. 05/06/2015 --Review of her recent vascular intervention shows that she was seen by Dr. Lucky Cowboy on 04/29/2015. The follow-up duplex which was done showed that both the great saphenous vein and the small saphenous vein remained patent with reflux consistent with an unsuccessful ablation. He has  rescheduled her for another the endovenous ablation to be done in about 4 weeks time. 05/13/2015 -- he was seen by her surgeon Dr. Jamal Collin who asked her  to continue with conservative therapy and he would speak to Dr. Lucky Cowboy about her management. Dr. Lucky Cowboy is going to schedule her surgery in the middle of August for a repeat endovenous ablation. Her pus culture from last week has grown : [place]upp her noted her sensitivity report but due to her multiple allergies I had tried clindamycin and she developed a rash with this too. She has been prescribed and anti-buttocks in the ER and is has it at home and she will let is know what she is going to be taking. 05/20/2015 -- she has developed a small spot on her right lower extremity but besides that it is not a full fledged ulceration. She did not get to see Dr. Lucky Cowboy last week and hopefully she will see him in the near future. 05/27/2015 -she is still awaiting her appointment with Dr.Dew and her vascular procedure is not scheduled until August 19. She will be seeing her PCP tomorrow and I have asked her to convey our discussion so that she is aware that debridement has not been done yet. DEEANN, KATONA ([id number]) 06/03/2015 -- was seen by her rheumatologist Dr. 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 He is lowering her prednisone to 12-1/2 mg for 1 month and then 10 mg per the next month. I will again make an attempt to speak to her PCP Dr. Candiss Norse and her surgeon Dr. 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 06/17/2015 -- Dr.Dew did her vascular procedure last week and a follow-up venous ultrasound shows good resolution of the veins as per the patient's history. He is to see her back in 2 weeks. 07/07/2015. -- the patient has had a heavy growth of Proteus mirabilis and Enterococcus faecalis.  These are sensitive to several drugs but the problem is she has allergies to all of these and hence I would like her to see Dr. Ola Spurr for this. She is also due to see Dr. Lucky Cowboy tomorrow and I will discussed the management with him including debridement under anesthesia and possible biopsies. 07/14/2015 -- she has an appointment to see Dr. Ola Spurr tomorrow and did see Dr. Bunnie Domino PA who will discuss my request with him. 07/21/2015 -- saw Dr. Ola Spurr was able to do a test on her and has put her on amoxicillin. She has been tolerating that and has had no problems with allergies to this. 07/28/2015 -- Last Friday I spoke to Dr. Leotis Pain regarding her care and he said that her right leg did not need any surgery and on the left leg was doing pretty good. We did agree that if she undergoes any procedure in the future he would do a couple of punch biopsies of the wound. 08/18/2015 -- her right leg is very tender and there is significant amount of slough. The left leg is looking much better 09/02/2015 -- she is going to have a debridement and punch biopsies of her right lower extremity by Dr. Leotis Pain this coming Thursday. Also seen Dr. Ola Spurr who has continued her on ciprofloxacin. 09/09/2015 -- on 09/04/2015 Dr. Leotis Pain  took her to surgery - Irrigation and excisional debridement of skin, soft tissue, and muscle to about 40 cm2 to the right posterior calf with biopsy. Pathology results are -- DIAGNOSIS: SKIN, RIGHT LOWER EXTREMITY; BIOPSY: SKIN AND SOFT TISSUE WITH ULCERATION, SEE NOTE. - NEGATIVE FOR DYSPLASIA AND MALIGNANCY. Note: There is acute inflammation and ulceration of the epidermis. The dermis shows nonspecific inflammation, neovascularization, and hemosiderin deposition. The differential diagnosis for these findings includes stasis dermatitis, nonspecific dermatitis, and infection. Correlation with clinical findings is required. A PAS fungal stain is obtained and results will  be reported in an addendum. cultures were also sent and this grew Pseudomonas aeruginosa, Escherichia coli, Proteus mirabilis, Klebsiella pneumoniae and they were all sensitive to ciprofloxacin which she is on. Objective Constitutional Pulse regular. Respirations normal and unlabored. Afebrile. Reading, Aleysia D. ([id number]) Vitals Time Taken: 2:03 PM, Height: 65 in, Weight: 248 lbs, BMI: 41.3, Temperature: 97.5 F, Pulse: 80 bpm, Respiratory Rate: 18 breaths/min, Blood Pressure: 158/60 mmHg. Eyes Nonicteric. Reactive to light. Ears, Nose, Mouth, and Throat Lips, teeth, and gums WNL.Marland Kitchen Moist mucosa without lesions . Neck supple and nontender. No palpable supraclavicular or cervical adenopathy. Normal sized without goiter. Respiratory WNL. No retractions.. Cardiovascular Pedal Pulses WNL. No clubbing, cyanosis or edema. Gastrointestinal (GI) Abdomen without masses or tenderness.. No liver or spleen enlargement or tenderness.. Genitourinary (GU) No hydrocele, spermatocele, tenderness of the cord, or testicular mass.Marland Kitchen Penis without lesions.Lowella Fairy without lesions. No cystocele, or rectocele. Pelvic support intact, no discharge. Marland Kitchen Urethra without masses, tenderness or scarring.Marland Kitchen Lymphatic No adneopathy. No adenopathy. No adenopathy. Musculoskeletal Adexa without tenderness or enlargement.. Digits and nails w/o clubbing, cyanosis, infection, petechiae, ischemia, or inflammatory conditions.Marland Kitchen Psychiatric Judgement and insight Intact.. No evidence of depression, anxiety, or agitation.. General Notes: The left lower extremity is looking very clean and healthy and I believe will be ready for an Apligraf soon. The right lower extremity which has recently been debrided in the OR has minimal slough and the edges are sharply dissected and there is no surrounding cellulitis or pus drainage. Integumentary (Hair, Skin) No suspicious lesions. No crepitus or fluctuance. No peri-wound warmth or  erythema. No masses.. Wound #2 status is Open. Original cause of wound was Blister. The wound is located on the Left,Distal Lower Leg. The wound measures 4.5cm length x 5cm width x 0.1cm depth; 17.671cm^2 area and 1.767cm^3 volume. Wound #5 status is Open. Original cause of wound was Gradually Appeared. The wound is located on the Right,Posterior Lower Leg. The wound measures 5cm length x 7cm width x 0.2cm depth; 27.489cm^2 area Walthour, Inza D. ([id number]) and 5.498cm^3 volume. 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 continuing on her ciprofloxacin and and talking to Dr. Ola Spurr whether he would like to continue this for a longer period of time 2. Applying for Apligraf for her left lower extremity which would benefit from cellular tissue-based product at this stage 3. Using Sorbact, bordered foam or extra absorptive foam to keep her secretions and check, and use her  compression stockings 4. She is the same dressing for the right leg until we are able to get a wound VAC in place. The wound VAC will be changed 3 times a week. 5. talking to her rheumatologist Dr. Precious Reel, and discussing with him the possibility of this being a pyoderma gangrenosum, and seeing if he is comfortable treating this appropriately. She has understood the treatment plan and keep up with all appointments and see me back next week. Plan Wound Cleansing: Wound #2  Left,Distal Lower Leg: Clean wound with Normal Saline. Wound #5 Right,Posterior Lower Leg: Clean wound with Normal Saline. Anesthetic: Wound #2 Left,Distal Lower Leg: Topical Lidocaine 4% cream applied to wound bed prior to debridement Primary Wound Dressing: DYNESTY, PRISBY. ([id number]) Wound #2 Left,Distal Lower Leg: Other: - sorbact and maxisorb Wound #5 Right,Posterior Lower Leg: Other: - sorbact and maxisorb Dressing Change Frequency: Wound #2 Left,Distal Lower Leg: Change dressing every other day. - daily if necessary due to drainage Wound #5 Right,Posterior Lower Leg: Change dressing every other day. - daily if necessary due to drainage Follow-up Appointments: Wound #2 Left,Distal Lower Leg: Return Appointment in 1 week. Wound #5 Right,Posterior Lower Leg: Return Appointment in 1 week. Return Appointment in 1 week. Edema Control: Wound #2 Left,Distal Lower Leg: Support Garment 20-30 mm/Hg pressure to: Wound #5 Right,Posterior Lower Leg: Support Garment 20-30 mm/Hg pressure to: Additional Orders / Instructions: Wound #2 Left,Distal Lower Leg: Increase protein intake. Wound #5 Right,Posterior Lower Leg: Increase protein intake. Negative Pressure Wound Therapy: Wound #5 Right,Posterior Lower Leg: Wound VAC settings at 125/130 mmHg continuous pressure. Use BLACK/GREEN foam to wound cavity. Use WHITE foam to fill any tunnel/s and/or undermining. Change VAC dressing 3 X WEEK. Change canister as indicated when full. Nurse may titrate settings and frequency of dressing changes as clinically indicated. Home Health Nurse may d/c VAC for s/s of increased infection, significant wound regression, or uncontrolled drainage. [place] at [phone]. Medications-please add to medication list.: Wound #2 Left,Distal Lower Leg: P.O. Antibiotics - Complete antibiotics prescribed by Dr. Ola Spurr Wound #5 Right,Posterior Lower Leg: P.O. Antibiotics - Complete  antibiotics prescribed by Dr. Ola Spurr After reviewing her recent operative note, pathology and culture reports are have recommended: 1. continuing on her ciprofloxacin and and talking to Dr. Ola Spurr whether he would like to continue this for a longer period of time 2. Applying for Apligraf for her left lower extremity which would benefit from cellular tissue-based product at this stage DEERICKA, LUNNY D. ([id number]) 3. Using Sorbact, bordered foam or extra absorptive foam to keep her secretions and check, and use her compression stockings 4. She is the same dressing for the right leg until we are able to get a wound VAC in place. The wound VAC will be changed 3 times a week. 5. talking to her rheumatologist Dr. Precious Reel, and discussing with him the possibility of this being a pyoderma gangrenosum, and seeing if he is comfortable treating this appropriately. She has understood the treatment plan and keep up with all appointments and see me back next week. Electronic Signature(s) Signed: 09/09/2015 3:02:18 PM By: [name], [name] Entered By: [name] on 09/09/2015 15:02:17 Coker, Tameah D. ([id number]) -------------------------------------------------------------------------------- SuperBill Details Patient Name: LAKENZIE, STONEBURNER D. Date of Service: 09/09/2015 Medical Record Number: [id number] Patient Account Number: [id number] Date of Birth/Sex: 02-Dec-1945 (68 y.o. 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 L03.116 Cellulitis of left lower limb Facility Procedures CPT4 Code: [id number] Description: 99213 - WOUND CARE VISIT-LEV 3 EST PT Modifier: Quantity: 1 Physician Procedures CPT4: Description Modifier Quantity Code [id number] - WC PHYS LEVEL 4 - EST PT 1 ICD-10 Description Diagnosis E11.622 Type 2 diabetes mellitus with other skin ulcer L97.322 Non-pressure chronic ulcer of left ankle with fat layer exposed E66.01 Morbid  (severe) obesity due to excess calories 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

## 2015-09-16 ENCOUNTER — Encounter: Payer: PPO | Admitting: Surgery

## 2015-09-16 DIAGNOSIS — E11622 Type 2 diabetes mellitus with other skin ulcer: Secondary | ICD-10-CM | POA: Diagnosis not present

## 2015-09-17 NOTE — Progress Notes (Signed)
[name] ([id number]) Visit Report for 09/16/2015 Chief Complaint Document Details Patient Name: [name], [name] 09/16/2015 11:00 Date of Service: AM Medical Record [id number] Number: Patient Account Number: [id number] 08-20-1946 (69 y.o. Treating RN: [name] Date of Birth/Sex: Female) Other Clinician: Primary Care Treating [name], [name] Physician: Physician/Extender: Referring Physician: Glendon [name] Weeks in Treatment: 32 Information Obtained from: Patient Chief Complaint Patient presents to the wound care [name] for a consult due non healing wound 69 year old patient comes with a history of having a ulcer on the left lower extremity for the past 4 weeks. she says she's had swelling of both lower extremities for about a year after she started having prednisone. 02/07/2015 -- her vascular appointments obtained were in the first and third week of June. she is able to go to [place] and we will try and get her some earlier appointments. Other than that nothing else has changed in her management. Electronic Signature(s) Signed: 09/16/2015 11:48:01 AM By: Kristin Fudge MD, [name] Entered By: [name] on 09/16/2015 11:48:01 [name], [name] ([id number]) -------------------------------------------------------------------------------- HPI Details Patient Name: [name], [name] 09/16/2015 11:00 Date of Service: AM Medical Record [id number] Number: Patient Account Number: [id number] Mar 13, 1946 (69 y.o. Treating RN: [name] Date of Birth/Sex: Female) Other Clinician: Primary Care Treating [facility], [name], [name] Physician: Physician/Extender: Referring Physician: Glendon [name] Weeks in Treatment: 69 History of Present Illness HPI Description: 69 year old patient who is known to have diabetes mellitus type 2, chronic renal insufficiency, coronary artery disease, hypertension, hypercholesterolemia, temporal arteritis and inflammatory  arthritiss also has a history of having a hysterectomy and some orthopedic related surgeries. The ulcer on the left lower extremity started off as a blister and then. Got progressively worse. She does not have any fever or chills and has not had any recent surgical intervention for this. Her last hemoglobin A1c was 10.1 in September 2015. She has been recently put on doxycycline by her PCP. She is now also allergic to doxycycline and was this was changed over to Keflex. 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 She does see a cardiologist and also takes a diuretic. 02/07/2015 her arterial and venous duplex studies to be done have dates been given as the first and third week of June. This is at [name] We are going to try and get early appointments at [facility]. other than that nothing has changed in her management. 02/14/2015 -- we have been able to get her an appointment in [facility] on May 20 which is much earlier than her previous ones at [place]. She continues with her prednisone and her sugars are in the range of 150-200. 02/21/2015 We were able to get a vascular lab workup for her today and she is going to be there at 2:00 this afternoon. the swelling of her leg has gone down significantly but she still has some tenderness over the wounds. 02/28/2015 - She has had one of two vascular workups done, and this coming Tuesday has another, at [facility]. She continues to be on steroid medications. She has significant sensitivity in her left lower extremity and has pain suggestive of neuropathic pain and I have asked her to address this with her primary care physician. 03/07/2015 -- The patient saw [name] for a consultation and he has had her arteries are okay but she has 2 incompetent veins on the  left lower extremity and he is going to set her up  for surgery. Official report is awaited. Addendum: Official reports are now available and on 03/04/2015. She was seen and lower extremity venous duplex exam was done. There was reflux present within the left greater saphenous vein below the knee and also the left small saphenous vein. Arterial duplex showed normal triphasic waveforms throughout the left lower extremity without any significant stenosis. Her ABIs were noncompressible bilaterally but a waveforms were normal and a digital pressures were normal bilaterally consistent with no significant arterial insufficiency. He has recommended endovenous ablation of both the left small saphenous and the left great saphenous vein. This would still be scheduled later. [name], [name] ([id number]) 03/14/2015 -- she has heard back from the vascular office and has surgery scheduled for sometime in July. Her rheumatologist has decreased her prednisone dosage but she still on it. She has also had cataract surgery in her right eye recently this week. 03/20/2015 - No new complaints today. Pain improved. No fever or chills. Tolerating 2 layer compression. 04/14/2015 -- she was doing very well today she went off on vacation and now her edema has increased markedly the ulceration is bigger and her diabetes is not under control. 04/21/2015 -- I spoke to her PCP Dr. Candiss [name] and discussed the management which would include being seen by a general surgeon for debridement and taking multiple punch biopsies which would help in establishing the diagnosis of this is a vasculitis. She is agreeable about this and will set her up for the procedure with Dr. Tamala Julian at [facility]. She was seen by the surgeon Dr. Jamal [name] 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 She is scheduled to have laser ablation done next week.Pt was sent here for possible biopsy to  look for vasculitis. Feel it would be better to wait after laser ablation is completed- the ulcer may heal fully and biopsy may not be necessary 04/29/2015 -- she had the venous ablation done by [name] last Friday and we do not have any notes yet. She is doing fine otherwise. 05/06/2015 --Review of her recent vascular intervention shows that she was seen by [name] on 04/29/2015. The follow-up duplex which was done showed that both the great saphenous vein and the small saphenous vein remained patent with reflux consistent with an unsuccessful ablation. He has rescheduled her for another the endovenous ablation to be done in about 4 weekso time. 05/13/2015 -- he was seen by her surgeon Dr. Jamal [name] who asked her to continue with conservative therapy and he would speak to [name] about her management. [name] is going to schedule her surgery in the middle of August for a repeat endovenous ablation. Her pus culture from last week has grown : [place] her noted her sensitivity report but due to her multiple allergies I had tried clindamycin and she developed a rash with this too. She has been prescribed and anti-buttocks in the ER and is has it at home and she will let is know what she is going to be taking. 05/20/2015 -- she has developed a small spot on her right lower extremity but besides that it is not a full fledged ulceration. She did not get to see [name] last week and hopefully she will see him in the near future. 05/27/2015 -she is still awaiting her  appointment with KristinDew and her vascular procedure is not scheduled until August 19. She will be seeing her PCP tomorrow and I have asked her to convey our discussion so that she is aware that debridement has not been done yet. 06/03/2015 -- was seen by her rheumatologist Dr. Dorthula Matas, who has been treating her for temporal  arteritis and in his note has mentioned the possibility of vasculitis or pyoderma gangrenosum. He is lowering her prednisone to 12-1/2 mg for 1 month and then 10 mg per the next month. I will again make an attempt to speak to her PCP Dr. Candiss [name] and her surgeon [name] to see if he can organize for a debridement in the OR with multiple biopsies to establish a diagnosis of vasculitis or pyoderma gangrenosum. 06/17/2015 -- KristinDew did her vascular procedure last week and a follow-up venous ultrasound shows good Melone, Yun D. ([id number]) resolution of the veins as per the patient's history. He is to see her back in 2 weeks. 07/07/2015. -- the patient has had a heavy growth of Proteus mirabilis and Enterococcus faecalis. These are sensitive to several drugs but the problem is she has allergies to all of these and hence I would like her to see Dr. Ola Spurr for this. She is also due to see [name] tomorrow and I will discussed the management with him including debridement under anesthesia and possible biopsies. 07/14/2015 -- she has an appointment to see Dr. Ola Spurr tomorrow and did see Dr. Bunnie Domino PA who will discuss my request with him. 07/21/2015 -- saw Dr. Ola Spurr was able to do a test on her and has put her on amoxicillin. She has been tolerating that and has had no problems with allergies to this. 07/28/2015 -- Last Friday I spoke to Dr. Leotis Pain regarding her care and he said that her right leg did not need any surgery and on the left leg was doing pretty good. We did agree that if she undergoes any procedure in the future he would do a couple of punch biopsies of the wound. 08/18/2015 -- her right leg is very tender and there is significant amount of slough. The left leg is looking much better 09/02/2015 -- she is going to have a debridement and punch biopsies of her right lower extremity by Dr. Leotis Pain this coming Thursday. Also seen Dr. Ola Spurr who has continued her on  ciprofloxacin. 09/09/2015 -- on 09/04/2015 Dr. Leotis Pain took her to surgery - Irrigation and excisional debridement of skin, soft tissue, and muscle to about 40 cm2 to the right posterior calf with biopsy. Pathology results are -- DIAGNOSIS: SKIN, RIGHT LOWER EXTREMITY; BIOPSY: SKIN AND SOFT TISSUE WITH ULCERATION, SEE NOTE. - NEGATIVE FOR DYSPLASIA AND MALIGNANCY. Note: There is acute inflammation and ulceration of the epidermis. The dermis shows nonspecific inflammation, neovascularization, and hemosiderin deposition. The differential diagnosis for these findings includes stasis dermatitis, nonspecific dermatitis, and infection. Correlation with clinical findings is required. A PAS fungal stain is obtained and results will be reported in an addendum. cultures were also sent and this grew Pseudomonas aeruginosa, Escherichia coli, Proteus mirabilis, Klebsiella pneumoniae and they were all sensitive to ciprofloxacin which she is on. 09/16/2015 -- he saw Dr. 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 He is going to call the patient in and discuss with her the management possibly with Imuran. I will continue treating  her locally. Electronic Signature(s) Signed: 09/16/2015 11:56:18 AM By: Kristin Fudge MD, [name] Entered By: [name] on 09/16/2015 11:56:17 [name], [name] ([id number]) -------------------------------------------------------------------------------- Physical Exam Details Patient Name: [name], [name] 09/16/2015 11:00 Date of Service: AM Medical Record [id number] Number: Patient Account Number: [id number] Feb 05, 1946 (69 y.o. Treating RN: [name] Date of Birth/Sex: Female) Other Clinician: Primary Care Treating [name], [name] Physician: Physician/Extender: Referring Physician: Candiss [name], [name] Weeks in Treatment: 32 Constitutional .  Pulse regular. Respirations normal and unlabored. Afebrile. . Eyes Nonicteric. Reactive to light. Ears, Nose, Mouth, and Throat Lips, teeth, and gums WNL.Marland Kitchen Moist mucosa without lesions . Neck supple and nontender. No palpable supraclavicular or cervical adenopathy. Normal sized without goiter. Respiratory WNL. No retractions.. Cardiovascular Pedal Pulses WNL. No clubbing, cyanosis or edema. Chest Breasts symmetical and no nipple discharge.. Breast tissue WNL, no masses, lumps, or tenderness.. Lymphatic No adneopathy. No adenopathy. No adenopathy. Musculoskeletal Adexa without tenderness or enlargement.. Digits and nails w/o clubbing, cyanosis, infection, petechiae, ischemia, or inflammatory conditions.. Integumentary (Hair, Skin) No suspicious lesions. No crepitus or fluctuance. No peri-wound warmth or erythema. No masses.Marland Kitchen Psychiatric Judgement and insight Intact.. No evidence of depression, anxiety, or agitation.. Notes the left lower extremity is looking fairly clean and I have washed this out with saline and removed some of the debris. This leg is ready for Apligraf and we will order this for next week. Right lower extremity is extremely tender has a lot of slough and we will change over to Santyl ointment. Her wound VAC is still pending authorization. Electronic Signature(s) Signed: 09/16/2015 11:57:20 AM By: Kristin Fudge MD, [name] Entered By: [name] on 09/16/2015 11:57:20 [name], [name] ([id number]) [name], [name] ([id number]) -------------------------------------------------------------------------------- Physician Orders Details Patient Name: [name], [name] 09/16/2015 11:00 Date of Service: AM Medical Record [id number] Number: Patient Account Number: [id number] [birth year] (69 y.o. Treating RN: Ahmed Prima Date of Birth/Sex: Female) Other Clinician: Primary Care Treating [place], [place], Syd Newsome Physician: Physician/Extender: Referring Physician:  Glendon [name] Weeks in Treatment: 95 Verbal / Phone Orders: Yes Clinician: Pinkerton, Debi Read Back and Verified: Yes Diagnosis Coding Wound Cleansing Wound #2 Left,Distal Lower Leg o Clean wound with Normal Saline. Wound #5 Right,Posterior Lower Leg o Clean wound with Normal Saline. Anesthetic Wound #2 Left,Distal Lower Leg o Topical Lidocaine 4% cream applied to wound bed prior to debridement Wound #5 Right,Posterior Lower Leg o Topical Lidocaine 4% cream applied to wound bed prior to debridement Primary Wound Dressing Wound #2 Left,Distal Lower Leg o Other: - siltec sorbact Wound #5 Right,Posterior Lower Leg o Santyl Ointment Secondary Dressing Wound #5 Right,Posterior Lower Leg o Boardered Foam Dressing Follow-up Appointments Wound #2 Left,Distal Lower Leg o Return Appointment in 1 week. Wound #5 Right,Posterior Lower Leg o Return Appointment in 1 week. Edema Control Demuro, Orissa D. ([id number]) Wound #2 Left,Distal Lower Leg o Other: - carolon 2 layer - toes in stockings Wound #5 Right,Posterior Lower Leg o Other: - carolon 2 layer - toes in stockings Electronic Signature(s) Signed: 09/16/2015 4:16:33 PM By: Alric Quan Signed: 09/16/2015 4:47:37 PM By: Kristin Fudge MD, [name] Entered By: Alric Quan on 09/16/2015 11:52:19 [name], YANIELIZ SUMMERLIN ([id number]) -------------------------------------------------------------------------------- Problem List Details Patient Name: SHARY, FENG D. 09/16/2015 11:00 Date of Service: AM Medical Record [id number] Number: Patient Account Number: [id number] [birth year] (69 y.o. 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 ([id number]) Entered By: [name] on 09/16/2015 11:47:49 Rickert, Antanasia D. ([id number]) -------------------------------------------------------------------------------- Progress Note Details Patient Name: TERRILYNN, ROUNTREE D. 09/16/2015 11:00 Date of Service: AM Medical Record [id number] Number: Patient Account Number: [id number] 1946-05-20 (69 y.o. Treating RN: [name] Date of Birth/Sex: Female) Other Clinician: Primary Care Treating [name], [name] Physician: Physician/Extender: Referring Physician: Glendon [name] Weeks in Treatment: 32 Subjective Chief Complaint Information obtained from Patient Patient presents to the wound care [name] for a consult due non healing wound 69 year old patient comes with a history of having a ulcer on the left lower extremity for the past 4 weeks. she says she's had swelling of both lower extremities for about a year after she started having prednisone. 02/07/2015 -- her vascular appointments obtained were in the first and third week of June. she is able to go to [place] and we will try and get her some earlier appointments. Other than that nothing else has changed in her management. History of Present Illness (HPI) 69 year old patient who is known to have diabetes mellitus type 2, chronic renal  insufficiency, coronary artery disease, hypertension, hypercholesterolemia, temporal arteritis and inflammatory arthritiss also has a history of having a hysterectomy and some orthopedic related surgeries. The ulcer on the left lower extremity started off as a blister and then. Got progressively worse. She does not have any fever or chills and has not had any recent surgical intervention for this. Her last hemoglobin A1c was 10.1 in September 2015. She has been recently put on doxycycline by her PCP. She is now also allergic to doxycycline and was this was changed over to Keflex. 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 She does see a cardiologist and also takes a diuretic. 02/07/2015 her arterial and venous duplex studies to be done have dates been given as the first and third week of June. This is at [facility]. We are going to try and get early appointments at [facility]. other than that nothing has changed in her management. 02/14/2015 -- we have been able to get her an appointment in [facility] on May 20 which is much earlier than her previous ones at [place]. She continues with her prednisone and her sugars are in the range of 150-200. 02/21/2015 We were able to get a vascular lab workup for her today and she is going to be there at 2:00 this afternoon. the swelling of her leg has gone down significantly but she still has some tenderness over the wounds. 02/28/2015 - She has had one of two vascular workups done, and this coming Tuesday has another, at [facility]. She continues to be on steroid medications. She has significant sensitivity in her left lower extremity and has pain suggestive of neuropathic pain and I have asked her  to address this with her primary care physician. 03/07/2015 -- The patient saw [name] for a consultation and he has had  her arteries are okay but she has Congrove, Lashea D. ([id number]) 2 incompetent veins on the left lower extremity and he is going to set her up for surgery. Official report is awaited. Addendum: Official reports are now available and on 03/04/2015. She was seen and lower extremity venous duplex exam was done. There was reflux present within the left greater saphenous vein below the knee and also the left small saphenous vein. Arterial duplex showed normal triphasic waveforms throughout the left lower extremity without any significant stenosis. Her ABIs were noncompressible bilaterally but a waveforms were normal and a digital pressures were normal bilaterally consistent with no significant arterial insufficiency. He has recommended endovenous ablation of both the left small saphenous and the left great saphenous vein. This would still be scheduled later. 03/14/2015 -- she has heard back from the vascular office and has surgery scheduled for sometime in July. Her rheumatologist has decreased her prednisone dosage but she still on it. She has also had cataract surgery in her right eye recently this week. 03/20/2015 - No new complaints today. Pain improved. No fever or chills. Tolerating 2 layer compression. 04/14/2015 -- she was doing very well today she went off on vacation and now her edema has increased markedly the ulceration is bigger and her diabetes is not under control. 04/21/2015 -- I spoke to her PCP Dr. Candiss [name] and discussed the management which would include being seen by a general surgeon for debridement and taking multiple punch biopsies which would help in establishing the diagnosis of this is a vasculitis. She is agreeable about this and will set her up for the procedure with Dr. Tamala Julian at [facility]. She was seen by the surgeon Dr. Jamal [name] His opinion was: Likely stasis ulcer left leg.Venous insufficiency- pt had venous Duplex and appears she has superficial venous  insdufficiency. She is scheduled to have laser ablation done next week.Pt was sent here for possible biopsy to look for vasculitis. Feel it would be better to wait after laser ablation is completed- the ulcer may heal fully and biopsy may not be necessary 04/29/2015 -- she had the venous ablation done by [name] last Friday and we do not have any notes yet. She is doing fine otherwise. 05/06/2015 --Review of her recent vascular intervention shows that she was seen by [name] on 04/29/2015. The follow-up duplex which was done showed that both the great saphenous vein and the small saphenous vein remained patent with reflux consistent with an unsuccessful ablation. He has rescheduled her for another the endovenous ablation to be done in about 4 weeks time. 05/13/2015 -- he was seen by her surgeon Dr. Jamal [name] who asked her to continue with conservative therapy and he would speak to [name] about her management. [name] is going to schedule her surgery in the middle of August for a repeat endovenous ablation. Her pus culture from last week has grown : [place] her noted her sensitivity report but due to her multiple allergies I had tried clindamycin and she developed a rash with this too. She has been prescribed and anti-buttocks in the ER and is has it at home and she will let is know what she is going to be taking. 05/20/2015 -- she has developed a small spot on her right lower extremity but besides  that it is not a full fledged ulceration. She did not get to see [name] last week and hopefully she will see him in the near future. 05/27/2015 -she is still awaiting her appointment with KristinDew and her vascular procedure is not scheduled until August 19. She will be seeing her PCP tomorrow and I have asked her to convey our discussion so [name], [name] ([id number]) that she is aware that debridement has  not been done yet. 06/03/2015 -- was seen by her rheumatologist Dr. 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 He is lowering her prednisone to 12-1/2 mg for 1 month and then 10 mg per the next month. I will again make an attempt to speak to her PCP Dr. Candiss [name] and her surgeon [name] to see if he can organize for a debridement in the OR with multiple biopsies to establish a diagnosis of vasculitis or pyoderma gangrenosum. 06/17/2015 -- KristinDew did her vascular procedure last week and a follow-up venous ultrasound shows good resolution of the veins as per the patient's history. He is to see her back in 2 weeks. 07/07/2015. -- the patient has had a heavy growth of Proteus mirabilis and Enterococcus faecalis. These are sensitive to several drugs but the problem is she has allergies to all of these and hence I would like her to see Dr. Ola Spurr for this. She is also due to see [name] tomorrow and I will discussed the management with him including debridement under anesthesia and possible biopsies. 07/14/2015 -- she has an appointment to see Dr. Ola Spurr tomorrow and did see Dr. Bunnie Domino PA who will discuss my request with him. 07/21/2015 -- saw Dr. Ola Spurr was able to do a test on her and has put her on amoxicillin. She has been tolerating that and has had no problems with allergies to this. 07/28/2015 -- Last Friday I spoke to Dr. Leotis Pain regarding her care and he said that her right leg did not need any surgery and on the left leg was doing pretty good. We did agree that if she undergoes any procedure in the future he would do a couple of punch biopsies of the wound. 08/18/2015 -- her right leg is very tender and there is significant amount of slough. The left leg is looking much better 09/02/2015 -- she is going to have a debridement and punch biopsies of her right lower  extremity by Dr. Leotis Pain this coming Thursday. Also seen Dr. Ola Spurr who has continued her on ciprofloxacin. 09/09/2015 -- on 09/04/2015 Dr. Leotis Pain took her to surgery - Irrigation and excisional debridement of skin, soft tissue, and muscle to about 40 cm2 to the right posterior calf with biopsy. Pathology results are -- DIAGNOSIS: SKIN, RIGHT LOWER EXTREMITY; BIOPSY: SKIN AND SOFT TISSUE WITH ULCERATION, SEE NOTE. - NEGATIVE FOR DYSPLASIA AND MALIGNANCY. Note: There is acute inflammation and ulceration of the epidermis. The dermis shows nonspecific inflammation, neovascularization, and hemosiderin deposition. The differential diagnosis for these findings includes stasis dermatitis, nonspecific dermatitis, and infection. Correlation with clinical findings is required. A PAS fungal stain is obtained and results will be reported in an addendum. cultures were also sent and this grew Pseudomonas aeruginosa, Escherichia coli, Proteus mirabilis, Klebsiella pneumoniae and they were all sensitive to ciprofloxacin which she is on. 09/16/2015 -- he saw Dr. 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 He is going to call the patient in and discuss with her the management possibly with Imuran. I will continue treating her locally. Objective [name], [name] ([id number]) Constitutional Pulse regular. Respirations normal and unlabored. Afebrile. Vitals Time Taken: 11:13 AM, Height: 65 in, Weight: 248 lbs, BMI: 41.3, Temperature: 97.6 F, Pulse: 75 bpm, Respiratory Rate: 16 breaths/min, Blood Pressure: 139/53 mmHg. Eyes Nonicteric. Reactive to light. Ears, Nose, Mouth, and Throat Lips, teeth, and gums WNL.Marland Kitchen Moist mucosa without lesions . Neck supple and nontender. No palpable supraclavicular or cervical adenopathy. Normal sized without goiter. Respiratory WNL. No  retractions.. Cardiovascular Pedal Pulses WNL. No clubbing, cyanosis or edema. Chest Breasts symmetical and no nipple discharge.. Breast tissue WNL, no masses, lumps, or tenderness.. Lymphatic No adneopathy. No adenopathy. No adenopathy. Musculoskeletal Adexa without tenderness or enlargement.. Digits and nails w/o clubbing, cyanosis, infection, petechiae, ischemia, or inflammatory conditions.Marland Kitchen Psychiatric Judgement and insight Intact.. No evidence of depression, anxiety, or agitation.. General Notes: the left lower extremity is looking fairly clean and I have washed this out with saline and removed some of the debris. This leg is ready for Apligraf and we will order this for next week. Right lower extremity is extremely tender has a lot of slough and we will change over to Santyl ointment. Her wound VAC is still pending authorization. Integumentary (Hair, Skin) No suspicious lesions. No crepitus or fluctuance. No peri-wound warmth or erythema. No masses.. Wound #2 status is Open. Original cause of wound was Blister. The wound is located on the Left,Distal Lower Leg. The wound measures 4.5cm length x 5.2cm width x 0.1cm depth; 18.378cm^2 area and 1.838cm^3 volume. The wound is limited to skin breakdown. There is no tunneling or undermining noted. There is a medium amount of serosanguineous drainage noted. The wound margin is thickened. There is medium (34-66%) pink granulation within the wound bed. There is a medium (34-66%) amount of necrotic Escamilla, Titiana D. ([id number]) tissue within the wound bed including Adherent Slough. The periwound skin appearance exhibited: Moist, Hemosiderin Staining. The periwound skin appearance did not exhibit: Callus, Crepitus, Excoriation, Fluctuance, Friable, Induration, Localized Edema, Rash, Scarring, Dry/Scaly, Maceration, Atrophie Blanche, Cyanosis, Ecchymosis, Mottled, Pallor, Rubor, Erythema. Wound #5 status is Open. Original cause of wound was  Gradually Appeared. The wound is located on the Right,Posterior Lower Leg. The wound measures 5cm length x 7cm width x 0.8cm depth; 27.489cm^2 area and 21.991cm^3 volume. The wound is limited to skin breakdown. There is no tunneling or undermining noted. There is a large amount of serosanguineous drainage noted. The wound margin is thickened. There is medium (34-66%) pink granulation within the wound bed. There is a medium (34-66%) amount of necrotic tissue within the wound bed including Adherent Slough. The periwound skin appearance exhibited: Moist, Hemosiderin Staining. The periwound skin appearance did not exhibit: Callus, Crepitus, Excoriation, Fluctuance, Friable, Induration, Localized Edema, Rash, Scarring, Dry/Scaly, Maceration, Atrophie Blanche, Cyanosis, Ecchymosis, Mottled, Pallor, Rubor, Erythema. 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 This leg is ready for Apligraf and we will order this for next week. Until then we'll use Siltec Sorbact and her compression stockings. Right lower extremity is extremely tender has a lot of slough and we will change over to Santyl ointment. Her wound VAC is still pending authorization. I discussed the need to see Dr. Jefm Bryant in his clinic to discuss further treatment of pyoderma gangrenosum. Plan Wound Cleansing: Wound #2 Left,Distal Lower Leg: Vore, Jameria D. ([id number]) Clean wound  with Normal Saline. Wound #5 Right,Posterior Lower Leg: Clean wound with Normal Saline. Anesthetic: Wound #2 Left,Distal Lower Leg: Topical Lidocaine 4% cream applied to wound bed prior to debridement Wound #5 Right,Posterior Lower Leg: Topical Lidocaine 4% cream applied to wound bed prior to debridement Primary Wound Dressing: Wound #2 Left,Distal Lower Leg: Other: - siltec sorbact Wound #5 Right,Posterior Lower Leg: Santyl Ointment Secondary Dressing: Wound #5 Right,Posterior Lower Leg: Boardered Foam Dressing Follow-up Appointments: Wound #2 Left,Distal Lower Leg: Return Appointment in 1 week. Wound #5 Right,Posterior Lower Leg: Return Appointment in 1 week. Edema Control: Wound #2 Left,Distal Lower Leg: Other: - carolon 2 layer - toes in stockings Wound #5 Right,Posterior Lower Leg: Other: - carolon 2 layer - toes in stockings The left lower extremity is looking fairly clean and I have washed this out with saline and removed some of the debris. This leg is ready for Apligraf and we will order this for next week. Until then we'll use Siltec Sorbact and her compression stockings. Right lower extremity is extremely tender has a lot of slough and we will change over to Santyl ointment. Her wound VAC is still pending authorization. I discussed the need to see Dr. Jefm Bryant in his clinic to discuss further treatment of pyoderma gangrenosum. Electronic Signature(s) Signed: 09/16/2015 11:58:50 AM By: Kristin Fudge MD, [name] Entered By: [name] on 09/16/2015 11:58:50 Worm, Parthenia D. ([id number]) -------------------------------------------------------------------------------- SuperBill Details Patient Name: LATESSA, PEROVICH D. Date of Service: 09/16/2015 Medical Record Number: [id number] Patient Account Number: [id number] Date of Birth/Sex: February 15, 1946 (68 y.o. 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 L03.116 Cellulitis of left lower limb Facility Procedures CPT4 Code: [id number] Description: [id number] - WOUND CARE VISIT-LEV 2 EST PT Modifier: Quantity: 1 Physician Procedures CPT4: Description Modifier Quantity Code [id number] - WC PHYS LEVEL 3 - EST PT 1 ICD-10 Description Diagnosis E11.622 Type 2 diabetes mellitus with other skin ulcer L97.322 Non-pressure chronic ulcer of left ankle with fat layer exposed I89.0  Lymphedema, not elsewhere classified I83.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

## 2015-09-17 NOTE — Progress Notes (Signed)
[name] ([id number]) Visit Report for 09/16/2015 Arrival Information Details Patient Name: [name], [name] 09/16/2015 11:00 Date of Service: AM Medical Record [id number] Number: Patient Account Number: [id number] 1946/05/09 (69 y.o. Treating RN: [name] Date of Birth/Sex: Female) Other Clinician: Primary Care Physician: [name] Treating [name] Referring Physician: Glendon [name] Physician/Extender: [name]: 28 Visit Information History Since Last Visit Added or deleted any medications: No Patient Arrived: Cane Any new allergies or adverse reactions: No Arrival Time: 11:10 Had a fall or experienced change in No Accompanied By: self activities of daily living that may affect Transfer Assistance: None risk of falls: Patient Identification Verified: Yes Signs or symptoms of abuse/neglect since last No Secondary Verification Process Yes visito Completed: Hospitalized since last visit: No Patient Has Alerts: Yes Has Dressing in Place as Prescribed: Yes Patient Alerts: Patient on Blood Pain Present Now: No Thinner Electronic Signature(s) Signed: 09/16/2015 5:37:09 PM By: Kristin Cool, RN, [name], [name] Entered By: [name] on 09/16/2015 11:12:30 [name] ([id number]) -------------------------------------------------------------------------------- Clinic Level of Care Assessment Details Patient Name: [name], [name] 09/16/2015 11:00 Date of Service: AM Medical Record [id number] Number: Patient Account Number: [id number] 1946-08-22 (68 y.o. Treating RN: [name] Date of Birth/Sex: Female) Other Clinician: Primary Care Physician: [name] Treating [name] Referring Physician: Glendon [name] Physician/Extender: [name]: 32 Clinic Level of Care Assessment Items TOOL 4 Quantity Score []  - Use when only an EandM is performed on FOLLOW-UP visit 0 ASSESSMENTS - Nursing Assessment /  Reassessment []  - Reassessment of Co-morbidities (includes updates in patient status) 0 []  - Reassessment of Adherence to [name] Plan 0 ASSESSMENTS - Wound and Skin Assessment / Reassessment []  - Simple Wound Assessment / Reassessment - one wound 0 X - Complex Wound Assessment / Reassessment - multiple wounds 1 5 []  - Dermatologic / Skin Assessment (not related to wound area) 0 ASSESSMENTS - Focused Assessment []  - Circumferential Edema Measurements - multi extremities 0 []  - Nutritional Assessment / Counseling / Intervention 0 []  - Lower Extremity Assessment (monofilament, tuning fork, pulses) 0 []  - Peripheral Arterial Disease Assessment (using hand held doppler) 0 ASSESSMENTS - Ostomy and/or Continence Assessment and Care []  - Incontinence Assessment and Management 0 []  - Ostomy Care Assessment and Management (repouching, etc.) 0 PROCESS - Coordination of Care []  - Simple Patient / Family Education for ongoing care 0 X - Complex (extensive) Patient / Family Education for ongoing care 1 20 []  - Staff obtains Programmer, systems, Records, Test Results / Process Orders 0 []  - Staff telephones HHA, Nursing Homes / Clarify orders / etc 0 [name], [name] ([id number]) []  - Routine Transfer to another Facility (non-emergent condition) 0 []  - Routine Hospital Admission (non-emergent condition) 0 []  - New Admissions / Biomedical engineer / Ordering NPWT, Apligraf, etc. 0 []  - Emergency Hospital Admission (emergent condition) 0 []  - Simple Discharge Coordination 0 []  - Complex (extensive) Discharge Coordination 0 PROCESS - Special Needs []  - Pediatric / Minor Patient Management 0 []  - Isolation Patient Management 0 []  - Hearing / Language / Visual special needs 0 []  - Assessment of Community assistance (transportation, D/C planning, etc.) 0 []  - Additional assistance / Altered mentation 0 []  - Support Surface(s) Assessment (bed, cushion, seat, etc.) 0 INTERVENTIONS - Wound Cleansing /  Measurement []  - Simple Wound Cleansing - one wound 0 X - Complex Wound Cleansing - multiple wounds 1 5 X - Wound Imaging (photographs - any number of wounds) 1  5 []  - Wound Tracing (instead of photographs) 0 []  - Simple Wound Measurement - one wound 0 X - Complex Wound Measurement - multiple wounds 1 5 INTERVENTIONS - Wound Dressings []  - Small Wound Dressing one or multiple wounds 0 X - Medium Wound Dressing one or multiple wounds 1 15 []  - Large Wound Dressing one or multiple wounds 0 X - Application of Medications - topical 1 5 []  - Application of Medications - injection 0 [name], [name] ([id number]) INTERVENTIONS - Miscellaneous []  - External ear exam 0 []  - Specimen Collection (cultures, biopsies, blood, body fluids, etc.) 0 []  - Specimen(s) / Culture(s) sent or taken to Lab for analysis 0 []  - Patient Transfer (multiple staff / [name]) 0 []  - Simple Staple / Suture removal (25 or less) 0 []  - Complex Staple / Suture removal (26 or more) 0 []  - Hypo / Hyperglycemic Management (close monitor of Blood Glucose) 0 []  - Ankle / Brachial Index (ABI) - do not check if billed separately 0 []  - Vital Signs 0 Has the patient been seen at the hospital within the last three years: Yes Total Score: 60 Level Of Care: New/Established - Level 2 Electronic Signature(s) Signed: 09/16/2015 4:16:33 PM By: Alric Quan Entered By: Alric Quan on 09/16/2015 11:48:07 [name], [name] ([id number]) -------------------------------------------------------------------------------- Encounter Discharge Information Details Patient Name: [name], [name] 09/16/2015 11:00 Date of Service: AM Medical Record [id number] Number: Patient Account Number: [id number] June 02, 1946 (69 y.o. Treating RN: [name] Date of Birth/Sex: Female) Other Clinician: Primary Care Physician: [name] Treating [name] Referring Physician: [place],  [name]: [name]: 62 Encounter Discharge Information Items Discharge Pain Level: 3 Discharge Condition: Stable Ambulatory Status: Ambulatory Discharge Destination: Home Transportation: Private Auto Accompanied By: self Schedule Follow-up Appointment: Yes Medication Reconciliation completed and provided to Patient/Care Yes Michelle Wnek: Provided on Clinical Summary of Care: 09/16/2015 Form Type Recipient Paper Patient LM Electronic Signature(s) Signed: 09/16/2015 11:55:05 AM By: Ruthine Dose Entered By: Ruthine Dose on 09/16/2015 11:55:05 Rought, Shavonne DMarland Kitchen ([id number]) -------------------------------------------------------------------------------- Lower Extremity Assessment Details Patient Name: [name], [name] 09/16/2015 11:00 Date of Service: AM Medical Record [id number] Number: Patient Account Number: [id number] 16-Apr-1946 (68 y.o. Treating RN: [name] Date of Birth/Sex: Female) Other Clinician: Primary Care Physician: [facility], Delana [name] Treating [name] Referring Physician: Glendon [name] Physician/Extender: [name]: 32 Edema Assessment Assessed: [Left: No] [Right: No] E[Left: dema] [Right: :] Calf Left: Right: Point of Measurement: 30 cm From Medial Instep 39.6 cm 36.7 cm Ankle Left: Right: Point of Measurement: 9 cm From Medial Instep 26 cm 27 cm Vascular Assessment Pulses: Posterior Tibial Dorsalis Pedis Palpable: [Left:Yes] [Right:Yes] Extremity colors, hair growth, and conditions: Extremity Color: [Left:Normal] [Right:Normal] Hair Growth on Extremity: [Left:Yes] [Right:Yes] Temperature of Extremity: [Left:Warm] [Right:Warm] Capillary Refill: [Left:< 3 seconds] [Right:< 3 seconds] Electronic Signature(s) Signed: 09/16/2015 5:37:09 PM By: Kristin Cool, RN, [name], [name] Entered By: [name] on 09/16/2015 11:23:50 Kleine, [name]  ([id number]) -------------------------------------------------------------------------------- Multi Wound Chart Details Patient Name: [name], [name] 09/16/2015 11:00 Date of Service: AM Medical Record [id number] Number: Patient Account Number: [id number] 1946-02-04 (69 y.o. Treating RN: [name] Date of Birth/Sex: Female) Other Clinician: Primary Care Physician: [facility], Delana [name] Treating [name] Referring Physician: Glendon [name] Physician/Extender: [name]: 32 Vital Signs Height(in): 65 Pulse(bpm): 75 Weight(lbs): 248 Blood Pressure 139/53 (mmHg): Body Mass Index(BMI): 41 Temperature(F): 97.6 Respiratory Rate 16 (breaths/min): Photos: [2:No Photos] [5:No Photos] [N/A:N/A] Wound Location: [2:Left Lower Leg -  Distal Right Lower Leg -] [5:Posterior] [N/A:N/A] Wounding Event: [2:Blister] [5:Gradually Appeared] [N/A:N/A] Primary Etiology: [2:Venous Leg Ulcer] [5:Venous Leg Ulcer] [N/A:N/A] Comorbid History: [2:Cataracts, Asthma, Coronary Artery Disease, Coronary Artery Disease, Hypertension, Type II Diabetes, Osteoarthritis, Diabetes, Osteoarthritis, Neuropathy] [5:Cataracts, Asthma, Hypertension, Type II Neuropathy] [N/A:N/A] Date Acquired: [2:12/30/2014] [5:04/29/2015] [N/A:N/A] Kristin of [name]: [2:32] [5:17] [N/A:N/A] Wound Status: [2:Open] [5:Open] [N/A:N/A] Clustered Wound: [2:Yes] [5:No] [N/A:N/A] Measurements L x W x D 4.5x5.2x0.1 [5:5x7x0.8] [N/A:N/A] (cm) Area (cm) : [2:18.378] [5:27.489] [N/A:N/A] Volume (cm) : [2:1.838] [5:21.991] [N/A:N/A] % Reduction in Area: [2:-2499.40%] [5:-14521.80%] [N/A:N/A] % Reduction in Volume: -2488.70% [5:-115642.10%] [N/A:N/A] Classification: [2:Full Thickness Without Exposed Support Structures] [5:Full Thickness Without Exposed Support Structures] [N/A:N/A] HBO Classification: [2:Grade 1] [5:Grade 1] [N/A:N/A] Exudate Amount: [2:Medium] [5:Large] [N/A:N/A] Exudate Type: [2:Serosanguineous] [5:Serosanguineous]  [N/A:N/A] Exudate Color: [2:red, brown] [5:red, brown] [N/A:N/A] Wound Margin: [2:Thickened] [5:Thickened] [N/A:N/A] Granulation Amount: Medium (34-66%) Medium (34-66%) N/A Granulation Quality: Pink Pink N/A Necrotic Amount: Medium (34-66%) Medium (34-66%) N/A Exposed Structures: Fascia: No Fascia: No N/A Fat: No Fat: No Tendon: No Tendon: No Muscle: No Muscle: No Joint: No Joint: No Bone: No Bone: No Limited to Skin Limited to Skin Breakdown Breakdown Epithelialization: None None N/A Periwound Skin Texture: Edema: No Edema: No N/A Excoriation: No Excoriation: No Induration: No Induration: No Callus: No Callus: No Crepitus: No Crepitus: No Fluctuance: No Fluctuance: No Friable: No Friable: No Rash: No Rash: No Scarring: No Scarring: No Periwound Skin Moist: Yes Moist: Yes N/A Moisture: Maceration: No Maceration: No Dry/Scaly: No Dry/Scaly: No Periwound Skin Color: Hemosiderin Staining: Yes Hemosiderin Staining: Yes N/A Atrophie Blanche: No Atrophie Blanche: No Cyanosis: No Cyanosis: No Ecchymosis: No Ecchymosis: No Erythema: No Erythema: No Mottled: No Mottled: No Pallor: No Pallor: No Rubor: No Rubor: No Tenderness on No No N/A Palpation: Wound Preparation: Ulcer Cleansing: Ulcer Cleansing: N/A Rinsed/Irrigated with Rinsed/Irrigated with Saline Saline Topical Anesthetic Topical Anesthetic Applied: Other: lidocaine Applied: Other: lidocaine 4% 4% [name] Notes Electronic Signature(s) Signed: 09/16/2015 4:16:33 PM By: Alric Quan Entered By: Alric Quan on 09/16/2015 11:37:00 [name], [name] ([id number]) -------------------------------------------------------------------------------- [place] Details Patient Name: [name], [name] 09/16/2015 11:00 Date of Service: AM Medical Record [id number] Number: Patient Account Number: [id number] [birth year] (68 y.o. Treating RN: [name] Date of Birth/Sex: Female)  Other Clinician: Primary Care Physician: [facility], Delana [name] Treating [name] Referring Physician: Glendon [name] Physician/Extender: [name]: 68 Active Inactive Orientation to the Wound Care Program Nursing Diagnoses: Knowledge deficit related to the wound healing center program Goals: Patient/caregiver will verbalize understanding of the [facility] Program Date Initiated: 01/31/2015 Goal Status: Active Interventions: Provide education on orientation to the wound center Notes: Venous Leg Ulcer Nursing Diagnoses: Potential for venous Insuffiency (use before diagnosis confirmed) Goals: Non-invasive venous studies are completed as ordered Date Initiated: 01/31/2015 Goal Status: Active Patient/caregiver will verbalize understanding of disease process and disease management Date Initiated: 01/31/2015 Goal Status: Active Interventions: Assess peripheral edema status every visit. Notes: Wound/Skin Impairment Nursing Diagnoses: [name], KABALA. ([id number]) Impaired tissue integrity Knowledge deficit related to smoking impact on wound healing Goals: Patient/caregiver will verbalize understanding of skin care regimen Date Initiated: 01/31/2015 Goal Status: Active Ulcer/skin breakdown will heal within 14 Kristin Date Initiated: 01/31/2015 Goal Status: Active Interventions: Assess ulceration(s) every visit Notes: Electronic Signature(s) Signed: 09/16/2015 4:16:33 PM By: Alric Quan Signed: 09/16/2015 5:37:09 PM By: Kristin Cool RN, [name], [name] Entered By: Alric Quan on 09/16/2015 11:36:52 Quiros, Alberto D. ([id number]) -------------------------------------------------------------------------------- Pain Assessment Details Patient Name: Kristin Stare  D. 09/16/2015 11:00 Date of Service: AM Medical Record [id number] Number: Patient Account Number: [id number] Oct 13, 1946 (68 y.o. Treating RN: [name] Date of Birth/Sex: Female) Other Clinician: Primary  Care Physician: [facility], Delana [name] Treating [name] Referring Physician: Glendon [name] Physician/Extender: [name]: 32 Active Problems Location of Pain Severity and Description of Pain Patient Has Paino No Site Locations Pain Management and Medication Current Pain Management: Notes Only hurts when she cleans wounds or bumps it. Electronic Signature(s) Signed: 09/16/2015 5:37:09 PM By: Kristin Cool, RN, [name], [name] Entered By: [name] on 09/16/2015 11:13:01 [place], KAYLAN FAHMY ([id number]) -------------------------------------------------------------------------------- Patient/Caregiver Education Details Patient Name: [name], VALLECILLO 09/16/2015 11:00 Date of Service: AM Medical Record [id number] Number: Patient Account Number: [id number] Jul 30, 1946 (68 y.o. Treating RN: [name] Date of Birth/Gender: Female) Other Clinician: Primary Care Physician: [name], Delana [name] Treating [name] Referring Physician: [place], [place]: [place] in [name]: 32 Education Assessment Education Provided To: Patient Education Topics Provided Wound/Skin Impairment: Handouts: Other: change dressings as prescribed Methods: Demonstration, Explain/Verbal Responses: State content correctly Electronic Signature(s) Signed: 09/16/2015 4:16:33 PM By: Alric Quan Entered By: Alric Quan on 09/16/2015 11:51:48 Christian, Taleeyah D. ([id number]) -------------------------------------------------------------------------------- Wound Assessment Details Patient Name: MAIDIE, GERK D. 09/16/2015 11:00 Date of Service: AM Medical Record [id number] Number: Patient Account Number: [id number] [birth year] (69 y.o. Treating RN: [name] Date of Birth/Sex: Female) Other Clinician: Primary Care Physician: [facility], Delana [name] Treating [name] Referring Physician: Glendon [name] Physician/Extender: [name]: 32 Wound Status Wound Number: 2 Primary  Venous Leg Ulcer Etiology: Wound Location: Left Lower Leg - Distal Wound Open Wounding Event: Blister Status: Date Acquired: 12/30/2014 Comorbid Cataracts, Asthma, Coronary Artery Kristin Of [name]: 32 History: Disease, Hypertension, Type II Clustered Wound: Yes Diabetes, Osteoarthritis, Neuropathy Photos Photo Uploaded By: [name] on 09/16/2015 17:27:59 Wound Measurements Length: (cm) 4.5 Width: (cm) 5.2 Depth: (cm) 0.1 Area: (cm) 18.378 Volume: (cm) 1.838 % Reduction in Area: -2499.4% % Reduction in Volume: -2488.7% Epithelialization: None Tunneling: No Undermining: No Wound Description Full Thickness Without Exposed Foul Odor Classification: Support Structures Vanevery, Herta D. ([id number]) After Cleansing: No Diabetic Severity Grade 1 (Wagner): Wound Margin: Thickened Exudate Amount: Medium Exudate Type: Serosanguineous Exudate Color: red, brown Wound Bed Granulation Amount: Medium (34-66%) Exposed Structure Granulation Quality: Pink Fascia Exposed: No Necrotic Amount: Medium (34-66%) Fat Layer Exposed: No Necrotic Quality: Adherent Slough Tendon Exposed: No Muscle Exposed: No Joint Exposed: No Bone Exposed: No Limited to Skin Breakdown Periwound Skin Texture Texture Color No Abnormalities Noted: No No Abnormalities Noted: No Callus: No Atrophie Blanche: No Crepitus: No Cyanosis: No Excoriation: No Ecchymosis: No Fluctuance: No Erythema: No Friable: No Hemosiderin Staining: Yes Induration: No Mottled: No Localized Edema: No Pallor: No Rash: No Rubor: No Scarring: No Moisture No Abnormalities Noted: No Dry / Scaly: No Maceration: No Moist: Yes Wound Preparation Ulcer Cleansing: Rinsed/Irrigated with Saline Topical Anesthetic Applied: Other: lidocaine 4%, [name] Notes Wound #2 (Left, Distal Lower Leg) 1. Cleansed with: Clean wound with Normal Saline 2. Anesthetic Topical Lidocaine 4% cream to wound bed prior to  debridement 4. Dressing Applied: LINNA, HOLMON ([id number]) Other dressing (specify in notes) 7. Secured with Other (specify in notes) Notes siltec, sorbact corolon 2 layer toes in stockings Electronic Signature(s) Signed: 09/16/2015 5:37:09 PM By: Kristin Cool, RN, [name], [name] Entered By: [name] on 09/16/2015 11:22:42 Hamme, RILEA YEARBY ([id number]) -------------------------------------------------------------------------------- Wound Assessment Details Patient Name: JALEYA, HSU D. 09/16/2015 11:00 Date of Service: AM Medical Record  [id number] Number: Patient Account Number: [id number] [birth year] (68 y.o. Treating RN: [name] Date of Birth/Sex: Female) Other Clinician: Primary Care Physician: [facility], Delana [name] Treating [name] Referring Physician: Glendon [name] Physician/Extender: [name]: 32 Wound Status Wound Number: 5 Primary Venous Leg Ulcer Etiology: Wound Location: Right Lower Leg - Posterior Wound Open Wounding Event: Gradually Appeared Status: Date Acquired: 04/29/2015 Comorbid Cataracts, Asthma, Coronary Artery Kristin Of [name]: 17 History: Disease, Hypertension, Type II Clustered Wound: No Diabetes, Osteoarthritis, Neuropathy Photos Photo Uploaded By: [name] on 09/16/2015 17:28:21 Wound Measurements Length: (cm) 5 Width: (cm) 7 Depth: (cm) 0.8 Area: (cm) 27.489 Volume: (cm) 21.991 % Reduction in Area: -14521.8% % Reduction in Volume: -115642.1% Epithelialization: None Tunneling: No Undermining: No Wound Description Full Thickness Without Exposed Foul Odor Classification: Support Structures Diabetic Severity Grade 1 (Wagner): Wound Margin: Thickened Exudate Amount: Large Exudate Type: Serosanguineous Exudate Color: red, brown Gladue, Amanii D. ([id number]) After Cleansing: No Wound Bed Granulation Amount: Medium (34-66%) Exposed Structure Granulation Quality: Pink Fascia Exposed: No Necrotic  Amount: Medium (34-66%) Fat Layer Exposed: No Necrotic Quality: Adherent Slough Tendon Exposed: No Muscle Exposed: No Joint Exposed: No Bone Exposed: No Limited to Skin Breakdown Periwound Skin Texture Texture Color No Abnormalities Noted: No No Abnormalities Noted: No Callus: No Atrophie Blanche: No Crepitus: No Cyanosis: No Excoriation: No Ecchymosis: No Fluctuance: No Erythema: No Friable: No Hemosiderin Staining: Yes Induration: No Mottled: No Localized Edema: No Pallor: No Rash: No Rubor: No Scarring: No Moisture No Abnormalities Noted: No Dry / Scaly: No Maceration: No Moist: Yes Wound Preparation Ulcer Cleansing: Rinsed/Irrigated with Saline Topical Anesthetic Applied: Other: lidocaine 4%, [name] Notes Wound #5 (Right, Posterior Lower Leg) 1. Cleansed with: Clean wound with Normal Saline 2. Anesthetic Topical Lidocaine 4% cream to wound bed prior to debridement 4. Dressing Applied: Santyl Ointment 5. Secondary Dressing Applied Bordered Foam Dressing 7. Secured with Other (specify in notes) Notes LODA, OLBRICH. ([id number]) corolon 2 layer toes in stockings Electronic Signature(s) Signed: 09/16/2015 5:37:09 PM By: Kristin Cool, RN, [name], [name] Entered By: [name] on 09/16/2015 11:21:40 Lacorte, TEREASA BURSEY ([id number]) -------------------------------------------------------------------------------- Vitals Details Patient Name: KATELAND, MOWERY D. 09/16/2015 11:00 Date of Service: AM Medical Record [id number] Number: Patient Account Number: [id number] [birth year] (69 y.o. Treating RN: [name] Date of Birth/Sex: Female) Other Clinician: Primary Care Physician: [facility], [place] [name] Referring Physician: Glendon [name] Physician/Extender: [name]: 32 Vital Signs Time Taken: 11:13 Temperature (F): 97.6 Height (in): 65 Pulse (bpm): 75 Weight (lbs): 248 Respiratory Rate (breaths/min): 16 Body Mass Index  (BMI): 41.3 Blood Pressure (mmHg): 139/53 Reference Range: 80 - 120 mg / dl Electronic Signature(s) Signed: 09/16/2015 5:37:09 PM By: Kristin Cool, RN, [name], [name] Entered By: [name] on 09/16/2015 11:13:59

## 2015-09-23 ENCOUNTER — Encounter: Payer: PPO | Admitting: Surgery

## 2015-09-23 DIAGNOSIS — E11622 Type 2 diabetes mellitus with other skin ulcer: Secondary | ICD-10-CM | POA: Diagnosis not present

## 2015-09-23 NOTE — Progress Notes (Addendum)
TYREANA, BALDERAZ ([id number]) Visit Report for 09/23/2015 Arrival Information Details Patient Name: NEKESHIA, BRAMMER. Date of Service: 09/23/2015 2:45 PM Medical Record Number: [id number] Patient Account Number: [id number] Date of Birth/Sex: 1946/04/15 (69 y.o. Female) Treating RN: [name], RN, [name], [name] Primary Care Physician: Glendon Axe Other Clinician: Referring Physician: Glendon Axe Treating Physician/Extender: Frann Rider in Treatment: 33 Visit Information History Since Last Visit Any new allergies or adverse reactions: No Patient Arrived: [name] Had a fall or experienced change in No Arrival Time: 14:45 activities of daily living that may affect Accompanied By: self risk of falls: Transfer Assistance: None Signs or symptoms of abuse/neglect since last No Patient Identification Verified: Yes visito Secondary Verification Process Yes Hospitalized since last visit: No Completed: Has Dressing in Place as Prescribed: Yes Patient Has Alerts: Yes Pain Present Now: No Patient Alerts: Patient on Blood Thinner Electronic Signature(s) Signed: 09/23/2015 2:46:00 PM By: Regan Lemming [name], RN Entered By: Regan Lemming on 09/23/2015 14:46:00 Bienvenue, Alieah D. ([id number]) -------------------------------------------------------------------------------- Encounter Discharge Information Details Patient Name: [name] Date of Service: 09/23/2015 2:45 PM Medical Record Number: [id number] Patient Account Number: [id number] Date of Birth/Sex: October 14, 1946 (69 y.o. Female) Treating RN: Baruch Gouty, RN, [name], [name] Primary Care Physician: Glendon Axe Other Clinician: Referring Physician: Glendon Axe Treating Physician/Extender: Frann Rider in Treatment: 33 Encounter Discharge Information Items Schedule Follow-up Appointment: No Medication Reconciliation completed and provided to Patient/Care No Gerrianne Aydelott: Provided on Clinical Summary of Care: 09/23/2015 Form Type  Recipient Paper Patient LM Electronic Signature(s) Signed: 09/23/2015 3:30:44 PM By: Ruthine Dose Entered By: Ruthine Dose on 09/23/2015 15:30:43 Berthold, Aliveah D. ([id number]) -------------------------------------------------------------------------------- Lower Extremity Assessment Details Patient Name: GWENDOLYNNE, DEBONIS D. Date of Service: 09/23/2015 2:45 PM Medical Record Number: [id number] Patient Account Number: [id number] Date of Birth/Sex: 05/21/1946 (69 y.o. Female) Treating RN: [name], RN, [name], [place] Primary Care Physician: Glendon Axe Other Clinician: Referring Physician: Glendon Axe Treating Physician/Extender: Frann Rider in Treatment: 33 Edema Assessment Assessed: [Left: No] [Right: No] E[Left: dema] [Right: :] Calf Left: Right: Point of Measurement: 30 cm From Medial Instep 38.8 cm 36.2 cm Ankle Left: Right: Point of Measurement: 9 cm From Medial Instep 25.8 cm 26.9 cm Vascular Assessment Claudication: Claudication Assessment [Left:None] [Right:None] Pulses: Posterior Tibial Dorsalis Pedis Palpable: [Left:Yes] [Right:Yes] Extremity colors, hair growth, and conditions: Extremity Color: [Left:Mottled] [Right:Mottled] Hair Growth on Extremity: [Left:No] [Right:No] Temperature of Extremity: [Left:Warm] [Right:Warm] Capillary Refill: [Left:< 3 seconds] [Right:< 3 seconds] Toe Nail Assessment Left: Right: Thick: Yes Yes Discolored: No No Deformed: No No Improper Length and Hygiene: No No Electronic Signature(s) Signed: 09/23/2015 2:50:10 PM By: Regan Lemming [name], RN Entered By: Regan Lemming on 09/23/2015 14:50:10 Modica, Amelita D. ([id number]) Koeller, Brynlee D. ([id number]) -------------------------------------------------------------------------------- Multi Wound Chart Details Patient Name: [name] Date of Service: 09/23/2015 2:45 PM Medical Record Number: [id number] Patient Account Number: [id number] Date of Birth/Sex: 1946/07/14 (69 y.o.  Female) Treating RN: Baruch Gouty, RN, [name], [name] Primary Care Physician: Glendon Axe Other Clinician: Referring Physician: Glendon Axe Treating Physician/Extender: Frann Rider in Treatment: 33 Vital Signs Height(in): 65 Pulse(bpm): 88 Weight(lbs): 248 Blood Pressure 166/80 (mmHg): Body Mass Index(BMI): 41 Temperature(F): 97.6 Respiratory Rate 17 (breaths/min): Photos: [2:No Photos] [5:No Photos] [N/A:N/A] Wound Location: [2:Left Lower Leg - Distal Right Lower Leg -] [5:Posterior] [N/A:N/A] Wounding Event: [2:Blister] [5:Gradually Appeared] [N/A:N/A] Primary Etiology: [2:Venous Leg Ulcer] [5:Venous Leg Ulcer] [N/A:N/A] Comorbid History: [2:Cataracts, Asthma, Coronary Artery Disease, Coronary Artery Disease, Hypertension, Type II Diabetes, Osteoarthritis, Diabetes, Osteoarthritis, Neuropathy] [5:Cataracts, Asthma, Hypertension, Type  II Neuropathy] [N/A:N/A] Date Acquired: [2:12/30/2014] [5:04/29/2015] [N/A:N/A] Weeks of Treatment: [2:33] [5:18] [N/A:N/A] Wound Status: [2:Open] [5:Open] [N/A:N/A] Clustered Wound: [2:Yes] [5:No] [N/A:N/A] Measurements L x W x D 4.2x5x0.1 [5:5x7x0.7] [N/A:N/A] (cm) Area (cm) : [2:16.493] [5:27.489] [N/A:N/A] Volume (cm) : [2:1.649] [5:19.242] [N/A:N/A] % Reduction in Area: [2:-2232.80%] [5:-14521.80%] [N/A:N/A] % Reduction in Volume: -2222.50% [5:-101173.70%] [N/A:N/A] Classification: [2:Full Thickness Without Exposed Support Structures] [5:Full Thickness Without Exposed Support Structures] [N/A:N/A] HBO Classification: [2:Grade 1] [5:Grade 1] [N/A:N/A] Exudate Amount: [2:Medium] [5:Large] [N/A:N/A] Exudate Type: [2:Serosanguineous] [5:Serosanguineous] [N/A:N/A] Exudate Color: [2:red, brown] [5:red, brown] [N/A:N/A] Wound Margin: [2:Thickened] [5:Thickened] [N/A:N/A] Granulation Amount: [2:Medium (34-66%)] [5:Medium (34-66%)] [N/A:N/A] Granulation Quality: [2:Pink] [5:Pink] [N/A:N/A] Necrotic Amount: Medium (34-66%) Medium (34-66%)  N/A Exposed Structures: Fascia: No Fascia: No N/A Fat: No Fat: No Tendon: No Tendon: No Muscle: No Muscle: No Joint: No Joint: No Bone: No Bone: No Limited to Skin Limited to Skin Breakdown Breakdown Epithelialization: Small (1-33%) None N/A Periwound Skin Texture: Edema: Yes Edema: No N/A Excoriation: No Excoriation: No Induration: No Induration: No Callus: No Callus: No Crepitus: No Crepitus: No Fluctuance: No Fluctuance: No Friable: No Friable: No Rash: No Rash: No Scarring: No Scarring: No Periwound Skin Moist: Yes Moist: Yes N/A Moisture: Maceration: No Maceration: No Dry/Scaly: No Dry/Scaly: No Periwound Skin Color: Hemosiderin Staining: Yes Hemosiderin Staining: Yes N/A Atrophie Blanche: No Atrophie Blanche: No Cyanosis: No Cyanosis: No Ecchymosis: No Ecchymosis: No Erythema: No Erythema: No Mottled: No Mottled: No Pallor: No Pallor: No Rubor: No Rubor: No Temperature: No Abnormality No Abnormality N/A Tenderness on No Yes N/A Palpation: Wound Preparation: Ulcer Cleansing: Ulcer Cleansing: N/A Rinsed/Irrigated with Rinsed/Irrigated with Saline Saline Topical Anesthetic Topical Anesthetic Applied: Other: lidocaine Applied: Other: lidocaine 4% 4% Treatment Notes Electronic Signature(s) Signed: 09/23/2015 3:26:52 PM By: Regan Lemming [name], RN Entered By: Regan Lemming on 09/23/2015 15:26:52 Iyengar, Ashlei DMarland Kitchen ([id number]) -------------------------------------------------------------------------------- [place] Details Patient Name: TYEASE, SHILLINGFORD D. Date of Service: 09/23/2015 2:45 PM Medical Record Number: [id number] Patient Account Number: [id number] Date of Birth/Sex: 05-22-1946 (69 y.o. Female) Treating RN: [name], RN, [name], [name] Primary Care Physician: Glendon Axe Other Clinician: Referring Physician: Glendon Axe Treating Physician/Extender: Frann Rider in Treatment: 50 Active Inactive Orientation to the  Wound Care Program Nursing Diagnoses: Knowledge deficit related to the wound healing center program Goals: Patient/caregiver will verbalize understanding of the [facility] Program Date Initiated: 01/31/2015 Goal Status: Active Interventions: Provide education on orientation to the wound center Notes: Venous Leg Ulcer Nursing Diagnoses: Potential for venous Insuffiency (use before diagnosis confirmed) Goals: Non-invasive venous studies are completed as ordered Date Initiated: 01/31/2015 Goal Status: Active Patient/caregiver will verbalize understanding of disease process and disease management Date Initiated: 01/31/2015 Goal Status: Active Interventions: Assess peripheral edema status every visit. Notes: Wound/Skin Impairment Nursing Diagnoses: Impaired tissue integrity Knowledge deficit related to smoking impact on wound healing Ursin, Kierston D. ([id number]) Goals: Patient/caregiver will verbalize understanding of skin care regimen Date Initiated: 01/31/2015 Goal Status: Active Ulcer/skin breakdown will heal within 14 weeks Date Initiated: 01/31/2015 Goal Status: Active Interventions: Assess ulceration(s) every visit Notes: Electronic Signature(s) Signed: 09/23/2015 3:26:39 PM By: Regan Lemming [name], RN Entered By: Regan Lemming on 09/23/2015 15:26:39 Benkert, Caileen D. ([id number]) -------------------------------------------------------------------------------- Pain Assessment Details Patient Name: [name] Date of Service: 09/23/2015 2:45 PM Medical Record Number: [id number] Patient Account Number: [id number] Date of Birth/Sex: 05/24/1946 (69 y.o. Female) Treating RN: [name], RN, [name], [employer] Primary Care Physician: Glendon Axe Other Clinician: Referring Physician: Glendon Axe Treating Physician/Extender: Con Memos,  Errol Weeks in Treatment: 33 Active Problems Location of Pain Severity and Description of Pain Patient Has Paino No Site Locations Pain Management  and Medication Current Pain Management: Electronic Signature(s) Signed: 09/23/2015 2:46:06 PM By: Regan Lemming [name], RN Entered By: Regan Lemming on 09/23/2015 14:46:06 Behrens, Tyleigh D. ([id number]) -------------------------------------------------------------------------------- Wound Assessment Details Patient Name: [name] Date of Service: 09/23/2015 2:45 PM Medical Record Number: [id number] Patient Account Number: [id number] Date of Birth/Sex: [birth year] (69 y.o. Female) Treating RN: [name], RN, [name], [place] Primary Care Physician: Glendon Axe Other Clinician: Referring Physician: Glendon Axe Treating Physician/Extender: Frann Rider in Treatment: 33 Wound Status Wound Number: 2 Primary Venous Leg Ulcer Etiology: Wound Location: Left Lower Leg - Distal Wound Open Wounding Event: Blister Status: Date Acquired: 12/30/2014 Comorbid Cataracts, Asthma, Coronary Artery Weeks Of Treatment: 33 History: Disease, Hypertension, Type II Clustered Wound: Yes Diabetes, Osteoarthritis, Neuropathy Photos Wound Measurements Length: (cm) 4.2 Width: (cm) 5 Depth: (cm) 0.1 Area: (cm) 16.493 Volume: (cm) 1.649 % Reduction in Area: -2232.8% % Reduction in Volume: -2222.5% Epithelialization: Small (1-33%) Tunneling: No Undermining: No Wound Description Full Thickness Without Exposed Classification: Support Structures Diabetic Severity Grade 1 (Wagner): Wound Margin: Thickened Exudate Amount: Medium Exudate Type: Serosanguineous Exudate Color: red, brown Foul Odor After Cleansing: No Wound Bed Granulation Amount: Medium (34-66%) Exposed Structure Granulation Quality: Pink Fascia Exposed: No Jourdan, Sonika D. ([id number]) Necrotic Amount: Medium (34-66%) Fat Layer Exposed: No Necrotic Quality: Adherent Slough Tendon Exposed: No Muscle Exposed: No Joint Exposed: No Bone Exposed: No Limited to Skin Breakdown Periwound Skin Texture Texture Color No Abnormalities  Noted: No No Abnormalities Noted: No Callus: No Atrophie Blanche: No Crepitus: No Cyanosis: No Excoriation: No Ecchymosis: No Fluctuance: No Erythema: No Friable: No Hemosiderin Staining: Yes Induration: No Mottled: No Localized Edema: Yes Pallor: No Rash: No Rubor: No Scarring: No Temperature / Pain Moisture Temperature: No Abnormality No Abnormalities Noted: No Dry / Scaly: No Maceration: No Moist: Yes Wound Preparation Ulcer Cleansing: Rinsed/Irrigated with Saline Topical Anesthetic Applied: Other: lidocaine 4%, Electronic Signature(s) Signed: 09/24/2015 5:43:28 PM By: Regan Lemming [name], RN Previous Signature: 09/23/2015 3:00:33 PM Version By: Regan Lemming [name], RN Entered By: Regan Lemming on 09/24/2015 17:43:28 Sonnen, Salina D. ([id number]) -------------------------------------------------------------------------------- Wound Assessment Details Patient Name: ORAL, ROBLING D. Date of Service: 09/23/2015 2:45 PM Medical Record Number: [id number] Patient Account Number: [id number] Date of Birth/Sex: [birth year] (69 y.o. Female) Treating RN: [name], RN, [name], [place] Primary Care Physician: Glendon Axe Other Clinician: Referring Physician: Glendon Axe Treating Physician/Extender: Frann Rider in Treatment: 33 Wound Status Wound Number: 5 Primary Venous Leg Ulcer Etiology: Wound Location: Right Lower Leg - Posterior Wound Open Wounding Event: Gradually Appeared Status: Date Acquired: 04/29/2015 Comorbid Cataracts, Asthma, Coronary Artery Weeks Of Treatment: 18 History: Disease, Hypertension, Type II Clustered Wound: No Diabetes, Osteoarthritis, Neuropathy Photos Wound Measurements Length: (cm) 5 Width: (cm) 7 Depth: (cm) 0.7 Area: (cm) 27.489 Volume: (cm) 19.242 % Reduction in Area: -14521.8% % Reduction in Volume: -101173.7% Epithelialization: None Tunneling: No Undermining: No Wound Description Full Thickness Without Exposed Foul Odor  A Classification: Support Structures Diabetic Severity Grade 1 (Wagner): Wound Margin: Thickened Exudate Amount: Large Exudate Type: Serosanguineous Exudate Color: red, brown fter Cleansing: No Wound Bed Granulation Amount: Medium (34-66%) Exposed Structure Granulation Quality: Pink Fascia Exposed: No Oberry, Ayahna D. ([id number]) Necrotic Amount: Medium (34-66%) Fat Layer Exposed: No Necrotic Quality: Adherent Slough Tendon Exposed: No Muscle Exposed: No Joint Exposed: No Bone Exposed: No Limited to Skin Breakdown Periwound Skin Texture  Texture Color No Abnormalities Noted: No No Abnormalities Noted: No Callus: No Atrophie Blanche: No Crepitus: No Cyanosis: No Excoriation: No Ecchymosis: No Fluctuance: No Erythema: No Friable: No Hemosiderin Staining: Yes Induration: No Mottled: No Localized Edema: No Pallor: No Rash: No Rubor: No Scarring: No Temperature / Pain Moisture Temperature: No Abnormality No Abnormalities Noted: No Tenderness on Palpation: Yes Dry / Scaly: No Maceration: No Moist: Yes Wound Preparation Ulcer Cleansing: Rinsed/Irrigated with Saline Topical Anesthetic Applied: Other: lidocaine 4%, Electronic Signature(s) Signed: 09/24/2015 5:43:55 PM By: Regan Lemming [name], RN Previous Signature: 09/23/2015 3:00:57 PM Version By: Regan Lemming [name], RN Entered By: Regan Lemming on 09/24/2015 17:43:55 Sozio, Chisa D. ([id number]) -------------------------------------------------------------------------------- Vitals Details Patient Name: [name] Date of Service: 09/23/2015 2:45 PM Medical Record Number: [id number] Patient Account Number: [id number] Date of Birth/Sex: May 05, 1946 (69 y.o. Female) Treating RN: [name], RN, [name], [place] Primary Care Physician: Glendon Axe Other Clinician: Referring Physician: Glendon Axe Treating Physician/Extender: Frann Rider in Treatment: 33 Vital Signs Time Taken: 14:48 Temperature (F):  97.6 Height (in): 65 Pulse (bpm): 88 Weight (lbs): 248 Respiratory Rate (breaths/min): 17 Body Mass Index (BMI): 41.3 Blood Pressure (mmHg): 166/80 Reference Range: 80 - 120 mg / dl Electronic Signature(s) Signed: 09/23/2015 2:48:47 PM By: Regan Lemming [name], RN Entered By: Regan Lemming on 09/23/2015 14:48:47

## 2015-09-24 NOTE — Progress Notes (Addendum)
SEHEJ, SYPNIEWSKI ([id number]) Visit Report for 09/23/2015 Chief Complaint Document Details Patient Name: [name], [name] 09/23/2015 2:45 Date of Service: PM Medical Record [id number] Number: Patient Account Number: [id number] June 25, 1946 (69 y.o. [name], RN, [name], Date of Birth/Sex: Treating RN: Female) Administrator, sports Primary Care Physician: Glendon Axe Other Clinician: Referring Physician: Glendon Axe Treating Carlas Vandyne Physician/Extender: Weeks in Treatment: 33 Information Obtained from: Patient Chief Complaint Patient presents to the wound care center for a consult due non healing wound 69 year old patient comes with a history of having a ulcer on the left lower extremity for the past 4 weeks. she says she's had swelling of both lower extremities for about a year after she started having prednisone. 02/07/2015 -- her vascular appointments obtained were in the first and third week of June. she is able to go to [place] and we will try and get her some earlier appointments. Other than that nothing else has changed in her management. Electronic Signature(s) Signed: 09/23/2015 3:40:06 PM By: [name], [name] Entered By: [name] on 09/23/2015 15:40:06 Druck, SARAANN FLAGLER ([id number]) -------------------------------------------------------------------------------- Cellular or Tissue Based Product Details Patient Name: [name], [name] 09/23/2015 2:45 Date of Service: PM Medical Record [id number] Number: Patient Account Number: [id number] November 08, 1945 (69 y.o. [name], RN, [name], Date of Birth/Sex: Treating RN: Female) Administrator, sports Primary Care Physician: Glendon Axe Other Clinician: Referring Physician: Glendon Axe Treating Peightyn Roberson Physician/Extender: Weeks in Treatment: 33 Cellular or Tissue Based Wound #2 Left,Distal Lower Leg Product Type Applied to: Performed By: Physician [name], MD Cellular or Tissue Based Apligraf Product Type: Time-Out Taken:  Yes Location: trunk / arms / legs Wound Size (sq cm): 21 Product Size (sq cm): 44 Waste Size (sq cm): 23 Waste Reason: extra Amount of Product Applied (sq cm): 21 Lot #: [id number] Expiration Date: 10/01/2015 Fenestrated: Yes Instrument: Blade Reconstituted: Yes Solution Type: saline Solution Amount: 62ml Lot #: [id number] Solution Expiration 04/24/2017 Date: Secured: Yes Secured With: Steri-Strips Dressing Applied: Yes Primary Dressing: mepitel one Procedural Pain: 0 Post Procedural Pain: 0 Response to Treatment: Procedure was tolerated well Post Procedure Diagnosis Same as Pre-procedure Electronic Signature(s) Signed: 09/23/2015 3:39:59 PM By: [name], [name] Previous Signature: 09/23/2015 3:35:56 PM Version By: Regan Lemming [name], RN Fritts, Thula D. ([id number]) Entered By: [name] on 09/23/2015 15:39:59 Mcdaniel, Kiylah D. ([id number]) -------------------------------------------------------------------------------- HPI Details Patient Name: [name], [name] 09/23/2015 2:45 Date of Service: PM Medical Record [id number] Number: Patient Account Number: [id number] January 05, 1946 (69 y.o. [name], RN, [name], Date of Birth/Sex: Treating RN: Female) Administrator, sports Primary Care Physician: Glendon Axe Other Clinician: Referring Physician: Glendon Axe Treating Marguarite Markov Physician/Extender: Weeks in Treatment: 4 History of Present Illness HPI Description: 69 year old patient who is known to have diabetes mellitus type 2, chronic renal insufficiency, coronary artery disease, hypertension, hypercholesterolemia, temporal arteritis and inflammatory arthritiss also has a history of having a hysterectomy and some orthopedic related surgeries. The ulcer on the left lower extremity started off as a blister and then. Got progressively worse. She does not have any fever or chills and has not had any recent surgical intervention for this. Her last hemoglobin A1c was 10.1 in September  2015. She has been recently put on doxycycline by her PCP. She is now also allergic to doxycycline and was this was changed over to Keflex. 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 She does see a cardiologist and also  takes a diuretic. 02/07/2015 her arterial and venous duplex studies to be done have dates been given as the first and third week of June. This is at [facility]. We are going to try and get early appointments at [facility]. other than that nothing has changed in her management. 02/14/2015 -- we have been able to get her an appointment in [facility] on May 20 which is much earlier than her previous ones at [place]. She continues with her prednisone and her sugars are in the range of 150-200. 02/21/2015 We were able to get a vascular lab workup for her today and she is going to be there at 2:00 this afternoon. the swelling of her leg has gone down significantly but she still has some tenderness over the wounds. 02/28/2015 - She has had one of two vascular workups done, and this coming Tuesday has another, at [facility]. She continues to be on steroid medications. She has significant sensitivity in her left lower extremity and has pain suggestive of neuropathic pain and I have asked her to address this with her primary care physician. 03/07/2015 -- The patient saw Dr. Lucky Cowboy for a consultation and he has had her arteries are okay but she has 2 incompetent veins on the left lower extremity and he is going to set her up for surgery. Official report is awaited. Addendum: Official reports are now available and on 03/04/2015. She was seen and lower extremity venous duplex exam was done. There was reflux present within the left greater saphenous vein below the knee and also the left small saphenous vein. Arterial duplex showed normal triphasic waveforms  throughout the left lower extremity without any significant stenosis. Her ABIs were noncompressible bilaterally but a waveforms were normal and a digital pressures were normal bilaterally consistent with no significant arterial insufficiency. He has recommended endovenous ablation of both the left small saphenous and the left great saphenous vein. This would still be scheduled later. 03/14/2015 -- she has heard back from the vascular office and has surgery scheduled for sometime in July. [name], [name] ([id number]) Her rheumatologist has decreased her prednisone dosage but she still on it. She has also had cataract surgery in her right eye recently this week. 03/20/2015 - No new complaints today. Pain improved. No fever or chills. Tolerating 2 layer compression. 04/14/2015 -- she was doing very well today she went off on vacation and now her edema has increased markedly the ulceration is bigger and her diabetes is not under control. 04/21/2015 -- I spoke to her PCP Dr. Candiss Norse and discussed the management which would include being seen by a general surgeon for debridement and taking multiple punch biopsies which would help in establishing the diagnosis of this is a vasculitis. She is agreeable about this and will set her up for the procedure with Dr. Tamala Julian at [facility]. She was seen by the surgeon Dr. Jamal Collin. 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 She is scheduled to have laser ablation done next week.Pt was sent here for possible biopsy to look for vasculitis. Feel it would be better to wait after laser ablation is completed- the ulcer may heal fully and biopsy may not be necessary 04/29/2015 -- she had the venous ablation done by Dr. Lucky Cowboy last Friday and we do not have any notes yet. She is doing fine otherwise. 05/06/2015 --Review of her recent vascular intervention  shows that she  was seen by Dr. Lucky Cowboy on 04/29/2015. The follow-up duplex which was done showed that both the great saphenous vein and the small saphenous vein remained patent with reflux consistent with an unsuccessful ablation. He has rescheduled her for another the endovenous ablation to be done in about 4 weekso time. 05/13/2015 -- he was seen by her surgeon Dr. Jamal Collin who asked her to continue with conservative therapy and he would speak to Dr. Lucky Cowboy about her management. Dr. Lucky Cowboy is going to schedule her surgery in the middle of August for a repeat endovenous ablation. Her pus culture from last week has grown : [place] her noted her sensitivity report but due to her multiple allergies I had tried clindamycin and she developed a rash with this too. She has been prescribed and anti-buttocks in the ER and is has it at home and she will let is know what she is going to be taking. 05/20/2015 -- she has developed a small spot on her right lower extremity but besides that it is not a full fledged ulceration. She did not get to see Dr. Lucky Cowboy last week and hopefully she will see him in the near future. 05/27/2015 -she is still awaiting her appointment with Dr.Dew and her vascular procedure is not scheduled until August 19. She will be seeing her PCP tomorrow and I have asked her to convey our discussion so that she is aware that debridement has not been done yet. 06/03/2015 -- was seen by her rheumatologist Dr. 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 He is lowering her prednisone to 12-1/2 mg for 1 month and then 10 mg per the next month. I will again make an attempt to speak to her PCP Dr. Candiss Norse and her surgeon Dr. Lucky Cowboy to see if he can organize for a debridement in the OR with multiple  biopsies to establish a diagnosis of vasculitis or pyoderma gangrenosum. 06/17/2015 -- Dr.Dew did her vascular procedure last week and a follow-up venous ultrasound shows good resolution of the veins as per the patient's history. He is to see her back in 2 weeks. [name], [name] ([id number]) 07/07/2015. -- the patient has had a heavy growth of Proteus mirabilis and Enterococcus faecalis. These are sensitive to several drugs but the problem is she has allergies to all of these and hence I would like her to see Dr. Ola Spurr for this. She is also due to see Dr. Lucky Cowboy tomorrow and I will discussed the management with him including debridement under anesthesia and possible biopsies. 07/14/2015 -- she has an appointment to see Dr. Ola Spurr tomorrow and did see Dr. Bunnie Domino PA who will discuss my request with him. 07/21/2015 -- saw Dr. Ola Spurr was able to do a test on her and has put her on amoxicillin. She has been tolerating that and has had no problems with allergies to this. 07/28/2015 -- Last Friday I spoke to Dr. Leotis Pain regarding her care and he said that her right leg did not need any surgery and on the left leg was doing pretty good. We did agree that if she undergoes any procedure in the future he would do a couple of punch biopsies of the wound. 08/18/2015 -- her right leg is very tender and there is significant amount of slough. The left leg is looking much better 09/02/2015 -- she is going to  have a debridement and punch biopsies of her right lower extremity by Dr. Leotis Pain this coming Thursday. Also seen Dr. Ola Spurr who has continued her on ciprofloxacin. 09/09/2015 -- on 09/04/2015 Dr. Leotis Pain took her to surgery - Irrigation and excisional debridement of skin, soft tissue, and muscle to about 40 cm2 to the right posterior calf with biopsy. Pathology results are -- DIAGNOSIS: SKIN, RIGHT LOWER EXTREMITY; BIOPSY: SKIN AND SOFT TISSUE WITH ULCERATION, SEE NOTE. - NEGATIVE FOR  DYSPLASIA AND MALIGNANCY. Note: There is acute inflammation and ulceration of the epidermis. The dermis shows nonspecific inflammation, neovascularization, and hemosiderin deposition. The differential diagnosis for these findings includes stasis dermatitis, nonspecific dermatitis, and infection. Correlation with clinical findings is required. A PAS fungal stain is obtained and results will be reported in an addendum. cultures were also sent and this grew Pseudomonas aeruginosa, Escherichia coli, Proteus mirabilis, Klebsiella pneumoniae and they were all sensitive to ciprofloxacin which she is on. 09/16/2015 -- he saw Dr. 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 He is going to call the patient in and discuss with her the management possibly with Imuran. I will continue treating her locally. 09/23/2015 -- she saw Dr. Ola Spurr today who was going to continue the antibiotics for now and stop after this course. She has an appointment to see Dr. Jefm Bryant in about a week's time. Her wound VAC has arrived but she did not bring it with her today. We will try and set her up for changes to the right leg 3 times a week. She is here for her first application of Apligraf to the left lower extremity. Electronic Signature(s) Signed: 09/23/2015 3:41:43 PM By: [name], [name] Entered By: [name] on 09/23/2015 15:41:43 [name], [name] ([id number]) -------------------------------------------------------------------------------- Physical Exam Details Patient Name: MILAINA, DOPSON D. 09/23/2015 2:45 Date of Service: PM Medical Record [id number] Number: Patient Account Number: [id number] 09-27-1946 (69 y.o. [name], RN, [name], Date of Birth/Sex: Treating RN: Female) [place] Primary Care Physician: Glendon Axe Other Clinician: Referring Physician: Candiss Norse, [name]  Treating Talonda Artist Physician/Extender: Weeks in Treatment: 33 Constitutional . Pulse regular. Respirations normal and unlabored. Afebrile. . Eyes Nonicteric. Reactive to light. Ears, Nose, Mouth, and Throat Lips, teeth, and gums WNL.Marland Kitchen Moist mucosa without lesions . Neck supple and nontender. No palpable supraclavicular or cervical adenopathy. Normal sized without goiter. Respiratory WNL. No retractions.. Cardiovascular Pedal Pulses WNL. No clubbing, cyanosis or edema. Lymphatic No adneopathy. No adenopathy. No adenopathy. Musculoskeletal Adexa without tenderness or enlargement.. Digits and nails w/o clubbing, cyanosis, infection, petechiae, ischemia, or inflammatory conditions.. Integumentary (Hair, Skin) No suspicious lesions. No crepitus or fluctuance. No peri-wound warmth or erythema. No masses.Marland Kitchen Psychiatric Judgement and insight Intact.. No evidence of depression, anxiety, or agitation.. Notes the left lower extremity was washed out nicely with saline and she is ready for the first application of Apligraf. On her right lower extremity which is still very tender she has minimal slough which I have tried to gently debrided with moist saline gauze. Electronic Signature(s) Signed: 09/23/2015 3:42:29 PM By: [name], [name] Entered By: [name] on 09/23/2015 15:42:29 [name], [name] ([id number]) -------------------------------------------------------------------------------- Physician Orders Details Patient Name: [name], [name] 09/23/2015 2:45 Date of Service: PM Medical Record [id number] Number: Patient Account Number: [id number] 08-26-1946 (69 y.o. [name], RN, [name], Date of Birth/Sex: Treating RN: Female) [place] Primary Care Physician: Glendon Axe Other Clinician: Referring Physician:  [place], [name] Treating Marguerite Jarboe Physician/Extender: Weeks in Treatment: 26 Verbal / Phone Orders: Yes Clinician: [name], RN, [name], Rita Read Back and Verified:  Yes Diagnosis Coding Wound Cleansing Wound #2 Left,Distal Lower Leg o Clean wound with Normal Saline. Wound #5 Right,Posterior Lower Leg o Clean wound with Normal Saline. Anesthetic Wound #2 Left,Distal Lower Leg o Topical Lidocaine 4% cream applied to wound bed prior to debridement Wound #5 Right,Posterior Lower Leg o Topical Lidocaine 4% cream applied to wound bed prior to debridement Skin Barriers/Peri-Wound Care Wound #2 Left,Distal Lower Leg o Skin Prep Wound #5 Right,Posterior Lower Leg o Skin Prep Primary Wound Dressing Wound #2 Left,Distal Lower Leg o Other: - Apligraf applied Wound #5 Right,Posterior Lower Leg o Santyl Ointment - NPWT to be applied by Home Health nurse Secondary Dressing Wound #2 Left,Distal Lower Leg o ABD pad o Dry Gauze Wound #5 Right,Posterior Lower Leg Soja, Meiah D. ([id number]) o Gauze and Kerlix/Conform Dressing Change Frequency Wound #2 Left,Distal Lower Leg o Change dressing every week - At [facility] Wound #5 Right,Posterior Lower Leg o Change dressing every day. - until NPWT applied Follow-up Appointments Wound #2 Left,Distal Lower Leg o Return Appointment in 1 week. Wound #5 Right,Posterior Lower Leg o Return Appointment in 1 week. Edema Control Wound #2 Left,Distal Lower Leg o 2 Layer Compression System - Left Lower Extremity o Elevate legs to the level of the heart and pump ankles as often as possible Wound #5 Right,Posterior Lower Leg o Patient to wear own compression stockings o Elevate legs to the level of the heart and pump ankles as often as possible Negative Pressure Wound Therapy Wound #5 Right,Posterior Lower Leg o Wound VAC settings at 125/130 mmHg continuous pressure. Use BLACK/GREEN foam to wound cavity. Use WHITE foam to fill any tunnel/s and/or undermining. Change VAC dressing 3 X WEEK. Change canister as indicated when full. Nurse may titrate settings and frequency of dressing  changes as clinically indicated. o Home Health Nurse may d/c VAC for s/s of increased infection, significant wound regression, or uncontrolled drainage. Clara at [phone]. o Number of foam/gauze pieces used in the dressing = Electronic Signature(s) Signed: 09/23/2015 3:32:40 PM By: Regan Lemming [name], RN Signed: 09/23/2015 5:47:05 PM By: [name], [name] Previous Signature: 09/23/2015 3:32:02 PM Version By: Regan Lemming [name], RN Entered By: Regan Lemming on 09/23/2015 15:32:40 Yonts, MAKYAH SHELMAN ([id number]) -------------------------------------------------------------------------------- Problem List Details Patient Name: [name], [name] 09/23/2015 2:45 Date of Service: PM Medical Record [id number] Number: Patient Account Number: [id number] May 17, 1946 (69 y.o. 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 ([id number]) Heiney, Jonica D.  ([id number]) -------------------------------------------------------------------------------- Progress Note Details Patient Name: [name] Stare D. 09/23/2015 2:45 Date of Service:  PM Medical Record [id number] Number: Patient Account Number: [id number] 1945/12/06 (68 y.o. [name], RN, [name], Date of Birth/Sex: Treating RN: Female) Administrator, sports Primary Care Physician: Glendon Axe Other Clinician: Referring Physician: Glendon Axe Treating Lurena Naeve Physician/Extender: Weeks in Treatment: 33 Subjective Chief Complaint Information obtained from Patient Patient presents to the wound care center for a consult due non healing wound 69 year old patient comes with a history of having a ulcer on the left lower extremity for the past 4 weeks. she says she's had swelling of both lower extremities for about a year after she started having prednisone. 02/07/2015 -- her vascular appointments obtained were in the first and third week of June. she is able to go to [place] and we will try and get her some earlier appointments. Other than that nothing else has changed in her management. History of Present Illness (HPI) 69 year old patient who is known to have diabetes mellitus type 2, chronic renal insufficiency, coronary artery disease, hypertension, hypercholesterolemia, temporal arteritis and inflammatory arthritiss also has a history of having a hysterectomy and some orthopedic related surgeries. The ulcer on the left lower extremity started off as a blister and then. Got progressively worse. She does not have any fever or chills and has not had any recent surgical intervention for this. Her last hemoglobin A1c was 10.1 in September 2015. She has been recently put on doxycycline by her PCP. She is now also allergic to doxycycline and was this was changed over to Keflex. due to her temporal arteritis she has been on prednisone for about a year and she says ever since that she has had swelling of  both lower extremities. She does see a cardiologist and also takes a diuretic. 02/07/2015 her arterial and venous duplex studies to be done have dates been given as the first and third week of June. This is at [facility]. We are going to try and get early appointments at [facility]. other than that nothing has changed in her management. 02/14/2015 -- we have been able to get her an appointment in [facility] on May 20 which is much earlier than her previous ones at [place]. She continues with her prednisone and her sugars are in the range of 150-200. 02/21/2015 We were able to get a vascular lab workup for her today and she is going to be there at 2:00 this afternoon. the swelling of her leg has gone down significantly but she still has some tenderness over the wounds. 02/28/2015 - She has had one of two vascular workups done, and this coming Tuesday has another, at [place]. She continues to be on steroid medications. She has significant sensitivity in her left lower extremity and has pain suggestive of neuropathic pain and I have asked her to address this with her primary care physician. 03/07/2015 -- The patient saw Dr. Lucky Cowboy for a consultation and he has had her arteries are okay but she has 2 incompetent veins on the left lower extremity and he is going to set her up for surgery. Official report is LAKAYSHA, VICENS ([id number]) awaited. Addendum: Official reports are now available and on 03/04/2015. She was seen and lower extremity venous duplex exam was done. There was reflux present within the left greater saphenous vein below the knee and also the left small saphenous vein. Arterial duplex showed normal triphasic waveforms throughout the left lower extremity without any significant stenosis. Her ABIs were noncompressible bilaterally but a waveforms were normal and a digital pressures were normal bilaterally consistent  with no significant  arterial insufficiency. He has recommended endovenous ablation of both the left small saphenous and the left great saphenous vein. This would still be scheduled later. 03/14/2015 -- she has heard back from the vascular office and has surgery scheduled for sometime in July. Her rheumatologist has decreased her prednisone dosage but she still on it. She has also had cataract surgery in her right eye recently this week. 03/20/2015 - No new complaints today. Pain improved. No fever or chills. Tolerating 2 layer compression. 04/14/2015 -- she was doing very well today she went off on vacation and now her edema has increased markedly the ulceration is bigger and her diabetes is not under control. 04/21/2015 -- I spoke to her PCP Dr. Candiss Norse and discussed the management which would include being seen by a general surgeon for debridement and taking multiple punch biopsies which would help in establishing the diagnosis of this is a vasculitis. She is agreeable about this and will set her up for the procedure with Dr. Tamala Julian at [facility] She was seen by the surgeon Dr. Jamal Collin. 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 She is scheduled to have laser ablation done next week.Pt was sent here for possible biopsy to look for vasculitis. Feel it would be better to wait after laser ablation is completed- the ulcer may heal fully and biopsy may not be necessary 04/29/2015 -- she had the venous ablation done by Dr. Lucky Cowboy last Friday and we do not have any notes yet. She is doing fine otherwise. 05/06/2015 --Review of her recent vascular intervention shows that she was seen by Dr. Lucky Cowboy on 04/29/2015. The follow-up duplex which was done showed that both the great saphenous vein and the small saphenous vein remained patent with reflux consistent with an unsuccessful ablation. He has rescheduled her  for another the endovenous ablation to be done in about 4 weeks time. 05/13/2015 -- he was seen by her surgeon Dr. Jamal Collin who asked her to continue with conservative therapy and he would speak to Dr. Lucky Cowboy about her management. Dr. Lucky Cowboy is going to schedule her surgery in the middle of August for a repeat endovenous ablation. Her pus culture from last week has grown : [place] her noted her sensitivity report but due to her multiple allergies I had tried clindamycin and she developed a rash with this too. She has been prescribed and anti-buttocks in the ER and is has it at home and she will let is know what she is going to be taking. 05/20/2015 -- she has developed a small spot on her right lower extremity but besides that it is not a full fledged ulceration. She did not get to see Dr. Lucky Cowboy last week and hopefully she will see him in the near future. 05/27/2015 -she is still awaiting her appointment with Dr.Dew and her vascular procedure is not scheduled until August 19. She will be seeing her PCP tomorrow and I have asked her to convey our discussion so that she is aware that debridement has not been done yet. [name], [name] ([id number]) 06/03/2015 -- was seen by her rheumatologist Dr. 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 He is lowering her prednisone to 12-1/2 mg for 1 month and then 10 mg per the  next month. I will again make an attempt to speak to her PCP Dr. Candiss Norse and her surgeon Dr. 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 06/17/2015 -- Dr.Dew did her vascular procedure last week and a follow-up venous ultrasound shows good resolution of the veins as per the patient's  history. He is to see her back in 2 weeks. 07/07/2015. -- the patient has had a heavy growth of Proteus mirabilis and Enterococcus faecalis. These are sensitive to several drugs but the problem is she has allergies to all of these and hence I would like her to see Dr. Ola Spurr for this. She is also due to see Dr. Lucky Cowboy tomorrow and I will discussed the management with him including debridement under anesthesia and possible biopsies. 07/14/2015 -- she has an appointment to see Dr. Ola Spurr tomorrow and did see Dr. Bunnie Domino PA who will discuss my request with him. 07/21/2015 -- saw Dr. Ola Spurr was able to do a test on her and has put her on amoxicillin. She has been tolerating that and has had no problems with allergies to this. 07/28/2015 -- Last Friday I spoke to Dr. Leotis Pain regarding her care and he said that her right leg did not need any surgery and on the left leg was doing pretty good. We did agree that if she undergoes any procedure in the future he would do a couple of punch biopsies of the wound. 08/18/2015 -- her right leg is very tender and there is significant amount of slough. The left leg is looking much better 09/02/2015 -- she is going to have a debridement and punch biopsies of her right lower extremity by Dr. Leotis Pain this coming Thursday. Also seen Dr. Ola Spurr who has continued her on ciprofloxacin. 09/09/2015 -- on 09/04/2015 Dr. Leotis Pain took her to surgery - Irrigation and excisional debridement of skin, soft tissue, and muscle to about 40 cm2 to the right posterior calf with biopsy. Pathology results are -- DIAGNOSIS: SKIN, RIGHT LOWER EXTREMITY; BIOPSY: SKIN AND SOFT TISSUE WITH ULCERATION, SEE NOTE. - NEGATIVE FOR DYSPLASIA AND MALIGNANCY. Note: There is acute inflammation and ulceration of the epidermis. The dermis shows nonspecific inflammation, neovascularization, and hemosiderin deposition. The differential diagnosis for these findings includes stasis  dermatitis, nonspecific dermatitis, and infection. Correlation with clinical findings is required. A PAS fungal stain is obtained and results will be reported in an addendum. cultures were also sent and this grew Pseudomonas aeruginosa, Escherichia coli, Proteus mirabilis, Klebsiella pneumoniae and they were all sensitive to ciprofloxacin which she is on. 09/16/2015 -- he saw Dr. 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 He is going to call the patient in and discuss with her the management possibly with Imuran. I will continue treating her locally. 09/23/2015 -- she saw Dr. Ola Spurr today who was going to continue the antibiotics for now and stop after this course. She has an appointment to see Dr. Jefm Bryant in about a week's time. Her wound VAC has arrived but she did not bring it with her today. We will try and set her up for changes to the right leg 3 times a week. She is here for her first application of Apligraf to the left lower extremity. [name], [name] ([id number]) Objective Constitutional Pulse regular. Respirations normal and unlabored. Afebrile.  Vitals Time Taken: 2:48 PM, Height: 65 in, Weight: 248 lbs, BMI: 41.3, Temperature: 97.6 F, Pulse: 88 bpm, Respiratory Rate: 17 breaths/min, Blood Pressure: 166/80 mmHg. Eyes Nonicteric. Reactive to light. Ears, Nose, Mouth, and Throat Lips, teeth, and gums WNL.Marland Kitchen Moist mucosa without lesions . Neck supple and nontender. No palpable supraclavicular or cervical adenopathy. Normal sized without goiter. Respiratory WNL. No retractions.. Cardiovascular Pedal Pulses WNL. No clubbing, cyanosis or edema. Lymphatic No adneopathy. No adenopathy. No adenopathy. Musculoskeletal Adexa without tenderness or enlargement.. Digits and nails w/o clubbing, cyanosis, infection, petechiae, ischemia, or inflammatory  conditions.Marland Kitchen Psychiatric Judgement and insight Intact.. No evidence of depression, anxiety, or agitation.. General Notes: the left lower extremity was washed out nicely with saline and she is ready for the first application of Apligraf. On her right lower extremity which is still very tender she has minimal slough which I have tried to gently debrided with moist saline gauze. Integumentary (Hair, Skin) No suspicious lesions. No crepitus or fluctuance. No peri-wound warmth or erythema. No masses.. Wound #2 status is Open. Original cause of wound was Blister. The wound is located on the Left,Distal Lower Leg. The wound measures 4.2cm length x 5cm width x 0.1cm depth; 16.493cm^2 area and Tokunaga, Nadege D. ([id number]) 1.649cm^3 volume. The wound is limited to skin breakdown. There is no tunneling or undermining noted. There is a medium amount of serosanguineous drainage noted. The wound margin is thickened. There is medium (34-66%) pink granulation within the wound bed. There is a medium (34-66%) amount of necrotic tissue within the wound bed including Adherent Slough. The periwound skin appearance exhibited: Localized Edema, Moist, Hemosiderin Staining. The periwound skin appearance did not exhibit: Callus, Crepitus, Excoriation, Fluctuance, Friable, Induration, Rash, Scarring, Dry/Scaly, Maceration, Atrophie Blanche, Cyanosis, Ecchymosis, Mottled, Pallor, Rubor, Erythema. Periwound temperature was noted as No Abnormality. Wound #5 status is Open. Original cause of wound was Gradually Appeared. The wound is located on the Right,Posterior Lower Leg. The wound measures 5cm length x 7cm width x 0.7cm depth; 27.489cm^2 area and 19.242cm^3 volume. The wound is limited to skin breakdown. There is no tunneling or undermining noted. There is a large amount of serosanguineous drainage noted. The wound margin is thickened. There is medium (34-66%) pink granulation within the wound bed. There is a medium  (34-66%) amount of necrotic tissue within the wound bed including Adherent Slough. The periwound skin appearance exhibited: Moist, Hemosiderin Staining. The periwound skin appearance did not exhibit: Callus, Crepitus, Excoriation, Fluctuance, Friable, Induration, Localized Edema, Rash, Scarring, Dry/Scaly, Maceration, Atrophie Blanche, Cyanosis, Ecchymosis, Mottled, Pallor, Rubor, Erythema. Periwound temperature was noted as No Abnormality. The periwound has tenderness on palpation. 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 we will continue with Santyl on the right lower extremity and make arrangements for home health to apply the wound VAC and to be changed 3 times a week. She will come back and see as next week  for a wound check Deis, Yesly D. ([id number]) Procedures Wound #2 Wound #2 is a Venous Leg Ulcer located on the Left,Distal Lower Leg. A skin graft procedure using a bioengineered skin substitute/cellular or tissue based product was performed by [name], MD. Apligraf was applied and secured with Steri-Strips. 21 sq cm of product was utilized and 23 sq cm was wasted due to extra. Post Application, mepitel one was applied. A Time Out was conducted prior to the start of the procedure. The procedure was tolerated well with a pain level of 0 throughout and a pain level  of 0 following the procedure. Post procedure Diagnosis Wound #2: Same as Pre-Procedure . Plan Wound Cleansing: Wound #2 Left,Distal Lower Leg: Clean wound with Normal Saline. Wound #5 Right,Posterior Lower Leg: Clean wound with Normal Saline. Anesthetic: Wound #2 Left,Distal Lower Leg: Topical Lidocaine 4% cream applied to wound bed prior to debridement Wound #5 Right,Posterior Lower Leg: Topical Lidocaine 4% cream applied to wound bed prior to debridement Skin Barriers/Peri-Wound Care: Wound #2 Left,Distal Lower Leg: Skin Prep Wound #5 Right,Posterior Lower Leg: Skin Prep Primary Wound Dressing: Wound #2 Left,Distal Lower Leg: Other: - Apligraf applied Wound #5 Right,Posterior Lower Leg: Santyl Ointment - NPWT to be applied by Home Health nurse Secondary Dressing: Wound #2 Left,Distal Lower Leg: ABD pad Dry Gauze Wound #5 Right,Posterior Lower Leg: Gauze and Kerlix/Conform Dressing Change Frequency: Wound #2 Left,Distal Lower Leg: Change dressing every week - At [facility] Wound #5 Right,Posterior Lower Leg: Change dressing every day. - until NPWT applied Lehmkuhl, Sarah D. ([id number]) Follow-up Appointments: Wound #2 Left,Distal Lower Leg: Return Appointment in 1 week. Wound #5 Right,Posterior Lower Leg: Return Appointment in 1 week. Edema Control: Wound #2 Left,Distal Lower Leg: 2 Layer Compression System - Left Lower Extremity Elevate legs to the level of the heart and pump ankles as often as possible Wound #5 Right,Posterior Lower Leg: Patient to wear own compression stockings Elevate legs to the level of the heart and pump ankles as often as possible Negative Pressure Wound Therapy: Wound #5 Right,Posterior Lower Leg: Wound VAC settings at 125/130 mmHg continuous pressure. Use BLACK/GREEN foam to wound cavity. Use WHITE foam to fill any tunnel/s and/or undermining. Change VAC dressing 3 X WEEK. Change canister as indicated when full. Nurse may titrate settings and  frequency of dressing changes as clinically indicated. Home Health Nurse may d/c VAC for s/s of increased infection, significant wound regression, or uncontrolled drainage. [place] at [phone]. Number of foam/gauze pieces used in the dressing = For her left lower extremity we have applied the first Apligraf with the usual sterile precautions and she would have a Kerlix and Coban dressing on this leg for the next week. we will continue with Santyl on the right lower extremity and make arrangements for home health to apply the wound VAC and to be changed 3 times a week. She will come back and see as next week for a wound check Electronic Signature(s) Signed: 09/25/2015 4:47:59 PM By: [name], [name] Previous Signature: 09/23/2015 3:43:55 PM Version By: [name], [name] Entered By: [name] on 09/25/2015 16:47:59 Delisa, Danial D. ([id number]) -------------------------------------------------------------------------------- SuperBill Details Patient Name: [name] Stare D. Date of Service: 09/23/2015 Medical Record Patient Account Number: [id number] Number: [name], RN, [name], Treating RN: [birth year] (69 y.o. 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 L03.116 Cellulitis of left lower limb Facility Procedures CPT4: Description Modifier Quantity Code  [id number] (Facility Use Only) Apligraf 1 SQ CM 44 CPT4: [id number] - SKIN SUB GRAFT TRNK/ARM/LEG 1 ICD-10 Description Diagnosis E11.622 Type 2 diabetes mellitus with other skin ulcer L97.322 Non-pressure chronic ulcer of left ankle with fat layer exposed I89.0 Lymphedema, not elsewhere classified  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

## 2015-09-30 ENCOUNTER — Encounter: Payer: PPO | Attending: Surgery | Admitting: Surgery

## 2015-09-30 DIAGNOSIS — I83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: Secondary | ICD-10-CM | POA: Insufficient documentation

## 2015-09-30 DIAGNOSIS — E7800 Pure hypercholesterolemia, unspecified: Secondary | ICD-10-CM | POA: Insufficient documentation

## 2015-09-30 DIAGNOSIS — L97322 Non-pressure chronic ulcer of left ankle with fat layer exposed: Secondary | ICD-10-CM | POA: Diagnosis not present

## 2015-09-30 DIAGNOSIS — L03116 Cellulitis of left lower limb: Secondary | ICD-10-CM | POA: Diagnosis not present

## 2015-09-30 DIAGNOSIS — I2510 Atherosclerotic heart disease of native coronary artery without angina pectoris: Secondary | ICD-10-CM | POA: Insufficient documentation

## 2015-09-30 DIAGNOSIS — N189 Chronic kidney disease, unspecified: Secondary | ICD-10-CM | POA: Diagnosis not present

## 2015-09-30 DIAGNOSIS — I129 Hypertensive chronic kidney disease with stage 1 through stage 4 chronic kidney disease, or unspecified chronic kidney disease: Secondary | ICD-10-CM | POA: Diagnosis not present

## 2015-09-30 DIAGNOSIS — I83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: Secondary | ICD-10-CM | POA: Insufficient documentation

## 2015-09-30 DIAGNOSIS — I890 Lymphedema, not elsewhere classified: Secondary | ICD-10-CM | POA: Diagnosis not present

## 2015-09-30 DIAGNOSIS — E11622 Type 2 diabetes mellitus with other skin ulcer: Secondary | ICD-10-CM | POA: Insufficient documentation

## 2015-09-30 NOTE — Progress Notes (Addendum)
IMUNIQUE, TACHENY ([id number]) Visit Report for 09/30/2015 Chief Complaint Document Details Patient Name: [name], [name] 09/30/2015 10:45 Date of Service: AM Medical Record [id number] Number: Patient Account Number: [id number] June 17, 1946 (69 y.o. [name], RN, [name], Date of Birth/Sex: Treating RN: Female) Administrator, sports Primary Care Physician: Glendon Axe Other Clinician: Referring Physician: Glendon Axe Treating Meliza Kage Physician/Extender: Weeks in Treatment: 34 Information Obtained from: Patient Chief Complaint Patient presents to the wound care center for a consult due non healing wound 69 year old patient comes with a history of having a ulcer on the left lower extremity for the past 4 weeks. she says she's had swelling of both lower extremities for about a year after she started having prednisone. 02/07/2015 -- her vascular appointments obtained were in the first and third week of June. she is able to go to [place] and we will try and get her some earlier appointments. Other than that nothing else has changed in her management. Electronic Signature(s) Signed: 09/30/2015 11:18:07 AM By: [name], [name] Entered By: [name] on 09/30/2015 11:18:07 [name], [name] ([id number]) -------------------------------------------------------------------------------- HPI Details Patient Name: [name], [name] 09/30/2015 10:45 Date of Service: AM Medical Record [id number] Number: Patient Account Number: [id number] 01/26/1946 (69 y.o. [name], RN, [name], Date of Birth/Sex: Treating RN: Female) Administrator, sports Primary Care Physician: Glendon Axe Other Clinician: Referring Physician: Glendon Axe Treating Ronte Parker Physician/Extender: Weeks in Treatment: 2 History of Present Illness HPI Description: 69 year old patient who is known to have diabetes mellitus type 2, chronic renal insufficiency, coronary artery disease, hypertension, hypercholesterolemia, temporal arteritis  and inflammatory arthritiss also has a history of having a hysterectomy and some orthopedic related surgeries. The ulcer on the left lower extremity started off as a blister and then. Got progressively worse. She does not have any fever or chills and has not had any recent surgical intervention for this. Her last hemoglobin A1c was 10.1 in September 2015. She has been recently put on doxycycline by her PCP. She is now also allergic to doxycycline and was this was changed over to Keflex. 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 She does see a cardiologist and also takes a diuretic. 02/07/2015 her arterial and venous duplex studies to be done have dates been given as the first and third week of June. This is at [facility]. We are going to try and get early appointments at [facility]. other than that nothing has changed in her management. 02/14/2015 -- we have been able to get her an appointment in [facility] on May 20 which is much earlier than her previous ones at [place]. She continues with her prednisone and her sugars are in the range of 150-200. 02/21/2015 We were able to get a vascular lab workup for her today and she is going to be there at 2:00 this afternoon. the swelling of her leg has gone down significantly but she still has some tenderness over the wounds. 02/28/2015 - She has had one of two vascular workups done, and this coming Tuesday has another, at [place] [facility]. She continues to be on steroid medications. She has significant sensitivity in her left lower extremity and has pain suggestive of neuropathic pain and I have asked her to address this with her primary care physician. 03/07/2015 -- The patient saw Dr. Lucky Cowboy for a consultation and he has had her arteries are okay but she has 2  incompetent veins on the left lower extremity and he is  going to set her up for surgery. Official report is awaited. Addendum: Official reports are now available and on 03/04/2015. She was seen and lower extremity venous duplex exam was done. There was reflux present within the left greater saphenous vein below the knee and also the left small saphenous vein. Arterial duplex showed normal triphasic waveforms throughout the left lower extremity without any significant stenosis. Her ABIs were noncompressible bilaterally but a waveforms were normal and a digital pressures were normal bilaterally consistent with no significant arterial insufficiency. He has recommended endovenous ablation of both the left small saphenous and the left great saphenous vein. This would still be scheduled later. 03/14/2015 -- she has heard back from the vascular office and has surgery scheduled for sometime in July. [name], [name] ([id number]) Her rheumatologist has decreased her prednisone dosage but she still on it. She has also had cataract surgery in her right eye recently this week. 03/20/2015 - No new complaints today. Pain improved. No fever or chills. Tolerating 2 layer compression. 04/14/2015 -- she was doing very well today she went off on vacation and now her edema has increased markedly the ulceration is bigger and her diabetes is not under control. 04/21/2015 -- I spoke to her PCP Dr. Candiss Norse and discussed the management which would include being seen by a general surgeon for debridement and taking multiple punch biopsies which would help in establishing the diagnosis of this is a vasculitis. She is agreeable about this and will set her up for the procedure with Dr. Tamala Julian at [facility]. She was seen by the surgeon Dr. Jamal Collin. 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 She is scheduled to have laser ablation done next week.Pt was sent here for  possible biopsy to look for vasculitis. Feel it would be better to wait after laser ablation is completed- the ulcer may heal fully and biopsy may not be necessary 04/29/2015 -- she had the venous ablation done by Dr. Lucky Cowboy last Friday and we do not have any notes yet. She is doing fine otherwise. 05/06/2015 --Review of her recent vascular intervention shows that she was seen by Dr. Lucky Cowboy on 04/29/2015. The follow-up duplex which was done showed that both the great saphenous vein and the small saphenous vein remained patent with reflux consistent with an unsuccessful ablation. He has rescheduled her for another the endovenous ablation to be done in about 4 weekso time. 05/13/2015 -- he was seen by her surgeon Dr. Jamal Collin who asked her to continue with conservative therapy and he would speak to Dr. Lucky Cowboy about her management. Dr. Lucky Cowboy is going to schedule her surgery in the middle of August for a repeat endovenous ablation. Her pus culture from last week has grown : [place] her noted her sensitivity report but due to her multiple allergies I had tried clindamycin and she developed a rash with this too. She has been prescribed and anti-buttocks in the ER and is has it at home and she will let is know what she is going to be taking. 05/20/2015 -- she has developed a small spot on her right lower extremity but besides that it is not a full fledged ulceration. She did not get to see Dr. Lucky Cowboy last week and hopefully she will see him in the near future. 05/27/2015 -she  is still awaiting her appointment with Dr.Dew and her vascular procedure is not scheduled until August 19. She will be seeing her PCP tomorrow and I have asked her to convey our discussion so that she is aware that debridement has not been done yet. 06/03/2015 -- was seen by her rheumatologist Dr. 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 He is lowering her prednisone to 12-1/2 mg for 1 month and then 10 mg per the next month. I will again make an attempt to speak to her PCP Dr. Candiss Norse and her surgeon Dr. 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 06/17/2015 -- Dr.Dew did her vascular procedure last week and a follow-up venous ultrasound shows good resolution of the veins as per the patient's history. He is to see her back in 2 weeks. [name], [name] ([id number]) 07/07/2015. -- the patient has had a heavy growth of Proteus mirabilis and Enterococcus faecalis. These are sensitive to several drugs but the problem is she has allergies to all of these and hence I would like her to see Dr. Ola Spurr for this. She is also due to see Dr. Lucky Cowboy tomorrow and I will discussed the management with him including debridement under anesthesia and possible biopsies. 07/14/2015 -- she has an appointment to see Dr. Ola Spurr tomorrow and did see Dr. Bunnie Domino PA who will discuss my request with him. 07/21/2015 -- saw Dr. Ola Spurr was able to do a test on her and has put her on amoxicillin. She has been tolerating that and has had no problems with allergies to this. 07/28/2015 -- Last Friday I spoke to Dr. Leotis Pain regarding her care and he said that her right leg did not need any surgery and on the left leg was doing pretty good. We did agree that if she undergoes any procedure in the future he would do a couple of punch biopsies of the wound. 08/18/2015 -- her right leg is very tender and there is significant amount of slough. The left leg is looking much better 09/02/2015 -- she is going to have a debridement and punch biopsies of her right lower extremity by Dr. Leotis Pain this coming Thursday. Also seen Dr. Ola Spurr who has continued her  on ciprofloxacin. 09/09/2015 -- on 09/04/2015 Dr. Leotis Pain took her to surgery - Irrigation and excisional debridement of skin, soft tissue, and muscle to about 40 cm2 to the right posterior calf with biopsy. Pathology results are -- DIAGNOSIS: SKIN, RIGHT LOWER EXTREMITY; BIOPSY: SKIN AND SOFT TISSUE WITH ULCERATION, SEE NOTE. - NEGATIVE FOR DYSPLASIA AND MALIGNANCY. Note: There is acute inflammation and ulceration of the epidermis. The dermis shows nonspecific inflammation, neovascularization, and hemosiderin deposition. The differential diagnosis for these findings includes stasis dermatitis, nonspecific dermatitis, and infection. Correlation with clinical findings is required. A PAS fungal stain is obtained and results will be reported in an addendum. cultures were also sent and this grew Pseudomonas aeruginosa, Escherichia coli, Proteus mirabilis, Klebsiella pneumoniae and they were all sensitive to ciprofloxacin which she is on. 09/16/2015 -- he saw Dr. 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 He is going to call the patient in and discuss with her the management possibly with Imuran.  I will continue treating her locally. 09/23/2015 -- she saw Dr. Ola Spurr today who was going to continue the antibiotics for now and stop after this course. She has an appointment to see Dr. Jefm Bryant in about a week's time. Her wound VAC has arrived but she did not bring it with her today. We will try and set her up for changes to the right leg 3 times a week. She is here for her first application of Apligraf to the left lower extremity. 09/30/2015 -- she has seen Dr. 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 because she is ambulatory at home health will not apply a wound VAC and she will  have to come here 3 times a week on Monday Wednesday and Friday. Electronic Signature(s) Signed: 09/30/2015 11:19:05 AM By: [name], [name] Entered By: [name] on 09/30/2015 11:19:05 [name], [name] ([id number]) [name], [name] ([id number]) -------------------------------------------------------------------------------- Physical Exam Details Patient Name: [name], [name] 09/30/2015 10:45 Date of Service: AM Medical Record [id number] Number: Patient Account Number: [id number] 26-Jun-1946 (69 y.o. [name], RN, [name], Date of Birth/Sex: Treating RN: Female) [place] Primary Care Physician: Glendon Axe Other Clinician: Referring Physician: Candiss Norse, [name] Treating Tekoa Amon Physician/Extender: Weeks in Treatment: 34 Constitutional . Pulse regular. Respirations normal and unlabored. Afebrile. . Eyes Nonicteric. Reactive to light. Ears, Nose, Mouth, and Throat Lips, teeth, and gums WNL.Marland Kitchen Moist mucosa without lesions . Neck supple and nontender. No palpable supraclavicular or cervical adenopathy. Normal sized without goiter. Respiratory WNL. No retractions.. Cardiovascular Pedal Pulses WNL. No clubbing, cyanosis or edema. Chest Breasts symmetical and no nipple discharge.. Breast tissue WNL, no masses, lumps, or tenderness.. Lymphatic No adneopathy. No adenopathy. No adenopathy. Musculoskeletal Adexa without tenderness or enlargement.. Digits and nails w/o clubbing, cyanosis, infection, petechiae, ischemia, or inflammatory conditions.. Integumentary (Hair, Skin) No suspicious lesions. No crepitus or fluctuance. No peri-wound warmth or erythema. No masses.Marland Kitchen Psychiatric Judgement and insight Intact.. No evidence of depression, anxiety, or agitation.. Notes the left lower eczema and he has been covered with Apligraf and we will change her dressing appropriately. The right lower extremity continues to have some slough and we will apply Santyl a nonadherent dressing and  then the wound VAC. Electronic Signature(s) Signed: 09/30/2015 11:19:51 AM By: [name], [name] Entered By: [name] on 09/30/2015 11:19:51 Kawano, TAMBRA CARKHUFF ([id number]) -------------------------------------------------------------------------------- Physician Orders Details Patient Name: ILAYDA, LONGEST [name] 09/30/2015 10:45 Date of Service: AM Medical Record [id number] Number: Patient Account Number: [id number] September 07, 1946 (69 y.o. 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 Wound #5 Right,Posterior Lower Leg o Clean wound with Normal Saline. Anesthetic Wound #2 Left,Distal Lower Leg o Topical Lidocaine 4% cream applied to wound bed prior to debridement Wound #  5 Right,Posterior Lower Leg o Topical Lidocaine 4% cream applied to wound bed prior to debridement Skin Barriers/Peri-Wound Care Wound #2 Left,Distal Lower Leg o Skin Prep o Barrier cream Wound #5 Right,Posterior Lower Leg o Skin Prep Primary Wound Dressing Santone, Ressie [name] ([id number]) Wound #2 Left,Distal Lower Leg o Other: - Apligraf remains in place;recheck Wound #5  Right,Posterior Lower Leg o Santyl Ointment - NPWT applied Secondary Dressing Wound #2 Left,Distal Lower Leg o ABD pad o Dry Gauze Wound #5 Right,Posterior Lower Leg o Non-adherent pad Dressing Change Frequency Wound #2 Left,Distal Lower Leg o Change dressing every week - At [facility] Wound #5 Right,Posterior Lower Leg o Change Dressing Monday, Wednesday, Friday Follow-up Appointments Wound #2 Left,Distal Lower Leg o Return Appointment in 1 week. Wound #5 Right,Posterior Lower Leg o Return Appointment in 1 week. Edema Control Wound #2 Left,Distal Lower Leg o 2 Layer Compression System - Left Lower Extremity o Elevate legs to the level of the heart and pump ankles as often as possible Wound #5 Right,Posterior Lower Leg o Elevate legs to the level of the heart and pump ankles as often as possible Negative Pressure Wound Therapy Wound #5 Right,Posterior Lower Leg o Wound VAC settings at 125/130 mmHg continuous pressure. Use BLACK/GREEN foam to wound cavity. Use WHITE foam to fill any tunnel/s and/or undermining. Change VAC dressing 3 X WEEK. Change canister as indicated when full. Nurse may titrate settings and frequency of dressing changes as clinically indicated. o Number of foam/gauze pieces used in the dressing = - i piece of black foam applied Electronic Signature(s) Signed: 09/30/2015 11:45:53 AM By: Regan Lemming [name], RN Kamara, Tabor [name] ([id number]) Signed: 09/30/2015 4:34:28 PM By: [name], [name] Previous Signature: 09/30/2015 11:20:53 AM Version By: Regan Lemming [name], RN Entered By: Regan Lemming on 09/30/2015 11:45:53 Veltri, REATHER HALILOVIC ([id number]) -------------------------------------------------------------------------------- Problem List Details Patient Name: VINISHA, LAPLUME 09/30/2015 10:45 Date of Service: AM Medical Record [id number] Number: Patient Account Number: [id number] 04-22-1946 (69 y.o. 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 ([id number]) -------------------------------------------------------------------------------- Progress Note Details Patient Name: [name], [name] 09/30/2015 10:45 Date of Service: AM Medical Record [id number] Number: Patient Account Number: [id number] 12-03-1945 (69 y.o. [name], RN, [name], Date of Birth/Sex: Treating RN: Female) Administrator, sports Primary Care Physician: Glendon Axe Other Clinician: Referring Physician: Glendon Axe Treating Rawley Harju Physician/Extender: Weeks in Treatment: 34 Subjective Chief Complaint Information obtained from Patient Patient presents to the wound care center for a consult due non healing wound 69 year old patient comes with a history of having a ulcer on the left lower extremity for the past 4 weeks. she says she's had swelling of both lower extremities for about a year after  she started having prednisone. 02/07/2015 -- her vascular appointments obtained were in the  first and third week of June. she is able to go to [place] and we will try and get her some earlier appointments. Other than that nothing else has changed in her management. History of Present Illness (HPI) 69 year old patient who is known to have diabetes mellitus type 2, chronic renal insufficiency, coronary artery disease, hypertension, hypercholesterolemia, temporal arteritis and inflammatory arthritiss also has a history of having a hysterectomy and some orthopedic related surgeries. The ulcer on the left lower extremity started off as a blister and then. Got progressively worse. She does not have any fever or chills and has not had any recent surgical intervention for this. Her last hemoglobin A1c was 10.1 in September 2015. She has been recently put on doxycycline by her PCP. She is now also allergic to doxycycline and was this was changed over to Keflex. 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 She does see a cardiologist and also takes a diuretic. 02/07/2015 her arterial and venous duplex studies to be done have dates been given as the first and third week of June. This is at [facility]. We are going to try and get early appointments at [facility]. other than that nothing has changed in her management. 02/14/2015 -- we have been able to get her an appointment in [facility] on May 20 which is much earlier than her previous ones at [place]. She continues with her prednisone and her sugars are in the range of 150-200. 02/21/2015 We were able to get a vascular lab workup for her today and she is going to be there at 2:00 this afternoon. the swelling of her leg has gone down significantly but she still has some tenderness over the wounds. 02/28/2015 - She has had one of two vascular workups done, and this coming Tuesday has another, at [place] [facility]. She continues to be on steroid medications. She has significant sensitivity in her left lower extremity and has pain suggestive of neuropathic pain and I have asked her to address this with her primary care physician. 03/07/2015 -- The patient saw Dr. Lucky Cowboy for a consultation and he has had her arteries are okay but she has 2 incompetent veins on the left lower extremity and he is going to set her up for surgery. Official report is TIYA, BRUNER ([id number]) awaited. Addendum: Official reports are now available and on 03/04/2015. She was seen and lower extremity venous duplex exam was done. There was reflux present within the left greater saphenous vein below the knee and also the left small saphenous vein. Arterial duplex showed normal triphasic waveforms throughout the left lower extremity without any significant stenosis. Her ABIs were noncompressible bilaterally but a waveforms were normal and a digital pressures were normal bilaterally consistent with no significant arterial insufficiency. He has recommended endovenous ablation of both the left small saphenous and the left great saphenous vein. This would still be scheduled later. 03/14/2015 -- she has heard back from the vascular office and has surgery scheduled for sometime in July. Her rheumatologist has decreased her prednisone dosage but she still on it. She has also had cataract surgery in her right eye recently this week. 03/20/2015 - No new complaints today. Pain improved. No fever or chills. Tolerating 2 layer compression. 04/14/2015 -- she was doing very well today  she went off on vacation and now her edema has increased markedly the ulceration is bigger and her diabetes is not under control. 04/21/2015 -- I spoke to her PCP Dr. Candiss Norse and discussed the management which would include being seen by a general surgeon for debridement and taking multiple punch biopsies which would help in establishing the diagnosis of this is  a vasculitis. She is agreeable about this and will set her up for the procedure with Dr. Tamala Julian at [facility]. She was seen by the surgeon Dr. Jamal Collin. 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 She is scheduled to have laser ablation done next week.Pt was sent here for possible biopsy to look for vasculitis. Feel it would be better to wait after laser ablation is completed- the ulcer may heal fully and biopsy may not be necessary 04/29/2015 -- she had the venous ablation done by Dr. Lucky Cowboy last Friday and we do not have any notes yet. She is doing fine otherwise. 05/06/2015 --Review of her recent vascular intervention shows that she was seen by Dr. Lucky Cowboy on 04/29/2015. The follow-up duplex which was done showed that both the great saphenous vein and the small saphenous vein remained patent with reflux consistent with an unsuccessful ablation. He has rescheduled her for another the endovenous ablation to be done in about 4 weeks time. 05/13/2015 -- he was seen by her surgeon Dr. Jamal Collin who asked her to continue with conservative therapy and he would speak to Dr. Lucky Cowboy about her management. Dr. Lucky Cowboy is going to schedule her surgery in the middle of August for a repeat endovenous ablation. Her pus culture from last week has grown : [place] her noted her sensitivity report but due to her multiple allergies I had tried clindamycin and she developed a rash with this too. She has been prescribed and anti-buttocks in the ER and is has it at home and she will let is know what she is going to be taking. 05/20/2015 -- she has developed a small spot on her right lower extremity but besides that it is not a full fledged ulceration. She did not get to see Dr. Lucky Cowboy last week and hopefully she will see  him in the near future. 05/27/2015 -she is still awaiting her appointment with Dr.Dew and her vascular procedure is not scheduled until August 19. She will be seeing her PCP tomorrow and I have asked her to convey our discussion so that she is aware that debridement has not been done yet. IZONA, STUDE ([id number]) 06/03/2015 -- was seen by her rheumatologist Dr. 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 He is lowering her prednisone to 12-1/2 mg for 1 month and then 10 mg per the next month. I will again make an attempt to speak to her PCP Dr. Candiss Norse and her surgeon Dr. 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 06/17/2015 -- Dr.Dew did her vascular procedure last week and a follow-up venous ultrasound shows good resolution of the veins as per the patient's history. He is to see her back in 2 weeks. 07/07/2015. -- the patient has had a heavy growth of Proteus mirabilis and Enterococcus faecalis. These are  sensitive to several drugs but the problem is she has allergies to all of these and hence I would like her to see Dr. Ola Spurr for this. She is also due to see Dr. Lucky Cowboy tomorrow and I will discussed the management with him including debridement under anesthesia and possible biopsies. 07/14/2015 -- she has an appointment to see Dr. Ola Spurr tomorrow and did see Dr. Bunnie Domino PA who will discuss my request with him. 07/21/2015 -- saw Dr. Ola Spurr was able to do a test on her and has put her on amoxicillin. She has been tolerating that and has had no problems with allergies to this. 07/28/2015 -- Last Friday I spoke to Dr. Leotis Pain regarding her care and he said that her right leg did not need any surgery and on the left leg was doing pretty good. We did agree that if she  undergoes any procedure in the future he would do a couple of punch biopsies of the wound. 08/18/2015 -- her right leg is very tender and there is significant amount of slough. The left leg is looking much better 09/02/2015 -- she is going to have a debridement and punch biopsies of her right lower extremity by Dr. Leotis Pain this coming Thursday. Also seen Dr. Ola Spurr who has continued her on ciprofloxacin. 09/09/2015 -- on 09/04/2015 Dr. Leotis Pain took her to surgery - Irrigation and excisional debridement of skin, soft tissue, and muscle to about 40 cm2 to the right posterior calf with biopsy. Pathology results are -- DIAGNOSIS: SKIN, RIGHT LOWER EXTREMITY; BIOPSY: SKIN AND SOFT TISSUE WITH ULCERATION, SEE NOTE. - NEGATIVE FOR DYSPLASIA AND MALIGNANCY. Note: There is acute inflammation and ulceration of the epidermis. The dermis shows nonspecific inflammation, neovascularization, and hemosiderin deposition. The differential diagnosis for these findings includes stasis dermatitis, nonspecific dermatitis, and infection. Correlation with clinical findings is required. A PAS fungal stain is obtained and results will be reported in an addendum. cultures were also sent and this grew Pseudomonas aeruginosa, Escherichia coli, Proteus mirabilis, Klebsiella pneumoniae and they were all sensitive to ciprofloxacin which she is on. 09/16/2015 -- he saw Dr. 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 He is going to call the patient in and discuss with her the management possibly with Imuran. I will continue treating her locally. 09/23/2015 -- she saw Dr. Ola Spurr today who was going to continue the antibiotics for now and stop after this course. She has an appointment to see Dr. Jefm Bryant in about a week's time. Her wound VAC has arrived but she did not bring it with her today. We  will try and set her up for changes to the right leg 3 times a week. She is here for her first application of Apligraf to the left lower extremity. EVELLA, FALLER ([id number]) 09/30/2015 -- she has seen Dr. 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 because she is ambulatory at home health will not apply a wound VAC and she will have to come here 3 times a week on Monday Wednesday and Friday. Objective Constitutional Pulse regular. Respirations normal and unlabored. Afebrile. Vitals Time Taken: 11:00 AM, Height: 65 in, Weight: 248 lbs, BMI: 41.3, Temperature: 97.5 F, Pulse: 86 bpm, Respiratory Rate: 17 breaths/min, Blood Pressure: 143/64 mmHg. Eyes Nonicteric. Reactive to light. Ears, Nose, Mouth, and Throat Lips,  teeth, and gums WNL.Marland Kitchen Moist mucosa without lesions . Neck supple and nontender. No palpable supraclavicular or cervical adenopathy. Normal sized without goiter. Respiratory WNL. No retractions.. Cardiovascular Pedal Pulses WNL. No clubbing, cyanosis or edema. Chest Breasts symmetical and no nipple discharge.. Breast tissue WNL, no masses, lumps, or tenderness.. Lymphatic No adneopathy. No adenopathy. No adenopathy. Musculoskeletal Adexa without tenderness or enlargement.. Digits and nails w/o clubbing, cyanosis, infection, petechiae, ischemia, or inflammatory conditions.Marland Kitchen Psychiatric Judgement and insight Intact.. No evidence of depression, anxiety, or agitation.. General Notes: the left lower eczema and he has been covered with Apligraf and we will change her dressing appropriately. The right lower extremity continues to have some slough and we will apply Santyl a Riedesel, Yeraldy [name] ([id number]) nonadherent dressing and then the wound VAC. Integumentary (Hair, Skin) No suspicious lesions. No crepitus or fluctuance. No peri-wound warmth or erythema. No masses.. Wound #2 status is  Open. Original cause of wound was Blister. The wound is located on the Left,Distal Lower Leg. The wound measures 5cm length x 7cm width x 0.2cm depth; 27.489cm^2 area and 5.498cm^3 volume. Wound #5 status is Open. Original cause of wound was Gradually Appeared. The wound is located on the Right,Posterior Lower Leg. The wound measures 5.5cm length x 7cm width x 0.7cm depth; 30.238cm^2 area and 21.166cm^3 volume. 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 Wound #5 Right,Posterior Lower Leg: Clean wound with Normal Saline. Anesthetic: Wound #2 Left,Distal Lower Leg: Topical Lidocaine 4% cream applied to wound bed prior to debridement Wound #5 Right,Posterior Lower Leg: Topical Lidocaine 4% cream applied to wound bed prior to debridement Skin Barriers/Peri-Wound Care: Wound #2 Left,Distal Lower Leg: Skin Prep Barrier cream Ponti, [name] ([id number]) Wound #5 Right,Posterior Lower Leg: Skin Prep Primary Wound Dressing: Wound #2 Left,Distal Lower Leg: Other: - Apligraf remains in place;recheck Wound #5 Right,Posterior Lower Leg: Santyl Ointment - NPWT applied Secondary Dressing: Wound #2 Left,Distal Lower Leg: ABD pad Dry Gauze Wound #5 Right,Posterior Lower Leg: Non-adherent pad Dressing Change Frequency: Wound #2 Left,Distal Lower Leg: Change dressing every week - At [facility] Wound #5 Right,Posterior Lower Leg: Change Dressing Monday, Wednesday,  Friday Follow-up Appointments: Wound #2 Left,Distal Lower Leg: Return Appointment in 1 week. Wound #5 Right,Posterior Lower Leg: Return Appointment in 1 week. Edema Control: Wound #2 Left,Distal Lower Leg: 2 Layer Compression System - Left Lower Extremity Elevate legs to the level of the heart and pump ankles as often as possible Wound #5 Right,Posterior Lower Leg: Elevate legs to the level of the heart and pump ankles as often as possible Negative Pressure Wound Therapy: Wound #5 Right,Posterior Lower Leg: Wound VAC settings at 125/130 mmHg continuous pressure. Use BLACK/GREEN foam to wound cavity. Use WHITE foam to fill any tunnel/s and/or undermining. Change VAC dressing 3 X WEEK. Change canister as indicated when full. Nurse may titrate settings and frequency of dressing changes as clinically indicated. Number of foam/gauze pieces used in the dressing = - i piece of black foam applied For her left lower extremity we  have the Apligraf in place and she would have a Kerlix and Coban dressing on this leg for the next week and she will be back for a second application. We will continue with Santyl on the right lower extremity and make arrangements for [place] apply the wound VAC 3 times a week. She will come back and see as next week for a review. MARIHANNA, BETSCHART ([id number]) Electronic Signature(s) Signed: 09/30/2015 4:39:05 PM By: [name], [name] Previous Signature: 09/30/2015 11:21:50 AM Version By: [name], [name] Entered By: [name] on 09/30/2015 16:39:05 Brummond, Nadene [name] ([id number]) -------------------------------------------------------------------------------- SuperBill Details Patient Name: LANIQUA, HAMSHER [name] Date of Service: 09/30/2015 Medical Record Patient Account Number: [id number] Number: [name], RN, [name], Treating RN: 1946-04-13 (69 y.o. 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 L03.116 Cellulitis of left lower limb Facility Procedures CPT4: Description Modifier Quantity Code [id number] - WOUND VAC-50 SQ CM OR LESS 1 CPT4: [id number] (Facility Use Only) [id number] - [place] [facility] 1 LEG Physician Procedures CPT4: Description Modifier Quantity Code [id number] - WC PHYS LEVEL 3 - EST PT 1 ICD-10 Description Diagnosis L97.322 Non-pressure chronic ulcer of left ankle with fat layer exposed E11.622 Type 2 diabetes mellitus with other skin ulcer I89.0  Lymphedema, not elsewhere classified 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 ([id number]) Signed: 09/30/2015 4:34:28 PM By: [name], [name] Previous Signature: 09/30/2015 11:22:34 AM Version By: [name], [name] Previous Signature: 09/30/2015 11:22:12 AM Version By: [name], [name] Entered By: Regan Lemming on 09/30/2015 16:31:27

## 2015-09-30 NOTE — Progress Notes (Addendum)
ALFARETTA, SCHOENECKER ([id number]) Visit Report for 09/30/2015 Arrival Information Details Patient Name: [name], [name] Date of Service: 09/30/2015 10:45 AM Medical Record Number: [id number] Patient Account Number: [id number] Date of Birth/Sex: 1946-06-20 (68 y.o. Female) Treating RN: [name], RN, [name], [name] Primary Care Physician: Glendon Axe Other Clinician: Referring Physician: Glendon Axe Treating Physician/Extender: Frann Rider in Treatment: 68 Visit Information History Since Last Visit Any new allergies or adverse reactions: No Patient Arrived: [name] Had a fall or experienced change in No Arrival Time: 10:56 activities of daily living that may affect Accompanied By: grddtr risk of falls: Transfer Assistance: None Signs or symptoms of abuse/neglect since last No Patient Identification Verified: Yes visito Secondary Verification Process Yes Hospitalized since last visit: No Completed: Has Dressing in Place as Prescribed: Yes Patient Has Alerts: Yes Pain Present Now: No Patient Alerts: Patient on Blood Thinner Electronic Signature(s) Signed: 09/30/2015 10:57:08 AM By: Regan Lemming [name], RN Entered By: Regan Lemming on 09/30/2015 10:57:08 Casteneda, Gabriela D. ([id number]) -------------------------------------------------------------------------------- Encounter Discharge Information Details Patient Name: [name], [name] D. Date of Service: 09/30/2015 10:45 AM Medical Record Number: [id number] Patient Account Number: [id number] Date of Birth/Sex: April 29, 1946 (68 y.o. Female) Treating RN: [name], RN, [name], [name] Primary Care Physician: Glendon Axe Other Clinician: Referring Physician: Glendon Axe Treating Physician/Extender: Frann Rider in Treatment: 15 Encounter Discharge Information Items Discharge Pain Level: 0 Discharge Condition: Stable Ambulatory Status: Ambulatory Discharge Destination: Home Transportation: Private Auto Accompanied By: grd dtr Schedule  Follow-up Appointment: No Medication Reconciliation completed and provided to Patient/Care No Arvella Massingale: Provided on Clinical Summary of Care: 09/30/2015 Form Type Recipient Paper Patient LM Electronic Signature(s) Signed: 09/30/2015 2:48:33 PM By: Regan Lemming [name], RN Previous Signature: 09/30/2015 11:47:29 AM Version By: Ruthine Dose Entered By: Regan Lemming on 09/30/2015 14:48:33 [name], [name] ([id number]) -------------------------------------------------------------------------------- Lower Extremity Assessment Details Patient Name: Kristin Stare D. Date of Service: 09/30/2015 10:45 AM Medical Record Number: [id number] Patient Account Number: [id number] Date of Birth/Sex: [birth year] (68 y.o. Female) Treating RN: [name], RN, [name], [place] Primary Care Physician: Glendon Axe Other Clinician: Referring Physician: Glendon Axe Treating Physician/Extender: Frann Rider in Treatment: 34 Edema Assessment Assessed: [Left: No] [Right: No] E[Left: dema] [Right: :] Calf Left: Right: Point of Measurement: 30 cm From Medial Instep 38.6 cm 36.4 cm Ankle Left: Right: Point of Measurement: 9 cm From Medial Instep 25.8 cm 26.9 cm Vascular Assessment Claudication: Claudication Assessment [Left:None] [Right:None] Pulses: Posterior Tibial Palpable: [Left:Yes] Dorsalis Pedis Palpable: [Left:Yes] [Right:Yes] Extremity colors, hair growth, and conditions: Extremity Color: [Left:Normal] [Right:Normal] Hair Growth on Extremity: [Left:No] [Right:No] Temperature of Extremity: [Left:Warm] [Right:Warm] Capillary Refill: [Left:< 3 seconds] [Right:< 3 seconds] Toe Nail Assessment Left: Right: Thick: No No Discolored: No No Deformed: No No Improper Length and Hygiene: No No Electronic Signature(s) Signed: 09/30/2015 11:12:30 AM By: Regan Lemming [name], RN [name], [name] ([id number]) Entered By: Regan Lemming on 09/30/2015 11:12:30 [name], [name]  ([id number]) -------------------------------------------------------------------------------- Multi Wound Chart Details Patient Name: Kristin Stare D. Date of Service: 09/30/2015 10:45 AM Medical Record Number: [id number] Patient Account Number: [id number] Date of Birth/Sex: [birth year] (68 y.o. Female) Treating RN: Baruch Gouty, RN, [name], [name] Primary Care Physician: Glendon Axe Other Clinician: Referring Physician: Glendon Axe Treating Physician/Extender: Frann Rider in Treatment: 34 Vital Signs Height(in): 65 Pulse(bpm): 86 Weight(lbs): 248 Blood Pressure 143/64 (mmHg): Body Mass Index(BMI): 41 Temperature(F): 97.5 Respiratory Rate 17 (breaths/min): Photos: [2:No Photos] [5:No Photos] [N/A:N/A] Wound Location: [2:Left, Distal Lower Leg] [5:Right, Posterior Lower Leg] [N/A:N/A] Wounding Event: [2:Blister] [5:Gradually Appeared] [N/A:N/A]  Primary Etiology: [2:Venous Leg Ulcer] [5:Venous Leg Ulcer] [N/A:N/A] Date Acquired: [2:12/30/2014] [5:04/29/2015] [N/A:N/A] Weeks of Treatment: [2:34] [5:19] [N/A:N/A] Wound Status: [2:Open] [5:Open] [N/A:N/A] Clustered Wound: [2:Yes] [5:No] [N/A:N/A] Measurements L x W x D 5x7x0.2 [5:5.5x7x0.7] [N/A:N/A] (cm) Area (cm) : [id number] [5:30.238] [N/A:N/A] Volume (cm) : [id number] [5:21.166] [N/A:N/A] % Reduction in Area: [2:-3788.10%] [5:-15984.00%] [N/A:N/A] % Reduction in Volume: -7643.70% [5:-111300.00%] [N/A:N/A] Classification: [2:Full Thickness Without Exposed Support Structures] [5:Full Thickness Without Exposed Support Structures] [N/A:N/A] Periwound Skin Texture: No Abnormalities Noted [5:No Abnormalities Noted] [N/A:N/A] Periwound Skin [2:No Abnormalities Noted] [5:No Abnormalities Noted] [N/A:N/A] Moisture: Periwound Skin Color: No Abnormalities Noted [5:No Abnormalities Noted] [N/A:N/A] Tenderness on [2:No] [5:No] [N/A:N/A] Treatment Notes Electronic Signature(s) Signed: 09/30/2015 11:13:55 AM By: Regan Lemming [name],  RN [name], [name] ([id number]) Entered By: Regan Lemming on 09/30/2015 11:13:54 [name], [name] ([id number]) -------------------------------------------------------------------------------- [place] Details Patient Name: JAYDEEN, TOCCO D. Date of Service: 09/30/2015 10:45 AM Medical Record Number: [id number] Patient Account Number: [id number] Date of Birth/Sex: [birth year] (68 y.o. Female) Treating RN: [name], RN, [name], [name] Primary Care Physician: Glendon Axe Other Clinician: Referring Physician: Glendon Axe Treating Physician/Extender: Frann Rider in Treatment: 2 Active Inactive Orientation to the Wound Care Program Nursing Diagnoses: Knowledge deficit related to the wound healing center program Goals: Patient/caregiver will verbalize understanding of the [place] Program Date Initiated: 01/31/2015 Goal Status: Active Interventions: Provide education on orientation to the wound center Notes: Venous Leg Ulcer Nursing Diagnoses: Potential for venous Insuffiency (use before diagnosis confirmed) Goals: Non-invasive venous studies are completed as ordered Date Initiated: 01/31/2015 Goal Status: Active Patient/caregiver will verbalize understanding of disease process and disease management Date Initiated: 01/31/2015 Goal Status: Active Interventions: Assess peripheral edema status every visit. Notes: Wound/Skin Impairment Nursing Diagnoses: Impaired tissue integrity Knowledge deficit related to smoking impact on wound healing [name], [name] ([id number]) Goals: Patient/caregiver will verbalize understanding of skin care regimen Date Initiated: 01/31/2015 Goal Status: Active Ulcer/skin breakdown will heal within 14 weeks Date Initiated: 01/31/2015 Goal Status: Active Interventions: Assess ulceration(s) every visit Notes: Electronic Signature(s) Signed: 09/30/2015 11:13:43 AM By: Regan Lemming [name], RN Entered By: Regan Lemming on 09/30/2015  11:13:43 [name], [name] ([id number]) -------------------------------------------------------------------------------- Pain Assessment Details Patient Name: Kristin Stare D. Date of Service: 09/30/2015 10:45 AM Medical Record Number: [id number] Patient Account Number: [id number] Date of Birth/Sex: 10-27-1945 (68 y.o. Female) Treating RN: [name], RN, [name], [name] Primary Care Physician: Glendon Axe Other Clinician: Referring Physician: Glendon Axe Treating Physician/Extender: Frann Rider in Treatment: 34 Active Problems Location of Pain Severity and Description of Pain Patient Has Paino Yes Site Locations Pain Location: Pain in Ulcers With Dressing Change: Yes Duration of the Pain. Constant / Intermittento Constant Rate the pain. Current Pain Level: 6 Character of Pain Describe the Pain: Aching, Burning, Tender Pain Management and Medication Current Pain Management: Medication: Yes Rest: Yes How does your pain impact your activities of daily livingo Sleep: Yes Bathing: Yes Appetite: Yes Relationship With Others: Yes Bladder Continence: Yes Emotions: Yes Bowel Continence: Yes Work: Yes Toileting: Yes Drive: Yes Dressing: Yes Hobbies: Yes Electronic Signature(s) Signed: 09/30/2015 10:57:29 AM By: Regan Lemming [name], RN Entered By: Regan Lemming on 09/30/2015 10:57:29 [name], [name] ([id number]) -------------------------------------------------------------------------------- Patient/Caregiver Education Details Patient Name: Kristin Stare D. Date of Service: 09/30/2015 10:45 AM Medical Record Number: [id number] Patient Account Number: [id number] Date of Birth/Gender: [birth year] (68 y.o. Female) Treating RN: [name], RN, [name], [employer] Primary Care Physician: Glendon Axe Other Clinician: Referring Physician: Glendon Axe Treating Physician/Extender: [name]  Weeks in Treatment: 66 Education Assessment Education Provided To: Patient Education Topics  Provided Basic Hygiene: Methods: Explain/Verbal Responses: State content correctly Welcome To [facility]: Methods: Explain/Verbal Responses: State content correctly Electronic Signature(s) Signed: 09/30/2015 2:49:00 PM By: Regan Lemming [name], RN Entered By: Regan Lemming on 09/30/2015 14:49:00 [name], [name] ([id number]) -------------------------------------------------------------------------------- Wound Assessment Details Patient Name: Kristin Stare D. Date of Service: 09/30/2015 10:45 AM Medical Record Number: [id number] Patient Account Number: [id number] Date of Birth/Sex: [birth year] (68 y.o. Female) Treating RN: [name], RN, [name], [place] Primary Care Physician: Glendon Axe Other Clinician: Referring Physician: Glendon Axe Treating Physician/Extender: Frann Rider in Treatment: 34 Wound Status Wound Number: 2 Primary Etiology: Venous Leg Ulcer Wound Location: Left, Distal Lower Leg Wound Status: Open Wounding Event: Blister Date Acquired: 12/30/2014 Weeks Of Treatment: 34 Clustered Wound: Yes Photos Photo Uploaded By: Regan Lemming on 09/30/2015 17:02:38 Wound Measurements Length: (cm) 5 Width: (cm) 7 Depth: (cm) 0.2 Area: (cm) 27.489 Volume: (cm) 5.498 % Reduction in Area: -3788.1% % Reduction in Volume: -7643.7% Wound Description Full Thickness Without Exposed Classification: Support Structures Periwound Skin Texture Texture Color No Abnormalities Noted: No No Abnormalities Noted: No Moisture No Abnormalities Noted: No Treatment Notes Wound #2 (Left, Distal Lower Leg) Peral, Teela D. ([id number]) 4. Dressing Applied: Other dressing (specify in notes) 5. Secondary Dressing Applied Gauze and Kerlix/Conform 7. Secured with 2 Layer Compression System - Left Lower Extremity Notes Apligraf in place. Kerlix/coban compression applied Electronic Signature(s) Signed: 09/30/2015 5:35:27 PM By: Regan Lemming [name], RN Entered By: Regan Lemming on  09/30/2015 11:06:16 Lukins, Sunny D. ([id number]) -------------------------------------------------------------------------------- Wound Assessment Details Patient Name: JAMES, BURROW D. Date of Service: 09/30/2015 10:45 AM Medical Record Number: [id number] Patient Account Number: [id number] Date of Birth/Sex: Aug 12, 1946 (68 y.o. Female) Treating RN: [name], RN, [name], [place] Primary Care Physician: Glendon Axe Other Clinician: Referring Physician: Glendon Axe Treating Physician/Extender: Frann Rider in Treatment: 34 Wound Status Wound Number: 5 Primary Etiology: Venous Leg Ulcer Wound Location: Right, Posterior Lower Leg Wound Status: Open Wounding Event: Gradually Appeared Date Acquired: 04/29/2015 Weeks Of Treatment: 19 Clustered Wound: No Photos Photo Uploaded By: Regan Lemming on 09/30/2015 17:02:38 Wound Measurements Length: (cm) 5.5 Width: (cm) 7 Depth: (cm) 0.7 Area: (cm) 30.238 Volume: (cm) 21.166 % Reduction in Area: -15984% % Reduction in Volume: -111300% Wound Description Full Thickness Without Exposed Classification: Support Structures Periwound Skin Texture Texture Color Tirpak, Armelia D. ([id number]) No Abnormalities Noted: No No Abnormalities Noted: No Moisture No Abnormalities Noted: No Treatment Notes Wound #5 (Right, Posterior Lower Leg) 1. Cleansed with: Clean wound with Normal Saline 3. Peri-wound Care: Skin Prep 4. Dressing Applied: Santyl Ointment 8. Negative Pressure Wound Therapy Black Foam Number of foam/gauze pieces used in the dressing = Notes 1 black foam applied. Santly with non-adherernt dressing Electronic Signature(s) Signed: 09/30/2015 5:35:27 PM By: Regan Lemming [name], RN Entered By: Regan Lemming on 09/30/2015 11:06:17 Bayon, Betzaira D. ([id number]) -------------------------------------------------------------------------------- Vitals Details Patient Name: Kristin Stare D. Date of Service: 09/30/2015 10:45 AM Medical Record  Number: [id number] Patient Account Number: [id number] Date of Birth/Sex: 20-Oct-1946 (68 y.o. Female) Treating RN: [name], RN, [name], [place] Primary Care Physician: Glendon Axe Other Clinician: Referring Physician: Glendon Axe Treating Physician/Extender: Frann Rider in Treatment: 34 Vital Signs Time Taken: 11:00 Temperature (F): 97.5 Height (in): 65 Pulse (bpm): 86 Weight (lbs): 248 Respiratory Rate (breaths/min): 17 Body Mass Index (BMI): 41.3 Blood Pressure (mmHg): 143/64 Reference Range: 80 - 120 mg / dl Electronic Signature(s) Signed: 09/30/2015 11:00:16  AM By: Regan Lemming [name], RN Entered By: Regan Lemming on 09/30/2015 11:00:16

## 2015-10-03 DIAGNOSIS — E11622 Type 2 diabetes mellitus with other skin ulcer: Secondary | ICD-10-CM | POA: Diagnosis not present

## 2015-10-04 NOTE — Progress Notes (Signed)
CHANCIE, CHALOUPKA ([id number]) Visit Report for 10/03/2015 Arrival Information Details Patient Name: [name], [name] Date of Service: 10/03/2015 8:45 AM Medical Record Patient Account Number: [id number] Number: [name], RN, [name], Treating RN: 12/12/1945 (69 y.o. [name] Date of Birth/Sex: Female) Other Clinician: Primary Care Physician: [facility], Delana Meyer Treating Britto, Errol Referring Physician: Glendon Axe Physician/Extender: Weeks in Treatment: 82 Visit Information History Since Last Visit Added or deleted any medications: No Patient Arrived: [place] Any new allergies or adverse reactions: No Arrival Time: 08:51 Had a fall or experienced change in No Accompanied By: self activities of daily living that may affect Transfer Assistance: None risk of falls: Patient Identification Verified: Yes Signs or symptoms of abuse/neglect since last No Secondary Verification Process Yes visito Completed: Hospitalized since last visit: No Patient Has Alerts: Yes Has Dressing in Place as Prescribed: Yes Patient Alerts: Patient on Blood Pain Present Now: No Thinner Electronic Signature(s) Signed: 10/03/2015 3:36:04 PM By: Regan Lemming [name], RN Entered By: Regan Lemming on 10/03/2015 08:52:26 Cena, Kellene D. ([id number]) -------------------------------------------------------------------------------- Clinic Level of Care Assessment Details Patient Name: Kristin Stare D. Date of Service: 10/03/2015 8:45 AM Medical Record Patient Account Number: [id number] Number: [name], RN, [name], Treating RN: 1946-10-10 (69 y.o. [name] Date of Birth/Sex: Female) Other Clinician: Primary Care Physician: [facility], Delana Meyer Treating Britto, Errol Referring Physician: Glendon Axe Physician/Extender: Weeks in Treatment: 35 Clinic Level of Care Assessment Items TOOL 4 Quantity Score []  - Use when only an EandM is performed on FOLLOW-UP visit 0 ASSESSMENTS - Nursing Assessment / Reassessment X -  Reassessment of Co-morbidities (includes updates in patient status) 1 10 X - Reassessment of Adherence to Treatment Plan 1 5 ASSESSMENTS - Wound and Skin Assessment / Reassessment X - Simple Wound Assessment / Reassessment - one wound 1 5 []  - Complex Wound Assessment / Reassessment - multiple wounds 0 []  - Dermatologic / Skin Assessment (not related to wound area) 0 ASSESSMENTS - Focused Assessment []  - Circumferential Edema Measurements - multi extremities 0 []  - Nutritional Assessment / Counseling / Intervention 0 []  - Lower Extremity Assessment (monofilament, tuning fork, pulses) 0 []  - Peripheral Arterial Disease Assessment (using hand held doppler) 0 ASSESSMENTS - Ostomy and/or Continence Assessment and Care []  - Incontinence Assessment and Management 0 []  - Ostomy Care Assessment and Management (repouching, etc.) 0 PROCESS - Coordination of Care []  - Simple Patient / Family Education for ongoing care 0 []  - Complex (extensive) Patient / Family Education for ongoing care 0 []  - Staff obtains Programmer, systems, Records, Test Results / Process Orders 0 []  - Staff telephones HHA, Nursing Homes / Clarify orders / etc 0 Kopinski, Kathryn D. ([id number]) []  - Routine Transfer to another Facility (non-emergent condition) 0 []  - Routine Hospital Admission (non-emergent condition) 0 []  - New Admissions / Biomedical engineer / Ordering NPWT, Apligraf, etc. 0 []  - Emergency Hospital Admission (emergent condition) 0 []  - Simple Discharge Coordination 0 []  - Complex (extensive) Discharge Coordination 0 PROCESS - Special Needs []  - Pediatric / Minor Patient Management 0 []  - Isolation Patient Management 0 []  - Hearing / Language / Visual special needs 0 []  - Assessment of Community assistance (transportation, D/C planning, etc.) 0 []  - Additional assistance / Altered mentation 0 []  - Support Surface(s) Assessment (bed, cushion, seat, etc.) 0 INTERVENTIONS - Wound Cleansing / Measurement X - Simple  Wound Cleansing - one wound 1 5 []  - Complex Wound Cleansing - multiple wounds 0 []  - Wound Imaging (photographs - any number of wounds)  0 []  - Wound Tracing (instead of photographs) 0 []  - Simple Wound Measurement - one wound 0 []  - Complex Wound Measurement - multiple wounds 0 INTERVENTIONS - Wound Dressings []  - Small Wound Dressing one or multiple wounds 0 []  - Medium Wound Dressing one or multiple wounds 0 []  - Large Wound Dressing one or multiple wounds 0 X - Application of Medications - topical 1 5 []  - Application of Medications - injection 0 Perotti, Terrill D. ([id number]) INTERVENTIONS - Miscellaneous []  - External ear exam 0 []  - Specimen Collection (cultures, biopsies, blood, body fluids, etc.) 0 []  - Specimen(s) / Culture(s) sent or taken to Lab for analysis 0 []  - Patient Transfer (multiple staff / [name]) 0 []  - Simple Staple / Suture removal (25 or less) 0 []  - Complex Staple / Suture removal (26 or more) 0 []  - Hypo / Hyperglycemic Management (close monitor of Blood Glucose) 0 []  - Ankle / Brachial Index (ABI) - do not check if billed separately 0 []  - Vital Signs 0 Has the patient been seen at the hospital within the last three years: Yes Total Score: 30 Level Of Care: New/Established - Level 1 Electronic Signature(s) Signed: 10/03/2015 3:36:04 PM By: Regan Lemming [name], RN Entered By: Regan Lemming on 10/03/2015 09:21:03 Torbeck, Yulianna D. ([id number]) -------------------------------------------------------------------------------- Encounter Discharge Information Details Patient Name: TORUNN, [name] Date of Service: 10/03/2015 8:45 AM Medical Record Patient Account Number: [id number] Number: [name], RN, [name], Treating RN: 05-Nov-1945 (69 y.o. [name] Date of Birth/Sex: Female) Other Clinician: Primary Care Physician: [facility], Delana Meyer Treating [name] Referring Physician: Brush, [place]: Weeks in Treatment: 35 Encounter  Discharge Information Items Discharge Pain Level: 0 Discharge Condition: Stable Ambulatory Status: Cane Discharge Destination: Home Private Transportation: Auto Accompanied By: self Schedule Follow-up Appointment: No Medication Reconciliation completed and No provided to Patient/Care Richie Vadala: Clinical Summary of Care: Electronic Signature(s) Signed: 10/03/2015 3:36:04 PM By: Regan Lemming [name], RN Entered By: Regan Lemming on 10/03/2015 09:20:07 Jacquet, Tia Masker ([id number]) -------------------------------------------------------------------------------- Patient/Caregiver Education Details Patient Name: Kristin Stare D. Date of Service: 10/03/2015 8:45 AM Medical Record Patient Account Number: [id number] Number: [name], RN, [name], Treating RN: 1945/12/08 (69 y.o. [name] Date of Birth/Gender: Female) Other Clinician: Primary Care Physician: [facility], Delana Meyer Treating [name] Referring Physician: Concepcion, [place]: [place] in Treatment: 35 Education Assessment Education Provided To: Patient Education Topics Provided Welcome To [facility]: Methods: Explain/Verbal Responses: State content correctly Electronic Signature(s) Signed: 10/03/2015 3:36:04 PM By: Regan Lemming [name], RN Entered By: Regan Lemming on 10/03/2015 08:53:17

## 2015-10-06 DIAGNOSIS — E11622 Type 2 diabetes mellitus with other skin ulcer: Secondary | ICD-10-CM | POA: Diagnosis not present

## 2015-10-08 DIAGNOSIS — E11622 Type 2 diabetes mellitus with other skin ulcer: Secondary | ICD-10-CM | POA: Diagnosis not present

## 2015-10-08 NOTE — Progress Notes (Signed)
DAMEISHA, HIRTH ([id number]) Visit Report for 10/06/2015 Arrival Information Details Patient Name: ALYENA, ROUDABUSH. Date of Service: 10/06/2015 9:30 AM Medical Record Number: [id number] Patient Account Number: [id number] Date of Birth/Sex: Feb 05, 1946 (68 y.o. Female) Treating RN: Carolyne Fiscal, Debi Primary Care Physician: Glendon Axe Other Clinician: Referring Physician: Glendon Axe Treating Physician/Extender: Frann Rider in Treatment: 65 Visit Information History Since Last Visit All ordered tests and consults were completed: No Patient Arrived: Ambulatory Added or deleted any medications: No Arrival Time: 10:00 Any new allergies or adverse reactions: No Accompanied By: graddughter Had a fall or experienced change in No Transfer Assistance: None activities of daily living that may affect Patient Identification Verified: Yes risk of falls: Secondary Verification Process Yes Signs or symptoms of abuse/neglect since last No Completed: visito Patient Requires Transmission- No Hospitalized since last visit: No Based Precautions: Pain Present Now: Yes Patient Has Alerts: Yes Patient Alerts: Patient on Blood Thinner Electronic Signature(s) Signed: 10/07/2015 3:59:50 PM By: Alric Quan Entered By: Alric Quan on 10/06/2015 10:01:27 Slifer, Shakthi D. ([id number]) -------------------------------------------------------------------------------- Encounter Discharge Information Details Patient Name: ODDIE, HWANG D. Date of Service: 10/06/2015 9:30 AM Medical Record Number: [id number] Patient Account Number: [id number] Date of Birth/Sex: 05/16/1946 (68 y.o. Female) Treating RN: Carolyne Fiscal, Debi Primary Care Physician: Glendon Axe Other Clinician: Referring Physician: Glendon Axe Treating Physician/Extender: Frann Rider in Treatment: 25 Encounter Discharge Information Items Discharge Pain Level: 0 Discharge Condition: Stable Ambulatory Status:  Ambulatory Discharge Destination: Home Transportation: Private Auto Accompanied By: granddaughter Schedule Follow-up Appointment: Yes Medication Reconciliation completed Yes and provided to Patient/Care Timonthy Hovater: Clinical Summary of Care: Electronic Signature(s) Signed: 10/07/2015 3:59:50 PM By: Alric Quan Entered By: Alric Quan on 10/06/2015 12:12:00 Pina, Rotunda D. ([id number]) -------------------------------------------------------------------------------- Lower Extremity Assessment Details Patient Name: [name] Date of Service: 10/06/2015 9:30 AM Medical Record Number: [id number] Patient Account Number: [id number] Date of Birth/Sex: [birth year] (68 y.o. Female) Treating RN: Carolyne Fiscal, Debi Primary Care Physician: Glendon Axe Other Clinician: Referring Physician: Glendon Axe Treating Physician/Extender: Frann Rider in Treatment: 35 Edema Assessment Assessed: [Left: No] [Right: No] Edema: [Left: Yes] [Right: Yes] Calf Left: Right: Point of Measurement: cm From Medial Instep 35.5 cm 40 cm Ankle Left: Right: Point of Measurement: cm From Medial Instep 23.5 cm 24 cm Vascular Assessment Pulses: Posterior Tibial Dorsalis Pedis Palpable: [Left:Yes] [Right:Yes] Extremity colors, hair growth, and conditions: Extremity Color: [Left:Normal] [Right:Normal] Hair Growth on Extremity: [Left:Yes] [Right:Yes] Temperature of Extremity: [Left:Warm] [Right:Warm] Capillary Refill: [Left:< 3 seconds] [Right:< 3 seconds] Toe Nail Assessment Left: Right: Thick: No No Discolored: Yes Yes Deformed: No No Improper Length and Hygiene: No No Electronic Signature(s) Signed: 10/07/2015 3:59:50 PM By: Alric Quan Entered By: Alric Quan on 10/06/2015 10:30:16 Trostle, Safaa D. ([id number]) -------------------------------------------------------------------------------- Multi Wound Chart Details Patient Name: [name] Date of Service: 10/06/2015  9:30 AM Medical Record Number: [id number] Patient Account Number: [id number] Date of Birth/Sex: 17-Aug-1946 (68 y.o. Female) Treating RN: Carolyne Fiscal, Debi Primary Care Physician: Glendon Axe Other Clinician: Referring Physician: Glendon Axe Treating Physician/Extender: Frann Rider in Treatment: 35 Vital Signs Height(in): 65 Pulse(bpm): 80 Weight(lbs): 248 Blood Pressure 128/58 (mmHg): Body Mass Index(BMI): 41 Temperature(F): 97.4 Respiratory Rate 18 (breaths/min): Photos: [2:No Photos] [5:No Photos] [N/A:N/A] Wound Location: [2:Left Lower Leg - Distal Right Lower Leg -] [5:Posterior] [N/A:N/A] Wounding Event: [2:Blister] [5:Gradually Appeared] [N/A:N/A] Primary Etiology: [2:Venous Leg Ulcer] [5:Venous Leg Ulcer] [N/A:N/A] Comorbid History: [2:Cataracts, Asthma, Coronary Artery Disease, Coronary Artery Disease, Hypertension, Type II Diabetes, Osteoarthritis, Diabetes, Osteoarthritis, Neuropathy] [5:Cataracts, Asthma, Hypertension,  Type II Neuropathy] [N/A:N/A] Date Acquired: [2:12/30/2014] [5:04/29/2015] [N/A:N/A] Weeks of Treatment: [2:35] [5:19] [N/A:N/A] Wound Status: [2:Open] [5:Open] [N/A:N/A] Clustered Wound: [2:Yes] [5:No] [N/A:N/A] Measurements L x W x D 5.3x4.3x0.2 [5:5.5x6.9x0.7] [N/A:N/A] (cm) Area (cm) : [2:17.899] [5:29.806] [N/A:N/A] Volume (cm) : [2:3.58] [5:20.864] [N/A:N/A] % Reduction in Area: [2:-2431.70%] [5:-15754.30%] [N/A:N/A] % Reduction in Volume: -4942.30% [5:-109710.50%] [N/A:N/A] Classification: [2:Full Thickness Without Exposed Support Structures] [5:Full Thickness Without Exposed Support Structures] [N/A:N/A] HBO Classification: [2:Grade 2] [5:Grade 2] [N/A:N/A] Exudate Amount: [2:Large] [5:Large] [N/A:N/A] Exudate Type: [2:Serous] [5:Serosanguineous] [N/A:N/A] Exudate Color: [2:amber] [5:red, brown] [N/A:N/A] Wound Margin: [2:Thickened] [5:Thickened] [N/A:N/A] Granulation Amount: [2:Medium (34-66%)] [5:Medium (34-66%)]  [N/A:N/A] Granulation Quality: [2:Red, Pink] [5:Red, Pink] [N/A:N/A] Necrotic Amount: Medium (34-66%) Medium (34-66%) N/A Exposed Structures: Fascia: No N/A N/A Fat: No Tendon: No Muscle: No Joint: No Bone: No Limited to Skin Breakdown Epithelialization: None None N/A Periwound Skin Texture: Edema: Yes Edema: Yes N/A Scarring: Yes Scarring: Yes Periwound Skin Maceration: Yes Moist: Yes N/A Moisture: Moist: Yes Periwound Skin Color: No Abnormalities Noted No Abnormalities Noted N/A Temperature: No Abnormality No Abnormality N/A Tenderness on Yes Yes N/A Palpation: Wound Preparation: Ulcer Cleansing: Ulcer Cleansing: N/A Rinsed/Irrigated with Rinsed/Irrigated with Saline Saline Topical Anesthetic Topical Anesthetic Applied: Other: lidocaine Applied: Other: lidocaine 4% 4% Treatment Notes Electronic Signature(s) Signed: 10/07/2015 3:59:50 PM By: Alric Quan Entered By: Alric Quan on 10/06/2015 10:34:48 Mccutcheon, Margreat D. ([id number]) -------------------------------------------------------------------------------- [place] Details Patient Name: SABRIE, MENZIES D. Date of Service: 10/06/2015 9:30 AM Medical Record Number: [id number] Patient Account Number: [id number] Date of Birth/Sex: 1946/01/01 (68 y.o. Female) Treating RN: Carolyne Fiscal, Debi Primary Care Physician: Glendon Axe Other Clinician: Referring Physician: Glendon Axe Treating Physician/Extender: Frann Rider in Treatment: 101 Active Inactive Orientation to the Wound Care Program Nursing Diagnoses: Knowledge deficit related to the wound healing center program Goals: Patient/caregiver will verbalize understanding of the [place] Program Date Initiated: 01/31/2015 Goal Status: Active Interventions: Provide education on orientation to the wound center Notes: Venous Leg Ulcer Nursing Diagnoses: Potential for venous Insuffiency (use before diagnosis  confirmed) Goals: Non-invasive venous studies are completed as ordered Date Initiated: 01/31/2015 Goal Status: Active Patient/caregiver will verbalize understanding of disease process and disease management Date Initiated: 01/31/2015 Goal Status: Active Interventions: Assess peripheral edema status every visit. Notes: Wound/Skin Impairment Nursing Diagnoses: Impaired tissue integrity Knowledge deficit related to smoking impact on wound healing Menter, Jeffery D. ([id number]) Goals: Patient/caregiver will verbalize understanding of skin care regimen Date Initiated: 01/31/2015 Goal Status: Active Ulcer/skin breakdown will heal within 14 weeks Date Initiated: 01/31/2015 Goal Status: Active Interventions: Assess ulceration(s) every visit Notes: Electronic Signature(s) Signed: 10/07/2015 3:59:50 PM By: Alric Quan Entered By: Alric Quan on 10/06/2015 10:34:38 Marteney, Margrete D. ([id number]) -------------------------------------------------------------------------------- Pain Assessment Details Patient Name: [name] Date of Service: 10/06/2015 9:30 AM Medical Record Number: [id number] Patient Account Number: [id number] Date of Birth/Sex: [birth year] (68 y.o. Female) Treating RN: Carolyne Fiscal, Debi Primary Care Physician: Glendon Axe Other Clinician: Referring Physician: Glendon Axe Treating Physician/Extender: Frann Rider in Treatment: 35 Active Problems Location of Pain Severity and Description of Pain Patient Has Paino Yes Site Locations Pain Location: Pain in Ulcers With Dressing Change: Yes Duration of the Pain. Constant / Intermittento Constant Rate the pain. Current Pain Level: 5 Worst Pain Level: 10 Least Pain Level: 0 Character of Pain Describe the Pain: Sharp, Stabbing, Throbbing Pain Management and Medication Current Pain Management: Electronic Signature(s) Signed: 10/07/2015 3:59:50 PM By: Alric Quan Entered By: Alric Quan on  10/06/2015 10:02:09  ANNITRA, REEG ([id number]) -------------------------------------------------------------------------------- Patient/Caregiver Education Details Patient Name: EILY, BOLLIER D. Date of Service: 10/06/2015 9:30 AM Medical Record Number: [id number] Patient Account Number: [id number] Date of Birth/Gender: 1946/04/12 (68 y.o. Female) Treating RN: Carolyne Fiscal, Debi Primary Care Physician: Glendon Axe Other Clinician: Referring Physician: Glendon Axe Treating Physician/Extender: Frann Rider in Treatment: 93 Education Assessment Education Provided To: Patient Education Topics Provided Wound/Skin Impairment: Handouts: Other: do not get wrap wet Methods: Demonstration, Explain/Verbal Responses: State content correctly Electronic Signature(s) Signed: 10/07/2015 3:59:50 PM By: Alric Quan Entered By: Alric Quan on 10/06/2015 12:11:36 Tupou, Desia D. ([id number]) -------------------------------------------------------------------------------- Wound Assessment Details Patient Name: Dingley, Gilberto D. Date of Service: 10/06/2015 9:30 AM Medical Record Number: [id number] Patient Account Number: [id number] Date of Birth/Sex: [birth year] (68 y.o. Female) Treating RN: Carolyne Fiscal, Debi Primary Care Physician: Glendon Axe Other Clinician: Referring Physician: Glendon Axe Treating Physician/Extender: Frann Rider in Treatment: 35 Wound Status Wound Number: 2 Primary Venous Leg Ulcer Etiology: Wound Location: Left Lower Leg - Distal Wound Open Wounding Event: Blister Status: Date Acquired: 12/30/2014 Comorbid Cataracts, Asthma, Coronary Artery Weeks Of Treatment: 35 History: Disease, Hypertension, Type II Clustered Wound: Yes Diabetes, Osteoarthritis, Neuropathy Photos Photo Uploaded By: Alric Quan on 10/06/2015 17:54:34 Wound Measurements Length: (cm) 5.3 Width: (cm) 4.3 Depth: (cm) 0.2 Area: (cm) 17.899 Volume: (cm) 3.58 %  Reduction in Area: -2431.7% % Reduction in Volume: -4942.3% Epithelialization: None Tunneling: No Undermining: No Wound Description Full Thickness Without Classification: Exposed Support Structures Diabetic Severity Grade 2 (Wagner): Wound Margin: Thickened Exudate Amount: Large Exudate Type: Serous Exudate Color: amber Foul Odor After Cleansing: No Wound Bed Granulation Amount: Medium (34-66%) Exposed Structure Demo, Lissete D. ([id number]) Granulation Quality: Red, Pink Fascia Exposed: No Necrotic Amount: Medium (34-66%) Fat Layer Exposed: No Necrotic Quality: Adherent Slough Tendon Exposed: No Muscle Exposed: No Joint Exposed: No Bone Exposed: No Limited to Skin Breakdown Periwound Skin Texture Texture Color No Abnormalities Noted: No No Abnormalities Noted: No Localized Edema: Yes Temperature / Pain Scarring: Yes Temperature: No Abnormality Moisture Tenderness on Palpation: Yes No Abnormalities Noted: No Maceration: Yes Moist: Yes Wound Preparation Ulcer Cleansing: Rinsed/Irrigated with Saline Topical Anesthetic Applied: Other: lidocaine 4%, Treatment Notes Wound #2 (Left, Distal Lower Leg) 1. Cleansed with: Cleanse wound with antibacterial soap and water 3. Peri-wound Care: Skin Prep 4. Dressing Applied: Hydrogel Santyl Ointment Other dressing (specify in notes) 5. Secondary Dressing Applied ABD Pad 7. Secured with 2 Layer Compression System - Left Lower Extremity 8. Negative Pressure Wound Therapy Wound Vac to wound continuously at 173mm/hg pressure Black Foam Notes sorbact,, kerlix, coban Electronic Signature(s) Signed: 10/07/2015 3:59:50 PM By: Alric Quan Entered By: Alric Quan on 10/06/2015 10:22:04 Broz, Hazelynn D. ([id number]) Rozo, Amanpreet D. ([id number]) -------------------------------------------------------------------------------- Wound Assessment Details Patient Name: Tipps, Latoy D. Date of Service: 10/06/2015 9:30  AM Medical Record Number: [id number] Patient Account Number: [id number] Date of Birth/Sex: [birth year] (68 y.o. Female) Treating RN: Carolyne Fiscal, Debi Primary Care Physician: Glendon Axe Other Clinician: Referring Physician: Glendon Axe Treating Physician/Extender: Frann Rider in Treatment: 35 Wound Status Wound Number: 5 Primary Venous Leg Ulcer Etiology: Wound Location: Right Lower Leg - Posterior Wound Open Wounding Event: Gradually Appeared Status: Date Acquired: 04/29/2015 Comorbid Cataracts, Asthma, Coronary Artery Weeks Of Treatment: 19 History: Disease, Hypertension, Type II Clustered Wound: No Diabetes, Osteoarthritis, Neuropathy Photos Photo Uploaded By: Alric Quan on 10/06/2015 17:54:34 Wound Measurements Length: (cm) 5.5 Width: (cm) 6.9 Depth: (cm) 0.7 Area: (cm) 29.806 Volume: (cm) 20.864 % Reduction in Area: -15754.3% % Reduction  in Volume: -109710.5% Epithelialization: None Tunneling: No Undermining: No Wound Description Full Thickness Without Exposed Foul Odor A Classification: Support Structures Diabetic Severity Grade 2 (Wagner): Wound Margin: Thickened Exudate Amount: Large Exudate Type: Serosanguineous Exudate Color: red, brown fter Cleansing: No Wound Bed Granulation Amount: Medium (34-66%) Vansickle, Bettyanne D. ([id number]) Granulation Quality: Red, Pink Necrotic Amount: Medium (34-66%) Necrotic Quality: Adherent Slough Periwound Skin Texture Texture Color No Abnormalities Noted: No No Abnormalities Noted: No Localized Edema: Yes Temperature / Pain Scarring: Yes Temperature: No Abnormality Moisture Tenderness on Palpation: Yes No Abnormalities Noted: No Moist: Yes Wound Preparation Ulcer Cleansing: Rinsed/Irrigated with Saline Topical Anesthetic Applied: Other: lidocaine 4%, Treatment Notes Wound #5 (Right, Posterior Lower Leg) 1. Cleansed with: Cleanse wound with antibacterial soap and water 3. Peri-wound  Care: Skin Prep 4. Dressing Applied: Hydrogel Santyl Ointment Other dressing (specify in notes) 5. Secondary Dressing Applied ABD Pad 7. Secured with 2 Layer Compression System - Left Lower Extremity 8. Negative Pressure Wound Therapy Wound Vac to wound continuously at 137mm/hg pressure Black Foam Notes sorbact,, kerlix, coban Electronic Signature(s) Signed: 10/07/2015 3:59:50 PM By: Alric Quan Entered By: Alric Quan on 10/06/2015 10:32:43 Ullman, Lamanda D. ([id number]) -------------------------------------------------------------------------------- [place] Details Patient Name: [name] Date of Service: 10/06/2015 9:30 AM Medical Record Number: [id number] Patient Account Number: [id number] Date of Birth/Sex: 23-Aug-1946 (68 y.o. Female) Treating RN: Carolyne Fiscal, Debi Primary Care Physician: Glendon Axe Other Clinician: Referring Physician: Glendon Axe Treating Physician/Extender: Frann Rider in Treatment: 35 Vital Signs Time Taken: 10:02 Temperature (F): 97.4 Height (in): 65 Pulse (bpm): 80 Weight (lbs): 248 Respiratory Rate (breaths/min): 18 Body Mass Index (BMI): 41.3 Blood Pressure (mmHg): 128/58 Reference Range: 80 - 120 mg / dl Electronic Signature(s) Signed: 10/07/2015 3:59:50 PM By: Alric Quan Entered By: Alric Quan on 10/06/2015 10:04:45

## 2015-10-08 NOTE — Progress Notes (Signed)
Kristin, [name] ([id number]) Visit Report for 10/06/2015 Physician Orders Details Patient Name: [name], [name] 10/06/2015 9:30 Date of Service: AM Medical Record [id number] Number: Patient Account Number: [id number] [birth year] (69 y.o. Treating RN: Ahmed Prima Date of Birth/Sex: Female) Other Clinician: Primary Care Physician: [facility], [name] Treating [name] Referring Physician: Glendon Axe Physician/Extender: Weeks in Treatment: 79 Verbal / Phone Orders: Yes Clinician: Pinkerton, Debi Read Back and Verified: Yes Diagnosis Coding Wound Cleansing Wound #2 Left,Distal Lower Leg o Clean wound with Normal Saline. Wound #5 Right,Posterior Lower Leg o Clean wound with Normal Saline. Skin Barriers/Peri-Wound Care Wound #2 Left,Distal Lower Leg o Skin Prep Wound #5 Right,Posterior Lower Leg o Skin Prep Primary Wound Dressing Wound #2 Left,Distal Lower Leg o Other: - sorbact with hydrogel Wound #5 Right,Posterior Lower Leg o Santyl Ointment - NPWT applied Secondary Dressing Wound #2 Left,Distal Lower Leg o ABD pad o Dry Gauze Wound #5 Right,Posterior Lower Leg o Non-adherent pad Dressing Change Frequency Wound #2 Left,Distal Lower Leg [place]se[place]ck, Dillan D. ([id number]) o Change dressing every week - At [facility] Wound #5 Right,Posterior Lower Leg o Change Dressing Monday, Wednesday, Friday - at [facility] Follow-up Appointments Wound #2 Left,Distal Lower Leg o Return Appointment in 1 week. Wound #5 Right,Posterior Lower Leg o Return Appointment in 1 week. Edema Control Wound #2 Left,Distal Lower Leg o 2 Layer Compression System - Left Lower Extremity - kerlix and coban o Elevate legs to the level of the heart and pump ankles as often as possible Wound #5 Right,Posterior Lower Leg o Elevate legs to the level of the heart and pump ankles as often as possible Negative Pressure Wound Therapy Wound #5 Right,Posterior Lower Leg o Wound VAC  settings at 125/130 mmHg continuous pressure. Use BLACK/GREEN foam to wound cavity. Use WHITE foam to fill any tunnel/s and/or undermining. Change VAC dressing 3 X WEEK. Change canister as indicated when full. Nurse may titrate settings and frequency of dressing changes as clinically indicated. o Number of foam/gauze pieces used in the dressing = - i piece of black foam applied Electronic Signature(s) Signed: 10/06/2015 4:36:00 PM By: [name], [name] Signed: 10/07/2015 3:59:50 PM By: Alric Quan Entered By: Alric Quan on 10/06/2015 10:48:18 Puller, Tykiera D. ([id number]) -------------------------------------------------------------------------------- [place] Details Patient Name: Kristin, [name] Date of Service: 10/06/2015 Medical Record Number: [id number] Patient Account Number: [id number] Date of Birth/Sex: 06-01-1946 (68 y.o. 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

## 2015-10-09 NOTE — Progress Notes (Signed)
NOUR, FEINMAN ([id number]) Visit Report for 10/08/2015 Arrival Information Details Patient Name: [name], [name] 10/08/2015 10:00 Date of Service: AM Medical Record [id number] Number: Patient Account Number: [id number] [birth year] (69 y.o. Treating RN: Baruch Gouty, RN, [name], [name] Date of Birth/Sex: Female) Other Clinician: Primary Care Treating Celina, Corunna III, WALTER Physician: Physician/Extender: Referring Physician: Glendon Axe Weeks in Treatment: 35 Visit Information History Since Last Visit Any new allergies or adverse reactions: No Patient Arrived: Ambulatory Had a fall or experienced change in No Arrival Time: 10:04 activities of daily living that may affect Accompanied By: grddtr risk of falls: Transfer Assistance: None Hospitalized since last visit: No Patient Identification Verified: Yes Has Dressing in Place as Prescribed: Yes Secondary Verification Process Yes Pain Present Now: No Completed: Patient Requires Transmission- No Based Precautions: Patient Has Alerts: Yes Patient Alerts: Patient on Blood Thinner Electronic Signature(s) Signed: 10/08/2015 5:16:39 PM By: Regan Lemming [name], RN Entered By: Regan Lemming on 10/08/2015 10:04:54 Muench, Tia Masker ([id number]) -------------------------------------------------------------------------------- Encounter Discharge Information Details Patient Name: [name], [name] 10/08/2015 10:00 Date of Service: AM Medical Record [id number] Number: Patient Account Number: [id number] August 13, 1946 (69 y.o. Treating RN: Baruch Gouty, RN, [name], [name] Date of Birth/Sex: Female) Other Clinician: Primary Care Treating [place], [name] BURNS III, WALTER Physician: Physician/Extender: Referring Physician: Candiss Norse, [name] Weeks in Treatment: 14 Encounter Discharge Information Items Discharge Pain Level: 0 Discharge Condition: Stable Ambulatory Status: Ambulatory Discharge Destination: Home Private Transportation: Auto Accompanied  By: grddtr Schedule Follow-up Appointment: No Medication Reconciliation completed and No provided to Patient/Care Toran Murch: Clinical Summary of Care: Electronic Signature(s) Signed: 10/08/2015 5:16:39 PM By: Regan Lemming [name], RN Entered By: Regan Lemming on 10/08/2015 10:27:05 Strahm, Tia Masker ([id number]) -------------------------------------------------------------------------------- Patient/Caregiver Education Details Patient Name: [name], [name] 10/08/2015 10:00 Date of Service: AM Medical Record [id number] Number: Patient Account Number: [id number] 1946-08-13 (69 y.o. Treating RN: Baruch Gouty, RN, [name], [name] Date of Birth/Gender: Female) Other Clinician: Primary Care Treating [place], Ahtanum, WALTER Physician: Physician/Extender: Referring Physician: Glendon Axe Weeks in Treatment: 21 Education Assessment Education Provided To: Patient Education Topics Provided Welcome To [facility] [place]: Methods: Explain/Verbal Responses: State content correctly Electronic Signature(s) Signed: 10/08/2015 5:16:39 PM By: Regan Lemming [name], RN Entered By: Regan Lemming on 10/08/2015 10:26:48

## 2015-10-10 ENCOUNTER — Encounter: Payer: PPO | Admitting: Surgery

## 2015-10-10 DIAGNOSIS — E11622 Type 2 diabetes mellitus with other skin ulcer: Secondary | ICD-10-CM | POA: Diagnosis not present

## 2015-10-11 NOTE — Progress Notes (Signed)
TAQUILLA, ECHEVERRY ([id number]) Visit Report for 10/10/2015 Arrival Information Details Patient Name: [name], [name] 10/10/2015 10:45 Date of Service: AM Medical Record [id number] Number: Patient Account Number: [id number] [birth year] (69 y.o. Treating RN: Baruch Gouty, RN, [name], [name] Date of Birth/Sex: Female) Other Clinician: Primary Care Physician: [facility], Delana Meyer Treating [name] Referring Physician: [place], [place]: Weeks in Treatment: 79 Visit Information History Since Last Visit Any new allergies or adverse reactions: No Patient Arrived: Cane Signs or symptoms of abuse/neglect since last No Arrival Time: 10:43 visito Accompanied By: self Hospitalized since last visit: No Transfer Assistance: None Has Dressing in Place as Prescribed: Yes Patient Identification Verified: Yes Pain Present Now: No Secondary Verification Process Yes Completed: Patient Requires Transmission- No Based Precautions: Patient Has Alerts: Yes Patient Alerts: Patient on Blood Thinner Electronic Signature(s) Signed: 10/10/2015 3:55:28 PM By: Regan Lemming [name], RN Entered By: Regan Lemming on 10/10/2015 10:44:12 [place], Tia Masker ([id number]) -------------------------------------------------------------------------------- Encounter Discharge Information Details Patient Name: [name], [name] 10/10/2015 10:45 Date of Service: AM Medical Record [id number] Number: Patient Account Number: [id number] May 01, 1946 (69 y.o. Treating RN: Baruch Gouty, RN, [name], [name] Date of Birth/Sex: Female) Other Clinician: Primary Care Physician: [facility], [name] Treating [name] Referring Physician: [place], [place]: Weeks in Treatment: 54 Encounter Discharge Information Items Discharge Pain Level: 0 Discharge Condition: Stable Ambulatory Status: Cane Discharge Destination: Home Transportation: Private Auto Accompanied By: grd dtr Schedule Follow-up Appointment: No Medication  Reconciliation completed and provided to Patient/Care No Rilyn Scroggs: Provided on Clinical Summary of Care: 10/10/2015 Form Type Recipient Paper Patient LM Electronic Signature(s) Signed: 10/10/2015 12:22:13 PM By: Regan Lemming [name], RN Previous Signature: 10/10/2015 12:09:21 PM Version By: Ruthine Dose Entered By: Regan Lemming on 10/10/2015 12:22:13 Weisenberger, Tia Masker ([id number]) -------------------------------------------------------------------------------- Lower Extremity Assessment Details Patient Name: MADDEN, PELZEL D. 10/10/2015 10:45 Date of Service: AM Medical Record [id number] Number: Patient Account Number: [id number] July 14, 1946 (69 y.o. Treating RN: Baruch Gouty, RN, [name], [name] Date of Birth/Sex: Female) Other Clinician: Primary Care Physician: [facility], Delana Meyer Treating Britto, Errol Referring Physician: Glendon Axe Physician/Extender: Weeks in Treatment: 36 Vascular Assessment Pulses: Posterior Tibial Dorsalis Pedis Palpable: [Left:Yes] [Right:Yes] Extremity colors, hair growth, and conditions: Extremity Color: [Left:Mottled] [Right:Mottled] Hair Growth on Extremity: [Left:No] [Right:No] Temperature of Extremity: [Left:Warm] [Right:Warm] Capillary Refill: [Left:< 3 seconds] [Right:< 3 seconds] Toe Nail Assessment Left: Right: Thick: No No Discolored: No No Deformed: No No Improper Length and Hygiene: No No Electronic Signature(s) Signed: 10/10/2015 3:55:28 PM By: Regan Lemming [name], RN Entered By: Regan Lemming on 10/10/2015 10:45:03 Edge, Abeera DMarland Kitchen ([id number]) -------------------------------------------------------------------------------- Multi Wound Chart Details Patient Name: Kristin Stare D. 10/10/2015 10:45 Date of Service: AM Medical Record [id number] Number: Patient Account Number: [id number] Feb 22, 1946 (69 y.o. Treating RN: Baruch Gouty, RN, [name], [name] Date of Birth/Sex: Female) Other Clinician: Primary Care Physician: [facility], Delana Meyer Treating Britto,  Errol Referring Physician: Glendon Axe Physician/Extender: Weeks in Treatment: 36 Vital Signs Height(in): 65 Pulse(bpm): 76 Weight(lbs): 248 Blood Pressure 143/56 (mmHg): Body Mass Index(BMI): 41 Temperature(F): 98.2 Respiratory Rate 18 (breaths/min): Photos: [2:No Photos] [5:No Photos] [6:No Photos] Wound Location: [2:Left Lower Leg - Distal] [5:Right Lower Leg - Posterior] [6:Right Lower Leg - Medial] Wounding Event: [2:Blister] [5:Gradually Appeared] [6:Gradually Appeared] Primary Etiology: [2:Venous Leg Ulcer] [5:Venous Leg Ulcer] [6:Diabetic Wound/Ulcer of the Lower Extremity] Comorbid History: [2:Cataracts, Asthma, Coronary Artery Disease, Coronary Artery Disease, Coronary Artery Disease, Hypertension, Type II Diabetes, Osteoarthritis, Diabetes, Osteoarthritis, Diabetes, Osteoarthritis, Neuropathy] [5:Cataracts, Asthma,  Hypertension, Type II Neuropathy] [6:Cataracts, Asthma, Hypertension, Type II Neuropathy] Date Acquired: [2:12/30/2014] [5:04/29/2015] [6:10/10/2015] Weeks  of Treatment: [2:36] [5:20] [6:0] Wound Status: [2:Open] [5:Open] [6:Open] Clustered Wound: [2:Yes] [5:No] [6:No] Measurements L x W x D 3x4.5x0.2 [5:5.5x8x0.7] [6:2.2x2x0.1] (cm) Area (cm) : [2:10.603] [5:34.558] [6:3.456] Volume (cm) : [2:2.121] [5:24.19] [6:0.346] % Reduction in Area: [2:-1399.70%] [5:-18281.90%] [6:N/A] % Reduction in Volume: -2887.30% [5:-127215.80%] [6:N/A] Classification: [2:Full Thickness Without Exposed Support Structures] [5:Full Thickness Without Exposed Support Structures] [6:Unable to visualize wound bed] HBO Classification: [2:Grade 2] [5:Grade 2] [6:N/A] Exudate Amount: [2:Large] [5:Large] [6:Medium] Exudate Type: [2:Serous] [5:Serosanguineous] [6:Serosanguineous] Exudate Color: [2:amber] [5:red, brown] [6:red, brown] Wound Margin: Thickened Thickened Distinct, outline attached Granulation Amount: Medium (34-66%) Medium (34-66%) Small (1-33%) Granulation Quality:  Red, Pink Red, Pink Pink, Pale Necrotic Amount: Medium (34-66%) Medium (34-66%) Large (67-100%) Necrotic Tissue: Doland Exposed Structures: Fascia: No N/A Fascia: No Fat: No Fat: No Tendon: No Tendon: No Muscle: No Muscle: No Joint: No Joint: No Bone: No Bone: No Limited to Skin Limited to Skin Breakdown Breakdown Epithelialization: None None None Periwound Skin Texture: Edema: Yes Edema: Yes Edema: No Scarring: Yes Scarring: Yes Excoriation: No Induration: No Callus: No Crepitus: No Fluctuance: No Friable: No Rash: No Scarring: No Periwound Skin Maceration: Yes Moist: Yes Moist: Yes Moisture: Moist: Yes Maceration: No Dry/Scaly: No Periwound Skin Color: No Abnormalities Noted No Abnormalities Noted Atrophie Blanche: No Cyanosis: No Ecchymosis: No Erythema: No Hemosiderin Staining: No Mottled: No Pallor: No Rubor: No Temperature: No Abnormality No Abnormality No Abnormality Tenderness on Yes Yes Yes Palpation: Wound Preparation: Ulcer Cleansing: Ulcer Cleansing: Ulcer Cleansing: Rinsed/Irrigated with Rinsed/Irrigated with Rinsed/Irrigated with Saline Saline Saline Topical Anesthetic Topical Anesthetic Topical Anesthetic Applied: Other: lidocaine Applied: Other: lidocaine Applied: Other: lidocaine 4% 4% 4% Treatment Notes Electronic Signature(s) Signed: 10/10/2015 3:55:28 PM By: Regan Lemming [name], RN Lemanski, Tennyson D. ([id number]) Entered By: Regan Lemming on 10/10/2015 11:33:30 Struthers, CIAN BALDASSARRE ([id number]) -------------------------------------------------------------------------------- Multi-Disciplinary Care Plan Details Patient Name: SHENIA, BASKERVILLE 10/10/2015 10:45 Date of Service: AM Medical Record [id number] Number: Patient Account Number: [id number] [birth year] (69 y.o. Treating RN: Baruch Gouty, RN, [name], [name] Date of Birth/Sex: Female) Other Clinician: Primary Care Physician: [facility], Delana Meyer  Treating [name] Referring Physician: Glendon Axe Physician/Extender: Weeks in Treatment: 25 Active Inactive Orientation to the Wound Care Program Nursing Diagnoses: Knowledge deficit related to the wound healing center program Goals: Patient/caregiver will verbalize understanding of the [facility] Date Initiated: 01/31/2015 Goal Status: Active Interventions: Provide education on orientation to the wound center Notes: Venous Leg Ulcer Nursing Diagnoses: Potential for venous Insuffiency (use before diagnosis confirmed) Goals: Non-invasive venous studies are completed as ordered Date Initiated: 01/31/2015 Goal Status: Active Patient/caregiver will verbalize understanding of disease process and disease management Date Initiated: 01/31/2015 Goal Status: Active Interventions: Assess peripheral edema status every visit. Notes: Wound/Skin Impairment Nursing Diagnoses: BREALE, WILKENS. ([id number]) Impaired tissue integrity Knowledge deficit related to smoking impact on wound healing Goals: Patient/caregiver will verbalize understanding of skin care regimen Date Initiated: 01/31/2015 Goal Status: Active Ulcer/skin breakdown will heal within 14 weeks Date Initiated: 01/31/2015 Goal Status: Active Interventions: Assess ulceration(s) every visit Notes: Electronic Signature(s) Signed: 10/10/2015 3:55:28 PM By: Regan Lemming [name], RN Entered By: Regan Lemming on 10/10/2015 11:16:52 Kydd, Tia Masker ([id number]) -------------------------------------------------------------------------------- Pain Assessment Details Patient Name: AVIANCA, GRAINGER D. 10/10/2015 10:45 Date of Service: AM Medical Record [id number] Number: Patient Account Number: [id number] [birth year] (69 y.o. Treating RN: Baruch Gouty, RN, [name], [name] Date of Birth/Sex: Female) Other Clinician: Primary Care Physician: [place], [place], [place] Referring Physician: [facility],  [name] Physician/Extender: Weeks in Treatment: 36 Active Problems Location of Pain Severity and Description of Pain Patient Has Paino No Site Locations Pain Management and Medication Current Pain Management: Electronic Signature(s) Signed: 10/10/2015 3:55:28 PM By: Regan Lemming [name], RN Entered By: Regan Lemming on 10/10/2015 10:44:19 Szafranski, Tia Masker ([id number]) -------------------------------------------------------------------------------- Patient/Caregiver Education Details Patient Name: KALLIE, DIETERICH D. 10/10/2015 10:45 Date of Service: AM Medical Record [id number] Number: Patient Account Number: [id number] 03/05/1946 (69 y.o. Treating RN: Baruch Gouty, RN, [name], [name] Date of Birth/Gender: Female) Other Clinician: Primary Care Physician: [facility], [place] [name] Referring Physician: [place], [place]: Weeks in Treatment: 96 Education Assessment Education Provided To: Patient Education Topics Provided Welcome To [facility]: Methods: Explain/Verbal Responses: State content correctly Electronic Signature(s) Signed: 10/10/2015 12:22:24 PM By: Regan Lemming [name], RN Entered By: Regan Lemming on 10/10/2015 12:22:24 ARGYLE, CICCONE ([id number]) -------------------------------------------------------------------------------- Wound Assessment Details Patient Name: YAVONNE, BONA D. 10/10/2015 10:45 Date of Service: AM Medical Record [id number] Number: Patient Account Number: [id number] 11/18/1945 (69 y.o. Treating RN: [name], RN, [name], [name] Date of Birth/Sex: Female) Other Clinician: Primary Care Physician: [facility], Delana Meyer Treating Britto, Errol Referring Physician: Glendon Axe Physician/Extender: Weeks in Treatment: 36 Wound Status Wound Number: 2 Primary Venous Leg Ulcer Etiology: Wound Location: Left Lower Leg - Distal Wound Open Wounding Event: Blister Status: Date Acquired: 12/30/2014 Comorbid Cataracts, Asthma, Coronary Artery Weeks Of  Treatment: 36 History: Disease, Hypertension, Type II Clustered Wound: Yes Diabetes, Osteoarthritis, Neuropathy Photos Photo Uploaded By: Regan Lemming on 10/10/2015 14:17:10 Wound Measurements Length: (cm) 3 Width: (cm) 4.5 Depth: (cm) 0.2 Area: (cm) 10.603 Volume: (cm) 2.121 % Reduction in Area: -1399.7% % Reduction in Volume: -2887.3% Epithelialization: None Tunneling: No Wound Description Full Thickness Without Foul Odor Aft Classification: Exposed Support Structures Diabetic Severity Grade 2 (Wagner): Wound Margin: Thickened Exudate Amount: Large Exudate Type: Serous Exudate Color: amber KELLEY, HENSHAW. ([id number]) er Cleansing: No Wound Bed Granulation Amount: Medium (34-66%) Exposed Structure Granulation Quality: Red, Pink Fascia Exposed: No Necrotic Amount: Medium (34-66%) Fat Layer Exposed: No Necrotic Quality: Adherent Slough Tendon Exposed: No Muscle Exposed: No Joint Exposed: No Bone Exposed: No Limited to Skin Breakdown Periwound Skin Texture Texture Color No Abnormalities Noted: No No Abnormalities Noted: No Localized Edema: Yes Temperature / Pain Scarring: Yes Temperature: No Abnormality Moisture Tenderness on Palpation: Yes No Abnormalities Noted: No Maceration: Yes Moist: Yes Wound Preparation Ulcer Cleansing: Rinsed/Irrigated with Saline Topical Anesthetic Applied: Other: lidocaine 4%, Treatment Notes Wound #2 (Left, Distal Lower Leg) 1. Cleansed with: Cleanse wound with antibacterial soap and water 3. Peri-wound Care: Moisturizing lotion Skin Prep 4. Dressing Applied: Other dressing (specify in notes) 5. Secondary Dressing Applied ABD Pad Dry Gauze 7. Secured with 2 Layer Lite Compression System - Left Lower Extremity Electronic Signature(s) Signed: 10/10/2015 3:55:28 PM By: Regan Lemming [name], RN Entered By: Regan Lemming on 10/10/2015 11:04:27 Tackett, DALMA DOTTERER  ([id number]) -------------------------------------------------------------------------------- Wound Assessment Details Patient Name: KALIKA, PATRAW D. 10/10/2015 10:45 Date of Service: AM Medical Record [id number] Number: Patient Account Number: [id number] January 16, 1946 (69 y.o. Treating RN: Baruch Gouty, RN, [name], [name] Date of Birth/Sex: Female) Other Clinician: Primary Care Physician: [facility], Delana Meyer Treating [name] Referring Physician: Glendon Axe Physician/Extender: Weeks in Treatment: 36 Wound Status Wound Number: 5 Primary Venous Leg Ulcer Etiology: Wound Location: Right Lower Leg - Posterior Wound Open Wounding Event: Gradually Appeared Status: Date Acquired: 04/29/2015 Comorbid Cataracts, Asthma, Coronary Artery Weeks Of Treatment: 20 History: Disease, Hypertension, Type II Clustered Wound: No Diabetes, Osteoarthritis, Neuropathy Photos Photo  Uploaded By: Regan Lemming on 10/10/2015 14:17:40 Wound Measurements Length: (cm) 5.5 Width: (cm) 8 Depth: (cm) 0.7 Area: (cm) 34.558 Volume: (cm) 24.19 % Reduction in Area: -18281.9% % Reduction in Volume: -127215.8% Epithelialization: None Tunneling: No Undermining: No Wound Description Full Thickness Without Exposed Foul Odor Classification: Support Structures Kensinger, Nychelle D. ([id number]) After Cleansing: No Diabetic Severity Grade 2 Earleen Newport): Wound Margin: Thickened Exudate Amount: Large Exudate Type: Serosanguineous Exudate Color: red, brown Wound Bed Granulation Amount: Medium (34-66%) Granulation Quality: Red, Pink Necrotic Amount: Medium (34-66%) Necrotic Quality: Adherent Slough Periwound Skin Texture Texture Color No Abnormalities Noted: No No Abnormalities Noted: No Localized Edema: Yes Temperature / Pain Scarring: Yes Temperature: No Abnormality Moisture Tenderness on Palpation: Yes No Abnormalities Noted: No Moist: Yes Wound Preparation Ulcer Cleansing: Rinsed/Irrigated with Saline Topical  Anesthetic Applied: Other: lidocaine 4%, Treatment Notes Wound #5 (Right, Posterior Lower Leg) 1. Cleansed with: Cleanse wound with antibacterial soap and water 3. Peri-wound Care: Skin Prep 4. Dressing Applied: Santyl Ointment Other dressing (specify in notes) 5. Secondary Dressing Applied ABD Pad 8. Negative Pressure Wound Therapy Wound Vac to wound continuously at 169mm/hg pressure Black Foam Notes 1 piece of black foam used Electronic Signature(s) Signed: 10/10/2015 3:55:28 PM By: Regan Lemming [name], RN Entered By: Regan Lemming on 10/10/2015 11:04:46 Faircloth, MICKEY AUCHTER ([id number]) LIBIA, FYE ([id number]) -------------------------------------------------------------------------------- Wound Assessment Details Patient Name: JACALYN, SOULSBY D. 10/10/2015 10:45 Date of Service: AM Medical Record [id number] Number: Patient Account Number: [id number] April 02, 1946 (69 y.o. Treating RN: Baruch Gouty, RN, [name], [name] Date of Birth/Sex: Female) Other Clinician: Primary Care Physician: [facility], Delana Meyer Treating [name] Referring Physician: Glendon Axe Physician/Extender: Weeks in Treatment: 36 Wound Status Wound Number: 6 Primary Diabetic Wound/Ulcer of the Lower Etiology: Extremity Wound Location: Right Lower Leg - Medial Wound Open Wounding Event: Gradually Appeared Status: Date Acquired: 10/10/2015 Comorbid Cataracts, Asthma, Coronary Artery Weeks Of Treatment: 0 History: Disease, Hypertension, Type II Clustered Wound: No Diabetes, Osteoarthritis, Neuropathy Photos Photo Uploaded By: Regan Lemming on 10/10/2015 14:17:41 Wound Measurements Length: (cm) 2.2 Width: (cm) 2 Depth: (cm) 0.1 Area: (cm) 3.456 Volume: (cm) 0.346 % Reduction in Area: % Reduction in Volume: Epithelialization: None Tunneling: No Undermining: No Wound Description Classification: Unable to visualize wound bed Foul Odor Af Wound Margin: Distinct, outline attached Druckenmiller, Estrella D.  ([id number]) ter Cleansing: No Exudate Amount: Medium Exudate Type: Serosanguineous Exudate Color: red, brown Wound Bed Granulation Amount: Small (1-33%) Exposed Structure Granulation Quality: Pink, Pale Fascia Exposed: No Necrotic Amount: Large (67-100%) Fat Layer Exposed: No Necrotic Quality: Eschar, Adherent Slough Tendon Exposed: No Muscle Exposed: No Joint Exposed: No Bone Exposed: No Limited to Skin Breakdown Periwound Skin Texture Texture Color No Abnormalities Noted: No No Abnormalities Noted: No Callus: No Atrophie Blanche: No Crepitus: No Cyanosis: No Excoriation: No Ecchymosis: No Fluctuance: No Erythema: No Friable: No Hemosiderin Staining: No Induration: No Mottled: No Localized Edema: No Pallor: No Rash: No Rubor: No Scarring: No Temperature / Pain Moisture Temperature: No Abnormality No Abnormalities Noted: No Tenderness on Palpation: Yes Dry / Scaly: No Maceration: No Moist: Yes Wound Preparation Ulcer Cleansing: Rinsed/Irrigated with Saline Topical Anesthetic Applied: Other: lidocaine 4%, Treatment Notes Wound #6 (Right, Medial Lower Leg) 1. Cleansed with: Cleanse wound with antibacterial soap and water 3. Peri-wound Care: Skin Prep 4. Dressing Applied: Santyl Ointment Other dressing (specify in notes) 5. Secondary Dressing Applied Fait, Aliany D. ([id number]) ABD Pad 8. Negative Pressure Wound Therapy Wound Vac to wound continuously at 147mm/hg pressure Black Foam Notes 1 piece  of black foam used Electronic Signature(s) Signed: 10/10/2015 3:55:28 PM By: Regan Lemming [name], RN Entered By: Regan Lemming on 10/10/2015 11:00:14 HUDSYN, TRAW ([id number]) -------------------------------------------------------------------------------- Vitals Details Patient Name: NHUNG, NEIDERHISER D. 10/10/2015 10:45 Date of Service: AM Medical Record [id number] Number: Patient Account Number: [id number] 06/17/1946 (69 y.o. Treating RN: [name], RN, [name],  [name] Date of Birth/Sex: Female) Other Clinician: Primary Care Physician: [facility], [place] Britto, Errol Referring Physician: Th[place], [place]: Weeks in Treatment: 36 Vital Signs Time Taken: 10:44 Temperature (F): 98.2 Height (in): 65 Pulse (bpm): 76 Weight (lbs): 248 Respiratory Rate (breaths/min): 18 Body Mass Index (BMI): 41.3 Blood Pressure (mmHg): 143/56 Reference Range: 80 - 120 mg / dl Electronic Signature(s) Signed: 10/10/2015 3:55:28 PM By: Regan Lemming [name], RN Entered By: Regan Lemming on 10/10/2015 10:48:25

## 2015-10-11 NOTE — Progress Notes (Addendum)
EIREANN, SCHAFFNER ([id number]) Visit Report for 10/10/2015 Chief Complaint Document Details Patient Name: [name], [name] 10/10/2015 10:45 Date of Service: AM Medical Record [id number] Number: Patient Account Number: [id number] [birth year] (69 y.o. Treating RN: Baruch Gouty, RN, [name], [name] Date of Birth/Sex: Female) Other Clinician: Primary Care Treating [place], [name] Asa Baudoin Physician: Physician/Extender: Referring Physician: Glendon Axe Weeks in Treatment: 22 Information Obtained from: Patient Chief Complaint Patient presents to the wound care center for a consult due non healing wound 69 year old patient comes with a history of having a ulcer on the left lower extremity for the past 4 weeks. she says she's had swelling of both lower extremities for about a year after she started having prednisone. 02/07/2015 -- her vascular appointments obtained were in the first and third week of June. she is able to go to [place] and we will try and get her some earlier appointments. Other than that nothing else has changed in her management. Electronic Signature(s) Signed: 10/10/2015 11:55:08 AM By: [name], [name] Entered By: [name] on 10/10/2015 11:55:08 Yablonski, BRITTAN PAVON ([id number]) -------------------------------------------------------------------------------- Cellular or Tissue Based Product Details Patient Name: [name] 10/10/2015 10:45 Date of Service: AM Medical Record [id number] Number: Patient Account Number: [id number] Feb 26, 1946 (68 y.o. Treating RN: Baruch Gouty, RN, [name], [name] Date of Birth/Sex: Female) Other Clinician: Primary Care Treating [place], [name] Naijah Lacek Physician: Physician/Extender: Referring Physician: Glendon Axe Weeks in Treatment: 48 Cellular or Tissue Based Wound #2 Left,Distal Lower Leg Product Type Applied to: Performed By: Physician [name], MD Cellular or Tissue Based Apligraf Product Type: Time-Out Taken:  No Location: trunk / arms / legs Wound Size (sq cm): 13.5 Product Size (sq cm): 44 Waste Size (sq cm): 30 Waste Reason: extra Amount of Product Applied (sq cm): 14 Lot #: [id number] Expiration Date: 10/16/2015 Fenestrated: Yes Instrument: Blade Reconstituted: Yes Solution Type: saline Solution Amount: 54ml Lot #: [id number] Solution Expiration 05/25/2017 Date: Secured: Yes Secured With: Steri-Strips Dressing Applied: Yes Primary Dressing: mepitel 1 Procedural Pain: 0 Post Procedural Pain: 0 Response to Treatment: Procedure was tolerated well Post Procedure Diagnosis Same as Pre-procedure Electronic Signature(s) Signed: 10/10/2015 11:55:00 AM By: [name], [name] Greenwell, Tommye D. ([id number]) Entered By: [name] on 10/10/2015 11:54:59 Kachmar, Mackinsey D. ([id number]) -------------------------------------------------------------------------------- HPI Details Patient Name: [name], [name] 10/10/2015 10:45 Date of Service: AM Medical Record [id number] Number: Patient Account Number: [id number] 1946/01/09 (69 y.o. Treating RN: Baruch Gouty, RN, [name], [name] Date of Birth/Sex: Female) Other Clinician: Primary Care Treating [place], [name] Jacquez Sheetz Physician: Physician/Extender: Referring Physician: Glendon Axe Weeks in Treatment: 63 History of Present Illness HPI Description: 69 year old patient who is known to have diabetes mellitus type 2, chronic renal insufficiency, coronary artery disease, hypertension, hypercholesterolemia, temporal arteritis and inflammatory arthritiss also has a history of having a hysterectomy and some orthopedic related surgeries. The ulcer on the left lower extremity started off as a blister and then. Got progressively worse. She does not have any fever or chills and has not had any recent surgical intervention for this. Her last hemoglobin A1c was 10.1 in September 2015. She has been recently put on doxycycline by her PCP. She is now  also allergic to doxycycline and was this was changed over to Keflex. 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 She does see a cardiologist and also takes a diuretic. 02/07/2015 her arterial and venous duplex studies to  be done have dates been given as the first and third week of June. This is at [facility]. We are going to try and get early appointments at [facility]. other than that nothing has changed in her management. 02/14/2015 -- we have been able to get her an appointment in [facility] on May 20 which is much earlier than her previous ones at [place]. She continues with her prednisone and her sugars are in the range of 150-200. 02/21/2015 We were able to get a vascular lab workup for her today and she is going to be there at 2:00 this afternoon. the swelling of her leg has gone down significantly but she still has some tenderness over the wounds. 02/28/2015 - She has had one of two vascular workups done, and this coming Tuesday has another, at [facility]. She continues to be on steroid medications. She has significant sensitivity in her left lower extremity and has pain suggestive of neuropathic pain and I have asked her to address this with her primary care physician. 03/07/2015 -- The patient saw Dr. Lucky Cowboy for a consultation and he has had her arteries are okay but she has 2 incompetent veins on the left lower extremity and he is going to set her up for surgery. Official report is awaited. Addendum: Official reports are now available and on 03/04/2015. She was seen and lower extremity venous duplex exam was done. There was reflux present within the left greater saphenous vein below the knee and also the left small saphenous vein. Arterial duplex showed normal triphasic waveforms throughout the left lower extremity without any significant stenosis.  Her ABIs were noncompressible bilaterally but a waveforms were normal and a digital pressures were normal bilaterally consistent with no significant arterial insufficiency. He has recommended endovenous ablation of both the left small saphenous and the left great saphenous vein. This would still be scheduled later. NAVYA, NELTON ([id number]) 03/14/2015 -- she has heard back from the vascular office and has surgery scheduled for sometime in July. Her rheumatologist has decreased her prednisone dosage but she still on it. She has also had cataract surgery in her right eye recently this week. 03/20/2015 - No new complaints today. Pain improved. No fever or chills. Tolerating 2 layer compression. 04/14/2015 -- she was doing very well today she went off on vacation and now her edema has increased markedly the ulceration is bigger and her diabetes is not under control. 04/21/2015 -- I spoke to her PCP Dr. Candiss Norse and discussed the management which would include being seen by a general surgeon for debridement and taking multiple punch biopsies which would help in establishing the diagnosis of this is a vasculitis. She is agreeable about this and will set her up for the procedure with Dr. Tamala Julian at [facility]. She was seen by the surgeon Dr. Jamal Collin. 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 She is scheduled to have laser ablation done next week.Pt was sent here for possible biopsy to look for vasculitis. Feel it would be better to wait after laser ablation is completed- the ulcer may heal fully and biopsy may not be necessary 04/29/2015 -- she had the venous ablation done by Dr. Lucky Cowboy last Friday and we do not have any notes yet. She is doing fine otherwise. 05/06/2015 --Review of her recent vascular intervention shows that she was seen by Dr. Lucky Cowboy on 04/29/2015. The follow-up duplex  which  was done showed that both the great saphenous vein and the small saphenous vein remained patent with reflux consistent with an unsuccessful ablation. He has rescheduled her for another the endovenous ablation to be done in about 4 weekso time. 05/13/2015 -- he was seen by her surgeon Dr. Jamal Collin who asked her to continue with conservative therapy and he would speak to Dr. Lucky Cowboy about her management. Dr. Lucky Cowboy is going to schedule her surgery in the middle of August for a repeat endovenous ablation. Her pus culture from last week has grown : [place] her noted her sensitivity report but due to her multiple allergies I had tried clindamycin and she developed a rash with this too. She has been prescribed and anti-buttocks in the ER and is has it at home and she will let is know what she is going to be taking. 05/20/2015 -- she has developed a small spot on her right lower extremity but besides that it is not a full fledged ulceration. She did not get to see Dr. Lucky Cowboy last week and hopefully she will see him in the near future. 05/27/2015 -she is still awaiting her appointment with Dr.Dew and her vascular procedure is not scheduled until August 19. She will be seeing her PCP tomorrow and I have asked her to convey our discussion so that she is aware that debridement has not been done yet. 06/03/2015 -- was seen by her rheumatologist Dr. 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 He is lowering her prednisone to 12-1/2 mg for 1 month and then 10 mg per the next month. I will again make an attempt to speak to her PCP Dr. Candiss Norse and her surgeon Dr. Lucky Cowboy to see if he can organize for a debridement in the OR with multiple biopsies to establish a diagnosis of vasculitis or pyoderma  gangrenosum. 06/17/2015 -- Dr.Dew did her vascular procedure last week and a follow-up venous ultrasound shows good Minetti, Whitlee D. ([id number]) resolution of the veins as per the patient's history. He is to see her back in 2 weeks. 07/07/2015. -- the patient has had a heavy growth of Proteus mirabilis and Enterococcus faecalis. These are sensitive to several drugs but the problem is she has allergies to all of these and hence I would like her to see Dr. Ola Spurr for this. She is also due to see Dr. Lucky Cowboy tomorrow and I will discussed the management with him including debridement under anesthesia and possible biopsies. 07/14/2015 -- she has an appointment to see Dr. Ola Spurr tomorrow and did see Dr. Bunnie Domino PA who will discuss my request with him. 07/21/2015 -- saw Dr. Ola Spurr was able to do a test on her and has put her on amoxicillin. She has been tolerating that and has had no problems with allergies to this. 07/28/2015 -- Last Friday I spoke to Dr. Leotis Pain regarding her care and he said that her right leg did not need any surgery and on the left leg was doing pretty good. We did agree that if she undergoes any procedure in the future he would do a couple of punch biopsies of the wound. 08/18/2015 -- her right leg is very tender and there is significant amount of slough. The left leg is looking much better 09/02/2015 -- she is going to have a debridement and punch biopsies of her right lower extremity  by Dr. Leotis Pain this coming Thursday. Also seen Dr. Ola Spurr who has continued her on ciprofloxacin. 09/09/2015 -- on 09/04/2015 Dr. Leotis Pain took her to surgery - Irrigation and excisional debridement of skin, soft tissue, and muscle to about 40 cm2 to the right posterior calf with biopsy. Pathology results are -- DIAGNOSIS: SKIN, RIGHT LOWER EXTREMITY; BIOPSY: SKIN AND SOFT TISSUE WITH ULCERATION, SEE NOTE. - NEGATIVE FOR DYSPLASIA AND MALIGNANCY. Note: There is acute inflammation  and ulceration of the epidermis. The dermis shows nonspecific inflammation, neovascularization, and hemosiderin deposition. The differential diagnosis for these findings includes stasis dermatitis, nonspecific dermatitis, and infection. Correlation with clinical findings is required. A PAS fungal stain is obtained and results will be reported in an addendum. cultures were also sent and this grew Pseudomonas aeruginosa, Escherichia coli, Proteus mirabilis, Klebsiella pneumoniae and they were all sensitive to ciprofloxacin which she is on. 09/16/2015 -- he saw Dr. 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 He is going to call the patient in and discuss with her the management possibly with Imuran. I will continue treating her locally. 09/23/2015 -- she saw Dr. Ola Spurr today who was going to continue the antibiotics for now and stop after this course. She has an appointment to see Dr. Jefm Bryant in about a week's time. Her wound VAC has arrived but she did not bring it with her today. We will try and set her up for changes to the right leg 3 times a week. She is here for her first application of Apligraf to the left lower extremity. 09/30/2015 -- she has seen Dr. 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 because she is ambulatory at home health will not apply a wound VAC and she will have to come here 3 times a week on Monday Wednesday and Friday. 10/10/2015 -- she is here for a second application of Apligraf to her left lower extremity. Electronic Signature(s) Signed: 10/10/2015 11:58:00 AM By: [name], [name] Previous Signature: 10/10/2015 11:55:18 AM Version By: [name], [name] [name], [name] ([id number]) Entered By: [name] on 10/10/2015 11:58:00 [name], [name] ([id number]) -------------------------------------------------------------------------------- Physical Exam Details Patient Name: [name], [name] 10/10/2015 10:45 Date of Service: AM Medical Record [id number] Number: Patient Account Number: [id number] April 30, 1946 (69 y.o. Treating RN: Baruch Gouty, RN, [name], [name] Date of Birth/Sex: Female) Other Clinician: Primary Care Treating [place], [place], [place] Physician: Physician/Extender: Referring Physician: Candiss Norse, [name] Weeks in Treatment: 36 Constitutional . Pulse regular. Respirations normal and unlabored. Afebrile. . Eyes Nonicteric. Reactive to light. Ears, Nose, Mouth, and Throat Lips, teeth, and gums WNL.Marland Kitchen Moist mucosa without lesions . Neck supple and nontender. No palpable supraclavicular or cervical adenopathy. Normal sized without goiter. Respiratory WNL. No retractions.. Cardiovascular Pedal Pulses WNL. No clubbing, cyanosis or edema. Gastrointestinal (GI) Abdomen without masses or tenderness.. No liver or spleen enlargement or tenderness.. Genitourinary (GU) No hydrocele, spermatocele, tenderness of the cord, or testicular mass.Marland Kitchen Penis without lesions.Lowella Fairy without lesions. No cystocele, or rectocele. Pelvic support intact, no discharge. Marland Kitchen Urethra without masses, tenderness or scarring.Marland Kitchen Lymphatic No adneopathy. No adenopathy. No adenopathy. Musculoskeletal Adexa without tenderness or enlargement.. Digits and nails w/o clubbing, cyanosis, infection, petechiae, ischemia, or inflammatory conditions.. Integumentary (Hair, Skin) No suspicious lesions. No crepitus or fluctuance. No peri-wound warmth or erythema. No masses.Marland Kitchen  Psychiatric Judgement and insight Intact.. No evidence of depression, anxiety, or agitation.. Notes The left lower extremity wound looks very good and she is ready for a next application of Apligraf. The right extremity continues to have slough at the edges and there is a new area anterior  medial to the original one which now has slough. [name], [name] ([id number]) Electronic Signature(s) Signed: 10/10/2015 11:58:23 AM By: [name], [name] Previous Signature: 10/10/2015 11:56:40 AM Version By: [name], [name] Entered By: [name] on 10/10/2015 11:58:22 Ponti, BREEANN REYES ([id number]) -------------------------------------------------------------------------------- Physician Orders Details Patient Name: AMIYLA, [name] D. 10/10/2015 10:45 Date of Service: AM Medical Record [id number] Number: Patient Account Number: [id number] September 11, 1946 (69 y.o. 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 Wound #5 Right,Posterior Lower Leg o Cleanse wound with mild soap and water o May Shower, gently pat wound dry prior to applying new dressing. Wound #6 Right,Medial Lower Leg o Cleanse wound with mild soap and water o May Shower, gently pat wound dry prior to applying new dressing. Anesthetic Wound #2 Left,Distal Lower Leg o Topical Lidocaine 4% cream applied  to wound bed prior to debridement Wound #5 Right,Posterior Lower Leg o Topical Lidocaine 4% cream applied to wound bed prior to debridement Wound #6 Right,Medial Lower Leg o Topical Lidocaine 4% cream applied to wound bed prior to debridement Moncrieffe, Chelsye D. ([id number]) Skin Barriers/Peri-Wound Care Wound #2 Left,Distal Lower Leg o Skin Prep Wound #5 Right,Posterior Lower Leg o Skin Prep Wound #6 Right,Medial Lower Leg o Skin Prep Primary Wound Dressing Wound #2 Left,Distal Lower Leg o Other: - Apligraf applied by MD Wound #5 Right,Posterior Lower Leg o Santyl Ointment Wound #6 Right,Medial Lower Leg o Santyl Ointment Secondary Dressing Wound #2 Left,Distal Lower Leg o ABD pad o Dry Gauze Wound #6 Right,Medial Lower Leg o Dry Gauze Dressing Change Frequency Wound #2 Left,Distal Lower Leg o Change dressing every week Wound #5 Right,Posterior Lower Leg o Other: - 12/20 Wound #6 Right,Medial Lower Leg o Other: - 12/20 Follow-up Appointments Wound #2 Left,Distal Lower Leg o Return Appointment in 1 week. o Nurse Visit as needed - 12/20 Wound #5 Right,Posterior Lower Leg o Return Appointment in 1 week. o Nurse Visit as needed - 12/20 ZAINAH, FRINK ([id number]) Wound #6 Right,Medial Lower Leg o Return Appointment in 1 week. o Nurse Visit as needed - 12/20 Edema Control Wound #2 Left,Distal Lower Leg o 2 Layer Lite Compression System - Left Lower Extremity - kerlix and coban Negative Pressure Wound Therapy Wound #5 Right,Posterior Lower Leg o Wound VAC settings at 125/130 mmHg continuous pressure. Use BLACK/GREEN foam to wound cavity. Use WHITE foam to fill any tunnel/s and/or undermining. Change VAC dressing 3  X WEEK. Change canister as indicated when full. Nurse may titrate settings and frequency of dressing changes as clinically indicated. Wound #6 Right,Medial Lower Leg o Wound VAC settings at 125/130 mmHg continuous  pressure. Use BLACK/GREEN foam to wound cavity. Use WHITE foam to fill any tunnel/s and/or undermining. Change VAC dressing 3 X WEEK. Change canister as indicated when full. Nurse may titrate settings and frequency of dressing changes as clinically indicated. Advanced Therapies Wound #2 Left,Distal Lower Leg o Apligraf application in clinic; including contact layer, fixation with steri strips, dry gauze and cover dressing. Electronic Signature(s) Signed: 10/10/2015 3:55:28 PM By: Regan Lemming [name], RN Signed: 10/10/2015 4:28:44 PM By: [name], [name] Entered By: Regan Lemming on 10/10/2015 12:12:24 TERRILL, TROMPETER ([id number]) -------------------------------------------------------------------------------- Problem List Details Patient Name: MARIKO, RUDGE 10/10/2015 10:45 Date of Service: AM Medical Record [id number] Number: Patient Account Number: [id number] 09/24/1946 (69 y.o. 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 ([id number]) Entered By: [name] on  10/10/2015 11:54:26 Westley, Kateland D. ([id number]) -------------------------------------------------------------------------------- Progress Note Details Patient Name: [name], [name] 10/10/2015 10:45 Date of Service: AM Medical Record [id number] Number: Patient Account Number: [id number] 07-06-1946 (69 y.o. Treating RN: Baruch Gouty, RN, [name], [name] Date of Birth/Sex: Female) Other Clinician: Primary Care Treating [place], [name] Troy Hartzog Physician: Physician/Extender: Referring Physician: Glendon Axe Weeks in Treatment: 28 Subjective Chief Complaint Information obtained from Patient Patient presents to the wound care center for a consult due non healing wound 69 year old patient comes with a history of having a ulcer on the left lower extremity for the past 4 weeks. she says she's had swelling of both lower extremities for about a year after she started having prednisone. 02/07/2015 -- her vascular appointments obtained were in the first and third week of June. she is able to go to [place] and we will try and get her some earlier appointments. Other than that nothing else has changed in her management. History of Present Illness (HPI) 69 year old patient who is known to have diabetes mellitus type 2, chronic renal insufficiency, coronary artery disease, hypertension, hypercholesterolemia, temporal arteritis and inflammatory arthritiss also has a history of having a hysterectomy and some orthopedic related surgeries. The ulcer on the left lower extremity started off as a blister and then. Got progressively worse. She does not have any fever or chills and has not had any recent surgical intervention for this. Her last hemoglobin A1c was 10.1 in September 2015. She has been recently put on doxycycline by her PCP. She is now also allergic to doxycycline and was this was changed over to Keflex. due to her temporal arteritis she has been on prednisone for about a year and she says ever  since that she has had swelling of both lower  extremities. She does see a cardiologist and also takes a diuretic. 02/07/2015 her arterial and venous duplex studies to be done have dates been given as the first and third week of June. This is at [facility] We are going to try and get early appointments at [facility]. other than that nothing has changed in her management. 02/14/2015 -- we have been able to get her an appointment in [facility] on May 20 which is much earlier than her previous ones at [place]. She continues with her prednisone and her sugars are in the range of 150-200. 02/21/2015 We were able to get a vascular lab workup for her today and she is going to be there at 2:00 this afternoon. the swelling of her leg has gone down significantly but she still has some tenderness over the wounds. 02/28/2015 - She has had one of two vascular workups done, and this coming Tuesday has another, at [facility]. She continues to be on steroid medications. She has significant sensitivity in her left lower extremity and has pain suggestive of neuropathic pain and I have asked her to address this with her primary care physician. 03/07/2015 -- The patient saw Dr. Lucky Cowboy for a consultation and he has had her arteries are okay but she has Platte, Mayan D. ([id number]) 2 incompetent veins on the left lower extremity and he is going to set her up for surgery. Official report is awaited. Addendum: Official reports are now available and on 03/04/2015. She was seen and lower extremity venous duplex exam was done. There was reflux present within the left greater saphenous vein below the knee and also the left small saphenous vein. Arterial duplex showed normal triphasic waveforms throughout the left lower extremity without any significant stenosis. Her ABIs were noncompressible bilaterally but a waveforms were normal and a digital pressures were normal bilaterally consistent with  no significant arterial insufficiency. He has recommended endovenous ablation of both the left small saphenous and the left great saphenous vein. This would still be scheduled later. 03/14/2015 -- she has heard back from the vascular office and has surgery scheduled for sometime in July. Her rheumatologist has decreased her prednisone dosage but she still on it. She has also had cataract surgery in her right eye recently this week. 03/20/2015 - No new complaints today. Pain improved. No fever or chills. Tolerating 2 layer compression. 04/14/2015 -- she was doing very well today she went off on vacation and now her edema has increased markedly the ulceration is bigger and her diabetes is not under control. 04/21/2015 -- I spoke to her PCP Dr. Candiss Norse and discussed the management which would include being seen by a general surgeon for debridement and taking multiple punch biopsies which would help in establishing the diagnosis of this is a vasculitis. She is agreeable about this and will set her up for the procedure with Dr. Tamala Julian at [facility]. She was seen by the surgeon Dr. Jamal Collin. 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 She is scheduled to have laser ablation done next week.Pt was sent here for possible biopsy to look for vasculitis. Feel it would be better to wait after laser ablation is completed- the ulcer may heal fully and biopsy may not be necessary 04/29/2015 -- she had the venous ablation done by Dr. Lucky Cowboy last Friday and we do not have any notes yet. She is doing fine otherwise. 05/06/2015 --Review of  her recent vascular intervention shows that she was seen by Dr. Lucky Cowboy on 04/29/2015. The follow-up duplex which was done showed that both the great saphenous vein and the small saphenous vein remained patent with reflux consistent with an unsuccessful ablation. He  has rescheduled her for another the endovenous ablation to be done in about 4 weeks time. 05/13/2015 -- he was seen by her surgeon Dr. Jamal Collin who asked her to continue with conservative therapy and he would speak to Dr. Lucky Cowboy about her management. Dr. Lucky Cowboy is going to schedule her surgery in the middle of August for a repeat endovenous ablation. Her pus culture from last week has grown : [place] her noted her sensitivity report but due to her multiple allergies I had tried clindamycin and she developed a rash with this too. She has been prescribed and anti-buttocks in the ER and is has it at home and she will let is know what she is going to be taking. 05/20/2015 -- she has developed a small spot on her right lower extremity but besides that it is not a full fledged ulceration. She did not get to see Dr. Lucky Cowboy last week and hopefully she will see him in the near future. 05/27/2015 -she is still awaiting her appointment with Dr.Dew and her vascular procedure is not scheduled until August 19. She will be seeing her PCP tomorrow and I have asked her to convey our discussion so Peitz, Meoshia D. ([id number]) that she is aware that debridement has not been done yet. 06/03/2015 -- was seen by her rheumatologist Dr. 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 He is lowering her prednisone to 12-1/2 mg for 1 month and then 10 mg per the next month. I will again make an attempt to speak to her PCP Dr. Candiss Norse and her surgeon Dr. 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 06/17/2015 -- Dr.Dew did her vascular procedure last week and a follow-up venous ultrasound shows good resolution of the veins as per  the patient's history. He is to see her back in 2 weeks. 07/07/2015. -- the patient has had a heavy growth of Proteus mirabilis and Enterococcus faecalis. These are sensitive to several drugs but the problem is she has allergies to all of these and hence I would like her to see Dr. Ola Spurr for this. She is also due to see Dr. Lucky Cowboy tomorrow and I will discussed the management with him including debridement under anesthesia and possible biopsies. 07/14/2015 -- she has an appointment to see Dr. Ola Spurr tomorrow and did see Dr. Bunnie Domino PA who will discuss my request with him. 07/21/2015 -- saw Dr. Ola Spurr was able to do a test on her and has put her on amoxicillin. She has been tolerating that and has had no problems with allergies to this. 07/28/2015 -- Last Friday I spoke to Dr. Leotis Pain regarding her care and he said that her right leg did not need any surgery and on the left leg was doing pretty good. We did agree that if she undergoes any procedure in the future he would do a couple of punch biopsies of the wound. 08/18/2015 -- her right leg is very tender and there is significant amount of slough. The left leg is looking  much better 09/02/2015 -- she is going to have a debridement and punch biopsies of her right lower extremity by Dr. Leotis Pain this coming Thursday. Also seen Dr. Ola Spurr who has continued her on ciprofloxacin. 09/09/2015 -- on 09/04/2015 Dr. Leotis Pain took her to surgery - Irrigation and excisional debridement of skin, soft tissue, and muscle to about 40 cm2 to the right posterior calf with biopsy. Pathology results are -- DIAGNOSIS: SKIN, RIGHT LOWER EXTREMITY; BIOPSY: SKIN AND SOFT TISSUE WITH ULCERATION, SEE NOTE. - NEGATIVE FOR DYSPLASIA AND MALIGNANCY. Note: There is acute inflammation and ulceration of the epidermis. The dermis shows nonspecific inflammation, neovascularization, and hemosiderin deposition. The differential diagnosis for these findings includes  stasis dermatitis, nonspecific dermatitis, and infection. Correlation with clinical findings is required. A PAS fungal stain is obtained and results will be reported in an addendum. cultures were also sent and this grew Pseudomonas aeruginosa, Escherichia coli, Proteus mirabilis, Klebsiella pneumoniae and they were all sensitive to ciprofloxacin which she is on. 09/16/2015 -- he saw Dr. 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 He is going to call the patient in and discuss with her the management possibly with Imuran. I will continue treating her locally. 09/23/2015 -- she saw Dr. Ola Spurr today who was going to continue the antibiotics for now and stop after this course. She has an appointment to see Dr. Jefm Bryant in about a week's time. Her wound VAC has arrived but she did not bring it with her today. We will try and set her up for changes to the right leg 3 times a week. She is here for her first application of Apligraf to the left lower extremity. NAYLAA, HAYMES ([id number]) 09/30/2015 -- she has seen Dr. 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 because she is ambulatory at home health will not apply a wound VAC and she will have to come here 3 times a week on Monday Wednesday and Friday. 10/10/2015 -- she is here for a second application of Apligraf to her left lower extremity. Objective Constitutional Pulse regular. Respirations normal and unlabored. Afebrile. Vitals Time Taken: 10:44 AM, Height: 65 in, Weight: 248 lbs, BMI: 41.3, Temperature: 98.2 F, Pulse: 76 bpm, Respiratory Rate: 18 breaths/min, Blood Pressure: 143/56 mmHg. Eyes Nonicteric. Reactive to light. Ears, Nose, Mouth, and Throat Lips, teeth, and gums WNL.Marland Kitchen Moist mucosa without lesions . Neck supple  and nontender. No palpable supraclavicular or cervical adenopathy. Normal sized without goiter. Respiratory WNL. No retractions.. Cardiovascular Pedal Pulses WNL. No clubbing, cyanosis or edema. Gastrointestinal (GI) Abdomen without masses or tenderness.. No liver or spleen enlargement or tenderness.. Genitourinary (GU) No hydrocele, spermatocele, tenderness of the cord, or testicular mass.Marland Kitchen Penis without lesions.Lowella Fairy without lesions. No cystocele, or rectocele. Pelvic support intact, no discharge. Marland Kitchen Urethra without masses, tenderness or scarring.Marland Kitchen Lymphatic No adneopathy. No adenopathy. No adenopathy. Musculoskeletal Adexa without tenderness or enlargement.. Digits and nails w/o clubbing, cyanosis, infection, petechiae, ischemia, or inflammatory conditions.Marland Kitchen [name], [name] ([id number]) Psychiatric Judgement and insight Intact.. No evidence of depression, anxiety, or agitation.. General Notes: The left lower extremity wound looks very good and she is ready for a next application of Apligraf. The right extremity continues to have slough at the edges and there is a new area anterior medial to the original one which now  has slough. Integumentary (Hair, Skin) No suspicious lesions. No crepitus or fluctuance. No peri-wound warmth or erythema. No masses.. Wound #2 status is Open. Original cause of wound was Blister. The wound is located on the Left,Distal Lower Leg. The wound measures 3cm length x 4.5cm width x 0.2cm depth; 10.603cm^2 area and 2.121cm^3 volume. The wound is limited to skin breakdown. There is no tunneling noted. There is a large amount of serous drainage noted. The wound margin is thickened. There is medium (34-66%) red, pink granulation within the wound bed. There is a medium (34-66%) amount of necrotic tissue within the wound bed including Adherent Slough. The periwound skin appearance exhibited: Localized Edema, Scarring, Maceration, Moist. Periwound temperature was  noted as No Abnormality. The periwound has tenderness on palpation. Wound #5 status is Open. Original cause of wound was Gradually Appeared. The wound is located on the Right,Posterior Lower Leg. The wound measures 5.5cm length x 8cm width x 0.7cm depth; 34.558cm^2 area and 24.19cm^3 volume. There is no tunneling or undermining noted. There is a large amount of serosanguineous drainage noted. The wound margin is thickened. There is medium (34-66%) red, pink granulation within the wound bed. There is a medium (34-66%) amount of necrotic tissue within the wound bed including Adherent Slough. The periwound skin appearance exhibited: Localized Edema, Scarring, Moist. Periwound temperature was noted as No Abnormality. The periwound has tenderness on palpation. Wound #6 status is Open. Original cause of wound was Gradually Appeared. The wound is located on the Right,Medial Lower Leg. The wound measures 2.2cm length x 2cm width x 0.1cm depth; 3.456cm^2 area and 0.346cm^3 volume. The wound is limited to skin breakdown. There is no tunneling or undermining noted. There is a medium amount of serosanguineous drainage noted. The wound margin is distinct with the outline attached to the wound base. There is small (1-33%) pink, pale granulation within the wound bed. There is a large (67-100%) amount of necrotic tissue within the wound bed including Eschar and Adherent Slough. The periwound skin appearance exhibited: Moist. The periwound skin appearance did not exhibit: Callus, Crepitus, Excoriation, Fluctuance, Friable, Induration, Localized Edema, Rash, Scarring, Dry/Scaly, Maceration, Atrophie Blanche, Cyanosis, Ecchymosis, Hemosiderin Staining, Mottled, Pallor, Rubor, Erythema. Periwound temperature was noted as No Abnormality. The periwound has tenderness on palpation. Assessment Active Problems ICD-10 E11.622 - Type 2 diabetes mellitus with other skin ulcer Bevans, Zeppelin D. 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 The right lower extremity has a couple of areas which are new anterior medial to the old wound and this has some slough. We will use Santyl over this including application of the wound VAC as planned. Due to the holiday her schedule has been changed appropriately and she will have her third Apligraf applied to the left lower extremity on December 30. Procedures Wound #2 Wound #2 is a Venous Leg Ulcer located on the Left,Distal Lower Leg. A skin graft procedure using a bioengineered skin substitute/cellular or tissue based product was performed by [name], MD.  Apligraf was applied and secured with Steri-Strips. 14 sq cm of product was utilized and 30 sq cm was wasted due to extra. Post Application, mepitel 1 was applied. A Time Out was not conducted prior to the start of the procedure. The procedure was tolerated well with a pain level of 0 throughout and a pain level of 0 following the procedure. Post procedure Diagnosis Wound #2: Same as Pre-Procedure . Plan Wound Cleansing: Wound #2 Left,Distal Lower Leg: Cleanse wound with mild soap and water May Shower, gently pat wound dry prior to applying new dressing. Wound #5 Right,Posterior Lower Leg: Cleanse wound with mild soap and water May Shower, gently pat wound dry prior to applying new dressing. Wound #6 Right,Medial Lower Leg: Cleanse wound with mild soap and water May  Shower, gently pat wound dry prior to applying new dressing. Anesthetic: FORTUNA, DELILLE ([id number]) Wound #2 Left,Distal Lower Leg: Topical Lidocaine 4% cream applied to wound bed prior to debridement Wound #5 Right,Posterior Lower Leg: Topical Lidocaine 4% cream applied to wound bed prior to debridement Wound #6 Right,Medial Lower Leg: Topical Lidocaine 4% cream applied to wound bed prior to debridement Skin Barriers/Peri-Wound Care: Wound #2 Left,Distal Lower Leg: Skin Prep Wound #5 Right,Posterior Lower Leg: Skin Prep Wound #6 Right,Medial Lower Leg: Skin Prep Primary Wound Dressing: Wound #2 Left,Distal Lower Leg: Other: - Apligraf applied by MD Wound #5 Right,Posterior Lower Leg: Santyl Ointment Wound #6 Right,Medial Lower Leg: Santyl Ointment Secondary Dressing: Wound #2 Left,Distal Lower Leg: ABD pad Dry Gauze Wound #6 Right,Medial Lower Leg: Dry Gauze Dressing Change Frequency: Wound #2 Left,Distal Lower Leg: Change dressing every week Wound #5 Right,Posterior Lower Leg: Other: - 12/20 Wound #6 Right,Medial Lower Leg: Other: - 12/20 Follow-up Appointments: Wound #2 Left,Distal Lower Leg: Return Appointment in 1 week. Nurse Visit as needed - 12/20 Wound #5 Right,Posterior Lower Leg: Return Appointment in 1 week. Nurse Visit as needed - 12/20 Wound #6 Right,Medial Lower Leg: Return Appointment in 1 week. Nurse Visit as needed - 12/20 Edema Control: Wound #2 Left,Distal Lower Leg: 2 Layer Lite Compression System - Left Lower Extremity - kerlix and coban Negative Pressure Wound Therapy: Wound #5 Right,Posterior Lower Leg: Wound VAC settings at 125/130 mmHg continuous pressure. Use BLACK/GREEN foam to wound cavity. Use WHITE foam to fill any tunnel/s and/or undermining. Change VAC dressing 3 X WEEK. Change canister as indicated when full. Nurse may titrate settings and frequency of dressing changes as clinically Palardy, Petra D. ([id number]) indicated. Wound  #6 Right,Medial Lower Leg: Wound VAC settings at 125/130 mmHg continuous pressure. Use BLACK/GREEN foam to wound cavity. Use WHITE foam to fill any tunnel/s and/or undermining. Change VAC dressing 3 X WEEK. Change canister as indicated when full. Nurse may titrate settings and frequency of dressing changes as clinically indicated. Advanced Therapies: Wound #2 Left,Distal Lower Leg: Apligraf application in clinic; including contact layer, fixation with steri strips, dry gauze and cover dressing. The patient will have the second Apligraf applied to the left lower extremity with the usual precautions, bolstered in place and will be checked for a wound change next Thursday. The right lower extremity has a couple of areas which are new anterior medial to the old wound and this has some slough. We will use Santyl over this including application of the wound VAC as planned. Due to the holiday her schedule has been changed appropriately and she will have her third Apligraf applied to the left lower extremity on December 30. Electronic Signature(s) Signed: 10/10/2015 4:32:53  PM By: [name], [name] Previous Signature: 10/10/2015 12:00:29 PM Version By: [name], [name] Entered By: [name] on 10/10/2015 16:32:53 Widener, Tayley D. ([id number]) -------------------------------------------------------------------------------- SuperBill Details Patient Name: HENRETTER, CHAKRABARTI D. Date of Service: 10/10/2015 Medical Record Patient Account Number: [id number] Number: [name], RN, [name], Treating RN: 1946-08-30 (69 y.o. 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 L03.116 Cellulitis of left lower limb Facility Procedures CPT4: Description Modifier Quantity Code [id number] (Facility Use Only) Apligraf 1 SQ CM 60 CPT4: [id number] - SKIN SUB GRAFT TRNK/ARM/LEG 1 ICD-10 Description Diagnosis E11.622 Type 2 diabetes mellitus with other skin ulcer L97.322 Non-pressure chronic ulcer of left ankle with fat layer exposed I89.0 Lymphedema, not elsewhere classified  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

## 2015-10-14 DIAGNOSIS — E11622 Type 2 diabetes mellitus with other skin ulcer: Secondary | ICD-10-CM | POA: Diagnosis not present

## 2015-10-16 ENCOUNTER — Encounter: Payer: PPO | Admitting: Surgery

## 2015-10-16 DIAGNOSIS — E11622 Type 2 diabetes mellitus with other skin ulcer: Secondary | ICD-10-CM | POA: Diagnosis not present

## 2015-10-16 NOTE — Progress Notes (Signed)
[name], [name] ([id number]) Visit Report for 10/14/2015 Arrival Information Details Patient Name: [name], [name] 10/14/2015 10:15 Date of Service: AM Medical Record [id number] Number: Patient Account Number: [id number] 1946/02/20 (69 y.o. Treating RN: [name] Date of Birth/Sex: Female) Other Clinician: Primary Care Treating [facility], Lonia Skinner, [place] Physician: Physician/Extender: Referring Physician: Glendon Axe Weeks in Treatment: 11 Visit Information History Since Last Visit Added or deleted any medications: Yes Patient Arrived: Ambulatory Any new allergies or adverse reactions: No Arrival Time: 10:00 Had a fall or experienced change in No Accompanied By: self activities of daily living that may affect Transfer Assistance: None risk of falls: Patient Identification Verified: Yes Signs or symptoms of abuse/neglect since last No Secondary Verification Process Yes visito Completed: Has Dressing in Place as Prescribed: Yes Patient Requires Transmission- No Pain Present Now: Yes Based Precautions: Patient Has Alerts: Yes Patient Alerts: Patient on Blood Thinner Electronic Signature(s) Signed: 10/15/2015 4:52:55 PM By: [name], RN, [name], [name], [name] Entered By: [name], RN, [name], Kim on 10/14/2015 10:36:06

## 2015-10-16 NOTE — Progress Notes (Signed)
JOURNEY, LOCH ([id number]) Visit Report for 10/14/2015 Physician Orders Details Patient Name: [name], [name] 10/14/2015 10:15 Date of Service: AM Medical Record [id number] Number: Patient Account Number: [id number] [birth year] (69 y.o. Treating RN: [name] Date of Birth/Sex: Female) Other Clinician: Primary Care Treating [facility], Ivan Anchors Physician: Physician/Extender: Referring Physician: Glendon Axe Weeks in Treatment: 46 Verbal / Phone Orders: Yes Clinician: [name] Read Back and Verified: Yes Diagnosis Coding Wound Cleansing Wound #5 Right,Posterior Lower Leg o Clean wound with Normal Saline. Wound #6 Right,Medial Lower Leg o Clean wound with Normal Saline. Skin Barriers/Peri-Wound Care Wound #5 Right,Posterior Lower Leg o Skin Prep Wound #6 Right,Medial Lower Leg o Skin Prep Primary Wound Dressing Wound #6 Right,Medial Lower Leg o Aquacel Ag Secondary Dressing Wound #6 Right,Medial Lower Leg o Dry Gauze Dressing Change Frequency Wound #5 Right,Posterior Lower Leg o Change Dressing Tuesday and Thursday. Wound #6 Right,Medial Lower Leg o Change Dressing Tuesday and Thursday. [name], [name] ([id number]) Follow-up Appointments Wound #5 Right,Posterior Lower Leg o Other: - Thursday for wound care visit . Wound #6 Right,Medial Lower Leg o Other: - Thursday for wound care visit . Negative Pressure Wound Therapy Wound #5 Right,Posterior Lower Leg o Wound VAC settings at 125/130 mmHg continuous pressure. Use BLACK/GREEN foam to wound cavity. Use WHITE foam to fill any tunnel/s and/or undermining. Change VAC dressing 2 X WEEK. Change canister as indicated when full. Nurse may titrate settings and frequency of dressing changes as clinically indicated. o Number of foam/gauze pieces used in the dressing = - 1 Electronic Signature(s) Signed: 10/14/2015 4:08:32 PM By: [name], [name] Signed: 10/15/2015 4:52:55 PM By:  [name] RN, [name], [name], [name] Entered By: [name], RN, [name], Kim on 10/14/2015 10:41:16 [name], [name] ([id number]) -------------------------------------------------------------------------------- SuperBill Details Patient Name: [name], [name] Date of Service: 10/14/2015 Medical Record Number: [id number] Patient Account Number: [id number] Date of Birth/Sex: [birth year] (68 y.o. 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

## 2015-10-17 NOTE — Progress Notes (Signed)
KAMAILE, BISSEY ([id number]) Visit Report for 10/16/2015 Arrival Information Details Patient Name: [name], [name] 10/16/2015 12:15 Date of Service: PM Medical Record [id number] Number: Patient Account Number: [id number] 11-06-1945 (69 y.o. Treating RN: [name] Date of Birth/Sex: Female) Other Clinician: Primary Care Treating [facility], Lonia Skinner, [place] Physician: Physician/Extender: Referring Physician: Glendon Axe Weeks in Treatment: 75 Visit Information History Since Last Visit Added or deleted any medications: No Patient Arrived: Ambulatory Any new allergies or adverse reactions: No Arrival Time: 12:23 Had a fall or experienced change in No Accompanied By: self activities of daily living that may affect Transfer Assistance: None risk of falls: Patient Identification Verified: Yes Signs or symptoms of abuse/neglect since last No Secondary Verification Process Yes visito Completed: Hospitalized since last visit: No Patient Requires Transmission- No Has Dressing in Place as Prescribed: Yes Based Precautions: Has Compression in Place as Prescribed: Yes Patient Has Alerts: Yes Pain Present Now: No Patient Alerts: Patient on Blood Thinner Electronic Signature(s) Signed: 10/16/2015 6:21:38 PM By: [name], RN, [name], [name], [name] Entered By: [name], RN, [name], Kim on 10/16/2015 12:23:59 Godshall, Tia Masker ([id number]) -------------------------------------------------------------------------------- Clinic Level of Care Assessment Details Patient Name: Kristin, [name] 10/16/2015 12:15 Date of Service: PM Medical Record [id number] Number: Patient Account Number: [id number] 1946/03/03 (68 y.o. Treating RN: [name] Date of Birth/Sex: Female) Other Clinician: Primary Care Treating [facility], Lonia Skinner, [place] Physician: Physician/Extender: Referring Physician: Glendon Axe Weeks in Treatment: 36 Clinic Level of Care Assessment Items TOOL 1 Quantity Score []  - Use  when EandM and Procedure is performed on INITIAL visit 0 ASSESSMENTS - Nursing Assessment / Reassessment []  - General Physical Exam (combine w/ comprehensive assessment (listed just 0 below) when performed on new pt. evals) []  - Comprehensive Assessment (HX, ROS, Risk Assessments, Wounds Hx, etc.) 0 ASSESSMENTS - Wound and Skin Assessment / Reassessment []  - Dermatologic / Skin Assessment (not related to wound area) 0 ASSESSMENTS - Ostomy and/or Continence Assessment and Care []  - Incontinence Assessment and Management 0 []  - Ostomy Care Assessment and Management (repouching, etc.) 0 PROCESS - Coordination of Care []  - Simple Patient / Family Education for ongoing care 0 []  - Complex (extensive) Patient / Family Education for ongoing care 0 []  - Staff obtains Programmer, systems, Records, Test Results / Process Orders 0 []  - Staff telephones HHA, Nursing Homes / Clarify orders / etc 0 []  - Routine Transfer to another Facility (non-emergent condition) 0 []  - Routine Hospital Admission (non-emergent condition) 0 []  - New Admissions / Biomedical engineer / Ordering NPWT, Apligraf, etc. 0 []  - Emergency Hospital Admission (emergent condition) 0 PROCESS - Special Needs []  - Pediatric / Minor Patient Management 0 Roadcap, Rochanda D. ([id number]) []  - Isolation Patient Management 0 []  - Hearing / Language / Visual special needs 0 []  - Assessment of Community assistance (transportation, D/C planning, etc.) 0 []  - Additional assistance / Altered mentation 0 []  - Support Surface(s) Assessment (bed, cushion, seat, etc.) 0 INTERVENTIONS - Miscellaneous []  - External ear exam 0 []  - Patient Transfer (multiple staff / Civil Service fast streamer / Similar devices) 0 []  - Simple Staple / Suture removal (25 or less) 0 []  - Complex Staple / Suture removal (26 or more) 0 []  - Hypo/Hyperglycemic Management (do not check if billed separately) 0 []  - Ankle / Brachial Index (ABI) - do not check if billed separately 0 Has the  patient been seen at the hospital within the last three years: Yes Total Score: 0 Level Of Care: ____ Electronic  Signature(s) Signed: 10/16/2015 6:21:38 PM By: [name], RN, [name], [name], [name] Entered By: [name], RN, [name], Kim on 10/16/2015 13:22:26 [place]a, SHAQUONDA STELMA ([id number]) -------------------------------------------------------------------------------- Encounter Discharge Information Details Patient Name: Kristin, [name] 10/16/2015 12:15 Date of Service: PM Medical Record [id number] Number: Patient Account Number: [id number] Mar 15, 1946 (68 y.o. Treating RN: [name] Date of Birth/Sex: Female) Other Clinician: Primary Care Treating [facility], Ivan Anchors Physician: Physician/Extender: Referring Physician: Glendon Axe Weeks in Treatment: 85 Encounter Discharge Information Items Schedule Follow-up Appointment: No Medication Reconciliation completed and provided to Patient/Care No Kairie Vangieson: Provided on Clinical Summary of Care: 10/16/2015 Form Type Recipient Paper Patient LM Electronic Signature(s) Signed: 10/16/2015 1:23:17 PM By: Ruthine Dose Entered By: Ruthine Dose on 10/16/2015 13:23:17 Banton, Alexander DMarland Kitchen ([id number]) -------------------------------------------------------------------------------- Lower Extremity Assessment Details Patient Name: Kristin, [name] 10/16/2015 12:15 Date of Service: PM Medical Record [id number] Number: Patient Account Number: [id number] 10-03-1946 (68 y.o. Treating RN: [name] Date of Birth/Sex: Female) Other Clinician: Primary Care Treating [facility], Lonia Skinner, [place] Physician: Physician/Extender: Referring Physician: Glendon Axe Weeks in Treatment: 64 Vascular Assessment Pulses: Posterior Tibial Dorsalis Pedis Palpable: [Left:Yes] [Right:Yes] Extremity colors, hair growth, and conditions: Extremity Color: [Left:Hyperpigmented] [Right:Normal] Hair Growth on Extremity: [Left:No] [Right:No] Temperature of  Extremity: [Left:Warm] [Right:Warm] Capillary Refill: [Left:< 3 seconds] [Right:< 3 seconds] Toe Nail Assessment Left: Right: Thick: No No Discolored: No No Deformed: No No Improper Length and Hygiene: No No Electronic Signature(s) Signed: 10/16/2015 6:21:38 PM By: [name], RN, [name], [name], [name] Entered By: [name], RN, [name], Kim on 10/16/2015 12:36:00 Kovalenko, Tia Masker ([id number]) -------------------------------------------------------------------------------- Multi-Disciplinary Care Plan Details Patient Name: LORENE, WARSAW 10/16/2015 12:15 Date of Service: PM Medical Record [id number] Number: Patient Account Number: [id number] November 08, 1945 (69 y.o. Treating RN: [name] Date of Birth/Sex: Female) Other Clinician: Primary Care Treating [facility], Ivan Anchors Physician: Physician/Extender: Referring Physician: Glendon Axe Weeks in Treatment: 70 Active Inactive Orientation to the Wound Care Program Nursing Diagnoses: Knowledge deficit related to the wound healing center program Goals: Patient/caregiver will verbalize understanding of the [facility] Date Initiated: 01/31/2015 Goal Status: Active Interventions: Provide education on orientation to the wound center Notes: Venous Leg Ulcer Nursing Diagnoses: Potential for venous Insuffiency (use before diagnosis confirmed) Goals: Non-invasive venous studies are completed as ordered Date Initiated: 01/31/2015 Goal Status: Active Patient/caregiver will verbalize understanding of disease process and disease management Date Initiated: 01/31/2015 Goal Status: Active Interventions: Assess peripheral edema status every visit. Notes: Wound/Skin Impairment SHARNIKA, DROGE. ([id number]) Nursing Diagnoses: Impaired tissue integrity Knowledge deficit related to smoking impact on wound healing Goals: Patient/caregiver will verbalize understanding of skin care regimen Date Initiated: 01/31/2015 Goal Status:  Active Ulcer/skin breakdown will heal within 14 weeks Date Initiated: 01/31/2015 Goal Status: Active Interventions: Assess ulceration(s) every visit Notes: Electronic Signature(s) Signed: 10/16/2015 6:21:38 PM By: [name], RN, [name], [name], [name] Entered By: [name], RN, [name], Kim on 10/16/2015 12:42:00 Artist, GIDGET SCHOEFFLER ([id number]) -------------------------------------------------------------------------------- Pain Assessment Details Patient Name: XARENI, MIRABITO D. 10/16/2015 12:15 Date of Service: PM Medical Record [id number] Number: Patient Account Number: [id number] 07/09/1946 (69 y.o. Treating RN: [name] Date of Birth/Sex: Female) Other Clinician: Primary Care Treating [facility], Ivan Anchors Physician: Physician/Extender: Referring Physician: Glendon Axe Weeks in Treatment: 33 Active Problems Location of Pain Severity and Description of Pain Patient Has Paino No Site Locations Pain Management and Medication Current Pain Management: Electronic Signature(s) Signed: 10/16/2015 6:21:38 PM By: [name], RN, [name], [name], [name] Entered By: [name], RN, [name], Kim on 10/16/2015 12:24:04 Mander,  [name] HUBBY ([id number]) -------------------------------------------------------------------------------- Wound Assessment Details Patient Name: LANDYNN, MACADAMS 10/16/2015 12:15 Date of Service: PM Medical Record [id number] Number: Patient Account Number: [id number] 10-08-1946 (68 y.o. Treating RN: [name] Date of Birth/Sex: Female) Other Clinician: Primary Care Treating [place], Lonia Skinner, [place] Physician: Physician/Extender: Referring Physician: Candiss Norse, [name] Weeks in Treatment: 36 Wound Status Wound Number: 2 Primary Venous Leg Ulcer Etiology: Wound Location: Left Lower Leg - Distal Wound Open Wounding Event: Blister Status: Date Acquired: 12/30/2014 Comorbid Cataracts, Asthma, Coronary Artery Weeks Of Treatment: 36 History: Disease, Hypertension, Type II Clustered Wound:  Yes Diabetes, Osteoarthritis, Neuropathy Photos Photo Uploaded By: [name], RN, [name], Kim on 10/16/2015 18:33:35 Wound Measurements Length: (cm) 3 Width: (cm) 4.5 Depth: (cm) 0.2 Area: (cm) 10.603 Volume: (cm) 2.121 % Reduction in Area: -1399.7% % Reduction in Volume: -2887.3% Epithelialization: None Wound Description Full Thickness Without Foul Odor After Classification: Exposed Support Structures Diabetic Severity Grade 2 (Wagner): Wound Margin: Thickened Exudate Amount: Large Exudate Type: Serous Exudate Color: amber Heaphy, Dinisha D. ([id number]) Cleansing: No Wound Bed Granulation Amount: Medium (34-66%) Exposed Structure Granulation Quality: Red, Pink Fascia Exposed: No Necrotic Amount: Medium (34-66%) Fat Layer Exposed: No Necrotic Quality: Adherent Slough Tendon Exposed: No Muscle Exposed: No Joint Exposed: No Bone Exposed: No Limited to Skin Breakdown Periwound Skin Texture Texture Color No Abnormalities Noted: No No Abnormalities Noted: No Localized Edema: Yes Temperature / Pain Scarring: Yes Temperature: No Abnormality Moisture Tenderness on Palpation: Yes No Abnormalities Noted: No Maceration: Yes Moist: Yes Wound Preparation Ulcer Cleansing: Rinsed/Irrigated with Saline Assessment Notes I week post apilgraf. used measurements form previous visit. Electronic Signature(s) Signed: 10/16/2015 6:21:38 PM By: [name], RN, [name], [name], [name] Entered By: [name], RN, [name], Kim on 10/16/2015 12:37:57 Porco, TERRYLYNN SHUGARTS ([id number]) -------------------------------------------------------------------------------- Wound Assessment Details Patient Name: VINCENZA, STGERMAINE 10/16/2015 12:15 Date of Service: PM Medical Record [id number] Number: Patient Account Number: [id number] April 01, 1946 (68 y.o. Treating RN: [name] Date of Birth/Sex: Female) Other Clinician: Primary Care Treating [place], Lonia Skinner, [place] Physician: Physician/Extender: Referring  Physician: Candiss Norse, [name] Weeks in Treatment: 36 Wound Status Wound Number: 5 Primary Venous Leg Ulcer Etiology: Wound Location: Right Lower Leg - Posterior Wound Open Wounding Event: Gradually Appeared Status: Date Acquired: 04/29/2015 Comorbid Cataracts, Asthma, Coronary Artery Weeks Of Treatment: 21 History: Disease, Hypertension, Type II Clustered Wound: No Diabetes, Osteoarthritis, Neuropathy Photos Photo Uploaded By: [name], RN, [name], Kim on 10/16/2015 18:33:35 Wound Measurements Length: (cm) 5.5 Width: (cm) 7 Depth: (cm) 0.6 Area: (cm) 30.238 Volume: (cm) 18.143 % Reduction in Area: -15984% % Reduction in Volume: -95389.5% Epithelialization: None Wound Description Full Thickness Without Exposed Foul Odor Classification: Support Structures Diabetic Severity Grade 2 (Wagner): Wound Margin: Thickened Exudate Amount: Large Exudate Type: Serosanguineous Exudate Color: red, brown Muhlbauer, Sherion D. ([id number]) After Cleansing: No Wound Bed Granulation Large (67-100%) Amount: Granulation Red, Pink, Hyper-granulation, Quality: Friable Necrotic Amount: Small (1-33%) Necrotic Quality: Adherent Slough Periwound Skin Texture Texture Color No Abnormalities Noted: No No Abnormalities Noted: No Localized Edema: Yes Temperature / Pain Scarring: Yes Temperature: No Abnormality Moisture Tenderness on Palpation: Yes No Abnormalities Noted: No Moist: Yes Wound Preparation Ulcer Cleansing: Rinsed/Irrigated with Saline Topical Anesthetic Applied: Other: lidocaine 4%, Electronic Signature(s) Signed: 10/16/2015 6:21:38 PM By: [name], RN, [name], [name], [name] Entered By: [name], RN, [name], Kim on 10/16/2015 12:38:32 Wiemann, AALANAH DEXHEIMER ([id number]) -------------------------------------------------------------------------------- Wound Assessment Details Patient Name: TIRSA, SABIR 10/16/2015 12:15 Date of Service: PM Medical Record [id number] Number: Patient Account Number:  [id number] 1946/06/06 (69 y.o.  Treating RN: [name] Date of Birth/Sex: Female) Other Clinician: Primary Care Treating [facility], Lonia Skinner, [place] Physician: Physician/Extender: Referring Physician: Candiss Norse, [name] Weeks in Treatment: 36 Wound Status Wound Number: 6 Primary Diabetic Wound/Ulcer of the Lower Etiology: Extremity Wound Location: Right Lower Leg - Medial Wound Open Wounding Event: Gradually Appeared Status: Date Acquired: 10/10/2015 Comorbid Cataracts, Asthma, Coronary Artery Weeks Of Treatment: 0 History: Disease, Hypertension, Type II Clustered Wound: No Diabetes, Osteoarthritis, Neuropathy Photos Photo Uploaded By: [name], RN, [name], Kim on 10/16/2015 18:33:36 Wound Measurements Length: (cm) 2.2 Width: (cm) 2 Depth: (cm) 0.1 Area: (cm) 3.456 Volume: (cm) 0.346 % Reduction in Area: 0% % Reduction in Volume: 0% Epithelialization: None Wound Description Classification: Unable to visualize wound bed Takemoto, Jenness D. ([id number]) Foul Odor After Cleansing: No Wound Margin: Distinct, outline attached Exudate Amount: Medium Exudate Type: Serosanguineous Exudate Color: red, brown Wound Bed Granulation Amount: None Present (0%) Exposed Structure Necrotic Amount: Large (67-100%) Fascia Exposed: No Necrotic Quality: Eschar, Adherent Slough Fat Layer Exposed: No Tendon Exposed: No Muscle Exposed: No Joint Exposed: No Bone Exposed: No Limited to Skin Breakdown Periwound Skin Texture Texture Color No Abnormalities Noted: No No Abnormalities Noted: No Callus: No Atrophie Blanche: No Crepitus: No Cyanosis: No Excoriation: No Ecchymosis: No Fluctuance: No Erythema: No Friable: No Hemosiderin Staining: No Induration: No Mottled: No Localized Edema: No Pallor: No Rash: No Rubor: No Scarring: No Temperature / Pain Moisture Temperature: No Abnormality No Abnormalities Noted: No Tenderness on Palpation: Yes Dry / Scaly: No Maceration: No Moist:  Yes Wound Preparation Ulcer Cleansing: Rinsed/Irrigated with Saline Topical Anesthetic Applied: Other: lidocaine 4%, Electronic Signature(s) Signed: 10/16/2015 6:21:38 PM By: [name], RN, [name], [name], [name] Entered By: [name], RN, [name], Kim on 10/16/2015 12:38:52 Teti, CEDRA ETSITTY ([id number]) -------------------------------------------------------------------------------- Vitals Details Patient Name: EIBHLIN, GORNIK 10/16/2015 12:15 Date of Service: PM Medical Record [id number] Number: Patient Account Number: [id number] 1946-09-04 (68 y.o. Treating RN: [name] Date of Birth/Sex: Female) Other Clinician: Primary Care Treating [place], [place], [place] Physician: Physician/Extender: Referring Physician: Candiss Norse, [name] Weeks in Treatment: 36 Vital Signs Time Taken: 12:24 Temperature (F): 97.6 Height (in): 65 Pulse (bpm): 78 Weight (lbs): 248 Respiratory Rate (breaths/min): 18 Body Mass Index (BMI): 41.3 Blood Pressure (mmHg): 147/66 Reference Range: 80 - 120 mg / dl Electronic Signature(s) Signed: 10/16/2015 6:21:38 PM By: [name], RN, [name], [name], [name] Entered By: [name], RN, [name], Kim on 10/16/2015 12:27:32

## 2015-10-17 NOTE — Progress Notes (Addendum)
JULIANNA, ZENGEL ([id number]) Visit Report for 10/16/2015 Chief Complaint Document Details Patient Name: [name], [name] 10/16/2015 12:15 Date of Service: PM Medical Record [id number] Number: Patient Account Number: [id number] [birth year] (69 y.o. Treating RN: [name] Date of Birth/Sex: Female) Other Clinician: Primary Care Treating [facility], Ivan Anchors Physician: Physician/Extender: Referring Physician: Glendon Axe Weeks in Treatment: 5 Information Obtained from: Patient Chief Complaint Patient presents to the wound care center for a consult due non healing wound 69 year old patient comes with a history of having a ulcer on the left lower extremity for the past 4 weeks. she says she's had swelling of both lower extremities for about a year after she started having prednisone. 02/07/2015 -- her vascular appointments obtained were in the first and third week of June. she is able to go to [place] and we will try and get her some earlier appointments. Other than that nothing else has changed in her management. Electronic Signature(s) Signed: 10/16/2015 12:53:18 PM By: [name], [name] Entered By: [name] on 10/16/2015 12:53:18 Skilling, KESHUNA HIGH ([id number]) -------------------------------------------------------------------------------- HPI Details Patient Name: JOSSALIN, KNECHTEL D. 10/16/2015 12:15 Date of Service: PM Medical Record [id number] Number: Patient Account Number: [id number] 1946-06-22 (69 y.o. Treating RN: [name] Date of Birth/Sex: Female) Other Clinician: Primary Care Treating [facility], Lonia Skinner, Jacub Waiters Physician: Physician/Extender: Referring Physician: Glendon Axe Weeks in Treatment: 64 History of Present Illness HPI Description: 69 year old patient who is known to have diabetes mellitus type 2, chronic renal insufficiency, coronary artery disease, hypertension, hypercholesterolemia, temporal arteritis and inflammatory  arthritiss also has a history of having a hysterectomy and some orthopedic related surgeries. The ulcer on the left lower extremity started off as a blister and then. Got progressively worse. She does not have any fever or chills and has not had any recent surgical intervention for this. Her last hemoglobin A1c was 10.1 in September 2015. She has been recently put on doxycycline by her PCP. She is now also allergic to doxycycline and was this was changed over to Keflex. 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 She does see a cardiologist and also takes a diuretic. 02/07/2015 her arterial and venous duplex studies to be done have dates been given as the first and third week of June. This is at [facility]. We are going to try and get early appointments at [facility]. other than that nothing has changed in her management. 02/14/2015 -- we have been able to get her an appointment in [facility] on May 20 which is much earlier than her previous ones at [place]. She continues with her prednisone and her sugars are in the range of 150-200. 02/21/2015 We were able to get a vascular lab workup for her today and she is going to be there at 2:00 this afternoon. the swelling of her leg has gone down significantly but she still has some tenderness over the wounds. 02/28/2015 - She has had one of two vascular workups done, and this coming Tuesday has another, at [place] [facility]. She continues to be on steroid medications. She has significant sensitivity in her left lower extremity and has pain suggestive of neuropathic pain and I have asked her to address this with her primary care physician. 03/07/2015 -- The patient saw Dr. Lucky Cowboy for a consultation and he has had her arteries are okay but she has 2 incompetent veins on the  left lower extremity and he is going to set her up  for surgery. Official report is awaited. Addendum: Official reports are now available and on 03/04/2015. She was seen and lower extremity venous duplex exam was done. There was reflux present within the left greater saphenous vein below the knee and also the left small saphenous vein. Arterial duplex showed normal triphasic waveforms throughout the left lower extremity without any significant stenosis. Her ABIs were noncompressible bilaterally but a waveforms were normal and a digital pressures were normal bilaterally consistent with no significant arterial insufficiency. He has recommended endovenous ablation of both the left small saphenous and the left great saphenous vein. This would still be scheduled later. Kristin, [name] ([id number]) 03/14/2015 -- she has heard back from the vascular office and has surgery scheduled for sometime in July. Her rheumatologist has decreased her prednisone dosage but she still on it. She has also had cataract surgery in her right eye recently this week. 03/20/2015 - No new complaints today. Pain improved. No fever or chills. Tolerating 2 layer compression. 04/14/2015 -- she was doing very well today she went off on vacation and now her edema has increased markedly the ulceration is bigger and her diabetes is not under control. 04/21/2015 -- I spoke to her PCP Dr. Candiss Norse and discussed the management which would include being seen by a general surgeon for debridement and taking multiple punch biopsies which would help in establishing the diagnosis of this is a vasculitis. She is agreeable about this and will set her up for the procedure with Dr. Tamala Julian at [facility]. She was seen by the surgeon Dr. Jamal Collin. 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 She is scheduled to have laser ablation done next week.Pt was sent here for possible biopsy to  look for vasculitis. Feel it would be better to wait after laser ablation is completed- the ulcer may heal fully and biopsy may not be necessary 04/29/2015 -- she had the venous ablation done by Dr. Lucky Cowboy last Friday and we do not have any notes yet. She is doing fine otherwise. 05/06/2015 --Review of her recent vascular intervention shows that she was seen by Dr. Lucky Cowboy on 04/29/2015. The follow-up duplex which was done showed that both the great saphenous vein and the small saphenous vein remained patent with reflux consistent with an unsuccessful ablation. He has rescheduled her for another the endovenous ablation to be done in about 4 weekso time. 05/13/2015 -- he was seen by her surgeon Dr. Jamal Collin who asked her to continue with conservative therapy and he would speak to Dr. Lucky Cowboy about her management. Dr. Lucky Cowboy is going to schedule her surgery in the middle of August for a repeat endovenous ablation. Her pus culture from last week has grown : [place] her noted her sensitivity report but due to her multiple allergies I had tried clindamycin and she developed a rash with this too. She has been prescribed and anti-buttocks in the ER and is has it at home and she will let is know what she is going to be taking. 05/20/2015 -- she has developed a small spot on her right lower extremity but besides that it is not a full fledged ulceration. She did not get to see Dr. Lucky Cowboy last week and hopefully she will see him in the near future. 05/27/2015 -she is still awaiting her  appointment with Dr.Dew and her vascular procedure is not scheduled until August 19. She will be seeing her PCP tomorrow and I have asked her to convey our discussion so that she is aware that debridement has not been done yet. 06/03/2015 -- was seen by her rheumatologist Dr. Dorthula Matas, who has been treating her for temporal  arteritis and in his note has mentioned the possibility of vasculitis or pyoderma gangrenosum. He is lowering her prednisone to 12-1/2 mg for 1 month and then 10 mg per the next month. I will again make an attempt to speak to her PCP Dr. Candiss Norse and her surgeon Dr. 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 06/17/2015 -- Dr.Dew did her vascular procedure last week and a follow-up venous ultrasound shows good Burditt, Jammie D. ([id number]) resolution of the veins as per the patient's history. He is to see her back in 2 weeks. 07/07/2015. -- the patient has had a heavy growth of Proteus mirabilis and Enterococcus faecalis. These are sensitive to several drugs but the problem is she has allergies to all of these and hence I would like her to see Dr. Ola Spurr for this. She is also due to see Dr. Lucky Cowboy tomorrow and I will discussed the management with him including debridement under anesthesia and possible biopsies. 07/14/2015 -- she has an appointment to see Dr. Ola Spurr tomorrow and did see Dr. Bunnie Domino PA who will discuss my request with him. 07/21/2015 -- saw Dr. Ola Spurr was able to do a test on her and has put her on amoxicillin. She has been tolerating that and has had no problems with allergies to this. 07/28/2015 -- Last Friday I spoke to Dr. Leotis Pain regarding her care and he said that her right leg did not need any surgery and on the left leg was doing pretty good. We did agree that if she undergoes any procedure in the future he would do a couple of punch biopsies of the wound. 08/18/2015 -- her right leg is very tender and there is significant amount of slough. The left leg is looking much better 09/02/2015 -- she is going to have a debridement and punch biopsies of her right lower extremity by Dr. Leotis Pain this coming Thursday. Also seen Dr. Ola Spurr who has continued her on  ciprofloxacin. 09/09/2015 -- on 09/04/2015 Dr. Leotis Pain took her to surgery - Irrigation and excisional debridement of skin, soft tissue, and muscle to about 40 cm2 to the right posterior calf with biopsy. Pathology results are -- DIAGNOSIS: SKIN, RIGHT LOWER EXTREMITY; BIOPSY: SKIN AND SOFT TISSUE WITH ULCERATION, SEE NOTE. - NEGATIVE FOR DYSPLASIA AND MALIGNANCY. Note: There is acute inflammation and ulceration of the epidermis. The dermis shows nonspecific inflammation, neovascularization, and hemosiderin deposition. The differential diagnosis for these findings includes stasis dermatitis, nonspecific dermatitis, and infection. Correlation with clinical findings is required. A PAS fungal stain is obtained and results will be reported in an addendum. cultures were also sent and this grew Pseudomonas aeruginosa, Escherichia coli, Proteus mirabilis, Klebsiella pneumoniae and they were all sensitive to ciprofloxacin which she is on. 09/16/2015 -- he saw Dr. 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 He is going to call the patient in and discuss with her the management possibly with Imuran. I will continue treating  her locally. 09/23/2015 -- she saw Dr. Ola Spurr today who was going to continue the antibiotics for now and stop after this course. She has an appointment to see Dr. Jefm Bryant in about a week's time. Her wound VAC has arrived but she did not bring it with her today. We will try and set her up for changes to the right leg 3 times a week. She is here for her first application of Apligraf to the left lower extremity. 09/30/2015 -- she has seen Dr. 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 because she is ambulatory at home health will not apply a wound VAC and she will have  to come here 3 times a week on Monday Wednesday and Friday. 10/10/2015 -- she is here for a second application of Apligraf to her left lower extremity. 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 Jefm Bryant. Other than that she is doing well ALDINA, KROLAK ([id number]) Electronic Signature(s) Signed: 10/16/2015 12:54:18 PM By: [name], [name] Entered By: [name] on 10/16/2015 12:54:18 Skolnik, ADAMARIZ VENEGAS ([id number]) -------------------------------------------------------------------------------- Physical Exam Details Patient Name: TANMAYI, PENDLEY D. 10/16/2015 12:15 Date of Service: PM Medical Record [id number] Number: Patient Account Number: [id number] [birth year] (69 y.o. Treating RN: [name] Date of Birth/Sex: Female) Other Clinician: Primary Care Treating [facility], Ivan Anchors Physician: Physician/Extender: Referring Physician: Candiss Norse, [name] Weeks in Treatment: 36 Constitutional . Pulse regular. Respirations normal and unlabored. Afebrile. . Eyes Nonicteric. Reactive to light. Ears, Nose, Mouth, and Throat Lips, teeth, and gums WNL.Marland Kitchen Moist mucosa without lesions. Neck supple and nontender. No palpable supraclavicular or cervical adenopathy. Normal sized without goiter. Respiratory WNL. No retractions.. Breath sounds WNL, No rubs, rales, rhonchi, or wheeze.. Cardiovascular Heart rhythm and rate regular, no murmur or gallop.. Pedal Pulses WNL. No clubbing, cyanosis or edema. Lymphatic No adneopathy. No adenopathy. No adenopathy. Musculoskeletal Adexa without tenderness or enlargement.. Digits and nails w/o clubbing, cyanosis, infection, petechiae, ischemia, or inflammatory conditions.. Integumentary (Hair, Skin) No suspicious lesions. No crepitus or fluctuance. No peri-wound warmth or erythema. No masses.Marland Kitchen Psychiatric Judgement and insight Intact.. No evidence of depression, anxiety, or  agitation.. Notes the left lower extremity wound is covered with the Apligraf dressing and looks clean. The right lower extremity continues to have slough at the edges and we are going to use Santyl ointment under the wound VAC. Electronic Signature(s) Signed: 10/16/2015 12:55:26 PM By: [name], [name] Previous Signature: 10/16/2015 12:55:09 PM Version By: [name], [name] Entered By: [name] on 10/16/2015 12:55:26 Bard, RADINE HUI ([id number]) -------------------------------------------------------------------------------- Physician Orders Details Patient Name: ELEESHA, CRICHTON D. 10/16/2015 12:15 Date of Service: PM Medical Record [id number] Number: Patient Account Number: [id number] April 11, 1946 (70 y.o. Treating RN: [name] Date of Birth/Sex: Female) Other Clinician: Primary Care Treating [facility], Ivan Anchors Physician: Physician/Extender: Referring Physician: Glendon Axe Weeks in Treatment: 73 Verbal / Phone Orders: Yes Clinician: [name] Read Back and Verified: Yes Diagnosis Coding Wound Cleansing Wound #5 Right,Posterior Lower Leg o Clean wound with Normal Saline. Wound #6 Right,Medial Lower Leg o Clean wound with Normal Saline. Skin Barriers/Peri-Wound Care Wound #2 Left,Distal Lower Leg o Skin Prep Wound #5 Right,Posterior Lower Leg o Skin Prep Wound #6 Right,Medial Lower Leg o Skin Prep Primary Wound Dressing Wound #6 Right,Medial Lower Leg o Aquacel Ag Wound #5 Right,Posterior Lower Leg o Santyl Ointment - around edges of  wound Wound #2 Left,Distal Lower Leg o Drawtex Secondary Dressing Wound #6 Right,Medial Lower Leg o Dry Gauze Dressing Change Frequency Wound #5 Right,Posterior Lower Leg Vath, Zilah D. ([id number]) o Change Dressing Tuesday and Thursday. Wound #6 Right,Medial Lower Leg o Change Dressing Tuesday and Thursday. o Change dressing every week Follow-up Appointments Wound #2  Left,Distal Lower Leg o Return Appointment in 1 week. o Other: - Thursday for wound care visit . Wound #5 Right,Posterior Lower Leg o Return Appointment in 1 week. o Other: - Thursday for wound care visit . Wound #6 Right,Medial Lower Leg o Return Appointment in 1 week. o Other: - Thursday for wound care visit . Negative Pressure Wound Therapy Wound #5 Right,Posterior Lower Leg o Wound VAC settings at 125/130 mmHg continuous pressure. Use BLACK/GREEN foam to wound cavity. Use WHITE foam to fill any tunnel/s and/or undermining. Change VAC dressing 2 X WEEK. Change canister as indicated when full. Nurse may titrate settings and frequency of dressing changes as clinically indicated. o Number of foam/gauze pieces used in the dressing = - 1 Electronic Signature(s) Signed: 10/16/2015 5:05:23 PM By: [name], [name] Signed: 10/16/2015 6:21:38 PM By: [name] RN, [name], [name], [name] Entered By: [name], RN, [name], Kim on 10/16/2015 12:52:55 Geisler, RENNE PITA ([id number]) -------------------------------------------------------------------------------- Problem List Details Patient Name: KEIRAN, SANDVIK 10/16/2015 12:15 Date of Service: PM Medical Record [id number] Number: Patient Account Number: [id number] 10/23/1946 (69 y.o. 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 ([id number]) Entered By: [name] on 10/16/2015 12:52:54 Kvamme, Jenille D. ([id number]) -------------------------------------------------------------------------------- Progress Note Details Patient Name: SINAI, HAMIDEH D. 10/16/2015 12:15 Date of Service: PM Medical Record [id number] Number: Patient Account Number: [id number] Oct 14, 1946 (69 y.o. Treating RN: [name] Date of Birth/Sex: Female) Other Clinician: Primary Care Treating [facility], Ivan Anchors Physician: Physician/Extender: Referring Physician: Glendon Axe Weeks in Treatment: 36 Subjective Chief Complaint Information obtained from Patient Patient presents to the wound care center for a consult due non healing wound 69 year old patient comes with a history of having a ulcer on the left lower extremity for the past 4 weeks. she says she's had swelling of both lower extremities for about a year after she started having prednisone. 02/07/2015 -- her vascular appointments obtained were in the first and third week of June. she is able to go to [place] and we will try and get her some earlier appointments. Other than that nothing else has changed in her management. History of Present Illness (HPI) 69 year old patient who is known to have diabetes mellitus type 2, chronic renal insufficiency, coronary artery disease, hypertension, hypercholesterolemia, temporal arteritis and inflammatory arthritiss also has a history of having a hysterectomy and some orthopedic related surgeries. The ulcer on the left lower extremity started off as a  blister and then. Got progressively worse. She does not have any fever or chills  and has not had any recent surgical intervention for this. Her last hemoglobin A1c was 10.1 in September 2015. She has been recently put on doxycycline by her PCP. She is now also allergic to doxycycline and was this was changed over to Keflex. 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 She does see a cardiologist and also takes a diuretic. 02/07/2015 her arterial and venous duplex studies to be done have dates been given as the first and third week of June. This is at [facility]. We are going to try and get early appointments at [facility]. other than that nothing has changed in her management. 02/14/2015 -- we have been able to get her an appointment in [facility] on May 20 which is much earlier than her previous ones at [place]. She continues with her prednisone and her sugars are in the range of 150-200. 02/21/2015 We were able to get a vascular lab workup for her today and she is going to be there at 2:00 this afternoon. the swelling of her leg has gone down significantly but she still has some tenderness over the wounds. 02/28/2015 - She has had one of two vascular workups done, and this coming Tuesday has another, at [place] [facility]. She continues to be on steroid medications. She has significant sensitivity in her left lower extremity and has pain suggestive of neuropathic pain and I have asked her to address this with her primary care physician. 03/07/2015 -- The patient saw Dr. Lucky Cowboy for a consultation and he has had her arteries are okay but she has Brasil, Narissa D. ([id number]) 2 incompetent veins on the left lower extremity and he is going to set her up for surgery. Official report is awaited. Addendum: Official reports are now available and on 03/04/2015. She was seen and lower extremity venous duplex exam was done. There was reflux present within the  left greater saphenous vein below the knee and also the left small saphenous vein. Arterial duplex showed normal triphasic waveforms throughout the left lower extremity without any significant stenosis. Her ABIs were noncompressible bilaterally but a waveforms were normal and a digital pressures were normal bilaterally consistent with no significant arterial insufficiency. He has recommended endovenous ablation of both the left small saphenous and the left great saphenous vein. This would still be scheduled later. 03/14/2015 -- she has heard back from the vascular office and has surgery scheduled for sometime in July. Her rheumatologist has decreased her prednisone dosage but she still on it. She has also had cataract surgery in her right eye recently this week. 03/20/2015 - No new complaints today. Pain improved. No fever or chills. Tolerating 2 layer compression. 04/14/2015 -- she was doing very well today she went off on vacation and now her edema has increased markedly the ulceration is bigger and her diabetes is not under control. 04/21/2015 -- I spoke to her PCP Dr. Candiss Norse and discussed the management which would include being seen by a general surgeon for debridement and taking multiple punch biopsies which would help in establishing the diagnosis of this is a vasculitis. She is agreeable about this and will set her up for the procedure with Dr. Tamala Julian at [facility]. She was seen by the surgeon Dr. Jamal Collin. 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 She is scheduled to have laser ablation done next week.Pt was sent here for possible biopsy to look for vasculitis. Feel it would be better to wait after laser ablation is completed- the ulcer may heal fully and biopsy may not be necessary 04/29/2015 -- she had the venous ablation done by Dr. Lucky Cowboy last Friday and we do not have  any notes yet. She is doing fine otherwise. 05/06/2015 --Review of her recent vascular intervention shows that she was seen by Dr. Lucky Cowboy on 04/29/2015. The follow-up duplex which was done showed that both the great saphenous vein and the small saphenous vein remained patent with reflux consistent with an unsuccessful ablation. He has rescheduled her for another the endovenous ablation to be done in about 4 weeks time. 05/13/2015 -- he was seen by her surgeon Dr. Jamal Collin who asked her to continue with conservative therapy and he would speak to Dr. Lucky Cowboy about her management. Dr. Lucky Cowboy is going to schedule her surgery in the middle of August for a repeat endovenous ablation. Her pus culture from last week has grown : [place] her noted her sensitivity report but due to her multiple allergies I had tried clindamycin and she developed a rash with this too. She has been prescribed and anti-buttocks in the ER and is has it at home and she will let is know what she is going to be taking. 05/20/2015 -- she has developed a small spot on her right lower extremity but besides that it is not a full fledged ulceration. She did not get to see Dr. Lucky Cowboy last week and hopefully she will see him in the near future. 05/27/2015 -she is still awaiting her appointment with Dr.Dew and her vascular procedure is not scheduled until August 19. She will be seeing her PCP tomorrow and I have asked her to convey our discussion so Ricketts, Leauna D. ([id number]) that she is aware that debridement has not been done yet. 06/03/2015 -- was seen by her rheumatologist Dr. 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 He is lowering her prednisone to 12-1/2 mg for 1 month and then 10 mg per the next month. I will again make an  attempt to speak to her PCP Dr. Candiss Norse and her surgeon Dr. 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 06/17/2015 -- Dr.Dew did her vascular procedure last week and a follow-up venous ultrasound shows good resolution of the veins as per the patient's history. He is to see her back in 2 weeks. 07/07/2015. -- the patient has had a heavy growth of Proteus mirabilis and Enterococcus faecalis. These are sensitive to several drugs but the problem is she has allergies to all of these and hence I would like her to see Dr. Ola Spurr for this. She is also due to see Dr. Lucky Cowboy tomorrow and I will discussed the management with him including debridement under anesthesia and possible biopsies. 07/14/2015 -- she has an appointment to see Dr. Ola Spurr tomorrow and did see Dr. Bunnie Domino PA who will discuss my request with him. 07/21/2015 -- saw Dr. Ola Spurr was able to do a test on her and has put her on amoxicillin. She has been tolerating that  and has had no problems with allergies to this. 07/28/2015 -- Last Friday I spoke to Dr. Leotis Pain regarding her care and he said that her right leg did not need any surgery and on the left leg was doing pretty good. We did agree that if she undergoes any procedure in the future he would do a couple of punch biopsies of the wound. 08/18/2015 -- her right leg is very tender and there is significant amount of slough. The left leg is looking much better 09/02/2015 -- she is going to have a debridement and punch biopsies of her right lower extremity by Dr. Leotis Pain this coming Thursday. Also seen Dr. Ola Spurr who has continued her on ciprofloxacin. 09/09/2015 -- on 09/04/2015 Dr. Leotis Pain took her to surgery - Irrigation and excisional debridement of skin, soft tissue, and muscle to about 40 cm2 to the right posterior calf with biopsy. Pathology results are -- DIAGNOSIS:  SKIN, RIGHT LOWER EXTREMITY; BIOPSY: SKIN AND SOFT TISSUE WITH ULCERATION, SEE NOTE. - NEGATIVE FOR DYSPLASIA AND MALIGNANCY. Note: There is acute inflammation and ulceration of the epidermis. The dermis shows nonspecific inflammation, neovascularization, and hemosiderin deposition. The differential diagnosis for these findings includes stasis dermatitis, nonspecific dermatitis, and infection. Correlation with clinical findings is required. A PAS fungal stain is obtained and results will be reported in an addendum. cultures were also sent and this grew Pseudomonas aeruginosa, Escherichia coli, Proteus mirabilis, Klebsiella pneumoniae and they were all sensitive to ciprofloxacin which she is on. 09/16/2015 -- he saw Dr. 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 He is going to call the patient in and discuss with her the management possibly with Imuran. I will continue treating her locally. 09/23/2015 -- she saw Dr. Ola Spurr today who was going to continue the antibiotics for now and stop after this course. She has an appointment to see Dr. Jefm Bryant in about a week's time. Her wound VAC has arrived but she did not bring it with her today. We will try and set her up for changes to the right leg 3 times a week. She is here for her first application of Apligraf to the left lower extremity. KHYLEIGH, MUGRAGE ([id number]) 09/30/2015 -- she has seen Dr. 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 because she is ambulatory at home health will not apply a wound VAC and she will have to come here 3 times a week on Monday Wednesday and Friday. 10/10/2015 -- she is here for a second application of Apligraf to her left lower extremity. 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 Jefm Bryant. Other than that she is doing well Objective Constitutional Pulse regular. Respirations normal and unlabored. Afebrile. Vitals Time Taken: 12:24 PM, Height: 65 in, Weight: 248 lbs, BMI: 41.3, Temperature: 97.6 F, Pulse: 78 bpm, Respiratory Rate: 18 breaths/min, Blood Pressure: 147/66 mmHg. Eyes Nonicteric. Reactive to light. Ears, Nose, Mouth, and Throat Lips, teeth, and gums WNL.Marland Kitchen Moist mucosa without lesions. Neck supple and nontender. No palpable supraclavicular or cervical adenopathy. Normal sized without goiter. Respiratory WNL. No retractions.. Breath sounds WNL, No rubs, rales, rhonchi, or wheeze.. Cardiovascular Heart rhythm and rate regular, no murmur or gallop.. Pedal Pulses WNL. No clubbing, cyanosis or edema. Lymphatic No adneopathy. No adenopathy. No  adenopathy. Musculoskeletal Adexa without tenderness or enlargement.. Digits and nails w/o clubbing, cyanosis, infection, petechiae, ischemia, or inflammatory conditions.Marland Kitchen Psychiatric Judgement and insight Intact.. No evidence of depression, anxiety, or agitation.. General Notes: the left lower extremity wound is covered with the Apligraf dressing and looks clean. The NOVICE, VILLARROEL ([id number]) right lower extremity continues to have slough at the edges and we are going to use Santyl ointment under the wound VAC. Integumentary (Hair, Skin) No suspicious lesions. No crepitus or fluctuance. No peri-wound warmth or erythema. No masses.. Wound #2 status is Open. Original cause of wound was Blister. The wound is located on the Left,Distal Lower Leg. The wound measures 3cm length x 4.5cm width x 0.2cm depth; 10.603cm^2 area and 2.121cm^3 volume. The wound is limited to skin breakdown. There is a large amount of serous drainage noted. The wound margin is thickened. There is medium (34-66%) red, pink granulation within the wound bed. There is a medium (34-66%) amount of  necrotic tissue within the wound bed including Adherent Slough. The periwound skin appearance exhibited: Localized Edema, Scarring, Maceration, Moist. Periwound temperature was noted as No Abnormality. The periwound has tenderness on palpation. General Notes: I week post apilgraf. used measurements form previous visit. Wound #5 status is Open. Original cause of wound was Gradually Appeared. The wound is located on the Right,Posterior Lower Leg. The wound measures 5.5cm length x 7cm width x 0.6cm depth; 30.238cm^2 area and 18.143cm^3 volume. There is a large amount of serosanguineous drainage noted. The wound margin is thickened. There is large (67-100%) red, pink, friable granulation within the wound bed. There is a small (1-33%) amount of necrotic tissue within the wound bed including Adherent Slough. The periwound skin appearance exhibited: Localized Edema, Scarring, Moist. Periwound temperature was noted as No Abnormality. The periwound has tenderness on palpation. Wound #6 status is Open. Original cause of wound was Gradually Appeared. The wound is located on the Right,Medial Lower Leg. The wound measures 2.2cm length x 2cm width x 0.1cm depth; 3.456cm^2 area and 0.346cm^3 volume. The wound is limited to skin breakdown. There is a medium amount of serosanguineous drainage noted. The wound margin is distinct with the outline attached to the wound base. There is no granulation within the wound bed. There is a large (67-100%) amount of necrotic tissue within the wound bed including Eschar and Adherent Slough. The periwound skin appearance exhibited: Moist. The periwound skin appearance did not exhibit: Callus, Crepitus, Excoriation, Fluctuance, Friable, Induration, Localized Edema, Rash, Scarring, Dry/Scaly, Maceration, Atrophie Blanche, Cyanosis, Ecchymosis, Hemosiderin Staining, Mottled, Pallor, Rubor, Erythema. Periwound temperature was noted as No Abnormality. The periwound has tenderness on  palpation. 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 ([id number]) The patient has had the second Apligraf applied to the left lower extremity and today was a wound check for this. The right lower extremity has a couple of areas which are new anterior medial to the old wound and this has some slough. We will use Santyl over this including application of the wound VAC as planned. Due to the holiday her schedule has been  changed appropriately and she will have her third Apligraf applied to the left lower extremity delayed. Plan Wound Cleansing: Wound #5 Right,Posterior Lower Leg: Clean wound with Normal Saline. Wound #6 Right,Medial Lower Leg: Clean wound with Normal Saline. Skin Barriers/Peri-Wound Care: Wound #2 Left,Distal Lower Leg: Skin Prep Wound #5 Right,Posterior Lower Leg: Skin Prep Wound #6 Right,Medial Lower Leg: Skin Prep Primary Wound Dressing: Wound #6 Right,Medial Lower Leg: Aquacel Ag Wound #5 Right,Posterior Lower Leg: Santyl Ointment - around edges of wound Wound #2 Left,Distal Lower Leg: Drawtex Secondary Dressing: Wound #6 Right,Medial Lower Leg: Dry Gauze Dressing Change Frequency: Wound #5 Right,Posterior Lower Leg: Change Dressing Tuesday and Thursday. Wound #6 Right,Medial Lower Leg: Change Dressing Tuesday and Thursday. Change dressing every week Follow-up Appointments: Wound #2 Left,Distal Lower Leg: Return Appointment in 1 week. GLADIE, FOGAL ([id number]) Other: - Thursday for wound care  visit . Wound #5 Right,Posterior Lower Leg: Return Appointment in 1 week. Other: - Thursday for wound care visit . Wound #6 Right,Medial Lower Leg: Return Appointment in 1 week. Other: - Thursday for wound care visit . Negative Pressure Wound Therapy: Wound #5 Right,Posterior Lower Leg: Wound VAC settings at 125/130 mmHg continuous pressure. Use BLACK/GREEN foam to wound cavity. Use WHITE foam to fill any tunnel/s and/or undermining. Change VAC dressing 2 X WEEK. Change canister as indicated when full. Nurse may titrate settings and frequency of dressing changes as clinically indicated. Number of foam/gauze pieces used in the dressing = - 1 The patient has had the second Apligraf applied to the left lower extremity and today was a wound check for this. The right lower extremity has a couple of areas which are new anterior medial to the old wound and this has some slough. We will use Santyl over this including application of the wound VAC as planned. Due to the holiday her schedule has been changed appropriately and she will have her third Apligraf applied to the left lower extremity delayed. Electronic Signature(s) Signed: 10/16/2015 12:56:50 PM By: [name], [name] Entered By: [name] on 10/16/2015 12:56:50 Bauch, Kimbrely D. ([id number]) -------------------------------------------------------------------------------- SuperBill Details Patient Name: AUNGELIQUE, DECLARK D. Date of Service: 10/16/2015 Medical Record Number: [id number] Patient Account Number: [id number] Date of Birth/Sex: [birth year] (68 y.o. 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 L03.116 Cellulitis of left lower limb Facility Procedures CPT4 Code: [id number] Description: [id number] NEG PRESS WND TX <=50 SQ CM Modifier: Quantity: 1 Physician Procedures CPT4: Description Modifier Quantity Code [id number] - WC PHYS LEVEL 3 - EST PT 1 ICD-10 Description Diagnosis E11.622 Type 2 diabetes mellitus with other skin ulcer L97.322 Non-pressure chronic ulcer of left ankle with fat layer exposed I89.0  Lymphedema, not elsewhere classified 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

## 2015-10-21 DIAGNOSIS — E11622 Type 2 diabetes mellitus with other skin ulcer: Secondary | ICD-10-CM | POA: Diagnosis not present

## 2015-10-22 NOTE — Progress Notes (Signed)
MARLETA, AGOSTINO ([id number]) Visit Report for 10/21/2015 Arrival Information Details Patient Name: Kristin, [name] 10/21/2015 10:45 Date of Service: AM Medical Record [id number] Number: Patient Account Number: [id number] [birth year] (69 y.o. Treating RN: Baruch Gouty, RN, [name], [name] Date of Birth/Sex: Female) Other Clinician: Primary Care Treating [place], Elisha Ponder, [place] Physician: Physician/Extender: Referring Physician: Glendon Axe Weeks in Treatment: 40 Visit Information History Since Last Visit Any new allergies or adverse reactions: No Patient Arrived: Ambulatory Had a fall or experienced change in No Arrival Time: 11:04 activities of daily living that may affect Accompanied By: self risk of falls: Transfer Assistance: None Signs or symptoms of abuse/neglect since last No Patient Identification Verified: Yes visito Secondary Verification Process Yes Has Dressing in Place as Prescribed: Yes Completed: Pain Present Now: No Patient Requires Transmission- No Based Precautions: Patient Has Alerts: Yes Patient Alerts: Patient on Blood Thinner Electronic Signature(s) Signed: 10/21/2015 5:06:20 PM By: Regan Lemming [name], RN Entered By: Regan Lemming on 10/21/2015 11:04:56 [name], [name] ([id number]) -------------------------------------------------------------------------------- Encounter Discharge Information Details Patient Name: [name], [name] 10/21/2015 10:45 Date of Service: AM Medical Record [id number] Number: Patient Account Number: [id number] Dec 13, 1945 (69 y.o. Treating RN: Baruch Gouty, RN, [name], [name] Date of Birth/Sex: Female) Other Clinician: Primary Care Treating [place], Elisha Ponder, [place] Physician: Physician/Extender: Referring Physician: Glendon Axe Weeks in Treatment: 47 Encounter Discharge Information Items Discharge Pain Level: 0 Discharge Condition: Stable Ambulatory Status: Ambulatory Discharge Destination:  Home Private Transportation: Auto Accompanied By: self Schedule Follow-up Appointment: No Medication Reconciliation completed and No provided to Patient/Care [name]: Clinical Summary of Care: Electronic Signature(s) Signed: 10/21/2015 5:06:20 PM By: Regan Lemming [name], RN Entered By: Regan Lemming on 10/21/2015 11:06:34 Griffeth, [name] ([id number]) -------------------------------------------------------------------------------- Patient/Caregiver Education Details Patient Name: [name], [name] 10/21/2015 10:45 Date of Service: AM Medical Record [id number] Number: Patient Account Number: [id number] 05-26-1946 (69 y.o. Treating RN: Baruch Gouty, RN, [name], [name] Date of Birth/Gender: Female) Other Clinician: Primary Care Treating [place], Elisha Ponder, [place] Physician: Physician/Extender: Referring Physician: Donnetta Hutching in Treatment: 66 Education Assessment Education Provided To: Patient Education Topics Provided Welcome To [facility]: Methods: Explain/Verbal Electronic Signature(s) Signed: 10/21/2015 5:06:20 PM By: Regan Lemming [name], RN Entered By: Regan Lemming on 10/21/2015 11:06:06

## 2015-10-24 ENCOUNTER — Encounter (HOSPITAL_BASED_OUTPATIENT_CLINIC_OR_DEPARTMENT_OTHER): Payer: PPO | Admitting: General Surgery

## 2015-10-24 ENCOUNTER — Encounter: Payer: Self-pay | Admitting: General Surgery

## 2015-10-24 DIAGNOSIS — L97929 Non-pressure chronic ulcer of unspecified part of left lower leg with unspecified severity: Principal | ICD-10-CM

## 2015-10-24 DIAGNOSIS — I87313 Chronic venous hypertension (idiopathic) with ulcer of bilateral lower extremity: Secondary | ICD-10-CM

## 2015-10-24 DIAGNOSIS — E11622 Type 2 diabetes mellitus with other skin ulcer: Secondary | ICD-10-CM | POA: Diagnosis not present

## 2015-10-24 DIAGNOSIS — L97919 Non-pressure chronic ulcer of unspecified part of right lower leg with unspecified severity: Principal | ICD-10-CM

## 2015-10-24 NOTE — Progress Notes (Signed)
seeiheal 

## 2015-10-28 NOTE — Progress Notes (Addendum)
LONYA, SCHRENK ([id number]) Visit Report for 10/24/2015 Chief Complaint Document Details Patient Name: [name], [name] 10/24/2015 8:45 Date of Service: AM Medical Record [id number] Number: Patient Account Number: [id number] Jul 14, 1946 (70 y.o. [name], RN, [name], Date of Birth/Sex: Treating RN: Female) Administrator, sports Primary Care Physician: Glendon Axe Other Clinician: Referring Physician: Danny Lawless, Kemiyah Tarazon Physician/Extender: Weeks in Treatment: 43 Information Obtained from: Patient Chief Complaint Patient presents to the wound care center for a consult due non healing wound 71 year old patient comes with a history of having a ulcer on the left lower extremity for the past 4 weeks. she says she's had swelling of both lower extremities for about a year after she started having prednisone. 02/07/2015 -- her vascular appointments obtained were in the first and third week of June. she is able to go to [place] and we will try and get her some earlier appointments. Other than that nothing else has changed in her management. Electronic Signature(s) Signed: 10/29/2015 1:56:29 PM By: Judene Companion MD Previous Signature: 10/24/2015 8:44:52 AM Version By: Judene Companion MD Previous Signature: 10/28/2015 8:08:04 AM Version By: [name], [name] Entered By: Judene Companion on 10/29/2015 13:56:29 [place], [name] ([id number]) -------------------------------------------------------------------------------- HPI Details Patient Name: [name], [name] 10/24/2015 8:45 Date of Service: AM Medical Record [id number] Number: Patient Account Number: [id number] [birth year] (70 y.o. [name], RN, [name], Date of Birth/Sex: Treating RN: Female) Administrator, sports Primary Care Physician: Glendon Axe Other Clinician: Referring Physician: Danny Lawless, Kyera Felan Physician/Extender: Weeks in Treatment: 71 History of Present Illness HPI Description: 70 year old patient who is known to have  diabetes mellitus type 2, chronic renal insufficiency, coronary artery disease, hypertension, hypercholesterolemia, temporal arteritis and inflammatory arthritiss also has a history of having a hysterectomy and some orthopedic related surgeries. The ulcer on the left lower extremity started off as a blister and then. Got progressively worse. She does not have any fever or chills and has not had any recent surgical intervention for this. Her last hemoglobin A1c was 10.1 in September 2015. She has been recently put on doxycycline by her PCP. She is now also allergic to doxycycline and was this was changed over to Keflex. 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 She does see a cardiologist and also takes a diuretic. 02/07/2015 her arterial and venous duplex studies to be done have dates been given as the first and third week of June. This is at [facility]. We are going to try and get early appointments at [facility]. other than that nothing has changed in her management. 02/14/2015 -- we have been able to get her an appointment in [facility] on May 20 which is much earlier than her previous ones at [place]. She continues with her prednisone and her sugars are in the range of 150-200. 02/21/2015 We were able to get a vascular lab workup for her today and she is going to be there at 2:00 this afternoon. the swelling of her leg has gone down significantly but she still has some tenderness over the wounds. 02/28/2015 - She has had one of two vascular workups done, and this coming Tuesday has another, at [facility]. She continues to be on steroid medications. She has significant sensitivity in her left lower extremity and has pain suggestive of neuropathic pain and I have asked her to address this with her primary care physician. 03/07/2015 --  The patient saw Dr.  Lucky Cowboy for a consultation and he has had her arteries are okay but she has 2 incompetent veins on the left lower extremity and he is going to set her up for surgery. Official report is awaited. Addendum: Official reports are now available and on 03/04/2015. She was seen and lower extremity venous duplex exam was done. There was reflux present within the left greater saphenous vein below the knee and also the left small saphenous vein. Arterial duplex showed normal triphasic waveforms throughout the left lower extremity without any significant stenosis. Her ABIs were noncompressible bilaterally but a waveforms were normal and a digital pressures were normal bilaterally consistent with no significant arterial insufficiency. He has recommended endovenous ablation of both the left small saphenous and the left great saphenous vein. This would still be scheduled later. 03/14/2015 -- she has heard back from the vascular office and has surgery scheduled for sometime in July. [name], [name] ([id number]) Her rheumatologist has decreased her prednisone dosage but she still on it. She has also had cataract surgery in her right eye recently this week. 03/20/2015 - No new complaints today. Pain improved. No fever or chills. Tolerating 2 layer compression. 04/14/2015 -- she was doing very well today she went off on vacation and now her edema has increased markedly the ulceration is bigger and her diabetes is not under control. 04/21/2015 -- I spoke to her PCP Dr. Candiss Norse and discussed the management which would include being seen by a general surgeon for debridement and taking multiple punch biopsies which would help in establishing the diagnosis of this is a vasculitis. She is agreeable about this and will set her up for the procedure with Dr. Tamala Julian at [facility]. She was seen by the surgeon Dr. Jamal Collin. 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 She is scheduled to have laser ablation done next week.Pt was sent here for possible biopsy to look for vasculitis. Feel it would be better to wait after laser ablation is completed- the ulcer may heal fully and biopsy may not be necessary 04/29/2015 -- she had the venous ablation done by Dr. Lucky Cowboy last Friday and we do not have any notes yet. She is doing fine otherwise. 05/06/2015 --Review of her recent vascular intervention shows that she was seen by Dr. Lucky Cowboy on 04/29/2015. The follow-up duplex which was done showed that both the great saphenous vein and the small saphenous vein remained patent with reflux consistent with an unsuccessful ablation. He has rescheduled her for another the endovenous ablation to be done in about 4 weekso time. 05/13/2015 -- he was seen by her surgeon Dr. Jamal Collin who asked her to continue with conservative therapy and he would speak to Dr. Lucky Cowboy about her management. Dr. Lucky Cowboy is going to schedule her surgery in the middle of August for a repeat endovenous ablation. Her pus culture from last week has grown : [place] her noted her sensitivity report but due to her multiple allergies I had tried clindamycin and she developed a rash with this too. She has been prescribed and anti-buttocks in the ER and is has it at home and she will let is know what she is going to be taking. 05/20/2015 -- she has developed a small spot on her right lower extremity but besides that it is not a full fledged ulceration. She did  not get to see Dr. Lucky Cowboy last week and hopefully she will see him in the near future. 05/27/2015 -she is still awaiting her appointment with Dr.Dew and her vascular procedure is not scheduled until August 19. She will be seeing her PCP tomorrow and I have asked her to convey our discussion so that she is aware that  debridement has not been done yet. 06/03/2015 -- was seen by her rheumatologist Dr. 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 He is lowering her prednisone to 12-1/2 mg for 1 month and then 10 mg per the next month. I will again make an attempt to speak to her PCP Dr. Candiss Norse and her surgeon Dr. 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 06/17/2015 -- Dr.Dew did her vascular procedure last week and a follow-up venous ultrasound shows good resolution of the veins as per the patient's history. He is to see her back in 2 weeks. [name], [name] ([id number]) 07/07/2015. -- the patient has had a heavy growth of Proteus mirabilis and Enterococcus faecalis. These are sensitive to several drugs but the problem is she has allergies to all of these and hence I would like her to see Dr. Ola Spurr for this. She is also due to see Dr. Lucky Cowboy tomorrow and I will discussed the management with him including debridement under anesthesia and possible biopsies. 07/14/2015 -- she has an appointment to see Dr. Ola Spurr tomorrow and did see Dr. Bunnie Domino PA who will discuss my request with him. 07/21/2015 -- saw Dr. Ola Spurr was able to do a test on her and has put her on amoxicillin. She has been tolerating that and has had no problems with allergies to this. 07/28/2015 -- Last Friday I spoke to Dr. Leotis Pain regarding her care and he said that her right leg did not need any surgery and on the left leg was doing pretty good. We did agree that if she undergoes any procedure in the future he would do a couple of punch biopsies of the wound. 08/18/2015 -- her right leg is very tender and there is significant amount of slough. The left leg is looking much better 09/02/2015 -- she is going to have a  debridement and punch biopsies of her right lower extremity by Dr. Leotis Pain this coming Thursday. Also seen Dr. Ola Spurr who has continued her on ciprofloxacin. 09/09/2015 -- on 09/04/2015 Dr. Leotis Pain took her to surgery - Irrigation and excisional debridement of skin, soft tissue, and muscle to about 40 cm2 to the right posterior calf with biopsy. Pathology results are -- DIAGNOSIS: SKIN, RIGHT LOWER EXTREMITY; BIOPSY: SKIN AND SOFT TISSUE WITH ULCERATION, SEE NOTE. - NEGATIVE FOR DYSPLASIA AND MALIGNANCY. Note: There is acute inflammation and ulceration of the epidermis. The dermis shows nonspecific inflammation, neovascularization, and hemosiderin deposition. The differential diagnosis for these findings includes stasis dermatitis, nonspecific dermatitis, and infection. Correlation with clinical findings is required. A PAS fungal stain is obtained and results will be reported in an addendum. cultures were also sent and this grew Pseudomonas aeruginosa, Escherichia coli, Proteus mirabilis, Klebsiella pneumoniae and they were all sensitive to ciprofloxacin which she is on. 09/16/2015 -- he saw Dr. 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 He is going to call the patient in and discuss with her the management possibly with Imuran. I will continue treating her locally. 09/23/2015 -- she saw Dr. Ola Spurr today who was going to continue the antibiotics for now and stop after this course. She has an appointment to see Dr. Jefm Bryant in about a week's time. Her wound VAC has arrived but she did not bring it with her today. We will try and set her up for changes to the right leg 3 times a week. She is here for her first application of Apligraf to the left lower extremity. 09/30/2015 -- she has seen Dr. 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 because she is ambulatory at home health will not apply a wound VAC and she will have to come here 3 times a week on Monday Wednesday and Friday. 10/10/2015 -- she is here for a second application of Apligraf to her left lower extremity. 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 Jefm Bryant. Other than that she is doing well Electronic Signature(s) DESHONNA, MARCANO ([id number]) Signed: 10/29/2015 1:56:48 PM By: Judene Companion MD Previous Signature: 10/24/2015 8:48:38 AM Version By: Judene Companion MD Previous Signature: 10/28/2015 8:08:04 AM Version By: [name], [name] Entered By: Judene Companion on 10/29/2015 13:56:47 Mermelstein, JOELLE VANDERZWAAG ([id number]) -------------------------------------------------------------------------------- Physical Exam Details Patient Name: [name], [name] 10/24/2015 8:45 Date of Service: AM Medical Record [id number] Number: Patient Account Number: [id number] December 15, 1945 (70 y.o. [name], RN, [name], Date of Birth/Sex: Treating RN: Female) Administrator, sports Primary Care Physician: Glendon Axe Other Clinician: Referring Physician: Danny Lawless, [place] Droll Physician/Extender: Weeks in Treatment: 38 Electronic Signature(s) Signed: 10/29/2015 1:53:27 PM By: Judene Companion MD Previous Signature: 10/24/2015 8:49:01 AM Version By: Judene Companion MD Previous Signature: 10/28/2015 8:08:04 AM Version By: [name], [name] Entered By: Judene Companion on 10/29/2015 13:53:26 Ryans, [name] ([id number]) -------------------------------------------------------------------------------- Physician Orders Details Patient Name: [name], [name] 10/24/2015 8:45 Date of Service: AM Medical Record [id number] Number: Patient Account Number: [id number] [birth year] (70 y.o. 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 Wound #6 Right,Medial Lower Leg o Clean wound with Normal Saline. Wound #2 Left,Distal Lower Leg o Cleanse wound with mild soap and water o May shower with protection. Skin Barriers/Peri-Wound Care Wound #2 Left,Distal Lower Leg o Skin Prep Wound #5 Right,Posterior Lower Leg o Skin Prep Wound #6 Right,Medial Lower Leg o Skin Prep Primary Wound Dressing Wound #2 Left,Distal Lower Leg Axe, Camren D. ([id number]) o Other: - sorbact Wound #5 Right,Posterior Lower Leg o Santyl Ointment - around edges of wound Wound #6 Right,Medial Lower Leg o Santyl Ointment - around edges of  wound o Drawtex Secondary Dressing Wound #6 Right,Medial Lower Leg o Dry Gauze o Drawtex Dressing Change Frequency Wound #5 Right,Posterior Lower Leg o Change Dressing Tuesday and Thursday. Wound #6 Right,Medial Lower Leg o Change dressing every week o Change Dressing Tuesday and Thursday. Follow-up Appointments Wound #2 Left,Distal Lower Leg o Return Appointment in 1 week. o Other: - Thursday for wound care visit . Wound #5 Right,Posterior Lower Leg o Return Appointment in 1  week. Wound #6 Right,Medial Lower Leg o Return Appointment in 1 week. Edema Control Wound #2 Left,Distal Lower Leg o 2 Layer Lite Compression System - Left Lower Extremity Negative Pressure Wound Therapy Wound #5 Right,Posterior Lower Leg o Wound VAC settings at 125/130 mmHg continuous pressure. Use BLACK/GREEN foam to wound cavity. Use WHITE foam to fill any tunnel/s and/or undermining. Change VAC dressing 2 X WEEK. Change canister as indicated when full. Nurse may titrate settings and frequency of dressing changes as clinically indicated. o Number of foam/gauze pieces used in the dressing = - 1 Electronic Signature(s) [name], [name] ([id number]) Signed: 10/24/2015 12:59:18 PM By: Regan Lemming [name], RN Signed: 10/28/2015 8:08:04 AM By: [name], [name] Entered By: Regan Lemming on 10/24/2015 08:58:45 [place], [name] ([id number]) -------------------------------------------------------------------------------- Problem List Details Patient Name: [name], [name] 10/24/2015 8:45 Date of Service: AM Medical Record [id number] Number: Patient Account Number: [id number] 07-11-1946 (70 y.o. 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 with both ulcer of 03/07/2015 Yes calf and inflammation I83.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 with both ulcer of 03/07/2015 Yes ankle and inflammation L03.116 Cellulitis of  left lower limb 07/07/2015 Yes Inactive Problems Resolved Problems Electronic Signature(s) Signed: 10/29/2015 1:55:40 PM By: Judene Companion MD Previous Signature: 10/24/2015 8:44:22 AM Version By: Judene Companion MD Previous Signature: 10/28/2015 8:08:04 AM Version By: [name], [name] [name], [name] ([id number]) Entered By: Judene Companion on 10/29/2015 13:55:39 [place], Zyah DMarland Kitchen ([id number]) -------------------------------------------------------------------------------- Progress Note Details Patient Name: [name], AQUIRRE D. 10/24/2015 8:45 Date of Service: AM Medical Record [id number] Number: Patient Account Number: [id number] 1946/01/19 (70 y.o. [name], RN, [name], Date of Birth/Sex: Treating RN: Female) Administrator, sports Primary Care Physician: Glendon Axe Other Clinician: Referring Physician: Glendon Axe Treating Britto, Errol Physician/Extender: Weeks in Treatment: 59 Subjective Chief Complaint Information obtained from Patient Patient presents to the wound care center for a consult due non healing wound 70 year old patient comes with a history of having a ulcer on the left lower extremity for the past 4 weeks. she says she's had swelling of both lower extremities for about a year after she started having prednisone. 02/07/2015 -- her vascular appointments obtained were in the first and third week of June. she is able to go to [place] and we will try and get her some earlier appointments. Other than that nothing else has changed in her management. History of Present Illness (HPI) 70 year old patient who is known to have diabetes mellitus type 2, chronic renal insufficiency, coronary artery disease, hypertension, hypercholesterolemia, temporal arteritis and inflammatory arthritiss also has a history of having a hysterectomy  and some orthopedic related surgeries. The ulcer on the left lower extremity started off as a blister and then. Got progressively worse. She does not have any fever or  chills and has not had any recent surgical intervention for this. Her last hemoglobin A1c was 10.1 in September 2015. She has been recently put on doxycycline by her PCP. She is now also allergic to doxycycline and was this was changed over to Keflex. 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 She does see a cardiologist and also takes a diuretic. 02/07/2015 her arterial and venous duplex studies to be done have dates been given as the first and third week of June. This is at [facility]. We are going to try and get early appointments at [facility]. other than that nothing has changed in her management. 02/14/2015 -- we have been able to get her an appointment in [facility] on May 20 which is much earlier than her previous ones at [place]. She continues with her prednisone and her sugars are in the range of 150-200. 02/21/2015 We were able to get a vascular lab workup for her today and she is going to be there at 2:00 this afternoon. the swelling of her leg has gone down significantly but she still has some tenderness over the wounds. 02/28/2015 - She has had one of two vascular workups done, and this coming Tuesday has another, at [facility]. She continues to be on steroid medications. She has significant sensitivity in her left lower extremity and has pain suggestive of neuropathic pain and I have asked her to address this with her primary care physician. 03/07/2015 -- The patient saw Dr. Lucky Cowboy for a consultation and he has had her arteries are okay but she has 2 incompetent veins on the left lower extremity and he is going to set her up for surgery. Official report is LIANE, CARUCCI ([id number]) awaited. Addendum: Official reports are now available and on 03/04/2015. She was seen and lower extremity venous duplex exam was done. There was reflux present  within the left greater saphenous vein below the knee and also the left small saphenous vein. Arterial duplex showed normal triphasic waveforms throughout the left lower extremity without any significant stenosis. Her ABIs were noncompressible bilaterally but a waveforms were normal and a digital pressures were normal bilaterally consistent with no significant arterial insufficiency. He has recommended endovenous ablation of both the left small saphenous and the left great saphenous vein. This would still be scheduled later. 03/14/2015 -- she has heard back from the vascular office and has surgery scheduled for sometime in July. Her rheumatologist has decreased her prednisone dosage but she still on it. She has also had cataract surgery in her right eye recently this week. 03/20/2015 - No new complaints today. Pain improved. No fever or chills. Tolerating 2 layer compression. 04/14/2015 -- she was doing very well today she went off on vacation and now her edema has increased markedly the ulceration is bigger and her diabetes is not under control. 04/21/2015 -- I spoke to her PCP Dr. Candiss Norse and discussed the management which would include being seen by a general surgeon for debridement and taking multiple punch biopsies which would help in establishing the diagnosis of this is a vasculitis. She is agreeable about this and will set her up for the procedure with Dr. Tamala Julian at [facility]. She was seen by the surgeon Dr. Jamal Collin. 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 She is scheduled to have laser ablation done next week.Pt was sent here for possible biopsy to look for vasculitis. Feel it would be better to wait after laser ablation is completed- the ulcer may heal fully and biopsy may not be necessary 04/29/2015 -- she had the venous ablation done by Dr. Lucky Cowboy last Friday and we  do not have any notes yet. She is doing fine otherwise. 05/06/2015 --Review of her recent vascular intervention shows that she was seen by Dr. Lucky Cowboy on 04/29/2015. The follow-up duplex which was done showed that both the great saphenous vein and the small saphenous vein remained patent with reflux consistent with an unsuccessful ablation. He has rescheduled her for another the endovenous ablation to be done in about 4 weeks time. 05/13/2015 -- he was seen by her surgeon Dr. Jamal Collin who asked her to continue with conservative therapy and he would speak to Dr. Lucky Cowboy about her management. Dr. Lucky Cowboy is going to schedule her surgery in the middle of August for a repeat endovenous ablation. Her pus culture from last week has grown : [place] her noted her sensitivity report but due to her multiple allergies I had tried clindamycin and she developed a rash with this too. She has been prescribed and anti-buttocks in the ER and is has it at home and she will let is know what she is going to be taking. 05/20/2015 -- she has developed a small spot on her right lower extremity but besides that it is not a full fledged ulceration. She did not get to see Dr. Lucky Cowboy last week and hopefully she will see him in the near future. 05/27/2015 -she is still awaiting her appointment with Dr.Dew and her vascular procedure is not scheduled until August 19. She will be seeing her PCP tomorrow and I have asked her to convey our discussion so that she is aware that debridement has not been done yet. SHAKYLA, GEMMELL ([id number]) 06/03/2015 -- was seen by her rheumatologist Dr. 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 He is lowering her prednisone to 12-1/2 mg for 1 month and then 10 mg per the next month. I will again  make an attempt to speak to her PCP Dr. Candiss Norse and her surgeon Dr. 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 06/17/2015 -- Dr.Dew did her vascular procedure last week and a follow-up venous ultrasound shows good resolution of the veins as per the patient's history. He is to see her back in 2 weeks. 07/07/2015. -- the patient has had a heavy growth of Proteus mirabilis and Enterococcus faecalis. These are sensitive to several drugs but the problem is she has allergies to all of these and hence I would like her to see Dr. Ola Spurr for this. She is also due to see Dr. Lucky Cowboy tomorrow and I will discussed the management with him including debridement under anesthesia and possible biopsies. 07/14/2015 -- she has an appointment to see Dr. Ola Spurr tomorrow and did see Dr. Bunnie Domino PA who will discuss my request with him. 07/21/2015 -- saw Dr. Ola Spurr was able to  do a test on her and has put her on amoxicillin. She has been tolerating that and has had no problems with allergies to this. 07/28/2015 -- Last Friday I spoke to Dr. Leotis Pain regarding her care and he said that her right leg did not need any surgery and on the left leg was doing pretty good. We did agree that if she undergoes any procedure in the future he would do a couple of punch biopsies of the wound. 08/18/2015 -- her right leg is very tender and there is significant amount of slough. The left leg is looking much better 09/02/2015 -- she is going to have a debridement and punch biopsies of her right lower extremity by Dr. Leotis Pain this coming Thursday. Also seen Dr. Ola Spurr who has continued her on ciprofloxacin. 09/09/2015 -- on 09/04/2015 Dr. Leotis Pain took her to surgery - Irrigation and excisional debridement of skin, soft tissue, and muscle to about 40 cm2 to the right posterior calf with biopsy. Pathology results are --  DIAGNOSIS: SKIN, RIGHT LOWER EXTREMITY; BIOPSY: SKIN AND SOFT TISSUE WITH ULCERATION, SEE NOTE. - NEGATIVE FOR DYSPLASIA AND MALIGNANCY. Note: There is acute inflammation and ulceration of the epidermis. The dermis shows nonspecific inflammation, neovascularization, and hemosiderin deposition. The differential diagnosis for these findings includes stasis dermatitis, nonspecific dermatitis, and infection. Correlation with clinical findings is required. A PAS fungal stain is obtained and results will be reported in an addendum. cultures were also sent and this grew Pseudomonas aeruginosa, Escherichia coli, Proteus mirabilis, Klebsiella pneumoniae and they were all sensitive to ciprofloxacin which she is on. 09/16/2015 -- he saw Dr. 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 He is going to call the patient in and discuss with her the management possibly with Imuran. I will continue treating her locally. 09/23/2015 -- she saw Dr. Ola Spurr today who was going to continue the antibiotics for now and stop after this course. She has an appointment to see Dr. Jefm Bryant in about a week's time. Her wound VAC has arrived but she did not bring it with her today. We will try and set her up for changes to the right leg 3 times a week. She is here for her first application of Apligraf to the left lower extremity. MARLON, OVER ([id number]) 09/30/2015 -- she has seen Dr. 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 because she is ambulatory at home health will not apply a wound VAC and she will have to come here 3 times a week on Monday Wednesday and Friday. 10/10/2015 -- she is here for a second application of Apligraf to her left lower extremity. 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 Jefm Bryant. Other than that she is doing well Objective Constitutional Vitals Time Taken: 8:34 AM, Height: 65 in, Weight: 248 lbs, BMI: 41.3, Temperature: 97.3 F, Pulse: 88 bpm, Respiratory Rate: 20 breaths/min, Blood Pressure: 178/79 mmHg. Integumentary (Hair, Skin) Wound #2 status is Open. Original cause of wound was Blister. The wound is located on the Left,Distal Lower Leg. The wound measures 3.4cm length x 4cm width x 0.1cm depth; 10.681cm^2 area and 1.068cm^3 volume. Wound #5 status is Open. Original cause of wound was Gradually Appeared. The wound is located on the Right,Posterior Lower Leg. The  wound measures 5.5cm length x 9cm width x 0.4cm depth; 38.877cm^2 area and 15.551cm^3 volume. Wound #6 status is Open. Original cause of wound was Gradually Appeared. The wound is located on the Right,Medial Lower Leg. The wound measures 3cm length x 3.5cm width x 0.1cm depth; 8.247cm^2 area and 0.825cm^3 volume. 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 ([id number]) Plan Patient refused debridement. Has bilateral ulcers.. Right being treated with vac. Left Apligraf and compression. Right has necrotic tissue that needs debridement. Electronic Signature(s) Signed: 10/24/2015 8:57:14 AM By: Judene Companion MD Signed: 10/28/2015 8:08:04 AM By: [name], [name] Entered By: Judene Companion on 10/24/2015 08:57:14 [place], Anabell D.  ([id number]) -------------------------------------------------------------------------------- SuperBill Details Patient Name: JEYDA, EDGEMAN D. Date of Service: 10/24/2015 Medical Record Patient Account Number: [id number] Number: [name], RN, [name], Treating RN: 07/31/1946 (70 y.o. 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 L03.116 Cellulitis of left lower limb Facility Procedures CPT4 Code: [id number] Description: [place] VISIT-LEV 3 EST PT Modifier: Quantity: 1 CPT4 Code: [id number] Description: [id number] - WOUND VAC-50 SQ CM OR LESS Modifier: Quantity: 1 Physician Procedures CPT4: Description Modifier Quantity Code [id number] - WC PHYS LEVEL 3 - EST PT 1 ICD-10 Description Diagnosis 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

## 2015-10-28 NOTE — Progress Notes (Addendum)
VICTORINE, GOPAL ([id number]) Visit Report for 10/24/2015 Arrival Information Details Patient Name: [name], [name] Date of Service: 10/24/2015 8:45 AM Medical Record Number: [id number] Patient Account Number: [id number] Date of Birth/Sex: 1946-02-14 (70 y.o. Female) Treating RN: [name], RN, [name], [name] Primary Care Physician: Glendon Axe Other Clinician: Referring Physician: Glendon Axe Treating Physician/Extender: Benjaman Pott in Treatment: 38 Visit Information History Since Last Visit Added or deleted any medications: No Patient Arrived: Ambulatory Any new allergies or adverse reactions: No Arrival Time: 08:30 Had a fall or experienced change in No Accompanied By: self activities of daily living that may affect Transfer Assistance: None risk of falls: Patient Identification Verified: Yes Signs or symptoms of abuse/neglect since last No Secondary Verification Process Yes visito Completed: Hospitalized since last visit: No Patient Requires Transmission- No Has Dressing in Place as Prescribed: Yes Based Precautions: Pain Present Now: Yes Patient Has Alerts: Yes Patient Alerts: Patient on Blood Thinner Electronic Signature(s) Signed: 10/24/2015 12:59:18 PM By: Regan Lemming [name], RN Entered By: Regan Lemming on 10/24/2015 08:31:47 Byrd, Tiphanie D. ([id number]) -------------------------------------------------------------------------------- Clinic Level of Care Assessment Details Patient Name: [name] Date of Service: 10/24/2015 8:45 AM Medical Record Number: [id number] Patient Account Number: [id number] Date of Birth/Sex: 30-Dec-1945 (70 y.o. Female) Treating RN: [name], RN, [name], [employer] Primary Care Physician: Glendon Axe Other Clinician: Referring Physician: Glendon Axe Treating Physician/Extender: Benjaman Pott in Treatment: 38 Clinic Level of Care Assessment Items TOOL 4 Quantity Score []  - Use when only an EandM is performed on FOLLOW-UP visit  0 ASSESSMENTS - Nursing Assessment / Reassessment X - Reassessment of Co-morbidities (includes updates in patient status) 1 10 X - Reassessment of Adherence to Treatment Plan 1 5 ASSESSMENTS - Wound and Skin Assessment / Reassessment []  - Simple Wound Assessment / Reassessment - one wound 0 X - Complex Wound Assessment / Reassessment - multiple wounds 3 5 []  - Dermatologic / Skin Assessment (not related to wound area) 0 ASSESSMENTS - Focused Assessment []  - Circumferential Edema Measurements - multi extremities 0 []  - Nutritional Assessment / Counseling / Intervention 0 X - Lower Extremity Assessment (monofilament, tuning fork, pulses) 1 5 []  - Peripheral Arterial Disease Assessment (using hand held doppler) 0 ASSESSMENTS - Ostomy and/or Continence Assessment and Care []  - Incontinence Assessment and Management 0 []  - Ostomy Care Assessment and Management (repouching, etc.) 0 PROCESS - Coordination of Care []  - Simple Patient / Family Education for ongoing care 0 []  - Complex (extensive) Patient / Family Education for ongoing care 0 []  - Staff obtains Programmer, systems, Records, Test Results / Process Orders 0 []  - Staff telephones HHA, Nursing Homes / Clarify orders / etc 0 []  - Routine Transfer to another Facility (non-emergent condition) 0 Peral, Lamesha D. ([id number]) []  - Routine Hospital Admission (non-emergent condition) 0 []  - New Admissions / Biomedical engineer / Ordering NPWT, Apligraf, etc. 0 []  - Emergency Hospital Admission (emergent condition) 0 []  - Simple Discharge Coordination 0 []  - Complex (extensive) Discharge Coordination 0 PROCESS - Special Needs []  - Pediatric / Minor Patient Management 0 []  - Isolation Patient Management 0 []  - Hearing / Language / Visual special needs 0 []  - Assessment of Community assistance (transportation, D/C planning, etc.) 0 []  - Additional assistance / Altered mentation 0 []  - Support Surface(s) Assessment (bed, cushion, seat, etc.)  0 INTERVENTIONS - Wound Cleansing / Measurement []  - Simple Wound Cleansing - one wound 0 X - Complex Wound Cleansing - multiple wounds 3 5 []  - Wound  Imaging (photographs - any number of wounds) 0 []  - Wound Tracing (instead of photographs) 0 []  - Simple Wound Measurement - one wound 0 X - Complex Wound Measurement - multiple wounds 3 5 INTERVENTIONS - Wound Dressings []  - Small Wound Dressing one or multiple wounds 0 X - Medium Wound Dressing one or multiple wounds 3 15 []  - Large Wound Dressing one or multiple wounds 0 []  - Application of Medications - topical 0 []  - Application of Medications - injection 0 INTERVENTIONS - Miscellaneous []  - External ear exam 0 Gernert, Astryd D. ([id number]) []  - Specimen Collection (cultures, biopsies, blood, body fluids, etc.) 0 []  - Specimen(s) / Culture(s) sent or taken to Lab for analysis 0 []  - Patient Transfer (multiple staff / [name]) 0 []  - Simple Staple / Suture removal (25 or less) 0 []  - Complex Staple / Suture removal (26 or more) 0 []  - Hypo / Hyperglycemic Management (close monitor of Blood Glucose) 0 []  - Ankle / Brachial Index (ABI) - do not check if billed separately 0 X - Vital Signs 1 5 Has the patient been seen at the hospital within the last three years: Yes Total Score: 115 Level Of Care: New/Established - Level 3 Electronic Signature(s) Signed: 10/30/2015 5:43:46 PM By: [name] Previous Signature: 10/24/2015 12:59:18 PM Version By: Regan Lemming [name], RN Entered By: [name] on 10/30/2015 17:43:46 Barcelo, Erlinda D. ([id number]) -------------------------------------------------------------------------------- Encounter Discharge Information Details Patient Name: AVEONA, GREENLIEF D. Date of Service: 10/24/2015 8:45 AM Medical Record Number: [id number] Patient Account Number: [id number] Date of Birth/Sex: [birth year] (70 y.o. Female) Treating RN: Baruch Gouty, RN, [name], [name] Primary Care Physician: Glendon Axe Other Clinician: Referring Physician: Glendon Axe Treating Physician/Extender: Benjaman Pott in Treatment: 43 Encounter Discharge Information Items Discharge Pain Level: 0 Discharge Condition: Stable Ambulatory Status: Cane Discharge Destination: Home Transportation: Private Auto Accompanied By: self Schedule Follow-up Appointment: No Medication Reconciliation completed and provided to Patient/Care No Avonda Toso: Provided on Clinical Summary of Care: 10/24/2015 Form Type Recipient Paper Patient LM Electronic Signature(s) Signed: 10/30/2015 5:44:35 PM By: [name] Previous Signature: 10/24/2015 9:28:49 AM Version By: Sharon Mt Previous Signature: 10/24/2015 8:58:17 AM Version By: Judene Companion MD Entered By: [name] on 10/30/2015 17:44:35 Uehara, Leilana D. ([id number]) -------------------------------------------------------------------------------- Lower Extremity Assessment Details Patient Name: Herard, Zuley D. Date of Service: 10/24/2015 8:45 AM Medical Record Number: [id number] Patient Account Number: [id number] Date of Birth/Sex: 1945/12/05 (70 y.o. Female) Treating RN: [name], RN, [name], [name] Primary Care Physician: Glendon Axe Other Clinician: Referring Physician: Glendon Axe Treating Physician/Extender: Benjaman Pott in Treatment: 38 Vascular Assessment Pulses: Posterior Tibial Dorsalis Pedis Palpable: [Left:Yes] Extremity colors, hair growth, and conditions: Extremity Color: [Left:Mottled] Hair Growth on Extremity: [Left:No] Temperature of Extremity: [Left:Warm] Capillary Refill: [Left:< 3 seconds] Toe Nail Assessment Left: Right: Thick: No Discolored: No Deformed: No Improper Length and Hygiene: No Electronic Signature(s) Signed: 10/30/2015 5:42:33 PM By: [name] Previous Signature: 10/24/2015 12:59:18 PM Version By: Regan Lemming [name], RN Entered By: [name] on 10/30/2015 17:42:32 Augusta, Blake D.  ([id number]) -------------------------------------------------------------------------------- Multi Wound Chart Details Patient Name: [name] Date of Service: 10/24/2015 8:45 AM Medical Record Number: [id number] Patient Account Number: [id number] Date of Birth/Sex: September 04, 1946 (70 y.o. 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 Comorbid History: Cataracts, Asthma, Cataracts, Asthma, Cataracts, Asthma, Coronary Artery Disease, Coronary Artery Disease, Coronary Artery Disease, Hypertension, Type II Hypertension, Type II Hypertension, Type II Diabetes, Osteoarthritis, Diabetes, Osteoarthritis, Diabetes, Osteoarthritis, Neuropathy Neuropathy Neuropathy Date Acquired: 12/30/2014 04/29/2015 10/10/2015 Weeks of Treatment: 38 22 2 Wound Status: Open Open Open Clustered Wound: Yes No No Measurements L x W x D 3.4x4x0.1 5.5x9x0.4 3x3.5x0.1 (cm) Area (cm) : 10.681 38.877 8.247 Volume (cm) : 1.068 15.551 0.825 Sheppard, Jarelyn D. ([id number]) % Reduction in Area: -1410.70% -20579.30% -138.60% % Reduction in Volume: -1404.20% -81747.40% -138.40% Classification: Full Thickness Without Full Thickness Without Unable to visualize wound Exposed Support Exposed Support bed Structures Structures HBO Classification: Grade 2 Grade 2 N/A Exudate Amount: Large Large Medium Exudate Type: Serous Serosanguineous  Serosanguineous Exudate Color: amber red, brown red, brown Wound Margin: Thickened Thickened Distinct, outline attached Granulation Amount: Medium (34-66%) Large (67-100%) None Present (0%) Granulation Quality: Red, Pink Red, Pink, Hyper- N/A granulation, Friable Necrotic Amount: Medium (34-66%) Small (1-33%) Large (67-100%) Necrotic Tissue: [place] Exposed Structures: Fascia: No N/A Fascia: No Fat: No Fat: No Tendon: No Tendon: No Muscle: No Muscle: No Joint: No Joint: No Bone: No Bone: No Limited to Skin Limited to Skin Breakdown Breakdown Epithelialization: None None None Periwound Skin Texture: Edema: Yes Edema: Yes Edema: No Scarring: Yes Scarring: Yes Excoriation: No Induration: No Callus: No Crepitus: No Fluctuance: No Friable: No Rash: No Scarring: No Periwound Skin Maceration: Yes Moist: Yes Moist: Yes Moisture: Moist: Yes Maceration: No Dry/Scaly: No Periwound Skin Color: No Abnormalities Noted No Abnormalities Noted Atrophie Blanche: No Cyanosis: No Ecchymosis: No Erythema: No Hemosiderin Staining: No Mottled: No Pallor: No Rubor: No Temperature: No Abnormality No Abnormality No Abnormality Tenderness on Yes Yes Yes Palpation: Wound Preparation: Ulcer Cleansing: Ulcer Cleansing: Ulcer Cleansing: Rinsed/Irrigated with Rinsed/Irrigated with Rinsed/Irrigated with Saline Saline Saline Nappier, Annye D. ([id number]) Topical Anesthetic Topical Anesthetic Topical Anesthetic Applied: Other: lidocaine Applied: Other: lidocaine Applied: Other: lidocaine 4% 4% 4% Treatment Notes Wound #2 (Left, Distal Lower Leg) 1. Cleansed with: Cleanse wound with antibacterial soap and water 3. Peri-wound Care: Moisturizing lotion 4. Dressing Applied: Other dressing (specify in notes) 5. Secondary Dressing Applied Dry Gauze 7. Secured with 2 Layer Lite Compression System - Left Lower  Extremity Notes drawtex kerlix coban Wound #5 (Right, Posterior Lower Leg) 1. Cleansed with: Clean wound with Normal Saline 3. Peri-wound Care: Skin Prep 4. Dressing Applied: Santyl Ointment 8. Negative Pressure Wound Therapy Wound Vac to wound continuously at 164mm/hg pressure Black Foam Notes 1 black foam applied Wound #6 (Right, Medial Lower Leg) 1. Cleansed with: Clean wound with Normal Saline 3. Peri-wound Care: Skin Prep 4. Dressing Applied: Santyl Ointment 8. Negative Pressure Wound Therapy Wound Vac to wound continuously at 190mm/hg pressure Black Foam Notes PRAGATHI, CUE. ([id number]) 1 black foam applied Electronic Signature(s) Signed: 10/30/2015 5:43:09 PM By: [name] Previous Signature: 10/24/2015 12:59:18 PM Version By: Regan Lemming [name], RN Entered By: [name] on 10/30/2015 17:43:09 Rubalcava, Arvetta D. ([id number]) -------------------------------------------------------------------------------- Multi-Disciplinary Care Plan Details Patient Name: JOELY, LAHENS D. Date of Service: 10/24/2015 8:45 AM Medical Record Number: [id number] Patient Account Number: [id number] Date of Birth/Sex: 1946/05/10 (70 y.o. Female) Treating RN: [name], RN,  [name], [name] Primary Care Physician: Glendon Axe Other Clinician: Referring Physician: Glendon Axe Treating Physician/Extender: Benjaman Pott in Treatment: 42 Active Inactive Orientation to the Wound Care Program Nursing Diagnoses: Knowledge deficit related to the wound healing center program Goals: Patient/caregiver will verbalize understanding of the [place] Program Date Initiated: 01/31/2015 Goal Status: Active Interventions: Provide education on orientation to the wound center Notes: Venous Leg Ulcer Nursing Diagnoses: Potential for venous Insuffiency (use before diagnosis confirmed) Goals: Non-invasive venous studies are completed as ordered Date Initiated: 01/31/2015 Goal Status:  Active Patient/caregiver will verbalize understanding of disease process and disease management Date Initiated: 01/31/2015 Goal Status: Active Interventions: Assess peripheral edema status every visit. Notes: Wound/Skin Impairment Nursing Diagnoses: Impaired tissue integrity Knowledge deficit related to smoking impact on wound healing Vigen, Caylyn D. ([id number]) Goals: Patient/caregiver will verbalize understanding of skin care regimen Date Initiated: 01/31/2015 Goal Status: Active Ulcer/skin breakdown will heal within 14 weeks Date Initiated: 01/31/2015 Goal Status: Active Interventions: Assess ulceration(s) every visit Notes: Electronic Signature(s) Signed: 10/30/2015 5:42:51 PM By: [name] Previous Signature: 10/24/2015 12:59:18 PM Version By: Regan Lemming [name], RN Entered By: [name] on 10/30/2015 17:42:51 Dershem, Diya D. ([id number]) -------------------------------------------------------------------------------- Pain Assessment Details Patient Name: [name] Date of Service: 10/24/2015 8:45 AM Medical Record Number: [id number] Patient Account Number: [id number] Date of Birth/Sex: 1946-02-24 (70 y.o. Female) Treating RN: Baruch Gouty, RN, [name], [name] Primary Care Physician: Glendon Axe Other Clinician: Referring Physician: Glendon Axe Treating Physician/Extender: Frann Rider in Treatment: 38 Active Problems Location of Pain Severity and Description of Pain Patient Has Paino Yes Site Locations Rate the pain. Current Pain Level: 5 Pain Management and Medication Current Pain Management: Electronic Signature(s) Signed: 10/24/2015 12:59:18 PM By: Regan Lemming [name], RN Entered By: Regan Lemming on 10/24/2015 08:31:39 Haughey, Ellena D. ([id number]) -------------------------------------------------------------------------------- Patient/Caregiver Education Details Patient Name: [name] Date of Service: 10/24/2015 8:45 AM Medical Record Number:  [id number] Patient Account Number: [id number] Date of Birth/Gender: 17-Jan-1946 (70 y.o. Female) Treating RN: Baruch Gouty, RN, [name], [name] Primary Care Physician: Glendon Axe Other Clinician: Referring Physician: Glendon Axe Treating Physician/Extender: Benjaman Pott in Treatment: 56 Education Assessment Education Provided To: Patient Education Topics Provided Welcome To [facility]: Methods: Explain/Verbal Electronic Signature(s) Signed: 10/30/2015 5:44:43 PM By: [name] Previous Signature: 10/24/2015 12:59:18 PM Version By: Regan Lemming [name], RN Previous Signature: 10/24/2015 8:58:30 AM Version By: Judene Companion MD Entered By: [name] on 10/30/2015 17:44:43 Jentz, Gweneth D. ([id number]) -------------------------------------------------------------------------------- Wound Assessment Details Patient Name: ABBYE, YUNKER D. Date of Service: 10/24/2015 8:45 AM Medical Record Number: [id number] Patient Account Number: [id number] Date of Birth/Sex: October 25, 1946 (70 y.o. Female) Treating RN: [name], RN, [name], [place] Primary Care Physician: Glendon Axe Other Clinician: Referring Physician: Glendon Axe Treating Physician/Extender: Frann Rider in Treatment: 58 Wound Status Wound Number: 2 Primary Venous Leg Ulcer Etiology: Wound Location: Left Lower Leg - Distal Wound Open Wounding Event: Blister Status: Date Acquired: 12/30/2014 Comorbid Cataracts, Asthma, Coronary Artery Weeks Of Treatment: 38 History: Disease, Hypertension, Type II Clustered Wound: Yes Diabetes, Osteoarthritis, Neuropathy Photos Photo Uploaded By: Regan Lemming on 10/24/2015 12:57:14 Wound Measurements Length: (cm) 3.4 Width: (cm) 4 Depth: (cm) 0.1 Area: (cm) 10.681 Volume: (cm) 1.068 % Reduction in Area: -1410.7% % Reduction in Volume: -1404.2% Epithelialization: None Tunneling: No Undermining: No Wound Description Full Thickness Without Classification: Exposed  Support Structures Diabetic Severity Grade 2 (Wagner): NUR, GOSSMAN ([id number]) Foul Odor After Cleansing: No Wound Margin: Thickened Exudate Amount: Large Exudate Type:  Serous Exudate Color: amber Wound Bed Granulation Amount: Medium (34-66%) Exposed Structure Granulation Quality: Red, Pink Fascia Exposed: No Necrotic Amount: Medium (34-66%) Fat Layer Exposed: No Necrotic Quality: Adherent Slough Tendon Exposed: No Muscle Exposed: No Joint Exposed: No Bone Exposed: No Limited to Skin Breakdown Periwound Skin Texture Texture Color No Abnormalities Noted: No No Abnormalities Noted: No Localized Edema: Yes Temperature / Pain Scarring: Yes Temperature: No Abnormality Moisture Tenderness on Palpation: Yes No Abnormalities Noted: No Maceration: Yes Moist: Yes Wound Preparation Ulcer Cleansing: Rinsed/Irrigated with Saline Topical Anesthetic Applied: Other: lidocaine 4%, Electronic Signature(s) Signed: 10/24/2015 12:59:18 PM By: Regan Lemming [name], RN Entered By: Regan Lemming on 10/24/2015 08:51:15 Stefano, Careena D. ([id number]) -------------------------------------------------------------------------------- Wound Assessment Details Patient Name: TEMPRANCE, BENTANCOURT D. Date of Service: 10/24/2015 8:45 AM Medical Record Number: [id number] Patient Account Number: [id number] Date of Birth/Sex: 1946/05/07 (70 y.o. Female) Treating RN: [name], RN, [name], [place] Primary Care Physician: Glendon Axe Other Clinician: Referring Physician: Glendon Axe Treating Physician/Extender: Frann Rider in Treatment: 58 Wound Status Wound Number: 5 Primary Venous Leg Ulcer Etiology: Wound Location: Right Lower Leg - Posterior Wound Open Wounding Event: Gradually Appeared Status: Date Acquired: 04/29/2015 Comorbid Cataracts, Asthma, Coronary Artery Weeks Of Treatment: 22 History: Disease, Hypertension, Type II Clustered Wound: No Diabetes, Osteoarthritis, Neuropathy Photos Photo  Uploaded By: Regan Lemming on 10/24/2015 12:57:15 Wound Measurements Length: (cm) 5.5 Width: (cm) 9 Depth: (cm) 0.4 Area: (cm) 38.877 Volume: (cm) 15.551 % Reduction in Area: -20579.3% % Reduction in Volume: -81747.4% Epithelialization: None Tunneling: No Undermining: No Wound Description Full Thickness Without Exposed Foul Odor A Classification: Support Structures Diabetic Severity Grade 2 (Wagner): Wound Margin: Thickened Exudate Amount: Large Exudate Type: Serosanguineous Exudate Color: red, brown fter Cleansing: No Wound Bed Large (67-100%) Coltrin, Yulissa D. ([id number]) Granulation Amount: Granulation Red, Pink, Hyper-granulation, Quality: Friable Necrotic Amount: Small (1-33%) Necrotic Quality: Adherent Slough Periwound Skin Texture Texture Color No Abnormalities Noted: No No Abnormalities Noted: No Localized Edema: Yes Temperature / Pain Scarring: Yes Temperature: No Abnormality Moisture Tenderness on Palpation: Yes No Abnormalities Noted: No Moist: Yes Wound Preparation Ulcer Cleansing: Rinsed/Irrigated with Saline Topical Anesthetic Applied: Other: lidocaine 4%, Electronic Signature(s) Signed: 10/24/2015 12:59:18 PM By: Regan Lemming [name], RN Entered By: Regan Lemming on 10/24/2015 08:52:12 Muscarella, Esmirna D. ([id number]) -------------------------------------------------------------------------------- Wound Assessment Details Patient Name: MYSTICA, CEDERQUIST D. Date of Service: 10/24/2015 8:45 AM Medical Record Number: [id number] Patient Account Number: [id number] Date of Birth/Sex: Dec 12, 1945 (70 y.o. Female) Treating RN: [name], RN, [name], [place] Primary Care Physician: Glendon Axe Other Clinician: Referring Physician: Glendon Axe Treating Physician/Extender: Frann Rider in Treatment: 50 Wound Status Wound Number: 6 Primary Diabetic Wound/Ulcer of the Lower Etiology: Extremity Wound Location: Right Lower Leg - Medial Wound Open Wounding Event:  Gradually Appeared Status: Date Acquired: 10/10/2015 Comorbid Cataracts, Asthma, Coronary Artery Weeks Of Treatment: 2 History: Disease, Hypertension, Type II Clustered Wound: No Diabetes, Osteoarthritis, Neuropathy Photos Photo Uploaded By: Regan Lemming on 10/24/2015 12:57:16 Wound Measurements Length: (cm) 3 Width: (cm) 3.5 Depth: (cm) 0.1 Area: (cm) 8.247 Volume: (cm) 0.825 % Reduction in Area: -138.6% % Reduction in Volume: -138.4% Epithelialization: None Tunneling: No Undermining: No Wound Description Classification: Unable to visualize wound bed Wound Margin: Distinct, outline attached Exudate Amount: Medium Exudate Type: Serosanguineous Caloca, Evalie D. ([id number]) Foul Odor After Cleansing: No Exudate Color: red, brown Wound Bed Granulation Amount: None Present (0%) Exposed Structure Necrotic Amount: Large (67-100%) Fascia Exposed: No Necrotic Quality: Eschar, Adherent Slough Fat Layer Exposed: No Tendon Exposed: No Muscle  Exposed: No Joint Exposed: No Bone Exposed: No Limited to Skin Breakdown Periwound Skin Texture Texture Color No Abnormalities Noted: No No Abnormalities Noted: No Callus: No Atrophie Blanche: No Crepitus: No Cyanosis: No Excoriation: No Ecchymosis: No Fluctuance: No Erythema: No Friable: No Hemosiderin Staining: No Induration: No Mottled: No Localized Edema: No Pallor: No Rash: No Rubor: No Scarring: No Temperature / Pain Moisture Temperature: No Abnormality No Abnormalities Noted: No Tenderness on Palpation: Yes Dry / Scaly: No Maceration: No Moist: Yes Wound Preparation Ulcer Cleansing: Rinsed/Irrigated with Saline Topical Anesthetic Applied: Other: lidocaine 4%, Electronic Signature(s) Signed: 10/24/2015 12:59:18 PM By: Regan Lemming [name], RN Entered By: Regan Lemming on 10/24/2015 08:52:42 Petsch, Kimila D. ([id number]) -------------------------------------------------------------------------------- Vitals  Details Patient Name: [name] Date of Service: 10/24/2015 8:45 AM Medical Record Number: [id number] Patient Account Number: [id number] Date of Birth/Sex: 23-Apr-1946 (70 y.o. Female) Treating RN: [name], RN, [name], [place] Primary Care Physician: Glendon Axe Other Clinician: Referring Physician: Glendon Axe Treating Physician/Extender: Benjaman Pott in Treatment: 38 Vital Signs Time Taken: 08:34 Temperature (F): 97.3 Height (in): 65 Pulse (bpm): 88 Weight (lbs): 248 Respiratory Rate (breaths/min): 20 Body Mass Index (BMI): 41.3 Blood Pressure (mmHg): 178/79 Reference Range: 80 - 120 mg / dl Electronic Signature(s) Signed: 10/30/2015 5:42:22 PM By: [name] Previous Signature: 10/24/2015 12:59:18 PM Version By: Regan Lemming [name], RN Entered By: [name] on 10/30/2015 17:42:22

## 2015-10-29 ENCOUNTER — Encounter: Payer: Commercial Managed Care - HMO | Attending: Surgery

## 2015-10-29 DIAGNOSIS — I83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: Secondary | ICD-10-CM | POA: Diagnosis not present

## 2015-10-29 DIAGNOSIS — E11622 Type 2 diabetes mellitus with other skin ulcer: Secondary | ICD-10-CM | POA: Insufficient documentation

## 2015-10-29 DIAGNOSIS — E7800 Pure hypercholesterolemia, unspecified: Secondary | ICD-10-CM | POA: Insufficient documentation

## 2015-10-29 DIAGNOSIS — L97322 Non-pressure chronic ulcer of left ankle with fat layer exposed: Secondary | ICD-10-CM | POA: Insufficient documentation

## 2015-10-29 DIAGNOSIS — I2510 Atherosclerotic heart disease of native coronary artery without angina pectoris: Secondary | ICD-10-CM | POA: Diagnosis not present

## 2015-10-29 DIAGNOSIS — L03116 Cellulitis of left lower limb: Secondary | ICD-10-CM | POA: Diagnosis not present

## 2015-10-29 DIAGNOSIS — I83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: Secondary | ICD-10-CM | POA: Insufficient documentation

## 2015-10-29 DIAGNOSIS — N189 Chronic kidney disease, unspecified: Secondary | ICD-10-CM | POA: Diagnosis not present

## 2015-10-29 DIAGNOSIS — I890 Lymphedema, not elsewhere classified: Secondary | ICD-10-CM | POA: Insufficient documentation

## 2015-10-29 DIAGNOSIS — I129 Hypertensive chronic kidney disease with stage 1 through stage 4 chronic kidney disease, or unspecified chronic kidney disease: Secondary | ICD-10-CM | POA: Diagnosis not present

## 2015-10-29 NOTE — Progress Notes (Signed)
ELAISHA, BEESON ([id number]) Visit Report for 10/29/2015 Arrival Information Details Patient Name: [name], [name] Date of Service: 10/29/2015 11:30 AM Medical Record Patient Account Number: [id number] Number: Treating RN: [name] 05-07-1946 (70 y.o. Other Clinician: Date of Birth/Sex: Female) Treating BURNS III, Primary Care Physician/Extender: Lauralyn Primes, [place] Physician: Referring Physician: Glendon Axe Weeks in Treatment: 30 Visit Information History Since Last Visit Added or deleted any medications: No Patient Arrived: Ambulatory Any new allergies or adverse reactions: No Arrival Time: 11:35 Had a fall or experienced change in No Accompanied By: granddaughter activities of daily living that may affect Transfer Assistance: None risk of falls: Patient Identification Verified: Yes Signs or symptoms of abuse/neglect since last No Secondary Verification Process Yes visito Completed: Hospitalized since last visit: No Patient Requires Transmission- No Pain Present Now: No Based Precautions: Patient Has Alerts: Yes Patient Alerts: Patient on Blood Thinner Electronic Signature(s) Signed: 10/29/2015 1:50:31 PM By: [name] Entered By: [name] on 10/29/2015 13:50:31 Laatsch, Kitty D. ([id number]) -------------------------------------------------------------------------------- Encounter Discharge Information Details Patient Name: [name], [name] Date of Service: 10/29/2015 11:30 AM Medical Record Patient Account Number: [id number] Number: Treating RN: [name] January 25, 1946 (70 y.o. Other Clinician: Date of Birth/Sex: Female) Treating BURNS III, Primary Care Physician/Extender: Lauralyn Primes, [place] Physician: Referring Physician: Glendon Axe Weeks in Treatment: 86 Encounter Discharge Information Items Discharge Pain Level: 0 Discharge Condition: Stable Ambulatory Status: Ambulatory Discharge Destination:  Home Transportation: Private Auto Accompanied By: granddaughter Schedule Follow-up Appointment: Yes Medication Reconciliation completed No and provided to Patient/Care Salmaan Patchin: Clinical Summary of Care: Electronic Signature(s) Signed: 10/29/2015 1:52:49 PM By: [name] Entered By: [name] on 10/29/2015 13:52:48 Leiker, Alveta D. ([id number]) -------------------------------------------------------------------------------- Patient/Caregiver Education Details Patient Name: Kristin Stare D. Date of Service: 10/29/2015 11:30 AM Medical Record Patient Account Number: [id number] Number: Treating RN: [name] 02-04-1946 (70 y.o. Other Clinician: Date of Birth/Gender: Female) Treating BURNS III, Primary Care Physician/Extender: Lauralyn Primes, [place] Physician: Weeks in Treatment: 38 Referring Physician: Glendon Axe Education Assessment Education Provided To: Patient and Caregiver Education Topics Provided Wound/Skin Impairment: Handouts: Other: how to change canister and order supplies if needed Methods: Demonstration, Explain/Verbal Responses: State content correctly Electronic Signature(s) Signed: 10/29/2015 1:52:25 PM By: [name] Entered By: [name] on 10/29/2015 13:52:25

## 2015-10-31 ENCOUNTER — Encounter: Payer: Commercial Managed Care - HMO | Admitting: Surgery

## 2015-10-31 DIAGNOSIS — E11622 Type 2 diabetes mellitus with other skin ulcer: Secondary | ICD-10-CM | POA: Diagnosis not present

## 2015-11-01 NOTE — Progress Notes (Signed)
SORREL, HARTSTEIN ([id number]) Visit Report for 10/31/2015 Arrival Information Details Patient Name: [name] Date of Service: 10/31/2015 2:00 PM Medical Record Number: [id number] Patient Account Number: [id number] Date of Birth/Sex: Nov 27, 1945 (70 y.o. Female) Treating RN: [name] Primary Care Physician: Glendon Axe Other Clinician: Referring Physician: Glendon Axe Treating Physician/Extender: Frann Rider in Treatment: 39 Visit Information History Since Last Visit Added or deleted any medications: No Patient Arrived: Cane Any new allergies or adverse reactions: No Arrival Time: 14:14 Had a fall or experienced change in No Accompanied By: granddaughter activities of daily living that may affect Transfer Assistance: None risk of falls: Patient Identification Verified: Yes Signs or symptoms of abuse/neglect since last No Secondary Verification Process Yes visito Completed: Hospitalized since last visit: No Patient Requires Transmission- No Pain Present Now: No Based Precautions: Patient Has Alerts: Yes Patient Alerts: Patient on Blood Thinner Electronic Signature(s) Signed: 10/31/2015 5:42:17 PM By: [name] Entered By: [name] on 10/31/2015 14:14:49 Epps, Lashannon D. ([id number]) -------------------------------------------------------------------------------- Encounter Discharge Information Details Patient Name: [name], [name] Date of Service: 10/31/2015 2:00 PM Medical Record Number: [id number] Patient Account Number: [id number] Date of Birth/Sex: [birth year] (70 y.o. Female) Treating RN: Baruch Gouty, RN, [name], [name] Primary Care Physician: Glendon Axe Other Clinician: Referring Physician: Glendon Axe Treating Physician/Extender: Frann Rider in Treatment: 65 Encounter Discharge Information Items Discharge Pain Level: 0 Discharge Condition: Stable Ambulatory Status: Cane Discharge Destination: Home Transportation: Private  Auto Accompanied By: granddaughter Schedule Follow-up Appointment: Yes Medication Reconciliation completed and provided to Patient/Care No Sebrina Kessner: Provided on Clinical Summary of Care: 10/31/2015 Form Type Recipient Paper Patient LM Electronic Signature(s) Signed: 10/31/2015 4:38:44 PM By: [name] Previous Signature: 10/31/2015 3:41:43 PM Version By: Ruthine Dose Entered By: [name] on 10/31/2015 16:38:44 Snethen, Darien D. ([id number]) -------------------------------------------------------------------------------- Lower Extremity Assessment Details Patient Name: [name], [name] Date of Service: 10/31/2015 2:00 PM Medical Record Number: [id number] Patient Account Number: [id number] Date of Birth/Sex: 11/05/1945 (70 y.o. Female) Treating RN: [name] Primary Care Physician: Glendon Axe Other Clinician: Referring Physician: Glendon Axe Treating Physician/Extender: Frann Rider in Treatment: 39 Edema Assessment Assessed: [Left: No] [Right: No] Edema: [Left: Yes] [Right: Yes] Vascular Assessment Pulses: Posterior Tibial Dorsalis Pedis Palpable: [Left:Yes] [Right:Yes] Extremity colors, hair growth, and conditions: Extremity Color: [Left:Hyperpigmented] [Right:Hyperpigmented] Hair Growth on Extremity: [Left:No] [Right:No] Temperature of Extremity: [Left:Warm] [Right:Warm] Capillary Refill: [Left:< 3 seconds] [Right:< 3 seconds] Electronic Signature(s) Signed: 10/31/2015 5:42:17 PM By: [name] Entered By: [name] on 10/31/2015 14:32:56 [name], [name] ([id number]) -------------------------------------------------------------------------------- Multi Wound Chart Details Patient Name: Kristin Stare D. Date of Service: 10/31/2015 2:00 PM Medical Record Number: [id number] Patient Account Number: [id number] Date of Birth/Sex: 1946/06/01 (70 y.o. Female) Treating RN: [name] Primary Care Physician: Glendon Axe Other  Clinician: Referring Physician: Glendon Axe Treating Physician/Extender: Frann Rider in Treatment: 39 Vital Signs Height(in): 65 Pulse(bpm): 78 Weight(lbs): 248 Blood Pressure 146/60 (mmHg): Body Mass Index(BMI): 41 Temperature(F): 97.8 Respiratory Rate 20 (breaths/min): Photos: [2:No Photos] [5:No Photos] [6:No Photos] Wound Location: [2:Left Lower Leg - Distal] [5:Right Lower Leg - Posterior] [6:Right Lower Leg - Medial] Wounding Event: [2:Blister] [5:Gradually Appeared] [6:Gradually Appeared] Primary Etiology: [2:Venous Leg Ulcer] [5:Venous Leg Ulcer] [6:Diabetic Wound/Ulcer of the Lower Extremity] Comorbid History: [2:Cataracts, Asthma, Coronary Artery Disease, Coronary Artery Disease, Coronary Artery Disease, Hypertension, Type II Diabetes, Osteoarthritis, Diabetes, Osteoarthritis, Diabetes, Osteoarthritis, Neuropathy] [5:Cataracts, Asthma,  Hypertension, Type II Neuropathy] [6:Cataracts, Asthma, Hypertension, Type II Neuropathy] Date Acquired: [2:12/30/2014] [5:04/29/2015] [6:10/10/2015] Weeks of Treatment: [2:39] [5:23] [  6:3] Wound Status: [2:Open] [5:Open] [6:Open] Clustered Wound: [2:Yes] [5:No] [6:No] Measurements L x W x D 2.4x3.3x0.1 [5:5.3x7.9x0.4] [6:2.8x3.3x0.1] (cm) Area (cm) : [2:6.22] [5:32.885] [6:7.257] Volume (cm) : [2:0.622] [5:13.154] [6:0.726] % Reduction in Area: [2:-779.80%] [5:-17392.00%] [6:-110.00%] % Reduction in Volume: -776.10% [5:-69131.60%] [6:-109.80%] Classification: [2:Full Thickness Without Exposed Support Structures] [5:Full Thickness Without Exposed Support Structures] [6:Unable to visualize wound bed] HBO Classification: [2:Grade 2] [5:Grade 2] [6:N/A] Exudate Amount: [2:Large] [5:Large] [6:Medium] Exudate Type: [2:Serous] [5:Serosanguineous] [6:Serosanguineous] Exudate Color: [2:amber] [5:red, brown] [6:red, brown] Wound Margin: [2:Thickened] [5:Thickened] [6:Distinct, outline attached] Granulation Amount: [2:Medium (34-66%)]  [5:Large (67-100%)] [6:None Present (0%)] Granulation Quality: Red, Pink Red, Pink, Hyper- N/A granulation, Friable Necrotic Amount: Medium (34-66%) Small (1-33%) Large (67-100%) Necrotic Tissue: Adherent [place], Adherent Slough Exposed Structures: Fascia: No N/A Fascia: No Fat: No Fat: No Tendon: No Tendon: No Muscle: No Muscle: No Joint: No Joint: No Bone: No Bone: No Limited to Skin Limited to Skin Breakdown Breakdown Epithelialization: None None None Periwound Skin Texture: Edema: Yes Edema: Yes Edema: No Scarring: Yes Scarring: Yes Excoriation: No Induration: No Callus: No Crepitus: No Fluctuance: No Friable: No Rash: No Scarring: No Periwound Skin Maceration: Yes Moist: Yes Moist: Yes Moisture: Moist: Yes Maceration: No Dry/Scaly: No Periwound Skin Color: No Abnormalities Noted No Abnormalities Noted Atrophie Blanche: No Cyanosis: No Ecchymosis: No Erythema: No Hemosiderin Staining: No Mottled: No Pallor: No Rubor: No Temperature: No Abnormality No Abnormality No Abnormality Tenderness on Yes Yes Yes Palpation: Wound Preparation: Ulcer Cleansing: Ulcer Cleansing: Ulcer Cleansing: Rinsed/Irrigated with Rinsed/Irrigated with Rinsed/Irrigated with Saline Saline Saline Topical Anesthetic Topical Anesthetic Topical Anesthetic Applied: Other: lidocaine Applied: Other: lidocaine Applied: Other: lidocaine 4% 4% 4% Treatment Notes Electronic Signature(s) Signed: 10/31/2015 5:42:17 PM By: [name] Entered By: [name] on 10/31/2015 14:47:12 [name], [name] ([id number]) [name], [name] ([id number]) -------------------------------------------------------------------------------- [place] Details Patient Name: AFRODITI, LITTON D. Date of Service: 10/31/2015 2:00 PM Medical Record Number: [id number] Patient Account Number: [id number] Date of Birth/Sex: 05-23-1946 (70 y.o. Female) Treating RN: [name] Primary Care Physician: Glendon Axe Other Clinician: Referring Physician: Glendon Axe Treating Physician/Extender: Frann Rider in Treatment: 91 Active Inactive Orientation to the Wound Care Program Nursing Diagnoses: Knowledge deficit related to the wound healing center program Goals: Patient/caregiver will verbalize understanding of the [facility] Program Date Initiated: 01/31/2015 Goal Status: Active Interventions: Provide education on orientation to the wound center Notes: Venous Leg Ulcer Nursing Diagnoses: Potential for venous Insuffiency (use before diagnosis confirmed) Goals: Non-invasive venous studies are completed as ordered Date Initiated: 01/31/2015 Goal Status: Active Patient/caregiver will verbalize understanding of disease process and disease management Date Initiated: 01/31/2015 Goal Status: Active Interventions: Assess peripheral edema status every visit. Notes: Wound/Skin Impairment Nursing Diagnoses: Impaired tissue integrity Knowledge deficit related to smoking impact on wound healing Britten, [name] ([id number]) Goals: Patient/caregiver will verbalize understanding of skin care regimen Date Initiated: 01/31/2015 Goal Status: Active Ulcer/skin breakdown will heal within 14 weeks Date Initiated: 01/31/2015 Goal Status: Active Interventions: Assess ulceration(s) every visit Notes: Electronic Signature(s) Signed: 10/31/2015 5:42:17 PM By: [name] Entered By: [name] on 10/31/2015 14:33:09 [name], [name] ([id number]) -------------------------------------------------------------------------------- Patient/Caregiver Education Details Patient Name: Kristin Stare D. Date of Service: 10/31/2015 2:00 PM Medical Record Number: [id number] Patient Account Number: [id number] Date of Birth/Gender: 1946/08/19 (70 y.o. Female) Treating RN: [name] Primary Care Physician: Glendon Axe Other Clinician: Referring  Physician: Glendon Axe Treating Physician/Extender: Frann Rider in Treatment: 39 Education Assessment Education  Provided To: Patient and Caregiver Education Topics Provided Wound/Skin Impairment: Handouts: Other: apligraf and wound care Methods: Explain/Verbal Responses: State content correctly Electronic Signature(s) Signed: 10/31/2015 4:39:32 PM By: [name] Entered By: [name] on 10/31/2015 16:39:32 Richner, Mariateresa D. ([id number]) -------------------------------------------------------------------------------- Wound Assessment Details Patient Name: [name], [name] Date of Service: 10/31/2015 2:00 PM Medical Record Number: [id number] Patient Account Number: [id number] Date of Birth/Sex: 1946/01/14 (70 y.o. Female) Treating RN: [name] Primary Care Physician: Glendon Axe Other Clinician: Referring Physician: Glendon Axe Treating Physician/Extender: Frann Rider in Treatment: 39 Wound Status Wound Number: 2 Primary Venous Leg Ulcer Etiology: Wound Location: Left Lower Leg - Distal Wound Open Wounding Event: Blister Status: Date Acquired: 12/30/2014 Comorbid Cataracts, Asthma, Coronary Artery Weeks Of Treatment: 39 History: Disease, Hypertension, Type II Clustered Wound: Yes Diabetes, Osteoarthritis, Neuropathy Photos Photo Uploaded By: [name] on 10/31/2015 16:52:04 Wound Measurements Length: (cm) 2.4 Width: (cm) 3.3 Depth: (cm) 0.1 Area: (cm) 6.22 Volume: (cm) 0.622 % Reduction in Area: -779.8% % Reduction in Volume: -776.1% Epithelialization: None Tunneling: No Undermining: No Wound Description Full Thickness Without Classification: Exposed Support Structures Diabetic Severity Grade 2 (Wagner): Wound Margin: Thickened Exudate Amount: Large Exudate Type: Serous Exudate Color: amber Foul Odor After Cleansing: No Wound Bed Granulation Amount: Medium (34-66%) Exposed Structure [place], [name]  ([id number]) Granulation Quality: Red, Pink Fascia Exposed: No Necrotic Amount: Medium (34-66%) Fat Layer Exposed: No Necrotic Quality: Adherent Slough Tendon Exposed: No Muscle Exposed: No Joint Exposed: No Bone Exposed: No Limited to Skin Breakdown Periwound Skin Texture Texture Color No Abnormalities Noted: No No Abnormalities Noted: No Localized Edema: Yes Temperature / Pain Scarring: Yes Temperature: No Abnormality Moisture Tenderness on Palpation: Yes No Abnormalities Noted: No Maceration: Yes Moist: Yes Wound Preparation Ulcer Cleansing: Rinsed/Irrigated with Saline Topical Anesthetic Applied: Other: lidocaine 4%, Treatment Notes Wound #2 (Left, Distal Lower Leg) 1. Cleansed with: Cleanse wound with antibacterial soap and water 2. Anesthetic Topical Lidocaine 4% cream to wound bed prior to debridement 3. Peri-wound Care: Skin Prep 4. Dressing Applied: Mepitel 5. Secondary Dressing Applied ABD and Kerlix/Conform Notes apligraf applied by dr Con Memos today Loann Quill Electronic Signature(s) Signed: 10/31/2015 5:42:17 PM By: [name] Entered By: [name] on 10/31/2015 14:30:46 [name], [name] ([id number]) -------------------------------------------------------------------------------- Wound Assessment Details Patient Name: [name], [name] Date of Service: 10/31/2015 2:00 PM Medical Record Number: [id number] Patient Account Number: [id number] Date of Birth/Sex: 17-Nov-1945 (70 y.o. Female) Treating RN: [name] Primary Care Physician: Glendon Axe Other Clinician: Referring Physician: Glendon Axe Treating Physician/Extender: Frann Rider in Treatment: 35 Wound Status Wound Number: 5 Primary Venous Leg Ulcer Etiology: Wound Location: Right Lower Leg - Posterior Wound Open Wounding Event: Gradually Appeared Status: Date Acquired: 04/29/2015 Comorbid Cataracts, Asthma, Coronary Artery Weeks Of Treatment: 23 History: Disease,  Hypertension, Type II Clustered Wound: No Diabetes, Osteoarthritis, Neuropathy Photos Photo Uploaded By: [name] on 10/31/2015 16:52:40 Wound Measurements Length: (cm) 5.3 Width: (cm) 7.9 Depth: (cm) 0.4 Area: (cm) 32.885 Volume: (cm) 13.154 % Reduction in Area: -17392% % Reduction in Volume: -69131.6% Epithelialization: None Tunneling: No Undermining: No Wound Description Full Thickness Without Exposed Foul Odor A Classification: Support Structures Diabetic Severity Grade 2 (Wagner): Wound Margin: Thickened Exudate Amount: Large Exudate Type: Serosanguineous Exudate Color: red, brown fter Cleansing: No Wound Bed Large (67-100%) Serrao, [name] ([id number]) Granulation Amount: Granulation Red, Pink, Hyper-granulation, Quality: Friable Necrotic Amount: Small (1-33%) Necrotic Quality: Adherent Slough Periwound Skin Texture Texture Color No Abnormalities Noted: No No Abnormalities Noted: No  Localized Edema: Yes Temperature / Pain Scarring: Yes Temperature: No Abnormality Moisture Tenderness on Palpation: Yes No Abnormalities Noted: No Moist: Yes Wound Preparation Ulcer Cleansing: Rinsed/Irrigated with Saline Topical Anesthetic Applied: Other: lidocaine 4%, Treatment Notes Wound #5 (Right, Posterior Lower Leg) 1. Cleansed with: Cleanse wound with antibacterial soap and water 3. Peri-wound Care: Skin Prep 4. Dressing Applied: Mepitel 8. Negative Pressure Wound Therapy Wound Vac to wound continuously at 174mm/hg pressure Black Foam Notes apligraf applied by Dr Con Memos today, 1 black foam applied Electronic Signature(s) Signed: 10/31/2015 5:42:17 PM By: [name] Entered By: [name] on 10/31/2015 14:31:41 [place], [name] ([id number]) -------------------------------------------------------------------------------- Wound Assessment Details Patient Name: Kristin Stare D. Date of Service: 10/31/2015 2:00 PM Medical Record Number:  [id number] Patient Account Number: [id number] Date of Birth/Sex: 1946/04/24 (70 y.o. Female) Treating RN: [name] Primary Care Physician: Glendon Axe Other Clinician: Referring Physician: Glendon Axe Treating Physician/Extender: Frann Rider in Treatment: 39 Wound Status Wound Number: 6 Primary Diabetic Wound/Ulcer of the Lower Etiology: Extremity Wound Location: Right Lower Leg - Medial Wound Open Wounding Event: Gradually Appeared Status: Date Acquired: 10/10/2015 Comorbid Cataracts, Asthma, Coronary Artery Weeks Of Treatment: 3 History: Disease, Hypertension, Type II Clustered Wound: No Diabetes, Osteoarthritis, Neuropathy Photos Photo Uploaded By: [name] on 10/31/2015 16:52:41 Wound Measurements Length: (cm) 2.8 % Reduction in Width: (cm) 3.3 % Reduction in Depth: (cm) 0.1 Epithelializati Area: (cm) 7.257 Tunneling: Volume: (cm) 0.726 Undermining: Area: -110% Volume: -109.8% on: None No No Wound Description Classification: Unable to visualize wound bed Wound Margin: Distinct, outline attached Exudate Amount: Medium Exudate Type: Serosanguineous Exudate Color: red, brown Foul Odor After Cleansing: No Wound Bed Granulation Amount: None Present (0%) Exposed Structure Necrotic Amount: Large (67-100%) Fascia Exposed: No Necrotic Quality: Eschar, Adherent Slough Fat Layer Exposed: No Tendon Exposed: No [place], [name] ([id number]) Muscle Exposed: No Joint Exposed: No Bone Exposed: No Limited to Skin Breakdown Periwound Skin Texture Texture Color No Abnormalities Noted: No No Abnormalities Noted: No Callus: No Atrophie Blanche: No Crepitus: No Cyanosis: No Excoriation: No Ecchymosis: No Fluctuance: No Erythema: No Friable: No Hemosiderin Staining: No Induration: No Mottled: No Localized Edema: No Pallor: No Rash: No Rubor: No Scarring: No Temperature / Pain Moisture Temperature: No Abnormality No Abnormalities  Noted: No Tenderness on Palpation: Yes Dry / Scaly: No Maceration: No Moist: Yes Wound Preparation Ulcer Cleansing: Rinsed/Irrigated with Saline Topical Anesthetic Applied: Other: lidocaine 4%, Treatment Notes Wound #6 (Right, Medial Lower Leg) 1. Cleansed with: Cleanse wound with antibacterial soap and water 3. Peri-wound Care: Skin Prep 4. Dressing Applied: Dry Gauze Santyl Ointment Other dressing (specify in notes) Notes xtrasorb and NPWT drape Electronic Signature(s) Signed: 10/31/2015 5:42:17 PM By: [name] Entered By: [name] on 10/31/2015 14:32:29 Kester, Amily D. ([id number]) -------------------------------------------------------------------------------- Vitals Details Patient Name: DONETTE, SLAPE D. Date of Service: 10/31/2015 2:00 PM Medical Record Number: [id number] Patient Account Number: [id number] Date of Birth/Sex: 1945-10-29 (70 y.o. Female) Treating RN: [name] Primary Care Physician: Glendon Axe Other Clinician: Referring Physician: Glendon Axe Treating Physician/Extender: Frann Rider in Treatment: 39 Vital Signs Time Taken: 14:15 Temperature (F): 97.8 Height (in): 65 Pulse (bpm): 78 Weight (lbs): 248 Respiratory Rate (breaths/min): 20 Body Mass Index (BMI): 41.3 Blood Pressure (mmHg): 146/60 Reference Range: 80 - 120 mg / dl Electronic Signature(s) Signed: 10/31/2015 5:42:17 PM By: [name] Entered By: [name] on 10/31/2015 14:19:29

## 2015-11-01 NOTE — Progress Notes (Addendum)
BOWEN, BONTON ([id number]) Visit Report for 10/31/2015 Chief Complaint Document Details Patient Name: [name], [name] Date of Service: 10/31/2015 2:00 PM Medical Record Patient Account Number: [id number] Number: [name], RN, [name], Treating RN: 24-Sep-1946 (70 y.o. [name] Date of Birth/Sex: Female) Other Clinician: Primary Care Physician: [facility], Delana Meyer Treating [name] Referring Physician: Glendon Axe Physician/Extender: Weeks in Treatment: 42 Information Obtained from: Patient Chief Complaint Patient presents to the wound care center for a consult due non healing wound 70 year old patient comes with a history of having a ulcer on the left lower extremity for the past 4 weeks. she says she's had swelling of both lower extremities for about a year after she started having prednisone. 02/07/2015 -- her vascular appointments obtained were in the first and third week of June. she is able to go to [place] and we will try and get her some earlier appointments. Other than that nothing else has changed in her management. Electronic Signature(s) Signed: 10/31/2015 3:20:37 PM By: [name], [name] Previous Signature: 10/31/2015 2:26:41 PM Version By: [name], [name] Entered By: [name] on 10/31/2015 15:20:37 Esbenshade, Oneita DMarland Kitchen ([id number]) -------------------------------------------------------------------------------- Cellular or Tissue Based Product Details Patient Name: [name], [name] Date of Service: 10/31/2015 2:00 PM Medical Record Patient Account Number: [id number] Number: [name], RN, [name], Treating RN: 1946/07/14 (70 y.o. [name] Date of Birth/Sex: Female) Other Clinician: Primary Care Physician: [facility], Delana Meyer Treating [name] Referring Physician: Glendon Axe Physician/Extender: Weeks in Treatment: 39 Cellular or Tissue Based Wound #2 Left,Distal Lower Leg Product Type Applied to: Performed By: Physician [name], MD Cellular or  Tissue Based Apligraf Product Type: Time-Out Taken: Yes Location: genitalia / hands / feet / multiple digits Wound Size (sq cm): 7.92 Product Size (sq cm): 44 Waste Size (sq cm): 36 Waste Reason: wound size Amount of Product Applied (sq cm): 8 Lot #: [id number] Expiration Date: 11/07/2015 Fenestrated: Yes Instrument: Blade Reconstituted: Yes Solution Type: saline Solution Amount: 10ml Lot #: [id number] Solution Expiration 02/22/2017 Date: Secured: Yes Secured With: Steri-Strips Dressing Applied: Yes Primary Dressing: mepitel Procedural Pain: 0 Post Procedural Pain: 0 Response to Treatment: Procedure was tolerated well Post Procedure Diagnosis Same as Pre-procedure Electronic Signature(s) Signed: 10/31/2015 3:18:42 PM By: [name], [name] SHAWNIA, WUNDERLIN D. ([id number]) Entered By: [name] on 10/31/2015 15:18:42 Teale, Mande D. ([id number]) -------------------------------------------------------------------------------- Cellular or Tissue Based Product Details Patient Name: [name], [name] Date of Service: 10/31/2015 2:00 PM Medical Record Patient Account Number: [id number] Number: [name], RN, [name], Treating RN: 1946/04/14 (70 y.o. [name] Date of Birth/Sex: Female) Other Clinician: Primary Care Physician: [facility], Delana Meyer Treating [name] Referring Physician: Glendon Axe Physician/Extender: Weeks in Treatment: 39 Cellular or Tissue Based Wound #5 Right,Posterior Lower Leg Product Type Applied to: Performed By: Physician [name], MD Cellular or Tissue Based Apligraf Product Type: Time-Out Taken: Yes Location: genitalia / hands / feet / multiple digits Wound Size (sq cm): 41.87 Product Size (sq cm): 36 Waste Size (sq cm): 0 Amount of Product Applied (sq cm): 36 Lot #: [id number] Expiration Date: 11/07/2015 Fenestrated: Yes Instrument: Blade Reconstituted: Yes Solution Type: saline Solution Amount: 72ml Lot #: [id number] Solution  Expiration 02/22/2017 Date: Secured: Yes Secured With: Steri-Strips Dressing Applied: Yes Primary Dressing: mepitel Procedural Pain: 0 Post Procedural Pain: 0 Response to Treatment: Procedure was tolerated well Post Procedure Diagnosis Same as Pre-procedure Electronic Signature(s) Signed: 10/31/2015 3:18:51 PM By: [name], [name] Entered By: [name] on 10/31/2015 15:18:51 Seckel, Oriyah D. ([id number]) Kreiger, Endiya  D. ([id number]) -------------------------------------------------------------------------------- HPI Details Patient Name: [name], [name] Date of Service: 10/31/2015 2:00 PM Medical Record Patient Account Number: [id number] Number: [name], RN, [name], Treating RN: 27-Nov-1945 (70 y.o. [name] Date of Birth/Sex: Female) Other Clinician: Primary Care Physician: [facility], Delana Meyer Treating Illyanna Petillo Referring Physician: Glendon Axe Physician/Extender: Weeks in Treatment: 37 History of Present Illness HPI Description: 70 year old patient who is known to have diabetes mellitus type 2, chronic renal insufficiency, coronary artery disease, hypertension, hypercholesterolemia, temporal arteritis and inflammatory arthritiss also has a history of having a hysterectomy and some orthopedic related surgeries. The ulcer on the left lower extremity started off as a blister and then. Got progressively worse. She does not have any fever or chills and has not had any recent surgical intervention for this. Her last hemoglobin A1c was 10.1 in September 2015. She has been recently put on doxycycline by her PCP. She is now also allergic to doxycycline and was this was changed over to Keflex. 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 She does see a cardiologist and also takes a diuretic. 02/07/2015 her arterial and venous duplex studies to be done have dates been given as  the first and third week of June. This is at [facility]. We are going to try and get early appointments at [facility]. other than that nothing has changed in her management. 02/14/2015 -- we have been able to get her an appointment in [facility] on May 20 which is much earlier than her previous ones at [place]. She continues with her prednisone and her sugars are in the range of 150-200. 02/21/2015 We were able to get a vascular lab workup for her today and she is going to be there at 2:00 this afternoon. the swelling of her leg has gone down significantly but she still has some tenderness over the wounds. 02/28/2015 - She has had one of two vascular workups done, and this coming Tuesday has another, at [facility]. She continues to be on steroid medications. She has significant sensitivity in her left lower extremity and has pain suggestive of neuropathic pain and I have asked her to address this with her primary care physician. 03/07/2015 -- The patient saw Dr. Lucky Cowboy for a consultation and he has had her arteries are okay but she has 2 incompetent veins on the left lower extremity and he is going to set her up for surgery. Official report is awaited. Addendum: Official reports are now available and on 03/04/2015. She was seen and lower extremity venous duplex exam was done. There was reflux present within the left greater saphenous vein below the knee and also the left small saphenous vein. Arterial duplex showed normal triphasic waveforms throughout the left lower extremity without any significant stenosis. Her ABIs were noncompressible bilaterally but a waveforms were normal and a digital pressures were normal bilaterally consistent with no significant arterial insufficiency. He has recommended endovenous ablation of both the left small saphenous and the left great saphenous vein. This would still be scheduled later. 03/14/2015 -- she has heard back from the  vascular office and has surgery scheduled for sometime in July. ANNAELLE, VALLONE ([id number]) Her rheumatologist has decreased her prednisone dosage but she still on it. She has also had cataract surgery in her right eye recently this week. 03/20/2015 - No new complaints today. Pain improved. No fever or chills. Tolerating  2 layer compression. 04/14/2015 -- she was doing very well today she went off on vacation and now her edema has increased markedly the ulceration is bigger and her diabetes is not under control. 04/21/2015 -- I spoke to her PCP Dr. Candiss Norse and discussed the management which would include being seen by a general surgeon for debridement and taking multiple punch biopsies which would help in establishing the diagnosis of this is a vasculitis. She is agreeable about this and will set her up for the procedure with Dr. Tamala Julian at [facility]. She was seen by the surgeon Dr. Jamal Collin. 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 She is scheduled to have laser ablation done next week.Pt was sent here for possible biopsy to look for vasculitis. Feel it would be better to wait after laser ablation is completed- the ulcer may heal fully and biopsy may not be necessary 04/29/2015 -- she had the venous ablation done by Dr. Lucky Cowboy last Friday and we do not have any notes yet. She is doing fine otherwise. 05/06/2015 --Review of her recent vascular intervention shows that she was seen by Dr. Lucky Cowboy on 04/29/2015. The follow-up duplex which was done showed that both the great saphenous vein and the small saphenous vein remained patent with reflux consistent with an unsuccessful ablation. He has rescheduled her for another the endovenous ablation to be done in about 4 weekso time. 05/13/2015 -- he was seen by her surgeon Dr. Jamal Collin who asked her to continue with conservative therapy and he  would speak to Dr. Lucky Cowboy about her management. Dr. Lucky Cowboy is going to schedule her surgery in the middle of August for a repeat endovenous ablation. Her pus culture from last week has grown : [place] her noted her sensitivity report but due to her multiple allergies I had tried clindamycin and she developed a rash with this too. She has been prescribed and anti-buttocks in the ER and is has it at home and she will let is know what she is going to be taking. 05/20/2015 -- she has developed a small spot on her right lower extremity but besides that it is not a full fledged ulceration. She did not get to see Dr. Lucky Cowboy last week and hopefully she will see him in the near future. 05/27/2015 -she is still awaiting her appointment with Dr.Dew and her vascular procedure is not scheduled until August 19. She will be seeing her PCP tomorrow and I have asked her to convey our discussion so that she is aware that debridement has not been done yet. 06/03/2015 -- was seen by her rheumatologist Dr. 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 He is lowering her prednisone to 12-1/2 mg for 1 month and then 10 mg per the next month. I will again make an attempt to speak to her PCP Dr. Candiss Norse and her surgeon Dr. 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 06/17/2015 -- Dr.Dew did her vascular procedure last week and a follow-up venous ultrasound shows good resolution of the veins as per the patient's history. He is to see her back in 2 weeks. MEAGIN, MCCLUNG ([id number]) 07/07/2015. -- the patient has had a  heavy growth of Proteus mirabilis and Enterococcus faecalis. These are sensitive to several drugs but the  problem is she has allergies to all of these and hence I would like her to see Dr. Ola Spurr for this. She is also due to see Dr. Lucky Cowboy tomorrow and I will discussed the management with him including debridement under anesthesia and possible biopsies. 07/14/2015 -- she has an appointment to see Dr. Ola Spurr tomorrow and did see Dr. Bunnie Domino PA who will discuss my request with him. 07/21/2015 -- saw Dr. Ola Spurr was able to do a test on her and has put her on amoxicillin. She has been tolerating that and has had no problems with allergies to this. 07/28/2015 -- Last Friday I spoke to Dr. Leotis Pain regarding her care and he said that her right leg did not need any surgery and on the left leg was doing pretty good. We did agree that if she undergoes any procedure in the future he would do a couple of punch biopsies of the wound. 08/18/2015 -- her right leg is very tender and there is significant amount of slough. The left leg is looking much better 09/02/2015 -- she is going to have a debridement and punch biopsies of her right lower extremity by Dr. Leotis Pain this coming Thursday. Also seen Dr. Ola Spurr who has continued her on ciprofloxacin. 09/09/2015 -- on 09/04/2015 Dr. Leotis Pain took her to surgery - Irrigation and excisional debridement of skin, soft tissue, and muscle to about 40 cm2 to the right posterior calf with biopsy. Pathology results are -- DIAGNOSIS: SKIN, RIGHT LOWER EXTREMITY; BIOPSY: SKIN AND SOFT TISSUE WITH ULCERATION, SEE NOTE. - NEGATIVE FOR DYSPLASIA AND MALIGNANCY. Note: There is acute inflammation and ulceration of the epidermis. The dermis shows nonspecific inflammation, neovascularization, and hemosiderin deposition. The differential diagnosis for these findings includes stasis dermatitis, nonspecific dermatitis, and infection. Correlation with clinical findings is required. A PAS fungal stain is obtained and results will be reported in an addendum. cultures were  also sent and this grew Pseudomonas aeruginosa, Escherichia coli, Proteus mirabilis, Klebsiella pneumoniae and they were all sensitive to ciprofloxacin which she is on. 09/16/2015 -- he saw Dr. 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 He is going to call the patient in and discuss with her the management possibly with Imuran. I will continue treating her locally. 09/23/2015 -- she saw Dr. Ola Spurr today who was going to continue the antibiotics for now and stop after this course. She has an appointment to see Dr. Jefm Bryant in about a week's time. Her wound VAC has arrived but she did not bring it with her today. We will try and set her up for changes to the right leg 3 times a week. She is here for her first application of Apligraf to the left lower extremity. 09/30/2015 -- she has seen Dr. 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 because she is ambulatory at home health will not apply a wound VAC and she will have to come here 3 times a week on Monday Wednesday and Friday. 10/10/2015 -- she is here for a second application of Apligraf to her left lower extremity. 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 Jefm Bryant. Other than that she is doing well 10/31/2015 --  she is here for a third application of Apligraf to her left lower extremity. after reviewing her wound on the right side I noted that it is granulating extremely well and we will use the remnants of the Apligraf on the right leg. MERRILY, KLAMMER ([id number]) Electronic Signature(s) Signed: 10/31/2015 3:29:46 PM By: [name], [name] Previous Signature: 10/31/2015 2:27:10 PM Version By: [name], [name] Entered By: [name] on 10/31/2015  15:29:46 Martian, Tocara DMarland Kitchen ([id number]) -------------------------------------------------------------------------------- Physical Exam Details Patient Name: Amundson, Golda D. Date of Service: 10/31/2015 2:00 PM Medical Record Patient Account Number: [id number] Number: [name], RN, [name], Treating RN: [birth year] (70 y.o. [name] Date of Birth/Sex: Female) Other Clinician: Primary Care Physician: [facility], Delana Meyer Treating [name] Referring Physician: Glendon Axe Physician/Extender: Weeks in Treatment: 39 Constitutional . Pulse regular. Respirations normal and unlabored. Afebrile. . Eyes Nonicteric. Reactive to light. Ears, Nose, Mouth, and Throat Lips, teeth, and gums WNL.Marland Kitchen Moist mucosa without lesions. Neck supple and nontender. No palpable supraclavicular or cervical adenopathy. Normal sized without goiter. Respiratory WNL. No retractions.. Cardiovascular Pedal Pulses WNL. No clubbing, cyanosis or edema. Lymphatic No adneopathy. No adenopathy. No adenopathy. Musculoskeletal Adexa without tenderness or enlargement.. Digits and nails w/o clubbing, cyanosis, infection, petechiae, ischemia, or inflammatory conditions.. Integumentary (Hair, Skin) No suspicious lesions. No crepitus or fluctuance. No peri-wound warmth or erythema. No masses.Marland Kitchen Psychiatric Judgement and insight Intact.. No evidence of depression, anxiety, or agitation.. Notes the left lower extremity wound looks clean. after freshening the wound edges Apligraf was applied with the usual precautions. The right lower extremity continues to have slough at the edges the rest of the wound looks excellent. I removed a bit of the slough at the edges and the wound was looking so good that I use the rest of the Apligraf on the right lower extremity. Electronic Signature(s) Signed: 10/31/2015 3:30:50 PM By: [name], [name] Previous Signature: 10/31/2015 2:29:15 PM Version By: [name], [name] Entered By:  [name] on 10/31/2015 15:30:50 Keatts, Tia Masker ([id number]) -------------------------------------------------------------------------------- Physician Orders Details Patient Name: STORMY, MEADOWS D. Date of Service: 10/31/2015 2:00 PM Medical Record Number: [id number] Patient Account Number: [id number] Date of Birth/Sex: 12-Nov-1945 (70 y.o. 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 Wound #5 Right,Posterior Lower Leg o Cleanse wound with mild soap and water o May shower with protection. Wound #6 Right,Medial Lower Leg o Cleanse wound with mild soap and water o May shower with protection. Skin Barriers/Peri-Wound Care Wound #2 Left,Distal Lower Leg o Skin Prep Wound #5 Right,Posterior Lower Leg o Skin Prep Wound #6 Right,Medial Lower Leg o Skin Prep Primary Wound Dressing Wound #2 Left,Distal Lower Leg Depuy, Damaris D. ([id number]) o Mepitel One Wound #5 Right,Posterior Lower Leg o Mepitel One Wound #6 Right,Medial Lower Leg o Dry  Gauze o Santyl Ointment o XtraSorb Dressing Change Frequency Wound #5 Right,Posterior  Lower Leg o Change Dressing Monday, Wednesday, Friday Wound #6 Right,Medial Lower Leg o Change dressing every week o Change Dressing Monday, Wednesday, Friday o Change Dressing Tuesday and Thursday. Follow-up Appointments Wound #2 Left,Distal Lower Leg o Return Appointment in 1 week. o Other: - nurse visit Monday and Wednesday Wound #5 Right,Posterior Lower Leg o Return Appointment in 1 week. o Other: - nurse visit Monday and Wednesday Wound #6 Right,Medial Lower Leg o Return Appointment in 1 week. o Other: - nurse visit Monday and Wednesday Edema Control Wound #2 Left,Distal Lower Leg o 2 Layer Compression System - Left Lower Extremity - kerlix and coban Negative Pressure Wound Therapy Wound #5 Right,Posterior Lower Leg o Wound VAC settings at 125/130 mmHg continuous pressure. Use BLACK/GREEN foam to wound cavity. Use WHITE foam to fill any tunnel/s and/or undermining. Change VAC dressing 2 X WEEK. Change canister as indicated when full. Nurse may titrate settings and frequency of dressing changes as clinically indicated. o Number of foam/gauze pieces used in the dressing = - 1 Advanced Therapies Wound #2 Left,Distal Lower Leg Sexson, Shemeka D. ([id number]) o Apligraf application in clinic; including contact layer, fixation with steri strips, dry gauze and cover dressing. Wound #5 Right,Posterior Lower Leg o Apligraf application in clinic; including contact layer, fixation with steri strips, dry gauze and cover dressing. Medications-please add to medication list. Wound #6 Right,Medial Lower Leg o Santyl Enzymatic Ointment Electronic Signature(s) Signed: 10/31/2015 4:38:17 PM By: [name] Signed: 10/31/2015 5:05:49 PM By: [name], [name] Previous Signature: 10/31/2015 4:34:43 PM Version By: [name] Previous Signature: 10/31/2015 4:34:16 PM Version By: [name] Entered By: [name] on 10/31/2015 16:38:17 Moultrie, Kadajah D.  ([id number]) -------------------------------------------------------------------------------- Problem List Details Patient Name: SALLY-ANNE, PELCHAT D. Date of Service: 10/31/2015 2:00 PM Medical Record Patient Account Number: [id number] Number: [name], RN, [name], Treating RN: 07-Jun-1946 (70 y.o. 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 ([id number]) Entered By: [name] on 10/31/2015 15:18:16 Remo, Aylana D. ([id number]) -------------------------------------------------------------------------------- Progress Note Details Patient Name: Burpee, Marilu D. Date of Service: 10/31/2015 2:00 PM Medical Record Patient Account Number: [id number] Number: [name], RN, [name], Treating RN: 19-Apr-1946 (70 y.o. [name] Date of Birth/Sex: Female) Other Clinician: Primary Care Physician: [facility], Delana Meyer Treating [name] Referring Physician: Glendon Axe Physician/Extender: Weeks in Treatment: 51 Subjective Chief  Complaint Information obtained from Patient Patient presents to the wound care center for a consult due non healing wound 70 year old patient comes with a history of having a ulcer on the left lower extremity for the past 4 weeks. she says she's had swelling of both lower extremities for about a year after  she started having prednisone. 02/07/2015 -- her vascular appointments obtained were in the first and third week of June. she is able to go to [place] and we will try and get her some earlier appointments. Other than that nothing else has changed in her management. History of Present Illness (HPI) 70 year old patient who is known to have diabetes mellitus type 2, chronic renal insufficiency, coronary artery disease, hypertension, hypercholesterolemia, temporal arteritis and inflammatory arthritiss also has a history of having a hysterectomy and some orthopedic related surgeries. The ulcer on the left lower extremity started off as a blister and then. Got progressively worse. She does not have any fever or chills and has not had any recent surgical intervention for this. Her last hemoglobin A1c was 10.1 in September 2015. She has been recently put on doxycycline by her PCP. She is now also allergic to doxycycline and was this was changed over to Keflex. 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 She does see a cardiologist and also takes a diuretic. 02/07/2015 her arterial and venous duplex studies to be done have dates been given as the first and third week of June. This is at [facility]. We are going to try and get early appointments at [facility]. other than that nothing has changed in her management. 02/14/2015 -- we have been able to get her an appointment in [facility] on May 20 which is much earlier than her previous ones at [place]. She continues with her prednisone and  her sugars are in the range of 150-200. 02/21/2015 We were able to get a vascular lab workup for her today and she is going to be there at 2:00 this afternoon. the swelling of her leg has gone down significantly but she still has some tenderness over the wounds. 02/28/2015 - She has had one of two vascular workups done, and this coming Tuesday has another, at [facility]. She continues to be on steroid medications. She has significant sensitivity in her left lower extremity and has pain suggestive of neuropathic pain and I have asked her to address this with her primary care physician. 03/07/2015 -- The patient saw Dr. Lucky Cowboy for a consultation and he has had her arteries are okay but she has 2 incompetent veins on the left lower extremity and he is going to set her up for surgery. Official report is JUDENE, GAHN ([id number]) awaited. Addendum: Official reports are now available and on 03/04/2015. She was seen and lower extremity venous duplex exam was done. There was reflux present within the left greater saphenous vein below the knee and also the left small saphenous vein. Arterial duplex showed normal triphasic waveforms throughout the left lower extremity without any significant stenosis. Her ABIs were noncompressible bilaterally but a waveforms were normal and a digital pressures were normal bilaterally consistent with no significant arterial insufficiency. He has recommended endovenous ablation of both the left small saphenous and the left great saphenous vein. This would still be scheduled later. 03/14/2015 -- she has heard back from the vascular office and has surgery scheduled for sometime in July. Her rheumatologist has decreased her prednisone dosage but she still on it. She has also had cataract surgery in her right eye recently this week. 03/20/2015 - No new complaints today. Pain improved. No fever or chills. Tolerating 2 layer compression. 04/14/2015 -- she  was doing very well today  she went off on vacation and now her edema has increased markedly the ulceration is bigger and her diabetes is not under control. 04/21/2015 -- I spoke to her PCP Dr. Candiss Norse and discussed the management which would include being seen by a general surgeon for debridement and taking multiple punch biopsies which would help in establishing the diagnosis of this is a vasculitis. She is agreeable about this and will set her up for the procedure with Dr. Tamala Julian at [facility]. She was seen by the surgeon Dr. Jamal Collin. 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 She is scheduled to have laser ablation done next week.Pt was sent here for possible biopsy to look for vasculitis. Feel it would be better to wait after laser ablation is completed- the ulcer may heal fully and biopsy may not be necessary 04/29/2015 -- she had the venous ablation done by Dr. Lucky Cowboy last Friday and we do not have any notes yet. She is doing fine otherwise. 05/06/2015 --Review of her recent vascular intervention shows that she was seen by Dr. Lucky Cowboy on 04/29/2015. The follow-up duplex which was done showed that both the great saphenous vein and the small saphenous vein remained patent with reflux consistent with an unsuccessful ablation. He has rescheduled her for another the endovenous ablation to be done in about 4 weeks time. 05/13/2015 -- he was seen by her surgeon Dr. Jamal Collin who asked her to continue with conservative therapy and he would speak to Dr. Lucky Cowboy about her management. Dr. Lucky Cowboy is going to schedule her surgery in the middle of August for a repeat endovenous ablation. Her pus culture from last week has grown : Saline her noted her sensitivity report but due to her multiple allergies I  had tried clindamycin and she developed a rash with this too. She has been prescribed and anti-buttocks in the ER and is has it at home and she will let is know what she is going to be taking. 05/20/2015 -- she has developed a small spot on her right lower extremity but besides that it is not a full fledged ulceration. She did not get to see Dr. Lucky Cowboy last week and hopefully she will see him in the near future. 05/27/2015 -she is still awaiting her appointment with Dr.Dew and her vascular procedure is not scheduled until August 19. She will be seeing her PCP tomorrow and I have asked her to convey our discussion so that she is aware that debridement has not been done yet. LISA, FAUNTLEROY ([id number]) 06/03/2015 -- was seen by her rheumatologist Dr. 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 He is lowering her prednisone to 12-1/2 mg for 1 month and then 10 mg per the next month. I will again make an attempt to speak to her PCP Dr. Candiss Norse and her surgeon Dr. 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 06/17/2015 -- Dr.Dew did her vascular procedure last week and a follow-up venous ultrasound shows good resolution of the veins as per the patient's history. He is to see her back in 2 weeks. 07/07/2015. -- the patient has had a heavy growth of Proteus mirabilis and Enterococcus faecalis. These are sensitive  to several drugs but the problem is she has allergies to all of these and hence I would like her to see Dr. Ola Spurr for this. She is also due to see Dr. Lucky Cowboy tomorrow and I will discussed the management with him including debridement under anesthesia and possible biopsies. 07/14/2015 -- she has an appointment to see Dr. Ola Spurr tomorrow and did see Dr. Bunnie Domino PA who will discuss my  request with him. 07/21/2015 -- saw Dr. Ola Spurr was able to do a test on her and has put her on amoxicillin. She has been tolerating that and has had no problems with allergies to this. 07/28/2015 -- Last Friday I spoke to Dr. Leotis Pain regarding her care and he said that her right leg did not need any surgery and on the left leg was doing pretty good. We did agree that if she undergoes any procedure in the future he would do a couple of punch biopsies of the wound. 08/18/2015 -- her right leg is very tender and there is significant amount of slough. The left leg is looking much better 09/02/2015 -- she is going to have a debridement and punch biopsies of her right lower extremity by Dr. Leotis Pain this coming Thursday. Also seen Dr. Ola Spurr who has continued her on ciprofloxacin. 09/09/2015 -- on 09/04/2015 Dr. Leotis Pain took her to surgery - Irrigation and excisional debridement of skin, soft tissue, and muscle to about 40 cm2 to the right posterior calf with biopsy. Pathology results are -- DIAGNOSIS: SKIN, RIGHT LOWER EXTREMITY; BIOPSY: SKIN AND SOFT TISSUE WITH ULCERATION, SEE NOTE. - NEGATIVE FOR DYSPLASIA AND MALIGNANCY. Note: There is acute inflammation and ulceration of the epidermis. The dermis shows nonspecific inflammation, neovascularization, and hemosiderin deposition. The differential diagnosis for these findings includes stasis dermatitis, nonspecific dermatitis, and infection. Correlation with clinical findings is required. A PAS fungal stain is obtained and results will be reported in an addendum. cultures were also sent and this grew Pseudomonas aeruginosa, Escherichia coli, Proteus mirabilis, Klebsiella pneumoniae and they were all sensitive to ciprofloxacin which she is on. 09/16/2015 -- he saw Dr. 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 He is  going to call the patient in and discuss with her the management possibly with Imuran. I will continue treating her locally. 09/23/2015 -- she saw Dr. Ola Spurr today who was going to continue the antibiotics for now and stop after this course. She has an appointment to see Dr. Jefm Bryant in about a week's time. Her wound VAC has arrived but she did not bring it with her today. We will try and set her up for changes to the right leg 3 times a week. She is here for her first application of Apligraf to the left lower extremity. JLA, FABRIS ([id number]) 09/30/2015 -- she has seen Dr. 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 because she is ambulatory at home health will not apply a wound VAC and she will have to come here 3 times a week on Monday Wednesday and Friday. 10/10/2015 -- she is here for a second application of Apligraf to her left lower extremity. 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 Jefm Bryant. Other than that she is doing well 10/31/2015 -- she is here for a third  application of Apligraf to her left lower extremity. after reviewing her wound on the right side I noted that it is granulating extremely well and we will use the remnants of the Apligraf on the right leg. Objective Constitutional Pulse regular. Respirations normal and unlabored. Afebrile. Vitals Time Taken: 2:15 PM, Height: 65 in, Weight: 248 lbs, BMI: 41.3, Temperature: 97.8 F, Pulse: 78 bpm, Respiratory Rate: 20 breaths/min, Blood Pressure: 146/60 mmHg. Eyes Nonicteric. Reactive to light. Ears, Nose, Mouth, and Throat Lips, teeth, and gums WNL.Marland Kitchen Moist mucosa without lesions. Neck supple and nontender. No palpable supraclavicular or cervical adenopathy. Normal sized without goiter. Respiratory WNL. No retractions.. Cardiovascular Pedal Pulses WNL. No  clubbing, cyanosis or edema. Lymphatic No adneopathy. No adenopathy. No adenopathy. Musculoskeletal Adexa without tenderness or enlargement.. Digits and nails w/o clubbing, cyanosis, infection, petechiae, ischemia, or inflammatory conditions.Marland Kitchen Psychiatric Judgement and insight Intact.. No evidence of depression, anxiety, or agitation.Marland Kitchen BLONNIE, CONSTANTINIDES ([id number]) General Notes: the left lower extremity wound looks clean. after freshening the wound edges Apligraf was applied with the usual precautions. The right lower extremity continues to have slough at the edges the rest of the wound looks excellent. I removed a bit of the slough at the edges and the wound was looking so good that I use the rest of the Apligraf on the right lower extremity. Integumentary (Hair, Skin) No suspicious lesions. No crepitus or fluctuance. No peri-wound warmth or erythema. No masses.. Wound #2 status is Open. Original cause of wound was Blister. The wound is located on the Left,Distal Lower Leg. The wound measures 2.4cm length x 3.3cm width x 0.1cm depth; 6.22cm^2 area and 0.622cm^3 volume. The wound is limited to skin breakdown. There is no tunneling or undermining noted. There is a large amount of serous drainage noted. The wound margin is thickened. There is medium (34- 66%) red, pink granulation within the wound bed. There is a medium (34-66%) amount of necrotic tissue within the wound bed including Adherent Slough. The periwound skin appearance exhibited: Localized Edema, Scarring, Maceration, Moist. Periwound temperature was noted as No Abnormality. The periwound has tenderness on palpation. Wound #5 status is Open. Original cause of wound was Gradually Appeared. The wound is located on the Right,Posterior Lower Leg. The wound measures 5.3cm length x 7.9cm width x 0.4cm depth; 32.885cm^2 area and 13.154cm^3 volume. There is no tunneling or undermining noted. There is a large amount of serosanguineous  drainage noted. The wound margin is thickened. There is large (67-100%) red, pink, friable granulation within the wound bed. There is a small (1-33%) amount of necrotic tissue within the wound bed including Adherent Slough. The periwound skin appearance exhibited: Localized Edema, Scarring, Moist. Periwound temperature was noted as No Abnormality. The periwound has tenderness on palpation. Wound #6 status is Open. Original cause of wound was Gradually Appeared. The wound is located on the Right,Medial Lower Leg. The wound measures 2.8cm length x 3.3cm width x 0.1cm depth; 7.257cm^2 area and 0.726cm^3 volume. The wound is limited to skin breakdown. There is no tunneling or undermining noted. There is a medium amount of serosanguineous drainage noted. The wound margin is distinct with the outline attached to the wound base. There is no granulation within the wound bed. There is a large (67- 100%) amount of necrotic tissue within the wound bed including Eschar and Adherent Slough. The periwound skin appearance exhibited: Moist. The periwound skin appearance did not exhibit: Callus, Crepitus, Excoriation, Fluctuance, Friable, Induration, Localized Edema, Rash, Scarring, Dry/Scaly, Maceration, Atrophie Blanche, Cyanosis, Ecchymosis,  Hemosiderin Staining, Mottled, Pallor, Rubor, Erythema. Periwound temperature was noted as No Abnormality. The periwound has tenderness on palpation. 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 The right lower extremity has a couple of areas which are new anterior medial to the old wound and this has some slough. We will use Santyl over this including application of the wound VAC as planned. the patient will be here next week for a wound check. Procedures Wound #2 Wound #2 is a Venous Leg Ulcer located on the Left,Distal Lower Leg. A skin graft procedure using a bioengineered skin substitute/cellular or tissue based product was performed by [name], MD. Apligraf was applied and secured with Steri-Strips. 8 sq cm of product was utilized and 36 sq cm was wasted due to wound size. Post Application, mepitel was applied. A Time Out was conducted prior to the start of the procedure. The procedure was tolerated well with a pain level of 0 throughout and a pain level of 0 following the procedure. Post procedure Diagnosis Wound #2: Same as Pre-Procedure . Wound #5 Wound #5 is a Venous Leg Ulcer located on the Right,Posterior Lower Leg. A skin graft procedure using a bioengineered skin substitute/cellular or tissue based product was performed by [name], MD. Apligraf was applied and secured with Steri-Strips. 36 sq cm of product was utilized and 0 sq cm was wasted. Post Application, mepitel was applied. A Time Out was conducted prior to the start of the procedure. The procedure was tolerated well with a pain level of 0 throughout and a pain level of 0 following the procedure. Post procedure Diagnosis Wound #5: Same as Pre-Procedure . Plan Wound Cleansing: Wound #2 Left,Distal Lower Leg: Stimpson, Deion D. ([id number]) Cleanse wound with mild soap and water May shower with protection. Wound #5 Right,Posterior Lower Leg: Cleanse wound with mild soap and water May shower with protection. Wound #6 Right,Medial Lower Leg: Cleanse wound with  mild soap and water May shower with protection. Skin Barriers/Peri-Wound Care: Wound #2 Left,Distal Lower Leg: Skin Prep Wound #5 Right,Posterior Lower Leg: Skin Prep Wound #6 Right,Medial Lower Leg: Skin Prep Primary Wound Dressing: Wound #2 Left,Distal Lower Leg: Mepitel One Wound #5 Right,Posterior Lower Leg: Mepitel One Wound #6 Right,Medial Lower Leg: Dry Gauze Santyl Ointment XtraSorb Dressing Change Frequency: Wound #5 Right,Posterior Lower Leg: Change Dressing Monday, Wednesday, Friday Wound #6 Right,Medial Lower Leg: Change dressing every week Change Dressing Monday, Wednesday, Friday Change Dressing Tuesday and Thursday. Follow-up Appointments: Wound #2 Left,Distal Lower Leg: Return Appointment in 1 week. Other: - nurse visit Monday and Wednesday Wound #5 Right,Posterior Lower Leg: Return Appointment in 1 week. Other: - nurse visit Monday and Wednesday Wound #6 Right,Medial Lower Leg: Return Appointment in 1 week. Other: - nurse visit Monday and Wednesday Edema Control: Wound #2 Left,Distal Lower Leg: 2 Layer Compression System - Left Lower Extremity - kerlix and coban Negative Pressure Wound Therapy: Wound #5  Right,Posterior Lower Leg: Wound VAC settings at 125/130 mmHg continuous pressure. Use BLACK/GREEN foam to wound cavity. Use WHITE foam to fill any tunnel/s and/or undermining. Change VAC dressing 2 X WEEK. Change canister as indicated when full. Nurse may titrate settings and frequency of dressing changes as clinically indicated. Number of foam/gauze pieces used in the dressing = - 1 Olmeda, Teyana D. ([id number]) Advanced Therapies: Wound #2 Left,Distal Lower Leg: Apligraf application in clinic; including contact layer, fixation with steri strips, dry gauze and cover dressing. Wound #5 Right,Posterior Lower Leg: Apligraf application in clinic; including contact layer, fixation with steri strips, dry gauze and cover dressing. Medications-please add to  medication list.: Wound #6 Right,Medial Lower Leg: Santyl Enzymatic Ointment The patient has had the third Apligraf applied to the left lower extremity and the rest of the Apligraf was applied to the right lower extremity. The right lower extremity has a couple of areas which are new anterior medial to the old wound and this has some slough. We will use Santyl over this including application of the wound VAC as planned. the patient will be here next week for a wound check. Electronic Signature(s) Signed: 10/31/2015 5:03:32 PM By: [name], [name] Previous Signature: 10/31/2015 3:32:49 PM Version By: [name], [name] Entered By: [name] on 10/31/2015 17:03:32 Kunkler, Omni D. ([id number]) -------------------------------------------------------------------------------- SuperBill Details Patient Name: KAISLEIGH, KUMLER D. Date of Service: 10/31/2015 Medical Record Patient Account Number: [id number] Number: [name], RN, [name], Treating RN: 1946-10-05 (70 y.o. 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 L03.116 Cellulitis of left lower limb Facility Procedures CPT4: Description Modifier Quantity Code [id number] (Facility Use Only) Apligraf 1 SQ CM 80 CPT4: [id number] - SKIN SUB GRAFT FACE/NK/HF/G 1 ICD-10 Description Diagnosis E11.622 Type 2 diabetes mellitus with other skin ulcer  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 L97.322 Non-pressure chronic  ulcer of left ankle with fat layer exposed CPT4: [id number] - SKIN SUB GRAFT F/N/HF/G ADDL 1 ICD-10 Description Diagnosis E11.622 Type 2 diabetes mellitus with other skin ulcer I83.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 I83.222 Varicose veins of  left lower extremity with both ulcer of calf and inflammation I89.0 Lymphedema, not elsewhere classified ZEFFIE, KAMPER ([id number]) Physician Procedures CPT4: Description Modifier Quantity Code [id number] - WC PHYS SKIN SUB GRAFT FACE/NK/HF/G 1 ICD-10 Description Diagnosis E11.622 Type 2 diabetes mellitus with other skin ulcer I83.222 Varicose veins of left lower extremity with both ulcer of calf and  inflammation L97.322 Non-pressure chronic ulcer of left ankle with fat layer exposed CPT4: [id number] - WC PHYS SKN SUB GRAFT F/N/HF/G ADDL 1 ICD-10 Description Diagnosis E11.622 Type 2 diabetes mellitus with other skin ulcer I83.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

## 2015-11-03 ENCOUNTER — Ambulatory Visit: Payer: PPO

## 2015-11-05 ENCOUNTER — Ambulatory Visit: Payer: PPO

## 2015-11-05 ENCOUNTER — Emergency Department: Payer: Commercial Managed Care - HMO

## 2015-11-05 ENCOUNTER — Inpatient Hospital Stay
Admission: EM | Admit: 2015-11-05 | Discharge: 2015-11-10 | DRG: 637 | Disposition: A | Discharge status: Home / Self Care | Payer: Commercial Managed Care - HMO | Attending: Internal Medicine | Admitting: Internal Medicine

## 2015-11-05 ENCOUNTER — Inpatient Hospital Stay
Admit: 2015-11-05 | Discharge: 2015-11-05 | Disposition: A | Discharge status: Home / Self Care | Payer: Commercial Managed Care - HMO | Attending: Internal Medicine | Admitting: Internal Medicine

## 2015-11-05 DIAGNOSIS — L97919 Non-pressure chronic ulcer of unspecified part of right lower leg with unspecified severity: Secondary | ICD-10-CM | POA: Diagnosis present

## 2015-11-05 DIAGNOSIS — J40 Bronchitis, not specified as acute or chronic: Secondary | ICD-10-CM | POA: Diagnosis present

## 2015-11-05 DIAGNOSIS — Z23 Encounter for immunization: Secondary | ICD-10-CM

## 2015-11-05 DIAGNOSIS — E1165 Type 2 diabetes mellitus with hyperglycemia: Secondary | ICD-10-CM | POA: Diagnosis present

## 2015-11-05 DIAGNOSIS — E1122 Type 2 diabetes mellitus with diabetic chronic kidney disease: Secondary | ICD-10-CM | POA: Diagnosis present

## 2015-11-05 DIAGNOSIS — N170 Acute kidney failure with tubular necrosis: Secondary | ICD-10-CM | POA: Diagnosis present

## 2015-11-05 DIAGNOSIS — Z888 Allergy status to other drugs, medicaments and biological substances status: Secondary | ICD-10-CM | POA: Diagnosis not present

## 2015-11-05 DIAGNOSIS — R600 Localized edema: Secondary | ICD-10-CM | POA: Diagnosis present

## 2015-11-05 DIAGNOSIS — E876 Hypokalemia: Secondary | ICD-10-CM | POA: Diagnosis present

## 2015-11-05 DIAGNOSIS — Z87891 Personal history of nicotine dependence: Secondary | ICD-10-CM | POA: Diagnosis not present

## 2015-11-05 DIAGNOSIS — Z882 Allergy status to sulfonamides status: Secondary | ICD-10-CM | POA: Diagnosis not present

## 2015-11-05 DIAGNOSIS — Z7982 Long term (current) use of aspirin: Secondary | ICD-10-CM | POA: Diagnosis not present

## 2015-11-05 DIAGNOSIS — I10 Essential (primary) hypertension: Secondary | ICD-10-CM | POA: Diagnosis present

## 2015-11-05 DIAGNOSIS — J9601 Acute respiratory failure with hypoxia: Secondary | ICD-10-CM | POA: Diagnosis present

## 2015-11-05 DIAGNOSIS — Z880 Allergy status to penicillin: Secondary | ICD-10-CM

## 2015-11-05 DIAGNOSIS — I248 Other forms of acute ischemic heart disease: Secondary | ICD-10-CM | POA: Diagnosis present

## 2015-11-05 DIAGNOSIS — I2510 Atherosclerotic heart disease of native coronary artery without angina pectoris: Secondary | ICD-10-CM | POA: Diagnosis present

## 2015-11-05 DIAGNOSIS — Z961 Presence of intraocular lens: Secondary | ICD-10-CM | POA: Diagnosis present

## 2015-11-05 DIAGNOSIS — I129 Hypertensive chronic kidney disease with stage 1 through stage 4 chronic kidney disease, or unspecified chronic kidney disease: Secondary | ICD-10-CM | POA: Diagnosis not present

## 2015-11-05 DIAGNOSIS — M1990 Unspecified osteoarthritis, unspecified site: Secondary | ICD-10-CM | POA: Diagnosis present

## 2015-11-05 DIAGNOSIS — Z833 Family history of diabetes mellitus: Secondary | ICD-10-CM

## 2015-11-05 DIAGNOSIS — J449 Chronic obstructive pulmonary disease, unspecified: Secondary | ICD-10-CM | POA: Diagnosis present

## 2015-11-05 DIAGNOSIS — Z794 Long term (current) use of insulin: Secondary | ICD-10-CM | POA: Diagnosis not present

## 2015-11-05 DIAGNOSIS — IMO0002 Reserved for concepts with insufficient information to code with codable children: Secondary | ICD-10-CM | POA: Diagnosis present

## 2015-11-05 DIAGNOSIS — N179 Acute kidney failure, unspecified: Secondary | ICD-10-CM

## 2015-11-05 DIAGNOSIS — E860 Dehydration: Secondary | ICD-10-CM | POA: Diagnosis present

## 2015-11-05 DIAGNOSIS — K5900 Constipation, unspecified: Secondary | ICD-10-CM | POA: Diagnosis present

## 2015-11-05 DIAGNOSIS — Z79899 Other long term (current) drug therapy: Secondary | ICD-10-CM | POA: Diagnosis not present

## 2015-11-05 DIAGNOSIS — Z9841 Cataract extraction status, right eye: Secondary | ICD-10-CM | POA: Diagnosis not present

## 2015-11-05 DIAGNOSIS — N183 Chronic kidney disease, stage 3 (moderate): Secondary | ICD-10-CM | POA: Diagnosis present

## 2015-11-05 DIAGNOSIS — Z90710 Acquired absence of both cervix and uterus: Secondary | ICD-10-CM

## 2015-11-05 DIAGNOSIS — E1121 Type 2 diabetes mellitus with diabetic nephropathy: Secondary | ICD-10-CM | POA: Diagnosis present

## 2015-11-05 DIAGNOSIS — E039 Hypothyroidism, unspecified: Secondary | ICD-10-CM | POA: Diagnosis present

## 2015-11-05 DIAGNOSIS — L97929 Non-pressure chronic ulcer of unspecified part of left lower leg with unspecified severity: Secondary | ICD-10-CM | POA: Diagnosis present

## 2015-11-05 LAB — GLUCOSE, CAPILLARY
GLUCOSE-CAPILLARY: 417 mg/dL — AB (ref 65–99)
GLUCOSE-CAPILLARY: 414 mg/dL — AB (ref 65–99)
GLUCOSE-CAPILLARY: 348 mg/dL — AB (ref 65–99)
GLUCOSE-CAPILLARY: 354 mg/dL — AB (ref 65–99)
Glucose-Capillary: 404 mg/dL — ABNORMAL HIGH (ref 65–99)
Glucose-Capillary: 424 mg/dL — ABNORMAL HIGH (ref 65–99)

## 2015-11-05 LAB — URINALYSIS COMPLETE WITH MICROSCOPIC (ARMC ONLY)
BILIRUBIN URINE: NEGATIVE
Ketones, ur: NEGATIVE mg/dL
Leukocytes, UA: NEGATIVE
Nitrite: NEGATIVE
Protein, ur: NEGATIVE mg/dL
SQUAMOUS EPITHELIAL / LPF: NONE SEEN
Specific Gravity, Urine: 1.006 (ref 1.005–1.030)
WBC, UA: NONE SEEN WBC/hpf (ref 0–5)
pH: 5 (ref 5.0–8.0)

## 2015-11-05 LAB — BRAIN NATRIURETIC PEPTIDE: B Natriuretic Peptide: 182 pg/mL — ABNORMAL HIGH (ref 0.0–100.0)

## 2015-11-05 LAB — CBC
HCT: 33.7 % — ABNORMAL LOW (ref 35.0–47.0)
Hemoglobin: 11 g/dL — ABNORMAL LOW (ref 12.0–16.0)
MCH: 28.3 pg (ref 26.0–34.0)
MCHC: 32.7 g/dL (ref 32.0–36.0)
MCV: 86.7 fL (ref 80.0–100.0)
PLATELETS: 199 10*3/uL (ref 150–440)
RBC: 3.89 MIL/uL (ref 3.80–5.20)
RDW: 13.4 % (ref 11.5–14.5)
WBC: 13.1 10*3/uL — ABNORMAL HIGH (ref 3.6–11.0)

## 2015-11-05 LAB — BASIC METABOLIC PANEL
Anion gap: 12 (ref 5–15)
BUN: 28 mg/dL — ABNORMAL HIGH (ref 6–20)
CALCIUM: 9.5 mg/dL (ref 8.9–10.3)
CO2: 25 mmol/L (ref 22–32)
CREATININE: 1.93 mg/dL — AB (ref 0.44–1.00)
Chloride: 97 mmol/L — ABNORMAL LOW (ref 101–111)
GFR calc non Af Amer: 25 mL/min — ABNORMAL LOW (ref >60)
GFR, EST AFRICAN AMERICAN: 29 mL/min — AB (ref >60)
GLUCOSE: 548 mg/dL — AB (ref 65–99)
Potassium: 4.1 mmol/L (ref 3.5–5.1)
Sodium: 134 mmol/L — ABNORMAL LOW (ref 135–145)

## 2015-11-05 LAB — HEMOGLOBIN A1C: HEMOGLOBIN A1C: 10.4 % — AB (ref 4.0–6.0)

## 2015-11-05 LAB — TROPONIN I
TROPONIN I: 0.03 ng/mL (ref <0.031)
Troponin I: 0.16 ng/mL — ABNORMAL HIGH (ref <0.031)
Troponin I: 0.19 ng/mL — ABNORMAL HIGH (ref <0.031)
Troponin I: 0.15 ng/mL — ABNORMAL HIGH (ref <0.031)

## 2015-11-05 MED ORDER — CALCIUM CARBONATE-VITAMIN D 500-200 MG-UNIT PO TABS
0.5000 | ORAL_TABLET | Freq: Every day | ORAL | Status: DC
  Administered 2015-11-05 – 2015-11-06 (×2): 0.5 via ORAL
  Administered 2015-11-07: 19:00:00 1 via ORAL
  Administered 2015-11-08 – 2015-11-10 (×3): 0.5 via ORAL
  Filled 2015-11-05 (×6): qty 1

## 2015-11-05 MED ORDER — SODIUM CHLORIDE 0.9 % IJ SOLN: 3.0000 mL | INTRAMUSCULAR | Status: DC | PRN

## 2015-11-05 MED ORDER — INSULIN GLARGINE 100 UNIT/ML ~~LOC~~ SOLN
10.0000 [IU] | Freq: Once | SUBCUTANEOUS | Status: AC
  Administered 2015-11-05: 10 [IU] via SUBCUTANEOUS
  Filled 2015-11-05: qty 0.1

## 2015-11-05 MED ORDER — PANTOPRAZOLE SODIUM 40 MG PO TBEC
40.0000 mg | DELAYED_RELEASE_TABLET | Freq: Every day | ORAL | Status: DC
  Administered 2015-11-05 – 2015-11-10 (×6): 40 mg via ORAL
  Filled 2015-11-05 (×6): qty 1

## 2015-11-05 MED ORDER — AMLODIPINE BESYLATE 5 MG PO TABS
5.0000 mg | ORAL_TABLET | Freq: Every day | ORAL | Status: DC
  Administered 2015-11-05 – 2015-11-10 (×6): 5 mg via ORAL
  Filled 2015-11-05 (×6): qty 1

## 2015-11-05 MED ORDER — COLLAGENASE 250 UNIT/GM EX OINT
1.0000 "application " | TOPICAL_OINTMENT | Freq: Every day | CUTANEOUS | Status: DC
  Administered 2015-11-05 – 2015-11-10 (×5): 1 via TOPICAL
  Filled 2015-11-05: qty 30

## 2015-11-05 MED ORDER — ASPIRIN 81 MG PO TABS: 81.0000 mg | ORAL_TABLET | Freq: Two times a day (BID) | ORAL | Status: DC

## 2015-11-05 MED ORDER — ASPIRIN 325 MG PO TABS
325.0000 mg | ORAL_TABLET | Freq: Every day | ORAL | Status: DC
  Administered 2015-11-05: 325 mg via ORAL
  Filled 2015-11-05: qty 1

## 2015-11-05 MED ORDER — HYDRALAZINE HCL 25 MG PO TABS
25.0000 mg | ORAL_TABLET | Freq: Three times a day (TID) | ORAL | Status: DC
  Administered 2015-11-05 – 2015-11-06 (×3): 25 mg via ORAL
  Administered 2015-11-07: 19:00:00 50 mg via ORAL
  Administered 2015-11-07 – 2015-11-10 (×8): 25 mg via ORAL
  Filled 2015-11-05 (×14): qty 1

## 2015-11-05 MED ORDER — INSULIN REGULAR HUMAN 100 UNIT/ML IJ SOLN: 12.0000 [IU] | Freq: Once | INTRAMUSCULAR | Status: DC

## 2015-11-05 MED ORDER — INSULIN ASPART 100 UNIT/ML ~~LOC~~ SOLN
12.0000 [IU] | Freq: Once | SUBCUTANEOUS | Status: AC
  Administered 2015-11-05: 12:00:00 12 [IU] via SUBCUTANEOUS
  Filled 2015-11-05: qty 12

## 2015-11-05 MED ORDER — POTASSIUM CHLORIDE CRYS ER 10 MEQ PO TBCR
10.0000 meq | EXTENDED_RELEASE_TABLET | Freq: Every day | ORAL | Status: DC
  Administered 2015-11-05 – 2015-11-10 (×6): 10 meq via ORAL
  Filled 2015-11-05 (×7): qty 1

## 2015-11-05 MED ORDER — FUROSEMIDE 8 MG/ML PO SOLN: 40.0000 mg | Freq: Once | ORAL | Status: DC

## 2015-11-05 MED ORDER — VITAMIN D 1000 UNITS PO TABS
1000.0000 [IU] | ORAL_TABLET | Freq: Every day | ORAL | Status: DC
  Administered 2015-11-05 – 2015-11-10 (×7): 1000 [IU] via ORAL
  Filled 2015-11-05 (×8): qty 1

## 2015-11-05 MED ORDER — OXYCODONE-ACETAMINOPHEN 5-325 MG PO TABS
ORAL_TABLET | ORAL | Status: AC
  Administered 2015-11-05: 1 via ORAL
  Filled 2015-11-05: qty 1

## 2015-11-05 MED ORDER — ENOXAPARIN SODIUM 40 MG/0.4ML ~~LOC~~ SOLN: 40.0000 mg | SUBCUTANEOUS | Status: DC

## 2015-11-05 MED ORDER — FUROSEMIDE 10 MG/ML IJ SOLN
INTRAMUSCULAR | Status: AC
  Filled 2015-11-05: qty 4

## 2015-11-05 MED ORDER — INSULIN ASPART 100 UNIT/ML ~~LOC~~ SOLN
0.0000 [IU] | Freq: Every day | SUBCUTANEOUS | Status: DC
  Administered 2015-11-05: 5 [IU] via SUBCUTANEOUS
  Administered 2015-11-06: 22:00:00 2 [IU] via SUBCUTANEOUS
  Administered 2015-11-09: 23:00:00 3 [IU] via SUBCUTANEOUS
  Filled 2015-11-05: qty 5
  Filled 2015-11-05: qty 2
  Filled 2015-11-05: qty 3

## 2015-11-05 MED ORDER — ATORVASTATIN CALCIUM 20 MG PO TABS
40.0000 mg | ORAL_TABLET | Freq: Every day | ORAL | Status: DC
  Administered 2015-11-05 – 2015-11-10 (×6): 40 mg via ORAL
  Filled 2015-11-05 (×6): qty 2

## 2015-11-05 MED ORDER — HYDRALAZINE HCL 20 MG/ML IJ SOLN
10.0000 mg | INTRAMUSCULAR | Status: DC | PRN
Start: 2015-11-05 — End: 2015-11-10
  Filled 2015-11-05: qty 1

## 2015-11-05 MED ORDER — ALBUTEROL SULFATE (2.5 MG/3ML) 0.083% IN NEBU
2.5000 mg | INHALATION_SOLUTION | Freq: Four times a day (QID) | RESPIRATORY_TRACT | Status: DC
  Administered 2015-11-05 – 2015-11-06 (×5): 2.5 mg via RESPIRATORY_TRACT
  Filled 2015-11-05 (×5): qty 3

## 2015-11-05 MED ORDER — SODIUM CHLORIDE 0.9 % IV BOLUS (SEPSIS): 1000.0000 mL | Freq: Once | INTRAVENOUS | Status: DC

## 2015-11-05 MED ORDER — NITROGLYCERIN 2 % TD OINT
0.5000 [in_us] | TOPICAL_OINTMENT | Freq: Four times a day (QID) | TRANSDERMAL | Status: DC
  Administered 2015-11-05 – 2015-11-07 (×5): 0.5 [in_us] via TOPICAL
  Filled 2015-11-05 (×6): qty 1

## 2015-11-05 MED ORDER — INSULIN GLARGINE 100 UNIT/ML ~~LOC~~ SOLN
40.0000 [IU] | Freq: Every day | SUBCUTANEOUS | Status: DC
  Administered 2015-11-05 – 2015-11-06 (×2): 40 [IU] via SUBCUTANEOUS
  Filled 2015-11-05 (×3): qty 0.4

## 2015-11-05 MED ORDER — ACETAMINOPHEN 500 MG PO TABS: 500.0000 mg | ORAL_TABLET | Freq: Four times a day (QID) | ORAL | Status: DC | PRN

## 2015-11-05 MED ORDER — INSULIN ASPART 100 UNIT/ML ~~LOC~~ SOLN
8.0000 [IU] | Freq: Once | SUBCUTANEOUS | Status: AC
  Administered 2015-11-05: 8 [IU] via INTRAVENOUS
  Filled 2015-11-05: qty 8

## 2015-11-05 MED ORDER — INSULIN ASPART 100 UNIT/ML ~~LOC~~ SOLN
0.0000 [IU] | Freq: Three times a day (TID) | SUBCUTANEOUS | Status: DC
  Administered 2015-11-05: 15 [IU] via SUBCUTANEOUS
  Administered 2015-11-06: 17:00:00 5 [IU] via SUBCUTANEOUS
  Administered 2015-11-06: 08:00:00 8 [IU] via SUBCUTANEOUS
  Administered 2015-11-06: 11:00:00 3 [IU] via SUBCUTANEOUS
  Administered 2015-11-07 (×2): 2 [IU] via SUBCUTANEOUS
  Administered 2015-11-08 – 2015-11-09 (×2): 3 [IU] via SUBCUTANEOUS
  Administered 2015-11-09: 12:00:00 2 [IU] via SUBCUTANEOUS
  Administered 2015-11-10: 5 [IU] via SUBCUTANEOUS
  Filled 2015-11-05: qty 5
  Filled 2015-11-05: qty 2
  Filled 2015-11-05: qty 5
  Filled 2015-11-05: qty 8
  Filled 2015-11-05: qty 3
  Filled 2015-11-05: qty 2
  Filled 2015-11-05: qty 15
  Filled 2015-11-05: qty 3
  Filled 2015-11-05: qty 2
  Filled 2015-11-05: qty 3

## 2015-11-05 MED ORDER — SODIUM CHLORIDE 0.9 % IJ SOLN
3.0000 mL | Freq: Two times a day (BID) | INTRAMUSCULAR | Status: DC
  Administered 2015-11-05 – 2015-11-10 (×10): 3 mL via INTRAVENOUS

## 2015-11-05 MED ORDER — INSULIN GLARGINE 100 UNIT/ML ~~LOC~~ SOLN
50.0000 [IU] | Freq: Every day | SUBCUTANEOUS | Status: DC
  Filled 2015-11-05: qty 0.5

## 2015-11-05 MED ORDER — AZATHIOPRINE 50 MG PO TABS
50.0000 mg | ORAL_TABLET | Freq: Every day | ORAL | Status: DC
  Administered 2015-11-05 – 2015-11-10 (×6): 50 mg via ORAL
  Filled 2015-11-05 (×6): qty 1

## 2015-11-05 MED ORDER — ASPIRIN EC 81 MG PO TBEC
81.0000 mg | DELAYED_RELEASE_TABLET | Freq: Every day | ORAL | Status: DC
  Administered 2015-11-06 – 2015-11-10 (×5): 81 mg via ORAL
  Filled 2015-11-05 (×5): qty 1

## 2015-11-05 MED ORDER — SODIUM CHLORIDE 0.9 % IV SOLN: 250.0000 mL | INTRAVENOUS | Status: DC | PRN

## 2015-11-05 MED ORDER — FUROSEMIDE 40 MG PO TABS
40.0000 mg | ORAL_TABLET | Freq: Two times a day (BID) | ORAL | Status: DC
  Administered 2015-11-05 – 2015-11-06 (×2): 40 mg via ORAL
  Filled 2015-11-05 (×4): qty 1

## 2015-11-05 MED ORDER — ONDANSETRON HCL 4 MG/2ML IJ SOLN
INTRAMUSCULAR | Status: AC
  Administered 2015-11-05: 4 mg via INTRAVENOUS
  Filled 2015-11-05: qty 2

## 2015-11-05 MED ORDER — ONDANSETRON HCL 4 MG/2ML IJ SOLN
4.0000 mg | Freq: Once | INTRAMUSCULAR | Status: AC
  Administered 2015-11-05: 4 mg via INTRAVENOUS

## 2015-11-05 MED ORDER — ONDANSETRON HCL 4 MG/2ML IJ SOLN
4.0000 mg | Freq: Four times a day (QID) | INTRAMUSCULAR | Status: DC
  Administered 2015-11-05 – 2015-11-06 (×4): 4 mg via INTRAVENOUS
  Filled 2015-11-05 (×5): qty 2

## 2015-11-05 MED ORDER — OXYCODONE-ACETAMINOPHEN 5-325 MG PO TABS
1.0000 | ORAL_TABLET | Freq: Four times a day (QID) | ORAL | Status: DC | PRN
  Administered 2015-11-05 – 2015-11-09 (×4): 1 via ORAL
  Filled 2015-11-05 (×4): qty 1

## 2015-11-05 NOTE — Consult Note (Signed)
WOC wound consult note Reason for Consult:Chronic full thickness ulcers to bilateral lower extremities, covered with Apligraf. Seen at wound care center. Wound type:Chronic venous Pressure Ulcer POA: N/A Right posterior lower leg with Apligraf and silicone contact layer in place.Premedicated for pain.   NPWT dressing removed, with the assistance of the [facility] RN.  WOC RN wanted Apligraf to remain intact.  Right anterior lower leg 3.5 cm x 3 cm 100% devitalized tissue to wound bed.  Santyl to wound bed and covered with silicone border foam.  Left leg has Apligraf in place.  Self adherent Coban dressing is in place.  This will be removed 11/07/15 at [facility].  Nonremovable dressing for today.  Wound bed: ruddy red and devitalized tissue.  Drainage (amount, consistency, odor) Minimal serosanguinous drainage.  Musty odor from Apligraf.  Periwound:Inact Dressing procedure/placement/frequency: Cleanse right leg ulcer with NS and pat gently dry.  Mepitel, silicone contact layer to remain in place to right posterior leg.  Apligraf is underneath.  Granufoam to wound bed.   Cleanse anterior leg ulcer with NS and pat dry.  Apply Santyl ointment, cover with NS moist 2x2 and silicone border foam dressing.  Cover with NPWT drape. [facility] team will follow.  Domenic Moras RN [name] [place] Pager [phone]

## 2015-11-05 NOTE — Progress Notes (Signed)
notified Dr. Marthann Schiller that fsbs was 424 prior to getting 12 u of novolog and scheduled lantus, per MD do not give additional coverage at this time. ? The santyl order, Per MD ask wound care RN if its needed. Telephone.

## 2015-11-05 NOTE — Progress Notes (Signed)
°   11/05/15 1500  Clinical Encounter Type  Visited With Patient and family together  Visit Type Initial;Spiritual support  Referral From Nurse  Consult/Referral To Chaplain  Spiritual Encounters  Spiritual Needs Other (Comment)  Stress Factors  Patient Stress Factors Health changes  Family Stress Factors Not reviewed  Advance Directives (For Healthcare)  Does patient have an advance directive? No  Would patient like information on creating an advanced directive? Yes - Scientist, clinical (histocompatibility and immunogenetics) given  Chaplain responded to page for AD papers.  AD documents were given to patient.  Patient will contact nurse who will contact chaplain when papers are completed. [place] [address] [phone]

## 2015-11-05 NOTE — Progress Notes (Signed)
Notifed Dr. Anselm Jungling that patient is having significant pain with wound vac change, per MD okay to order 5mg  percocet q6h prn mod/ severe pain. Notified MD that troponin was elevated to 0.16. MD acknowledged. Telephone order.

## 2015-11-05 NOTE — Plan of Care (Signed)
Alert and oriented, new admit from ED, lives at home, hypertensive in ED which has since resolved, nitroglycerin patch applied, daughter at bedside, wound care evaluated r leg and placed wound vac. L leg has non removable post operative graft dressing unna boot in place and remains intact and unevaluated by nursing staff at this time. On acute oxygen, FSBS elevated, insulin given, c/o r leg pain improved with percocet. Uneventful shift.

## 2015-11-05 NOTE — Progress Notes (Signed)
[place] [facility] [place] NAME: [name]    MR#:  [id number]  DATE OF BIRTH:  02-20-1946  SUBJECTIVE:  CHIEF COMPLAINT:   Chief Complaint  Patient presents with  . Shortness of Breath    On oxygen, but now does not have any wheezing.   Blood sugar running high- as she did not take her last night dose. Have wound vac on right leg.   BP under control.  REVIEW OF SYSTEMS:  CONSTITUTIONAL: No fever, fatigue or weakness.  EYES: No blurred or double vision.  EARS, NOSE, AND THROAT: No tinnitus or ear pain.  RESPIRATORY: No cough, shortness of breath, wheezing or hemoptysis.  CARDIOVASCULAR: No chest pain, orthopnea, edema.  GASTROINTESTINAL: No nausea, vomiting, diarrhea or abdominal pain.  GENITOURINARY: No dysuria, hematuria.  ENDOCRINE: No polyuria, nocturia,  HEMATOLOGY: No anemia, easy bruising or bleeding SKIN: No rash or lesion. MUSCULOSKELETAL: No joint pain or arthritis.   NEUROLOGIC: No tingling, numbness, weakness.  PSYCHIATRY: No anxiety or depression.   ROS  DRUG ALLERGIES:   Allergies  Allergen Reactions  . Doxycycline Other (See Comments)    Reaction: unknown  . Levaquin [Levofloxacin] Other (See Comments)    Causes joint pain  . Clindamycin/Lincomycin Itching    Petechia  . Penicillins Rash and Other (See Comments)    Has patient had a PCN reaction causing immediate rash, facial/tongue/throat swelling, SOB or lightheadedness with hypotension: Yes Has patient had a PCN reaction causing severe rash involving mucus membranes or skin necrosis: No Has patient had a PCN reaction that required hospitalization No Has patient had a PCN reaction occurring within the last 10 years: No If all of the above answers are "NO", then may proceed with Cephalosporin use.  . Sulfa Antibiotics Swelling and Rash    VITALS:  Blood pressure 131/50, pulse 94, temperature 98.2 F (36.8 C), temperature source Oral, resp. rate 20,  height 5\' 5"  (1.651 m), weight 97.665 kg (215 lb 5 oz), SpO2 100 %.  PHYSICAL EXAMINATION:  GENERAL:  70 y.o.-year-old obase  patient lying in the bed with no acute distress.  EYES: Pupils equal, round, reactive to light and accommodation. No scleral icterus. Extraocular muscles intact.  HEENT: Head atraumatic, normocephalic. Oropharynx and nasopharynx clear.  NECK:  Supple, no jugular venous distention. No thyroid enlargement, no tenderness.  LUNGS: Normal breath sounds bilaterally, no wheezing, rales,rhonchi or crepitation. No use of accessory muscles of respiration.  CARDIOVASCULAR: S1, S2 normal. No murmurs, rubs, or gallops.  ABDOMEN: Soft, nontender, nondistended. Bowel sounds present. No organomegaly or mass.  EXTREMITIES: left leg- dressing present.   Right leg- have a wound vac present. NEUROLOGIC: Cranial nerves II through XII are intact. Muscle strength 5/5 in all extremities. Sensation intact. Gait not checked.  PSYCHIATRIC: The patient is alert and oriented x 3.  SKIN: No obvious rash, lesion, or ulcer.   Physical Exam LABORATORY PANEL:   CBC  Recent Labs Lab 11/05/15 0512  WBC 13.1*  HGB 11.0*  HCT 33.7*  PLT 199   ------------------------------------------------------------------------------------------------------------------  Chemistries   Recent Labs Lab 11/05/15 0512  NA 134*  K 4.1  CL 97*  CO2 25  GLUCOSE 548*  BUN 28*  CREATININE 1.93*  CALCIUM 9.5   ------------------------------------------------------------------------------------------------------------------  Cardiac Enzymes  Recent Labs Lab 11/05/15 0512 11/05/15 1209  TROPONINI 0.03 0.16*   ------------------------------------------------------------------------------------------------------------------  RADIOLOGY:  Dg Chest Port 1 View  11/05/2015  CLINICAL DATA:  70 year old female with respiratory distress  EXAM: PORTABLE CHEST 1 VIEW COMPARISON:  Radiograph dated 05/20/2014  FINDINGS: There is stable cardiomegaly. Minimal bibasilar atelectatic changes. No focal consolidation, pleural effusion, or pneumothorax. The osseous structures appear unremarkable. IMPRESSION: No active disease. Electronically Signed   By: Anner Crete M.D.   On: 11/05/2015 06:10    ASSESSMENT AND PLAN:   Principal Problem:   Uncontrolled hypertension Active Problems:   Uncontrolled diabetes mellitus ([place])   * Uncontrolled Htn   Hydralazine inj given.   Now resumed Home meds- and BP stable.  * Uncontrolled DM   Missed last night dose.   Given 10 units this am- lantus.   Resume 40 unit night dose of lantus tonight.   Meanwhile today blood sugar might stay little higher- as she missed  Basal dose.  * Hypoxia- ac respi failure   Likely due to bronchitis or may have underlying COPD-as she have a long smoking history.   Nebs, Currently no wheezing.    Try to wean to room air.  * Leg ulcer and wound vac   Wound care saw her.   She follows in office - and has plans for skin graftings.   NO signs of infection.   Continue Abx.       All the records are reviewed and case discussed with Care Management/Social Workerr. Management plans discussed with the patient, family and they are in agreement.  CODE STATUS: Full  TOTAL TIME TAKING CARE OF THIS PATIENT: 35 minutes.   Discussed with her daughter in room.  POSSIBLE D/C IN 1-2 DAYS, DEPENDING ON CLINICAL CONDITION.   Vaughan Basta M.D on 11/05/2015   Between 7am to 6pm - Pager - [phone]  After 6pm go to [email or web address] - password [email or web address] [facility]  Office  [phone]  CC: Primary care physician; Glendon Axe, MD  Note: This dictation was prepared with Dragon dictation along with smaller phrase technology. Any transcriptional errors that result from this process are unintentional.

## 2015-11-05 NOTE — Clinical Social Work Note (Signed)
Clinical Social Worker consulted for [employer]. CSW referred pt to chaplin to follow up. CSW is signing off as no further needs identified. Please reconsult if a need arises prior to discharge.   Darden Dates, MSW, LCSW Clinical social Worker [phone]

## 2015-11-05 NOTE — ED Provider Notes (Signed)
[facility] Emergency Department Provider Note  ____________________________________________  Time seen: : 5:15 AM  I have reviewed the triage vital signs and the nursing notes.   HISTORY  Chief Complaint No chief complaint on file.      HPI [name] is a 70 y.o. female presents with acute onset of respiratory distress on awakening this morning. Per EMS on the presentation of patient's oxygen saturation was in the 70s with improvement to the 90s status post DuoNeb and albuterol treatment. Patient also admits to some chest tightness. In addition patient admits to three-day history of a vomiting and diarrhea. Patient's currently being treated for a wound on her right leg with a wound VAC in place. Patient's blood pressure per EMS was 230/120 on their arrival.    Past Medical History  Diagnosis Date  . Diabetes mellitus without complication ([place])     Type II  . Ulcer of left lower leg [facility]) February 07, 2015  . Chronic renal insufficiency February 07, 2015  . Coronary artery dilation ([place])   . Hypertension   . Hyperlipidemia   . Arthritis     Inflammatory  . Thyroid disease   . Sinus problem     Patient Active Problem List   Diagnosis Date Noted  . 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 Varicose veins of left lower extremity with ulcer of calf ([place]) 08/04/2015    Past Surgical History  Procedure Laterality Date  . Abdominal hysterectomy    . Dilation and curettage of uterus  1984  . Partial hysterectomy  1985  . Rotator cuff repair  2004  . Cyst excision    . Cataract extraction w/phaco Right 03/11/2015    Procedure: CATARACT EXTRACTION PHACO AND INTRAOCULAR LENS PLACEMENT (IOC);  Surgeon: Birder Robson, MD;  Location: [facility] ORS;  Service: Ophthalmology;  Laterality: Right;  [place] 00:40 AP% 24.3 CDE 9.86  . I&d  extremity Right 09/04/2015    Procedure: IRRIGATION AND DEBRIDEMENT EXTREMITY/ AND BIOPSY;  Surgeon: Algernon Huxley, MD;  Location: [facility] ORS;  Service: Vascular;  Laterality: Right;    Current Outpatient Rx  Name  Route  Sig  Dispense  Refill  . acetaminophen (TYLENOL) 500 MG tablet   Oral   Take 500 mg by mouth every 6 (six) hours as needed.         Marland Kitchen amLODipine (NORVASC) 5 MG tablet   Oral   Take 5 mg by mouth daily.         Marland Kitchen aspirin 81 MG tablet   Oral   Take 81 mg by mouth daily.         . Calcium Carb-Cholecalciferol (CALCIUM + D3 PO)   Oral   Take by mouth daily.         . cephALEXin (KEFLEX) 500 MG capsule   Oral   Take 1 capsule (500 mg total) by mouth 4 (four) times daily. Patient not taking: Reported on 09/04/2015   28 capsule   0   . ciprofloxacin (CIPRO) 500 MG tablet   Oral   Take 500 mg by mouth 2 (two) times daily.         . famotidine (PEPCID) 20 MG tablet   Oral   Take 1 tablet (20 mg total) by mouth 2 (two) times daily. Patient not taking: Reported on 09/04/2015   8 tablet  0   . furosemide (LASIX) 20 MG tablet   Oral   Take 20 mg by mouth daily.          . hydrochlorothiazide (HYDRODIURIL) 25 MG tablet   Oral   Take 25 mg by mouth daily.         Marland Kitchen HYDROcodone-acetaminophen (NORCO) 5-325 MG tablet   Oral   Take 1 tablet by mouth every 6 (six) hours as needed for moderate pain.   30 tablet   0   . insulin aspart (NOVOLOG) 100 UNIT/ML injection   Subcutaneous   Inject 12 Units into the skin 3 (three) times daily before meals.         . insulin glargine (LANTUS) 100 UNIT/ML injection   Subcutaneous   Inject 32 Units into the skin at bedtime.          Marland Kitchen loratadine (CLARITIN) 10 MG tablet   Oral   Take 10 mg by mouth daily.         . Magnesium 400 MG CAPS   Oral   Take by mouth 2 (two) times daily.         . metoprolol (LOPRESSOR) 50 MG tablet   Oral   Take 50 mg by mouth 2 (two) times daily.         Marland Kitchen  omeprazole (PRILOSEC) 20 MG capsule   Oral   Take 20 mg by mouth daily.         . potassium chloride (K-DUR) 10 MEQ tablet   Oral   Take 10 mEq by mouth daily.         Marland Kitchen PREDNISOLONE PO   Oral   Take 15 mg by mouth daily.         . predniSONE (DELTASONE) 20 MG tablet      40mg  daily x 4 days   8 tablet   0     Allergies Ciprofloxacin; Doxycycline; Levaquin; Clindamycin/lincomycin; Penicillins; and Sulfa antibiotics  No family history on file.  Social History Social History  Substance Use Topics  . Smoking status: Former Smoker -- 46 years  . Smokeless tobacco: Never Used  . Alcohol Use: No    Review of Systems  Constitutional: Negative for fever. Eyes: Negative for visual changes. ENT: Negative for sore throat. Cardiovascular: Positive for chest pain. Respiratory: Positive for shortness of breath. Gastrointestinal: Negative for abdominal pain. positive for vomiting and diarrhea. Genitourinary: Negative for dysuria. Musculoskeletal: Negative for back pain. Skin: Negative for rash. Neurological: Negative for headaches, focal weakness or numbness.   10-point ROS otherwise negative.  ____________________________________________   PHYSICAL EXAM:  VITAL SIGNS: ED Triage Vitals  Enc Vitals Group     BP --      Pulse --      Resp --      Temp --      Temp src --      SpO2 --      Weight --      Height --      Head Cir --      Peak Flow --      Pain Score --      Pain Loc --      Pain Edu? --      Excl. in [place]? --      Constitutional: Alert and oriented. Apparent respiratory distress Eyes: Conjunctivae are normal. PERRL. Normal extraocular movements. ENT   Head: Normocephalic and atraumatic.   Nose: No congestion/rhinnorhea.   Mouth/Throat: Mucous membranes are  moist.   Neck: No stridor. Hematological/Lymphatic/Immunilogical: No cervical lymphadenopathy. Cardiovascular: Normal rate, regular rhythm. Normal and symmetric distal  pulses are present in all extremities. No murmurs, rubs, or gallops. Respiratory: Tachypnea, positive accessory respiratory muscle use, bibasilar rales upper lobe rhonchi Gastrointestinal: Soft and nontender. No distention. There is no CVA tenderness. Genitourinary: deferred Musculoskeletal: Nontender with normal range of motion in all extremities. No joint effusions.  No lower extremity tenderness nor edema. Neurologic:  Normal speech and language. No gross focal neurologic deficits are appreciated. Speech is normal.  Skin:  Skin is warm, dry and intact. No rash noted. Psychiatric: Mood and affect are normal. Speech and behavior are normal. Patient exhibits appropriate insight and judgment.  ____________________________________________    LABS (pertinent positives/negatives)  Labs Reviewed  BASIC METABOLIC PANEL - Abnormal; Notable for the following:    Sodium 134 (*)    Chloride 97 (*)    Glucose, Bld 548 (*)    BUN 28 (*)    Creatinine, Ser 1.93 (*)    GFR calc non Af Amer 25 (*)    GFR calc Af Amer 29 (*)    All other components within normal limits  CBC - Abnormal; Notable for the following:    WBC 13.1 (*)    Hemoglobin 11.0 (*)    HCT 33.7 (*)    All other components within normal limits  URINALYSIS COMPLETEWITH MICROSCOPIC ([facility] ONLY) - Abnormal; Notable for the following:    Color, Urine COLORLESS (*)    APPearance CLEAR (*)    Glucose, UA >500 (*)    Hgb urine dipstick 2+ (*)    Bacteria, UA RARE (*)    All other components within normal limits  BRAIN NATRIURETIC PEPTIDE - Abnormal; Notable for the following:    B Natriuretic Peptide 182.0 (*)    All other components within normal limits  GLUCOSE, CAPILLARY - Abnormal; Notable for the following:    Glucose-Capillary 417 (*)    All other components within normal limits  TROPONIN I - Abnormal; Notable for the following:    Troponin I 0.16 (*)    All other components within normal limits  TROPONIN I - Abnormal;  Notable for the following:    Troponin I 0.19 (*)    All other components within normal limits  TROPONIN I - Abnormal; Notable for the following:    Troponin I 0.15 (*)    All other components within normal limits  GLUCOSE, CAPILLARY - Abnormal; Notable for the following:    Glucose-Capillary 404 (*)    All other components within normal limits  HEMOGLOBIN A1C - Abnormal; Notable for the following:    Hgb A1c MFr Bld 10.4 (*)    All other components within normal limits  GLUCOSE, CAPILLARY - Abnormal; Notable for the following:    Glucose-Capillary 414 (*)    All other components within normal limits  GLUCOSE, CAPILLARY - Abnormal; Notable for the following:    Glucose-Capillary 424 (*)    All other components within normal limits  GLUCOSE, CAPILLARY - Abnormal; Notable for the following:    Glucose-Capillary 348 (*)    All other components within normal limits  CBC - Abnormal; Notable for the following:    RBC 3.28 (*)    Hemoglobin 9.4 (*)    HCT 28.1 (*)    All other components within normal limits  GLUCOSE, CAPILLARY - Abnormal; Notable for the following:    Glucose-Capillary 354 (*)    All other components within  normal limits  GLUCOSE, CAPILLARY - Abnormal; Notable for the following:    Glucose-Capillary 293 (*)    All other components within normal limits  BASIC METABOLIC PANEL - Abnormal; Notable for the following:    Sodium 134 (*)    Potassium 3.2 (*)    Chloride 99 (*)    Glucose, Bld 253 (*)    BUN 36 (*)    Creatinine, Ser 3.39 (*)    GFR calc non Af Amer 13 (*)    GFR calc Af Amer 15 (*)    All other components within normal limits  GLUCOSE, CAPILLARY - Abnormal; Notable for the following:    Glucose-Capillary 178 (*)    All other components within normal limits  GLUCOSE, CAPILLARY - Abnormal; Notable for the following:    Glucose-Capillary 239 (*)    All other components within normal limits  MAGNESIUM - Abnormal; Notable for the following:    Magnesium  1.5 (*)    All other components within normal limits  BASIC METABOLIC PANEL - Abnormal; Notable for the following:    Sodium 134 (*)    Chloride 100 (*)    Glucose, Bld 162 (*)    BUN 39 (*)    Creatinine, Ser 3.95 (*)    GFR calc non Af Amer 11 (*)    GFR calc Af Amer 12 (*)    All other components within normal limits  CBC - Abnormal; Notable for the following:    RBC 3.22 (*)    Hemoglobin 9.3 (*)    HCT 27.6 (*)    All other components within normal limits  GLUCOSE, CAPILLARY - Abnormal; Notable for the following:    Glucose-Capillary 220 (*)    All other components within normal limits  C DIFFICILE QUICK SCREEN W PCR REFLEX  TROPONIN I     ____________________________________________   EKG ED ECG REPORT I, BROWN,  N, the attending physician, personally viewed and interpreted this ECG.   Date: 11/07/2015  EKG Time: 5:32 AM  Rate: 128  Rhythm: Sinus tachycardia  Axis: None  Intervals: Normal  ST&T Change: None   ____________________________________________    RADIOLOGY     DG Chest Port 1 View (Final result) Result time: 11/05/15 06:10:56   Final result by Rad Results In Interface (11/05/15 06:10:56)   Narrative:   CLINICAL DATA: 70 year old female with respiratory distress  EXAM: PORTABLE CHEST 1 VIEW  COMPARISON: Radiograph dated 05/20/2014  FINDINGS: There is stable cardiomegaly. Minimal bibasilar atelectatic changes. No focal consolidation, pleural effusion, or pneumothorax. The osseous structures appear unremarkable.  IMPRESSION: No active disease.   Electronically Signed By: Anner Crete M.D. On: 11/05/2015 06:10          Critical Care performed: CRITICAL CARE Performed by: Marjean Donna N   Total critical care time: 30 minutes  Critical care time was exclusive of separately billable procedures and treating other patients.  Critical care was necessary to treat or prevent imminent or life-threatening  deterioration.  Critical care was time spent personally by me on the following activities: development of treatment plan with patient and/or surrogate as well as nursing, discussions with consultants, evaluation of patient's response to treatment, examination of patient, obtaining history from patient or surrogate, ordering and performing treatments and interventions, ordering and review of laboratory studies, ordering and review of radiographic studies, pulse oximetry and re-evaluation of patient's condition.   ____________________________________________   INITIAL IMPRESSION / ASSESSMENT AND PLAN / ED COURSE  Pertinent labs & imaging  results that were available during my care of the patient were reviewed by me and considered in my medical decision making (see chart for details). Patient received multiple DuoNeb's, nitroglycerin 2% ointment 0.5 inch applied   ____________________________________________   FINAL CLINICAL IMPRESSION(S) / ED DIAGNOSES  Final diagnoses:  None   Acute Respiratory Failure  hypertensive emergency  Gregor Hams, MD 11/07/15 [phone]

## 2015-11-05 NOTE — ED Notes (Signed)
Pt short of breath.  Per EMS pt BP 230/120 en route.  Pt had breathing treatment x1 and was breathing better at arrival.  Per pt she has also been nauseated and throwing up x 3 days.

## 2015-11-05 NOTE — H&P (Addendum)
[name] [place] NAME: [name]    MR#:  [id number]  DATE OF BIRTH:  [birth year]  DATE OF ADMISSION:  11/05/2015  PRIMARY CARE PHYSICIAN: Glendon Axe, MD   REQUESTING/REFERRING PHYSICIAN:   CHIEF COMPLAINT:   Chief Complaint  Patient presents with  . Shortness of Breath    HISTORY OF PRESENT ILLNESS: [name]  is a 70 y.o. female with a known history of diabetes mellitus type 2, chronic kidney disease, coronary artery disease, hypertension, hyperlipidemia, lower extremity ulcers presented to the emergency room with difficulty breathing. Patient when she arrived in the emergency room was hypoxic with O2 sat around 70%. She was put on oxygen via nasal cannula and nebulizer treatment was given. No history of any tobacco abuse currently. No history of any COPD. Patient does not complain of any chest pain. Her blood pressure was high when she presented to the emergency room systolic blood pressure was around 190 mmHg. An inch of Nitropaste was put blood pressure came down. On evaluation in the emergency room patient's blood sugar was also high diabetes has been uncontrolled. Patient has a right lower extremity wound and has a wound VAC.Has an episode of vomiting and vomitus contained food and water particles. No history of any orthopnea or paroxysmal nocturnal dyspnea. No fever or chills or cough.  PAST MEDICAL HISTORY:   Past Medical History  Diagnosis Date  . Diabetes mellitus without complication ([place])     Type II  . Ulcer of left lower leg [facility]) February 07, 2015  . Chronic renal insufficiency February 07, 2015  . Coronary artery dilation ([place])   . Hypertension   . Hyperlipidemia   . Arthritis     Inflammatory  . Thyroid disease   . Sinus problem     PAST SURGICAL HISTORY: Past Surgical History  Procedure Laterality Date  . Abdominal hysterectomy    . Dilation and curettage of uterus  1984  . Partial hysterectomy  1985  .  Rotator cuff repair  2004  . Cyst excision    . Cataract extraction w/phaco Right 03/11/2015    Procedure: CATARACT EXTRACTION PHACO AND INTRAOCULAR LENS PLACEMENT (IOC);  Surgeon: Birder Robson, MD;  Location: [facility] ORS;  Service: Ophthalmology;  Laterality: Right;  [place] 00:40 AP% 24.3 CDE 9.86  . I&d extremity Right 09/04/2015    Procedure: IRRIGATION AND DEBRIDEMENT EXTREMITY/ AND BIOPSY;  Surgeon: Algernon Huxley, MD;  Location: [facility] ORS;  Service: Vascular;  Laterality: Right;    SOCIAL HISTORY:  Social History  Substance Use Topics  . Smoking status: Former Smoker -- 33 years  . Smokeless tobacco: Never Used  . Alcohol Use: No    FAMILY HISTORY:  Family History  Problem Relation Age of Onset  . Diabetes Mellitus II Mother   . Diabetes Mellitus II Father     DRUG ALLERGIES:  Allergies  Allergen Reactions  . Doxycycline Other (See Comments)    Reaction: unknown  . Levaquin [Levofloxacin] Other (See Comments)    Causes joint pain  . Clindamycin/Lincomycin Itching    Petechia  . Penicillins Rash and Other (See Comments)    Has patient had a PCN reaction causing immediate rash, facial/tongue/throat swelling, SOB or lightheadedness with hypotension: Yes Has patient had a PCN reaction causing severe rash involving mucus membranes or skin necrosis: No Has patient had a PCN reaction that required hospitalization No Has patient had a PCN reaction occurring within the last 10 years:  No If all of the above answers are "NO", then may proceed with Cephalosporin use.  . Sulfa Antibiotics Swelling and Rash    REVIEW OF SYSTEMS:   CONSTITUTIONAL: No fever, fatigue or weakness.  EYES: No blurred or double vision.  EARS, NOSE, AND THROAT: No tinnitus or ear pain.  RESPIRATORY: No cough, Has shortness of breath,no wheezing or hemoptysis.  CARDIOVASCULAR: No chest pain, orthopnea, edema.  GASTROINTESTINAL: No nausea, vomiting, diarrhea or abdominal pain.  GENITOURINARY: No dysuria,  hematuria.  ENDOCRINE: No polyuria, nocturia,  HEMATOLOGY: No anemia, easy bruising or bleeding SKIN: Right lower extremity wound with wound vac MUSCULOSKELETAL: No joint pain or arthritis.   NEUROLOGIC: No tingling, numbness, weakness.  PSYCHIATRY: No anxiety or depression.   MEDICATIONS AT HOME:  Prior to Admission medications   Medication Sig Start Date End Date Taking? Authorizing Provider  acetaminophen (TYLENOL) 500 MG tablet Take 500 mg by mouth every 6 (six) hours as needed for mild pain, fever or headache.    Yes Historical Provider, MD  amLODipine (NORVASC) 5 MG tablet Take 5 mg by mouth daily.   Yes Historical Provider, MD  aspirin 81 MG tablet Take 81 mg by mouth 2 (two) times daily.    Yes Historical Provider, MD  atorvastatin (LIPITOR) 40 MG tablet Take 40 mg by mouth daily.   Yes Historical Provider, MD  azaTHIOprine (IMURAN) 50 MG tablet Take 50 mg by mouth daily.   Yes Historical Provider, MD  calcium-vitamin D (OSCAL WITH D) 250-125 MG-UNIT tablet Take 1 tablet by mouth daily.   Yes Historical Provider, MD  cholecalciferol (VITAMIN D) 1000 units tablet Take 1,000 Units by mouth daily.   Yes Historical Provider, MD  collagenase (SANTYL) ointment Apply 1 application topically daily.   Yes Historical Provider, MD  furosemide (LASIX) 20 MG tablet Take 40 mg by mouth 2 (two) times daily.    Yes Historical Provider, MD  HYDROcodone-acetaminophen (NORCO) 5-325 MG tablet Take 1 tablet by mouth every 6 (six) hours as needed for moderate pain. Patient taking differently: Take 0.5 tablets by mouth every 8 (eight) hours as needed for moderate pain.  09/04/15  Yes Algernon Huxley, MD  insulin glargine (LANTUS) 100 UNIT/ML injection Inject 50 Units into the skin at bedtime.    Yes Historical Provider, MD  insulin lispro (HUMALOG) 100 UNIT/ML injection Inject 5-10 Units into the skin 3 (three) times daily before meals. 5 units in the morning, 6 units before lunch and 10 units before dinner    Yes Historical Provider, MD  loratadine (CLARITIN) 10 MG tablet Take 10 mg by mouth daily.   Yes Historical Provider, MD  magnesium oxide (MAG-OX) 400 MG tablet Take 400 mg by mouth 2 (two) times daily.   Yes Historical Provider, MD  omeprazole (PRILOSEC) 20 MG capsule Take 20 mg by mouth daily.   Yes Historical Provider, MD  pioglitazone (ACTOS) 15 MG tablet Take 15 mg by mouth daily.   Yes Historical Provider, MD  potassium chloride (K-DUR) 10 MEQ tablet Take 10 mEq by mouth daily.   Yes Historical Provider, MD  predniSONE (DELTASONE) 10 MG tablet Take 35 mg by mouth daily with breakfast.   Yes Historical Provider, MD  cephALEXin (KEFLEX) 500 MG capsule Take 1 capsule (500 mg total) by mouth 4 (four) times daily. Patient not taking: Reported on 09/04/2015 05/10/15   Paulette Blanch, MD  famotidine (PEPCID) 20 MG tablet Take 1 tablet (20 mg total) by mouth 2 (two) times daily.  Patient not taking: Reported on 09/04/2015 05/10/15   Paulette Blanch, MD  predniSONE (DELTASONE) 20 MG tablet 40mg  daily x 4 days Patient not taking: Reported on 11/05/2015 05/10/15   Paulette Blanch, MD      PHYSICAL EXAMINATION:   VITAL SIGNS: Blood pressure 116/55, pulse 114, resp. rate 26, SpO2 97 %.  GENERAL:  70 y.o.-year-old patient lying in the bed with no acute distress.  EYES: Pupils equal, round, reactive to light and accommodation. No scleral icterus. Extraocular muscles intact.  HEENT: Head atraumatic, normocephalic. Oropharynx and nasopharynx clear.  NECK:  Supple, no jugular venous distention. No thyroid enlargement, no tenderness.  LUNGS: Normal breath sounds bilaterally, no wheezing,scattered rales heard,no rhonchi or crepitation. No use of accessory muscles of respiration.  CARDIOVASCULAR: S1, S2 normal. No murmurs, rubs, or gallops.  ABDOMEN: Soft, nontender, nondistended. Bowel sounds present. No organomegaly or mass.  EXTREMITIES: Left lower extremity skin graft noted,right lower extremity wound with wound vac  noted. NEUROLOGIC: Cranial nerves II through XII are intact. Muscle strength 5/5 in all extremities. Sensation intact. No cerebellar signs noted. PSYCHIATRIC: The patient is alert and oriented x 3.  SKIN: right lower extremity wound with wound vac   LABORATORY PANEL:   CBC  Recent Labs Lab 11/05/15 0512  WBC 13.1*  HGB 11.0*  HCT 33.7*  PLT 199  MCV 86.7  MCH 28.3  MCHC 32.7  RDW 13.4   ------------------------------------------------------------------------------------------------------------------  Chemistries   Recent Labs Lab 11/05/15 0512  NA 134*  K 4.1  CL 97*  CO2 25  GLUCOSE 548*  BUN 28*  CREATININE 1.93*  CALCIUM 9.5   ------------------------------------------------------------------------------------------------------------------ CrCl cannot be calculated (Unknown ideal weight.). ------------------------------------------------------------------------------------------------------------------ No results for input(s): TSH, T4TOTAL, T3FREE, THYROIDAB in the last 72 hours.  Invalid input(s): FREET3   Coagulation profile No results for input(s): INR, PROTIME in the last 168 hours. ------------------------------------------------------------------------------------------------------------------- No results for input(s): DDIMER in the last 72 hours. -------------------------------------------------------------------------------------------------------------------  Cardiac Enzymes  Recent Labs Lab 11/05/15 0512  TROPONINI 0.03   ------------------------------------------------------------------------------------------------------------------ Invalid input(s): POCBNP  ---------------------------------------------------------------------------------------------------------------  Urinalysis    Component Value Date/Time   COLORURINE COLORLESS* 11/05/2015 0534   COLORURINE Colorless 04/23/2014 1137   APPEARANCEUR CLEAR* 11/05/2015 0534    APPEARANCEUR Clear 04/23/2014 1137   LABSPEC 1.006 11/05/2015 0534   LABSPEC 1.004 04/23/2014 1137   PHURINE 5.0 11/05/2015 0534   PHURINE 8.0 04/23/2014 1137   GLUCOSEU >500* 11/05/2015 0534   GLUCOSEU Negative 04/23/2014 1137   HGBUR 2+* 11/05/2015 0534   HGBUR 1+ 04/23/2014 1137   BILIRUBINUR NEGATIVE 11/05/2015 0534   BILIRUBINUR Negative 04/23/2014 [place] 11/05/2015 0534   KETONESUR Negative 04/23/2014 1137   PROTEINUR NEGATIVE 11/05/2015 0534   PROTEINUR Negative 04/23/2014 1137   NITRITE NEGATIVE 11/05/2015 0534   NITRITE Negative 04/23/2014 1137   LEUKOCYTESUR NEGATIVE 11/05/2015 0534   LEUKOCYTESUR Trace 04/23/2014 1137     RADIOLOGY: Dg Chest Port 1 View  11/05/2015  CLINICAL DATA:  70 year old female with respiratory distress EXAM: PORTABLE CHEST 1 VIEW COMPARISON:  Radiograph dated 05/20/2014 FINDINGS: There is stable cardiomegaly. Minimal bibasilar atelectatic changes. No focal consolidation, pleural effusion, or pneumothorax. The osseous structures appear unremarkable. IMPRESSION: No active disease. Electronically Signed   By: Anner Crete M.D.   On: 11/05/2015 06:10    EKG: Orders placed or performed during the hospital encounter of 11/05/15  . ED EKG  . ED EKG  . EKG 12-Lead  . EKG 12-Lead    IMPRESSION  AND PLAN: 70 year old female patient with history of for diabetes mellitus, hypertension, coronary artery disease, chronic kidney disease presented to the emergency room with difficulty breathing. Upon evaluation patient was found to have elevated blood pressure and uncontrolled blood sugars. Admitting diagnosis 1. Uncontrolled hypertension 2. Uncontrolled diabetes mellitus 3. Dyspnea rule out heart failure 4. Coronary artery disease 5. Chronic kidney disease Treatment plan Admit patient to stepdown unit telemetry monitoring And total blood sugars with Lantus insulin and NovoLog sliding scale coverage Diabetic diet Control blood  pressure with the oral norvasc, hydralazine IV as needed Cycle troponin to rule out ischemia Antiemetics Check echocardiogram. Wound care consultation Follow-up WBC count   All the records are reviewed and case discussed with ED provider. Management plans discussed with the patient, family and they are in agreement.  CODE STATUS:FULL    Code Status Orders        Start     Ordered   11/05/15 0715  Full code   Continuous     11/05/15 0714    Code Status History    Date Active Date Inactive Code Status Order ID Comments User Context   This patient has a current code status but no historical code status.       TOTAL TIME TAKING CARE OF THIS PATIENT: 50 minutes.    Saundra Shelling M.D on 11/05/2015 at 7:18 AM  Between 7am to 6pm - Pager - [phone]  After 6pm go to [email or web address] - password [email or web address] [facility]  Office  [phone]  CC: Primary care physician; Glendon Axe, MD

## 2015-11-05 NOTE — ED Notes (Signed)
Removed nitropaste per Dr Owens Shark request.

## 2015-11-06 LAB — BASIC METABOLIC PANEL
Anion gap: 10 (ref 5–15)
BUN: 36 mg/dL — AB (ref 6–20)
CALCIUM: 9.3 mg/dL (ref 8.9–10.3)
CO2: 25 mmol/L (ref 22–32)
CREATININE: 3.39 mg/dL — AB (ref 0.44–1.00)
Chloride: 99 mmol/L — ABNORMAL LOW (ref 101–111)
GFR, EST AFRICAN AMERICAN: 15 mL/min — AB (ref >60)
GFR, EST NON AFRICAN AMERICAN: 13 mL/min — AB (ref >60)
Glucose, Bld: 253 mg/dL — ABNORMAL HIGH (ref 65–99)
Potassium: 3.2 mmol/L — ABNORMAL LOW (ref 3.5–5.1)
SODIUM: 134 mmol/L — AB (ref 135–145)

## 2015-11-06 LAB — GLUCOSE, CAPILLARY
GLUCOSE-CAPILLARY: 178 mg/dL — AB (ref 65–99)
GLUCOSE-CAPILLARY: 239 mg/dL — AB (ref 65–99)
GLUCOSE-CAPILLARY: 220 mg/dL — AB (ref 65–99)
Glucose-Capillary: 293 mg/dL — ABNORMAL HIGH (ref 65–99)

## 2015-11-06 LAB — CBC
HEMATOCRIT: 28.1 % — AB (ref 35.0–47.0)
Hemoglobin: 9.4 g/dL — ABNORMAL LOW (ref 12.0–16.0)
MCH: 28.8 pg (ref 26.0–34.0)
MCHC: 33.6 g/dL (ref 32.0–36.0)
MCV: 85.6 fL (ref 80.0–100.0)
PLATELETS: 194 10*3/uL (ref 150–440)
RBC: 3.28 MIL/uL — AB (ref 3.80–5.20)
RDW: 13.4 % (ref 11.5–14.5)
WBC: 9 10*3/uL (ref 3.6–11.0)

## 2015-11-06 MED ORDER — HEPARIN SODIUM (PORCINE) 5000 UNIT/ML IJ SOLN
5000.0000 [IU] | Freq: Two times a day (BID) | INTRAMUSCULAR | Status: DC
  Administered 2015-11-06 – 2015-11-10 (×7): 5000 [IU] via SUBCUTANEOUS
  Filled 2015-11-06 (×7): qty 1

## 2015-11-06 MED ORDER — ONDANSETRON HCL 4 MG/2ML IJ SOLN
4.0000 mg | INTRAMUSCULAR | Status: DC | PRN
  Administered 2015-11-06 – 2015-11-09 (×5): 4 mg via INTRAVENOUS
  Filled 2015-11-06 (×5): qty 2

## 2015-11-06 MED ORDER — PREDNISONE 5 MG PO TABS
35.0000 mg | ORAL_TABLET | Freq: Every day | ORAL | Status: DC
  Administered 2015-11-07 – 2015-11-10 (×2): 35 mg via ORAL
  Filled 2015-11-06 (×2): qty 1

## 2015-11-06 MED ORDER — POTASSIUM CHLORIDE CRYS ER 20 MEQ PO TBCR
40.0000 meq | EXTENDED_RELEASE_TABLET | Freq: Once | ORAL | Status: DC
  Filled 2015-11-06: qty 2

## 2015-11-06 MED ORDER — POTASSIUM CHLORIDE CRYS ER 20 MEQ PO TBCR
40.0000 meq | EXTENDED_RELEASE_TABLET | Freq: Once | ORAL | Status: DC
  Filled 2015-11-06 (×2): qty 2

## 2015-11-06 MED ORDER — ALBUTEROL SULFATE (2.5 MG/3ML) 0.083% IN NEBU: 2.5000 mg | INHALATION_SOLUTION | Freq: Four times a day (QID) | RESPIRATORY_TRACT | Status: DC | PRN

## 2015-11-06 MED ORDER — PROMETHAZINE HCL 25 MG/ML IJ SOLN
12.5000 mg | Freq: Four times a day (QID) | INTRAMUSCULAR | Status: DC | PRN
  Administered 2015-11-09: 23:00:00 12.5 mg via INTRAVENOUS
  Filled 2015-11-06: qty 1

## 2015-11-06 MED ORDER — MAGNESIUM OXIDE 400 (241.3 MG) MG PO TABS
400.0000 mg | ORAL_TABLET | Freq: Every day | ORAL | Status: DC
  Administered 2015-11-07 – 2015-11-10 (×4): 400 mg via ORAL
  Filled 2015-11-06 (×5): qty 1

## 2015-11-06 MED ORDER — BISACODYL 10 MG RE SUPP
10.0000 mg | Freq: Every day | RECTAL | Status: DC
  Administered 2015-11-06 – 2015-11-08 (×3): 10 mg via RECTAL
  Filled 2015-11-06 (×9): qty 1

## 2015-11-06 MED ORDER — SODIUM CHLORIDE 0.9 % IV SOLN
INTRAVENOUS | Status: AC
  Administered 2015-11-06 – 2015-11-07 (×2): via INTRAVENOUS

## 2015-11-06 NOTE — Progress Notes (Signed)
Notified Dr. Margaretmary Eddy that pt reports that she is urinating less adn the bun and creatinine in increased from previous day, MD acknowledged.

## 2015-11-06 NOTE — Progress Notes (Signed)
Alert and oriented, nauseated and vomiting, on room air, bun/ creatinine elevated,poor urine output- MD notified, started on IV fluids, zofran for nausea with little improvement, phenergan ordered to prn medications, made NPO to prevent aspiration, update provided to daughter via telephone. Pt unable to tolerate po pills, refuses suppository at this time, continues on enteric precautions, no bm throughout shift.

## 2015-11-06 NOTE — Progress Notes (Signed)
[facility] [facility] [place] NAME: [name]    MR#:  [id number]  DATE OF BIRTH:  June 16, 1946  SUBJECTIVE:  CHIEF COMPLAINT:   Chief Complaint  Patient presents with  . Shortness of Breath    On oxygen, but now does not have any wheezing.   Patient is nauseous, vomiting. Not feeling good. Denies any chest pain  REVIEW OF SYSTEMS:  CONSTITUTIONAL: No fever, fatigue or weakness.  EYES: No blurred or double vision.  EARS, NOSE, AND THROAT: No tinnitus or ear pain.  RESPIRATORY: No cough, shortness of breath, wheezing or hemoptysis.  CARDIOVASCULAR: No chest pain, orthopnea, edema.  GASTROINTESTINAL: Reports nausea, vomiting, denies diarrhea or abdominal pain.  GENITOURINARY: No dysuria, hematuria.  ENDOCRINE: No polyuria, nocturia,  HEMATOLOGY: No anemia, easy bruising or bleeding SKIN: No rash or lesion. MUSCULOSKELETAL: No joint pain or arthritis.   NEUROLOGIC: No tingling, numbness, weakness.  PSYCHIATRY: No anxiety or depression.   ROS  DRUG ALLERGIES:   Allergies  Allergen Reactions  . Doxycycline Other (See Comments)    Reaction: unknown  . Levaquin [Levofloxacin] Other (See Comments)    Causes joint pain  . Clindamycin/Lincomycin Itching    Petechia  . Penicillins Rash and Other (See Comments)    Has patient had a PCN reaction causing immediate rash, facial/tongue/throat swelling, SOB or lightheadedness with hypotension: Yes Has patient had a PCN reaction causing severe rash involving mucus membranes or skin necrosis: No Has patient had a PCN reaction that required hospitalization No Has patient had a PCN reaction occurring within the last 10 years: No If all of the above answers are "NO", then may proceed with Cephalosporin use.  . Sulfa Antibiotics Swelling and Rash    VITALS:  Blood pressure 136/71, pulse 96, temperature 98.9 F (37.2 C), temperature source Oral, resp. rate 20, height 5\' 5"  (1.651 m), weight  97.665 kg (215 lb 5 oz), SpO2 96 %.  PHYSICAL EXAMINATION:  GENERAL:  70 y.o.-year-old obase  patient lying in the bed with no acute distress.  EYES: Pupils equal, round, reactive to light and accommodation. No scleral icterus. Extraocular muscles intact.  HEENT: Head atraumatic, normocephalic. Oropharynx and nasopharynx clear.  NECK:  Supple, no jugular venous distention. No thyroid enlargement, no tenderness.  LUNGS: Normal breath sounds bilaterally, no wheezing, rales,rhonchi or crepitation. No use of accessory muscles of respiration.  CARDIOVASCULAR: S1, S2 normal. No murmurs, rubs, or gallops.  ABDOMEN: Soft, nontender, nondistended. Bowel sounds present. No organomegaly or mass.  EXTREMITIES: left leg- dressing present.   Right leg- have a wound vac present. NEUROLOGIC: Cranial nerves II through XII are intact. Muscle strength 5/5 in all extremities. Sensation intact. Gait not checked.  PSYCHIATRIC: The patient is alert and oriented x 3.  SKIN: No obvious rash, lesion, or ulcer.   Physical Exam LABORATORY PANEL:   CBC  Recent Labs Lab 11/06/15 0628  WBC 9.0  HGB 9.4*  HCT 28.1*  PLT 194   ------------------------------------------------------------------------------------------------------------------  Chemistries   Recent Labs Lab 11/06/15 0932  NA 134*  K 3.2*  CL 99*  CO2 25  GLUCOSE 253*  BUN 36*  CREATININE 3.39*  CALCIUM 9.3   ------------------------------------------------------------------------------------------------------------------  Cardiac Enzymes  Recent Labs Lab 11/05/15 1733 11/05/15 1952  TROPONINI 0.19* 0.15*   ------------------------------------------------------------------------------------------------------------------  RADIOLOGY:  Dg Chest Port 1 View  11/05/2015  CLINICAL DATA:  69 year old female with respiratory distress EXAM: PORTABLE CHEST 1 VIEW COMPARISON:  Radiograph dated 05/20/2014 FINDINGS: There  is stable  cardiomegaly. Minimal bibasilar atelectatic changes. No focal consolidation, pleural effusion, or pneumothorax. The osseous structures appear unremarkable. IMPRESSION: No active disease. Electronically Signed   By: Anner Crete M.D.   On: 11/05/2015 06:10    ASSESSMENT AND PLAN:   Principal Problem:   Uncontrolled hypertension Active Problems:   Uncontrolled diabetes mellitus (HCC)   *Acute renal failure secondary to dehydration-from GI losses-prerenal Echo with normal ejection fraction Provide hydration with IV fluids Avoid nephrotoxins Repeat BMP in a.m. and if no improvement consider nephrology consult   *Intractable nausea and vomiting=-probably acute viral syndrome versus peptic ulcer disease Provide hydration with IV fluids If patient persistently vomiting we will keep her nothing by mouth PPI If no improvement will consider GI consult  *Abnormal troponin probably from demand ischemia Cardiac consult is placed to Dr. Neldon Newport shows. Patient is asymptomatic. Echo with normal ejection fraction 60-65%.  *Hypokalemia Replace potassium  * Uncontrolled Htn-blood pressure is better today   Hydralazine inj given.   Now resumed Home meds- and BP stable.  * Uncontrolled DM-Accu-Cheks are better Titrate Lantus as needed   Resume 40 unit night dose of lantus tonight.    * Hypoxia- ac respi failure   Likely due to bronchitis or may have underlying COPD-as she have a long smoking history.   Nebs, Currently no wheezing.    Try to wean to room air.  * Leg ulcer and wound vac   Wound care saw her.   She follows in office - and has plans for skin graftings.   NO signs of infection.   Continue Abx.       All the records are reviewed and case discussed with Care Management/Social Workerr. Management plans discussed with the patient, family and they are in agreement.  CODE STATUS: Full  TOTAL TIME TAKING CARE OF THIS PATIENT: 35 minutes.   Discussed with her daughter  Gwenith Daily over phone -[phone] [phone]  POSSIBLE D/C IN 1-2 DAYS, DEPENDING ON CLINICAL CONDITION.   Nicholes Mango M.D on 11/06/2015   Between 7am to 6pm - Pager - [phone]  After 6pm go to [email or web address] - password [email or web address] [facility]  Office  [phone]  CC: Primary care physician; Glendon Axe, MD  Note: This dictation was prepared with Dragon dictation along with smaller phrase technology. Any transcriptional errors that result from this process are unintentional.

## 2015-11-06 NOTE — Progress Notes (Signed)
Notified Dr. Margaretmary Eddy that pt feels more nauseated and and is unable to hold down po intake, per MD make pt NPO. Telephone

## 2015-11-06 NOTE — Care Management (Signed)
Admitted to [facility] with the diagnosis of uncontrolled hypertension. Lives alone. States that when she is discharged she will be going to daughter's home. Arlene [phone]). Last seen Dr. Glendon Axe 3 months ago. No home health. No skilled facility. No home oxygen. Life Alert present. Takes care of all basic activities of daily living herself, drives. Daughter helps with some errands. Last fall was 8 months ago. Decreased appetite lately. Right leg wound vac. Ms. Schmelter says she has had the wound vac x 1 month. Vac was placed at wound center. Daughter will transport. Shelbie Ammons RN MSN CCM Care Management [phone]

## 2015-11-06 NOTE — Progress Notes (Signed)
Inpatient Diabetes Program Recommendations  AACE/ADA: New Consensus Statement on Inpatient Glycemic Control (2015)  Target Ranges:  Prepandial:   less than 140 mg/dL      Peak postprandial:   less than 180 mg/dL (1-2 hours)      Critically ill patients:  140 - 180 mg/dL   Review of Glycemic Control  Results for [name], [name] (MRN [id number]) as of 11/06/2015 12:29  Ref. Range 11/05/2015 11:23 11/05/2015 16:16 11/05/2015 21:16 11/06/2015 07:16 11/06/2015 11:11  Glucose-Capillary Latest Ref Range: 65-99 mg/dL 424 (H) 348 (H) 354 (H) 293 (H) 178 (H)    Diabetes history: Type 2, A1C 10.4% on 11/05/15 Outpatient Diabetes medications: NPH 20 units qhs, R insulin 12 units tid Current orders for Inpatient glycemic control: Lantus 40 units qhs, Novolog 0-5 units qhs, Novolog 0-15 units tid,   Inpatient Diabetes Program Recommendations: Clarified meds with patient. The patient was ordered NPH 20 units bid by MD but was told that NPH was the same as Lantus; she was only taking the NPH 20 units once a day because that's how she took Lantus (not understanding it is long acting but only for 12 hours, not 24 hours like Lantus) therefore recent A1C of 10.4%.  She was taking R insulin 12 units tid with meals.   Agree with current orders for diabetes medications.  Gentry Fitz, RN, BA, MHA, CDE Diabetes Coordinator Inpatient Diabetes Program  [phone] (Team Pager) [phone] ([place]) 11/06/2015 12:56 PM

## 2015-11-07 ENCOUNTER — Inpatient Hospital Stay: Payer: Commercial Managed Care - HMO

## 2015-11-07 ENCOUNTER — Ambulatory Visit: Payer: PPO | Admitting: Surgery

## 2015-11-07 DIAGNOSIS — I248 Other forms of acute ischemic heart disease: Secondary | ICD-10-CM | POA: Diagnosis present

## 2015-11-07 LAB — PROTEIN / CREATININE RATIO, URINE
Creatinine, Urine: 179 mg/dL
PROTEIN CREATININE RATIO: 0.13 mg/mg{creat} (ref 0.00–0.15)
Total Protein, Urine: 24 mg/dL

## 2015-11-07 LAB — BASIC METABOLIC PANEL
ANION GAP: 11 (ref 5–15)
BUN: 39 mg/dL — ABNORMAL HIGH (ref 6–20)
CALCIUM: 9 mg/dL (ref 8.9–10.3)
CO2: 23 mmol/L (ref 22–32)
Chloride: 100 mmol/L — ABNORMAL LOW (ref 101–111)
Creatinine, Ser: 3.95 mg/dL — ABNORMAL HIGH (ref 0.44–1.00)
GFR, EST AFRICAN AMERICAN: 12 mL/min — AB (ref >60)
GFR, EST NON AFRICAN AMERICAN: 11 mL/min — AB (ref >60)
Glucose, Bld: 162 mg/dL — ABNORMAL HIGH (ref 65–99)
Potassium: 3.5 mmol/L (ref 3.5–5.1)
Sodium: 134 mmol/L — ABNORMAL LOW (ref 135–145)

## 2015-11-07 LAB — CBC
HCT: 27.6 % — ABNORMAL LOW (ref 35.0–47.0)
HEMOGLOBIN: 9.3 g/dL — AB (ref 12.0–16.0)
MCH: 28.7 pg (ref 26.0–34.0)
MCHC: 33.6 g/dL (ref 32.0–36.0)
MCV: 85.6 fL (ref 80.0–100.0)
Platelets: 203 10*3/uL (ref 150–440)
RBC: 3.22 MIL/uL — AB (ref 3.80–5.20)
RDW: 14 % (ref 11.5–14.5)
WBC: 8.9 10*3/uL (ref 3.6–11.0)

## 2015-11-07 LAB — GLUCOSE, CAPILLARY
GLUCOSE-CAPILLARY: 130 mg/dL — AB (ref 65–99)
GLUCOSE-CAPILLARY: 52 mg/dL — AB (ref 65–99)
GLUCOSE-CAPILLARY: 55 mg/dL — AB (ref 65–99)
GLUCOSE-CAPILLARY: 81 mg/dL (ref 65–99)
Glucose-Capillary: 141 mg/dL — ABNORMAL HIGH (ref 65–99)
Glucose-Capillary: 103 mg/dL — ABNORMAL HIGH (ref 65–99)

## 2015-11-07 LAB — MAGNESIUM: MAGNESIUM: 1.5 mg/dL — AB (ref 1.7–2.4)

## 2015-11-07 MED ORDER — SODIUM CHLORIDE 0.9 % IV SOLN
INTRAVENOUS | Status: AC
Start: 2015-11-07 — End: 2015-11-08
  Administered 2015-11-07 – 2015-11-08 (×2): via INTRAVENOUS

## 2015-11-07 MED ORDER — INSULIN GLARGINE 100 UNIT/ML ~~LOC~~ SOLN
25.0000 [IU] | Freq: Every day | SUBCUTANEOUS | Status: DC
  Administered 2015-11-08 – 2015-11-09 (×2): 25 [IU] via SUBCUTANEOUS
  Filled 2015-11-07 (×4): qty 0.25

## 2015-11-07 MED ORDER — POLYETHYLENE GLYCOL 3350 17 G PO PACK
17.0000 g | PACK | Freq: Every day | ORAL | Status: DC
  Administered 2015-11-07 – 2015-11-10 (×4): 17 g via ORAL
  Filled 2015-11-07 (×4): qty 1

## 2015-11-07 MED ORDER — MAGNESIUM SULFATE 2 GM/50ML IV SOLN
2.0000 g | Freq: Once | INTRAVENOUS | Status: AC
  Administered 2015-11-07: 09:00:00 2 g via INTRAVENOUS
  Filled 2015-11-07: qty 50

## 2015-11-07 MED ORDER — METOPROLOL TARTRATE 50 MG PO TABS
50.0000 mg | ORAL_TABLET | Freq: Two times a day (BID) | ORAL | Status: DC
  Administered 2015-11-07 – 2015-11-10 (×7): 50 mg via ORAL
  Filled 2015-11-07 (×7): qty 1

## 2015-11-07 MED ORDER — MORPHINE SULFATE (PF) 2 MG/ML IV SOLN
2.0000 mg | INTRAVENOUS | Status: DC | PRN
  Administered 2015-11-07: 2 mg via INTRAVENOUS
  Filled 2015-11-07: qty 1

## 2015-11-07 MED ORDER — PNEUMOCOCCAL VAC POLYVALENT 25 MCG/0.5ML IJ INJ
0.5000 mL | INJECTION | INTRAMUSCULAR | Status: AC
  Administered 2015-11-10: 08:00:00 0.5 mL via INTRAMUSCULAR
  Filled 2015-11-07: qty 0.5

## 2015-11-07 NOTE — Progress Notes (Signed)
°   11/07/15 1400  Clinical Encounter Type  Visited With Patient;Family;Health care provider  Visit Type Follow-up;Psychological support  Referral From Nurse  Consult/Referral To Chaplain  Spiritual Encounters  Spiritual Needs Other (Comment)  Stress Factors  Patient Stress Factors Not reviewed  Family Stress Factors Not reviewed  Advance Directives (For Healthcare)  Does patient have an advance directive? Yes  Type of Paramed[place] of [place];Living will  Copy of advanced directive(s) in chart? Yes  Chaplain follow-up on AD documents.  AD documents signed, notarized, copied, and placed in chart/given to patient. [place] [phone]

## 2015-11-07 NOTE — Progress Notes (Addendum)
[facility] NAME: [name]    MR#:  [id number]  DATE OF BIRTH:  Feb 26, 1946  SUBJECTIVE:  CHIEF COMPLAINT:   Chief Complaint  Patient presents with  . Shortness of Breath    On oxygen, but now does not have any wheezing.   Patient's nausea and vomiting improved. Denies any abdominal pain or diarrhea. Feeling hungry today. Denies any chest pain  REVIEW OF SYSTEMS:  CONSTITUTIONAL: No fever, fatigue or weakness.  EYES: No blurred or double vision.  EARS, NOSE, AND THROAT: No tinnitus or ear pain.  RESPIRATORY: No cough, shortness of breath, wheezing or hemoptysis.  CARDIOVASCULAR: No chest pain, orthopnea, edema.  GASTROINTESTINAL: Denies nausea, vomiting, denies diarrhea or abdominal pain.  GENITOURINARY: No dysuria, hematuria.  ENDOCRINE: No polyuria, nocturia,  HEMATOLOGY: No anemia, easy bruising or bleeding SKIN: No rash or lesion. MUSCULOSKELETAL: No joint pain or arthritis.   NEUROLOGIC: No tingling, numbness, weakness.  PSYCHIATRY: No anxiety or depression.   ROS  DRUG ALLERGIES:   Allergies  Allergen Reactions  . Doxycycline Other (See Comments)    Reaction: unknown  . Levaquin [Levofloxacin] Other (See Comments)    Causes joint pain  . Clindamycin/Lincomycin Itching    Petechia  . Penicillins Rash and Other (See Comments)    Has patient had a PCN reaction causing immediate rash, facial/tongue/throat swelling, SOB or lightheadedness with hypotension: Yes Has patient had a PCN reaction causing severe rash involving mucus membranes or skin necrosis: No Has patient had a PCN reaction that required hospitalization No Has patient had a PCN reaction occurring within the last 10 years: No If all of the above answers are "NO", then may proceed with Cephalosporin use.  . Sulfa Antibiotics Swelling and Rash    VITALS:  Blood pressure 128/56, pulse 92, temperature 99.1 F (37.3 C), temperature source Oral,  resp. rate 18, height 5\' 5"  (1.651 m), weight 97.665 kg (215 lb 5 oz), SpO2 97 %.  PHYSICAL EXAMINATION:  GENERAL:  70 y.o.-year-old obase  patient lying in the bed with no acute distress.  EYES: Pupils equal, round, reactive to light and accommodation. No scleral icterus. Extraocular muscles intact.  HEENT: Head atraumatic, normocephalic. Oropharynx and nasopharynx clear.  NECK:  Supple, no jugular venous distention. No thyroid enlargement, no tenderness.  LUNGS: Normal breath sounds bilaterally, no wheezing, rales,rhonchi or crepitation. No use of accessory muscles of respiration.  CARDIOVASCULAR: S1, S2 normal. No murmurs, rubs, or gallops.  ABDOMEN: Soft, nontender, nondistended. Bowel sounds present. No organomegaly or mass.  EXTREMITIES: left leg- dressing present.   Right leg- have a wound vac present. NEUROLOGIC: Cranial nerves II through XII are intact. Muscle strength 5/5 in all extremities. Sensation intact. Gait not checked.  PSYCHIATRIC: The patient is alert and oriented x 3.  SKIN: No obvious rash, lesion, or ulcer.   Physical Exam LABORATORY PANEL:   CBC  Recent Labs Lab 11/07/15 0430  WBC 8.9  HGB 9.3*  HCT 27.6*  PLT 203   ------------------------------------------------------------------------------------------------------------------  Chemistries   Recent Labs Lab 11/07/15 0430  NA 134*  K 3.5  CL 100*  CO2 23  GLUCOSE 162*  BUN 39*  CREATININE 3.95*  CALCIUM 9.0  MG 1.5*   ------------------------------------------------------------------------------------------------------------------  Cardiac Enzymes  Recent Labs Lab 11/05/15 1733 11/05/15 1952  TROPONINI 0.19* 0.15*   ------------------------------------------------------------------------------------------------------------------  RADIOLOGY:  US Renal  11/07/2015  CLINICAL DATA:  Acute kidney injury EXAM: RENAL / URINARY TRACT ULTRASOUND COMPLETE  COMPARISON:  10/21/2006 FINDINGS:  Right Kidney: Length: 10.5 cm. 15 mm cyst in the upper pole. No hydronephrosis or evidence of solid mass. Present hilar Doppler signal. Left Kidney: Length: 10 cm. Rounded appearance of the anterior interpolar kidney has a stable appearance since 2007 (see transverse imaging at that time). Better demonstrated on 06/07/2014 scan there is corticomedullary differentiation and nondisplaced internal vessels at this level compatible with prominent parenchyma. No hydronephrosis. No suspected mass. Bladder: Appears normal for degree of bladder distention. IMPRESSION: No hydronephrosis.  Stable since 2007. Electronically Signed   By: Monte Fantasia M.D.   On: 11/07/2015 11:01    ASSESSMENT AND PLAN:   Principal Problem:   Uncontrolled hypertension Active Problems:   Uncontrolled diabetes mellitus ([place])   Demand ischemia (HCC)   *Acute renal failure secondary to dehydration-from GI losses-prerenal--> ATN Echo with normal ejection fraction Provide hydration with IV fluids Avoid nephrotoxins Renal ultrasound is normal with no hydronephrosis Repeat BMP with worsening creatinine-1.9-3.39-3.95 Nephrology consult is placed to Dr. Candiss Norse   *Intractable nausea and vomiting=-probably acute viral syndrome  Improving clinically Start clear liquid diet and advance as tolerated PPI If no improvement will consider GI consult  *Hypomagnesemia Magnesium sulfate 2 g IV today and check magnesium in a.m.  *Abnormal troponin probably from demand ischemia Appreciate cardiology recommendations, no interventions needed at this time Echo with normal ejection fraction 60-65%.  *Hypokalemia Replace potassium  * Uncontrolled Htn-blood pressure is better today   Hydralazine as needed    Now resumed Home meds- and BP stable.  * Uncontrolled DM-Accu-Cheks are better Titrate Lantus as needed   Resume 40 unit night dose of lantus tonight.    * Hypoxia- ac respi failure   Likely due to bronchitis or may have  underlying COPD-as she have a long smoking history.   Nebs, Currently no wheezing.    Try to wean to room air.  * Leg ulcer and wound vac   continue wound care and outpatient follow-up with wound care clinic    NO signs of infection.   Continue Abx.       All the records are reviewed and case discussed with Care Management/Social Workerr. Management plans discussed with the patient, family and they are in agreement.  CODE STATUS: Full  TOTAL TIME TAKING CARE OF THIS PATIENT: 35 minutes.   Discussed with her daughter Gwenith Daily over phone [phone]  POSSIBLE D/C IN 1-2 DAYS, DEPENDING ON CLINICAL CONDITION.   Nicholes Mango M.D on 11/07/2015   Between 7am to 6pm - Pager - [phone]  After 6pm go to [email or web address] - password [email or web address] [facility]  Office  [phone]  CC: Primary care physician; Glendon Axe, MD  Note: This dictation was prepared with Dragon dictation along with smaller phrase technology. Any transcriptional errors that result from this process are unintentional.

## 2015-11-07 NOTE — Consult Note (Signed)
[name]  CARDIOLOGY CONSULT NOTE  Patient ID: [name] MRN: [id number] DOB/AGE: 02/10/1946 70 y.o.  Admit date: 11/05/2015 Referring Physician Dr. Margaretmary Eddy Primary Physician   Primary Cardiologist Dr. Saralyn Pilar Reason for Consultation hypertension/abnormal troponin  HPI:  Pt is a 70 yo female with history of insignificant cad with a 50% lad and 75% ri which was treated medically, history of hypertension, hyperlipidemia and dm who was admitted with sob and fatigue and was noted to be profoundly hypertensive and hyperglycemia. Initial ekg showed sinus tachycardia with no ischemia. She had a mild troponin elevation to 0.19. Echo revealed normal lv funciton with ef of 65% with lvh. She improved with treatment of her hypertension and her hyperglycemia. She has no further chest pain. She reported compliance with her meds. She has developed acute on chronic renal insuffiency with serum creatinine of3.95 up from 1.93 2 days ago.   ROS Review of Systems - History obtained from chart review and the patient General ROS: positive for  - fatigue Breast ROS: negative Respiratory ROS: positive for - shortness of breath Cardiovascular ROS: positive for - dyspnea on exertion Gastrointestinal ROS: no abdominal pain, change in bowel habits, or black or bloody stools Genito-Urinary ROS: no dysuria, trouble voiding, or hematuria Musculoskeletal ROS: negative Neurological ROS: no TIA or stroke symptoms   Past Medical History  Diagnosis Date  . Diabetes mellitus without complication ([place])     Type II  . Ulcer of left lower leg [facility]) February 07, 2015  . Chronic renal insufficiency February 07, 2015  . Coronary artery dilation ([place])   . Hypertension   . Hyperlipidemia   . Arthritis     Inflammatory  . Thyroid disease   . Sinus problem     Family History  Problem Relation Age of Onset  . Diabetes Mellitus II Mother   . Diabetes Mellitus II Father     Social  History   Social History  . Marital Status: Married    Spouse Name: N/A  . Number of Children: N/A  . Years of Education: N/A   Occupational History  . retired    Social History Main Topics  . Smoking status: Former Smoker -- 88 years  . Smokeless tobacco: Never Used  . Alcohol Use: No  . Drug Use: No  . Sexual Activity: Not on file   Other Topics Concern  . Not on file   Social History Narrative    Past Surgical History  Procedure Laterality Date  . Abdominal hysterectomy    . Dilation and curettage of uterus  1984  . Partial hysterectomy  1985  . Rotator cuff repair  2004  . Cyst excision    . Cataract extraction w/phaco Right 03/11/2015    Procedure: CATARACT EXTRACTION PHACO AND INTRAOCULAR LENS PLACEMENT (IOC);  Surgeon: Birder Robson, MD;  Location: [facility] ORS;  Service: Ophthalmology;  Laterality: Right;  [place] 00:40 AP% 24.3 CDE 9.86  . I&d extremity Right 09/04/2015    Procedure: IRRIGATION AND DEBRIDEMENT EXTREMITY/ AND BIOPSY;  Surgeon: Algernon Huxley, MD;  Location: [facility] ORS;  Service: Vascular;  Laterality: Right;     Prescriptions prior to admission  Medication Sig Dispense Refill Last Dose  . acetaminophen (TYLENOL) 500 MG tablet Take 500 mg by mouth every 6 (six) hours as needed for mild pain, fever or headache.    prn at prn  . amLODipine (NORVASC) 5 MG tablet Take 5 mg by mouth daily.  11/04/2015 at Unknown time  . aspirin 81 MG tablet Take 81 mg by mouth 2 (two) times daily.    11/04/2015 at Unknown time  . atorvastatin (LIPITOR) 40 MG tablet Take 40 mg by mouth daily.   11/04/2015 at Unknown time  . azaTHIOprine (IMURAN) 50 MG tablet Take 50 mg by mouth daily.   11/04/2015 at Unknown time  . calcium-vitamin D (OSCAL WITH D) 250-125 MG-UNIT tablet Take 1 tablet by mouth daily.   11/04/2015 at Unknown time  . cholecalciferol (VITAMIN D) 1000 units tablet Take 1,000 Units by mouth daily.   11/04/2015 at Unknown time  . collagenase (SANTYL) ointment Apply 1  application topically daily.   11/04/2015 at Unknown time  . furosemide (LASIX) 20 MG tablet Take 20 mg by mouth daily.    11/04/2015 at Unknown time  . hydrochlorothiazide (HYDRODIURIL) 25 MG tablet Take 25 mg by mouth daily.   11/04/2015 at Unknown time  . HYDROcodone-acetaminophen (NORCO) 5-325 MG tablet Take 1 tablet by mouth every 6 (six) hours as needed for moderate pain. (Patient taking differently: Take 0.5 tablets by mouth every 8 (eight) hours as needed for moderate pain. ) 30 tablet 0 prn at prn  . insulin NPH Human (HUMULIN N,NOVOLIN N) 100 UNIT/ML injection Inject 20 Units into the skin at bedtime.   11/04/2015 at Unknown time  . insulin regular (NOVOLIN R,HUMULIN R) 100 units/mL injection Inject 12 Units into the skin 3 (three) times daily before meals.   11/04/2015 at Unknown time  . levocetirizine (XYZAL) 5 MG tablet Take 5 mg by mouth every evening.   11/04/2015 at Unknown time  . lisinopril (PRINIVIL,ZESTRIL) 20 MG tablet Take 20 mg by mouth daily.   11/04/2015 at Unknown time  . loratadine (CLARITIN) 10 MG tablet Take 10 mg by mouth daily.   11/04/2015 at Unknown time  . magnesium oxide (MAG-OX) 400 MG tablet Take 400 mg by mouth 2 (two) times daily.   11/04/2015 at Unknown time  . metoprolol (LOPRESSOR) 50 MG tablet Take 50 mg by mouth 2 (two) times daily.   11/04/2015 at Unknown time  . omeprazole (PRILOSEC) 20 MG capsule Take 20 mg by mouth daily.   11/04/2015 at Unknown time  . pioglitazone (ACTOS) 15 MG tablet Take 15 mg by mouth daily.   11/04/2015 at Unknown time  . potassium chloride (K-DUR) 10 MEQ tablet Take 10 mEq by mouth daily.   11/04/2015 at Unknown time  . predniSONE (DELTASONE) 10 MG tablet Take 35 mg by mouth daily with breakfast.   11/04/2015 at Unknown time  . cephALEXin (KEFLEX) 500 MG capsule Take 1 capsule (500 mg total) by mouth 4 (four) times daily. (Patient not taking: Reported on 09/04/2015) 28 capsule 0 Completed Course at Unknown time  . famotidine (PEPCID) 20 MG  tablet Take 1 tablet (20 mg total) by mouth 2 (two) times daily. (Patient not taking: Reported on 09/04/2015) 8 tablet 0 Not Taking at Unknown time    Physical Exam: Blood pressure 143/52, pulse 111, temperature 99.1 F (37.3 C), temperature source Oral, resp. rate 18, height 5\' 5"  (1.651 m), weight 97.665 kg (215 lb 5 oz), SpO2 95 %.   General appearance: alert and cooperative Resp: clear to auscultation bilaterally Chest wall: no tenderness Cardio: regular rate and rhythm GI: soft, non-tender; bowel sounds normal; no masses,  no organomegaly Extremities: extremities normal, atraumatic, no cyanosis or edema Neurologic: Grossly normal Labs:   Lab Results  Component Value Date   WBC  8.9 11/07/2015   HGB 9.3* 11/07/2015   HCT 27.6* 11/07/2015   MCV 85.6 11/07/2015   PLT 203 11/07/2015    Recent Labs Lab 11/07/15 0430  NA 134*  K 3.5  CL 100*  CO2 23  BUN 39*  CREATININE 3.95*  CALCIUM 9.0  GLUCOSE 162*   Lab Results  Component Value Date   CKTOTAL 208* 02/15/2014   CKMB 1.2 02/15/2014   TROPONINI 0.15* 11/05/2015      Radiology: No active disease EKG: sinus tachycardia with no ischemic changes  ASSESSMENT AND PLAN:  Pt is a 70 yo female with history of cad treated medically, history of hypertension and dm who was admitted with weakness and fatigue and was noted to have significant hypertension and hyper glycemia. She improved with aggressive treatement of her hypertension and hyperglycemia. She subsequently has ceveloped acute on chronic renal insuffiency wwith creatinine up to 3.9. She had a mild serum troponin elevation to 0.19 which appears to be demand ischemia in face of renal insuffiency and hypertensive crisis. Not a nstemi. Not candidate for invasive evaluation due to renal insuffiency and gfr of 11. Conitnue to treat her blood pressure as you are doing and agree with nephrology evaluation. Further recs pending course.  Signed: Teodoro Spray MD,  [facility] 11/07/2015, 12:50 PM

## 2015-11-07 NOTE — Progress Notes (Signed)
Subjective:  Patient is known to our practice from outpatient She was last seen in 05/2014 by Dr Juleen China but then was lost to follow up She presents for complaints of nausea at home and decreased appetite.  She also complains of cough No fevers but she did have chills prior to presentation Baseline creatinine appears to be 1.8/GFR of 34 in December 2016, hemoglobin A1c 10.7% She does have some lower extremity edema   Objective:  Vital signs in last 24 hours:  Temp:  [98.1 F (36.7 C)-99.1 F (37.3 C)] 99.1 F (37.3 C) (01/13 0527) Pulse Rate:  [96-111] 111 (01/13 0527) Resp:  [18-20] 18 (01/13 0527) BP: (136-143)/(52-77) 143/52 mmHg (01/13 0527) SpO2:  [94 %-99 %] 95 % (01/13 0527)  Weight change:  Filed Weights   11/05/15 1049  Weight: 97.665 kg (215 lb 5 oz)    Intake/Output:    Intake/Output Summary (Last 24 hours) at 11/07/15 1044 Last data filed at 11/06/15 1120  Gross per 24 hour  Intake      0 ml  Output     60 ml  Net    -60 ml     Physical Exam: General: No acute distress, sitting up in the chair  HEENT Moist oral mucous membranes  Neck supple  Pulm/lungs Normal respiratory effort, clear to auscultation  CVS/Heart Regular rhythm, no rub or gallop  Abdomen:  Soft, nontender  Extremities: 2+ pitting edema, right leg wound VAC in place  Neurologic: Alert, oriented  Skin: No acute rashes  Access:        Basic Metabolic Panel:   Recent Labs Lab 11/05/15 0512 11/06/15 0932 11/07/15 0430  NA 134* 134* 134*  K 4.1 3.2* 3.5  CL 97* 99* 100*  CO2 25 25 23   GLUCOSE 548* 253* 162*  BUN 28* 36* 39*  CREATININE 1.93* 3.39* 3.95*  CALCIUM 9.5 9.3 9.0  MG  --   --  1.5*     CBC:  Recent Labs Lab 11/05/15 0512 11/06/15 0628 11/07/15 0430  WBC 13.1* 9.0 8.9  HGB 11.0* 9.4* 9.3*  HCT 33.7* 28.1* 27.6*  MCV 86.7 85.6 85.6  PLT 199 194 203      Microbiology:  No results found for this or any previous visit (from the past 720  hour(s)).  Coagulation Studies: No results for input(s): LABPROT, INR in the last 72 hours.  Urinalysis:  Recent Labs  11/05/15 0534  COLORURINE COLORLESS*  LABSPEC 1.006  PHURINE 5.0  GLUCOSEU >500*  HGBUR 2+*  BILIRUBINUR NEGATIVE  KETONESUR NEGATIVE  PROTEINUR NEGATIVE  NITRITE NEGATIVE  LEUKOCYTESUR NEGATIVE      Imaging: No results found.   Medications:   . sodium chloride 100 mL/hr at 11/07/15 0422   . amLODipine  5 mg Oral Daily  . aspirin EC  81 mg Oral Daily  . atorvastatin  40 mg Oral Daily  . azaTHIOprine  50 mg Oral Daily  . bisacodyl  10 mg Rectal Daily  . calcium-vitamin D  0.5 tablet Oral Daily  . cholecalciferol  1,000 Units Oral Daily  . collagenase  1 application Topical Daily  . furosemide  40 mg Oral BID  . heparin subcutaneous  5,000 Units Subcutaneous Q12H  . hydrALAZINE  25 mg Oral 3 times per day  . insulin aspart  0-15 Units Subcutaneous TID WC  . insulin aspart  0-5 Units Subcutaneous QHS  . insulin glargine  40 Units Subcutaneous QHS  . magnesium oxide  400 mg Oral  Daily  . magnesium sulfate 1 - 4 g bolus IVPB  2 g Intravenous Once  . nitroGLYCERIN  0.5 inch Topical 4 times per day  . pantoprazole  40 mg Oral Daily  . potassium chloride  10 mEq Oral Daily  . potassium chloride  40 mEq Oral Once  . potassium chloride  40 mEq Oral Once  . predniSONE  35 mg Oral Q breakfast  . sodium chloride  1,000 mL Intravenous Once  . sodium chloride  3 mL Intravenous Q12H   sodium chloride, acetaminophen, albuterol, hydrALAZINE, morphine injection, ondansetron (ZOFRAN) IV, oxyCODONE-acetaminophen, promethazine, sodium chloride  Assessment/ Plan:  70 y.o. African American female  With history of hypertension, type 2 diabetes with nephropathy, hyperlipidemia, hypothyroidism, coronary disease, hysterectomy, temporal arthritis requiring high-dose steroids in 2016, inflammatory arthritis, allergic rhinitis  1.  Acute renal failure on chronic kidney  disease stage III Baseline creatinine is 1.8/GFR of 34 from December 2016.  Chronic kidney disease seems to be secondary to diabetic nephropathy Acute renal failure is likely related to volume depletion possibly causing ATN Today's creatinine has worsened to 3.95. Renal ultrasound is negative for hydronephrosis. Electrolytes and volume status is acceptable No acute indication for dialysis We will obtain urine studies  2.  Peripheral edema Follow low salt diet     LOS: 2 Terriana Barreras 1/13/201710:44 AM

## 2015-11-07 NOTE — Care Management Important Message (Signed)
Important Message  Patient Details  Name: [name] MRN: [id number] Date of Birth: [birth year]   Medicare Important Message Given:  Yes    Shelbie Ammons, RN 11/07/2015, 10:38 AM

## 2015-11-07 NOTE — Progress Notes (Signed)
Inpatient Diabetes Program Recommendations  AACE/ADA: New Consensus Statement on Inpatient Glycemic Control (2015)  Target Ranges:  Prepandial:   less than 140 mg/dL      Peak postprandial:   less than 180 mg/dL (1-2 hours)      Critically ill patients:  140 - 180 mg/dL   Review of Glycemic Control  Results for [name], [name] (MRN [id number]) as of 11/07/2015 08:08  Ref. Range 11/06/2015 07:16 11/06/2015 11:11 11/06/2015 16:16 11/06/2015 22:03 11/07/2015 07:25  Glucose-Capillary Latest Ref Range: 65-99 mg/dL 293 (H) 178 (H) 239 (H) 220 (H) 130 (H)    Diabetes history: Type 2, A1C 10.4% on 11/05/15 Outpatient Diabetes medications: NPH 20 units qhs, R insulin 12 units tid Current orders for Inpatient glycemic control: Lantus 40 units qhs, Novolog 0-5 units qhs, Novolog 0-15 units tid,   Inpatient Diabetes Program Recommendations:Noted patient is NPO but she will still require Novolog correction (sliding scale) insulin today.  Once she is tolerating food, consider adding a dose of Novolog mealtime insulin (note blood sugars were elevated yesterday on correction insulin (sliding scale) only. Consider Novolog 3 units tid at that time.   Gentry Fitz, RN, BA, MHA, CDE Diabetes Coordinator Inpatient Diabetes Program  [phone] (Team Pager) [phone] ([place]) 11/07/2015 8:11 AM

## 2015-11-07 NOTE — Evaluation (Addendum)
Physical Therapy Evaluation Patient Details Name: [name] MRN: [id number] DOB: [birth year] Today's Date: 11/07/2015   History of Present Illness  Patient is a pleasant 70 y/o female that presents with hypertensive & hyperglycemic episodes. She is ntoed to have elevated troponins, per cardio this is in the setting of renal disease and likely demand ischemia. She has had a wound vac for RLE for roughly 1 month now.   Clinical Impression  Patient has recently been ambulating with a SPC and has had wound vac in place for roughly 1 month. She reports no falls recently and will be living with her grand-daughter to provide living support. She is able to ambulate short household distance with RW and no balance concerns in this session, though gait speed is noted to be reduced from her baseline. Patient educated to use RW and transition to [facility] as she finds comfortable, though no formal PT deficits identified in this session. Skilled PT services are indicated while she is admitted acutely to maintain her ambulation tolerance and strength.     Follow Up Recommendations No PT follow upNone at this time. Outpatient PT would be appropriate if she has higher level balance deficits she would like to address.     Equipment Recommendations  Rolling walker with 5" wheels    Recommendations for Other Services       Precautions / Restrictions Restrictions Weight Bearing Restrictions: No      Mobility  Bed Mobility               General bed mobility comments: Patient received in recliner.   Transfers Overall transfer level: Modified independent Equipment used: Rolling walker (2 wheeled)             General transfer comment: No deficits in transfer noted, appropriate speed and hand placement.   Ambulation/Gait Ambulation/Gait assistance: Modified independent (Device/Increase time) Ambulation Distance (Feet): 50 Feet Assistive device: Rolling walker (2 wheeled) Gait  Pattern/deviations: WFL(Within Functional Limits)   Gait velocity interpretation: Below normal speed for age/gender General Gait Details: No gait deficits identified aside from very mild lateral sway with RW. Decreased gait speed noted as well, per patient she feels weaker than baselien.   Stairs            Wheelchair Mobility    Modified Rankin (Stroke Patients Only)       Balance Overall balance assessment: No apparent balance deficits (not formally assessed)                                           Pertinent Vitals/Pain Pain Assessment: No/denies pain    Home Living Family/patient expects to be discharged to:: Private residence Living Arrangements: Alone (Will be living with grand-daughter.) Available Help at Discharge: Family Type of Home: House Home Access: Level entry     Home Layout: One level Home Equipment: Environmental consultant - 2 wheels;Cane - single point      Prior Function Level of Independence: Independent with assistive device(s)         Comments: Patient has most recently been ambulating with a SPC.     Hand Dominance        Extremity/Trunk Assessment   Upper Extremity Assessment: Overall WFL for tasks assessed           Lower Extremity Assessment: Overall WFL for tasks assessed         Communication  Communication: No difficulties  Cognition Arousal/Alertness: Awake/alert Behavior During Therapy: WFL for tasks assessed/performed Overall Cognitive Status: Within Functional Limits for tasks assessed                      General Comments General comments (skin integrity, edema, etc.): Wound vac in place on RLE, Unna dressing on LLE.     Exercises        Assessment/Plan    PT Assessment Patient needs continued PT services  PT Diagnosis Difficulty walking   PT Problem List Decreased strength;Decreased balance  PT Treatment Interventions DME instruction;Gait training;Stair training;Therapeutic  activities;Therapeutic exercise;Balance training   PT Goals (Current goals can be found in the Care Plan section) Acute Rehab PT Goals Patient Stated Goal: To return to home mobility.  PT Goal Formulation: With patient Time For Goal Achievement: 11/21/15 Potential to Achieve Goals: Good    Frequency Min 2X/week   Barriers to discharge        Co-evaluation               End of Session Equipment Utilized During Treatment: Gait belt Activity Tolerance: Patient tolerated treatment well Patient left: in chair;with family/visitor present (Patient with no alarm in place on PT entering. ) Nurse Communication: Mobility status         Time: [id number] PT Time Calculation (min) (ACUTE ONLY): 12 min   Charges:   PT Evaluation $PT Eval Moderate Complexity: 1 Procedure     PT G Codes:       Kerman Passey, PT, DPT    11/07/2015, 6:58 PM

## 2015-11-07 NOTE — Plan of Care (Signed)
Problem: Safety: Goal: Ability to remain free from injury will improve Outcome: Progressing Pt remains free from injury.  Fall precautions in place.  Family at bedside continuously.  Pt calls with needs.  Wound vac in place with no output.  BM this shift after suppository.

## 2015-11-08 LAB — BASIC METABOLIC PANEL
Anion gap: 9 (ref 5–15)
BUN: 36 mg/dL — AB (ref 6–20)
CHLORIDE: 106 mmol/L (ref 101–111)
CO2: 24 mmol/L (ref 22–32)
CREATININE: 2.87 mg/dL — AB (ref 0.44–1.00)
Calcium: 9.4 mg/dL (ref 8.9–10.3)
GFR calc Af Amer: 18 mL/min — ABNORMAL LOW (ref >60)
GFR calc non Af Amer: 16 mL/min — ABNORMAL LOW (ref >60)
GLUCOSE: 79 mg/dL (ref 65–99)
Potassium: 4.2 mmol/L (ref 3.5–5.1)
SODIUM: 139 mmol/L (ref 135–145)

## 2015-11-08 LAB — GLUCOSE, CAPILLARY
GLUCOSE-CAPILLARY: 86 mg/dL (ref 65–99)
Glucose-Capillary: 115 mg/dL — ABNORMAL HIGH (ref 65–99)
Glucose-Capillary: 157 mg/dL — ABNORMAL HIGH (ref 65–99)
Glucose-Capillary: 112 mg/dL — ABNORMAL HIGH (ref 65–99)

## 2015-11-08 LAB — MAGNESIUM: MAGNESIUM: 2 mg/dL (ref 1.7–2.4)

## 2015-11-08 NOTE — Plan of Care (Signed)
Problem: Safety: Goal: Ability to remain free from injury will improve Outcome: Progressing Patient is a high fall risk.  High fall risk protocol in place.  Patient up to bathroom this shift and steady on feet with stand-by assist.  Patient compliant with calling for needs.  Wound vac remains in place, no output this shift.  Patient lantus was not given this shift per Dr. Lavetta Nielsen as patient blood sugar reading was 104 and she had been in the 50's during the day.

## 2015-11-08 NOTE — Progress Notes (Addendum)
[place] at [place] NAME: [name]    MR#:  [id number]  DATE OF BIRTH:  23-Jan-1946  SUBJECTIVE:  CHIEF COMPLAINT:   Chief Complaint  Patient presents with  . Shortness of Breath      Patient's nausea and vomiting improved. Denies any abdominal pain or diarrhea. Tolerating liquid diet , no new complaints  REVIEW OF SYSTEMS:  CONSTITUTIONAL: No fever, fatigue or weakness.  EYES: No blurred or double vision.  EARS, NOSE, AND THROAT: No tinnitus or ear pain.  RESPIRATORY: No cough, shortness of breath, wheezing or hemoptysis.  CARDIOVASCULAR: No chest pain, orthopnea, edema.  GASTROINTESTINAL: Denies nausea, vomiting, denies diarrhea or abdominal pain.  GENITOURINARY: No dysuria, hematuria.  ENDOCRINE: No polyuria, nocturia,  HEMATOLOGY: No anemia, easy bruising or bleeding SKIN: No rash or lesion. MUSCULOSKELETAL: No joint pain or arthritis.   NEUROLOGIC: No tingling, numbness, weakness.  PSYCHIATRY: No anxiety or depression.   ROS  DRUG ALLERGIES:   Allergies  Allergen Reactions  . Doxycycline Other (See Comments)    Reaction: unknown  . Levaquin [Levofloxacin] Other (See Comments)    Causes joint pain  . Clindamycin/Lincomycin Itching    Petechia  . Penicillins Rash and Other (See Comments)    Has patient had a PCN reaction causing immediate rash, facial/tongue/throat swelling, SOB or lightheadedness with hypotension: Yes Has patient had a PCN reaction causing severe rash involving mucus membranes or skin necrosis: No Has patient had a PCN reaction that required hospitalization No Has patient had a PCN reaction occurring within the last 10 years: No If all of the above answers are "NO", then may proceed with Cephalosporin use.  . Sulfa Antibiotics Swelling and Rash    VITALS:  Blood pressure 118/57, pulse 95, temperature 99.3 F (37.4 C), temperature source Oral, resp. rate 18, height 5\' 5"  (1.651 m), weight  97.665 kg (215 lb 5 oz), SpO2 99 %.  PHYSICAL EXAMINATION:  GENERAL:  70 y.o.-year-old obase  patient lying in the bed with no acute distress.  EYES: Pupils equal, round, reactive to light and accommodation. No scleral icterus. Extraocular muscles intact.  HEENT: Head atraumatic, normocephalic. Oropharynx and nasopharynx clear.  NECK:  Supple, no jugular venous distention. No thyroid enlargement, no tenderness.  LUNGS: Normal breath sounds bilaterally, no wheezing, rales,rhonchi or crepitation. No use of accessory muscles of respiration.  CARDIOVASCULAR: S1, S2 normal. No murmurs, rubs, or gallops.  ABDOMEN: Soft, nontender, nondistended. Bowel sounds present. No organomegaly or mass.  EXTREMITIES: left leg- dressing present.   Right leg- have a wound vac present. NEUROLOGIC: Cranial nerves II through XII are intact. Muscle strength 5/5 in all extremities. Sensation intact. Gait not checked.  PSYCHIATRIC: The patient is alert and oriented x 3.  SKIN: No obvious rash, lesion, or ulcer.   Physical Exam LABORATORY PANEL:   CBC  Recent Labs Lab 11/07/15 0430  WBC 8.9  HGB 9.3*  HCT 27.6*  PLT 203   ------------------------------------------------------------------------------------------------------------------  Chemistries   Recent Labs Lab 11/08/15 0453  NA 139  K 4.2  CL 106  CO2 24  GLUCOSE 79  BUN 36*  CREATININE 2.87*  CALCIUM 9.4  MG 2.0   ------------------------------------------------------------------------------------------------------------------  Cardiac Enzymes  Recent Labs Lab 11/05/15 1733 11/05/15 1952  TROPONINI 0.19* 0.15*   ------------------------------------------------------------------------------------------------------------------  RADIOLOGY:  US Renal  11/07/2015  CLINICAL DATA:  Acute kidney injury EXAM: RENAL / URINARY TRACT ULTRASOUND COMPLETE COMPARISON:  10/21/2006 FINDINGS: Right Kidney: Length: 10.5 cm.  15 mm cyst in the upper  pole. No hydronephrosis or evidence of solid mass. Present hilar Doppler signal. Left Kidney: Length: 10 cm. Rounded appearance of the anterior interpolar kidney has a stable appearance since 2007 (see transverse imaging at that time). Better demonstrated on 06/07/2014 scan there is corticomedullary differentiation and nondisplaced internal vessels at this level compatible with prominent parenchyma. No hydronephrosis. No suspected mass. Bladder: Appears normal for degree of bladder distention. IMPRESSION: No hydronephrosis.  Stable since 2007. Electronically Signed   By: Monte Fantasia M.D.   On: 11/07/2015 11:01    ASSESSMENT AND PLAN:   Principal Problem:   Uncontrolled hypertension Active Problems:   Uncontrolled diabetes mellitus ([place])   Demand ischemia (HCC)   *Acute renal failure secondary to dehydration-from GI losses-prerenal--> ATN Echo with normal ejection fraction Clinically improving wit h hydration Bmp am Avoid nephrotoxins Renal ultrasound is normal with no hydronephrosis Repeat BMP with worsening creatinine- 1.8 --1.9-3.39-3.95-- 2.87 Appreciate Dr. Candiss Norse recommendations, was seen by dr.Kolluru in the past with baseline Cr at 1.8    *Intractable nausea and vomiting=-probably acute viral syndrome  Improving clinically  advance  diet as tolerated PPI   *Hypomagnesemia Magnesium sulfate 2 g IV given  *ch peripheral edema Low salt diet and keep legs elevated  Lasix on hold 2/2 aki  *Abnormal troponin probably from demand ischemia Appreciate cardiology recommendations, no interventions needed at this time Echo with normal ejection fraction 60-65%.  *Hypokalemia Replace potassium  * Uncontrolled Htn-blood pressure is better today   Hydralazine as needed    Now resumed Home meds- and BP stable.  * Uncontrolled DM-Accu-Cheks are better Titrate Lantus as needed   Resume 40 unit night dose of lantus tonight.    * Hypoxia- ac respi failure   Likely due to  bronchitis or may have underlying COPD-as she have a long smoking history.   Nebs, Currently no wheezing.    Try to wean to room air.  * Leg ulcer and wound vac   continue wound care and outpatient follow-up with wound care clinic    NO signs of infection.   Continue Abx.  will change wound vac dressing today  * COnstipation  Laxatives and enema if no improvement        All the records are reviewed and case discussed with Care Management/Social Workerr. Management plans discussed with the patient, family and they are in agreement.  CODE STATUS: Full  TOTAL TIME TAKING CARE OF THIS PATIENT: 35 minutes.   Discussed with her daughter Gwenith Daily over phone [phone] [phone]  POSSIBLE D/C IN 1-2 DAYS, DEPENDING ON CLINICAL CONDITION.   Nicholes Mango M.D on 11/08/2015   Between 7am to 6pm - Pager - [phone]  After 6pm go to [email or web address] - password [email or web address] [facility]  Office  [phone]  CC: Primary care physician; Glendon Axe, MD  Note: This dictation was prepared with Dragon dictation along with smaller phrase technology. Any transcriptional errors that result from this process are unintentional.

## 2015-11-08 NOTE — Progress Notes (Signed)
Subjective:  Patient is known to our practice from outpatient She was last seen in 05/2014 by Dr Juleen China but then was lost to follow up Baseline creatinine appears to be 1.8/GFR of 34 in December 2016, hemoglobin A1c 10.7%  Overall feeling better today Serum creatinine has improved to 2.87    Objective:  Vital signs in last 24 hours:  Temp:  [98.3 F (36.8 C)-99.3 F (37.4 C)] 99.3 F (37.4 C) (01/14 0902) Pulse Rate:  [82-95] 95 (01/14 0902) Resp:  [18-20] 18 (01/14 0902) BP: (110-128)/(46-57) 118/57 mmHg (01/14 0902) SpO2:  [97 %-100 %] 99 % (01/14 0902)  Weight change:  Filed Weights   11/05/15 1049  Weight: 97.665 kg (215 lb 5 oz)    Intake/Output:    Intake/Output Summary (Last 24 hours) at 11/08/15 1131 Last data filed at 11/08/15 0900  Gross per 24 hour  Intake   1910 ml  Output    250 ml  Net   1660 ml     Physical Exam: General: No acute distress, laying in the bed   HEENT Moist oral mucous membranes  Neck supple  Pulm/lungs Normal respiratory effort, clear to auscultation  CVS/Heart Regular rhythm, no rub or gallop  Abdomen:  Soft, nontender  Extremities: 2+ pitting edema, right leg wound VAC in place  Neurologic: Alert, oriented  Skin: No acute rashes  Access:        Basic Metabolic Panel:   Recent Labs Lab 11/05/15 0512 11/06/15 0932 11/07/15 0430 11/08/15 0453  NA 134* 134* 134* 139  K 4.1 3.2* 3.5 4.2  CL 97* 99* 100* 106  CO2 25 25 23 24   GLUCOSE 548* 253* 162* 79  BUN 28* 36* 39* 36*  CREATININE 1.93* 3.39* 3.95* 2.87*  CALCIUM 9.5 9.3 9.0 9.4  MG  --   --  1.5* 2.0     CBC:  Recent Labs Lab 11/05/15 0512 11/06/15 0628 11/07/15 0430  WBC 13.1* 9.0 8.9  HGB 11.0* 9.4* 9.3*  HCT 33.7* 28.1* 27.6*  MCV 86.7 85.6 85.6  PLT 199 194 203      Microbiology:  No results found for this or any previous visit (from the past 720 hour(s)).  Coagulation Studies: No results for input(s): LABPROT, INR in the last 72  hours.  Urinalysis: No results for input(s): COLORURINE, LABSPEC, PHURINE, GLUCOSEU, HGBUR, BILIRUBINUR, KETONESUR, PROTEINUR, UROBILINOGEN, NITRITE, LEUKOCYTESUR in the last 72 hours.  Invalid input(s): APPERANCEUR    Imaging: US Renal  11/07/2015  CLINICAL DATA:  Acute kidney injury EXAM: RENAL / URINARY TRACT ULTRASOUND COMPLETE COMPARISON:  10/21/2006 FINDINGS: Right Kidney: Length: 10.5 cm. 15 mm cyst in the upper pole. No hydronephrosis or evidence of solid mass. Present hilar Doppler signal. Left Kidney: Length: 10 cm. Rounded appearance of the anterior interpolar kidney has a stable appearance since 2007 (see transverse imaging at that time). Better demonstrated on 06/07/2014 scan there is corticomedullary differentiation and nondisplaced internal vessels at this level compatible with prominent parenchyma. No hydronephrosis. No suspected mass. Bladder: Appears normal for degree of bladder distention. IMPRESSION: No hydronephrosis.  Stable since 2007. Electronically Signed   By: Monte Fantasia M.D.   On: 11/07/2015 11:01     Medications:   . sodium chloride 75 mL/hr at 11/08/15 0932   . amLODipine  5 mg Oral Daily  . aspirin EC  81 mg Oral Daily  . atorvastatin  40 mg Oral Daily  . azaTHIOprine  50 mg Oral Daily  . bisacodyl  10  mg Rectal Daily  . calcium-vitamin D  0.5 tablet Oral Daily  . cholecalciferol  1,000 Units Oral Daily  . collagenase  1 application Topical Daily  . heparin subcutaneous  5,000 Units Subcutaneous Q12H  . hydrALAZINE  25 mg Oral 3 times per day  . insulin aspart  0-15 Units Subcutaneous TID WC  . insulin aspart  0-5 Units Subcutaneous QHS  . insulin glargine  25 Units Subcutaneous QHS  . magnesium oxide  400 mg Oral Daily  . metoprolol tartrate  50 mg Oral BID  . pantoprazole  40 mg Oral Daily  . pneumococcal 23 valent vaccine  0.5 mL Intramuscular Tomorrow-1000  . polyethylene glycol  17 g Oral Daily  . potassium chloride  10 mEq Oral Daily  .  potassium chloride  40 mEq Oral Once  . potassium chloride  40 mEq Oral Once  . predniSONE  35 mg Oral Q breakfast  . sodium chloride  1,000 mL Intravenous Once  . sodium chloride  3 mL Intravenous Q12H   sodium chloride, acetaminophen, albuterol, hydrALAZINE, morphine injection, ondansetron (ZOFRAN) IV, oxyCODONE-acetaminophen, promethazine, sodium chloride  Assessment/ Plan:  70 y.o. African American female  With history of hypertension, type 2 diabetes with nephropathy, hyperlipidemia, hypothyroidism, coronary disease, hysterectomy, temporal arthritis requiring high-dose steroids in 2016, inflammatory arthritis, allergic rhinitis  1.  Acute renal failure on chronic kidney disease stage III Baseline creatinine is 1.8/GFR of 34 from December 2016.  Chronic kidney disease seems to be secondary to diabetic nephropathy Acute renal failure is likely related to volume depletion possibly causing ATN Renal ultrasound is negative for hydronephrosis. Serum creatinine has improved from 2.87 Continue IV fluids for now until able to take normal oral intake  2.  Peripheral edema Follow low salt diet as outpatient     LOS: 3 Sylas Twombly 1/14/201711:31 AM

## 2015-11-09 LAB — BASIC METABOLIC PANEL
ANION GAP: 6 (ref 5–15)
BUN: 32 mg/dL — ABNORMAL HIGH (ref 6–20)
CHLORIDE: 106 mmol/L (ref 101–111)
CO2: 25 mmol/L (ref 22–32)
Calcium: 8.9 mg/dL (ref 8.9–10.3)
Creatinine, Ser: 2.36 mg/dL — ABNORMAL HIGH (ref 0.44–1.00)
GFR calc non Af Amer: 20 mL/min — ABNORMAL LOW (ref >60)
GFR, EST AFRICAN AMERICAN: 23 mL/min — AB (ref >60)
Glucose, Bld: 97 mg/dL (ref 65–99)
POTASSIUM: 4.1 mmol/L (ref 3.5–5.1)
SODIUM: 137 mmol/L (ref 135–145)

## 2015-11-09 LAB — GLUCOSE, CAPILLARY
GLUCOSE-CAPILLARY: 98 mg/dL (ref 65–99)
GLUCOSE-CAPILLARY: 134 mg/dL — AB (ref 65–99)
GLUCOSE-CAPILLARY: 155 mg/dL — AB (ref 65–99)
Glucose-Capillary: 161 mg/dL — ABNORMAL HIGH (ref 65–99)

## 2015-11-09 NOTE — Progress Notes (Signed)
[place] at [place] NAME: [name]    MR#:  [id number]  DATE OF BIRTH:  11/23/1945  SUBJECTIVE:  CHIEF COMPLAINT:   Chief Complaint  Patient presents with  . Shortness of Breath      Patient is resting comfortably. No overnight events. No new complaints Tolerating  diet   REVIEW OF SYSTEMS:  CONSTITUTIONAL: No fever, fatigue or weakness.  EYES: No blurred or double vision.  EARS, NOSE, AND THROAT: No tinnitus or ear pain.  RESPIRATORY: No cough, shortness of breath, wheezing or hemoptysis.  CARDIOVASCULAR: No chest pain, orthopnea, edema.  GASTROINTESTINAL: Denies nausea, vomiting, denies diarrhea or abdominal pain.  GENITOURINARY: No dysuria, hematuria.  ENDOCRINE: No polyuria, nocturia,  HEMATOLOGY: No anemia, easy bruising or bleeding SKIN: No rash or lesion. MUSCULOSKELETAL: No joint pain or arthritis.   NEUROLOGIC: No tingling, numbness, weakness.  PSYCHIATRY: No anxiety or depression.   ROS  DRUG ALLERGIES:   Allergies  Allergen Reactions  . Doxycycline Other (See Comments)    Reaction: unknown  . Levaquin [Levofloxacin] Other (See Comments)    Causes joint pain  . Clindamycin/Lincomycin Itching    Petechia  . Penicillins Rash and Other (See Comments)    Has patient had a PCN reaction causing immediate rash, facial/tongue/throat swelling, SOB or lightheadedness with hypotension: Yes Has patient had a PCN reaction causing severe rash involving mucus membranes or skin necrosis: No Has patient had a PCN reaction that required hospitalization No Has patient had a PCN reaction occurring within the last 10 years: No If all of the above answers are "NO", then may proceed with Cephalosporin use.  . Sulfa Antibiotics Swelling and Rash    VITALS:  Blood pressure 128/62, pulse 71, temperature 99 F (37.2 C), temperature source Oral, resp. rate 20, height 5\' 5"  (1.651 m), weight 97.665 kg (215 lb 5 oz), SpO2 99  %.  PHYSICAL EXAMINATION:  GENERAL:  70 y.o.-year-old obase  patient lying in the bed with no acute distress.  EYES: Pupils equal, round, reactive to light and accommodation. No scleral icterus. Extraocular muscles intact.  HEENT: Head atraumatic, normocephalic. Oropharynx and nasopharynx clear.  NECK:  Supple, no jugular venous distention. No thyroid enlargement, no tenderness.  LUNGS: Normal breath sounds bilaterally, no wheezing, rales,rhonchi or crepitation. No use of accessory muscles of respiration.  CARDIOVASCULAR: S1, S2 normal. No murmurs, rubs, or gallops.  ABDOMEN: Soft, nontender, nondistended. Bowel sounds present. No organomegaly or mass.  EXTREMITIES: left leg- dressing present.   Right leg- have a wound vac present. NEUROLOGIC: Cranial nerves II through XII are intact. Muscle strength 5/5 in all extremities. Sensation intact. Gait not checked.  PSYCHIATRIC: The patient is alert and oriented x 3.  SKIN: No obvious rash, lesion, or ulcer.   Physical Exam LABORATORY PANEL:   CBC  Recent Labs Lab 11/07/15 0430  WBC 8.9  HGB 9.3*  HCT 27.6*  PLT 203   ------------------------------------------------------------------------------------------------------------------  Chemistries   Recent Labs Lab 11/08/15 0453 11/09/15 0540  NA 139 137  K 4.2 4.1  CL 106 106  CO2 24 25  GLUCOSE 79 97  BUN 36* 32*  CREATININE 2.87* 2.36*  CALCIUM 9.4 8.9  MG 2.0  --    ------------------------------------------------------------------------------------------------------------------  Cardiac Enzymes  Recent Labs Lab 11/05/15 1733 11/05/15 1952  TROPONINI 0.19* 0.15*   ------------------------------------------------------------------------------------------------------------------  RADIOLOGY:  No results found.  ASSESSMENT AND PLAN:   Principal Problem:   Uncontrolled hypertension Active Problems:  Uncontrolled diabetes mellitus ([place])   Demand ischemia  ([facility])   *Acute renal failure secondary to dehydration-from GI losses-prerenal--> ATN Echo with normal ejection fraction Clinically improving wit h hydration Bmp am Avoid nephrotoxins Renal ultrasound is normal with no hydronephrosis Repeat BMP with worsening creatinine- 1.8 --1.9-3.39-3.95-- 2.87--2.36 Appreciate Dr. Candiss Norse recommendations, was seen by dr.Kolluru in the past with baseline Cr at 1.8    *Intractable nausea and vomiting=-probably acute viral syndrome  Improving clinically  advance  diet as tolerated PPI   *Hypomagnesemia Magnesium sulfate 2 g IV given  *ch peripheral edema Low salt diet and keep legs elevated  Lasix on hold 2/2 aki  *Abnormal troponin probably from demand ischemia Appreciate cardiology recommendations, no interventions needed at this time Echo with normal ejection fraction 60-65%.  *Hypokalemia Replace potassium  * Uncontrolled Htn-blood pressure is better today   Hydralazine as needed    Now resumed Home meds- and BP stable.  * Uncontrolled DM-Accu-Cheks are better Titrate Lantus as needed   Resume 40 unit night dose of lantus tonight.    * Hypoxia- ac respi failure   Likely due to bronchitis or may have underlying COPD-as she have a long smoking history.   Nebs, Currently no wheezing.    Try to wean to room air.  * Leg ulcer and wound vac   continue wound care and outpatient follow-up with wound care clinic    NO signs of infection.   Continue Abx.  will change wound vac dressing today  * COnstipation  Laxatives and enema if no improvement   *Generalized weakness-deconditioning with PT is ordered      All the records are reviewed and case discussed with Care Management/Social Workerr. Management plans discussed with the patient, family and they are in agreement.  CODE STATUS: Full  TOTAL TIME TAKING CARE OF THIS PATIENT: 35 minutes.   Discussed with her daughter Gwenith Daily over phone [phone]  [phone]  POSSIBLE D/C IN 1-2 DAYS, DEPENDING ON CLINICAL CONDITION.   Nicholes Mango M.D on 11/09/2015   Between 7am to 6pm - Pager - [phone]  After 6pm go to [email or web address] - password [email or web address] [facility]  Office  [phone]  CC: Primary care physician; Glendon Axe, MD  Note: This dictation was prepared with Dragon dictation along with smaller phrase technology. Any transcriptional errors that result from this process are unintentional.

## 2015-11-09 NOTE — Progress Notes (Signed)
[place]  SUBJECTIVE: headache and leg pain.   Filed Vitals:   11/08/15 0439 11/08/15 0902 11/08/15 2120 11/09/15 0503  BP: 110/46 118/57 124/54 134/54  Pulse: 93 95 85 81  Temp: 98.8 F (37.1 C) 99.3 F (37.4 C) 98 F (36.7 C) 99 F (37.2 C)  TempSrc: Oral Oral Oral Oral  Resp: 18 18 18 18   Height:      Weight:      SpO2: 100% 99% 99% 99%    Intake/Output Summary (Last 24 hours) at 11/09/15 1025 Last data filed at 11/09/15 0900  Gross per 24 hour  Intake   1940 ml  Output      0 ml  Net   1940 ml    LABS: Basic Metabolic Panel:  Recent Labs  11/07/15 0430 11/08/15 0453 11/09/15 0540  NA 134* 139 137  K 3.5 4.2 4.1  CL 100* 106 106  CO2 23 24 25   GLUCOSE 162* 79 97  BUN 39* 36* 32*  CREATININE 3.95* 2.87* 2.36*  CALCIUM 9.0 9.4 8.9  MG 1.5* 2.0  --    Liver Function Tests: No results for input(s): AST, ALT, ALKPHOS, BILITOT, PROT, ALBUMIN in the last 72 hours. No results for input(s): LIPASE, AMYLASE in the last 72 hours. CBC:  Recent Labs  11/07/15 0430  WBC 8.9  HGB 9.3*  HCT 27.6*  MCV 85.6  PLT 203   Cardiac Enzymes: No results for input(s): CKTOTAL, CKMB, CKMBINDEX, TROPONINI in the last 72 hours. BNP: Invalid input(s): POCBNP D-Dimer: No results for input(s): DDIMER in the last 72 hours. Hemoglobin A1C: No results for input(s): HGBA1C in the last 72 hours. Fasting Lipid Panel: No results for input(s): CHOL, HDL, LDLCALC, TRIG, CHOLHDL, LDLDIRECT in the last 72 hours. Thyroid Function Tests: No results for input(s): TSH, T4TOTAL, T3FREE, THYROIDAB in the last 72 hours.  Invalid input(s): FREET3 Anemia Panel: No results for input(s): VITAMINB12, FOLATE, FERRITIN, TIBC, IRON, RETICCTPCT in the last 72 hours.   Physical Exam: Blood pressure 134/54, pulse 81, temperature 99 F (37.2 C), temperature source Oral, resp. rate 18, height 5\' 5"  (1.651 m), weight 97.665 kg (215 lb 5 oz), SpO2 99 %.     General appearance: alert and cooperative Resp: clear to auscultation bilaterally Cardio: regular rate and rhythm GI: soft, non-tender; bowel sounds normal; no masses,  no organomegaly Neurologic: Grossly normal  TELEMETRY: Reviewed telemetry pt in nsr:  ASSESSMENT AND PLAN:  Principal Problem:   Uncontrolled hypertension-hypertension currently well controlled on current regimen. Continue with this plan and low sodium diet.  Active Problems:   Uncontrolled diabetes mellitus ([place])   Demand ischemia (HCC)-no evidence of nstemi    Teodoro Spray., MD, [facility] 11/09/2015 10:25 AM

## 2015-11-09 NOTE — Plan of Care (Signed)
Problem: Safety: Goal: Ability to remain free from injury will improve Outcome: Progressing Patient is a high fall risk. High fall risk protocol in place. Patient up to bathroom and chair this shift and steady on feet with stand-by assist. Patient compliant with calling for needs. Wound vac remains in place, no output this shift.  Patient sugars within range and Lantus 25 units given this shift per orders.

## 2015-11-10 DIAGNOSIS — E1165 Type 2 diabetes mellitus with hyperglycemia: Secondary | ICD-10-CM | POA: Diagnosis not present

## 2015-11-10 LAB — BASIC METABOLIC PANEL
ANION GAP: 6 (ref 5–15)
BUN: 28 mg/dL — ABNORMAL HIGH (ref 6–20)
CHLORIDE: 107 mmol/L (ref 101–111)
CO2: 24 mmol/L (ref 22–32)
Calcium: 9.1 mg/dL (ref 8.9–10.3)
Creatinine, Ser: 2.07 mg/dL — ABNORMAL HIGH (ref 0.44–1.00)
GFR calc Af Amer: 27 mL/min — ABNORMAL LOW (ref >60)
GFR, EST NON AFRICAN AMERICAN: 23 mL/min — AB (ref >60)
GLUCOSE: 91 mg/dL (ref 65–99)
POTASSIUM: 4.3 mmol/L (ref 3.5–5.1)
Sodium: 137 mmol/L (ref 135–145)

## 2015-11-10 LAB — CBC
HEMATOCRIT: 27.1 % — AB (ref 35.0–47.0)
HEMOGLOBIN: 9 g/dL — AB (ref 12.0–16.0)
MCH: 28.6 pg (ref 26.0–34.0)
MCHC: 33.3 g/dL (ref 32.0–36.0)
MCV: 85.8 fL (ref 80.0–100.0)
Platelets: 272 10*3/uL (ref 150–440)
RBC: 3.16 MIL/uL — AB (ref 3.80–5.20)
RDW: 13.5 % (ref 11.5–14.5)
WBC: 8.7 10*3/uL (ref 3.6–11.0)

## 2015-11-10 LAB — GLUCOSE, CAPILLARY
GLUCOSE-CAPILLARY: 68 mg/dL (ref 65–99)
GLUCOSE-CAPILLARY: 83 mg/dL (ref 65–99)
Glucose-Capillary: 217 mg/dL — ABNORMAL HIGH (ref 65–99)

## 2015-11-10 LAB — MAGNESIUM: Magnesium: 2 mg/dL (ref 1.7–2.4)

## 2015-11-10 MED ORDER — INSULIN GLARGINE 100 UNIT/ML ~~LOC~~ SOLN: 25.0000 [IU] | Freq: Every day | SUBCUTANEOUS | Status: AC

## 2015-11-10 MED ORDER — HYDRALAZINE HCL 25 MG PO TABS: 25.0000 mg | ORAL_TABLET | Freq: Three times a day (TID) | ORAL | Status: DC

## 2015-11-10 NOTE — Care Management (Signed)
Discharge to home today per Dr. Margaretmary Eddy. Will be following up at the wound center for wound vac and dressings. [name] states that she will be going to her daughter's home when discharged. Shelbie Ammons RN MSN CCM Care Management [phone]

## 2015-11-10 NOTE — Progress Notes (Signed)
Subjective:  Renal function continues to improve. Creatinine currently down to 2.07. Patient in good spirits today.   Objective:  Vital signs in last 24 hours:  Temp:  [98.4 F (36.9 C)-99.3 F (37.4 C)] 99.3 F (37.4 C) (01/16 0809) Pulse Rate:  [80-84] 80 (01/16 0534) Resp:  [18] 18 (01/15 2123) BP: (113-134)/(47-56) 113/56 mmHg (01/16 0809) SpO2:  [99 %-100 %] 99 % (01/16 0809)  Weight change:  Filed Weights   11/05/15 1049  Weight: 97.665 kg (215 lb 5 oz)    Intake/Output:    Intake/Output Summary (Last 24 hours) at 11/10/15 1331 Last data filed at 11/10/15 0800  Gross per 24 hour  Intake   1000 ml  Output      0 ml  Net   1000 ml     Physical Exam: General: No acute distress,  HEENT Moist oral mucous membranes  Neck supple  Pulm/lungs Normal respiratory effort, clear to auscultation  CVS/Heart Regular rhythm, no rub or gallop  Abdomen:  Soft, nontender, BS prsent  Extremities: B/l LE's wrapped, 1+ edema  Neurologic: Alert, oriented, following commands  Skin: No acute rashes          Basic Metabolic Panel:   Recent Labs Lab 11/06/15 0932 11/07/15 0430 11/08/15 0453 11/09/15 0540 11/10/15 0513  NA 134* 134* 139 137 137  K 3.2* 3.5 4.2 4.1 4.3  CL 99* 100* 106 106 107  CO2 25 23 24 25 24   GLUCOSE 253* 162* 79 97 91  BUN 36* 39* 36* 32* 28*  CREATININE 3.39* 3.95* 2.87* 2.36* 2.07*  CALCIUM 9.3 9.0 9.4 8.9 9.1  MG  --  1.5* 2.0  --  2.0     CBC:  Recent Labs Lab 11/05/15 0512 11/06/15 0628 11/07/15 0430 11/10/15 0513  WBC 13.1* 9.0 8.9 8.7  HGB 11.0* 9.4* 9.3* 9.0*  HCT 33.7* 28.1* 27.6* 27.1*  MCV 86.7 85.6 85.6 85.8  PLT 199 194 203 272      Microbiology:  No results found for this or any previous visit (from the past 720 hour(s)).  Coagulation Studies: No results for input(s): LABPROT, INR in the last 72 hours.  Urinalysis: No results for input(s): COLORURINE, LABSPEC, PHURINE, GLUCOSEU, HGBUR, BILIRUBINUR,  KETONESUR, PROTEINUR, UROBILINOGEN, NITRITE, LEUKOCYTESUR in the last 72 hours.  Invalid input(s): APPERANCEUR    Imaging: No results found.   Medications:     . amLODipine  5 mg Oral Daily  . aspirin EC  81 mg Oral Daily  . atorvastatin  40 mg Oral Daily  . azaTHIOprine  50 mg Oral Daily  . bisacodyl  10 mg Rectal Daily  . calcium-vitamin D  0.5 tablet Oral Daily  . cholecalciferol  1,000 Units Oral Daily  . collagenase  1 application Topical Daily  . heparin subcutaneous  5,000 Units Subcutaneous Q12H  . hydrALAZINE  25 mg Oral 3 times per day  . insulin aspart  0-15 Units Subcutaneous TID WC  . insulin aspart  0-5 Units Subcutaneous QHS  . insulin glargine  25 Units Subcutaneous QHS  . magnesium oxide  400 mg Oral Daily  . metoprolol tartrate  50 mg Oral BID  . pantoprazole  40 mg Oral Daily  . polyethylene glycol  17 g Oral Daily  . potassium chloride  10 mEq Oral Daily  . potassium chloride  40 mEq Oral Once  . potassium chloride  40 mEq Oral Once  . predniSONE  35 mg Oral Q breakfast  . sodium  chloride  1,000 mL Intravenous Once  . sodium chloride  3 mL Intravenous Q12H   sodium chloride, acetaminophen, albuterol, hydrALAZINE, morphine injection, ondansetron (ZOFRAN) IV, oxyCODONE-acetaminophen, promethazine, sodium chloride  Assessment/ Plan:  70 y.o. African American female  With history of hypertension, type 2 diabetes with nephropathy, hyperlipidemia, hypothyroidism, coronary disease, hysterectomy, temporal arthritis requiring high-dose steroids in 2016, inflammatory arthritis, allergic rhinitis  1.  Acute renal failure on chronic kidney disease stage III/Diabetes mellitus type 2 with CKD> Baseline creatinine is 1.8/GFR of 34 from December 2016.  Chronic kidney disease seems to be secondary to diabetic nephropathy Acute renal failure is likely related to volume depletion possibly causing ATN Renal ultrasound is negative for hydronephrosis. - Kidney function  continues to improve.  Creatinine currently down to 2.07 which is close to her baseline.  Patient will need followup with [place] in the office in the next one to 2 weeks.  Avoid nephrotoxins as possible.  2.  Peripheral edema Both lower extremities wrapped at the moment.  Continue compression to help treat edema.     LOS: 5 Koben Daman 1/16/20171:31 PM

## 2015-11-10 NOTE — Discharge Instructions (Signed)
Activity as tolerated  diet diabetic and low-salt Follow-up with wound care tomorrow to change the wound VAC dressing Follow-up with primary care physician in a week Follow-up with nephrology at [facility] in a week Follow-up with cardiology Dr. Ubaldo Glassing in a month  outpatient follow-up with lifestyle, diabetic clinic in a week

## 2015-11-10 NOTE — Care Management Important Message (Signed)
Important Message  Patient Details  Name: [name] MRN: [id number] Date of Birth: October 11, 1946   Medicare Important Message Given:  Yes    Juliann Pulse A Monai Hindes 11/10/2015, 9:35 AM

## 2015-11-10 NOTE — Progress Notes (Signed)
Received MD order to discharge patient to home, reviewed prescriptions appointment home meds and discharge instructions with patient and daughter and both verbalized understanding, discharge to home in wheelchair with daughter and auxiliary

## 2015-11-10 NOTE — Consult Note (Signed)
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 Seen at wound care center. Spoke with Di Kindle, RN at [facility].  Patient was to be seen at center 11/07/15 and appointment was missed.  Advised to take off all dressings today and assess and will be seen at [facility] once discharged.  Wound type:Chronic venous Pressure Ulcer POA: N/A Right posterior lower leg  NPWT dressing removed, cleansed gently.  NS moist gauze to wound bed.  Patient is discharging today and uses a NPWT device that [facility] does not stock supplies for. Daughter is trying to get patient seen today at [facility].   Right anterior lower leg 3.5 cm x 3 cm 100% devitalized tissue to wound bed. Linear open area leading posterior wound measures 0.5 cm x 1.3 cm 100% adherent slough. Maceration noted to periwound   Santyl to wound bed, NS moist gauze (covering wound bed only) and dry gauze/kerlix as secondary dressing.  Left leg has Apligraf in place. Self adherent Coban dressing is in place. Di Kindle states this can be removed today and moist wound healing treatment implemented since 1/10/28/15 appointment was missed.  Wound, legs and feet gently cleansed.  Meptiel dressing is placed over wound bed.  Kerlix and Coban are applied.  Wound bed: ruddy red to right posterior leg and left anterior leg.  devitalized tissue to right anterior lower leg.   Drainage (amount, consistency, odor) Minimal serosanguinous drainage. Musty odor from Apligraf.  Periwound:Inact, some maceration to right anterior lower wound.  Dressing procedure/placement/frequency: Cleanse right leg ulcer with NS and pat gently dry. Apply Mepitel and  NS moist gauze to wound bed Cleanse anterior leg ulcer with NS and pat dry. Apply Santyl ointment, cover with NS moist 2x2 and dry dressing. Follow up with wound care center regarding NPWT and further wound care.  Will not follow at this time.  Please re-consult if needed.   Domenic Moras RN [name] [place] Pager [phone]

## 2015-11-10 NOTE — Discharge Summary (Signed)
[facility] NAME: [name]    MR#:  [id number]  DATE OF BIRTH:  Jun 03, 1946  DATE OF ADMISSION:  11/05/2015 ADMITTING PHYSICIAN: Vaughan Basta, MD  DATE OF DISCHARGE: 11/10/2015  PRIMARY CARE PHYSICIAN: Singh,[name], MD    ADMISSION DIAGNOSIS:  Uncontrolled hypertension  DISCHARGE DIAGNOSIS:  Principal Problem:   Uncontrolled hypertension Active Problems:   Uncontrolled diabetes mellitus ([place])   Demand ischemia ([place])  acute kidney injury  SECONDARY DIAGNOSIS:   Past Medical History  Diagnosis Date  . Diabetes mellitus without complication ([place])     Type II  . Ulcer of left lower leg [facility]) February 07, 2015  . Chronic renal insufficiency February 07, 2015  . Coronary artery dilation ([place])   . Hypertension   . Hyperlipidemia   . Arthritis     Inflammatory  . Thyroid disease   . Sinus problem     HOSPITAL COURSE:   *Acute renal failure secondary to dehydration-from GI losses-prerenal--> ATN Nausea and vomiting resolved Echo with normal ejection fraction Clinically improving with hydration Avoid nephrotoxins Renal ultrasound is normal with no hydronephrosis Repeat BMP with worsening creatinine- 1.8 --1.9-3.39-3.95-- 2.87--2.36--2.07 Appreciate Dr. Candiss Norse recommendations, was seen by dr.Kolluru in the past with baseline Cr at 1.8 Okay to discharge patient from nephrology standpoint. Patient is to follow up with outpatient nephrology in a week    *Intractable nausea and vomiting=-probably acute viral syndrome  Improving clinically advanced diet as tolerated PPI   *Hypomagnesemia Magnesium sulfate 2 g IV given, magnesium is at 2.0  *ch peripheral edema Low salt diet and keep legs elevated  Lasix on hold 2/2 aki during the hospital course which is resumed at the time of discharge  *Abnormal troponin probably from demand ischemia Appreciate cardiology recommendations, no interventions needed  at this time Echo with normal ejection fraction 60-65%. Outpatient follow-up with cardiology Dr. Ubaldo Glassing in a month  *Hypokalemia Replace potassium  * Uncontrolled Htn-blood pressure is better today  Hydralazine is added to the regimen Continue amlodipine and metoprolol   BP stable.  * Uncontrolled DM-Accu-Cheks are better  Lantus dose increased to 25 units subcutaneously once daily  Outpatient follow-up with diabetic clinic in a week   * Hypoxia- ac respi failure  Likely due to bronchitis or may have underlying COPD-as she has a long smoking history.  Nebs, Currently no wheezing.   weaned to room air.  * Leg ulcer and wound vac  continue wound care and outpatient follow-up with wound care clinic  tomorrow  NO signs of infection.  Continue Abx. Unable to change her wound VAC dressing during the hospital course as RNs are uncomfortable   * COnstipation  Laxatives  as needed  *Generalized weakness-deconditioning with PT is ordered-no PT needs identified      DISCHARGE CONDITIONS:   fair  CONSULTS OBTAINED:  Treatment Team:  Nicholes Mango, MD Teodoro Spray, MD Murlean Iba, MD   PROCEDURES none  DRUG ALLERGIES:   Allergies  Allergen Reactions  . Doxycycline Other (See Comments)    Reaction: unknown  . Levaquin [Levofloxacin] Other (See Comments)    Causes joint pain  . Clindamycin/Lincomycin Itching    Petechia  . Penicillins Rash and Other (See Comments)    Has patient had a PCN reaction causing immediate rash, facial/tongue/throat swelling, SOB or lightheadedness with hypotension: Yes Has patient had a PCN reaction causing severe rash involving mucus membranes or skin necrosis: No Has patient had a  PCN reaction that required hospitalization No Has patient had a PCN reaction occurring within the last 10 years: No If all of the above answers are "NO", then may proceed with Cephalosporin use.  . Sulfa Antibiotics Swelling and Rash    DISCHARGE  MEDICATIONS:   Current Discharge Medication List    START taking these medications   Details  hydrALAZINE (APRESOLINE) 25 MG tablet Take 1 tablet (25 mg total) by mouth every 8 (eight) hours. Qty: 90 tablet, Refills: 0      CONTINUE these medications which have CHANGED   Details  insulin glargine (LANTUS) 100 UNIT/ML injection Inject 0.25 mLs (25 Units total) into the skin at bedtime. Qty: 10 mL, Refills: 2      CONTINUE these medications which have NOT CHANGED   Details  acetaminophen (TYLENOL) 500 MG tablet Take 500 mg by mouth every 6 (six) hours as needed for mild pain, fever or headache.     amLODipine (NORVASC) 5 MG tablet Take 5 mg by mouth daily.    aspirin 81 MG tablet Take 81 mg by mouth 2 (two) times daily.     atorvastatin (LIPITOR) 40 MG tablet Take 40 mg by mouth daily.    azaTHIOprine (IMURAN) 50 MG tablet Take 50 mg by mouth daily.    calcium-vitamin D (OSCAL WITH D) 250-125 MG-UNIT tablet Take 1 tablet by mouth daily.    cholecalciferol (VITAMIN D) 1000 units tablet Take 1,000 Units by mouth daily.    collagenase (SANTYL) ointment Apply 1 application topically daily.    furosemide (LASIX) 20 MG tablet Take 20 mg by mouth daily.     HYDROcodone-acetaminophen (NORCO) 5-325 MG tablet Take 1 tablet by mouth every 6 (six) hours as needed for moderate pain. Qty: 30 tablet, Refills: 0    insulin regular (NOVOLIN R,HUMULIN R) 100 units/mL injection Inject 12 Units into the skin 3 (three) times daily before meals.    levocetirizine (XYZAL) 5 MG tablet Take 5 mg by mouth every evening.    loratadine (CLARITIN) 10 MG tablet Take 10 mg by mouth daily.    magnesium oxide (MAG-OX) 400 MG tablet Take 400 mg by mouth 2 (two) times daily.    metoprolol (LOPRESSOR) 50 MG tablet Take 50 mg by mouth 2 (two) times daily.    omeprazole (PRILOSEC) 20 MG capsule Take 20 mg by mouth daily.    pioglitazone (ACTOS) 15 MG tablet Take 15 mg by mouth daily.    potassium  chloride (K-DUR) 10 MEQ tablet Take 10 mEq by mouth daily.    predniSONE (DELTASONE) 10 MG tablet Take 35 mg by mouth daily with breakfast.    famotidine (PEPCID) 20 MG tablet Take 1 tablet (20 mg total) by mouth 2 (two) times daily. Qty: 8 tablet, Refills: 0      STOP taking these medications     hydrochlorothiazide (HYDRODIURIL) 25 MG tablet      insulin NPH Human (HUMULIN N,NOVOLIN N) 100 UNIT/ML injection      lisinopril (PRINIVIL,ZESTRIL) 20 MG tablet      cephALEXin (KEFLEX) 500 MG capsule          DISCHARGE INSTRUCTIONS:   Activity as tolerated  diet diabetic and low-salt Follow-up with wound care tomorrow to change the wound VAC dressing Follow-up with primary care physician in a week Follow-up with nephrology at [facility] in a week Follow-up with cardiology Dr. Ubaldo Glassing in a month Outpatient follow-up with diabetic clinic in a week    DIET:  Diabetic diet,low salt   DISCHARGE CONDITION:  Fair  ACTIVITY:  Activity as tolerated  OXYGEN:  Home Oxygen: No.   Oxygen Delivery: room air  DISCHARGE LOCATION:  home   If you experience worsening of your admission symptoms, develop shortness of breath, life threatening emergency, suicidal or homicidal thoughts you must seek medical attention immediately by calling 911 or calling your MD immediately  if symptoms less severe.  You Must read complete instructions/literature along with all the possible adverse reactions/side effects for all the Medicines you take and that have been prescribed to you. Take any new Medicines after you have completely understood and accpet all the possible adverse reactions/side effects.   Please note  You were cared for by a hospitalist during your hospital stay. If you have any questions about your discharge medications or the care you received while you were in the hospital after you are discharged, you can call the unit and asked to speak with the hospitalist on call if  the hospitalist that took care of you is not available. Once you are discharged, your primary care physician will handle any further medical issues. Please note that NO REFILLS for any discharge medications will be authorized once you are discharged, as it is imperative that you return to your primary care physician (or establish a relationship with a primary care physician if you do not have one) for your aftercare needs so that they can reassess your need for medications and monitor your lab values.     Today  Chief Complaint  Patient presents with  . Shortness of Breath    patient is resting comfortably. Denies any shortness of breath or chest pain. Tolerating diet Wants to go home. Patient was seen by Dr. Dallie Dad in the past  ROS:  CONSTITUTIONAL: Denies fevers, chills. Denies any fatigue, weakness.  EYES: Denies blurry vision, double vision, eye pain. EARS, NOSE, THROAT: Denies tinnitus, ear pain, hearing loss. RESPIRATORY: Denies cough, wheeze, shortness of breath.  CARDIOVASCULAR: Denies chest pain, palpitations, edema.  GASTROINTESTINAL: Denies nausea, vomiting, diarrhea, abdominal pain. Denies bright red blood per rectum. GENITOURINARY: Denies dysuria, hematuria. ENDOCRINE: Denies nocturia or thyroid problems. HEMATOLOGIC AND LYMPHATIC: Denies easy bruising or bleeding. SKIN: Denies rash or lesion. right lower extremity with wound VAC MUSCULOSKELETAL: Denies pain in neck, back, shoulder, knees, hips or arthritic symptoms.  NEUROLOGIC: Denies paralysis, paresthesias.  PSYCHIATRIC: Denies anxiety or depressive symptoms.   VITAL SIGNS:  Blood pressure 113/56, pulse 80, temperature 99.3 F (37.4 C), temperature source Axillary, resp. rate 18, height 5\' 5"  (1.651 m), weight 97.665 kg (215 lb 5 oz), SpO2 99 %.  I/O:   Intake/Output Summary (Last 24 hours) at 11/10/15 1218 Last data filed at 11/10/15 0800  Gross per 24 hour  Intake   1120 ml  Output      0 ml  Net   1120 ml     PHYSICAL EXAMINATION:  GENERAL:  70 y.o.-year-old patient lying in the bed with no acute distress.  EYES: Pupils equal, round, reactive to light and accommodation. No scleral icterus. Extraocular muscles intact.  HEENT: Head atraumatic, normocephalic. Oropharynx and nasopharynx clear.  NECK:  Supple, no jugular venous distention. No thyroid enlargement, no tenderness.  LUNGS: Normal breath sounds bilaterally, no wheezing, rales,rhonchi or crepitation. No use of accessory muscles of respiration.  CARDIOVASCULAR: S1, S2 normal. No murmurs, rubs, or gallops.  ABDOMEN: Soft, non-tender, non-distended. Bowel sounds present. No organomegaly or mass.  EXTREMITIES: No pedal edema, cyanosis, or clubbing.  NEUROLOGIC: Cranial nerves II through XII are intact. Muscle strength 5/5 in all extremities. Sensation intact. Gait not checked.  PSYCHIATRIC: The patient is alert and oriented x 3.  SKIN: No obvious rash, lesion, or ulcer.  Right lower extremity with wound VAC  DATA REVIEW:   CBC  Recent Labs Lab 11/10/15 0513  WBC 8.7  HGB 9.0*  HCT 27.1*  PLT 272    Chemistries   Recent Labs Lab 11/10/15 0513  NA 137  K 4.3  CL 107  CO2 24  GLUCOSE 91  BUN 28*  CREATININE 2.07*  CALCIUM 9.1  MG 2.0    Cardiac Enzymes  Recent Labs Lab 11/05/15 1952  TROPONINI 0.15*    Microbiology Results  Results for orders placed or performed during the hospital encounter of 09/04/15  Anaerobic culture     Status: None   Collection Time: 09/04/15  3:45 PM  Result Value Ref Range Status   Specimen Description [facility]  Final   Special Requests NONE  Final   Culture NO ANAEROBES ISOLATED  Final   Report Status 09/08/2015 FINAL  Final  Wound culture     Status: None   Collection Time: 09/04/15  3:45 PM  Result Value Ref Range Status   Specimen Description [facility]  Final   Special Requests NONE  Final   Gram Stain RARE WBC SEEN NO ORGANISMS SEEN   Final   Culture   Final    LIGHT  GROWTH ESCHERICHIA COLI LIGHT GROWTH PSEUDOMONAS AERUGINOSA MODERATE GROWTH PROTEUS MIRABILIS RARE GROWTH KLEBSIELLA PNEUMONIAE    Report Status 09/08/2015 FINAL  Final   Organism ID, Bacteria ESCHERICHIA COLI  Final   Organism ID, Bacteria PSEUDOMONAS AERUGINOSA  Final   Organism ID, Bacteria PROTEUS MIRABILIS  Final   Organism ID, Bacteria KLEBSIELLA PNEUMONIAE  Final      Susceptibility   Escherichia coli - MIC*    AMPICILLIN 16 INTERMEDIATE Intermediate     CEFTAZIDIME <=1 SENSITIVE Sensitive     CEFAZOLIN 16 INTERMEDIATE Intermediate     CEFTRIAXONE <=1 SENSITIVE Sensitive     CIPROFLOXACIN <=0.25 SENSITIVE Sensitive     GENTAMICIN <=1 SENSITIVE Sensitive     IMIPENEM <=0.25 SENSITIVE Sensitive     TRIMETH/SULFA <=20 SENSITIVE Sensitive     PIP/TAZO Value in next row Sensitive      SENSITIVE<=4    LEVOFLOXACIN Value in next row Sensitive      SENSITIVE<=0.12    AMPICILLIN/SULBACTAM Value in next row Sensitive      SENSITIVE8    * LIGHT GROWTH ESCHERICHIA COLI   Klebsiella pneumoniae - MIC*    AMPICILLIN Value in next row Resistant      SENSITIVE8    CEFTAZIDIME Value in next row Sensitive      SENSITIVE8    CEFAZOLIN Value in next row Sensitive      SENSITIVE8    CEFTRIAXONE Value in next row Sensitive      SENSITIVE8    CIPROFLOXACIN Value in next row Sensitive      SENSITIVE8    GENTAMICIN Value in next row Sensitive      SENSITIVE8    IMIPENEM Value in next row Sensitive      SENSITIVE8    TRIMETH/SULFA Value in next row Sensitive      SENSITIVE8    PIP/TAZO Value in next row Sensitive      SENSITIVE<=4    AMPICILLIN/SULBACTAM Value in next row Resistant      RESISTANT>=32  LEVOFLOXACIN Value in next row Sensitive      SENSITIVE<=0.12    * RARE GROWTH KLEBSIELLA PNEUMONIAE   Proteus mirabilis - MIC*    AMPICILLIN Value in next row Sensitive      SENSITIVE<=0.12    CEFTAZIDIME Value in next row Sensitive      SENSITIVE<=0.12    CEFAZOLIN Value in next  row Sensitive      SENSITIVE<=0.12    CEFTRIAXONE Value in next row Sensitive      SENSITIVE<=0.12    CIPROFLOXACIN Value in next row Sensitive      SENSITIVE<=0.12    GENTAMICIN Value in next row Sensitive      SENSITIVE<=0.12    IMIPENEM Value in next row Sensitive      SENSITIVE<=0.12    TRIMETH/SULFA Value in next row Sensitive      SENSITIVE<=0.12    PIP/TAZO Value in next row Sensitive      SENSITIVE<=4    LEVOFLOXACIN Value in next row Sensitive      SENSITIVE<=0.12    CEFOTAXIME Value in next row Sensitive      SENSITIVE<=0.12    AMPICILLIN/SULBACTAM Value in next row Sensitive      SENSITIVE<=2    * MODERATE GROWTH PROTEUS MIRABILIS   Pseudomonas aeruginosa - MIC*    CEFTAZIDIME Value in next row Sensitive      SENSITIVE<=2    CIPROFLOXACIN Value in next row Sensitive      SENSITIVE<=2    GENTAMICIN Value in next row Sensitive      SENSITIVE<=2    IMIPENEM Value in next row Sensitive      SENSITIVE<=2    PIP/TAZO Value in next row Sensitive      SENSITIVE<=4    LEVOFLOXACIN Value in next row Sensitive      SENSITIVE1    * LIGHT GROWTH PSEUDOMONAS AERUGINOSA    RADIOLOGY:  US Renal  11/07/2015  CLINICAL DATA:  Acute kidney injury EXAM: RENAL / URINARY TRACT ULTRASOUND COMPLETE COMPARISON:  10/21/2006 FINDINGS: Right Kidney: Length: 10.5 cm. 15 mm cyst in the upper pole. No hydronephrosis or evidence of solid mass. Present hilar Doppler signal. Left Kidney: Length: 10 cm. Rounded appearance of the anterior interpolar kidney has a stable appearance since 2007 (see transverse imaging at that time). Better demonstrated on 06/07/2014 scan there is corticomedullary differentiation and nondisplaced internal vessels at this level compatible with prominent parenchyma. No hydronephrosis. No suspected mass. Bladder: Appears normal for degree of bladder distention. IMPRESSION: No hydronephrosis.  Stable since 2007. Electronically Signed   By: Monte Fantasia M.D.   On: 11/07/2015  11:01    EKG:   Orders placed or performed during the hospital encounter of 11/05/15  . ED EKG  . ED EKG  . EKG 12-Lead  . EKG 12-Lead      Management plans discussed with the patient, family and they are in agreement.  CODE STATUS:     Code Status Orders        Start     Ordered   11/05/15 1027  Full code   Continuous     11/05/15 1026    Code Status History    Date Active Date Inactive Code Status Order ID Comments User Context   11/05/2015  7:14 AM 11/05/2015 10:26 AM Full Code [id number]  Saundra Shelling, MD ED    Advance Directive Documentation        Most Recent Value   Type of Advance Directive  Healthcare Power of [place],  Living will   Pre-existing out of facility DNR order (yellow form or pink MOST form)     "MOST" Form in Place?        TOTAL TIME TAKING CARE OF THIS PATIENT: 45  minutes.    @[facility] @  on 11/10/2015 at 12:18 PM  Between 7am to 6pm - Pager - [phone]  After 6pm go to [email or web address] - password [email or web address] [facility]  Office  [phone]  CC: Primary care physician; Glendon Axe, MD

## 2015-11-11 LAB — GLUCOSE, CAPILLARY
Glucose-Capillary: 133 mg/dL — ABNORMAL HIGH (ref 65–99)
Glucose-Capillary: 69 mg/dL (ref 65–99)
Glucose-Capillary: 161 mg/dL — ABNORMAL HIGH (ref 65–99)

## 2015-11-12 DIAGNOSIS — N189 Chronic kidney disease, unspecified: Secondary | ICD-10-CM | POA: Diagnosis not present

## 2015-11-12 DIAGNOSIS — I83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: Secondary | ICD-10-CM | POA: Diagnosis not present

## 2015-11-12 DIAGNOSIS — L88 Pyoderma gangrenosum: Secondary | ICD-10-CM | POA: Insufficient documentation

## 2015-11-12 DIAGNOSIS — L03116 Cellulitis of left lower limb: Secondary | ICD-10-CM | POA: Diagnosis not present

## 2015-11-12 DIAGNOSIS — I83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: Secondary | ICD-10-CM | POA: Diagnosis not present

## 2015-11-12 DIAGNOSIS — L97322 Non-pressure chronic ulcer of left ankle with fat layer exposed: Secondary | ICD-10-CM | POA: Diagnosis not present

## 2015-11-12 DIAGNOSIS — I2510 Atherosclerotic heart disease of native coronary artery without angina pectoris: Secondary | ICD-10-CM | POA: Diagnosis not present

## 2015-11-12 DIAGNOSIS — E7800 Pure hypercholesterolemia, unspecified: Secondary | ICD-10-CM | POA: Diagnosis not present

## 2015-11-12 DIAGNOSIS — I129 Hypertensive chronic kidney disease with stage 1 through stage 4 chronic kidney disease, or unspecified chronic kidney disease: Secondary | ICD-10-CM | POA: Diagnosis not present

## 2015-11-12 DIAGNOSIS — E11622 Type 2 diabetes mellitus with other skin ulcer: Secondary | ICD-10-CM | POA: Diagnosis present

## 2015-11-12 DIAGNOSIS — I890 Lymphedema, not elsewhere classified: Secondary | ICD-10-CM | POA: Diagnosis not present

## 2015-11-13 NOTE — Progress Notes (Signed)
MILLS, AUTRY ([id number]) Visit Report for 11/12/2015 Arrival Information Details Patient Name: [name], [name] Date of Service: 11/12/2015 3:45 PM Medical Record Patient Account Number: [id number] Number: Treating RN: Baruch Gouty, RN, [name], Rita June 09, 1946 [phone] y.o. Other Clinician: Date of Birth/Sex: Female) Treating BURNS III, Primary Care Physician/Extender: Lauralyn Primes, [place] Physician: Referring Physician: Glendon Axe Weeks in Treatment: 6 Visit Information History Since Last Visit Added or deleted any medications: No Patient Arrived: Ambulatory Any new allergies or adverse reactions: No Arrival Time: 15:30 Had a fall or experienced change in No Accompanied By: grd dtr activities of daily living that may affect Transfer Assistance: None risk of falls: Patient Identification Verified: Yes Signs or symptoms of abuse/neglect since last No Secondary Verification Process Yes visito Completed: Hospitalized since last visit: No Patient Requires Transmission- No Has Dressing in Place as Prescribed: Yes Based Precautions: Pain Present Now: No Patient Has Alerts: Yes Patient Alerts: Patient on Blood Thinner Electronic Signature(s) Signed: 11/12/2015 4:59:08 PM By: Regan Lemming [name], RN Entered By: Regan Lemming on 11/12/2015 16:01:27 [place], [place] D. ([id number]) -------------------------------------------------------------------------------- Encounter Discharge Information Details Patient Name: [name], [name] Date of Service: 11/12/2015 3:45 PM Medical Record Patient Account Number: [id number] Number: Treating RN: Baruch Gouty, RN, [name], Rita 29-Jun-1946 [phone] y.o. Other Clinician: Date of Birth/Sex: Female) Treating BURNS III, Primary Care Physician/Extender: Lauralyn Primes, [place] Physician: Referring Physician: Glendon Axe Weeks in Treatment: 40 Encounter Discharge Information Items Discharge Pain Level: 0 Discharge Condition: Stable Ambulatory Status:  Ambulatory Discharge Destination: Home Private Transportation: Auto Accompanied By: grd dtr Schedule Follow-up Appointment: No Medication Reconciliation completed and No provided to Patient/Care Diahann Guajardo: Clinical Summary of Care: Electronic Signature(s) Signed: 11/12/2015 4:59:08 PM By: Regan Lemming [name], RN Entered By: Regan Lemming on 11/12/2015 16:03:05 [name], [name] Kitchen ([id number]) -------------------------------------------------------------------------------- Patient/Caregiver Education Details Patient Name: Kristin Stare D. Date of Service: 11/12/2015 3:45 PM Medical Record Patient Account Number: [id number] Number: Treating RN: Baruch Gouty, RN, [name], Rita 1946-06-30 [phone] y.o. Other Clinician: Date of Birth/Gender: Female) Treating BURNS III, Primary Care Physician/Extender: Lauralyn Primes, [place] Physician: Weeks in Treatment: 40 Referring Physician: Glendon Axe Education Assessment Education Provided To: Patient Education Topics Provided Basic Hygiene: Methods: Explain/Verbal Responses: State content correctly Welcome To [facility]: Methods: Explain/Verbal Responses: State content correctly Electronic Signature(s) Signed: 11/12/2015 4:59:08 PM By: Regan Lemming [name], RN Entered By: Regan Lemming on 11/12/2015 16:02:40

## 2015-11-14 ENCOUNTER — Encounter: Payer: Commercial Managed Care - HMO | Admitting: Surgery

## 2015-11-14 DIAGNOSIS — E11622 Type 2 diabetes mellitus with other skin ulcer: Secondary | ICD-10-CM | POA: Diagnosis not present

## 2015-11-15 NOTE — Progress Notes (Addendum)
SHIVA, EKLOF ([id number]) Visit Report for 11/14/2015 Chief Complaint Document Details Patient Name: [name], [name] 11/14/2015 10:15 Date of Service: AM Medical Record [id number] Number: Patient Account Number: [id number] August 19, 1946 (70 y.o. Treating RN: [name] Date of Birth/Sex: Female) Other Clinician: Primary Care Physician: [facility], Delana Meyer Treating [name] Referring Physician: Glendon Axe Physician/Extender: Weeks in Treatment: 16 Information Obtained from: Patient Chief Complaint Patient presents to the wound care center for a consult due non healing wound 70 year old patient comes with a history of having a ulcer on the left lower extremity for the past 4 weeks. she says she's had swelling of both lower extremities for about a year after she started having prednisone. 02/07/2015 -- her vascular appointments obtained were in the first and third week of June. she is able to go to [place] and we will try and get her some earlier appointments. Other than that nothing else has changed in her management. Electronic Signature(s) Signed: 11/14/2015 11:22:30 AM By: [name], [name] Entered By: [name] on 11/14/2015 11:22:29 ROSSLYNN, TENNIS ([id number]) -------------------------------------------------------------------------------- HPI Details Patient Name: [name], [name] 11/14/2015 10:15 Date of Service: AM Medical Record [id number] Number: Patient Account Number: [id number] [birth year] (71 y.o. Treating RN: [name] Date of Birth/Sex: Female) Other Clinician: Primary Care Physician: [facility], Delana Meyer Treating London Tarnowski Referring Physician: Glendon Axe Physician/Extender: Weeks in Treatment: 50 History of Present Illness HPI Description: 70 year old patient who is known to have diabetes mellitus type 2, chronic renal insufficiency, coronary artery disease, hypertension, hypercholesterolemia, temporal arteritis and inflammatory  arthritiss also has a history of having a hysterectomy and some orthopedic related surgeries. The ulcer on the left lower extremity started off as a blister and then. Got progressively worse. She does not have any fever or chills and has not had any recent surgical intervention for this. Her last hemoglobin A1c was 10.1 in September 2015. She has been recently put on doxycycline by her PCP. She is now also allergic to doxycycline and was this was changed over to Keflex. 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 She does see a cardiologist and also takes a diuretic. 02/07/2015 her arterial and venous duplex studies to be done have dates been given as the first and third week of June. This is at [facility]. We are going to try and get early appointments at [facility]. other than that nothing has changed in her management. 02/14/2015 -- we have been able to get her an appointment in [facility] on May 20 which is much earlier than her previous ones at [place]. She continues with her prednisone and her sugars are in the range of 150-200. 02/21/2015 We were able to get a vascular lab workup for her today and she is going to be there at 2:00 this afternoon. the swelling of her leg has gone down significantly but she still has some tenderness over the wounds. 02/28/2015 - She has had one of two vascular workups done, and this coming Tuesday has another, at [facility]. She continues to be on steroid medications. She has significant sensitivity in her left lower extremity and has pain suggestive of neuropathic pain and I have asked her to address this with her primary care physician. 03/07/2015 -- The patient saw Dr. Lucky Cowboy for a consultation and he has had her arteries are okay but she has 2 incompetent veins on the  left lower extremity and he is going to set her up  for surgery. Official report is awaited. Addendum: Official reports are now available and on 03/04/2015. She was seen and lower extremity venous duplex exam was done. There was reflux present within the left greater saphenous vein below the knee and also the left small saphenous vein. Arterial duplex showed normal triphasic waveforms throughout the left lower extremity without any significant stenosis. Her ABIs were noncompressible bilaterally but a waveforms were normal and a digital pressures were normal bilaterally consistent with no significant arterial insufficiency. He has recommended endovenous ablation of both the left small saphenous and the left great saphenous vein. This would still be scheduled later. 03/14/2015 -- she has heard back from the vascular office and has surgery scheduled for sometime in July. [name], [name] ([id number]) Her rheumatologist has decreased her prednisone dosage but she still on it. She has also had cataract surgery in her right eye recently this week. 03/20/2015 - No new complaints today. Pain improved. No fever or chills. Tolerating 2 layer compression. 04/14/2015 -- she was doing very well today she went off on vacation and now her edema has increased markedly the ulceration is bigger and her diabetes is not under control. 04/21/2015 -- I spoke to her PCP Dr. Candiss Norse and discussed the management which would include being seen by a general surgeon for debridement and taking multiple punch biopsies which would help in establishing the diagnosis of this is a vasculitis. She is agreeable about this and will set her up for the procedure with Dr. Tamala Julian at [facility]. She was seen by the surgeon Dr. Jamal Collin. 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 She is scheduled to have laser ablation done next week.Pt was sent here for possible biopsy to  look for vasculitis. Feel it would be better to wait after laser ablation is completed- the ulcer may heal fully and biopsy may not be necessary 04/29/2015 -- she had the venous ablation done by Dr. Lucky Cowboy last Friday and we do not have any notes yet. She is doing fine otherwise. 05/06/2015 --Review of her recent vascular intervention shows that she was seen by Dr. Lucky Cowboy on 04/29/2015. The follow-up duplex which was done showed that both the great saphenous vein and the small saphenous vein remained patent with reflux consistent with an unsuccessful ablation. He has rescheduled her for another the endovenous ablation to be done in about 4 weekso time. 05/13/2015 -- he was seen by her surgeon Dr. Jamal Collin who asked her to continue with conservative therapy and he would speak to Dr. Lucky Cowboy about her management. Dr. Lucky Cowboy is going to schedule her surgery in the middle of August for a repeat endovenous ablation. Her pus culture from last week has grown : [place] her noted her sensitivity report but due to her multiple allergies I had tried clindamycin and she developed a rash with this too. She has been prescribed and anti-buttocks in the ER and is has it at home and she will let is know what she is going to be taking. 05/20/2015 -- she has developed a small spot on her right lower extremity but besides that it is not a full fledged ulceration. She did not get to see Dr. Lucky Cowboy last week and hopefully she will see him in the near future. 05/27/2015 -she is still awaiting her  appointment with Dr.Dew and her vascular procedure is not scheduled until August 19. She will be seeing her PCP tomorrow and I have asked her to convey our discussion so that she is aware that debridement has not been done yet. 06/03/2015 -- was seen by her rheumatologist Dr. Dorthula Matas, who has been treating her for temporal  arteritis and in his note has mentioned the possibility of vasculitis or pyoderma gangrenosum. He is lowering her prednisone to 12-1/2 mg for 1 month and then 10 mg per the next month. I will again make an attempt to speak to her PCP Dr. Candiss Norse and her surgeon Dr. 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 06/17/2015 -- Dr.Dew did her vascular procedure last week and a follow-up venous ultrasound shows good resolution of the veins as per the patient's history. He is to see her back in 2 weeks. ARETTA, [name] ([id number]) 07/07/2015. -- the patient has had a heavy growth of Proteus mirabilis and Enterococcus faecalis. These are sensitive to several drugs but the problem is she has allergies to all of these and hence I would like her to see Dr. Ola Spurr for this. She is also due to see Dr. Lucky Cowboy tomorrow and I will discussed the management with him including debridement under anesthesia and possible biopsies. 07/14/2015 -- she has an appointment to see Dr. Ola Spurr tomorrow and did see Dr. Bunnie Domino PA who will discuss my request with him. 07/21/2015 -- saw Dr. Ola Spurr was able to do a test on her and has put her on amoxicillin. She has been tolerating that and has had no problems with allergies to this. 07/28/2015 -- Last Friday I spoke to Dr. Leotis Pain regarding her care and he said that her right leg did not need any surgery and on the left leg was doing pretty good. We did agree that if she undergoes any procedure in the future he would do a couple of punch biopsies of the wound. 08/18/2015 -- her right leg is very tender and there is significant amount of slough. The left leg is looking much better 09/02/2015 -- she is going to have a debridement and punch biopsies of her right lower extremity by Dr. Leotis Pain this coming Thursday. Also seen Dr. Ola Spurr who has continued her on  ciprofloxacin. 09/09/2015 -- on 09/04/2015 Dr. Leotis Pain took her to surgery - Irrigation and excisional debridement of skin, soft tissue, and muscle to about 40 cm2 to the right posterior calf with biopsy. Pathology results are -- DIAGNOSIS: SKIN, RIGHT LOWER EXTREMITY; BIOPSY: SKIN AND SOFT TISSUE WITH ULCERATION, SEE NOTE. - NEGATIVE FOR DYSPLASIA AND MALIGNANCY. Note: There is acute inflammation and ulceration of the epidermis. The dermis shows nonspecific inflammation, neovascularization, and hemosiderin deposition. The differential diagnosis for these findings includes stasis dermatitis, nonspecific dermatitis, and infection. Correlation with clinical findings is required. A PAS fungal stain is obtained and results will be reported in an addendum. cultures were also sent and this grew Pseudomonas aeruginosa, Escherichia coli, Proteus mirabilis, Klebsiella pneumoniae and they were all sensitive to ciprofloxacin which she is on. 09/16/2015 -- he saw Dr. 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 He is going to call the patient in and discuss with her the management possibly with Imuran. I will continue treating  her locally. 09/23/2015 -- she saw Dr. Ola Spurr today who was going to continue the antibiotics for now and stop after this course. She has an appointment to see Dr. Jefm Bryant in about a week's time. Her wound VAC has arrived but she did not bring it with her today. We will try and set her up for changes to the right leg 3 times a week. She is here for her first application of Apligraf to the left lower extremity. 09/30/2015 -- she has seen Dr. 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 because she is ambulatory at home health will not apply a wound VAC and she will have  to come here 3 times a week on Monday Wednesday and Friday. 10/10/2015 -- she is here for a second application of Apligraf to her left lower extremity. 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 Jefm Bryant. Other than that she is doing well 10/31/2015 -- she is here for a third application of Apligraf to her left lower extremity. after reviewing her wound on the right side I noted that it is granulating extremely well and we will use the remnants of the Apligraf on the right leg. LEXA, MORINE ([id number]) 11/14/2015 -- she was recently admitted to the hospital between January 11 and 11/10/2015 for uncontrolled hypertension, diabetes mellitus, acute kidney injury. during her admission she was supported by wound care and also by antibiotics. Around this time her immunosuppression was stopped by Dr. Nunzio Cory. She is feeling much better now. Electronic Signature(s) Signed: 11/14/2015 11:24:40 AM By: [name], [name] Entered By: [name] on 11/14/2015 11:24:40 AIVREE, BOYD ([id number]) -------------------------------------------------------------------------------- Physical Exam Details Patient Name: MARIGRACE, BEATRICE D. 11/14/2015 10:15 Date of Service: AM Medical Record [id number] Number: Patient Account Number: [id number] [birth year] (70 y.o. Treating RN: [name] Date of Birth/Sex: Female) Other Clinician: Primary Care Physician: [facility], Delana Meyer Treating [name] Referring Physician: Glendon Axe Physician/Extender: Weeks in Treatment: 41 Constitutional . Pulse regular. Respirations normal and unlabored. Afebrile. . Eyes Nonicteric. Reactive to light. Ears, Nose, Mouth, and Throat Lips, teeth, and gums WNL.Marland Kitchen Moist mucosa without lesions. Neck supple and nontender. No palpable supraclavicular or cervical adenopathy. Normal sized without goiter. Respiratory WNL. No retractions.. Lymphatic No  adneopathy. No adenopathy. No adenopathy. Musculoskeletal Adexa without tenderness or enlargement.. Digits and nails w/o clubbing, cyanosis, infection, petechiae, ischemia, or inflammatory conditions.. Integumentary (Hair, Skin) No suspicious lesions. No crepitus or fluctuance. No peri-wound warmth or erythema. No masses.Marland Kitchen Psychiatric Judgement and insight Intact.. No evidence of depression, anxiety, or agitation.. Notes The left lower extremity leg wound is looking excellent and it has shrunken quite a bit and is clean and I will used local dressing with Sorbact to be changed with every second day and use her compression stockings. The right lower extremity posteriorly where we apply the wound VAC is also looking very healthy and we will continue Sorbact under the wound VAC. Medially where she has some necrotic debris we will continue to use Santyl. Electronic Signature(s) Signed: 11/14/2015 11:26:27 AM By: [name], [name] Entered By: [name] on 11/14/2015 11:26:27 SHAKITA, VANBRUNT ([id number]) -------------------------------------------------------------------------------- Physician Orders Details Patient Name: AFUA, BOST 11/14/2015 10:15 Date of Service: AM Medical Record [id number] Number: Patient Account Number: [id number] 1946/01/09 (70 y.o. Treating RN: [name] Date of Birth/Sex: Female) Other Clinician: Primary Care Physician: [facility], [place] Glorya Bartley, Roderick Pee  Referring Physician: Glendon Axe Physician/Extender: Weeks in Treatment: 30 Verbal / Phone Orders: Yes Clinician: [name] Read Back and Verified: Yes Diagnosis Coding Wound Cleansing Wound #2 Left,Distal Lower Leg o Cleanse wound with mild soap and water o May shower with protection. Wound #5 Right,Posterior Lower Leg o Cleanse wound with mild soap and water o May shower with protection. Wound #6 Right,Medial Lower Leg o Cleanse wound with mild soap and water o  May shower with protection. Skin Barriers/Peri-Wound Care Wound #2 Left,Distal Lower Leg o Skin Prep Wound #5 Right,Posterior Lower Leg o Skin Prep Wound #6 Right,Medial Lower Leg o Skin Prep Primary Wound Dressing Wound #2 Left,Distal Lower Leg o Other: - siltec sorbact Wound #6 Right,Medial Lower Leg o Dry Gauze o Santyl Ointment o XtraSorb Dressing Change Frequency Wound #2 Left,Distal Lower Leg o Change Dressing Monday, Wednesday, Friday KAWSAR, BOULETTE ([id number]) Wound #5 Right,Posterior Lower Leg o Change Dressing Monday, Wednesday, Friday Wound #6 Right,Medial Lower Leg o Change Dressing Monday, Wednesday, Friday Follow-up Appointments Wound #2 Left,Distal Lower Leg o Nurse Visit as needed - nurse visit on Monday and Friday o Other: - Wednesday Wound #5 Right,Posterior Lower Leg o Nurse Visit as needed - nurse visit on Monday and Friday o Other: - Wednesday Wound #6 Right,Medial Lower Leg o Nurse Visit as needed - nurse visit on Monday and Friday o Other: - Wednesday Edema Control Wound #2 Left,Distal Lower Leg o Patient to wear own compression stockings Negative Pressure Wound Therapy Wound #5 Right,Posterior Lower Leg o Wound VAC settings at 125/130 mmHg continuous pressure. Use BLACK/GREEN foam to wound cavity. Use WHITE foam to fill any tunnel/s and/or undermining. Change VAC dressing 2 X WEEK. Change canister as indicated when full. Nurse may titrate settings and frequency of dressing changes as clinically indicated. - sorbact as a contact layer under black foam o Number of foam/gauze pieces used in the dressing = - 1 Medications-please add to medication list. Wound #6 Right,Medial Lower Leg o Santyl Enzymatic Ointment Electronic Signature(s) Signed: 11/14/2015 11:57:42 AM By: [name] Signed: 11/14/2015 4:37:54 PM By: [name], [name] Entered By: [name] on 11/14/2015 11:57:42 Freehling, YANEIRY CULLIMORE  ([id number]) -------------------------------------------------------------------------------- Problem List Details Patient Name: [name], [name] 11/14/2015 10:15 Date of Service: AM Medical Record [id number] Number: Patient Account Number: [id number] May 28, 1946 (70 y.o. 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 ([id number]) -------------------------------------------------------------------------------- Progress Note Details Patient Name: CARINA, MEINHOLD D. 11/14/2015 10:15 Date of Service: AM Medical Record [id number] Number: Patient Account Number: [id number] [birth year] (70 y.o. Treating RN: [name] Date of Birth/Sex: Female) Other Clinician: Primary Care Physician: [facility], Delana Meyer Treating [name] Referring Physician: Glendon Axe Physician/Extender: Weeks in Treatment:  41 Subjective Chief Complaint Information obtained from Patient Patient presents to the wound care center for a  consult due non healing wound 70 year old patient comes with a history of having a ulcer on the left lower extremity for the past 4 weeks. she says she's had swelling of both lower extremities for about a year after she started having prednisone. 02/07/2015 -- her vascular appointments obtained were in the first and third week of June. she is able to go to [place] and we will try and get her some earlier appointments. Other than that nothing else has changed in her management. History of Present Illness (HPI) 70 year old patient who is known to have diabetes mellitus type 2, chronic renal insufficiency, coronary artery disease, hypertension, hypercholesterolemia, temporal arteritis and inflammatory arthritiss also has a history of having a hysterectomy and some orthopedic related surgeries. The ulcer on the left lower extremity started off as a blister and then. Got progressively worse. She does not have any fever or chills and has not had any recent surgical intervention for this. Her last hemoglobin A1c was 10.1 in September 2015. She has been recently put on doxycycline by her PCP. She is now also allergic to doxycycline and was this was changed over to Keflex. 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 She does see a cardiologist and also takes a diuretic. 02/07/2015 her arterial and venous duplex studies to be done have dates been given as the first and third week of June. This is at [facility]. We are going to try and get early appointments at [facility]. other than that nothing has changed in her management. 02/14/2015 -- we have been able to get her an appointment in [facility] on May 20 which is much earlier than her previous ones at [place]. She continues with her prednisone and her sugars are in the range of 150-200. 02/21/2015 We were able to get a vascular lab  workup for her today and she is going to be there at 2:00 this afternoon. the swelling of her leg has gone down significantly but she still has some tenderness over the wounds. 02/28/2015 - She has had one of two vascular workups done, and this coming Tuesday has another, at [facility]. She continues to be on steroid medications. She has significant sensitivity in her left lower extremity and has pain suggestive of neuropathic pain and I have asked her to address this with her primary care physician. 03/07/2015 -- The patient saw Dr. Lucky Cowboy for a consultation and he has had her arteries are okay but she has 2 incompetent veins on the left lower extremity and he is going to set her up for surgery. Official report is JITZEL, PROENZA ([id number]) awaited. Addendum: Official reports are now available and on 03/04/2015. She was seen and lower extremity venous duplex exam was done. There was reflux present within the left greater saphenous vein below the knee and also the left small saphenous vein. Arterial duplex showed normal triphasic waveforms throughout the left lower extremity without any significant stenosis. Her ABIs were noncompressible bilaterally but a waveforms were normal and a digital pressures were normal bilaterally consistent with no significant arterial insufficiency. He has recommended endovenous ablation of both the left small saphenous and the left great saphenous vein. This would still be scheduled later. 03/14/2015 -- she has heard back from the vascular office and  has surgery scheduled for sometime in July. Her rheumatologist has decreased her prednisone dosage but she still on it. She has also had cataract surgery in her right eye recently this week. 03/20/2015 - No new complaints today. Pain improved. No fever or chills. Tolerating 2 layer compression. 04/14/2015 -- she was doing very well today she went off on vacation and now her edema has  increased markedly the ulceration is bigger and her diabetes is not under control. 04/21/2015 -- I spoke to her PCP Dr. Candiss Norse and discussed the management which would include being seen by a general surgeon for debridement and taking multiple punch biopsies which would help in establishing the diagnosis of this is a vasculitis. She is agreeable about this and will set her up for the procedure with Dr. Tamala Julian at [facility]. She was seen by the surgeon Dr. Jamal Collin. 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 She is scheduled to have laser ablation done next week.Pt was sent here for possible biopsy to look for vasculitis. Feel it would be better to wait after laser ablation is completed- the ulcer may heal fully and biopsy may not be necessary 04/29/2015 -- she had the venous ablation done by Dr. Lucky Cowboy last Friday and we do not have any notes yet. She is doing fine otherwise. 05/06/2015 --Review of her recent vascular intervention shows that she was seen by Dr. Lucky Cowboy on 04/29/2015. The follow-up duplex which was done showed that both the great saphenous vein and the small saphenous vein remained patent with reflux consistent with an unsuccessful ablation. He has rescheduled her for another the endovenous ablation to be done in about 4 weeks time. 05/13/2015 -- he was seen by her surgeon Dr. Jamal Collin who asked her to continue with conservative therapy and he would speak to Dr. Lucky Cowboy about her management. Dr. Lucky Cowboy is going to schedule her surgery in the middle of August for a repeat endovenous ablation. Her pus culture from last week has grown : [place] her noted her sensitivity report but due to her multiple allergies I had tried clindamycin and she developed a rash with this too. She has  been prescribed and anti-buttocks in the ER and is has it at home and she will let is know what she is going to be taking. 05/20/2015 -- she has developed a small spot on her right lower extremity but besides that it is not a full fledged ulceration. She did not get to see Dr. Lucky Cowboy last week and hopefully she will see him in the near future. 05/27/2015 -she is still awaiting her appointment with Dr.Dew and her vascular procedure is not scheduled until August 19. She will be seeing her PCP tomorrow and I have asked her to convey our discussion so that she is aware that debridement has not been done yet. VINDA, [name] ([id number]) 06/03/2015 -- was seen by her rheumatologist Dr. 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 He is lowering her prednisone to 12-1/2 mg for 1 month and then 10 mg per the next month. I will again make an attempt to speak to her PCP Dr. Candiss Norse and her surgeon Dr. Lucky Cowboy to see if he can organize for a debridement in the OR with multiple biopsies to establish a diagnosis  of vasculitis or pyoderma gangrenosum. 06/17/2015 -- Dr.Dew did her vascular procedure last week and a follow-up venous ultrasound shows good resolution of the veins as per the patient's history. He is to see her back in 2 weeks. 07/07/2015. -- the patient has had a heavy growth of Proteus mirabilis and Enterococcus faecalis. These are sensitive to several drugs but the problem is she has allergies to all of these and hence I would like her to see Dr. Ola Spurr for this. She is also due to see Dr. Lucky Cowboy tomorrow and I will discussed the management with him including debridement under anesthesia and possible biopsies. 07/14/2015 -- she has an appointment to see Dr. Ola Spurr tomorrow and did see Dr. Bunnie Domino PA who will discuss my request with him. 07/21/2015 -- saw Dr. Ola Spurr was able to do a  test on her and has put her on amoxicillin. She has been tolerating that and has had no problems with allergies to this. 07/28/2015 -- Last Friday I spoke to Dr. Leotis Pain regarding her care and he said that her right leg did not need any surgery and on the left leg was doing pretty good. We did agree that if she undergoes any procedure in the future he would do a couple of punch biopsies of the wound. 08/18/2015 -- her right leg is very tender and there is significant amount of slough. The left leg is looking much better 09/02/2015 -- she is going to have a debridement and punch biopsies of her right lower extremity by Dr. Leotis Pain this coming Thursday. Also seen Dr. Ola Spurr who has continued her on ciprofloxacin. 09/09/2015 -- on 09/04/2015 Dr. Leotis Pain took her to surgery - Irrigation and excisional debridement of skin, soft tissue, and muscle to about 40 cm2 to the right posterior calf with biopsy. Pathology results are -- DIAGNOSIS: SKIN, RIGHT LOWER EXTREMITY; BIOPSY: SKIN AND SOFT TISSUE WITH ULCERATION, SEE NOTE. - NEGATIVE FOR DYSPLASIA AND MALIGNANCY. Note: There is acute inflammation and ulceration of the epidermis. The dermis shows nonspecific inflammation, neovascularization, and hemosiderin deposition. The differential diagnosis for these findings includes stasis dermatitis, nonspecific dermatitis, and infection. Correlation with clinical findings is required. A PAS fungal stain is obtained and results will be reported in an addendum. cultures were also sent and this grew Pseudomonas aeruginosa, Escherichia coli, Proteus mirabilis, Klebsiella pneumoniae and they were all sensitive to ciprofloxacin which she is on. 09/16/2015 -- he saw Dr. 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 He is going to call the patient in and discuss with her the management  possibly with Imuran. I will continue treating her locally. 09/23/2015 -- she saw Dr. Ola Spurr today who was going to continue the antibiotics for now and stop after this course. She has an appointment to see Dr. Jefm Bryant in about a week's time. Her wound VAC has arrived but she did not bring it with her today. We will try and set her up for changes to the right leg 3 times a week. She is here for her first application of Apligraf to the left lower extremity. [name], [name] ([id number]) 09/30/2015 -- she has seen Dr. 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 because she is ambulatory at home health will not apply a wound VAC and she will have to come here  3 times a week on Monday Wednesday and Friday. 10/10/2015 -- she is here for a second application of Apligraf to her left lower extremity. 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 Jefm Bryant. Other than that she is doing well 10/31/2015 -- she is here for a third application of Apligraf to her left lower extremity. after reviewing her wound on the right side I noted that it is granulating extremely well and we will use the remnants of the Apligraf on the right leg. 11/14/2015 -- she was recently admitted to the hospital between January 11 and 11/10/2015 for uncontrolled hypertension, diabetes mellitus, acute kidney injury. during her admission she was supported by wound care and also by antibiotics. Around this time her immunosuppression was stopped by Dr. Nunzio Cory. She is feeling much better now. Objective Constitutional Pulse regular. Respirations normal and unlabored. Afebrile. Vitals Time Taken: 10:41 AM, Height: 65 in, Weight: 248 lbs, BMI: 41.3, Temperature: 98.1 F, Pulse: 75 bpm, Respiratory Rate: 18 breaths/min, Blood Pressure: 149/60 mmHg. Eyes Nonicteric. Reactive to  light. Ears, Nose, Mouth, and Throat Lips, teeth, and gums WNL.Marland Kitchen Moist mucosa without lesions. Neck supple and nontender. No palpable supraclavicular or cervical adenopathy. Normal sized without goiter. Respiratory WNL. No retractions.. Lymphatic No adneopathy. No adenopathy. No adenopathy. Musculoskeletal Adexa without tenderness or enlargement.. Digits and nails w/o clubbing, cyanosis, infection, petechiae, ischemia, or inflammatory conditions.Marland Kitchen Psychiatric Jupin, EMELYN NAKANISHI. ([id number]) Judgement and insight Intact.. No evidence of depression, anxiety, or agitation.. General Notes: The left lower extremity leg wound is looking excellent and it has shrunken quite a bit and is clean and I will used local dressing with Sorbact to be changed with every second day and use her compression stockings. The right lower extremity posteriorly where we apply the wound VAC is also looking very healthy and we will continue Sorbact under the wound VAC. Medially where she has some necrotic debris we will continue to use Santyl. Integumentary (Hair, Skin) No suspicious lesions. No crepitus or fluctuance. No peri-wound warmth or erythema. No masses.. Wound #2 status is Open. Original cause of wound was Blister. The wound is located on the Left,Distal Lower Leg. The wound measures 1.8cm length x 1.5cm width x 0.1cm depth; 2.121cm^2 area and 0.212cm^3 volume. The wound is limited to skin breakdown. There is no tunneling or undermining noted. There is a large amount of serous drainage noted. The wound margin is thickened. There is medium (34- 66%) red, pink granulation within the wound bed. There is a medium (34-66%) amount of necrotic tissue within the wound bed including Adherent Slough. The periwound skin appearance exhibited: Localized Edema, Scarring, Maceration, Moist. Periwound temperature was noted as No Abnormality. The periwound has tenderness on palpation. Wound #5 status is Open. Original cause  of wound was Gradually Appeared. The wound is located on the Right,Posterior Lower Leg. The wound measures 5.5cm length x 10.4cm width x 0.4cm depth; 44.925cm^2 area and 17.97cm^3 volume. There is no tunneling or undermining noted. There is a large amount of serosanguineous drainage noted. The wound margin is thickened. There is large (67-100%) red, pink, friable granulation within the wound bed. There is a small (1-33%) amount of necrotic tissue within the wound bed including Adherent Slough. The periwound skin appearance exhibited: Localized Edema, Scarring, Moist. Periwound temperature was noted as No Abnormality. The periwound has tenderness on palpation. Wound #6 status is Open. Original cause of wound was Gradually Appeared. The wound is located on the Right,Medial Lower  Leg. The wound measures 5.3cm length x 4.5cm width x 0.1cm depth; 18.732cm^2 area and 1.873cm^3 volume. The wound is limited to skin breakdown. There is no tunneling or undermining noted. There is a medium amount of purulent drainage noted. The wound margin is distinct with the outline attached to the wound base. There is no granulation within the wound bed. There is a large (67-100%) amount of necrotic tissue within the wound bed including Eschar and Adherent Slough. The periwound skin appearance exhibited: Moist. The periwound skin appearance did not exhibit: Callus, Crepitus, Excoriation, Fluctuance, Friable, Induration, Localized Edema, Rash, Scarring, Dry/Scaly, Maceration, Atrophie Blanche, Cyanosis, Ecchymosis, Hemosiderin Staining, Mottled, Pallor, Rubor, Erythema. Periwound temperature was noted as No Abnormality. The periwound has tenderness on palpation. Assessment Active Problems ICD-10 E11.622 - Type 2 diabetes mellitus with other skin ulcer Dykema, Ronneisha D. 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 Wound #5 Right,Posterior Lower Leg: Cleanse wound with mild soap and water May shower with protection. Wound #6 Right,Medial Lower Leg: Cleanse wound with mild soap and water May shower with protection. Skin Barriers/Peri-Wound Care: Wound #2 Left,Distal Lower Leg: Skin Prep Wound #5 Right,Posterior Lower Leg: Skin Prep Wound #6 Right,Medial Lower Leg: Skin Prep Primary Wound Dressing: Wound #2 Left,Distal Lower Leg: Other: - siltec sorbact Wound #6 Right,Medial Lower Leg: Dry Gauze Santyl Ointment XtraSorb Dressing Change Frequency: Wound #2 Left,Distal Lower Leg: Change Dressing Monday, Wednesday, Friday Wound #5 Right,Posterior Lower Leg: Change Dressing Monday, Wednesday, Friday Wound #6 Right,Medial Lower Leg: Change Dressing Monday, Wednesday, Friday Follow-up Appointments: Wound #2 Left,Distal Lower Leg: Nurse Visit as needed - nurse visit on Monday and Friday Other: - Wednesday Wound #5 Right,Posterior Lower Leg: CORI, ALEXIE ([id number]) Nurse Visit as needed - nurse visit on Monday and Friday Other: - Wednesday Wound #6 Right,Medial Lower Leg: Nurse Visit as needed - nurse visit on Monday and Friday Other: - Wednesday Edema Control: Wound #2 Left,Distal Lower Leg: Patient to wear own compression stockings Negative Pressure Wound Therapy: Wound #5 Right,Posterior Lower Leg: Wound VAC settings at 125/130 mmHg continuous pressure. Use BLACK/GREEN foam to wound cavity. Use WHITE foam to fill any tunnel/s and/or undermining. Change VAC dressing 2 X WEEK. Change canister as indicated  when full. Nurse may titrate settings and frequency of dressing changes as clinically indicated. - sorbact as a contact layer under black foam Number of foam/gauze pieces used in the dressing = - 1 Medications-please add to medication list.: Wound #6 Right,Medial Lower Leg: Santyl Enzymatic Ointment The left lower extremity leg wound is looking excellent and it has shrunken quite a bit and is clean and I will used local dressing with Sorbact to be changed with every second day and use her compression stockings. The right lower extremity posteriorly where we apply the wound VAC is also looking very healthy and we will continue Sorbact under the wound VAC. Medially where she has some necrotic debris we will continue to use Santyl. We will bring her in next week for her fourth application of Apligraf. Electronic Signature(s) Signed: 11/14/2015  4:41:26 PM By: [name], [name] Previous Signature: 11/14/2015 4:41:02 PM Version By: [name], [name] Previous Signature: 11/14/2015 11:26:50 AM Version By: [name], [name] Entered By: [name] on 11/14/2015 16:41:26 Tracz, Adisa D. ([id number]) -------------------------------------------------------------------------------- SuperBill Details Patient Name: FABLE, [name] Date of Service: 11/14/2015 Medical Record Number: [id number] Patient Account Number: [id number] Date of Birth/Sex: [birth year] (69 y.o. 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 L03.116 Cellulitis of left lower limb Facility Procedures CPT4 Code: [id number] Description: [id number] NEG PRESS WND TX <=50 SQ CM Modifier: Quantity: 1 Physician Procedures CPT4: Description Modifier Quantity Code [id number] - WC PHYS LEVEL 3 - EST PT 1 ICD-10 Description Diagnosis E11.622 Type 2 diabetes mellitus with other skin ulcer L97.322 Non-pressure chronic ulcer of left ankle with fat layer exposed I89.0  Lymphedema, not elsewhere classified I83.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

## 2015-11-15 NOTE — Progress Notes (Signed)
REVAN, STROJNY ([id number]) Visit Report for 11/14/2015 Arrival Information Details Patient Name: [name], [name] Date of Service: 11/14/2015 10:15 AM Medical Record Number: [id number] Patient Account Number: [id number] Date of Birth/Sex: 01/02/1946 (70 y.o. Female) Treating RN: [name] Primary Care Physician: Glendon Axe Other Clinician: Referring Physician: Glendon Axe Treating Physician/Extender: Frann Rider in Treatment: 3 Visit Information History Since Last Visit Added or deleted any medications: No Patient Arrived: Cane Any new allergies or adverse reactions: No Arrival Time: 10:40 Had a fall or experienced change in No Accompanied By: self activities of daily living that may affect Transfer Assistance: None risk of falls: Patient Identification Verified: Yes Signs or symptoms of abuse/neglect since last No Secondary Verification Process Yes visito Completed: Hospitalized since last visit: No Patient Requires Transmission- No Pain Present Now: No Based Precautions: Patient Has Alerts: Yes Patient Alerts: Patient on Blood Thinner Electronic Signature(s) Signed: 11/14/2015 6:12:53 PM By: [name] Entered By: [name] on 11/14/2015 10:40:27 [name], [name] ([id number]) -------------------------------------------------------------------------------- Encounter Discharge Information Details Patient Name: [name], [name] Date of Service: 11/14/2015 10:15 AM Medical Record Number: [id number] Patient Account Number: [id number] Date of Birth/Sex: [birth year] (70 y.o. Female) Treating RN: [name] Primary Care Physician: Glendon Axe Other Clinician: Referring Physician: Glendon Axe Treating Physician/Extender: Frann Rider in Treatment: 43 Encounter Discharge Information Items Discharge Pain Level: 0 Discharge Condition: Stable Ambulatory Status: Cane Discharge Destination: Home Transportation: Private Auto Accompanied  By: self Schedule Follow-up Appointment: Yes Medication Reconciliation completed No and provided to Patient/Care Jasara Corrigan: Provided on Clinical Summary of Care: 11/14/2015 Form Type Recipient Paper Patient LM Electronic Signature(s) Signed: 11/14/2015 12:00:59 PM By: [name] Previous Signature: 11/14/2015 11:43:12 AM Version By: Ruthine Dose Entered By: [name] on 11/14/2015 12:00:59 [name], [name] ([id number]) -------------------------------------------------------------------------------- Lower Extremity Assessment Details Patient Name: Kristin Stare D. Date of Service: 11/14/2015 10:15 AM Medical Record Number: [id number] Patient Account Number: [id number] Date of Birth/Sex: 03-21-1946 (70 y.o. Female) Treating RN: [name] Primary Care Physician: Glendon Axe Other Clinician: Referring Physician: Glendon Axe Treating Physician/Extender: Frann Rider in Treatment: 41 Vascular Assessment Pulses: Posterior Tibial Dorsalis Pedis Palpable: [Left:Yes] [Right:Yes] Extremity colors, hair growth, and conditions: Extremity Color: [Left:Hyperpigmented] [Right:Hyperpigmented] Hair Growth on Extremity: [Left:No] [Right:No] Temperature of Extremity: [Left:Warm] [Right:Warm] Capillary Refill: [Left:< 3 seconds] [Right:< 3 seconds] Electronic Signature(s) Signed: 11/14/2015 6:12:53 PM By: [name] Entered By: [name] on 11/14/2015 10:51:31 [name], [name] ([id number]) -------------------------------------------------------------------------------- Multi Wound Chart Details Patient Name: Kristin Stare D. Date of Service: 11/14/2015 10:15 AM Medical Record Number: [id number] Patient Account Number: [id number] Date of Birth/Sex: 04-08-1946 (70 y.o. Female) Treating RN: [name] Primary Care Physician: Glendon Axe Other Clinician: Referring Physician: Glendon Axe Treating Physician/Extender: Frann Rider in Treatment: 41 Vital  Signs Height(in): 65 Pulse(bpm): 75 Weight(lbs): 248 Blood Pressure 149/60 (mmHg): Body Mass Index(BMI): 41 Temperature(F): 98.1 Respiratory Rate 18 (breaths/min): Photos: [2:No Photos] [5:No Photos] [6:No Photos] Wound Location: [2:Left Lower Leg - Distal] [5:Right Lower Leg - Posterior] [6:Right Lower Leg - Medial] Wounding Event: [2:Blister] [5:Gradually Appeared] [6:Gradually Appeared] Primary Etiology: [2:Venous Leg Ulcer] [5:Venous Leg Ulcer] [6:Diabetic Wound/Ulcer of the Lower Extremity] Comorbid History: [2:Cataracts, Asthma, Coronary Artery Disease, Coronary Artery Disease, Coronary Artery Disease, Hypertension, Type II Diabetes, Osteoarthritis, Diabetes, Osteoarthritis, Diabetes, Osteoarthritis, Neuropathy] [5:Cataracts, Asthma,  Hypertension, Type II Neuropathy] [6:Cataracts, Asthma, Hypertension, Type II Neuropathy] Date Acquired: [2:12/30/2014] [5:04/29/2015] [6:10/10/2015] Weeks of Treatment: [2:41] [5:25] [6:5] Wound Status: [2:Open] [5:Open] [6:Open] Clustered Wound: [2:Yes] [5:No] [6:No] Measurements L x  W x D 1.8x1.5x0.1 [5:5.5x10.4x0.4] [6:5.3x4.5x0.1] (cm) Area (cm) : [2:2.121] [5:44.925] [6:18.732] Volume (cm) : [2:0.212] [5:17.97] [6:1.873] % Reduction in Area: [2:-200.00%] [5:-23796.30%] [6:-442.00%] % Reduction in Volume: -198.60% [5:-94478.90%] [6:-441.30%] Classification: [2:Full Thickness Without Exposed Support Structures] [5:Full Thickness Without Exposed Support Structures] [6:Grade 1] HBO Classification: [2:Grade 2] [5:Grade 2] [6:N/A] Exudate Amount: [2:Large] [5:Large] [6:Medium] Exudate Type: [2:Serous] [5:Serosanguineous] [6:Purulent] Exudate Color: [2:amber] [5:red, brown] [6:yellow, brown, green] Wound Margin: [2:Thickened] [5:Thickened] [6:Distinct, outline attached] Granulation Amount: [2:Medium (34-66%)] [5:Large (67-100%)] [6:None Present (0%)] Granulation Quality: Red, Pink Red, Pink, Hyper- N/A granulation, Friable Necrotic Amount:  Medium (34-66%) Small (1-33%) Large (67-100%) Necrotic Tissue: Adherent Mackey, Adherent Slough Exposed Structures: Fascia: No N/A Fascia: No Fat: No Fat: No Tendon: No Tendon: No Muscle: No Muscle: No Joint: No Joint: No Bone: No Bone: No Limited to Skin Limited to Skin Breakdown Breakdown Epithelialization: None None None Periwound Skin Texture: Edema: Yes Edema: Yes Edema: No Scarring: Yes Scarring: Yes Excoriation: No Induration: No Callus: No Crepitus: No Fluctuance: No Friable: No Rash: No Scarring: No Periwound Skin Maceration: Yes Moist: Yes Moist: Yes Moisture: Moist: Yes Maceration: No Dry/Scaly: No Periwound Skin Color: No Abnormalities Noted No Abnormalities Noted Atrophie Blanche: No Cyanosis: No Ecchymosis: No Erythema: No Hemosiderin Staining: No Mottled: No Pallor: No Rubor: No Temperature: No Abnormality No Abnormality No Abnormality Tenderness on Yes Yes Yes Palpation: Wound Preparation: Ulcer Cleansing: Ulcer Cleansing: Ulcer Cleansing: Rinsed/Irrigated with Rinsed/Irrigated with Rinsed/Irrigated with Saline Saline Saline Topical Anesthetic Topical Anesthetic Topical Anesthetic Applied: Other: lidocaine Applied: Other: lidocaine Applied: Other: lidocaine 4% 4% 4% Treatment Notes Electronic Signature(s) Signed: 11/14/2015 6:12:53 PM By: [name] Entered By: [name] on 11/14/2015 11:05:34 [name], [name] ([id number]) [name], [place]. ([id number]) -------------------------------------------------------------------------------- [place] Details Patient Name: CAMESHIA, TETTER D. Date of Service: 11/14/2015 10:15 AM Medical Record Number: [id number] Patient Account Number: [id number] Date of Birth/Sex: 05/11/1946 (70 y.o. Female) Treating RN: [name] Primary Care Physician: Glendon Axe Other Clinician: Referring Physician: Glendon Axe Treating Physician/Extender: Frann Rider in Treatment: 67 Active Inactive Orientation to the Wound Care Program Nursing Diagnoses: Knowledge deficit related to the wound healing center program Goals: Patient/caregiver will verbalize understanding of the [facility] Date Initiated: 01/31/2015 Goal Status: Active Interventions: Provide education on orientation to the wound center Notes: Venous Leg Ulcer Nursing Diagnoses: Potential for venous Insuffiency (use before diagnosis confirmed) Goals: Non-invasive venous studies are completed as ordered Date Initiated: 01/31/2015 Goal Status: Active Patient/caregiver will verbalize understanding of disease process and disease management Date Initiated: 01/31/2015 Goal Status: Active Interventions: Assess peripheral edema status every visit. Notes: Wound/Skin Impairment Nursing Diagnoses: Impaired tissue integrity Knowledge deficit related to smoking impact on wound healing [name], [name] ([id number]) Goals: Patient/caregiver will verbalize understanding of skin care regimen Date Initiated: 01/31/2015 Goal Status: Active Ulcer/skin breakdown will heal within 14 weeks Date Initiated: 01/31/2015 Goal Status: Active Interventions: Assess ulceration(s) every visit Notes: Electronic Signature(s) Signed: 11/14/2015 6:12:53 PM By: [name] Entered By: [name] on 11/14/2015 11:05:22 [name], [name] ([id number]) -------------------------------------------------------------------------------- Patient/Caregiver Education Details Patient Name: Kristin Stare D. Date of Service: 11/14/2015 10:15 AM Medical Record Number: [id number] Patient Account Number: [id number] Date of Birth/Gender: Sep 02, 1946 (70 y.o. Female) Treating RN: [name] Primary Care Physician: Glendon Axe Other Clinician: Referring Physician: Glendon Axe Treating Physician/Extender: Frann Rider in Treatment: 93 Education Assessment Education Provided  To: Patient Education Topics Provided Venous: Handouts: Other: wear compression hose this week Methods: Explain/Verbal  Responses: State content correctly Electronic Signature(s) Signed: 11/14/2015 12:01:24 PM By: [name] Entered By: [name] on 11/14/2015 12:01:24 [name], [name] ([id number]) -------------------------------------------------------------------------------- Wound Assessment Details Patient Name: Kiesler, Manal D. Date of Service: 11/14/2015 10:15 AM Medical Record Number: [id number] Patient Account Number: [id number] Date of Birth/Sex: 08-18-1946 (70 y.o. Female) Treating RN: [name] Primary Care Physician: Glendon Axe Other Clinician: Referring Physician: Glendon Axe Treating Physician/Extender: Frann Rider in Treatment: 41 Wound Status Wound Number: 2 Primary Venous Leg Ulcer Etiology: Wound Location: Left Lower Leg - Distal Wound Open Wounding Event: Blister Status: Date Acquired: 12/30/2014 Comorbid Cataracts, Asthma, Coronary Artery Weeks Of Treatment: 41 History: Disease, Hypertension, Type II Clustered Wound: Yes Diabetes, Osteoarthritis, Neuropathy Photos Photo Uploaded By: [name] on 11/14/2015 17:13:00 Wound Measurements Length: (cm) 1.8 Width: (cm) 1.5 Depth: (cm) 0.1 Area: (cm) 2.121 Volume: (cm) 0.212 % Reduction in Area: -200% % Reduction in Volume: -198.6% Epithelialization: None Tunneling: No Undermining: No Wound Description Full Thickness Without Classification: Exposed Support Structures Diabetic Severity Grade 2 (Wagner): Wound Margin: Thickened Exudate Amount: Large Exudate Type: Serous Exudate Color: amber Foul Odor After Cleansing: No Wound Bed Granulation Amount: Medium (34-66%) Exposed Structure Jakubowicz, Assyria D. ([id number]) Granulation Quality: Red, Pink Fascia Exposed: No Necrotic Amount: Medium (34-66%) Fat Layer Exposed: No Necrotic Quality: Adherent Slough Tendon  Exposed: No Muscle Exposed: No Joint Exposed: No Bone Exposed: No Limited to Skin Breakdown Periwound Skin Texture Texture Color No Abnormalities Noted: No No Abnormalities Noted: No Localized Edema: Yes Temperature / Pain Scarring: Yes Temperature: No Abnormality Moisture Tenderness on Palpation: Yes No Abnormalities Noted: No Maceration: Yes Moist: Yes Wound Preparation Ulcer Cleansing: Rinsed/Irrigated with Saline Topical Anesthetic Applied: Other: lidocaine 4%, Treatment Notes Wound #2 (Left, Distal Lower Leg) 1. Cleansed with: Clean wound with Normal Saline 2. Anesthetic 2% Lidocaine injectible with epinephrine prior to debridement 4. Dressing Applied: Other dressing (specify in notes) 7. Secured with Patient to wear own compression stockings Notes siltec sorbact Electronic Signature(s) Signed: 11/14/2015 6:12:53 PM By: [name] Entered By: [name] on 11/14/2015 11:00:22 [name], Reyna D. ([id number]) -------------------------------------------------------------------------------- Wound Assessment Details Patient Name: Salamone, Jacqui D. Date of Service: 11/14/2015 10:15 AM Medical Record Number: [id number] Patient Account Number: [id number] Date of Birth/Sex: [birth year] (70 y.o. Female) Treating RN: [name] Primary Care Physician: Glendon Axe Other Clinician: Referring Physician: Glendon Axe Treating Physician/Extender: Frann Rider in Treatment: 41 Wound Status Wound Number: 5 Primary Venous Leg Ulcer Etiology: Wound Location: Right Lower Leg - Posterior Wound Open Wounding Event: Gradually Appeared Status: Date Acquired: 04/29/2015 Comorbid Cataracts, Asthma, Coronary Artery Weeks Of Treatment: 25 History: Disease, Hypertension, Type II Clustered Wound: No Diabetes, Osteoarthritis, Neuropathy Photos Photo Uploaded By: [name] on 11/14/2015 17:13:25 Wound Measurements Length: (cm) 5.5 Width: (cm) 10.4 Depth: (cm)  0.4 Area: (cm) 44.925 Volume: (cm) 17.97 % Reduction in Area: -23796.3% % Reduction in Volume: -94478.9% Epithelialization: None Tunneling: No Undermining: No Wound Description Full Thickness Without Exposed Foul Odor A Classification: Support Structures Diabetic Severity Grade 2 (Wagner): Wound Margin: Thickened Exudate Amount: Large Exudate Type: Serosanguineous Exudate Color: red, brown fter Cleansing: No Wound Bed Large (67-100%) Xiang, Anelly D. ([id number]) Granulation Amount: Granulation Red, Pink, Hyper-granulation, Quality: Friable Necrotic Amount: Small (1-33%) Necrotic Quality: Adherent Slough Periwound Skin Texture Texture Color No Abnormalities Noted: No No Abnormalities Noted: No Localized Edema: Yes Temperature / Pain Scarring: Yes Temperature: No Abnormality Moisture Tenderness on Palpation: Yes No Abnormalities Noted: No Moist: Yes Wound Preparation Ulcer Cleansing: Rinsed/Irrigated  with Saline Topical Anesthetic Applied: Other: lidocaine 4%, Treatment Notes Wound #5 (Right, Posterior Lower Leg) 1. Cleansed with: Clean wound with Normal Saline 4. Dressing Applied: Other dressing (specify in notes) 8. Negative Pressure Wound Therapy Wound Vac intermittently at 169mm/hg pressure Black Foam Apply contact layer over base of wound. Notes sorbact applied as contact layer, 1 black foam used Electronic Signature(s) Signed: 11/14/2015 6:12:53 PM By: [name] Entered By: [name] on 11/14/2015 11:00:00 Cassarino, Mauria D. ([id number]) -------------------------------------------------------------------------------- Wound Assessment Details Patient Name: Heslin, Shakeita D. Date of Service: 11/14/2015 10:15 AM Medical Record Number: [id number] Patient Account Number: [id number] Date of Birth/Sex: 1946-03-09 (70 y.o. Female) Treating RN: [name] Primary Care Physician: Glendon Axe Other Clinician: Referring Physician: Glendon Axe Treating Physician/Extender: Frann Rider in Treatment: 41 Wound Status Wound Number: 6 Primary Diabetic Wound/Ulcer of the Lower Etiology: Extremity Wound Location: Right Lower Leg - Medial Wound Open Wounding Event: Gradually Appeared Status: Date Acquired: 10/10/2015 Comorbid Cataracts, Asthma, Coronary Artery Weeks Of Treatment: 5 History: Disease, Hypertension, Type II Clustered Wound: No Diabetes, Osteoarthritis, Neuropathy Photos Photo Uploaded By: [name] on 11/14/2015 17:13:25 Wound Measurements Length: (cm) 5.3 Width: (cm) 4.5 Depth: (cm) 0.1 Area: (cm) 18.732 Volume: (cm) 1.873 % Reduction in Area: -442% % Reduction in Volume: -441.3% Epithelialization: None Tunneling: No Undermining: No Wound Description Classification: Grade 1 Wound Margin: Distinct, outline attached Exudate Amount: Medium Exudate Type: Purulent Exudate Color: yellow, brown, green Foul Odor After Cleansing: No Wound Bed Granulation Amount: None Present (0%) Exposed Structure Necrotic Amount: Large (67-100%) Fascia Exposed: No Necrotic Quality: Eschar, Adherent Slough Fat Layer Exposed: No Tendon Exposed: No Finigan, Emalene D. ([id number]) Muscle Exposed: No Joint Exposed: No Bone Exposed: No Limited to Skin Breakdown Periwound Skin Texture Texture Color No Abnormalities Noted: No No Abnormalities Noted: No Callus: No Atrophie Blanche: No Crepitus: No Cyanosis: No Excoriation: No Ecchymosis: No Fluctuance: No Erythema: No Friable: No Hemosiderin Staining: No Induration: No Mottled: No Localized Edema: No Pallor: No Rash: No Rubor: No Scarring: No Temperature / Pain Moisture Temperature: No Abnormality No Abnormalities Noted: No Tenderness on Palpation: Yes Dry / Scaly: No Maceration: No Moist: Yes Wound Preparation Ulcer Cleansing: Rinsed/Irrigated with Saline Topical Anesthetic Applied: Other: lidocaine 4%, Treatment Notes Wound #6  (Right, Medial Lower Leg) 1. Cleansed with: Clean wound with Normal Saline 4. Dressing Applied: Santyl Ointment Other dressing (specify in notes) 5. Secondary Dressing Applied Dry Gauze Notes xtrasorb and covered with NPWT drape Electronic Signature(s) Signed: 11/14/2015 6:12:53 PM By: [name] Entered By: [name] on 11/14/2015 10:59:45 Toso, Karson D. ([id number]) -------------------------------------------------------------------------------- Vitals Details Patient Name: Kristin Stare D. Date of Service: 11/14/2015 10:15 AM Medical Record Number: [id number] Patient Account Number: [id number] Date of Birth/Sex: 14-Dec-1945 (70 y.o. Female) Treating RN: [name] Primary Care Physician: Glendon Axe Other Clinician: Referring Physician: Glendon Axe Treating Physician/Extender: Frann Rider in Treatment: 41 Vital Signs Time Taken: 10:41 Temperature (F): 98.1 Height (in): 65 Pulse (bpm): 75 Weight (lbs): 248 Respiratory Rate (breaths/min): 18 Body Mass Index (BMI): 41.3 Blood Pressure (mmHg): 149/60 Reference Range: 80 - 120 mg / dl Electronic Signature(s) Signed: 11/14/2015 6:12:53 PM By: [name] Entered By: [name] on 11/14/2015 10:43:07

## 2015-11-17 DIAGNOSIS — E11622 Type 2 diabetes mellitus with other skin ulcer: Secondary | ICD-10-CM | POA: Diagnosis not present

## 2015-11-18 NOTE — Progress Notes (Signed)
SARAHBETH, ORLOSKY ([id number]) Visit Report for 11/17/2015 Arrival Information Details Patient Name: EMALYNE, BRECEDA. Date of Service: 11/17/2015 2:45 PM Medical Record Patient Account Number: [id number] Number: [name], RN, [name], Treating RN: May 02, 1946 (70 y.o. [name] Date of Birth/Sex: Female) Other Clinician: Primary Care Physician: [facility], Delana Meyer Treating Britto, Errol Referring Physician: Glendon Axe Physician/Extender: Weeks in Treatment: 28 Visit Information History Since Last Visit Added or deleted any medications: No Patient Arrived: Ambulatory Any new allergies or adverse reactions: No Arrival Time: 15:00 Had a fall or experienced change in No Accompanied By: self activities of daily living that may affect Transfer Assistance: None risk of falls: Patient Identification Verified: Yes Signs or symptoms of abuse/neglect since last No Secondary Verification Process Yes visito Completed: Hospitalized since last visit: No Patient Requires Transmission- No Has Dressing in Place as Prescribed: Yes Based Precautions: Pain Present Now: No Patient Has Alerts: Yes Patient Alerts: Patient on Blood Thinner Electronic Signature(s) Signed: 11/18/2015 11:26:13 AM By: Regan Lemming [name], RN Entered By: Regan Lemming on 11/18/2015 11:26:13 Ciriello, Arneshia D. ([id number]) -------------------------------------------------------------------------------- Encounter Discharge Information Details Patient Name: OLUKEMI, CLOTFELTER D. Date of Service: 11/17/2015 2:45 PM Medical Record Patient Account Number: [id number] Number: [name], RN, [name], Treating RN: October 01, 1946 (70 y.o. [name] Date of Birth/Sex: Female) Other Clinician: Primary Care Physician: [facility], Delana Meyer Treating [name] Referring Physician: [place], [place]: Weeks in Treatment: 50 Encounter Discharge Information Items Discharge Pain Level: 0 Discharge Condition: Stable Ambulatory Status:  Ambulatory Discharge Destination: Home Private Transportation: Auto Accompanied By: self Schedule Follow-up Appointment: No Medication Reconciliation completed and No provided to Patient/Care Olander Friedl: Clinical Summary of Care: Electronic Signature(s) Signed: 11/18/2015 11:27:39 AM By: Regan Lemming [name], RN Entered By: Regan Lemming on 11/18/2015 11:27:39 Vanwart, Tia Masker ([id number]) -------------------------------------------------------------------------------- Patient/Caregiver Education Details Patient Name: [name] Date of Service: 11/17/2015 2:45 PM Medical Record Patient Account Number: [id number] Number: [name], RN, [name], Treating RN: 06-20-1946 (70 y.o. [name] Date of Birth/Gender: Female) Other Clinician: Primary Care Physician: [facility], Delana Meyer Treating [name] Referring Physician: Meno, [place]: [place] in Treatment: 34 Education Assessment Education Provided To: Patient Education Topics Provided [facility]: Methods: Explain/Verbal Responses: State content correctly Electronic Signature(s) Signed: 11/18/2015 11:27:21 AM By: Regan Lemming [name], RN Entered By: Regan Lemming on 11/18/2015 11:27:21

## 2015-11-19 ENCOUNTER — Encounter: Payer: Commercial Managed Care - HMO | Admitting: Surgery

## 2015-11-19 DIAGNOSIS — E11622 Type 2 diabetes mellitus with other skin ulcer: Secondary | ICD-10-CM | POA: Diagnosis not present

## 2015-11-20 NOTE — Progress Notes (Signed)
JALYIAH, PURYEAR ([id number]) Visit Report for 11/19/2015 Arrival Information Details Patient Name: [name], [name] Date of Service: 11/19/2015 12:45 PM Medical Record Number: [id number] Patient Account Number: [id number] Date of Birth/Sex: 10-17-1946 (70 y.o. Female) Treating RN: [name] Primary Care Physician: Glendon Axe Other Clinician: Referring Physician: Glendon Axe Treating Physician/Extender: Frann Rider in Treatment: 72 Visit Information History Since Last Visit Added or deleted any medications: No Patient Arrived: Cane Any new allergies or adverse reactions: No Arrival Time: 12:50 Had [name] fall or experienced change in No Accompanied By: self activities of daily living that may affect Transfer Assistance: None risk of falls: Patient Identification Verified: Yes Signs or symptoms of abuse/neglect since last No Secondary Verification Process Yes visito Completed: Hospitalized since last visit: No Patient Requires Transmission- No Pain Present Now: No Based Precautions: Patient Has Alerts: Yes Patient Alerts: Patient on Blood Thinner Electronic Signature(s) Signed: 11/19/2015 5:54:58 PM By: [name] Entered By: [name] on 11/19/2015 12:59:36 [name], [name] ([id number]) -------------------------------------------------------------------------------- Encounter Discharge Information Details Patient Name: [name], [name] Date of Service: 11/19/2015 12:45 PM Medical Record Number: [id number] Patient Account Number: [id number] Date of Birth/Sex: [birth year] (70 y.o. Female) Treating RN: [name] Primary Care Physician: Glendon Axe Other Clinician: Referring Physician: Glendon Axe Treating Physician/Extender: Frann Rider in Treatment: 71 Encounter Discharge Information Items Discharge Pain Level: 0 Discharge Condition: Stable Ambulatory Status: Ambulatory Discharge Destination: Home Transportation: Private  Auto Accompanied By: self Schedule Follow-up Appointment: Yes Medication Reconciliation completed and provided to Patient/Care No Laretta Pyatt: Provided on Clinical Summary of Care: 11/19/2015 Form Type Recipient Paper Patient LM Electronic Signature(s) Signed: 11/19/2015 3:17:11 PM By: [name] Previous Signature: 11/19/2015 2:23:00 PM Version By: Ruthine Dose Entered By: [name] on 11/19/2015 15:17:11 [name], [name] ([id number]) -------------------------------------------------------------------------------- Lower Extremity Assessment Details Patient Name: Kristin Stare D. Date of Service: 11/19/2015 12:45 PM Medical Record Number: [id number] Patient Account Number: [id number] Date of Birth/Sex: 01-14-1946 (70 y.o. Female) Treating RN: [name] Primary Care Physician: Glendon Axe Other Clinician: Referring Physician: Glendon Axe Treating Physician/Extender: Frann Rider in Treatment: 41 Edema Assessment Assessed: [Left: No] [Right: No] Edema: [Left: Yes] [Right: Yes] Vascular Assessment Pulses: Posterior Tibial Dorsalis Pedis Palpable: [Left:Yes] [Right:Yes] Extremity colors, hair growth, and conditions: Extremity Color: [Left:Hyperpigmented] [Right:Hyperpigmented] Hair Growth on Extremity: [Left:No] [Right:No] Temperature of Extremity: [Left:Warm] [Right:Warm] Capillary Refill: [Left:< 3 seconds] [Right:< 3 seconds] Electronic Signature(s) Signed: 11/19/2015 5:54:58 PM By: [name] Entered By: [name] on 11/19/2015 13:24:23 [name], [name] ([id number]) -------------------------------------------------------------------------------- Multi Wound Chart Details Patient Name: Kristin Stare D. Date of Service: 11/19/2015 12:45 PM Medical Record Number: [id number] Patient Account Number: [id number] Date of Birth/Sex: 1946/02/12 (70 y.o. Female) Treating RN: [name] Primary Care Physician: Glendon Axe Other Clinician: Referring  Physician: Glendon Axe Treating Physician/Extender: Frann Rider in Treatment: 41 Vital Signs Height(in): 65 Pulse(bpm): 88 Weight(lbs): 248 Blood Pressure 136/52 (mmHg): Body Mass Index(BMI): 41 Temperature(F): 98.2 Respiratory Rate 18 (breaths/min): Photos: [2:No Photos] [5:No Photos] [6:No Photos] Wound Location: [2:Left Lower Leg - Distal] [5:Right Lower Leg - Posterior] [6:Right Lower Leg - Medial] Wounding Event: [2:Blister] [5:Gradually Appeared] [6:Gradually Appeared] Primary Etiology: [2:Venous Leg Ulcer] [5:Venous Leg Ulcer] [6:Diabetic Wound/Ulcer of the Lower Extremity] Comorbid History: [2:Cataracts, Asthma, Coronary Artery Disease, Coronary Artery Disease, Coronary Artery Disease, Hypertension, Type II Diabetes, Osteoarthritis, Diabetes, Osteoarthritis, Diabetes, Osteoarthritis, Neuropathy] [5:Cataracts, Asthma,  Hypertension, Type II Neuropathy] [6:Cataracts, Asthma, Hypertension, Type II Neuropathy] Date Acquired: [2:12/30/2014] [5:04/29/2015] [6:10/10/2015] Weeks of Treatment: [2:41] [5:26] [6:5] Wound  Status: [2:Open] [5:Open] [6:Open] Clustered Wound: [2:Yes] [5:No] [6:No] Measurements L x W x D 1.5x1.3x0.1 [5:5.4x8.3x0.3] [6:6x4.3x0.3] (cm) Area (cm) : [2:1.532] [5:35.202] [6:20.263] Volume (cm) : [2:0.153] [5:10.56] [6:6.079] % Reduction in Area: [2:-116.70%] [5:-18624.50%] [6:-486.30%] % Reduction in Volume: -115.50% [5:-55478.90%] [6:-1656.90%] Classification: [2:Full Thickness Without Exposed Support Structures] [5:Full Thickness Without Exposed Support Structures] [6:Grade 1] HBO Classification: [2:Grade 2] [5:Grade 2] [6:N/[name]] Exudate Amount: [2:Large] [5:Large] [6:Medium] Exudate Type: [2:Serous] [5:Serosanguineous] [6:Purulent] Exudate Color: [2:amber] [5:red, brown] [6:yellow, brown, green] Wound Margin: [2:Thickened] [5:Thickened] [6:Distinct, outline attached] Granulation Amount: [2:Medium (34-66%)] [5:Large (67-100%)] [6:None Present  (0%)] Granulation Quality: Red, Pink Red, Pink N/[name] Necrotic Amount: Medium (34-66%) Small (1-33%) Large (67-100%) Necrotic Tissue: Adherent [place], Adherent Slough Exposed Structures: Fascia: No N/[name] Fascia: No Fat: No Fat: No Tendon: No Tendon: No Muscle: No Muscle: No Joint: No Joint: No Bone: No Bone: No Limited to Skin Limited to Skin Breakdown Breakdown Epithelialization: None None None Periwound Skin Texture: Edema: Yes Edema: Yes Edema: No Scarring: Yes Scarring: Yes Excoriation: No Induration: No Callus: No Crepitus: No Fluctuance: No Friable: No Rash: No Scarring: No Periwound Skin Maceration: Yes Moist: Yes Moist: Yes Moisture: Moist: Yes Maceration: No Dry/Scaly: No Periwound Skin Color: No Abnormalities Noted No Abnormalities Noted Atrophie Blanche: No Cyanosis: No Ecchymosis: No Erythema: No Hemosiderin Staining: No Mottled: No Pallor: No Rubor: No Temperature: No Abnormality No Abnormality No Abnormality Tenderness on Yes Yes Yes Palpation: Wound Preparation: Ulcer Cleansing: Ulcer Cleansing: Ulcer Cleansing: Rinsed/Irrigated with Rinsed/Irrigated with Rinsed/Irrigated with Saline Saline Saline Topical Anesthetic Topical Anesthetic Topical Anesthetic Applied: Other: lidocaine Applied: Other: lidocaine Applied: Other: lidocaine 4% 4% 4% Treatment Notes Electronic Signature(s) Signed: 11/19/2015 5:54:58 PM By: [name] Entered By: [name] on 11/19/2015 13:24:44 [name], [name] ([id number]) [name], [name] ([id number]) -------------------------------------------------------------------------------- Multi-Disciplinary Care Plan Details Patient Name: [name], [name] Date of Service: 11/19/2015 12:45 PM Medical Record Number: [id number] Patient Account Number: [id number] Date of Birth/Sex: [birth year] (70 y.o. Female) Treating RN: [name] Primary Care Physician: Glendon Axe Other  Clinician: Referring Physician: Glendon Axe Treating Physician/Extender: Frann Rider in Treatment: 93 Active Inactive Orientation to the Wound Care Program Nursing Diagnoses: Knowledge deficit related to the wound healing center program Goals: Patient/caregiver will verbalize understanding of the [place] Program Date Initiated: 01/31/2015 Goal Status: Active Interventions: Provide education on orientation to the wound center Notes: Venous Leg Ulcer Nursing Diagnoses: Potential for venous Insuffiency (use before diagnosis confirmed) Goals: Non-invasive venous studies are completed as ordered Date Initiated: 01/31/2015 Goal Status: Active Patient/caregiver will verbalize understanding of disease process and disease management Date Initiated: 01/31/2015 Goal Status: Active Interventions: Assess peripheral edema status every visit. Notes: Wound/Skin Impairment Nursing Diagnoses: Impaired tissue integrity Knowledge deficit related to smoking impact on wound healing [name], [name] ([id number]) Goals: Patient/caregiver will verbalize understanding of skin care regimen Date Initiated: 01/31/2015 Goal Status: Active Ulcer/skin breakdown will heal within 14 weeks Date Initiated: 01/31/2015 Goal Status: Active Interventions: Assess ulceration(s) every visit Notes: Electronic Signature(s) Signed: 11/19/2015 5:54:58 PM By: [name] Entered By: [name] on 11/19/2015 13:24:37 [name], [name] ([id number]) -------------------------------------------------------------------------------- Patient/Caregiver Education Details Patient Name: Kristin Stare D. Date of Service: 11/19/2015 12:45 PM Medical Record Number: [id number] Patient Account Number: [id number] Date of Birth/Gender: Jan 16, 1946 (70 y.o. Female) Treating RN: [name] Primary Care Physician: Glendon Axe Other Clinician: Referring Physician: Glendon Axe Treating Physician/Extender:  Frann Rider in Treatment: 28 Education Assessment Education Provided To: Patient Education Topics Provided Venous:  Handouts: Other: wear compression hose daily to LLE Methods: Explain/Verbal Responses: State content correctly Electronic Signature(s) Signed: 11/19/2015 3:17:35 PM By: [name] Entered By: [name] on 11/19/2015 15:17:35 [name], [name] ([id number]) -------------------------------------------------------------------------------- Wound Assessment Details Patient Name: Aerts, Diahann D. Date of Service: 11/19/2015 12:45 PM Medical Record Number: [id number] Patient Account Number: [id number] Date of Birth/Sex: 12/01/1945 (70 y.o. Female) Treating RN: [name] Primary Care Physician: Glendon Axe Other Clinician: Referring Physician: Glendon Axe Treating Physician/Extender: Frann Rider in Treatment: 41 Wound Status Wound Number: 2 Primary Venous Leg Ulcer Etiology: Wound Location: Left Lower Leg - Distal Wound Open Wounding Event: Blister Status: Date Acquired: 12/30/2014 Comorbid Cataracts, Asthma, Coronary Artery Weeks Of Treatment: 41 History: Disease, Hypertension, Type II Clustered Wound: Yes Diabetes, Osteoarthritis, Neuropathy Photos Photo Uploaded By: [name] on 11/19/2015 16:33:23 Wound Measurements Length: (cm) 1.5 Width: (cm) 1.3 Depth: (cm) 0.1 Area: (cm) 1.532 Volume: (cm) 0.153 % Reduction in Area: -116.7% % Reduction in Volume: -115.5% Epithelialization: None Tunneling: No Undermining: No Wound Description Full Thickness Without Classification: Exposed Support Structures Diabetic Severity Grade 2 (Wagner): Wound Margin: Thickened Exudate Amount: Large Exudate Type: Serous Exudate Color: amber Foul Odor After Cleansing: No Wound Bed Granulation Amount: Medium (34-66%) Exposed Structure Aird, Kalima D. ([id number]) Granulation Quality: Red, Pink Fascia Exposed: No Necrotic Amount:  Medium (34-66%) Fat Layer Exposed: No Necrotic Quality: Adherent Slough Tendon Exposed: No Muscle Exposed: No Joint Exposed: No Bone Exposed: No Limited to Skin Breakdown Periwound Skin Texture Texture Color No Abnormalities Noted: No No Abnormalities Noted: No Localized Edema: Yes Temperature / Pain Scarring: Yes Temperature: No Abnormality Moisture Tenderness on Palpation: Yes No Abnormalities Noted: No Maceration: Yes Moist: Yes Wound Preparation Ulcer Cleansing: Rinsed/Irrigated with Saline Topical Anesthetic Applied: Other: lidocaine 4%, Treatment Notes Wound #2 (Left, Distal Lower Leg) 1. Cleansed with: Clean wound with Normal Saline 2. Anesthetic Topical Lidocaine 4% cream to wound bed prior to debridement 3. Peri-wound Care: Skin Prep 4. Dressing Applied: Mepitel 5. Secondary Dressing Applied Bordered Foam Dressing Dry Gauze 7. Secured with Patient to wear own compression stockings Notes apligraf applied by Dr Con Memos today in clinic, Electronic Signature(s) Signed: 11/19/2015 5:54:58 PM By: [name] Entered By: [name] on 11/19/2015 13:23:11 Vallee, Javeah D. ([id number]) -------------------------------------------------------------------------------- Wound Assessment Details Patient Name: TOMMI, GILROY D. Date of Service: 11/19/2015 12:45 PM Medical Record Number: [id number] Patient Account Number: [id number] Date of Birth/Sex: [birth year] (70 y.o. Female) Treating RN: [name] Primary Care Physician: Glendon Axe Other Clinician: Referring Physician: Glendon Axe Treating Physician/Extender: Frann Rider in Treatment: 41 Wound Status Wound Number: 5 Primary Venous Leg Ulcer Etiology: Wound Location: Right Lower Leg - Posterior Wound Open Wounding Event: Gradually Appeared Status: Date Acquired: 04/29/2015 Comorbid Cataracts, Asthma, Coronary Artery Weeks Of Treatment: 26 History: Disease, Hypertension, Type II Clustered  Wound: No Diabetes, Osteoarthritis, Neuropathy Photos Photo Uploaded By: [name] on 11/19/2015 16:33:23 Wound Measurements Length: (cm) 5.4 Width: (cm) 8.3 Depth: (cm) 0.3 Area: (cm) 35.202 Volume: (cm) 10.56 % Reduction in Area: -18624.5% % Reduction in Volume: -55478.9% Epithelialization: None Tunneling: No Undermining: No Wound Description Full Thickness Without Exposed Foul Odor [name] Classification: Support Structures Diabetic Severity Grade 2 (Wagner): Wound Margin: Thickened Exudate Amount: Large Exudate Type: Serosanguineous Exudate Color: red, brown fter Cleansing: No Wound Bed Granulation Amount: Large (67-100%) Dhami, Lexxi D. ([id number]) Granulation Quality: Red, Pink Necrotic Amount: Small (1-33%) Necrotic Quality: Adherent Slough Periwound Skin Texture Texture Color No Abnormalities Noted: No No Abnormalities Noted: No Localized  Edema: Yes Temperature / Pain Scarring: Yes Temperature: No Abnormality Moisture Tenderness on Palpation: Yes No Abnormalities Noted: No Moist: Yes Wound Preparation Ulcer Cleansing: Rinsed/Irrigated with Saline Topical Anesthetic Applied: Other: lidocaine 4%, Treatment Notes Wound #5 (Right, Posterior Lower Leg) 1. Cleansed with: Clean wound with Normal Saline 2. Anesthetic Topical Lidocaine 4% cream to wound bed prior to debridement 4. Dressing Applied: Mepitel 8. Negative Pressure Wound Therapy Wound Vac to wound continuously at 167mm/hg pressure Black Foam Notes apligraf applied by Dr Con Memos today in clinic, 1 black foam used Electronic Signature(s) Signed: 11/19/2015 5:54:58 PM By: [name] Entered By: [name] on 11/19/2015 13:23:38 Bettes, Kaylon D. ([id number]) -------------------------------------------------------------------------------- Wound Assessment Details Patient Name: Kristin Stare D. Date of Service: 11/19/2015 12:45 PM Medical Record Number: [id number] Patient Account Number:  [id number] Date of Birth/Sex: 04-Dec-1945 (70 y.o. Female) Treating RN: [name] Primary Care Physician: Glendon Axe Other Clinician: Referring Physician: Glendon Axe Treating Physician/Extender: Frann Rider in Treatment: 41 Wound Status Wound Number: 6 Primary Diabetic Wound/Ulcer of the Lower Etiology: Extremity Wound Location: Right Lower Leg - Medial Wound Open Wounding Event: Gradually Appeared Status: Date Acquired: 10/10/2015 Comorbid Cataracts, Asthma, Coronary Artery Weeks Of Treatment: 5 History: Disease, Hypertension, Type II Clustered Wound: No Diabetes, Osteoarthritis, Neuropathy Photos Photo Uploaded By: [name] on 11/19/2015 16:33:37 Wound Measurements Length: (cm) 6 Width: (cm) 4.3 Depth: (cm) 0.3 Area: (cm) 20.263 Volume: (cm) 6.079 % Reduction in Area: -486.3% % Reduction in Volume: -1656.9% Epithelialization: None Tunneling: No Undermining: No Wound Description Classification: Grade 1 Wound Margin: Distinct, outline attached Exudate Amount: Medium Exudate Type: Purulent Exudate Color: yellow, brown, green Foul Odor After Cleansing: No Wound Bed Granulation Amount: None Present (0%) Exposed Structure Necrotic Amount: Large (67-100%) Fascia Exposed: No Necrotic Quality: Eschar, Adherent Slough Fat Layer Exposed: No Tendon Exposed: No Bunting, Adely D. ([id number]) Muscle Exposed: No Joint Exposed: No Bone Exposed: No Limited to Skin Breakdown Periwound Skin Texture Texture Color No Abnormalities Noted: No No Abnormalities Noted: No Callus: No Atrophie Blanche: No Crepitus: No Cyanosis: No Excoriation: No Ecchymosis: No Fluctuance: No Erythema: No Friable: No Hemosiderin Staining: No Induration: No Mottled: No Localized Edema: No Pallor: No Rash: No Rubor: No Scarring: No Temperature / Pain Moisture Temperature: No Abnormality No Abnormalities Noted: No Tenderness on Palpation: Yes Dry / Scaly:  No Maceration: No Moist: Yes Wound Preparation Ulcer Cleansing: Rinsed/Irrigated with Saline Topical Anesthetic Applied: Other: lidocaine 4%, Treatment Notes Wound #6 (Right, Medial Lower Leg) 1. Cleansed with: Clean wound with Normal Saline 2. Anesthetic Topical Lidocaine 4% cream to wound bed prior to debridement 4. Dressing Applied: Santyl Ointment Other dressing (specify in notes) 5. Secondary Dressing Applied Dry Gauze Notes drawtex covered with NPWT drape Electronic Signature(s) Signed: 11/19/2015 5:54:58 PM By: [name] Entered By: [name] on 11/19/2015 13:23:52 Tobin, Keaunna D. ([id number]) -------------------------------------------------------------------------------- Vitals Details Patient Name: Kristin Stare D. Date of Service: 11/19/2015 12:45 PM Medical Record Number: [id number] Patient Account Number: [id number] Date of Birth/Sex: 05/03/1946 (70 y.o. Female) Treating RN: [name] Primary Care Physician: Glendon Axe Other Clinician: Referring Physician: Glendon Axe Treating Physician/Extender: Frann Rider in Treatment: 41 Vital Signs Time Taken: 12:59 Temperature (F): 98.2 Height (in): 65 Pulse (bpm): 88 Weight (lbs): 248 Respiratory Rate (breaths/min): 18 Body Mass Index (BMI): 41.3 Blood Pressure (mmHg): 136/52 Reference Range: 80 - 120 mg / dl Electronic Signature(s) Signed: 11/19/2015 5:54:58 PM By: [name] Entered By: [name] on 11/19/2015 13:00:01

## 2015-11-20 NOTE — Progress Notes (Signed)
[name], [name] ([id number]) Visit Report for 11/19/2015 Chief Complaint Document Details Patient Name: [name], [name] 11/19/2015 12:45 Date of Service: PM Medical Record [id number] Number: Patient Account Number: [id number] [birth year] (70 y.o. Treating RN: [name] Date of Birth/Sex: Female) Other Clinician: Primary Care Physician: [facility], Delana Meyer Treating [name] Referring Physician: Glendon Axe Physician/Extender: Weeks in Treatment: 13 Information Obtained from: Patient Chief Complaint Patient presents to the wound care center for a consult due non healing wound 70 y.o. patient comes with a history of having a ulcer on the left lower extremity for the past 4 weeks. she says she's had swelling of both lower extremities for about a year after she started having prednisone. 02/07/2015 -- her vascular appointments obtained were in the first and third week of June. she is able to go to [place] and we will try and get her some earlier appointments. Other than that nothing else has changed in her management. Electronic Signature(s) Signed: 11/19/2015 2:22:34 PM By: [name], [name] Entered By: [name] on 11/19/2015 14:22:34 [name], [name] ([id number]) -------------------------------------------------------------------------------- Cellular or Tissue Based Product Details Patient Name: [name], [name] 11/19/2015 12:45 Date of Service: PM Medical Record [id number] Number: Patient Account Number: [id number] July 10, 1946 (70 y.o. Treating RN: [name] Date of Birth/Sex: Female) Other Clinician: Primary Care Physician: [facility], Delana Meyer Treating [name] Referring Physician: Glendon Axe Physician/Extender: Weeks in Treatment: 41 Cellular or Tissue Based Wound #2 Left,Distal Lower Leg Product Type Applied to: Performed By: Physician [name], MD Cellular or Tissue Based Apligraf Product Type: Time-Out Taken: Yes Location: genitalia /  hands / feet / multiple digits Wound Size (sq cm): 1.95 Product Size (sq cm): 44 Waste Size (sq cm): 42 Waste Reason: wound size Amount of Product Applied (sq cm): 2 Lot #: [id number] Expiration Date: 11/26/2015 Fenestrated: Yes Instrument: Blade Reconstituted: Yes Solution Type: saline Solution Amount: 26ml Lot #: [id number] Solution Expiration 02/22/2017 Date: Secured: Yes Secured With: Steri-Strips Dressing Applied: Yes Primary Dressing: mepitel one Procedural Pain: 0 Post Procedural Pain: 0 Response to Treatment: Procedure was tolerated well Post Procedure Diagnosis Same as Pre-procedure Electronic Signature(s) Signed: 11/19/2015 2:22:00 PM By: [name], [name] LAJLA, HILBUN D. ([id number]) Entered By: [name] on 11/19/2015 14:22:00 Markovic, Tia Masker ([id number]) -------------------------------------------------------------------------------- Cellular or Tissue Based Product Details Patient Name: [name], [name] 11/19/2015 12:45 Date of Service: PM Medical Record [id number] Number: Patient Account Number: [id number] 1946/02/26 (70 y.o. Treating RN: [name] Date of Birth/Sex: Female) Other Clinician: Primary Care Physician: [facility], Delana Meyer Treating [name] Referring Physician: Glendon Axe Physician/Extender: Weeks in Treatment: 41 Cellular or Tissue Based Wound #5 Right,Posterior Lower Leg Product Type Applied to: Performed By: Physician [name], MD Cellular or Tissue Based Apligraf Product Type: Time-Out Taken: Yes Location: genitalia / hands / feet / multiple digits Wound Size (sq cm): 44.82 Product Size (sq cm): 44 Waste Size (sq cm): 0 Amount of Product Applied (sq cm): 44 Lot #: [id number] Expiration Date: 11/26/2015 Fenestrated: Yes Instrument: Blade Reconstituted: Yes Solution Type: saline Solution Amount: 37ml Lot #: [id number] Solution Expiration 02/22/2017 Date: Secured: Yes Secured With: Steri-Strips Dressing Applied:  Yes Primary Dressing: mepitel one Procedural Pain: 0 Post Procedural Pain: 0 Response to Treatment: Procedure was tolerated well Post Procedure Diagnosis Same as Pre-procedure Electronic Signature(s) Signed: 11/19/2015 2:22:12 PM By: [name], [name] Entered By: [name] on 11/19/2015 14:22:12 Sullivant, OLUKEMI CLOTFELTER ([id number]) Jantz, Charlea D. ([id number]) -------------------------------------------------------------------------------- Debridement Details Patient Name: Kristin Stare D. 11/19/2015 12:45 Date of Service:  PM Medical Record [id number] Number: Patient Account Number: [id number] 23-Dec-1945 (70 y.o. Treating RN: [name] Date of Birth/Sex: Female) Other Clinician: Primary Care Physician: [facility], [place] Annalise Mcdiarmid Referring Physician: Glendon Axe Physician/Extender: Weeks in Treatment: 41 Debridement Performed for Wound #6 Right,Medial Lower Leg Assessment: Performed By: Physician [name], MD Debridement: Debridement Pre-procedure Yes Verification/Time Out Taken: Start Time: 13:44 Pain Control: Lidocaine 4% Topical Solution Level: Skin/Subcutaneous Tissue Total Area Debrided (L x 6 (cm) x 4.3 (cm) = 25.8 (cm) W): Tissue and other Viable, Non-Viable, Eschar, Fibrin/Slough, Subcutaneous material debrided: Instrument: Blade, Forceps Bleeding: Minimum Hemostasis Achieved: Pressure End Time: 13:47 Procedural Pain: 0 Post Procedural Pain: 0 Response to Treatment: Procedure was tolerated well Post Debridement Measurements of Total Wound Length: (cm) 6 Width: (cm) 4.3 Depth: (cm) 0.3 Volume: (cm) 6.079 Post Procedure Diagnosis Same as Pre-procedure Notes The left lower extremity wound is looking excellent and has very little open area with healthy granulation tissue. This was covered with Apligraf in the usual fashion. The right leg was still a lateral area is again extremely clean and Apligraf was applied below the  wound VAC. The medial wound on the right leg has significant amount of slough which was carefully sharply dissected Osso, Azaria D. ([id number]) skin and subcutaneous tissue with a forcep and 15 blade. This was a separate wound which was different from the one Apligraf was applied and this has been treated appropriately Electronic Signature(s) Signed: 11/19/2015 2:27:44 PM By: [name], [name] Signed: 11/19/2015 5:54:58 PM By: [name] Previous Signature: 11/19/2015 2:22:26 PM Version By: [name], [name] Entered By: [name] on 11/19/2015 14:27:43 Appleton, CHAMARA MALAK ([id number]) -------------------------------------------------------------------------------- HPI Details Patient Name: [name], [name] 11/19/2015 12:45 Date of Service: PM Medical Record [id number] Number: Patient Account Number: [id number] 03-18-1946 (71 y.o. Treating RN: [name] Date of Birth/Sex: Female) Other Clinician: Primary Care Physician: [facility], Delana Meyer Treating Camar Guyton Referring Physician: Glendon Axe Physician/Extender: Weeks in Treatment: 49 History of Present Illness HPI Description: 70 year old patient who is known to have diabetes mellitus type 2, chronic renal insufficiency, coronary artery disease, hypertension, hypercholesterolemia, temporal arteritis and inflammatory arthritiss also has a history of having a hysterectomy and some orthopedic related surgeries. The ulcer on the left lower extremity started off as a blister and then. Got progressively worse. She does not have any fever or chills and has not had any recent surgical intervention for this. Her last hemoglobin A1c was 10.1 in September 2015. She has been recently put on doxycycline by her PCP. She is now also allergic to doxycycline and was this was changed over to Keflex. due to her temporal arteritis she has been on prednisone for about a year and she says ever since that she has had swelling of both lower  extremities. She does see a cardiologist and also takes a diuretic. 02/07/2015 her arterial and venous duplex studies to be done have dates been given as the first and third week of June. This is at [facility]. We are going to try and get early appointments at [facility] other than that nothing has changed in her management. 02/14/2015 -- we have been able to get her an appointment in [facility] on May 20 which is much earlier than her previous ones at [place]. She continues with her prednisone and her sugars are in the range of 150-200. 02/21/2015 We were able to get a vascular lab workup for her today and she is going to be there at 2:00 this afternoon.  the swelling of her leg has gone down significantly but she still has some tenderness over the wounds. 02/28/2015 - She has had one of two vascular workups done, and this coming Tuesday has another, at [facility]. She continues to be on steroid medications. She has significant sensitivity in her left lower extremity and has pain suggestive of neuropathic pain and I have asked her to address this with her primary care physician. 03/07/2015 -- The patient saw Dr. Lucky Cowboy for a consultation and he has had her arteries are okay but she has 2 incompetent veins on the left lower extremity and he is going to set her up for surgery. Official report is awaited. Addendum: Official reports are now available and on 03/04/2015. She was seen and lower extremity venous duplex exam was done. There was reflux present within the left greater saphenous vein below the knee and also the left small saphenous vein. Arterial duplex showed normal triphasic waveforms throughout the left lower extremity without any significant stenosis. Her ABIs were noncompressible bilaterally but a waveforms were normal and a digital pressures were normal bilaterally consistent with no significant arterial insufficiency. He has recommended endovenous  ablation of both the left small saphenous and the left great saphenous vein. This would still be scheduled later. 03/14/2015 -- she has heard back from the vascular office and has surgery scheduled for sometime in July. [name], [name] ([id number]) Her rheumatologist has decreased her prednisone dosage but she still on it. She has also had cataract surgery in her right eye recently this week. 03/20/2015 - No new complaints today. Pain improved. No fever or chills. Tolerating 2 layer compression. 04/14/2015 -- she was doing very well today she went off on vacation and now her edema has increased markedly the ulceration is bigger and her diabetes is not under control. 04/21/2015 -- I spoke to her PCP Dr. Candiss Norse and discussed the management which would include being seen by a general surgeon for debridement and taking multiple punch biopsies which would help in establishing the diagnosis of this is a vasculitis. She is agreeable about this and will set her up for the procedure with Dr. Tamala Julian at [facility]. She was seen by the surgeon Dr. Jamal Collin. 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 She is scheduled to have laser ablation done next week.Pt was sent here for possible biopsy to look for vasculitis. Feel it would be better to wait after laser ablation is completed- the ulcer may heal fully and biopsy may not be necessary 04/29/2015 -- she had the venous ablation done by Dr. Lucky Cowboy last Friday and we do not have any notes yet. She is doing fine otherwise. 05/06/2015 --Review of her recent vascular intervention shows that she was seen by Dr. Lucky Cowboy on 04/29/2015. The follow-up duplex which was done showed that both the great saphenous vein and the small saphenous vein remained patent with reflux consistent with an unsuccessful ablation. He has rescheduled her for another the endovenous  ablation to be done in about 4 weekso time. 05/13/2015 -- he was seen by her surgeon Dr. Jamal Collin who asked her to continue with conservative therapy and he would speak to Dr. Lucky Cowboy about her management. Dr. Lucky Cowboy is going to schedule her surgery in the middle of August for a repeat endovenous ablation. Her pus culture from last week has grown : [place]  GROWTH ENTEROCOCCUS FAECALIS her noted her sensitivity report but due to her multiple allergies I had tried clindamycin and she developed a rash with this too. She has been prescribed and anti-buttocks in the ER and is has it at home and she will let is know what she is going to be taking. 05/20/2015 -- she has developed a small spot on her right lower extremity but besides that it is not a full fledged ulceration. She did not get to see Dr. Lucky Cowboy last week and hopefully she will see him in the near future. 05/27/2015 -she is still awaiting her appointment with Dr.Dew and her vascular procedure is not scheduled until August 19. She will be seeing her PCP tomorrow and I have asked her to convey our discussion so that she is aware that debridement has not been done yet. 06/03/2015 -- was seen by her rheumatologist Dr. 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 He is lowering her prednisone to 12-1/2 mg for 1 month and then 10 mg per the next month. I will again make an attempt to speak to her PCP Dr. Candiss Norse and her surgeon Dr. 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 06/17/2015 -- Dr.Dew did her vascular procedure last week and a follow-up venous ultrasound shows good resolution of the veins as per the patient's history. He is to see her back in 2 weeks. [name], [name]  ([id number]) 07/07/2015. -- the patient has had a heavy growth of Proteus mirabilis and Enterococcus faecalis. These are sensitive to several drugs but the problem is she has allergies to all of these and hence I would like her to see Dr. Ola Spurr for this. She is also due to see Dr. Lucky Cowboy tomorrow and I will discussed the management with him including debridement under anesthesia and possible biopsies. 07/14/2015 -- she has an appointment to see Dr. Ola Spurr tomorrow and did see Dr. Bunnie Domino PA who will discuss my request with him. 07/21/2015 -- saw Dr. Ola Spurr was able to do a test on her and has put her on amoxicillin. She has been tolerating that and has had no problems with allergies to this. 07/28/2015 -- Last Friday I spoke to Dr. Leotis Pain regarding her care and he said that her right leg did not need any surgery and on the left leg was doing pretty good. We did agree that if she undergoes any procedure in the future he would do a couple of punch biopsies of the wound. 08/18/2015 -- her right leg is very tender and there is significant amount of slough. The left leg is looking much better 09/02/2015 -- she is going to have a debridement and punch biopsies of her right lower extremity by Dr. Leotis Pain this coming Thursday. Also seen Dr. Ola Spurr who has continued her on ciprofloxacin. 09/09/2015 -- on 09/04/2015 Dr. Leotis Pain took her to surgery - Irrigation and excisional debridement of skin, soft tissue, and muscle to about 40 cm2 to the right posterior calf with biopsy. Pathology results are -- DIAGNOSIS: SKIN, RIGHT LOWER EXTREMITY; BIOPSY: SKIN AND SOFT TISSUE WITH ULCERATION, SEE NOTE. - NEGATIVE FOR DYSPLASIA AND MALIGNANCY. Note: There is acute inflammation and ulceration of the epidermis. The dermis shows nonspecific inflammation, neovascularization, and hemosiderin deposition. The differential diagnosis for these findings includes stasis dermatitis, nonspecific  dermatitis,  and infection. Correlation with clinical findings is required. A PAS fungal stain is obtained and results will be reported in an addendum. cultures were also sent and this grew Pseudomonas aeruginosa, Escherichia coli, Proteus mirabilis, Klebsiella pneumoniae and they were all sensitive to ciprofloxacin which she is on. 09/16/2015 -- he saw Dr. 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 He is going to call the patient in and discuss with her the management possibly with Imuran. I will continue treating her locally. 09/23/2015 -- she saw Dr. Ola Spurr today who was going to continue the antibiotics for now and stop after this course. She has an appointment to see Dr. Jefm Bryant in about a week's time. Her wound VAC has arrived but she did not bring it with her today. We will try and set her up for changes to the right leg 3 times a week. She is here for her first application of Apligraf to the left lower extremity. 09/30/2015 -- she has seen Dr. 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 because she is ambulatory at home health will not apply a wound VAC and she will have to come here 3 times a week on Monday Wednesday and Friday. 10/10/2015 -- she is here for a second application of Apligraf to her left lower extremity. 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 Jefm Bryant. Other than that she is doing well 10/31/2015 -- she is here for a third application of Apligraf to her left lower extremity. after reviewing her wound on the right side I noted that it is granulating extremely well and we will use the remnants of the Apligraf on the right leg. NICAELA, TREESH ([id number]) 11/14/2015 -- she was recently admitted to the  hospital between January 11 and 11/10/2015 for uncontrolled hypertension, diabetes mellitus, acute kidney injury. during her admission she was supported by wound care and also by antibiotics. Around this time her immunosuppression was stopped by Dr. Nunzio Cory. She is feeling much better now. Electronic Signature(s) Signed: 11/19/2015 2:22:44 PM By: [name], [name] Entered By: [name] on 11/19/2015 14:22:42 JAYLNN, DARLING ([id number]) -------------------------------------------------------------------------------- Physical Exam Details Patient Name: KAYREN, KISSLING 11/19/2015 12:45 Date of Service: PM Medical Record [id number] Number: Patient Account Number: [id number] Feb 20, 1946 (70 y.o. Treating RN: [name] Date of Birth/Sex: Female) Other Clinician: Primary Care Physician: [facility], Delana Meyer Treating [name] Referring Physician: Glendon Axe Physician/Extender: Weeks in Treatment: 41 Constitutional . Pulse regular. Respirations normal and unlabored. Afebrile. . Eyes Nonicteric. Reactive to light. Ears, Nose, Mouth, and Throat Lips, teeth, and gums WNL.Marland Kitchen Moist mucosa without lesions. Neck supple and nontender. No palpable supraclavicular or cervical adenopathy. Normal sized without goiter. Respiratory WNL. No retractions.. Cardiovascular Pedal Pulses WNL. No clubbing, cyanosis or edema. Chest Breasts symmetical and no nipple discharge.. Breast tissue WNL, no masses, lumps, or tenderness.. Lymphatic No adneopathy. No adenopathy. No adenopathy. Musculoskeletal Adexa without tenderness or enlargement.. Digits and nails w/o clubbing, cyanosis, infection, petechiae, ischemia, or inflammatory conditions.. Integumentary (Hair, Skin) No suspicious lesions. No crepitus or fluctuance. No peri-wound warmth or erythema. No masses.Marland Kitchen Psychiatric Judgement and insight Intact.. No evidence of depression, anxiety, or agitation.. Notes the left lower extremity wound  is looking excellent and has very little open area with healthy  granulation tissue. This was covered with Apligraf in the usual fashion. The right leg was still a lateral area is again extremely clean and Apligraf was applied below the wound VAC. The medial wound on the right leg has significant amount of slough which was carefully sharply dissected skin and subcutaneous tissue with a forcep and 15 blade. Electronic Signature(s) Signed: 11/19/2015 2:24:13 PM By: [name], [name] ISHARA, BEAUDET ([id number]) Entered By: [name] on 11/19/2015 14:24:13 Palau, TAYLIANA WINNER ([id number]) -------------------------------------------------------------------------------- Physician Orders Details Patient Name: ILEENE, FLESZAR D. 11/19/2015 12:45 Date of Service: PM Medical Record [id number] Number: Patient Account Number: [id number] Mar 19, 1946 (69 y.o. Treating RN: [name] Date of Birth/Sex: Female) Other Clinician: Primary Care Physician: [facility], Delana Meyer Treating [name] Referring Physician: Glendon Axe Physician/Extender: Weeks in Treatment: 40 Verbal / Phone Orders: Yes Clinician: [name] Read Back and Verified: Yes Diagnosis Coding Wound Cleansing Wound #2 Left,Distal Lower Leg o Cleanse wound with mild soap and water o May shower with protection. Wound #5 Right,Posterior Lower Leg o Cleanse wound with mild soap and water o May shower with protection. Wound #6 Right,Medial Lower Leg o Cleanse wound with mild soap and water o May shower with protection. Skin Barriers/Peri-Wound Care Wound #2 Left,Distal Lower Leg o Skin Prep Wound #5 Right,Posterior Lower Leg o Skin Prep Wound #6 Right,Medial Lower Leg o Skin Prep Primary Wound Dressing Wound #2 Left,Distal Lower Leg o Mepitel One - over apligraf Wound #5 Right,Posterior Lower Leg o Mepitel One - over apligraf Wound #6 Right,Medial Lower Leg o Dry Gauze o [address] MYNDEE, CREQUE D. ([id number]) Dressing Change Frequency Wound #5 Right,Posterior Lower Leg o Change Dressing Monday, Wednesday, Friday o Change Dressing Monday, Wednesday, Friday Wound #6 Right,Medial Lower Leg o Change Dressing Monday, Wednesday, Friday Wound #2 Left,Distal Lower Leg o Change dressing every week Follow-up Appointments Wound #2 Left,Distal Lower Leg o Return Appointment in 1 week. o Other: - nurse visit Monday and Wednesday Wound #5 Right,Posterior Lower Leg o Return Appointment in 1 week. o Other: - nurse visit Monday and Wednesday Wound #6 Right,Medial Lower Leg o Return Appointment in 1 week. o Other: - nurse visit Monday and Wednesday Edema Control Wound #2 Left,Distal Lower Leg o Patient to wear own compression stockings Negative Pressure Wound Therapy Wound #5 Right,Posterior Lower Leg o Wound VAC settings at 125/130 mmHg continuous pressure. Use BLACK/GREEN foam to wound cavity. Use WHITE foam to fill any tunnel/s and/or undermining. Change VAC dressing 2 X WEEK. Change canister as indicated when full. Nurse may titrate settings and frequency of dressing changes as clinically indicated. o Number of foam/gauze pieces used in the dressing = - 1 [employer] Wound #2 Left,Distal Lower Leg o Apligraf application in clinic; including contact layer, fixation with steri strips, dry gauze and cover dressing. Wound #5 Right,Posterior Lower Leg o Apligraf application in clinic; including contact layer, fixation with steri strips, dry gauze and cover dressing. Medications-please add to medication list. Wound #6 Right,Medial Lower Leg [name], HALAS. ([id number]) o Santyl Enzymatic Ointment Electronic Signature(s) Signed: 11/19/2015 4:12:51 PM By: [name], [name] Signed: 11/19/2015 5:54:58 PM By: [name] Entered By: [name] on 11/19/2015 13:50:18 Landen, DECKLYN SENTER  ([id number]) -------------------------------------------------------------------------------- Problem List Details Patient Name: KILEEN, GETCHELL D. 11/19/2015 12:45 Date of Service: PM Medical Record [id number] Number: Patient Account Number: [id number] Jan 25, 1946 (70 y.o. 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 01/31/2015 Yes exposed E66.01 Morbid (severe) obesity due to excess calories 01/31/2015 Yes I89.0 Lymphedema, not elsewhere classified 01/31/2015 Yes I83.222 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 ([id number]) -------------------------------------------------------------------------------- Progress Note Details Patient Name: SEREENA, ERDAHL D. 11/19/2015 12:45 Date of Service: PM Medical Record [id number] Number: Patient Account Number: [id number] 05/25/1946 (70 y.o. Treating RN: [name] Date of Birth/Sex: Female) Other Clinician: Primary Care Physician: [facility], Delana Meyer Treating [name] Referring Physician: Glendon Axe Physician/Extender: Weeks in Treatment: 41 Subjective Chief Complaint Information obtained from Patient Patient presents to the wound care center for a  consult due non healing wound 71 year old patient comes with a history of having a ulcer on the left lower extremity for the past 4 weeks. she says she's had swelling of both lower extremities for about a year after she started having prednisone. 02/07/2015 -- her vascular appointments obtained were in the first and third week of June. she is able to go to [place] and we will try and get her some earlier appointments. Other than that nothing else has changed in her management. History of Present Illness (HPI) 70 year old patient who is known to have diabetes mellitus type 2, chronic renal insufficiency, coronary artery disease, hypertension, hypercholesterolemia, temporal arteritis and inflammatory arthritiss also has a history of having a hysterectomy and some orthopedic related surgeries. The ulcer on the left lower extremity started off as a blister and then. Got progressively worse. She does not have any fever or chills and has not had any recent surgical intervention for this. Her last hemoglobin A1c was 10.1 in September 2015. She has been recently put on doxycycline by her PCP. She is now also allergic to doxycycline and was this was changed over to Keflex. 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 She does see a cardiologist and also takes a diuretic. 02/07/2015 her arterial and venous duplex studies to be done have dates been given as the first and third week of June. This is at [facility]. We are going to try and get early appointments at [facility]. other than that nothing has changed in her management. 02/14/2015 -- we have been able to get her an appointment in [facility] on May 20 which is much earlier than her previous ones at [place]. She continues with her prednisone and her sugars are in the range of 150-200. 02/21/2015 We were able to get a vascular lab  workup for her today and she is going to be there at 2:00 this afternoon. the swelling of her leg has gone down significantly but she still has some tenderness over the wounds. 02/28/2015 - She has had one of two vascular workups done, and this coming Tuesday has another, at [facility]. She continues to be on steroid medications. She has significant sensitivity in her left lower  extremity and has pain suggestive of neuropathic pain and I have asked her to address this with her primary care physician. 03/07/2015 -- The patient saw Dr. Lucky Cowboy for a consultation and he has had her arteries are okay but she has 2 incompetent veins on the left lower extremity and he is going to set her up for surgery. Official report is JALILAH, HAWKEN ([id number]) awaited. Addendum: Official reports are now available and on 03/04/2015. She was seen and lower extremity venous duplex exam was done. There was reflux present within the left greater saphenous vein below the knee and also the left small saphenous vein. Arterial duplex showed normal triphasic waveforms throughout the left lower extremity without any significant stenosis. Her ABIs were noncompressible bilaterally but a waveforms were normal and a digital pressures were normal bilaterally consistent with no significant arterial insufficiency. He has recommended endovenous ablation of both the left small saphenous and the left great saphenous vein. This would still be scheduled later. 03/14/2015 -- she has heard back from the vascular office and has surgery scheduled for sometime in July. Her rheumatologist has decreased her prednisone dosage but she still on it. She has also had cataract surgery in her right eye recently this week. 03/20/2015 - No new complaints today. Pain improved. No fever or chills. Tolerating 2 layer compression. 04/14/2015 -- she was doing very well today she went off on vacation and now her edema has  increased markedly the ulceration is bigger and her diabetes is not under control. 04/21/2015 -- I spoke to her PCP Dr. Candiss Norse and discussed the management which would include being seen by a general surgeon for debridement and taking multiple punch biopsies which would help in establishing the diagnosis of this is a vasculitis. She is agreeable about this and will set her up for the procedure with Dr. Tamala Julian at [facility]. She was seen by the surgeon Dr. Jamal Collin. 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 She is scheduled to have laser ablation done next week.Pt was sent here for possible biopsy to look for vasculitis. Feel it would be better to wait after laser ablation is completed- the ulcer may heal fully and biopsy may not be necessary 04/29/2015 -- she had the venous ablation done by Dr. Lucky Cowboy last Friday and we do not have any notes yet. She is doing fine otherwise. 05/06/2015 --Review of her recent vascular intervention shows that she was seen by Dr. Lucky Cowboy on 04/29/2015. The follow-up duplex which was done showed that both the great saphenous vein and the small saphenous vein remained patent with reflux consistent with an unsuccessful ablation. He has rescheduled her for another the endovenous ablation to be done in about 4 weeks time. 05/13/2015 -- he was seen by her surgeon Dr. Jamal Collin who asked her to continue with conservative therapy and he would speak to Dr. Lucky Cowboy about her management. Dr. Lucky Cowboy is going to schedule her surgery in the middle of August for a repeat endovenous ablation. Her pus culture from last week has grown : [place] her noted her sensitivity report but due to her multiple allergies I had tried clindamycin and she developed a rash with this too. She has  been prescribed and anti-buttocks in the ER and is has it at home and she will let is know what she is going to be taking. 05/20/2015 --  she has developed a small spot on her right lower extremity but besides that it is not a full fledged ulceration. She did not get to see Dr. Lucky Cowboy last week and hopefully she will see him in the near future. 05/27/2015 -she is still awaiting her appointment with Dr.Dew and her vascular procedure is not scheduled until August 19. She will be seeing her PCP tomorrow and I have asked her to convey our discussion so that she is aware that debridement has not been done yet. [name], [name] ([id number]) 06/03/2015 -- was seen by her rheumatologist Dr. 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 He is lowering her prednisone to 12-1/2 mg for 1 month and then 10 mg per the next month. I will again make an attempt to speak to her PCP Dr. Candiss Norse and her surgeon Dr. 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 06/17/2015 -- Dr.Dew did her vascular procedure last week and a follow-up venous ultrasound shows good resolution of the veins as per the patient's history. He is to see her back in 2 weeks. 07/07/2015. -- the patient has had a heavy growth of Proteus mirabilis and Enterococcus faecalis. These are sensitive to several drugs but the problem is she has allergies to all of these and hence I would like her to see Dr. Ola Spurr for this. She is also due to see Dr. Lucky Cowboy tomorrow and I will discussed the management with him including debridement under anesthesia and possible biopsies. 07/14/2015 -- she has an appointment to see Dr. Ola Spurr tomorrow and did see Dr. Bunnie Domino PA who will discuss my request with him. 07/21/2015 -- saw Dr. Ola Spurr was able to do a  test on her and has put her on amoxicillin. She has been tolerating that and has had no problems with allergies to this. 07/28/2015 -- Last Friday I spoke to Dr. Leotis Pain regarding her care and he said that her right leg did not need any surgery and on the left leg was doing pretty good. We did agree that if she undergoes any procedure in the future he would do a couple of punch biopsies of the wound. 08/18/2015 -- her right leg is very tender and there is significant amount of slough. The left leg is looking much better 09/02/2015 -- she is going to have a debridement and punch biopsies of her right lower extremity by Dr. Leotis Pain this coming Thursday. Also seen Dr. Ola Spurr who has continued her on ciprofloxacin. 09/09/2015 -- on 09/04/2015 Dr. Leotis Pain took her to surgery - Irrigation and excisional debridement of skin, soft tissue, and muscle to about 40 cm2 to the right posterior calf with biopsy. Pathology results are -- DIAGNOSIS: SKIN, RIGHT LOWER EXTREMITY; BIOPSY: SKIN AND SOFT TISSUE WITH ULCERATION, SEE NOTE. - NEGATIVE FOR DYSPLASIA AND MALIGNANCY. Note: There is acute inflammation and ulceration of the epidermis. The dermis shows nonspecific inflammation, neovascularization, and hemosiderin deposition. The differential diagnosis for these findings includes stasis dermatitis, nonspecific dermatitis, and infection. Correlation with clinical findings is required. A PAS fungal stain is obtained and results will be reported in an addendum. cultures were also sent and this grew Pseudomonas aeruginosa, Escherichia coli, Proteus mirabilis, Klebsiella pneumoniae and they were all sensitive to ciprofloxacin which she is on. 09/16/2015 -- he saw Dr. 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 He is going to call the patient in and discuss with her the management  possibly with Imuran. I will continue treating her locally. 09/23/2015 -- she saw Dr. Ola Spurr today who was going to continue the antibiotics for now and stop after this course. She has an appointment to see Dr. Jefm Bryant in about a week's time. Her wound VAC has arrived but she did not bring it with her today. We will try and set her up for changes to the right leg 3 times a week. She is here for her first application of Apligraf to the left lower extremity. BRIAN, ORREN ([id number]) 09/30/2015 -- she has seen Dr. 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 because she is ambulatory at home health will not apply a wound VAC and she will have to come here 3 times a week on Monday Wednesday and Friday. 10/10/2015 -- she is here for a second application of Apligraf to her left lower extremity. 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 Jefm Bryant. Other than that she is doing well 10/31/2015 -- she is here for a third application of Apligraf to her left lower extremity. after reviewing her wound on the right side I noted that it is granulating extremely well and we will use the remnants of the Apligraf on the right leg. 11/14/2015 -- she was recently admitted to the hospital between January 11 and 11/10/2015 for uncontrolled hypertension, diabetes mellitus, acute kidney injury. during her admission she was supported by wound care and also by antibiotics. Around this time her immunosuppression was stopped by Dr. Nunzio Cory. She is feeling much better now. Objective Constitutional Pulse regular. Respirations normal and unlabored. Afebrile. Vitals Time Taken: 12:59 PM, Height: 65 in, Weight: 248 lbs, BMI: 41.3, Temperature: 98.2 F, Pulse: 88 bpm, Respiratory Rate: 18 breaths/min, Blood Pressure: 136/52 mmHg. Eyes Nonicteric. Reactive to  light. Ears, Nose, Mouth, and Throat Lips, teeth, and gums WNL.Marland Kitchen Moist mucosa without lesions. Neck supple and nontender. No palpable supraclavicular or cervical adenopathy. Normal sized without goiter. Respiratory WNL. No retractions.. Cardiovascular Pedal Pulses WNL. No clubbing, cyanosis or edema. Chest Breasts symmetical and no nipple discharge.. Breast tissue WNL, no masses, lumps, or tenderness.. Lymphatic No adneopathy. No adenopathy. No adenopathy. [name], [name] ([id number]) Musculoskeletal Adexa without tenderness or enlargement.. Digits and nails w/o clubbing, cyanosis, infection, petechiae, ischemia, or inflammatory conditions.Marland Kitchen Psychiatric Judgement and insight Intact.. No evidence of depression, anxiety, or agitation.. General Notes: the left lower extremity wound is looking excellent and has very little open area with healthy granulation tissue. This was covered with Apligraf in the usual fashion. The right leg was still a lateral area is again extremely clean and Apligraf was applied below the wound VAC. The medial wound on the right leg has significant amount of slough which was carefully sharply dissected skin and subcutaneous tissue with a forcep and 15 blade. Integumentary (Hair, Skin) No suspicious lesions. No crepitus or fluctuance. No peri-wound warmth or erythema. No masses.. Wound #2 status is Open. Original cause of wound was Blister. The wound is located on the Left,Distal Lower Leg. The wound measures 1.5cm length x 1.3cm width x 0.1cm depth; 1.532cm^2 area and 0.153cm^3 volume. The wound is limited to skin breakdown.  There is no tunneling or undermining noted. There is a large amount of serous drainage noted. The wound margin is thickened. There is medium (34- 66%) red, pink granulation within the wound bed. There is a medium (34-66%) amount of necrotic tissue within the wound bed including Adherent Slough. The periwound skin appearance exhibited:  Localized Edema, Scarring, Maceration, Moist. Periwound temperature was noted as No Abnormality. The periwound has tenderness on palpation. Wound #5 status is Open. Original cause of wound was Gradually Appeared. The wound is located on the Right,Posterior Lower Leg. The wound measures 5.4cm length x 8.3cm width x 0.3cm depth; 35.202cm^2 area and 10.56cm^3 volume. There is no tunneling or undermining noted. There is a large amount of serosanguineous drainage noted. The wound margin is thickened. There is large (67-100%) red, pink granulation within the wound bed. There is a small (1-33%) amount of necrotic tissue within the wound bed including Adherent Slough. The periwound skin appearance exhibited: Localized Edema, Scarring, Moist. Periwound temperature was noted as No Abnormality. The periwound has tenderness on palpation. Wound #6 status is Open. Original cause of wound was Gradually Appeared. The wound is located on the Right,Medial Lower Leg. The wound measures 6cm length x 4.3cm width x 0.3cm depth; 20.263cm^2 area and 6.079cm^3 volume. The wound is limited to skin breakdown. There is no tunneling or undermining noted. There is a medium amount of purulent drainage noted. The wound margin is distinct with the outline attached to the wound base. There is no granulation within the wound bed. There is a large (67-100%) amount of necrotic tissue within the wound bed including Eschar and Adherent Slough. The periwound skin appearance exhibited: Moist. The periwound skin appearance did not exhibit: Callus, Crepitus, Excoriation, Fluctuance, Friable, Induration, Localized Edema, Rash, Scarring, Dry/Scaly, Maceration, Atrophie Blanche, Cyanosis, Ecchymosis, Hemosiderin Staining, Mottled, Pallor, Rubor, Erythema. Periwound temperature was noted as No Abnormality. The periwound has tenderness on palpation. 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 The right lower extremity has a couple of areas which are new anterior medial to the old wound and this has some slough. We will use Santyl over this including application of the wound VAC as planned. the patient will be here next week for a wound check. Procedures Wound #6 Wound #6 is a Diabetic Wound/Ulcer of the Lower Extremity located on the Right,Medial Lower Leg . There was a Skin/Subcutaneous Tissue Debridement [id number]) debridement with total area of 25.8 sq cm performed by [name], MD. with the following instrument(s): Blade and Forceps to remove Viable and Non-Viable tissue/material including Fibrin/Slough, Eschar, and Subcutaneous after achieving pain control using Lidocaine 4% Topical Solution. A time out was conducted prior to the start of the procedure. A Minimum amount of bleeding was controlled with Pressure. The procedure was tolerated well with a pain level of 0 throughout and a pain level of 0 following the procedure. Post Debridement Measurements: 6cm length x 4.3cm  width x 0.3cm depth; 6.079cm^3 volume. Post procedure Diagnosis Wound #6: Same as Pre-Procedure General Notes: The left lower extremity wound is looking excellent and has very little open area with healthy granulation tissue. This was covered with Apligraf in the usual fashion. The right leg was still a lateral area is again extremely  clean and Apligraf was applied below the wound VAC. The medial wound on the right leg has significant amount of slough which was carefully sharply dissected skin and subcutaneous tissue with a forcep and 15 blade. This was a separate wound which was different from the one Apligraf was applied and this has been treated appropriately. Wound #2 Wound #2 is a Venous Leg Ulcer located on the Left,Distal Lower Leg. A skin graft procedure using a bioengineered skin substitute/cellular or tissue based product was performed by [name], MD. Apligraf was applied and secured with Steri-Strips. 2 sq cm of product was utilized and 42 sq cm was wasted due to [place]. ([id number]) wound size. Post Application, mepitel one was applied. A Time Out was conducted prior to the start of the procedure. The procedure was tolerated well with a pain level of 0 throughout and a pain level of 0 following the procedure. Post procedure Diagnosis Wound #2: Same as Pre-Procedure . Wound #5 Wound #5 is a Venous Leg Ulcer located on the Right,Posterior Lower Leg. A skin graft procedure using a bioengineered skin substitute/cellular or tissue based product was performed by [name], MD. Apligraf was applied and secured with Steri-Strips. 44 sq cm of product was utilized and 0 sq cm was wasted. Post Application, mepitel one was applied. A Time Out was conducted prior to the start of the procedure. The procedure was tolerated well with a pain level of 0 throughout and a pain level of 0 following the procedure. Post procedure Diagnosis Wound #5: Same as Pre-Procedure . Plan Wound Cleansing: Wound #2 Left,Distal Lower Leg: Cleanse wound with mild soap and water May shower with protection. Wound #5 Right,Posterior Lower Leg: Cleanse wound with mild soap and water May shower with protection. Wound #6 Right,Medial Lower Leg: Cleanse wound with mild soap and water May shower with protection. Skin  Barriers/Peri-Wound Care: Wound #2 Left,Distal Lower Leg: Skin Prep Wound #5 Right,Posterior Lower Leg: Skin Prep Wound #6 Right,Medial Lower Leg: Skin Prep Primary Wound Dressing: Wound #2 Left,Distal Lower Leg: Mepitel One - over apligraf Wound #5 Right,Posterior Lower Leg: Mepitel One - over apligraf Wound #6 Right,Medial Lower Leg: Dry Gauze Santyl Ointment XtraSorb Dressing Change Frequency: Wound #5 Right,Posterior Lower Leg: Change Dressing Monday, Wednesday, Friday SHAMIRRA, ARMSTEAD ([id number]) Change Dressing Monday, Wednesday, Friday Wound #6 Right,Medial Lower Leg: Change Dressing Monday, Wednesday, Friday Wound #2 Left,Distal Lower Leg: Change dressing every week Follow-up Appointments: Wound #2 Left,Distal Lower Leg: Return Appointment in 1 week. Other: - nurse visit Monday and Wednesday Wound #5 Right,Posterior Lower Leg: Return Appointment in 1 week. Other: - nurse visit Monday and Wednesday Wound #6 Right,Medial Lower Leg: Return Appointment in 1 week. Other: - nurse visit Monday and Wednesday Edema Control: Wound #2 Left,Distal Lower Leg: Patient to wear own compression stockings Negative Pressure Wound Therapy: Wound #5 Right,Posterior Lower Leg: Wound VAC settings at 125/130 mmHg continuous pressure. Use BLACK/GREEN foam to wound cavity. Use WHITE foam to fill any tunnel/s and/or undermining. Change VAC dressing 2 X WEEK. Change canister as indicated when full. Nurse may titrate settings and frequency of dressing changes as clinically indicated. Number of foam/gauze pieces used  in the dressing = - 1 Advanced Therapies: Wound #2 Left,Distal Lower Leg: Apligraf application in clinic; including contact layer, fixation with steri strips, dry gauze and cover dressing. Wound #5 Right,Posterior Lower Leg: Apligraf application in clinic; including contact layer, fixation with steri strips, dry gauze and cover dressing. Medications-please add to medication  list.: Wound #6 Right,Medial Lower Leg: Santyl Enzymatic Ointment The patient has had the fourth Apligraf applied to the left lower extremity and the rest of the Apligraf was applied to the right lower extremity. The right lower extremity has a couple of areas which are new anterior medial to the old wound and this has some slough. We will use Santyl over this including application of the wound VAC as planned. the patient will be here next week for a wound check. Electronic Signature(s) LYNLEIGH, LAMPORT ([id number]) Signed: 11/19/2015 2:27:58 PM By: [name], [name] Previous Signature: 11/19/2015 2:25:13 PM Version By: [name], [name] Entered By: [name] on 11/19/2015 14:27:58 Harcum, Yarnell D. ([id number]) -------------------------------------------------------------------------------- SuperBill Details Patient Name: KISSY, FELLER D. Date of Service: 11/19/2015 Medical Record Number: [id number] Patient Account Number: [id number] Date of Birth/Sex: February 21, 1946 (70 y.o. 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 L03.116 Cellulitis of left lower limb Facility Procedures CPT4: Description Modifier Quantity Code [id number] (Facility Use Only) Apligraf 1 SQ CM 88 CPT4: [id number] - SKIN SUB GRAFT FACE/NK/HF/G 1 ICD-10 Description Diagnosis 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 diabetes mellitus with other skin ulcer CPT4: [id number] 15276 - SKIN SUB GRAFT F/N/HF/G ADDL 1 ICD-10 Description Diagnosis 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 diabetes mellitus with other skin ulcer CPT4: [id number] - DEB SUBQ TISSUE 20 SQ CM/< 59 1 Description DYLYN, DREWEK ([id number]) Physician Procedures CPT4: Description Modifier Quantity Code [id number] - WC PHYS SKIN SUB GRAFT FACE/NK/HF/G 1 ICD-10 Description Diagnosis 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 I83.223 Varicose veins of left lower  extremity with both ulcer of ankle and inflammation E11.622 Type 2 diabetes mellitus with other skin ulcer CPT4: [id number] 15276 - WC PHYS SKN SUB GRAFT F/N/HF/G ADDL 1 ICD-10 Description Diagnosis 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 ([id number]) Electronic Signature(s) Signed: 11/19/2015 2:26:17 PM By:  [name], [name] Entered By: [name] on 11/19/2015 14:26:16

## 2015-11-21 DIAGNOSIS — E11622 Type 2 diabetes mellitus with other skin ulcer: Secondary | ICD-10-CM | POA: Diagnosis not present

## 2015-11-21 NOTE — Progress Notes (Signed)
SAMYRA, DIGAETANO ([id number]) Visit Report for 11/21/2015 Arrival Information Details Patient Name: ANIE, PLA 11/21/2015 10:30 Date of Service: AM Medical Record [id number] Number: Patient Account Number: [id number] [birth year] (70 y.o. Treating RN: [name] Date of Birth/Sex: Female) Other Clinician: Primary Care Physician: [facility], Delana Meyer Treating Britto, Errol Referring Physician: Gl[place]on Axe Physician/Extender: Weeks in Treatment: 2 Visit Information History Since Last Visit Added or deleted any medications: No Patient Arrived: Ambulatory Any new allergies or adverse reactions: No Arrival Time: 10:50 Had a fall or experienced change in No Accompanied By: self activities of daily living that may affect Transfer Assistance: None risk of falls: Patient Identification Verified: Yes Signs or symptoms of abuse/neglect since last No Secondary Verification Process Yes visito Completed: Hospitalized since last visit: No Patient Requires Transmission- No Pain Present Now: No Based Precautions: Patient Has Alerts: Yes Patient Alerts: Patient on Blood Thinner Electronic Signature(s) Signed: 11/21/2015 12:04:54 PM By: [name] Entered By: [name] on 11/21/2015 12:04:54 Renner, Tia Masker ([id number]) -------------------------------------------------------------------------------- Encounter Discharge Information Details Patient Name: IVONNE, HUSKEY D. 11/21/2015 10:30 Date of Service: AM Medical Record [id number] Number: Patient Account Number: [id number] 13-Aug-1946 (70 y.o. Treating RN: [name] Date of Birth/Sex: Female) Other Clinician: Primary Care Physician: [facility], Delana Meyer Treating [name] Referring Physician: [place], [place]: Weeks in Treatment: 52 Encounter Discharge Information Items Discharge Pain Level: 0 Discharge Condition: Stable Ambulatory Status: Ambulatory Discharge Destination:  Home Private Transportation: Auto Accompanied By: self Schedule Follow-up Appointment: Yes Medication Reconciliation completed and No provided to Patient/Care Daronte Shostak: Clinical Summary of Care: Electronic Signature(s) Signed: 11/21/2015 12:06:48 PM By: [name] Entered By: [name] on 11/21/2015 12:06:48 Soria, Tia Masker ([id number]) -------------------------------------------------------------------------------- Patient/Caregiver Education Details Patient Name: HAEDYN, HASSEL D. 11/21/2015 10:30 Date of Service: AM Medical Record [id number] Number: Patient Account Number: [id number] Mar 09, 1946 (70 y.o. Treating RN: [name] Date of Birth/Gender: Female) Other Clinician: Primary Care Physician: [facility], Delana Meyer Treating [name] Referring Physician: [place], [place]: [place] in Treatment: 37 Education Assessment Education Provided To: Patient Education Topics Provided Wound/Skin Impairment: Handouts: Other: LLE wound care if needed Methods: Explain/Verbal Responses: State content correctly Electronic Signature(s) Signed: 11/21/2015 12:06:32 PM By: [name] Entered By: [name] on 11/21/2015 12:06:32

## 2015-11-24 ENCOUNTER — Encounter: Payer: Commercial Managed Care - HMO | Admitting: Surgery

## 2015-11-24 DIAGNOSIS — E11622 Type 2 diabetes mellitus with other skin ulcer: Secondary | ICD-10-CM | POA: Diagnosis not present

## 2015-11-25 NOTE — Progress Notes (Signed)
BRIAN, ORREN ([id number]) Visit Report for 11/24/2015 Chief Complaint Document Details Patient Name: [name], [name] 11/24/2015 3:45 Date of Service: PM Medical Record [id number] Number: Patient Account Number: [id number] [birth year] (70 y.o. Treating RN: Macarthur Critchley Date of Birth/Sex: Female) Other Clinician: Primary Care Physician: [facility], Delana Meyer Treating [name] Referring Physician: Glendon Axe Physician/Extender: Weeks in Treatment: 20 Information Obtained from: Patient Chief Complaint Patient presents to the wound care center for a consult due non healing wound 70 year old patient comes with a history of having a ulcer on the left lower extremity for the past 4 weeks. she says she's had swelling of both lower extremities for about a year after she started having prednisone. 02/07/2015 -- her vascular appointments obtained were in the first and third week of June. she is able to go to [place] and we will try and get her some earlier appointments. Other than that nothing else has changed in her management. Electronic Signature(s) Signed: 11/24/2015 4:16:48 PM By: [name], [name] Entered By: [name] on 11/24/2015 16:16:47 Reedy, ISHYA SALIH ([id number]) -------------------------------------------------------------------------------- HPI Details Patient Name: [name], [name] 11/24/2015 3:45 Date of Service: PM Medical Record [id number] Number: Patient Account Number: [id number] 1945/11/12 (70 y.o. Treating RN: Macarthur Critchley Date of Birth/Sex: Female) Other Clinician: Primary Care Physician: [facility], Delana Meyer Treating Skylyn Slezak Referring Physician: Glendon Axe Physician/Extender: Weeks in Treatment: 38 History of Present Illness HPI Description: 70 year old patient who is known to have diabetes mellitus type 2, chronic renal insufficiency, coronary artery disease, hypertension, hypercholesterolemia, temporal arteritis and inflammatory  arthritiss also has a history of having a hysterectomy and some orthopedic related surgeries. The ulcer on the left lower extremity started off as a blister and then. Got progressively worse. She does not have any fever or chills and has not had any recent surgical intervention for this. Her last hemoglobin A1c was 10.1 in September 2015. She has been recently put on doxycycline by her PCP. She is now also allergic to doxycycline and was this was changed over to Keflex. 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 She does see a cardiologist and also takes a diuretic. 02/07/2015 her arterial and venous duplex studies to be done have dates been given as the first and third week of June. This is at [facility]. We are going to try and get early appointments at [facility]. other than that nothing has changed in her management. 02/14/2015 -- we have been able to get her an appointment in [facility] on May 20 which is much earlier than her previous ones at [place]. She continues with her prednisone and her sugars are in the range of 150-200. 02/21/2015 We were able to get a vascular lab workup for her today and she is going to be there at 2:00 this afternoon. the swelling of her leg has gone down significantly but she still has some tenderness over the wounds. 02/28/2015 - She has had one of two vascular workups done, and this coming Tuesday has another, at [facility]. She continues to be on steroid medications. She has significant sensitivity in her left lower extremity and has pain suggestive of neuropathic pain and I have asked her to address this with her primary care physician. 03/07/2015 -- The patient saw Dr. Lucky Cowboy for a consultation and he has had her arteries are okay but she has 2 incompetent veins on the  left lower extremity and he is going to set her up  for surgery. Official report is awaited. Addendum: Official reports are now available and on 03/04/2015. She was seen and lower extremity venous duplex exam was done. There was reflux present within the left greater saphenous vein below the knee and also the left small saphenous vein. Arterial duplex showed normal triphasic waveforms throughout the left lower extremity without any significant stenosis. Her ABIs were noncompressible bilaterally but a waveforms were normal and a digital pressures were normal bilaterally consistent with no significant arterial insufficiency. He has recommended endovenous ablation of both the left small saphenous and the left great saphenous vein. This would still be scheduled later. 03/14/2015 -- she has heard back from the vascular office and has surgery scheduled for sometime in July. [name], [name] ([id number]) Her rheumatologist has decreased her prednisone dosage but she still on it. She has also had cataract surgery in her right eye recently this week. 03/20/2015 - No new complaints today. Pain improved. No fever or chills. Tolerating 2 layer compression. 04/14/2015 -- she was doing very well today she went off on vacation and now her edema has increased markedly the ulceration is bigger and her diabetes is not under control. 04/21/2015 -- I spoke to her PCP Dr. Candiss Norse and discussed the management which would include being seen by a general surgeon for debridement and taking multiple punch biopsies which would help in establishing the diagnosis of this is a vasculitis. She is agreeable about this and will set her up for the procedure with Dr. Tamala Julian at [facility]. She was seen by the surgeon Dr. Jamal Collin. 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 She is scheduled to have laser ablation done next week.Pt was sent here for possible biopsy to  look for vasculitis. Feel it would be better to wait after laser ablation is completed- the ulcer may heal fully and biopsy may not be necessary 04/29/2015 -- she had the venous ablation done by Dr. Lucky Cowboy last Friday and we do not have any notes yet. She is doing fine otherwise. 05/06/2015 --Review of her recent vascular intervention shows that she was seen by Dr. Lucky Cowboy on 04/29/2015. The follow-up duplex which was done showed that both the great saphenous vein and the small saphenous vein remained patent with reflux consistent with an unsuccessful ablation. He has rescheduled her for another the endovenous ablation to be done in about 4 weekso time. 05/13/2015 -- he was seen by her surgeon Dr. Jamal Collin who asked her to continue with conservative therapy and he would speak to Dr. Lucky Cowboy about her management. Dr. Lucky Cowboy is going to schedule her surgery in the middle of August for a repeat endovenous ablation. Her pus culture from last week has grown : [place] her noted her sensitivity report but due to her multiple allergies I had tried clindamycin and she developed a rash with this too. She has been prescribed and anti-buttocks in the ER and is has it at home and she will let is know what she is going to be taking. 05/20/2015 -- she has developed a small spot on her right lower extremity but besides that it is not a full fledged ulceration. She did not get to see Dr. Lucky Cowboy last week and hopefully she will see him in the near future. 05/27/2015 -she is still awaiting her  appointment with Dr.Dew and her vascular procedure is not scheduled until August 19. She will be seeing her PCP tomorrow and I have asked her to convey our discussion so that she is aware that debridement has not been done yet. 06/03/2015 -- was seen by her rheumatologist Dr. Dorthula Matas, who has been treating her for temporal  arteritis and in his note has mentioned the possibility of vasculitis or pyoderma gangrenosum. He is lowering her prednisone to 12-1/2 mg for 1 month and then 10 mg per the next month. I will again make an attempt to speak to her PCP Dr. Candiss Norse and her surgeon Dr. 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 06/17/2015 -- Dr.Dew did her vascular procedure last week and a follow-up venous ultrasound shows good resolution of the veins as per the patient's history. He is to see her back in 2 weeks. ARETTA, FLASCH ([id number]) 07/07/2015. -- the patient has had a heavy growth of Proteus mirabilis and Enterococcus faecalis. These are sensitive to several drugs but the problem is she has allergies to all of these and hence I would like her to see Dr. Ola Spurr for this. She is also due to see Dr. Lucky Cowboy tomorrow and I will discussed the management with him including debridement under anesthesia and possible biopsies. 07/14/2015 -- she has an appointment to see Dr. Ola Spurr tomorrow and did see Dr. Bunnie Domino PA who will discuss my request with him. 07/21/2015 -- saw Dr. Ola Spurr was able to do a test on her and has put her on amoxicillin. She has been tolerating that and has had no problems with allergies to this. 07/28/2015 -- Last Friday I spoke to Dr. Leotis Pain regarding her care and he said that her right leg did not need any surgery and on the left leg was doing pretty good. We did agree that if she undergoes any procedure in the future he would do a couple of punch biopsies of the wound. 08/18/2015 -- her right leg is very tender and there is significant amount of slough. The left leg is looking much better 09/02/2015 -- she is going to have a debridement and punch biopsies of her right lower extremity by Dr. Leotis Pain this coming Thursday. Also seen Dr. Ola Spurr who has continued her on  ciprofloxacin. 09/09/2015 -- on 09/04/2015 Dr. Leotis Pain took her to surgery - Irrigation and excisional debridement of skin, soft tissue, and muscle to about 40 cm2 to the right posterior calf with biopsy. Pathology results are -- DIAGNOSIS: SKIN, RIGHT LOWER EXTREMITY; BIOPSY: SKIN AND SOFT TISSUE WITH ULCERATION, SEE NOTE. - NEGATIVE FOR DYSPLASIA AND MALIGNANCY. Note: There is acute inflammation and ulceration of the epidermis. The dermis shows nonspecific inflammation, neovascularization, and hemosiderin deposition. The differential diagnosis for these findings includes stasis dermatitis, nonspecific dermatitis, and infection. Correlation with clinical findings is required. A PAS fungal stain is obtained and results will be reported in an addendum. cultures were also sent and this grew Pseudomonas aeruginosa, Escherichia coli, Proteus mirabilis, Klebsiella pneumoniae and they were all sensitive to ciprofloxacin which she is on. 09/16/2015 -- he saw Dr. 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 He is going to call the patient in and discuss with her the management possibly with Imuran. I will continue treating  her locally. 09/23/2015 -- she saw Dr. Ola Spurr today who was going to continue the antibiotics for now and stop after this course. She has an appointment to see Dr. Jefm Bryant in about a week's time. Her wound VAC has arrived but she did not bring it with her today. We will try and set her up for changes to the right leg 3 times a week. She is here for her first application of Apligraf to the left lower extremity. 09/30/2015 -- she has seen Dr. 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 because she is ambulatory at home health will not apply a wound VAC and she will have  to come here 3 times a week on Monday Wednesday and Friday. 10/10/2015 -- she is here for a second application of Apligraf to her left lower extremity. 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 Jefm Bryant. Other than that she is doing well 10/31/2015 -- she is here for a third application of Apligraf to her left lower extremity. after reviewing her wound on the right side I noted that it is granulating extremely well and we will use the remnants of the Apligraf on the right leg. HARMONEI, CALZADO ([id number]) 11/14/2015 -- she was recently admitted to the hospital between January 11 and 11/10/2015 for uncontrolled hypertension, diabetes mellitus, acute kidney injury. during her admission she was supported by wound care and also by antibiotics. Around this time her immunosuppression was stopped by Dr. Nunzio Cory. She is feeling much better now. 11/24/2015 -- was here for a wound VAC change but it was noticed that she had profuse bleeding from the lobe medial part of her right lower extremity and I was called in to evaluate her. Electronic Signature(s) Signed: 11/24/2015 4:17:23 PM By: [name], [name] Entered By: [name] on 11/24/2015 16:17:22 Seeberger, MACKENNA SERRETTE ([id number]) -------------------------------------------------------------------------------- Otelia Sergeant TISS Details Patient Name: AMARILYS, MEEKER 11/24/2015 3:45 Date of Service: PM Medical Record [id number] Number: Patient Account Number: [id number] 03-12-1946 (70 y.o. Treating RN: Macarthur Critchley Date of Birth/Sex: Female) Other Clinician: Primary Care Physician: [facility], Delana Meyer Treating [name] Referring Physician: Glendon Axe Physician/Extender: Weeks in Treatment: 42 Procedure Performed for: Wound #6 Right,Medial Lower Leg Performed By: Physician [name], MD Post Procedure Diagnosis Same as Pre-procedure Notes the right medial  lower extremity wound which is covered with slough had a venous tributary which was bleeding and this had to be controlled with pressure and silver nitrate. I have recommended Aquacel Ag with some Mepitel to be packed into this wound and an appropriate dressing applied with mild compression with Kerlix and Coban. Electronic Signature(s) Signed: 11/24/2015 4:16:27 PM By: [name], [name] Entered By: [name] on 11/24/2015 16:16:27 NEOLA, DOCKWEILER ([id number]) -------------------------------------------------------------------------------- Physical Exam Details Patient Name: SUHAYLA, GASSAWAY 11/24/2015 3:45 Date of Service: PM Medical Record [id number] Number: Patient Account Number: [id number] 14-Aug-1946 (70 y.o. Treating RN: Macarthur Critchley Date of Birth/Sex: Female) Other Clinician: Primary Care Physician: [facility], Delana Meyer Treating [name] Referring Physician: Glendon Axe Physician/Extender: Weeks in Treatment: 42 Constitutional . Pulse regular. Respirations normal and unlabored. Afebrile. . Eyes Nonicteric. Reactive to light. Ears, Nose, Mouth, and Throat Lips, teeth, and gums WNL.Marland Kitchen Moist mucosa without lesions. Neck supple and nontender. No palpable supraclavicular or cervical adenopathy. Normal sized without goiter. Respiratory WNL. No retractions.. Cardiovascular Pedal Pulses WNL. No clubbing, cyanosis or edema.  Gastrointestinal (GI) Abdomen without masses or tenderness.. No liver or spleen enlargement or tenderness.. Genitourinary (GU) No hydrocele, spermatocele, tenderness of the cord, or testicular mass.Marland Kitchen Penis without lesions.Lowella Fairy without lesions. No cystocele, or rectocele. Pelvic support intact, no discharge.Marland Kitchen Urethra without masses, tenderness or scarring.Marland Kitchen Lymphatic No adneopathy. No adenopathy. No adenopathy. Musculoskeletal Adexa without tenderness or enlargement.. Digits and nails w/o clubbing, cyanosis, infection,  petechiae, ischemia, or inflammatory conditions.. Integumentary (Hair, Skin) No suspicious lesions. No crepitus or fluctuance. No peri-wound warmth or erythema. No masses.Marland Kitchen Psychiatric Judgement and insight Intact.. No evidence of depression, anxiety, or agitation.. Notes attention was given to the right lower extremity medial wound where she has significant slough which had been normally treated with Santyl. At the base of this ulcerated wound there was a small venule which was bleeding and this was controlled with Silver nitrate cauterization and pressure. There was no active bleeding. SHARA, HATHORNE ([id number]) Electronic Signature(s) Signed: 11/24/2015 4:18:42 PM By: [name], [name] Entered By: [name] on 11/24/2015 16:18:41 NATIKA, HEYEN ([id number]) -------------------------------------------------------------------------------- Physician Orders Details Patient Name: [name], [name] 11/24/2015 3:45 Date of Service: PM Medical Record [id number] Number: Patient Account Number: [id number] Nov 01, 1945 (70 y.o. Treating RN: Macarthur Critchley Date of Birth/Sex: Female) Other Clinician: Primary Care Physician: [facility], Delana Meyer Treating [name] Referring Physician: Glendon Axe Physician/Extender: Weeks in Treatment: 40 Verbal / Phone Orders: Yes Clinician: Macarthur Critchley Read Back and Verified: Yes Diagnosis Coding Wound Cleansing Wound #2 Left,Distal Lower Leg o Cleanse wound with mild soap and water o May shower with protection. Wound #5 Right,Posterior Lower Leg o Cleanse wound with mild soap and water o May shower with protection. Wound #6 Right,Medial Lower Leg o Cleanse wound with mild soap and water o May shower with protection. Skin Barriers/Peri-Wound Care Wound #2 Left,Distal Lower Leg o Skin Prep Wound #5 Right,Posterior Lower Leg o Skin Prep Wound #6 Right,Medial Lower Leg o Skin Prep Primary Wound Dressing Wound #2  Left,Distal Lower Leg o Mepitel One - over apligraf Wound #5 Right,Posterior Lower Leg o Drawtex o Mepitel One - over apligraf Wound #6 Right,Medial Lower Leg o Drawtex o Mepitel One Pelly, Afua D. ([id number]) Secondary Dressing Wound #2 Left,Distal Lower Leg o Dry Gauze o Boardered Foam Dressing Dressing Change Frequency Wound #2 Left,Distal Lower Leg o Change dressing every week Wound #5 Right,Posterior Lower Leg o Change Dressing Monday, Wednesday, Friday o Change Dressing Monday, Wednesday, Friday Wound #6 Right,Medial Lower Leg o Change Dressing Monday, Wednesday, Friday Follow-up Appointments Wound #2 Left,Distal Lower Leg o Return Appointment in 1 week. o Other: - nurse visit Monday and Wednesday Wound #5 Right,Posterior Lower Leg o Return Appointment in 1 week. o Other: - nurse visit Monday and Wednesday Wound #6 Right,Medial Lower Leg o Return Appointment in 1 week. o Other: - nurse visit Monday and Wednesday Edema Control Wound #2 Left,Distal Lower Leg o Patient to wear own compression stockings Wound #5 Right,Posterior Lower Leg o 2 Layer Compression System - Right Lower Extremity Wound #6 Right,Medial Lower Leg o 2 Layer Compression System - Right Lower Extremity Negative Pressure Wound Therapy Wound #5 Right,Posterior Lower Leg o Place NPWT on HOLD. Electronic Signature(s) Signed: 11/24/2015 4:26:31 PM By: [name], [name] Signed: 11/24/2015 4:43:04 PM By: Rebecca Eaton RN, Lamariah Hojnacki, Shaunice D. ([id number]) Entered By: Rebecca Eaton RN, [name] on 11/24/2015 16:14:19 CESLEY, BRACCIA ([id number]) -------------------------------------------------------------------------------- Problem List Details Patient Name: AALIANA, ASENCIO 11/24/2015 3:45 Date of Service: PM Medical Record [id number] Number: Patient Account  Number: [id number] 1946-01-11 (70 y.o. 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 01/31/2015 Yes exposed E66.01 Morbid (severe) obesity due to excess calories 01/31/2015 Yes I89.0 Lymphedema, not elsewhere classified 01/31/2015 Yes L03.116 Cellulitis of left lower limb 07/07/2015 Yes L97.212 Non-pressure chronic ulcer of right calf with fat layer 11/24/2015 Yes exposed I87.311 Chronic venous hypertension (idiopathic) with ulcer of 11/24/2015 Yes right lower extremity Inactive Problems Resolved Problems ICD-10 Code Description Active Date Resolved Date I83.222 Varicose veins of left lower extremity with both ulcer of 03/07/2015 03/07/2015 calf and inflammation LYDA, LUNDIN ([id number]) I83.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 with both ulcer of 03/07/2015 03/07/2015 ankle and inflammation Electronic Signature(s) Signed: 11/24/2015 4:22:35 PM By: [name], [name] Previous Signature: 11/24/2015 4:15:12 PM Version By: [name], [name] Entered By: [name] on 11/24/2015 16:22:35 Klindt, Narcisa DMarland Kitchen ([id number]) -------------------------------------------------------------------------------- Progress Note Details Patient Name: BELITA, CHRONIS D. 11/24/2015 3:45 Date of Service: PM Medical Record [id number] Number: Patient Account Number: [id number] Feb 25, 1946 (70 y.o. Treating RN: Macarthur Critchley Date of Birth/Sex: Female) Other Clinician: Primary Care Physician: [facility], Delana Meyer Treating [name] Referring Physician: Glendon Axe Physician/Extender: Weeks in Treatment: 42 Subjective Chief Complaint Information obtained from Patient Patient presents to the wound care center for a consult due  non healing wound 70 year old patient comes with a history of having a ulcer on the left lower extremity for the past 4 weeks. she says she's had swelling of both lower extremities for about a year after she started having prednisone. 02/07/2015 -- her vascular appointments obtained were in the first and third week of June. she is able to go to [place] and we will try and get her some earlier appointments. Other than that nothing else has changed in her management. History of Present Illness (HPI) 70 year old patient who is known to have diabetes mellitus type 2, chronic renal insufficiency, coronary artery disease, hypertension, hypercholesterolemia, temporal arteritis and inflammatory arthritiss also has a history of having a hysterectomy and some orthopedic related surgeries. The ulcer on the left lower extremity started off as a blister and then. Got progressively worse. She does not have any fever or chills and has not had any recent surgical intervention for this. Her last hemoglobin A1c was 10.1 in September 2015. She has been recently put on doxycycline by her PCP. She is now also allergic to doxycycline and was this was changed over to Keflex. 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 She does see a cardiologist and also takes a diuretic. 02/07/2015 her arterial and venous duplex studies to be done have dates been given as the first and third week of June. This is at [facility]. We are going to try and get early appointments at [facility]. other than that nothing has changed in her management. 02/14/2015 -- we have been able to get her an appointment in [facility] on May 20 which is much earlier than her previous ones at [place]. She continues with her prednisone and her sugars are in the range of 150-200. 02/21/2015 We were able to get a vascular lab workup for her  today and she is going to be there at 2:00 this afternoon. the swelling of her leg has gone down significantly but she still has some tenderness over the wounds. 02/28/2015 - She has had one of two vascular workups done, and this coming Tuesday has another, at [facility]. She continues to be on steroid medications. She has significant sensitivity in her left lower extremity and has pain suggestive of neuropathic pain and I have asked her to address this with her primary care physician. 03/07/2015 -- The patient saw Dr. Lucky Cowboy for a consultation and he has had her arteries are okay but she has 2 incompetent veins on the left lower extremity and he is going to set her up for surgery. Official report is JAZLYN, GERGELY ([id number]) awaited. Addendum: Official reports are now available and on 03/04/2015. She was seen and lower extremity venous duplex exam was done. There was reflux present within the left greater saphenous vein below the knee and also the left small saphenous vein. Arterial duplex showed normal triphasic waveforms throughout the left lower extremity without any significant stenosis. Her ABIs were noncompressible bilaterally but a waveforms were normal and a digital pressures were normal bilaterally consistent with no significant arterial insufficiency. He has recommended endovenous ablation of both the left small saphenous and the left great saphenous vein. This would still be scheduled later. 03/14/2015 -- she has heard back from the vascular office and has surgery scheduled for sometime in July. Her rheumatologist has decreased her prednisone dosage but she still on it. She has also had cataract surgery in her right eye recently this week. 03/20/2015 - No new complaints today. Pain improved. No fever or chills. Tolerating 2 layer compression. 04/14/2015 -- she was doing very well today she went off on vacation and now her edema has increased markedly the  ulceration is bigger and her diabetes is not under control. 04/21/2015 -- I spoke to her PCP Dr. Candiss Norse and discussed the management which would include being seen by a general surgeon for debridement and taking multiple punch biopsies which would help in establishing the diagnosis of this is a vasculitis. She is agreeable about this and will set her up for the procedure with Dr. Tamala Julian at [facility]. She was seen by the surgeon Dr. Jamal Collin. 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 She is scheduled to have laser ablation done next week.Pt was sent here for possible biopsy to look for vasculitis. Feel it would be better to wait after laser ablation is completed- the ulcer may heal fully and biopsy may not be necessary 04/29/2015 -- she had the venous ablation done by Dr. Lucky Cowboy last Friday and we do not have any notes yet. She is doing fine otherwise. 05/06/2015 --Review of her recent vascular intervention shows that she was seen by Dr. Lucky Cowboy on 04/29/2015. The follow-up duplex which was done showed that both the great saphenous vein and the small saphenous vein remained patent with reflux consistent with an unsuccessful ablation. He has rescheduled her for another the endovenous ablation to be done in about 4 weeks time. 05/13/2015 -- he was seen by her surgeon Dr. Jamal Collin who asked her to continue with conservative therapy and he would speak to Dr. Lucky Cowboy about her management. Dr. Lucky Cowboy is going to schedule her surgery in the middle of August for a repeat endovenous ablation. Her pus culture from last week  has grown : MODERATE GROWTH PROTEUS MIRABILIS;MODERATE GROWTH ESCHERICHIA COLI MODERATE GROWTH ENTEROCOCCUS FAECALIS her noted her sensitivity report but due to her multiple allergies I had tried clindamycin and she developed a rash with this too. She has been prescribed and  anti-buttocks in the ER and is has it at home and she will let is know what she is going to be taking. 05/20/2015 -- she has developed a small spot on her right lower extremity but besides that it is not a full fledged ulceration. She did not get to see Dr. Lucky Cowboy last week and hopefully she will see him in the near future. 05/27/2015 -she is still awaiting her appointment with Dr.Dew and her vascular procedure is not scheduled until August 19. She will be seeing her PCP tomorrow and I have asked her to convey our discussion so that she is aware that debridement has not been done yet. LEATHA, BUCKMASTER ([id number]) 06/03/2015 -- was seen by her rheumatologist Dr. 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 He is lowering her prednisone to 12-1/2 mg for 1 month and then 10 mg per the next month. I will again make an attempt to speak to her PCP Dr. Candiss Norse and her surgeon Dr. 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 06/17/2015 -- Dr.Dew did her vascular procedure last week and a follow-up venous ultrasound shows good resolution of the veins as per the patient's history. He is to see her back in 2 weeks. 07/07/2015. -- the patient has had a heavy growth of Proteus mirabilis and Enterococcus faecalis. These are sensitive to several drugs but the problem is she has allergies to all of these and hence I would like her to see Dr. Ola Spurr for this. She is also due to see Dr. Lucky Cowboy tomorrow and I will discussed the management with him including debridement under anesthesia and possible biopsies. 07/14/2015 -- she has an appointment to see Dr. Ola Spurr tomorrow and did see Dr. Bunnie Domino PA who will discuss my request with him. 07/21/2015 -- saw Dr. Ola Spurr was able to do a test on her and has  put her on amoxicillin. She has been tolerating that and has had no problems with allergies to this. 07/28/2015 -- Last Friday I spoke to Dr. Leotis Pain regarding her care and he said that her right leg did not need any surgery and on the left leg was doing pretty good. We did agree that if she undergoes any procedure in the future he would do a couple of punch biopsies of the wound. 08/18/2015 -- her right leg is very tender and there is significant amount of slough. The left leg is looking much better 09/02/2015 -- she is going to have a debridement and punch biopsies of her right lower extremity by Dr. Leotis Pain this coming Thursday. Also seen Dr. Ola Spurr who has continued her on ciprofloxacin. 09/09/2015 -- on 09/04/2015 Dr. Leotis Pain took her to surgery - Irrigation and excisional debridement of skin, soft tissue, and muscle to about 40 cm2 to the right posterior calf with biopsy. Pathology results are -- DIAGNOSIS: SKIN, RIGHT LOWER EXTREMITY; BIOPSY: SKIN AND SOFT TISSUE WITH ULCERATION, SEE NOTE. - NEGATIVE FOR DYSPLASIA AND MALIGNANCY. Note: There is acute inflammation and ulceration of the epidermis. The dermis shows nonspecific inflammation, neovascularization, and  hemosiderin deposition. The differential diagnosis for these findings includes stasis dermatitis, nonspecific dermatitis, and infection. Correlation with clinical findings is required. A PAS fungal stain is obtained and results will be reported in an addendum. cultures were also sent and this grew Pseudomonas aeruginosa, Escherichia coli, Proteus mirabilis, Klebsiella pneumoniae and they were all sensitive to ciprofloxacin which she is on. 09/16/2015 -- he saw Dr. 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 He is going to call the patient in and discuss with her the management possibly with Imuran. I  will continue treating her locally. 09/23/2015 -- she saw Dr. Ola Spurr today who was going to continue the antibiotics for now and stop after this course. She has an appointment to see Dr. Jefm Bryant in about a week's time. Her wound VAC has arrived but she did not bring it with her today. We will try and set her up for changes to the right leg 3 times a week. She is here for her first application of Apligraf to the left lower extremity. [name], [name] ([id number]) 09/30/2015 -- she has seen Dr. 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 because she is ambulatory at home health will not apply a wound VAC and she will have to come here 3 times a week on Monday Wednesday and Friday. 10/10/2015 -- she is here for a second application of Apligraf to her left lower extremity. 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 Jefm Bryant. Other than that she is doing well 10/31/2015 -- she is here for a third application of Apligraf to her left lower extremity. after reviewing her wound on the right side I noted that it is granulating extremely well and we will use the remnants of the Apligraf on the right leg. 11/14/2015 -- she was recently admitted to the hospital between January 11 and 11/10/2015 for uncontrolled hypertension, diabetes mellitus, acute kidney injury. during her admission she was supported by wound care and also by antibiotics. Around this time her immunosuppression was stopped by Dr. Nunzio Cory. She is feeling much better now. 11/24/2015 -- was here for a wound VAC change but it was noticed that she had profuse bleeding from the lobe medial part of her right lower extremity and I was called in to evaluate her. Objective Constitutional Pulse regular. Respirations normal and unlabored. Afebrile. Eyes Nonicteric. Reactive to light. Ears,  Nose, Mouth, and Throat Lips, teeth, and gums WNL.Marland Kitchen Moist mucosa without lesions. Neck supple and nontender. No palpable supraclavicular or cervical adenopathy. Normal sized without goiter. Respiratory WNL. No retractions.. Cardiovascular Pedal Pulses WNL. No clubbing, cyanosis or edema. Gastrointestinal (GI) Abdomen without masses or tenderness.. No liver or spleen enlargement or tenderness.. Genitourinary (GU) No hydrocele, spermatocele, tenderness of the cord, or testicular mass.Marland Kitchen Penis without lesions.Lowella Fairy without lesions. No cystocele, or rectocele. Pelvic support intact, no discharge.Marland Kitchen Urethra without masses, Kram, Ishitha D. ([id number]) tenderness or scarring.Marland Kitchen Lymphatic No adneopathy. No adenopathy. No adenopathy. Musculoskeletal Adexa without tenderness or enlargement.. Digits and nails w/o clubbing, cyanosis, infection, petechiae, ischemia, or inflammatory conditions.Marland Kitchen Psychiatric Judgement and insight Intact.. No evidence of depression, anxiety, or agitation.. General Notes: attention was given to the right lower extremity medial wound where she has significant slough which had been normally treated with Santyl. At the base  of this ulcerated wound there was a small venule which was bleeding and this was controlled with Silver nitrate cauterization and pressure. There was no active bleeding. Integumentary (Hair, Skin) No suspicious lesions. No crepitus or fluctuance. No peri-wound warmth or erythema. No masses.. Wound #2 status is Open. Original cause of wound was Blister. The wound is located on the Left,Distal Lower Leg. The wound measures 1.5cm length x 1.3cm width x 0.1cm depth; 1.532cm^2 area and 0.153cm^3 volume. The wound is limited to skin breakdown. There is no tunneling or undermining noted. There is a large amount of serous drainage noted. The wound margin is thickened. There is medium (34- 66%) red, pink granulation within the wound bed. There is a medium  (34-66%) amount of necrotic tissue within the wound bed including Adherent Slough. The periwound skin appearance exhibited: Localized Edema, Scarring, Maceration, Moist. Periwound temperature was noted as No Abnormality. The periwound has tenderness on palpation. Wound #5 status is Open. Original cause of wound was Gradually Appeared. The wound is located on the Right,Posterior Lower Leg. The wound measures 5.4cm length x 8.3cm width x 0.33cm depth; 35.202cm^2 area and 11.617cm^3 volume. There is no tunneling or undermining noted. There is a large amount of serosanguineous drainage noted. The wound margin is thickened. There is large (67-100%) red, pink granulation within the wound bed. There is a small (1-33%) amount of necrotic tissue within the wound bed including Adherent Slough. The periwound skin appearance exhibited: Localized Edema, Scarring, Moist. Periwound temperature was noted as No Abnormality. The periwound has tenderness on palpation. Wound #6 status is Open. Original cause of wound was Gradually Appeared. The wound is located on the Right,Medial Lower Leg. The wound measures 6cm length x 4.3cm width x 0.3cm depth; 20.263cm^2 area and 6.079cm^3 volume. The wound is limited to skin breakdown. There is no tunneling or undermining noted. There is a medium amount of purulent drainage noted. The wound margin is distinct with the outline attached to the wound base. There is no granulation within the wound bed. There is a large (67-100%) amount of necrotic tissue within the wound bed including Eschar and Adherent Slough. The periwound skin appearance exhibited: Moist. The periwound skin appearance did not exhibit: Callus, Crepitus, Excoriation, Fluctuance, Friable, Induration, Localized Edema, Rash, Scarring, Dry/Scaly, Maceration, Atrophie Blanche, Cyanosis, Ecchymosis, Hemosiderin Staining, Mottled, Pallor, Rubor, Erythema. Periwound temperature was noted as No Abnormality. The  periwound has tenderness on palpation. 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 The wound VAC will be discontinued for now. the posterior medial right of the wound where Apligraf has been applied will be dressed appropriately with a nonadherent dressing. She will be seen back for a wound visit on Wednesday and see me back on Friday. Care instructions have been given in case she has bleeding at this wound she should report to the ER immediately and may need to see Dr. Lucky Cowboy for further care. Procedures Wound #6  Wound #6 is a Diabetic Wound/Ulcer of the Lower Extremity located on the Right,Medial Lower Leg . An CHEM CAUT GRANULATION TISS procedure was performed by [name], MD. Post procedure Diagnosis Wound #6: Same as Pre-Procedure Notes: the right medial lower extremity wound which is covered with slough had a venous tributary which was bleeding and this had to be controlled with pressure and silver nitrate. I have recommended Aquacel Ag with some Mepitel to be packed into this wound and an appropriate dressing applied with mild compression with Kerlix and Coban. Plan Fukuhara, ATINA BARTOLOMEO. ([id number]) Wound Cleansing: Wound #2 Left,Distal Lower Leg: Cleanse wound with mild soap and water May shower with protection. Wound #5 Right,Posterior Lower Leg: Cleanse  wound with mild soap and water May shower with protection. Wound #6 Right,Medial Lower Leg: Cleanse wound with mild soap and water May shower with protection. Skin Barriers/Peri-Wound Care: Wound #2 Left,Distal Lower Leg: Skin Prep Wound #5 Right,Posterior Lower Leg: Skin Prep Wound #6 Right,Medial Lower Leg: Skin Prep Primary Wound Dressing: Wound #2 Left,Distal Lower Leg: Mepitel One - over apligraf Wound #5 Right,Posterior Lower Leg: Drawtex Mepitel One - over apligraf Wound #6 Right,Medial Lower Leg: Drawtex Mepitel One Secondary Dressing: Wound #2 Left,Distal Lower Leg: Dry Gauze Boardered Foam Dressing Dressing Change Frequency: Wound #2 Left,Distal Lower Leg: Change dressing every week Wound #5 Right,Posterior Lower Leg: Change Dressing Monday, Wednesday, Friday Change Dressing Monday, Wednesday, Friday Wound #6 Right,Medial Lower Leg: Change Dressing Monday, Wednesday, Friday Follow-up Appointments: Wound #2 Left,Distal Lower Leg: Return Appointment in 1 week. Other: - nurse visit Monday and Wednesday Wound #5 Right,Posterior Lower Leg: Return Appointment in 1 week. Other: - nurse visit Monday and Wednesday Wound #6 Right,Medial Lower Leg: Return Appointment in 1 week. Other: - nurse visit Monday and Wednesday Edema Control: Wound #2 Left,Distal Lower Leg: [name], Taneia D. ([id number]) Patient to wear own compression stockings Wound #5 Right,Posterior Lower Leg: 2 Layer Compression System - Right Lower Extremity Wound #6 Right,Medial Lower Leg: 2 Layer Compression System - Right Lower Extremity Negative Pressure Wound Therapy: Wound #5 Right,Posterior Lower Leg: Place NPWT on HOLD. After cauterizing the venule in the region of the ulcer on the right lower extremity have recommended Mepitel, Aquacel Ag and a pressure dressing to be covered with Kerlix and Coban. The wound VAC will be discontinued for now. the posterior medial right of the wound where  Apligraf has been applied will be dressed appropriately with a nonadherent dressing. She will be seen back for a wound visit on Wednesday and see me back on Friday. Care instructions have been given in case she has bleeding at this wound she should report to the ER immediately and may need to see Dr. Lucky Cowboy for further care. Electronic Signature(s) Signed: 11/24/2015 4:32:53 PM By: [name], [name] Previous Signature: 11/24/2015 4:32:41 PM Version By: [name], [name] Previous Signature: 11/24/2015 4:20:37 PM Version By: [name], [name] Entered By: [name] on 11/24/2015 16:32:53 Sipp, Maridel D. ([id number]) -------------------------------------------------------------------------------- SuperBill Details Patient Name: [name], [name] Date of Service: 11/24/2015 Medical Record Number: [id number] Patient Account Number: [id number] Date of Birth/Sex: Feb 14, 1946 (70 y.o. Female) Treating RN: Macarthur Critchley Primary Care Physician: Glendon Axe Other Clinician: Referring Physician: Glendon Axe Treating Physician/Extender: Frann Rider in Treatment: 42 Diagnosis Coding ICD-10 Codes Code Description E11.622 Type 2 diabetes mellitus with other skin ulcer L97.322 Non-pressure chronic ulcer of left ankle with fat layer exposed E66.01 Morbid (severe) obesity due to excess calories I89.0 Lymphedema,  not elsewhere classified L03.116 Cellulitis of left lower limb L97.212 Non-pressure chronic ulcer of right calf with fat layer exposed I87.311 Chronic venous hypertension (idiopathic) with ulcer of right lower extremity Facility Procedures CPT4 Code Description: [id number] - WOUND CARE VISIT-LEV 4 EST PT Modifier: Quantity: 1 CPT4 Code Description: [id number] - CHEM CAUT GRANULATION TISS ICD-10 Description Diagnosis E11.622 Type 2 diabetes mellitus with other skin ulcer I87.311 Chronic venous hypertension (idiopathic) with ulcer L97.212 Non-pressure chronic  ulcer of right  calf with fat la I89.0 Lymphedema, not elsewhere classified Modifier: of right lowe yer exposed Quantity: 1 r extremity Physician Procedures CPT4 Code Description: [id number] - WC PHYS CHEM CAUT GRAN TISSUE ICD-10 Description Diagnosis E11.622 Type 2 diabetes mellitus with other skin ulcer I87.311 Chronic venous hypertension (idiopathic) with ulcer L97.212 Non-pressure chronic ulcer of  right calf with fat la I89.0 Lymphedema, not elsewhere classified Modifier: of right lower yer exposed Quantity: 1 extremity Electronic Signature(s) BUSHRA, MOCK ([id number]) Signed: 11/24/2015 4:23:14 PM By: [name], [name] Previous Signature: 11/24/2015 4:21:30 PM Version By: [name], [name] Entered By: [name] on 11/24/2015 16:23:14

## 2015-11-25 NOTE — Progress Notes (Signed)
Kristin, [name] ([id number]) Visit Report for 11/24/2015 Arrival Information Details Patient Name: [name], [name] Date of Service: 11/24/2015 3:45 PM Medical Record Number: [id number] Patient Account Number: [id number] Date of Birth/Sex: Oct 22, 1946 (70 y.o. Female) Treating RN: Macarthur Critchley Primary Care Physician: Glendon Axe Other Clinician: Referring Physician: Glendon Axe Treating Physician/Extender: Frann Rider in Treatment: 65 Visit Information History Since Last Visit All ordered tests and consults were completed: No Patient Arrived: Ambulatory Added or deleted any medications: No Arrival Time: 15:45 Any new allergies or adverse reactions: No Accompanied By: self Had a fall or experienced change in No Transfer Assistance: None activities of daily living that may affect Patient Identification Verified: Yes risk of falls: Secondary Verification Process Yes Signs or symptoms of abuse/neglect since last No Completed: visito Patient Requires Transmission- No Hospitalized since last visit: No Based Precautions: Has Dressing in Place as Prescribed: Yes Patient Has Alerts: Yes Pain Present Now: Yes Patient Alerts: Patient on Blood Thinner Electronic Signature(s) Signed: 11/24/2015 4:43:04 PM By: Rebecca Eaton, RN, [name] Entered By: Rebecca Eaton RN, [name] on 11/24/2015 16:01:40 [name], [name] ([id number]) -------------------------------------------------------------------------------- Clinic Level of Care Assessment Details Patient Name: [name] Date of Service: 11/24/2015 3:45 PM Medical Record Number: [id number] Patient Account Number: [id number] Date of Birth/Sex: [birth year] (70 y.o. Female) Treating RN: Macarthur Critchley Primary Care Physician: Glendon Axe Other Clinician: Referring Physician: Glendon Axe Treating Physician/Extender: Frann Rider in Treatment: 42 Clinic Level of Care Assessment Items TOOL 4 Quantity Score []  - Use when  only an EandM is performed on FOLLOW-UP visit 0 ASSESSMENTS - Nursing Assessment / Reassessment X - Reassessment of Co-morbidities (includes updates in patient status) 1 10 X - Reassessment of Adherence to Treatment Plan 1 5 ASSESSMENTS - Wound and Skin Assessment / Reassessment []  - Simple Wound Assessment / Reassessment - one wound 0 X - Complex Wound Assessment / Reassessment - multiple wounds 3 5 []  - Dermatologic / Skin Assessment (not related to wound area) 0 ASSESSMENTS - Focused Assessment []  - Circumferential Edema Measurements - multi extremities 0 []  - Nutritional Assessment / Counseling / Intervention 0 []  - Lower Extremity Assessment (monofilament, tuning fork, pulses) 0 []  - Peripheral Arterial Disease Assessment (using hand held doppler) 0 ASSESSMENTS - Ostomy and/or Continence Assessment and Care []  - Incontinence Assessment and Management 0 []  - Ostomy Care Assessment and Management (repouching, etc.) 0 PROCESS - Coordination of Care X - Simple Patient / Family Education for ongoing care 1 15 []  - Complex (extensive) Patient / Family Education for ongoing care 0 []  - Staff obtains Programmer, systems, Records, Test Results / Process Orders 0 []  - Staff telephones HHA, Nursing Homes / Clarify orders / etc 0 []  - Routine Transfer to another Facility (non-emergent condition) 0 [name] ([id number]) []  - Routine Hospital Admission (non-emergent condition) 0 []  - New Admissions / Biomedical engineer / Ordering NPWT, Apligraf, etc. 0 []  - Emergency Hospital Admission (emergent condition) 0 X - Simple Discharge Coordination 1 10 []  - Complex (extensive) Discharge Coordination 0 PROCESS - Special Needs []  - Pediatric / Minor Patient Management 0 []  - Isolation Patient Management 0 []  - Hearing / Language / Visual special needs 0 []  - Assessment of Community assistance (transportation, [name], etc.) 0 []  - Additional assistance / Altered mentation 0 []  - Support  Surface(s) Assessment (bed, cushion, seat, etc.) 0 INTERVENTIONS - Wound Cleansing / Measurement []  - Simple Wound Cleansing - one wound 0 X - Complex Wound Cleansing - multiple wounds  3 5 X - Wound Imaging (photographs - any number of wounds) 1 5 []  - Wound Tracing (instead of photographs) 0 []  - Simple Wound Measurement - one wound 0 X - Complex Wound Measurement - multiple wounds 3 5 INTERVENTIONS - Wound Dressings []  - Small Wound Dressing one or multiple wounds 0 X - Medium Wound Dressing one or multiple wounds 3 15 []  - Large Wound Dressing one or multiple wounds 0 []  - Application of Medications - topical 0 []  - Application of Medications - injection 0 INTERVENTIONS - Miscellaneous []  - External ear exam 0 [name], [name] ([id number]) []  - Specimen Collection (cultures, biopsies, blood, body fluids, etc.) 0 []  - Specimen(s) / Culture(s) sent or taken to Lab for analysis 0 []  - Patient Transfer (multiple staff / [name]) 0 []  - Simple Staple / Suture removal (25 or less) 0 []  - Complex Staple / Suture removal (26 or more) 0 []  - Hypo / Hyperglycemic Management (close monitor of Blood Glucose) 0 []  - Ankle / Brachial Index (ABI) - do not check if billed separately 0 X - Vital Signs 1 5 Has the patient been seen at the hospital within the last three years: Yes Total Score: 140 Level Of Care: New/Established - Level 4 Electronic Signature(s) Signed: 11/24/2015 4:43:04 PM By: Rebecca Eaton, RN, [name] Entered By: Rebecca Eaton RN, [name] on 11/24/2015 16:16:36 [name], [name] ([id number]) -------------------------------------------------------------------------------- Encounter Discharge Information Details Patient Name: [name] Date of Service: 11/24/2015 3:45 PM Medical Record Number: [id number] Patient Account Number: [id number] Date of Birth/Sex: 08/05/1946 (70 y.o. Female) Treating RN: Macarthur Critchley Primary Care Physician: Glendon Axe Other  Clinician: Referring Physician: Glendon Axe Treating Physician/Extender: Frann Rider in Treatment: 15 Encounter Discharge Information Items Discharge Pain Level: 0 Discharge Condition: Stable Ambulatory Status: Cane Discharge Destination: Home Private Transportation: Auto Accompanied By: self Schedule Follow-up Appointment: Yes Medication Reconciliation completed and No provided to Patient/Care Keanu Lesniak: Clinical Summary of Care: Electronic Signature(s) Signed: 11/24/2015 4:43:04 PM By: Rebecca Eaton RN, [name] Entered By: Rebecca Eaton RN, [name] on 11/24/2015 16:18:44 Marshman, OdessaMarland Kitchen ([id number]) -------------------------------------------------------------------------------- Multi Wound Chart Details Patient Name: [name] Date of Service: 11/24/2015 3:45 PM Medical Record Number: [id number] Patient Account Number: [id number] Date of Birth/Sex: 1945/12/08 (70 y.o. Female) Treating RN: Macarthur Critchley Primary Care Physician: Glendon Axe Other Clinician: Referring Physician: Glendon Axe Treating Physician/Extender: Frann Rider in Treatment: 78 Photos: [2:No Photos] [5:No Photos] [6:No Photos] Wound Location: [2:Left Lower Leg - Distal] [5:Right Lower Leg - Posterior] [6:Right Lower Leg - Medial] Wounding Event: [2:Blister] [5:Gradually Appeared] [6:Gradually Appeared] Primary Etiology: [2:Venous Leg Ulcer] [5:Venous Leg Ulcer] [6:Diabetic Wound/Ulcer of the Lower Extremity] Comorbid History: [2:Cataracts, Asthma, Coronary Artery Disease, Coronary Artery Disease, Coronary Artery Disease, Hypertension, Type II Diabetes, Osteoarthritis, Diabetes, Osteoarthritis, Diabetes, Osteoarthritis, Neuropathy] [5:Cataracts, Asthma,  Hypertension, Type II Neuropathy] [6:Cataracts, Asthma, Hypertension, Type II Neuropathy] Date Acquired: [2:12/30/2014] [5:04/29/2015] [6:10/10/2015] Weeks of Treatment: [2:42] [5:26] [6:6] Wound Status: [2:Open] [5:Open] [6:Open] Clustered  Wound: [2:Yes] [5:No] [6:No] Measurements L x W x [name] 1.5x1.3x0.1 [5:5.4x8.3x0.33] [6:6x4.3x0.3] (cm) Area (cm) : [2:1.532] [5:35.202] [6:20.263] Volume (cm) : [2:0.153] [5:11.617] [6:6.079] % Reduction in Area: [2:-116.70%] [5:-18624.50%] [6:-486.30%] % Reduction in Volume: -115.50% [5:-61042.10%] [6:-1656.90%] Classification: [2:Full Thickness Without Exposed Support Structures] [5:Full Thickness Without Exposed Support Structures] [6:Grade 1] HBO Classification: [2:Grade 2] [5:Grade 2] [6:N/A] Exudate Amount: [2:Large] [5:Large] [6:Medium] Exudate Type: [2:Serous] [5:Serosanguineous] [6:Purulent] Exudate Color: [2:amber] [5:red, brown] [6:yellow, brown, green] Wound Margin: [2:Thickened] [5:Thickened] [6:Distinct, outline attached]  Granulation Amount: [2:Medium (34-66%)] [5:Large (67-100%)] [6:None Present (0%)] Granulation Quality: [2:Red, Pink] [5:Red, Pink] [6:N/A] Necrotic Amount: [2:Medium (34-66%)] [5:Small (1-33%)] [6:Large (67-100%)] Necrotic Tissue: [2:Adherent Slough] [5:Adherent Slough] [6:Eschar, Adherent Slough] Exposed Structures: [2:Fascia: No Fat: No Tendon: No Muscle: No Joint: No] [5:N/A] [6:Fascia: No Fat: No Tendon: No Muscle: No Joint: No] Bone: No Bone: No Limited to Skin Limited to Skin Breakdown Breakdown Epithelialization: None None None Periwound Skin Texture: Edema: Yes Edema: Yes Edema: No Scarring: Yes Scarring: Yes Excoriation: No Induration: No Callus: No Crepitus: No Fluctuance: No Friable: No Rash: No Scarring: No Periwound Skin Maceration: Yes Moist: Yes Moist: Yes Moisture: Moist: Yes Maceration: No Dry/Scaly: No Periwound Skin Color: No Abnormalities Noted No Abnormalities Noted Atrophie Blanche: No Cyanosis: No Ecchymosis: No Erythema: No Hemosiderin Staining: No Mottled: No Pallor: No Rubor: No Temperature: No Abnormality No Abnormality No Abnormality Tenderness on Yes Yes Yes Palpation: Wound Preparation: Ulcer  Cleansing: Ulcer Cleansing: Ulcer Cleansing: Rinsed/Irrigated with Rinsed/Irrigated with Rinsed/Irrigated with Saline Saline Saline Topical Anesthetic Topical Anesthetic Topical Anesthetic Applied: None Applied: None Applied: None Treatment Notes Electronic Signature(s) Signed: 11/24/2015 4:43:04 PM By: Rebecca Eaton, RN, [name] Entered By: Rebecca Eaton RN, [name] on 11/24/2015 16:11:19 Gerald, [name] ([id number]) -------------------------------------------------------------------------------- Multi-Disciplinary Care Plan Details Patient Name: CHARLYN, SOMERVILLE [name] Date of Service: 11/24/2015 3:45 PM Medical Record Number: [id number] Patient Account Number: [id number] Date of Birth/Sex: November 10, 1945 (70 y.o. Female) Treating RN: Macarthur Critchley Primary Care Physician: Glendon Axe Other Clinician: Referring Physician: Glendon Axe Treating Physician/Extender: Frann Rider in Treatment: 53 Active Inactive Orientation to the Wound Care Program Nursing Diagnoses: Knowledge deficit related to the wound healing center program Goals: Patient/caregiver will verbalize understanding of the [facility] Program Date Initiated: 01/31/2015 Goal Status: Active Interventions: Provide education on orientation to the wound center Notes: Venous Leg Ulcer Nursing Diagnoses: Potential for venous Insuffiency (use before diagnosis confirmed) Goals: Non-invasive venous studies are completed as ordered Date Initiated: 01/31/2015 Goal Status: Active Patient/caregiver will verbalize understanding of disease process and disease management Date Initiated: 01/31/2015 Goal Status: Active Interventions: Assess peripheral edema status every visit. Notes: Wound/Skin Impairment Nursing Diagnoses: Impaired tissue integrity Knowledge deficit related to smoking impact on wound healing [name], [name] ([id number]) Goals: Patient/caregiver will verbalize understanding of skin care regimen Date Initiated:  01/31/2015 Goal Status: Active Ulcer/skin breakdown will heal within 14 weeks Date Initiated: 01/31/2015 Goal Status: Active Interventions: Assess ulceration(s) every visit Notes: Electronic Signature(s) Signed: 11/24/2015 4:43:04 PM By: Rebecca Eaton, RN, [name] Entered By: Rebecca Eaton RN, [name] on 11/24/2015 16:11:10 [name], [name] ([id number]) -------------------------------------------------------------------------------- Pain Assessment Details Patient Name: [name] Date of Service: 11/24/2015 3:45 PM Medical Record Number: [id number] Patient Account Number: [id number] Date of Birth/Sex: January 28, 1946 (70 y.o. Female) Treating RN: Macarthur Critchley Primary Care Physician: Glendon Axe Other Clinician: Referring Physician: Glendon Axe Treating Physician/Extender: Frann Rider in Treatment: 42 Active Problems Location of Pain Severity and Description of Pain Patient Has Paino Yes Site Locations Pain Location: Pain in Ulcers With Dressing Change: Yes Duration of the Pain. Constant / Intermittento Intermittent Rate the pain. Current Pain Level: 4 Pain Management and Medication Current Pain Management: Electronic Signature(s) Signed: 11/24/2015 4:43:04 PM By: Rebecca Eaton, RN, [name] Entered By: Rebecca Eaton RN, [name] on 11/24/2015 16:01:57 [name], [name] ([id number]) -------------------------------------------------------------------------------- Patient/Caregiver Education Details Patient Name: [name] Date of Service: 11/24/2015 3:45 PM Medical Record Number: [id number] Patient Account Number: [id number] Date of Birth/Gender: 10-11-1946 (70 y.o. Female) Treating RN: Macarthur Critchley Primary Care Physician: Glendon Axe Other  Clinician: Referring Physician: Glendon Axe Treating Physician/Extender: Frann Rider in Treatment: 71 Education Assessment Education Provided To: Patient Education Topics Provided Safety: Handouts: Other: what to do if  bleeding happens again Methods: Explain/Verbal Responses: State content correctly Electronic Signature(s) Signed: 11/24/2015 4:43:04 PM By: Rebecca Eaton, RN, [name] Entered By: Rebecca Eaton RN, [name] on 11/24/2015 16:19:05 [name], [name] ([id number]) -------------------------------------------------------------------------------- Wound Assessment Details Patient Name: [name] Date of Service: 11/24/2015 3:45 PM Medical Record Number: [id number] Patient Account Number: [id number] Date of Birth/Sex: Dec 23, 1945 (70 y.o. Female) Treating RN: Macarthur Critchley Primary Care Physician: Glendon Axe Other Clinician: Referring Physician: Glendon Axe Treating Physician/Extender: Frann Rider in Treatment: 42 Wound Status Wound Number: 2 Primary Venous Leg Ulcer Etiology: Wound Location: Left Lower Leg - Distal Wound Open Wounding Event: Blister Status: Date Acquired: 12/30/2014 Comorbid Cataracts, Asthma, Coronary Artery Weeks Of Treatment: 42 History: Disease, Hypertension, Type II Clustered Wound: Yes Diabetes, Osteoarthritis, Neuropathy Wound Measurements Length: (cm) 1.5 Width: (cm) 1.3 Depth: (cm) 0.1 Area: (cm) 1.532 Volume: (cm) 0.153 % Reduction in Area: -116.7% % Reduction in Volume: -115.5% Epithelialization: None Tunneling: No Undermining: No Wound Description Full Thickness Without Classification: Exposed Support Structures Diabetic Severity Grade 2 (Wagner): Wound Margin: Thickened Exudate Amount: Large Exudate Type: Serous Exudate Color: amber Foul Odor After Cleansing: No Wound Bed Granulation Amount: Medium (34-66%) Exposed Structure Granulation Quality: Red, Pink Fascia Exposed: No Necrotic Amount: Medium (34-66%) Fat Layer Exposed: No Necrotic Quality: Adherent Slough Tendon Exposed: No Muscle Exposed: No Joint Exposed: No Bone Exposed: No Limited to Skin Breakdown Periwound Skin Texture Texture Color No Abnormalities Noted:  No No Abnormalities Noted: No [name], [name] ([id number]) Localized Edema: Yes Temperature / Pain Scarring: Yes Temperature: No Abnormality Tenderness on Palpation: Yes Moisture No Abnormalities Noted: No Maceration: Yes Moist: Yes Wound Preparation Ulcer Cleansing: Rinsed/Irrigated with Saline Topical Anesthetic Applied: None Treatment Notes Wound #2 (Left, Distal Lower Leg) 5. Secondary Dressing Applied Bordered Foam Dressing Dry Gauze Electronic Signature(s) Signed: 11/24/2015 4:43:04 PM By: Rebecca Eaton, RN, [name] Entered By: Rebecca Eaton RN, [name] on 11/24/2015 16:08:26 [name], [name] Kitchen ([id number]) -------------------------------------------------------------------------------- Wound Assessment Details Patient Name: DONA, TOMASSO [name] Date of Service: 11/24/2015 3:45 PM Medical Record Number: [id number] Patient Account Number: [id number] Date of Birth/Sex: December 22, 1945 (70 y.o. Female) Treating RN: Macarthur Critchley Primary Care Physician: Glendon Axe Other Clinician: Referring Physician: Glendon Axe Treating Physician/Extender: Frann Rider in Treatment: 42 Wound Status Wound Number: 5 Primary Venous Leg Ulcer Etiology: Wound Location: Right Lower Leg - Posterior Wound Open Wounding Event: Gradually Appeared Status: Date Acquired: 04/29/2015 Comorbid Cataracts, Asthma, Coronary Artery Weeks Of Treatment: 26 History: Disease, Hypertension, Type II Clustered Wound: No Diabetes, Osteoarthritis, Neuropathy Wound Measurements Length: (cm) 5.4 Width: (cm) 8.3 Depth: (cm) 0.33 Area: (cm) 35.202 Volume: (cm) 11.617 % Reduction in Area: -18624.5% % Reduction in Volume: -61042.1% Epithelialization: None Tunneling: No Undermining: No Wound Description Full Thickness Without Exposed Foul Odor A Classification: Support Structures Diabetic Severity Grade 2 (Wagner): Wound Margin: Thickened Exudate Amount: Large Exudate Type: Serosanguineous Exudate Color:  red, brown fter Cleansing: No Wound Bed Granulation Amount: Large (67-100%) Granulation Quality: Red, Pink Necrotic Amount: Small (1-33%) Necrotic Quality: Adherent Slough Periwound Skin Texture Texture Color No Abnormalities Noted: No No Abnormalities Noted: No Localized Edema: Yes Temperature / Pain Scarring: Yes Temperature: No Abnormality Moisture Tenderness on Palpation: Yes No Abnormalities Noted: No [name], [name] ([id number]) Moist: Yes Wound Preparation Ulcer Cleansing: Rinsed/Irrigated with Saline Topical Anesthetic Applied: None Treatment Notes Wound #5 (Right, Posterior  Lower Leg) 4. Dressing Applied: Mepitel Other dressing (specify in notes) 5. Secondary Dressing Applied Kerlix/Conform Notes drawtex, kerlix and coban from toes to 3cm below the knee Electronic Signature(s) Signed: 11/24/2015 4:43:04 PM By: Rebecca Eaton, RN, [name] Entered By: Rebecca Eaton RN, [name] on 11/24/2015 16:08:04 [name], [name] Kitchen ([id number]) -------------------------------------------------------------------------------- Wound Assessment Details Patient Name: ADDISYN, DEMETRIUS [name] Date of Service: 11/24/2015 3:45 PM Medical Record Number: [id number] Patient Account Number: [id number] Date of Birth/Sex: 1946/06/02 (70 y.o. Female) Treating RN: Macarthur Critchley Primary Care Physician: Glendon Axe Other Clinician: Referring Physician: Glendon Axe Treating Physician/Extender: Frann Rider in Treatment: 42 Wound Status Wound Number: 6 Primary Diabetic Wound/Ulcer of the Lower Etiology: Extremity Wound Location: Right Lower Leg - Medial Wound Open Wounding Event: Gradually Appeared Status: Date Acquired: 10/10/2015 Comorbid Cataracts, Asthma, Coronary Artery Weeks Of Treatment: 6 History: Disease, Hypertension, Type II Clustered Wound: No Diabetes, Osteoarthritis, Neuropathy Wound Measurements Length: (cm) 6 Width: (cm) 4.3 Depth: (cm) 0.3 Area: (cm) 20.263 Volume: (cm)  6.079 % Reduction in Area: -486.3% % Reduction in Volume: -1656.9% Epithelialization: None Tunneling: No Undermining: No Wound Description Classification: Grade 1 Wound Margin: Distinct, outline attached Exudate Amount: Medium Exudate Type: Purulent Exudate Color: yellow, brown, green Foul Odor After Cleansing: No Wound Bed Granulation Amount: None Present (0%) Exposed Structure Necrotic Amount: Large (67-100%) Fascia Exposed: No Necrotic Quality: Eschar, Adherent Slough Fat Layer Exposed: No Tendon Exposed: No Muscle Exposed: No Joint Exposed: No Bone Exposed: No Limited to Skin Breakdown Periwound Skin Texture Texture Color No Abnormalities Noted: No No Abnormalities Noted: No Callus: No Atrophie Blanche: No Crepitus: No Cyanosis: No Excoriation: No Ecchymosis: No Galeas, Gypsy [name] ([id number]) Fluctuance: No Erythema: No Friable: No Hemosiderin Staining: No Induration: No Mottled: No Localized Edema: No Pallor: No Rash: No Rubor: No Scarring: No Temperature / Pain Moisture Temperature: No Abnormality No Abnormalities Noted: No Tenderness on Palpation: Yes Dry / Scaly: No Maceration: No Moist: Yes Wound Preparation Ulcer Cleansing: Rinsed/Irrigated with Saline Topical Anesthetic Applied: None Treatment Notes Wound #6 (Right, Medial Lower Leg) 4. Dressing Applied: Aquacel Ag Mepitel 5. Secondary Dressing Applied Dry Gauze Notes kerlix and coban from toes to 3cm below the knee Electronic Signature(s) Signed: 11/24/2015 4:43:04 PM By: Rebecca Eaton RN, [name] Entered By: Rebecca Eaton RN, [name] on 11/24/2015 16:09:01

## 2015-11-26 ENCOUNTER — Encounter: Payer: Commercial Managed Care - HMO | Attending: Internal Medicine

## 2015-11-26 DIAGNOSIS — L03116 Cellulitis of left lower limb: Secondary | ICD-10-CM | POA: Insufficient documentation

## 2015-11-26 DIAGNOSIS — I890 Lymphedema, not elsewhere classified: Secondary | ICD-10-CM | POA: Diagnosis not present

## 2015-11-26 DIAGNOSIS — N189 Chronic kidney disease, unspecified: Secondary | ICD-10-CM | POA: Diagnosis not present

## 2015-11-26 DIAGNOSIS — I2510 Atherosclerotic heart disease of native coronary artery without angina pectoris: Secondary | ICD-10-CM | POA: Insufficient documentation

## 2015-11-26 DIAGNOSIS — I129 Hypertensive chronic kidney disease with stage 1 through stage 4 chronic kidney disease, or unspecified chronic kidney disease: Secondary | ICD-10-CM | POA: Diagnosis not present

## 2015-11-26 DIAGNOSIS — E11622 Type 2 diabetes mellitus with other skin ulcer: Secondary | ICD-10-CM | POA: Diagnosis present

## 2015-11-26 DIAGNOSIS — L97322 Non-pressure chronic ulcer of left ankle with fat layer exposed: Secondary | ICD-10-CM | POA: Diagnosis not present

## 2015-11-26 DIAGNOSIS — E7800 Pure hypercholesterolemia, unspecified: Secondary | ICD-10-CM | POA: Diagnosis not present

## 2015-11-26 DIAGNOSIS — I87311 Chronic venous hypertension (idiopathic) with ulcer of right lower extremity: Secondary | ICD-10-CM | POA: Diagnosis not present

## 2015-11-26 DIAGNOSIS — L97212 Non-pressure chronic ulcer of right calf with fat layer exposed: Secondary | ICD-10-CM | POA: Diagnosis not present

## 2015-11-26 DIAGNOSIS — E1122 Type 2 diabetes mellitus with diabetic chronic kidney disease: Secondary | ICD-10-CM | POA: Insufficient documentation

## 2015-11-28 ENCOUNTER — Encounter: Payer: Commercial Managed Care - HMO | Admitting: Surgery

## 2015-11-28 DIAGNOSIS — E11622 Type 2 diabetes mellitus with other skin ulcer: Secondary | ICD-10-CM | POA: Diagnosis not present

## 2015-11-28 NOTE — Progress Notes (Signed)
[name], [name] ([id number]) Visit Report for 11/26/2015 Arrival Information Details Patient Name: [name], [name] Date of Service: 11/26/2015 3:30 PM Medical Record Patient Account Number: [id number] Number: Treating RN: [name] 07-May-1946 (69 y.o. Other Clinician: Date of Birth/Sex: Female) Treating ROBSON, [name] Primary Care Physician/Extender: Glynn Octave, [place] Physician: Referring Physician: Glendon Axe Weeks in Treatment: 107 Visit Information History Since Last Visit Added or deleted any medications: No Patient Arrived: Cane Any new allergies or adverse reactions: No Arrival Time: 16:00 Had a fall or experienced change in No Accompanied By: self activities of daily living that may affect Transfer Assistance: None risk of falls: Patient Identification Verified: Yes Signs or symptoms of abuse/neglect since last No Secondary Verification Process Yes visito Completed: Hospitalized since last visit: No Patient Requires Transmission- No Pain Present Now: No Based Precautions: Patient Has Alerts: Yes Patient Alerts: Patient on Blood Thinner Electronic Signature(s) Signed: 11/26/2015 5:13:21 PM By: [name] Entered By: [name] on 11/26/2015 16:13:42 [name], [name] ([id number]) -------------------------------------------------------------------------------- Clinic Level of Care Assessment Details Patient Name: Kristin Stare D. Date of Service: 11/26/2015 3:30 PM Medical Record Patient Account Number: [id number] Number: Treating RN: [name] [birth year] (69 y.o. Other Clinician: Date of Birth/Sex: Female) Treating ROBSON, [name] Primary Care Physician/Extender: Glynn Octave, [place] Physician: Referring Physician: Glendon Axe Weeks in Treatment: 42 Clinic Level of Care Assessment Items TOOL 4 Quantity Score []  - Use when only an EandM is performed on FOLLOW-UP visit 0 ASSESSMENTS - Nursing Assessment / Reassessment X -  Reassessment of Co-morbidities (includes updates in patient status) 1 10 X - Reassessment of Adherence to Treatment Plan 1 5 ASSESSMENTS - Wound and Skin Assessment / Reassessment []  - Simple Wound Assessment / Reassessment - one wound 0 []  - Complex Wound Assessment / Reassessment - multiple wounds 0 []  - Dermatologic / Skin Assessment (not related to wound area) 0 ASSESSMENTS - Focused Assessment []  - Circumferential Edema Measurements - multi extremities 0 []  - Nutritional Assessment / Counseling / Intervention 0 []  - Lower Extremity Assessment (monofilament, tuning fork, pulses) 0 []  - Peripheral Arterial Disease Assessment (using hand held doppler) 0 ASSESSMENTS - Ostomy and/or Continence Assessment and Care []  - Incontinence Assessment and Management 0 []  - Ostomy Care Assessment and Management (repouching, etc.) 0 PROCESS - Coordination of Care X - Simple Patient / Family Education for ongoing care 1 15 []  - Complex (extensive) Patient / Family Education for ongoing care 0 []  - Staff obtains Consents, Records, Test Results / Process Orders 0 [name], [name] ([id number]) []  - Staff telephones HHA, Nursing Homes / Clarify orders / etc 0 []  - Routine Transfer to another Facility (non-emergent condition) 0 []  - Routine Hospital Admission (non-emergent condition) 0 []  - New Admissions / Biomedical engineer / Ordering NPWT, Apligraf, etc. 0 []  - Emergency Hospital Admission (emergent condition) 0 X - Simple Discharge Coordination 1 10 []  - Complex (extensive) Discharge Coordination 0 PROCESS - Special Needs []  - Pediatric / Minor Patient Management 0 []  - Isolation Patient Management 0 []  - Hearing / Language / Visual special needs 0 []  - Assessment of Community assistance (transportation, D/C planning, etc.) 0 []  - Additional assistance / Altered mentation 0 []  - Support Surface(s) Assessment (bed, cushion, seat, etc.) 0 INTERVENTIONS - Wound Cleansing / Measurement []  -  Simple Wound Cleansing - one wound 0 X - Complex Wound Cleansing - multiple wounds 2 5 []  - Wound Imaging (photographs - any number of wounds) 0 []  - Wound  Tracing (instead of photographs) 0 []  - Simple Wound Measurement - one wound 0 []  - Complex Wound Measurement - multiple wounds 0 INTERVENTIONS - Wound Dressings []  - Small Wound Dressing one or multiple wounds 0 X - Medium Wound Dressing one or multiple wounds 2 15 []  - Large Wound Dressing one or multiple wounds 0 []  - Application of Medications - topical 0 []  - Application of Medications - injection 0 [name], [name] ([id number]) INTERVENTIONS - Miscellaneous []  - External ear exam 0 []  - Specimen Collection (cultures, biopsies, blood, body fluids, etc.) 0 []  - Specimen(s) / Culture(s) sent or taken to Lab for analysis 0 []  - Patient Transfer (multiple staff / [name]) 0 []  - Simple Staple / Suture removal (25 or less) 0 []  - Complex Staple / Suture removal (26 or more) 0 []  - Hypo / Hyperglycemic Management (close monitor of Blood Glucose) 0 []  - Ankle / Brachial Index (ABI) - do not check if billed separately 0 []  - Vital Signs 0 Has the patient been seen at the hospital within the last three years: Yes Total Score: 80 Level Of Care: New/Established - Level 3 Electronic Signature(s) Signed: 11/26/2015 4:24:00 PM By: [name] Entered By: [name] on 11/26/2015 16:23:59 [name], [name] ([id number]) -------------------------------------------------------------------------------- Encounter Discharge Information Details Patient Name: [name], [name] Date of Service: 11/26/2015 3:30 PM Medical Record Patient Account Number: [id number] Number: Treating RN: [name] [birth year] (69 y.o. Other Clinician: Date of Birth/Sex: Female) Treating ROBSON, [name] Primary Care Physician/Extender: Glynn Octave, [place] Physician: Referring Physician: Glendon Axe Weeks in Treatment:  31 Encounter Discharge Information Items Discharge Pain Level: 0 Discharge Condition: Stable Ambulatory Status: Cane Discharge Destination: Home Private Transportation: Auto Accompanied By: self Schedule Follow-up Appointment: Yes Medication Reconciliation completed and No provided to Patient/Care [name]: Clinical Summary of Care: Electronic Signature(s) Signed: 11/26/2015 4:23:15 PM By: [name] Entered By: [name] on 11/26/2015 16:23:15 [name], [name] ([id number]) -------------------------------------------------------------------------------- Patient/Caregiver Education Details Patient Name: Kristin Stare D. Date of Service: 11/26/2015 3:30 PM Medical Record Patient Account Number: [id number] Number: Treating RN: [name] 1946/04/19 (69 y.o. Other Clinician: Date of Birth/Gender: Female) Treating ROBSON, [name] Primary Care Physician/Extender: Glynn Octave, [place] Physician: Weeks in Treatment: 42 Referring Physician: Glendon Axe Education Assessment Education Provided To: Patient Education Topics Provided Safety: Handouts: Other: bleeding precautions Methods: Explain/Verbal Responses: State content correctly Electronic Signature(s) Signed: 11/26/2015 4:22:40 PM By: [name] Entered By: [name] on 11/26/2015 16:22:39

## 2015-11-30 NOTE — Progress Notes (Signed)
Kristin, [name] ([id number]) Visit Report for 11/28/2015 Chief Complaint Document Details Patient Name: [name], [name] Date of Service: 11/28/2015 9:30 AM Medical Record Number: [id number] Patient Account Number: [id number] Date of Birth/Sex: 1946/06/13 (70 y.o. Female) Treating RN: [name] Primary Care Physician: [name] Other Clinician: Referring Physician: Glendon [name] Treating Physician/Extender: [name] in Treatment: 60 Information Obtained from: Patient Chief Complaint Patient presents to the wound care center for a consult due non healing wound 70 year old patient comes with a history of having a ulcer on the left lower extremity for the past 4 weeks. she says she's had swelling of both lower extremities for about a year after she started having prednisone. 02/07/2015 -- her vascular appointments obtained were in the first and third week of June. she is able to go to [place] and we will try and get her some earlier appointments. Other than that nothing else has changed in her management. Electronic Signature(s) Signed: 11/28/2015 10:29:17 AM By: Kristin Fudge MD, [name] Entered By: [name] on 11/28/2015 10:29:17 [place], [name] ([id number]) -------------------------------------------------------------------------------- HPI Details Patient Name: [name], [name] Date of Service: 11/28/2015 9:30 AM Medical Record Number: [id number] Patient Account Number: [id number] Date of Birth/Sex: 12-03-1945 (70 y.o. Female) Treating RN: [name] Primary Care Physician: [name] Other Clinician: Referring Physician: Glendon [name] Treating Physician/Extender: [name] in Treatment: 63 History of Present Illness HPI Description: 70 year old patient who is known to have diabetes mellitus type 2, chronic renal insufficiency, coronary artery disease, hypertension, hypercholesterolemia, temporal arteritis and inflammatory arthritiss also has a  history of having a hysterectomy and some orthopedic related surgeries. The ulcer on the left lower extremity started off as a blister and then. Got progressively worse. She does not have any fever or chills and has not had any recent surgical intervention for this. Her last hemoglobin A1c was 10.1 in September 2015. She has been recently put on doxycycline by her PCP. She is now also allergic to doxycycline and was this was changed over to Keflex. 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 She does see a cardiologist and also takes a diuretic. 02/07/2015 her arterial and venous duplex studies to be done have dates been given as the first and third week of June. This is at [facility] We are going to try and get early appointments at [name]. other than that nothing has changed in her management. 02/14/2015 -- we have been able to get her an appointment in [facility] on May 20 which is much earlier than her previous ones at [place]. She continues with her prednisone and her sugars are in the range of 150-200. 02/21/2015 We were able to get a vascular lab workup for her today and she is going to be there at 2:00 this afternoon. the swelling of her leg has gone down significantly but she still has some tenderness over the wounds. 02/28/2015 - She has had one of two vascular workups done, and this coming Tuesday has another, at [facility]. She continues to be on steroid medications. She has significant sensitivity in her left lower extremity and has pain suggestive of neuropathic pain and I have asked her to address this with her primary care physician. 03/07/2015 -- The patient saw [name] for a consultation and he has had her arteries are okay but she has 2 incompetent veins on the  left lower extremity and he is going to set her up for surgery. Official  report is awaited. Addendum: Official reports are now available and on 03/04/2015. She was seen and lower extremity venous duplex exam was done. There was reflux present within the left greater saphenous vein below the knee and also the left small saphenous vein. Arterial duplex showed normal triphasic waveforms throughout the left lower extremity without any significant stenosis. Her ABIs were noncompressible bilaterally but a waveforms were normal and a digital pressures were normal bilaterally consistent with no significant arterial insufficiency. He has recommended endovenous ablation of both the left small saphenous and the left great saphenous vein. This would still be scheduled later. 03/14/2015 -- she has heard back from the vascular office and has surgery scheduled for sometime in July. Her rheumatologist has decreased her prednisone dosage but she still on it. She has also had cataract surgery in her right eye recently this week. [name], [name] ([id number]) 03/20/2015 - No new complaints today. Pain improved. No fever or chills. Tolerating 2 layer compression. 04/14/2015 -- she was doing very well today she went off on vacation and now her edema has increased markedly the ulceration is bigger and her diabetes is not under control. 04/21/2015 -- I spoke to her PCP Dr. Candiss [name] and discussed the management which would include being seen by a general surgeon for debridement and taking multiple punch biopsies which would help in establishing the diagnosis of this is a vasculitis. She is agreeable about this and will set her up for the procedure with Dr. Tamala [name] at [facility]. She was seen by the surgeon Dr. Jamal [name] 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 She is scheduled to have laser ablation done next week.Pt was sent here for possible biopsy to look for vasculitis.  Feel it would be better to wait after laser ablation is completed- the ulcer may heal fully and biopsy may not be necessary 04/29/2015 -- she had the venous ablation done by [name] last Friday and we do not have any notes yet. She is doing fine otherwise. 05/06/2015 --Review of her recent vascular intervention shows that she was seen by [name] on 04/29/2015. The follow-up duplex which was done showed that both the great saphenous vein and the small saphenous vein remained patent with reflux consistent with an unsuccessful ablation. He has rescheduled her for another the endovenous ablation to be done in about 4 weekso time. 05/13/2015 -- he was seen by her surgeon Dr. Jamal [name] who asked her to continue with conservative therapy and he would speak to [name] about her management. [name] is going to schedule her surgery in the middle of August for a repeat endovenous ablation. Her pus culture from last week has grown : [place] her noted her sensitivity report but due to her multiple allergies I had tried clindamycin and she developed a rash with this too. She has been prescribed and anti-buttocks in the ER and is has it at home and she will let is know what she is going to be taking. 05/20/2015 -- she has developed a small spot on her right lower extremity but besides that it is not a full fledged ulceration. She did not get to see [name] last week and hopefully she will see him in the near future. 05/27/2015 -she is still awaiting her  appointment with [facility] and her vascular procedure is not scheduled until August 19. She will be seeing her PCP tomorrow and I have asked her to convey our discussion so that she is aware that debridement has not been done yet. 06/03/2015 -- was seen by her rheumatologist Dr. Dorthula Matas, who has been treating her for temporal arteritis and in his note  has mentioned the possibility of vasculitis or pyoderma gangrenosum. He is lowering her prednisone to 12-1/2 mg for 1 month and then 10 mg per the next month. I will again make an attempt to speak to her PCP Dr. Candiss [name] and her surgeon [name] to see if he can organize for a debridement in the OR with multiple biopsies to establish a diagnosis of vasculitis or pyoderma gangrenosum. 06/17/2015 -- KristinDew did her vascular procedure last week and a follow-up venous ultrasound shows good resolution of the veins as per the patient's history. He is to see her back in 2 weeks. 07/07/2015. -- the patient has had a heavy growth of Proteus mirabilis and Enterococcus faecalis. These Vanasten, Hunter [name] ([id number]) are sensitive to several drugs but the problem is she has allergies to all of these and hence I would like her to see Dr. Ola Spurr for this. She is also due to see [name] tomorrow and I will discussed the management with him including debridement under anesthesia and possible biopsies. 07/14/2015 -- she has an appointment to see Dr. Ola Spurr tomorrow and did see Dr. Bunnie Domino PA who will discuss my request with him. 07/21/2015 -- saw Dr. Ola Spurr was able to do a test on her and has put her on amoxicillin. She has been tolerating that and has had no problems with allergies to this. 07/28/2015 -- Last Friday I spoke to Dr. Leotis Pain regarding her care and he said that her right leg did not need any surgery and on the left leg was doing pretty good. We did agree that if she undergoes any procedure in the future he would do a couple of punch biopsies of the wound. 08/18/2015 -- her right leg is very tender and there is significant amount of slough. The left leg is looking much better 09/02/2015 -- she is going to have a debridement and punch biopsies of her right lower extremity by Dr. Leotis Pain this coming Thursday. Also seen Dr. Ola Spurr who has continued her on ciprofloxacin. 09/09/2015 -- on  09/04/2015 Dr. Leotis Pain took her to surgery - Irrigation and excisional debridement of skin, soft tissue, and muscle to about 40 cm2 to the right posterior calf with biopsy. Pathology results are -- DIAGNOSIS: SKIN, RIGHT LOWER EXTREMITY; BIOPSY: SKIN AND SOFT TISSUE WITH ULCERATION, SEE NOTE. - NEGATIVE FOR DYSPLASIA AND MALIGNANCY. Note: There is acute inflammation and ulceration of the epidermis. The dermis shows nonspecific inflammation, neovascularization, and hemosiderin deposition. The differential diagnosis for these findings includes stasis dermatitis, nonspecific dermatitis, and infection. Correlation with clinical findings is required. A PAS fungal stain is obtained and results will be reported in an addendum. cultures were also sent and this grew Pseudomonas aeruginosa, Escherichia coli, Proteus mirabilis, Klebsiella pneumoniae and they were all sensitive to ciprofloxacin which she is on. 09/16/2015 -- he saw Dr. 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 He is going to call the patient in and discuss with her the management possibly with Imuran. I will continue treating  her locally. 09/23/2015 -- she saw Dr. Ola Spurr today who was going to continue the antibiotics for now and stop after this course. She has an appointment to see Dr. Jefm Bryant in about a week's time. Her wound VAC has arrived but she did not bring it with her today. We will try and set her up for changes to the right leg 3 times a week. She is here for her first application of Apligraf to the left lower extremity. 09/30/2015 -- she has seen Dr. 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 because she is ambulatory at home health will not apply a wound VAC and she will have to come here 3 times a week on  Monday Wednesday and Friday. 10/10/2015 -- she is here for a second application of Apligraf to her left lower extremity. 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 Jefm Bryant. Other than that she is doing well 10/31/2015 -- she is here for a third application of Apligraf to her left lower extremity. after reviewing her wound on the right side I noted that it is granulating extremely well and we will use the remnants of the Apligraf on the right leg. 11/14/2015 -- she was recently admitted to the hospital between January 11 and 11/10/2015 for [name], [name] ([id number]) uncontrolled hypertension, diabetes mellitus, acute kidney injury. during her admission she was supported by wound care and also by antibiotics. Around this time her immunosuppression was stopped by Dr. Nunzio Cory. She is feeling much better now. 11/21/2015 -- she has had her fourth application of Apligraf today and it has been shared between the left and the right leg 11/24/2015 -- was here for a wound VAC change but it was noticed that she had profuse bleeding from the lobe medial part of her right lower extremity and I was called in to evaluate her. 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 She has been doing fine since then Electronic Signature(s) Signed: 11/28/2015 10:31:25 AM By: Kristin Fudge MD, [name] Entered By: [name] on 11/28/2015 10:31:25 [name], [name] ([id number]) -------------------------------------------------------------------------------- Physical Exam Details Patient Name: ANY, IZBICKI [name] Date of Service: 11/28/2015 9:30 AM Medical Record Number: [id number] Patient Account Number: [id number] Date of Birth/Sex: [birth year] (70 y.o. Female) Treating RN: [name] Primary Care Physician: [name] Other Clinician: Referring Physician: Glendon [name] Treating Physician/Extender: [name] in Treatment: 43 Constitutional . Pulse regular. Respirations normal and unlabored. Afebrile. . Eyes Nonicteric. Reactive to light. Ears, Nose, Mouth, and Throat Lips, teeth, and gums WNL.Marland Kitchen Moist mucosa without lesions. Neck supple and nontender. No palpable supraclavicular or cervical adenopathy. Normal sized without goiter. Respiratory WNL. No retractions.. Cardiovascular Pedal Pulses WNL. No clubbing, cyanosis or edema. Lymphatic No adneopathy. No adenopathy. No adenopathy. Musculoskeletal Adexa without tenderness or enlargement.. Digits and nails w/o clubbing, cyanosis, infection, petechiae, ischemia, or inflammatory conditions.. Integumentary (Hair, Skin) No suspicious lesions. No crepitus or fluctuance. No peri-wound warmth or erythema. No masses.Marland Kitchen Psychiatric Judgement and insight Intact.. No evidence of depression, anxiety, or agitation.. Notes the wound in the left lower extremity is looking excellent and was was started with moist saline gauze. The posterior-lateral wound on the right leg is also very clean and this was also washed out with saline gauze. Wound on the medial part of the right  lower extremity where there was previous bleeding has a lot of slough and this is going to need debridement with Santyl ointment. Electronic Signature(s) Signed: 11/28/2015 10:32:17 AM By: Kristin Fudge MD, [name] Entered By: [name] on 11/28/2015 10:32:17 [name], [name] ([id number]) -------------------------------------------------------------------------------- Physician Orders Details Patient Name: JOSLYNNE, BALSER [name] Date of Service: 11/28/2015 9:30 AM Medical Record Number: [id number] Patient Account Number: [id number] Date of Birth/Sex: 11/07/1945 (70 y.o. Female) Treating RN: [name] Primary Care Physician: [name] Other Clinician: Referring Physician: Glendon [name] Treating Physician/Extender: [name] in Treatment: 20 Verbal / Phone Orders: Yes Clinician: Montey [name] Read Back and Verified: Yes Diagnosis Coding Wound Cleansing Wound #2 Left,Distal Lower Leg o Cleanse wound with mild soap and water o May shower with protection. Wound #5 Right,Posterior Lower Leg o Cleanse wound with mild soap and water o May shower with protection. Wound #6 Right,Medial Lower Leg o Cleanse wound with mild soap and water o May shower with protection. Anesthetic Wound #2 Left,Distal Lower Leg o Topical Lidocaine 4% cream applied to wound bed prior to debridement Wound #5 Right,Posterior Lower Leg o Topical Lidocaine 4% cream applied to wound bed prior to debridement Wound #6 Right,Medial Lower Leg o Topical Lidocaine 4% cream applied to wound bed prior to debridement Skin Barriers/Peri-Wound Care Wound #2 Left,Distal Lower Leg o Skin Prep Wound #5 Right,Posterior Lower Leg o Skin Prep Wound #6 Right,Medial Lower Leg o Skin Prep Primary Wound Dressing Wound #2 Left,Distal Lower Leg o Other: - hydrogel and sorbact [name], [name] ([id number]) Wound #5 Right,Posterior Lower Leg o Other: - hydrogel and sorbact Wound #6 Right,Medial Lower Leg o Santyl Ointment Secondary Dressing Wound #2 Left,Distal Lower Leg o Dry Gauze o Boardered Foam Dressing Wound #5 Right,Posterior Lower Leg o Dry Gauze o Boardered Foam Dressing Wound #6 Right,Medial Lower Leg o Dry Gauze o Boardered Foam Dressing Dressing Change Frequency Wound #2 Left,Distal Lower Leg o Change dressing every other day. Wound #5 Right,Posterior Lower Leg o Change dressing every other day. Wound #6 Right,Medial Lower Leg o Change dressing every other day. Follow-up Appointments Wound #2 Left,Distal Lower Leg o Return Appointment in 1 week. Wound #5 Right,Posterior Lower Leg o Return Appointment in 1 week. Wound #6 Right,Medial Lower Leg o Return  Appointment in 1 week. Edema Control Wound #2 Left,Distal Lower Leg o Patient to wear own compression stockings Wound #5 Right,Posterior Lower Leg o Patient to wear own compression stockings Wound #6 Right,Medial Lower Leg [name], [name] ([id number]) o Patient to wear own compression stockings Negative Pressure Wound Therapy Wound #5 Right,Posterior Lower Leg o Discontinue NPWT. Medications-please add to medication list. Wound #6 Right,Medial Lower Leg o Santyl Enzymatic Ointment Electronic Signature(s) Signed: 11/28/2015 4:33:45 PM By: Kristin Fudge MD, [name] Signed: 11/28/2015 5:19:21 PM By: [name] Entered By: [name] on 11/28/2015 10:22:28 [name], [name] ([id number]) -------------------------------------------------------------------------------- Problem List Details Patient Name: PAVIELLE, WOFFORD [name] Date of Service: 11/28/2015 9:30 AM Medical Record Number: [id number] Patient Account Number: [id number] Date of Birth/Sex: 1946-01-01 (71 y.o. 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 01/31/2015 Yes exposed E66.01 Morbid (severe) obesity due to excess calories 01/31/2015 Yes I89.0 Lymphedema, not elsewhere classified 01/31/2015 Yes L03.116 Cellulitis of left lower  limb 07/07/2015 Yes L97.212 Non-pressure chronic ulcer of right calf with fat layer 11/24/2015 Yes exposed I87.311 Chronic venous hypertension (idiopathic) with ulcer of 11/24/2015 Yes right lower extremity Inactive Problems Resolved Problems ICD-10 Code Description Active Date Resolved Date I83.222 Varicose veins of left lower extremity with both  ulcer of 03/07/2015 03/07/2015 calf and inflammation COLLETT, JAY ([id number]) I83.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 with both ulcer of 03/07/2015 03/07/2015 ankle and inflammation Electronic Signature(s) Signed: 11/28/2015 10:23:09 AM By: Kristin Fudge MD, [name] Entered By: [name] on 11/28/2015 10:23:09 [name], [name] ([id number]) -------------------------------------------------------------------------------- Progress Note Details Patient Name: [name], Zully [name] Date of Service: 11/28/2015 9:30 AM Medical Record Number: [id number] Patient Account Number: [id number] Date of Birth/Sex: 25-Dec-1945 (70 y.o. Female) Treating RN: [name] Primary Care Physician: [name] Other Clinician: Referring Physician: Glendon [name] Treating Physician/Extender: [name] in Treatment: 19 Subjective Chief Complaint Information obtained from Patient Patient presents to the wound care center for a consult due non healing wound 70 year old patient comes with a history of having a ulcer on the left lower extremity for the past 4 weeks. she says she's had swelling of both lower extremities for about a year after she started having prednisone. 02/07/2015 -- her vascular appointments obtained were in the first and third week of June. she is able to go to [place] and we will try and get her some earlier appointments. Other than that nothing else has changed in her management. History of Present Illness (HPI) 70 year old patient who is known to have diabetes mellitus type 2, chronic renal insufficiency, coronary artery disease, hypertension, hypercholesterolemia, temporal arteritis and inflammatory arthritiss also has a history of having a hysterectomy and some orthopedic related surgeries. The ulcer on the left lower extremity started off as a blister and then. Got progressively worse. She does not have any fever or chills and has not had any recent surgical intervention for  this. Her last hemoglobin A1c was 10.1 in September 2015. She has been recently put on doxycycline by her PCP. She is now also allergic to doxycycline and was this was changed over to Keflex. 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 She does see a cardiologist and also takes a diuretic. 02/07/2015 her arterial and venous duplex studies to be done have dates been given as the first and third week of June. This is at [facility]. We are going to try and get early appointments at [facility]. other than that nothing has changed in her management. 02/14/2015 -- we have been able to get her an appointment in [facility] on May 20 which is much earlier than her previous ones at [place]. She continues with her prednisone and her sugars are in the range of 150-200. 02/21/2015 We were able to get a vascular lab workup for her today and she is going to be there at 2:00 this afternoon. the swelling of her leg has gone down significantly but she still has some tenderness over the wounds. 02/28/2015 - She has had one of two vascular workups done, and this coming Tuesday has another, at [facility]. She continues to be on steroid medications. She has significant sensitivity in her left lower extremity and has pain suggestive of neuropathic pain and I have asked her to address this with her primary care physician. 03/07/2015 -- The patient saw [name] for a  consultation and he has had her arteries are okay but she has 2 incompetent veins on the left lower extremity and he is going to set her up for surgery. Official report is awaited. Addendum: Official reports are now available and on 03/04/2015. She was seen and lower extremity Tindel, Sabriel [name] ([id number]) venous duplex exam was done. There was reflux present within the left greater saphenous vein below the knee and also  the left small saphenous vein. Arterial duplex showed normal triphasic waveforms throughout the left lower extremity without any significant stenosis. Her ABIs were noncompressible bilaterally but a waveforms were normal and a digital pressures were normal bilaterally consistent with no significant arterial insufficiency. He has recommended endovenous ablation of both the left small saphenous and the left great saphenous vein. This would still be scheduled later. 03/14/2015 -- she has heard back from the vascular office and has surgery scheduled for sometime in July. Her rheumatologist has decreased her prednisone dosage but she still on it. She has also had cataract surgery in her right eye recently this week. 03/20/2015 - No new complaints today. Pain improved. No fever or chills. Tolerating 2 layer compression. 04/14/2015 -- she was doing very well today she went off on vacation and now her edema has increased markedly the ulceration is bigger and her diabetes is not under control. 04/21/2015 -- I spoke to her PCP Dr. Candiss [name] and discussed the management which would include being seen by a general surgeon for debridement and taking multiple punch biopsies which would help in establishing the diagnosis of this is a vasculitis. She is agreeable about this and will set her up for the procedure with Dr. Tamala [name] at [facility]. She was seen by the surgeon Dr. Jamal [name] 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 She is scheduled to have laser ablation done next week.Pt was sent here for possible biopsy to look for vasculitis. Feel it would be better to wait after laser ablation is completed- the ulcer may heal fully and biopsy may not be necessary 04/29/2015 -- she had the venous ablation done by [name] last Friday and we do not have any notes yet. She is doing fine  otherwise. 05/06/2015 --Review of her recent vascular intervention shows that she was seen by [name] on 04/29/2015. The follow-up duplex which was done showed that both the great saphenous vein and the small saphenous vein remained patent with reflux consistent with an unsuccessful ablation. He has rescheduled her for another the endovenous ablation to be done in about 4 weeks time. 05/13/2015 -- he was seen by her surgeon Dr. Jamal [name] who asked her to continue with conservative therapy and he would speak to [name] about her management. [name] is going to schedule her surgery in the middle of August for a repeat endovenous ablation. Her pus culture from last week has grown : [place] her noted her sensitivity report but due to her multiple allergies I had tried clindamycin and she developed a rash with this too. She has been prescribed and anti-buttocks in the ER and is has it at home and she will let is know what she is going to be taking. 05/20/2015 -- she has developed a small spot on her right lower extremity but besides that it is not a full fledged ulceration. She did not get to see [name] last  week and hopefully she will see him in the near future. 05/27/2015 -she is still awaiting her appointment with KristinDew and her vascular procedure is not scheduled until August 19. She will be seeing her PCP tomorrow and I have asked her to convey our discussion so that she is aware that debridement has not been done yet. 06/03/2015 -- was seen by her rheumatologist Dr. 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 He is lowering her prednisone to 12-1/2 mg for 1 month and then 10 mg per the next month. I will again make an attempt to speak to her PCP Dr. Candiss [name]  and her surgeon [name] to see if he can organize for a debridement in the OR with multiple biopsies to establish a diagnosis of vasculitis or pyoderma gangrenosum. 06/17/2015 -- KristinDew did her vascular procedure last week and a follow-up venous ultrasound shows good resolution of the veins as per the patient's history. He is to see her back in 2 weeks. 07/07/2015. -- the patient has had a heavy growth of Proteus mirabilis and Enterococcus faecalis. These are sensitive to several drugs but the problem is she has allergies to all of these and hence I would like her to see Dr. Ola Spurr for this. She is also due to see [name] tomorrow and I will discussed the management with him including debridement under anesthesia and possible biopsies. 07/14/2015 -- she has an appointment to see Dr. Ola Spurr tomorrow and did see Dr. Bunnie Domino PA who will discuss my request with him. 07/21/2015 -- saw Dr. Ola Spurr was able to do a test on her and has put her on amoxicillin. She has been tolerating that and has had no problems with allergies to this. 07/28/2015 -- Last Friday I spoke to Dr. Leotis Pain regarding her care and he said that her right leg did not need any surgery and on the left leg was doing pretty good. We did agree that if she undergoes any procedure in the future he would do a couple of punch biopsies of the wound. 08/18/2015 -- her right leg is very tender and there is significant amount of slough. The left leg is looking much better 09/02/2015 -- she is going to have a debridement and punch biopsies of her right lower extremity by Dr. Leotis Pain this coming Thursday. Also seen Dr. Ola Spurr who has continued her on ciprofloxacin. 09/09/2015 -- on 09/04/2015 Dr. Leotis Pain took her to surgery - Irrigation and excisional debridement of skin, soft tissue, and muscle to about 40 cm2 to the right posterior calf with biopsy. Pathology results are -- DIAGNOSIS: SKIN, RIGHT LOWER EXTREMITY; BIOPSY:  SKIN AND SOFT TISSUE WITH ULCERATION, SEE NOTE. - NEGATIVE FOR DYSPLASIA AND MALIGNANCY. Note: There is acute inflammation and ulceration of the epidermis. The dermis shows nonspecific inflammation, neovascularization, and hemosiderin deposition. The differential diagnosis for these findings includes stasis dermatitis, nonspecific dermatitis, and infection. Correlation with clinical findings is required. A PAS fungal stain is obtained and results will be reported in an addendum. cultures were also sent and this grew Pseudomonas aeruginosa, Escherichia coli, Proteus mirabilis, Klebsiella pneumoniae and they were all sensitive to ciprofloxacin which she is on. 09/16/2015 -- he saw Dr. 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 He is going to  call the patient in and discuss with her the management possibly with Imuran. I will continue treating her locally. 09/23/2015 -- she saw Dr. Ola Spurr today who was going to continue the antibiotics for now and stop after this course. She has an appointment to see Dr. Jefm Bryant in about a week's time. Her wound VAC has arrived but she did not bring it with her today. We will try and set her up for changes to the right leg 3 times a week. She is here for her first application of Apligraf to the left lower extremity. 09/30/2015 -- she has seen Dr. 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 because she is ambulatory at home health HIMMELMAN, ODIE SANTODOMINGO. ([id number]) will not apply a wound VAC and she will have to come here 3 times a week on Monday Wednesday and Friday. 10/10/2015 -- she is here for a second application of Apligraf to her left lower extremity. 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 Jefm Bryant. Other than that she is doing well 10/31/2015 -- she is here for a third application of Apligraf to her left lower extremity. after reviewing her wound on the right side I noted that it is granulating extremely well and we will use the remnants of the Apligraf on the right leg. 11/14/2015 -- she was recently admitted to the hospital between January 11 and 11/10/2015 for uncontrolled hypertension, diabetes mellitus, acute kidney injury. during her admission she was supported by wound care and also by antibiotics. Around this time her immunosuppression was stopped by Dr. Nunzio Cory. She is feeling much better now. 11/21/2015 -- she has had her fourth application of Apligraf today and it has been shared between the left and the right leg 11/24/2015 -- was here for a wound VAC change but it was noticed that she had profuse bleeding from the lobe medial part of her right lower extremity and I was called in to evaluate her. 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 She has been doing fine since then Objective Constitutional Pulse regular. Respirations normal and unlabored. Afebrile. Vitals Time Taken: 9:48 AM, Height: 65 in, Weight: 248 lbs, BMI: 41.3, Temperature: 98.1 F, Pulse: 71 bpm, Respiratory Rate: 18 breaths/min, Blood Pressure: 140/57 mmHg. Eyes Nonicteric. Reactive to light. Ears, Nose, Mouth, and Throat Lips, teeth, and gums WNL.Marland Kitchen Moist mucosa without lesions. Neck supple and nontender. No palpable supraclavicular or cervical adenopathy. Normal sized without goiter. Respiratory WNL. No retractions.. Cardiovascular Pedal Pulses WNL. No clubbing, cyanosis or edema. Lymphatic Gutman, Marybelle [name] ([id number]) No adneopathy. No adenopathy. No adenopathy. Musculoskeletal Adexa without tenderness or enlargement.. Digits and nails w/o clubbing, cyanosis, infection, petechiae, ischemia,  or inflammatory conditions.Marland Kitchen Psychiatric Judgement and insight Intact.. No evidence of depression, anxiety, or agitation.. General Notes: the wound in the left lower extremity is looking excellent and was was started with moist saline gauze. The posterior-lateral wound on the right leg is also very clean and this was also washed out with saline gauze. Wound on the medial part of the right lower extremity where there was previous bleeding has a lot of slough and this is going to need debridement with Santyl ointment. Integumentary (Hair, Skin) No suspicious lesions. No crepitus or fluctuance. No peri-wound warmth or erythema. No masses.. Wound #2 status is Open.  Original cause of wound was Blister. The wound is located on the Left,Distal Lower Leg. The wound measures 1.5cm length x 0.9cm width x 0.1cm depth; 1.06cm^2 area and 0.106cm^3 volume. The wound is limited to skin breakdown. There is no tunneling or undermining noted. There is a large amount of serous drainage noted. The wound margin is thickened. There is large (67- 100%) red, pink granulation within the wound bed. There is no necrotic tissue within the wound bed. The periwound skin appearance exhibited: Localized Edema, Scarring, Maceration, Moist. Periwound temperature was noted as No Abnormality. The periwound has tenderness on palpation. Wound #5 status is Open. Original cause of wound was Gradually Appeared. The wound is located on the Right,Posterior Lower Leg. The wound measures 5.6cm length x 8.3cm width x 0.2cm depth; 36.505cm^2 area and 7.301cm^3 volume. There is no tunneling or undermining noted. There is a large amount of serosanguineous drainage noted. The wound margin is thickened. There is large (67-100%) red, pink granulation within the wound bed. There is a small (1-33%) amount of necrotic tissue within the wound bed including Eschar and Adherent Slough. The periwound skin appearance exhibited: Localized  Edema, Scarring, Moist. Periwound temperature was noted as No Abnormality. The periwound has tenderness on palpation. Wound #6 status is Open. Original cause of wound was Gradually Appeared. The wound is located on the Right,Medial Lower Leg. The wound measures 6cm length x 4.1cm width x 0.2cm depth; 19.321cm^2 area and 3.864cm^3 volume. The wound is limited to skin breakdown. There is no tunneling or undermining noted. There is a medium amount of purulent drainage noted. The wound margin is distinct with the outline attached to the wound base. There is no granulation within the wound bed. There is a large (67-100%) amount of necrotic tissue within the wound bed including Eschar and Adherent Slough. The periwound skin appearance exhibited: Moist. The periwound skin appearance did not exhibit: Callus, Crepitus, Excoriation, Fluctuance, Friable, Induration, Localized Edema, Rash, Scarring, Dry/Scaly, Maceration, Atrophie Blanche, Cyanosis, Ecchymosis, Hemosiderin Staining, Mottled, Pallor, Rubor, Erythema. Periwound temperature was noted as No Abnormality. The periwound has tenderness on palpation. 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 On the right leg where she has a lot of slough we will use Santyl ointment. The wound VAC will be discontinued. To see me for the fifth application  of Apligraf to her left lower extremity next week. Plan Wound Cleansing: Wound #2 Left,Distal Lower Leg: Cleanse wound with mild soap and water May shower with protection. Wound #5 Right,Posterior Lower Leg: Cleanse wound with mild soap and water May shower with protection. Wound #6 Right,Medial Lower Leg: Cleanse wound with mild soap and water May shower with protection. Anesthetic: Wound #2 Left,Distal Lower Leg: Topical Lidocaine 4% cream applied to wound bed prior to debridement Wound #5 Right,Posterior Lower Leg: Topical Lidocaine 4% cream applied to wound bed prior to debridement Wound #6 Right,Medial Lower Leg: Topical Lidocaine 4% cream applied to wound bed  prior to debridement Skin Barriers/Peri-Wound Care: Wound #2 Left,Distal Lower Leg: Skin Prep LEYLAH, KNIERIM. ([id number]) Wound #5 Right,Posterior Lower Leg: Skin Prep Wound #6 Right,Medial Lower Leg: Skin Prep Primary Wound Dressing: Wound #2 Left,Distal Lower Leg: Other: - hydrogel and sorbact Wound #5 Right,Posterior Lower Leg: Other: - hydrogel and sorbact Wound #6 Right,Medial Lower Leg: Santyl Ointment Secondary Dressing: Wound #2 Left,Distal Lower Leg: Dry Gauze Boardered Foam Dressing Wound #5 Right,Posterior Lower Leg: Dry Gauze Boardered Foam Dressing Wound #6 Right,Medial Lower Leg: Dry Gauze Boardered Foam Dressing Dressing Change Frequency: Wound #2 Left,Distal Lower Leg: Change dressing every other day. Wound #5 Right,Posterior Lower Leg: Change dressing every other day. Wound #6 Right,Medial Lower Leg: Change dressing every other day. Follow-up Appointments: Wound #2 Left,Distal Lower Leg: Return Appointment in 1 week. Wound #5 Right,Posterior Lower Leg: Return Appointment in 1 week. Wound #6 Right,Medial Lower Leg: Return Appointment in 1 week. Edema Control: Wound #2 Left,Distal Lower Leg: Patient to wear own compression stockings Wound #5 Right,Posterior Lower Leg: Patient to  wear own compression stockings Wound #6 Right,Medial Lower Leg: Patient to wear own compression stockings Negative Pressure Wound Therapy: Wound #5 Right,Posterior Lower Leg: Discontinue NPWT. Medications-please add to medication list.: Wound #6 Right,Medial Lower Leg: Santyl Enzymatic Ointment Brearley, Hend [name] ([id number]) At the present on the left and right lower extremity where she has clean granulating wounds we will use salt back with hydrogel and use her compression stockings. On the right leg where she has a lot of slough we will use Santyl ointment. The wound VAC will be discontinued. To see me for the fifth application of Apligraf to her left lower extremity next week. Electronic Signature(s) Signed: 11/28/2015 10:33:24 AM By: Kristin Fudge MD, [name] Entered By: [name] on 11/28/2015 10:33:24 Leonetti, Veryl [name] ([id number]) -------------------------------------------------------------------------------- SuperBill Details Patient Name: BEEDIE, KRANZ [name] Date of Service: 11/28/2015 Medical Record Number: [id number] Patient Account Number: [id number] Date of Birth/Sex: 1945/11/01 (70 y.o. Female) Treating RN: [name] Primary Care Physician: [name] Other Clinician: Referring Physician: Glendon [name] Treating Physician/Extender: [name] in Treatment: 43 Diagnosis Coding ICD-10 Codes Code Description E11.622 Type 2 diabetes mellitus with other skin ulcer L97.322 Non-pressure chronic ulcer of left ankle with fat layer exposed E66.01 Morbid (severe) obesity due to excess calories I89.0 Lymphedema, not elsewhere classified L03.116 Cellulitis of left lower limb L97.212 Non-pressure chronic ulcer of right calf with fat layer exposed I87.311 Chronic venous hypertension (idiopathic) with ulcer of right lower extremity Physician Procedures CPT4 Code Description: [id number] - WC PHYS LEVEL 3 - EST PT ICD-10 Description Diagnosis E11.622 Type 2 diabetes  mellitus with other skin ulcer L97.322 Non-pressure chronic ulcer of left ankle with fat l I89.0 Lymphedema, not elsewhere classified  I87.311 Chronic venous hypertension (idiopathic) with ulcer Modifier: ayer exposed of right lower Quantity: 1 extremity Electronic Signature(s) Signed: 11/28/2015 10:34:48 AM By: Kristin Fudge MD, [name] Entered By: [name] on 11/28/2015 10:34:48

## 2015-11-30 NOTE — Progress Notes (Signed)
MARTY, PICKET ([id number]) Visit Report for 11/28/2015 Arrival Information Details Patient Name: [name], [name] Date of Service: 11/28/2015 9:30 AM Medical Record Number: [id number] Patient Account Number: [id number] Date of Birth/Sex: 1945/12/10 (70 y.o. Female) Treating RN: [name] Primary Care Physician: Glendon Axe Other Clinician: Referring Physician: Glendon Axe Treating Physician/Extender: Frann Rider in Treatment: 59 Visit Information History Since Last Visit Added or deleted any medications: No Patient Arrived: Cane Any new allergies or adverse reactions: No Arrival Time: 09:47 Had a fall or experienced change in No Accompanied By: self activities of daily living that may affect Transfer Assistance: None risk of falls: Patient Identification Verified: Yes Signs or symptoms of abuse/neglect since last No Secondary Verification Process Yes visito Completed: Hospitalized since last visit: No Patient Requires Transmission- No Pain Present Now: No Based Precautions: Patient Has Alerts: Yes Patient Alerts: Patient on Blood Thinner Electronic Signature(s) Signed: 11/28/2015 5:19:21 PM By: [name] Entered By: [name] on 11/28/2015 09:47:42 Pendergrass, Vandora D. ([id number]) -------------------------------------------------------------------------------- Encounter Discharge Information Details Patient Name: [name], [name] Date of Service: 11/28/2015 9:30 AM Medical Record Number: [id number] Patient Account Number: [id number] Date of Birth/Sex: 09-Feb-1946 (70 y.o. Female) Treating RN: [name] Primary Care Physician: Glendon Axe Other Clinician: Referring Physician: Glendon Axe Treating Physician/Extender: Frann Rider in Treatment: 2 Encounter Discharge Information Items Discharge Pain Level: 0 Discharge Condition: Stable Ambulatory Status: Cane Discharge Destination: Home Transportation: Private Auto Accompanied By:  self Schedule Follow-up Appointment: Yes Medication Reconciliation completed No and provided to Patient/Care Zeffie Bickert: Provided on Clinical Summary of Care: 11/28/2015 Form Type Recipient Paper Patient LM Electronic Signature(s) Signed: 11/28/2015 11:55:04 AM By: [name] Previous Signature: 11/28/2015 10:35:21 AM Version By: Ruthine Dose Entered By: [name] on 11/28/2015 11:55:04 [place]ing[place], Loretha D. ([id number]) -------------------------------------------------------------------------------- Lower Extremity Assessment Details Patient Name: [name], [name] Date of Service: 11/28/2015 9:30 AM Medical Record Number: [id number] Patient Account Number: [id number] Date of Birth/Sex: 1945/11/15 (70 y.o. Female) Treating RN: [name] Primary Care Physician: Glendon Axe Other Clinician: Referring Physician: Glendon Axe Treating Physician/Extender: Frann Rider in Treatment: 43 Vascular Assessment Pulses: Posterior Tibial Extremity colors, hair growth, and conditions: Temperature of Extremity: [Left:Warm] [Right:Warm] Capillary Refill: [Left:< 3 seconds] Toe Nail Assessment Left: Right: Thick: No No Discolored: No No Deformed: No No Improper Length and Hygiene: No No Electronic Signature(s) Signed: 11/28/2015 5:19:21 PM By: [name] Entered By: [name] on 11/28/2015 10:02:07 Charles, Eleni D. ([id number]) -------------------------------------------------------------------------------- Multi Wound Chart Details Patient Name: Kristin Stare D. Date of Service: 11/28/2015 9:30 AM Medical Record Number: [id number] Patient Account Number: [id number] Date of Birth/Sex: 1946-10-19 (70 y.o. Female) Treating RN: [name] Primary Care Physician: Glendon Axe Other Clinician: Referring Physician: Glendon Axe Treating Physician/Extender: Frann Rider in Treatment: 43 Vital Signs Height(in): 65 Pulse(bpm): 71 Weight(lbs): 248 Blood  Pressure 140/57 (mmHg): Body Mass Index(BMI): 41 Temperature(F): 98.1 Respiratory Rate 18 (breaths/min): Photos: [2:No Photos] [5:No Photos] [6:No Photos] Wound Location: [2:Left Lower Leg - Distal] [5:Right Lower Leg - Posterior] [6:Right Lower Leg - Medial] Wounding Event: [2:Blister] [5:Gradually Appeared] [6:Gradually Appeared] Primary Etiology: [2:Venous Leg Ulcer] [5:Venous Leg Ulcer] [6:Diabetic Wound/Ulcer of the Lower Extremity] Comorbid History: [2:Cataracts, Asthma, Coronary Artery Disease, Coronary Artery Disease, Coronary Artery Disease, Hypertension, Type II Diabetes, Osteoarthritis, Diabetes, Osteoarthritis, Diabetes, Osteoarthritis, Neuropathy] [5:Cataracts, Asthma,  Hypertension, Type II Neuropathy] [6:Cataracts, Asthma, Hypertension, Type II Neuropathy] Date Acquired: [2:12/30/2014] [5:04/29/2015] [6:10/10/2015] Weeks of Treatment: [2:43] [5:27] [6:7] Wound Status: [2:Open] [5:Open] [6:Open] Clustered Wound: [2:Yes] [5:No] [6:No] Measurements  L x W x D 1.5x0.9x0.1 [5:5.6x8.3x0.2] [6:6x4.1x0.2] (cm) Area (cm) : [2:1.06] [5:36.505] [6:19.321] Volume (cm) : [2:0.106] [5:7.301] [6:3.864] % Reduction in Area: [2:-49.90%] [5:-19317.60%] [6:-459.10%] % Reduction in Volume: -49.30% [5:-38326.30%] [6:-1016.80%] Classification: [2:Full Thickness Without Exposed Support Structures] [5:Full Thickness Without Exposed Support Structures] [6:Grade 1] HBO Classification: [2:Grade 2] [5:Grade 2] [6:N/A] Exudate Amount: [2:Large] [5:Large] [6:Medium] Exudate Type: [2:Serous] [5:Serosanguineous] [6:Purulent] Exudate Color: [2:amber] [5:red, brown] [6:yellow, brown, green] Wound Margin: [2:Thickened] [5:Thickened] [6:Distinct, outline attached] Granulation Amount: [2:Large (67-100%)] [5:Large (67-100%)] [6:None Present (0%)] Granulation Quality: Red, Pink Red, Pink N/A Necrotic Amount: None Present (0%) Small (1-33%) Large (67-100%) Necrotic Tissue: N/A Eschar, Adherent Slough  Eschar, Adherent Slough Exposed Structures: Fascia: No N/A Fascia: No Fat: No Fat: No Tendon: No Tendon: No Muscle: No Muscle: No Joint: No Joint: No Bone: No Bone: No Limited to Skin Limited to Skin Breakdown Breakdown Epithelialization: None None None Periwound Skin Texture: Edema: Yes Edema: Yes Edema: No Scarring: Yes Scarring: Yes Excoriation: No Induration: No Callus: No Crepitus: No Fluctuance: No Friable: No Rash: No Scarring: No Periwound Skin Maceration: Yes Moist: Yes Moist: Yes Moisture: Moist: Yes Maceration: No Dry/Scaly: No Periwound Skin Color: No Abnormalities Noted No Abnormalities Noted Atrophie Blanche: No Cyanosis: No Ecchymosis: No Erythema: No Hemosiderin Staining: No Mottled: No Pallor: No Rubor: No Temperature: No Abnormality No Abnormality No Abnormality Tenderness on Yes Yes Yes Palpation: Wound Preparation: Ulcer Cleansing: Ulcer Cleansing: Ulcer Cleansing: Rinsed/Irrigated with Rinsed/Irrigated with Rinsed/Irrigated with Saline Saline Saline Topical Anesthetic Topical Anesthetic Topical Anesthetic Applied: Other: lidocaine Applied: Other: lidocaine Applied: None 4% 4% Treatment Notes Electronic Signature(s) Signed: 11/28/2015 5:19:21 PM By: [name] Entered By: [name] on 11/28/2015 10:02:29 Mersch, DUBLIN HIGHSMITH ([id number]) [place], [place]. ([id number]) -------------------------------------------------------------------------------- [place] Details Patient Name: JUSTENE, HINCHCLIFFE D. Date of Service: 11/28/2015 9:30 AM Medical Record Number: [id number] Patient Account Number: [id number] Date of Birth/Sex: 06-30-1946 (70 y.o. Female) Treating RN: [name] Primary Care Physician: Glendon Axe Other Clinician: Referring Physician: Glendon Axe Treating Physician/Extender: Frann Rider in Treatment: 67 Active Inactive Orientation to the Wound Care Program Nursing Diagnoses: Knowledge  deficit related to the wound healing center program Goals: Patient/caregiver will verbalize understanding of the [place] Program Date Initiated: 01/31/2015 Goal Status: Active Interventions: Provide education on orientation to the wound center Notes: Venous Leg Ulcer Nursing Diagnoses: Potential for venous Insuffiency (use before diagnosis confirmed) Goals: Non-invasive venous studies are completed as ordered Date Initiated: 01/31/2015 Goal Status: Active Patient/caregiver will verbalize understanding of disease process and disease management Date Initiated: 01/31/2015 Goal Status: Active Interventions: Assess peripheral edema status every visit. Notes: Wound/Skin Impairment Nursing Diagnoses: Impaired tissue integrity Knowledge deficit related to smoking impact on wound healing Deemer, Andres D. ([id number]) Goals: Patient/caregiver will verbalize understanding of skin care regimen Date Initiated: 01/31/2015 Goal Status: Active Ulcer/skin breakdown will heal within 14 weeks Date Initiated: 01/31/2015 Goal Status: Active Interventions: Assess ulceration(s) every visit Notes: Electronic Signature(s) Signed: 11/28/2015 5:19:21 PM By: [name] Entered By: [name] on 11/28/2015 10:02:21 [place], Marylou D. ([id number]) -------------------------------------------------------------------------------- Patient/Caregiver Education Details Patient Name: KOU, MOLINARES D. Date of Service: 11/28/2015 9:30 AM Medical Record Number: [id number] Patient Account Number: [id number] Date of Birth/Gender: July 28, 1946 (70 y.o. Female) Treating RN: [name] Primary Care Physician: Glendon Axe Other Clinician: Referring Physician: Glendon Axe Treating Physician/Extender: Frann Rider in Treatment: 37 Education Assessment Education Provided To: Patient Education Topics Provided Wound/Skin Impairment: Handouts: Other: wound care as ordered and supplies  ordered Methods:  Demonstration, Explain/Verbal Responses: State content correctly Electronic Signature(s) Signed: 11/28/2015 11:55:28 AM By: [name] Entered By: [name] on 11/28/2015 11:55:28 Fouty, Latania D. ([id number]) -------------------------------------------------------------------------------- Wound Assessment Details Patient Name: Lumbert, Shanicka D. Date of Service: 11/28/2015 9:30 AM Medical Record Number: [id number] Patient Account Number: [id number] Date of Birth/Sex: Oct 09, 1946 (70 y.o. Female) Treating RN: [name] Primary Care Physician: Glendon Axe Other Clinician: Referring Physician: Glendon Axe Treating Physician/Extender: Frann Rider in Treatment: 43 Wound Status Wound Number: 2 Primary Venous Leg Ulcer Etiology: Wound Location: Left Lower Leg - Distal Wound Open Wounding Event: Blister Status: Date Acquired: 12/30/2014 Comorbid Cataracts, Asthma, Coronary Artery Weeks Of Treatment: 43 History: Disease, Hypertension, Type II Clustered Wound: Yes Diabetes, Osteoarthritis, Neuropathy Photos Photo Uploaded By: [name] on 11/28/2015 11:42:23 Wound Measurements Length: (cm) 1.5 Width: (cm) 0.9 Depth: (cm) 0.1 Area: (cm) 1.06 Volume: (cm) 0.106 % Reduction in Area: -49.9% % Reduction in Volume: -49.3% Epithelialization: None Tunneling: No Undermining: No Wound Description Full Thickness Without Classification: Exposed Support Structures Diabetic Severity Grade 2 (Wagner): Wound Margin: Thickened Exudate Amount: Large Exudate Type: Serous Exudate Color: amber Foul Odor After Cleansing: No Wound Bed Granulation Amount: Large (67-100%) Exposed Structure Libbey, Adele D. ([id number]) Granulation Quality: Red, Pink Fascia Exposed: No Necrotic Amount: None Present (0%) Fat Layer Exposed: No Tendon Exposed: No Muscle Exposed: No Joint Exposed: No Bone Exposed: No Limited to Skin Breakdown Periwound Skin  Texture Texture Color No Abnormalities Noted: No No Abnormalities Noted: No Localized Edema: Yes Temperature / Pain Scarring: Yes Temperature: No Abnormality Moisture Tenderness on Palpation: Yes No Abnormalities Noted: No Maceration: Yes Moist: Yes Wound Preparation Ulcer Cleansing: Rinsed/Irrigated with Saline Topical Anesthetic Applied: Other: lidocaine 4%, Treatment Notes Wound #2 (Left, Distal Lower Leg) 1. Cleansed with: Clean wound with Normal Saline 2. Anesthetic Topical Lidocaine 4% cream to wound bed prior to debridement 4. Dressing Applied: Hydrogel Other dressing (specify in notes) 5. Secondary Dressing Applied Gauze and Kerlix/Conform 7. Secured with Tape Notes hydrogel and sorbact Electronic Signature(s) Signed: 11/28/2015 5:19:21 PM By: [name] Entered By: [name] on 11/28/2015 10:00:14 Fessenden, Dearra D. ([id number]) -------------------------------------------------------------------------------- Wound Assessment Details Patient Name: Dieterich, Ileah D. Date of Service: 11/28/2015 9:30 AM Medical Record Number: [id number] Patient Account Number: [id number] Date of Birth/Sex: 1945-11-22 (70 y.o. Female) Treating RN: [name] Primary Care Physician: Glendon Axe Other Clinician: Referring Physician: Glendon Axe Treating Physician/Extender: Frann Rider in Treatment: 43 Wound Status Wound Number: 5 Primary Venous Leg Ulcer Etiology: Wound Location: Right Lower Leg - Posterior Wound Open Wounding Event: Gradually Appeared Status: Date Acquired: 04/29/2015 Comorbid Cataracts, Asthma, Coronary Artery Weeks Of Treatment: 27 History: Disease, Hypertension, Type II Clustered Wound: No Diabetes, Osteoarthritis, Neuropathy Photos Photo Uploaded By: [name] on 11/28/2015 11:42:24 Wound Measurements Length: (cm) 5.6 Width: (cm) 8.3 Depth: (cm) 0.2 Area: (cm) 36.505 Volume: (cm) 7.301 % Reduction in Area: -19317.6% %  Reduction in Volume: -38326.3% Epithelialization: None Tunneling: No Undermining: No Wound Description Full Thickness Without Exposed Foul Odor A Classification: Support Structures Diabetic Severity Grade 2 (Wagner): Wound Margin: Thickened Exudate Amount: Large Exudate Type: Serosanguineous Exudate Color: red, brown fter Cleansing: No Wound Bed Granulation Amount: Large (67-100%) Simmerman, Landrie D. ([id number]) Granulation Quality: Red, Pink Necrotic Amount: Small (1-33%) Necrotic Quality: Eschar, Adherent Slough Periwound Skin Texture Texture Color No Abnormalities Noted: No No Abnormalities Noted: No Localized Edema: Yes Temperature / Pain Scarring: Yes Temperature: No Abnormality Moisture Tenderness on Palpation: Yes No Abnormalities Noted: No Moist: Yes Wound  Preparation Ulcer Cleansing: Rinsed/Irrigated with Saline Topical Anesthetic Applied: Other: lidocaine 4%, Treatment Notes Wound #5 (Right, Posterior Lower Leg) 1. Cleansed with: Clean wound with Normal Saline 2. Anesthetic Topical Lidocaine 4% cream to wound bed prior to debridement 4. Dressing Applied: Hydrogel Other dressing (specify in notes) 5. Secondary Dressing Applied Gauze and Kerlix/Conform 7. Secured with Tape Notes hydrogel and sorbact Electronic Signature(s) Signed: 11/28/2015 5:19:21 PM By: [name] Entered By: [name] on 11/28/2015 10:00:47 Theil, Verne D. ([id number]) -------------------------------------------------------------------------------- Wound Assessment Details Patient Name: Sprowl, Cheri D. Date of Service: 11/28/2015 9:30 AM Medical Record Number: [id number] Patient Account Number: [id number] Date of Birth/Sex: 03-29-1946 (70 y.o. Female) Treating RN: [name] Primary Care Physician: Glendon Axe Other Clinician: Referring Physician: Glendon Axe Treating Physician/Extender: Frann Rider in Treatment: 43 Wound Status Wound Number: 6 Primary  Diabetic Wound/Ulcer of the Lower Etiology: Extremity Wound Location: Right Lower Leg - Medial Wound Open Wounding Event: Gradually Appeared Status: Date Acquired: 10/10/2015 Comorbid Cataracts, Asthma, Coronary Artery Weeks Of Treatment: 7 History: Disease, Hypertension, Type II Clustered Wound: No Diabetes, Osteoarthritis, Neuropathy Photos Photo Uploaded By: [name] on 11/28/2015 11:42:50 Wound Measurements Length: (cm) 6 Width: (cm) 4.1 Depth: (cm) 0.2 Area: (cm) 19.321 Volume: (cm) 3.864 % Reduction in Area: -459.1% % Reduction in Volume: -1016.8% Epithelialization: None Tunneling: No Undermining: No Wound Description Classification: Grade 1 Wound Margin: Distinct, outline attached Exudate Amount: Medium Exudate Type: Purulent Exudate Color: yellow, brown, green Foul Odor After Cleansing: No Wound Bed Granulation Amount: None Present (0%) Exposed Structure Necrotic Amount: Large (67-100%) Fascia Exposed: No Necrotic Quality: Eschar, Adherent Slough Fat Layer Exposed: No Tendon Exposed: No Beer, Hera D. ([id number]) Muscle Exposed: No Joint Exposed: No Bone Exposed: No Limited to Skin Breakdown Periwound Skin Texture Texture Color No Abnormalities Noted: No No Abnormalities Noted: No Callus: No Atrophie Blanche: No Crepitus: No Cyanosis: No Excoriation: No Ecchymosis: No Fluctuance: No Erythema: No Friable: No Hemosiderin Staining: No Induration: No Mottled: No Localized Edema: No Pallor: No Rash: No Rubor: No Scarring: No Temperature / Pain Moisture Temperature: No Abnormality No Abnormalities Noted: No Tenderness on Palpation: Yes Dry / Scaly: No Maceration: No Moist: Yes Wound Preparation Ulcer Cleansing: Rinsed/Irrigated with Saline Topical Anesthetic Applied: None Treatment Notes Wound #6 (Right, Medial Lower Leg) 1. Cleansed with: Clean wound with Normal Saline 2. Anesthetic Topical Lidocaine 4% cream to wound bed  prior to debridement 4. Dressing Applied: Santyl Ointment 5. Secondary Dressing Applied Bordered Foam Dressing Dry Gauze Electronic Signature(s) Signed: 11/28/2015 5:19:21 PM By: [name] Entered By: [name] on 11/28/2015 10:01:17 Osso, Kilynn D. ([id number]) -------------------------------------------------------------------------------- Vitals Details Patient Name: Kristin Stare D. Date of Service: 11/28/2015 9:30 AM Medical Record Number: [id number] Patient Account Number: [id number] Date of Birth/Sex: 08-31-1946 (70 y.o. Female) Treating RN: [name] Primary Care Physician: Glendon Axe Other Clinician: Referring Physician: Glendon Axe Treating Physician/Extender: Frann Rider in Treatment: 42 Vital Signs Time Taken: 09:48 Temperature (F): 98.1 Height (in): 65 Pulse (bpm): 71 Weight (lbs): 248 Respiratory Rate (breaths/min): 18 Body Mass Index (BMI): 41.3 Blood Pressure (mmHg): 140/57 Reference Range: 80 - 120 mg / dl Electronic Signature(s) Signed: 11/28/2015 5:19:21 PM By: [name] Entered By: [name] on 11/28/2015 09:52:07

## 2015-12-05 ENCOUNTER — Encounter: Payer: Commercial Managed Care - HMO | Admitting: Surgery

## 2015-12-05 DIAGNOSIS — E11622 Type 2 diabetes mellitus with other skin ulcer: Secondary | ICD-10-CM | POA: Diagnosis not present

## 2015-12-06 NOTE — Progress Notes (Addendum)
[name], [name] ([id number]) Visit Report for 12/05/2015 Arrival Information Details Patient Name: [name], [name] Date of Service: 12/05/2015 1:30 PM Medical Record Number: [id number] Patient Account Number: [id number] Date of Birth/Sex: [birth year] (70 y.o. Female) Treating RN: [name] Primary Care Physician: Glendon Axe Other Clinician: Referring Physician: Glendon Axe Treating Physician/Extender: Frann Rider in Treatment: 37 Visit Information History Since Last Visit Added or deleted any medications: No Patient Arrived: Cane Any new allergies or adverse reactions: No Arrival Time: 14:08 Had a fall or experienced change in No Accompanied By: self activities of daily living that may affect Transfer Assistance: None risk of falls: Patient Identification Verified: Yes Signs or symptoms of abuse/neglect since last No Secondary Verification Process Yes visito Completed: Hospitalized since last visit: No Patient Requires Transmission- No Pain Present Now: No Based Precautions: Patient Has Alerts: Yes Patient Alerts: Patient on Blood Thinner Electronic Signature(s) Signed: 12/05/2015 5:06:47 PM By: [name] Entered By: [name] on 12/05/2015 14:08:30 [name], [name] ([id number]) -------------------------------------------------------------------------------- Encounter Discharge Information Details Patient Name: [name] Date of Service: 12/05/2015 1:30 PM Medical Record Number: [id number] Patient Account Number: [id number] Date of Birth/Sex: May 12, 1946 (70 y.o. Female) Treating RN: [name] Primary Care Physician: Glendon Axe Other Clinician: Referring Physician: Glendon Axe Treating Physician/Extender: Frann Rider in Treatment: 96 Encounter Discharge Information Items Discharge Pain Level: 0 Discharge Condition: Stable Ambulatory Status: Cane Discharge Destination: Home Transportation: Private Auto Accompanied  By: self Schedule Follow-up Appointment: Yes Medication Reconciliation completed No and provided to Patient/Care Loring Liskey: Provided on Clinical Summary of Care: 12/05/2015 Form Type Recipient Paper Patient LM Electronic Signature(s) Signed: 12/05/2015 4:41:21 PM By: [name] Previous Signature: 12/05/2015 2:52:38 PM Version By: Ruthine Dose Entered By: [name] on 12/05/2015 16:41:21 [name], [name] ([id number]) -------------------------------------------------------------------------------- Lower Extremity Assessment Details Patient Name: [name] Date of Service: 12/05/2015 1:30 PM Medical Record Number: [id number] Patient Account Number: [id number] Date of Birth/Sex: [birth year] (70 y.o. Female) Treating RN: [name] Primary Care Physician: Glendon Axe Other Clinician: Referring Physician: Glendon Axe Treating Physician/Extender: Frann Rider in Treatment: 44 Edema Assessment Assessed: [Left: No] [Right: No] Edema: [Left: Yes] [Right: Yes] Vascular Assessment Pulses: Posterior Tibial Dorsalis Pedis Palpable: [Left:Yes] [Right:Yes] Extremity colors, hair growth, and conditions: Extremity Color: [Left:Hyperpigmented] [Right:Hyperpigmented] Hair Growth on Extremity: [Left:No] [Right:No] Temperature of Extremity: [Left:Warm] [Right:Warm] Capillary Refill: [Left:< 3 seconds] [Right:< 3 seconds] Electronic Signature(s) Signed: 12/05/2015 5:06:47 PM By: [name] Entered By: [name] on 12/05/2015 14:24:12 [name], [name] ([id number]) -------------------------------------------------------------------------------- Multi Wound Chart Details Patient Name: [name] Date of Service: 12/05/2015 1:30 PM Medical Record Number: [id number] Patient Account Number: [id number] Date of Birth/Sex: 1946-03-04 (70 y.o. Female) Treating RN: [name] Primary Care Physician: Glendon Axe Other Clinician: Referring Physician: Glendon Axe Treating Physician/Extender: Frann Rider in Treatment: 44 Vital Signs Height(in): 65 Pulse(bpm): 86 Weight(lbs): 248 Blood Pressure 159/67 (mmHg): Body Mass Index(BMI): 41 Temperature(F): 98.3 Respiratory Rate 18 (breaths/min): Photos: [2:No Photos] [5:No Photos] [6:No Photos] Wound Location: [2:Left Lower Leg - Distal] [5:Right Lower Leg - Posterior] [6:Right Lower Leg - Medial] Wounding Event: [2:Blister] [5:Gradually Appeared] [6:Gradually Appeared] Primary Etiology: [2:Venous Leg Ulcer] [5:Venous Leg Ulcer] [6:Diabetic Wound/Ulcer of the Lower Extremity] Comorbid History: [2:Cataracts, Asthma, Coronary Artery Disease, Coronary Artery Disease, Coronary Artery Disease, Hypertension, Type II Diabetes, Osteoarthritis, Diabetes, Osteoarthritis, Diabetes, Osteoarthritis, Neuropathy] [5:Cataracts, Asthma,  Hypertension, Type II Neuropathy] [6:Cataracts, Asthma, Hypertension, Type II Neuropathy] Date Acquired: [2:12/30/2014] [5:04/29/2015] [6:10/10/2015] Weeks of Treatment: [2:44] [5:28] [6:8] Wound  Status: [2:Open] [5:Open] [6:Open] Clustered Wound: [2:Yes] [5:No] [6:No] Measurements L x W x [name] 1x0.7x0.1 [5:5.4x8.3x0.2] [6:6.1x4.4x0.2] (cm) Area (cm) : [2:0.55] [5:35.202] [6:21.08] Volume (cm) : [2:0.055] [5:7.04] [6:4.216] % Reduction in Area: [2:22.20%] [5:-18624.50%] [6:-510.00%] % Reduction in Volume: 22.50% [5:-36952.60%] [6:-1118.50%] Classification: [2:Full Thickness Without Exposed Support Structures] [5:Full Thickness Without Exposed Support Structures] [6:Grade 1] HBO Classification: [2:Grade 2] [5:Grade 2] [6:N/A] Exudate Amount: [2:Large] [5:Large] [6:Medium] Exudate Type: [2:Serous] [5:Serosanguineous] [6:Purulent] Exudate Color: [2:amber] [5:red, brown] [6:yellow, brown, green] Wound Margin: [2:Thickened] [5:Thickened] [6:Distinct, outline attached] Granulation Amount: [2:Large (67-100%)] [5:Large (67-100%)] [6:None Present (0%)] Granulation Quality:  Red, Pink Red, Pink N/A Necrotic Amount: None Present (0%) Small (1-33%) Large (67-100%) Necrotic Tissue: N/A Eschar, Adherent Slough Eschar, Adherent Slough Exposed Structures: Fascia: No N/A Fascia: No Fat: No Fat: No Tendon: No Tendon: No Muscle: No Muscle: No Joint: No Joint: No Bone: No Bone: No Limited to Skin Limited to Skin Breakdown Breakdown Epithelialization: None None None Periwound Skin Texture: Edema: Yes Edema: Yes Edema: No Scarring: Yes Scarring: Yes Excoriation: No Induration: No Callus: No Crepitus: No Fluctuance: No Friable: No Rash: No Scarring: No Periwound Skin Maceration: Yes Moist: Yes Moist: Yes Moisture: Moist: Yes Maceration: No Dry/Scaly: No Periwound Skin Color: No Abnormalities Noted No Abnormalities Noted Atrophie Blanche: No Cyanosis: No Ecchymosis: No Erythema: No Hemosiderin Staining: No Mottled: No Pallor: No Rubor: No Temperature: No Abnormality No Abnormality No Abnormality Tenderness on Yes Yes Yes Palpation: Wound Preparation: Ulcer Cleansing: Ulcer Cleansing: Ulcer Cleansing: Rinsed/Irrigated with Rinsed/Irrigated with Rinsed/Irrigated with Saline Saline Saline Topical Anesthetic Topical Anesthetic Topical Anesthetic Applied: Other: lidocaine Applied: Other: lidocaine Applied: None 4% 4% Procedures Performed: Cellular or Tissue Based Cellular or Tissue Based N/A Product Product Treatment Notes Electronic Signature(s) Signed: 12/05/2015 4:37:19 PM By: Dorthey Sawyer, Vaughan Basta [name] ([id number]) Entered By: [name] on 12/05/2015 16:37:19 [name], [name] ([id number]) -------------------------------------------------------------------------------- Multi-Disciplinary Care Plan Details Patient Name: WILLARD, NORDELL [name] Date of Service: 12/05/2015 1:30 PM Medical Record Number: [id number] Patient Account Number: [id number] Date of Birth/Sex: 02/11/1946 (70 y.o. Female) Treating RN: [name] Primary Care  Physician: Glendon Axe Other Clinician: Referring Physician: Glendon Axe Treating Physician/Extender: Frann Rider in Treatment: 71 Active Inactive Orientation to the Wound Care Program Nursing Diagnoses: Knowledge deficit related to the wound healing center program Goals: Patient/caregiver will verbalize understanding of the [facility] Date Initiated: 01/31/2015 Goal Status: Active Interventions: Provide education on orientation to the wound center Notes: Venous Leg Ulcer Nursing Diagnoses: Potential for venous Insuffiency (use before diagnosis confirmed) Goals: Non-invasive venous studies are completed as ordered Date Initiated: 01/31/2015 Goal Status: Active Patient/caregiver will verbalize understanding of disease process and disease management Date Initiated: 01/31/2015 Goal Status: Active Interventions: Assess peripheral edema status every visit. Notes: Wound/Skin Impairment Nursing Diagnoses: Impaired tissue integrity Knowledge deficit related to smoking impact on wound healing [name], [name] ([id number]) Goals: Patient/caregiver will verbalize understanding of skin care regimen Date Initiated: 01/31/2015 Goal Status: Active Ulcer/skin breakdown will heal within 14 weeks Date Initiated: 01/31/2015 Goal Status: Active Interventions: Assess ulceration(s) every visit Notes: Electronic Signature(s) Signed: 12/05/2015 4:37:12 PM By: [name] Entered By: [name] on 12/05/2015 16:37:12 [name], [name] ([id number]) -------------------------------------------------------------------------------- Patient/Caregiver Education Details Patient Name: [name] Date of Service: 12/05/2015 1:30 PM Medical Record Number: [id number] Patient Account Number: [id number] Date of Birth/Gender: 1946-05-31 (70 y.o. Female) Treating RN: [name] Primary Care Physician: Glendon Axe Other Clinician: Referring Physician: Glendon Axe Treating Physician/Extender: Frann Rider in  Treatment: 67 Education Assessment Education Provided To: Patient Education Topics Provided Wound/Skin Impairment: Handouts: Other: wound care as ordered over apligraf if needed Methods: Demonstration, Explain/Verbal Responses: State content correctly Electronic Signature(s) Signed: 12/05/2015 4:41:43 PM By: [name] Entered By: [name] on 12/05/2015 16:41:43 [name], [name] ([id number]) -------------------------------------------------------------------------------- Wound Assessment Details Patient Name: [name], [name] Date of Service: 12/05/2015 1:30 PM Medical Record Number: [id number] Patient Account Number: [id number] Date of Birth/Sex: Dec 01, 1945 (70 y.o. Female) Treating RN: [name] Primary Care Physician: Glendon Axe Other Clinician: Referring Physician: Glendon Axe Treating Physician/Extender: Frann Rider in Treatment: 59 Wound Status Wound Number: 2 Primary Venous Leg Ulcer Etiology: Wound Location: Left Lower Leg - Distal Wound Open Wounding Event: Blister Status: Date Acquired: 12/30/2014 Comorbid Cataracts, Asthma, Coronary Artery Weeks Of Treatment: 44 History: Disease, Hypertension, Type II Clustered Wound: Yes Diabetes, Osteoarthritis, Neuropathy Photos Photo Uploaded By: [name] on 12/05/2015 16:48:33 Wound Measurements Length: (cm) 1 Width: (cm) 0.7 Depth: (cm) 0.1 Area: (cm) 0.55 Volume: (cm) 0.055 % Reduction in Area: 22.2% % Reduction in Volume: 22.5% Epithelialization: None Tunneling: No Undermining: No Wound Description Full Thickness Without Classification: Exposed Support Structures Diabetic Severity Grade 2 (Wagner): Wound Margin: Thickened Exudate Amount: Large Exudate Type: Serous Exudate Color: amber Foul Odor After Cleansing: No Wound Bed Granulation Amount: Large (67-100%) Exposed Structure Drum, Tamora [name]  ([id number]) Granulation Quality: Red, Pink Fascia Exposed: No Necrotic Amount: None Present (0%) Fat Layer Exposed: No Tendon Exposed: No Muscle Exposed: No Joint Exposed: No Bone Exposed: No Limited to Skin Breakdown Periwound Skin Texture Texture Color No Abnormalities Noted: No No Abnormalities Noted: No Localized Edema: Yes Temperature / Pain Scarring: Yes Temperature: No Abnormality Moisture Tenderness on Palpation: Yes No Abnormalities Noted: No Maceration: Yes Moist: Yes Wound Preparation Ulcer Cleansing: Rinsed/Irrigated with Saline Topical Anesthetic Applied: Other: lidocaine 4%, Treatment Notes Wound #2 (Left, Distal Lower Leg) 1. Cleansed with: Clean wound with Normal Saline 2. Anesthetic Topical Lidocaine 4% cream to wound bed prior to debridement 3. Peri-wound Care: Skin Prep 4. Dressing Applied: Mepitel 5. Secondary Dressing Applied Bordered Foam Dressing Dry Gauze Notes apligraf applied by Dr Con Memos today Electronic Signature(s) Signed: 12/05/2015 5:06:47 PM By: [name] Entered By: [name] on 12/05/2015 14:23:16 [name], [name] ([id number]) -------------------------------------------------------------------------------- Wound Assessment Details Patient Name: [name], [name] Date of Service: 12/05/2015 1:30 PM Medical Record Number: [id number] Patient Account Number: [id number] Date of Birth/Sex: 18-Feb-1946 (70 y.o. Female) Treating RN: [name] Primary Care Physician: Glendon Axe Other Clinician: Referring Physician: Glendon Axe Treating Physician/Extender: Frann Rider in Treatment: 39 Wound Status Wound Number: 5 Primary Venous Leg Ulcer Etiology: Wound Location: Right Lower Leg - Posterior Wound Open Wounding Event: Gradually Appeared Status: Date Acquired: 04/29/2015 Comorbid Cataracts, Asthma, Coronary Artery Weeks Of Treatment: 28 History: Disease, Hypertension, Type II Clustered Wound: No Diabetes,  Osteoarthritis, Neuropathy Photos Photo Uploaded By: [name] on 12/05/2015 16:48:34 Wound Measurements Length: (cm) 5.4 Width: (cm) 8.3 Depth: (cm) 0.2 Area: (cm) 35.202 Volume: (cm) 7.04 % Reduction in Area: -18624.5% % Reduction in Volume: -36952.6% Epithelialization: None Tunneling: No Undermining: No Wound Description Full Thickness Without Exposed Foul Odor A Classification: Support Structures Diabetic Severity Grade 2 (Wagner): Wound Margin: Thickened Exudate Amount: Large Exudate Type: Serosanguineous Exudate Color: red, brown fter Cleansing: No Wound Bed Granulation Amount: Large (67-100%) Kaney, Navika [name] ([id number]) Granulation Quality: Red, Pink Necrotic Amount: Small (1-33%) Necrotic Quality: Eschar, Adherent Slough Periwound Skin Texture Texture Color No Abnormalities Noted: No No Abnormalities  Noted: No Localized Edema: Yes Temperature / Pain Scarring: Yes Temperature: No Abnormality Moisture Tenderness on Palpation: Yes No Abnormalities Noted: No Moist: Yes Wound Preparation Ulcer Cleansing: Rinsed/Irrigated with Saline Topical Anesthetic Applied: Other: lidocaine 4%, Treatment Notes Wound #5 (Right, Posterior Lower Leg) 1. Cleansed with: Clean wound with Normal Saline 2. Anesthetic Topical Lidocaine 4% cream to wound bed prior to debridement 3. Peri-wound Care: Skin Prep 4. Dressing Applied: Mepitel 5. Secondary Dressing Applied Bordered Foam Dressing Dry Gauze Notes apligraf applied by Dr Con Memos today Electronic Signature(s) Signed: 12/05/2015 5:06:47 PM By: [name] Entered By: [name] on 12/05/2015 14:23:35 Boberg, Maaliyah [name] ([id number]) -------------------------------------------------------------------------------- Wound Assessment Details Patient Name: KIMOTHY, CROKE [name] Date of Service: 12/05/2015 1:30 PM Medical Record Number: [id number] Patient Account Number: [id number] Date of Birth/Sex: 08-09-1946 (70 y.o.  Female) Treating RN: [name] Primary Care Physician: Glendon Axe Other Clinician: Referring Physician: Glendon Axe Treating Physician/Extender: Frann Rider in Treatment: 76 Wound Status Wound Number: 6 Primary Diabetic Wound/Ulcer of the Lower Etiology: Extremity Wound Location: Right Lower Leg - Medial Wound Open Wounding Event: Gradually Appeared Status: Date Acquired: 10/10/2015 Comorbid Cataracts, Asthma, Coronary Artery Weeks Of Treatment: 8 History: Disease, Hypertension, Type II Clustered Wound: No Diabetes, Osteoarthritis, Neuropathy Photos Photo Uploaded By: [name] on 12/05/2015 16:48:50 Wound Measurements Length: (cm) 6.1 Width: (cm) 4.4 Depth: (cm) 0.2 Area: (cm) 21.08 Volume: (cm) 4.216 % Reduction in Area: -510% % Reduction in Volume: -1118.5% Epithelialization: None Tunneling: No Undermining: No Wound Description Classification: Grade 1 Wound Margin: Distinct, outline attached Exudate Amount: Medium Exudate Type: Purulent Exudate Color: yellow, brown, green Foul Odor After Cleansing: No Wound Bed Granulation Amount: None Present (0%) Exposed Structure Necrotic Amount: Large (67-100%) Fascia Exposed: No Necrotic Quality: Eschar, Adherent Slough Fat Layer Exposed: No Tendon Exposed: No Aughenbaugh, Jonie [name] ([id number]) Muscle Exposed: No Joint Exposed: No Bone Exposed: No Limited to Skin Breakdown Periwound Skin Texture Texture Color No Abnormalities Noted: No No Abnormalities Noted: No Callus: No Atrophie Blanche: No Crepitus: No Cyanosis: No Excoriation: No Ecchymosis: No Fluctuance: No Erythema: No Friable: No Hemosiderin Staining: No Induration: No Mottled: No Localized Edema: No Pallor: No Rash: No Rubor: No Scarring: No Temperature / Pain Moisture Temperature: No Abnormality No Abnormalities Noted: No Tenderness on Palpation: Yes Dry / Scaly: No Maceration: No Moist: Yes Wound  Preparation Ulcer Cleansing: Rinsed/Irrigated with Saline Topical Anesthetic Applied: None Treatment Notes Wound #6 (Right, Medial Lower Leg) 1. Cleansed with: Clean wound with Normal Saline 2. Anesthetic Topical Lidocaine 4% cream to wound bed prior to debridement 4. Dressing Applied: Santyl Ointment 5. Secondary Dressing Applied Bordered Foam Dressing Dry Gauze Electronic Signature(s) Signed: 12/05/2015 5:06:47 PM By: [name] Entered By: [name] on 12/05/2015 14:23:48 Thul, Jose [name] ([id number]) -------------------------------------------------------------------------------- Vitals Details Patient Name: [name] Date of Service: 12/05/2015 1:30 PM Medical Record Number: [id number] Patient Account Number: [id number] Date of Birth/Sex: 20-Jul-1946 (71 y.o. Female) Treating RN: [name] Primary Care Physician: Glendon Axe Other Clinician: Referring Physician: Glendon Axe Treating Physician/Extender: Frann Rider in Treatment: 44 Vital Signs Time Taken: 14:09 Temperature (F): 98.3 Height (in): 65 Pulse (bpm): 86 Weight (lbs): 248 Respiratory Rate (breaths/min): 18 Body Mass Index (BMI): 41.3 Blood Pressure (mmHg): 159/67 Reference Range: 80 - 120 mg / dl Electronic Signature(s) Signed: 12/05/2015 5:06:47 PM By: [name] Entered By: [name] on 12/05/2015 14:12:05

## 2015-12-06 NOTE — Progress Notes (Addendum)
MARIJEAN, DUHON ([id number]) Visit Report for 12/05/2015 Chief Complaint Document Details Patient Name: [name], [name] 12/05/2015 1:30 Date of Service: PM Medical Record [id number] Number: Patient Account Number: [id number] Nov 13, 1945 (70 y.o. Treating RN: [name] Date of Birth/Sex: Female) Other Clinician: Primary Care Physician: [facility], Delana Meyer Treating [name] Referring Physician: Glendon Axe Physician/Extender: Weeks in Treatment: 50 Information Obtained from: Patient Chief Complaint Patient presents to the wound care center for a consult due non healing wound 69 year old patient comes with a history of having a ulcer on the left lower extremity for the past 4 weeks. she says she's had swelling of both lower extremities for about a year after she started having prednisone. 02/07/2015 -- her vascular appointments obtained were in the first and third week of June. she is able to go to [place] and we will try and get her some earlier appointments. Other than that nothing else has changed in her management. Electronic Signature(s) Signed: 12/05/2015 1:44:30 PM By: [name], [name] Entered By: [name] on 12/05/2015 13:44:30 Rashid, CHAMEKA GUNNER ([id number]) -------------------------------------------------------------------------------- Cellular or Tissue Based Product Details Patient Name: [name], [name] 12/05/2015 1:30 Date of Service: PM Medical Record [id number] Number: Patient Account Number: [id number] 05/10/1946 (70 y.o. Treating RN: [name] Date of Birth/Sex: Female) Other Clinician: Primary Care Physician: [facility], Delana Meyer Treating [name] Referring Physician: Glendon Axe Physician/Extender: Weeks in Treatment: 44 Cellular or Tissue Based Wound #2 Left,Distal Lower Leg Product Type Applied to: Performed By: Physician [name], MD Cellular or Tissue Based Apligraf Product Type: Time-Out Taken: Yes Location: genitalia /  hands / feet / multiple digits Wound Size (sq cm): 0.7 Product Size (sq cm): 44 Waste Size (sq cm): 43 Waste Reason: wound size Amount of Product Applied (sq cm): 1 Lot #: [id number] Expiration Date: 12/11/2015 Fenestrated: Yes Instrument: Blade Reconstituted: Yes Solution Type: saline Solution Amount: 33ml Lot #: [id number] Solution Expiration 06/25/2017 Date: Secured: Yes Secured With: Steri-Strips Dressing Applied: Yes Primary Dressing: mepitel one Procedural Pain: 0 Post Procedural Pain: 0 Response to Treatment: Procedure was tolerated well Post Procedure Diagnosis Same as Pre-procedure Electronic Signature(s) Signed: 12/05/2015 2:57:38 PM By: [name], [name] Tosh, Marita D. ([id number]) Entered By: [name] on 12/05/2015 14:57:38 Vester, Mylee DMarland Kitchen ([id number]) -------------------------------------------------------------------------------- Cellular or Tissue Based Product Details Patient Name: KANISHA, KRANING D. 12/05/2015 1:30 Date of Service: PM Medical Record [id number] Number: Patient Account Number: [id number] 11-06-1945 (70 y.o. Treating RN: [name] Date of Birth/Sex: Female) Other Clinician: Primary Care Physician: [facility], Delana Meyer Treating [name] Referring Physician: Glendon Axe Physician/Extender: Weeks in Treatment: 44 Cellular or Tissue Based Wound #5 Right,Posterior Lower Leg Product Type Applied to: Performed By: Physician [name], MD Cellular or Tissue Based Apligraf Product Type: Time-Out Taken: Yes Location: genitalia / hands / feet / multiple digits Wound Size (sq cm): 44.82 Product Size (sq cm): 43 Waste Size (sq cm): 0 Amount of Product Applied (sq cm): 43 Lot #: [id number] Expiration Date: 12/11/2015 Fenestrated: Yes Instrument: Blade Reconstituted: Yes Solution Type: saline Solution Amount: 41ml Lot #: [id number] Solution Expiration 06/25/2017 Date: Secured: Yes Secured With: Steri-Strips Dressing Applied:  Yes Primary Dressing: mepitel one Procedural Pain: 0 Post Procedural Pain: 0 Response to Treatment: Procedure was tolerated well Post Procedure Diagnosis Same as Pre-procedure Electronic Signature(s) Signed: 12/05/2015 2:57:45 PM By: [name], [name] Entered By: [name] on 12/05/2015 14:57:45 Long, Arlayne D. ([id number]) Bracy, Ahlaya D. ([id number]) -------------------------------------------------------------------------------- HPI Details Patient Name: Kristin Stare D. 12/05/2015 1:30 Date of Service:  PM Medical Record [id number] Number: Patient Account Number: [id number] 25-Jan-1946 (70 y.o. Treating RN: [name] Date of Birth/Sex: Female) Other Clinician: Primary Care Physician: [facility], Delana Meyer Treating Katrece Roediger Referring Physician: Glendon Axe Physician/Extender: Weeks in Treatment: 22 History of Present Illness HPI Description: 70 year old patient who is known to have diabetes mellitus type 2, chronic renal insufficiency, coronary artery disease, hypertension, hypercholesterolemia, temporal arteritis and inflammatory arthritiss also has a history of having a hysterectomy and some orthopedic related surgeries. The ulcer on the left lower extremity started off as a blister and then. Got progressively worse. She does not have any fever or chills and has not had any recent surgical intervention for this. Her last hemoglobin A1c was 10.1 in September 2015. She has been recently put on doxycycline by her PCP. She is now also allergic to doxycycline and was this was changed over to Keflex. 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 She does see a cardiologist and also takes a diuretic. 02/07/2015 her arterial and venous duplex studies to be done have dates been given as the first and third week of June. This is at [facility]. We are going to try  and get early appointments at [facility]. other than that nothing has changed in her management. 02/14/2015 -- we have been able to get her an appointment in [facility] on May 20 which is much earlier than her previous ones at [place]. She continues with her prednisone and her sugars are in the range of 150-200. 02/21/2015 We were able to get a vascular lab workup for her today and she is going to be there at 2:00 this afternoon. the swelling of her leg has gone down significantly but she still has some tenderness over the wounds. 02/28/2015 - She has had one of two vascular workups done, and this coming Tuesday has another, at [facility]. She continues to be on steroid medications. She has significant sensitivity in her left lower extremity and has pain suggestive of neuropathic pain and I have asked her to address this with her primary care physician. 03/07/2015 -- The patient saw Dr. Lucky Cowboy for a consultation and he has had her arteries are okay but she has 2 incompetent veins on the left lower extremity and he is going to set her up for surgery. Official report is awaited. Addendum: Official reports are now available and on 03/04/2015. She was seen and lower extremity venous duplex exam was done. There was reflux present within the left greater saphenous vein below the knee and also the left small saphenous vein. Arterial duplex showed normal triphasic waveforms throughout the left lower extremity without any significant stenosis. Her ABIs were noncompressible bilaterally but a waveforms were normal and a digital pressures were normal bilaterally consistent with no significant arterial insufficiency. He has recommended endovenous ablation of both the left small saphenous and the left great saphenous vein. This would still be scheduled later. 03/14/2015 -- she has heard back from the vascular office and has surgery scheduled for sometime in July. PURA, NESBITT  ([id number]) Her rheumatologist has decreased her prednisone dosage but she still on it. She has also had cataract surgery in her right eye recently this week. 03/20/2015 - No new complaints today. Pain improved. No fever or chills. Tolerating 2 layer compression. 04/14/2015 -- she was doing very well today she went off on vacation and  now her edema has increased markedly the ulceration is bigger and her diabetes is not under control. 04/21/2015 -- I spoke to her PCP Dr. Candiss Norse and discussed the management which would include being seen by a general surgeon for debridement and taking multiple punch biopsies which would help in establishing the diagnosis of this is a vasculitis. She is agreeable about this and will set her up for the procedure with Dr. Tamala Julian at [facility]. She was seen by the surgeon Dr. Jamal Collin. 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 She is scheduled to have laser ablation done next week.Pt was sent here for possible biopsy to look for vasculitis. Feel it would be better to wait after laser ablation is completed- the ulcer may heal fully and biopsy may not be necessary 04/29/2015 -- she had the venous ablation done by Dr. Lucky Cowboy last Friday and we do not have any notes yet. She is doing fine otherwise. 05/06/2015 --Review of her recent vascular intervention shows that she was seen by Dr. Lucky Cowboy on 04/29/2015. The follow-up duplex which was done showed that both the great saphenous vein and the small saphenous vein remained patent with reflux consistent with an unsuccessful ablation. He has rescheduled her for another the endovenous ablation to be done in about 4 weekso time. 05/13/2015 -- he was seen by her surgeon Dr. Jamal Collin who asked her to continue with conservative therapy and he would speak to Dr. Lucky Cowboy about her management. Dr. Lucky Cowboy is going to schedule her  surgery in the middle of August for a repeat endovenous ablation. Her pus culture from last week has grown : Chisholm her noted her sensitivity report but due to her multiple allergies I had tried clindamycin and she developed a rash with this too. She has been prescribed and anti-buttocks in the ER and is has it at home and she will let is know what she is going to be taking. 05/20/2015 -- she has developed a small spot on her right lower extremity but besides that it is not a full fledged ulceration. She did not get to see Dr. Lucky Cowboy last week and hopefully she will see him in the near future. 05/27/2015 -she is still awaiting her appointment with Dr.Dew and her vascular procedure is not scheduled until August 19. She will be seeing her PCP tomorrow and I have asked her to convey our discussion so that she is aware that debridement has not been done yet. 06/03/2015 -- was seen by her rheumatologist Dr. 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 He is lowering her prednisone to 12-1/2 mg for 1 month and then 10 mg per the next month. I will again make an attempt to speak to her PCP Dr. Candiss Norse and her surgeon Dr. 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 06/17/2015 -- Dr.Dew did her vascular procedure last week and a follow-up venous ultrasound shows good resolution of the veins as per the patient's history. He is to see her back in 2 weeks. MARSHAY, BUTTRY ([id number]) 07/07/2015. -- the patient has had a heavy growth of Proteus mirabilis and Enterococcus faecalis. These are sensitive to several drugs but the problem  is she has allergies to all of these and hence I would like her to see  Dr. Ola Spurr for this. She is also due to see Dr. Lucky Cowboy tomorrow and I will discussed the management with him including debridement under anesthesia and possible biopsies. 07/14/2015 -- she has an appointment to see Dr. Ola Spurr tomorrow and did see Dr. Bunnie Domino PA who will discuss my request with him. 07/21/2015 -- saw Dr. Ola Spurr was able to do a test on her and has put her on amoxicillin. She has been tolerating that and has had no problems with allergies to this. 07/28/2015 -- Last Friday I spoke to Dr. Leotis Pain regarding her care and he said that her right leg did not need any surgery and on the left leg was doing pretty good. We did agree that if she undergoes any procedure in the future he would do a couple of punch biopsies of the wound. 08/18/2015 -- her right leg is very tender and there is significant amount of slough. The left leg is looking much better 09/02/2015 -- she is going to have a debridement and punch biopsies of her right lower extremity by Dr. Leotis Pain this coming Thursday. Also seen Dr. Ola Spurr who has continued her on ciprofloxacin. 09/09/2015 -- on 09/04/2015 Dr. Leotis Pain took her to surgery - Irrigation and excisional debridement of skin, soft tissue, and muscle to about 40 cm2 to the right posterior calf with biopsy. Pathology results are -- DIAGNOSIS: SKIN, RIGHT LOWER EXTREMITY; BIOPSY: SKIN AND SOFT TISSUE WITH ULCERATION, SEE NOTE. - NEGATIVE FOR DYSPLASIA AND MALIGNANCY. Note: There is acute inflammation and ulceration of the epidermis. The dermis shows nonspecific inflammation, neovascularization, and hemosiderin deposition. The differential diagnosis for these findings includes stasis dermatitis, nonspecific dermatitis, and infection. Correlation with clinical findings is required. A PAS fungal stain is obtained and results will be reported in an addendum. cultures were also sent and this grew Pseudomonas aeruginosa, Escherichia coli, Proteus  mirabilis, Klebsiella pneumoniae and they were all sensitive to ciprofloxacin which she is on. 09/16/2015 -- he saw Dr. 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 He is going to call the patient in and discuss with her the management possibly with Imuran. I will continue treating her locally. 09/23/2015 -- she saw Dr. Ola Spurr today who was going to continue the antibiotics for now and stop after this course. She has an appointment to see Dr. Jefm Bryant in about a week's time. Her wound VAC has arrived but she did not bring it with her today. We will try and set her up for changes to the right leg 3 times a week. She is here for her first application of Apligraf to the left lower extremity. 09/30/2015 -- she has seen Dr. 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 because she is ambulatory at home health will not apply a wound VAC and she will have to come here 3 times a week on Monday Wednesday and Friday. 10/10/2015 -- she is here for a second application of Apligraf to her left lower extremity. 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 Jefm Bryant. Other than that she is doing well 10/31/2015 -- she is here for a third application of Apligraf to her left lower extremity. after reviewing  her wound on the right side I noted that it is granulating extremely well and we will use the remnants of the Apligraf on the right leg. CASSIA, SISSOM ([id number]) 11/14/2015 -- she was recently admitted to the hospital between January 11 and 11/10/2015 for uncontrolled hypertension, diabetes mellitus, acute kidney injury. during her admission she was supported by wound care and also by antibiotics. Around this time her immunosuppression  was stopped by Dr. Nunzio Cory. She is feeling much better now. 11/21/2015 -- she has had her fourth application of Apligraf today and it has been shared between the left and the right leg 11/24/2015 -- was here for a wound VAC change but it was noticed that she had profuse bleeding from the lobe medial part of her right lower extremity and I was called in to evaluate her. 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 She has been doing fine since then Electronic Signature(s) Signed: 12/05/2015 1:44:39 PM By: [name], [name] Entered By: [name] on 12/05/2015 13:44:39 Allshouse, KEYLANI ILYAS ([id number]) -------------------------------------------------------------------------------- Physical Exam Details Patient Name: ETHELEEN, VANDENBOOM D. 12/05/2015 1:30 Date of Service: PM Medical Record [id number] Number: Patient Account Number: [id number] 1945-11-04 (70 y.o. Treating RN: [name] Date of Birth/Sex: Female) Other Clinician: Primary Care Physician: [facility], Delana Meyer Treating [name] Referring Physician: Glendon Axe Physician/Extender: Weeks in Treatment: 44 Constitutional . Pulse regular. Respirations normal and unlabored. Afebrile. . Eyes Nonicteric. Reactive to light. Ears, Nose, Mouth, and Throat Lips, teeth, and gums WNL.Marland Kitchen Moist mucosa without lesions. Neck supple and nontender. No palpable supraclavicular or cervical adenopathy. Normal sized without goiter. Respiratory WNL. No retractions.. Cardiovascular Pedal Pulses WNL. No clubbing, cyanosis or edema. Lymphatic No adneopathy. No adenopathy. No adenopathy. Musculoskeletal Adexa without tenderness or enlargement.. Digits and nails w/o clubbing, cyanosis, infection, petechiae, ischemia, or inflammatory conditions.. Integumentary (Hair, Skin) No suspicious lesions. No crepitus or fluctuance. No peri-wound warmth or erythema.  No masses.Marland Kitchen Psychiatric Judgement and insight Intact.. No evidence of depression, anxiety, or agitation.. Notes the wound in the left lower extremity is looking excellent and was was cleant with moist saline gauze. The posterior-lateral wound on the right leg is also very clean and this was also washed out with saline gauze. Wound on the medial part of the right lower extremity where there was previous bleeding has a lot of slough and this is going to need debridement with Santyl ointment. Electronic Signature(s) Signed: 12/05/2015 2:58:14 PM By: [name], [name] Previous Signature: 12/05/2015 1:45:55 PM Version By: [name], [name] Entered By: [name] on 12/05/2015 14:58:14 SHAWONNA, DRENNER ([id number]) -------------------------------------------------------------------------------- Physician Orders Details Patient Name: ATIA, KOLANOWSKI D. 12/05/2015 1:30 Date of Service: PM Medical Record [id number] Number: Patient Account Number: [id number] Nov 07, 1945 (70 y.o. Treating RN: [name] Date of Birth/Sex: Female) Other Clinician: Primary Care Physician: [facility], Delana Meyer Treating Providencia Hottenstein Referring Physician: Glendon Axe Physician/Extender: Weeks in Treatment: 47 Verbal / Phone Orders: Yes Clinician: [name] Read Back and Verified: Yes Diagnosis Coding ICD-10 Coding Code Description E11.622 Type 2 diabetes mellitus with other skin ulcer L97.322 Non-pressure chronic ulcer of left ankle with fat layer exposed E66.01 Morbid (severe) obesity due to excess calories I89.0 Lymphedema, not elsewhere classified L03.116 Cellulitis of left lower limb L97.212 Non-pressure chronic ulcer of right calf with fat layer exposed I87.311 Chronic venous hypertension (idiopathic) with ulcer of right lower extremity Wound Cleansing Wound #2 Left,Distal Lower Leg o  Cleanse wound with mild soap and water o May shower with protection. Wound #5 Right,Posterior Lower  Leg o Cleanse wound with mild soap and water o May shower with protection. Wound #6 Right,Medial Lower Leg o Cleanse wound with mild soap and water o May shower with protection. Skin Barriers/Peri-Wound Care Wound #2 Left,Distal Lower Leg o Skin Prep Wound #5 Right,Posterior Lower Leg o Skin Prep Wound #6 Right,Medial Lower Leg o Skin Prep Ferreri, Bessye D. ([id number]) Primary Wound Dressing Wound #2 Left,Distal Lower Leg o Mepitel One - over apligraf Wound #5 Right,Posterior Lower Leg o Drawtex o Mepitel One - over apligraf Wound #6 Right,Medial Lower Leg o Santyl Ointment Secondary Dressing Wound #2 Left,Distal Lower Leg o Dry Gauze o Boardered Foam Dressing Wound #5 Right,Posterior Lower Leg o Dry Gauze o Boardered Foam Dressing Wound #6 Right,Medial Lower Leg o Dry Gauze o Boardered Foam Dressing Dressing Change Frequency Wound #2 Left,Distal Lower Leg o Change dressing every week - and as needed Wound #5 Right,Posterior Lower Leg o Change dressing every week - and as needed Wound #6 Right,Medial Lower Leg o Change dressing every day. Follow-up Appointments Wound #2 Left,Distal Lower Leg o Return Appointment in 1 week. o Other: - nurse visit Monday and Wednesday Wound #5 Right,Posterior Lower Leg o Return Appointment in 1 week. o Other: - nurse visit Monday and Wednesday Wound #6 Right,Medial Lower Leg o Return Appointment in 1 week. o Other: - nurse visit Monday and Wednesday Edema Control Nuss, Camila D. ([id number]) Wound #2 Left,Distal Lower Leg o Patient to wear own compression stockings Wound #5 Right,Posterior Lower Leg o Patient to wear own compression stockings Wound #6 Right,Medial Lower Leg o Patient to wear own compression stockings Advanced Therapies Wound #2 Left,Distal Lower Leg o Apligraf application in clinic; including contact layer, fixation with steri strips, dry gauze  and cover dressing. Wound #5 Right,Posterior Lower Leg o Apligraf application in clinic; including contact layer, fixation with steri strips, dry gauze and cover dressing. Electronic Signature(s) Signed: 12/05/2015 4:39:59 PM By: [name] Signed: 12/05/2015 4:42:12 PM By: [name], [name] Entered By: [name] on 12/05/2015 16:39:59 Foxworth, ROYELLE WADE ([id number]) -------------------------------------------------------------------------------- Problem List Details Patient Name: SHADOE, NEWGENT D. 12/05/2015 1:30 Date of Service: PM Medical Record [id number] Number: Patient Account Number: [id number] 1946-06-18 (70 y.o. Treating RN: [name] Date of Birth/Sex: Female) Other Clinician: Primary Care Physician: Glendon Axe Treating [name] Referring Physician: Glendon Axe Physician/Extender: Weeks in Treatment: 47 Active Problems ICD-10 Encounter Code Description Active Date Diagnosis E11.622 Type 2 diabetes mellitus with other skin ulcer 01/31/2015 Yes L97.322 Non-pressure chronic ulcer of left ankle with fat layer 01/31/2015 Yes exposed E66.01 Morbid (severe) obesity due to excess calories 01/31/2015 Yes I89.0 Lymphedema, not elsewhere classified 01/31/2015 Yes L03.116 Cellulitis of left lower limb 07/07/2015 Yes L97.212 Non-pressure chronic ulcer of right calf with fat layer 11/24/2015 Yes exposed I87.311 Chronic venous hypertension (idiopathic) with ulcer of 11/24/2015 Yes right lower extremity Inactive Problems Resolved Problems ICD-10 Code Description Active Date Resolved Date I83.222 Varicose veins of left lower extremity with both ulcer of 03/07/2015 03/07/2015 calf and inflammation CARLETTE, DARK ([id number]) I83.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 with both ulcer of 03/07/2015 03/07/2015 ankle and inflammation Electronic Signature(s) Signed: 12/05/2015 1:44:22 PM By: [name], [name] Entered By: [name] on 12/05/2015  13:44:22 Rhyne, Tijuana D. ([id number]) -------------------------------------------------------------------------------- Progress Note Details Patient Name: HIDAYA, TAK D. 12/05/2015 1:30 Date of Service: PM Medical Record [id number] Number: Patient Account Number:  [id number] 1946/06/25 (70 y.o. Treating RN: [name] Date of Birth/Sex: Female) Other Clinician: Primary Care Physician: [facility], Delana Meyer Treating [name] Referring Physician: Glendon Axe Physician/Extender: Weeks in Treatment: 76 Subjective Chief Complaint Information obtained from Patient Patient presents to the wound care center for a consult due non healing wound 70 year old patient comes with a history of having a ulcer on the left lower extremity for the past 4 weeks. she says she's had swelling of both lower extremities for about a year after she started having prednisone. 02/07/2015 -- her vascular appointments obtained were in the first and third week of June. she is able to go to [place] and we will try and get her some earlier appointments. Other than that nothing else has changed in her management. History of Present Illness (HPI) 70 year old patient who is known to have diabetes mellitus type 2, chronic renal insufficiency, coronary artery disease, hypertension, hypercholesterolemia, temporal arteritis and inflammatory arthritiss also has a history of having a hysterectomy and some orthopedic related surgeries. The ulcer on the left lower extremity started off as a blister and then. Got progressively worse. She does not have any fever or chills and has not had any recent surgical intervention for this. Her last hemoglobin A1c was 10.1 in September 2015. She has been recently put on doxycycline by her PCP. She is now also allergic to doxycycline and was this was changed over to Keflex. due to her temporal arteritis she has been on prednisone for about a year and she says ever since that she has  had swelling of both lower extremities. She does see a cardiologist and also takes a diuretic. 02/07/2015 her arterial and venous duplex studies to be done have dates been given as the first and third week of June. This is at [facility]. We are going to try and get early appointments at [facility] other than that nothing has changed in her management. 02/14/2015 -- we have been able to get her an appointment in [facility] on May 20 which is much earlier than her previous ones at [place]. She continues with her prednisone and her sugars are in the range of 150-200. 02/21/2015 We were able to get a vascular lab workup for her today and she is going to be there at 2:00 this afternoon. the swelling of her leg has gone down significantly but she still has some tenderness over the wounds. 02/28/2015 - She has had one of two vascular workups done, and this coming Tuesday has another, at [facility]. She continues to be on steroid medications. She has significant sensitivity in her left lower extremity and has pain suggestive of neuropathic pain and I have asked her to address this with her primary care physician. 03/07/2015 -- The patient saw Dr. Lucky Cowboy for a consultation and he has had her arteries are okay but she has 2 incompetent veins on the left lower extremity and he is going to set her up for surgery. Official report is ALEIDA, SCHATZLE ([id number]) awaited. Addendum: Official reports are now available and on 03/04/2015. She was seen and lower extremity venous duplex exam was done. There was reflux present within the left greater saphenous vein below the knee and also the left small saphenous vein. Arterial duplex showed normal triphasic waveforms throughout the left lower extremity without any significant stenosis. Her ABIs were noncompressible bilaterally but a waveforms were normal and a digital pressures were normal bilaterally consistent with no significant  arterial insufficiency. He has recommended endovenous  ablation of both the left small saphenous and the left great saphenous vein. This would still be scheduled later. 03/14/2015 -- she has heard back from the vascular office and has surgery scheduled for sometime in July. Her rheumatologist has decreased her prednisone dosage but she still on it. She has also had cataract surgery in her right eye recently this week. 03/20/2015 - No new complaints today. Pain improved. No fever or chills. Tolerating 2 layer compression. 04/14/2015 -- she was doing very well today she went off on vacation and now her edema has increased markedly the ulceration is bigger and her diabetes is not under control. 04/21/2015 -- I spoke to her PCP Dr. Candiss Norse and discussed the management which would include being seen by a general surgeon for debridement and taking multiple punch biopsies which would help in establishing the diagnosis of this is a vasculitis. She is agreeable about this and will set her up for the procedure with Dr. Tamala Julian at [facility]. She was seen by the surgeon Dr. Jamal Collin. 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 She is scheduled to have laser ablation done next week.Pt was sent here for possible biopsy to look for vasculitis. Feel it would be better to wait after laser ablation is completed- the ulcer may heal fully and biopsy may not be necessary 04/29/2015 -- she had the venous ablation done by Dr. Lucky Cowboy last Friday and we do not have any notes yet. She is doing fine otherwise. 05/06/2015 --Review of her recent vascular intervention shows that she was seen by Dr. Lucky Cowboy on 04/29/2015. The follow-up duplex which was done showed that both the great saphenous vein and the small saphenous vein remained patent with reflux consistent with an unsuccessful ablation. He has rescheduled her  for another the endovenous ablation to be done in about 4 weeks time. 05/13/2015 -- he was seen by her surgeon Dr. Jamal Collin who asked her to continue with conservative therapy and he would speak to Dr. Lucky Cowboy about her management. Dr. Lucky Cowboy is going to schedule her surgery in the middle of August for a repeat endovenous ablation. Her pus culture from last week has grown : Udell her noted her sensitivity report but due to her multiple allergies I had tried clindamycin and she developed a rash with this too. She has been prescribed and anti-buttocks in the ER and is has it at home and she will let is know what she is going to be taking. 05/20/2015 -- she has developed a small spot on her right lower extremity but besides that it is not a full fledged ulceration. She did not get to see Dr. Lucky Cowboy last week and hopefully she will see him in the near future. 05/27/2015 -she is still awaiting her appointment with Dr.Dew and her vascular procedure is not scheduled until August 19. She will be seeing her PCP tomorrow and I have asked her to convey our discussion so that she is aware that debridement has not been done yet. ZANI, CASHIN ([id number]) 06/03/2015 -- was seen by her rheumatologist Dr. 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 He is lowering her prednisone to 12-1/2 mg for 1 month and then 10 mg per the next month. I will again make an attempt to speak  to her PCP Dr. Candiss Norse and her surgeon Dr. 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 06/17/2015 -- Dr.Dew did her vascular procedure last week and a follow-up venous ultrasound shows good resolution of the veins as per the patient's  history. He is to see her back in 2 weeks. 07/07/2015. -- the patient has had a heavy growth of Proteus mirabilis and Enterococcus faecalis. These are sensitive to several drugs but the problem is she has allergies to all of these and hence I would like her to see Dr. Ola Spurr for this. She is also due to see Dr. Lucky Cowboy tomorrow and I will discussed the management with him including debridement under anesthesia and possible biopsies. 07/14/2015 -- she has an appointment to see Dr. Ola Spurr tomorrow and did see Dr. Bunnie Domino PA who will discuss my request with him. 07/21/2015 -- saw Dr. Ola Spurr was able to do a test on her and has put her on amoxicillin. She has been tolerating that and has had no problems with allergies to this. 07/28/2015 -- Last Friday I spoke to Dr. Leotis Pain regarding her care and he said that her right leg did not need any surgery and on the left leg was doing pretty good. We did agree that if she undergoes any procedure in the future he would do a couple of punch biopsies of the wound. 08/18/2015 -- her right leg is very tender and there is significant amount of slough. The left leg is looking much better 09/02/2015 -- she is going to have a debridement and punch biopsies of her right lower extremity by Dr. Leotis Pain this coming Thursday. Also seen Dr. Ola Spurr who has continued her on ciprofloxacin. 09/09/2015 -- on 09/04/2015 Dr. Leotis Pain took her to surgery - Irrigation and excisional debridement of skin, soft tissue, and muscle to about 40 cm2 to the right posterior calf with biopsy. Pathology results are -- DIAGNOSIS: SKIN, RIGHT LOWER EXTREMITY; BIOPSY: SKIN AND SOFT TISSUE WITH ULCERATION, SEE NOTE. - NEGATIVE FOR DYSPLASIA AND MALIGNANCY. Note: There is acute inflammation and ulceration of the epidermis. The dermis shows nonspecific inflammation, neovascularization, and hemosiderin deposition. The differential diagnosis for these findings includes stasis  dermatitis, nonspecific dermatitis, and infection. Correlation with clinical findings is required. A PAS fungal stain is obtained and results will be reported in an addendum. cultures were also sent and this grew Pseudomonas aeruginosa, Escherichia coli, Proteus mirabilis, Klebsiella pneumoniae and they were all sensitive to ciprofloxacin which she is on. 09/16/2015 -- he saw Dr. 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 He is going to call the patient in and discuss with her the management possibly with Imuran. I will continue treating her locally. 09/23/2015 -- she saw Dr. Ola Spurr today who was going to continue the antibiotics for now and stop after this course. She has an appointment to see Dr. Jefm Bryant in about a week's time. Her wound VAC has arrived but she did not bring it with her today. We will try and set her up for changes to the right leg 3 times a week. She is here for her first application of Apligraf to the left lower extremity. DESTINEE, SUING ([id number]) 09/30/2015 -- she has seen Dr. 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 because she is ambulatory at home health will not apply a wound VAC and she will have to come here 3 times a week on Monday Wednesday and Friday. 10/10/2015 -- she is here for a second application of Apligraf to her left lower extremity. 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 Jefm Bryant. Other than that she is doing well 10/31/2015 -- she is here for a third application of Apligraf to her left lower extremity. after reviewing her wound on the right side I noted that it is granulating extremely well and we will use the remnants of the Apligraf on the right leg. 11/14/2015 --  she was recently admitted to the hospital between January 11 and 11/10/2015 for uncontrolled hypertension, diabetes mellitus, acute kidney injury. during her admission she was supported by wound care and also by antibiotics. Around this time her immunosuppression was stopped by Dr. Nunzio Cory. She is feeling much better now. 11/21/2015 -- she has had her fourth application of Apligraf today and it has been shared between the left and the right leg 11/24/2015 -- was here for a wound VAC change but it was noticed that she had profuse bleeding from the lobe medial part of her right lower extremity and I was called in to evaluate her. 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 She has been doing fine since then Objective Constitutional Pulse regular. Respirations normal and unlabored. Afebrile. Vitals Time Taken: 2:09 PM, Height: 65 in, Weight: 248 lbs, BMI: 41.3, Temperature: 98.3 F, Pulse: 86 bpm, Respiratory Rate: 18 breaths/min, Blood Pressure: 159/67 mmHg. Eyes Nonicteric. Reactive to light. Ears, Nose, Mouth, and Throat Lips, teeth, and gums WNL.Marland Kitchen Moist mucosa without lesions. Neck supple and nontender. No palpable supraclavicular or cervical adenopathy. Normal sized without goiter. Respiratory WNL. No retractions.. Cardiovascular Pedal Pulses WNL. No clubbing, cyanosis or edema. TARRAH, GAIL D. ([id number]) Lymphatic No adneopathy. No adenopathy. No adenopathy. Musculoskeletal Adexa without tenderness or enlargement.. Digits and nails w/o clubbing, cyanosis, infection, petechiae, ischemia, or inflammatory conditions.Marland Kitchen Psychiatric Judgement and insight Intact.. No evidence of depression, anxiety, or agitation.. General Notes: the wound in the left lower extremity is looking excellent and was was cleant with moist saline gauze. The posterior-lateral wound on the right leg is also very clean  and this was also washed out with saline gauze. Wound on the medial part of the right lower extremity where there was previous bleeding has a lot of slough and this is going to need debridement with Santyl ointment. Integumentary (Hair, Skin) No suspicious lesions. No crepitus or fluctuance. No peri-wound warmth or erythema. No masses.. Wound #2 status is Open. Original cause of wound was Blister. The wound is located on the Left,Distal Lower Leg. The wound measures 1cm length x 0.7cm width x 0.1cm depth; 0.55cm^2 area and 0.055cm^3 volume. The wound is limited to skin breakdown. There is no tunneling or undermining noted. There is a large amount of serous drainage noted. The wound margin is thickened. There is large (67-100%) red, pink granulation within the wound bed. There is no necrotic tissue within the wound bed. The periwound skin appearance exhibited: Localized Edema, Scarring, Maceration, Moist. Periwound temperature was noted as No Abnormality. The periwound has tenderness on palpation. Wound #5 status is Open. Original cause of wound was Gradually Appeared. The wound is located on the [place]  Lower Leg. The wound measures 5.4cm length x 8.3cm width x 0.2cm depth; 35.202cm^2 area and 7.04cm^3 volume. There is no tunneling or undermining noted. There is a large amount of serosanguineous drainage noted. The wound margin is thickened. There is large (67-100%) red, pink granulation within the wound bed. There is a small (1-33%) amount of necrotic tissue within the wound bed including Eschar and Adherent Slough. The periwound skin appearance exhibited: Localized Edema, Scarring, Moist. Periwound temperature was noted as No Abnormality. The periwound has tenderness on palpation. Wound #6 status is Open. Original cause of wound was Gradually Appeared. The wound is located on the Right,Medial Lower Leg. The wound measures 6.1cm length x 4.4cm width x 0.2cm depth; 21.08cm^2 area and  4.216cm^3 volume. The wound is limited to skin breakdown. There is no tunneling or undermining noted. There is a medium amount of purulent drainage noted. The wound margin is distinct with the outline attached to the wound base. There is no granulation within the wound bed. There is a large (67-100%) amount of necrotic tissue within the wound bed including Eschar and Adherent Slough. The periwound skin appearance exhibited: Moist. The periwound skin appearance did not exhibit: Callus, Crepitus, Excoriation, Fluctuance, Friable, Induration, Localized Edema, Rash, Scarring, Dry/Scaly, Maceration, Atrophie Blanche, Cyanosis, Ecchymosis, Hemosiderin Staining, Mottled, Pallor, Rubor, Erythema. Periwound temperature was noted as No Abnormality. The periwound has tenderness on palpation. 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 It was a clean area in the right posterior lateral part. The anterior medial also still has a lot of slough and will be treated with Santyl ointment. She will continue to see as now at weekly intervals and we will start using her allotted Apligraf o5 to the right posterior lateral wound. he is cautioned about keeping the wounds dry and will use her compression stockings. Procedures Wound #2 Wound #2 is a Venous Leg Ulcer located on the Left,Distal Lower Leg. A skin graft procedure using  a bioengineered skin substitute/cellular or tissue based product was performed by [name], MD. Apligraf was applied and secured with Steri-Strips. 1 sq cm of product was utilized and 43 sq cm was wasted due to wound size. Post Application, mepitel one was applied. A Time Out was conducted prior to the start of the procedure. The procedure was tolerated well with a pain level of 0 throughout and a pain level of 0 following the procedure. Post procedure Diagnosis Wound #2: Same as Pre-Procedure . Wound #5 Wound #5 is a Venous Leg Ulcer located on the Right,Posterior Lower Leg. A skin graft procedure using a bioengineered skin substitute/cellular or tissue based product was performed by [name], MD. Apligraf was applied and secured with Steri-Strips. 43 sq cm of product was utilized and 0 sq cm was wasted. Post Application, mepitel one was applied. A Time Out was conducted prior to the start of the procedure. The procedure was tolerated well with a pain level of 0 throughout and a pain  level of 0 following the procedure. Post procedure Diagnosis Wound #5: Same as Pre-Procedure . HEATHERMARIE, SCHISLER ([id number]) Plan Wound Cleansing: Wound #2 Left,Distal Lower Leg: Cleanse wound with mild soap and water May shower with protection. Wound #5 Right,Posterior Lower Leg: Cleanse wound with mild soap and water May shower with protection. Wound #6 Right,Medial Lower Leg: Cleanse wound with mild soap and water May shower with protection. Skin Barriers/Peri-Wound Care: Wound #2 Left,Distal Lower Leg: Skin Prep Wound #5 Right,Posterior Lower Leg: Skin Prep Wound #6 Right,Medial Lower Leg: Skin Prep Primary Wound Dressing: Wound #2 Left,Distal Lower Leg: Mepitel One - over apligraf Wound #5 Right,Posterior Lower Leg: Drawtex Mepitel One - over apligraf Wound #6 Right,Medial Lower Leg: Santyl Ointment Secondary Dressing: Wound #2 Left,Distal Lower Leg: Dry Gauze Boardered Foam  Dressing Wound #5 Right,Posterior Lower Leg: Dry Gauze Boardered Foam Dressing Wound #6 Right,Medial Lower Leg: Dry Gauze Boardered Foam Dressing Dressing Change Frequency: Wound #2 Left,Distal Lower Leg: Change dressing every week - and as needed Wound #5 Right,Posterior Lower Leg: Change dressing every week - and as needed Wound #6 Right,Medial Lower Leg: Change dressing every day. Follow-up Appointments: Wound #2 Left,Distal Lower Leg: Return Appointment in 1 week. TACIE, KORNEGAY ([id number]) Other: - nurse visit Monday and Wednesday Wound #5 Right,Posterior Lower Leg: Return Appointment in 1 week. Other: - nurse visit Monday and Wednesday Wound #6 Right,Medial Lower Leg: Return Appointment in 1 week. Other: - nurse visit Monday and Wednesday Edema Control: Wound #2 Left,Distal Lower Leg: Patient to wear own compression stockings Wound #5 Right,Posterior Lower Leg: Patient to wear own compression stockings Wound #6 Right,Medial Lower Leg: Patient to wear own compression stockings Advanced Therapies: Wound #2 Left,Distal Lower Leg: Apligraf application in clinic; including contact layer, fixation with steri strips, dry gauze and cover dressing. Wound #5 Right,Posterior Lower Leg: Apligraf application in clinic; including contact layer, fixation with steri strips, dry gauze and cover dressing. She has had a fifth application of Apligraf to the left lower extremity and the remnants of it were applied to the right leg. It was a clean area in the right posterior lateral part. The anterior medial also still has a lot of slough and will be treated with Santyl ointment. She will continue to see as now at weekly intervals and we will start using her allotted Apligraf o5 to the right posterior lateral wound. he is cautioned about keeping the wounds dry and will use her compression stockings. Electronic Signature(s) Signed: 12/05/2015 4:43:29 PM By: [name],  [name] Previous Signature: 12/05/2015 2:59:48 PM Version By: [name], [name] Entered By: [name] on 12/05/2015 16:43:29 Maher, Athina D. ([id number]) -------------------------------------------------------------------------------- SuperBill Details Patient Name: KIRSTY, DAGG D. Date of Service: 12/05/2015 Medical Record Number: [id number] Patient Account Number: [id number] Date of Birth/Sex: 06-Dec-1945 (70 y.o. Female) Treating RN: [name] Primary Care Physician: Glendon Axe Other Clinician: Referring Physician: Glendon Axe Treating Physician/Extender: Frann Rider in Treatment: 44 Diagnosis Coding ICD-10 Codes Code Description E11.622 Type 2 diabetes mellitus with other skin ulcer L97.322 Non-pressure chronic ulcer of left ankle with fat layer exposed E66.01 Morbid (severe) obesity due to excess calories I89.0 Lymphedema, not elsewhere classified L03.116 Cellulitis of left lower limb L97.212 Non-pressure chronic ulcer of right calf with fat layer exposed I87.311 Chronic venous hypertension (idiopathic) with ulcer of right lower extremity Facility Procedures CPT4 Code Description: [id number] (Facility Use Only) Apligraf 1 SQ CM Modifier: Quantity: 87 CPT4 Code Description: [id number] - SKIN SUB GRAFT FACE/NK/HF/G  ICD-10 Description Diagnosis E11.622 Type 2 diabetes mellitus with other skin ulcer L97.322 Non-pressure chronic ulcer of left ankle with fat la L97.212 Non-pressure chronic ulcer of  right calf with fat la I87.311 Chronic venous hypertension (idiopathic) with ulcer Modifier: yer exposed yer exposed of right lowe Quantity: 1 r extremity CPT4 Code Description: [id number] - SKIN SUB GRAFT F/N/HF/G ADDL ICD-10 Description Diagnosis E11.622 Type 2 diabetes mellitus with other skin ulcer L97.322 Non-pressure chronic ulcer of left ankle with fat la L97.212 Non-pressure chronic ulcer of  right calf with fat la I87.311 Chronic venous hypertension  (idiopathic) with ulcer Modifier: yer exposed yer exposed of right lowe Quantity: 1 r extremity Physician Procedures CPT4 Code Description: [id number] - WC PHYS SKIN SUB GRAFT FACE/NK/HF/G Description CHAUNICE, IERARDI ([id number]) Modifier: Quantity: 1 Electronic Signature(s) Signed: 12/05/2015 3:00:46 PM By: [name], [name] Entered By: [name] on 12/05/2015 15:00:46

## 2015-12-10 DIAGNOSIS — L88 Pyoderma gangrenosum: Secondary | ICD-10-CM | POA: Insufficient documentation

## 2015-12-12 ENCOUNTER — Encounter: Payer: Self-pay | Admitting: General Surgery

## 2015-12-12 ENCOUNTER — Encounter (HOSPITAL_BASED_OUTPATIENT_CLINIC_OR_DEPARTMENT_OTHER): Payer: Commercial Managed Care - HMO | Admitting: General Surgery

## 2015-12-12 DIAGNOSIS — I83022 Varicose veins of left lower extremity with ulcer of calf: Secondary | ICD-10-CM | POA: Diagnosis not present

## 2015-12-12 DIAGNOSIS — E11622 Type 2 diabetes mellitus with other skin ulcer: Secondary | ICD-10-CM | POA: Diagnosis not present

## 2015-12-12 DIAGNOSIS — I87313 Chronic venous hypertension (idiopathic) with ulcer of bilateral lower extremity: Secondary | ICD-10-CM | POA: Diagnosis not present

## 2015-12-12 DIAGNOSIS — L97919 Non-pressure chronic ulcer of unspecified part of right lower leg with unspecified severity: Secondary | ICD-10-CM

## 2015-12-12 DIAGNOSIS — L97929 Non-pressure chronic ulcer of unspecified part of left lower leg with unspecified severity: Secondary | ICD-10-CM

## 2015-12-12 DIAGNOSIS — I83012 Varicose veins of right lower extremity with ulcer of calf: Secondary | ICD-10-CM | POA: Diagnosis not present

## 2015-12-12 DIAGNOSIS — L97219 Non-pressure chronic ulcer of right calf with unspecified severity: Principal | ICD-10-CM

## 2015-12-12 DIAGNOSIS — L97229 Non-pressure chronic ulcer of left calf with unspecified severity: Secondary | ICD-10-CM

## 2015-12-12 NOTE — Progress Notes (Signed)
seeiheal 

## 2015-12-13 NOTE — Progress Notes (Signed)
[name] ([id number]) Visit Report for 12/12/2015 Arrival Information Details Patient Name: [name], [name] Date of Service: 12/12/2015 10:15 AM Medical Record Number: [id number] Patient Account Number: [id number] Date of Birth/Sex: 12-Jun-1946 (70 y.o. Female) Treating RN: [name] Primary Care Physician: [name] Other Clinician: Referring Physician: Glendon [name] Treating Physician/Extender: [name] in Treatment: 53 Visit Information History Since Last Visit Added or deleted any medications: No Patient Arrived: Cane Any new allergies or adverse reactions: No Arrival Time: 10:22 Had a fall or experienced change in No Accompanied By: self activities of daily living that may affect Transfer Assistance: None risk of falls: Patient Identification Verified: Yes Signs or symptoms of abuse/neglect since last No Secondary Verification Process Yes visito Completed: Hospitalized since last visit: No Patient Requires Transmission- No Pain Present Now: No Based Precautions: Patient Has Alerts: Yes Patient Alerts: Patient on Blood Thinner Electronic Signature(s) Signed: 12/12/2015 4:52:57 PM By: [name] Entered By: [name] on 12/12/2015 10:24:26 [name], [name] ([id number]) -------------------------------------------------------------------------------- Clinic Level of Care Assessment Details Patient Name: Kristin Stare D. Date of Service: 12/12/2015 10:15 AM Medical Record Number: [id number] Patient Account Number: [id number] Date of Birth/Sex: 1946-04-10 (70 y.o. Female) Treating RN: [name] Primary Care Physician: [name] Other Clinician: Referring Physician: Glendon [name] Treating Physician/Extender: [name] in Treatment: 45 Clinic Level of Care Assessment Items TOOL 4 Quantity Score []  - Use when only an EandM is performed on FOLLOW-UP visit 0 ASSESSMENTS - Nursing Assessment / Reassessment X -  Reassessment of Co-morbidities (includes updates in patient status) 1 10 X - Reassessment of Adherence to Treatment Plan 1 5 ASSESSMENTS - Wound and Skin Assessment / Reassessment []  - Simple Wound Assessment / Reassessment - one wound 0 X - Complex Wound Assessment / Reassessment - multiple wounds 3 5 []  - Dermatologic / Skin Assessment (not related to wound area) 0 ASSESSMENTS - Focused Assessment []  - Circumferential Edema Measurements - multi extremities 0 []  - Nutritional Assessment / Counseling / Intervention 0 X - Lower Extremity Assessment (monofilament, tuning fork, pulses) 1 5 []  - Peripheral Arterial Disease Assessment (using hand held doppler) 0 ASSESSMENTS - Ostomy and/or Continence Assessment and Care []  - Incontinence Assessment and Management 0 []  - Ostomy Care Assessment and Management (repouching, etc.) 0 PROCESS - Coordination of Care X - Simple Patient / Family Education for ongoing care 1 15 []  - Complex (extensive) Patient / Family Education for ongoing care 0 []  - Staff obtains Programmer, systems, Records, Test Results / Process Orders 0 []  - Staff telephones HHA, Nursing Homes / Clarify orders / etc 0 []  - Routine Transfer to another Facility (non-emergent condition) 0 [name], [name] ([id number]) []  - Routine Hospital Admission (non-emergent condition) 0 []  - New Admissions / Biomedical engineer / Ordering NPWT, Apligraf, etc. 0 []  - Emergency Hospital Admission (emergent condition) 0 X - Simple Discharge Coordination 1 10 []  - Complex (extensive) Discharge Coordination 0 PROCESS - Special Needs []  - Pediatric / Minor Patient Management 0 []  - Isolation Patient Management 0 []  - Hearing / Language / Visual special needs 0 []  - Assessment of Community assistance (transportation, D/C planning, etc.) 0 []  - Additional assistance / Altered mentation 0 []  - Support Surface(s) Assessment (bed, cushion, seat, etc.) 0 INTERVENTIONS - Wound Cleansing / Measurement []  -  Simple Wound Cleansing - one wound 0 X - Complex Wound Cleansing - multiple wounds 3 5 X - Wound Imaging (photographs - any number of wounds) 1 5 []  -  Wound Tracing (instead of photographs) 0 []  - Simple Wound Measurement - one wound 0 X - Complex Wound Measurement - multiple wounds 3 5 INTERVENTIONS - Wound Dressings X - Small Wound Dressing one or multiple wounds 3 10 []  - Medium Wound Dressing one or multiple wounds 0 []  - Large Wound Dressing one or multiple wounds 0 X - Application of Medications - topical 1 5 []  - Application of Medications - injection 0 INTERVENTIONS - Miscellaneous []  - External ear exam 0 [name], [name] ([id number]) []  - Specimen Collection (cultures, biopsies, blood, body fluids, etc.) 0 []  - Specimen(s) / Culture(s) sent or taken to Lab for analysis 0 []  - Patient Transfer (multiple staff / [name]) 0 []  - Simple Staple / Suture removal (25 or less) 0 []  - Complex Staple / Suture removal (26 or more) 0 []  - Hypo / Hyperglycemic Management (close monitor of Blood Glucose) 0 []  - Ankle / Brachial Index (ABI) - do not check if billed separately 0 X - Vital Signs 1 5 Has the patient been seen at the hospital within the last three years: Yes Total Score: 135 Level Of Care: New/Established - Level 4 Electronic Signature(s) Signed: 12/12/2015 4:30:05 PM By: [name] Entered By: [name] on 12/12/2015 16:30:05 [name], [name] ([id number]) -------------------------------------------------------------------------------- Encounter Discharge Information Details Patient Name: Kristin Stare D. Date of Service: 12/12/2015 10:15 AM Medical Record Number: [id number] Patient Account Number: [id number] Date of Birth/Sex: 1946-10-06 (70 y.o. Female) Treating RN: [name] Primary Care Physician: [name] Other Clinician: Referring Physician: Glendon [name] Treating Physician/Extender: [name] in Treatment:  82 Encounter Discharge Information Items Discharge Pain Level: 0 Discharge Condition: Stable Ambulatory Status: Cane Discharge Destination: Home Transportation: Private Auto Accompanied By: self Schedule Follow-up Appointment: Yes Medication Reconciliation completed No and provided to Patient/Care Leamon Palau: Provided on Clinical Summary of Care: 12/12/2015 Form Type Recipient Paper Patient LM Electronic Signature(s) Signed: 12/12/2015 4:31:23 PM By: [name] Previous Signature: 12/12/2015 12:42:46 PM Version By: Judene Companion MD Previous Signature: 12/12/2015 11:04:21 AM Version By: Ruthine Dose Entered By: [name] on 12/12/2015 16:31:23 [name], [name] ([id number]) -------------------------------------------------------------------------------- Lower Extremity Assessment Details Patient Name: [name], [name] Date of Service: 12/12/2015 10:15 AM Medical Record Number: [id number] Patient Account Number: [id number] Date of Birth/Sex: 1946-03-17 (70 y.o. Female) Treating RN: [name] Primary Care Physician: [name] Other Clinician: Referring Physician: Glendon [name] Treating Physician/Extender: [name] in Treatment: 45 Edema Assessment Assessed: [Left: No] [Right: No] Edema: [Left: Yes] [Right: Yes] Vascular Assessment Pulses: Posterior Tibial Dorsalis Pedis Palpable: [Left:Yes] [Right:Yes] Extremity colors, hair growth, and conditions: Extremity Color: [Left:Hyperpigmented] [Right:Hyperpigmented] Hair Growth on Extremity: [Left:No] [Right:No] Temperature of Extremity: [Left:Warm] [Right:Warm] Capillary Refill: [Left:< 3 seconds] [Right:< 3 seconds] Electronic Signature(s) Signed: 12/12/2015 4:52:57 PM By: [name] Entered By: [name] on 12/12/2015 10:38:37 [name], [name] ([id number]) -------------------------------------------------------------------------------- Multi Wound Chart Details Patient Name: Kristin Stare  D. Date of Service: 12/12/2015 10:15 AM Medical Record Number: [id number] Patient Account Number: [id number] Date of Birth/Sex: [birth year] (70 y.o. Female) Treating RN: [name] Primary Care Physician: [name] Other Clinician: Referring Physician: Glendon [name] Treating Physician/Extender: [name] in Treatment: 45 Vital Signs Height(in): 65 Pulse(bpm): 64 Weight(lbs): 248 Blood Pressure 157/61 (mmHg): Body Mass Index(BMI): 41 Temperature(F): 98.1 Respiratory Rate 18 (breaths/min): Photos: [2:No Photos] [5:No Photos] [6:No Photos] Wound Location: [2:Left, Distal Lower Leg] [5:Right, Posterior Lower Leg] [6:Right Lower Leg - Medial] Wounding Event: [2:Blister] [5:Gradually Appeared] [6:Gradually Appeared]  Primary Etiology: [2:Venous Leg Ulcer] [5:Venous Leg Ulcer] [6:Diabetic Wound/Ulcer of the Lower Extremity] Comorbid History: [2:N/A] [5:N/A] [6:Cataracts, Asthma, Coronary Artery Disease, Hypertension, Type II Diabetes, Osteoarthritis, Neuropathy] Date Acquired: [2:12/30/2014] [5:04/29/2015] [6:10/10/2015] Weeks of Treatment: [2:45] [5:29] [6:9] Wound Status: [2:Open] [5:Open] [6:Open] Clustered Wound: [2:Yes] [5:No] [6:No] Measurements L x W x D 1x0.7x0.1 [5:5.4x8.3x0.2] [6:6.2x4.2x0.2] (cm) Area (cm) : [2:0.55] [5:35.202] [6:20.452] Volume (cm) : [2:0.055] [5:7.04] [6:4.09] % Reduction in Area: [2:22.20%] [5:-18624.50%] [6:-491.80%] % Reduction in Volume: 22.50% [5:-36952.60%] [6:-1082.10%] Classification: [2:Full Thickness Without Exposed Support Structures] [5:Full Thickness Without Exposed Support Structures] [6:Grade 1] Exudate Amount: [2:N/A] [5:N/A] [6:Medium] Exudate Type: [2:N/A] [5:N/A] [6:Purulent] Exudate Color: [2:N/A] [5:N/A] [6:yellow, brown, green] Wound Margin: [2:N/A] [5:N/A] [6:Distinct, outline attached] Granulation Amount: [2:N/A] [5:N/A] [6:None Present (0%)] Necrotic Amount: [2:N/A] [5:N/A] [6:Large (67-100%)] Necrotic  Tissue: N/A N/A Eschar, Adherent Slough Epithelialization: N/A N/A None Periwound Skin Texture: No Abnormalities Noted No Abnormalities Noted Edema: No Excoriation: No Induration: No Callus: No Crepitus: No Fluctuance: No Friable: No Rash: No Scarring: No Periwound Skin No Abnormalities Noted No Abnormalities Noted Moist: Yes Moisture: Maceration: No Dry/Scaly: No Periwound Skin Color: No Abnormalities Noted No Abnormalities Noted Atrophie Blanche: No Cyanosis: No Ecchymosis: No Erythema: No Hemosiderin Staining: No Mottled: No Pallor: No Rubor: No Temperature: N/A N/A No Abnormality Tenderness on No No Yes Palpation: Wound Preparation: N/A N/A Ulcer Cleansing: Rinsed/Irrigated with Saline Topical Anesthetic Applied: None Treatment Notes Electronic Signature(s) Signed: 12/12/2015 4:52:57 PM By: [name] Entered By: [name] on 12/12/2015 10:39:41 [name], [name] ([id number]) -------------------------------------------------------------------------------- Multi-Disciplinary Care Plan Details Patient Name: GAYLYNN, CANNONE D. Date of Service: 12/12/2015 10:15 AM Medical Record Number: [id number] Patient Account Number: [id number] Date of Birth/Sex: 01-09-1946 (70 y.o. Female) Treating RN: [name] Primary Care Physician: [name] Other Clinician: Referring Physician: Glendon [name] Treating Physician/Extender: [name] in Treatment: 47 Active Inactive Orientation to the Wound Care Program Nursing Diagnoses: Knowledge deficit related to the wound healing center program Goals: Patient/caregiver will verbalize understanding of the [facility] Date Initiated: 01/31/2015 Goal Status: Active Interventions: Provide education on orientation to the wound center Notes: Venous Leg Ulcer Nursing Diagnoses: Potential for venous Insuffiency (use before diagnosis confirmed) Goals: Non-invasive venous studies are completed as  ordered Date Initiated: 01/31/2015 Goal Status: Active Patient/caregiver will verbalize understanding of disease process and disease management Date Initiated: 01/31/2015 Goal Status: Active Interventions: Assess peripheral edema status every visit. Notes: Wound/Skin Impairment Nursing Diagnoses: Impaired tissue integrity Knowledge deficit related to smoking impact on wound healing [name], [name] ([id number]) Goals: Patient/caregiver will verbalize understanding of skin care regimen Date Initiated: 01/31/2015 Goal Status: Active Ulcer/skin breakdown will heal within 14 weeks Date Initiated: 01/31/2015 Goal Status: Active Interventions: Assess ulceration(s) every visit Notes: Electronic Signature(s) Signed: 12/12/2015 4:52:57 PM By: [name] Entered By: [name] on 12/12/2015 10:39:19 [name], [name] ([id number]) -------------------------------------------------------------------------------- Patient/Caregiver Education Details Patient Name: Kristin Stare D. Date of Service: 12/12/2015 10:15 AM Medical Record Number: [id number] Patient Account Number: [id number] Date of Birth/Gender: October 04, 1946 (70 y.o. Female) Treating RN: [name] Primary Care Physician: [name] Other Clinician: Referring Physician: Glendon [name] Treating Physician/Extender: [name] in Treatment: 69 Education Assessment Education Provided To: Patient Education Topics Provided Wound/Skin Impairment: Handouts: Other: wound care as rodered Methods: Demonstration, Explain/Verbal Responses: State content correctly Electronic Signature(s) Signed: 12/12/2015 4:31:39 PM By: [name] Previous Signature: 12/12/2015 12:42:46 PM Version By: Judene Companion MD Entered By: [name] on 12/12/2015 16:31:39 [name], [name] ([id number]) -------------------------------------------------------------------------------- Wound  Assessment Details Patient Name: [name], KRONINGER. Date of  Service: 12/12/2015 10:15 AM Medical Record Number: [id number] Patient Account Number: [id number] Date of Birth/Sex: May 05, 1946 (70 y.o. Female) Treating RN: [name] Primary Care Physician: [name] Other Clinician: Referring Physician: Glendon [name] Treating Physician/Extender: [name] in Treatment: 45 Wound Status Wound Number: 2 Primary Etiology: Venous Leg Ulcer Wound Location: Left, Distal Lower Leg Wound Status: Open Wounding Event: Blister Date Acquired: 12/30/2014 Weeks Of Treatment: 45 Clustered Wound: Yes Photos Photo Uploaded By: [name] on 12/12/2015 11:14:59 Wound Measurements Length: (cm) 1 Width: (cm) 0.7 Depth: (cm) 0.1 Area: (cm) 0.55 Volume: (cm) 0.055 % Reduction in Area: 22.2% % Reduction in Volume: 22.5% Wound Description Full Thickness Without Exposed Classification: Support Structures Periwound Skin Texture Texture Color No Abnormalities Noted: No No Abnormalities Noted: No Moisture No Abnormalities Noted: No Treatment Notes Wound #2 (Left, Distal Lower Leg) Atha, Georgenia D. ([id number]) 1. Cleansed with: Clean wound with Normal Saline 4. Dressing Applied: Dry Gauze Other dressing (specify in notes) 5. Secondary Dressing Applied Bordered Foam Dressing Notes drawtex Electronic Signature(s) Signed: 12/12/2015 4:52:57 PM By: [name] Entered By: [name] on 12/12/2015 10:38:17 Sanz, Velna D. ([id number]) -------------------------------------------------------------------------------- Wound Assessment Details Patient Name: Lipsett, Carleena D. Date of Service: 12/12/2015 10:15 AM Medical Record Number: [id number] Patient Account Number: [id number] Date of Birth/Sex: 03-27-1946 (70 y.o. Female) Treating RN: [name] Primary Care Physician: [name] Other Clinician: Referring Physician: Glendon [name] Treating Physician/Extender: [name] in Treatment: 45 Wound Status Wound  Number: 5 Primary Etiology: Venous Leg Ulcer Wound Location: Right, Posterior Lower Leg Wound Status: Open Wounding Event: Gradually Appeared Date Acquired: 04/29/2015 Weeks Of Treatment: 29 Clustered Wound: No Photos Photo Uploaded By: [name] on 12/12/2015 11:15:00 Wound Measurements Length: (cm) 5.4 Width: (cm) 8.3 Depth: (cm) 0.2 Area: (cm) 35.202 Volume: (cm) 7.04 % Reduction in Area: -18624.5% % Reduction in Volume: -36952.6% Wound Description Full Thickness Without Exposed Classification: Support Structures Periwound Skin Texture Texture Color No Abnormalities Noted: No No Abnormalities Noted: No Moisture No Abnormalities Noted: No Treatment Notes Wound #5 (Right, Posterior Lower Leg) Kidney, Berlene D. ([id number]) 1. Cleansed with: Clean wound with Normal Saline 4. Dressing Applied: Dry Gauze Other dressing (specify in notes) 5. Secondary Dressing Applied Bordered Foam Dressing Notes drawtex Electronic Signature(s) Signed: 12/12/2015 4:52:57 PM By: [name] Entered By: [name] on 12/12/2015 10:38:18 Oshel, Bridney D. ([id number]) -------------------------------------------------------------------------------- Wound Assessment Details Patient Name: Mortell, Yobana D. Date of Service: 12/12/2015 10:15 AM Medical Record Number: [id number] Patient Account Number: [id number] Date of Birth/Sex: 02-02-1946 (70 y.o. Female) Treating RN: [name] Primary Care Physician: [name] Other Clinician: Referring Physician: Glendon [name] Treating Physician/Extender: [name] in Treatment: 45 Wound Status Wound Number: 6 Primary Diabetic Wound/Ulcer of the Lower Etiology: Extremity Wound Location: Right Lower Leg - Medial Wound Open Wounding Event: Gradually Appeared Status: Date Acquired: 10/10/2015 Comorbid Cataracts, Asthma, Coronary Artery Weeks Of Treatment: 9 History: Disease, Hypertension, Type II Clustered Wound:  No Diabetes, Osteoarthritis, Neuropathy Photos Photo Uploaded By: [name] on 12/12/2015 11:15:18 Wound Measurements Length: (cm) 6.2 Width: (cm) 4.2 Depth: (cm) 0.2 Area: (cm) 20.452 Volume: (cm) 4.09 % Reduction in Area: -491.8% % Reduction in Volume: -1082.1% Epithelialization: None Tunneling: No Undermining: No Wound Description Classification: Grade 1 Wound Margin: Distinct, outline attached Exudate Amount: Medium Exudate Type: Purulent Exudate Color: yellow, brown, green Foul Odor After Cleansing: No Wound Bed Granulation Amount: None Present (0%) Exposed Structure Necrotic Amount: Large (67-100%)  Fascia Exposed: No Necrotic Quality: Eschar, Adherent Slough Fat Layer Exposed: No Tendon Exposed: No Mcdevitt, Glada D. ([id number]) Muscle Exposed: No Joint Exposed: No Bone Exposed: No Limited to Skin Breakdown Periwound Skin Texture Texture Color No Abnormalities Noted: No No Abnormalities Noted: No Callus: No Atrophie Blanche: No Crepitus: No Cyanosis: No Excoriation: No Ecchymosis: No Fluctuance: No Erythema: No Friable: No Hemosiderin Staining: No Induration: No Mottled: No Localized Edema: No Pallor: No Rash: No Rubor: No Scarring: No Temperature / Pain Moisture Temperature: No Abnormality No Abnormalities Noted: No Tenderness on Palpation: Yes Dry / Scaly: No Maceration: No Moist: Yes Wound Preparation Ulcer Cleansing: Rinsed/Irrigated with Saline Topical Anesthetic Applied: None Treatment Notes Wound #6 (Right, Medial Lower Leg) 1. Cleansed with: Clean wound with Normal Saline 4. Dressing Applied: Santyl Ointment Other dressing (specify in notes) 5. Secondary Dressing Applied Bordered Foam Dressing Notes drawtex Electronic Signature(s) Signed: 12/12/2015 4:52:57 PM By: [name] Entered By: [name] on 12/12/2015 10:38:57 Roeder, Lillyn D.  ([id number]) -------------------------------------------------------------------------------- Vitals Details Patient Name: Kristin Stare D. Date of Service: 12/12/2015 10:15 AM Medical Record Number: [id number] Patient Account Number: [id number] Date of Birth/Sex: Jan 11, 1946 (70 y.o. Female) Treating RN: [name] Primary Care Physician: [name] Other Clinician: Referring Physician: Glendon [name] Treating Physician/Extender: [name] in Treatment: 45 Vital Signs Time Taken: 10:24 Temperature (F): 98.1 Height (in): 65 Pulse (bpm): 64 Weight (lbs): 248 Respiratory Rate (breaths/min): 18 Body Mass Index (BMI): 41.3 Blood Pressure (mmHg): 157/61 Reference Range: 80 - 120 mg / dl Electronic Signature(s) Signed: 12/12/2015 4:52:57 PM By: [name] Entered By: [name] on 12/12/2015 10:26:25

## 2015-12-13 NOTE — Progress Notes (Signed)
VERONIA, PIOTROWICZ ([id number]) Visit Report for 12/12/2015 Chief Complaint Document Details Patient Name: [name], [name] 12/12/2015 10:15 Date of Service: AM Medical Record [id number] Number: Patient Account Number: [id number] 12-20-1945 (70 y.o. Treating RN: [name] Date of Birth/Sex: Female) Other Clinician: Primary Care Physician: [facility], Hyman Hopes, Nasia Cannan Referring Physician: Glendon Axe Physician/Extender: Weeks in Treatment: 10 Information Obtained from: Patient Chief Complaint Patient presents to the wound care center for a consult due non healing wound 70 year old patient comes with a history of having a ulcer on the left lower extremity for the past 4 weeks. she says she's had swelling of both lower extremities for about a year after she started having prednisone. 02/07/2015 -- her vascular appointments obtained were in the first and third week of June. she is able to go to [place] and we will try and get her some earlier appointments. Other than that nothing else has changed in her management. Electronic Signature(s) Signed: 12/12/2015 12:42:46 PM By: Judene Companion MD Entered By: Judene Companion on 12/12/2015 11:21:52 Williams, JAYD OBERG ([id number]) -------------------------------------------------------------------------------- HPI Details Patient Name: [name], [name] 12/12/2015 10:15 Date of Service: AM Medical Record [id number] Number: Patient Account Number: [id number] 1946-01-15 (70 y.o. Treating RN: [name] Date of Birth/Sex: Female) Other Clinician: Primary Care Physician: Danny Lawless, Lewis Grivas Referring Physician: Glendon Axe Physician/Extender: Weeks in Treatment: 49 History of Present Illness HPI Description: 70 year old patient who is known to have diabetes mellitus type 2, chronic renal insufficiency, coronary artery disease, hypertension, hypercholesterolemia, temporal arteritis and inflammatory arthritiss  also has a history of having a hysterectomy and some orthopedic related surgeries. The ulcer on the left lower extremity started off as a blister and then. Got progressively worse. She does not have any fever or chills and has not had any recent surgical intervention for this. Her last hemoglobin A1c was 10.1 in September 2015. She has been recently put on doxycycline by her PCP. She is now also allergic to doxycycline and was this was changed over to Keflex. 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 She does see a cardiologist and also takes a diuretic. 02/07/2015 her arterial and venous duplex studies to be done have dates been given as the first and third week of June. This is at [facility]. We are going to try and get early appointments at [facility]. other than that nothing has changed in her management. 02/14/2015 -- we have been able to get her an appointment in [facility] on May 20 which is much earlier than her previous ones at [place]. She continues with her prednisone and her sugars are in the range of 150-200. 02/21/2015 We were able to get a vascular lab workup for her today and she is going to be there at 2:00 this afternoon. the swelling of her leg has gone down significantly but she still has some tenderness over the wounds. 02/28/2015 - She has had one of two vascular workups done, and this coming Tuesday has another, at [facility]. She continues to be on steroid medications. She has significant sensitivity in her left lower extremity and has pain suggestive of neuropathic pain and I have asked her to address this with her primary care physician. 03/07/2015 -- The patient saw Dr. 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  Official report is awaited. Addendum: Official reports are now available and on 03/04/2015. She was seen and lower extremity venous duplex exam was done. There was reflux present within the left greater saphenous vein below the knee and also the left small saphenous vein. Arterial duplex showed normal triphasic waveforms throughout the left lower extremity without any significant stenosis. Her ABIs were noncompressible bilaterally but a waveforms were normal and a digital pressures were normal bilaterally consistent with no significant arterial insufficiency. He has recommended endovenous ablation of both the left small saphenous and the left great saphenous vein. This would still be scheduled later. 03/14/2015 -- she has heard back from the vascular office and has surgery scheduled for sometime in July. [name], [name] ([id number]) Her rheumatologist has decreased her prednisone dosage but she still on it. She has also had cataract surgery in her right eye recently this week. 03/20/2015 - No new complaints today. Pain improved. No fever or chills. Tolerating 2 layer compression. 04/14/2015 -- she was doing very well today she went off on vacation and now her edema has increased markedly the ulceration is bigger and her diabetes is not under control. 04/21/2015 -- I spoke to her PCP Dr. Candiss Norse and discussed the management which would include being seen by a general surgeon for debridement and taking multiple punch biopsies which would help in establishing the diagnosis of this is a vasculitis. She is agreeable about this and will set her up for the procedure with Dr. Tamala Julian at [facility]. She was seen by the surgeon Dr. Jamal Collin. 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 She is scheduled to have laser ablation done next week.Pt was sent here for possible biopsy to look for  vasculitis. Feel it would be better to wait after laser ablation is completed- the ulcer may heal fully and biopsy may not be necessary 04/29/2015 -- she had the venous ablation done by Dr. Lucky Cowboy last Friday and we do not have any notes yet. She is doing fine otherwise. 05/06/2015 --Review of her recent vascular intervention shows that she was seen by Dr. Lucky Cowboy on 04/29/2015. The follow-up duplex which was done showed that both the great saphenous vein and the small saphenous vein remained patent with reflux consistent with an unsuccessful ablation. He has rescheduled her for another the endovenous ablation to be done in about 4 weekso time. 05/13/2015 -- he was seen by her surgeon Dr. Jamal Collin who asked her to continue with conservative therapy and he would speak to Dr. Lucky Cowboy about her management. Dr. Lucky Cowboy is going to schedule her surgery in the middle of August for a repeat endovenous ablation. Her pus culture from last week has grown : [place] her noted her sensitivity report but due to her multiple allergies I had tried clindamycin and she developed a rash with this too. She has been prescribed and anti-buttocks in the ER and is has it at home and she will let is know what she is going to be taking. 05/20/2015 -- she has developed a small spot on her right lower extremity but besides that it is not a full fledged ulceration. She did not get to see Dr. Lucky Cowboy last week and hopefully she will see him in the near future. 05/27/2015 -she is still awaiting her appointment  with Dr.Dew and her vascular procedure is not scheduled until August 19. She will be seeing her PCP tomorrow and I have asked her to convey our discussion so that she is aware that debridement has not been done yet. 06/03/2015 -- was seen by her rheumatologist Dr. Dorthula Matas, who has been treating her for temporal arteritis and  in his note has mentioned the possibility of vasculitis or pyoderma gangrenosum. He is lowering her prednisone to 12-1/2 mg for 1 month and then 10 mg per the next month. I will again make an attempt to speak to her PCP Dr. Candiss Norse and her surgeon Dr. 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 06/17/2015 -- Dr.Dew did her vascular procedure last week and a follow-up venous ultrasound shows good resolution of the veins as per the patient's history. He is to see her back in 2 weeks. [name], [name] ([id number]) 07/07/2015. -- the patient has had a heavy growth of Proteus mirabilis and Enterococcus faecalis. These are sensitive to several drugs but the problem is she has allergies to all of these and hence I would like her to see Dr. Ola Spurr for this. She is also due to see Dr. Lucky Cowboy tomorrow and I will discussed the management with him including debridement under anesthesia and possible biopsies. 07/14/2015 -- she has an appointment to see Dr. Ola Spurr tomorrow and did see Dr. Bunnie Domino PA who will discuss my request with him. 07/21/2015 -- saw Dr. Ola Spurr was able to do a test on her and has put her on amoxicillin. She has been tolerating that and has had no problems with allergies to this. 07/28/2015 -- Last Friday I spoke to Dr. Leotis Pain regarding her care and he said that her right leg did not need any surgery and on the left leg was doing pretty good. We did agree that if she undergoes any procedure in the future he would do a couple of punch biopsies of the wound. 08/18/2015 -- her right leg is very tender and there is significant amount of slough. The left leg is looking much better 09/02/2015 -- she is going to have a debridement and punch biopsies of her right lower extremity by Dr. Leotis Pain this coming Thursday. Also seen Dr. Ola Spurr who has continued her on  ciprofloxacin. 09/09/2015 -- on 09/04/2015 Dr. Leotis Pain took her to surgery - Irrigation and excisional debridement of skin, soft tissue, and muscle to about 40 cm2 to the right posterior calf with biopsy. Pathology results are -- DIAGNOSIS: SKIN, RIGHT LOWER EXTREMITY; BIOPSY: SKIN AND SOFT TISSUE WITH ULCERATION, SEE NOTE. - NEGATIVE FOR DYSPLASIA AND MALIGNANCY. Note: There is acute inflammation and ulceration of the epidermis. The dermis shows nonspecific inflammation, neovascularization, and hemosiderin deposition. The differential diagnosis for these findings includes stasis dermatitis, nonspecific dermatitis, and infection. Correlation with clinical findings is required. A PAS fungal stain is obtained and results will be reported in an addendum. cultures were also sent and this grew Pseudomonas aeruginosa, Escherichia coli, Proteus mirabilis, Klebsiella pneumoniae and they were all sensitive to ciprofloxacin which she is on. 09/16/2015 -- he saw Dr. 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 He is going to call the patient in and discuss with her the management possibly with Imuran. I will continue treating her  locally. 09/23/2015 -- she saw Dr. Ola Spurr today who was going to continue the antibiotics for now and stop after this course. She has an appointment to see Dr. Jefm Bryant in about a week's time. Her wound VAC has arrived but she did not bring it with her today. We will try and set her up for changes to the right leg 3 times a week. She is here for her first application of Apligraf to the left lower extremity. 09/30/2015 -- she has seen Dr. 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 because she is ambulatory at home health will not apply a wound VAC and she will have  to come here 3 times a week on Monday Wednesday and Friday. 10/10/2015 -- she is here for a second application of Apligraf to her left lower extremity. 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 Jefm Bryant. Other than that she is doing well 10/31/2015 -- she is here for a third application of Apligraf to her left lower extremity. after reviewing her wound on the right side I noted that it is granulating extremely well and we will use the remnants of the Apligraf on the right leg. SHAUGHNESSY, BRUECK ([id number]) 11/14/2015 -- she was recently admitted to the hospital between January 11 and 11/10/2015 for uncontrolled hypertension, diabetes mellitus, acute kidney injury. during her admission she was supported by wound care and also by antibiotics. Around this time her immunosuppression was stopped by Dr. Nunzio Cory. She is feeling much better now. 11/21/2015 -- she has had her fourth application of Apligraf today and it has been shared between the left and the right leg 11/24/2015 -- was here for a wound VAC change but it was noticed that she had profuse bleeding from the lobe medial part of her right lower extremity and I was called in to evaluate her. 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 She has been doing fine since then Electronic Signature(s) Signed: 12/12/2015 12:42:46 PM By: Judene Companion MD Entered By: Judene Companion on 12/12/2015 11:23:36 [name], [name] ([id number]) -------------------------------------------------------------------------------- Physical Exam Details Patient Name: PHIONA, OLLIVER D. 12/12/2015 10:15 Date of Service: AM Medical Record [id number] Number: Patient Account Number: [id number] [birth year] (70 y.o. Treating RN: [name] Date of Birth/Sex: Female) Other Clinician: Primary Care Physician: Danny Lawless, Jhovani Griswold Referring Physician: Glendon Axe Physician/Extender: Weeks in Treatment: 45 Electronic Signature(s) Signed: 12/12/2015 12:42:46 PM By: Judene Companion MD Entered By: Judene Companion on 12/12/2015 11:23:44 [name], [name] ([id number]) -------------------------------------------------------------------------------- Physician Orders Details Patient Name: [name], [name] 12/12/2015 10:15 Date of Service: AM Medical Record [id number] Number: Patient Account Number: [id number] 16-May-1946 (70 y.o. Treating RN: [name] Date of Birth/Sex: Female) Other Clinician: Primary Care Physician: Danny Lawless, Angelik Walls Referring Physician: Glendon Axe Physician/Extender: Weeks in Treatment: 53 Verbal / Phone Orders: Yes Clinician: [name] Read Back and Verified: Yes Diagnosis Coding Wound Cleansing Wound #2 Left,Distal Lower Leg o Cleanse wound with mild soap and water o May shower with protection. Wound #5 Right,Posterior Lower Leg o Cleanse wound with mild soap and water o May shower with protection. Wound #6 Right,Medial Lower Leg o Cleanse wound with mild soap and water o May shower with protection. Skin Barriers/Peri-Wound Care Wound #2 Left,Distal Lower Leg o Skin Prep Wound #5 Right,Posterior  Lower Leg o Skin Prep Wound #6 Right,Medial Lower Leg o Skin Prep Primary Wound Dressing Wound #2 Left,Distal Lower Leg o Mepitel One - over apligraf Wound #5 Right,Posterior Lower Leg o Drawtex o Mepitel One - over apligraf Wound #6 Right,Medial Lower Leg o Santyl Ointment Secondary Dressing Douthitt, Gayleen D. ([id number]) Wound #2 Left,Distal Lower Leg o Dry Gauze o Boardered Foam Dressing Wound #5 Right,Posterior Lower Leg o Dry Gauze o Boardered Foam Dressing Wound #6 Right,Medial Lower Leg o Dry Gauze o Boardered Foam Dressing Dressing Change Frequency Wound #2 Left,Distal Lower Leg o  Change dressing every week - and as needed Wound #5 Right,Posterior Lower Leg o Change dressing every week - and as needed Wound #6 Right,Medial Lower Leg o Change dressing every day. Follow-up Appointments Wound #2 Left,Distal Lower Leg o Return Appointment in 1 week. o Other: - nurse visit Monday and Wednesday Wound #5 Right,Posterior Lower Leg o Return Appointment in 1 week. o Other: - nurse visit Monday and Wednesday Wound #6 Right,Medial Lower Leg o Return Appointment in 1 week. o Other: - nurse visit Monday and Wednesday Edema Control Wound #2 Left,Distal Lower Leg o Patient to wear own compression stockings Wound #5 Right,Posterior Lower Leg o Patient to wear own compression stockings Wound #6 Right,Medial Lower Leg o Patient to wear own compression stockings Electronic Signature(s) PANAGIOTA, FAUL ([id number]) Signed: 12/12/2015 12:42:46 PM By: Judene Companion MD Signed: 12/12/2015 4:52:57 PM By: [name] Entered By: [name] on 12/12/2015 10:45:52 Much, [name] ([id number]) -------------------------------------------------------------------------------- Problem List Details Patient Name: [name], [name] 12/12/2015 10:15 Date of Service: AM Medical Record [id number] Number: Patient Account Number: [id number] 08/23/1946 (70 y.o. 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 01/31/2015 Yes exposed E66.01 Morbid (severe) obesity due to excess calories 01/31/2015 Yes I89.0 Lymphedema, not elsewhere classified 01/31/2015 Yes L03.116 Cellulitis of left lower limb 07/07/2015  Yes L97.212 Non-pressure chronic ulcer of right calf with fat layer 11/24/2015 Yes exposed I87.311 Chronic venous hypertension (idiopathic) with ulcer of 11/24/2015 Yes right lower extremity Inactive Problems Resolved Problems ICD-10 Code Description Active Date Resolved Date I83.222 Varicose veins of left lower extremity with both ulcer of 03/07/2015 03/07/2015 calf and inflammation ANNLEIGH, ARNSTEIN ([id number]) I83.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 with both ulcer of 03/07/2015 03/07/2015 ankle and inflammation Electronic Signature(s) Signed: 12/12/2015 12:42:46 PM By: Judene Companion MD Entered By: Judene Companion on 12/12/2015 11:21:41 [place]worth, Tia Masker ([id number]) -------------------------------------------------------------------------------- Progress Note Details Patient Name: [name] 12/12/2015 10:15 Date of Service: AM Medical Record [id number] Number: Patient Account Number: [id number] 1946/10/04 (70 y.o. Treating RN: [name] Date of Birth/Sex: Female) Other Clinician: Primary Care Physician: [facility], Hyman Hopes, Bailyn Spackman Referring Physician: Glendon Axe Physician/Extender: Weeks in Treatment: 57 Subjective Chief Complaint Information obtained from Patient Patient presents to the wound care center for a consult due non healing wound 70 year old patient comes with a history of having a ulcer on the left lower extremity for the past 4 weeks. she says she's had swelling of both lower extremities for about a year after she started having prednisone. 02/07/2015 -- her vascular appointments obtained were in the first and third week of June. she is able to go to  [place] and we will try and get her some earlier appointments. Other than that nothing else has changed in her management. History of Present Illness (HPI) 71 year old patient who is known to have diabetes mellitus type 2, chronic renal insufficiency, coronary artery disease, hypertension,  hypercholesterolemia, temporal arteritis and inflammatory arthritiss also has a history of having a hysterectomy and some orthopedic related surgeries. The ulcer on the left lower extremity started off as a blister and then. Got progressively worse. She does not have any fever or chills and has not had any recent surgical intervention for this. Her last hemoglobin A1c was 10.1 in September 2015. She has been recently put on doxycycline by her PCP. She is now also allergic to doxycycline and was this was changed over to Keflex. 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 She does see a cardiologist and also takes a diuretic. 02/07/2015 her arterial and venous duplex studies to be done have dates been given as the first and third week of June. This is at [facility]. We are going to try and get early appointments at [facility]. other than that nothing has changed in her management. 02/14/2015 -- we have been able to get her an appointment in [facility] on May 20 which is much earlier than her previous ones at [place]. She continues with her prednisone and her sugars are in the range of 150-200. 02/21/2015 We were able to get a vascular lab workup for her today and she is going to be there at 2:00 this afternoon. the swelling of her leg has gone down significantly but she still has some tenderness over the wounds. 02/28/2015 - She has had one of two vascular workups done, and this coming Tuesday has another, at [facility]. She continues to be on steroid medications. She has significant sensitivity in her left lower extremity and has pain suggestive of neuropathic pain and I have asked her to address this with her primary care physician. 03/07/2015 -- The patient saw Dr. Lucky Cowboy for a consultation and he has had her arteries are okay but she has 2 incompetent veins  on the left lower extremity and he is going to set her up for surgery. Official report is [name], [name] ([id number]) awaited. Addendum: Official reports are now available and on 03/04/2015. She was seen and lower extremity venous duplex exam was done. There was reflux present within the left greater saphenous vein below the knee and also the left small saphenous vein. Arterial duplex showed normal triphasic waveforms throughout the left lower extremity without any significant stenosis. Her ABIs were noncompressible bilaterally but a waveforms were normal and a digital pressures were normal bilaterally consistent with no significant arterial insufficiency. He has recommended endovenous ablation of both the left small saphenous and the left great saphenous vein. This would still be scheduled later. 03/14/2015 -- she has heard back from the vascular office and has surgery scheduled for sometime in July. Her rheumatologist has decreased her prednisone dosage but she still on it. She has also had cataract surgery in her right eye recently this week. 03/20/2015 - No new complaints today. Pain improved. No fever or chills. Tolerating 2 layer compression. 04/14/2015 -- she was doing very well today she went off on vacation and now her edema has increased markedly the ulceration is bigger and her diabetes is not under control. 04/21/2015 --  I spoke to her PCP Dr. Candiss Norse and discussed the management which would include being seen by a general surgeon for debridement and taking multiple punch biopsies which would help in establishing the diagnosis of this is a vasculitis. She is agreeable about this and will set her up for the procedure with Dr. Tamala Julian at [facility]. She was seen by the surgeon Dr. Jamal Collin. 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 She is scheduled to have laser  ablation done next week.Pt was sent here for possible biopsy to look for vasculitis. Feel it would be better to wait after laser ablation is completed- the ulcer may heal fully and biopsy may not be necessary 04/29/2015 -- she had the venous ablation done by Dr. Lucky Cowboy last Friday and we do not have any notes yet. She is doing fine otherwise. 05/06/2015 --Review of her recent vascular intervention shows that she was seen by Dr. Lucky Cowboy on 04/29/2015. The follow-up duplex which was done showed that both the great saphenous vein and the small saphenous vein remained patent with reflux consistent with an unsuccessful ablation. He has rescheduled her for another the endovenous ablation to be done in about 4 weeks time. 05/13/2015 -- he was seen by her surgeon Dr. Jamal Collin who asked her to continue with conservative therapy and he would speak to Dr. Lucky Cowboy about her management. Dr. Lucky Cowboy is going to schedule her surgery in the middle of August for a repeat endovenous ablation. Her pus culture from last week has grown : [place] her noted her sensitivity report but due to her multiple allergies I had tried clindamycin and she developed a rash with this too. She has been prescribed and anti-buttocks in the ER and is has it at home and she will let is know what she is going to be taking. 05/20/2015 -- she has developed a small spot on her right lower extremity but besides that it is not a full fledged ulceration. She did not get to see Dr. Lucky Cowboy last week and hopefully she will see him in the near future. 05/27/2015 -she is still awaiting her appointment with Dr.Dew and her vascular procedure is not scheduled until August 19. She will be seeing her PCP tomorrow and I have asked her to convey our discussion so that she is aware that debridement has not been done yet. [name], [name] ([id number]) 06/03/2015 -- was seen by  her rheumatologist Dr. 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 He is lowering her prednisone to 12-1/2 mg for 1 month and then 10 mg per the next month. I will again make an attempt to speak to her PCP Dr. Candiss Norse and her surgeon Dr. 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 06/17/2015 -- Dr.Dew did her vascular procedure last week and a follow-up venous ultrasound shows good resolution of the veins as per the patient's history. He is to see her back in 2 weeks. 07/07/2015. -- the patient has had a heavy growth of Proteus mirabilis and Enterococcus faecalis. These are sensitive to several drugs but the problem is she has allergies to all of these and hence I would like her to see Dr. Ola Spurr  for this. She is also due to see Dr. Lucky Cowboy tomorrow and I will discussed the management with him including debridement under anesthesia and possible biopsies. 07/14/2015 -- she has an appointment to see Dr. Ola Spurr tomorrow and did see Dr. Bunnie Domino PA who will discuss my request with him. 07/21/2015 -- saw Dr. Ola Spurr was able to do a test on her and has put her on amoxicillin. She has been tolerating that and has had no problems with allergies to this. 07/28/2015 -- Last Friday I spoke to Dr. Leotis Pain regarding her care and he said that her right leg did not need any surgery and on the left leg was doing pretty good. We did agree that if she undergoes any procedure in the future he would do a couple of punch biopsies of the wound. 08/18/2015 -- her right leg is very tender and there is significant amount of slough. The left leg is looking much better 09/02/2015 -- she is going to have a debridement and punch biopsies of her right lower extremity by Dr. Leotis Pain this coming Thursday.  Also seen Dr. Ola Spurr who has continued her on ciprofloxacin. 09/09/2015 -- on 09/04/2015 Dr. Leotis Pain took her to surgery - Irrigation and excisional debridement of skin, soft tissue, and muscle to about 40 cm2 to the right posterior calf with biopsy. Pathology results are -- DIAGNOSIS: SKIN, RIGHT LOWER EXTREMITY; BIOPSY: SKIN AND SOFT TISSUE WITH ULCERATION, SEE NOTE. - NEGATIVE FOR DYSPLASIA AND MALIGNANCY. Note: There is acute inflammation and ulceration of the epidermis. The dermis shows nonspecific inflammation, neovascularization, and hemosiderin deposition. The differential diagnosis for these findings includes stasis dermatitis, nonspecific dermatitis, and infection. Correlation with clinical findings is required. A PAS fungal stain is obtained and results will be reported in an addendum. cultures were also sent and this grew Pseudomonas aeruginosa, Escherichia coli, Proteus mirabilis, Klebsiella pneumoniae and they were all sensitive to ciprofloxacin which she is on. 09/16/2015 -- he saw Dr. 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 He is going to call the patient in and discuss with her the management possibly with Imuran. I will continue treating her locally. 09/23/2015 -- she saw Dr. Ola Spurr today who was going to continue the antibiotics for now and stop after this course. She has an appointment to see Dr. Jefm Bryant in about a week's time. Her wound VAC has arrived but she did not bring it with her today. We will try and set her up for changes to the right leg 3 times a week. She is here for her first application of Apligraf to the left lower extremity. [name], WINKLE ([id number]) 09/30/2015 -- she has seen Dr. 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 because  she is ambulatory at home health will not apply a wound VAC and she will have to come here 3 times a week on Monday Wednesday and Friday. 10/10/2015 -- she is here for a second application of Apligraf to her left lower extremity. 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 Jefm Bryant. Other than that she is doing well 10/31/2015 -- she is here for a third application of Apligraf to her left lower extremity. after reviewing her wound on the right side I noted that it is granulating extremely well  and we will use the remnants of the Apligraf on the right leg. 11/14/2015 -- she was recently admitted to the hospital between January 11 and 11/10/2015 for uncontrolled hypertension, diabetes mellitus, acute kidney injury. during her admission she was supported by wound care and also by antibiotics. Around this time her immunosuppression was stopped by Dr. Nunzio Cory. She is feeling much better now. 11/21/2015 -- she has had her fourth application of Apligraf today and it has been shared between the left and the right leg 11/24/2015 -- was here for a wound VAC change but it was noticed that she had profuse bleeding from the lobe medial part of her right lower extremity and I was called in to evaluate her. 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 She has been doing fine since then Objective Constitutional Vitals Time Taken: 10:24 AM, Height: 65 in, Weight: 248 lbs, BMI: 41.3, Temperature: 98.1 F, Pulse: 64 bpm, Respiratory Rate: 18 breaths/min, Blood Pressure: 157/61 mmHg. Integumentary (Hair, Skin) Wound #2 status is Open. Original cause of wound was Blister. The wound is located on the Left,Distal Lower Leg. The wound measures 1cm length x 0.7cm width x 0.1cm depth; 0.55cm^2 area and 0.055cm^3 volume. Wound #5 status is Open. Original cause of  wound was Gradually Appeared. The wound is located on the Right,Posterior Lower Leg. The wound measures 5.4cm length x 8.3cm width x 0.2cm depth; 35.202cm^2 area and 7.04cm^3 volume. Wound #6 status is Open. Original cause of wound was Gradually Appeared. The wound is located on the Right,Medial Lower Leg. The wound measures 6.2cm length x 4.2cm width x 0.2cm depth; 20.452cm^2 area and 4.09cm^3 volume. The wound is limited to skin breakdown. There is no tunneling or undermining noted. There is a medium amount of purulent drainage noted. The wound margin is distinct with the outline attached to the wound base. There is no granulation within the wound bed. There is a large (67-100%) amount of necrotic tissue within the wound bed including Eschar and Adherent Slough. The periwound skin appearance exhibited: Moist. The periwound skin appearance did not exhibit: Callus, Crepitus, Excoriation, Goleman, Darren D. ([id number]) Fluctuance, Friable, Induration, Localized Edema, Rash, Scarring, Dry/Scaly, Maceration, Atrophie Blanche, Cyanosis, Ecchymosis, Hemosiderin Staining, Mottled, Pallor, Rubor, Erythema. Periwound temperature was noted as No Abnormality. The periwound has tenderness on palpation. 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 Has large necrotic ulcer right leg but she refused debridement. Compression dressing applied RTC 1 week. Plan Wound Cleansing: Wound #2 Left,Distal Lower Leg: Cleanse wound with mild soap and water May shower  with protection. Wound #5 Right,Posterior Lower Leg: Cleanse wound with mild soap and water May shower with protection. Wound #6 Right,Medial Lower Leg: Cleanse wound with mild soap and water May shower with protection. Skin Barriers/Peri-Wound Care: Wound #2 Left,Distal Lower Leg: Skin Prep Wound #5 Right,Posterior Lower Leg: Skin Prep Wound #6 Right,Medial Lower Leg: Skin Prep Primary Wound Dressing: Wound #2 Left,Distal Lower Leg: Embry, Jaritza D. ([id number]) Mepitel One - over apligraf Wound #5 Right,Posterior Lower Leg: Drawtex  Mepitel One - over apligraf Wound #6 Right,Medial Lower Leg: Santyl Ointment Secondary Dressing: Wound #2 Left,Distal Lower Leg: Dry Gauze Boardered Foam Dressing Wound #5 Right,Posterior Lower Leg: Dry Gauze Boardered Foam Dressing Wound #6 Right,Medial Lower Leg: Dry Gauze Boardered Foam Dressing Dressing Change Frequency: Wound #2 Left,Distal Lower Leg: Change dressing every week - and as needed Wound #5 Right,Posterior Lower Leg: Change dressing every week - and as needed Wound #6 Right,Medial Lower Leg: Change dressing every day. Follow-up Appointments: Wound #2 Left,Distal Lower Leg: Return Appointment in 1 week. Other: - nurse visit Monday and Wednesday Wound #5 Right,Posterior Lower Leg: Return Appointment in 1 week. Other: - nurse visit Monday and Wednesday Wound #6 Right,Medial Lower Leg: Return Appointment in 1 week. Other: - nurse visit Monday and Wednesday Edema Control: Wound #2 Left,Distal Lower Leg: Patient to wear own compression stockings Wound #5 Right,Posterior Lower Leg: Patient to wear own compression stockings Wound #6 Right,Medial Lower Leg: Patient to wear own compression stockings Follow-Up Appointments: A Patient Clinical Summary of Care was provided to [facility] SKKY, REDDINGER ([id number]) Electronic Signature(s) Signed: 12/12/2015 12:39:33 PM By: Judene Companion MD Entered By: Judene Companion on 12/12/2015  12:39:33 Sisney, Harman D. ([id number]) -------------------------------------------------------------------------------- SuperBill Details Patient Name: [name] Date of Service: 12/12/2015 Medical Record Number: [id number] Patient Account Number: [id number] Date of Birth/Sex: May 21, 1946 (70 y.o. Female) Treating RN: [name] Primary Care Physician: Glendon Axe Other Clinician: Referring Physician: Glendon Axe Treating Physician/Extender: Benjaman Pott in Treatment: 45 Diagnosis Coding ICD-10 Codes Code Description E11.622 Type 2 diabetes mellitus with other skin ulcer L97.322 Non-pressure chronic ulcer of left ankle with fat layer exposed E66.01 Morbid (severe) obesity due to excess calories I89.0 Lymphedema, not elsewhere classified L03.116 Cellulitis of left lower limb L97.212 Non-pressure chronic ulcer of right calf with fat layer exposed I87.311 Chronic venous hypertension (idiopathic) with ulcer of right lower extremity Facility Procedures CPT4 Code: [id number] Description: 99214 - WOUND CARE VISIT-LEV 4 EST PT Modifier: Quantity: 1 Physician Procedures CPT4 Code: [id number] Description: 99213 - WC PHYS LEVEL 3 - EST PT ICD-10 Description Diagnosis L97.322 Non-pressure chronic ulcer of left ankle with fat L97.212 Non-pressure chronic ulcer of right calf with fat Modifier: layer exposed layer exposed Quantity: 1 Electronic Signature(s) Signed: 12/12/2015 4:30:15 PM By: [name] Previous Signature: 12/12/2015 12:41:40 PM Version By: Judene Companion MD Entered By: [name] on 12/12/2015 16:30:15

## 2015-12-19 ENCOUNTER — Encounter: Payer: Commercial Managed Care - HMO | Admitting: Surgery

## 2015-12-19 DIAGNOSIS — E11622 Type 2 diabetes mellitus with other skin ulcer: Secondary | ICD-10-CM | POA: Diagnosis not present

## 2015-12-20 NOTE — Progress Notes (Signed)
[name] ([id number]) Visit Report for 12/19/2015 Arrival Information Details Patient Name: [name], [name] Date of Service: 12/19/2015 2:15 PM Medical Record Number: [id number] Patient Account Number: [id number] Date of Birth/Sex: 09-16-1946 (70 y.o. Female) Treating RN: [name] Primary Care Physician: [name] Other Clinician: Referring Physician: Glendon [name] Treating Physician/Extender: [name] in Treatment: 55 Visit Information History Since Last Visit Added or deleted any medications: No Patient Arrived: Cane Any new allergies or adverse reactions: No Arrival Time: 14:28 Had a fall or experienced change in No Accompanied By: self activities of daily living that may affect Transfer Assistance: None risk of falls: Patient Identification Verified: Yes Signs or symptoms of abuse/neglect since last No Secondary Verification Process Yes visito Completed: Hospitalized since last visit: No Patient Requires Transmission- No Pain Present Now: No Based Precautions: Patient Has Alerts: Yes Patient Alerts: Patient on Blood Thinner Electronic Signature(s) Signed: 12/19/2015 5:44:12 PM By: [name] Entered By: [name] on 12/19/2015 14:29:01 Morad, [name] ([id number]) -------------------------------------------------------------------------------- Encounter Discharge Information Details Patient Name: [name], [name] Date of Service: 12/19/2015 2:15 PM Medical Record Number: [id number] Patient Account Number: [id number] Date of Birth/Sex: 1946/09/14 (70 y.o. Female) Treating RN: [name] Primary Care Physician: [name] Other Clinician: Referring Physician: Glendon [name] Treating Physician/Extender: [name] in Treatment: 33 Encounter Discharge Information Items Discharge Pain Level: 0 Discharge Condition: Stable Ambulatory Status: Cane Discharge Destination: Home Transportation: Private Auto Accompanied  By: self Schedule Follow-up Appointment: Yes Medication Reconciliation completed No and provided to Patient/Care [name]: Provided on Clinical Summary of Care: 12/19/2015 Form Type Recipient Paper Patient LM Electronic Signature(s) Signed: 12/19/2015 5:11:34 PM By: [name] Previous Signature: 12/19/2015 3:29:57 PM Version By: [name] Entered By: [name] on 12/19/2015 17:11:34 [name], [name] ([id number]) -------------------------------------------------------------------------------- Lower Extremity Assessment Details Patient Name: [name] Date of Service: 12/19/2015 2:15 PM Medical Record Number: [id number] Patient Account Number: [id number] Date of Birth/Sex: June 07, 1946 (70 y.o. Female) Treating RN: [name] Primary Care Physician: [name] Other Clinician: Referring Physician: Glendon [name] Treating Physician/Extender: [name] in Treatment: 46 Edema Assessment Assessed: [Left: No] [Right: No] Edema: [Left: Yes] [Right: Yes] Vascular Assessment Pulses: Posterior Tibial Dorsalis Pedis Palpable: [Left:Yes] [Right:Yes] Extremity colors, hair growth, and conditions: Extremity Color: [Left:Hyperpigmented] [Right:Red] Hair Growth on Extremity: [Left:No] [Right:No] Temperature of Extremity: [Left:Warm] [Right:Warm] Capillary Refill: [Left:< 3 seconds] [Right:< 3 seconds] Electronic Signature(s) Signed: 12/19/2015 2:50:21 PM By: [name] Entered By: [name] on 12/19/2015 14:50:20 [name], [name] ([id number]) -------------------------------------------------------------------------------- Multi Wound Chart Details Patient Name: [name] Date of Service: 12/19/2015 2:15 PM Medical Record Number: [id number] Patient Account Number: [id number] Date of Birth/Sex: 10-07-1946 (70 y.o. Female) Treating RN: [name] Primary Care Physician: [name] Other Clinician: Referring Physician: Glendon [name] Treating Physician/Extender: [name] in Treatment: 46 Vital Signs Height(in): 65 Pulse(bpm): 82 Weight(lbs): 248 Blood Pressure 150/57 (mmHg): Body Mass Index(BMI): 41 Temperature(F): 97.5 Respiratory Rate 18 (breaths/min): Photos: [2:No Photos] [5:No Photos] [6:No Photos] Wound Location: [2:Left Lower Leg - Distal] [5:Right Lower Leg - Posterior] [6:Right Lower Leg - Medial] Wounding Event: [2:Blister] [5:Gradually Appeared] [6:Gradually Appeared] Primary Etiology: [2:Venous Leg Ulcer] [5:Venous Leg Ulcer] [6:Diabetic Wound/Ulcer of the Lower Extremity] Comorbid History: [2:Cataracts, Asthma, Coronary Artery Disease, Coronary Artery Disease, Coronary Artery Disease, Hypertension, Type II Diabetes, Osteoarthritis, Diabetes, Osteoarthritis, Diabetes, Osteoarthritis, Neuropathy] [5:Cataracts, Asthma,  Hypertension, Type II Neuropathy] [6:Cataracts, Asthma, Hypertension, Type II Neuropathy] Date Acquired: [2:12/30/2014] [5:04/29/2015] [6:10/10/2015] Weeks of Treatment: [2:46] [5:30] [6:10] Wound  Status: [2:Open] [5:Open] [6:Open] Clustered Wound: [2:Yes] [5:No] [6:No] Measurements L x W x [name] 0.5x0.5x0.1 [5:5.2x7.3x0.1] [6:6.1x4.5x0.2] (cm) Area (cm) : [2:0.196] [5:29.814] [6:21.559] Volume (cm) : [2:0.02] [5:2.981] [6:4.312] % Reduction in Area: [2:72.30%] [5:-15758.50%] [6:-523.80%] % Reduction in Volume: 71.80% [5:-15589.50%] [6:-1146.20%] Classification: [2:Full Thickness Without Exposed Support Structures] [5:Full Thickness Without Exposed Support Structures] [6:Grade 1] HBO Classification: [2:Grade 1] [5:Grade 1] [6:N/A] Exudate Amount: [2:Small] [5:Large] [6:Medium] Exudate Type: [2:Serous] [5:Serous] [6:Purulent] Exudate Color: [2:amber] [5:amber] [6:yellow, brown, green] Wound Margin: [2:Flat and Intact] [5:Flat and Intact] [6:Distinct, outline attached] Granulation Amount: [2:Large (67-100%)] [5:Large (67-100%)] [6:Small (1-33%)] Granulation Quality:  Red Red, Pink, Hyper- Red granulation Necrotic Amount: None Present (0%) Small (1-33%) Large (67-100%) Necrotic Tissue: N/A Adherent Slough Eschar, Adherent Slough Exposed Structures: Fascia: No Fascia: No Fascia: No Fat: No Fat: No Fat: No Tendon: No Tendon: No Tendon: No Muscle: No Muscle: No Muscle: No Joint: No Joint: No Joint: No Bone: No Bone: No Bone: No Limited to Skin Limited to Skin Limited to Skin Breakdown Breakdown Breakdown Epithelialization: None Small (1-33%) None Periwound Skin Texture: Edema: No Edema: No Edema: No Excoriation: No Excoriation: No Excoriation: No Induration: No Induration: No Induration: No Callus: No Callus: No Callus: No Crepitus: No Crepitus: No Crepitus: No Fluctuance: No Fluctuance: No Fluctuance: No Friable: No Friable: No Friable: No Rash: No Rash: No Rash: No Scarring: No Scarring: No Scarring: No Periwound Skin Moist: Yes Moist: Yes Moist: Yes Moisture: Maceration: No Maceration: No Maceration: No Dry/Scaly: No Dry/Scaly: No Dry/Scaly: No Periwound Skin Color: Atrophie Blanche: No Atrophie Blanche: No Atrophie Blanche: No Cyanosis: No Cyanosis: No Cyanosis: No Ecchymosis: No Ecchymosis: No Ecchymosis: No Erythema: No Erythema: No Erythema: No Hemosiderin Staining: No Hemosiderin Staining: No Hemosiderin Staining: No Mottled: No Mottled: No Mottled: No Pallor: No Pallor: No Pallor: No Rubor: No Rubor: No Rubor: No Temperature: N/A N/A No Abnormality Tenderness on Yes Yes Yes Palpation: Wound Preparation: Ulcer Cleansing: Ulcer Cleansing: Ulcer Cleansing: Rinsed/Irrigated with Rinsed/Irrigated with Rinsed/Irrigated with Saline Saline Saline Topical Anesthetic Topical Anesthetic Topical Anesthetic Applied: Other: lidocaine Applied: Other: lidocaine Applied: Other: lidocaine 4% 4% 4% Treatment Notes Electronic Signature(s) Signed: 12/19/2015 2:50:47 PM By: [name] Entered By:  [name] on 12/19/2015 14:50:46 [name], [name] ([id number]) [place], [place]. ([id number]) -------------------------------------------------------------------------------- [place] Details Patient Name: DENA, ESSNER [name] Date of Service: 12/19/2015 2:15 PM Medical Record Number: [id number] Patient Account Number: [id number] Date of Birth/Sex: Mar 27, 1946 (70 y.o. Female) Treating RN: [name] Primary Care Physician: [name] Other Clinician: Referring Physician: Glendon [name] Treating Physician/Extender: [name] in Treatment: 52 Active Inactive Orientation to the Wound Care Program Nursing Diagnoses: Knowledge deficit related to the wound healing center program Goals: Patient/caregiver will verbalize understanding of the [facility] Program Date Initiated: 01/31/2015 Goal Status: Active Interventions: Provide education on orientation to the wound center Notes: Venous Leg Ulcer Nursing Diagnoses: Potential for venous Insuffiency (use before diagnosis confirmed) Goals: Non-invasive venous studies are completed as ordered Date Initiated: 01/31/2015 Goal Status: Active Patient/caregiver will verbalize understanding of disease process and disease management Date Initiated: 01/31/2015 Goal Status: Active Interventions: Assess peripheral edema status every visit. Notes: Wound/Skin Impairment Nursing Diagnoses: Impaired tissue integrity Knowledge deficit related to smoking impact on wound healing [place], [name] ([id number]) Goals: Patient/caregiver will verbalize understanding of skin care regimen Date Initiated: 01/31/2015 Goal Status: Active Ulcer/skin breakdown will heal within 14 weeks Date Initiated: 01/31/2015 Goal Status: Active Interventions: Assess ulceration(s) every visit Notes: Electronic Signature(s) Signed:  12/19/2015 2:50:37 PM By: [name] Entered By: [name] on 12/19/2015 14:50:37 [name], [name] Kitchen  ([id number]) -------------------------------------------------------------------------------- Patient/Caregiver Education Details Patient Name: [name] Date of Service: 12/19/2015 2:15 PM Medical Record Number: [id number] Patient Account Number: [id number] Date of Birth/Gender: [birth year] (70 y.o. Female) Treating RN: [name] Primary Care Physician: [name] Other Clinician: Referring Physician: Glendon [name] Treating Physician/Extender: [name] in Treatment: 65 Education Assessment Education Provided To: Patient Education Topics Provided Wound/Skin Impairment: Handouts: Other: go to ED if leg gets worse Methods: Explain/Verbal Responses: State content correctly Electronic Signature(s) Signed: 12/19/2015 5:11:56 PM By: [name] Entered By: [name] on 12/19/2015 17:11:56 [name], [name] ([id number]) -------------------------------------------------------------------------------- Wound Assessment Details Patient Name: [name] Date of Service: 12/19/2015 2:15 PM Medical Record Number: [id number] Patient Account Number: [id number] Date of Birth/Sex: [birth year] (70 y.o. Female) Treating RN: [name] Primary Care Physician: [name] Other Clinician: Referring Physician: Glendon [name] Treating Physician/Extender: [name] in Treatment: 80 Wound Status Wound Number: 2 Primary Venous Leg Ulcer Etiology: Wound Location: Left Lower Leg - Distal Wound Open Wounding Event: Blister Status: Date Acquired: 12/30/2014 Comorbid Cataracts, Asthma, Coronary Artery Weeks Of Treatment: 46 History: Disease, Hypertension, Type II Clustered Wound: Yes Diabetes, Osteoarthritis, Neuropathy Photos Photo Uploaded By: [name] on 12/19/2015 17:21:55 Wound Measurements Length: (cm) 0.5 Width: (cm) 0.5 Depth: (cm) 0.1 Area: (cm) 0.196 Volume: (cm) 0.02 % Reduction in Area: 72.3% % Reduction in Volume:  71.8% Epithelialization: None Tunneling: No Undermining: No Wound Description Full Thickness Without Classification: Exposed Support Structures Diabetic Severity Grade 1 (Wagner): Wound Margin: Flat and Intact Exudate Amount: Small Exudate Type: Serous Exudate Color: amber Foul Odor After Cleansing: No Wound Bed Granulation Amount: Large (67-100%) Exposed Structure [name], [name] ([id number]) Granulation Quality: Red Fascia Exposed: No Necrotic Amount: None Present (0%) Fat Layer Exposed: No Tendon Exposed: No Muscle Exposed: No Joint Exposed: No Bone Exposed: No Limited to Skin Breakdown Periwound Skin Texture Texture Color No Abnormalities Noted: No No Abnormalities Noted: No Callus: No Atrophie Blanche: No Crepitus: No Cyanosis: No Excoriation: No Ecchymosis: No Fluctuance: No Erythema: No Friable: No Hemosiderin Staining: No Induration: No Mottled: No Localized Edema: No Pallor: No Rash: No Rubor: No Scarring: No Temperature / Pain Moisture Tenderness on Palpation: Yes No Abnormalities Noted: No Dry / Scaly: No Maceration: No Moist: Yes Wound Preparation Ulcer Cleansing: Rinsed/Irrigated with Saline Topical Anesthetic Applied: Other: lidocaine 4%, Treatment Notes Wound #2 (Left, Distal Lower Leg) 1. Cleansed with: Clean wound with Normal Saline 3. Peri-wound Care: Skin Prep 5. Secondary Dressing Applied Bordered Foam Dressing Electronic Signature(s) Signed: 12/19/2015 5:44:12 PM By: [name] Entered By: [name] on 12/19/2015 14:48:12 [name], [name] ([id number]) -------------------------------------------------------------------------------- Wound Assessment Details Patient Name: [name], [name] Date of Service: 12/19/2015 2:15 PM Medical Record Number: [id number] Patient Account Number: [id number] Date of Birth/Sex: [birth year] (70 y.o. Female) Treating RN: [name] Primary Care Physician: [name] Other  Clinician: Referring Physician: Glendon [name] Treating Physician/Extender: [name] in Treatment: 46 Wound Status Wound Number: 5 Primary Venous Leg Ulcer Etiology: Wound Location: Right Lower Leg - Posterior Wound Open Wounding Event: Gradually Appeared Status: Date Acquired: 04/29/2015 Comorbid Cataracts, Asthma, Coronary Artery Weeks Of Treatment: 30 History: Disease, Hypertension, Type II Clustered Wound: No Diabetes, Osteoarthritis, Neuropathy Photos Photo Uploaded By: [name] on 12/19/2015 17:21:55 Wound Measurements Length: (cm) 5.2 % Reduction in Width: (cm) 7.3 % Reduction in Depth: (cm) 0.1 Epithelializati Area: (cm) 29.814 Tunneling: Volume: (cm)  2.981 Undermining: Area: -15758.5% Volume: -15589.5% on: Small (1-33%) No No Wound Description Full Thickness Without Classification: Exposed Support Structures Diabetic Severity Grade 1 (Wagner): Wound Margin: Flat and Intact Exudate Amount: Large Exudate Type: Serous Exudate Color: amber Foul Odor After Cleansing: No Wound Bed Granulation Amount: Large (67-100%) Exposed Structure [name], [name] ([id number]) Granulation Quality: Red, Pink, Hyper-granulation Fascia Exposed: No Necrotic Amount: Small (1-33%) Fat Layer Exposed: No Necrotic Quality: Adherent Slough Tendon Exposed: No Muscle Exposed: No Joint Exposed: No Bone Exposed: No Limited to Skin Breakdown Periwound Skin Texture Texture Color No Abnormalities Noted: No No Abnormalities Noted: No Callus: No Atrophie Blanche: No Crepitus: No Cyanosis: No Excoriation: No Ecchymosis: No Fluctuance: No Erythema: No Friable: No Hemosiderin Staining: No Induration: No Mottled: No Localized Edema: No Pallor: No Rash: No Rubor: No Scarring: No Temperature / Pain Moisture Tenderness on Palpation: Yes No Abnormalities Noted: No Dry / Scaly: No Maceration: No Moist: Yes Wound Preparation Ulcer Cleansing:  Rinsed/Irrigated with Saline Topical Anesthetic Applied: Other: lidocaine 4%, Treatment Notes Wound #5 (Right, Posterior Lower Leg) 1. Cleansed with: Clean wound with Normal Saline 2. Anesthetic Topical Lidocaine 4% cream to wound bed prior to debridement 4. Dressing Applied: Hydrafera Blue 5. Secondary Dressing Applied ABD Pad 7. Secured with 2 Layer Lite Compression System - Right Lower Extremity Notes kerlix and coban from toes to 3cm below the knee Electronic Signature(s) DALAYSIA, GRIERSON ([id number]) Signed: 12/19/2015 2:49:22 PM By: [name] Entered By: [name] on 12/19/2015 14:49:22 Dina, Azula [name] ([id number]) -------------------------------------------------------------------------------- Wound Assessment Details Patient Name: [name] Date of Service: 12/19/2015 2:15 PM Medical Record Number: [id number] Patient Account Number: [id number] Date of Birth/Sex: 04/18/1946 (70 y.o. Female) Treating RN: [name] Primary Care Physician: [name] Other Clinician: Referring Physician: Glendon [name] Treating Physician/Extender: [name] in Treatment: 72 Wound Status Wound Number: 6 Primary Diabetic Wound/Ulcer of the Lower Etiology: Extremity Wound Location: Right Lower Leg - Medial Wound Open Wounding Event: Gradually Appeared Status: Date Acquired: 10/10/2015 Comorbid Cataracts, Asthma, Coronary Artery Weeks Of Treatment: 10 History: Disease, Hypertension, Type II Clustered Wound: No Diabetes, Osteoarthritis, Neuropathy Photos Photo Uploaded By: [name] on 12/19/2015 17:22:13 Wound Measurements Length: (cm) 6.1 Width: (cm) 4.5 Depth: (cm) 0.2 Area: (cm) 21.559 Volume: (cm) 4.312 % Reduction in Area: -523.8% % Reduction in Volume: -1146.2% Epithelialization: None Tunneling: No Undermining: No Wound Description Classification: Grade 1 Wound Margin: Distinct, outline attached Exudate Amount: Medium Exudate  Type: Purulent Exudate Color: yellow, brown, green Foul Odor After Cleansing: No Wound Bed Granulation Amount: Small (1-33%) Exposed Structure Granulation Quality: Red Fascia Exposed: No Necrotic Amount: Large (67-100%) Fat Layer Exposed: No Necrotic Quality: Eschar, Adherent Slough Tendon Exposed: No Lok, Madelon [name] ([id number]) Muscle Exposed: No Joint Exposed: No Bone Exposed: No Limited to Skin Breakdown Periwound Skin Texture Texture Color No Abnormalities Noted: No No Abnormalities Noted: No Callus: No Atrophie Blanche: No Crepitus: No Cyanosis: No Excoriation: No Ecchymosis: No Fluctuance: No Erythema: No Friable: No Hemosiderin Staining: No Induration: No Mottled: No Localized Edema: No Pallor: No Rash: No Rubor: No Scarring: No Temperature / Pain Moisture Temperature: No Abnormality No Abnormalities Noted: No Tenderness on Palpation: Yes Dry / Scaly: No Maceration: No Moist: Yes Wound Preparation Ulcer Cleansing: Rinsed/Irrigated with Saline Topical Anesthetic Applied: Other: lidocaine 4%, Treatment Notes Wound #6 (Right, Medial Lower Leg) 1. Cleansed with: Clean wound with Normal Saline 2. Anesthetic Topical Lidocaine 4% cream to wound bed prior to debridement 4. Dressing Applied: Santyl Ointment  5. Secondary Dressing Applied ABD Pad Dry Gauze 7. Secured with 2 Layer Lite Compression System - Right Lower Extremity Notes kerlix and coban from toes to 3cm below the knee Electronic Signature(s) Signed: 12/19/2015 2:49:57 PM By: [name] Entered By: [name] on 12/19/2015 14:49:57 Hauter, Mayrin [name] ([id number]) Wehrly, Rayssa [name] ([id number]) -------------------------------------------------------------------------------- Vitals Details Patient Name: JOELYS, GLASGOW [name] Date of Service: 12/19/2015 2:15 PM Medical Record Number: [id number] Patient Account Number: [id number] Date of Birth/Sex: December 31, 1945 (70 y.o. Female) Treating RN: [name] Primary Care Physician: [name] Other Clinician: Referring Physician: Glendon [name] Treating Physician/Extender: [name] in Treatment: 46 Vital Signs Time Taken: 14:30 Temperature (F): 97.5 Height (in): 65 Pulse (bpm): 82 Weight (lbs): 248 Respiratory Rate (breaths/min): 18 Body Mass Index (BMI): 41.3 Blood Pressure (mmHg): 150/57 Reference Range: 80 - 120 mg / dl Electronic Signature(s) Signed: 12/19/2015 5:44:12 PM By: [name] Entered By: [name] on 12/19/2015 14:32:07

## 2015-12-20 NOTE — Progress Notes (Signed)
[name] ([id number]) Visit Report for 12/19/2015 Chief Complaint Document Details Patient Name: [name], [name] 12/19/2015 2:15 Date of Service: PM Medical Record [id number] Number: Patient Account Number: [id number] 1946-04-15 (70 y.o. Treating RN: [name] Date of Birth/Sex: Female) Other Clinician: Primary Care Physician: [facility], Delana Meyer Treating [name] Referring Physician: Glendon Axe Physician/Extender: Weeks in Treatment: 42 Information Obtained from: Patient Chief Complaint Patient presents to the wound care center for a consult due non healing wound 70 year old patient comes with a history of having a ulcer on the left lower extremity for the past 4 weeks. she says she's had swelling of both lower extremities for about a year after she started having prednisone. 02/07/2015 -- her vascular appointments obtained were in the first and third week of June. she is able to go to [place] and we will try and get her some earlier appointments. Other than that nothing else has changed in her management. Electronic Signature(s) Signed: 12/19/2015 3:16:32 PM By: [name], [name] Entered By: [name] on 12/19/2015 15:16:32 Strandberg, GEARLDINE BRUMMOND ([id number]) -------------------------------------------------------------------------------- Debridement Details Patient Name: [name], [name] 12/19/2015 2:15 Date of Service: PM Medical Record [id number] Number: Patient Account Number: [id number] [birth year] (70 y.o. Treating RN: [name] Date of Birth/Sex: Female) Other Clinician: Primary Care Physician: [facility], Delana Meyer Treating Zehava Turski Referring Physician: Glendon Axe Physician/Extender: Weeks in Treatment: 46 Debridement Performed for Wound #6 Right,Medial Lower Leg Assessment: Performed By: Physician [name], MD Debridement: Debridement Pre-procedure Yes Verification/Time Out Taken: Start Time: 15:03 Pain Control: Lidocaine 4%  Topical Solution Level: Skin/Subcutaneous Tissue Total Area Debrided (L x 6.1 (cm) x 4.5 (cm) = 27.45 (cm) W): Tissue and other Viable, Non-Viable, Eschar, Fibrin/Slough, Subcutaneous material debrided: Instrument: Forceps, Scissors Bleeding: None End Time: 15:05 Procedural Pain: 0 Post Procedural Pain: 0 Response to Treatment: Procedure was tolerated well Post Debridement Measurements of Total Wound Length: (cm) 6.1 Width: (cm) 4.5 Depth: (cm) 0.2 Volume: (cm) 4.312 Post Procedure Diagnosis Same as Pre-procedure Electronic Signature(s) Signed: 12/19/2015 3:16:25 PM By: [name], [name] Signed: 12/19/2015 5:44:12 PM By: [name] Entered By: [name] on 12/19/2015 15:16:25 Diers, [name] DMarland Kitchen ([id number]) -------------------------------------------------------------------------------- HPI Details Patient Name: [name], [name] 12/19/2015 2:15 Date of Service: PM Medical Record [id number] Number: Patient Account Number: [id number] Aug 13, 1946 (70 y.o. Treating RN: [name] Date of Birth/Sex: Female) Other Clinician: Primary Care Physician: [facility], Delana Meyer Treating Aunica Dauphinee Referring Physician: Glendon Axe Physician/Extender: Weeks in Treatment: 48 History of Present Illness HPI Description: 70 year old patient who is known to have diabetes mellitus type 2, chronic renal insufficiency, coronary artery disease, hypertension, hypercholesterolemia, temporal arteritis and inflammatory arthritiss also has a history of having a hysterectomy and some orthopedic related surgeries. The ulcer on the left lower extremity started off as a blister and then. Got progressively worse. She does not have any fever or chills and has not had any recent surgical intervention for this. Her last hemoglobin A1c was 10.1 in September 2015. She has been recently put on doxycycline by her PCP. She is now also allergic to doxycycline and was this was changed over to  Keflex. 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 She does see a cardiologist and also takes a diuretic. 02/07/2015 her arterial and venous duplex studies to be done have dates been given as the first and third week of June. This is at [facility]. We  are going to try and get early appointments at [facility]. other than that nothing has changed in her management. 02/14/2015 -- we have been able to get her an appointment in [facility] on May 20 which is much earlier than her previous ones at [place]. She continues with her prednisone and her sugars are in the range of 150-200. 02/21/2015 We were able to get a vascular lab workup for her today and she is going to be there at 2:00 this afternoon. the swelling of her leg has gone down significantly but she still has some tenderness over the wounds. 02/28/2015 - She has had one of two vascular workups done, and this coming Tuesday has another, at [facility]. She continues to be on steroid medications. She has significant sensitivity in her left lower extremity and has pain suggestive of neuropathic pain and I have asked her to address this with her primary care physician. 03/07/2015 -- The patient saw Dr. Lucky Cowboy for a consultation and he has had her arteries are okay but she has 2 incompetent veins on the left lower extremity and he is going to set her up for surgery. Official report is awaited. Addendum: Official reports are now available and on 03/04/2015. She was seen and lower extremity venous duplex exam was done. There was reflux present within the left greater saphenous vein below the knee and also the left small saphenous vein. Arterial duplex showed normal triphasic waveforms throughout the left lower extremity without any significant stenosis. Her ABIs were noncompressible bilaterally but a waveforms  were normal and a digital pressures were normal bilaterally consistent with no significant arterial insufficiency. He has recommended endovenous ablation of both the left small saphenous and the left great saphenous vein. This would still be scheduled later. 03/14/2015 -- she has heard back from the vascular office and has surgery scheduled for sometime in July. AKRITI, REIMOLD ([id number]) Her rheumatologist has decreased her prednisone dosage but she still on it. She has also had cataract surgery in her right eye recently this week. 03/20/2015 - No new complaints today. Pain improved. No fever or chills. Tolerating 2 layer compression. 04/14/2015 -- she was doing very well today she went off on vacation and now her edema has increased markedly the ulceration is bigger and her diabetes is not under control. 04/21/2015 -- I spoke to her PCP Dr. Candiss Norse and discussed the management which would include being seen by a general surgeon for debridement and taking multiple punch biopsies which would help in establishing the diagnosis of this is a vasculitis. She is agreeable about this and will set her up for the procedure with Dr. Tamala Julian at [facility]. She was seen by the surgeon Dr. Jamal Collin. 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 She is scheduled to have laser ablation done next week.Pt was sent here for possible biopsy to look for vasculitis. Feel it would be better to wait after laser ablation is completed- the ulcer may heal fully and biopsy may not be necessary 04/29/2015 -- she had the venous ablation done by Dr. Lucky Cowboy last Friday and we do not have any notes yet. She is doing fine otherwise. 05/06/2015 --Review of her recent vascular intervention shows that she was seen by Dr. Lucky Cowboy on 04/29/2015. The follow-up duplex which was done showed that both the great saphenous vein and  the small saphenous vein remained patent with reflux consistent with  an unsuccessful ablation. He has rescheduled her for another the endovenous ablation to be done in about 4 weekso time. 05/13/2015 -- he was seen by her surgeon Dr. Jamal Collin who asked her to continue with conservative therapy and he would speak to Dr. Lucky Cowboy about her management. Dr. Lucky Cowboy is going to schedule her surgery in the middle of August for a repeat endovenous ablation. Her pus culture from last week has grown : Osino her noted her sensitivity report but due to her multiple allergies I had tried clindamycin and she developed a rash with this too. She has been prescribed and anti-buttocks in the ER and is has it at home and she will let is know what she is going to be taking. 05/20/2015 -- she has developed a small spot on her right lower extremity but besides that it is not a full fledged ulceration. She did not get to see Dr. Lucky Cowboy last week and hopefully she will see him in the near future. 05/27/2015 -she is still awaiting her appointment with Dr.Dew and her vascular procedure is not scheduled until August 19. She will be seeing her PCP tomorrow and I have asked her to convey our discussion so that she is aware that debridement has not been done yet. 06/03/2015 -- was seen by her rheumatologist Dr. 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 He is lowering her prednisone to 12-1/2 mg for 1 month and then 10 mg per the next month. I will again make an attempt to speak to her PCP Dr. Candiss Norse and her surgeon Dr. 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 06/17/2015 -- Dr.Dew did her vascular procedure last week and a  follow-up venous ultrasound shows good resolution of the veins as per the patient's history. He is to see her back in 2 weeks. STAYCE, KOCA ([id number]) 07/07/2015. -- the patient has had a heavy growth of Proteus mirabilis and Enterococcus faecalis. These are sensitive to several drugs but the problem is she has allergies to all of these and hence I would like her to see Dr. Ola Spurr for this. She is also due to see Dr. Lucky Cowboy tomorrow and I will discussed the management with him including debridement under anesthesia and possible biopsies. 07/14/2015 -- she has an appointment to see Dr. Ola Spurr tomorrow and did see Dr. Bunnie Domino PA who will discuss my request with him. 07/21/2015 -- saw Dr. Ola Spurr was able to do a test on her and has put her on amoxicillin. She has been tolerating that and has had no problems with allergies to this. 07/28/2015 -- Last Friday I spoke to Dr. Leotis Pain regarding her care and he said that her right leg did not need any surgery and on the left leg was doing pretty good. We did agree that if she undergoes any procedure in the future he would do a couple of punch biopsies of the wound. 08/18/2015 -- her right leg is very tender and there is significant amount of slough. The left leg is looking much better 09/02/2015 -- she is going to have a debridement and punch biopsies of her right lower extremity by Dr. Leotis Pain this coming Thursday. Also seen Dr. Ola Spurr who has continued her on ciprofloxacin. 09/09/2015 -- on  09/04/2015 Dr. Leotis Pain took her to surgery - Irrigation and excisional debridement of skin, soft tissue, and muscle to about 40 cm2 to the right posterior calf with biopsy. Pathology results are -- DIAGNOSIS: SKIN, RIGHT LOWER EXTREMITY; BIOPSY: SKIN AND SOFT TISSUE WITH ULCERATION, SEE NOTE. - NEGATIVE FOR DYSPLASIA AND MALIGNANCY. Note: There is acute inflammation and ulceration of the epidermis. The dermis shows nonspecific inflammation,  neovascularization, and hemosiderin deposition. The differential diagnosis for these findings includes stasis dermatitis, nonspecific dermatitis, and infection. Correlation with clinical findings is required. A PAS fungal stain is obtained and results will be reported in an addendum. cultures were also sent and this grew Pseudomonas aeruginosa, Escherichia coli, Proteus mirabilis, Klebsiella pneumoniae and they were all sensitive to ciprofloxacin which she is on. 09/16/2015 -- he saw Dr. 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 He is going to call the patient in and discuss with her the management possibly with Imuran. I will continue treating her locally. 09/23/2015 -- she saw Dr. Ola Spurr today who was going to continue the antibiotics for now and stop after this course. She has an appointment to see Dr. Jefm Bryant in about a week's time. Her wound VAC has arrived but she did not bring it with her today. We will try and set her up for changes to the right leg 3 times a week. She is here for her first application of Apligraf to the left lower extremity. 09/30/2015 -- she has seen Dr. 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 because she is ambulatory at home health will not apply a wound VAC and she will have to come here 3 times a week on Monday Wednesday and Friday. 10/10/2015 -- she is here for a second application of Apligraf to her left lower extremity. 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 Jefm Bryant. Other than that she is doing well 10/31/2015 -- she is here for a third application of Apligraf to her left lower extremity. after reviewing her wound on the right side I noted that it is granulating extremely  well and we will use the remnants of the Apligraf on the right leg. DARCEL, STOKLEY ([id number]) 11/14/2015 -- she was recently admitted to the hospital between January 11 and 11/10/2015 for uncontrolled hypertension, diabetes mellitus, acute kidney injury. during her admission she was supported by wound care and also by antibiotics. Around this time her immunosuppression was stopped by Dr. Nunzio Cory. She is feeling much better now. 11/21/2015 -- she has had her fourth application of Apligraf today and it has been shared between the left and the right leg 11/24/2015 -- was here for a wound VAC change but it was noticed that she had profuse bleeding from the lobe medial part of her right lower extremity and I was called in to evaluate her. 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 She has been doing fine since then 12/19/2015 -- she has finished 5 applications of Apligraf to the left lower extremity and this is doing great. He began with pain and swelling of her right ankle a couple of days ago and it has caused her much distress. She does not have any fever  or any change in her general health Electronic Signature(s) Signed: 12/19/2015 3:18:03 PM By: [name], [name] Entered By: [name] on 12/19/2015 15:18:02 Mizner, JESSIECA ZEMAN ([id number]) -------------------------------------------------------------------------------- Physical Exam Details Patient Name: JOEL, YORK D. 12/19/2015 2:15 Date of Service: PM Medical Record [id number] Number: Patient Account Number: [id number] 09-30-1946 (70 y.o. Treating RN: [name] Date of Birth/Sex: Female) Other Clinician: Primary Care Physician: [facility], Delana Meyer Treating [name] Referring Physician: Glendon Axe Physician/Extender: Weeks in Treatment: 46 Constitutional . Pulse regular. Respirations normal and unlabored. Afebrile.  . Eyes Nonicteric. Reactive to light. Ears, Nose, Mouth, and Throat Lips, teeth, and gums WNL.Marland Kitchen Moist mucosa without lesions. Neck supple and nontender. No palpable supraclavicular or cervical adenopathy. Normal sized without goiter. Respiratory WNL. No retractions.. Cardiovascular Pedal Pulses WNL. No clubbing, cyanosis or edema. Lymphatic No adneopathy. No adenopathy. No adenopathy. Musculoskeletal Adexa without tenderness or enlargement.. Digits and nails w/o clubbing, cyanosis, infection, petechiae, ischemia, or inflammatory conditions.. Integumentary (Hair, Skin) No suspicious lesions. No crepitus or fluctuance. No peri-wound warmth or erythema. No masses.Marland Kitchen Psychiatric Judgement and insight Intact.. No evidence of depression, anxiety, or agitation.. Notes the left lower extremity looks excellent and the wound is completely heparinized and I will use a bordered foam on this area and use her compression stockings. The right leg medially has the ulceration which has subcutaneous slough and I have sharply debrided this with forceps and scissors. The posterior lateral wound may be a previously applied at Apligraf is looking beefy red and has some hyper granulation tissue. She has edema of ankle and foot but this is not cellulitis. He has not been using her compression stockings for the last few days. Electronic Signature(s) Signed: 12/19/2015 3:19:22 PM By: [name], [name] BAHAREH, DENISCO ([id number]) Entered By: [name] on 12/19/2015 15:19:22 URSULA, SINEATH ([id number]) -------------------------------------------------------------------------------- Physician Orders Details Patient Name: SHARESA, PALLESCHI 12/19/2015 2:15 Date of Service: PM Medical Record [id number] Number: Patient Account Number: [id number] 1946-02-07 (70 y.o. Treating RN: [name] Date of Birth/Sex: Female) Other Clinician: Primary Care Physician: [facility], Delana Meyer Treating [name] Referring Physician: Glendon Axe Physician/Extender: Weeks in Treatment: 65 Verbal / Phone Orders: Yes Clinician: [name] Read Back and Verified: Yes Diagnosis Coding Wound Cleansing Wound #2 Left,Distal Lower Leg o Cleanse wound with mild soap and water o May shower with protection. Wound #5 Right,Posterior Lower Leg o Cleanse wound with mild soap and water o May shower with protection. Wound #6 Right,Medial Lower Leg o Cleanse wound with mild soap and water o May shower with protection. Skin Barriers/Peri-Wound Care Wound #2 Left,Distal Lower Leg o Skin Prep Wound #5 Right,Posterior Lower Leg o Skin Prep Wound #6 Right,Medial Lower Leg o Skin Prep Primary Wound Dressing Wound #2 Left,Distal Lower Leg o Mepitel One Wound #5 Right,Posterior Lower Leg o Hydrafera Blue Wound #6 Right,Medial Lower Leg o Santyl Ointment Secondary Dressing Wound #2 Left,Distal Lower Leg Lepp, Marylynne D. ([id number]) o Boardered Foam Dressing Wound #5 Right,Posterior Lower Leg o ABD pad Wound #6 Right,Medial Lower Leg o ABD pad Dressing Change Frequency Wound #2 Left,Distal Lower Leg o Other: - change on Monday at [facility] Wound #5 Right,Posterior Lower Leg o Other: - change on Monday at [facility] Wound #6 Right,Medial Lower Leg o Other: - change on Monday at [facility] Follow-up Appointments Wound #2 Left,Distal Lower Leg o Other: - Monday Wound #5 Right,Posterior Lower Leg o Other: - Monday Wound #6 Right,Medial Lower Leg o Other: - Monday Edema Control Wound #2 Left,Distal  Lower Leg o Patient to wear own compression stockings Wound #5 Right,Posterior Lower Leg o 2 Layer Lite Compression System - Right Lower Extremity Wound #6 Right,Medial Lower Leg o 2 Layer Lite Compression System - Right Lower Extremity Electronic Signature(s) Signed: 12/19/2015 4:56:18 PM By: [name], [name] Signed: 12/19/2015 5:44:12 PM By: [name] Entered By: [name] on 12/19/2015 15:09:39 Carvey, NELVA HOAR ([id number]) -------------------------------------------------------------------------------- Problem List Details Patient Name: LASHERA, CUROLE D. 12/19/2015 2:15 Date of Service: PM Medical Record [id number] Number: Patient Account Number: [id number] 06/07/1946 (70 y.o. 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 01/31/2015 Yes exposed E66.01 Morbid (severe) obesity due to excess calories 01/31/2015 Yes I89.0 Lymphedema, not elsewhere classified 01/31/2015 Yes L03.116 Cellulitis of left lower limb 07/07/2015 Yes L97.212 Non-pressure chronic ulcer of right calf with fat layer 11/24/2015 Yes exposed I87.311 Chronic venous hypertension (idiopathic) with ulcer of 11/24/2015 Yes right lower extremity Inactive Problems Resolved Problems ICD-10 Code Description Active Date Resolved Date I83.222 Varicose veins of left lower extremity with both ulcer of 03/07/2015 03/07/2015 calf and inflammation GERREN, DEROOS ([id number]) I83.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 with both ulcer of 03/07/2015 03/07/2015 ankle and inflammation Electronic Signature(s) Signed: 12/19/2015 3:16:03 PM By: [name], [name] Entered By: [name] on 12/19/2015 15:16:02 Sussman, Dijon DMarland Kitchen ([id number]) -------------------------------------------------------------------------------- Progress Note Details Patient Name: [name] 12/19/2015 2:15 Date of Service: PM Medical Record [id number] Number: Patient Account Number: [id number] [birth year] (70 y.o. Treating RN:  [name] Date of Birth/Sex: Female) Other Clinician: Primary Care Physician: [facility], Delana Meyer Treating [name] Referring Physician: Glendon Axe Physician/Extender: Weeks in Treatment: 13 Subjective Chief Complaint Information obtained from Patient Patient presents to the wound care center for a consult due non healing wound 70 year old patient comes with a history of having a ulcer on the left lower extremity for the past 4 weeks. she says she's had swelling of both lower extremities for about a year after she started having prednisone. 02/07/2015 -- her vascular appointments obtained were in the first and third week of June. she is able to go to [place] and we will try and get her some earlier appointments. Other than that nothing else has changed in her management. History of Present Illness (HPI) 70 year old patient who is known to have diabetes mellitus type 2, chronic renal insufficiency, coronary artery disease, hypertension, hypercholesterolemia, temporal arteritis and inflammatory arthritiss also has a history of having a hysterectomy and some orthopedic related surgeries. The ulcer on the left lower extremity started off as a blister and then. Got progressively worse. She does not have any fever or chills and has not had any recent surgical intervention for this. Her last hemoglobin A1c was 10.1 in September 2015. She has been recently put on doxycycline by her PCP. She is now also allergic to doxycycline and was this was changed over to Keflex. 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 She does see a cardiologist and also takes a diuretic. 02/07/2015 her arterial and venous duplex studies to be done have dates been given as the first and third week of  June. This is at [facility]. We are going to try and get early appointments at [facility]. other  than that nothing has changed in her management. 02/14/2015 -- we have been able to get her an appointment in [facility] on May 20 which is much earlier than her previous ones at [place]. She continues with her prednisone and her sugars are in the range of 150-200. 02/21/2015 We were able to get a vascular lab workup for her today and she is going to be there at 2:00 this afternoon. the swelling of her leg has gone down significantly but she still has some tenderness over the wounds. 02/28/2015 - She has had one of two vascular workups done, and this coming Tuesday has another, at [facility]. She continues to be on steroid medications. She has significant sensitivity in her left lower extremity and has pain suggestive of neuropathic pain and I have asked her to address this with her primary care physician. 03/07/2015 -- The patient saw Dr. Lucky Cowboy for a consultation and he has had her arteries are okay but she has 2 incompetent veins on the left lower extremity and he is going to set her up for surgery. Official report is DAISHA, HORSE ([id number]) awaited. Addendum: Official reports are now available and on 03/04/2015. She was seen and lower extremity venous duplex exam was done. There was reflux present within the left greater saphenous vein below the knee and also the left small saphenous vein. Arterial duplex showed normal triphasic waveforms throughout the left lower extremity without any significant stenosis. Her ABIs were noncompressible bilaterally but a waveforms were normal and a digital pressures were normal bilaterally consistent with no significant arterial insufficiency. He has recommended endovenous ablation of both the left small saphenous and the left great saphenous vein. This would still be scheduled later. 03/14/2015 -- she has heard back from the vascular office and has surgery scheduled for sometime in July. Her rheumatologist has decreased her  prednisone dosage but she still on it. She has also had cataract surgery in her right eye recently this week. 03/20/2015 - No new complaints today. Pain improved. No fever or chills. Tolerating 2 layer compression. 04/14/2015 -- she was doing very well today she went off on vacation and now her edema has increased markedly the ulceration is bigger and her diabetes is not under control. 04/21/2015 -- I spoke to her PCP Dr. Candiss Norse and discussed the management which would include being seen by a general surgeon for debridement and taking multiple punch biopsies which would help in establishing the diagnosis of this is a vasculitis. She is agreeable about this and will set her up for the procedure with Dr. Tamala Julian at [facility]. She was seen by the surgeon Dr. Jamal Collin. 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 She is scheduled to have laser ablation done next week.Pt was sent here for possible biopsy to look for vasculitis. Feel it would be better to wait after laser ablation is completed- the ulcer may heal fully and biopsy may not be necessary 04/29/2015 -- she had the venous ablation done by Dr. Lucky Cowboy last Friday and we do not have any notes yet. She is doing fine otherwise. 05/06/2015 --Review of her recent vascular intervention shows that she was seen by Dr. Lucky Cowboy on 04/29/2015. The follow-up duplex which was done showed that both the great saphenous vein and the small saphenous  vein remained patent with reflux consistent with an unsuccessful ablation. He has rescheduled her for another the endovenous ablation to be done in about 4 weeks time. 05/13/2015 -- he was seen by her surgeon Dr. Jamal Collin who asked her to continue with conservative therapy and he would speak to Dr. Lucky Cowboy about her management. Dr. Lucky Cowboy is going to schedule her surgery in the middle of August for a repeat  endovenous ablation. Her pus culture from last week has grown : Bassett her noted her sensitivity report but due to her multiple allergies I had tried clindamycin and she developed a rash with this too. She has been prescribed and anti-buttocks in the ER and is has it at home and she will let is know what she is going to be taking. 05/20/2015 -- she has developed a small spot on her right lower extremity but besides that it is not a full fledged ulceration. She did not get to see Dr. Lucky Cowboy last week and hopefully she will see him in the near future. 05/27/2015 -she is still awaiting her appointment with Dr.Dew and her vascular procedure is not scheduled until August 19. She will be seeing her PCP tomorrow and I have asked her to convey our discussion so that she is aware that debridement has not been done yet. MADASIN, DEMELLO ([id number]) 06/03/2015 -- was seen by her rheumatologist Dr. 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 He is lowering her prednisone to 12-1/2 mg for 1 month and then 10 mg per the next month. I will again make an attempt to speak to her PCP Dr. Candiss Norse and her surgeon Dr. 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 06/17/2015 -- Dr.Dew did her vascular procedure last week and a follow-up venous ultrasound shows good resolution of the veins as per the patient's history. He is to see her back in 2 weeks. 07/07/2015. -- the patient has had a heavy growth of Proteus mirabilis and Enterococcus faecalis. These are sensitive to several drugs but the problem is she has allergies to all of these and hence I would like her to see Dr. Ola Spurr for this. She is also due to  see Dr. Lucky Cowboy tomorrow and I will discussed the management with him including debridement under anesthesia and possible biopsies. 07/14/2015 -- she has an appointment to see Dr. Ola Spurr tomorrow and did see Dr. Bunnie Domino PA who will discuss my request with him. 07/21/2015 -- saw Dr. Ola Spurr was able to do a test on her and has put her on amoxicillin. She has been tolerating that and has had no problems with allergies to this. 07/28/2015 -- Last Friday I spoke to Dr. Leotis Pain regarding her care and he said that her right leg did not need any surgery and on the left leg was doing pretty good. We did agree that if she undergoes any procedure in the future he would do a couple of punch biopsies of the wound. 08/18/2015 -- her right leg is very tender and there is significant amount of slough. The left leg is looking much better 09/02/2015 -- she is going to have a debridement and punch biopsies of her right lower extremity by Dr. Leotis Pain this coming Thursday. Also seen Dr. Ola Spurr who  has continued her on ciprofloxacin. 09/09/2015 -- on 09/04/2015 Dr. Leotis Pain took her to surgery - Irrigation and excisional debridement of skin, soft tissue, and muscle to about 40 cm2 to the right posterior calf with biopsy. Pathology results are -- DIAGNOSIS: SKIN, RIGHT LOWER EXTREMITY; BIOPSY: SKIN AND SOFT TISSUE WITH ULCERATION, SEE NOTE. - NEGATIVE FOR DYSPLASIA AND MALIGNANCY. Note: There is acute inflammation and ulceration of the epidermis. The dermis shows nonspecific inflammation, neovascularization, and hemosiderin deposition. The differential diagnosis for these findings includes stasis dermatitis, nonspecific dermatitis, and infection. Correlation with clinical findings is required. A PAS fungal stain is obtained and results will be reported in an addendum. cultures were also sent and this grew Pseudomonas aeruginosa, Escherichia coli, Proteus mirabilis, Klebsiella pneumoniae and they were all  sensitive to ciprofloxacin which she is on. 09/16/2015 -- he saw Dr. 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 He is going to call the patient in and discuss with her the management possibly with Imuran. I will continue treating her locally. 09/23/2015 -- she saw Dr. Ola Spurr today who was going to continue the antibiotics for now and stop after this course. She has an appointment to see Dr. Jefm Bryant in about a week's time. Her wound VAC has arrived but she did not bring it with her today. We will try and set her up for changes to the right leg 3 times a week. She is here for her first application of Apligraf to the left lower extremity. JANAIS, DEGLER ([id number]) 09/30/2015 -- she has seen Dr. 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 because she is ambulatory at home health will not apply a wound VAC and she will have to come here 3 times a week on Monday Wednesday and Friday. 10/10/2015 -- she is here for a second application of Apligraf to her left lower extremity. 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 Jefm Bryant. Other than that she is doing well 10/31/2015 -- she is here for a third application of Apligraf to her left lower extremity. after reviewing her wound on the right side I noted that it is granulating extremely well and we will use the remnants of the Apligraf on the right leg. 11/14/2015 -- she was recently admitted to the hospital between January 11 and 11/10/2015 for uncontrolled hypertension, diabetes mellitus, acute kidney injury. during her admission she was supported by wound care and also by antibiotics. Around this time her immunosuppression was stopped by Dr. Nunzio Cory. She is feeling much  better now. 11/21/2015 -- she has had her fourth application of Apligraf today and it has been shared between the left and the right leg 11/24/2015 -- was here for a wound VAC change but it was noticed that she had profuse bleeding from the lobe medial part of her right lower extremity and I was called in to evaluate her. 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 She has been doing fine since then 12/19/2015 -- she has finished 5 applications of Apligraf to the left lower extremity and this is doing great. He began with pain and swelling of her right ankle a couple of days ago and it has caused her  much distress. She does not have any fever or any change in her general health Objective Constitutional Pulse regular. Respirations normal and unlabored. Afebrile. Vitals Time Taken: 2:30 PM, Height: 65 in, Weight: 248 lbs, BMI: 41.3, Temperature: 97.5 F, Pulse: 82 bpm, Respiratory Rate: 18 breaths/min, Blood Pressure: 150/57 mmHg. Eyes Nonicteric. Reactive to light. Ears, Nose, Mouth, and Throat Lips, teeth, and gums WNL.Marland Kitchen Moist mucosa without lesions. Neck supple and nontender. No palpable supraclavicular or cervical adenopathy. Normal sized without goiter. Respiratory Ritchey, Afua D. ([id number]) WNL. No retractions.. Cardiovascular Pedal Pulses WNL. No clubbing, cyanosis or edema. Lymphatic No adneopathy. No adenopathy. No adenopathy. Musculoskeletal Adexa without tenderness or enlargement.. Digits and nails w/o clubbing, cyanosis, infection, petechiae, ischemia, or inflammatory conditions.Marland Kitchen Psychiatric Judgement and insight Intact.. No evidence of depression, anxiety, or agitation.. General Notes: the left lower extremity looks excellent and the wound is completely heparinized and I will use a bordered foam on this area and use her compression stockings. The right leg medially has  the ulceration which has subcutaneous slough and I have sharply debrided this with forceps and scissors. The posterior lateral wound may be a previously applied at Apligraf is looking beefy red and has some hyper granulation tissue. She has edema of ankle and foot but this is not cellulitis. He has not been using her compression stockings for the last few days. Integumentary (Hair, Skin) No suspicious lesions. No crepitus or fluctuance. No peri-wound warmth or erythema. No masses.. Wound #2 status is Open. Original cause of wound was Blister. The wound is located on the Left,Distal Lower Leg. The wound measures 0.5cm length x 0.5cm width x 0.1cm depth; 0.196cm^2 area and 0.02cm^3 volume. The wound is limited to skin breakdown. There is no tunneling or undermining noted. There is a small amount of serous drainage noted. The wound margin is flat and intact. There is large (67- 100%) red granulation within the wound bed. There is no necrotic tissue within the wound bed. The periwound skin appearance exhibited: Moist. The periwound skin appearance did not exhibit: Callus, Crepitus, Excoriation, Fluctuance, Friable, Induration, Localized Edema, Rash, Scarring, Dry/Scaly, Maceration, Atrophie Blanche, Cyanosis, Ecchymosis, Hemosiderin Staining, Mottled, Pallor, Rubor, Erythema. The periwound has tenderness on palpation. Wound #5 status is Open. Original cause of wound was Gradually Appeared. The wound is located on the Right,Posterior Lower Leg. The wound measures 5.2cm length x 7.3cm width x 0.1cm depth; 29.814cm^2 area and 2.981cm^3 volume. The wound is limited to skin breakdown. There is no tunneling or undermining noted. There is a large amount of serous drainage noted. The wound margin is flat and intact. There is large (67-100%) red, pink granulation within the wound bed. There is a small (1-33%) amount of necrotic tissue within the wound bed including Adherent Slough. The periwound skin  appearance exhibited: Moist. The periwound skin appearance did not exhibit: Callus, Crepitus, Excoriation, Fluctuance, Friable, Induration, Localized Edema, Rash, Scarring, Dry/Scaly, Maceration, Atrophie Blanche, Cyanosis, Ecchymosis, Hemosiderin Staining, Mottled, Pallor, Rubor, Erythema. The periwound has tenderness on palpation. Wound #6 status is Open. Original cause of wound was Gradually Appeared. The wound is located on the Right,Medial Lower Leg. The wound measures 6.1cm length x 4.5cm width x 0.2cm depth; 21.559cm^2 area and 4.312cm^3 volume. The wound is limited to skin breakdown. There is no tunneling or undermining noted. There is a medium amount of purulent drainage noted. The wound margin is distinct with the outline attached to the wound base. There is small (1-33%) red granulation within the wound bed. There is  a large Wuellner, Gelene D. ([id number]) (67-100%) amount of necrotic tissue within the wound bed including Eschar and Adherent Slough. The periwound skin appearance exhibited: Moist. The periwound skin appearance did not exhibit: Callus, Crepitus, Excoriation, Fluctuance, Friable, Induration, Localized Edema, Rash, Scarring, Dry/Scaly, Maceration, Atrophie Blanche, Cyanosis, Ecchymosis, Hemosiderin Staining, Mottled, Pallor, Rubor, Erythema. Periwound temperature was noted as No Abnormality. The periwound has tenderness on palpation. 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 On the right lower extremity the area medially, with slough will be treated with Santyl. The posterior-lateral ulcer will be treated with [facility] and today we will apply a 2 layer compression wrap. I will see her back on Monday and if the edema is better we will proceed with the application of Apligraf. She understands the treatment plan. Procedures Wound #6 Wound #6 is a Diabetic Wound/Ulcer of the Lower Extremity located on the Right,Medial Lower Leg . There was a Skin/Subcutaneous Tissue Debridement [id number]) debridement with total area of 27.45 sq cm performed by [name], MD. with the following instrument(s): Forceps and Scissors to remove Viable and Non-Viable tissue/material including Fibrin/Slough, Eschar, and Subcutaneous after achieving pain control using Lidocaine 4% Topical Solution. A time out was conducted prior to the start of the procedure. There was no bleeding. The procedure was tolerated well with a pain level of 0 throughout and a pain level of 0 following the procedure. Post Debridement Measurements: 6.1cm length x 4.5cm width x 0.2cm depth; Carlisi, Rowen D. ([id number]) 4.312cm^3 volume. Post procedure Diagnosis Wound #6: Same as Pre-Procedure Plan Wound Cleansing: Wound #2 Left,Distal Lower Leg: Cleanse wound with mild soap and water May shower with protection. Wound #5 Right,Posterior Lower Leg: Cleanse wound with mild soap and water May shower with protection. Wound #6 Right,Medial Lower Leg: Cleanse wound with mild soap and water May shower with protection. Skin Barriers/Peri-Wound Care: Wound #2 Left,Distal Lower Leg: Skin Prep Wound #5 Right,Posterior Lower Leg: Skin Prep Wound #6 Right,Medial Lower Leg: Skin Prep Primary Wound Dressing: Wound #2 Left,Distal Lower Leg: Mepitel One Wound #5  Right,Posterior Lower Leg: Hydrafera Blue Wound #6 Right,Medial Lower Leg: Santyl Ointment Secondary Dressing: Wound #2 Left,Distal Lower Leg: Boardered Foam Dressing Wound #5 Right,Posterior Lower Leg: ABD pad Wound #6 Right,Medial Lower Leg: ABD pad Dressing Change Frequency: Wound #2 Left,Distal Lower Leg: Other: - change on Monday at [facility] Wound #5 Right,Posterior Lower Leg: Other: - change on Monday at [facility] Wound #6 Right,Medial Lower Leg: Other: - change on Monday at [facility] Follow-up Appointments: Wound #2 Left,Distal Lower Leg: UGANDA, MATCZAK ([id number]) Other: - Monday Wound #5 Right,Posterior Lower Leg: Other: - Monday Wound #6 Right,Medial Lower Leg: Other: - Monday Edema Control: Wound #2 Left,Distal Lower Leg: Patient to wear own compression stockings Wound #5 Right,Posterior Lower Leg: 2 Layer Lite Compression System - Right Lower Extremity Wound #6 Right,Medial Lower Leg: 2 Layer Lite Compression System -  Right Lower Extremity As far as her left leg goes she has had 5 applications of Apligraf and the wound is completely healed and we will continue to protect the supple scar with a bordered foam and she will use her compression stockings. On the right lower extremity the area medially, with slough will be treated with Santyl. The posterior-lateral ulcer will be treated with [facility] and today we will apply a 2 layer compression wrap. I will see her back on Monday and if the edema is better we will proceed with the application of Apligraf. She understands the treatment plan. Electronic Signature(s) Signed: 12/19/2015 3:21:14 PM By: [name], [name] Entered By: [name] on 12/19/2015 15:21:14 Choudhry, Chanci D. ([id number]) -------------------------------------------------------------------------------- SuperBill Details Patient Name: MIOSHA, SUITT D. Date of Service: 12/19/2015 Medical Record Number: [id number] Patient Account Number: [id number] Date  of Birth/Sex: [birth year] (70 y.o. Female) Treating RN: [name] Primary Care Physician: Glendon Axe Other Clinician: Referring Physician: Glendon Axe Treating Physician/Extender: Frann Rider in Treatment: 46 Diagnosis Coding ICD-10 Codes Code Description E11.622 Type 2 diabetes mellitus with other skin ulcer L97.322 Non-pressure chronic ulcer of left ankle with fat layer exposed E66.01 Morbid (severe) obesity due to excess calories I89.0 Lymphedema, not elsewhere classified L03.116 Cellulitis of left lower limb L97.212 Non-pressure chronic ulcer of right calf with fat layer exposed I87.311 Chronic venous hypertension (idiopathic) with ulcer of right lower extremity Facility Procedures CPT4 Code Description: [id number] - DEB SUBQ TISSUE 20 SQ CM/< ICD-10 Description Diagnosis E11.622 Type 2 diabetes mellitus with other skin ulcer L97.322 Non-pressure chronic ulcer of left ankle with fat l L97.212 Non-pressure chronic ulcer of right  calf with fat l I87.311 Chronic venous hypertension (idiopathic) with ulcer Modifier: ayer exposed ayer exposed of right lowe Quantity: 1 r extremity CPT4 Code Description: [id number] - DEB SUBQ TISS EA ADDL 20CM ICD-10 Description Diagnosis L97.322 Non-pressure chronic ulcer of left ankle with fat l E11.622 Type 2 diabetes mellitus with other skin ulcer L97.212 Non-pressure chronic ulcer of right  calf with fat l I87.311 Chronic venous hypertension (idiopathic) with ulcer Modifier: ayer exposed ayer exposed of right lowe Quantity: 1 r extremity Physician Procedures CPT4 Code Description: [id number] - WC PHYS SUBQ TISS 20 SQ CM ICD-10 Description Diagnosis E11.622 Type 2 diabetes mellitus with other skin ulcer Stotts, Kyra D. ([id number]) Modifier: Quantity: 1 Electronic Signature(s) Signed: 12/19/2015 3:21:43 PM By: [name], [name] Entered By: [name] on 12/19/2015 15:21:43

## 2015-12-22 ENCOUNTER — Encounter: Payer: Commercial Managed Care - HMO | Admitting: Surgery

## 2015-12-22 DIAGNOSIS — E11622 Type 2 diabetes mellitus with other skin ulcer: Secondary | ICD-10-CM | POA: Diagnosis not present

## 2015-12-23 NOTE — Progress Notes (Signed)
LILIE, DEKKER ([id number]) Visit Report for 12/22/2015 Chief Complaint Document Details Patient Name: [name], [name] 12/22/2015 9:15 Date of Service: AM Medical Record [id number] Number: Patient Account Number: [id number] September 28, 1946 (70 y.o. Treating RN: Ahmed Prima Date of Birth/Sex: Female) Other Clinician: Primary Care Physician: [facility], Delana Meyer Treating [name] Referring Physician: Glendon Axe Physician/Extender: Weeks in Treatment: 73 Information Obtained from: Patient Chief Complaint Patient presents to the wound care center for a consult due non healing wound 70 year old patient comes with a history of having a ulcer on the left lower extremity for the past 4 weeks. she says she's had swelling of both lower extremities for about a year after she started having prednisone. 02/07/2015 -- her vascular appointments obtained were in the first and third week of June. she is able to go to [place] and we will try and get her some earlier appointments. Other than that nothing else has changed in her management. Electronic Signature(s) Signed: 12/22/2015 10:45:32 AM By: [name], [name] Entered By: [name] on 12/22/2015 10:45:32 Schnetzer, ZENDA DUNAGAN ([id number]) -------------------------------------------------------------------------------- Cellular or Tissue Based Product Details Patient Name: [name], [name] 12/22/2015 9:15 Date of Service: AM Medical Record [id number] Number: Patient Account Number: [id number] [birth year] (70 y.o. Treating RN: Ahmed Prima Date of Birth/Sex: Female) Other Clinician: Primary Care Physician: [facility], Delana Meyer Treating [name] Referring Physician: Glendon Axe Physician/Extender: Weeks in Treatment: 46 Cellular or Tissue Based Wound #5 Right,Posterior Lower Leg Product Type Applied to: Performed By: Physician [name], MD Cellular or Tissue Based Apligraf Product Type: Time-Out Taken: Yes Location: trunk /  arms / legs Wound Size (sq cm): 37.96 Product Size (sq cm): 44 Waste Size (sq cm): 6 Waste Reason: WOUND SIZE Amount of Product Applied (sq cm): 38 Lot #: [id number] Expiration Date: 12/25/2015 Fenestrated: Yes Instrument: Blade Reconstituted: Yes Solution Type: SALINE Solution Amount: 2 ml Lot #: [id number] Solution Expiration 08/25/2017 Date: Secured: Yes Secured With: Steri-Strips Dressing Applied: Yes Primary Dressing: mepitel Procedural Pain: 0 Post Procedural Pain: 0 Response to Treatment: Procedure was tolerated well Post Procedure Diagnosis Same as Pre-procedure Electronic Signature(s) Signed: 12/22/2015 10:45:10 AM By: [name], [name] Pardo, Patrycja D. ([id number]) Entered By: [name] on 12/22/2015 10:45:09 Netherland, Silvanna D. ([id number]) -------------------------------------------------------------------------------- HPI Details Patient Name: [name], [name] 12/22/2015 9:15 Date of Service: AM Medical Record [id number] Number: Patient Account Number: [id number] 11-26-1945 (70 y.o. Treating RN: Ahmed Prima Date of Birth/Sex: Female) Other Clinician: Primary Care Physician: [facility], Delana Meyer Treating Lisvet Rasheed Referring Physician: Glendon Axe Physician/Extender: Weeks in Treatment: 51 History of Present Illness HPI Description: 70 year old patient who is known to have diabetes mellitus type 2, chronic renal insufficiency, coronary artery disease, hypertension, hypercholesterolemia, temporal arteritis and inflammatory arthritiss also has a history of having a hysterectomy and some orthopedic related surgeries. The ulcer on the left lower extremity started off as a blister and then. Got progressively worse. She does not have any fever or chills and has not had any recent surgical intervention for this. Her last hemoglobin A1c was 10.1 in September 2015. She has been recently put on doxycycline by her PCP. She is now also allergic to doxycycline and  was this was changed over to Keflex. 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 She does see a cardiologist and also takes a diuretic. 02/07/2015 her arterial and venous duplex studies to be done have dates been  given as the first and third week of June. This is at [facility]. We are going to try and get early appointments at [facility]. other than that nothing has changed in her management. 02/14/2015 -- we have been able to get her an appointment in [facility] on May 20 which is much earlier than her previous ones at [place]. She continues with her prednisone and her sugars are in the range of 150-200. 02/21/2015 We were able to get a vascular lab workup for her today and she is going to be there at 2:00 this afternoon. the swelling of her leg has gone down significantly but she still has some tenderness over the wounds. 02/28/2015 - She has had one of two vascular workups done, and this coming Tuesday has another, at [facility]. She continues to be on steroid medications. She has significant sensitivity in her left lower extremity and has pain suggestive of neuropathic pain and I have asked her to address this with her primary care physician. 03/07/2015 -- The patient saw Dr. Lucky Cowboy for a consultation and he has had her arteries are okay but she has 2 incompetent veins on the left lower extremity and he is going to set her up for surgery. Official report is awaited. Addendum: Official reports are now available and on 03/04/2015. She was seen and lower extremity venous duplex exam was done. There was reflux present within the left greater saphenous vein below the knee and also the left small saphenous vein. Arterial duplex showed normal triphasic waveforms throughout the left lower extremity without any significant stenosis. Her ABIs were noncompressible  bilaterally but a waveforms were normal and a digital pressures were normal bilaterally consistent with no significant arterial insufficiency. He has recommended endovenous ablation of both the left small saphenous and the left great saphenous vein. This would still be scheduled later. 03/14/2015 -- she has heard back from the vascular office and has surgery scheduled for sometime in July. [name], [name] ([id number]) Her rheumatologist has decreased her prednisone dosage but she still on it. She has also had cataract surgery in her right eye recently this week. 03/20/2015 - No new complaints today. Pain improved. No fever or chills. Tolerating 2 layer compression. 04/14/2015 -- she was doing very well today she went off on vacation and now her edema has increased markedly the ulceration is bigger and her diabetes is not under control. 04/21/2015 -- I spoke to her PCP Dr. Candiss Norse and discussed the management which would include being seen by a general surgeon for debridement and taking multiple punch biopsies which would help in establishing the diagnosis of this is a vasculitis. She is agreeable about this and will set her up for the procedure with Dr. Tamala Julian at [facility]. She was seen by the surgeon Dr. Jamal Collin. 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 She is scheduled to have laser ablation done next week.Pt was sent here for possible biopsy to look for vasculitis. Feel it would be better to wait after laser ablation is completed- the ulcer may heal fully and biopsy may not be necessary 04/29/2015 -- she had the venous ablation done by Dr. Lucky Cowboy last Friday and we do not have any notes yet. She is doing fine otherwise. 05/06/2015 --Review of her recent vascular intervention shows that she was seen by Dr. Lucky Cowboy on 04/29/2015. The follow-up duplex which was done showed that both  the great saphenous vein and the small saphenous vein remained patent with reflux consistent with an unsuccessful ablation. He has rescheduled her for another the endovenous ablation to be done in about 4 weekso time. 05/13/2015 -- he was seen by her surgeon Dr. Jamal Collin who asked her to continue with conservative therapy and he would speak to Dr. Lucky Cowboy about her management. Dr. Lucky Cowboy is going to schedule her surgery in the middle of August for a repeat endovenous ablation. Her pus culture from last week has grown : Gideon her noted her sensitivity report but due to her multiple allergies I had tried clindamycin and she developed a rash with this too. She has been prescribed and anti-buttocks in the ER and is has it at home and she will let is know what she is going to be taking. 05/20/2015 -- she has developed a small spot on her right lower extremity but besides that it is not a full fledged ulceration. She did not get to see Dr. Lucky Cowboy last week and hopefully she will see him in the near future. 05/27/2015 -she is still awaiting her appointment with Dr.Dew and her vascular procedure is not scheduled until August 19. She will be seeing her PCP tomorrow and I have asked her to convey our discussion so that she is aware that debridement has not been done yet. 06/03/2015 -- was seen by her rheumatologist Dr. 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 He is lowering her prednisone to 12-1/2 mg for 1 month and then 10 mg per the next month. I will again make an attempt to speak to her PCP Dr. Candiss Norse and her surgeon Dr. 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 06/17/2015 -- Dr.Dew did her  vascular procedure last week and a follow-up venous ultrasound shows good resolution of the veins as per the patient's history. He is to see her back in 2 weeks. [name], [name] ([id number]) 07/07/2015. -- the patient has had a heavy growth of Proteus mirabilis and Enterococcus faecalis. These are sensitive to several drugs but the problem is she has allergies to all of these and hence I would like her to see Dr. Ola Spurr for this. She is also due to see Dr. Lucky Cowboy tomorrow and I will discussed the management with him including debridement under anesthesia and possible biopsies. 07/14/2015 -- she has an appointment to see Dr. Ola Spurr tomorrow and did see Dr. Bunnie Domino PA who will discuss my request with him. 07/21/2015 -- saw Dr. Ola Spurr was able to do a test on her and has put her on amoxicillin. She has been tolerating that and has had no problems with allergies to this. 07/28/2015 -- Last Friday I spoke to Dr. Leotis Pain regarding her care and he said that her right leg did not need any surgery and on the left leg was doing pretty good. We did agree that if she undergoes any procedure in the future he would do a couple of punch biopsies of the wound. 08/18/2015 -- her right leg is very tender and there is significant amount of slough. The left leg is looking much better 09/02/2015 -- she is going to have a debridement and punch biopsies of her right lower extremity by Dr. Leotis Pain this  coming Thursday. Also seen Dr. Ola Spurr who has continued her on ciprofloxacin. 09/09/2015 -- on 09/04/2015 Dr. Leotis Pain took her to surgery - Irrigation and excisional debridement of skin, soft tissue, and muscle to about 40 cm2 to the right posterior calf with biopsy. Pathology results are -- DIAGNOSIS: SKIN, RIGHT LOWER EXTREMITY; BIOPSY: SKIN AND SOFT TISSUE WITH ULCERATION, SEE NOTE. - NEGATIVE FOR DYSPLASIA AND MALIGNANCY. Note: There is acute inflammation and ulceration of the epidermis. The dermis  shows nonspecific inflammation, neovascularization, and hemosiderin deposition. The differential diagnosis for these findings includes stasis dermatitis, nonspecific dermatitis, and infection. Correlation with clinical findings is required. A PAS fungal stain is obtained and results will be reported in an addendum. cultures were also sent and this grew Pseudomonas aeruginosa, Escherichia coli, Proteus mirabilis, Klebsiella pneumoniae and they were all sensitive to ciprofloxacin which she is on. 09/16/2015 -- he saw Dr. 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 He is going to call the patient in and discuss with her the management possibly with Imuran. I will continue treating her locally. 09/23/2015 -- she saw Dr. Ola Spurr today who was going to continue the antibiotics for now and stop after this course. She has an appointment to see Dr. Jefm Bryant in about a week's time. Her wound VAC has arrived but she did not bring it with her today. We will try and set her up for changes to the right leg 3 times a week. She is here for her first application of Apligraf to the left lower extremity. 09/30/2015 -- she has seen Dr. 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 because she is ambulatory at home health will not apply a wound VAC and she will have to come here 3 times a week on Monday Wednesday and Friday. 10/10/2015 -- she is here for a second application of Apligraf to her left lower extremity. 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 Jefm Bryant. Other than that she is doing well 10/31/2015 -- she is here for a third application of Apligraf to her left lower extremity. after reviewing her wound on the right side I noted  that it is granulating extremely well and we will use the remnants of the Apligraf on the right leg. [name], [name] ([id number]) 11/14/2015 -- she was recently admitted to the hospital between January 11 and 11/10/2015 for uncontrolled hypertension, diabetes mellitus, acute kidney injury. during her admission she was supported by wound care and also by antibiotics. Around this time her immunosuppression was stopped by Dr. Nunzio Cory. She is feeling much better now. 11/21/2015 -- she has had her fourth application of Apligraf today and it has been shared between the left and the right leg 11/24/2015 -- was here for a wound VAC change but it was noticed that she had profuse bleeding from the lobe medial part of her right lower extremity and I was called in to evaluate her. 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 She has been doing fine since then 12/19/2015 -- she has finished 5 applications of Apligraf to the left lower extremity and this is doing great. He began with pain and swelling of her right ankle a couple of  days ago and it has caused her much distress. She does not have any fever or any change in her general health. 12/22/2015 -- the patient's pain and swelling of the right lower extremity has resolved over the weekend. The left lower extremity is completely healed. She has had the first application of Apligraf on the right leg today. Electronic Signature(s) Signed: 12/22/2015 10:46:32 AM By: [name], [name] Entered By: [name] on 12/22/2015 10:46:32 Goldin, FRANKIE LUMBRA ([id number]) -------------------------------------------------------------------------------- Physical Exam Details Patient Name: [name], [name] 12/22/2015 9:15 Date of Service: AM Medical Record [id number] Number: Patient Account Number: [id number] 1945/12/22 (70 y.o. Treating RN: Ahmed Prima Date of  Birth/Sex: Female) Other Clinician: Primary Care Physician: [facility], [name] Treating [name] Referring Physician: Glendon Axe Physician/Extender: Weeks in Treatment: 46 Constitutional . Pulse regular. Respirations normal and unlabored. Afebrile. . Eyes Nonicteric. Reactive to light. Ears, Nose, Mouth, and Throat Lips, teeth, and gums WNL.Marland Kitchen Moist mucosa without lesions. Neck supple and nontender. No palpable supraclavicular or cervical adenopathy. Normal sized without goiter. Respiratory WNL. No retractions.. Cardiovascular Pedal Pulses WNL. No clubbing, cyanosis or edema. Lymphatic No adneopathy. No adenopathy. No adenopathy. Musculoskeletal Adexa without tenderness or enlargement.. Digits and nails w/o clubbing, cyanosis, infection, petechiae, ischemia, or inflammatory conditions.. Integumentary (Hair, Skin) No suspicious lesions. No crepitus or fluctuance. No peri-wound warmth or erythema. No masses.Marland Kitchen Psychiatric Judgement and insight Intact.. No evidence of depression, anxiety, or agitation.. Notes the left lower extremity has completely healed and there is no open ulceration. The medial part of the right lower extremity ulceration has some slough and this will be treated with Santyl. The right posterior lateral area is is clean and with the usual precautions Apligraf was applied to this wound. Electronic Signature(s) Signed: 12/22/2015 10:47:35 AM By: [name], [name] Entered By: [name] on 12/22/2015 10:47:34 Werk, DAWANA CORALES ([id number]) -------------------------------------------------------------------------------- Physician Orders Details Patient Name: [name], [name] 12/22/2015 9:15 Date of Service: AM Medical Record [id number] Number: Patient Account Number: [id number] 03/04/1946 (70 y.o. Treating RN: Ahmed Prima Date of Birth/Sex: Female) Other Clinician: Primary Care Physician: [facility], [name] Treating Monserrath Junio Referring Physician:  Glendon Axe Physician/Extender: Weeks in Treatment: 28 Verbal / Phone Orders: Yes Clinician: Pinkerton, Debi Read Back and Verified: Yes Diagnosis Coding Wound Cleansing Wound #5 Right,Posterior Lower Leg o Cleanse wound with mild soap and water Wound #6 Right,Medial Lower Leg o Cleanse wound with mild soap and water Anesthetic Wound #5 Right,Posterior Lower Leg o Topical Lidocaine 4% cream applied to wound bed prior to debridement Wound #6 Right,Medial Lower Leg o Topical Lidocaine 4% cream applied to wound bed prior to debridement Skin Barriers/Peri-Wound Care Wound #5 Right,Posterior Lower Leg o Skin Prep Wound #6 Right,Medial Lower Leg o Skin Prep Primary Wound Dressing Wound #5 Right,Posterior Lower Leg o Other: - Apligraph o Dry Gauze o Boardered Foam Dressing o Mepitel One - steri-strips Wound #6 Right,Medial Lower Leg o Santyl Ointment o Dry Gauze o Boardered Foam Dressing Dressing Change Frequency Seavey, Reshma D. ([id number]) Wound #6 Right,Medial Lower Leg o Change dressing every other day. Wound #5 Right,Posterior Lower Leg o Change dressing every week Follow-up Appointments Wound #5 Right,Posterior Lower Leg o Return Appointment in 1 week. Wound #6 Right,Medial Lower Leg o Return Appointment in 1 week. Edema Control Wound #5 Right,Posterior Lower Leg o Elevate legs to the level of the heart and pump ankles as often as possible o Other: - wear compression hose Wound #6 Right,Medial Lower Leg o Elevate legs to the  level of the heart and pump ankles as often as possible o Other: - wear compression hose Electronic Signature(s) Signed: 12/22/2015 4:45:18 PM By: [name], [name] Signed: 12/22/2015 4:52:05 PM By: Alric Quan Entered By: Alric Quan on 12/22/2015 10:15:44 Macgregor, Keyuna D. ([id number]) -------------------------------------------------------------------------------- Problem List  Details Patient Name: IVERY, JARDON D. 12/22/2015 9:15 Date of Service: AM Medical Record [id number] Number: Patient Account Number: [id number] [birth year] (70 y.o. Treating RN: Ahmed Prima Date of Birth/Sex: Female) Other Clinician: Primary Care Physician: [facility], Delana Meyer Treating [name] Referring Physician: Glendon Axe Physician/Extender: Weeks in Treatment: 30 Active Problems ICD-10 Encounter Code Description Active Date Diagnosis E11.622 Type 2 diabetes mellitus with other skin ulcer 01/31/2015 Yes E66.01 Morbid (severe) obesity due to excess calories 01/31/2015 Yes I89.0 Lymphedema, not elsewhere classified 01/31/2015 Yes L97.212 Non-pressure chronic ulcer of right calf with fat layer 11/24/2015 Yes exposed I87.311 Chronic venous hypertension (idiopathic) with ulcer of 11/24/2015 Yes right lower extremity Inactive Problems Resolved Problems ICD-10 Code Description Active Date Resolved Date I83.222 Varicose veins of left lower extremity with both ulcer of 03/07/2015 03/07/2015 calf and inflammation I83.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 with both ulcer of 03/07/2015 03/07/2015 ankle and inflammation L97.322 01/31/2015 01/31/2015 [name], [name] ([id number]) Non-pressure chronic ulcer of left ankle with fat layer exposed L03.116 Cellulitis of left lower limb 07/07/2015 07/07/2015 Electronic Signature(s) Signed: 12/22/2015 10:44:58 AM By: [name], [name] Previous Signature: 12/22/2015 10:44:18 AM Version By: [name], [name] Entered By: [name] on 12/22/2015 10:44:57 [name], [name] Kitchen ([id number]) -------------------------------------------------------------------------------- Progress Note Details Patient Name: JEWELIE, BOYSTER D. 12/22/2015 9:15 Date of Service: AM Medical Record [id number] Number: Patient Account Number: [id number] 25-Aug-1946 (70 y.o. Treating RN: Ahmed Prima Date of Birth/Sex: Female) Other Clinician: Primary Care Physician:  [facility], Delana Meyer Treating [name] Referring Physician: Glendon Axe Physician/Extender: Weeks in Treatment: 19 Subjective Chief Complaint Information obtained from Patient Patient presents to the wound care center for a consult due non healing wound 70 year old patient comes with a history of having a ulcer on the left lower extremity for the past 4 weeks. she says she's had swelling of both lower extremities for about a year after she started having prednisone. 02/07/2015 -- her vascular appointments obtained were in the first and third week of June. she is able to go to [place] and we will try and get her some earlier appointments. Other than that nothing else has changed in her management. History of Present Illness (HPI) 70 year old patient who is known to have diabetes mellitus type 2, chronic renal insufficiency, coronary artery disease, hypertension, hypercholesterolemia, temporal arteritis and inflammatory arthritiss also has a history of having a hysterectomy and some orthopedic related surgeries. The ulcer on the left lower extremity started off as a blister and then. Got progressively worse. She does not have any fever or chills and has not had any recent surgical intervention for this. Her last hemoglobin A1c was 10.1 in September 2015. She has been recently put on doxycycline by her PCP. She is now also allergic to doxycycline and was this was changed over to Keflex. 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 She does see a cardiologist and also takes a diuretic. 02/07/2015 her arterial and venous duplex studies to be done have dates been given as the first and third week of June. This is at [facility]. We are going to try  and get early appointments at [facility]. other than that nothing has changed in her management. 02/14/2015 -- we have been able to get  her an appointment in [facility] on May 20 which is much earlier than her previous ones at [place]. She continues with her prednisone and her sugars are in the range of 150-200. 02/21/2015 We were able to get a vascular lab workup for her today and she is going to be there at 2:00 this afternoon. the swelling of her leg has gone down significantly but she still has some tenderness over the wounds. 02/28/2015 - She has had one of two vascular workups done, and this coming Tuesday has another, at [facility]. She continues to be on steroid medications. She has significant sensitivity in her left lower extremity and has pain suggestive of neuropathic pain and I have asked her to address this with her primary care physician. 03/07/2015 -- The patient saw Dr. Lucky Cowboy for a consultation and he has had her arteries are okay but she has 2 incompetent veins on the left lower extremity and he is going to set her up for surgery. Official report is TAQUASIA, SAWICKI ([id number]) awaited. Addendum: Official reports are now available and on 03/04/2015. She was seen and lower extremity venous duplex exam was done. There was reflux present within the left greater saphenous vein below the knee and also the left small saphenous vein. Arterial duplex showed normal triphasic waveforms throughout the left lower extremity without any significant stenosis. Her ABIs were noncompressible bilaterally but a waveforms were normal and a digital pressures were normal bilaterally consistent with no significant arterial insufficiency. He has recommended endovenous ablation of both the left small saphenous and the left great saphenous vein. This would still be scheduled later. 03/14/2015 -- she has heard back from the vascular office and has surgery scheduled for sometime in July. Her rheumatologist has decreased her prednisone dosage but she still on it. She has also had cataract surgery in her right eye  recently this week. 03/20/2015 - No new complaints today. Pain improved. No fever or chills. Tolerating 2 layer compression. 04/14/2015 -- she was doing very well today she went off on vacation and now her edema has increased markedly the ulceration is bigger and her diabetes is not under control. 04/21/2015 -- I spoke to her PCP Dr. Candiss Norse and discussed the management which would include being seen by a general surgeon for debridement and taking multiple punch biopsies which would help in establishing the diagnosis of this is a vasculitis. She is agreeable about this and will set her up for the procedure with Dr. Tamala Julian at [facility]. She was seen by the surgeon Dr. Jamal Collin. 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 She is scheduled to have laser ablation done next week.Pt was sent here for possible biopsy to look for vasculitis. Feel it would be better to wait after laser ablation is completed- the ulcer may heal fully and biopsy may not be necessary 04/29/2015 -- she had the venous ablation done by Dr. Lucky Cowboy last Friday and we do not have any notes yet. She is doing fine otherwise. 05/06/2015 --Review of her recent vascular intervention shows that she was seen by Dr. Lucky Cowboy on 04/29/2015. The follow-up duplex which was done showed that both the great saphenous vein and the small saphenous vein remained patent with reflux consistent with an unsuccessful ablation. He  has rescheduled her for another the endovenous ablation to be done in about 4 weeks time. 05/13/2015 -- he was seen by her surgeon Dr. Jamal Collin who asked her to continue with conservative therapy and he would speak to Dr. Lucky Cowboy about her management. Dr. Lucky Cowboy is going to schedule her surgery in the middle of August for a repeat endovenous ablation. Her pus culture from last week has grown : [place] her noted her sensitivity report but due to her multiple allergies I had tried clindamycin and she developed a rash with this too. She has been prescribed and anti-buttocks in the ER and is has it at home and she will let is know what she is going to be taking. 05/20/2015 -- she has developed a small spot on her right lower extremity but besides that it is not a full fledged ulceration. She did not get to see Dr. Lucky Cowboy last week and hopefully she will see him in the near future. 05/27/2015 -she is still awaiting her appointment with Dr.Dew and her vascular procedure is not scheduled until August 19. She will be seeing her PCP tomorrow and I have asked her to convey our discussion so that she is aware that debridement has not been done yet. KEYARIA, PITTSER ([id number]) 06/03/2015 -- was seen by her rheumatologist Dr. 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 He is lowering her prednisone to 12-1/2 mg for 1 month and then 10 mg per the next month. I will again make an attempt to speak to her PCP Dr. Candiss Norse and her surgeon Dr. 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 06/17/2015 -- Dr.Dew did her vascular procedure last week and a follow-up venous ultrasound shows good resolution of the veins as per the patient's history. He is to see her back in 2 weeks. 07/07/2015. -- the patient has had a heavy growth of Proteus mirabilis and Enterococcus faecalis. These are sensitive to several drugs but the problem is she has allergies to all of these and hence I would like her to see Dr. Ola Spurr for this. She is also due to see Dr. Lucky Cowboy tomorrow and I will discussed the management with him including debridement  under anesthesia and possible biopsies. 07/14/2015 -- she has an appointment to see Dr. Ola Spurr tomorrow and did see Dr. Bunnie Domino PA who will discuss my request with him. 07/21/2015 -- saw Dr. Ola Spurr was able to do a test on her and has put her on amoxicillin. She has been tolerating that and has had no problems with allergies to this. 07/28/2015 -- Last Friday I spoke to Dr. Leotis Pain regarding her care and he said that her right leg did not need any surgery and on the left leg was doing pretty good. We did agree that if she undergoes any procedure in the future he would do a couple of punch biopsies of the wound. 08/18/2015 -- her right leg is very tender and there is significant amount of slough. The left leg is looking much better 09/02/2015 -- she is going to have a debridement and punch biopsies of her right lower extremity by Dr. Leotis Pain this coming Thursday. Also seen Dr. Ola Spurr who has continued her on ciprofloxacin. 09/09/2015 -- on 09/04/2015 Dr. Corene Cornea  Dew took her to surgery - Irrigation and excisional debridement of skin, soft tissue, and muscle to about 40 cm2 to the right posterior calf with biopsy. Pathology results are -- DIAGNOSIS: SKIN, RIGHT LOWER EXTREMITY; BIOPSY: SKIN AND SOFT TISSUE WITH ULCERATION, SEE NOTE. - NEGATIVE FOR DYSPLASIA AND MALIGNANCY. Note: There is acute inflammation and ulceration of the epidermis. The dermis shows nonspecific inflammation, neovascularization, and hemosiderin deposition. The differential diagnosis for these findings includes stasis dermatitis, nonspecific dermatitis, and infection. Correlation with clinical findings is required. A PAS fungal stain is obtained and results will be reported in an addendum. cultures were also sent and this grew Pseudomonas aeruginosa, Escherichia coli, Proteus mirabilis, Klebsiella pneumoniae and they were all sensitive to ciprofloxacin which she is on. 09/16/2015 -- he saw Dr. 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 He is going to call the patient in and discuss with her the management possibly with Imuran. I will continue treating her locally. 09/23/2015 -- she saw Dr. Ola Spurr today who was going to continue the antibiotics for now and stop after this course. She has an appointment to see Dr. Jefm Bryant in about a week's time. Her wound VAC has arrived but she did not bring it with her today. We will try and set her up for changes to the right leg 3 times a week. She is here for her first application of Apligraf to the left lower extremity. SIRIYAH, MACLAREN ([id number]) 09/30/2015 -- she has seen Dr. 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 because she is ambulatory at home health will not apply a wound VAC and she will have to come here 3 times a week on Monday Wednesday and Friday. 10/10/2015 -- she is here for a second application of Apligraf to her left lower extremity. 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 Jefm Bryant. Other than that she is doing well 10/31/2015 -- she is here for a third application of Apligraf to her left lower extremity. after reviewing her wound on the right side I noted that it is granulating extremely well and we will use the remnants of the Apligraf on the right leg. 11/14/2015 -- she was recently admitted to the hospital between January 11 and 11/10/2015 for uncontrolled hypertension, diabetes mellitus, acute kidney injury. during her admission she was supported by wound care and also by antibiotics. Around this time her immunosuppression was stopped by Dr. Nunzio Cory. She is feeling much better now. 11/21/2015 -- she has had her fourth application of Apligraf today and it  has been shared between the left and the right leg 11/24/2015 -- was here for a wound VAC change but it was noticed that she had profuse bleeding from the lobe medial part of her right lower extremity and I was called in to evaluate her. 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 She has been doing fine since then 12/19/2015 -- she has finished 5 applications of Apligraf to the left lower extremity and this is doing great. He began with pain and swelling of her right ankle a couple of days ago and it has caused her much distress. She does not have any fever or any change  in her general health. 12/22/2015 -- the patient's pain and swelling of the right lower extremity has resolved over the weekend. The left lower extremity is completely healed. She has had the first application of Apligraf on the right leg today. Objective Constitutional Pulse regular. Respirations normal and unlabored. Afebrile. Vitals Time Taken: 9:31 AM, Height: 65 in, Weight: 248 lbs, BMI: 41.3, Temperature: 97.6 F, Pulse: 68 bpm, Respiratory Rate: 20 breaths/min, Blood Pressure: 153/52 mmHg. Eyes Nonicteric. Reactive to light. Ears, Nose, Mouth, and Throat Lips, teeth, and gums WNL.Marland Kitchen Moist mucosa without lesions. Neck Teska, Letricia D. ([id number]) supple and nontender. No palpable supraclavicular or cervical adenopathy. Normal sized without goiter. Respiratory WNL. No retractions.. Cardiovascular Pedal Pulses WNL. No clubbing, cyanosis or edema. Lymphatic No adneopathy. No adenopathy. No adenopathy. Musculoskeletal Adexa without tenderness or enlargement.. Digits and nails w/o clubbing, cyanosis, infection, petechiae, ischemia, or inflammatory conditions.Marland Kitchen Psychiatric Judgement and insight Intact.. No evidence of depression, anxiety, or agitation.. General Notes: the left lower extremity has completely healed and there  is no open ulceration. The medial part of the right lower extremity ulceration has some slough and this will be treated with Santyl. The right posterior lateral area is is clean and with the usual precautions Apligraf was applied to this wound. Integumentary (Hair, Skin) No suspicious lesions. No crepitus or fluctuance. No peri-wound warmth or erythema. No masses.. Wound #2 status is Open. Original cause of wound was Blister. The wound is located on the Left,Distal Lower Leg. The wound measures 0cm length x 0cm width x 0cm depth; 0cm^2 area and 0cm^3 volume. The wound is limited to skin breakdown. There is no tunneling or undermining noted. There is a small amount of serous drainage noted. The wound margin is flat and intact. There is no granulation within the wound bed. There is no necrotic tissue within the wound bed. The periwound skin appearance did not exhibit: Callus, Crepitus, Excoriation, Fluctuance, Friable, Induration, Localized Edema, Rash, Scarring, Dry/Scaly, Maceration, Moist, Atrophie Blanche, Cyanosis, Ecchymosis, Hemosiderin Staining, Mottled, Pallor, Rubor, Erythema. Wound #5 status is Open. Original cause of wound was Gradually Appeared. The wound is located on the Right,Posterior Lower Leg. The wound measures 5.2cm length x 7.3cm width x 0.1cm depth; 29.814cm^2 area and 2.981cm^3 volume. The wound is limited to skin breakdown. There is no tunneling or undermining noted. There is a large amount of serous drainage noted. The wound margin is flat and intact. There is large (67-100%) red, pink granulation within the wound bed. There is a small (1-33%) amount of necrotic tissue within the wound bed including Adherent Slough. The periwound skin appearance exhibited: Moist. The periwound skin appearance did not exhibit: Callus, Crepitus, Excoriation, Fluctuance, Friable, Induration, Localized Edema, Rash, Scarring, Dry/Scaly, Maceration, Atrophie Blanche, Cyanosis,  Ecchymosis, Hemosiderin Staining, Mottled, Pallor, Rubor, Erythema. The periwound has tenderness on palpation. Wound #6 status is Open. Original cause of wound was Gradually Appeared. The wound is located on the Right,Medial Lower Leg. The wound measures 6.1cm length x 4.5cm width x 0.2cm depth; 21.559cm^2 area and 4.312cm^3 volume. The wound is limited to skin breakdown. There is no tunneling or undermining noted. There is a medium amount of purulent drainage noted. The wound margin is distinct with the outline attached to the wound base. There is small (1-33%) red granulation within the wound bed. There is a large (67-100%) amount of necrotic tissue within the wound bed including Eschar and Adherent Slough. The Massie, Jezreel D. ([id number]) periwound skin appearance exhibited: Moist. The periwound skin appearance did  not exhibit: Callus, Crepitus, Excoriation, Fluctuance, Friable, Induration, Localized Edema, Rash, Scarring, Dry/Scaly, Maceration, Atrophie Blanche, Cyanosis, Ecchymosis, Hemosiderin Staining, Mottled, Pallor, Rubor, Erythema. Periwound temperature was noted as No Abnormality. The periwound has tenderness on palpation. 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 To the right lower extremity the posterior lateral wound has had the first application of Apligraf and she will be in for a wound check next week. the dressing on this part of the wound will be left alone for the week The medial wound will be  treated with Santyl ointment locally and she will continue to use her compression stockings as before. all questions answered and she understands the treatment plan Procedures Wound #5 Wound #5 is a Venous Leg Ulcer located on the Right,Posterior Lower Leg. A skin graft procedure using a bioengineered skin substitute/cellular or tissue based product was performed by [name], MD. Apligraf was applied and secured with Steri-Strips. 38 sq cm of product was utilized and 6 sq cm was wasted due to [place]. Post Application, mepitel was applied. A Time Out was conducted prior to the start of the procedure. The procedure was tolerated well with a pain level of 0 throughout and a pain level of 0 following the procedure. Post procedure Diagnosis Wound #5: Same as Pre-Procedure . RADIA, FALSO ([id number]) Plan Wound Cleansing: Wound #5 Right,Posterior Lower Leg: Cleanse wound with mild soap and water Wound #6 Right,Medial Lower Leg: Cleanse wound with mild soap and water Anesthetic: Wound #5 Right,Posterior Lower Leg: Topical Lidocaine 4% cream applied to wound bed prior to debridement Wound #6 Right,Medial Lower Leg: Topical Lidocaine 4% cream applied to wound bed prior to debridement Skin Barriers/Peri-Wound Care: Wound #5 Right,Posterior Lower Leg: Skin Prep Wound #6 Right,Medial Lower Leg: Skin Prep Primary Wound Dressing: Wound #5 Right,Posterior Lower Leg: Other: - Apligraph Dry Gauze Boardered Foam Dressing Mepitel One - steri-strips Wound #6 Right,Medial Lower Leg: Santyl Ointment Dry Gauze Boardered Foam Dressing Dressing Change Frequency: Wound #6 Right,Medial Lower Leg: Change dressing every other day. Wound #5 Right,Posterior Lower Leg: Change dressing every week Follow-up Appointments: Wound #5 Right,Posterior Lower Leg: Return Appointment in 1 week. Wound #6 Right,Medial Lower Leg: Return Appointment in 1 week. Edema Control: Wound #5 Right,Posterior  Lower Leg: Elevate legs to the level of the heart and pump ankles as often as possible Other: - wear compression hose Wound #6 Right,Medial Lower Leg: Elevate legs to the level of the heart and pump ankles as often as possible Other: - wear compression hose January, Affie D. ([id number]) having completely healed out her left lower extremity wound I have recommended she continues applying a bordered foam over the fresh scar and using her compression stocking. To the right lower extremity the posterior lateral wound has had the first application of Apligraf and she will be in for a wound check next week. the dressing on this part of the wound will be left alone for the week The medial wound will be treated with Santyl ointment locally and she will continue to use her compression stockings as before.  all questions answered and she understands the treatment plan Electronic Signature(s) Signed: 12/22/2015 10:48:58 AM By: [name], [name] Entered By: [name] on 12/22/2015 10:48:57 Vandagriff, Makynleigh D. ([id number]) -------------------------------------------------------------------------------- SuperBill Details Patient Name: [name] Date of Service: 12/22/2015 Medical Record Number: [id number] Patient Account Number: [id number] Date of Birth/Sex: 1946/05/25 (70 y.o. Female) Treating RN: Carolyne Fiscal, Debi Primary Care Physician: Glendon Axe Other Clinician: Referring Physician: Glendon Axe Treating Physician/Extender: Frann Rider in Treatment: 46 Diagnosis Coding ICD-10 Codes Code Description E11.622 Type 2 diabetes mellitus with other skin ulcer E66.01 Morbid (severe) obesity due to excess calories I89.0 Lymphedema, not elsewhere classified L97.212 Non-pressure chronic ulcer of right calf with fat layer exposed I87.311 Chronic venous hypertension (idiopathic) with ulcer of right lower extremity Facility Procedures CPT4 Code Description: [id number] (Facility Use  Only) Apligraf 1 SQ CM Modifier: Quantity: 82 CPT4 Code Description: [id number] - SKIN SUB GRAFT TRNK/ARM/LEG ICD-10 Description Diagnosis E11.622 Type 2 diabetes mellitus with other skin ulcer I87.311 Chronic venous hypertension (idiopathic) with ulcer I89.0 Lymphedema, not elsewhere classified  L97.212 Non-pressure chronic ulcer of right calf with fat la Modifier: of right lowe yer exposed Quantity: 1 r extremity CPT4 Code Description: [id number] - SKIN SUB GRAFT T/A/L ADD-ON ICD-10 Description Diagnosis E11.622 Type 2 diabetes mellitus with other skin ulcer I89.0 Lymphedema, not elsewhere classified L97.212 Non-pressure chronic ulcer of right calf with fat  la I87.311 Chronic venous hypertension (idiopathic) with ulcer Modifier: yer exposed of right lowe Quantity: 1 r extremity Physician Procedures CPT4 Code Description: [id number] - WC PHYS SKIN SUB GRAFT TRNK/ARM/LEG ICD-10 Description Diagnosis E11.622 Type 2 diabetes mellitus with other skin ulcer Vandunk, Kaelah D. ([id number]) Modifier: Quantity: 1 Electronic Signature(s) Signed: 12/22/2015 10:49:21 AM By: [name], [name] Entered By: [name] on 12/22/2015 10:49:21

## 2015-12-23 NOTE — Progress Notes (Signed)
ANAE, STEENSON ([id number]) Visit Report for 12/22/2015 Arrival Information Details Patient Name: [name], [name] Date of Service: 12/22/2015 9:15 AM Medical Record Number: [id number] Patient Account Number: [id number] Date of Birth/Sex: [birth year] (70 y.o. Female) Treating RN: Carolyne Fiscal, Debi Primary Care Physician: Glendon Axe Other Clinician: Referring Physician: Glendon Axe Treating Physician/Extender: Frann Rider in Treatment: 28 Visit Information History Since Last Visit All ordered tests and consults were completed: No Patient Arrived: Cane Added or deleted any medications: No Arrival Time: 09:30 Any new allergies or adverse reactions: No Accompanied By: self Had a fall or experienced change in No Transfer Assistance: None activities of daily living that may affect Patient Identification Verified: Yes risk of falls: Patient Requires Transmission- No Signs or symptoms of abuse/neglect since last No Based Precautions: visito Patient Has Alerts: Yes Hospitalized since last visit: No Patient Alerts: Patient on Blood Pain Present Now: No Thinner Electronic Signature(s) Signed: 12/22/2015 4:52:05 PM By: Alric Quan Entered By: Alric Quan on 12/22/2015 09:30:40 Hoffart, Ryen D. ([id number]) -------------------------------------------------------------------------------- Encounter Discharge Information Details Patient Name: [name], [name] Date of Service: 12/22/2015 9:15 AM Medical Record Number: [id number] Patient Account Number: [id number] Date of Birth/Sex: 02-05-1946 (70 y.o. Female) Treating RN: Carolyne Fiscal, Debi Primary Care Physician: Glendon Axe Other Clinician: Referring Physician: Glendon Axe Treating Physician/Extender: Frann Rider in Treatment: 96 Encounter Discharge Information Items Discharge Pain Level: 0 Discharge Condition: Stable Ambulatory Status: Cane Discharge Destination: Home Transportation: Private  Auto Accompanied By: granddaughter Schedule Follow-up Appointment: Yes Medication Reconciliation completed and provided to Yes Patient/Care [name]: Provided on Clinical Summary of Care: 12/22/2015 Form Type Recipient Paper Patient LM Electronic Signature(s) Signed: 12/22/2015 10:22:48 AM By: Ruthine Dose Entered By: Ruthine Dose on 12/22/2015 10:22:48 [name], [name] ([id number]) -------------------------------------------------------------------------------- Lower Extremity Assessment Details Patient Name: [name], [name] Date of Service: 12/22/2015 9:15 AM Medical Record Number: [id number] Patient Account Number: [id number] Date of Birth/Sex: 1946/06/18 (70 y.o. Female) Treating RN: Carolyne Fiscal, Debi Primary Care Physician: Glendon Axe Other Clinician: Referring Physician: Glendon Axe Treating Physician/Extender: Frann Rider in Treatment: 46 Edema Assessment Assessed: [Left: No] [Right: No] E[Left: dema] [Right: :] Calf Left: Right: Point of Measurement: 30 cm From Medial Instep cm cm Ankle Left: Right: Point of Measurement: 9 cm From Medial Instep cm cm Electronic Signature(s) Signed: 12/22/2015 4:52:05 PM By: Alric Quan Entered By: Alric Quan on 12/22/2015 09:41:21 [name], [name] ([id number]) -------------------------------------------------------------------------------- Multi Wound Chart Details Patient Name: Kristin Stare D. Date of Service: 12/22/2015 9:15 AM Medical Record Number: [id number] Patient Account Number: [id number] Date of Birth/Sex: [birth year] (70 y.o. Female) Treating RN: Carolyne Fiscal, Debi Primary Care Physician: Glendon Axe Other Clinician: Referring Physician: Glendon Axe Treating Physician/Extender: Frann Rider in Treatment: 46 Vital Signs Height(in): 65 Pulse(bpm): 68 Weight(lbs): 248 Blood Pressure 153/52 (mmHg): Body Mass Index(BMI): 41 Temperature(F): 97.6 Respiratory  Rate 20 (breaths/min): Photos: [2:No Photos] [5:No Photos] [6:No Photos] Wound Location: [2:Left Lower Leg - Distal] [5:Right Lower Leg - Posterior] [6:Right Lower Leg - Medial] Wounding Event: [2:Blister] [5:Gradually Appeared] [6:Gradually Appeared] Primary Etiology: [2:Venous Leg Ulcer] [5:Venous Leg Ulcer] [6:Diabetic Wound/Ulcer of the Lower Extremity] Comorbid History: [2:Cataracts, Asthma, Coronary Artery Disease, Coronary Artery Disease, Coronary Artery Disease, Hypertension, Type II Diabetes, Osteoarthritis, Diabetes, Osteoarthritis, Diabetes, Osteoarthritis, Neuropathy] [5:Cataracts, Asthma,  Hypertension, Type II Neuropathy] [6:Cataracts, Asthma, Hypertension, Type II Neuropathy] Date Acquired: [2:12/30/2014] [5:04/29/2015] [6:10/10/2015] Weeks of Treatment: [2:46] [5:30] [6:10] Wound Status: [2:Open] [5:Open] [6:Open] Clustered Wound: [2:Yes] [5:No] [6:No] Measurements L x W x D 0x0x0 [5:5.2x7.3x0.1] [6:6.1x4.5x0.2] (cm) Area (  cm) : [2:0] [5:29.814] [6:21.559] Volume (cm) : [2:0] [5:2.981] [6:4.312] % Reduction in Area: [2:100.00%] [5:-15758.50%] [6:-523.80%] % Reduction in Volume: 100.00% [5:-15589.50%] [6:-1146.20%] Classification: [2:Full Thickness Without Exposed Support Structures] [5:Full Thickness Without Exposed Support Structures] [6:Grade 1] HBO Classification: [2:Grade 1] [5:Grade 1] [6:N/A] Exudate Amount: [2:Small] [5:Large] [6:Medium] Exudate Type: [2:Serous] [5:Serous] [6:Purulent] Exudate Color: [2:amber] [5:amber] [6:yellow, brown, green] Wound Margin: [2:Flat and Intact] [5:Flat and Intact] [6:Distinct, outline attached] Granulation Amount: [2:None Present (0%)] [5:Large (67-100%)] [6:Small (1-33%)] Granulation Quality: N/A Red, Pink, Hyper- Red granulation Necrotic Amount: None Present (0%) Small (1-33%) Large (67-100%) Necrotic Tissue: N/A Adherent Slough Eschar, Adherent Slough Exposed Structures: Fascia: No Fascia: No Fascia: No Fat: No Fat:  No Fat: No Tendon: No Tendon: No Tendon: No Muscle: No Muscle: No Muscle: No Joint: No Joint: No Joint: No Bone: No Bone: No Bone: No Limited to Skin Limited to Skin Limited to Skin Breakdown Breakdown Breakdown Epithelialization: Large (67-100%) Small (1-33%) None Periwound Skin Texture: Edema: No Edema: No Edema: No Excoriation: No Excoriation: No Excoriation: No Induration: No Induration: No Induration: No Callus: No Callus: No Callus: No Crepitus: No Crepitus: No Crepitus: No Fluctuance: No Fluctuance: No Fluctuance: No Friable: No Friable: No Friable: No Rash: No Rash: No Rash: No Scarring: No Scarring: No Scarring: No Periwound Skin Maceration: No Moist: Yes Moist: Yes Moisture: Moist: No Maceration: No Maceration: No Dry/Scaly: No Dry/Scaly: No Dry/Scaly: No Periwound Skin Color: Atrophie Blanche: No Atrophie Blanche: No Atrophie Blanche: No Cyanosis: No Cyanosis: No Cyanosis: No Ecchymosis: No Ecchymosis: No Ecchymosis: No Erythema: No Erythema: No Erythema: No Hemosiderin Staining: No Hemosiderin Staining: No Hemosiderin Staining: No Mottled: No Mottled: No Mottled: No Pallor: No Pallor: No Pallor: No Rubor: No Rubor: No Rubor: No Temperature: N/A N/A No Abnormality Tenderness on No Yes Yes Palpation: Wound Preparation: Ulcer Cleansing: Ulcer Cleansing: Ulcer Cleansing: Rinsed/Irrigated with Rinsed/Irrigated with Rinsed/Irrigated with Saline Saline Saline Topical Anesthetic Topical Anesthetic Topical Anesthetic Applied: None Applied: Other: lidocaine Applied: Other: lidocaine 4% 4% Treatment Notes Electronic Signature(s) Signed: 12/22/2015 4:52:05 PM By: Alric Quan Entered By: Alric Quan on 12/22/2015 10:02:54 [name], [name] ([id number]) [name], Marcus. ([id number]) -------------------------------------------------------------------------------- [place] Details Patient Name: SELMA, LANIUS D. Date of Service: 12/22/2015 9:15 AM Medical Record Number: [id number] Patient Account Number: [id number] Date of Birth/Sex: [birth year] (70 y.o. Female) Treating RN: Carolyne Fiscal, Debi Primary Care Physician: Glendon Axe Other Clinician: Referring Physician: Glendon Axe Treating Physician/Extender: Frann Rider in Treatment: 46 Active Inactive Orientation to the Wound Care Program Nursing Diagnoses: Knowledge deficit related to the wound healing center program Goals: Patient/caregiver will verbalize understanding of the [place] Program Date Initiated: 01/31/2015 Goal Status: Active Interventions: Provide education on orientation to the wound center Notes: Venous Leg Ulcer Nursing Diagnoses: Potential for venous Insuffiency (use before diagnosis confirmed) Goals: Non-invasive venous studies are completed as ordered Date Initiated: 01/31/2015 Goal Status: Active Patient/caregiver will verbalize understanding of disease process and disease management Date Initiated: 01/31/2015 Goal Status: Active Interventions: Assess peripheral edema status every visit. Notes: Wound/Skin Impairment Nursing Diagnoses: Impaired tissue integrity Knowledge deficit related to smoking impact on wound healing Montel, Xiomara D. ([id number]) Goals: Patient/caregiver will verbalize understanding of skin care regimen Date Initiated: 01/31/2015 Goal Status: Active Ulcer/skin breakdown will heal within 14 weeks Date Initiated: 01/31/2015 Goal Status: Active Interventions: Assess ulceration(s) every visit Notes: Electronic Signature(s) Signed: 12/22/2015 4:52:05 PM By: Alric Quan Entered By: Alric Quan on 12/22/2015 10:02:44 Scardino, Tamu D. ([id number]) -------------------------------------------------------------------------------- Pain Assessment  Details Patient Name: AUBREA, DELUCCIA. Date of Service: 12/22/2015 9:15 AM Medical Record Number: [id number] Patient  Account Number: [id number] Date of Birth/Sex: [birth year] (70 y.o. Female) Treating RN: Carolyne Fiscal, Debi Primary Care Physician: Glendon Axe Other Clinician: Referring Physician: Glendon Axe Treating Physician/Extender: Frann Rider in Treatment: 78 Active Problems Location of Pain Severity and Description of Pain Patient Has Paino Yes Site Locations Pain Location: Pain in Ulcers With Dressing Change: Yes Duration of the Pain. Constant / Intermittento Constant Rate the pain. Current Pain Level: 4 Worst Pain Level: 10 Least Pain Level: 2 Character of Pain Describe the Pain: Aching Pain Management and Medication Current Pain Management: Electronic Signature(s) Signed: 12/22/2015 4:52:05 PM By: Alric Quan Entered By: Alric Quan on 12/22/2015 09:31:33 [name], [name] ([id number]) -------------------------------------------------------------------------------- Patient/Caregiver Education Details Patient Name: Kristin Stare D. Date of Service: 12/22/2015 9:15 AM Medical Record Number: [id number] Patient Account Number: [id number] Date of Birth/Gender: April 05, 1946 (70 y.o. Female) Treating RN: Carolyne Fiscal, Debi Primary Care Physician: Glendon Axe Other Clinician: Referring Physician: Glendon Axe Treating Physician/Extender: Frann Rider in Treatment: 81 Education Assessment Education Provided To: Patient Education Topics Provided Wound/Skin Impairment: Handouts: Other: change dressing as ordered Methods: Demonstration, Explain/Verbal Responses: State content correctly Electronic Signature(s) Signed: 12/22/2015 4:52:05 PM By: Alric Quan Entered By: Alric Quan on 12/22/2015 10:20:38 [name], [name] ([id number]) -------------------------------------------------------------------------------- Wound Assessment Details Patient Name: [name], [name] Date of Service: 12/22/2015 9:15 AM Medical Record Number: [id number] Patient Account  Number: [id number] Date of Birth/Sex: 1946/04/02 (70 y.o. Female) Treating RN: Carolyne Fiscal, Debi Primary Care Physician: Glendon Axe Other Clinician: Referring Physician: Glendon Axe Treating Physician/Extender: Frann Rider in Treatment: 65 Wound Status Wound Number: 2 Primary Venous Leg Ulcer Etiology: Wound Location: Left Lower Leg - Distal Wound Open Wounding Event: Blister Status: Date Acquired: 12/30/2014 Comorbid Cataracts, Asthma, Coronary Artery Weeks Of Treatment: 46 History: Disease, Hypertension, Type II Clustered Wound: Yes Diabetes, Osteoarthritis, Neuropathy Photos Photo Uploaded By: Alric Quan on 12/22/2015 12:21:58 Wound Measurements Length: (cm) 0 % Reduction i Width: (cm) 0 % Reduction i Depth: (cm) 0 Epithelializa Area: (cm) 0 Tunneling: Volume: (cm) 0 Undermining: n Area: 100% n Volume: 100% tion: Large (67-100%) No No Wound Description Full Thickness Without Classification: Exposed Support Structures Diabetic Severity Grade 1 (Wagner): Wound Margin: Flat and Intact Exudate Amount: Small Exudate Type: Serous Exudate Color: amber Foul Odor After Cleansing: No Wound Bed Granulation Amount: None Present (0%) Exposed Structure [name], [name] ([id number]) Necrotic Amount: None Present (0%) Fascia Exposed: No Fat Layer Exposed: No Tendon Exposed: No Muscle Exposed: No Joint Exposed: No Bone Exposed: No Limited to Skin Breakdown Periwound Skin Texture Texture Color No Abnormalities Noted: No No Abnormalities Noted: No Callus: No Atrophie Blanche: No Crepitus: No Cyanosis: No Excoriation: No Ecchymosis: No Fluctuance: No Erythema: No Friable: No Hemosiderin Staining: No Induration: No Mottled: No Localized Edema: No Pallor: No Rash: No Rubor: No Scarring: No Moisture No Abnormalities Noted: No Dry / Scaly: No Maceration: No Moist: No Wound Preparation Ulcer Cleansing: Rinsed/Irrigated with  Saline Topical Anesthetic Applied: None Electronic Signature(s) Signed: 12/22/2015 4:52:05 PM By: Alric Quan Entered By: Alric Quan on 12/22/2015 10:01:27 [name], [name] ([id number]) -------------------------------------------------------------------------------- Wound Assessment Details Patient Name: [name], [name] D. Date of Service: 12/22/2015 9:15 AM Medical Record Number: [id number] Patient Account Number: [id number] Date of Birth/Sex: 1946-09-26 (70 y.o. Female) Treating RN: Carolyne Fiscal, Debi Primary Care Physician: Glendon Axe Other Clinician: Referring Physician: Glendon Axe Treating Physician/Extender: Frann Rider in Treatment: 8 Wound Status  Wound Number: 5 Primary Venous Leg Ulcer Etiology: Wound Location: Right Lower Leg - Posterior Wound Open Wounding Event: Gradually Appeared Status: Date Acquired: 04/29/2015 Comorbid Cataracts, Asthma, Coronary Artery Weeks Of Treatment: 30 History: Disease, Hypertension, Type II Clustered Wound: No Diabetes, Osteoarthritis, Neuropathy Photos Photo Uploaded By: Alric Quan on 12/22/2015 12:22:07 Wound Measurements Length: (cm) 5.2 % Reduction in Width: (cm) 7.3 % Reduction in Depth: (cm) 0.1 Epithelializati Area: (cm) 29.814 Tunneling: Volume: (cm) 2.981 Undermining: Area: -15758.5% Volume: -15589.5% on: Small (1-33%) No No Wound Description Full Thickness Without Classification: Exposed Support Structures Diabetic Severity Grade 1 (Wagner): Wound Margin: Flat and Intact Exudate Amount: Large Exudate Type: Serous Exudate Color: amber Foul Odor After Cleansing: No Wound Bed Granulation Amount: Large (67-100%) Exposed Structure Habig, Tresia D. ([id number]) Granulation Quality: Red, Pink, Hyper-granulation Fascia Exposed: No Necrotic Amount: Small (1-33%) Fat Layer Exposed: No Necrotic Quality: Adherent Slough Tendon Exposed: No Muscle Exposed: No Joint Exposed: No Bone  Exposed: No Limited to Skin Breakdown Periwound Skin Texture Texture Color No Abnormalities Noted: No No Abnormalities Noted: No Callus: No Atrophie Blanche: No Crepitus: No Cyanosis: No Excoriation: No Ecchymosis: No Fluctuance: No Erythema: No Friable: No Hemosiderin Staining: No Induration: No Mottled: No Localized Edema: No Pallor: No Rash: No Rubor: No Scarring: No Temperature / Pain Moisture Tenderness on Palpation: Yes No Abnormalities Noted: No Dry / Scaly: No Maceration: No Moist: Yes Wound Preparation Ulcer Cleansing: Rinsed/Irrigated with Saline Topical Anesthetic Applied: Other: lidocaine 4%, Treatment Notes Wound #5 (Right, Posterior Lower Leg) 1. Cleansed with: Cleanse wound with antibacterial soap and water 2. Anesthetic Topical Lidocaine 4% cream to wound bed prior to debridement 3. Peri-wound Care: Skin Prep 4. Dressing Applied: Mepitel Other dressing (specify in notes) 5. Secondary Dressing Applied Bordered Foam Dressing Dry Gauze Notes apligraf, steri-strips MAKO, KHERA ([id number]) Electronic Signature(s) Signed: 12/22/2015 4:52:05 PM By: Alric Quan Entered By: Alric Quan on 12/22/2015 09:42:59 Leven, Annibelle D. ([id number]) -------------------------------------------------------------------------------- Wound Assessment Details Patient Name: Dillard, Shelba D. Date of Service: 12/22/2015 9:15 AM Medical Record Number: [id number] Patient Account Number: [id number] Date of Birth/Sex: 1946-07-25 (70 y.o. Female) Treating RN: Carolyne Fiscal, Debi Primary Care Physician: Glendon Axe Other Clinician: Referring Physician: Glendon Axe Treating Physician/Extender: Frann Rider in Treatment: 58 Wound Status Wound Number: 6 Primary Diabetic Wound/Ulcer of the Lower Etiology: Extremity Wound Location: Right Lower Leg - Medial Wound Open Wounding Event: Gradually Appeared Status: Date Acquired: 10/10/2015 Comorbid  Cataracts, Asthma, Coronary Artery Weeks Of Treatment: 10 History: Disease, Hypertension, Type II Clustered Wound: No Diabetes, Osteoarthritis, Neuropathy Photos Photo Uploaded By: Alric Quan on 12/22/2015 12:22:17 Wound Measurements Length: (cm) 6.1 Width: (cm) 4.5 Depth: (cm) 0.2 Area: (cm) 21.559 Volume: (cm) 4.312 % Reduction in Area: -523.8% % Reduction in Volume: -1146.2% Epithelialization: None Tunneling: No Undermining: No Wound Description Classification: Grade 1 Wound Margin: Distinct, outline attached Exudate Amount: Medium Exudate Type: Purulent Exudate Color: yellow, brown, green Foul Odor After Cleansing: No Wound Bed Granulation Amount: Small (1-33%) Exposed Structure Granulation Quality: Red Fascia Exposed: No Necrotic Amount: Large (67-100%) Fat Layer Exposed: No Necrotic Quality: Eschar, Adherent Slough Tendon Exposed: No Constante, Kang D. ([id number]) Muscle Exposed: No Joint Exposed: No Bone Exposed: No Limited to Skin Breakdown Periwound Skin Texture Texture Color No Abnormalities Noted: No No Abnormalities Noted: No Callus: No Atrophie Blanche: No Crepitus: No Cyanosis: No Excoriation: No Ecchymosis: No Fluctuance: No Erythema: No Friable: No Hemosiderin Staining: No Induration: No Mottled: No Localized Edema: No Pallor: No Rash: No Rubor:  No Scarring: No Temperature / Pain Moisture Temperature: No Abnormality No Abnormalities Noted: No Tenderness on Palpation: Yes Dry / Scaly: No Maceration: No Moist: Yes Wound Preparation Ulcer Cleansing: Rinsed/Irrigated with Saline Topical Anesthetic Applied: Other: lidocaine 4%, Treatment Notes Wound #6 (Right, Medial Lower Leg) 1. Cleansed with: Cleanse wound with antibacterial soap and water 2. Anesthetic Topical Lidocaine 4% cream to wound bed prior to debridement 3. Peri-wound Care: Skin Prep 4. Dressing Applied: Santyl Ointment 5. Secondary Dressing Applied Bordered  Foam Dressing Dry Gauze Notes kerlix and coban from toes to 3cm below the knee Electronic Signature(s) Signed: 12/22/2015 4:52:05 PM By: Alric Quan Entered By: Alric Quan on 12/22/2015 09:44:08 Dzik, Karter D. ([id number]) Cousins, Vihana D. ([id number]) -------------------------------------------------------------------------------- Vitals Details Patient Name: GHISLAINE, KEHS D. Date of Service: 12/22/2015 9:15 AM Medical Record Number: [id number] Patient Account Number: [id number] Date of Birth/Sex: Mar 31, 1946 (70 y.o. Female) Treating RN: Carolyne Fiscal, Debi Primary Care Physician: Glendon Axe Other Clinician: Referring Physician: Glendon Axe Treating Physician/Extender: Frann Rider in Treatment: 65 Vital Signs Time Taken: 09:31 Temperature (F): 97.6 Height (in): 65 Pulse (bpm): 68 Weight (lbs): 248 Respiratory Rate (breaths/min): 20 Body Mass Index (BMI): 41.3 Blood Pressure (mmHg): 153/52 Reference Range: 80 - 120 mg / dl Electronic Signature(s) Signed: 12/22/2015 4:52:05 PM By: Alric Quan Entered By: Alric Quan on 12/22/2015 09:32:49

## 2015-12-26 ENCOUNTER — Encounter: Payer: Commercial Managed Care - HMO | Attending: Surgery | Admitting: Surgery

## 2015-12-26 DIAGNOSIS — N189 Chronic kidney disease, unspecified: Secondary | ICD-10-CM | POA: Insufficient documentation

## 2015-12-26 DIAGNOSIS — I890 Lymphedema, not elsewhere classified: Secondary | ICD-10-CM | POA: Insufficient documentation

## 2015-12-26 DIAGNOSIS — E11622 Type 2 diabetes mellitus with other skin ulcer: Secondary | ICD-10-CM | POA: Diagnosis present

## 2015-12-26 DIAGNOSIS — Z90710 Acquired absence of both cervix and uterus: Secondary | ICD-10-CM | POA: Insufficient documentation

## 2015-12-26 DIAGNOSIS — I129 Hypertensive chronic kidney disease with stage 1 through stage 4 chronic kidney disease, or unspecified chronic kidney disease: Secondary | ICD-10-CM | POA: Diagnosis not present

## 2015-12-26 DIAGNOSIS — L97212 Non-pressure chronic ulcer of right calf with fat layer exposed: Secondary | ICD-10-CM | POA: Diagnosis not present

## 2015-12-26 DIAGNOSIS — I87311 Chronic venous hypertension (idiopathic) with ulcer of right lower extremity: Secondary | ICD-10-CM | POA: Insufficient documentation

## 2015-12-26 DIAGNOSIS — E7800 Pure hypercholesterolemia, unspecified: Secondary | ICD-10-CM | POA: Diagnosis not present

## 2015-12-26 DIAGNOSIS — M316 Other giant cell arteritis: Secondary | ICD-10-CM | POA: Insufficient documentation

## 2015-12-26 DIAGNOSIS — I2510 Atherosclerotic heart disease of native coronary artery without angina pectoris: Secondary | ICD-10-CM | POA: Insufficient documentation

## 2015-12-27 NOTE — Progress Notes (Signed)
[name], [name] ([id number]) Visit Report for 12/26/2015 Chief Complaint Document Details Patient Name: [name], [name] 12/26/2015 11:00 Date of Service: AM Medical Record [id number] Number: Patient Account Number: [id number] [birth year] (70 y.o. Treating RN: [name] Date of Birth/Sex: Female) Other Clinician: Primary Care Physician: [name], [name] Treating [name] Referring Physician: Glendon [name] Physician/Extender: [name]: 80 Information Obtained from: Patient Chief Complaint Patient presents to the wound care center for a consult due non healing wound 70 year old patient comes with a history of having a ulcer on the left lower extremity for the past 4 Kristin. she says she's had swelling of both lower extremities for about a year after she started having prednisone. 02/07/2015 -- her vascular appointments obtained were in the first and third week of June. she is able to go to [place] and we will try and get her some earlier appointments. Other than that nothing else has changed in her management. Electronic Signature(s) Signed: 12/26/2015 12:02:07 PM By: [name] Entered By: [name] on 12/26/2015 12:02:06 [name], [name] ([id number]) -------------------------------------------------------------------------------- Debridement Details Patient Name: [name], [name] 12/26/2015 11:00 Date of Service: AM Medical Record [id number] Number: Patient Account Number: [id number] Aug 26, 1946 (70 y.o. Treating RN: [name] Date of Birth/Sex: Female) Other Clinician: Primary Care Physician: [name] Referring Physician: Glendon [name] Physician/Extender: [name]: 47 Debridement Performed for Wound #6 Right,Medial Lower Leg Assessment: Performed By: Physician Kristin Fudge, MD Debridement: Debridement Pre-procedure Yes Verification/Time Out Taken: Start Time: 11:44 Pain Control: Lidocaine 4%  Topical Solution Level: Skin/Subcutaneous Tissue Total Area Debrided (L x 6.1 (cm) x 4.1 (cm) = 25.01 (cm) W): Tissue and other Viable, Non-Viable, Eschar, Fibrin/Slough, Subcutaneous material debrided: Instrument: Curette, Forceps, Scissors Bleeding: Minimum Hemostasis Achieved: Pressure End Time: 11:50 Procedural Pain: 0 Post Procedural Pain: 0 Response to [name]: Procedure was tolerated well Post Debridement Measurements of Total Wound Length: (cm) 6.1 Width: (cm) 4.1 Depth: (cm) 0.2 Volume: (cm) 3.929 Post Procedure Diagnosis Same as Pre-procedure Electronic Signature(s) Signed: 12/26/2015 12:01:54 PM By: [name] Signed: 12/26/2015 5:40:45 PM By: [name] Entered By: [name] on 12/26/2015 12:01:54 Stoecker, [name] ([id number]) -------------------------------------------------------------------------------- HPI Details Patient Name: [name], [name] 12/26/2015 11:00 Date of Service: AM Medical Record [id number] Number: Patient Account Number: [id number] 1946-09-25 (70 y.o. Treating RN: [name] Date of Birth/Sex: Female) Other Clinician: Primary Care Physician: [name] Treating [name] Referring Physician: Glendon [name] Physician/Extender: [name]: 52 History of Present Illness HPI Description: 70 year old patient who is known to have diabetes mellitus type 2, chronic renal insufficiency, coronary artery disease, hypertension, hypercholesterolemia, temporal arteritis and inflammatory arthritiss also has a history of having a hysterectomy and some orthopedic related surgeries. The ulcer on the left lower extremity started off as a blister and then. Got progressively worse. She does not have any fever or chills and has not had any recent surgical intervention for this. Her last hemoglobin A1c was 10.1 in September 2015. She has been recently put on doxycycline by her PCP. She is now also allergic to doxycycline  and was this was changed over to Keflex. 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 She does see a cardiologist and also takes a diuretic. 02/07/2015 her arterial and venous duplex studies to be done have dates been given as the first and third week of June. This is  at [name] [place]. We are going to try and get early appointments at [name] other than that nothing has changed in her management. 02/14/2015 -- we have been able to get her an appointment in [facility] on May 20 which is much earlier than her previous ones at [place]. She continues with her prednisone and her sugars are in the range of 150-200. 02/21/2015 We were able to get a vascular lab workup for her today and she is going to be there at 2:00 this afternoon. the swelling of her leg has gone down significantly but she still has some tenderness over the wounds. 02/28/2015 - She has had one of two vascular workups done, and this coming Tuesday has another, at [facility]. She continues to be on steroid medications. She has significant sensitivity in her left lower extremity and has pain suggestive of neuropathic pain and I have asked her to address this with her primary care physician. 03/07/2015 -- The patient saw Dr. Lucky Cowboy for a consultation and he has had her arteries are okay but she has 2 incompetent veins on the left lower extremity and he is going to set her up for surgery. Official report is awaited. Addendum: Official reports are now available and on 03/04/2015. She was seen and lower extremity venous duplex exam was done. There was reflux present within the left greater saphenous vein below the knee and also the left small saphenous vein. Arterial duplex showed normal triphasic waveforms throughout the left lower extremity without any significant stenosis. Her ABIs were noncompressible  bilaterally but a waveforms were normal and a digital pressures were normal bilaterally consistent with no significant arterial insufficiency. He has recommended endovenous ablation of both the left small saphenous and the left great saphenous vein. This would still be scheduled later. 03/14/2015 -- she has heard back from the vascular office and has surgery scheduled for sometime in July. RAVIN, DAVITT ([id number]) Her rheumatologist has decreased her prednisone dosage but she still on it. She has also had cataract surgery in her right Kristin recently this week. 03/20/2015 - No new complaints today. Pain improved. No fever or chills. Tolerating 2 layer compression. 04/14/2015 -- she was doing very well today she went off on vacation and now her edema has increased markedly the ulceration is bigger and her diabetes is not under control. 04/21/2015 -- I spoke to her PCP Dr. Candiss Norse and discussed the management which would include being seen by a general surgeon for debridement and taking multiple punch biopsies which would help in establishing the diagnosis of this is a vasculitis. She is agreeable about this and will set her up for the procedure with Dr. Tamala Julian at [facility]. She was seen by the surgeon Dr. Jamal Collin. 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 She is scheduled to have laser ablation done next week.Pt was sent here for possible biopsy to look for vasculitis. Feel it would be better to wait after laser ablation is completed- the ulcer may heal fully and biopsy may not be necessary 04/29/2015 -- she had the venous ablation done by Dr. Lucky Cowboy last Friday and we do not have any notes yet. She is doing fine otherwise. 05/06/2015 --Review of her recent vascular intervention shows that she was seen by Dr. Lucky Cowboy on 04/29/2015. The follow-up duplex which was done showed that both  the great saphenous vein and the small saphenous vein remained patent  with reflux consistent with an unsuccessful ablation. He has rescheduled her for another the endovenous ablation to be done in about 4 weekso time. 05/13/2015 -- he was seen by her surgeon Dr. Jamal Collin who asked her to continue with conservative therapy and he would speak to Dr. Lucky Cowboy about her management. Dr. Lucky Cowboy is going to schedule her surgery in the middle of August for a repeat endovenous ablation. Her pus culture from last week has grown : Adrian her noted her sensitivity report but due to her multiple allergies I had tried clindamycin and she developed a rash with this too. She has been prescribed and anti-buttocks in the ER and is has it at home and she will let is know what she is going to be taking. 05/20/2015 -- she has developed a small spot on her right lower extremity but besides that it is not a full fledged ulceration. She did not get to see Dr. Lucky Cowboy last week and hopefully she will see him in the near future. 05/27/2015 -she is still awaiting her appointment with Dr.Dew and her vascular procedure is not scheduled until August 19. She will be seeing her PCP tomorrow and I have asked her to convey our discussion so that she is aware that debridement has not been done yet. 06/03/2015 -- was seen by her rheumatologist Dr. 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 He is lowering her prednisone to 12-1/2 mg for 1 month and then 10 mg per the next month. I will again make an attempt to speak to her PCP Dr. Candiss Norse and her surgeon Dr. 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 06/17/2015 -- Dr.Dew did her  vascular procedure last week and a follow-up venous ultrasound shows good resolution of the veins as per the patient's history. He is to see her back in 2 Kristin. [name], [name] ([id number]) 07/07/2015. -- the patient has had a heavy growth of Proteus mirabilis and Enterococcus faecalis. These are sensitive to several drugs but the problem is she has allergies to all of these and hence I would like her to see Dr. Ola Spurr for this. She is also due to see Dr. Lucky Cowboy tomorrow and I will discussed the management with him including debridement under anesthesia and possible biopsies. 07/14/2015 -- she has an appointment to see Dr. Ola Spurr tomorrow and did see Dr. Bunnie Domino PA who will discuss my request with him. 07/21/2015 -- saw Dr. Ola Spurr was able to do a test on her and has put her on amoxicillin. She has been tolerating that and has had no problems with allergies to this. 07/28/2015 -- Last Friday I spoke to Dr. Leotis Pain regarding her care and he said that her right leg did not need any surgery and on the left leg was doing pretty good. We did agree that if she undergoes any procedure in the future he would do a couple of punch biopsies of the wound. 08/18/2015 -- her right leg is very tender and there is significant amount of slough. The left leg is looking much better 09/02/2015 -- she is going to have a debridement and punch biopsies of her right lower extremity by Dr. Leotis Pain this coming Thursday. Also seen Dr. Ola Spurr who has continued her on  ciprofloxacin. 09/09/2015 -- on 09/04/2015 Dr. Leotis Pain took her to surgery - Irrigation and excisional debridement of skin, soft tissue, and muscle to about 40 cm2 to the right posterior calf with biopsy. Pathology results are -- DIAGNOSIS: SKIN, RIGHT LOWER EXTREMITY; BIOPSY: SKIN AND SOFT TISSUE WITH ULCERATION, SEE NOTE. - NEGATIVE FOR DYSPLASIA AND MALIGNANCY. Note: There is acute inflammation and ulceration of the epidermis. The dermis  shows nonspecific inflammation, neovascularization, and hemosiderin deposition. The differential diagnosis for these findings includes stasis dermatitis, nonspecific dermatitis, and infection. Correlation with clinical findings is required. A PAS fungal stain is obtained and results will be reported in an addendum. cultures were also sent and this grew Pseudomonas aeruginosa, Escherichia coli, Proteus mirabilis, Klebsiella pneumoniae and they were all sensitive to ciprofloxacin which she is on. 09/16/2015 -- he saw Dr. 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 He is going to call the patient in and discuss with her the management possibly with Imuran. I will continue treating her locally. 09/23/2015 -- she saw Dr. Ola Spurr today who was going to continue the antibiotics for now and stop after this course. She has an appointment to see Dr. Jefm Bryant in about a week's time. Her wound VAC has arrived but she did not bring it with her today. We will try and set her up for changes to the right leg 3 times a week. She is here for her first application of Apligraf to the left lower extremity. 09/30/2015 -- she has seen Dr. 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 because she is ambulatory at home health will not apply a wound VAC and she will have to come here 3 times a week on Monday Wednesday and Friday. 10/10/2015 -- she is here for a second application of Apligraf to her left lower extremity. 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 Jefm Bryant. Other than that she is doing well 10/31/2015 -- she is here for a third application of Apligraf to her left lower extremity. after reviewing her wound on the right side I noted  that it is granulating extremely well and we will use the remnants of the Apligraf on the right leg. [name], [name] ([id number]) 11/14/2015 -- she was recently admitted to the [name] between January 11 and 11/10/2015 for uncontrolled hypertension, diabetes mellitus, acute kidney injury. during her admission she was supported by wound care and also by antibiotics. Around this time her immunosuppression was stopped by Dr. Nunzio Cory. She is feeling much better now. 11/21/2015 -- she has had her fourth application of Apligraf today and it has been shared between the left and the right leg 11/24/2015 -- was here for a wound VAC change but it was noticed that she had profuse bleeding from the lobe medial part of her right lower extremity and I was called in to evaluate her. 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 She has been doing fine since then 12/19/2015 -- she has finished 5 applications of Apligraf to the left lower extremity and this is doing great. He began with pain and swelling of her right ankle a couple of days ago and it has caused her much distress. She does  not have any fever or any change in her general health. 12/22/2015 -- the patient's pain and swelling of the right lower extremity has resolved over the weekend. The left lower extremity is completely healed. She has had the first application of Apligraf on the right leg today. Electronic Signature(s) Signed: 12/26/2015 12:02:18 PM By: [name] Entered By: [name] on 12/26/2015 12:02:18 [name], [name] ([id number]) -------------------------------------------------------------------------------- Physical Exam Details Patient Name: AVYA, DOLLINS D. 12/26/2015 11:00 Date of Service: AM Medical Record [id number] Number: Patient Account Number: [id number] [birth year] (70 y.o. Treating RN: [name] Date of  Birth/Sex: Female) Other Clinician: Primary Care Physician: [name], [name] Treating [name] Referring Physician: Glendon [name] Physician/Extender: [name]: 47 Constitutional . Pulse regular. Respirations normal and unlabored. Afebrile. . Eyes Nonicteric. Reactive to light. Ears, Nose, Mouth, and Throat Lips, teeth, and gums WNL.Marland Kitchen Moist mucosa without lesions. Neck supple and nontender. No palpable supraclavicular or cervical adenopathy. Normal sized without goiter. Respiratory WNL. No retractions.. Cardiovascular Pedal Pulses WNL. No clubbing, cyanosis or edema. Lymphatic No adneopathy. No adenopathy. No adenopathy. Musculoskeletal Adexa without tenderness or enlargement.. Digits and nails w/o clubbing, cyanosis, infection, petechiae, ischemia, or inflammatory conditions.. Integumentary (Hair, Skin) No suspicious lesions. No crepitus or fluctuance. No peri-wound warmth or erythema. No masses.Marland Kitchen Psychiatric Judgement and insight Intact.. No evidence of depression, anxiety, or agitation.. Notes the medial part of the right lower extremity continues to have significant amount of slough and using a curette, forceps and scissors I removed a large amount of subcutaneous debris and control bleeding with pressure. The area where the Apligraf was applied has been bolstered in place with a few more Steri-Strips Electronic Signature(s) Signed: 12/26/2015 12:03:04 PM By: [name] Entered By: [name] on 12/26/2015 12:03:03 [name], [name] ([id number]) -------------------------------------------------------------------------------- Physician Orders Details Patient Name: [name], [name] 12/26/2015 11:00 Date of Service: AM Medical Record [id number] Number: Patient Account Number: [id number] March 29, 1946 (70 y.o. Treating RN: [name] Date of Birth/Sex: Female) Other Clinician: Primary Care Physician: [facility], [name] Treating Carnisha Feltz Referring  Physician: Glendon [name] Physician/Extender: [name]: 63 Verbal / Phone Orders: Yes Clinician: Montey [name] Read Back and Verified: Yes Diagnosis Coding Wound Cleansing Wound #5 Right,Posterior Lower Leg o Cleanse wound with mild soap and water Wound #6 Right,Medial Lower Leg o Cleanse wound with mild soap and water Anesthetic Wound #5 Right,Posterior Lower Leg o Topical Lidocaine 4% cream applied to wound bed prior to debridement Wound #6 Right,Medial Lower Leg o Topical Lidocaine 4% cream applied to wound bed prior to debridement Skin Barriers/Peri-Wound Care Wound #5 Right,Posterior Lower Leg o Skin Prep Wound #6 Right,Medial Lower Leg o Skin Prep Primary Wound Dressing Wound #5 Right,Posterior Lower Leg o Dry Gauze o Boardered Foam Dressing o Drawtex o Mepitel One - steri-strips Wound #6 Right,Medial Lower Leg o Dry Gauze o Santyl Ointment o Boardered Foam Dressing Dressing Change Frequency [name], [name] ([id number]) Wound #5 Right,Posterior Lower Leg o Change dressing every week Wound #6 Right,Medial Lower Leg o Change dressing every day. Follow-up Appointments Wound #5 Right,Posterior Lower Leg o Return Appointment in 1 week. Wound #6 Right,Medial Lower Leg o Return Appointment in 1 week. Edema Control Wound #5 Right,Posterior Lower Leg o Patient to wear own compression stockings o Elevate legs to the level of the heart and pump ankles as often as possible o Other: - wear compression hose Wound #6 Right,Medial Lower Leg o Patient to wear own compression stockings o Elevate legs to  the level of the heart and pump ankles as often as possible o Other: - wear compression hose Electronic Signature(s) Signed: 12/26/2015 4:29:10 PM By: [name] Signed: 12/26/2015 5:40:45 PM By: [name] Entered By: [name] on 12/26/2015 11:52:29 [name], [name] JENKINS  ([id number]) -------------------------------------------------------------------------------- Problem List Details Patient Name: KHARISSA, MACBETH D. 12/26/2015 11:00 Date of Service: AM Medical Record [id number] Number: Patient Account Number: [id number] 02/28/1946 (70 y.o. Treating RN: [name] Date of Birth/Sex: Female) Other Clinician: Primary Care Physician: [name], [name] Treating [name] Referring Physician: Glendon [name] Physician/Extender: [name]: 73 Active Problems ICD-10 Encounter Code Description Active Date Diagnosis E11.622 Type 2 diabetes mellitus with other skin ulcer 01/31/2015 Yes E66.01 Morbid (severe) obesity due to excess calories 01/31/2015 Yes I89.0 Lymphedema, not elsewhere classified 01/31/2015 Yes L97.212 Non-pressure chronic ulcer of right calf with fat layer 11/24/2015 Yes exposed I87.311 Chronic venous hypertension (idiopathic) with ulcer of 11/24/2015 Yes right lower extremity Inactive Problems Resolved Problems ICD-10 Code Description Active Date Resolved Date L97.322 Non-pressure chronic ulcer of left ankle with fat layer 01/31/2015 01/31/2015 exposed I83.222 Varicose veins of left lower extremity with both ulcer of 03/07/2015 03/07/2015 calf and inflammation I83.223 03/07/2015 03/07/2015 Mickelsen, Livier D. ([id number]) 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 L03.116 Cellulitis of left lower limb 07/07/2015 07/07/2015 Electronic Signature(s) Signed: 12/26/2015 12:01:31 PM By: [name] Entered By: [name] on 12/26/2015 12:01:31 Laverdure, Tia Masker ([id number]) -------------------------------------------------------------------------------- Progress Note Details Patient Name: MUSKAN, DOWN D. 12/26/2015 11:00 Date of Service: AM Medical Record [id number] Number: Patient Account Number: [id number] 1946/08/12 (70 y.o. Treating RN: [name] Date of Birth/Sex: Female) Other  Clinician: Primary Care Physician: [facility], [name] Treating [name] Referring Physician: Glendon [name] Physician/Extender: [name]: 10 Subjective Chief Complaint Information obtained from Patient Patient presents to the wound care center for a consult due non healing wound 70 year old patient comes with a history of having a ulcer on the left lower extremity for the past 4 Kristin. she says she's had swelling of both lower extremities for about a year after she started having prednisone. 02/07/2015 -- her vascular appointments obtained were in the first and third week of June. she is able to go to [place] and we will try and get her some earlier appointments. Other than that nothing else has changed in her management. History of Present Illness (HPI) 70 year old patient who is known to have diabetes mellitus type 2, chronic renal insufficiency, coronary artery disease, hypertension, hypercholesterolemia, temporal arteritis and inflammatory arthritiss also has a history of having a hysterectomy and some orthopedic related surgeries. The ulcer on the left lower extremity started off as a blister and then. Got progressively worse. She does not have any fever or chills and has not had any recent surgical intervention for this. Her last hemoglobin A1c was 10.1 in September 2015. She has been recently put on doxycycline by her PCP. She is now also allergic to doxycycline and was this was changed over to Keflex. 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 She does see a cardiologist and also takes a diuretic. 02/07/2015 her arterial and venous duplex studies to be done have dates been given as the first and third week of June. This is at [name] We are going to try and get early appointments at [facility]. other than that nothing  has changed in her  management. 02/14/2015 -- we have been able to get her an appointment in [name] on May 20 which is much earlier than her previous ones at [place]. She continues with her prednisone and her sugars are in the range of 150-200. 02/21/2015 We were able to get a vascular lab workup for her today and she is going to be there at 2:00 this afternoon. the swelling of her leg has gone down significantly but she still has some tenderness over the wounds. 02/28/2015 - She has had one of two vascular workups done, and this coming Tuesday has another, at [facility]. She continues to be on steroid medications. She has significant sensitivity in her left lower extremity and has pain suggestive of neuropathic pain and I have asked her to address this with her primary care physician. 03/07/2015 -- The patient saw Dr. Lucky Cowboy for a consultation and he has had her arteries are okay but she has 2 incompetent veins on the left lower extremity and he is going to set her up for surgery. Official report is KARINDA, VIGNE ([id number]) awaited. Addendum: Official reports are now available and on 03/04/2015. She was seen and lower extremity venous duplex exam was done. There was reflux present within the left greater saphenous vein below the knee and also the left small saphenous vein. Arterial duplex showed normal triphasic waveforms throughout the left lower extremity without any significant stenosis. Her ABIs were noncompressible bilaterally but a waveforms were normal and a digital pressures were normal bilaterally consistent with no significant arterial insufficiency. He has recommended endovenous ablation of both the left small saphenous and the left great saphenous vein. This would still be scheduled later. 03/14/2015 -- she has heard back from the vascular office and has surgery scheduled for sometime in July. Her rheumatologist has decreased her prednisone dosage but she still on it. She  has also had cataract surgery in her right Kristin recently this week. 03/20/2015 - No new complaints today. Pain improved. No fever or chills. Tolerating 2 layer compression. 04/14/2015 -- she was doing very well today she went off on vacation and now her edema has increased markedly the ulceration is bigger and her diabetes is not under control. 04/21/2015 -- I spoke to her PCP Dr. Candiss Norse and discussed the management which would include being seen by a general surgeon for debridement and taking multiple punch biopsies which would help in establishing the diagnosis of this is a vasculitis. She is agreeable about this and will set her up for the procedure with Dr. Tamala Julian at [facility]. She was seen by the surgeon Dr. Jamal Collin. 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 She is scheduled to have laser ablation done next week.Pt was sent here for possible biopsy to look for vasculitis. Feel it would be better to wait after laser ablation is completed- the ulcer may heal fully and biopsy may not be necessary 04/29/2015 -- she had the venous ablation done by Dr. Lucky Cowboy last Friday and we do not have any notes yet. She is doing fine otherwise. 05/06/2015 --Review of her recent vascular intervention shows that she was seen by Dr. Lucky Cowboy on 04/29/2015. The follow-up duplex which was done showed that both the great saphenous vein and the small saphenous vein remained patent with reflux consistent with an unsuccessful ablation. He has rescheduled her for another the endovenous ablation to be  done in about 4 Kristin time. 05/13/2015 -- he was seen by her surgeon Dr. Jamal Collin who asked her to continue with conservative therapy and he would speak to Dr. Lucky Cowboy about her management. Dr. Lucky Cowboy is going to schedule her surgery in the middle of August for a repeat endovenous ablation. Her pus culture from last  week has grown : Parker her noted her sensitivity report but due to her multiple allergies I had tried clindamycin and she developed a rash with this too. She has been prescribed and anti-buttocks in the ER and is has it at home and she will let is know what she is going to be taking. 05/20/2015 -- she has developed a small spot on her right lower extremity but besides that it is not a full fledged ulceration. She did not get to see Dr. Lucky Cowboy last week and hopefully she will see him in the near future. 05/27/2015 -she is still awaiting her appointment with Dr.Dew and her vascular procedure is not scheduled until August 19. She will be seeing her PCP tomorrow and I have asked her to convey our discussion so that she is aware that debridement has not been done yet. ADRIENE, MENSING ([id number]) 06/03/2015 -- was seen by her rheumatologist Dr. 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 He is lowering her prednisone to 12-1/2 mg for 1 month and then 10 mg per the next month. I will again make an attempt to speak to her PCP Dr. Candiss Norse and her surgeon Dr. 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 06/17/2015 -- Dr.Dew did her vascular procedure last week and a follow-up venous ultrasound shows good resolution of the veins as per the patient's history. He is to see her back in 2 Kristin. 07/07/2015. -- the patient has had a heavy growth of Proteus mirabilis and Enterococcus faecalis. These are sensitive to several drugs but the problem is she has allergies to all of these and hence I would like her to see Dr. Ola Spurr for this. She is also due to see Dr. Lucky Cowboy tomorrow and I will discussed the  management with him including debridement under anesthesia and possible biopsies. 07/14/2015 -- she has an appointment to see Dr. Ola Spurr tomorrow and did see Dr. Bunnie Domino PA who will discuss my request with him. 07/21/2015 -- saw Dr. Ola Spurr was able to do a test on her and has put her on amoxicillin. She has been tolerating that and has had no problems with allergies to this. 07/28/2015 -- Last Friday I spoke to Dr. Leotis Pain regarding her care and he said that her right leg did not need any surgery and on the left leg was doing pretty good. We did agree that if she undergoes any procedure in the future he would do a couple of punch biopsies of the wound. 08/18/2015 -- her right leg is very tender and there is significant amount of slough. The left leg is looking much better 09/02/2015 -- she is going to have a debridement and punch biopsies of her right lower extremity by Dr. Leotis Pain this coming Thursday. Also seen Dr. Ola Spurr who has continued her on ciprofloxacin. 09/09/2015 -- on 09/04/2015 Dr. Leotis Pain took her to surgery - Irrigation and excisional debridement  of skin, soft tissue, and muscle to about 40 cm2 to the right posterior calf with biopsy. Pathology results are -- DIAGNOSIS: SKIN, RIGHT LOWER EXTREMITY; BIOPSY: SKIN AND SOFT TISSUE WITH ULCERATION, SEE NOTE. - NEGATIVE FOR DYSPLASIA AND MALIGNANCY. Note: There is acute inflammation and ulceration of the epidermis. The dermis shows nonspecific inflammation, neovascularization, and hemosiderin deposition. The differential diagnosis for these findings includes stasis dermatitis, nonspecific dermatitis, and infection. Correlation with clinical findings is required. A PAS fungal stain is obtained and results will be reported in an addendum. cultures were also sent and this grew Pseudomonas aeruginosa, Escherichia coli, Proteus mirabilis, Klebsiella pneumoniae and they were all sensitive to ciprofloxacin which she is  on. 09/16/2015 -- he saw Dr. 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 He is going to call the patient in and discuss with her the management possibly with Imuran. I will continue treating her locally. 09/23/2015 -- she saw Dr. Ola Spurr today who was going to continue the antibiotics for now and stop after this course. She has an appointment to see Dr. Jefm Bryant in about a week's time. Her wound VAC has arrived but she did not bring it with her today. We will try and set her up for changes to the right leg 3 times a week. She is here for her first application of Apligraf to the left lower extremity. WILETTA, COLUMBIA ([id number]) 09/30/2015 -- she has seen Dr. 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 because she is ambulatory at home health will not apply a wound VAC and she will have to come here 3 times a week on Monday Wednesday and Friday. 10/10/2015 -- she is here for a second application of Apligraf to her left lower extremity. 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 Jefm Bryant. Other than that she is doing well 10/31/2015 -- she is here for a third application of Apligraf to her left lower extremity. after reviewing her wound on the right side I noted that it is granulating extremely well and we will use the remnants of the Apligraf on the right leg. 11/14/2015 -- she was recently admitted to the [name] between January 11 and 11/10/2015 for uncontrolled hypertension, diabetes mellitus, acute kidney injury. during her admission she was supported by wound care and also by antibiotics. Around this time her immunosuppression was stopped by Dr. Nunzio Cory. She is feeling much better now. 11/21/2015 -- she has had her  fourth application of Apligraf today and it has been shared between the left and the right leg 11/24/2015 -- was here for a wound VAC change but it was noticed that she had profuse bleeding from the lobe medial part of her right lower extremity and I was called in to evaluate her. 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 She has been doing fine since then 12/19/2015 -- she has finished 5 applications of Apligraf to the left lower extremity and this is doing great. He began with pain and swelling of her right ankle a couple of days ago and it has caused her much distress. She does not have any fever or any change in her general health. 12/22/2015 -- the patient's pain and  swelling of the right lower extremity has resolved over the weekend. The left lower extremity is completely healed. She has had the first application of Apligraf on the right leg today. Objective Constitutional Pulse regular. Respirations normal and unlabored. Afebrile. Vitals Time Taken: 11:21 AM, Height: 65 in, Weight: 248 lbs, BMI: 41.3, Temperature: 98.0 F, Pulse: 59 bpm, Respiratory Rate: 18 breaths/min, Blood Pressure: 173/68 mmHg. Eyes Nonicteric. Reactive to light. Ears, Nose, Mouth, and Throat Lips, teeth, and gums WNL.Marland Kitchen Moist mucosa without lesions. Neck Fredricksen, Courtnay D. ([id number]) supple and nontender. No palpable supraclavicular or cervical adenopathy. Normal sized without goiter. Respiratory WNL. No retractions.. Cardiovascular Pedal Pulses WNL. No clubbing, cyanosis or edema. Lymphatic No adneopathy. No adenopathy. No adenopathy. Musculoskeletal Adexa without tenderness or enlargement.. Digits and nails w/o clubbing, cyanosis, infection, petechiae, ischemia, or inflammatory conditions.Marland Kitchen Psychiatric Judgement and insight Intact.. No evidence of depression, anxiety, or agitation.. General Notes: the medial  part of the right lower extremity continues to have significant amount of slough and using a curette, forceps and scissors I removed a large amount of subcutaneous debris and control bleeding with pressure. The area where the Apligraf was applied has been bolstered in place with a few more Steri-Strips Integumentary (Hair, Skin) No suspicious lesions. No crepitus or fluctuance. No peri-wound warmth or erythema. No masses.. Wound #5 status is Open. Original cause of wound was Gradually Appeared. The wound is located on the Right,Posterior Lower Leg. The wound measures 5.2cm length x 7.3cm width x 0.1cm depth; 29.814cm^2 area and 2.981cm^3 volume. Wound #6 status is Open. Original cause of wound was Gradually Appeared. The wound is located on the Right,Medial Lower Leg. The wound measures 6.1cm length x 4.1cm width x 0.2cm depth; 19.643cm^2 area and 3.929cm^3 volume. The wound is limited to skin breakdown. There is no tunneling or undermining noted. There is a medium amount of purulent drainage noted. The wound margin is distinct with the outline attached to the wound base. There is medium (34-66%) red granulation within the wound bed. There is a medium (34-66%) amount of necrotic tissue within the wound bed including Eschar and Adherent Slough. The periwound skin appearance exhibited: Moist. The periwound skin appearance did not exhibit: Callus, Crepitus, Excoriation, Fluctuance, Friable, Induration, Localized Edema, Rash, Scarring, Dry/Scaly, Maceration, Atrophie Blanche, Cyanosis, Ecchymosis, Hemosiderin Staining, Mottled, Pallor, Rubor, Erythema. Periwound temperature was noted as No Abnormality. The periwound has tenderness on palpation. 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 There was a Skin/Subcutaneous Tissue Debridement [id number]) debridement with total area of 25.01 sq cm performed by Kristin Fudge, MD. with the following instrument(s): Curette, Forceps, and Scissors to remove Viable and Non-Viable tissue/material including Fibrin/Slough, Eschar, and Subcutaneous after achieving pain control using Lidocaine 4% Topical Solution. A time out was conducted prior to the start of the procedure. A Minimum amount of bleeding was controlled with Pressure. The procedure was tolerated well with a pain level of 0 throughout and a pain level of 0 following the procedure. Post Debridement Measurements: 6.1cm length x 4.1cm width x 0.2cm depth; 3.929cm^3 volume. Post procedure Diagnosis Wound #6: Same as  Pre-Procedure Plan Wound Cleansing: Wound #5 Right,Posterior Lower Leg: Cleanse wound with mild soap and water Wound #6 Right,Medial Lower Leg: Cleanse wound with mild soap and water Anesthetic: Wound #5 Right,Posterior Lower Leg: Topical Lidocaine 4% cream applied to wound bed prior to debridement Wound #6 Right,Medial Lower Leg: Topical Lidocaine 4% cream applied to wound bed prior to debridement Skin Barriers/Peri-Wound Care: Wound #5 Right,Posterior Lower Leg: Skin Prep Wound #6 Right,Medial Lower Leg: Skin Prep Primary Wound Dressing: Wound #5 Right,Posterior Lower Leg: Dry Gauze Tomb, Royalti D. ([id number]) Boardered Foam Dressing Drawtex Mepitel One - steri-strips Wound #6 Right,Medial Lower Leg: Dry Gauze Santyl Ointment Boardered Foam Dressing Dressing Change Frequency: Wound #5 Right,Posterior Lower Leg: Change dressing every week Wound #6 Right,Medial Lower Leg: Change dressing every  day. Follow-up Appointments: Wound #5 Right,Posterior Lower Leg: Return Appointment in 1 week. Wound #6 Right,Medial Lower Leg: Return Appointment in 1 week. Edema Control: Wound #5 Right,Posterior Lower Leg: Patient to wear own compression stockings Elevate legs to the level of the heart and pump ankles as often as possible Other: - wear compression hose Wound #6 Right,Medial Lower Leg: Patient to wear own compression stockings Elevate legs to the level of the heart and pump ankles as often as possible Other: - wear compression hose the left leg is completely healed and we will continue with compression stockings. Right leg medial part will be with Santyl ointment on a daily basis and the Apligraf area is to be protected well. We will also continue with compression stockings on this leg. She will be back to see me next week for her second application of Apligraf. Electronic Signature(s) Signed: 12/26/2015 12:03:49 PM By: [name] Entered By: [name] on 12/26/2015 12:03:49 Eickholt, Edelmira D. ([id number]) -------------------------------------------------------------------------------- SuperBill Details Patient Name: JAVAE, KINDLER D. Date of Service: 12/26/2015 Medical Record Number: [id number] Patient Account Number: [id number] Date of Birth/Sex: [birth year] (70 y.o. 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212 Non-pressure chronic ulcer of right calf with fat l  I87.311 Chronic venous hypertension (idiopathic) with ulcer Modifier: ayer exposed of right lowe Quantity: 1 r extremity Physician Procedures CPT4 Code Description: [id number] - WC PHYS SUBQ TISS 20 SQ CM ICD-10 Description Diagnosis E11.622 Type 2 diabetes mellitus with other skin ulcer I89.0 Lymphedema, not elsewhere classified I87.311 Chronic venous hypertension (idiopathic) with ulcer  Delarosa, Raya D. ([id number]) Modifier: of right lower Quantity: 1 extremity Electronic Signature(s) Signed: 12/26/2015 12:04:53 PM By: [name] Previous Signature: 12/26/2015 12:04:27 PM Version By: [name] Entered By: [name] on 12/26/2015 12:04:53

## 2015-12-27 NOTE — Progress Notes (Signed)
DLYNN, HATAWAY ([id number]) Visit Report for 12/26/2015 Arrival Information Details Patient Name: [name], [name] Date of Service: 12/26/2015 11:00 AM Medical Record Number: [id number] Patient Account Number: [id number] Date of Birth/Sex: Jul 27, 1946 (70 y.o. Female) Treating RN: [name] Primary Care Physician: Glendon Axe Other Clinician: Referring Physician: Glendon Axe Treating Physician/Extender: Frann Rider in Treatment: 68 Visit Information History Since Last Visit Added or deleted any medications: No Patient Arrived: Cane Any new allergies or adverse reactions: No Arrival Time: 11:18 Had a fall or experienced change in No Accompanied By: self activities of daily living that may affect Transfer Assistance: None risk of falls: Patient Identification Verified: Yes Signs or symptoms of abuse/neglect since last No Secondary Verification Process Yes visito Completed: Hospitalized since last visit: No Patient Requires Transmission- No Pain Present Now: No Based Precautions: Patient Has Alerts: Yes Patient Alerts: Patient on Blood Thinner Electronic Signature(s) Signed: 12/26/2015 5:40:45 PM By: [name] Entered By: [name] on 12/26/2015 11:20:37 Wierman, Verdelle D. ([id number]) -------------------------------------------------------------------------------- Encounter Discharge Information Details Patient Name: [name], [name] Date of Service: 12/26/2015 11:00 AM Medical Record Number: [id number] Patient Account Number: [id number] Date of Birth/Sex: [birth year] (70 y.o. Female) Treating RN: [name] Primary Care Physician: Glendon Axe Other Clinician: Referring Physician: Glendon Axe Treating Physician/Extender: Frann Rider in Treatment: 41 Encounter Discharge Information Items Discharge Pain Level: 0 Discharge Condition: Stable Ambulatory Status: Cane Discharge Destination: Home Transportation: Private Auto Accompanied By:  self Schedule Follow-up Appointment: Yes Medication Reconciliation completed No and provided to Patient/Care Adamarys Shall: Provided on Clinical Summary of Care: 12/26/2015 Form Type Recipient Paper Patient LM Electronic Signature(s) Signed: 12/26/2015 4:55:15 PM By: [name] Previous Signature: 12/26/2015 12:07:52 PM Version By: Ruthine Dose Entered By: [name] on 12/26/2015 16:55:15 Timmons, Shundra D. ([id number]) -------------------------------------------------------------------------------- Lower Extremity Assessment Details Patient Name: [name], [name] Date of Service: 12/26/2015 11:00 AM Medical Record Number: [id number] Patient Account Number: [id number] Date of Birth/Sex: 1946-01-23 (70 y.o. Female) Treating RN: [name] Primary Care Physician: Glendon Axe Other Clinician: Referring Physician: Glendon Axe Treating Physician/Extender: Frann Rider in Treatment: 47 Edema Assessment Assessed: [Left: No] [Right: No] Edema: [Left: Ye] [Right: s] Vascular Assessment Pulses: Posterior Tibial Dorsalis Pedis Palpable: [Right:Yes] Extremity colors, hair growth, and conditions: Extremity Color: [Right:Normal] Hair Growth on Extremity: [Right:No] Temperature of Extremity: [Right:Warm] Capillary Refill: [Right:< 3 seconds] Electronic Signature(s) Signed: 12/26/2015 5:40:45 PM By: [name] Entered By: [name] on 12/26/2015 11:31:18 Dunsmore, Blakelee D. ([id number]) -------------------------------------------------------------------------------- Multi Wound Chart Details Patient Name: Kristin Stare D. Date of Service: 12/26/2015 11:00 AM Medical Record Number: [id number] Patient Account Number: [id number] Date of Birth/Sex: [birth year] (70 y.o. 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] [N/A:N/A] Comorbid History: [5:N/A] [6:Cataracts, Asthma, Coronary Artery Disease, Hypertension, Type II Diabetes, Osteoarthritis, Neuropathy] [N/A:N/A] Date Acquired: [5:04/29/2015] [6:10/10/2015] [N/A:N/A] Weeks of Treatment: [5:31] [6:11] [N/A:N/A] Wound Status: [5:Open] [6:Open] [N/A:N/A] Measurements L x W x D 5.2x7.3x0.1 [6:6.1x4.1x0.2] [N/A:N/A] (cm) Area (cm) : [5:29.814] [6:19.643] [N/A:N/A] Volume (cm) : [5:2.981] [6:3.929] [N/A:N/A] % Reduction in Area: [5:-15758.50%] [6:-468.40%] [N/A:N/A] % Reduction in  Volume: -15589.50% [6:-1035.50%] [N/A:N/A] Classification: [5:Full Thickness Without Exposed Support Structures] [6:Grade 1] [N/A:N/A] Exudate Amount: [5:N/A] [6:Medium] [N/A:N/A] Exudate Type: [5:N/A] [6:Purulent] [N/A:N/A] Exudate Color: [5:N/A] [6:yellow, brown, green] [N/A:N/A] Wound Margin: [5:N/A] [6:Distinct, outline attached] [N/A:N/A] Granulation Amount: [5:N/A] [6:Medium (34-66%)] [N/A:N/A] Granulation Quality: [5:N/A] [6:Red] [N/A:N/A] Necrotic Amount: [5:N/A] [6:Medium (34-66%)] [N/A:N/A] Necrotic Tissue: N/A Eschar, Adherent Slough N/A Epithelialization: N/A None N/A Periwound Skin Texture: No Abnormalities Noted Edema: No N/A Excoriation: No Induration: No Callus: No Crepitus: No Fluctuance: No Friable: No Rash: No Scarring: No Periwound Skin No Abnormalities Noted Moist: Yes N/A Moisture: Maceration: No Dry/Scaly: No Periwound Skin Color: No Abnormalities Noted Atrophie Blanche: No N/A Cyanosis:  No Ecchymosis: No Erythema: No Hemosiderin Staining: No Mottled: No Pallor: No Rubor: No Temperature: N/A No Abnormality N/A Tenderness on No Yes N/A Palpation: Wound Preparation: N/A Ulcer Cleansing: N/A Rinsed/Irrigated with Saline Topical Anesthetic Applied: Other: lidocaine 4% Treatment Notes Electronic Signature(s) Signed: 12/26/2015 5:40:45 PM By: [name] Entered By: [name] on 12/26/2015 11:31:38 Malecki, Thamar D. ([id number]) -------------------------------------------------------------------------------- [place] Details Patient Name: JERRICKA, SOWDERS D. Date of Service: 12/26/2015 11:00 AM Medical Record Number: [id number] Patient Account Number: [id number] Date of Birth/Sex: Jun 11, 1946 (70 y.o. Female) Treating RN: [name] Primary Care Physician: Glendon Axe Other Clinician: Referring Physician: Glendon Axe Treating Physician/Extender: Frann Rider in Treatment: 72 Active Inactive Orientation to the Wound Care Program Nursing Diagnoses: Knowledge deficit related to the wound healing center program Goals: Patient/caregiver will verbalize understanding of the [place] Program Date Initiated: 01/31/2015 Goal Status: Active Interventions: Provide education on orientation to the wound center Notes: Venous Leg Ulcer Nursing Diagnoses: Potential for venous Insuffiency (use before diagnosis confirmed) Goals: Non-invasive venous studies are completed as ordered Date Initiated: 01/31/2015 Goal Status: Active Patient/caregiver will verbalize understanding of disease process and disease management Date Initiated: 01/31/2015 Goal Status: Active Interventions: Assess peripheral edema status every visit. Notes: Wound/Skin Impairment Nursing Diagnoses: Impaired tissue integrity Knowledge deficit related to smoking impact on wound healing [place]way, Freddye D. ([id number]) Goals: Patient/caregiver will verbalize  understanding of skin care regimen Date Initiated: 01/31/2015 Goal Status: Active Ulcer/skin breakdown will heal within 14 weeks Date Initiated: 01/31/2015 Goal Status: Active Interventions: Assess ulceration(s) every visit Notes: Electronic Signature(s) Signed: 12/26/2015 5:40:45 PM By: [name] Entered By: [name] on 12/26/2015 11:31:31 Rebstock, Jaquelyne D. ([id number]) -------------------------------------------------------------------------------- Patient/Caregiver Education Details Patient Name: Kristin Stare D. Date of Service: 12/26/2015 11:00 AM Medical Record Number: [id number] Patient Account Number: [id number] Date of Birth/Gender: 01-09-1946 (70 y.o. Female) Treating RN: [name] Primary Care Physician: Glendon Axe Other Clinician: Referring Physician: Glendon Axe Treating Physician/Extender: Frann Rider in Treatment: 80 Education Assessment Education Provided To: Patient Education Topics Provided Wound/Skin Impairment: Handouts: Other: wound care as ordered Methods: Demonstration, Explain/Verbal Responses: State content correctly Electronic Signature(s) Signed: 12/26/2015 4:55:33 PM By: [name] Entered By: [name] on 12/26/2015 16:55:33 Ijames, Ruhani D. ([id number]) -------------------------------------------------------------------------------- Wound Assessment Details Patient Name: Ostrand, Ireene D. Date of Service: 12/26/2015 11:00 AM Medical Record Number: [id number] Patient Account Number: [id number] Date of Birth/Sex: 02/07/1946 (70 y.o. Female) Treating RN: [name] Primary Care Physician: Glendon Axe Other Clinician: Referring Physician: Glendon Axe Treating Physician/Extender: Frann Rider in Treatment: 47 Wound Status Wound Number: 5 Primary Etiology: Venous Leg Ulcer Wound Location: Right, Posterior Lower Leg Wound Status: Open Wounding Event: Gradually Appeared Date Acquired: 04/29/2015 Weeks Of  Treatment: 31 Clustered Wound: No Photos Photo Uploaded By: [name] on 12/26/2015 16:40:13 Wound Measurements Length: (  cm) 5.2 Width: (cm) 7.3 Depth: (cm) 0.1 Area: (cm) 29.814 Volume: (cm) 2.981 % Reduction in Area: -15758.5% % Reduction in Volume: -15589.5% Wound Description Full Thickness Without Exposed Classification: Support Structures Periwound Skin Texture Texture Color No Abnormalities Noted: No No Abnormalities Noted: No Moisture No Abnormalities Noted: No Treatment Notes Wound #5 (Right, Posterior Lower Leg) Creary, Ronny D. ([id number]) 1. Cleansed with: Cleanse wound with antibacterial soap and water 2. Anesthetic Topical Lidocaine 4% cream to wound bed prior to debridement 3. Peri-wound Care: Skin Prep 4. Dressing Applied: Mepitel Other dressing (specify in notes) 5. Secondary Dressing Applied Bordered Foam Dressing Dry Gauze Notes drawtex Electronic Signature(s) Signed: 12/26/2015 5:40:45 PM By: [name] Entered By: [name] on 12/26/2015 11:29:46 Coplen, Tacie D. ([id number]) -------------------------------------------------------------------------------- Wound Assessment Details Patient Name: Parthasarathy, Zamari D. Date of Service: 12/26/2015 11:00 AM Medical Record Number: [id number] Patient Account Number: [id number] Date of Birth/Sex: 29-Nov-1945 (70 y.o. Female) Treating RN: [name] Primary Care Physician: Glendon Axe Other Clinician: Referring Physician: Glendon Axe Treating Physician/Extender: Frann Rider in Treatment: 47 Wound Status Wound Number: 6 Primary Diabetic Wound/Ulcer of the Lower Etiology: Extremity Wound Location: Right Lower Leg - Medial Wound Open Wounding Event: Gradually Appeared Status: Date Acquired: 10/10/2015 Comorbid Cataracts, Asthma, Coronary Artery Weeks Of Treatment: 11 History: Disease, Hypertension, Type II Clustered Wound: No Diabetes, Osteoarthritis,  Neuropathy Photos Photo Uploaded By: [name] on 12/26/2015 16:40:14 Wound Measurements Length: (cm) 6.1 Width: (cm) 4.1 Depth: (cm) 0.2 Area: (cm) 19.643 Volume: (cm) 3.929 % Reduction in Area: -468.4% % Reduction in Volume: -1035.5% Epithelialization: None Tunneling: No Undermining: No Wound Description Classification: Grade 1 Wound Margin: Distinct, outline attached Exudate Amount: Medium Exudate Type: Purulent Exudate Color: yellow, brown, green Foul Odor After Cleansing: No Wound Bed Granulation Amount: Medium (34-66%) Exposed Structure Granulation Quality: Red Fascia Exposed: No Necrotic Amount: Medium (34-66%) Fat Layer Exposed: No Necrotic Quality: Eschar, Adherent Slough Tendon Exposed: No Beaumier, Gladies D. ([id number]) Muscle Exposed: No Joint Exposed: No Bone Exposed: No Limited to Skin Breakdown Periwound Skin Texture Texture Color No Abnormalities Noted: No No Abnormalities Noted: No Callus: No Atrophie Blanche: No Crepitus: No Cyanosis: No Excoriation: No Ecchymosis: No Fluctuance: No Erythema: No Friable: No Hemosiderin Staining: No Induration: No Mottled: No Localized Edema: No Pallor: No Rash: No Rubor: No Scarring: No Temperature / Pain Moisture Temperature: No Abnormality No Abnormalities Noted: No Tenderness on Palpation: Yes Dry / Scaly: No Maceration: No Moist: Yes Wound Preparation Ulcer Cleansing: Rinsed/Irrigated with Saline Topical Anesthetic Applied: Other: lidocaine 4%, Treatment Notes Wound #6 (Right, Medial Lower Leg) 1. Cleansed with: Clean wound with Normal Saline 2. Anesthetic Topical Lidocaine 4% cream to wound bed prior to debridement 3. Peri-wound Care: Skin Prep 4. Dressing Applied: Santyl Ointment 5. Secondary Dressing Applied Bordered Foam Dressing Dry Gauze Electronic Signature(s) Signed: 12/26/2015 5:40:45 PM By: [name] Entered By: [name] on 12/26/2015 11:30:54 Alarid, Rilee  D. ([id number]) -------------------------------------------------------------------------------- [place] Details Patient Name: Kristin Stare D. Date of Service: 12/26/2015 11:00 AM Medical Record Number: [id number] Patient Account Number: [id number] Date of Birth/Sex: 1946/02/25 (70 y.o. Female) Treating RN: [name] Primary Care Physician: Glendon Axe Other Clinician: Referring Physician: Glendon Axe Treating Physician/Extender: Frann Rider in Treatment: 65 Vital Signs Time Taken: 11:21 Temperature (F): 98.0 Height (in): 65 Pulse (bpm): 59 Weight (lbs): 248 Respiratory Rate (breaths/min): 18 Body Mass Index (BMI): 41.3 Blood Pressure (mmHg): 173/68 Reference Range: 80 - 120 mg / dl Electronic Signature(s) Signed: 12/26/2015 5:40:45 PM By:  Dorthy, Di Kindle Entered By: [name] on 12/26/2015 11:23:38

## 2016-01-02 ENCOUNTER — Encounter: Payer: Commercial Managed Care - HMO | Admitting: Surgery

## 2016-01-02 DIAGNOSIS — E11622 Type 2 diabetes mellitus with other skin ulcer: Secondary | ICD-10-CM | POA: Diagnosis not present

## 2016-01-03 NOTE — Progress Notes (Signed)
[name], [name] ([id number]) Visit Report for 01/02/2016 Arrival Information Details Patient Name: [name], [name] Date of Service: 01/02/2016 2:00 PM Medical Record Number: [id number] Patient Account Number: [id number] Date of Birth/Sex: [birth year] (70 y.o. Female) Treating RN: [name] Primary Care Physician: Glendon Axe Other Clinician: Referring Physician: Glendon Axe Treating Physician/Extender: Frann Rider in Treatment: 70 Visit Information History Since Last Visit Added or deleted any medications: No Patient Arrived: Cane Any new allergies or adverse reactions: No Arrival Time: 14:13 Had Kristin fall or experienced change in No Accompanied By: self activities of daily living that may affect Transfer Assistance: None risk of falls: Patient Identification Verified: Yes Signs or symptoms of abuse/neglect since last No Secondary Verification Process Yes visito Completed: Hospitalized since last visit: No Patient Requires Transmission- No Pain Present Now: No Based Precautions: Patient Has Alerts: Yes Patient Alerts: Patient on Blood Thinner Electronic Signature(s) Signed: 01/02/2016 7:01:59 PM By: [name] Entered By: [name] on 01/02/2016 14:13:50 [name], [name] ([id number]) -------------------------------------------------------------------------------- Encounter Discharge Information Details Patient Name: [name], [name] D. Date of Service: 01/02/2016 2:00 PM Medical Record Number: [id number] Patient Account Number: [id number] Date of Birth/Sex: Dec 30, 1945 (70 y.o. Female) Treating RN: [name] Primary Care Physician: Glendon Axe Other Clinician: Referring Physician: Glendon Axe Treating Physician/Extender: Frann Rider in Treatment: 96 Encounter Discharge Information Items Discharge Pain Level: 0 Discharge Condition: Stable Ambulatory Status: Cane Discharge Destination: Home Private Transportation: Auto Accompanied  By: self Schedule Follow-up Appointment: Yes Medication Reconciliation completed and No provided to Patient/Care Porsha Skilton: Clinical Summary of Care: Electronic Signature(s) Signed: 01/02/2016 6:17:08 PM By: [name] Entered By: [name] on 01/02/2016 18:17:08 Wandell, Nasiah D. ([id number]) -------------------------------------------------------------------------------- Lower Extremity Assessment Details Patient Name: [name] D. Date of Service: 01/02/2016 2:00 PM Medical Record Number: [id number] Patient Account Number: [id number] Date of Birth/Sex: [birth year] (70 y.o. Female) Treating RN: [name] Primary Care Physician: Glendon Axe Other Clinician: Referring Physician: Glendon Axe Treating Physician/Extender: Frann Rider in Treatment: 48 Edema Assessment Assessed: [Left: No] [Right: No] Edema: [Left: Ye] [Right: s] Vascular Assessment Pulses: Posterior Tibial Dorsalis Pedis Palpable: [Right:Yes] Extremity colors, hair growth, and conditions: Extremity Color: [Right:Hyperpigmented] Hair Growth on Extremity: [Right:Yes] Temperature of Extremity: [Right:Warm] Capillary Refill: [Right:< 3 seconds] Electronic Signature(s) Signed: 01/02/2016 7:01:59 PM By: [name] Entered By: [name] on 01/02/2016 14:30:02 [name], [name] ([id number]) -------------------------------------------------------------------------------- Multi Wound Chart Details Patient Name: [name] D. Date of Service: 01/02/2016 2:00 PM Medical Record Number: [id number] Patient Account Number: [id number] Date of Birth/Sex: [birth year] (70 y.o. Female) Treating RN: [name] Primary Care Physician: Glendon Axe Other Clinician: Referring Physician: Glendon Axe Treating Physician/Extender: Frann Rider in Treatment: 48 Vital Signs Height(in): 65 Pulse(bpm): 79 Weight(lbs): 248 Blood Pressure 152/55 (mmHg): Body Mass Index(BMI):  41 Temperature(F): 97.8 Respiratory Rate 18 (breaths/min): Photos: [5:No Photos] [6:No Photos] [N/Kristin:N/Kristin] Wound Location: [5:Right Lower Leg - Posterior] [6:Right Lower Leg - Medial N/Kristin] Wounding Event: [5:Gradually Appeared] [6:Gradually Appeared] [N/Kristin:N/Kristin] Primary Etiology: [5:Venous Leg Ulcer] [6:Diabetic Wound/Ulcer of the Lower Extremity] [N/Kristin:N/Kristin] Comorbid History: [5:Cataracts, Asthma, Coronary Artery Disease, Coronary Artery Disease, Hypertension, Type II Diabetes, Osteoarthritis, Diabetes, Osteoarthritis, Neuropathy] [6:Cataracts, Asthma, Hypertension, Type II Neuropathy] [N/Kristin:N/Kristin] Date Acquired: [5:04/29/2015] [6:10/10/2015] [N/Kristin:N/Kristin] Weeks of Treatment: [5:32] [6:12] [N/Kristin:N/Kristin] Wound Status: [5:Open] [6:Open] [N/Kristin:N/Kristin] Measurements L x W x D 5.2x5.8x0.1 [6:6.2x4.2x0.2] [N/Kristin:N/Kristin] (cm) Area (cm) : [5:23.688] [6:20.452] [N/Kristin:N/Kristin] Volume (cm) : [5:2.369] [6:4.09] [N/Kristin:N/Kristin] % Reduction in Area: [5:-12500.00%] [6:-491.80%] [N/Kristin:N/Kristin] % Reduction in Volume: -12368.40% [6:-1082.10%] [N/Kristin:N/Kristin] Classification: [5:Full Thickness  Without Exposed Support Structures] [6:Grade 1] [N/Kristin:N/Kristin] HBO Classification: [5:Grade 1] [6:N/Kristin] [N/Kristin:N/Kristin] Exudate Amount: [5:Large] [6:Medium] [N/Kristin:N/Kristin] Exudate Type: [5:Serous] [6:Purulent] [N/Kristin:N/Kristin] Exudate Color: [5:amber] [6:yellow, brown, green] [N/Kristin:N/Kristin] Wound Margin: [5:Flat and Intact] [6:Distinct, outline attached] [N/Kristin:N/Kristin] Granulation Amount: [5:Large (67-100%)] [6:Large (67-100%)] [N/Kristin:N/Kristin] Granulation Quality: [5:Red, Hyper-granulation] [6:Red] [N/Kristin:N/Kristin] Necrotic Amount: Small (1-33%) Small (1-33%) N/Kristin Necrotic Tissue: Adherent Slough Eschar, Adherent Slough N/Kristin Exposed Structures: Fascia: No Fascia: No N/Kristin Fat: No Fat: No Tendon: No Tendon: No Muscle: No Muscle: No Joint: No Joint: No Bone: No Bone: No Limited to Skin Limited to Skin Breakdown Breakdown Epithelialization: None None N/Kristin Periwound Skin Texture: Edema: No Edema: No  N/Kristin Excoriation: No Excoriation: No Induration: No Induration: No Callus: No Callus: No Crepitus: No Crepitus: No Fluctuance: No Fluctuance: No Friable: No Friable: No Rash: No Rash: No Scarring: No Scarring: No Periwound Skin Moist: Yes Moist: Yes N/Kristin Moisture: Maceration: No Maceration: No Dry/Scaly: No Dry/Scaly: No Periwound Skin Color: Atrophie Blanche: No Atrophie Blanche: No N/Kristin Cyanosis: No Cyanosis: No Ecchymosis: No Ecchymosis: No Erythema: No Erythema: No Hemosiderin Staining: No Hemosiderin Staining: No Mottled: No Mottled: No Pallor: No Pallor: No Rubor: No Rubor: No Temperature: N/Kristin No Abnormality N/Kristin Tenderness on Yes Yes N/Kristin Palpation: Wound Preparation: Ulcer Cleansing: Ulcer Cleansing: N/Kristin Rinsed/Irrigated with Rinsed/Irrigated with Saline Saline Topical Anesthetic Topical Anesthetic Applied: Other: lidocaine Applied: Other: lidocaine 4% 4% Treatment Notes Electronic Signature(s) Signed: 01/02/2016 7:01:59 PM By: [name] Entered By: [name] on 01/02/2016 14:30:24 [name], [name] ([id number]) -------------------------------------------------------------------------------- Funk Details Patient Name: [name], [name] D. Date of Service: 01/02/2016 2:00 PM Medical Record Number: [id number] Patient Account Number: [id number] Date of Birth/Sex: Mar 20, 1946 (70 y.o. Female) Treating RN: [name] Primary Care Physician: Glendon Axe Other Clinician: Referring Physician: Glendon Axe Treating Physician/Extender: Frann Rider in Treatment: 86 Active Inactive Orientation to the Wound Care Program Nursing Diagnoses: Knowledge deficit related to the wound healing center program Goals: Patient/caregiver will verbalize understanding of the [facility] Date Initiated: 01/31/2015 Goal Status: Active Interventions: Provide education on orientation to the wound  center Notes: Venous Leg Ulcer Nursing Diagnoses: Potential for venous Insuffiency (use before diagnosis confirmed) Goals: Non-invasive venous studies are completed as ordered Date Initiated: 01/31/2015 Goal Status: Active Patient/caregiver will verbalize understanding of disease process and disease management Date Initiated: 01/31/2015 Goal Status: Active Interventions: Assess peripheral edema status every visit. Notes: Wound/Skin Impairment Nursing Diagnoses: Impaired tissue integrity Knowledge deficit related to smoking impact on wound healing [name], [name] ([id number]) Goals: Patient/caregiver will verbalize understanding of skin care regimen Date Initiated: 01/31/2015 Goal Status: Active Ulcer/skin breakdown will heal within 14 weeks Date Initiated: 01/31/2015 Goal Status: Active Interventions: Assess ulceration(s) every visit Notes: Electronic Signature(s) Signed: 01/02/2016 7:01:59 PM By: [name] Entered By: [name] on 01/02/2016 14:30:15 [name], [name] ([id number]) -------------------------------------------------------------------------------- Patient/Caregiver Education Details Patient Name: [name] D. Date of Service: 01/02/2016 2:00 PM Medical Record Number: [id number] Patient Account Number: [id number] Date of Birth/Gender: [birth year] (69 y.o. Female) Treating RN: [name] Primary Care Physician: Glendon Axe Other Clinician: Referring Physician: Glendon Axe Treating Physician/Extender: Frann Rider in Treatment: 14 Education Assessment Education Provided To: Patient Education Topics Provided Wound/Skin Impairment: Handouts: Other: wound care as ordered Methods: Demonstration, Explain/Verbal Responses: State content correctly Electronic Signature(s) Signed: 01/02/2016 6:17:24 PM By: [name] Entered By: [name] on 01/02/2016 18:17:24 [name], [name]  ([id number]) -------------------------------------------------------------------------------- Wound Assessment Details Patient Name: [name], [name] Date of Service: 01/02/2016 2:00 PM  Medical Record Number: [id number] Patient Account Number: [id number] Date of Birth/Sex: 1946-09-27 (70 y.o. Female) Treating RN: [name] Primary Care Physician: Glendon Axe Other Clinician: Referring Physician: Glendon Axe Treating Physician/Extender: Frann Rider in Treatment: 61 Wound Status Wound Number: 5 Primary Venous Leg Ulcer Etiology: Wound Location: Right Lower Leg - Posterior Wound Open Wounding Event: Gradually Appeared Status: Date Acquired: 04/29/2015 Comorbid Cataracts, Asthma, Coronary Artery Weeks Of Treatment: 32 History: Disease, Hypertension, Type II Clustered Wound: No Diabetes, Osteoarthritis, Neuropathy Photos Photo Uploaded By: [name] on 01/02/2016 18:30:27 Wound Measurements Length: (cm) 5.2 Width: (cm) 5.8 Depth: (cm) 0.1 Area: (cm) 23.688 Volume: (cm) 2.369 % Reduction in Area: -12500% % Reduction in Volume: -12368.4% Epithelialization: None Tunneling: No Undermining: No Wound Description Full Thickness Without Classification: Exposed Support Structures Diabetic Severity Grade 1 (Wagner): Wound Margin: Flat and Intact Exudate Amount: Large Exudate Type: Serous Exudate Color: amber Foul Odor After Cleansing: No Wound Bed Granulation Amount: Large (67-100%) Exposed Structure Straus, Swannie D. ([id number]) Granulation Quality: Red, Hyper-granulation Fascia Exposed: No Necrotic Amount: Small (1-33%) Fat Layer Exposed: No Necrotic Quality: Adherent Slough Tendon Exposed: No Muscle Exposed: No Joint Exposed: No Bone Exposed: No Limited to Skin Breakdown Periwound Skin Texture Texture Color No Abnormalities Noted: No No Abnormalities Noted: No Callus: No Atrophie Blanche: No Crepitus: No Cyanosis: No Excoriation:  No Ecchymosis: No Fluctuance: No Erythema: No Friable: No Hemosiderin Staining: No Induration: No Mottled: No Localized Edema: No Pallor: No Rash: No Rubor: No Scarring: No Temperature / Pain Moisture Tenderness on Palpation: Yes No Abnormalities Noted: No Dry / Scaly: No Maceration: No Moist: Yes Wound Preparation Ulcer Cleansing: Rinsed/Irrigated with Saline Topical Anesthetic Applied: Other: lidocaine 4%, Treatment Notes Wound #5 (Right, Posterior Lower Leg) 1. Cleansed with: Clean wound with Normal Saline 2. Anesthetic Topical Lidocaine 4% cream to wound bed prior to debridement 3. Peri-wound Care: Skin Prep 4. Dressing Applied: Mepitel 5. Secondary Dressing Applied Bordered Foam Dressing Dry Gauze Notes drawtex ALEIGHYA, [name] ([id number]) Electronic Signature(s) Signed: 01/02/2016 7:01:59 PM By: [name] Entered By: [name] on 01/02/2016 14:26:02 [name], Tylea D. ([id number]) -------------------------------------------------------------------------------- Wound Assessment Details Patient Name: Brocato, Tambra D. Date of Service: 01/02/2016 2:00 PM Medical Record Number: [id number] Patient Account Number: [id number] Date of Birth/Sex: 1946-09-13 (70 y.o. Female) Treating RN: [name] Primary Care Physician: Glendon Axe Other Clinician: Referring Physician: Glendon Axe Treating Physician/Extender: Frann Rider in Treatment: 91 Wound Status Wound Number: 6 Primary Diabetic Wound/Ulcer of the Lower Etiology: Extremity Wound Location: Right Lower Leg - Medial Wound Open Wounding Event: Gradually Appeared Status: Date Acquired: 10/10/2015 Comorbid Cataracts, Asthma, Coronary Artery Weeks Of Treatment: 12 History: Disease, Hypertension, Type II Clustered Wound: No Diabetes, Osteoarthritis, Neuropathy Photos Photo Uploaded By: [name] on 01/02/2016 18:30:28 Wound Measurements Length: (cm) 6.2 Width: (cm) 4.2 Depth:  (cm) 0.2 Area: (cm) 20.452 Volume: (cm) 4.09 % Reduction in Area: -491.8% % Reduction in Volume: -1082.1% Epithelialization: None Tunneling: No Undermining: No Wound Description Classification: Grade 1 Wound Margin: Distinct, outline attached Exudate Amount: Medium Exudate Type: Purulent Exudate Color: yellow, brown, green Foul Odor After Cleansing: No Wound Bed Granulation Amount: Large (67-100%) Exposed Structure Granulation Quality: Red Fascia Exposed: No Necrotic Amount: Small (1-33%) Fat Layer Exposed: No Necrotic Quality: Eschar, Adherent Slough Tendon Exposed: No Heidt, Loralei D. ([id number]) Muscle Exposed: No Joint Exposed: No Bone Exposed: No Limited to Skin Breakdown Periwound Skin Texture Texture Color No Abnormalities Noted: No No Abnormalities Noted: No Callus: No Atrophie Blanche: No  Crepitus: No Cyanosis: No Excoriation: No Ecchymosis: No Fluctuance: No Erythema: No Friable: No Hemosiderin Staining: No Induration: No Mottled: No Localized Edema: No Pallor: No Rash: No Rubor: No Scarring: No Temperature / Pain Moisture Temperature: No Abnormality No Abnormalities Noted: No Tenderness on Palpation: Yes Dry / Scaly: No Maceration: No Moist: Yes Wound Preparation Ulcer Cleansing: Rinsed/Irrigated with Saline Topical Anesthetic Applied: Other: lidocaine 4%, Treatment Notes Wound #6 (Right, Medial Lower Leg) 1. Cleansed with: Clean wound with Normal Saline 2. Anesthetic Topical Lidocaine 4% cream to wound bed prior to debridement 3. Peri-wound Care: Skin Prep 4. Dressing Applied: Santyl Ointment 5. Secondary Dressing Applied Bordered Foam Dressing Dry Gauze Electronic Signature(s) Signed: 01/02/2016 7:01:59 PM By: [name] Entered By: [name] on 01/02/2016 14:29:39 Vallance, Aliahna D. ([id number]) -------------------------------------------------------------------------------- Vitals Details Patient Name: [name]  D. Date of Service: 01/02/2016 2:00 PM Medical Record Number: [id number] Patient Account Number: [id number] Date of Birth/Sex: 23-Jan-1946 (70 y.o. Female) Treating RN: [name] Primary Care Physician: Glendon Axe Other Clinician: Referring Physician: Glendon Axe Treating Physician/Extender: Frann Rider in Treatment: 48 Vital Signs Time Taken: 14:13 Temperature (F): 97.8 Height (in): 65 Pulse (bpm): 79 Weight (lbs): 248 Respiratory Rate (breaths/min): 18 Body Mass Index (BMI): 41.3 Blood Pressure (mmHg): 152/55 Reference Range: 80 - 120 mg / dl Electronic Signature(s) Signed: 01/02/2016 7:01:59 PM By: [name] Entered By: [name] on 01/02/2016 14:16:54

## 2016-01-03 NOTE — Progress Notes (Signed)
KRYSTAL, CANJURA ([id number]) Visit Report for 01/02/2016 Chief Complaint Document Details Patient Name: [name], [name] 01/02/2016 2:00 Date of Service: PM Medical Record [id number] Number: Patient Account Number: [id number] 10/03/1946 (70 y.o. Treating RN: [name] Date of Birth/Sex: Female) Other Clinician: Primary Care Physician: [facility], Delana Meyer Treating [name] Referring Physician: Glendon Axe Physician/Extender: Weeks in Treatment: 20 Information Obtained from: Patient Chief Complaint Patient presents to the wound care center for a consult due non healing wound 70 year old patient comes with a history of having a ulcer on the left lower extremity for the past 4 weeks. she says she's had swelling of both lower extremities for about a year after she started having prednisone. 02/07/2015 -- her vascular appointments obtained were in the first and third week of June. she is able to go to [place] and we will try and get her some earlier appointments. Other than that nothing else has changed in her management. Electronic Signature(s) Signed: 01/02/2016 3:05:43 PM By: [name], [name] Entered By: [name] on 01/02/2016 15:05:43 Millan, TAQUASIA SAWICKI ([id number]) -------------------------------------------------------------------------------- Cellular or Tissue Based Product Details Patient Name: [name], [name] 01/02/2016 2:00 Date of Service: PM Medical Record [id number] Number: Patient Account Number: [id number] 1946-03-05 (70 y.o. Treating RN: [name] Date of Birth/Sex: Female) Other Clinician: Primary Care Physician: [facility], Delana Meyer Treating [name] Referring Physician: Glendon Axe Physician/Extender: Weeks in Treatment: 69 Cellular or Tissue Based Wound #5 Right,Posterior Lower Leg Product Type Applied to: Performed By: Physician [name], MD Cellular or Tissue Based Apligraf Product Type: Time-Out Taken: Yes Location: genitalia  / hands / feet / multiple digits Wound Size (sq cm): 30.16 Product Size (sq cm): 44 Waste Size (sq cm): 13 Waste Reason: wound size Amount of Product Applied (sq cm): 31 Lot #: [id number] Expiration Date: 01/09/2016 Fenestrated: Yes Instrument: Blade Reconstituted: Yes Solution Type: saline Solution Amount: 58ml Lot #: [id number] Solution Expiration 08/25/2017 Date: Secured: Yes Secured With: Steri-Strips Dressing Applied: Yes Primary Dressing: mepitel one Procedural Pain: 0 Post Procedural Pain: 0 Response to Treatment: Procedure was tolerated well Post Procedure Diagnosis Same as Pre-procedure Electronic Signature(s) Signed: 01/02/2016 3:05:36 PM By: [name], [name] Ternes, Janiyha D. ([id number]) Entered By: [name] on 01/02/2016 15:05:36 Bobe, Toyna D. ([id number]) -------------------------------------------------------------------------------- HPI Details Patient Name: [name], [name] 01/02/2016 2:00 Date of Service: PM Medical Record [id number] Number: Patient Account Number: [id number] 08-17-1946 (70 y.o. Treating RN: [name] Date of Birth/Sex: Female) Other Clinician: Primary Care Physician: [facility], Delana Meyer Treating Jakolby Sedivy Referring Physician: Glendon Axe Physician/Extender: Weeks in Treatment: 26 History of Present Illness HPI Description: 70 year old patient who is known to have diabetes mellitus type 2, chronic renal insufficiency, coronary artery disease, hypertension, hypercholesterolemia, temporal arteritis and inflammatory arthritiss also has a history of having a hysterectomy and some orthopedic related surgeries. The ulcer on the left lower extremity started off as a blister and then. Got progressively worse. She does not have any fever or chills and has not had any recent surgical intervention for this. Her last hemoglobin A1c was 10.1 in September 2015. She has been recently put on doxycycline by her PCP. She is now  also allergic to doxycycline and was this was changed over to Keflex. 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 She does see a cardiologist and also takes a diuretic. 02/07/2015 her arterial and venous duplex studies to be done  have dates been given as the first and third week of June. This is at [facility]. We are going to try and get early appointments at [facility]. other than that nothing has changed in her management. 02/14/2015 -- we have been able to get her an appointment in [facility] on May 20 which is much earlier than her previous ones at [place]. She continues with her prednisone and her sugars are in the range of 150-200. 02/21/2015 We were able to get a vascular lab workup for her today and she is going to be there at 2:00 this afternoon. the swelling of her leg has gone down significantly but she still has some tenderness over the wounds. 02/28/2015 - She has had one of two vascular workups done, and this coming Tuesday has another, at [facility]. She continues to be on steroid medications. She has significant sensitivity in her left lower extremity and has pain suggestive of neuropathic pain and I have asked her to address this with her primary care physician. 03/07/2015 -- The patient saw Dr. Lucky Cowboy for a consultation and he has had her arteries are okay but she has 2 incompetent veins on the left lower extremity and he is going to set her up for surgery. Official report is awaited. Addendum: Official reports are now available and on 03/04/2015. She was seen and lower extremity venous duplex exam was done. There was reflux present within the left greater saphenous vein below the knee and also the left small saphenous vein. Arterial duplex showed normal triphasic waveforms throughout the left lower extremity without any significant stenosis.  Her ABIs were noncompressible bilaterally but a waveforms were normal and a digital pressures were normal bilaterally consistent with no significant arterial insufficiency. He has recommended endovenous ablation of both the left small saphenous and the left great saphenous vein. This would still be scheduled later. 03/14/2015 -- she has heard back from the vascular office and has surgery scheduled for sometime in July. ADDYLIN, ACCETTA ([id number]) Her rheumatologist has decreased her prednisone dosage but she still on it. She has also had cataract surgery in her right eye recently this week. 03/20/2015 - No new complaints today. Pain improved. No fever or chills. Tolerating 2 layer compression. 04/14/2015 -- she was doing very well today she went off on vacation and now her edema has increased markedly the ulceration is bigger and her diabetes is not under control. 04/21/2015 -- I spoke to her PCP Dr. Candiss Norse and discussed the management which would include being seen by a general surgeon for debridement and taking multiple punch biopsies which would help in establishing the diagnosis of this is a vasculitis. She is agreeable about this and will set her up for the procedure with Dr. Tamala Julian at [facility]. She was seen by the surgeon Dr. Jamal Collin. 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 She is scheduled to have laser ablation done next week.Pt was sent here for possible biopsy to look for vasculitis. Feel it would be better to wait after laser ablation is completed- the ulcer may heal fully and biopsy may not be necessary 04/29/2015 -- she had the venous ablation done by Dr. Lucky Cowboy last Friday and we do not have any notes yet. She is doing fine otherwise. 05/06/2015 --Review of her recent vascular intervention shows that she was seen by Dr. Lucky Cowboy on 04/29/2015. The follow-up duplex  which was done  showed that both the great saphenous vein and the small saphenous vein remained patent with reflux consistent with an unsuccessful ablation. He has rescheduled her for another the endovenous ablation to be done in about 4 weekso time. 05/13/2015 -- he was seen by her surgeon Dr. Jamal Collin who asked her to continue with conservative therapy and he would speak to Dr. Lucky Cowboy about her management. Dr. Lucky Cowboy is going to schedule her surgery in the middle of August for a repeat endovenous ablation. Her pus culture from last week has grown : [place] her noted her sensitivity report but due to her multiple allergies I had tried clindamycin and she developed a rash with this too. She has been prescribed and anti-buttocks in the ER and is has it at home and she will let is know what she is going to be taking. 05/20/2015 -- she has developed a small spot on her right lower extremity but besides that it is not a full fledged ulceration. She did not get to see Dr. Lucky Cowboy last week and hopefully she will see him in the near future. 05/27/2015 -she is still awaiting her appointment with Dr.Dew and her vascular procedure is not scheduled until August 19. She will be seeing her PCP tomorrow and I have asked her to convey our discussion so that she is aware that debridement has not been done yet. 06/03/2015 -- was seen by her rheumatologist Dr. 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 He is lowering her prednisone to 12-1/2 mg for 1 month and then 10 mg per the next month. I will again make an attempt to speak to her PCP Dr. Candiss Norse and her surgeon Dr. Lucky Cowboy to see if he can organize for a debridement in the OR with multiple biopsies to establish a diagnosis of vasculitis or pyoderma  gangrenosum. 06/17/2015 -- Dr.Dew did her vascular procedure last week and a follow-up venous ultrasound shows good resolution of the veins as per the patient's history. He is to see her back in 2 weeks. MOLLIE, DEVEY ([id number]) 07/07/2015. -- the patient has had a heavy growth of Proteus mirabilis and Enterococcus faecalis. These are sensitive to several drugs but the problem is she has allergies to all of these and hence I would like her to see Dr. Ola Spurr for this. She is also due to see Dr. Lucky Cowboy tomorrow and I will discussed the management with him including debridement under anesthesia and possible biopsies. 07/14/2015 -- she has an appointment to see Dr. Ola Spurr tomorrow and did see Dr. Bunnie Domino PA who will discuss my request with him. 07/21/2015 -- saw Dr. Ola Spurr was able to do a test on her and has put her on amoxicillin. She has been tolerating that and has had no problems with allergies to this. 07/28/2015 -- Last Friday I spoke to Dr. Leotis Pain regarding her care and he said that her right leg did not need any surgery and on the left leg was doing pretty good. We did agree that if she undergoes any procedure in the future he would do a couple of punch biopsies of the wound. 08/18/2015 -- her right leg is very tender and there is significant amount of slough. The left leg is looking much better 09/02/2015 -- she is going to have a debridement and punch biopsies of her right lower extremity by Dr.  Leotis Pain this coming Thursday. Also seen Dr. Ola Spurr who has continued her on ciprofloxacin. 09/09/2015 -- on 09/04/2015 Dr. Leotis Pain took her to surgery - Irrigation and excisional debridement of skin, soft tissue, and muscle to about 40 cm2 to the right posterior calf with biopsy. Pathology results are -- DIAGNOSIS: SKIN, RIGHT LOWER EXTREMITY; BIOPSY: SKIN AND SOFT TISSUE WITH ULCERATION, SEE NOTE. - NEGATIVE FOR DYSPLASIA AND MALIGNANCY. Note: There is acute inflammation  and ulceration of the epidermis. The dermis shows nonspecific inflammation, neovascularization, and hemosiderin deposition. The differential diagnosis for these findings includes stasis dermatitis, nonspecific dermatitis, and infection. Correlation with clinical findings is required. A PAS fungal stain is obtained and results will be reported in an addendum. cultures were also sent and this grew Pseudomonas aeruginosa, Escherichia coli, Proteus mirabilis, Klebsiella pneumoniae and they were all sensitive to ciprofloxacin which she is on. 09/16/2015 -- he saw Dr. 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 He is going to call the patient in and discuss with her the management possibly with Imuran. I will continue treating her locally. 09/23/2015 -- she saw Dr. Ola Spurr today who was going to continue the antibiotics for now and stop after this course. She has an appointment to see Dr. Jefm Bryant in about a week's time. Her wound VAC has arrived but she did not bring it with her today. We will try and set her up for changes to the right leg 3 times a week. She is here for her first application of Apligraf to the left lower extremity. 09/30/2015 -- she has seen Dr. 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 because she is ambulatory at home health will not apply a wound VAC and she will have to come here 3 times a week on Monday Wednesday and Friday. 10/10/2015 -- she is here for a second application of Apligraf to her left lower extremity. 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 Jefm Bryant. Other than that she is doing well 10/31/2015 -- she is here for a third application of Apligraf to her left lower extremity. after  reviewing her wound on the right side I noted that it is granulating extremely well and we will use the remnants of the Apligraf on the right leg. AYLIANA, BRUNKEN ([id number]) 11/14/2015 -- she was recently admitted to the hospital between January 11 and 11/10/2015 for uncontrolled hypertension, diabetes mellitus, acute kidney injury. during her admission she was supported by wound care and also by antibiotics. Around this time her immunosuppression was stopped by Dr. Nunzio Cory. She is feeling much better now. 11/21/2015 -- she has had her fourth application of Apligraf today and it has been shared between the left and the right leg 11/24/2015 -- was here for a wound VAC change but it was noticed that she had profuse bleeding from the lobe medial part of her right lower extremity and I was called in to evaluate her. 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 She has been doing fine since then 12/19/2015 -- she has finished 5 applications of Apligraf to the left lower extremity and this is doing great. He began with pain and swelling of her right ankle  a couple of days ago and it has caused her much distress. She does not have any fever or any change in her general health. 12/22/2015 -- the patient's pain and swelling of the right lower extremity has resolved over the weekend. The left lower extremity is completely healed. She has had the first application of Apligraf on the right leg today. 01/02/2016 -- She is here for the second application of Apligraf on the right leg today. Electronic Signature(s) Signed: 01/02/2016 3:06:40 PM By: [name], [name] Previous Signature: 01/02/2016 3:05:54 PM Version By: [name], [name] Entered By: [name] on 01/02/2016 15:06:40 AHNESTI, SABATELLI ([id number]) -------------------------------------------------------------------------------- Physical Exam  Details Patient Name: KAWANDA, BURLINGHAM D. 01/02/2016 2:00 Date of Service: PM Medical Record [id number] Number: Patient Account Number: [id number] 03-01-1946 (70 y.o. Treating RN: [name] Date of Birth/Sex: Female) Other Clinician: Primary Care Physician: [facility], Delana Meyer Treating [name] Referring Physician: Glendon Axe Physician/Extender: Weeks in Treatment: 48 Constitutional . Pulse regular. Respirations normal and unlabored. Afebrile. . Eyes Nonicteric. Reactive to light. Ears, Nose, Mouth, and Throat Lips, teeth, and gums WNL.Marland Kitchen Moist mucosa without lesions. Neck supple and nontender. No palpable supraclavicular or cervical adenopathy. Normal sized without goiter. Respiratory WNL. No retractions.. Cardiovascular Pedal Pulses WNL. No clubbing, cyanosis or edema. Lymphatic No adneopathy. No adenopathy. No adenopathy. Musculoskeletal Adexa without tenderness or enlargement.. Digits and nails w/o clubbing, cyanosis, infection, petechiae, ischemia, or inflammatory conditions.. Integumentary (Hair, Skin) No suspicious lesions. No crepitus or fluctuance. No peri-wound warmth or erythema. No masses.Marland Kitchen Psychiatric Judgement and insight Intact.. No evidence of depression, anxiety, or agitation.. Notes the left leg is completely healed. The right anterior ulcer continues to have some slough in the anterior lateral part and this is too tender to debride and hence we will use Santyl ointment. The posterior lateral part of the right leg has clean granulation tissue and after freshening this up with moist saline gauze will apply the second application of Apligraf. Electronic Signature(s) Signed: 01/02/2016 3:07:53 PM By: [name], [name] Entered By: [name] on 01/02/2016 15:07:53 Sakata, ARWYN GIARDINA ([id number]) -------------------------------------------------------------------------------- Physician Orders Details Patient Name: PRESTON, AUMANN D. 01/02/2016 2:00 Date  of Service: PM Medical Record [id number] Number: Patient Account Number: [id number] 12/21/1945 (70 y.o. Treating RN: [name] Date of Birth/Sex: Female) Other Clinician: Primary Care Physician: [facility], Delana Meyer Treating Offie Waide Referring Physician: Glendon Axe Physician/Extender: Weeks in Treatment: 60 Verbal / Phone Orders: Yes Clinician: [name] Read Back and Verified: Yes Diagnosis Coding Wound Cleansing Wound #5 Right,Posterior Lower Leg o Cleanse wound with mild soap and water Wound #6 Right,Medial Lower Leg o Cleanse wound with mild soap and water Anesthetic Wound #5 Right,Posterior Lower Leg o Topical Lidocaine 4% cream applied to wound bed prior to debridement Wound #6 Right,Medial Lower Leg o Topical Lidocaine 4% cream applied to wound bed prior to debridement Skin Barriers/Peri-Wound Care Wound #5 Right,Posterior Lower Leg o Skin Prep Wound #6 Right,Medial Lower Leg o Skin Prep Primary Wound Dressing Wound #5 Right,Posterior Lower Leg o Dry Gauze o Boardered Foam Dressing o Mepitel One - steri-strips o Other: - Apligraph Wound #6 Right,Medial Lower Leg o Dry Gauze o Santyl Ointment o Boardered Foam Dressing Dressing Change Frequency Myint, Irine D. ([id number]) Wound #5 Right,Posterior Lower Leg o Change dressing every week Wound #6 Right,Medial Lower Leg o Change dressing every other day. Follow-up Appointments Wound #5 Right,Posterior Lower Leg o Return Appointment in 1 week. Wound #6 Right,Medial Lower Leg o Return Appointment in  1 week. Edema Control Wound #5 Right,Posterior Lower Leg o Elevate legs to the level of the heart and pump ankles as often as possible o Other: - wear compression hose Wound #6 Right,Medial Lower Leg o Elevate legs to the level of the heart and pump ankles as often as possible o Other: - wear compression hose Electronic Signature(s) Signed: 01/02/2016 3:47:35  PM By: [name], [name] Signed: 01/02/2016 7:01:59 PM By: [name] Entered By: [name] on 01/02/2016 14:48:42 Tremain, Milley D. ([id number]) -------------------------------------------------------------------------------- Problem List Details Patient Name: KYNLEA, ROSSETTO D. 01/02/2016 2:00 Date of Service: PM Medical Record [id number] Number: Patient Account Number: [id number] 14-Feb-1946 (70 y.o. Treating RN: [name] Date of Birth/Sex: Female) Other Clinician: Primary Care Physician: [facility], Delana Meyer Treating [name] Referring Physician: Glendon Axe Physician/Extender: Weeks in Treatment: 45 Active Problems ICD-10 Encounter Code Description Active Date Diagnosis E11.622 Type 2 diabetes mellitus with other skin ulcer 01/31/2015 Yes E66.01 Morbid (severe) obesity due to excess calories 01/31/2015 Yes I89.0 Lymphedema, not elsewhere classified 01/31/2015 Yes L97.212 Non-pressure chronic ulcer of right calf with fat layer 11/24/2015 Yes exposed I87.311 Chronic venous hypertension (idiopathic) with ulcer of 11/24/2015 Yes right lower extremity Inactive Problems Resolved Problems ICD-10 Code Description Active Date Resolved Date L97.322 Non-pressure chronic ulcer of left ankle with fat layer 01/31/2015 01/31/2015 exposed I83.222 Varicose veins of left lower extremity with both ulcer of 03/07/2015 03/07/2015 calf and inflammation I83.223 03/07/2015 03/07/2015 Glinski, Abrianna D. ([id number]) 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 L03.116 Cellulitis of left lower limb 07/07/2015 07/07/2015 Electronic Signature(s) Signed: 01/02/2016 3:05:28 PM By: [name], [name] Entered By: [name] on 01/02/2016 15:05:28 Guyette, Mannie D. ([id number]) -------------------------------------------------------------------------------- Progress Note Details Patient Name: [name] 01/02/2016 2:00 Date of Service: PM Medical  Record [id number] Number: Patient Account Number: [id number] 1945-12-19 (70 y.o. Treating RN: [name] Date of Birth/Sex: Female) Other Clinician: Primary Care Physician: [facility], Delana Meyer Treating [name] Referring Physician: Glendon Axe Physician/Extender: Weeks in Treatment: 12 Subjective Chief Complaint Information obtained from Patient Patient presents to the wound care center for a consult due non healing wound 70 year old patient comes with a history of having a ulcer on the left lower extremity for the past 4 weeks. she says she's had swelling of both lower extremities for about a year after she started having prednisone. 02/07/2015 -- her vascular appointments obtained were in the first and third week of June. she is able to go to [place] and we will try and get her some earlier appointments. Other than that nothing else has changed in her management. History of Present Illness (HPI) 70 year old patient who is known to have diabetes mellitus type 2, chronic renal insufficiency, coronary artery disease, hypertension, hypercholesterolemia, temporal arteritis and inflammatory arthritiss also has a history of having a hysterectomy and some orthopedic related surgeries. The ulcer on the left lower extremity started off as a blister and then. Got progressively worse. She does not have any fever or chills and has not had any recent surgical intervention for this. Her last hemoglobin A1c was 10.1 in September 2015. She has been recently put on doxycycline by her PCP. She is now also allergic to doxycycline and was this was changed over to Keflex. 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 She does see a cardiologist and also takes a diuretic. 02/07/2015 her  arterial and venous duplex studies to be done have dates been given as the first and third week of June. This  is at [facility] We are going to try and get early appointments at [facility]. other than that nothing has changed in her management. 02/14/2015 -- we have been able to get her an appointment in [facility] on May 20 which is much earlier than her previous ones at [place]. She continues with her prednisone and her sugars are in the range of 150-200. 02/21/2015 We were able to get a vascular lab workup for her today and she is going to be there at 2:00 this afternoon. the swelling of her leg has gone down significantly but she still has some tenderness over the wounds. 02/28/2015 - She has had one of two vascular workups done, and this coming Tuesday has another, at [facility]. She continues to be on steroid medications. She has significant sensitivity in her left lower extremity and has pain suggestive of neuropathic pain and I have asked her to address this with her primary care physician. 03/07/2015 -- The patient saw Dr. Lucky Cowboy for a consultation and he has had her arteries are okay but she has 2 incompetent veins on the left lower extremity and he is going to set her up for surgery. Official report is PAIRLEE, HERDER ([id number]) awaited. Addendum: Official reports are now available and on 03/04/2015. She was seen and lower extremity venous duplex exam was done. There was reflux present within the left greater saphenous vein below the knee and also the left small saphenous vein. Arterial duplex showed normal triphasic waveforms throughout the left lower extremity without any significant stenosis. Her ABIs were noncompressible bilaterally but a waveforms were normal and a digital pressures were normal bilaterally consistent with no significant arterial insufficiency. He has recommended endovenous ablation of both the left small saphenous and the left great saphenous vein. This would still be scheduled later. 03/14/2015 -- she has heard back from the vascular  office and has surgery scheduled for sometime in July. Her rheumatologist has decreased her prednisone dosage but she still on it. She has also had cataract surgery in her right eye recently this week. 03/20/2015 - No new complaints today. Pain improved. No fever or chills. Tolerating 2 layer compression. 04/14/2015 -- she was doing very well today she went off on vacation and now her edema has increased markedly the ulceration is bigger and her diabetes is not under control. 04/21/2015 -- I spoke to her PCP Dr. Candiss Norse and discussed the management which would include being seen by a general surgeon for debridement and taking multiple punch biopsies which would help in establishing the diagnosis of this is a vasculitis. She is agreeable about this and will set her up for the procedure with Dr. Tamala Julian at [facility]. She was seen by the surgeon Dr. Jamal Collin. 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 She is scheduled to have laser ablation done next week.Pt was sent here for possible biopsy to look for vasculitis. Feel it would be better to wait after laser ablation is completed- the ulcer may heal fully and biopsy may not be necessary 04/29/2015 -- she had the venous ablation done by Dr. Lucky Cowboy last Friday and we do not have any notes yet. She is doing fine otherwise. 05/06/2015 --Review of her recent vascular intervention shows that she was seen by Dr. Lucky Cowboy  on 04/29/2015. The follow-up duplex which was done showed that both the great saphenous vein and the small saphenous vein remained patent with reflux consistent with an unsuccessful ablation. He has rescheduled her for another the endovenous ablation to be done in about 4 weeks time. 05/13/2015 -- he was seen by her surgeon Dr. Jamal Collin who asked her to continue with conservative therapy and he would speak to Dr. Lucky Cowboy about her  management. Dr. Lucky Cowboy is going to schedule her surgery in the middle of August for a repeat endovenous ablation. Her pus culture from last week has grown : Tub[place] her noted her sensitivity report but due to her multiple allergies I had tried clindamycin and she developed a rash with this too. She has been prescribed and anti-buttocks in the ER and is has it at home and she will let is know what she is going to be taking. 05/20/2015 -- she has developed a small spot on her right lower extremity but besides that it is not a full fledged ulceration. She did not get to see Dr. Lucky Cowboy last week and hopefully she will see him in the near future. 05/27/2015 -she is still awaiting her appointment with Dr.Dew and her vascular procedure is not scheduled until August 19. She will be seeing her PCP tomorrow and I have asked her to convey our discussion so that she is aware that debridement has not been done yet. ANNALIN, KAGLE ([id number]) 06/03/2015 -- was seen by her rheumatologist Dr. 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 He is lowering her prednisone to 12-1/2 mg for 1 month and then 10 mg per the next month. I will again make an attempt to speak to her PCP Dr. Candiss Norse and her surgeon Dr. 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 06/17/2015 -- Dr.Dew did her vascular procedure last week and a follow-up venous ultrasound shows good resolution of the veins as per the patient's history. He is to see her back in 2 weeks. 07/07/2015. -- the patient has had a heavy growth of Proteus mirabilis and Enterococcus faecalis. These are sensitive to several drugs but the problem is she has allergies to  all of these and hence I would like her to see Dr. Ola Spurr for this. She is also due to see Dr. Lucky Cowboy tomorrow and I will discussed the management with him including debridement under anesthesia and possible biopsies. 07/14/2015 -- she has an appointment to see Dr. Ola Spurr tomorrow and did see Dr. Bunnie Domino PA who will discuss my request with him. 07/21/2015 -- saw Dr. Ola Spurr was able to do a test on her and has put her on amoxicillin. She has been tolerating that and has had no problems with allergies to this. 07/28/2015 -- Last Friday I spoke to Dr. Leotis Pain regarding her care and he said that her right leg did not need any surgery and on the left leg was doing pretty good. We did agree that if she undergoes any procedure in the future he would do a couple of punch biopsies of the wound. 08/18/2015 -- her right leg is very tender and there is significant amount of slough. The left leg is looking much better 09/02/2015 -- she is going to have a debridement and  punch biopsies of her right lower extremity by Dr. Leotis Pain this coming Thursday. Also seen Dr. Ola Spurr who has continued her on ciprofloxacin. 09/09/2015 -- on 09/04/2015 Dr. Leotis Pain took her to surgery - Irrigation and excisional debridement of skin, soft tissue, and muscle to about 40 cm2 to the right posterior calf with biopsy. Pathology results are -- DIAGNOSIS: SKIN, RIGHT LOWER EXTREMITY; BIOPSY: SKIN AND SOFT TISSUE WITH ULCERATION, SEE NOTE. - NEGATIVE FOR DYSPLASIA AND MALIGNANCY. Note: There is acute inflammation and ulceration of the epidermis. The dermis shows nonspecific inflammation, neovascularization, and hemosiderin deposition. The differential diagnosis for these findings includes stasis dermatitis, nonspecific dermatitis, and infection. Correlation with clinical findings is required. A PAS fungal stain is obtained and results will be reported in an addendum. cultures were also sent and this grew Pseudomonas  aeruginosa, Escherichia coli, Proteus mirabilis, Klebsiella pneumoniae and they were all sensitive to ciprofloxacin which she is on. 09/16/2015 -- he saw Dr. 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 He is going to call the patient in and discuss with her the management possibly with Imuran. I will continue treating her locally. 09/23/2015 -- she saw Dr. Ola Spurr today who was going to continue the antibiotics for now and stop after this course. She has an appointment to see Dr. Jefm Bryant in about a week's time. Her wound VAC has arrived but she did not bring it with her today. We will try and set her up for changes to the right leg 3 times a week. She is here for her first application of Apligraf to the left lower extremity. NELANI, MCILROY ([id number]) 09/30/2015 -- she has seen Dr. 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 because she is ambulatory at home health will not apply a wound VAC and she will have to come here 3 times a week on Monday Wednesday and Friday. 10/10/2015 -- she is here for a second application of Apligraf to her left lower extremity. 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 Jefm Bryant. Other than that she is doing well 10/31/2015 -- she is here for a third application of Apligraf to her left lower extremity. after reviewing her wound on the right side I noted that it is granulating extremely well and we will use the remnants of the Apligraf on the right leg. 11/14/2015 -- she was recently admitted to the hospital between January 11 and 11/10/2015 for uncontrolled hypertension, diabetes mellitus, acute kidney injury. during her admission she was supported by wound care and also by antibiotics.  Around this time her immunosuppression was stopped by Dr. Nunzio Cory. She is feeling much better now. 11/21/2015 -- she has had her fourth application of Apligraf today and it has been shared between the left and the right leg 11/24/2015 -- was here for a wound VAC change but it was noticed that she had profuse bleeding from the lobe medial part of her right lower extremity and I was called in to evaluate her. 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 She has been doing fine since then 12/19/2015 -- she has finished 5 applications of Apligraf to the left lower extremity and this is doing great. He  began with pain and swelling of her right ankle a couple of days ago and it has caused her much distress. She does not have any fever or any change in her general health. 12/22/2015 -- the patient's pain and swelling of the right lower extremity has resolved over the weekend. The left lower extremity is completely healed. She has had the first application of Apligraf on the right leg today. 01/02/2016 -- She is here for the second application of Apligraf on the right leg today. Objective Constitutional Pulse regular. Respirations normal and unlabored. Afebrile. Vitals Time Taken: 2:13 PM, Height: 65 in, Weight: 248 lbs, BMI: 41.3, Temperature: 97.8 F, Pulse: 79 bpm, Respiratory Rate: 18 breaths/min, Blood Pressure: 152/55 mmHg. Eyes Nonicteric. Reactive to light. Ears, Nose, Mouth, and Throat Lips, teeth, and gums WNL.Marland Kitchen Moist mucosa without lesions. Cadena, Samatha D. ([id number]) Neck supple and nontender. No palpable supraclavicular or cervical adenopathy. Normal sized without goiter. Respiratory WNL. No retractions.. Cardiovascular Pedal Pulses WNL. No clubbing, cyanosis or edema. Lymphatic No adneopathy. No adenopathy. No adenopathy. Musculoskeletal Adexa without tenderness or enlargement.. Digits  and nails w/o clubbing, cyanosis, infection, petechiae, ischemia, or inflammatory conditions.Marland Kitchen Psychiatric Judgement and insight Intact.. No evidence of depression, anxiety, or agitation.. General Notes: the left leg is completely healed. The right anterior ulcer continues to have some slough in the anterior lateral part and this is too tender to debride and hence we will use Santyl ointment. The posterior lateral part of the right leg has clean granulation tissue and after freshening this up with moist saline gauze will apply the second application of Apligraf. Integumentary (Hair, Skin) No suspicious lesions. No crepitus or fluctuance. No peri-wound warmth or erythema. No masses.. Wound #5 status is Open. Original cause of wound was Gradually Appeared. The wound is located on the Right,Posterior Lower Leg. The wound measures 5.2cm length x 5.8cm width x 0.1cm depth; 23.688cm^2 area and 2.369cm^3 volume. The wound is limited to skin breakdown. There is no tunneling or undermining noted. There is a large amount of serous drainage noted. The wound margin is flat and intact. There is large (67-100%) red granulation within the wound bed. There is a small (1-33%) amount of necrotic tissue within the wound bed including Adherent Slough. The periwound skin appearance exhibited: Moist. The periwound skin appearance did not exhibit: Callus, Crepitus, Excoriation, Fluctuance, Friable, Induration, Localized Edema, Rash, Scarring, Dry/Scaly, Maceration, Atrophie Blanche, Cyanosis, Ecchymosis, Hemosiderin Staining, Mottled, Pallor, Rubor, Erythema. The periwound has tenderness on palpation. Wound #6 status is Open. Original cause of wound was Gradually Appeared. The wound is located on the Right,Medial Lower Leg. The wound measures 6.2cm length x 4.2cm width x 0.2cm depth; 20.452cm^2 area and 4.09cm^3 volume. The wound is limited to skin breakdown. There is no tunneling or undermining noted. There is a  medium amount of purulent drainage noted. The wound margin is distinct with the outline attached to the wound base. There is large (67-100%) red granulation within the wound bed. There is a small (1-33%) amount of necrotic tissue within the wound bed including Eschar and Adherent Slough. The periwound skin appearance exhibited: Moist. The periwound skin appearance did not exhibit: Callus, Crepitus, Excoriation, Fluctuance, Friable, Induration, Localized Edema, Rash, Scarring, Dry/Scaly, Maceration, Atrophie Blanche, Cyanosis, Ecchymosis, Hemosiderin Staining, Mottled, Pallor, Rubor, Erythema. Periwound temperature was noted as No Abnormality. The periwound has tenderness on palpation. 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 A skin graft procedure using a bioengineered skin substitute/cellular or tissue based product was performed by [name], MD. Apligraf was applied and secured with Steri-Strips. 31 sq cm of product was utilized and 13 sq cm was wasted due to wound size. Post Application, mepitel one was applied. A Time Out was conducted prior to the start of the procedure. The procedure was tolerated well with a pain level of 0 throughout and a pain level of 0 following the procedure. Post procedure Diagnosis Wound #5: Same as Pre-Procedure . Plan Wound Cleansing: Wound #5 Right,Posterior Lower Leg: Cleanse wound with mild soap and water Wound #6 Right,Medial Lower Leg: Cleanse wound with mild soap and water Anesthetic: Wound #5 Right,Posterior Lower  Leg: Topical Lidocaine 4% cream applied to wound bed prior to debridement Wound #6 Right,Medial Lower Leg: Topical Lidocaine 4% cream applied to wound bed prior to debridement Skin Barriers/Peri-Wound Care: Wound #5 Right,Posterior Lower Leg: Skin Prep Seibert, Cay D. ([id number]) Wound #6 Right,Medial Lower Leg: Skin Prep Primary Wound Dressing: Wound #5 Right,Posterior Lower Leg: Dry Gauze Boardered Foam Dressing Mepitel One - steri-strips Other: - Apligraph Wound #6 Right,Medial Lower Leg: Dry Gauze Santyl Ointment Boardered Foam Dressing Dressing Change Frequency: Wound #5 Right,Posterior Lower Leg: Change dressing every week Wound #6 Right,Medial Lower Leg: Change dressing every other day. Follow-up Appointments: Wound #5 Right,Posterior Lower Leg: Return Appointment in 1 week. Wound #6 Right,Medial Lower Leg: Return Appointment in 1 week. Edema Control: Wound #5 Right,Posterior Lower Leg: Elevate legs to the level of the heart and pump ankles as often as possible Other: - wear compression hose Wound #6 Right,Medial Lower Leg: Elevate legs to the level of the heart and pump ankles as often as possible Other: - wear compression hose The left leg is completely healed and we will continue with compression stockings. Right leg medial part will be with Santyl ointment on a daily basis and the Apligraf area is to be protected well. We will also continue with compression stockings on this leg. She will be back to see me next week for her wound check of the second application of Apligraf. Electronic Signature(s) Signed: 01/02/2016 3:08:44 PM By: [name], [name] Entered By: [name] on 01/02/2016 15:08:44 Roddey, Ahtziri D. ([id number]) -------------------------------------------------------------------------------- SuperBill Details Patient Name: TAYLORRAE, QUALLEY D. Date of Service: 01/02/2016 Medical Record Number: [id number] Patient Account Number: [id number] Date of  Birth/Sex: [birth year] (70 y.o. Female) Treating RN: [name] Primary Care Physician: Glendon Axe Other Clinician: Referring Physician: Glendon Axe Treating Physician/Extender: Frann Rider in Treatment: 48 Diagnosis Coding ICD-10 Codes Code Description E11.622 Type 2 diabetes mellitus with other skin ulcer E66.01 Morbid (severe) obesity due to excess calories I89.0 Lymphedema, not elsewhere classified L97.212 Non-pressure chronic ulcer of right calf with fat layer exposed I87.311 Chronic venous hypertension (idiopathic) with ulcer of right lower extremity Facility Procedures CPT4 Code Description: [id number] (Facility Use Only) Apligraf 1 SQ CM Modifier: Quantity: 9 CPT4 Code Description: [id number] - SKIN SUB GRAFT FACE/NK/HF/G ICD-10 Description Diagnosis E11.622 Type 2 diabetes mellitus with other skin ulcer L97.212 Non-pressure chronic ulcer of right calf with fat la I87.311 Chronic venous hypertension  (idiopathic) with ulcer Modifier: yer exposed of right lower Quantity:  1 extremity CPT4 Code Description: [id number] - SKIN SUB GRAFT F/N/HF/G ADDL ICD-10 Description Diagnosis E11.622 Type 2 diabetes mellitus with other skin ulcer I89.0 Lymphedema, not elsewhere classified L97.212 Non-pressure chronic ulcer of right calf with fat  la I87.311 Chronic venous hypertension (idiopathic) with ulcer Modifier: yer exposed of right lower Quantity: 1 extremity Physician Procedures CPT4 Code Description: [id number] - WC PHYS SKIN SUB GRAFT FACE/NK/HF/G ICD-10 Description Diagnosis E11.622 Type 2 diabetes mellitus with other skin ulcer L97.212 Non-pressure chronic ulcer of right calf with fat lay LARHONDA, SERVELLO ([id number]) Modifier: er exposed Quantity: 1 Electronic Signature(s) Signed: 01/02/2016 3:09:09 PM By: [name], [name] Entered By: [name] on 01/02/2016 15:09:09

## 2016-01-09 ENCOUNTER — Encounter: Payer: Commercial Managed Care - HMO | Admitting: Surgery

## 2016-01-09 DIAGNOSIS — E11622 Type 2 diabetes mellitus with other skin ulcer: Secondary | ICD-10-CM | POA: Diagnosis not present

## 2016-01-13 NOTE — Progress Notes (Signed)
KENSLIE, LOMBARDI ([id number]) Visit Report for 01/09/2016 Arrival Information Details Patient Name: [name], [name] Date of Service: 01/09/2016 2:00 PM Medical Record Number: [id number] Patient Account Number: [id number] Date of Birth/Sex: Jul 18, 1946 (70 y.o. Female) Treating RN: [name] Primary Care Physician: Glendon Axe Other Clinician: Referring Physician: Glendon Axe Treating Physician/Extender: Frann Rider in Treatment: 30 Visit Information History Since Last Visit Added or deleted any medications: No Patient Arrived: Cane Any new allergies or adverse reactions: No Arrival Time: 14:03 Had a fall or experienced change in No Accompanied By: self activities of daily living that may affect Transfer Assistance: None risk of falls: Patient Identification Verified: Yes Signs or symptoms of abuse/neglect since last No Secondary Verification Process Yes visito Completed: Hospitalized since last visit: No Patient Requires Transmission- No Pain Present Now: No Based Precautions: Patient Has Alerts: Yes Patient Alerts: Patient on Blood Thinner Electronic Signature(s) Signed: 01/12/2016 5:01:01 PM By: [name] Entered By: [name] on 01/09/2016 14:03:55 [name], [name] ([id number]) -------------------------------------------------------------------------------- Encounter Discharge Information Details Patient Name: [name], [name] Date of Service: 01/09/2016 2:00 PM Medical Record Number: [id number] Patient Account Number: [id number] Date of Birth/Sex: 06-Apr-1946 (70 y.o. Female) Treating RN: [name] Primary Care Physician: Glendon Axe Other Clinician: Referring Physician: Glendon Axe Treating Physician/Extender: Frann Rider in Treatment: 68 Encounter Discharge Information Items Discharge Pain Level: 0 Discharge Condition: Stable Ambulatory Status: Cane Discharge Destination: Home Private Transportation: Auto Accompanied  By: self Schedule Follow-up Appointment: Yes Medication Reconciliation completed and No provided to Patient/Care Crystol Walpole: Clinical Summary of Care: Electronic Signature(s) Signed: 01/09/2016 3:11:53 PM By: [name] Entered By: [name] on 01/09/2016 15:11:53 [name], [name] ([id number]) -------------------------------------------------------------------------------- Lower Extremity Assessment Details Patient Name: Kristin Stare D. Date of Service: 01/09/2016 2:00 PM Medical Record Number: [id number] Patient Account Number: [id number] Date of Birth/Sex: 18-Oct-1946 (70 y.o. Female) Treating RN: [name] Primary Care Physician: Glendon Axe Other Clinician: Referring Physician: Glendon Axe Treating Physician/Extender: Frann Rider in Treatment: 49 Edema Assessment Assessed: [Left: No] [Right: No] Edema: [Left: Ye] [Right: s] Vascular Assessment Pulses: Posterior Tibial Dorsalis Pedis Palpable: [Right:Yes] Extremity colors, hair growth, and conditions: Extremity Color: [Right:Hyperpigmented] Hair Growth on Extremity: [Right:No] Temperature of Extremity: [Right:Warm] Capillary Refill: [Right:< 3 seconds] Electronic Signature(s) Signed: 01/12/2016 5:01:01 PM By: [name] Entered By: [name] on 01/09/2016 14:16:48 [name], [name] ([id number]) -------------------------------------------------------------------------------- Multi Wound Chart Details Patient Name: Kristin Stare D. Date of Service: 01/09/2016 2:00 PM Medical Record Number: [id number] Patient Account Number: [id number] Date of Birth/Sex: [birth year] (70 y.o. 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] [N/A:N/A] Comorbid History: [5:Cataracts, Asthma, Coronary Artery Disease, Coronary Artery Disease, Hypertension, Type II Diabetes, Osteoarthritis, Diabetes, Osteoarthritis, Neuropathy] [6:Cataracts, Asthma, Hypertension, Type II Neuropathy] [N/A:N/A] Date Acquired: [5:04/29/2015] [6:10/10/2015] [N/A:N/A] Weeks of Treatment: [5:33] [6:13] [N/A:N/A] Wound Status: [5:Open] [6:Open] [N/A:N/A] Measurements L x W x D 5.2x5.8x0.1 [6:6.2x4.1x0.2] [N/A:N/A] (cm) Area (cm) : [5:23.688] [6:19.965] [N/A:N/A] Volume (cm) : [5:2.369] [6:3.993] [N/A:N/A] % Reduction in Area: [5:-12500.00%] [6:-477.70%] [N/A:N/A] % Reduction in Volume: -12368.40% [6:-1054.00%] [N/A:N/A] Classification: [5:Full Thickness  Without Exposed Support Structures] [6:Grade 1] [N/A:N/A] HBO Classification: [5:Grade 1] [6:N/A] [N/A:N/A] Exudate Amount: [5:Large] [6:Medium] [N/A:N/A] Exudate Type: [5:Serous] [6:Purulent] [N/A:N/A] Exudate Color: [5:amber] [6:yellow, brown, green] [N/A:N/A] Wound Margin: [5:Flat and Intact] [6:Distinct, outline attached] [N/A:N/A] Granulation Amount: [5:Large (67-100%)] [6:Large (67-100%)] [N/A:N/A] Granulation Quality: [5:Red, Hyper-granulation] [6:Red] [N/A:N/A] Necrotic Amount: Small (1-33%) Small (1-33%) N/A Necrotic Tissue: Adherent Slough Eschar, Adherent Slough N/A Exposed Structures: Fascia: No Fascia: No N/A Fat: No Fat: No Tendon: No Tendon: No Muscle: No Muscle: No Joint: No Joint: No Bone: No Bone: No Limited to Skin Limited to Skin Breakdown Breakdown Epithelialization: None None N/A Periwound Skin Texture: Edema: No Edema: No  N/A Excoriation: No Excoriation: No Induration: No Induration: No Callus: No Callus: No Crepitus: No Crepitus: No Fluctuance: No Fluctuance: No Friable: No Friable: No Rash: No Rash: No Scarring: No Scarring: No Periwound Skin Moist: Yes Moist: Yes N/A Moisture: Maceration: No Maceration: No Dry/Scaly: No Dry/Scaly: No Periwound Skin Color: Atrophie Blanche: No Atrophie Blanche: No N/A Cyanosis: No Cyanosis: No Ecchymosis: No Ecchymosis: No Erythema: No Erythema: No Hemosiderin Staining: No Hemosiderin Staining: No Mottled: No Mottled: No Pallor: No Pallor: No Rubor: No Rubor: No Temperature: N/A No Abnormality N/A Tenderness on Yes Yes N/A Palpation: Wound Preparation: Ulcer Cleansing: Ulcer Cleansing: N/A Rinsed/Irrigated with Rinsed/Irrigated with Saline Saline Topical Anesthetic Topical Anesthetic Applied: Other: lidocaine Applied: Other: lidocaine 4% 4% Treatment Notes Electronic Signature(s) Signed: 01/12/2016 5:01:01 PM By: [name] Entered By: [name] on 01/09/2016 14:17:12 [name], [name] ([id number]) -------------------------------------------------------------------------------- Multi-Disciplinary Care Plan Details Patient Name: GRACIELLA, CREMER D. Date of Service: 01/09/2016 2:00 PM Medical Record Number: [id number] Patient Account Number: [id number] Date of Birth/Sex: 1946/01/02 (70 y.o. Female) Treating RN: [name] Primary Care Physician: Glendon Axe Other Clinician: Referring Physician: Glendon Axe Treating Physician/Extender: Frann Rider in Treatment: 45 Active Inactive Orientation to the Wound Care Program Nursing Diagnoses: Knowledge deficit related to the wound healing center program Goals: Patient/caregiver will verbalize understanding of the [facility] Date Initiated: 01/31/2015 Goal Status: Active Interventions: Provide education on orientation to the wound  center Notes: Venous Leg Ulcer Nursing Diagnoses: Potential for venous Insuffiency (use before diagnosis confirmed) Goals: Non-invasive venous studies are completed as ordered Date Initiated: 01/31/2015 Goal Status: Active Patient/caregiver will verbalize understanding of disease process and disease management Date Initiated: 01/31/2015 Goal Status: Active Interventions: Assess peripheral edema status every visit. Notes: Wound/Skin Impairment Nursing Diagnoses: Impaired tissue integrity Knowledge deficit related to smoking impact on wound healing [name], [name] ([id number]) Goals: Patient/caregiver will verbalize understanding of skin care regimen Date Initiated: 01/31/2015 Goal Status: Active Ulcer/skin breakdown will heal within 14 weeks Date Initiated: 01/31/2015 Goal Status: Active Interventions: Assess ulceration(s) every visit Notes: Electronic Signature(s) Signed: 01/12/2016 5:01:01 PM By: [name] Entered By: [name] on 01/09/2016 14:17:04 [name], [name] ([id number]) -------------------------------------------------------------------------------- Patient/Caregiver Education Details Patient Name: Kristin Stare D. Date of Service: 01/09/2016 2:00 PM Medical Record Number: [id number] Patient Account Number: [id number] Date of Birth/Gender: February 02, 1946 (70 y.o. Female) Treating RN: [name] Primary Care Physician: Glendon Axe Other Clinician: Referring Physician: Glendon Axe Treating Physician/Extender: Frann Rider in Treatment: 73 Education Assessment Education Provided To: Patient Education Topics Provided Basic Hygiene: Handouts: Other: shower with protection Methods: Explain/Verbal Responses: State content correctly Electronic Signature(s) Signed: 01/09/2016 3:12:10 PM By: [name] Entered By: [name] on 01/09/2016 15:12:10 [name], [name]  ([id number]) -------------------------------------------------------------------------------- Wound Assessment Details Patient Name: [name], [name] Date of Service: 01/09/2016 2:00 PM Medical Record  Number: [id number] Patient Account Number: [id number] Date of Birth/Sex: 11/24/1945 (70 y.o. Female) Treating RN: [name] Primary Care Physician: Glendon Axe Other Clinician: Referring Physician: Glendon Axe Treating Physician/Extender: Frann Rider in Treatment: 16 Wound Status Wound Number: 5 Primary Venous Leg Ulcer Etiology: Wound Location: Right Lower Leg - Posterior Wound Open Wounding Event: Gradually Appeared Status: Date Acquired: 04/29/2015 Comorbid Cataracts, Asthma, Coronary Artery Weeks Of Treatment: 33 History: Disease, Hypertension, Type II Clustered Wound: No Diabetes, Osteoarthritis, Neuropathy Photos Photo Uploaded By: [name] on 01/09/2016 16:51:23 Wound Measurements Length: (cm) 5.2 Width: (cm) 5.8 Depth: (cm) 0.1 Area: (cm) 23.688 Volume: (cm) 2.369 % Reduction in Area: -12500% % Reduction in Volume: -12368.4% Epithelialization: None Tunneling: No Undermining: No Wound Description Full Thickness Without Classification: Exposed Support Structures Diabetic Severity Grade 1 (Wagner): Wound Margin: Flat and Intact Exudate Amount: Large Exudate Type: Serous Exudate Color: amber Foul Odor After Cleansing: No Wound Bed Granulation Amount: Large (67-100%) Exposed Structure [name], [name] ([id number]) Granulation Quality: Red, Hyper-granulation Fascia Exposed: No Necrotic Amount: Small (1-33%) Fat Layer Exposed: No Necrotic Quality: Adherent Slough Tendon Exposed: No Muscle Exposed: No Joint Exposed: No Bone Exposed: No Limited to Skin Breakdown Periwound Skin Texture Texture Color No Abnormalities Noted: No No Abnormalities Noted: No Callus: No Atrophie Blanche: No Crepitus: No Cyanosis: No Excoriation:  No Ecchymosis: No Fluctuance: No Erythema: No Friable: No Hemosiderin Staining: No Induration: No Mottled: No Localized Edema: No Pallor: No Rash: No Rubor: No Scarring: No Temperature / Pain Moisture Tenderness on Palpation: Yes No Abnormalities Noted: No Dry / Scaly: No Maceration: No Moist: Yes Wound Preparation Ulcer Cleansing: Rinsed/Irrigated with Saline Topical Anesthetic Applied: Other: lidocaine 4%, Treatment Notes Wound #5 (Right, Posterior Lower Leg) 1. Cleansed with: Clean wound with Normal Saline 4. Dressing Applied: Other dressing (specify in notes) 5. Secondary Dressing Applied Bordered Foam Dressing Notes drawtex Electronic Signature(s) Signed: 01/12/2016 5:01:01 PM By: [name] Entered By: [name] on 01/09/2016 14:16:09 [name], [name] ([id number]) -------------------------------------------------------------------------------- Wound Assessment Details Patient Name: [name], [name] Date of Service: 01/09/2016 2:00 PM Medical Record Number: [id number] Patient Account Number: [id number] Date of Birth/Sex: Mar 17, 1946 (70 y.o. Female) Treating RN: [name] Primary Care Physician: Glendon Axe Other Clinician: Referring Physician: Glendon Axe Treating Physician/Extender: Frann Rider in Treatment: 3 Wound Status Wound Number: 6 Primary Diabetic Wound/Ulcer of the Lower Etiology: Extremity Wound Location: Right Lower Leg - Medial Wound Open Wounding Event: Gradually Appeared Status: Date Acquired: 10/10/2015 Comorbid Cataracts, Asthma, Coronary Artery Weeks Of Treatment: 13 History: Disease, Hypertension, Type II Clustered Wound: No Diabetes, Osteoarthritis, Neuropathy Photos Photo Uploaded By: [name] on 01/09/2016 16:51:24 Wound Measurements Length: (cm) 6.2 Width: (cm) 4.1 Depth: (cm) 0.2 Area: (cm) 19.965 Volume: (cm) 3.993 % Reduction in Area: -477.7% % Reduction in Volume:  -1054% Epithelialization: None Tunneling: No Undermining: No Wound Description Classification: Grade 1 Wound Margin: Distinct, outline attached Exudate Amount: Medium Exudate Type: Purulent Exudate Color: yellow, brown, green Foul Odor After Cleansing: No Wound Bed Granulation Amount: Large (67-100%) Exposed Structure Granulation Quality: Red Fascia Exposed: No Necrotic Amount: Small (1-33%) Fat Layer Exposed: No Necrotic Quality: Eschar, Adherent Slough Tendon Exposed: No [name], [name] D. ([id number]) Muscle Exposed: No Joint Exposed: No Bone Exposed: No Limited to Skin Breakdown Periwound Skin Texture Texture Color No Abnormalities Noted: No No Abnormalities Noted: No Callus: No Atrophie Blanche: No Crepitus: No Cyanosis: No Excoriation: No Ecchymosis: No Fluctuance: No Erythema: No Friable: No Hemosiderin Staining: No Induration: No Mottled: No  Localized Edema: No Pallor: No Rash: No Rubor: No Scarring: No Temperature / Pain Moisture Temperature: No Abnormality No Abnormalities Noted: No Tenderness on Palpation: Yes Dry / Scaly: No Maceration: No Moist: Yes Wound Preparation Ulcer Cleansing: Rinsed/Irrigated with Saline Topical Anesthetic Applied: Other: lidocaine 4%, Treatment Notes Wound #6 (Right, Medial Lower Leg) 1. Cleansed with: Clean wound with Normal Saline 2. Anesthetic Topical Lidocaine 4% cream to wound bed prior to debridement 4. Dressing Applied: Santyl Ointment 5. Secondary Dressing Applied Bordered Foam Dressing Dry Gauze Electronic Signature(s) Signed: 01/12/2016 5:01:01 PM By: [name] Entered By: [name] on 01/09/2016 14:16:24 Incorvaia, Taiwana D. ([id number]) -------------------------------------------------------------------------------- Vitals Details Patient Name: Kristin Stare D. Date of Service: 01/09/2016 2:00 PM Medical Record Number: [id number] Patient Account Number: [id number] Date of Birth/Sex: 1946-04-04  (70 y.o. Female) Treating RN: [name] Primary Care Physician: Glendon Axe Other Clinician: Referring Physician: Glendon Axe Treating Physician/Extender: Frann Rider in Treatment: 74 Vital Signs Time Taken: 14:05 Temperature (F): 98.1 Height (in): 65 Pulse (bpm): 81 Weight (lbs): 248 Respiratory Rate (breaths/min): 18 Body Mass Index (BMI): 41.3 Blood Pressure (mmHg): 156/67 Reference Range: 80 - 120 mg / dl Electronic Signature(s) Signed: 01/12/2016 5:01:01 PM By: [name] Entered By: [name] on 01/09/2016 14:07:00

## 2016-01-13 NOTE — Progress Notes (Signed)
AOIBHEANN, HEESCH ([id number]) Visit Report for 01/09/2016 Chief Complaint Document Details Patient Name: [name], [name] 01/09/2016 2:00 Date of Service: PM Medical Record [id number] Number: Patient Account Number: [id number] 1946-03-14 (70 y.o. Treating RN: [name] Date of Birth/Sex: Female) Other Clinician: Primary Care Physician: [facility], Delana Meyer Treating [name] Referring Physician: Glendon Axe Physician/Extender: Weeks in Treatment: 82 Information Obtained from: Patient Chief Complaint Patient presents to the wound care center for a consult due non healing wound 70 year old patient comes with a history of having a ulcer on the left lower extremity for the past 4 weeks. she says she's had swelling of both lower extremities for about a year after she started having prednisone. 02/07/2015 -- her vascular appointments obtained were in the first and third week of June. she is able to go to [place] and we will try and get her some earlier appointments. Other than that nothing else has changed in her management. Electronic Signature(s) Signed: 01/09/2016 2:46:56 PM By: [name], [name] Entered By: [name] on 01/09/2016 14:46:56 Hoey, BRIEA MULLINAX ([id number]) -------------------------------------------------------------------------------- Debridement Details Patient Name: [name], [name] 01/09/2016 2:00 Date of Service: PM Medical Record [id number] Number: Patient Account Number: [id number] [birth year] (70 y.o. Treating RN: [name] Date of Birth/Sex: Female) Other Clinician: Primary Care Physician: [facility], Delana Meyer Treating Wessley Emert Referring Physician: Glendon Axe Physician/Extender: Weeks in Treatment: 49 Debridement Performed for Wound #6 Right,Medial Lower Leg Assessment: Performed By: Physician [name], MD Debridement: Debridement Pre-procedure Yes Verification/Time Out Taken: Start Time: 14:30 Pain Control: Lidocaine 4%  Topical Solution Level: Skin/Subcutaneous Tissue Total Area Debrided (L x 4 (cm) x 4 (cm) = 16 (cm) W): Tissue and other Viable, Non-Viable, Fibrin/Slough, Subcutaneous material debrided: Instrument: Curette Bleeding: Minimum Hemostasis Achieved: Pressure End Time: 14:32 Procedural Pain: 0 Post Procedural Pain: 0 Response to Treatment: Procedure was tolerated well Post Debridement Measurements of Total Wound Length: (cm) 6.2 Width: (cm) 4.1 Depth: (cm) 0.2 Volume: (cm) 3.993 Post Procedure Diagnosis Same as Pre-procedure Electronic Signature(s) Signed: 01/09/2016 2:46:50 PM By: [name], [name] Signed: 01/12/2016 5:01:01 PM By: [name] Entered By: [name] on 01/09/2016 14:46:50 Earll, Roxann DMarland Kitchen ([id number]) -------------------------------------------------------------------------------- HPI Details Patient Name: [name], [name] 01/09/2016 2:00 Date of Service: PM Medical Record [id number] Number: Patient Account Number: [id number] April 20, 1946 (70 y.o. Treating RN: [name] Date of Birth/Sex: Female) Other Clinician: Primary Care Physician: [facility], Delana Meyer Treating Alyssa Rotondo Referring Physician: Glendon Axe Physician/Extender: Weeks in Treatment: 78 History of Present Illness HPI Description: 70 year old patient who is known to have diabetes mellitus type 2, chronic renal insufficiency, coronary artery disease, hypertension, hypercholesterolemia, temporal arteritis and inflammatory arthritiss also has a history of having a hysterectomy and some orthopedic related surgeries. The ulcer on the left lower extremity started off as a blister and then. Got progressively worse. She does not have any fever or chills and has not had any recent surgical intervention for this. Her last hemoglobin A1c was 10.1 in September 2015. She has been recently put on doxycycline by her PCP. She is now also allergic to doxycycline and was this was changed over to  Keflex. 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 She does see a cardiologist and also takes a diuretic. 02/07/2015 her arterial and venous duplex studies to be done have dates been given as the first and third week of June. This is at [facility].  We are going to try and get early appointments at [facility]. other than that nothing has changed in her management. 02/14/2015 -- we have been able to get her an appointment in [facility] on May 20 which is much earlier than her previous ones at [place]. She continues with her prednisone and her sugars are in the range of 150-200. 02/21/2015 We were able to get a vascular lab workup for her today and she is going to be there at 2:00 this afternoon. the swelling of her leg has gone down significantly but she still has some tenderness over the wounds. 02/28/2015 - She has had one of two vascular workups done, and this coming Tuesday has another, at [facility]. She continues to be on steroid medications. She has significant sensitivity in her left lower extremity and has pain suggestive of neuropathic pain and I have asked her to address this with her primary care physician. 03/07/2015 -- The patient saw Dr. Lucky Cowboy for a consultation and he has had her arteries are okay but she has 2 incompetent veins on the left lower extremity and he is going to set her up for surgery. Official report is awaited. Addendum: Official reports are now available and on 03/04/2015. She was seen and lower extremity venous duplex exam was done. There was reflux present within the left greater saphenous vein below the knee and also the left small saphenous vein. Arterial duplex showed normal triphasic waveforms throughout the left lower extremity without any significant stenosis. Her ABIs were noncompressible bilaterally but a waveforms  were normal and a digital pressures were normal bilaterally consistent with no significant arterial insufficiency. He has recommended endovenous ablation of both the left small saphenous and the left great saphenous vein. This would still be scheduled later. 03/14/2015 -- she has heard back from the vascular office and has surgery scheduled for sometime in July. JEZABEL, SHAHEED ([id number]) Her rheumatologist has decreased her prednisone dosage but she still on it. She has also had cataract surgery in her right eye recently this week. 03/20/2015 - No new complaints today. Pain improved. No fever or chills. Tolerating 2 layer compression. 04/14/2015 -- she was doing very well today she went off on vacation and now her edema has increased markedly the ulceration is bigger and her diabetes is not under control. 04/21/2015 -- I spoke to her PCP Dr. Candiss Norse and discussed the management which would include being seen by a general surgeon for debridement and taking multiple punch biopsies which would help in establishing the diagnosis of this is a vasculitis. She is agreeable about this and will set her up for the procedure with Dr. Tamala Julian at [facility]. She was seen by the surgeon Dr. Jamal Collin. 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 She is scheduled to have laser ablation done next week.Pt was sent here for possible biopsy to look for vasculitis. Feel it would be better to wait after laser ablation is completed- the ulcer may heal fully and biopsy may not be necessary 04/29/2015 -- she had the venous ablation done by Dr. Lucky Cowboy last Friday and we do not have any notes yet. She is doing fine otherwise. 05/06/2015 --Review of her recent vascular intervention shows that she was seen by Dr. Lucky Cowboy on 04/29/2015. The follow-up duplex which was done showed that both the great saphenous vein and  the small saphenous vein remained patent with reflux consistent  with an unsuccessful ablation. He has rescheduled her for another the endovenous ablation to be done in about 4 weekso time. 05/13/2015 -- he was seen by her surgeon Dr. Jamal Collin who asked her to continue with conservative therapy and he would speak to Dr. Lucky Cowboy about her management. Dr. Lucky Cowboy is going to schedule her surgery in the middle of August for a repeat endovenous ablation. Her pus culture from last week has grown : [place] her noted her sensitivity report but due to her multiple allergies I had tried clindamycin and she developed a rash with this too. She has been prescribed and anti-buttocks in the ER and is has it at home and she will let is know what she is going to be taking. 05/20/2015 -- she has developed a small spot on her right lower extremity but besides that it is not a full fledged ulceration. She did not get to see Dr. Lucky Cowboy last week and hopefully she will see him in the near future. 05/27/2015 -she is still awaiting her appointment with Dr.Dew and her vascular procedure is not scheduled until August 19. She will be seeing her PCP tomorrow and I have asked her to convey our discussion so that she is aware that debridement has not been done yet. 06/03/2015 -- was seen by her rheumatologist Dr. 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 He is lowering her prednisone to 12-1/2 mg for 1 month and then 10 mg per the next month. I will again make an attempt to speak to her PCP Dr. Candiss Norse and her surgeon Dr. 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 06/17/2015 -- Dr.Dew did her vascular procedure last week and a  follow-up venous ultrasound shows good resolution of the veins as per the patient's history. He is to see her back in 2 weeks. MARIJOSE, LEMELLE ([id number]) 07/07/2015. -- the patient has had a heavy growth of Proteus mirabilis and Enterococcus faecalis. These are sensitive to several drugs but the problem is she has allergies to all of these and hence I would like her to see Dr. Ola Spurr for this. She is also due to see Dr. Lucky Cowboy tomorrow and I will discussed the management with him including debridement under anesthesia and possible biopsies. 07/14/2015 -- she has an appointment to see Dr. Ola Spurr tomorrow and did see Dr. Bunnie Domino PA who will discuss my request with him. 07/21/2015 -- saw Dr. Ola Spurr was able to do a test on her and has put her on amoxicillin. She has been tolerating that and has had no problems with allergies to this. 07/28/2015 -- Last Friday I spoke to Dr. Leotis Pain regarding her care and he said that her right leg did not need any surgery and on the left leg was doing pretty good. We did agree that if she undergoes any procedure in the future he would do a couple of punch biopsies of the wound. 08/18/2015 -- her right leg is very tender and there is significant amount of slough. The left leg is looking much better 09/02/2015 -- she is going to have a debridement and punch biopsies of her right lower extremity by Dr. Leotis Pain this coming Thursday. Also seen Dr. Ola Spurr who has continued her on ciprofloxacin. 09/09/2015 --  on 09/04/2015 Dr. Leotis Pain took her to surgery - Irrigation and excisional debridement of skin, soft tissue, and muscle to about 40 cm2 to the right posterior calf with biopsy. Pathology results are -- DIAGNOSIS: SKIN, RIGHT LOWER EXTREMITY; BIOPSY: SKIN AND SOFT TISSUE WITH ULCERATION, SEE NOTE. - NEGATIVE FOR DYSPLASIA AND MALIGNANCY. Note: There is acute inflammation and ulceration of the epidermis. The dermis shows nonspecific inflammation,  neovascularization, and hemosiderin deposition. The differential diagnosis for these findings includes stasis dermatitis, nonspecific dermatitis, and infection. Correlation with clinical findings is required. A PAS fungal stain is obtained and results will be reported in an addendum. cultures were also sent and this grew Pseudomonas aeruginosa, Escherichia coli, Proteus mirabilis, Klebsiella pneumoniae and they were all sensitive to ciprofloxacin which she is on. 09/16/2015 -- he saw Dr. 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 He is going to call the patient in and discuss with her the management possibly with Imuran. I will continue treating her locally. 09/23/2015 -- she saw Dr. Ola Spurr today who was going to continue the antibiotics for now and stop after this course. She has an appointment to see Dr. Jefm Bryant in about a week's time. Her wound VAC has arrived but she did not bring it with her today. We will try and set her up for changes to the right leg 3 times a week. She is here for her first application of Apligraf to the left lower extremity. 09/30/2015 -- she has seen Dr. 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 because she is ambulatory at home health will not apply a wound VAC and she will have to come here 3 times a week on Monday Wednesday and Friday. 10/10/2015 -- she is here for a second application of Apligraf to her left lower extremity. 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 Jefm Bryant. Other than that she is doing well 10/31/2015 -- she is here for a third application of Apligraf to her left lower extremity. after reviewing her wound on the right side I noted that it is granulating extremely  well and we will use the remnants of the Apligraf on the right leg. DANNELY, HENDERSON ([id number]) 11/14/2015 -- she was recently admitted to the hospital between January 11 and 11/10/2015 for uncontrolled hypertension, diabetes mellitus, acute kidney injury. during her admission she was supported by wound care and also by antibiotics. Around this time her immunosuppression was stopped by Dr. Nunzio Cory. She is feeling much better now. 11/21/2015 -- she has had her fourth application of Apligraf today and it has been shared between the left and the right leg 11/24/2015 -- was here for a wound VAC change but it was noticed that she had profuse bleeding from the lobe medial part of her right lower extremity and I was called in to evaluate her. 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 She has been doing fine since then 12/19/2015 -- she has finished 5 applications of Apligraf to the left lower extremity and this is doing great. He began with pain and swelling of her right ankle a couple of days ago and it has caused her much distress. She does not have any  fever or any change in her general health. 12/22/2015 -- the patient's pain and swelling of the right lower extremity has resolved over the weekend. The left lower extremity is completely healed. She has had the first application of Apligraf on the right leg today. 01/02/2016 -- She is here for the second application of Apligraf on the right leg today. Electronic Signature(s) Signed: 01/09/2016 2:47:00 PM By: [name], [name] Entered By: [name] on 01/09/2016 14:47:00 Lorson, DILLYN NARDIN ([id number]) -------------------------------------------------------------------------------- Physical Exam Details Patient Name: KARYZMA, MUEHL D. 01/09/2016 2:00 Date of Service: PM Medical Record [id number] Number: Patient Account Number: [id number] Aug 20, 1946  (70 y.o. Treating RN: [name] Date of Birth/Sex: Female) Other Clinician: Primary Care Physician: [facility], Delana Meyer Treating [name] Referring Physician: Glendon Axe Physician/Extender: Weeks in Treatment: 49 Constitutional . Pulse regular. Respirations normal and unlabored. Afebrile. . Eyes Nonicteric. Reactive to light. Ears, Nose, Mouth, and Throat Lips, teeth, and gums WNL.Marland Kitchen Moist mucosa without lesions. Neck supple and nontender. No palpable supraclavicular or cervical adenopathy. Normal sized without goiter. Respiratory WNL. No retractions.. Cardiovascular Pedal Pulses WNL. No clubbing, cyanosis or edema. Lymphatic No adneopathy. No adenopathy. No adenopathy. Musculoskeletal Adexa without tenderness or enlargement.. Digits and nails w/o clubbing, cyanosis, infection, petechiae, ischemia, or inflammatory conditions.. Integumentary (Hair, Skin) No suspicious lesions. No crepitus or fluctuance. No peri-wound warmth or erythema. No masses.Marland Kitchen Psychiatric Judgement and insight Intact.. No evidence of depression, anxiety, or agitation.. Notes the medial wound on the right leg continues to have minimal slough which have sharply debrided with a curette. minimal bleeding controlled with pressure. The posterior lateral wound has the Apligraf and a wound check has been done today and it looks good. Electronic Signature(s) Signed: 01/09/2016 2:47:45 PM By: [name], [name] Entered By: [name] on 01/09/2016 14:47:44 Beckerman, LUCILLA MERITHEW ([id number]) -------------------------------------------------------------------------------- Physician Orders Details Patient Name: KELSEYANN, TOOT D. 01/09/2016 2:00 Date of Service: PM Medical Record [id number] Number: Patient Account Number: [id number] 08-Mar-1946 (70 y.o. Treating RN: [name] Date of Birth/Sex: Female) Other Clinician: Primary Care Physician: [facility], Delana Meyer Treating Geronimo Diliberto Referring Physician:  Glendon Axe Physician/Extender: Weeks in Treatment: 39 Verbal / Phone Orders: Yes Clinician: [name] Read Back and Verified: Yes Diagnosis Coding Wound Cleansing Wound #5 Right,Posterior Lower Leg o Cleanse wound with mild soap and water Wound #6 Right,Medial Lower Leg o Cleanse wound with mild soap and water Anesthetic Wound #5 Right,Posterior Lower Leg o Topical Lidocaine 4% cream applied to wound bed prior to debridement Wound #6 Right,Medial Lower Leg o Topical Lidocaine 4% cream applied to wound bed prior to debridement Skin Barriers/Peri-Wound Care Wound #5 Right,Posterior Lower Leg o Skin Prep Wound #6 Right,Medial Lower Leg o Skin Prep Primary Wound Dressing Wound #5 Right,Posterior Lower Leg o Dry Gauze o Boardered Foam Dressing o Drawtex o Mepitel One - steri-strips Wound #6 Right,Medial Lower Leg o Dry Gauze o Santyl Ointment o Boardered Foam Dressing Dressing Change Frequency Cude, Deanie D. ([id number]) Wound #5 Right,Posterior Lower Leg o Change dressing every week Wound #6 Right,Medial Lower Leg o Change dressing every day. Follow-up Appointments Wound #5 Right,Posterior Lower Leg o Return Appointment in 1 week. Wound #6 Right,Medial Lower Leg o Return Appointment in 1 week. Edema Control Wound #5 Right,Posterior Lower Leg o Patient to wear own compression stockings o Elevate legs to the level of the heart and pump ankles as often as possible o Other: - wear compression hose Wound #6 Right,Medial Lower Leg o Patient to wear own compression stockings   o Elevate legs to the level of the heart and pump ankles as often as possible o Other: - wear compression hose Electronic Signature(s) Signed: 01/09/2016 3:37:46 PM By: [name], [name] Signed: 01/12/2016 5:01:01 PM By: [name] Entered By: [name] on 01/09/2016 14:32:40 Hrdlicka, DAMISHA HEIDEN  ([id number]) -------------------------------------------------------------------------------- Problem List Details Patient Name: TIMIYAH, MIRCHANDANI D. 01/09/2016 2:00 Date of Service: PM Medical Record [id number] Number: Patient Account Number: [id number] 03/11/1946 (70 y.o. Treating RN: [name] Date of Birth/Sex: Female) Other Clinician: Primary Care Physician: [facility], Delana Meyer Treating [name] Referring Physician: Glendon Axe Physician/Extender: Weeks in Treatment: 39 Active Problems ICD-10 Encounter Code Description Active Date Diagnosis E11.622 Type 2 diabetes mellitus with other skin ulcer 01/31/2015 Yes E66.01 Morbid (severe) obesity due to excess calories 01/31/2015 Yes I89.0 Lymphedema, not elsewhere classified 01/31/2015 Yes L97.212 Non-pressure chronic ulcer of right calf with fat layer 11/24/2015 Yes exposed I87.311 Chronic venous hypertension (idiopathic) with ulcer of 11/24/2015 Yes right lower extremity Inactive Problems Resolved Problems ICD-10 Code Description Active Date Resolved Date L97.322 Non-pressure chronic ulcer of left ankle with fat layer 01/31/2015 01/31/2015 exposed I83.222 Varicose veins of left lower extremity with both ulcer of 03/07/2015 03/07/2015 calf and inflammation I83.223 03/07/2015 03/07/2015 Friedl, Walda D. ([id number]) 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 L03.116 Cellulitis of left lower limb 07/07/2015 07/07/2015 Electronic Signature(s) Signed: 01/09/2016 2:46:27 PM By: [name], [name] Entered By: [name] on 01/09/2016 14:46:27 Waddington, Lenola DMarland Kitchen ([id number]) -------------------------------------------------------------------------------- Progress Note Details Patient Name: [name] 01/09/2016 2:00 Date of Service: PM Medical Record [id number] Number: Patient Account Number: [id number] 04-18-1946 (70 y.o. Treating RN: [name] Date of Birth/Sex: Female) Other  Clinician: Primary Care Physician: [facility], Delana Meyer Treating [name] Referring Physician: Glendon Axe Physician/Extender: Weeks in Treatment: 1 Subjective Chief Complaint Information obtained from Patient Patient presents to the wound care center for a consult due non healing wound 70 year old patient comes with a history of having a ulcer on the left lower extremity for the past 4 weeks. she says she's had swelling of both lower extremities for about a year after she started having prednisone. 02/07/2015 -- her vascular appointments obtained were in the first and third week of June. she is able to go to [place] and we will try and get her some earlier appointments. Other than that nothing else has changed in her management. History of Present Illness (HPI) 70 year old patient who is known to have diabetes mellitus type 2, chronic renal insufficiency, coronary artery disease, hypertension, hypercholesterolemia, temporal arteritis and inflammatory arthritiss also has a history of having a hysterectomy and some orthopedic related surgeries. The ulcer on the left lower extremity started off as a blister and then. Got progressively worse. She does not have any fever or chills and has not had any recent surgical intervention for this. Her last hemoglobin A1c was 10.1 in September 2015. She has been recently put on doxycycline by her PCP. She is now also allergic to doxycycline and was this was changed over to Keflex. 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 She does see a cardiologist and also takes a diuretic. 02/07/2015 her arterial and venous duplex studies to be done have dates been given as the first and third week of June. This is at [facility]. We are going to try and get early appointments at [facility].  other than that nothing has changed in her  management. 02/14/2015 -- we have been able to get her an appointment in [facility] on May 20 which is much earlier than her previous ones at [place]. She continues with her prednisone and her sugars are in the range of 150-200. 02/21/2015 We were able to get a vascular lab workup for her today and she is going to be there at 2:00 this afternoon. the swelling of her leg has gone down significantly but she still has some tenderness over the wounds. 02/28/2015 - She has had one of two vascular workups done, and this coming Tuesday has another, at [facility]. She continues to be on steroid medications. She has significant sensitivity in her left lower extremity and has pain suggestive of neuropathic pain and I have asked her to address this with her primary care physician. 03/07/2015 -- The patient saw Dr. Lucky Cowboy for a consultation and he has had her arteries are okay but she has 2 incompetent veins on the left lower extremity and he is going to set her up for surgery. Official report is ALLYAH, TESORIERO ([id number]) awaited. Addendum: Official reports are now available and on 03/04/2015. She was seen and lower extremity venous duplex exam was done. There was reflux present within the left greater saphenous vein below the knee and also the left small saphenous vein. Arterial duplex showed normal triphasic waveforms throughout the left lower extremity without any significant stenosis. Her ABIs were noncompressible bilaterally but a waveforms were normal and a digital pressures were normal bilaterally consistent with no significant arterial insufficiency. He has recommended endovenous ablation of both the left small saphenous and the left great saphenous vein. This would still be scheduled later. 03/14/2015 -- she has heard back from the vascular office and has surgery scheduled for sometime in July. Her rheumatologist has decreased her prednisone dosage but she still on it. She  has also had cataract surgery in her right eye recently this week. 03/20/2015 - No new complaints today. Pain improved. No fever or chills. Tolerating 2 layer compression. 04/14/2015 -- she was doing very well today she went off on vacation and now her edema has increased markedly the ulceration is bigger and her diabetes is not under control. 04/21/2015 -- I spoke to her PCP Dr. Candiss Norse and discussed the management which would include being seen by a general surgeon for debridement and taking multiple punch biopsies which would help in establishing the diagnosis of this is a vasculitis. She is agreeable about this and will set her up for the procedure with Dr. Tamala Julian at [facility]. She was seen by the surgeon Dr. Jamal Collin. 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 She is scheduled to have laser ablation done next week.Pt was sent here for possible biopsy to look for vasculitis. Feel it would be better to wait after laser ablation is completed- the ulcer may heal fully and biopsy may not be necessary 04/29/2015 -- she had the venous ablation done by Dr. Lucky Cowboy last Friday and we do not have any notes yet. She is doing fine otherwise. 05/06/2015 --Review of her recent vascular intervention shows that she was seen by Dr. Lucky Cowboy on 04/29/2015. The follow-up duplex which was done showed that both the great saphenous vein and the small saphenous vein remained patent with reflux consistent with an unsuccessful ablation. He has rescheduled her for another the  endovenous ablation to be done in about 4 weeks time. 05/13/2015 -- he was seen by her surgeon Dr. Jamal Collin who asked her to continue with conservative therapy and he would speak to Dr. Lucky Cowboy about her management. Dr. Lucky Cowboy is going to schedule her surgery in the middle of August for a repeat endovenous ablation. Her pus culture from last  week has grown : [place] her noted her sensitivity report but due to her multiple allergies I had tried clindamycin and she developed a rash with this too. She has been prescribed and anti-buttocks in the ER and is has it at home and she will let is know what she is going to be taking. 05/20/2015 -- she has developed a small spot on her right lower extremity but besides that it is not a full fledged ulceration. She did not get to see Dr. Lucky Cowboy last week and hopefully she will see him in the near future. 05/27/2015 -she is still awaiting her appointment with Dr.Dew and her vascular procedure is not scheduled until August 19. She will be seeing her PCP tomorrow and I have asked her to convey our discussion so that she is aware that debridement has not been done yet. CHAREESE, VETH ([id number]) 06/03/2015 -- was seen by her rheumatologist Dr. 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 He is lowering her prednisone to 12-1/2 mg for 1 month and then 10 mg per the next month. I will again make an attempt to speak to her PCP Dr. Candiss Norse and her surgeon Dr. 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 06/17/2015 -- Dr.Dew did her vascular procedure last week and a follow-up venous ultrasound shows good resolution of the veins as per the patient's history. He is to see her back in 2 weeks. 07/07/2015. -- the patient has had a heavy growth of Proteus mirabilis and Enterococcus faecalis. These are sensitive to several drugs but the problem is she has allergies to all of these and hence I would like her to see Dr. Ola Spurr for this. She is also due to see Dr. Lucky Cowboy tomorrow and I will discussed the  management with him including debridement under anesthesia and possible biopsies. 07/14/2015 -- she has an appointment to see Dr. Ola Spurr tomorrow and did see Dr. Bunnie Domino PA who will discuss my request with him. 07/21/2015 -- saw Dr. Ola Spurr was able to do a test on her and has put her on amoxicillin. She has been tolerating that and has had no problems with allergies to this. 07/28/2015 -- Last Friday I spoke to Dr. Leotis Pain regarding her care and he said that her right leg did not need any surgery and on the left leg was doing pretty good. We did agree that if she undergoes any procedure in the future he would do a couple of punch biopsies of the wound. 08/18/2015 -- her right leg is very tender and there is significant amount of slough. The left leg is looking much better 09/02/2015 -- she is going to have a debridement and punch biopsies of her right lower extremity by Dr. Leotis Pain this coming Thursday. Also seen Dr. Ola Spurr who has continued her on ciprofloxacin. 09/09/2015 -- on 09/04/2015 Dr. Leotis Pain took her to surgery -  Irrigation and excisional debridement of skin, soft tissue, and muscle to about 40 cm2 to the right posterior calf with biopsy. Pathology results are -- DIAGNOSIS: SKIN, RIGHT LOWER EXTREMITY; BIOPSY: SKIN AND SOFT TISSUE WITH ULCERATION, SEE NOTE. - NEGATIVE FOR DYSPLASIA AND MALIGNANCY. Note: There is acute inflammation and ulceration of the epidermis. The dermis shows nonspecific inflammation, neovascularization, and hemosiderin deposition. The differential diagnosis for these findings includes stasis dermatitis, nonspecific dermatitis, and infection. Correlation with clinical findings is required. A PAS fungal stain is obtained and results will be reported in an addendum. cultures were also sent and this grew Pseudomonas aeruginosa, Escherichia coli, Proteus mirabilis, Klebsiella pneumoniae and they were all sensitive to ciprofloxacin which she is  on. 09/16/2015 -- he saw Dr. 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 He is going to call the patient in and discuss with her the management possibly with Imuran. I will continue treating her locally. 09/23/2015 -- she saw Dr. Ola Spurr today who was going to continue the antibiotics for now and stop after this course. She has an appointment to see Dr. Jefm Bryant in about a week's time. Her wound VAC has arrived but she did not bring it with her today. We will try and set her up for changes to the right leg 3 times a week. She is here for her first application of Apligraf to the left lower extremity. NAOMY, CHRONIS ([id number]) 09/30/2015 -- she has seen Dr. 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 because she is ambulatory at home health will not apply a wound VAC and she will have to come here 3 times a week on Monday Wednesday and Friday. 10/10/2015 -- she is here for a second application of Apligraf to her left lower extremity. 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 Jefm Bryant. Other than that she is doing well 10/31/2015 -- she is here for a third application of Apligraf to her left lower extremity. after reviewing her wound on the right side I noted that it is granulating extremely well and we will use the remnants of the Apligraf on the right leg. 11/14/2015 -- she was recently admitted to the hospital between January 11 and 11/10/2015 for uncontrolled hypertension, diabetes mellitus, acute kidney injury. during her admission she was supported by wound care and also by antibiotics. Around this time her immunosuppression was stopped by Dr. Nunzio Cory. She is feeling much better now. 11/21/2015 -- she has had her  fourth application of Apligraf today and it has been shared between the left and the right leg 11/24/2015 -- was here for a wound VAC change but it was noticed that she had profuse bleeding from the lobe medial part of her right lower extremity and I was called in to evaluate her. 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 She has been doing fine since then 12/19/2015 -- she has finished 5 applications of Apligraf to the left lower extremity and this is doing great. He began with pain and swelling of her right ankle a couple of days ago and it has caused her much distress. She does not have any fever or any change in her general health. 12/22/2015 --  the patient's pain and swelling of the right lower extremity has resolved over the weekend. The left lower extremity is completely healed. She has had the first application of Apligraf on the right leg today. 01/02/2016 -- She is here for the second application of Apligraf on the right leg today. Objective Constitutional Pulse regular. Respirations normal and unlabored. Afebrile. Vitals Time Taken: 2:05 PM, Height: 65 in, Weight: 248 lbs, BMI: 41.3, Temperature: 98.1 F, Pulse: 81 bpm, Respiratory Rate: 18 breaths/min, Blood Pressure: 156/67 mmHg. Eyes Nonicteric. Reactive to light. Ears, Nose, Mouth, and Throat Lips, teeth, and gums WNL.Marland Kitchen Moist mucosa without lesions. Peary, Syanna D. ([id number]) Neck supple and nontender. No palpable supraclavicular or cervical adenopathy. Normal sized without goiter. Respiratory WNL. No retractions.. Cardiovascular Pedal Pulses WNL. No clubbing, cyanosis or edema. Lymphatic No adneopathy. No adenopathy. No adenopathy. Musculoskeletal Adexa without tenderness or enlargement.. Digits and nails w/o clubbing, cyanosis, infection, petechiae, ischemia, or inflammatory conditions.Marland Kitchen Psychiatric Judgement and insight  Intact.. No evidence of depression, anxiety, or agitation.. General Notes: the medial wound on the right leg continues to have minimal slough which have sharply debrided with a curette. minimal bleeding controlled with pressure. The posterior lateral wound has the Apligraf and a wound check has been done today and it looks good. Integumentary (Hair, Skin) No suspicious lesions. No crepitus or fluctuance. No peri-wound warmth or erythema. No masses.. Wound #5 status is Open. Original cause of wound was Gradually Appeared. The wound is located on the Right,Posterior Lower Leg. The wound measures 5.2cm length x 5.8cm width x 0.1cm depth; 23.688cm^2 area and 2.369cm^3 volume. The wound is limited to skin breakdown. There is no tunneling or undermining noted. There is a large amount of serous drainage noted. The wound margin is flat and intact. There is large (67-100%) red granulation within the wound bed. There is a small (1-33%) amount of necrotic tissue within the wound bed including Adherent Slough. The periwound skin appearance exhibited: Moist. The periwound skin appearance did not exhibit: Callus, Crepitus, Excoriation, Fluctuance, Friable, Induration, Localized Edema, Rash, Scarring, Dry/Scaly, Maceration, Atrophie Blanche, Cyanosis, Ecchymosis, Hemosiderin Staining, Mottled, Pallor, Rubor, Erythema. The periwound has tenderness on palpation. Wound #6 status is Open. Original cause of wound was Gradually Appeared. The wound is located on the Right,Medial Lower Leg. The wound measures 6.2cm length x 4.1cm width x 0.2cm depth; 19.965cm^2 area and 3.993cm^3 volume. The wound is limited to skin breakdown. There is no tunneling or undermining noted. There is a medium amount of purulent drainage noted. The wound margin is distinct with the outline attached to the wound base. There is large (67-100%) red granulation within the wound bed. There is a small (1-33%) amount of necrotic tissue within the  wound bed including Eschar and Adherent Slough. The periwound skin appearance exhibited: Moist. The periwound skin appearance did not exhibit: Callus, Crepitus, Excoriation, Fluctuance, Friable, Induration, Localized Edema, Rash, Scarring, Dry/Scaly, Maceration, Atrophie Blanche, Cyanosis, Ecchymosis, Hemosiderin Staining, Mottled, Pallor, Rubor, Erythema. Periwound temperature was noted as No Abnormality. The periwound has tenderness on palpation. 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 There was  a Skin/Subcutaneous Tissue Debridement [id number]) debridement with total area of 16 sq cm performed by [name], MD. with the following instrument(s): Curette to remove Viable and Non-Viable tissue/material including Fibrin/Slough and Subcutaneous after achieving pain control using Lidocaine 4% Topical Solution. A time out was conducted prior to the start of the procedure. A Minimum amount of bleeding was controlled with Pressure. The procedure was tolerated well with a pain level of 0 throughout and a pain level of 0 following the procedure. Post Debridement Measurements: 6.2cm length x 4.1cm width x 0.2cm depth; 3.993cm^3 volume. Post procedure Diagnosis Wound #6: Same as Pre-Procedure Plan Wound Cleansing: Wound #5 Right,Posterior Lower Leg: Cleanse wound with mild soap and water Wound #6 Right,Medial Lower Leg: Cleanse wound with mild soap and water Anesthetic: Wound #5 Right,Posterior Lower Leg: Topical Lidocaine 4% cream  applied to wound bed prior to debridement Wound #6 Right,Medial Lower Leg: Topical Lidocaine 4% cream applied to wound bed prior to debridement Skin Barriers/Peri-Wound Care: ATLEIGH, MARRESE ([id number]) Wound #5 Right,Posterior Lower Leg: Skin Prep Wound #6 Right,Medial Lower Leg: Skin Prep Primary Wound Dressing: Wound #5 Right,Posterior Lower Leg: Dry Gauze Boardered Foam Dressing Drawtex Mepitel One - steri-strips Wound #6 Right,Medial Lower Leg: Dry Gauze Santyl Ointment Boardered Foam Dressing Dressing Change Frequency: Wound #5 Right,Posterior Lower Leg: Change dressing every week Wound #6 Right,Medial Lower Leg: Change dressing every day. Follow-up Appointments: Wound #5 Right,Posterior Lower Leg: Return Appointment in 1 week. Wound #6 Right,Medial Lower Leg: Return Appointment in 1 week. Edema Control: Wound #5 Right,Posterior Lower Leg: Patient to wear own compression stockings Elevate legs to the level of the heart and pump ankles as often as possible Other: - wear compression hose Wound #6 Right,Medial Lower Leg: Patient to wear own compression stockings Elevate legs to the level of the heart and pump ankles as often as possible Other: - wear compression hose Right leg medial part will be with Santyl ointment on a daily basis and the Apligraf area is to be protected well. We will also continue with compression stockings on this leg. She will be back to see me next week for her third application of Apligraf. Electronic Signature(s) Signed: 01/09/2016 2:48:53 PM By: [name], [name] Entered By: [name] on 01/09/2016 14:48:53 Duarte, CALI KIPPEN ([id number]) Hutchinson, Zayah D. ([id number]) -------------------------------------------------------------------------------- SuperBill Details Patient Name: Mangiaracina, Verlisa D. Date of Service: 01/09/2016 Medical Record Number: [id number] Patient Account Number: [id number] Date of Birth/Sex: May 19, 1946 (70 y.o.  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 Facility Procedures CPT4 Code Description: [id number] - DEB SUBQ TISSUE 20 SQ CM/< ICD-10 Description Diagnosis E11.622 Type 2 diabetes mellitus with other skin ulcer I89.0 Lymphedema, not elsewhere classified L97.212 Non-pressure chronic ulcer of right calf with fat la  I87.311 Chronic venous hypertension (idiopathic) with ulcer Modifier: yer exposed of right lowe Quantity: 1 r extremity Physician Procedures CPT4 Code Description: [id number] 11042 - WC PHYS SUBQ TISS 20 SQ CM ICD-10 Description Diagnosis E11.622 Type 2 diabetes mellitus with other skin ulcer I89.0 Lymphedema, not elsewhere classified L97.212 Non-pressure chronic ulcer of right calf with fat la  I87.311 Chronic venous hypertension (idiopathic) with ulcer Modifier: yer exposed of right lower Quantity: 1 extremity Electronic Signature(s) Signed: 01/09/2016 2:49:27 PM By: [name], [name] Entered By: [name] on 01/09/2016 14:49:27

## 2016-01-19 ENCOUNTER — Encounter: Payer: Commercial Managed Care - HMO | Admitting: Surgery

## 2016-01-19 DIAGNOSIS — E11622 Type 2 diabetes mellitus with other skin ulcer: Secondary | ICD-10-CM | POA: Diagnosis not present

## 2016-01-19 NOTE — Progress Notes (Addendum)
[name], [name] ([id number]) Visit Report for 01/19/2016 Chief Complaint Document Details Patient Name: [name], [name] 01/19/2016 3:00 Date of Service: PM Medical Record [id number] Number: Patient Account Number: [id number] [birth year] (70 y.o. Treating RN: [name] Date of Birth/Sex: Female) Other Clinician: Primary Care Physician: [facility], [name] Treating [name] Referring Physician: Glendon [name] Physician/Extender: [name]: 41 Information Obtained from: Patient Chief Complaint Patient presents to the wound care center for a consult due non healing wound 70 year old patient comes with a history of having a ulcer on the left lower extremity for the past 4 Kristin. she says she's had swelling of both lower extremities for about a year after she started having prednisone. 02/07/2015 -- her vascular appointments obtained were in the first and third week of June. she is able to go to [place] and we will try and get her some earlier appointments. Other than that nothing else has changed in her management. Electronic Signature(s) Signed: 01/19/2016 4:41:30 PM By: [name] Entered By: [name] on 01/19/2016 16:41:30 Hascall, [name] ([id number]) -------------------------------------------------------------------------------- Cellular or Tissue Based Product Details Patient Name: [name], [name] 01/19/2016 3:00 Date of Service: PM Medical Record [id number] Number: Patient Account Number: [id number] [birth year] (70 y.o. Treating RN: [name] Date of Birth/Sex: Female) Other Clinician: Primary Care Physician: [name], [name] Treating [name] Referring Physician: Glendon [name] Physician/Extender: [name]: 39 Cellular or Tissue Based Wound #5 Right,Posterior Lower Leg Product Type Applied to: Performed By: Physician Kristin Fudge, MD Cellular or Tissue Based Apligraf Product Type: Time-Out Taken: Yes Location: genitalia  / hands / feet / multiple digits Wound Size (sq cm): 21.15 Product Size (sq cm): 44 Waste Size (sq cm): 22 Waste Reason: wound size Amount of Product Applied (sq cm): 22 Lot #: [id number] Expiration Date: 01/28/2016 Fenestrated: Yes Instrument: Blade Reconstituted: Yes Solution Type: NS Solution Amount: 60ml Lot #: [id number] Solution Expiration 06/25/2017 Date: Secured: Yes Secured With: Steri-Strips Dressing Applied: Yes Primary Dressing: mepitel one Procedural Pain: 0 Post Procedural Pain: 0 Response to [name]: Procedure was tolerated well Post Procedure Diagnosis Same as Pre-procedure Electronic Signature(s) Signed: 01/19/2016 4:41:21 PM By: [name] Previous Signature: 01/19/2016 4:32:35 PM Version By: [name] D. ([id number]) Entered By: [name] on 01/19/2016 16:41:21 [name], [name] ([id number]) -------------------------------------------------------------------------------- HPI Details Patient Name: [name], [name] 01/19/2016 3:00 Date of Service: PM Medical Record [id number] Number: Patient Account Number: [id number] [birth year] (70 y.o. Treating RN: [name] Date of Birth/Sex: Female) Other Clinician: Primary Care Physician: [facility], [name] Treating [name] Referring Physician: Glendon [name] Physician/Extender: [name]: 33 History of Present Illness HPI Description: 70 year old patient who is known to have diabetes mellitus type 2, chronic renal insufficiency, coronary artery disease, hypertension, hypercholesterolemia, temporal arteritis and inflammatory arthritiss also has a history of having a hysterectomy and some orthopedic related surgeries. The ulcer on the left lower extremity started off as a blister and then. Got progressively worse. She does not have any fever or chills and has not had any recent surgical intervention for this. Her last hemoglobin A1c was 10.1 in September 2015. She has been  recently put on doxycycline by her PCP. She is now also allergic to doxycycline and was this was changed over to Keflex. 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 She does see a cardiologist and also takes a diuretic. 02/07/2015  her arterial and venous duplex studies to be done have dates been given as the first and third week of June. This is at [facility]. We are going to try and get early appointments at [facility]. other than that nothing has changed in her management. 02/14/2015 -- we have been able to get her an appointment in [facility] on May 20 which is much earlier than her previous ones at [place]. She continues with her prednisone and her sugars are in the range of 150-200. 02/21/2015 We were able to get a vascular lab workup for her today and she is going to be there at 2:00 this afternoon. the swelling of her leg has gone down significantly but she still has some tenderness over the wounds. 02/28/2015 - She has had one of two vascular workups done, and this coming Tuesday has another, at [facility]. She continues to be on steroid medications. She has significant sensitivity in her left lower extremity and has pain suggestive of neuropathic pain and I have asked her to address this with her primary care physician. 03/07/2015 -- The patient saw Dr. Lucky Cowboy for a consultation and he has had her arteries are okay but she has 2 incompetent veins on the left lower extremity and he is going to set her up for surgery. Official report is awaited. Addendum: Official reports are now available and on 03/04/2015. She was seen and lower extremity venous duplex exam was done. There was reflux present within the left greater saphenous vein below the knee and also the left small saphenous vein. Arterial duplex showed normal triphasic waveforms throughout the  left lower extremity without any significant stenosis. Her ABIs were noncompressible bilaterally but a waveforms were normal and a digital pressures were normal bilaterally consistent with no significant arterial insufficiency. He has recommended endovenous ablation of both the left small saphenous and the left great saphenous vein. This would still be scheduled later. 03/14/2015 -- she has heard back from the vascular office and has surgery scheduled for sometime in July. [name], [name] ([id number]) Her rheumatologist has decreased her prednisone dosage but she still on it. She has also had cataract surgery in her right eye recently this week. 03/20/2015 - No new complaints today. Pain improved. No fever or chills. Tolerating 2 layer compression. 04/14/2015 -- she was doing very well today she went off on vacation and now her edema has increased markedly the ulceration is bigger and her diabetes is not under control. 04/21/2015 -- I spoke to her PCP Dr. Candiss Norse and discussed the management which would include being seen by a general surgeon for debridement and taking multiple punch biopsies which would help in establishing the diagnosis of this is a vasculitis. She is agreeable about this and will set her up for the procedure with Dr. Tamala Julian at [facility]. She was seen by the surgeon Dr. Jamal Collin. 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 She is scheduled to have laser ablation done next week.Pt was sent here for possible biopsy to look for vasculitis. Feel it would be better to wait after laser ablation is completed- the ulcer may heal fully and biopsy may not be necessary 04/29/2015 -- she had the venous ablation done by Dr. Lucky Cowboy last Friday and we do not have any notes yet. She is doing fine otherwise. 05/06/2015 --Review of her recent vascular intervention shows that she was  seen by Dr.  Dew on 04/29/2015. The follow-up duplex which was done showed that both the great saphenous vein and the small saphenous vein remained patent with reflux consistent with an unsuccessful ablation. He has rescheduled her for another the endovenous ablation to be done in about 4 weekso time. 05/13/2015 -- he was seen by her surgeon Dr. Jamal Collin who asked her to continue with conservative therapy and he would speak to Dr. Lucky Cowboy about her management. Dr. Lucky Cowboy is going to schedule her surgery in the middle of August for a repeat endovenous ablation. Her pus culture from last week has grown : [place] her noted her sensitivity report but due to her multiple allergies I had tried clindamycin and she developed a rash with this too. She has been prescribed and anti-buttocks in the ER and is has it at home and she will let is know what she is going to be taking. 05/20/2015 -- she has developed a small spot on her right lower extremity but besides that it is not a full fledged ulceration. She did not get to see Dr. Lucky Cowboy last week and hopefully she will see him in the near future. 05/27/2015 -she is still awaiting her appointment with Dr.Dew and her vascular procedure is not scheduled until August 19. She will be seeing her PCP tomorrow and I have asked her to convey our discussion so that she is aware that debridement has not been done yet. 06/03/2015 -- was seen by her rheumatologist Dr. 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 He is lowering her prednisone to 12-1/2 mg for 1 month and then 10 mg per the next month. I will again make an attempt to speak to her PCP Dr. Candiss Norse and her surgeon Dr. Lucky Cowboy to see if he can organize for a debridement in the OR with multiple biopsies to establish a  diagnosis of vasculitis or pyoderma gangrenosum. 06/17/2015 -- Dr.Dew did her vascular procedure last week and a follow-up venous ultrasound shows good resolution of the veins as per the patient's history. He is to see her back in 2 Kristin. [name], [name] ([id number]) 07/07/2015. -- the patient has had a heavy growth of Proteus mirabilis and Enterococcus faecalis. These are sensitive to several drugs but the problem is she has allergies to all of these and hence I would like her to see Dr. Ola Spurr for this. She is also due to see Dr. Lucky Cowboy tomorrow and I will discussed the management with him including debridement under anesthesia and possible biopsies. 07/14/2015 -- she has an appointment to see Dr. Ola Spurr tomorrow and did see Dr. Bunnie Domino PA who will discuss my request with him. 07/21/2015 -- saw Dr. Ola Spurr was able to do a test on her and has put her on amoxicillin. She has been tolerating that and has had no problems with allergies to this. 07/28/2015 -- Last Friday I spoke to Dr. Leotis Pain regarding her care and he said that her right leg did not need any surgery and on the left leg was doing pretty good. We did agree that if she undergoes any procedure in the future he would do a couple of punch biopsies of the wound. 08/18/2015 -- her right leg is very tender and there is significant amount of slough. The left leg is looking much better 09/02/2015 -- she is going to have a debridement and  punch biopsies of her right lower extremity by Dr. Leotis Pain this coming Thursday. Also seen Dr. Ola Spurr who has continued her on ciprofloxacin. 09/09/2015 -- on 09/04/2015 Dr. Leotis Pain took her to surgery - Irrigation and excisional debridement of skin, soft tissue, and muscle to about 40 cm2 to the right posterior calf with biopsy. Pathology results are -- DIAGNOSIS: SKIN, RIGHT LOWER EXTREMITY; BIOPSY: SKIN AND SOFT TISSUE WITH ULCERATION, SEE NOTE. - NEGATIVE FOR DYSPLASIA AND  MALIGNANCY. Note: There is acute inflammation and ulceration of the epidermis. The dermis shows nonspecific inflammation, neovascularization, and hemosiderin deposition. The differential diagnosis for these findings includes stasis dermatitis, nonspecific dermatitis, and infection. Correlation with clinical findings is required. A PAS fungal stain is obtained and results will be reported in an addendum. cultures were also sent and this grew Pseudomonas aeruginosa, Escherichia coli, Proteus mirabilis, Klebsiella pneumoniae and they were all sensitive to ciprofloxacin which she is on. 09/16/2015 -- he saw Dr. 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 He is going to call the patient in and discuss with her the management possibly with Imuran. I will continue treating her locally. 09/23/2015 -- she saw Dr. Ola Spurr today who was going to continue the antibiotics for now and stop after this course. She has an appointment to see Dr. Jefm Bryant in about a week's time. Her wound VAC has arrived but she did not bring it with her today. We will try and set her up for changes to the right leg 3 times a week. She is here for her first application of Apligraf to the left lower extremity. 09/30/2015 -- she has seen Dr. 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 because she is ambulatory at home health will not apply a wound VAC and she will have to come here 3 times a week on Monday Wednesday and Friday. 10/10/2015 -- she is here for a second application of Apligraf to her left lower extremity. 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 Jefm Bryant. Other than that she is doing well 10/31/2015 -- she is here for a third application of  Apligraf to her left lower extremity. after reviewing her wound on the right side I noted that it is granulating extremely well and we will use the remnants of the Apligraf on the right leg. [name], [name] ([id number]) 11/14/2015 -- she was recently admitted to the hospital between January 11 and 11/10/2015 for uncontrolled hypertension, diabetes mellitus, acute kidney injury. during her admission she was supported by wound care and also by antibiotics. Around this time her immunosuppression was stopped by Dr. Nunzio Cory. She is feeling much better now. 11/21/2015 -- she has had her fourth application of Apligraf today and it has been shared between the left and the right leg 11/24/2015 -- was here for a wound VAC change but it was noticed that she had profuse bleeding from the lobe medial part of her right lower extremity and I was called in to evaluate her. 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 She has been doing fine since then 12/19/2015 -- she has finished 5 applications of Apligraf to the left lower extremity and this is doing great. He  began with pain and swelling of her right ankle a couple of days ago and it has caused her much distress. She does not have any fever or any change in her general health. 12/22/2015 -- the patient's pain and swelling of the right lower extremity has resolved over the weekend. The left lower extremity is completely healed. She has had the first application of Apligraf on the right leg today. 01/02/2016 -- She is here for the second application of Apligraf on the right leg today. 01/19/2016 -- she is here for the third application of Apligraf to the right posterior lateral leg. Electronic Signature(s) Signed: 01/19/2016 4:41:56 PM By: [name] Entered By: [name] on 01/19/2016 16:41:56 [name], [name]  ([id number]) -------------------------------------------------------------------------------- Physical Exam Details Patient Name: JEANINE, BRANSOM D. 01/19/2016 3:00 Date of Service: PM Medical Record [id number] Number: Patient Account Number: [id number] 04-04-1946 (70 y.o. Treating RN: [name] Date of Birth/Sex: Female) Other Clinician: Primary Care Physician: [facility], [name] Treating [name] Referring Physician: Glendon [name] Physician/Extender: [name]: 50 Constitutional . Pulse regular. Respirations normal and unlabored. Afebrile. . Eyes Nonicteric. Reactive to light. Ears, Nose, Mouth, and Throat Lips, teeth, and gums WNL.Marland Kitchen Moist mucosa without lesions. Neck supple and nontender. No palpable supraclavicular or cervical adenopathy. Normal sized without goiter. Respiratory WNL. No retractions.. Cardiovascular Pedal Pulses WNL. No clubbing, cyanosis or edema. Lymphatic No adneopathy. No adenopathy. No adenopathy. Musculoskeletal Adexa without tenderness or enlargement.. Digits and nails w/o clubbing, cyanosis, infection, petechiae, ischemia, or inflammatory conditions.. Integumentary (Hair, Skin) No suspicious lesions. No crepitus or fluctuance. No peri-wound warmth or erythema. No masses.Marland Kitchen Psychiatric Judgement and insight Intact.. No evidence of depression, anxiety, or agitation.. Notes the medial wound on the right leg has slough and no sharp debridement was required today. She will continue to use Santyl ointment on this. The wound on the right posterior lateral leg continues to have good epithelialization and we will apply the third application of Apligraf today. Electronic Signature(s) Signed: 01/19/2016 4:42:44 PM By: [name] Entered By: [name] on 01/19/2016 16:42:43 [name], [name] ([id number]) -------------------------------------------------------------------------------- Physician Orders Details Patient Name: MIKALE, SHAREEF D. 01/19/2016 3:00 Date of Service: PM Medical Record [id number] Number: Patient Account Number: [id number] 10/04/1946 (70 y.o. Treating RN: [name] Date of Birth/Sex: Female) Other Clinician: Primary Care Physician: [facility], [name] Treating Offie Pickron Referring Physician: Glendon [name] Physician/Extender: [name]: 38 Verbal / Phone Orders: Yes Clinician: Montey [name] Read Back and Verified: Yes Diagnosis Coding ICD-10 Coding Code Description E11.622 Type 2 diabetes mellitus with other skin ulcer E66.01 Morbid (severe) obesity due to excess calories I89.0 Lymphedema, not elsewhere classified L97.212 Non-pressure chronic ulcer of right calf with fat layer exposed I87.311 Chronic venous hypertension (idiopathic) with ulcer of right lower extremity Wound Cleansing Wound #5 Right,Posterior Lower Leg o Cleanse wound with mild soap and water Wound #6 Right,Medial Lower Leg o Cleanse wound with mild soap and water Anesthetic Wound #5 Right,Posterior Lower Leg o Topical Lidocaine 4% cream applied to wound bed prior to debridement Wound #6 Right,Medial Lower Leg o Topical Lidocaine 4% cream applied to wound bed prior to debridement Skin Barriers/Peri-Wound Care Wound #5 Right,Posterior Lower Leg o Skin Prep Wound #6 Right,Medial Lower Leg o Skin Prep Primary Wound Dressing Wound #5 Right,Posterior Lower Leg o Dry Gauze o Boardered Foam Dressing Vankleeck, Avereigh D. ([id number]) o Drawtex o Mepitel One - steri-strips o Other: - Apligraph Wound #6 Right,Medial Lower Leg o Dry Gauze o Santyl  Ointment o Boardered Foam Dressing Dressing Change Frequency Wound #5 Right,Posterior Lower Leg o Change dressing every week Wound #6 Right,Medial Lower Leg o Change dressing every other day. Follow-up Appointments Wound #5 Right,Posterior Lower Leg o Return Appointment in 1 week. Wound #6 Right,Medial Lower Leg o Return  Appointment in 1 week. Edema Control Wound #5 Right,Posterior Lower Leg o Elevate legs to the level of the heart and pump ankles as often as possible o Other: - wear compression hose Wound #6 Right,Medial Lower Leg o Elevate legs to the level of the heart and pump ankles as often as possible o Other: - wear compression hose Advanced Therapies Wound #5 Right,Posterior Lower Leg o Apligraf application in clinic; including contact layer, fixation with steri strips, dry gauze and cover dressing. Electronic Signature(s) Signed: 01/19/2016 4:41:28 PM By: [name] Signed: 01/19/2016 4:52:55 PM By: [name] Previous Signature: 01/19/2016 4:40:46 PM Version By: [name] Entered By: [name] on 01/19/2016 16:41:28 Hitt, RASHON MAGYAR ([id number]) -------------------------------------------------------------------------------- Problem List Details Patient Name: [name], [name] 01/19/2016 3:00 Date of Service: PM Medical Record [id number] Number: Patient Account Number: [id number] 01-23-1946 (70 y.o. Treating RN: [name] Date of Birth/Sex: Female) Other Clinician: Primary Care Physician: [facility], [name] Treating [name] Referring Physician: Glendon [name] Physician/Extender: [name]: 43 Active Problems ICD-10 Encounter Code Description Active Date Diagnosis E11.622 Type 2 diabetes mellitus with other skin ulcer 01/31/2015 Yes E66.01 Morbid (severe) obesity due to excess calories 01/31/2015 Yes I89.0 Lymphedema, not elsewhere classified 01/31/2015 Yes L97.212 Non-pressure chronic ulcer of right calf with fat layer 11/24/2015 Yes exposed I87.311 Chronic venous hypertension (idiopathic) with ulcer of 11/24/2015 Yes right lower extremity Inactive Problems Resolved Problems ICD-10 Code Description Active Date Resolved Date L97.322 Non-pressure chronic ulcer of left ankle with fat layer 01/31/2015 01/31/2015 exposed I83.222 Varicose veins  of left lower extremity with both ulcer of 03/07/2015 03/07/2015 calf and inflammation I83.223 03/07/2015 03/07/2015 [name], [name] D. 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 ([id number]) -------------------------------------------------------------------------------- Progress Note Details Patient Name: [name] 01/19/2016 3:00 Date of Service: PM Medical Record [id number] Number: Patient Account Number: [id number] 23-Jul-1946 (70 y.o. Treating RN: [name] Date of Birth/Sex: Female) Other Clinician: Primary Care Physician: [facility], [name] Treating [name] Referring Physician: Glendon [name] Physician/Extender: [name]: 50 Subjective Chief Complaint Information obtained from Patient Patient presents to the wound care center for a consult due non healing wound 70 year old patient comes with a history of having a ulcer on the left lower extremity for the past 4 Kristin. she says she's had swelling of both lower extremities for about a year after she started having prednisone. 02/07/2015 -- her vascular appointments obtained were in the first and third week of June. she is able to go to [place] and we will try and get her some earlier appointments. Other than that nothing else has changed in her management. History of Present Illness (HPI) 70 year old patient who is known to have diabetes mellitus type 2, chronic renal insufficiency, coronary artery disease, hypertension, hypercholesterolemia, temporal arteritis and inflammatory arthritiss also has a history of having a hysterectomy and some orthopedic related surgeries. The ulcer on the left lower extremity started off as a blister and then. Got progressively  worse. She does not have any fever or  chills and has not had any recent surgical intervention for this. Her last hemoglobin A1c was 10.1 in September 2015. She has been recently put on doxycycline by her PCP. She is now also allergic to doxycycline and was this was changed over to Keflex. 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 She does see a cardiologist and also takes a diuretic. 02/07/2015 her arterial and venous duplex studies to be done have dates been given as the first and third week of June. This is at [facility]. We are going to try and get early appointments at [facility]. other than that nothing has changed in her management. 02/14/2015 -- we have been able to get her an appointment in [facility] on May 20 which is much earlier than her previous ones at [place]. She continues with her prednisone and her sugars are in the range of 150-200. 02/21/2015 We were able to get a vascular lab workup for her today and she is going to be there at 2:00 this afternoon. the swelling of her leg has gone down significantly but she still has some tenderness over the wounds. 02/28/2015 - She has had one of two vascular workups done, and this coming Tuesday has another, at [place] [facility]. She continues to be on steroid medications. She has significant sensitivity in her left lower extremity and has pain suggestive of neuropathic pain and I have asked her to address this with her primary care physician. 03/07/2015 -- The patient saw Dr. Lucky Cowboy for a consultation and he has had her arteries are okay but she has 2 incompetent veins on the left lower extremity and he is going to set her up for surgery. Official report is CHARESE, WERGIN ([id number]) awaited. Addendum: Official reports are now available and on 03/04/2015. She was seen and lower extremity venous duplex exam  was done. There was reflux present within the left greater saphenous vein below the knee and also the left small saphenous vein. Arterial duplex showed normal triphasic waveforms throughout the left lower extremity without any significant stenosis. Her ABIs were noncompressible bilaterally but a waveforms were normal and a digital pressures were normal bilaterally consistent with no significant arterial insufficiency. He has recommended endovenous ablation of both the left small saphenous and the left great saphenous vein. This would still be scheduled later. 03/14/2015 -- she has heard back from the vascular office and has surgery scheduled for sometime in July. Her rheumatologist has decreased her prednisone dosage but she still on it. She has also had cataract surgery in her right eye recently this week. 03/20/2015 - No new complaints today. Pain improved. No fever or chills. Tolerating 2 layer compression. 04/14/2015 -- she was doing very well today she went off on vacation and now her edema has increased markedly the ulceration is bigger and her diabetes is not under control. 04/21/2015 -- I spoke to her PCP Dr. Candiss Norse and discussed the management which would include being seen by a general surgeon for debridement and taking multiple punch biopsies which would help in establishing the diagnosis of this is a vasculitis. She is agreeable about this and will set her up for the procedure with Dr. Tamala Julian at [facility]. She was seen by the surgeon Dr. Jamal Collin. 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  She is scheduled to have laser ablation done next week.Pt was sent here for possible biopsy to look for vasculitis. Feel it would be better to wait after laser ablation is completed- the ulcer may heal fully and biopsy may not be necessary 04/29/2015 -- she had the venous ablation  done by Dr. Lucky Cowboy last Friday and we do not have any notes yet. She is doing fine otherwise. 05/06/2015 --Review of her recent vascular intervention shows that she was seen by Dr. Lucky Cowboy on 04/29/2015. The follow-up duplex which was done showed that both the great saphenous vein and the small saphenous vein remained patent with reflux consistent with an unsuccessful ablation. He has rescheduled her for another the endovenous ablation to be done in about 4 Kristin time. 05/13/2015 -- he was seen by her surgeon Dr. Jamal Collin who asked her to continue with conservative therapy and he would speak to Dr. Lucky Cowboy about her management. Dr. Lucky Cowboy is going to schedule her surgery in the middle of August for a repeat endovenous ablation. Her pus culture from last week has grown : [place] her noted her sensitivity report but due to her multiple allergies I had tried clindamycin and she developed a rash with this too. She has been prescribed and anti-buttocks in the ER and is has it at home and she will let is know what she is going to be taking. 05/20/2015 -- she has developed a small spot on her right lower extremity but besides that it is not a full fledged ulceration. She did not get to see Dr. Lucky Cowboy last week and hopefully she will see him in the near future. 05/27/2015 -she is still awaiting her appointment with Dr.Dew and her vascular procedure is not scheduled until August 19. She will be seeing her PCP tomorrow and I have asked her to convey our discussion so that she is aware that debridement has not been done yet. TODD, GAMBEL ([id number]) 06/03/2015 -- was seen by her rheumatologist Dr. 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 He is lowering her prednisone to 12-1/2 mg for 1 month and then 10 mg  per the next month. I will again make an attempt to speak to her PCP Dr. Candiss Norse and her surgeon Dr. 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 06/17/2015 -- Dr.Dew did her vascular procedure last week and a follow-up venous ultrasound shows good resolution of the veins as per the patient's history. He is to see her back in 2 Kristin. 07/07/2015. -- the patient has had a heavy growth of Proteus mirabilis and Enterococcus faecalis. These are sensitive to several drugs but the problem is she has allergies to all of these and hence I would like her to see Dr. Ola Spurr for this. She is also due to see Dr. Lucky Cowboy tomorrow and I will discussed the management with him including debridement under anesthesia and possible biopsies. 07/14/2015 -- she has an appointment to see Dr. Ola Spurr tomorrow and did see Dr. Bunnie Domino PA who will discuss my request with him. 07/21/2015 -- saw Dr. Ola Spurr was able to do a test on her and has put her on amoxicillin. She has been tolerating that and has had no problems with allergies to this. 07/28/2015 -- Last Friday I  spoke to Dr. Leotis Pain regarding her care and he said that her right leg did not need any surgery and on the left leg was doing pretty good. We did agree that if she undergoes any procedure in the future he would do a couple of punch biopsies of the wound. 08/18/2015 -- her right leg is very tender and there is significant amount of slough. The left leg is looking much better 09/02/2015 -- she is going to have a debridement and punch biopsies of her right lower extremity by Dr. Leotis Pain this coming Thursday. Also seen Dr. Ola Spurr who has continued her on ciprofloxacin. 09/09/2015 -- on 09/04/2015 Dr. Leotis Pain took her to surgery - Irrigation and excisional debridement of skin, soft tissue, and muscle to about 40 cm2 to the right posterior calf with  biopsy. Pathology results are -- DIAGNOSIS: SKIN, RIGHT LOWER EXTREMITY; BIOPSY: SKIN AND SOFT TISSUE WITH ULCERATION, SEE NOTE. - NEGATIVE FOR DYSPLASIA AND MALIGNANCY. Note: There is acute inflammation and ulceration of the epidermis. The dermis shows nonspecific inflammation, neovascularization, and hemosiderin deposition. The differential diagnosis for these findings includes stasis dermatitis, nonspecific dermatitis, and infection. Correlation with clinical findings is required. A PAS fungal stain is obtained and results will be reported in an addendum. cultures were also sent and this grew Pseudomonas aeruginosa, Escherichia coli, Proteus mirabilis, Klebsiella pneumoniae and they were all sensitive to ciprofloxacin which she is on. 09/16/2015 -- he saw Dr. 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 He is going to call the patient in and discuss with her the management possibly with Imuran. I will continue treating her locally. 09/23/2015 -- she saw Dr. Ola Spurr today who was going to continue the antibiotics for now and stop after this course. She has an appointment to see Dr. Jefm Bryant in about a week's time. Her wound VAC has arrived but she did not bring it with her today. We will try and set her up for changes to the right leg 3 times a week. She is here for her first application of Apligraf to the left lower extremity. CERRIA, RONCHETTI ([id number]) 09/30/2015 -- she has seen Dr. 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 because she is ambulatory at home health will not apply a wound VAC and she will have to come here 3 times a week on Monday Wednesday and Friday. 10/10/2015 -- she is here for a second application of Apligraf to her left lower extremity. 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 Jefm Bryant. Other than that she is doing well 10/31/2015 -- she is here for a third application of Apligraf to her left lower extremity. after reviewing her wound on the right side I noted that it is granulating extremely well and we will use the remnants of the Apligraf on the right leg. 11/14/2015 -- she was recently admitted to the hospital between January 11 and 11/10/2015 for uncontrolled hypertension, diabetes mellitus, acute kidney injury. during her admission she was supported by wound care and also by antibiotics. Around this time her immunosuppression was stopped by Dr. Nunzio Cory. She is feeling much better now. 11/21/2015 -- she has had her fourth application of Apligraf today and it has been shared between the left and the right leg 11/24/2015 --  was here for a wound VAC change but it was noticed that she had profuse bleeding from the lobe medial part of her right lower extremity and I was called in to evaluate her. 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 She has been doing fine since then 12/19/2015 -- she has finished 5 applications of Apligraf to the left lower extremity and this is doing great. He began with pain and swelling of her right ankle a couple of days ago and it has caused her much distress. She does not have any fever or any change in her general health. 12/22/2015 -- the patient's pain and swelling of the right lower extremity has resolved over the weekend. The left lower extremity is completely healed. She has had the first application of Apligraf on the right leg today. 01/02/2016 -- She is here for the second application of Apligraf on the right leg today. 01/19/2016 -- she is here for the third application of Apligraf to the right posterior lateral leg. Objective Constitutional Pulse  regular. Respirations normal and unlabored. Afebrile. Vitals Time Taken: 3:16 PM, Height: 65 in, Weight: 248 lbs, BMI: 41.3, Temperature: 98.4 F, Pulse: 82 bpm, Respiratory Rate: 18 breaths/min, Blood Pressure: 157/66 mmHg. Eyes Nonicteric. Reactive to light. Ears, Nose, Mouth, and Throat Gervase, Zyann D. ([id number]) Lips, teeth, and gums WNL.Marland Kitchen Moist mucosa without lesions. Neck supple and nontender. No palpable supraclavicular or cervical adenopathy. Normal sized without goiter. Respiratory WNL. No retractions.. Cardiovascular Pedal Pulses WNL. No clubbing, cyanosis or edema. Lymphatic No adneopathy. No adenopathy. No adenopathy. Musculoskeletal Adexa without tenderness or enlargement.. Digits and nails w/o clubbing, cyanosis, infection, petechiae, ischemia, or inflammatory conditions.Marland Kitchen Psychiatric Judgement and insight Intact.. No evidence of depression, anxiety, or agitation.. General Notes: the medial wound on the right leg has slough and no sharp debridement was required today. She will continue to use Santyl ointment on this. The wound on the right posterior lateral leg continues to have good epithelialization and we will apply the third application of Apligraf today. Integumentary (Hair, Skin) No suspicious lesions. No crepitus or fluctuance. No peri-wound warmth or erythema. No masses.. Wound #5 status is Open. Original cause of wound was Gradually Appeared. The wound is located on the Right,Posterior Lower Leg. The wound measures 4.5cm length x 4.7cm width x 0.1cm depth; 16.611cm^2 area and 1.661cm^3 volume. The wound is limited to skin breakdown. There is no tunneling or undermining noted. There is a large amount of serous drainage noted. The wound margin is flat and intact. There is large (67-100%) red granulation within the wound bed. There is a small (1-33%) amount of necrotic tissue within the wound bed including Adherent Slough. The periwound skin appearance exhibited:  Moist. The periwound skin appearance did not exhibit: Callus, Crepitus, Excoriation, Fluctuance, Friable, Induration, Localized Edema, Rash, Scarring, Dry/Scaly, Maceration, Atrophie Blanche, Cyanosis, Ecchymosis, Hemosiderin Staining, Mottled, Pallor, Rubor, Erythema. The periwound has tenderness on palpation. Wound #6 status is Open. Original cause of wound was Gradually Appeared. The wound is located on the Right,Medial Lower Leg. The wound measures 6.1cm length x 4cm width x 0.2cm depth; 19.164cm^2 area and 3.833cm^3 volume. The wound is limited to skin breakdown. There is no tunneling or undermining noted. There is a medium amount of purulent drainage noted. The wound margin is distinct with the outline attached to the wound base. There is large (67-100%) red granulation within the wound bed. There is a small (  1-33%) amount of necrotic tissue within the wound bed including Eschar and Adherent Slough. The periwound skin appearance exhibited: Moist. The periwound skin appearance did not exhibit: Callus, Crepitus, Excoriation, Fluctuance, Friable, Induration, Localized Edema, Rash, Scarring, Dry/Scaly, Maceration, Atrophie Blanche, Cyanosis, Ecchymosis, Hemosiderin Staining, Mottled, Pallor, Rubor, Erythema. Periwound temperature was noted as No Abnormality. The periwound has tenderness on palpation. 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 A skin graft procedure using a bioengineered skin substitute/cellular or tissue based product was performed by Kristin Fudge, MD. Apligraf was applied and secured with Steri-Strips. 22 sq cm of product was utilized and 22 sq cm was wasted due to wound size. Post Application, mepitel one was applied. A Time Out was conducted prior to the start of the procedure. The procedure was tolerated well with a pain level of 0 throughout and a pain level of 0 following the procedure. Post procedure Diagnosis Wound #5: Same as Pre-Procedure . Plan Wound Cleansing: Wound #5 Right,Posterior Lower Leg: Cleanse wound with mild soap and water Wound #6 Right,Medial Lower Leg: Cleanse wound with mild soap and water Anesthetic: Wound #5 Right,Posterior Lower Leg: Topical Lidocaine 4% cream applied to wound bed prior to debridement Wound #6 Right,Medial Lower Leg: Topical Lidocaine 4% cream applied to wound bed prior to debridement Skin Barriers/Peri-Wound Care: Wound #5 Right,Posterior Lower Leg: Skin Prep Mcneill, Irena D. ([id number]) Wound #6 Right,Medial Lower Leg: Skin Prep Primary Wound Dressing: Wound #5 Right,Posterior Lower Leg: Dry Gauze Boardered Foam Dressing Drawtex Mepitel One - steri-strips Other: - Apligraph Wound #6 Right,Medial Lower Leg: Dry Gauze Santyl Ointment Boardered Foam Dressing Dressing Change Frequency: Wound #5 Right,Posterior Lower Leg: Change dressing every week Wound #6 Right,Medial Lower Leg: Change dressing every other day. Follow-up Appointments: Wound #5 Right,Posterior Lower Leg: Return Appointment in 1 week. Wound #6 Right,Medial Lower Leg: Return Appointment in 1 week. Edema Control: Wound #5 Right,Posterior Lower Leg: Elevate legs to the level of the heart and pump ankles as often as possible Other: - wear compression hose Wound #6 Right,Medial Lower Leg: Elevate legs to the level of the heart and pump ankles as often as possible Other: - wear compression hose Advanced Therapies: Wound #5 Right,Posterior Lower Leg: Apligraf application in clinic; including  contact layer, fixation with steri strips, dry gauze and cover dressing. her right medial lower extremity will continue to have Santyl ointment applied and this wound to be dressed separately. She has had the third application of Apligraf applied to the right posterior lateral leg. She'll continue to use her compression stockings. we will see her back for a wound check next week. Electronic Signature(s) Signed: 01/19/2016 4:44:35 PM By: [name] Entered By: [name] on 01/19/2016 16:44:35 Prowse, Valeree D. ([id number]) -------------------------------------------------------------------------------- SuperBill Details Patient Name: JADORA, CARRAHER D. Date of Service: 01/19/2016 Medical Record Number: [id number] Patient Account Number: [id number] Date of Birth/Sex: 11/01/1945 (70 y.o. Female) Treating RN: [name] Primary Care Physician: [name] Other Clinician: Referring Physician: Glendon [name] Treating Physician/Extender: Frann Rider in [name]: 49 Diagnosis Coding ICD-10 Codes Code Description E11.622 Type 2 diabetes mellitus with other skin ulcer  E66.01 Morbid (severe) obesity due to excess calories I89.0 Lymphedema, not elsewhere classified L97.212 Non-pressure chronic ulcer of right calf with fat layer exposed I87.311 Chronic venous hypertension (idiopathic) with ulcer of right lower extremity Facility Procedures CPT4 Code Description: [id number] (Facility Use Only) Apligraf 1 SQ CM Modifier: Quantity: 29 CPT4 Code Description: [id number] - SKIN SUB GRAFT FACE/NK/HF/G ICD-10 Description Diagnosis E11.622 Type 2 diabetes mellitus with other skin ulcer I89.0 Lymphedema, not elsewhere classified I87.311 Chronic venous hypertension (idiopathic) with ulcer  L97.212 Non-pressure chronic ulcer of right calf with fat la Modifier: of right lowe yer exposed Quantity: 1 r extremity Physician Procedures CPT4 Code Description: [id number] - WC PHYS  SKIN SUB GRAFT FACE/NK/HF/G ICD-10 Description Diagnosis E11.622 Type 2 diabetes mellitus with other skin ulcer I89.0 Lymphedema, not elsewhere classified I87.311 Chronic venous hypertension (idiopathic)  with ulcer of L97.212 Non-pressure chronic ulcer of right calf with fat laye Modifier: right lower r exposed Quantity: 1 extremity Electronic Signature(s) Signed: 01/19/2016 4:44:50 PM By: [name] Entered By: [name] on 01/19/2016 16:44:49 Vore, Julie-Anne D. ([id number])

## 2016-01-20 NOTE — Progress Notes (Signed)
MARVINA, YRIGOYEN ([id number]) Visit Report for 01/19/2016 Arrival Information Details Patient Name: [name], [name] Date of Service: 01/19/2016 3:00 PM Medical Record Number: [id number] Patient Account Number: [id number] Date of Birth/Sex: September 03, 1946 (70 y.o. Female) Treating RN: [name] Primary Care Physician: Glendon Axe Other Clinician: Referring Physician: Glendon Axe Treating Physician/Extender: Frann Rider in Treatment: 55 Visit Information History Since Last Visit Added or deleted any medications: No Patient Arrived: Cane Any new allergies or adverse reactions: No Arrival Time: 15:14 Had a fall or experienced change in No Accompanied By: self activities of daily living that may affect Transfer Assistance: None risk of falls: Patient Identification Verified: Yes Signs or symptoms of abuse/neglect since last No Secondary Verification Process Yes visito Completed: Hospitalized since last visit: No Patient Requires Transmission- No Pain Present Now: No Based Precautions: Patient Has Alerts: Yes Patient Alerts: Patient on Blood Thinner Electronic Signature(s) Signed: 01/19/2016 5:44:14 PM By: [name] Entered By: [name] on 01/19/2016 15:15:09 Shook, Kurstin D. ([id number]) -------------------------------------------------------------------------------- Encounter Discharge Information Details Patient Name: [name], [name] Date of Service: 01/19/2016 3:00 PM Medical Record Number: [id number] Patient Account Number: [id number] Date of Birth/Sex: 08-Jun-1946 (70 y.o. Female) Treating RN: [name] Primary Care Physician: Glendon Axe Other Clinician: Referring Physician: Glendon Axe Treating Physician/Extender: Frann Rider in Treatment: 34 Encounter Discharge Information Items Discharge Pain Level: 0 Discharge Condition: Stable Ambulatory Status: Cane Discharge Destination: Home Transportation: Private Auto Accompanied  By: self Schedule Follow-up Appointment: Yes Medication Reconciliation completed No and provided to Patient/Care Lotus Gover: Provided on Clinical Summary of Care: 01/19/2016 Form Type Recipient Paper Patient LM Electronic Signature(s) Signed: 01/19/2016 4:42:59 PM By: [name] Previous Signature: 01/19/2016 3:59:55 PM Version By: Ruthine Dose Entered By: [name] on 01/19/2016 16:42:59 Rookstool, Tae D. ([id number]) -------------------------------------------------------------------------------- Lower Extremity Assessment Details Patient Name: [name], [name] Date of Service: 01/19/2016 3:00 PM Medical Record Number: [id number] Patient Account Number: [id number] Date of Birth/Sex: Nov 11, 1945 (70 y.o. Female) Treating RN: [name] Primary Care Physician: Glendon Axe Other Clinician: Referring Physician: Glendon Axe Treating Physician/Extender: Frann Rider in Treatment: 50 Edema Assessment Assessed: [Left: No] [Right: No] Edema: [Left: Ye] [Right: s] Vascular Assessment Pulses: Posterior Tibial Dorsalis Pedis Palpable: [Right:Yes] Extremity colors, hair growth, and conditions: Extremity Color: [Right:Hyperpigmented] Hair Growth on Extremity: [Right:No] Temperature of Extremity: [Right:Warm] Capillary Refill: [Right:< 3 seconds] Electronic Signature(s) Signed: 01/19/2016 5:44:14 PM By: [name] Entered By: [name] on 01/19/2016 15:32:33 Charlesworth, Charmayne D. ([id number]) -------------------------------------------------------------------------------- Multi Wound Chart Details Patient Name: Kristin Stare D. Date of Service: 01/19/2016 3:00 PM Medical Record Number: [id number] Patient Account Number: [id number] Date of Birth/Sex: [birth year] (70 y.o. Female) Treating RN: [name] Primary Care Physician: Glendon Axe Other Clinician: Referring Physician: Glendon Axe Treating Physician/Extender: Frann Rider in Treatment:  50 Vital Signs Height(in): 65 Pulse(bpm): 82 Weight(lbs): 248 Blood Pressure 157/66 (mmHg): Body Mass Index(BMI): 41 Temperature(F): 98.4 Respiratory Rate 18 (breaths/min): Photos: [5:No Photos] [6:No Photos] [N/A:N/A] Wound Location: [5:Right Lower Leg - Posterior] [6:Right Lower Leg - Medial N/A] Wounding Event: [5:Gradually Appeared] [6:Gradually Appeared] [N/A:N/A] Primary Etiology: [5:Venous Leg Ulcer] [6:Diabetic Wound/Ulcer of the Lower Extremity] [N/A:N/A] Comorbid History: [5:Cataracts, Asthma, Coronary Artery Disease, Coronary Artery Disease, Hypertension, Type II Diabetes, Osteoarthritis, Diabetes, Osteoarthritis, Neuropathy] [6:Cataracts, Asthma, Hypertension, Type II Neuropathy] [N/A:N/A] Date Acquired: [5:04/29/2015] [6:10/10/2015] [N/A:N/A] Weeks of Treatment: [5:34] [6:14] [N/A:N/A] Wound Status: [5:Open] [6:Open] [N/A:N/A] Measurements L x W x D 4.5x4.7x0.1 [6:6.1x4x0.2] [N/A:N/A] (cm) Area (cm) : [5:16.611] [6:19.164] [N/A:N/A] Volume (cm) : [5:1.661] [6:3.833] [  N/A:N/A] % Reduction in Area: [5:-8735.60%] [6:-454.50%] [N/A:N/A] % Reduction in Volume: -8642.10% [6:-1007.80%] [N/A:N/A] Classification: [5:Full Thickness Without Exposed Support Structures] [6:Grade 1] [N/A:N/A] HBO Classification: [5:Grade 1] [6:N/A] [N/A:N/A] Exudate Amount: [5:Large] [6:Medium] [N/A:N/A] Exudate Type: [5:Serous] [6:Purulent] [N/A:N/A] Exudate Color: [5:amber] [6:yellow, brown, green] [N/A:N/A] Wound Margin: [5:Flat and Intact] [6:Distinct, outline attached] [N/A:N/A] Granulation Amount: [5:Large (67-100%)] [6:Large (67-100%)] [N/A:N/A] Granulation Quality: [5:Red, Hyper-granulation] [6:Red] [N/A:N/A] Necrotic Amount: Small (1-33%) Small (1-33%) N/A Necrotic Tissue: Adherent Slough Eschar, Adherent Slough N/A Exposed Structures: Fascia: No Fascia: No N/A Fat: No Fat: No Tendon: No Tendon: No Muscle: No Muscle: No Joint: No Joint: No Bone: No Bone: No Limited to Skin  Limited to Skin Breakdown Breakdown Epithelialization: None None N/A Periwound Skin Texture: Edema: No Edema: No N/A Excoriation: No Excoriation: No Induration: No Induration: No Callus: No Callus: No Crepitus: No Crepitus: No Fluctuance: No Fluctuance: No Friable: No Friable: No Rash: No Rash: No Scarring: No Scarring: No Periwound Skin Moist: Yes Moist: Yes N/A Moisture: Maceration: No Maceration: No Dry/Scaly: No Dry/Scaly: No Periwound Skin Color: Atrophie Blanche: No Atrophie Blanche: No N/A Cyanosis: No Cyanosis: No Ecchymosis: No Ecchymosis: No Erythema: No Erythema: No Hemosiderin Staining: No Hemosiderin Staining: No Mottled: No Mottled: No Pallor: No Pallor: No Rubor: No Rubor: No Temperature: N/A No Abnormality N/A Tenderness on Yes Yes N/A Palpation: Wound Preparation: Ulcer Cleansing: Ulcer Cleansing: N/A Rinsed/Irrigated with Rinsed/Irrigated with Saline Saline Topical Anesthetic Topical Anesthetic Applied: Other: lidocaine Applied: Other: lidocaine 4% 4% Treatment Notes Electronic Signature(s) Signed: 01/19/2016 5:44:14 PM By: [name] Entered By: [name] on 01/19/2016 15:32:09 [name], [name] ([id number]) -------------------------------------------------------------------------------- Multi-Disciplinary Care Plan Details Patient Name: [name], [name] Date of Service: 01/19/2016 3:00 PM Medical Record Number: [id number] Patient Account Number: [id number] Date of Birth/Sex: [birth year] (70 y.o. Female) Treating RN: [name] Primary Care Physician: Glendon Axe Other Clinician: Referring Physician: Glendon Axe Treating Physician/Extender: Frann Rider in Treatment: 57 Active Inactive Orientation to the Wound Care Program Nursing Diagnoses: Knowledge deficit related to the wound healing center program Goals: Patient/caregiver will verbalize understanding of the [place] [facility] Program Date  Initiated: 01/31/2015 Goal Status: Active Interventions: Provide education on orientation to the wound center Notes: Venous Leg Ulcer Nursing Diagnoses: Potential for venous Insuffiency (use before diagnosis confirmed) Goals: Non-invasive venous studies are completed as ordered Date Initiated: 01/31/2015 Goal Status: Active Patient/caregiver will verbalize understanding of disease process and disease management Date Initiated: 01/31/2015 Goal Status: Active Interventions: Assess peripheral edema status every visit. Notes: Wound/Skin Impairment Nursing Diagnoses: Impaired tissue integrity Knowledge deficit related to smoking impact on wound healing [name], [name] ([id number]) Goals: Patient/caregiver will verbalize understanding of skin care regimen Date Initiated: 01/31/2015 Goal Status: Active Ulcer/skin breakdown will heal within 14 weeks Date Initiated: 01/31/2015 Goal Status: Active Interventions: Assess ulceration(s) every visit Notes: Electronic Signature(s) Signed: 01/19/2016 5:44:14 PM By: [name] Entered By: [name] on 01/19/2016 15:31:57 [name], [name] ([id number]) -------------------------------------------------------------------------------- Patient/Caregiver Education Details Patient Name: Kristin Stare D. Date of Service: 01/19/2016 3:00 PM Medical Record Number: [id number] Patient Account Number: [id number] Date of Birth/Gender: 03-01-1946 (70 y.o. Female) Treating RN: [name] Primary Care Physician: Glendon Axe Other Clinician: Referring Physician: Glendon Axe Treating Physician/Extender: Frann Rider in Treatment: 14 Education Assessment Education Provided To: Patient Education Topics Provided Venous: Handouts: Other: continue leg elevation Methods: Demonstration, Explain/Verbal Responses: State content correctly Electronic Signature(s) Signed: 01/19/2016 4:43:19 PM By: [name] Entered By: [name] on  01/19/2016 16:43:19 [name], [name] (  [id number]) -------------------------------------------------------------------------------- Wound Assessment Details Patient Name: [name], BIRCHETT. Date of Service: 01/19/2016 3:00 PM Medical Record Number: [id number] Patient Account Number: [id number] Date of Birth/Sex: Mar 13, 1946 (70 y.o. Female) Treating RN: [name] Primary Care Physician: Glendon Axe Other Clinician: Referring Physician: Glendon Axe Treating Physician/Extender: Frann Rider in Treatment: 50 Wound Status Wound Number: 5 Primary Venous Leg Ulcer Etiology: Wound Location: Right Lower Leg - Posterior Wound Open Wounding Event: Gradually Appeared Status: Date Acquired: 04/29/2015 Comorbid Cataracts, Asthma, Coronary Artery Weeks Of Treatment: 34 History: Disease, Hypertension, Type II Clustered Wound: No Diabetes, Osteoarthritis, Neuropathy Photos Photo Uploaded By: [name] on 01/19/2016 17:10:59 Wound Measurements Length: (cm) 4.5 Width: (cm) 4.7 Depth: (cm) 0.1 Area: (cm) 16.611 Volume: (cm) 1.661 % Reduction in Area: -8735.6% % Reduction in Volume: -8642.1% Epithelialization: None Tunneling: No Undermining: No Wound Description Full Thickness Without Classification: Exposed Support Structures Diabetic Severity Grade 1 (Wagner): Wound Margin: Flat and Intact Exudate Amount: Large Exudate Type: Serous Exudate Color: amber Foul Odor After Cleansing: No Wound Bed Granulation Amount: Large (67-100%) Exposed Structure [name], [name] ([id number]) Granulation Quality: Red, Hyper-granulation Fascia Exposed: No Necrotic Amount: Small (1-33%) Fat Layer Exposed: No Necrotic Quality: Adherent Slough Tendon Exposed: No Muscle Exposed: No Joint Exposed: No Bone Exposed: No Limited to Skin Breakdown Periwound Skin Texture Texture Color No Abnormalities Noted: No No Abnormalities Noted: No Callus: No Atrophie Blanche: No Crepitus:  No Cyanosis: No Excoriation: No Ecchymosis: No Fluctuance: No Erythema: No Friable: No Hemosiderin Staining: No Induration: No Mottled: No Localized Edema: No Pallor: No Rash: No Rubor: No Scarring: No Temperature / Pain Moisture Tenderness on Palpation: Yes No Abnormalities Noted: No Dry / Scaly: No Maceration: No Moist: Yes Wound Preparation Ulcer Cleansing: Rinsed/Irrigated with Saline Topical Anesthetic Applied: Other: lidocaine 4%, Treatment Notes Wound #5 (Right, Posterior Lower Leg) 1. Cleansed with: Clean wound with Normal Saline 2. Anesthetic Topical Lidocaine 4% cream to wound bed prior to debridement 3. Peri-wound Care: Skin Prep 4. Dressing Applied: Mepitel Other dressing (specify in notes) 5. Secondary Dressing Applied Bordered Foam Dressing Notes Apligraf applied by Dr Con Memos today in clinic, drawtex [name], [name] ([id number]) Electronic Signature(s) Signed: 01/19/2016 5:44:14 PM By: [name] Entered By: [name] on 01/19/2016 15:31:05 Mcvey, Davon D. ([id number]) -------------------------------------------------------------------------------- Wound Assessment Details Patient Name: CYNTHIE, RAMSELL D. Date of Service: 01/19/2016 3:00 PM Medical Record Number: [id number] Patient Account Number: [id number] Date of Birth/Sex: [birth year] (70 y.o. Female) Treating RN: [name] Primary Care Physician: Glendon Axe Other Clinician: Referring Physician: Glendon Axe Treating Physician/Extender: Frann Rider in Treatment: 50 Wound Status Wound Number: 6 Primary Diabetic Wound/Ulcer of the Lower Etiology: Extremity Wound Location: Right Lower Leg - Medial Wound Open Wounding Event: Gradually Appeared Status: Date Acquired: 10/10/2015 Comorbid Cataracts, Asthma, Coronary Artery Weeks Of Treatment: 14 History: Disease, Hypertension, Type II Clustered Wound: No Diabetes, Osteoarthritis, Neuropathy Photos Photo Uploaded By:  [name] on 01/19/2016 17:11:00 Wound Measurements Length: (cm) 6.1 Width: (cm) 4 Depth: (cm) 0.2 Area: (cm) 19.164 Volume: (cm) 3.833 % Reduction in Area: -454.5% % Reduction in Volume: -1007.8% Epithelialization: None Tunneling: No Undermining: No Wound Description Classification: Grade 1 Wound Margin: Distinct, outline attached Exudate Amount: Medium Exudate Type: Purulent Exudate Color: yellow, brown, green Foul Odor After Cleansing: No Wound Bed Granulation Amount: Large (67-100%) Exposed Structure Granulation Quality: Red Fascia Exposed: No Necrotic Amount: Small (1-33%) Fat Layer Exposed: No Necrotic Quality: Eschar, Adherent Slough Tendon Exposed: No Briseno, Sadaf D. ([id number]) Muscle Exposed: No Joint  Exposed: No Bone Exposed: No Limited to Skin Breakdown Periwound Skin Texture Texture Color No Abnormalities Noted: No No Abnormalities Noted: No Callus: No Atrophie Blanche: No Crepitus: No Cyanosis: No Excoriation: No Ecchymosis: No Fluctuance: No Erythema: No Friable: No Hemosiderin Staining: No Induration: No Mottled: No Localized Edema: No Pallor: No Rash: No Rubor: No Scarring: No Temperature / Pain Moisture Temperature: No Abnormality No Abnormalities Noted: No Tenderness on Palpation: Yes Dry / Scaly: No Maceration: No Moist: Yes Wound Preparation Ulcer Cleansing: Rinsed/Irrigated with Saline Topical Anesthetic Applied: Other: lidocaine 4%, Treatment Notes Wound #6 (Right, Medial Lower Leg) 1. Cleansed with: Clean wound with Normal Saline 2. Anesthetic Topical Lidocaine 4% cream to wound bed prior to debridement 3. Peri-wound Care: Skin Prep 4. Dressing Applied: Santyl Ointment 5. Secondary Dressing Applied Bordered Foam Dressing Dry Gauze Electronic Signature(s) Signed: 01/19/2016 5:44:14 PM By: [name] Entered By: [name] on 01/19/2016 15:31:25 Johansson, Chandelle D.  ([id number]) -------------------------------------------------------------------------------- Vitals Details Patient Name: Kristin Stare D. Date of Service: 01/19/2016 3:00 PM Medical Record Number: [id number] Patient Account Number: [id number] Date of Birth/Sex: May 31, 1946 (70 y.o. Female) Treating RN: [name] Primary Care Physician: Glendon Axe Other Clinician: Referring Physician: Glendon Axe Treating Physician/Extender: Frann Rider in Treatment: 50 Vital Signs Time Taken: 15:16 Temperature (F): 98.4 Height (in): 65 Pulse (bpm): 82 Weight (lbs): 248 Respiratory Rate (breaths/min): 18 Body Mass Index (BMI): 41.3 Blood Pressure (mmHg): 157/66 Reference Range: 80 - 120 mg / dl Electronic Signature(s) Signed: 01/19/2016 5:44:14 PM By: [name] Entered By: [name] on 01/19/2016 15:17:42

## 2016-01-26 ENCOUNTER — Encounter: Payer: Commercial Managed Care - HMO | Attending: Surgery | Admitting: Surgery

## 2016-01-26 DIAGNOSIS — I2510 Atherosclerotic heart disease of native coronary artery without angina pectoris: Secondary | ICD-10-CM | POA: Diagnosis not present

## 2016-01-26 DIAGNOSIS — E7800 Pure hypercholesterolemia, unspecified: Secondary | ICD-10-CM | POA: Insufficient documentation

## 2016-01-26 DIAGNOSIS — E11622 Type 2 diabetes mellitus with other skin ulcer: Secondary | ICD-10-CM | POA: Insufficient documentation

## 2016-01-26 DIAGNOSIS — L97212 Non-pressure chronic ulcer of right calf with fat layer exposed: Secondary | ICD-10-CM | POA: Diagnosis not present

## 2016-01-26 DIAGNOSIS — I129 Hypertensive chronic kidney disease with stage 1 through stage 4 chronic kidney disease, or unspecified chronic kidney disease: Secondary | ICD-10-CM | POA: Insufficient documentation

## 2016-01-26 DIAGNOSIS — Z90710 Acquired absence of both cervix and uterus: Secondary | ICD-10-CM | POA: Insufficient documentation

## 2016-01-26 DIAGNOSIS — N189 Chronic kidney disease, unspecified: Secondary | ICD-10-CM | POA: Diagnosis not present

## 2016-01-26 DIAGNOSIS — I890 Lymphedema, not elsewhere classified: Secondary | ICD-10-CM | POA: Diagnosis not present

## 2016-01-26 DIAGNOSIS — I87311 Chronic venous hypertension (idiopathic) with ulcer of right lower extremity: Secondary | ICD-10-CM | POA: Diagnosis not present

## 2016-01-26 DIAGNOSIS — M316 Other giant cell arteritis: Secondary | ICD-10-CM | POA: Insufficient documentation

## 2016-01-26 NOTE — Progress Notes (Signed)
[name] ([id number]) Visit Report for 01/26/2016 Chief Complaint Document Details Patient Name: [name], [name] Date of Service: 01/26/2016 2:15 PM Medical Record Number: [id number] Patient Account Number: [id number] Date of Birth/Sex: May 14, 1946 (70 y.o. Female) Treating RN: [name] Primary Care Physician: Glendon Axe Other Clinician: Referring Physician: Glendon Axe Treating Physician/Extender: Frann Rider in Treatment: 22 Information Obtained from: Patient Chief Complaint Patient presents to the wound care center for a consult due non healing wound 70 year old patient comes with a history of having a ulcer on the left lower extremity for the past 4 weeks. she says she's had swelling of both lower extremities for about a year after she started having prednisone. 02/07/2015 -- her vascular appointments obtained were in the first and third week of June. she is able to go to [place] and we will try and get her some earlier appointments. Other than that nothing else has changed in her management. Electronic Signature(s) Signed: 01/26/2016 3:17:02 PM By: [name], [name] Entered By: [name] on 01/26/2016 15:17:02 [name], [name] ([id number]) -------------------------------------------------------------------------------- HPI Details Patient Name: [name] D. Date of Service: 01/26/2016 2:15 PM Medical Record Number: [id number] Patient Account Number: [id number] Date of Birth/Sex: Feb 14, 1946 (70 y.o. Female) Treating RN: [name] Primary Care Physician: Glendon Axe Other Clinician: Referring Physician: Glendon Axe Treating Physician/Extender: Frann Rider in Treatment: 73 History of Present Illness HPI Description: 70 year old patient who is known to have diabetes mellitus type 2, chronic renal insufficiency, coronary artery disease, hypertension, hypercholesterolemia, temporal arteritis and inflammatory arthritiss also has a  history of having a hysterectomy and some orthopedic related surgeries. The ulcer on the left lower extremity started off as a blister and then. Got progressively worse. She does not have any fever or chills and has not had any recent surgical intervention for this. Her last hemoglobin A1c was 10.1 in September 2015. She has been recently put on doxycycline by her PCP. She is now also allergic to doxycycline and was this was changed over to Keflex. 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 She does see a cardiologist and also takes a diuretic. 02/07/2015 her arterial and venous duplex studies to be done have dates been given as the first and third week of June. This is at [facility]. We are going to try and get early appointments at [facility]. other than that nothing has changed in her management. 02/14/2015 -- we have been able to get her an appointment in [facility] on May 20 which is much earlier than her previous ones at [place]. She continues with her prednisone and her sugars are in the range of 150-200. 02/21/2015 We were able to get a vascular lab workup for her today and she is going to be there at 2:00 this afternoon. the swelling of her leg has gone down significantly but she still has some tenderness over the wounds. 02/28/2015 - She has had one of two vascular workups done, and this coming Tuesday has another, at [facility]. She continues to be on steroid medications. She has significant sensitivity in her left lower extremity and has pain suggestive of neuropathic pain and I have asked her to address this with her primary care physician. 03/07/2015 -- The patient saw Dr. Lucky Cowboy for a consultation and he has had her arteries are okay but she has 2 incompetent veins on the  left lower extremity and he is going to set her up for surgery. Official  report is awaited. Addendum: Official reports are now available and on 03/04/2015. She was seen and lower extremity venous duplex exam was done. There was reflux present within the left greater saphenous vein below the knee and also the left small saphenous vein. Arterial duplex showed normal triphasic waveforms throughout the left lower extremity without any significant stenosis. Her ABIs were noncompressible bilaterally but a waveforms were normal and a digital pressures were normal bilaterally consistent with no significant arterial insufficiency. He has recommended endovenous ablation of both the left small saphenous and the left great saphenous vein. This would still be scheduled later. 03/14/2015 -- she has heard back from the vascular office and has surgery scheduled for sometime in July. Her rheumatologist has decreased her prednisone dosage but she still on it. She has also had cataract surgery in her right eye recently this week. [name], [name] ([id number]) 03/20/2015 - No new complaints today. Pain improved. No fever or chills. Tolerating 2 layer compression. 04/14/2015 -- she was doing very well today she went off on vacation and now her edema has increased markedly the ulceration is bigger and her diabetes is not under control. 04/21/2015 -- I spoke to her PCP Dr. Candiss Norse and discussed the management which would include being seen by a general surgeon for debridement and taking multiple punch biopsies which would help in establishing the diagnosis of this is a vasculitis. She is agreeable about this and will set her up for the procedure with Dr. Tamala Julian at [facility]. She was seen by the surgeon Dr. Jamal Collin. 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 She is scheduled to have laser ablation done next week.Pt was sent here for possible biopsy to look for vasculitis.  Feel it would be better to wait after laser ablation is completed- the ulcer may heal fully and biopsy may not be necessary 04/29/2015 -- she had the venous ablation done by Dr. Lucky Cowboy last Friday and we do not have any notes yet. She is doing fine otherwise. 05/06/2015 --Review of her recent vascular intervention shows that she was seen by Dr. Lucky Cowboy on 04/29/2015. The follow-up duplex which was done showed that both the great saphenous vein and the small saphenous vein remained patent with reflux consistent with an unsuccessful ablation. He has rescheduled her for another the endovenous ablation to be done in about 4 weekso time. 05/13/2015 -- he was seen by her surgeon Dr. Jamal Collin who asked her to continue with conservative therapy and he would speak to Dr. Lucky Cowboy about her management. Dr. Lucky Cowboy is going to schedule her surgery in the middle of August for a repeat endovenous ablation. Her pus culture from last week has grown : [place] her noted her sensitivity report but due to her multiple allergies I had tried clindamycin and she developed a rash with this too. She has been prescribed and anti-buttocks in the ER and is has it at home and she will let is know what she is going to be taking. 05/20/2015 -- she has developed a small spot on her right lower extremity but besides that it is not a full fledged ulceration. She did not get to see Dr. Lucky Cowboy last week and hopefully she will see him in the near future. 05/27/2015 -she is still awaiting her  appointment with Dr.Dew and her vascular procedure is not scheduled until August 19. She will be seeing her PCP tomorrow and I have asked her to convey our discussion so that she is aware that debridement has not been done yet. 06/03/2015 -- was seen by her rheumatologist Dr. Dorthula Matas, who has been treating her for temporal arteritis and in his note  has mentioned the possibility of vasculitis or pyoderma gangrenosum. He is lowering her prednisone to 12-1/2 mg for 1 month and then 10 mg per the next month. I will again make an attempt to speak to her PCP Dr. Candiss Norse and her surgeon Dr. 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 06/17/2015 -- Dr.Dew did her vascular procedure last week and a follow-up venous ultrasound shows good resolution of the veins as per the patient's history. He is to see her back in 2 weeks. 07/07/2015. -- the patient has had a heavy growth of Proteus mirabilis and Enterococcus faecalis. These Woodle, Kimorah D. ([id number]) are sensitive to several drugs but the problem is she has allergies to all of these and hence I would like her to see Dr. Ola Spurr for this. She is also due to see Dr. Lucky Cowboy tomorrow and I will discussed the management with him including debridement under anesthesia and possible biopsies. 07/14/2015 -- she has an appointment to see Dr. Ola Spurr tomorrow and did see Dr. Bunnie Domino PA who will discuss my request with him. 07/21/2015 -- saw Dr. Ola Spurr was able to do a test on her and has put her on amoxicillin. She has been tolerating that and has had no problems with allergies to this. 07/28/2015 -- Last Friday I spoke to Dr. Leotis Pain regarding her care and he said that her right leg did not need any surgery and on the left leg was doing pretty good. We did agree that if she undergoes any procedure in the future he would do a couple of punch biopsies of the wound. 08/18/2015 -- her right leg is very tender and there is significant amount of slough. The left leg is looking much better 09/02/2015 -- she is going to have a debridement and punch biopsies of her right lower extremity by Dr. Leotis Pain this coming Thursday. Also seen Dr. Ola Spurr who has continued her on ciprofloxacin. 09/09/2015 -- on  09/04/2015 Dr. Leotis Pain took her to surgery - Irrigation and excisional debridement of skin, soft tissue, and muscle to about 40 cm2 to the right posterior calf with biopsy. Pathology results are -- DIAGNOSIS: SKIN, RIGHT LOWER EXTREMITY; BIOPSY: SKIN AND SOFT TISSUE WITH ULCERATION, SEE NOTE. - NEGATIVE FOR DYSPLASIA AND MALIGNANCY. Note: There is acute inflammation and ulceration of the epidermis. The dermis shows nonspecific inflammation, neovascularization, and hemosiderin deposition. The differential diagnosis for these findings includes stasis dermatitis, nonspecific dermatitis, and infection. Correlation with clinical findings is required. A PAS fungal stain is obtained and results will be reported in an addendum. cultures were also sent and this grew Pseudomonas aeruginosa, Escherichia coli, Proteus mirabilis, Klebsiella pneumoniae and they were all sensitive to ciprofloxacin which she is on. 09/16/2015 -- he saw Dr. 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 He is going to call the patient in and discuss with her the management possibly with Imuran. I will continue treating  her locally. 09/23/2015 -- she saw Dr. Ola Spurr today who was going to continue the antibiotics for now and stop after this course. She has an appointment to see Dr. Jefm Bryant in about a week's time. Her wound VAC has arrived but she did not bring it with her today. We will try and set her up for changes to the right leg 3 times a week. She is here for her first application of Apligraf to the left lower extremity. 09/30/2015 -- she has seen Dr. 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 because she is ambulatory at home health will not apply a wound VAC and she will have to come here 3 times a week on  Monday Wednesday and Friday. 10/10/2015 -- she is here for a second application of Apligraf to her left lower extremity. 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 Jefm Bryant. Other than that she is doing well 10/31/2015 -- she is here for a third application of Apligraf to her left lower extremity. after reviewing her wound on the right side I noted that it is granulating extremely well and we will use the remnants of the Apligraf on the right leg. 11/14/2015 -- she was recently admitted to the hospital between January 11 and 11/10/2015 for Saintvil, Kjirsten D. ([id number]) uncontrolled hypertension, diabetes mellitus, acute kidney injury. during her admission she was supported by wound care and also by antibiotics. Around this time her immunosuppression was stopped by Dr. Nunzio Cory. She is feeling much better now. 11/21/2015 -- she has had her fourth application of Apligraf today and it has been shared between the left and the right leg 11/24/2015 -- was here for a wound VAC change but it was noticed that she had profuse bleeding from the lobe medial part of her right lower extremity and I was called in to evaluate her. 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 She has been doing fine since then 12/19/2015 -- she has finished 5 applications of Apligraf to the left lower extremity and this is doing great. He began with pain and swelling of her right ankle a couple of days ago and it has caused her much distress. She does not have any fever or any change in her general health. 12/22/2015 -- the patient's pain and swelling of the right lower extremity has resolved over the weekend. The left lower extremity is completely healed. She has had the first application of Apligraf on the right leg today. 01/02/2016 -- She is here for the second application  of Apligraf on the right leg today. 01/19/2016 -- she is here for the third application of Apligraf to the right posterior lateral leg. Electronic Signature(s) Signed: 01/26/2016 3:17:14 PM By: [name], [name] Entered By: [name] on 01/26/2016 15:17:14 [name], [name] ([id number]) -------------------------------------------------------------------------------- Physical Exam Details Patient Name: ALEXIE, MANGRUM D. Date of Service: 01/26/2016 2:15 PM Medical Record Number: [id number] Patient Account Number: [id number] Date of Birth/Sex: 12/02/1945 (70 y.o. Female) Treating RN: [name] Primary Care Physician: Glendon Axe Other Clinician: Referring Physician: Glendon Axe Treating Physician/Extender: Frann Rider in Treatment: 51 Constitutional . Pulse regular. Respirations normal and unlabored. Afebrile. . Eyes Nonicteric. Reactive to light. Ears, Nose, Mouth, and Throat Lips, teeth, and gums WNL.Marland [name] Moist mucosa without lesions. Neck supple and nontender.  No palpable supraclavicular or cervical adenopathy. Normal sized without goiter. Respiratory WNL. No retractions.. Cardiovascular Pedal Pulses WNL. No clubbing, cyanosis or edema. Lymphatic No adneopathy. No adenopathy. No adenopathy. Musculoskeletal Adexa without tenderness or enlargement.. Digits and nails w/o clubbing, cyanosis, infection, petechiae, ischemia, or inflammatory conditions.. Integumentary (Hair, Skin) No suspicious lesions. No crepitus or fluctuance. No peri-wound warmth or erythema. No masses.Marland [name] Psychiatric Judgement and insight Intact.. No evidence of depression, anxiety, or agitation.. Notes the right lower extremity medial wound continues to have healthy granulation tissue except for the most lateral part of the wound where she made some Santyl ointment. The right posterior leg is covered with Apligraf today. Electronic Signature(s) Signed: 01/26/2016 3:17:52 PM By: [name],  [name] Entered By: [name] on 01/26/2016 15:17:51 [name], [name] ([id number]) -------------------------------------------------------------------------------- Physician Orders Details Patient Name: RHIANA, MACEDO D. Date of Service: 01/26/2016 2:15 PM Medical Record Number: [id number] Patient Account Number: [id number] Date of Birth/Sex: [birth year] (70 y.o. Female) Treating RN: [name] Primary Care Physician: Glendon Axe Other Clinician: Referring Physician: Glendon Axe Treating Physician/Extender: Frann Rider in Treatment: 17 Verbal / Phone Orders: Yes Clinician: [name] Read Back and Verified: Yes Diagnosis Coding Wound Cleansing Wound #5 Right,Posterior Lower Leg o Cleanse wound with mild soap and water Wound #6 Right,Medial Lower Leg o Cleanse wound with mild soap and water Anesthetic Wound #5 Right,Posterior Lower Leg o Topical Lidocaine 4% cream applied to wound bed prior to debridement Wound #6 Right,Medial Lower Leg o Topical Lidocaine 4% cream applied to wound bed prior to debridement Skin Barriers/Peri-Wound Care Wound #5 Right,Posterior Lower Leg o Skin Prep Wound #6 Right,Medial Lower Leg o Skin Prep Primary Wound Dressing Wound #5 Right,Posterior Lower Leg o Dry Gauze o Boardered Foam Dressing o Drawtex Wound #6 Right,Medial Lower Leg o Dry Gauze o Santyl Ointment o Boardered Foam Dressing Dressing Change Frequency Wound #5 Right,Posterior Lower Leg o Change dressing every week [name], [name] ([id number]) Wound #6 Right,Medial Lower Leg o Change dressing every other day. Follow-up Appointments Wound #5 Right,Posterior Lower Leg o Return Appointment in 1 week. Wound #6 Right,Medial Lower Leg o Return Appointment in 1 week. Edema Control Wound #5 Right,Posterior Lower Leg o Elevate legs to the level of the heart and pump ankles as often as possible o Other: - wear compression hose Wound  #6 Right,Medial Lower Leg o Elevate legs to the level of the heart and pump ankles as often as possible o Other: - wear compression hose Electronic Signature(s) Signed: 01/26/2016 3:11:34 PM By: [name] Signed: 01/26/2016 3:32:20 PM By: [name], [name] Entered By: [name] on 01/26/2016 15:11:33 [name], [name] ([id number]) -------------------------------------------------------------------------------- Problem List Details Patient Name: NAVIYA, HETZ D. Date of Service: 01/26/2016 2:15 PM Medical Record Number: [id number] Patient Account Number: [id number] Date of Birth/Sex: [birth year] (70 y.o. Female) Treating RN: [name] Primary Care Physician: Glendon Axe Other Clinician: Referring Physician: Glendon Axe Treating Physician/Extender: Frann Rider in Treatment: 4 Active Problems ICD-10 Encounter Code Description Active Date Diagnosis E11.622 Type 2 diabetes mellitus with other skin ulcer 01/31/2015 Yes E66.01 Morbid (severe) obesity due to excess calories 01/31/2015 Yes I89.0 Lymphedema, not elsewhere classified 01/31/2015 Yes L97.212 Non-pressure chronic ulcer of right calf with fat layer 11/24/2015 Yes exposed I87.311 Chronic venous hypertension (idiopathic) with ulcer of 11/24/2015 Yes right lower extremity Inactive Problems Resolved Problems ICD-10 Code Description Active Date Resolved Date L97.322 Non-pressure chronic ulcer of left ankle with fat layer 01/31/2015 01/31/2015 exposed I83.222 Varicose veins  of left lower extremity with both ulcer of 03/07/2015 03/07/2015 calf and inflammation I83.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 with both ulcer of 03/07/2015 03/07/2015 ankle and inflammation KAORY, ASTON ([id number]) L03.116 Cellulitis of left lower limb 07/07/2015 07/07/2015 Electronic Signature(s) Signed: 01/26/2016 3:16:41 PM By: [name], [name] Entered By: [name] on 01/26/2016 15:16:40 [name], [name]  ([id number]) -------------------------------------------------------------------------------- Progress Note Details Patient Name: [name] Date of Service: 01/26/2016 2:15 PM Medical Record Number: [id number] Patient Account Number: [id number] Date of Birth/Sex: 07/21/1946 (70 y.o. Female) Treating RN: [name] Primary Care Physician: Glendon Axe Other Clinician: Referring Physician: Glendon Axe Treating Physician/Extender: Frann Rider in Treatment: 30 Subjective Chief Complaint Information obtained from Patient Patient presents to the wound care center for a consult due non healing wound 70 year old patient comes with a history of having a ulcer on the left lower extremity for the past 4 weeks. she says she's had swelling of both lower extremities for about a year after she started having prednisone. 02/07/2015 -- her vascular appointments obtained were in the first and third week of June. she is able to go to [place] and we will try and get her some earlier appointments. Other than that nothing else has changed in her management. History of Present Illness (HPI) 70 year old patient who is known to have diabetes mellitus type 2, chronic renal insufficiency, coronary artery disease, hypertension, hypercholesterolemia, temporal arteritis and inflammatory arthritiss also has a history of having a hysterectomy and some orthopedic related surgeries. The ulcer on the left lower extremity started off as a blister and then. Got progressively worse. She does not have any fever or chills and has not had any recent surgical intervention for this. Her last hemoglobin A1c was 10.1 in September 2015. She has been recently put on doxycycline by her PCP. She is now also allergic to doxycycline and was this was changed over to Keflex. due to her temporal arteritis she has been on prednisone for about a year and she says ever since that she has had swelling of both lower  extremities. She does see a cardiologist and also takes a diuretic. 02/07/2015 her arterial and venous duplex studies to be done have dates been given as the first and third week of June. This is at [facility]. We are going to try and get early appointments at [facility]. other than that nothing has changed in her management. 02/14/2015 -- we have been able to get her an appointment in [facility] on May 20 which is much earlier than her previous ones at [place]. She continues with her prednisone and her sugars are in the range of 150-200. 02/21/2015 We were able to get a vascular lab workup for her today and she is going to be there at 2:00 this afternoon. the swelling of her leg has gone down significantly but she still has some tenderness over the wounds. 02/28/2015 - She has had one of two vascular workups done, and this coming Tuesday has another, at [place] [facility]. She continues to be on steroid medications. She has significant sensitivity in her left lower extremity and has pain suggestive of neuropathic pain and I have asked her to address this with her primary care physician. 03/07/2015 -- The patient saw Dr. Lucky Cowboy for a consultation and he has had her arteries are okay but she has 2 incompetent veins on the left lower extremity and he is going to set her up for surgery. Official report is awaited. Addendum:  Official reports are now available and on 03/04/2015. She was seen and lower extremity [name], [name] ([id number]) venous duplex exam was done. There was reflux present within the left greater saphenous vein below the knee and also the left small saphenous vein. Arterial duplex showed normal triphasic waveforms throughout the left lower extremity without any significant stenosis. Her ABIs were noncompressible bilaterally but a waveforms were normal and a digital pressures were normal bilaterally consistent with no significant arterial insufficiency. He has  recommended endovenous ablation of both the left small saphenous and the left great saphenous vein. This would still be scheduled later. 03/14/2015 -- she has heard back from the vascular office and has surgery scheduled for sometime in July. Her rheumatologist has decreased her prednisone dosage but she still on it. She has also had cataract surgery in her right eye recently this week. 03/20/2015 - No new complaints today. Pain improved. No fever or chills. Tolerating 2 layer compression. 04/14/2015 -- she was doing very well today she went off on vacation and now her edema has increased markedly the ulceration is bigger and her diabetes is not under control. 04/21/2015 -- I spoke to her PCP Dr. Candiss Norse and discussed the management which would include being seen by a general surgeon for debridement and taking multiple punch biopsies which would help in establishing the diagnosis of this is a vasculitis. She is agreeable about this and will set her up for the procedure with Dr. Tamala Julian at [facility]. She was seen by the surgeon Dr. Jamal Collin. 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 She is scheduled to have laser ablation done next week.Pt was sent here for possible biopsy to look for vasculitis. Feel it would be better to wait after laser ablation is completed- the ulcer may heal fully and biopsy may not be necessary 04/29/2015 -- she had the venous ablation done by Dr. Lucky Cowboy last Friday and we do not have any notes yet. She is doing fine otherwise. 05/06/2015 --Review of her recent vascular intervention shows that she was seen by Dr. Lucky Cowboy on 04/29/2015. The follow-up duplex which was done showed that both the great saphenous vein and the small saphenous vein remained patent with reflux consistent with an unsuccessful ablation. He has rescheduled her for another the endovenous  ablation to be done in about 4 weeks time. 05/13/2015 -- he was seen by her surgeon Dr. Jamal Collin who asked her to continue with conservative therapy and he would speak to Dr. Lucky Cowboy about her management. Dr. Lucky Cowboy is going to schedule her surgery in the middle of August for a repeat endovenous ablation. Her pus culture from last week has grown : [place] her noted her sensitivity report but due to her multiple allergies I had tried clindamycin and she developed a rash with this too. She has been prescribed and anti-buttocks in the ER and is has it at home and she will let is know what she is going to be taking. 05/20/2015 -- she has developed a small spot on her right lower extremity but besides that it is not a full fledged ulceration. She did not get to see Dr. Lucky Cowboy last week and hopefully she will see him in the near future. 05/27/2015 -she is still awaiting her appointment with Dr.Dew and her vascular procedure is not scheduled until August 19. She will be seeing her  PCP tomorrow and I have asked her to convey our discussion so that she is aware that debridement has not been done yet. 06/03/2015 -- was seen by her rheumatologist Dr. 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 He is lowering her prednisone to 12-1/2 mg for 1 month and then 10 mg per the next month. I will again make an attempt to speak to her PCP Dr. Candiss Norse and her surgeon Dr. 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 06/17/2015 -- Dr.Dew did her vascular procedure last week and a follow-up venous ultrasound shows good resolution of the veins as per the patient's history. He is to see her back in  2 weeks. 07/07/2015. -- the patient has had a heavy growth of Proteus mirabilis and Enterococcus faecalis. These are sensitive to several drugs but the problem is she has allergies to all of these and hence I would like her to see Dr. Ola Spurr for this. She is also due to see Dr. Lucky Cowboy tomorrow and I will discussed the management with him including debridement under anesthesia and possible biopsies. 07/14/2015 -- she has an appointment to see Dr. Ola Spurr tomorrow and did see Dr. Bunnie Domino PA who will discuss my request with him. 07/21/2015 -- saw Dr. Ola Spurr was able to do a test on her and has put her on amoxicillin. She has been tolerating that and has had no problems with allergies to this. 07/28/2015 -- Last Friday I spoke to Dr. Leotis Pain regarding her care and he said that her right leg did not need any surgery and on the left leg was doing pretty good. We did agree that if she undergoes any procedure in the future he would do a couple of punch biopsies of the wound. 08/18/2015 -- her right leg is very tender and there is significant amount of slough. The left leg is looking much better 09/02/2015 -- she is going to have a debridement and punch biopsies of her right lower extremity by Dr. Leotis Pain this coming Thursday. Also seen Dr. Ola Spurr who has continued her on ciprofloxacin. 09/09/2015 -- on 09/04/2015 Dr. Leotis Pain took her to surgery - Irrigation and excisional debridement of skin, soft tissue, and muscle to about 40 cm2 to the right posterior calf with biopsy. Pathology results are -- DIAGNOSIS: SKIN, RIGHT LOWER EXTREMITY; BIOPSY: SKIN AND SOFT TISSUE WITH ULCERATION, SEE NOTE. - NEGATIVE FOR DYSPLASIA AND MALIGNANCY. Note: There is acute inflammation and ulceration of the epidermis. The dermis shows nonspecific inflammation, neovascularization, and hemosiderin deposition. The differential diagnosis for these findings includes stasis dermatitis, nonspecific dermatitis, and  infection. Correlation with clinical findings is required. A PAS fungal stain is obtained and results will be reported in an addendum. cultures were also sent and this grew Pseudomonas aeruginosa, Escherichia coli, Proteus mirabilis, Klebsiella pneumoniae and they were all sensitive to ciprofloxacin which she is on. 09/16/2015 -- he saw Dr. 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 He is going to call the patient in and discuss with her the management possibly with Imuran. I will continue treating her locally. 09/23/2015 -- she saw Dr. Ola Spurr today who was going to continue the antibiotics for now  and stop after this course. She has an appointment to see Dr. Jefm Bryant in about a week's time. Her wound VAC has arrived but she did not bring it with her today. We will try and set her up for changes to the right leg 3 times a week. She is here for her first application of Apligraf to the left lower extremity. 09/30/2015 -- she has seen Dr. 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 because she is ambulatory at home health [name], Kristin HANKES. ([id number]) will not apply a wound VAC and she will have to come here 3 times a week on Monday Wednesday and Friday. 10/10/2015 -- she is here for a second application of Apligraf to her left lower extremity. 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 Jefm Bryant. Other than that she is doing well 10/31/2015 -- she is here for a third application of Apligraf to her left lower extremity. after reviewing her wound on the right side I noted that it is granulating extremely well and we will use the remnants of the Apligraf on the right leg. 11/14/2015 -- she was recently admitted to the hospital  between January 11 and 11/10/2015 for uncontrolled hypertension, diabetes mellitus, acute kidney injury. during her admission she was supported by wound care and also by antibiotics. Around this time her immunosuppression was stopped by Dr. Nunzio Cory. She is feeling much better now. 11/21/2015 -- she has had her fourth application of Apligraf today and it has been shared between the left and the right leg 11/24/2015 -- was here for a wound VAC change but it was noticed that she had profuse bleeding from the lobe medial part of her right lower extremity and I was called in to evaluate her. 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 She has been doing fine since then 12/19/2015 -- she has finished 5 applications of Apligraf to the left lower extremity and this is doing great. He began with pain and swelling of her right ankle a couple of days ago and it has caused her much distress. She does not have any fever or any change in her general health. 12/22/2015 -- the patient's pain and swelling of the right lower extremity has resolved over the weekend. The left lower extremity is completely healed. She has had the first application of Apligraf on the right leg today. 01/02/2016 -- She is here for the second application of Apligraf on the right leg today. 01/19/2016 -- she is here for the third application of Apligraf to the right posterior lateral leg. Objective Constitutional Pulse regular. Respirations normal and unlabored. Afebrile. Vitals Time Taken: 2:25 PM, Height: 65 in, Weight: 248 lbs, BMI: 41.3, Temperature: 98.2 F, Pulse: 77 bpm, Respiratory Rate: 18 breaths/min, Blood Pressure: 153/69 mmHg. Eyes Nonicteric. Reactive to light. Ears, Nose, Mouth, and Throat Lips, teeth, and gums WNL.Marland [name] Moist mucosa without lesions. [name], [name] ([id number]) Neck supple and nontender. No palpable  supraclavicular or cervical adenopathy. Normal sized without goiter. Respiratory WNL. No retractions.. Cardiovascular Pedal Pulses WNL. No clubbing, cyanosis or edema. Lymphatic No adneopathy. No adenopathy. No adenopathy. Musculoskeletal Adexa without tenderness or enlargement.. Digits and nails w/o clubbing, cyanosis, infection, petechiae, ischemia, or inflammatory conditions.Marland [name] Psychiatric Judgement and insight Intact.. No evidence of depression, anxiety, or agitation.. General Notes: the  right lower extremity medial wound continues to have healthy granulation tissue except for the most lateral part of the wound where she made some Santyl ointment. The right posterior leg is covered with Apligraf today. Integumentary (Hair, Skin) No suspicious lesions. No crepitus or fluctuance. No peri-wound warmth or erythema. No masses.. Wound #5 status is Open. Original cause of wound was Gradually Appeared. The wound is located on the Right,Posterior Lower Leg. The wound measures 4.5cm length x 4.7cm width x 0.1cm depth; 16.611cm^2 area and 1.661cm^3 volume. Wound #6 status is Open. Original cause of wound was Gradually Appeared. The wound is located on the Right,Medial Lower Leg. The wound measures 6cm length x 3.8cm width x 0.2cm depth; 17.907cm^2 area and 3.581cm^3 volume. The wound is limited to skin breakdown. There is no tunneling or undermining noted. There is a small amount of serous drainage noted. The wound margin is flat and intact. There is large (67-100%) red granulation within the wound bed. There is a small (1-33%) amount of necrotic tissue within the wound bed including Eschar and Adherent Slough. The periwound skin appearance did not exhibit: Callus, Crepitus, Excoriation, Fluctuance, Friable, Induration, Localized Edema, Rash, Scarring, Dry/Scaly, Maceration, Moist, Atrophie Blanche, Cyanosis, Ecchymosis, Hemosiderin Staining, Mottled, Pallor, Rubor, Erythema. Periwound  temperature was noted as No Abnormality. The periwound has tenderness on palpation. 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 Next week she will be here for a fourth application of Apligraf and we will then review future plans. She is continuing with her Imuran and other supportive care as per her rheumatologist. Plan Wound Cleansing: Wound #5 Right,Posterior Lower Leg: Cleanse wound with mild soap and water Wound #6 Right,Medial Lower Leg: Cleanse wound with mild soap and water Anesthetic: Wound #5 Right,Posterior Lower Leg: Topical Lidocaine 4% cream applied to wound bed prior to debridement Wound #6 Right,Medial Lower Leg: Topical Lidocaine 4% cream applied to wound bed prior to debridement Skin Barriers/Peri-Wound Care: Wound #5 Right,Posterior Lower Leg: Skin Prep Wound #6 Right,Medial Lower Leg: Skin Prep Primary Wound Dressing: Wound #5 Right,Posterior Lower Leg: Dry Gauze Boardered Foam Dressing Drawtex Wound #6 Right,Medial Lower Leg: Dry Gauze Santyl Ointment Boardered Foam Dressing Dressing Change Frequency: Wound #5 Right,Posterior Lower Leg: Change dressing every week Wound #6 Right,Medial Lower Leg: Change dressing every other day. Follow-up Appointments: Wound #5 Right,Posterior Lower Leg: FOSTINE, BRESNAHAN. ([id number]) Return Appointment in 1 week. Wound #6 Right,Medial Lower Leg: Return Appointment in 1 week. Edema Control: Wound #5 Right,Posterior Lower Leg: Elevate legs to the level of the heart and pump ankles as often  as possible Other: - wear compression hose Wound #6 Right,Medial Lower Leg: Elevate legs to the level of the heart and pump ankles as often as possible Other: - wear compression hose We will continue with Santyl ointment on the anterior medial wound and continue with compression. Next week she will be here for a fourth application of Apligraf and we will then review future plans. She is continuing with her Imuran and other supportive care as per her rheumatologist. Electronic Signature(s) Signed: 01/26/2016 3:18:54 PM By: [name], [name] Entered By: [name]  on 01/26/2016 15:18:54 Primm, Bryn D. ([id number]) -------------------------------------------------------------------------------- SuperBill Details Patient Name: BREAWNA, STROHSCHEIN. Date of Service: 01/26/2016 Medical Record Number: [id number] Patient Account Number: [id number] Date of Birth/Sex: 1946-06-10 (70 y.o. Female) Treating RN: [name] Primary Care Physician: Glendon Axe Other Clinician: Referring Physician: Glendon Axe Treating Physician/Extender: Frann Rider in Treatment: 51 Diagnosis Coding ICD-10 Codes Code Description E11.622 Type 2 diabetes mellitus with other skin ulcer E66.01 Morbid (severe) obesity due to excess calories I89.0 Lymphedema, not elsewhere classified L97.212 Non-pressure chronic ulcer of right calf with fat layer exposed I87.311 Chronic venous hypertension (idiopathic) with ulcer of right lower extremity Facility Procedures CPT4 Code: [id number] Description: 99214 - WOUND CARE VISIT-LEV 4 EST PT Modifier: Quantity: 1 Physician Procedures CPT4 Code Description: [id number] - WC PHYS LEVEL 3 - EST PT ICD-10 Description Diagnosis E11.622 Type 2 diabetes mellitus with other skin ulcer I89.0 Lymphedema, not elsewhere classified L97.212 Non-pressure chronic ulcer of right calf with fat la  I87.311 Chronic venous hypertension (idiopathic) with ulcer Modifier: yer exposed of  right lower Quantity: 1 extremity Electronic Signature(s) Signed: 01/26/2016 3:19:51 PM By: [name], [name] Entered By: [name] on 01/26/2016 15:19:51

## 2016-01-27 NOTE — Progress Notes (Signed)
[name] ([id number]) Visit Report for 01/26/2016 Arrival Information Details Patient Name: [name], [name] Date of Service: 01/26/2016 2:15 PM Medical Record Number: [id number] Patient Account Number: [id number] Date of Birth/Sex: December 21, 1945 (70 y.o. Female) Treating RN: [name] Primary Care Physician: Glendon Axe Other Clinician: Referring Physician: Glendon Axe Treating Physician/Extender: Frann Rider in Treatment: 3 Visit Information History Since Last Visit Added or deleted any medications: No Patient Arrived: Cane Any new allergies or adverse reactions: No Arrival Time: 14:24 Had a fall or experienced change in No Accompanied By: self activities of daily living that may affect Transfer Assistance: None risk of falls: Patient Identification Verified: Yes Signs or symptoms of abuse/neglect since last No Secondary Verification Process Yes visito Completed: Hospitalized since last visit: No Patient Requires Transmission- No Pain Present Now: No Based Precautions: Patient Has Alerts: Yes Patient Alerts: Patient on Blood Thinner Electronic Signature(s) Signed: 01/26/2016 5:22:46 PM By: [name] Entered By: [name] on 01/26/2016 14:24:52 [name], [name] ([id number]) -------------------------------------------------------------------------------- Clinic Level of Care Assessment Details Patient Name: Kristin Stare D. Date of Service: 01/26/2016 2:15 PM Medical Record Number: [id number] Patient Account Number: [id number] Date of Birth/Sex: [birth year] (70 y.o. Female) Treating RN: [name] Primary Care Physician: Glendon Axe Other Clinician: Referring Physician: Glendon Axe Treating Physician/Extender: Frann Rider in Treatment: 13 Clinic Level of Care Assessment Items TOOL 4 Quantity Score []  - Use when only an EandM is performed on FOLLOW-UP visit 0 ASSESSMENTS - Nursing Assessment / Reassessment X - Reassessment of  Co-morbidities (includes updates in patient status) 1 10 X - Reassessment of Adherence to Treatment Plan 1 5 ASSESSMENTS - Wound and Skin Assessment / Reassessment []  - Simple Wound Assessment / Reassessment - one wound 0 X - Complex Wound Assessment / Reassessment - multiple wounds 2 5 []  - Dermatologic / Skin Assessment (not related to wound area) 0 ASSESSMENTS - Focused Assessment []  - Circumferential Edema Measurements - multi extremities 0 []  - Nutritional Assessment / Counseling / Intervention 0 X - Lower Extremity Assessment (monofilament, tuning fork, pulses) 1 5 []  - Peripheral Arterial Disease Assessment (using hand held doppler) 0 ASSESSMENTS - Ostomy and/or Continence Assessment and Care []  - Incontinence Assessment and Management 0 []  - Ostomy Care Assessment and Management (repouching, etc.) 0 PROCESS - Coordination of Care X - Simple Patient / Family Education for ongoing care 1 15 []  - Complex (extensive) Patient / Family Education for ongoing care 0 []  - Staff obtains Programmer, systems, Records, Test Results / Process Orders 0 []  - Staff telephones HHA, Nursing Homes / Clarify orders / etc 0 []  - Routine Transfer to another Facility (non-emergent condition) 0 [name], [name] ([id number]) []  - Routine Hospital Admission (non-emergent condition) 0 X - New Admissions / Biomedical engineer / Ordering NPWT, Apligraf, etc. 1 15 []  - Emergency Hospital Admission (emergent condition) 0 X - Simple Discharge Coordination 1 10 []  - Complex (extensive) Discharge Coordination 0 PROCESS - Special Needs []  - Pediatric / Minor Patient Management 0 []  - Isolation Patient Management 0 []  - Hearing / Language / Visual special needs 0 []  - Assessment of Community assistance (transportation, D/C planning, etc.) 0 []  - Additional assistance / Altered mentation 0 []  - Support Surface(s) Assessment (bed, cushion, seat, etc.) 0 INTERVENTIONS - Wound Cleansing / Measurement []  - Simple Wound  Cleansing - one wound 0 X - Complex Wound Cleansing - multiple wounds 2 5 X - Wound Imaging (photographs - any number of wounds) 1 5 []  -  Wound Tracing (instead of photographs) 0 []  - Simple Wound Measurement - one wound 0 X - Complex Wound Measurement - multiple wounds 2 5 INTERVENTIONS - Wound Dressings X - Small Wound Dressing one or multiple wounds 2 10 []  - Medium Wound Dressing one or multiple wounds 0 []  - Large Wound Dressing one or multiple wounds 0 X - Application of Medications - topical 1 5 []  - Application of Medications - injection 0 INTERVENTIONS - Miscellaneous []  - External ear exam 0 [name], [name] ([id number]) []  - Specimen Collection (cultures, biopsies, blood, body fluids, etc.) 0 []  - Specimen(s) / Culture(s) sent or taken to Lab for analysis 0 []  - Patient Transfer (multiple staff / [name]) 0 []  - Simple Staple / Suture removal (25 or less) 0 []  - Complex Staple / Suture removal (26 or more) 0 []  - Hypo / Hyperglycemic Management (close monitor of Blood Glucose) 0 []  - Ankle / Brachial Index (ABI) - do not check if billed separately 0 X - Vital Signs 1 5 Has the patient been seen at the hospital within the last three years: Yes Total Score: 125 Level Of Care: New/Established - Level 4 Electronic Signature(s) Signed: 01/26/2016 3:13:09 PM By: [name] Entered By: [name] on 01/26/2016 15:13:08 [name], [name] ([id number]) -------------------------------------------------------------------------------- Encounter Discharge Information Details Patient Name: [name], [name] Date of Service: 01/26/2016 2:15 PM Medical Record Number: [id number] Patient Account Number: [id number] Date of Birth/Sex: [birth year] (70 y.o. Female) Treating RN: [name] Primary Care Physician: Glendon Axe Other Clinician: Referring Physician: Glendon Axe Treating Physician/Extender: Frann Rider in Treatment: 38 Encounter Discharge  Information Items Discharge Pain Level: 0 Discharge Condition: Stable Ambulatory Status: Ambulatory Discharge Destination: Home Private Transportation: Auto Accompanied By: self Schedule Follow-up Appointment: Yes Medication Reconciliation completed and No provided to Patient/Care Meganne Rita: Clinical Summary of Care: Electronic Signature(s) Signed: 01/26/2016 3:15:08 PM By: [name] Entered By: [name] on 01/26/2016 15:15:08 [name], [name] ([id number]) -------------------------------------------------------------------------------- Lower Extremity Assessment Details Patient Name: Kristin Stare D. Date of Service: 01/26/2016 2:15 PM Medical Record Number: [id number] Patient Account Number: [id number] Date of Birth/Sex: [birth year] (70 y.o. Female) Treating RN: [name] Primary Care Physician: Glendon Axe Other Clinician: Referring Physician: Glendon Axe Treating Physician/Extender: Frann Rider in Treatment: 51 Edema Assessment Assessed: [Left: No] [Right: No] Edema: [Left: Ye] [Right: s] Vascular Assessment Pulses: Posterior Tibial Dorsalis Pedis Palpable: [Left:Yes] Extremity colors, hair growth, and conditions: Extremity Color: [Left:Hyperpigmented] Hair Growth on Extremity: [Left:No] Temperature of Extremity: [Left:Warm] Capillary Refill: [Left:< 3 seconds] Electronic Signature(s) Signed: 01/26/2016 5:22:46 PM By: [name] Entered By: [name] on 01/26/2016 14:42:31 [name], [name] ([id number]) -------------------------------------------------------------------------------- Multi Wound Chart Details Patient Name: Kristin Stare D. Date of Service: 01/26/2016 2:15 PM Medical Record Number: [id number] Patient Account Number: [id number] Date of Birth/Sex: [birth year] (70 y.o. 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] [N/A:N/A] Comorbid History: [5:N/A] [6:Cataracts, Asthma, Coronary Artery Disease, Hypertension, Type II Diabetes, Osteoarthritis, Neuropathy] [N/A:N/A] Date Acquired: [5:04/29/2015] [6:10/10/2015] [N/A:N/A] Weeks of Treatment: [5:35] [6:15] [N/A:N/A] Wound  Status: [5:Open] [6:Open] [N/A:N/A] Measurements L x W x D 4.5x4.7x0.1 [6:6x3.8x0.2] [N/A:N/A] (cm) Area (cm) : [5:16.611] [6:17.907] [N/A:N/A] Volume (cm) : [5:1.661] [6:3.581] [N/A:N/A] % Reduction in Area: [5:-8735.60%] [6:-418.10%] [N/A:N/A] % Reduction in Volume: -8642.10% [6:-935.00%] [N/A:N/A] Classification: [5:Full Thickness Without Exposed Support Structures] [6:Grade 1] [N/A:N/A] Exudate Amount: [5:N/A] [6:Small] [N/A:N/A] Exudate Type: [5:N/A] [6:Serous] [N/A:N/A] Exudate Color: [5:N/A] [6:amber] [N/A:N/A] Wound Margin: [5:N/A] [6:Flat and Intact] [N/A:N/A] Granulation Amount: [5:N/A] [6:Large (67-100%)] [N/A:N/A] Granulation Quality: [5:N/A] [6:Red] [N/A:N/A] Necrotic Amount: [5:N/A] [6:Small (1-33%)] [N/A:N/A] Necrotic Tissue: N/A Eschar, Adherent Slough N/A Epithelialization: N/A None N/A Periwound Skin Texture: No Abnormalities Noted Edema: No N/A Excoriation: No Induration: No Callus: No Crepitus: No Fluctuance: No Friable: No Rash: No Scarring: No Periwound Skin No Abnormalities Noted Maceration: No N/A Moisture: Moist: No Dry/Scaly: No Periwound Skin Color: No Abnormalities Noted Atrophie Blanche: No  N/A Cyanosis: No Ecchymosis: No Erythema: No Hemosiderin Staining: No Mottled: No Pallor: No Rubor: No Temperature: N/A No Abnormality N/A Tenderness on No Yes N/A Palpation: Wound Preparation: N/A Ulcer Cleansing: N/A Rinsed/Irrigated with Saline Topical Anesthetic Applied: Other: lidocaine 4% Treatment Notes Electronic Signature(s) Signed: 01/26/2016 3:08:55 PM By: [name] Entered By: [name] on 01/26/2016 15:08:54 [name], [name] ([id number]) -------------------------------------------------------------------------------- Multi-Disciplinary Care Plan Details Patient Name: [name], [name] D. Date of Service: 01/26/2016 2:15 PM Medical Record Number: [id number] Patient Account Number: [id number] Date of Birth/Sex: July 23, 1946 (70 y.o. Female) Treating RN: [name] Primary Care Physician: Glendon Axe Other Clinician: Referring Physician: Glendon Axe Treating Physician/Extender: Frann Rider in Treatment: 71 Active Inactive Orientation to the Wound Care Program Nursing Diagnoses: Knowledge deficit related to the wound healing center program Goals: Patient/caregiver will verbalize understanding of the [place] Program Date Initiated: 01/31/2015 Goal Status: Active Interventions: Provide education on orientation to the wound center Notes: Venous Leg Ulcer Nursing Diagnoses: Potential for venous Insuffiency (use before diagnosis confirmed) Goals: Non-invasive venous studies are completed as ordered Date Initiated: 01/31/2015 Goal Status: Active Patient/caregiver will verbalize understanding of disease process and disease management Date Initiated: 01/31/2015 Goal Status: Active Interventions: Assess peripheral edema status every visit. Notes: Wound/Skin Impairment Nursing Diagnoses: Impaired tissue integrity Knowledge deficit related to smoking impact on wound healing Alas, Kaede D. ([id number]) Goals: Patient/caregiver will  verbalize understanding of skin care regimen Date Initiated: 01/31/2015 Goal Status: Active Ulcer/skin breakdown will heal within 14 weeks Date Initiated: 01/31/2015 Goal Status: Active Interventions: Assess ulceration(s) every visit Notes: Electronic Signature(s) Signed: 01/26/2016 3:08:44 PM By: [name] Entered By: [name] on 01/26/2016 15:08:44 Tayloe, Malori D. ([id number]) -------------------------------------------------------------------------------- Patient/Caregiver Education Details Patient Name: KIMBLEY, KOWALEWSKI D. Date of Service: 01/26/2016 2:15 PM Medical Record Number: [id number] Patient Account Number: [id number] Date of Birth/Gender: July 29, 1946 (70 y.o. Female) Treating RN: [name] Primary Care Physician: Glendon Axe Other Clinician: Referring Physician: Glendon Axe Treating Physician/Extender: Frann Rider in Treatment: 80 Education Assessment Education Provided To: Patient Education Topics Provided Wound/Skin Impairment: Handouts: Other: wound care as ordered Methods: Explain/Verbal Responses: State content correctly Electronic Signature(s) Signed: 01/26/2016 3:15:28 PM By: [name] Entered By: [name] on 01/26/2016 15:15:28 [name], [name] ([id number]) -------------------------------------------------------------------------------- Wound Assessment Details Patient Name: [name], [name] Date of Service: 01/26/2016 2:15 PM Medical Record Number: [id number] Patient Account Number: [id number] Date of Birth/Sex: 1946-05-08 (70 y.o. Female) Treating RN: [name] Primary Care Physician: Glendon Axe Other Clinician: Referring Physician: Glendon Axe Treating Physician/Extender: Frann Rider in Treatment: 51 Wound Status Wound Number: 5 Primary Etiology: Venous Leg Ulcer Wound Location: Right,  Posterior Lower Leg Wound Status: Open Wounding Event: Gradually Appeared Date Acquired: 04/29/2015 Weeks Of  Treatment: 35 Clustered Wound: No Photos Photo Uploaded By: [name] on 01/26/2016 15:21:23 Wound Measurements Length: (cm) 4.5 Width: (cm) 4.7 Depth: (cm) 0.1 Area: (cm) 16.611 Volume: (cm) 1.661 % Reduction in Area: -8735.6% % Reduction in Volume: -8642.1% Wound Description Full Thickness Without Exposed Classification: Support Structures Periwound Skin Texture Texture Color No Abnormalities Noted: No No Abnormalities Noted: No Moisture No Abnormalities Noted: No Treatment Notes Wound #5 (Right, Posterior Lower Leg) [name], [name] ([id number]) 1. Cleansed with: Clean wound with Normal Saline 2. Anesthetic Topical Lidocaine 4% cream to wound bed prior to debridement 3. Peri-wound Care: Skin Prep 4. Dressing Applied: Other dressing (specify in notes) 5. Secondary Dressing Applied Bordered Foam Dressing Dry Gauze Notes drawtex Electronic Signature(s) Signed: 01/26/2016 5:22:46 PM By: [name] Entered By: [name] on 01/26/2016 14:35:41 [name], [name] ([id number]) -------------------------------------------------------------------------------- Wound Assessment Details Patient Name: [name], Emmy D. Date of Service: 01/26/2016 2:15 PM Medical Record Number: [id number] Patient Account Number: [id number] Date of Birth/Sex: July 04, 1946 (70 y.o. Female) Treating RN: [name] Primary Care Physician: Glendon Axe Other Clinician: Referring Physician: Glendon Axe Treating Physician/Extender: Frann Rider in Treatment: 68 Wound Status Wound Number: 6 Primary Diabetic Wound/Ulcer of the Lower Etiology: Extremity Wound Location: Right Lower Leg - Medial Wound Open Wounding Event: Gradually Appeared Status: Date Acquired: 10/10/2015 Comorbid Cataracts, Asthma, Coronary Artery Weeks Of Treatment: 15 History: Disease, Hypertension, Type II Clustered Wound: No Diabetes, Osteoarthritis, Neuropathy Photos Photo Uploaded By: [name] on 01/26/2016 15:21:24 Wound Measurements Length: (cm) 6 % Reduction in Width: (cm) 3.8 % Reduction in Depth: (cm) 0.2 Epithelializati Area: (cm) 17.907 Tunneling: Volume: (cm) 3.581 Undermining: Area: -418.1% Volume: -935% on: None No No Wound Description Classification: Grade 1 Foul Odor After Wound Margin: Flat and Intact Exudate Amount: Small Exudate Type: Serous Exudate Color: amber Cleansing: No Wound Bed Granulation Amount: Large (67-100%) Exposed Structure Granulation Quality: Red Fascia Exposed: No Necrotic Amount: Small (1-33%) Fat Layer Exposed: No Necrotic Quality: Eschar, Adherent Slough Tendon Exposed: No Giroux, Alora D. ([id number]) Muscle Exposed: No Joint Exposed: No Bone Exposed: No Limited to Skin Breakdown Periwound Skin Texture Texture Color No Abnormalities Noted: No No Abnormalities Noted: No Callus: No Atrophie Blanche: No Crepitus: No Cyanosis: No Excoriation: No Ecchymosis: No Fluctuance: No Erythema: No Friable: No Hemosiderin Staining: No Induration: No Mottled: No Localized Edema: No Pallor: No Rash: No Rubor: No Scarring: No Temperature / Pain Moisture Temperature: No Abnormality No Abnormalities Noted: No Tenderness on Palpation: Yes Dry / Scaly: No Maceration: No Moist: No Wound Preparation Ulcer Cleansing: Rinsed/Irrigated with Saline Topical Anesthetic Applied: Other: lidocaine 4%, Treatment Notes Wound #6 (Right, Medial Lower Leg) 1. Cleansed with: Clean wound with Normal Saline 2. Anesthetic Topical Lidocaine 4% cream to wound bed prior to debridement 4. Dressing Applied: Santyl Ointment 5. Secondary Dressing Applied Bordered Foam Dressing Dry Gauze Electronic Signature(s) Signed: 01/26/2016 5:22:46 PM By: [name] Entered By: [name] on 01/26/2016 14:41:52 Patton, Jarae D. ([id number]) -------------------------------------------------------------------------------- Vitals  Details Patient Name: Kristin Stare D. Date of Service: 01/26/2016 2:15 PM Medical Record Number: [id number] Patient Account Number: [id number] Date of Birth/Sex: 11/05/1945 (70 y.o. Female) Treating RN: [name] Primary Care Physician: Glendon Axe Other Clinician: Referring Physician: Glendon Axe Treating Physician/Extender: Frann Rider in Treatment: 51 Vital Signs Time Taken: 14:25 Temperature (F): 98.2 Height (in): 65 Pulse (bpm): 77 Weight (lbs): 248 Respiratory Rate (breaths/min): 18  Body Mass Index (BMI): 41.3 Blood Pressure (mmHg): 153/69 Reference Range: 80 - 120 mg / dl Electronic Signature(s) Signed: 01/26/2016 5:22:46 PM By: [name] Entered By: [name] on 01/26/2016 14:29:18

## 2016-02-02 ENCOUNTER — Encounter: Payer: Commercial Managed Care - HMO | Admitting: Surgery

## 2016-02-02 DIAGNOSIS — E11622 Type 2 diabetes mellitus with other skin ulcer: Secondary | ICD-10-CM | POA: Diagnosis not present

## 2016-02-02 NOTE — Progress Notes (Addendum)
RASEEL, LINDO ([id number]) Visit Report for 02/02/2016 Arrival Information Details Patient Name: [name], [name] Date of Service: 02/02/2016 12:45 PM Medical Record Number: [id number] Patient Account Number: [id number] Date of Birth/Sex: 28-Dec-1945 (70 y.o. Female) Treating RN: [name], RN, [name], [name] Primary Care Physician: Glendon Axe Other Clinician: Referring Physician: Glendon Axe Treating Physician/Extender: Frann Rider in Treatment: 72 Visit Information History Since Last Visit Added or deleted any medications: No Patient Arrived: Cane Any new allergies or adverse reactions: No Arrival Time: 12:52 Had a fall or experienced change in No Accompanied By: self activities of daily living that may affect Transfer Assistance: None risk of falls: Patient Identification Verified: Yes Signs or symptoms of abuse/neglect since last No Secondary Verification Process Yes visito Completed: Hospitalized since last visit: No Patient Requires Transmission- No Has Dressing in Place as Prescribed: Yes Based Precautions: Pain Present Now: Yes Patient Has Alerts: Yes Patient Alerts: Patient on Blood Thinner Electronic Signature(s) Signed: 02/02/2016 12:53:21 PM By: Regan Lemming [name], RN Entered By: Regan Lemming on 02/02/2016 12:53:21 Fantini, Trinita D. ([id number]) -------------------------------------------------------------------------------- Encounter Discharge Information Details Patient Name: [name], [name] Date of Service: 02/02/2016 12:45 PM Medical Record Number: [id number] Patient Account Number: [id number] Date of Birth/Sex: 11-21-1945 (70 y.o. Female) Treating RN: Baruch Gouty, RN, [name], [name] Primary Care Physician: Glendon Axe Other Clinician: Referring Physician: Glendon Axe Treating Physician/Extender: Frann Rider in Treatment: 52 Encounter Discharge Information Items Schedule Follow-up Appointment: No Medication Reconciliation completed No and provided to  Patient/Care Shail Urbas: Provided on Clinical Summary of Care: 02/02/2016 Form Type Recipient Paper Patient LM Electronic Signature(s) Signed: 02/02/2016 1:37:38 PM By: Ruthine Dose Entered By: Ruthine Dose on 02/02/2016 13:37:38 Dilley, Nisreen D. ([id number]) -------------------------------------------------------------------------------- Lower Extremity Assessment Details Patient Name: [name], [name] Date of Service: 02/02/2016 12:45 PM Medical Record Number: [id number] Patient Account Number: [id number] Date of Birth/Sex: October 19, 1946 (69 y.o. Female) Treating RN: [name], RN, [name], [name] Primary Care Physician: Glendon Axe Other Clinician: Referring Physician: Glendon Axe Treating Physician/Extender: Frann Rider in Treatment: 52 Vascular Assessment Pulses: Posterior Tibial Dorsalis Pedis Palpable: [Right:Yes] Extremity colors, hair growth, and conditions: Extremity Color: [Right:Hyperpigmented] Hair Growth on Extremity: [Right:No] Temperature of Extremity: [Right:Warm] Capillary Refill: [Right:< 3 seconds] Electronic Signature(s) Signed: 02/02/2016 12:54:42 PM By: Regan Lemming [name], RN Entered By: Regan Lemming on 02/02/2016 12:54:42 [place], Karely D. ([id number]) -------------------------------------------------------------------------------- Multi Wound Chart Details Patient Name: Kristin Stare D. Date of Service: 02/02/2016 12:45 PM Medical Record Number: [id number] Patient Account Number: [id number] Date of Birth/Sex: 07/01/1946 (70 y.o. 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] [N/A:N/A] Comorbid History: [5:Cataracts, Asthma, Coronary Artery Disease, Coronary Artery Disease, Hypertension, Type II Diabetes, Osteoarthritis, Diabetes, Osteoarthritis, Neuropathy] [6:Cataracts, Asthma, Hypertension, Type II Neuropathy] [N/A:N/A] Date Acquired: [5:04/29/2015] [6:10/10/2015] [N/A:N/A] Weeks of Treatment: [5:36] [6:16] [N/A:N/A] Wound Status: [5:Open] [6:Open] [N/A:N/A] Measurements L x W x D 4x4x0.1 [6:6.2x3.8x0.1] [N/A:N/A] (cm) Area (cm) : [5:12.566] [6:18.504] [N/A:N/A] Volume (cm) : [5:1.257] [6:1.85] [N/A:N/A] % Reduction in Area: [5:-6584.00%] [6:-435.40%] [N/A:N/A] % Reduction in Volume: -6515.80% [6:-434.70%] [N/A:N/A] Classification: [5:Full Thickness Without Exposed Support  Structures] [6:Grade 1] [N/A:N/A] HBO Classification: [5:Grade 2] [6:N/A] [N/A:N/A] Exudate Amount: [5:Medium] [6:Small] [N/A:N/A] Exudate Type: [5:Serosanguineous] [6:Serous] [N/A:N/A] Exudate Color: [5:red, brown] [6:amber] [N/A:N/A] Wound Margin: [5:Indistinct, nonvisible] [6:Flat and Intact] [N/A:N/A] Granulation Amount: [5:Small (1-33%)] [6:Medium (34-66%)] [N/A:N/A] Granulation Quality: [5:Pink, Pale] [6:Red] [N/A:N/A] Necrotic Amount: Medium (34-66%) Medium (34-66%) N/A Necrotic Tissue: Adherent Slough Eschar, Adherent Slough N/A Exposed Structures: Fascia: No Fascia: No N/A Fat: No Fat: No Tendon: No Tendon: No Muscle: No Muscle: No Joint: No Joint: No Bone: No Bone: No Limited to Skin Limited to Skin Breakdown Breakdown Epithelialization: None None N/A Periwound Skin Texture: Edema: Yes Edema: No N/A Excoriation: No Excoriation: No Induration: No Induration: No Callus: No Callus: No Crepitus: No Crepitus: No Fluctuance:  No Fluctuance: No Friable: No Friable: No Rash: No Rash: No Scarring: No Scarring: No Periwound Skin Moist: Yes Moist: Yes N/A Moisture: Maceration: No Maceration: No Dry/Scaly: No Dry/Scaly: No Periwound Skin Color: Atrophie Blanche: No Atrophie Blanche: No N/A Cyanosis: No Cyanosis: No Ecchymosis: No Ecchymosis: No Erythema: No Erythema: No Hemosiderin Staining: No Hemosiderin Staining: No Mottled: No Mottled: No Pallor: No Pallor: No Rubor: No Rubor: No Temperature: No Abnormality No Abnormality N/A Tenderness on No Yes N/A Palpation: Wound Preparation: Ulcer Cleansing: Ulcer Cleansing: N/A Rinsed/Irrigated with Rinsed/Irrigated with Saline Saline Topical Anesthetic Topical Anesthetic Applied: Other: lidocaine Applied: Other: lidocaine 4% cream 4% Treatment Notes Electronic Signature(s) Signed: 02/02/2016 2:54:24 PM By: Regan Lemming [name], RN Entered By: Regan Lemming on 02/02/2016 13:29:02 [place]berg, Tia Masker ([id number]) -------------------------------------------------------------------------------- Multi-Disciplinary Care Plan Details Patient Name: [name], [name] Date of Service: 02/02/2016 12:45 PM Medical Record Number: [id number] Patient Account Number: [id number] Date of Birth/Sex: 04/27/1946 (70 y.o. Female) Treating RN: [name], RN, [name], [name] Primary Care Physician: Glendon Axe Other Clinician: Referring Physician: Glendon Axe Treating Physician/Extender: Frann Rider in Treatment: 82 Active Inactive Orientation to the Wound Care Program Nursing Diagnoses: Knowledge deficit related to the wound healing center program Goals: Patient/caregiver will verbalize understanding of the [place] [facility] Date Initiated: 01/31/2015 Goal Status: Active Interventions: Provide education on orientation to the wound center Notes: Venous Leg Ulcer Nursing Diagnoses: Potential for venous Insuffiency (use before diagnosis  confirmed) Goals: Non-invasive venous studies are completed as ordered Date Initiated: 01/31/2015 Goal Status: Active Patient/caregiver will verbalize understanding of disease process and disease management Date Initiated: 01/31/2015 Goal Status: Active Interventions: Assess peripheral edema status every visit. Notes: Wound/Skin Impairment Nursing Diagnoses: Impaired tissue integrity Knowledge deficit related to smoking impact on wound healing Crafton, Fredda D. ([id number]) Goals: Patient/caregiver will verbalize understanding of skin care regimen Date Initiated: 01/31/2015 Goal Status: Active Ulcer/skin breakdown will heal within 14 weeks Date Initiated: 01/31/2015 Goal Status: Active Interventions: Assess ulceration(s) every visit Notes: Electronic Signature(s) Signed: 02/02/2016 2:54:24 PM By: Regan Lemming [name], RN Entered By: Regan Lemming on 02/02/2016 13:28:15 [name], [name] ([id number]) -------------------------------------------------------------------------------- Pain Assessment Details Patient Name: Kristin Stare D. Date of Service: 02/02/2016 12:45 PM Medical Record Number: [id number] Patient Account Number: [id number] Date of Birth/Sex: 06/16/1946 (70 y.o. Female) Treating RN: Baruch Gouty, RN, [name], [name] Primary Care Physician: Glendon Axe Other Clinician: Referring Physician: Glendon Axe Treating Physician/Extender: Frann Rider in Treatment: 47 Active Problems Location of Pain Severity and Description of Pain Patient Has Paino Yes Site Locations Pain Location: Pain in Ulcers Rate the pain. Current Pain Level: 3 Pain Management and Medication Current Pain Management: How does your pain impact your activities of daily livingo Sleep: Yes Bathing: Yes Appetite: Yes Relationship With Others: Yes  Bladder Continence: Yes Emotions: Yes Bowel Continence: Yes Work: Yes Toileting: Yes Drive: Yes Dressing: Yes Hobbies: Astronomer) Signed:  02/02/2016 12:53:36 PM By: Regan Lemming [name], RN Entered By: Regan Lemming on 02/02/2016 12:53:36 [name], [name] ([id number]) -------------------------------------------------------------------------------- Wound Assessment Details Patient Name: Kristin Stare D. Date of Service: 02/02/2016 12:45 PM Medical Record Number: [id number] Patient Account Number: [id number] Date of Birth/Sex: 07/26/1946 (70 y.o. Female) Treating RN: [name], RN, [name], [place] Primary Care Physician: Glendon Axe Other Clinician: Referring Physician: Glendon Axe Treating Physician/Extender: Frann Rider in Treatment: 80 Wound Status Wound Number: 5 Primary Venous Leg Ulcer Etiology: Wound Location: Right Lower Leg - Posterior Wound Open Wounding Event: Gradually Appeared Status: Date Acquired: 04/29/2015 Comorbid Cataracts, Asthma, Coronary Artery Weeks Of Treatment: 36 History: Disease, Hypertension, Type II Clustered Wound: No Diabetes, Osteoarthritis, Neuropathy Photos Photo Uploaded By: Regan Lemming on 02/02/2016 14:05:13 Wound Measurements Length: (cm) 4 Width: (cm) 4 Depth: (cm) 0.1 Area: (cm) 12.566 Volume: (cm) 1.257 % Reduction in Area: -6584% % Reduction in Volume: -6515.8% Epithelialization: None Tunneling: No Undermining: No Wound Description Full Thickness Without Exposed Foul Odor A Classification: Support Structures Diabetic Severity Grade 2 (Wagner): [name], [name] ([id number]) fter Cleansing: No Wound Margin: Indistinct, nonvisible Exudate Amount: Medium Exudate Type: Serosanguineous Exudate Color: red, brown Wound Bed Granulation Amount: Small (1-33%) Exposed Structure Granulation Quality: Pink, Pale Fascia Exposed: No Necrotic Amount: Medium (34-66%) Fat Layer Exposed: No Necrotic Quality: Adherent Slough Tendon Exposed: No Muscle Exposed: No Joint Exposed: No Bone Exposed: No Limited to Skin Breakdown Periwound Skin Texture Texture Color No Abnormalities  Noted: No No Abnormalities Noted: No Callus: No Atrophie Blanche: No Crepitus: No Cyanosis: No Excoriation: No Ecchymosis: No Fluctuance: No Erythema: No Friable: No Hemosiderin Staining: No Induration: No Mottled: No Localized Edema: Yes Pallor: No Rash: No Rubor: No Scarring: No Temperature / Pain Moisture Temperature: No Abnormality No Abnormalities Noted: No Dry / Scaly: No Maceration: No Moist: Yes Wound Preparation Ulcer Cleansing: Rinsed/Irrigated with Saline Topical Anesthetic Applied: Other: lidocaine 4% cream, Electronic Signature(s) Signed: 02/02/2016 2:54:24 PM By: Regan Lemming [name], RN Entered By: Regan Lemming on 02/02/2016 13:08:01 [name], [name] ([id number]) -------------------------------------------------------------------------------- Wound Assessment Details Patient Name: [name], [name] Date of Service: 02/02/2016 12:45 PM Medical Record Number: [id number] Patient Account Number: [id number] Date of Birth/Sex: September 28, 1946 (70 y.o. Female) Treating RN: [name], RN, [name], [place] Primary Care Physician: Glendon Axe Other Clinician: Referring Physician: Glendon Axe Treating Physician/Extender: Frann Rider in Treatment: 54 Wound Status Wound Number: 6 Primary Diabetic Wound/Ulcer of the Lower Etiology: Extremity Wound Location: Right Lower Leg - Medial Wound Open Wounding Event: Gradually Appeared Status: Date Acquired: 10/10/2015 Comorbid Cataracts, Asthma, Coronary Artery Weeks Of Treatment: 16 History: Disease, Hypertension, Type II Clustered Wound: No Diabetes, Osteoarthritis, Neuropathy Photos Photo Uploaded By: Regan Lemming on 02/02/2016 14:05:14 Wound Measurements Length: (cm) 6.2 Width: (cm) 3.8 Depth: (cm) 0.1 Area: (cm) 18.504 Volume: (cm) 1.85 % Reduction in Area: -435.4% % Reduction in Volume: -434.7% Epithelialization: None Tunneling: No Undermining: No Wound Description Classification: Grade 1 Foul Odor Aft Wound  Margin: Flat and Intact Exudate Amount: Small Exudate Type: Serous [name], [name] ([id number]) er Cleansing: No Exudate Color: amber Wound Bed Granulation Amount: Medium (34-66%) Exposed Structure Granulation Quality: Red Fascia Exposed: No Necrotic Amount: Medium (34-66%) Fat Layer Exposed: No Necrotic Quality: Eschar, Adherent Slough Tendon Exposed: No Muscle Exposed: No Joint Exposed: No Bone Exposed: No Limited to Skin Breakdown Periwound Skin Texture Texture Color No Abnormalities Noted:  No No Abnormalities Noted: No Callus: No Atrophie Blanche: No Crepitus: No Cyanosis: No Excoriation: No Ecchymosis: No Fluctuance: No Erythema: No Friable: No Hemosiderin Staining: No Induration: No Mottled: No Localized Edema: No Pallor: No Rash: No Rubor: No Scarring: No Temperature / Pain Moisture Temperature: No Abnormality No Abnormalities Noted: No Tenderness on Palpation: Yes Dry / Scaly: No Maceration: No Moist: Yes Wound Preparation Ulcer Cleansing: Rinsed/Irrigated with Saline Topical Anesthetic Applied: Other: lidocaine 4%, Electronic Signature(s) Signed: 02/02/2016 2:54:24 PM By: Regan Lemming [name], RN Entered By: Regan Lemming on 02/02/2016 13:08:22 [name], [name] ([id number]) -------------------------------------------------------------------------------- Vitals Details Patient Name: Kristin Stare D. Date of Service: 02/02/2016 12:45 PM Medical Record Number: [id number] Patient Account Number: [id number] Date of Birth/Sex: [birth year] (70 y.o. Female) Treating RN: [name], RN, [name], [employer] Primary Care Physician: Glendon Axe Other Clinician: Referring Physician: Glendon Axe Treating Physician/Extender: Frann Rider in Treatment: 52 Vital Signs Time Taken: 13:00 Temperature (F): 97.7 Height (in): 65 Pulse (bpm): 67 Weight (lbs): 248 Respiratory Rate (breaths/min): 18 Body Mass Index (BMI): 41.3 Blood Pressure (mmHg): 144/52 Reference Range:  80 - 120 mg / dl Electronic Signature(s) Signed: 02/02/2016 2:54:24 PM By: Regan Lemming [name], RN Entered By: Regan Lemming on 02/02/2016 13:01:24

## 2016-02-02 NOTE — Progress Notes (Addendum)
DDNNA, KOHR ([id number]) Visit Report for 02/02/2016 Chief Complaint Document Details Patient Name: [name], [name] 02/02/2016 12:45 Date of Service: PM Medical Record [id number] Number: Patient Account Number: [id number] [birth year] (70 y.o. [name], RN, [name], Date of Birth/Sex: Treating RN: Female) Administrator, sports Primary Care Physician: Glendon Axe Other Clinician: Referring Physician: Glendon Axe Treating [name] Tanori Physician/Extender: Weeks in Treatment: 48 Information Obtained from: Patient Chief Complaint Patient presents to the wound care center for a consult due non healing wound 70 year old patient comes with a history of having a ulcer on the left lower extremity for the past 4 weeks. she says she's had swelling of both lower extremities for about a year after she started having prednisone. 02/07/2015 -- her vascular appointments obtained were in the first and third week of June. she is able to go to [place] and we will try and get her some earlier appointments. Other than that nothing else has changed in her management. Electronic Signature(s) Signed: 02/02/2016 1:38:00 PM By: [name], [name] Entered By: [name] on 02/02/2016 13:38:00 Cerniglia, DESHAYLA HELKE ([id number]) -------------------------------------------------------------------------------- Cellular or Tissue Based Product Details Patient Name: [name], [name] 02/02/2016 12:45 Date of Service: PM Medical Record [id number] Number: Patient Account Number: [id number] 12-01-1945 (70 y.o. [name], RN, [name], Date of Birth/Sex: Treating RN: Female) Administrator, sports Primary Care Physician: Glendon Axe Other Clinician: Referring Physician: Glendon Axe Treating Rolando Hessling Physician/Extender: Weeks in Treatment: 16 Cellular or Tissue Based Wound #5 Right,Posterior Lower Leg Product Type Applied to: Performed By: Physician [name], MD Cellular or Tissue Based Apligraf Product Type: Time-Out Taken:  Yes Location: trunk / arms / legs Wound Size (sq cm): 16 Product Size (sq cm): 22 Waste Size (sq cm): 0 Amount of Product Applied (sq cm): 22 Lot #: [id number] Expiration Date: 02/07/2016 Fenestrated: Yes Instrument: Blade Reconstituted: Yes Solution Type: saline Solution Amount: 5ml Lot #: [id number] Solution Expiration 10/25/2017 Date: Secured: Yes Secured With: Steri-Strips Dressing Applied: Yes Primary Dressing: mepitel 1 Procedural Pain: 0 Post Procedural Pain: 0 Response to Treatment: Procedure was tolerated well Post Procedure Diagnosis Same as Pre-procedure Electronic Signature(s) Signed: 02/02/2016 1:37:43 PM By: [name], [name] Entered By: [name] on 02/02/2016 13:37:43 Milosevic, JOSILIN SETHER ([id number]) Cage, Melvinia D. ([id number]) -------------------------------------------------------------------------------- Cellular or Tissue Based Product Details Patient Name: [name], [name] 02/02/2016 12:45 Date of Service: PM Medical Record [id number] Number: Patient Account Number: [id number] 1946-02-28 (70 y.o. [name], RN, [name], Date of Birth/Sex: Treating RN: Female) Administrator, sports Primary Care Physician: Glendon Axe Other Clinician: Referring Physician: Glendon Axe Treating Demetra Moya Physician/Extender: Weeks in Treatment: 38 Cellular or Tissue Based Wound #6 Right,Medial Lower Leg Product Type Applied to: Performed By: Physician [name], MD Cellular or Tissue Based Apligraf Product Type: Time-Out Taken: Yes Location: trunk / arms / legs Wound Size (sq cm): 23.56 Product Size (sq cm): 22 Waste Size (sq cm): 0 Amount of Product Applied (sq cm): 22 Lot #: [id number] Expiration Date: 02/07/2016 Fenestrated: Yes Instrument: Blade Reconstituted: Yes Solution Type: saline Solution Amount: 49ml Lot #: [id number] Solution Expiration 10/25/2017 Date: Secured: Yes Secured With: Steri-Strips Dressing Applied: Yes Primary Dressing: mepitel  1 Procedural Pain: 0 Post Procedural Pain: 0 Response to Treatment: Procedure was tolerated well Post Procedure Diagnosis Same as Pre-procedure Electronic Signature(s) Signed: 02/02/2016 1:37:53 PM By: [name], [name] Entered By: [name] on 02/02/2016 13:37:53 [name], [name] ([id number]) [name], [name] ([id number]) -------------------------------------------------------------------------------- HPI Details Patient Name: [name], [name] 02/02/2016 12:45 Date of Service: PM Medical Record [id number]  Number: Patient Account Number: [id number] Jun 23, 1946 (69 y.o. [name], RN, [name], Date of Birth/Sex: Treating RN: Female) Administrator, sports Primary Care Physician: Glendon Axe Other Clinician: Referring Physician: Glendon Axe Treating Nhyla Nappi Physician/Extender: Weeks in Treatment: 64 History of Present Illness HPI Description: 70 year old patient who is known to have diabetes mellitus type 2, chronic renal insufficiency, coronary artery disease, hypertension, hypercholesterolemia, temporal arteritis and inflammatory arthritiss also has a history of having a hysterectomy and some orthopedic related surgeries. The ulcer on the left lower extremity started off as a blister and then. Got progressively worse. She does not have any fever or chills and has not had any recent surgical intervention for this. Her last hemoglobin A1c was 10.1 in September 2015. She has been recently put on doxycycline by her PCP. She is now also allergic to doxycycline and was this was changed over to Keflex. 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 She does see a cardiologist and also takes a diuretic. 02/07/2015 her arterial and venous duplex studies to be done have dates been given as the first and third week of June. This is at [facility]. We are going to try and get early appointments  at [facility]. other than that nothing has changed in her management. 02/14/2015 -- we have been able to get her an appointment in [facility] on May 20 which is much earlier than her previous ones at [place]. She continues with her prednisone and her sugars are in the range of 150-200. 02/21/2015 We were able to get a vascular lab workup for her today and she is going to be there at 2:00 this afternoon. the swelling of her leg has gone down significantly but she still has some tenderness over the wounds. 02/28/2015 - She has had one of two vascular workups done, and this coming Tuesday has another, at [facility]. She continues to be on steroid medications. She has significant sensitivity in her left lower extremity and has pain suggestive of neuropathic pain and I have asked her to address this with her primary care physician. 03/07/2015 -- The patient saw Dr. Lucky Cowboy for a consultation and he has had her arteries are okay but she has 2 incompetent veins on the left lower extremity and he is going to set her up for surgery. Official report is awaited. Addendum: Official reports are now available and on 03/04/2015. She was seen and lower extremity venous duplex exam was done. There was reflux present within the left greater saphenous vein below the knee and also the left small saphenous vein. Arterial duplex showed normal triphasic waveforms throughout the left lower extremity without any significant stenosis. Her ABIs were noncompressible bilaterally but a waveforms were normal and a digital pressures were normal bilaterally consistent with no significant arterial insufficiency. He has recommended endovenous ablation of both the left small saphenous and the left great saphenous vein. This would still be scheduled later. 03/14/2015 -- she has heard back from the vascular office and has surgery scheduled for sometime in July. [name], [name] ([id number]) Her rheumatologist  has decreased her prednisone dosage but she still on it. She has also had cataract surgery in her right eye recently this week. 03/20/2015 - No new complaints today. Pain improved. No fever or chills. Tolerating 2 layer compression. 04/14/2015 -- she was doing very well today she went off on vacation and now her  edema has increased markedly the ulceration is bigger and her diabetes is not under control. 04/21/2015 -- I spoke to her PCP Dr. Candiss Norse and discussed the management which would include being seen by a general surgeon for debridement and taking multiple punch biopsies which would help in establishing the diagnosis of this is a vasculitis. She is agreeable about this and will set her up for the procedure with Dr. Tamala Julian at [facility]. She was seen by the surgeon Dr. Jamal Collin. 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 She is scheduled to have laser ablation done next week.Pt was sent here for possible biopsy to look for vasculitis. Feel it would be better to wait after laser ablation is completed- the ulcer may heal fully and biopsy may not be necessary 04/29/2015 -- she had the venous ablation done by Dr. Lucky Cowboy last Friday and we do not have any notes yet. She is doing fine otherwise. 05/06/2015 --Review of her recent vascular intervention shows that she was seen by Dr. Lucky Cowboy on 04/29/2015. The follow-up duplex which was done showed that both the great saphenous vein and the small saphenous vein remained patent with reflux consistent with an unsuccessful ablation. He has rescheduled her for another the endovenous ablation to be done in about 4 weekso time. 05/13/2015 -- he was seen by her surgeon Dr. Jamal Collin who asked her to continue with conservative therapy and he would speak to Dr. Lucky Cowboy about her management. Dr. Lucky Cowboy is going to schedule her surgery in the middle of August  for a repeat endovenous ablation. Her pus culture from last week has grown : Ross her noted her sensitivity report but due to her multiple allergies I had tried clindamycin and she developed a rash with this too. She has been prescribed and anti-buttocks in the ER and is has it at home and she will let is know what she is going to be taking. 05/20/2015 -- she has developed a small spot on her right lower extremity but besides that it is not a full fledged ulceration. She did not get to see Dr. Lucky Cowboy last week and hopefully she will see him in the near future. 05/27/2015 -she is still awaiting her appointment with Dr.Dew and her vascular procedure is not scheduled until August 19. She will be seeing her PCP tomorrow and I have asked her to convey our discussion so that she is aware that debridement has not been done yet. 06/03/2015 -- was seen by her rheumatologist Dr. 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 He is lowering her prednisone to 12-1/2 mg for 1 month and then 10 mg per the next month. I will again make an attempt to speak to her PCP Dr. Candiss Norse and her surgeon Dr. 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 06/17/2015 -- Dr.Dew did her vascular procedure last week and a follow-up venous ultrasound shows good resolution of the veins as per the patient's history. He is to see her back in 2 weeks. [name], [name] ([id number]) 07/07/2015. -- the patient has had a heavy growth of Proteus mirabilis and Enterococcus faecalis. These are sensitive to several drugs but the problem is she  has allergies to all of these and hence I would like her to see Dr. Ola Spurr for this. She is  also due to see Dr. Lucky Cowboy tomorrow and I will discussed the management with him including debridement under anesthesia and possible biopsies. 07/14/2015 -- she has an appointment to see Dr. Ola Spurr tomorrow and did see Dr. Bunnie Domino PA who will discuss my request with him. 07/21/2015 -- saw Dr. Ola Spurr was able to do a test on her and has put her on amoxicillin. She has been tolerating that and has had no problems with allergies to this. 07/28/2015 -- Last Friday I spoke to Dr. Leotis Pain regarding her care and he said that her right leg did not need any surgery and on the left leg was doing pretty good. We did agree that if she undergoes any procedure in the future he would do a couple of punch biopsies of the wound. 08/18/2015 -- her right leg is very tender and there is significant amount of slough. The left leg is looking much better 09/02/2015 -- she is going to have a debridement and punch biopsies of her right lower extremity by Dr. Leotis Pain this coming Thursday. Also seen Dr. Ola Spurr who has continued her on ciprofloxacin. 09/09/2015 -- on 09/04/2015 Dr. Leotis Pain took her to surgery - Irrigation and excisional debridement of skin, soft tissue, and muscle to about 40 cm2 to the right posterior calf with biopsy. Pathology results are -- DIAGNOSIS: SKIN, RIGHT LOWER EXTREMITY; BIOPSY: SKIN AND SOFT TISSUE WITH ULCERATION, SEE NOTE. - NEGATIVE FOR DYSPLASIA AND MALIGNANCY. Note: There is acute inflammation and ulceration of the epidermis. The dermis shows nonspecific inflammation, neovascularization, and hemosiderin deposition. The differential diagnosis for these findings includes stasis dermatitis, nonspecific dermatitis, and infection. Correlation with clinical findings is required. A PAS fungal stain is obtained and results will be reported in an addendum. cultures were also sent and this grew Pseudomonas aeruginosa, Escherichia coli, Proteus mirabilis, Klebsiella pneumoniae and  they were all sensitive to ciprofloxacin which she is on. 09/16/2015 -- he saw Dr. 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 He is going to call the patient in and discuss with her the management possibly with Imuran. I will continue treating her locally. 09/23/2015 -- she saw Dr. Ola Spurr today who was going to continue the antibiotics for now and stop after this course. She has an appointment to see Dr. Jefm Bryant in about a week's time. Her wound VAC has arrived but she did not bring it with her today. We will try and set her up for changes to the right leg 3 times a week. She is here for her first application of Apligraf to the left lower extremity. 09/30/2015 -- she has seen Dr. 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 because she is ambulatory at home health will not apply a wound VAC and she will have to come here 3 times a week on Monday Wednesday and Friday. 10/10/2015 -- she is here for a second application of Apligraf to her left lower extremity. 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 Jefm Bryant. Other than that she is doing well 10/31/2015 -- she is here for a third application of Apligraf to her left lower extremity. after reviewing her wound  on the right side I noted that it is granulating extremely well and we will use the remnants of the Apligraf on the right leg. [name], [name] ([id number]) 11/14/2015 -- she was recently admitted to the hospital between January 11 and 11/10/2015 for uncontrolled hypertension, diabetes mellitus, acute kidney injury. during her admission she was supported by wound care and also by antibiotics. Around this time her immunosuppression was stopped by Dr. Nunzio Cory. She is  feeling much better now. 11/21/2015 -- she has had her fourth application of Apligraf today and it has been shared between the left and the right leg 11/24/2015 -- was here for a wound VAC change but it was noticed that she had profuse bleeding from the lobe medial part of her right lower extremity and I was called in to evaluate her. 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 She has been doing fine since then 12/19/2015 -- she has finished 5 applications of Apligraf to the left lower extremity and this is doing great. He began with pain and swelling of her right ankle a couple of days ago and it has caused her much distress. She does not have any fever or any change in her general health. 12/22/2015 -- the patient's pain and swelling of the right lower extremity has resolved over the weekend. The left lower extremity is completely healed. She has had the first application of Apligraf on the right leg today. 01/02/2016 -- She is here for the second application of Apligraf on the right leg today. 01/19/2016 -- she is here for the third application of Apligraf to the right posterior lateral leg. 02/02/2016 -- today she has had her fourth application of Apligraf and we have applied some of it to the right posterior lateral leg and the remaining waist we have used for the left anterior medial leg. Electronic Signature(s) Signed: 02/02/2016 1:38:50 PM By: [name], [name] Entered By: [name] on 02/02/2016 13:38:50 [name], [name] ([id number]) -------------------------------------------------------------------------------- Physical Exam Details Patient Name: [name], [name] 02/02/2016 12:45 Date of Service: PM Medical Record [id number] Number: Patient Account Number: [id number] 09/16/1946 (69 y.o. [name], RN, [name], Date of Birth/Sex: Treating RN: Female) [place] Primary Care Physician: Glendon Axe Other Clinician: Referring Physician: Candiss Norse, [name] Treating Rhonin Trott Physician/Extender: Weeks in Treatment: 52 Constitutional . Pulse regular. Respirations normal and unlabored. Afebrile. . Eyes Nonicteric. Reactive to light. Ears, Nose, Mouth, and Throat Lips, teeth, and gums WNL.Marland Kitchen Moist mucosa without lesions. Neck supple and nontender. No palpable supraclavicular or cervical adenopathy. Normal sized without goiter. Respiratory WNL. No retractions.. Cardiovascular Pedal Pulses WNL. No clubbing, cyanosis or edema. Lymphatic No adneopathy. No adenopathy. No adenopathy. Musculoskeletal Adexa without tenderness or enlargement.. Digits and nails w/o clubbing, cyanosis, infection, petechiae, ischemia, or inflammatory conditions.. Integumentary (Hair, Skin) No suspicious lesions. No crepitus or fluctuance. No peri-wound warmth or erythema. No masses.Marland Kitchen Psychiatric Judgement and insight Intact.. No evidence of depression, anxiety, or agitation.. Notes the right lower extremity posterior lateral wound has some hyper granulation tissue and this has been cauterized with silver nitrate stick and then for the open areas have applied out of the Apligraf. The rest of the Apligraf was applied to the left anterior medial wound and both wounds were dressed with appropriate bolsters and Steri-Strips. Electronic Signature(s) Signed: 02/02/2016 1:39:47 PM By: [name], [name] Entered By: [name] on 02/02/2016 13:39:47 Hunsucker, Nour DMarland Kitchen ([id number]) -------------------------------------------------------------------------------- Physician Orders  Details Patient Name: NIMSY, DROSS 02/02/2016 12:45 Date of Service: PM Medical Record [id number] Number: Patient Account Number: [id number] [birth year] (70 y.o. [name], RN, [name], Date of Birth/Sex: Treating RN: Female) Administrator, sports Primary Care Physician: Glendon Axe Other Clinician: Referring Physician: Glendon Axe  Treating Afton Mikelson Physician/Extender: Weeks in Treatment: 28 Verbal / Phone Orders: Yes Clinician: [name], RN, [name], Rita Read Back and Verified: Yes Diagnosis Coding Wound Cleansing Wound #5 Right,Posterior Lower Leg o Cleanse wound with mild soap and water Wound #6 Right,Medial Lower Leg o Cleanse wound with mild soap and water Anesthetic Wound #5 Right,Posterior Lower Leg o Topical Lidocaine 4% cream applied to wound bed prior to debridement Wound #6 Right,Medial Lower Leg o Topical Lidocaine 4% cream applied to wound bed prior to debridement Skin Barriers/Peri-Wound Care Wound #5 Right,Posterior Lower Leg o Skin Prep Wound #6 Right,Medial Lower Leg o Skin Prep Secondary Dressing Wound #5 Right,Posterior Lower Leg o Gauze and Kerlix/Conform Wound #6 Right,Medial Lower Leg o Gauze and Kerlix/Conform Dressing Change Frequency Wound #5 Right,Posterior Lower Leg o Change dressing every week Wound #6 Right,Medial Lower Leg o Change dressing every week Cupit, Jlyn D. ([id number]) Follow-up Appointments Wound #5 Right,Posterior Lower Leg o Return Appointment in 1 week. Wound #6 Right,Medial Lower Leg o Return Appointment in 1 week. Edema Control Wound #5 Right,Posterior Lower Leg o Elevate legs to the level of the heart and pump ankles as often as possible o Other: - Lightly wrapped with coban. Wear compression hose Wound #6 Right,Medial Lower Leg o Elevate legs to the level of the heart and pump ankles as often as possible o Other: - Lightly wrapped with coban. Wear compression hose Advanced Therapies Wound #5 Right,Posterior Lower Leg o Apligraf application in clinic; including contact layer, fixation with steri strips, dry gauze and cover dressing. - Patient instructed to leave dressing in place till next visit. Not to get it wet. Wound #6 Right,Medial Lower Leg o Apligraf application in clinic; including contact layer, fixation  with steri strips, dry gauze and cover dressing. - Patient instructed to leave dressing in place till next visit. Not to get it wet. Electronic Signature(s) Signed: 02/02/2016 2:54:24 PM By: Regan Lemming [name], RN Signed: 02/02/2016 3:21:43 PM By: [name], [name] Entered By: Regan Lemming on 02/02/2016 13:40:59 Janco, HAVAH LAVEY ([id number]) -------------------------------------------------------------------------------- Problem List Details Patient Name: MARIALY, MCGARRITY 02/02/2016 12:45 Date of Service: PM Medical Record [id number] Number: Patient Account Number: [id number] 04/16/1946 (70 y.o. [name], RN, [name], Date of Birth/Sex: Treating RN: Female) Administrator, sports Primary Care Physician: Glendon Axe Other Clinician: Referring Physician: Glendon Axe Treating Lucielle Vokes Physician/Extender: Weeks in Treatment: 23 Active Problems ICD-10 Encounter Code Description Active Date Diagnosis E11.622 Type 2 diabetes mellitus with other skin ulcer 01/31/2015 Yes E66.01 Morbid (severe) obesity due to excess calories 01/31/2015 Yes I89.0 Lymphedema, not elsewhere classified 01/31/2015 Yes L97.212 Non-pressure chronic ulcer of right calf with fat layer 11/24/2015 Yes exposed I87.311 Chronic venous hypertension (idiopathic) with ulcer of 11/24/2015 Yes right lower extremity Inactive Problems Resolved Problems ICD-10 Code Description Active Date Resolved Date L97.322 Non-pressure chronic ulcer of left ankle with fat layer 01/31/2015 01/31/2015 exposed I83.222 Varicose veins of left lower extremity with both ulcer of 03/07/2015 03/07/2015 calf and inflammation I83.223 03/07/2015 03/07/2015 Boettner, Madisan D. ([id number]) 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 L03.116 Cellulitis of left lower limb 07/07/2015 07/07/2015 Electronic Signature(s) Signed: 02/02/2016 1:37:34 PM By: [name], [name] Entered By: [name] on 02/02/2016  13:37:34 ZENAE, WILKENS  ([id number]) -------------------------------------------------------------------------------- Progress Note Details Patient Name: KAMAIRA, SIMICH 02/02/2016 12:45 Date of Service: PM Medical Record [id number] Number: Patient Account Number: [id number] 1946/06/16 (70 y.o. [name], RN, [name], Date of Birth/Sex: Treating RN: Female) Administrator, sports Primary Care Physician: Glendon Axe Other Clinician: Referring Physician: Glendon Axe Treating Tanay Massiah Physician/Extender: Weeks in Treatment: 29 Subjective Chief Complaint Information obtained from Patient Patient presents to the wound care center for a consult due non healing wound 70 year old patient comes with a history of having a ulcer on the left lower extremity for the past 4 weeks. she says she's had swelling of both lower extremities for about a year after she started having prednisone. 02/07/2015 -- her vascular appointments obtained were in the first and third week of June. she is able to go to [place] and we will try and get her some earlier appointments. Other than that nothing else has changed in her management. History of Present Illness (HPI) 70 year old patient who is known to have diabetes mellitus type 2, chronic renal insufficiency, coronary artery disease, hypertension, hypercholesterolemia, temporal arteritis and inflammatory arthritiss also has a history of having a hysterectomy and some orthopedic related surgeries. The ulcer on the left lower extremity started off as a blister and then. Got progressively worse. She does not have any fever or chills and has not had any recent surgical intervention for this. Her last hemoglobin A1c was 10.1 in September 2015. She has been recently put on doxycycline by her PCP. She is now also allergic to doxycycline and was this was changed over to Keflex. due to her temporal arteritis she has been on prednisone for about a year and she says ever since that she has had swelling of  both lower extremities. She does see a cardiologist and also takes a diuretic. 02/07/2015 her arterial and venous duplex studies to be done have dates been given as the first and third week of June. This is at [facility]. We are going to try and get early appointments at [facility]. other than that nothing has changed in her management. 02/14/2015 -- we have been able to get her an appointment in [facility] on May 20 which is much earlier than her previous ones at [place]. She continues with her prednisone and her sugars are in the range of 150-200. 02/21/2015 We were able to get a vascular lab workup for her today and she is going to be there at 2:00 this afternoon. the swelling of her leg has gone down significantly but she still has some tenderness over the wounds. 02/28/2015 - She has had one of two vascular workups done, and this coming Tuesday has another, at [facility]. She continues to be on steroid medications. She has significant sensitivity in her left lower extremity and has pain suggestive of neuropathic pain and I have asked her to address this with her primary care physician. 03/07/2015 -- The patient saw Dr. Lucky Cowboy for a consultation and he has had her arteries are okay but she has 2 incompetent veins on the left lower extremity and he is going to set her up for surgery. Official report is JEROLYN, OSTERLUND ([id number]) awaited. Addendum: Official reports are now available and on 03/04/2015. She was seen and lower extremity venous duplex exam was done. There was reflux present within the left greater saphenous vein below the knee and also the left small saphenous vein. Arterial duplex showed normal triphasic waveforms throughout the left lower extremity without any  significant stenosis. Her ABIs were noncompressible bilaterally but a waveforms were normal and a digital pressures were normal bilaterally consistent with no significant  arterial insufficiency. He has recommended endovenous ablation of both the left small saphenous and the left great saphenous vein. This would still be scheduled later. 03/14/2015 -- she has heard back from the vascular office and has surgery scheduled for sometime in July. Her rheumatologist has decreased her prednisone dosage but she still on it. She has also had cataract surgery in her right eye recently this week. 03/20/2015 - No new complaints today. Pain improved. No fever or chills. Tolerating 2 layer compression. 04/14/2015 -- she was doing very well today she went off on vacation and now her edema has increased markedly the ulceration is bigger and her diabetes is not under control. 04/21/2015 -- I spoke to her PCP Dr. Candiss Norse and discussed the management which would include being seen by a general surgeon for debridement and taking multiple punch biopsies which would help in establishing the diagnosis of this is a vasculitis. She is agreeable about this and will set her up for the procedure with Dr. Tamala Julian at [facility]. She was seen by the surgeon Dr. Jamal Collin. 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 She is scheduled to have laser ablation done next week.Pt was sent here for possible biopsy to look for vasculitis. Feel it would be better to wait after laser ablation is completed- the ulcer may heal fully and biopsy may not be necessary 04/29/2015 -- she had the venous ablation done by Dr. Lucky Cowboy last Friday and we do not have any notes yet. She is doing fine otherwise. 05/06/2015 --Review of her recent vascular intervention shows that she was seen by Dr. Lucky Cowboy on 04/29/2015. The follow-up duplex which was done showed that both the great saphenous vein and the small saphenous vein remained patent with reflux consistent with an unsuccessful ablation. He has rescheduled her  for another the endovenous ablation to be done in about 4 weeks time. 05/13/2015 -- he was seen by her surgeon Dr. Jamal Collin who asked her to continue with conservative therapy and he would speak to Dr. Lucky Cowboy about her management. Dr. Lucky Cowboy is going to schedule her surgery in the middle of August for a repeat endovenous ablation. Her pus culture from last week has grown : [place] her noted her sensitivity report but due to her multiple allergies I had tried clindamycin and she developed a rash with this too. She has been prescribed and anti-buttocks in the ER and is has it at home and she will let is know what she is going to be taking. 05/20/2015 -- she has developed a small spot on her right lower extremity but besides that it is not a full fledged ulceration. She did not get to see Dr. Lucky Cowboy last week and hopefully she will see him in the near future. 05/27/2015 -she is still awaiting her appointment with Dr.Dew and her vascular procedure is not scheduled until August 19. She will be seeing her PCP tomorrow and I have asked her to convey our discussion so that she is aware that debridement has not been done yet. ZHARICK, GALDO ([id number]) 06/03/2015 -- was seen by her rheumatologist Dr. Dorthula Matas, who has been treating her for temporal arteritis and in his note has mentioned the possibility of vasculitis  or pyoderma gangrenosum. He is lowering her prednisone to 12-1/2 mg for 1 month and then 10 mg per the next month. I will again make an attempt to speak to her PCP Dr. Candiss Norse and her surgeon Dr. 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 06/17/2015 -- Dr.Dew did her vascular procedure last week and a follow-up venous ultrasound shows good resolution of the veins as per the patient's  history. He is to see her back in 2 weeks. 07/07/2015. -- the patient has had a heavy growth of Proteus mirabilis and Enterococcus faecalis. These are sensitive to several drugs but the problem is she has allergies to all of these and hence I would like her to see Dr. Ola Spurr for this. She is also due to see Dr. Lucky Cowboy tomorrow and I will discussed the management with him including debridement under anesthesia and possible biopsies. 07/14/2015 -- she has an appointment to see Dr. Ola Spurr tomorrow and did see Dr. Bunnie Domino PA who will discuss my request with him. 07/21/2015 -- saw Dr. Ola Spurr was able to do a test on her and has put her on amoxicillin. She has been tolerating that and has had no problems with allergies to this. 07/28/2015 -- Last Friday I spoke to Dr. Leotis Pain regarding her care and he said that her right leg did not need any surgery and on the left leg was doing pretty good. We did agree that if she undergoes any procedure in the future he would do a couple of punch biopsies of the wound. 08/18/2015 -- her right leg is very tender and there is significant amount of slough. The left leg is looking much better 09/02/2015 -- she is going to have a debridement and punch biopsies of her right lower extremity by Dr. Leotis Pain this coming Thursday. Also seen Dr. Ola Spurr who has continued her on ciprofloxacin. 09/09/2015 -- on 09/04/2015 Dr. Leotis Pain took her to surgery - Irrigation and excisional debridement of skin, soft tissue, and muscle to about 40 cm2 to the right posterior calf with biopsy. Pathology results are -- DIAGNOSIS: SKIN, RIGHT LOWER EXTREMITY; BIOPSY: SKIN AND SOFT TISSUE WITH ULCERATION, SEE NOTE. - NEGATIVE FOR DYSPLASIA AND MALIGNANCY. Note: There is acute inflammation and ulceration of the epidermis. The dermis shows nonspecific inflammation, neovascularization, and hemosiderin deposition. The differential diagnosis for these findings includes stasis  dermatitis, nonspecific dermatitis, and infection. Correlation with clinical findings is required. A PAS fungal stain is obtained and results will be reported in an addendum. cultures were also sent and this grew Pseudomonas aeruginosa, Escherichia coli, Proteus mirabilis, Klebsiella pneumoniae and they were all sensitive to ciprofloxacin which she is on. 09/16/2015 -- he saw Dr. 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 He is going to call the patient in and discuss with her the management possibly with Imuran. I will continue treating her locally. 09/23/2015 -- she saw Dr. Ola Spurr today who was going to continue the antibiotics for now and stop after this course. She has an appointment to see Dr. Jefm Bryant in about a week's time. Her wound VAC has arrived but she did not bring it with her today. We will try and set her up for changes to the right leg 3 times a week. She is here for her first application  of Apligraf to the left lower extremity. MACKYNZI, HAFLER ([id number]) 09/30/2015 -- she has seen Dr. 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 because she is ambulatory at home health will not apply a wound VAC and she will have to come here 3 times a week on Monday Wednesday and Friday. 10/10/2015 -- she is here for a second application of Apligraf to her left lower extremity. 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 Jefm Bryant. Other than that she is doing well 10/31/2015 -- she is here for a third application of Apligraf to her left lower extremity. after reviewing her wound on the right side I noted that it is granulating extremely well and we will use the remnants of the Apligraf on the right leg. 11/14/2015 --  she was recently admitted to the hospital between January 11 and 11/10/2015 for uncontrolled hypertension, diabetes mellitus, acute kidney injury. during her admission she was supported by wound care and also by antibiotics. Around this time her immunosuppression was stopped by Dr. Nunzio Cory. She is feeling much better now. 11/21/2015 -- she has had her fourth application of Apligraf today and it has been shared between the left and the right leg 11/24/2015 -- was here for a wound VAC change but it was noticed that she had profuse bleeding from the lobe medial part of her right lower extremity and I was called in to evaluate her. 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 She has been doing fine since then 12/19/2015 -- she has finished 5 applications of Apligraf to the left lower extremity and this is doing great. He began with pain and swelling of her right ankle a couple of days ago and it has caused her much distress. She does not have any fever or any change in her general health. 12/22/2015 -- the patient's pain and swelling of the right lower extremity has resolved over the weekend. The left lower extremity is completely healed. She has had the first application of Apligraf on the right leg today. 01/02/2016 -- She is here for the second application of Apligraf on the right leg today. 01/19/2016 -- she is here for the third application of Apligraf to the right posterior lateral leg. 02/02/2016 -- today she has had her fourth application of Apligraf and we have applied some of it to the right posterior lateral leg and the remaining waist we have used for the left anterior medial leg. Objective Constitutional Pulse regular. Respirations normal and unlabored. Afebrile. Vitals Time Taken: 1:00 PM, Height: 65 in, Weight: 248 lbs, BMI: 41.3, Temperature: 97.7 F, Pulse: 67 bpm, Respiratory Rate: 18  breaths/min, Blood Pressure: 144/52 mmHg. Eyes Nonicteric. Reactive to light. Gervase, Bea D. ([id number]) Ears, Nose, Mouth, and Throat Lips, teeth, and gums WNL.Marland Kitchen Moist mucosa without lesions. Neck supple and nontender. No palpable supraclavicular or cervical adenopathy. Normal sized without goiter. Respiratory WNL. No retractions.. Cardiovascular Pedal Pulses WNL. No clubbing, cyanosis or edema. Lymphatic No adneopathy. No adenopathy. No adenopathy. Musculoskeletal Adexa without tenderness or enlargement.. Digits and nails w/o clubbing, cyanosis, infection, petechiae, ischemia, or inflammatory conditions.Marland Kitchen Psychiatric Judgement and insight Intact.. No evidence of depression, anxiety, or agitation.. General Notes: the right lower extremity posterior lateral wound has some hyper granulation tissue and this has been cauterized with silver nitrate  stick and then for the open areas have applied out of the Apligraf. The rest of the Apligraf was applied to the left anterior medial wound and both wounds were dressed with appropriate bolsters and Steri-Strips. Integumentary (Hair, Skin) No suspicious lesions. No crepitus or fluctuance. No peri-wound warmth or erythema. No masses.. Wound #5 status is Open. Original cause of wound was Gradually Appeared. The wound is located on the Right,Posterior Lower Leg. The wound measures 4cm length x 4cm width x 0.1cm depth; 12.566cm^2 area and 1.257cm^3 volume. The wound is limited to skin breakdown. There is no tunneling or undermining noted. There is a medium amount of serosanguineous drainage noted. The wound margin is indistinct and nonvisible. There is small (1-33%) pink, pale granulation within the wound bed. There is a medium (34- 66%) amount of necrotic tissue within the wound bed including Adherent Slough. The periwound skin appearance exhibited: Localized Edema, Moist. The periwound skin appearance did not exhibit: Callus, Crepitus,  Excoriation, Fluctuance, Friable, Induration, Rash, Scarring, Dry/Scaly, Maceration, Atrophie Blanche, Cyanosis, Ecchymosis, Hemosiderin Staining, Mottled, Pallor, Rubor, Erythema. Periwound temperature was noted as No Abnormality. Wound #6 status is Open. Original cause of wound was Gradually Appeared. The wound is located on the Right,Medial Lower Leg. The wound measures 6.2cm length x 3.8cm width x 0.1cm depth; 18.504cm^2 area and 1.85cm^3 volume. The wound is limited to skin breakdown. There is no tunneling or undermining noted. There is a small amount of serous drainage noted. The wound margin is flat and intact. There is medium (34-66%) red granulation within the wound bed. There is a medium (34-66%) amount of necrotic tissue within the wound bed including Eschar and Adherent Slough. The periwound skin appearance exhibited: Moist. The periwound skin appearance did not exhibit: Callus, Crepitus, Excoriation, Fluctuance, Friable, Induration, Localized Edema, Rash, Scarring, Dry/Scaly, Maceration, Atrophie Blanche, Cyanosis, Morton, Dezyre D. ([id number]) Ecchymosis, Hemosiderin Staining, Mottled, Pallor, Rubor, Erythema. Periwound temperature was noted as No Abnormality. The periwound has tenderness on palpation. Assessment Active Problems ICD-10 E11.622 - Type 2 diabetes mellitus with other skin ulcer E66.01 - Morbid (severe) obesity due to excess calories I89.0 - Lymphedema, not elsewhere classified L97.212 - Non-pressure chronic ulcer of right calf with fat layer exposed I87.311 - Chronic venous hypertension (idiopathic) with ulcer of right lower extremity With the usual precautions the fourth Apligraf was applied today and bolstered in place. Part of the wound has hyper-granulated but the anterior wound is beginning to look very clean and the rest of the Apligraf was applied on this wound. She will come for a wound check next week. Procedures Wound #5 Wound #5 is a Venous Leg Ulcer  located on the Right,Posterior Lower Leg. A skin graft procedure using a bioengineered skin substitute/cellular or tissue based product was performed by [name], MD. Apligraf was applied and secured with Steri-Strips. 22 sq cm of product was utilized and 0 sq cm was wasted. Post Application, mepitel 1 was applied. A Time Out was conducted prior to the start of the procedure. The procedure was tolerated well with a pain level of 0 throughout and a pain level of 0 following the procedure. Post procedure Diagnosis Wound #5: Same as Pre-Procedure . Wound #6 Wound #6 is a Diabetic Wound/Ulcer of the Lower Extremity located on the Right,Medial Lower Leg. A skin graft procedure using a bioengineered skin substitute/cellular or tissue based product was performed by [name], MD. Apligraf was applied and secured with Steri-Strips. 22 sq cm of product was utilized and 0 sq cm  was wasted. Post Application, mepitel 1 was applied. A Time Out was conducted prior to the start of the procedure. The procedure was tolerated well with a pain level of 0 throughout and a pain level of 0 following the procedure. Post procedure Diagnosis Wound #6: Same as Pre-Procedure . ADALIDA, DIEGO ([id number]) Plan Wound Cleansing: Wound #5 Right,Posterior Lower Leg: Cleanse wound with mild soap and water Wound #6 Right,Medial Lower Leg: Cleanse wound with mild soap and water Anesthetic: Wound #5 Right,Posterior Lower Leg: Topical Lidocaine 4% cream applied to wound bed prior to debridement Wound #6 Right,Medial Lower Leg: Topical Lidocaine 4% cream applied to wound bed prior to debridement Skin Barriers/Peri-Wound Care: Wound #5 Right,Posterior Lower Leg: Skin Prep Wound #6 Right,Medial Lower Leg: Skin Prep Secondary Dressing: Wound #5 Right,Posterior Lower Leg: Gauze and Kerlix/Conform Wound #6 Right,Medial Lower Leg: Gauze and Kerlix/Conform Dressing Change Frequency: Wound #5 Right,Posterior Lower  Leg: Change dressing every week Wound #6 Right,Medial Lower Leg: Change dressing every week Follow-up Appointments: Wound #5 Right,Posterior Lower Leg: Return Appointment in 1 week. Wound #6 Right,Medial Lower Leg: Return Appointment in 1 week. Edema Control: Wound #5 Right,Posterior Lower Leg: Elevate legs to the level of the heart and pump ankles as often as possible Other: - Lightly wrapped with coban. Wear compression hose Wound #6 Right,Medial Lower Leg: Elevate legs to the level of the heart and pump ankles as often as possible Other: - Lightly wrapped with coban. Wear compression hose Advanced Therapies: Wound #5 Right,Posterior Lower Leg: Apligraf application in clinic; including contact layer, fixation with steri strips, dry gauze and cover dressing. - Patient instructed to leave dressing in place till next visit. Not to get it wet. Wound #6 Right,Medial Lower Leg: Apligraf application in clinic; including contact layer, fixation with steri strips, dry gauze and cover dressing. - Patient instructed to leave dressing in place till next visit. Not to get it wet. NORMIE, BIRKS ([id number]) With the usual precautions the fourth Apligraf was applied today and bolstered in place. Part of the wound has hyper-granulated but the anterior wound is beginning to look very clean and the rest of the Apligraf was applied on this wound. She will come for a wound check next week. Electronic Signature(s) Signed: 02/02/2016 3:27:17 PM By: [name], [name] Previous Signature: 02/02/2016 3:26:53 PM Version By: [name], [name] Previous Signature: 02/02/2016 1:40:49 PM Version By: [name], [name] Entered By: [name] on 02/02/2016 15:27:17 Delsanto, Karma D. ([id number]) -------------------------------------------------------------------------------- SuperBill Details Patient Name: SHIAN, MAZZOTTA D. Date of Service: 02/02/2016 Medical Record Patient Account Number:  [id number] Number: [name], RN, [name], Treating RN: 26-Aug-1946 (70 y.o. [name] Date of Birth/Sex: Female) Other Clinician: Primary Care Physician: [facility], Delana Meyer Treating [name] Referring Physician: Glendon Axe Physician/Extender: Weeks in Treatment: 52 Diagnosis Coding ICD-10 Codes Code Description E11.622 Type 2 diabetes mellitus with other skin ulcer E66.01 Morbid (severe) obesity due to excess calories I89.0 Lymphedema, not elsewhere classified L97.212 Non-pressure chronic ulcer of right calf with fat layer exposed I87.311 Chronic venous hypertension (idiopathic) with ulcer of right lower extremity Facility Procedures CPT4 Code Description: [id number] (Facility Use Only) Apligraf 1 SQ CM Modifier: Quantity: 46 CPT4 Code Description: [id number] - SKIN SUB GRAFT TRNK/ARM/LEG ICD-10 Description Diagnosis E11.622 Type 2 diabetes mellitus with other skin ulcer I87.311 Chronic venous hypertension (idiopathic) with ulcer o I89.0 Lymphedema, not elsewhere  classified L97.212 Non-pressure chronic ulcer of right calf with fat lay Modifier: f right lower er exposed Quantity: 1  extremity CPT4 Code Description: [id number] - SKIN SUB GRAFT T/A/L ADD-ON ICD-10 Description Diagnosis E11.622 Type 2 diabetes mellitus with other skin ulcer L97.212 Non-pressure chronic ulcer of right calf with fat lay I89.0 Lymphedema, not elsewhere  classified I87.311 Chronic venous hypertension (idiopathic) with ulcer o Modifier: er exposed f right lower Quantity: 1 extremity Physician Procedures CPT4 Code Description: [id number] - WC PHYS SKIN SUB GRAFT TRNK/ARM/LEG Description ALONAH, DLUGOS ([id number]) Modifier: Quantity: 1 Electronic Signature(s) Signed: 02/02/2016 1:41:19 PM By: [name], [name] Entered By: [name] on 02/02/2016 13:41:19

## 2016-02-09 ENCOUNTER — Encounter (HOSPITAL_BASED_OUTPATIENT_CLINIC_OR_DEPARTMENT_OTHER): Payer: Commercial Managed Care - HMO | Admitting: General Surgery

## 2016-02-09 DIAGNOSIS — E11622 Type 2 diabetes mellitus with other skin ulcer: Secondary | ICD-10-CM | POA: Diagnosis not present

## 2016-02-09 DIAGNOSIS — I83012 Varicose veins of right lower extremity with ulcer of calf: Secondary | ICD-10-CM

## 2016-02-09 DIAGNOSIS — L97219 Non-pressure chronic ulcer of right calf with unspecified severity: Principal | ICD-10-CM

## 2016-02-09 DIAGNOSIS — L97229 Non-pressure chronic ulcer of left calf with unspecified severity: Secondary | ICD-10-CM

## 2016-02-09 DIAGNOSIS — I87313 Chronic venous hypertension (idiopathic) with ulcer of bilateral lower extremity: Secondary | ICD-10-CM

## 2016-02-09 DIAGNOSIS — L97929 Non-pressure chronic ulcer of unspecified part of left lower leg with unspecified severity: Secondary | ICD-10-CM

## 2016-02-09 DIAGNOSIS — I83022 Varicose veins of left lower extremity with ulcer of calf: Secondary | ICD-10-CM | POA: Diagnosis not present

## 2016-02-09 DIAGNOSIS — L97919 Non-pressure chronic ulcer of unspecified part of right lower leg with unspecified severity: Secondary | ICD-10-CM

## 2016-02-09 NOTE — Progress Notes (Signed)
See I heal 

## 2016-02-10 NOTE — Progress Notes (Signed)
AYSE, SPROUSE ([id number]) Visit Report for 02/09/2016 Chief Complaint Document Details Patient Name: [name], [name] 02/09/2016 1:30 Date of Service: PM Medical Record [id number] Number: Patient Account Number: [id number] [birth year] (70 y.o. Treating RN: [name] Date of Birth/Sex: Female) Other Clinician: Primary Care Physician: [facility], Hyman Hopes, Kanisha Duba Referring Physician: Glendon Axe Physician/Extender: Weeks in Treatment: 32 Information Obtained from: Patient Chief Complaint Patient presents to the wound care center for a consult due non healing wound 70 year old patient comes with a history of having a ulcer on the left lower extremity for the past 4 weeks. she says she's had swelling of both lower extremities for about a year after she started having prednisone. 02/07/2015 -- her vascular appointments obtained were in the first and third week of June. she is able to go to [place] and we will try and get her some earlier appointments. Other than that nothing else has changed in her management. Electronic Signature(s) Signed: 02/09/2016 2:31:45 PM By: Judene Companion MD Entered By: Judene Companion on 02/09/2016 14:31:45 Sheard, [name] ([id number]) -------------------------------------------------------------------------------- HPI Details Patient Name: [name], [name] D. 02/09/2016 1:30 Date of Service: PM Medical Record [id number] Number: Patient Account Number: [id number] [birth year] (70 y.o. Treating RN: [name] Date of Birth/Sex: Female) Other Clinician: Primary Care Physician: Danny Lawless, Kadince Boxley Referring Physician: Glendon Axe Physician/Extender: Weeks in Treatment: 24 History of Present Illness HPI Description: 70 year old patient who is known to have diabetes mellitus type 2, chronic renal insufficiency, coronary artery disease, hypertension, hypercholesterolemia, temporal arteritis and inflammatory arthritiss also  has a history of having a hysterectomy and some orthopedic related surgeries. The ulcer on the left lower extremity started off as a blister and then. Got progressively worse. She does not have any fever or chills and has not had any recent surgical intervention for this. Her last hemoglobin A1c was 10.1 in September 2015. She has been recently put on doxycycline by her PCP. She is now also allergic to doxycycline and was this was changed over to Keflex. 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 She does see a cardiologist and also takes a diuretic. 02/07/2015 her arterial and venous duplex studies to be done have dates been given as the first and third week of June. This is at [facility]. We are going to try and get early appointments at [facility]. other than that nothing has changed in her management. 02/14/2015 -- we have been able to get her an appointment in [facility] on May 20 which is much earlier than her previous ones at [place]. She continues with her prednisone and her sugars are in the range of 150-200. 02/21/2015 We were able to get a vascular lab workup for her today and she is going to be there at 2:00 this afternoon. the swelling of her leg has gone down significantly but she still has some tenderness over the wounds. 02/28/2015 - She has had one of two vascular workups done, and this coming Tuesday has another, at [facility]. She continues to be on steroid medications. She has significant sensitivity in her left lower extremity and has pain suggestive of neuropathic pain and I have asked her to address this with her primary care physician. 03/07/2015 -- The patient saw Dr. 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  Official report is awaited. Addendum: Official reports are now available and on 03/04/2015. She was seen and lower extremity venous duplex exam was done. There was reflux present within the left greater saphenous vein below the knee and also the left small saphenous vein. Arterial duplex showed normal triphasic waveforms throughout the left lower extremity without any significant stenosis. Her ABIs were noncompressible bilaterally but a waveforms were normal and a digital pressures were normal bilaterally consistent with no significant arterial insufficiency. He has recommended endovenous ablation of both the left small saphenous and the left great saphenous vein. This would still be scheduled later. 03/14/2015 -- she has heard back from the vascular office and has surgery scheduled for sometime in July. [name], [name] ([id number]) Her rheumatologist has decreased her prednisone dosage but she still on it. She has also had cataract surgery in her right eye recently this week. 03/20/2015 - No new complaints today. Pain improved. No fever or chills. Tolerating 2 layer compression. 04/14/2015 -- she was doing very well today she went off on vacation and now her edema has increased markedly the ulceration is bigger and her diabetes is not under control. 04/21/2015 -- I spoke to her PCP Dr. Candiss Norse and discussed the management which would include being seen by a general surgeon for debridement and taking multiple punch biopsies which would help in establishing the diagnosis of this is a vasculitis. She is agreeable about this and will set her up for the procedure with Dr. Tamala Julian at [facility]. She was seen by the surgeon Dr. Jamal Collin. 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 She is scheduled to have laser ablation done next week.Pt was sent here for possible biopsy to look for  vasculitis. Feel it would be better to wait after laser ablation is completed- the ulcer may heal fully and biopsy may not be necessary 04/29/2015 -- she had the venous ablation done by Dr. Lucky Cowboy last Friday and we do not have any notes yet. She is doing fine otherwise. 05/06/2015 --Review of her recent vascular intervention shows that she was seen by Dr. Lucky Cowboy on 04/29/2015. The follow-up duplex which was done showed that both the great saphenous vein and the small saphenous vein remained patent with reflux consistent with an unsuccessful ablation. He has rescheduled her for another the endovenous ablation to be done in about 4 weekso time. 05/13/2015 -- he was seen by her surgeon Dr. Jamal Collin who asked her to continue with conservative therapy and he would speak to Dr. Lucky Cowboy about her management. Dr. Lucky Cowboy is going to schedule her surgery in the middle of August for a repeat endovenous ablation. Her pus culture from last week has grown : Storey her noted her sensitivity report but due to her multiple allergies I had tried clindamycin and she developed a rash with this too. She has been prescribed and anti-buttocks in the ER and is has it at home and she will let is know what she is going to be taking. 05/20/2015 -- she has developed a small spot on her right lower extremity but besides that it is not a full fledged ulceration. She did not get to see Dr. Lucky Cowboy last week and hopefully she will see him in the near future. 05/27/2015 -she is still awaiting her appointment  with Dr.Dew and her vascular procedure is not scheduled until August 19. She will be seeing her PCP tomorrow and I have asked her to convey our discussion so that she is aware that debridement has not been done yet. 06/03/2015 -- was seen by her rheumatologist Dr. Dorthula Matas, who has been treating her for temporal arteritis and  in his note has mentioned the possibility of vasculitis or pyoderma gangrenosum. He is lowering her prednisone to 12-1/2 mg for 1 month and then 10 mg per the next month. I will again make an attempt to speak to her PCP Dr. Candiss Norse and her surgeon Dr. 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 06/17/2015 -- Dr.Dew did her vascular procedure last week and a follow-up venous ultrasound shows good resolution of the veins as per the patient's history. He is to see her back in 2 weeks. [name], [name] ([id number]) 07/07/2015. -- the patient has had a heavy growth of Proteus mirabilis and Enterococcus faecalis. These are sensitive to several drugs but the problem is she has allergies to all of these and hence I would like her to see Dr. Ola Spurr for this. She is also due to see Dr. Lucky Cowboy tomorrow and I will discussed the management with him including debridement under anesthesia and possible biopsies. 07/14/2015 -- she has an appointment to see Dr. Ola Spurr tomorrow and did see Dr. Bunnie Domino PA who will discuss my request with him. 07/21/2015 -- saw Dr. Ola Spurr was able to do a test on her and has put her on amoxicillin. She has been tolerating that and has had no problems with allergies to this. 07/28/2015 -- Last Friday I spoke to Dr. Leotis Pain regarding her care and he said that her right leg did not need any surgery and on the left leg was doing pretty good. We did agree that if she undergoes any procedure in the future he would do a couple of punch biopsies of the wound. 08/18/2015 -- her right leg is very tender and there is significant amount of slough. The left leg is looking much better 09/02/2015 -- she is going to have a debridement and punch biopsies of her right lower extremity by Dr. Leotis Pain this coming Thursday. Also seen Dr. Ola Spurr who has continued her on  ciprofloxacin. 09/09/2015 -- on 09/04/2015 Dr. Leotis Pain took her to surgery - Irrigation and excisional debridement of skin, soft tissue, and muscle to about 40 cm2 to the right posterior calf with biopsy. Pathology results are -- DIAGNOSIS: SKIN, RIGHT LOWER EXTREMITY; BIOPSY: SKIN AND SOFT TISSUE WITH ULCERATION, SEE NOTE. - NEGATIVE FOR DYSPLASIA AND MALIGNANCY. Note: There is acute inflammation and ulceration of the epidermis. The dermis shows nonspecific inflammation, neovascularization, and hemosiderin deposition. The differential diagnosis for these findings includes stasis dermatitis, nonspecific dermatitis, and infection. Correlation with clinical findings is required. A PAS fungal stain is obtained and results will be reported in an addendum. cultures were also sent and this grew Pseudomonas aeruginosa, Escherichia coli, Proteus mirabilis, Klebsiella pneumoniae and they were all sensitive to ciprofloxacin which she is on. 09/16/2015 -- he saw Dr. 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 He is going to call the patient in and discuss with her the management possibly with Imuran. I will continue treating her  locally. 09/23/2015 -- she saw Dr. Ola Spurr today who was going to continue the antibiotics for now and stop after this course. She has an appointment to see Dr. Jefm Bryant in about a week's time. Her wound VAC has arrived but she did not bring it with her today. We will try and set her up for changes to the right leg 3 times a week. She is here for her first application of Apligraf to the left lower extremity. 09/30/2015 -- she has seen Dr. 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 because she is ambulatory at home health will not apply a wound VAC and she will have  to come here 3 times a week on Monday Wednesday and Friday. 10/10/2015 -- she is here for a second application of Apligraf to her left lower extremity. 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 Jefm Bryant. Other than that she is doing well 10/31/2015 -- she is here for a third application of Apligraf to her left lower extremity. after reviewing her wound on the right side I noted that it is granulating extremely well and we will use the remnants of the Apligraf on the right leg. [name], [name] ([id number]) 11/14/2015 -- she was recently admitted to the hospital between January 11 and 11/10/2015 for uncontrolled hypertension, diabetes mellitus, acute kidney injury. during her admission she was supported by wound care and also by antibiotics. Around this time her immunosuppression was stopped by Dr. Nunzio Cory. She is feeling much better now. 11/21/2015 -- she has had her fourth application of Apligraf today and it has been shared between the left and the right leg 11/24/2015 -- was here for a wound VAC change but it was noticed that she had profuse bleeding from the lobe medial part of her right lower extremity and I was called in to evaluate her. 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 She has been doing fine since then 12/19/2015 -- she has finished 5 applications of Apligraf to the left lower extremity and this is doing great. He began with pain and swelling of her right ankle a couple of days ago and it has caused her much distress. She does not have any fever or any change in her general health. 12/22/2015 -- the patient's pain and swelling of the right lower extremity has resolved over the weekend. The left lower extremity is completely healed. She has had the first application of Apligraf on the right leg today. 01/02/2016 -- She is  here for the second application of Apligraf on the right leg today. 01/19/2016 -- she is here for the third application of Apligraf to the right posterior lateral leg. 02/02/2016 -- today she has had her fourth application of Apligraf and we have applied some of it to the right posterior lateral leg and the remaining waist we have used for the left anterior medial leg. Electronic Signature(s) Signed: 02/09/2016 2:32:35 PM By: Judene Companion MD Entered By: Judene Companion on 02/09/2016 14:32:35 [name], [name] ([id number]) -------------------------------------------------------------------------------- Physical Exam Details Patient Name: [name], [name] 02/09/2016 1:30 Date of Service: PM Medical Record [id number] Number: Patient Account Number: [id number] 1946-06-21 (71 y.o. Treating RN: [name] Date of Birth/Sex: Female) Other Clinician: Primary Care Physician: [facility], Hyman Hopes, Treyton Slimp Referring Physician: Glendon Axe Physician/Extender: Weeks in  Treatment: 53 Electronic Signature(s) Signed: 02/09/2016 2:32:41 PM By: Judene Companion MD Entered By: Judene Companion on 02/09/2016 14:32:41 [name], [name] ([id number]) -------------------------------------------------------------------------------- Physician Orders Details Patient Name: [name], [name] 02/09/2016 1:30 Date of Service: PM Medical Record [id number] Number: Patient Account Number: [id number] 24-Apr-1946 (70 y.o. Treating RN: [name] Date of Birth/Sex: Female) Other Clinician: Primary Care Physician: Danny Lawless, Denaly Gatling Referring Physician: Glendon Axe Physician/Extender: Weeks in Treatment: 4 Verbal / Phone Orders: Yes Clinician: [name] Read Back and Verified: Yes Diagnosis Coding Wound Cleansing Wound #5 Right,Posterior Lower Leg o Cleanse wound with mild soap and water Wound #6 Right,Medial Lower Leg o Cleanse wound with mild soap and  water Anesthetic Wound #5 Right,Posterior Lower Leg o Topical Lidocaine 4% cream applied to wound bed prior to debridement Wound #6 Right,Medial Lower Leg o Topical Lidocaine 4% cream applied to wound bed prior to debridement Skin Barriers/Peri-Wound Care Wound #5 Right,Posterior Lower Leg o Skin Prep Wound #6 Right,Medial Lower Leg o Skin Prep Primary Wound Dressing Wound #5 Right,Posterior Lower Leg o Dry Gauze o Boardered Foam Dressing o Drawtex Wound #6 Right,Medial Lower Leg o Dry Gauze o Boardered Foam Dressing o Drawtex Dressing Change Frequency Wound #5 Right,Posterior Lower Leg Monty, Chrysa D. ([id number]) o Change dressing every week Wound #6 Right,Medial Lower Leg o Change dressing every week Follow-up Appointments Wound #5 Right,Posterior Lower Leg o Return Appointment in 1 week. Wound #6 Right,Medial Lower Leg o Return Appointment in 1 week. Edema Control Wound #5 Right,Posterior Lower Leg o Elevate legs to the level of the heart and pump ankles as often as possible o Other: - wear compression hose Wound #6 Right,Medial Lower Leg o Elevate legs to the level of the heart and pump ankles as often as possible o Other: - wear compression hose Electronic Signature(s) Signed: 02/09/2016 3:38:49 PM By: Judene Companion MD Signed: 02/09/2016 5:04:52 PM By: [name] Entered By: [name] on 02/09/2016 14:00:29 Burger, [name] ([id number]) -------------------------------------------------------------------------------- Problem List Details Patient Name: ANEKA, DANTONA D. 02/09/2016 1:30 Date of Service: PM Medical Record [id number] Number: Patient Account Number: [id number] August 09, 1946 (70 y.o. Treating RN: [name] Date of Birth/Sex: Female) Other Clinician: Primary Care Physician: Danny Lawless, Alben Jepsen Referring Physician: Glendon Axe Physician/Extender: Weeks in Treatment: 60 Active  Problems ICD-10 Encounter Code Description Active Date Diagnosis E11.622 Type 2 diabetes mellitus with other skin ulcer 01/31/2015 Yes E66.01 Morbid (severe) obesity due to excess calories 01/31/2015 Yes I89.0 Lymphedema, not elsewhere classified 01/31/2015 Yes L97.212 Non-pressure chronic ulcer of right calf with fat layer 11/24/2015 Yes exposed I87.311 Chronic venous hypertension (idiopathic) with ulcer of 11/24/2015 Yes right lower extremity Inactive Problems Resolved Problems ICD-10 Code Description Active Date Resolved Date L97.322 Non-pressure chronic ulcer of left ankle with fat layer 01/31/2015 01/31/2015 exposed I83.222 Varicose veins of left lower extremity with both ulcer of 03/07/2015 03/07/2015 calf and inflammation I83.223 03/07/2015 03/07/2015 Range, Kassadi D. ([id number]) 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 L03.116 Cellulitis of left lower limb 07/07/2015 07/07/2015 Electronic Signature(s) Signed: 02/09/2016 2:31:26 PM By: Judene Companion MD Entered By: Judene Companion on 02/09/2016 14:31:25 Decelle, Tia Masker ([id number]) -------------------------------------------------------------------------------- Progress Note Details Patient Name: [name] 02/09/2016 1:30 Date of Service: PM Medical Record [id number] Number: Patient Account Number: [id number] December 08, 1945 (70 y.o. Treating RN: [name] Date of Birth/Sex: Female) Other Clinician: Primary Care Physician: [facility], [place] Referring Physician: Glendon Axe Physician/Extender: Weeks in Treatment: 53 Subjective  Chief Complaint Information obtained from Patient Patient presents to the wound care center for a consult due non healing wound 70 year old patient comes with a history of having a ulcer on the left lower extremity for the past 4 weeks. she says she's had swelling of both lower extremities for about a year after she started having prednisone.  02/07/2015 -- her vascular appointments obtained were in the first and third week of June. she is able to go to [facility] and we will try and get her some earlier appointments. Other than that nothing else has changed in her management. History of Present Illness (HPI) 70 year old patient who is known to have diabetes mellitus type 2, chronic renal insufficiency, coronary artery disease, hypertension, hypercholesterolemia, temporal arteritis and inflammatory arthritiss also has a history of having a hysterectomy and some orthopedic related surgeries. The ulcer on the left lower extremity started off as a blister and then. Got progressively worse. She does not have any fever or chills and has not had any recent surgical intervention for this. Her last hemoglobin A1c was 10.1 in September 2015. She has been recently put on doxycycline by her PCP. She is now also allergic to doxycycline and was this was changed over to Keflex. 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 She does see a cardiologist and also takes a diuretic. 02/07/2015 her arterial and venous duplex studies to be done have dates been given as the first and third week of June. This is at [facility]. We are going to try and get early appointments at [facility]. other than that nothing has changed in her management. 02/14/2015 -- we have been able to get her an appointment in [facility] on May 20 which is much earlier than her previous ones at [place]. She continues with her prednisone and her sugars are in the range of 150-200. 02/21/2015 We were able to get a vascular lab workup for her today and she is going to be there at 2:00 this afternoon. the swelling of her leg has gone down significantly but she still has some tenderness over the wounds. 02/28/2015 - She has had one of two vascular workups done, and this coming  Tuesday has another, at [facility]. She continues to be on steroid medications. She has significant sensitivity in her left lower extremity and has pain suggestive of neuropathic pain and I have asked her to address this with her primary care physician. 03/07/2015 -- The patient saw Dr. Lucky Cowboy for a consultation and he has had her arteries are okay but she has 2 incompetent veins on the left lower extremity and he is going to set her up for surgery. Official report is TEQUIA, PELHAM ([id number]) awaited. Addendum: Official reports are now available and on 03/04/2015. She was seen and lower extremity venous duplex exam was done. There was reflux present within the left greater saphenous vein below the knee and also the left small saphenous vein. Arterial duplex showed normal triphasic waveforms throughout the left lower extremity without any significant stenosis. Her ABIs were noncompressible bilaterally but a waveforms were normal and a digital pressures were normal bilaterally consistent with no significant arterial insufficiency. He has recommended endovenous ablation of both the left small saphenous and the left great saphenous vein. This would still be scheduled later. 03/14/2015 -- she has heard back from the vascular office and has surgery  scheduled for sometime in July. Her rheumatologist has decreased her prednisone dosage but she still on it. She has also had cataract surgery in her right eye recently this week. 03/20/2015 - No new complaints today. Pain improved. No fever or chills. Tolerating 2 layer compression. 04/14/2015 -- she was doing very well today she went off on vacation and now her edema has increased markedly the ulceration is bigger and her diabetes is not under control. 04/21/2015 -- I spoke to her PCP Dr. Candiss Norse and discussed the management which would include being seen by a general surgeon for debridement and taking multiple punch biopsies which  would help in establishing the diagnosis of this is a vasculitis. She is agreeable about this and will set her up for the procedure with Dr. Tamala Julian at [facility]. She was seen by the surgeon Dr. Jamal Collin. 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 She is scheduled to have laser ablation done next week.Pt was sent here for possible biopsy to look for vasculitis. Feel it would be better to wait after laser ablation is completed- the ulcer may heal fully and biopsy may not be necessary 04/29/2015 -- she had the venous ablation done by Dr. Lucky Cowboy last Friday and we do not have any notes yet. She is doing fine otherwise. 05/06/2015 --Review of her recent vascular intervention shows that she was seen by Dr. Lucky Cowboy on 04/29/2015. The follow-up duplex which was done showed that both the great saphenous vein and the small saphenous vein remained patent with reflux consistent with an unsuccessful ablation. He has rescheduled her for another the endovenous ablation to be done in about 4 weeks time. 05/13/2015 -- he was seen by her surgeon Dr. Jamal Collin who asked her to continue with conservative therapy and he would speak to Dr. Lucky Cowboy about her management. Dr. Lucky Cowboy is going to schedule her surgery in the middle of August for a repeat endovenous ablation. Her pus culture from last week has grown : Emmet her noted her sensitivity report but due to her multiple allergies I had tried clindamycin and she developed a rash with this too. She has been prescribed and anti-buttocks in the ER and is has it at home and she will let is know what she is going to be taking. 05/20/2015 -- she has developed a small spot on her right lower extremity but besides that it is not a full fledged ulceration. She did not get  to see Dr. Lucky Cowboy last week and hopefully she will see him in the near future. 05/27/2015 -she is still awaiting her appointment with Dr.Dew and her vascular procedure is not scheduled until August 19. She will be seeing her PCP tomorrow and I have asked her to convey our discussion so that she is aware that debridement has not been done yet. KELLEN, WALKINSHAW ([id number]) 06/03/2015 -- was seen by her rheumatologist Dr. 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 He is lowering her prednisone to 12-1/2 mg for 1 month and then 10 mg per the next month. I will again make an attempt to speak to her PCP Dr. Candiss Norse and her surgeon Dr. Lucky Cowboy to see if he can organize for a debridement in the OR with multiple biopsies to establish a diagnosis of vasculitis  or pyoderma gangrenosum. 06/17/2015 -- Dr.Dew did her vascular procedure last week and a follow-up venous ultrasound shows good resolution of the veins as per the patient's history. He is to see her back in 2 weeks. 07/07/2015. -- the patient has had a heavy growth of Proteus mirabilis and Enterococcus faecalis. These are sensitive to several drugs but the problem is she has allergies to all of these and hence I would like her to see Dr. Ola Spurr for this. She is also due to see Dr. Lucky Cowboy tomorrow and I will discussed the management with him including debridement under anesthesia and possible biopsies. 07/14/2015 -- she has an appointment to see Dr. Ola Spurr tomorrow and did see Dr. Bunnie Domino PA who will discuss my request with him. 07/21/2015 -- saw Dr. Ola Spurr was able to do a test on her and has put her on amoxicillin. She has been tolerating that and has had no problems with allergies to this. 07/28/2015 -- Last Friday I spoke to Dr. Leotis Pain regarding her care and he said that her right leg did not need any surgery and on the left leg  was doing pretty good. We did agree that if she undergoes any procedure in the future he would do a couple of punch biopsies of the wound. 08/18/2015 -- her right leg is very tender and there is significant amount of slough. The left leg is looking much better 09/02/2015 -- she is going to have a debridement and punch biopsies of her right lower extremity by Dr. Leotis Pain this coming Thursday. Also seen Dr. Ola Spurr who has continued her on ciprofloxacin. 09/09/2015 -- on 09/04/2015 Dr. Leotis Pain took her to surgery - Irrigation and excisional debridement of skin, soft tissue, and muscle to about 40 cm2 to the right posterior calf with biopsy. Pathology results are -- DIAGNOSIS: SKIN, RIGHT LOWER EXTREMITY; BIOPSY: SKIN AND SOFT TISSUE WITH ULCERATION, SEE NOTE. - NEGATIVE FOR DYSPLASIA AND MALIGNANCY. Note: There is acute inflammation and ulceration of the epidermis. The dermis shows nonspecific inflammation, neovascularization, and hemosiderin deposition. The differential diagnosis for these findings includes stasis dermatitis, nonspecific dermatitis, and infection. Correlation with clinical findings is required. A PAS fungal stain is obtained and results will be reported in an addendum. cultures were also sent and this grew Pseudomonas aeruginosa, Escherichia coli, Proteus mirabilis, Klebsiella pneumoniae and they were all sensitive to ciprofloxacin which she is on. 09/16/2015 -- he saw Dr. 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 He is going to call the patient in and discuss with her the management possibly with Imuran. I will continue treating her locally. 09/23/2015 -- she saw Dr. Ola Spurr today who was going to continue the antibiotics for now and stop after this course. She has an appointment to see Dr. Jefm Bryant in about a week's time. Her wound VAC has  arrived but she did not bring it with her today. We will try and set her up for changes to the right leg 3 times a week. She is here for her first application of Apligraf to the left lower extremity. SHANTIL, WOOTAN ([id number]) 09/30/2015 -- she has seen Dr. 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 because she is ambulatory at home health will not apply a wound VAC and she will have to come here 3 times  a week on Monday Wednesday and Friday. 10/10/2015 -- she is here for a second application of Apligraf to her left lower extremity. 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 Jefm Bryant. Other than that she is doing well 10/31/2015 -- she is here for a third application of Apligraf to her left lower extremity. after reviewing her wound on the right side I noted that it is granulating extremely well and we will use the remnants of the Apligraf on the right leg. 11/14/2015 -- she was recently admitted to the hospital between January 11 and 11/10/2015 for uncontrolled hypertension, diabetes mellitus, acute kidney injury. during her admission she was supported by wound care and also by antibiotics. Around this time her immunosuppression was stopped by Dr. Nunzio Cory. She is feeling much better now. 11/21/2015 -- she has had her fourth application of Apligraf today and it has been shared between the left and the right leg 11/24/2015 -- was here for a wound VAC change but it was noticed that she had profuse bleeding from the lobe medial part of her right lower extremity and I was called in to evaluate her. 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 She has been doing fine since then 12/19/2015 -- she has finished 5 applications of Apligraf to the left lower  extremity and this is doing great. He began with pain and swelling of her right ankle a couple of days ago and it has caused her much distress. She does not have any fever or any change in her general health. 12/22/2015 -- the patient's pain and swelling of the right lower extremity has resolved over the weekend. The left lower extremity is completely healed. She has had the first application of Apligraf on the right leg today. 01/02/2016 -- She is here for the second application of Apligraf on the right leg today. 01/19/2016 -- she is here for the third application of Apligraf to the right posterior lateral leg. 02/02/2016 -- today she has had her fourth application of Apligraf and we have applied some of it to the right posterior lateral leg and the remaining waist we have used for the left anterior medial leg. Objective Constitutional Vitals Time Taken: 1:50 PM, Height: 65 in, Weight: 248 lbs, BMI: 41.3, Temperature: 97.8 F, Pulse: 76 bpm, Respiratory Rate: 18 breaths/min, Blood Pressure: 167/66 mmHg. Integumentary (Hair, Skin) Wound #5 status is Open. Original cause of wound was Gradually Appeared. The wound is located on the Right,Posterior Lower Leg. The wound measures 4cm length x 4cm width x 0.1cm depth; 12.566cm^2 area and 1.257cm^3 volume. The wound is limited to skin breakdown. There is no tunneling or undermining Mattioli, Laurene D. ([id number]) noted. There is a medium amount of serosanguineous drainage noted. The wound margin is indistinct and nonvisible. There is small (1-33%) pink, pale granulation within the wound bed. There is a medium (34- 66%) amount of necrotic tissue within the wound bed including Adherent Slough. The periwound skin appearance exhibited: Localized Edema, Moist. The periwound skin appearance did not exhibit: Callus, Crepitus, Excoriation, Fluctuance, Friable, Induration, Rash, Scarring, Dry/Scaly, Maceration, Atrophie Blanche, Cyanosis, Ecchymosis,  Hemosiderin Staining, Mottled, Pallor, Rubor, Erythema. Periwound temperature was noted as No Abnormality. Wound #6 status is Open. Original cause of wound was Gradually Appeared. The wound is located on the Right,Medial Lower Leg. The wound measures 6.2cm length x 3.8cm width x 0.1cm depth; 18.504cm^2  area and 1.85cm^3 volume. The wound is limited to skin breakdown. There is no tunneling or undermining noted. There is a small amount of serous drainage noted. The wound margin is flat and intact. There is medium (34-66%) red granulation within the wound bed. There is a medium (34-66%) amount of necrotic tissue within the wound bed including Eschar and Adherent Slough. The periwound skin appearance exhibited: Moist. The periwound skin appearance did not exhibit: Callus, Crepitus, Excoriation, Fluctuance, Friable, Induration, Localized Edema, Rash, Scarring, Dry/Scaly, Maceration, Atrophie Blanche, Cyanosis, Ecchymosis, Hemosiderin Staining, Mottled, Pallor, Rubor, Erythema. Periwound temperature was noted as No Abnormality. The periwound has tenderness on palpation. 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 ([id number]) Skin Barriers/Peri-Wound Care: Wound #5 Right,Posterior Lower  Leg: Skin Prep Wound #6 Right,Medial Lower Leg: Skin Prep Primary Wound Dressing: Wound #5 Right,Posterior Lower Leg: Dry Gauze Boardered Foam Dressing Drawtex Wound #6 Right,Medial Lower Leg: Dry Gauze Boardered Foam Dressing Drawtex Dressing Change Frequency: Wound #5 Right,Posterior Lower Leg: Change dressing every week Wound #6 Right,Medial Lower Leg: Change dressing every week Follow-up Appointments: Wound #5 Right,Posterior Lower Leg: Return Appointment in 1 week. Wound #6 Right,Medial Lower Leg: Return Appointment in 1 week. Edema Control: Wound #5 Right,Posterior Lower Leg: Elevate legs to the level of the heart and pump ankles as often as possible Other: - wear compression hose Wound #6 Right,Medial Lower Leg: Elevate legs to the level of the heart and pump ankles as often as possible Other: - wear compression hose Follow-Up Appointments: A Patient Clinical Summary of Care was provided to LM For Apligraf check right leg . Doing well RTC 1 week Electronic Signature(s) Signed: 02/09/2016 2:33:43 PM By: Judene Companion MD Entered By: Judene Companion on 02/09/2016 14:33:43 Eaker, DANAH BERN ([id number]) Lepera, Calina D. ([id number]) -------------------------------------------------------------------------------- SuperBill Details Patient Name: [name] Date of Service: 02/09/2016 Medical Record Number: [id number] Patient Account Number: [id number] Date of Birth/Sex: 1946-04-17 (70 y.o. Female) Treating RN: [name] Primary Care Physician: Glendon Axe Other Clinician: Referring Physician: Glendon Axe Treating Physician/Extender: Benjaman Pott in Treatment: 53 Diagnosis Coding ICD-10 Codes Code Description E11.622 Type 2 diabetes mellitus with other skin ulcer E66.01 Morbid (severe) obesity due to excess calories I89.0 Lymphedema, not elsewhere classified L97.212 Non-pressure chronic ulcer of right calf with fat layer exposed I87.311 Chronic  venous hypertension (idiopathic) with ulcer of right lower extremity Physician Procedures CPT4 Code: [id number] Description: [id number] - WC PHYS LEVEL 1 EST PT ICD-10 Description Diagnosis L97.212 Non-pressure chronic ulcer of right calf with fat Modifier: layer exposed Quantity: 1 Electronic Signature(s) Signed: 02/09/2016 2:34:14 PM By: Judene Companion MD Entered By: Judene Companion on 02/09/2016 14:34:13

## 2016-02-10 NOTE — Progress Notes (Signed)
ELLEA, BISEL ([id number]) Visit Report for 02/09/2016 Arrival Information Details Patient Name: [name], [name] Date of Service: 02/09/2016 1:30 PM Medical Record Number: [id number] Patient Account Number: [id number] Date of Birth/Sex: 1945/11/21 (70 y.o. Female) Treating RN: [name] Primary Care Physician: Glendon Axe Other Clinician: Referring Physician: Glendon Axe Treating Physician/Extender: Benjaman Pott in Treatment: 22 Visit Information History Since Last Visit Added or deleted any medications: No Patient Arrived: Cane Any new allergies or adverse reactions: No Arrival Time: 13:49 Had a fall or experienced change in No Accompanied By: self activities of daily living that may affect Transfer Assistance: None risk of falls: Patient Identification Verified: Yes Signs or symptoms of abuse/neglect since last No Secondary Verification Process Yes visito Completed: Hospitalized since last visit: No Patient Requires Transmission- No Pain Present Now: No Based Precautions: Patient Has Alerts: Yes Patient Alerts: Patient on Blood Thinner Electronic Signature(s) Signed: 02/09/2016 5:04:52 PM By: [name] Entered By: [name] on 02/09/2016 13:50:14 [name], [name] ([id number]) -------------------------------------------------------------------------------- Encounter Discharge Information Details Patient Name: Kristin Stare D. Date of Service: 02/09/2016 1:30 PM Medical Record Number: [id number] Patient Account Number: [id number] Date of Birth/Sex: Jul 24, 1946 (70 y.o. Female) Treating RN: [name] Primary Care Physician: Glendon Axe Other Clinician: Referring Physician: Glendon Axe Treating Physician/Extender: Benjaman Pott in Treatment: 23 Encounter Discharge Information Items Discharge Pain Level: 0 Discharge Condition: Stable Ambulatory Status: Ambulatory Discharge Destination: Home Transportation: Private  Auto Accompanied By: self Schedule Follow-up Appointment: Yes Medication Reconciliation completed and provided to Patient/Care No Wes Lezotte: Provided on Clinical Summary of Care: 02/09/2016 Form Type Recipient Paper Patient LM Electronic Signature(s) Signed: 02/09/2016 3:57:26 PM By: [name] Previous Signature: 02/09/2016 2:34:48 PM Version By: Judene Companion MD Previous Signature: 02/09/2016 2:17:33 PM Version By: Ruthine Dose Entered By: [name] on 02/09/2016 15:57:25 [name], [name] ([id number]) -------------------------------------------------------------------------------- Multi Wound Chart Details Patient Name: Kristin Stare D. Date of Service: 02/09/2016 1:30 PM Medical Record Number: [id number] Patient Account Number: [id number] Date of Birth/Sex: 03/16/1946 (70 y.o. 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] [N/A:N/A] Comorbid History: [5:Cataracts, Asthma, Coronary Artery Disease, Coronary Artery Disease, Hypertension, Type II Diabetes, Osteoarthritis, Diabetes, Osteoarthritis, Neuropathy] [6:Cataracts, Asthma, Hypertension, Type II Neuropathy] [N/A:N/A] Date Acquired: [5:04/29/2015] [6:10/10/2015] [N/A:N/A] Weeks of Treatment: [5:37] [6:17] [N/A:N/A] Wound Status: [5:Open] [6:Open] [N/A:N/A] Measurements L x W x D 4x4x0.1 [6:6.2x3.8x0.1]  [N/A:N/A] (cm) Area (cm) : [5:12.566] [6:18.504] [N/A:N/A] Volume (cm) : [5:1.257] [6:1.85] [N/A:N/A] % Reduction in Area: [5:-6584.00%] [6:-435.40%] [N/A:N/A] % Reduction in Volume: -6515.80% [6:-434.70%] [N/A:N/A] Classification: [5:Full Thickness Without Exposed Support Structures] [6:Grade 1] [N/A:N/A] HBO Classification: [5:Grade 2] [6:N/A] [N/A:N/A] Exudate Amount: [5:Medium] [6:Small] [N/A:N/A] Exudate Type: [5:Serosanguineous] [6:Serous] [N/A:N/A] Exudate Color: [5:red, brown] [6:amber] [N/A:N/A] Wound Margin: [5:Indistinct, nonvisible] [6:Flat and Intact] [N/A:N/A] Granulation Amount: [5:Small (1-33%)] [6:Medium (34-66%)] [N/A:N/A] Granulation Quality: [5:Pink, Pale] [6:Red] [N/A:N/A] Necrotic Amount: Medium (34-66%) Medium (34-66%) N/A Necrotic Tissue: Adherent Slough Eschar, Adherent Slough N/A Exposed Structures: Fascia: No Fascia: No N/A Fat: No Fat: No Tendon: No Tendon: No Muscle: No Muscle: No Joint: No Joint: No Bone: No  Bone: No Limited to Skin Limited to Skin Breakdown Breakdown Epithelialization: None None N/A Periwound Skin Texture: Edema: Yes Edema: No N/A Excoriation: No Excoriation: No Induration: No Induration: No Callus: No Callus: No Crepitus: No Crepitus: No Fluctuance: No Fluctuance: No Friable: No Friable: No Rash: No Rash: No Scarring: No Scarring: No Periwound Skin Moist: Yes Moist: Yes N/A Moisture: Maceration: No Maceration: No Dry/Scaly: No Dry/Scaly: No Periwound Skin Color: Atrophie Blanche: No Atrophie Blanche: No N/A Cyanosis: No Cyanosis: No Ecchymosis: No Ecchymosis: No Erythema: No Erythema: No Hemosiderin Staining: No Hemosiderin Staining: No Mottled: No Mottled: No Pallor: No Pallor: No Rubor: No Rubor: No Temperature: No Abnormality No Abnormality N/A Tenderness on No Yes N/A Palpation: Wound Preparation: Ulcer Cleansing: Ulcer Cleansing: N/A Rinsed/Irrigated with Rinsed/Irrigated  with Saline Saline Topical Anesthetic Topical Anesthetic Applied: None Applied: None Treatment Notes Electronic Signature(s) Signed: 02/09/2016 5:04:52 PM By: [name] Entered By: [name] on 02/09/2016 13:59:33 [name], [name] Kitchen ([id number]) -------------------------------------------------------------------------------- Multi-Disciplinary Care Plan Details Patient Name: HAELYNN, BECKLUND D. Date of Service: 02/09/2016 1:30 PM Medical Record Number: [id number] Patient Account Number: [id number] Date of Birth/Sex: Jan 25, 1946 (70 y.o. Female) Treating RN: [name] Primary Care Physician: Glendon Axe Other Clinician: Referring Physician: Glendon Axe Treating Physician/Extender: Benjaman Pott in Treatment: 91 Active Inactive Orientation to the Wound Care Program Nursing Diagnoses: Knowledge deficit related to the wound healing center program Goals: Patient/caregiver will verbalize understanding of the [place] Program Date Initiated: 01/31/2015 Goal Status: Active Interventions: Provide education on orientation to the wound center Notes: Venous Leg Ulcer Nursing Diagnoses: Potential for venous Insuffiency (use before diagnosis confirmed) Goals: Non-invasive venous studies are completed as ordered Date Initiated: 01/31/2015 Goal Status: Active Patient/caregiver will verbalize understanding of disease process and disease management Date Initiated: 01/31/2015 Goal Status: Active Interventions: Assess peripheral edema status every visit. Notes: Wound/Skin Impairment Nursing Diagnoses: Impaired tissue integrity Knowledge deficit related to smoking impact on wound healing [name], [name] ([id number]) Goals: Patient/caregiver will verbalize understanding of skin care regimen Date Initiated: 01/31/2015 Goal Status: Active Ulcer/skin breakdown will heal within 14 weeks Date Initiated: 01/31/2015 Goal Status: Active Interventions: Assess ulceration(s)  every visit Notes: Electronic Signature(s) Signed: 02/09/2016 5:04:52 PM By: [name] Entered By: [name] on 02/09/2016 13:59:24 [name], [name] ([id number]) -------------------------------------------------------------------------------- Patient/Caregiver Education Details Patient Name: Kristin Stare D. Date of Service: 02/09/2016 1:30 PM Medical Record Number: [id number] Patient Account Number: [id number] Date of Birth/Gender: August 25, 1946 (70 y.o. Female) Treating RN: [name] Primary Care Physician: Glendon Axe Other Clinician: Referring Physician: Glendon Axe Treating Physician/Extender: Benjaman Pott in Treatment: 97 Education Assessment Education Provided To: Patient Education Topics Provided Wound/Skin Impairment: Handouts: Other: wound care if needed Methods: Demonstration, Explain/Verbal Responses: State content correctly Electronic Signature(s) Signed: 02/09/2016 3:57:47 PM By: [name] Previous Signature: 02/09/2016 2:34:57 PM Version By: Judene Companion MD Entered By: [name] on 02/09/2016 15:57:46 [name], [name] ([id number]) -------------------------------------------------------------------------------- Wound Assessment Details Patient Name: Kristin Stare D. Date of Service: 02/09/2016 1:30 PM Medical Record Number: [id number] Patient Account Number: [id number] Date of Birth/Sex: [birth year] (70 y.o. Female) Treating RN: [name] Primary Care Physician: Glendon Axe Other Clinician: Referring Physician: Glendon Axe Treating Physician/Extender: Benjaman Pott in Treatment: 53 Wound Status Wound Number: 5 Primary Venous Leg Ulcer Etiology: Wound Location: Right Lower Leg - Posterior Wound Open Wounding Event: Gradually Appeared Status: Date Acquired: 04/29/2015 Comorbid Cataracts, Asthma, Coronary Artery Weeks Of Treatment: 37 History: Disease, Hypertension, Type II Clustered Wound: No Diabetes,  Osteoarthritis, Neuropathy  Photos Photo Uploaded By: [name] on 02/09/2016 16:28:33 Wound Measurements Length: (cm) 4 Width: (cm) 4 Depth: (cm) 0.1 Area: (cm) 12.566 Volume: (cm) 1.257 % Reduction in Area: -6584% % Reduction in Volume: -6515.8% Epithelialization: None Tunneling: No Undermining: No Wound Description Full Thickness Without Exposed Foul Odor A Classification: Support Structures Diabetic Severity Grade 2 (Wagner): Wound Margin: Indistinct, nonvisible Exudate Amount: Medium Exudate Type: Serosanguineous Exudate Color: red, brown fter Cleansing: No Wound Bed Granulation Amount: Small (1-33%) Exposed Structure [place], Sherika D. ([id number]) Granulation Quality: Pink, Pale Fascia Exposed: No Necrotic Amount: Medium (34-66%) Fat Layer Exposed: No Necrotic Quality: Adherent Slough Tendon Exposed: No Muscle Exposed: No Joint Exposed: No Bone Exposed: No Limited to Skin Breakdown Periwound Skin Texture Texture Color No Abnormalities Noted: No No Abnormalities Noted: No Callus: No Atrophie Blanche: No Crepitus: No Cyanosis: No Excoriation: No Ecchymosis: No Fluctuance: No Erythema: No Friable: No Hemosiderin Staining: No Induration: No Mottled: No Localized Edema: Yes Pallor: No Rash: No Rubor: No Scarring: No Temperature / Pain Moisture Temperature: No Abnormality No Abnormalities Noted: No Dry / Scaly: No Maceration: No Moist: Yes Wound Preparation Ulcer Cleansing: Rinsed/Irrigated with Saline Topical Anesthetic Applied: None Treatment Notes Wound #5 (Right, Posterior Lower Leg) 1. Cleansed with: Clean wound with Normal Saline 3. Peri-wound Care: Skin Prep 4. Dressing Applied: Other dressing (specify in notes) 5. Secondary Dressing Applied Bordered Foam Dressing Notes drawtex Electronic Signature(s) Signed: 02/09/2016 5:04:52 PM By: [name] Entered By: [name] on 02/09/2016 13:58:51 [name], [name]  ([id number]) [name], [name] ([id number]) -------------------------------------------------------------------------------- Wound Assessment Details Patient Name: [name], [name] Date of Service: 02/09/2016 1:30 PM Medical Record Number: [id number] Patient Account Number: [id number] Date of Birth/Sex: [birth year] (70 y.o. Female) Treating RN: [name] Primary Care Physician: Glendon Axe Other Clinician: Referring Physician: Glendon Axe Treating Physician/Extender: Benjaman Pott in Treatment: 81 Wound Status Wound Number: 6 Primary Diabetic Wound/Ulcer of the Lower Etiology: Extremity Wound Location: Right Lower Leg - Medial Wound Open Wounding Event: Gradually Appeared Status: Date Acquired: 10/10/2015 Comorbid Cataracts, Asthma, Coronary Artery Weeks Of Treatment: 17 History: Disease, Hypertension, Type II Clustered Wound: No Diabetes, Osteoarthritis, Neuropathy Photos Photo Uploaded By: [name] on 02/09/2016 16:28:34 Wound Measurements Length: (cm) 6.2 % Reduction in Width: (cm) 3.8 % Reduction in Depth: (cm) 0.1 Epithelializati Area: (cm) 18.504 Tunneling: Volume: (cm) 1.85 Undermining: Area: -435.4% Volume: -434.7% on: None No No Wound Description Classification: Grade 1 Foul Odor After Wound Margin: Flat and Intact Exudate Amount: Small Exudate Type: Serous Exudate Color: amber Cleansing: No Wound Bed Granulation Amount: Medium (34-66%) Exposed Structure Granulation Quality: Red Fascia Exposed: No Necrotic Amount: Medium (34-66%) Fat Layer Exposed: No Necrotic Quality: Eschar, Adherent Slough Tendon Exposed: No [name], Kyasia D. ([id number]) Muscle Exposed: No Joint Exposed: No Bone Exposed: No Limited to Skin Breakdown Periwound Skin Texture Texture Color No Abnormalities Noted: No No Abnormalities Noted: No Callus: No Atrophie Blanche: No Crepitus: No Cyanosis: No Excoriation: No Ecchymosis: No Fluctuance:  No Erythema: No Friable: No Hemosiderin Staining: No Induration: No Mottled: No Localized Edema: No Pallor: No Rash: No Rubor: No Scarring: No Temperature / Pain Moisture Temperature: No Abnormality No Abnormalities Noted: No Tenderness on Palpation: Yes Dry / Scaly: No Maceration: No Moist: Yes Wound Preparation Ulcer Cleansing: Rinsed/Irrigated with Saline Topical Anesthetic Applied: None Treatment Notes Wound #6 (Right, Medial Lower Leg) 1. Cleansed with: Clean wound with Normal Saline 3. Peri-wound Care: Skin Prep 4. Dressing Applied: Other dressing (specify in notes) 5. Secondary Dressing  Applied Bordered Foam Dressing Notes drawtex Electronic Signature(s) Signed: 02/09/2016 5:04:52 PM By: [name] Entered By: [name] on 02/09/2016 13:59:09 [place], Val D. ([id number]) -------------------------------------------------------------------------------- Vitals Details Patient Name: Kristin Stare D. Date of Service: 02/09/2016 1:30 PM Medical Record Number: [id number] Patient Account Number: [id number] Date of Birth/Sex: [birth year] (70 y.o. Female) Treating RN: [name] Primary Care Physician: Glendon Axe Other Clinician: Referring Physician: Glendon Axe Treating Physician/Extender: Benjaman Pott in Treatment: 53 Vital Signs Time Taken: 13:50 Temperature (F): 97.8 Height (in): 65 Pulse (bpm): 76 Weight (lbs): 248 Respiratory Rate (breaths/min): 18 Body Mass Index (BMI): 41.3 Blood Pressure (mmHg): 167/66 Reference Range: 80 - 120 mg / dl Electronic Signature(s) Signed: 02/09/2016 5:04:52 PM By: [name] Entered By: [name] on 02/09/2016 13:51:19

## 2016-02-16 ENCOUNTER — Encounter: Payer: Commercial Managed Care - HMO | Admitting: Surgery

## 2016-02-16 DIAGNOSIS — E11622 Type 2 diabetes mellitus with other skin ulcer: Secondary | ICD-10-CM | POA: Diagnosis not present

## 2016-02-17 ENCOUNTER — Encounter: Payer: Self-pay | Admitting: *Deleted

## 2016-02-17 ENCOUNTER — Emergency Department
Admission: EM | Admit: 2016-02-17 | Discharge: 2016-02-17 | Disposition: A | Discharge status: Home / Self Care | Payer: Commercial Managed Care - HMO | Attending: Emergency Medicine | Admitting: Emergency Medicine

## 2016-02-17 ENCOUNTER — Other Ambulatory Visit: Payer: Self-pay

## 2016-02-17 DIAGNOSIS — E1165 Type 2 diabetes mellitus with hyperglycemia: Secondary | ICD-10-CM | POA: Insufficient documentation

## 2016-02-17 DIAGNOSIS — Z7982 Long term (current) use of aspirin: Secondary | ICD-10-CM | POA: Insufficient documentation

## 2016-02-17 DIAGNOSIS — Z79899 Other long term (current) drug therapy: Secondary | ICD-10-CM | POA: Diagnosis not present

## 2016-02-17 DIAGNOSIS — I129 Hypertensive chronic kidney disease with stage 1 through stage 4 chronic kidney disease, or unspecified chronic kidney disease: Secondary | ICD-10-CM | POA: Insufficient documentation

## 2016-02-17 DIAGNOSIS — Z794 Long term (current) use of insulin: Secondary | ICD-10-CM | POA: Insufficient documentation

## 2016-02-17 DIAGNOSIS — N189 Chronic kidney disease, unspecified: Secondary | ICD-10-CM | POA: Insufficient documentation

## 2016-02-17 DIAGNOSIS — Z87891 Personal history of nicotine dependence: Secondary | ICD-10-CM | POA: Diagnosis not present

## 2016-02-17 DIAGNOSIS — I10 Essential (primary) hypertension: Secondary | ICD-10-CM

## 2016-02-17 DIAGNOSIS — E11622 Type 2 diabetes mellitus with other skin ulcer: Secondary | ICD-10-CM | POA: Diagnosis not present

## 2016-02-17 DIAGNOSIS — F419 Anxiety disorder, unspecified: Secondary | ICD-10-CM

## 2016-02-17 DIAGNOSIS — E785 Hyperlipidemia, unspecified: Secondary | ICD-10-CM | POA: Diagnosis not present

## 2016-02-17 DIAGNOSIS — R739 Hyperglycemia, unspecified: Secondary | ICD-10-CM

## 2016-02-17 LAB — COMPREHENSIVE METABOLIC PANEL
ALBUMIN: 3.5 g/dL (ref 3.5–5.0)
ALK PHOS: 57 U/L (ref 38–126)
ALT: 12 U/L — ABNORMAL LOW (ref 14–54)
ANION GAP: 9 (ref 5–15)
AST: 19 U/L (ref 15–41)
BUN: 28 mg/dL — ABNORMAL HIGH (ref 6–20)
CALCIUM: 9.2 mg/dL (ref 8.9–10.3)
CO2: 25 mmol/L (ref 22–32)
Chloride: 102 mmol/L (ref 101–111)
Creatinine, Ser: 1.76 mg/dL — ABNORMAL HIGH (ref 0.44–1.00)
GFR calc non Af Amer: 28 mL/min — ABNORMAL LOW (ref >60)
GFR, EST AFRICAN AMERICAN: 33 mL/min — AB (ref >60)
Glucose, Bld: 445 mg/dL — ABNORMAL HIGH (ref 65–99)
POTASSIUM: 4 mmol/L (ref 3.5–5.1)
SODIUM: 136 mmol/L (ref 135–145)
Total Bilirubin: 0.4 mg/dL (ref 0.3–1.2)
Total Protein: 7.2 g/dL (ref 6.5–8.1)

## 2016-02-17 LAB — CBC WITH DIFFERENTIAL/PLATELET
BASOS PCT: 1 %
Basophils Absolute: 0.1 10*3/uL (ref 0–0.1)
EOS ABS: 0.1 10*3/uL (ref 0–0.7)
EOS PCT: 2 %
HCT: 31.3 % — ABNORMAL LOW (ref 35.0–47.0)
HEMOGLOBIN: 10.3 g/dL — AB (ref 12.0–16.0)
LYMPHS ABS: 1 10*3/uL (ref 1.0–3.6)
Lymphocytes Relative: 14 %
MCH: 29.2 pg (ref 26.0–34.0)
MCHC: 33 g/dL (ref 32.0–36.0)
MCV: 88.3 fL (ref 80.0–100.0)
MONO ABS: 0.6 10*3/uL (ref 0.2–0.9)
MONOS PCT: 9 %
Neutro Abs: 5.3 10*3/uL (ref 1.4–6.5)
Neutrophils Relative %: 74 %
PLATELETS: 242 10*3/uL (ref 150–440)
RBC: 3.55 MIL/uL — ABNORMAL LOW (ref 3.80–5.20)
RDW: 15.5 % — AB (ref 11.5–14.5)
WBC: 7.2 10*3/uL (ref 3.6–11.0)

## 2016-02-17 LAB — TROPONIN I: Troponin I: <0.03 ng/mL (ref <0.031)

## 2016-02-17 MED ORDER — LORAZEPAM 0.5 MG PO TABS
0.5000 mg | ORAL_TABLET | Freq: Once | ORAL | Status: AC
  Administered 2016-02-17: 0.5 mg via ORAL
  Filled 2016-02-17: qty 1

## 2016-02-17 MED ORDER — INSULIN ASPART 100 UNIT/ML ~~LOC~~ SOLN
5.0000 [IU] | Freq: Once | SUBCUTANEOUS | Status: AC
  Administered 2016-02-17: 5 [IU] via INTRAVENOUS
  Filled 2016-02-17: qty 5

## 2016-02-17 MED ORDER — LORAZEPAM 0.5 MG PO TABS: 0.5000 mg | ORAL_TABLET | Freq: Three times a day (TID) | ORAL | Status: DC | PRN

## 2016-02-17 NOTE — ED Notes (Signed)
Pt reports increased stress due to husbands death 1 month ago. Pt reports this past week has been hard on her. She reports she does not feel like she is having anxiety attacks but has noticed increased sadness throughout this week.

## 2016-02-17 NOTE — Discharge Instructions (Signed)
Hyperglycemia High blood sugar (hyperglycemia) means that the level of sugar in your blood is higher than it should be. Signs of high blood sugar include:  Feeling thirsty.  Frequent peeing (urinating).  Feeling tired or sleepy.  Dry mouth.  Vision changes.  Feeling weak.  Feeling hungry but losing weight.  Numbness and tingling in your hands or feet.  Headache. When you ignore these signs, your blood sugar may keep going up. These problems may get worse, and other problems may begin. HOME CARE  Check your blood sugars as told by your doctor. Write down the numbers with the date and time.  Take the right amount of insulin or diabetes pills at the right time. Write down the dose with date and time.  Refill your insulin or diabetes pills before running out.  Watch what you eat. Follow your meal plan.  Drink liquids without sugar, such as water. Check with your doctor if you have kidney or heart disease.  Follow your doctor's orders for exercise. Exercise at the same time of day.  Keep your doctor's appointments. GET HELP RIGHT AWAY IF:   You have trouble thinking or are confused.  You have fast breathing with fruity smelling breath.  You pass out (faint).  You have 2 to 3 days of high blood sugars and you do not know why.  You have chest pain.  You are feeling sick to your stomach (nauseous) or throwing up (vomiting).  You have sudden vision changes. MAKE SURE YOU:   Understand these instructions.  Will watch your condition.  Will get help right away if you are not doing well or get worse.   This information is not intended to replace advice given to you by your health care provider. Make sure you discuss any questions you have with your health care provider.   Document Released: 08/08/2009 Document Revised: 11/01/2014 Document Reviewed: 06/17/2015 Elsevier Interactive Patient Education 2016 [employer].  Hypertension Hypertension is another name for  high blood pressure. High blood pressure forces your heart to work harder to pump blood. A blood pressure reading has two numbers, which includes a higher number over a lower number (example: 110/72). HOME CARE   Have your blood pressure rechecked by your doctor.  Only take medicine as told by your doctor. Follow the directions carefully. The medicine does not work as well if you skip doses. Skipping doses also puts you at risk for problems.  Do not smoke.  Monitor your blood pressure at home as told by your doctor. GET HELP IF:  You think you are having a reaction to the medicine you are taking.  You have repeat headaches or feel dizzy.  You have puffiness (swelling) in your ankles.  You have trouble with your vision. GET HELP RIGHT AWAY IF:   You get a very bad headache and are confused.  You feel weak, numb, or faint.  You get chest or belly (abdominal) pain.  You throw up (vomit).  You cannot breathe very well. MAKE SURE YOU:   Understand these instructions.  Will watch your condition.  Will get help right away if you are not doing well or get worse.   This information is not intended to replace advice given to you by your health care provider. Make sure you discuss any questions you have with your health care provider.   Document Released: 03/29/2008 Document Revised: 10/16/2013 Document Reviewed: 08/03/2013 Elsevier Interactive Patient Education 2016 Elsevier Inc. Panic Attacks Panic attacks are sudden, short-livedsurges  of severe anxiety, fear, or discomfort. They may occur for no reason when you are relaxed, when you are anxious, or when you are sleeping. Panic attacks may occur for a number of reasons:   Healthy people occasionally have panic attacks in extreme, life-threatening situations, such as war or natural disasters. Normal anxiety is a protective mechanism of the body that helps [place] react to danger (fight or flight response).  Panic attacks are often  seen with anxiety disorders, such as panic disorder, social anxiety disorder, generalized anxiety disorder, and phobias. Anxiety disorders cause excessive or uncontrollable anxiety. They may interfere with your relationships or other life activities.  Panic attacks are sometimes seen with other mental illnesses, such as depression and posttraumatic stress disorder.  Certain medical conditions, prescription medicines, and drugs of abuse can cause panic attacks. SYMPTOMS  Panic attacks start suddenly, peak within 20 minutes, and are accompanied by four or more of the following symptoms:  Pounding heart or fast heart rate (palpitations).  Sweating.  Trembling or shaking.  Shortness of breath or feeling smothered.  Feeling choked.  Chest pain or discomfort.  Nausea or strange feeling in your stomach.  Dizziness, light-headedness, or feeling like you will faint.  Chills or hot flushes.  Numbness or tingling in your lips or hands and feet.  Feeling that things are not real or feeling that you are not yourself.  Fear of losing control or going crazy.  Fear of dying. Some of these symptoms can mimic serious medical conditions. For example, you may think you are having a heart attack. Although panic attacks can be very scary, they are not life threatening. DIAGNOSIS  Panic attacks are diagnosed through an assessment by your health care provider. Your health care provider will ask questions about your symptoms, such as where and when they occurred. Your health care provider will also ask about your medical history and use of alcohol and drugs, including prescription medicines. Your health care provider may order blood tests or other studies to rule out a serious medical condition. Your health care provider may refer you to a mental health professional for further evaluation. TREATMENT   Most healthy people who have one or two panic attacks in an extreme, life-threatening situation will  not require treatment.  The treatment for panic attacks associated with anxiety disorders or other mental illness typically involves counseling with a mental health professional, medicine, or a combination of both. Your health care provider will help determine what treatment is best for you.  Panic attacks due to physical illness usually go away with treatment of the illness. If prescription medicine is causing panic attacks, talk with your health care provider about stopping the medicine, decreasing the dose, or substituting another medicine.  Panic attacks due to alcohol or drug abuse go away with abstinence. Some adults need professional help in order to stop drinking or using drugs. HOME CARE INSTRUCTIONS   Take all medicines as directed by your health care provider.   Schedule and attend follow-up visits as directed by your health care provider. It is important to keep all your appointments. SEEK MEDICAL CARE IF:  You are not able to take your medicines as prescribed.  Your symptoms do not improve or get worse. SEEK IMMEDIATE MEDICAL CARE IF:   You experience panic attack symptoms that are different than your usual symptoms.  You have serious thoughts about hurting yourself or others.  You are taking medicine for panic attacks and have a serious side effect. MAKE  SURE YOU:  Understand these instructions.  Will watch your condition.  Will get help right away if you are not doing well or get worse.   This information is not intended to replace advice given to you by your health care provider. Make sure you discuss any questions you have with your health care provider.   Document Released: 10/11/2005 Document Revised: 10/16/2013 Document Reviewed: 05/25/2013 Elsevier Interactive Patient Education [employer].

## 2016-02-17 NOTE — ED Provider Notes (Signed)
[facility] Emergency Department Provider Note     Time seen: ----------------------------------------- 9:12 PM on 02/17/2016 -----------------------------------------    I have reviewed the triage vital signs and the nursing notes.   HISTORY  Chief Complaint Hypertension and Anxiety    HPI [name] is a 70 y.o. female who presents ER for elevated blood pressure at home. Patient then rechecked her blood pressure and is markedly elevated. She called EMS for further evaluation. EMS reports patient was anxious upon arrival due to the high blood pressure. Patient states she's taken her medicines as prescribed but notes that her husband died about a month ago. She denies any recent illness or complaints at this time.   Past Medical History  Diagnosis Date  . Diabetes mellitus without complication ([place])     Type II  . Ulcer of left lower leg [facility]) February 07, 2015  . Chronic renal insufficiency February 07, 2015  . Coronary artery dilation ([place])   . Hypertension   . Hyperlipidemia   . Arthritis     Inflammatory  . Thyroid disease   . Sinus problem     Patient Active Problem List   Diagnosis Date Noted  . Demand ischemia ([place]) 11/07/2015  . Uncontrolled hypertension 11/05/2015  . Uncontrolled diabetes mellitus ([place]) 11/05/2015  . 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 Varicose veins of left lower extremity with ulcer of calf ([place]) 08/04/2015    Past Surgical History  Procedure Laterality Date  . Abdominal hysterectomy    . Dilation and curettage of uterus  1984  . Partial hysterectomy  1985  . Rotator cuff repair  2004  . Cyst excision    . Cataract extraction w/phaco Right 03/11/2015    Procedure: CATARACT EXTRACTION PHACO AND INTRAOCULAR LENS PLACEMENT (IOC);  Surgeon: Birder Robson, MD;  Location: [facility]  ORS;  Service: Ophthalmology;  Laterality: Right;  [place] 00:40 AP% 24.3 CDE 9.86  . I&d extremity Right 09/04/2015    Procedure: IRRIGATION AND DEBRIDEMENT EXTREMITY/ AND BIOPSY;  Surgeon: Algernon Huxley, MD;  Location: [facility] ORS;  Service: Vascular;  Laterality: Right;    Allergies Doxycycline; Levaquin; Clindamycin/lincomycin; Penicillins; and Sulfa antibiotics  Social History Social History  Substance Use Topics  . Smoking status: Former Smoker -- 32 years  . Smokeless tobacco: Never Used  . Alcohol Use: No    Review of Systems Constitutional: Negative for fever. Eyes: Negative for visual changes. ENT: Negative for sore throat. Cardiovascular: Negative for chest pain. Respiratory: Negative for shortness of breath. Gastrointestinal: Negative for abdominal pain, vomiting and diarrhea. Genitourinary: Negative for dysuria. Musculoskeletal: Negative for back pain. Skin: Negative for rash. Neurological: Negative for headaches, focal weakness or numbness.  10-point ROS otherwise negative.  ____________________________________________   PHYSICAL EXAM:  VITAL SIGNS: ED Triage Vitals  Enc Vitals Group     BP --      Pulse --      Resp --      Temp --      Temp src --      SpO2 --      Weight 02/17/16 2111 219 lb (99.338 kg)     Height 02/17/16 2111 5\' 5"  (1.651 m)     Head Cir --      Peak Flow --      Pain Score --      Pain Loc --  Pain Edu? --      Excl. in [place]? --     Constitutional: Alert and oriented. Well appearing and in no distress. Eyes: Conjunctivae are normal. PERRL. Normal extraocular movements. ENT   Head: Normocephalic and atraumatic.   Nose: No congestion/rhinnorhea.   Mouth/Throat: Mucous membranes are moist.   Neck: No stridor. Cardiovascular: Normal rate, regular rhythm. No murmurs, rubs, or gallops. Respiratory: Normal respiratory effort without tachypnea nor retractions. Breath sounds are clear and equal bilaterally. No  wheezes/rales/rhonchi. Gastrointestinal: Soft and nontender. Normal bowel sounds Musculoskeletal: Nontender with normal range of motion in all extremities. No lower extremity tenderness nor edema. Neurologic:  Normal speech and language. No gross focal neurologic deficits are appreciated.  Skin:  Skin is warm, dry and intact. No rash noted. Psychiatric: Mood and affect are normal. Speech and behavior are normal.  ____________________________________________  EKG: Interpreted by me.Sinus rhythm with a rate of 83 bpm, normal PR interval, normal QRS, normal QT interval. 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  ____________________________________________  ED COURSE:  Pertinent labs & imaging results that were available during my care of the patient were reviewed by me and considered in my medical decision making (see chart for details). Patient is in no acute distress, will check basic labs and reevaluate. She essentially has a symptomatically hypertension likely from stress or anxiety ____________________________________________    LABS (pertinent positives/negatives)  Labs Reviewed  CBC WITH DIFFERENTIAL/PLATELET - Abnormal; Notable for the following:    RBC 3.55 (*)    Hemoglobin 10.3 (*)    HCT 31.3 (*)    RDW 15.5 (*)    All other components within normal limits  COMPREHENSIVE METABOLIC PANEL - Abnormal; Notable for the following:    Glucose, Bld 445 (*)    BUN 28 (*)    Creatinine, Ser 1.76 (*)    ALT 12 (*)    GFR calc non Af Amer 28 (*)    GFR calc Af Amer 33 (*)    All other components within normal limits  TROPONIN I   ____________________________________________  FINAL ASSESSMENT AND PLAN  Asymptomatic hypertension, anxiety, hyperglycemia  Plan: Patient with labs as dictated above. Patient states she forgot to give herself her insulin this evening. Her labs are grossly unremarkable and actually improved compared to prior. I will prescribe her Ativan she can  take as needed for anxiety, her blood pressure is currently 150 without any treatment which likely indicates that stress or anxiety related.   Earleen Newport, MD   Earleen Newport, MD 02/17/16 2228

## 2016-02-17 NOTE — ED Notes (Signed)
Pt arrived to ED via EMS after taking BP at home and having a reading of 240/120. EMS reports pt was anxious upon their arrival due to BP. Pt reports she has been taking BP medications as prescribed. Pt denies pain at this time. Pt alert and oriented x4.

## 2016-02-18 NOTE — Progress Notes (Signed)
MICHEALE, SCANLIN ([id number]) Visit Report for 02/16/2016 Arrival Information Details Patient Name: [name], [name] Date of Service: 02/16/2016 1:30 PM Medical Record Number: [id number] Patient Account Number: [id number] Date of Birth/Sex: 05-28-1946 (70 y.o. Female) Treating RN: [name] Primary Care Physician: Glendon Axe Other Clinician: Referring Physician: Glendon Axe Treating Physician/Extender: Frann Rider in Treatment: 44 Visit Information History Since Last Visit Added or deleted any medications: No Patient Arrived: Cane Any new allergies or adverse reactions: No Arrival Time: 13:54 Had a fall or experienced change in No Accompanied By: self activities of daily living that may affect Transfer Assistance: None risk of falls: Patient Identification Verified: Yes Signs or symptoms of abuse/neglect since last No Secondary Verification Process Yes visito Completed: Hospitalized since last visit: No Patient Requires Transmission- No Pain Present Now: No Based Precautions: Patient Has Alerts: Yes Patient Alerts: Patient on Blood Thinner Electronic Signature(s) Signed: 02/17/2016 5:03:54 PM By: [name] Entered By: [name] on 02/16/2016 13:55:02 Unity, Caila D. ([id number]) -------------------------------------------------------------------------------- Encounter Discharge Information Details Patient Name: Kristin, [name] Date of Service: 02/16/2016 1:30 PM Medical Record Number: [id number] Patient Account Number: [id number] Date of Birth/Sex: December 25, 1945 (70 y.o. Female) Treating RN: [name] Primary Care Physician: Glendon Axe Other Clinician: Referring Physician: Glendon Axe Treating Physician/Extender: Frann Rider in Treatment: 52 Encounter Discharge Information Items Discharge Pain Level: 0 Discharge Condition: Stable Ambulatory Status: Cane Discharge Destination: Home Transportation: Private Auto Accompanied  By: self Schedule Follow-up Appointment: Yes Medication Reconciliation completed No and provided to Patient/Care Shannen Vernon: Provided on Clinical Summary of Care: 02/16/2016 Form Type Recipient Paper Patient LM Electronic Signature(s) Signed: 02/16/2016 2:43:37 PM By: Ruthine Dose Entered By: Ruthine Dose on 02/16/2016 14:43:36 Nery, Hansika D. ([id number]) -------------------------------------------------------------------------------- Multi Wound Chart Details Patient Name: Kristin Stare D. Date of Service: 02/16/2016 1:30 PM Medical Record Number: [id number] Patient Account Number: [id number] Date of Birth/Sex: [birth year] (70 y.o. Female) Treating RN: [name] Primary Care Physician: Glendon Axe Other Clinician: Referring Physician: Glendon Axe Treating Physician/Extender: Frann Rider in Treatment: 54 Vital Signs Height(in): 65 Pulse(bpm): 99 Weight(lbs): 248 Blood Pressure 191/76 (mmHg): Body Mass Index(BMI): 41 Temperature(F): 98.0 Respiratory Rate 18 (breaths/min): Photos: [5:No Photos] [6:No Photos] [N/A:N/A] Wound Location: [5:Right Lower Leg - Posterior] [6:Right Lower Leg - Medial N/A] Wounding Event: [5:Gradually Appeared] [6:Gradually Appeared] [N/A:N/A] Primary Etiology: [5:Venous Leg Ulcer] [6:Diabetic Wound/Ulcer of the Lower Extremity] [N/A:N/A] Comorbid History: [5:Cataracts, Asthma, Coronary Artery Disease, Coronary Artery Disease, Hypertension, Type II Diabetes, Osteoarthritis, Diabetes, Osteoarthritis, Neuropathy] [6:Cataracts, Asthma, Hypertension, Type II Neuropathy] [N/A:N/A] Date Acquired: [5:04/29/2015] [6:10/10/2015] [N/A:N/A] Weeks of Treatment: [5:38] [6:18] [N/A:N/A] Wound Status: [5:Open] [6:Open] [N/A:N/A] Measurements L x W x D 2.2x2.5x0.1 [6:5.5x3.2x0.1] [N/A:N/A] (cm) Area (cm) : [5:4.32] [6:13.823] [N/A:N/A] Volume (cm) : [5:0.432] [6:1.382] [N/A:N/A] % Reduction in Area: [5:-2197.90%] [6:-300.00%] [N/A:N/A] %  Reduction in Volume: -2173.70% [6:-299.40%] [N/A:N/A] Classification: [5:Full Thickness Without Exposed Support Structures] [6:Grade 1] [N/A:N/A] HBO Classification: [5:Grade 2] [6:N/A] [N/A:N/A] Exudate Amount: [5:Medium] [6:Small] [N/A:N/A] Exudate Type: [5:Serosanguineous] [6:Serous] [N/A:N/A] Exudate Color: [5:red, brown] [6:amber] [N/A:N/A] Wound Margin: [5:Indistinct, nonvisible] [6:Flat and Intact] [N/A:N/A] Granulation Amount: [5:Large (67-100%)] [6:Large (67-100%)] [N/A:N/A] Granulation Quality: [5:Pink, Pale] [6:Red] [N/A:N/A] Necrotic Amount: Small (1-33%) Small (1-33%) N/A Necrotic Tissue: Adherent Slough Eschar, Adherent Slough N/A Exposed Structures: Fascia: No Fascia: No N/A Fat: No Fat: No Tendon: No Tendon: No Muscle: No Muscle: No Joint: No Joint: No Bone: No Bone: No Limited to Skin Limited to Skin Breakdown Breakdown Epithelialization: None None N/A Periwound Skin Texture: Edema: Yes  Edema: No N/A Excoriation: No Excoriation: No Induration: No Induration: No Callus: No Callus: No Crepitus: No Crepitus: No Fluctuance: No Fluctuance: No Friable: No Friable: No Rash: No Rash: No Scarring: No Scarring: No Periwound Skin Moist: Yes Moist: Yes N/A Moisture: Maceration: No Maceration: No Dry/Scaly: No Dry/Scaly: No Periwound Skin Color: Atrophie Blanche: No Atrophie Blanche: No N/A Cyanosis: No Cyanosis: No Ecchymosis: No Ecchymosis: No Erythema: No Erythema: No Hemosiderin Staining: No Hemosiderin Staining: No Mottled: No Mottled: No Pallor: No Pallor: No Rubor: No Rubor: No Temperature: No Abnormality No Abnormality N/A Tenderness on No Yes N/A Palpation: Wound Preparation: Ulcer Cleansing: Ulcer Cleansing: N/A Rinsed/Irrigated with Rinsed/Irrigated with Saline Saline Topical Anesthetic Topical Anesthetic Applied: Other: lidocaine Applied: Other: lidocaine 4% 4% Procedures Performed: Cellular or Tissue Based Cellular or  Tissue Based N/A Product Product Treatment Notes Electronic Signature(s) Signed: 02/17/2016 5:03:54 PM By: [name] Entered By: [name] on 02/16/2016 14:31:00 [name], [name] ([id number]) [place] ([id number]) -------------------------------------------------------------------------------- Multi-Disciplinary Care Plan Details Patient Name: DEEKSHA, MCLELAND D. Date of Service: 02/16/2016 1:30 PM Medical Record Number: [id number] Patient Account Number: [id number] Date of Birth/Sex: 29-Nov-1945 (70 y.o. Female) Treating RN: [name] Primary Care Physician: Glendon Axe Other Clinician: Referring Physician: Glendon Axe Treating Physician/Extender: Frann Rider in Treatment: 3 Active Inactive Orientation to the Wound Care Program Nursing Diagnoses: Knowledge deficit related to the wound healing center program Goals: Patient/caregiver will verbalize understanding of the [place] Program Date Initiated: 01/31/2015 Goal Status: Active Interventions: Provide education on orientation to the wound center Notes: Venous Leg Ulcer Nursing Diagnoses: Potential for venous Insuffiency (use before diagnosis confirmed) Goals: Non-invasive venous studies are completed as ordered Date Initiated: 01/31/2015 Goal Status: Active Patient/caregiver will verbalize understanding of disease process and disease management Date Initiated: 01/31/2015 Goal Status: Active Interventions: Assess peripheral edema status every visit. Notes: Wound/Skin Impairment Nursing Diagnoses: Impaired tissue integrity Knowledge deficit related to smoking impact on wound healing Cowin, Levita D. ([id number]) Goals: Patient/caregiver will verbalize understanding of skin care regimen Date Initiated: 01/31/2015 Goal Status: Active Ulcer/skin breakdown will heal within 14 weeks Date Initiated: 01/31/2015 Goal Status: Active Interventions: Assess ulceration(s) every  visit Notes: Electronic Signature(s) Signed: 02/17/2016 5:03:54 PM By: [name] Entered By: [name] on 02/16/2016 14:30:36 Co[place]es, Chiquetta D. ([id number]) -------------------------------------------------------------------------------- Patient/Caregiver Education Details Patient Name: Kristin Stare D. Date of Service: 02/16/2016 1:30 PM Medical Record Number: [id number] Patient Account Number: [id number] Date of Birth/Gender: Jul 12, 1946 (70 y.o. Female) Treating RN: [name] Primary Care Physician: Glendon Axe Other Clinician: Referring Physician: Glendon Axe Treating Physician/Extender: Frann Rider in Treatment: 56 Education Assessment Education Provided To: Patient Education Topics Provided Wound/Skin Impairment: Handouts: Other: wound care if needed this week Methods: Demonstration, Explain/Verbal Responses: State content correctly Electronic Signature(s) Signed: 02/17/2016 5:03:54 PM By: [name] Entered By: [name] on 02/16/2016 14:34:09 Woloszyn, Shawnise D. ([id number]) -------------------------------------------------------------------------------- Wound Assessment Details Patient Name: Carithers, Jermya D. Date of Service: 02/16/2016 1:30 PM Medical Record Number: [id number] Patient Account Number: [id number] Date of Birth/Sex: 09/04/1946 (70 y.o. Female) Treating RN: [name] Primary Care Physician: Glendon Axe Other Clinician: Referring Physician: Glendon Axe Treating Physician/Extender: Frann Rider in Treatment: 71 Wound Status Wound Number: 5 Primary Venous Leg Ulcer Etiology: Wound Location: Right Lower Leg - Posterior Wound Open Wounding Event: Gradually Appeared Status: Date Acquired: 04/29/2015 Comorbid Cataracts, Asthma, Coronary Artery Weeks Of Treatment: 38 History: Disease, Hypertension, Type II Clustered Wound: No Diabetes, Osteoarthritis, Neuropathy Photos Photo Uploaded By: [name] on  02/17/2016 16:10:58 Wound Measurements Length: (cm) 2.2 Width: (cm) 2.5 Depth: (cm) 0.1 Area: (cm) 4.32 Volume: (cm) 0.432 % Reduction in Area: -2197.9% % Reduction in Volume: -2173.7% Epithelialization: None Tunneling: No Undermining: No Wound Description Full Thickness Without Exposed Foul Odor A Classification: Support Structures Diabetic Severity Grade 2 ([place]): Wound Margin: Indistinct, nonvisible Exudate Amount: Medium Exudate Type: Serosanguineous Exudate Color: red, brown fter Cleansing: No Wound Bed Granulation Amount: Large (67-100%) Exposed Structure [place], Candida D. ([id number]) Granulation Quality: Pink, Pale Fascia Exposed: No Necrotic Amount: Small (1-33%) Fat Layer Exposed: No Necrotic Quality: Adherent Slough Tendon Exposed: No Muscle Exposed: No Joint Exposed: No Bone Exposed: No Limited to Skin Breakdown Periwound Skin Texture Texture Color No Abnormalities Noted: No No Abnormalities Noted: No Callus: No Atrophie Blanche: No Crepitus: No Cyanosis: No Excoriation: No Ecchymosis: No Fluctuance: No Erythema: No Friable: No Hemosiderin Staining: No Induration: No Mottled: No Localized Edema: Yes Pallor: No Rash: No Rubor: No Scarring: No Temperature / Pain Moisture Temperature: No Abnormality No Abnormalities Noted: No Dry / Scaly: No Maceration: No Moist: Yes Wound Preparation Ulcer Cleansing: Rinsed/Irrigated with Saline Topical Anesthetic Applied: Other: lidocaine 4%, Treatment Notes Wound #5 (Right, Posterior Lower Leg) 1. Cleansed with: Clean wound with Normal Saline 2. Anesthetic Topical Lidocaine 4% cream to wound bed prior to debridement 3. Peri-wound Care: Skin Prep 4. Dressing Applied: Mepitel Other dressing (specify in notes) 5. Secondary Dressing Applied Bordered Foam Dressing Dry Gauze Notes drawtex, Apligraf applied in clinic today by Dr Maura Crandall, Alyssa D. ([id number]) Electronic  Signature(s) Signed: 02/17/2016 5:03:54 PM By: [name] Entered By: [name] on 02/16/2016 14:13:38 Ow, Khalie D. ([id number]) -------------------------------------------------------------------------------- Wound Assessment Details Patient Name: ROXINE, HEIDBRINK D. Date of Service: 02/16/2016 1:30 PM Medical Record Number: [id number] Patient Account Number: [id number] Date of Birth/Sex: 1946-06-17 (70 y.o. Female) Treating RN: [name] Primary Care Physician: Glendon Axe Other Clinician: Referring Physician: Glendon Axe Treating Physician/Extender: Frann Rider in Treatment: 8 Wound Status Wound Number: 6 Primary Diabetic Wound/Ulcer of the Lower Etiology: Extremity Wound Location: Right Lower Leg - Medial Wound Open Wounding Event: Gradually Appeared Status: Date Acquired: 10/10/2015 Comorbid Cataracts, Asthma, Coronary Artery Weeks Of Treatment: 18 History: Disease, Hypertension, Type II Clustered Wound: No Diabetes, Osteoarthritis, Neuropathy Photos Photo Uploaded By: [name] on 02/17/2016 16:10:41 Wound Measurements Length: (cm) 5.5 % Reduction in Width: (cm) 3.2 % Reduction in Depth: (cm) 0.1 Epithelializati Area: (cm) 13.823 Tunneling: Volume: (cm) 1.382 Undermining: Area: -300% Volume: -299.4% on: None No No Wound Description Classification: Grade 1 Foul Odor After Wound Margin: Flat and Intact Exudate Amount: Small Exudate Type: Serous Exudate Color: amber Cleansing: No Wound Bed Granulation Amount: Large (67-100%) Exposed Structure Granulation Quality: Red Fascia Exposed: No Necrotic Amount: Small (1-33%) Fat Layer Exposed: No Necrotic Quality: Eschar, Adherent Slough Tendon Exposed: No Diep, Jerlean D. ([id number]) Muscle Exposed: No Joint Exposed: No Bone Exposed: No Limited to Skin Breakdown Periwound Skin Texture Texture Color No Abnormalities Noted: No No Abnormalities Noted: No Callus: No Atrophie  Blanche: No Crepitus: No Cyanosis: No Excoriation: No Ecchymosis: No Fluctuance: No Erythema: No Friable: No Hemosiderin Staining: No Induration: No Mottled: No Localized Edema: No Pallor: No Rash: No Rubor: No Scarring: No Temperature / Pain Moisture Temperature: No Abnormality No Abnormalities Noted: No Tenderness on Palpation: Yes Dry / Scaly: No Maceration: No Moist: Yes Wound Preparation Ulcer Cleansing: Rinsed/Irrigated with Saline Topical Anesthetic Applied: Other: lidocaine 4%, Treatment Notes Wound #6 (Right, Medial Lower Leg) 1. Cleansed with: Clean  wound with Normal Saline 2. Anesthetic Topical Lidocaine 4% cream to wound bed prior to debridement 3. Peri-wound Care: Skin Prep 4. Dressing Applied: Mepitel Other dressing (specify in notes) 5. Secondary Dressing Applied Bordered Foam Dressing Dry Gauze Notes drawtex, Apligraf applied in clinic today by Dr Con Memos Electronic Signature(s) Signed: 02/17/2016 5:03:54 PM By: Dorthey Sawyer, KAMAL D. ([id number]) Entered By: [name] on 02/16/2016 14:15:39 [place], Sarinah D. ([id number]) -------------------------------------------------------------------------------- Vitals Details Patient Name: MALANI, CRESSWELL D. Date of Service: 02/16/2016 1:30 PM Medical Record Number: [id number] Patient Account Number: [id number] Date of Birth/Sex: 1946/01/14 (70 y.o. Female) Treating RN: [name] Primary Care Physician: Glendon Axe Other Clinician: Referring Physician: Glendon Axe Treating Physician/Extender: Frann Rider in Treatment: 4 Vital Signs Time Taken: 13:56 Temperature (F): 98.0 Height (in): 65 Pulse (bpm): 99 Weight (lbs): 248 Respiratory Rate (breaths/min): 18 Body Mass Index (BMI): 41.3 Blood Pressure (mmHg): 191/76 Reference Range: 80 - 120 mg / dl Electronic Signature(s) Signed: 02/17/2016 5:03:54 PM By: [name] Entered By: [name] on 02/16/2016 13:57:23

## 2016-02-18 NOTE — Progress Notes (Signed)
KEN, OTOOLE ([id number]) Visit Report for 02/16/2016 Chief Complaint Document Details Patient Name: [name], [name] 02/16/2016 1:30 Date of Service: PM Medical Record [id number] Number: Patient Account Number: [id number] 04-Sep-1946 (70 y.o. Treating RN: [name] Date of Birth/Sex: Female) Other Clinician: Primary Care Physician: [facility], Delana Meyer Treating [name] Referring Physician: Glendon Axe Physician/Extender: Weeks in Treatment: 36 Information Obtained from: Patient Chief Complaint Patient presents to the wound care center for [name] consult due non healing wound 70 year old patient comes with [name] history of having [name] ulcer on the left lower extremity for the past 4 weeks. she says she's had swelling of both lower extremities for about [name] year after she started having prednisone. 02/07/2015 -- her vascular appointments obtained were in the first and third week of June. she is able to go to [place] and we will try and get her some earlier appointments. Other than that nothing else has changed in her management. Electronic Signature(s) Signed: 02/16/2016 2:48:06 PM By: [name], [name] Entered By: [name] on 02/16/2016 14:48:06 Rae, JEVONNE FRASE ([id number]) -------------------------------------------------------------------------------- Cellular or Tissue Based Product Details Patient Name: [name], [name] 02/16/2016 1:30 Date of Service: PM Medical Record [id number] Number: Patient Account Number: [id number] November 11, 1945 (70 y.o. Treating RN: [name] Date of Birth/Sex: Female) Other Clinician: Primary Care Physician: [facility], Delana Meyer Treating [name] Referring Physician: Glendon Axe Physician/Extender: Weeks in Treatment: 33 Cellular or Tissue Based Wound #5 Right,Posterior Lower Leg Product Type Applied to: Performed By: Physician [name], MD Cellular or Tissue Based Apligraf Product Type: Time-Out Taken: Yes Location: genitalia  / hands / feet / multiple digits Wound Size (sq cm): 5.5 Product Size (sq cm): 44 Waste Size (sq cm): 0 Waste Reason: wound size Amount of Product Applied (sq cm): 44 Lot #: [id number] Expiration Date: 02/21/2016 Fenestrated: Yes Instrument: Blade Reconstituted: Yes Solution Type: saline Solution Amount: 31ml Lot #: [id number] Solution Expiration 10/25/2017 Date: Secured: Yes Secured With: Steri-Strips Dressing Applied: Yes Primary Dressing: meptiel one Procedural Pain: 0 Post Procedural Pain: 0 Response to Treatment: Procedure was tolerated well Post Procedure Diagnosis Same as Pre-procedure Notes the remaining Apligraf was applied to the right posterior medial wound. [name], [name] ([id number]) Electronic Signature(s) Signed: 02/16/2016 2:47:22 PM By: [name], [name] Entered By: [name] on 02/16/2016 14:47:21 Hoskinson, ROOSEVELT GORDNER ([id number]) -------------------------------------------------------------------------------- Cellular or Tissue Based Product Details Patient Name: [name], [name] 02/16/2016 1:30 Date of Service: PM Medical Record [id number] Number: Patient Account Number: [id number] 12/13/1945 (70 y.o. Treating RN: [name] Date of Birth/Sex: Female) Other Clinician: Primary Care Physician: [facility], Delana Meyer Treating [name] Referring Physician: Glendon Axe Physician/Extender: Weeks in Treatment: 5 Cellular or Tissue Based Wound #6 Right,Medial Lower Leg Product Type Applied to: Performed By: Physician [name], MD Cellular or Tissue Based Apligraf Product Type: Time-Out Taken: Yes Location: genitalia / hands / feet / multiple digits Wound Size (sq cm): 17.6 Product Size (sq cm): 38 Waste Size (sq cm): 20 Waste Reason: wound size Amount of Product Applied (sq cm): 18 Lot #: [id number] Expiration Date: 02/21/2016 Fenestrated: Yes Instrument: Blade Reconstituted: Yes Solution Type: saline Solution Amount: 26ml Lot #:  [id number] Solution Expiration 10/25/2017 Date: Secured: Yes Secured With: Steri-Strips Dressing Applied: Yes Primary Dressing: meptiel one Procedural Pain: 0 Post Procedural Pain: 0 Response to Treatment: Procedure was tolerated well Post Procedure Diagnosis Same as Pre-procedure Electronic Signature(s) Signed: 02/16/2016 2:47:56 PM By: [name], [name] Logsdon, Jasmain D. ([id number]) Entered By: [name] on 02/16/2016 14:47:55 Mcclintic,  MRIDULA PIZZI ([id number]) -------------------------------------------------------------------------------- HPI Details Patient Name: [name], [name] 02/16/2016 1:30 Date of Service: PM Medical Record [id number] Number: Patient Account Number: [id number] 1946-02-07 (70 y.o. Treating RN: [name] Date of Birth/Sex: Female) Other Clinician: Primary Care Physician: [facility], Delana Meyer Treating Ricki Vanhandel Referring Physician: Glendon Axe Physician/Extender: Weeks in Treatment: 50 History of Present Illness HPI Description: 70 year old patient who is known to have diabetes mellitus type 2, chronic renal insufficiency, coronary artery disease, hypertension, hypercholesterolemia, temporal arteritis and inflammatory arthritiss also has [name] history of having [name] hysterectomy and some orthopedic related surgeries. The ulcer on the left lower extremity started off as [name] blister and then. Got progressively worse. She does not have any fever or chills and has not had any recent surgical intervention for this. Her last hemoglobin A1c was 10.1 in September 2015. She has been recently put on doxycycline by her PCP. She is now also allergic to doxycycline and was this was changed over to Keflex. due to her temporal arteritis she has been on prednisone for about [name] year and she says ever since that she has had swelling of both lower extremities. She does see [name] cardiologist and also takes [name] diuretic. 02/07/2015 her arterial and venous duplex studies to be done have  dates been given as the first and third week of June. This is at [facility]. We are going to try and get early appointments at [facility]. other than that nothing has changed in her management. 02/14/2015 -- we have been able to get her an appointment in [facility] on May 20 which is much earlier than her previous ones at [place]. She continues with her prednisone and her sugars are in the range of 150-200. 02/21/2015 We were able to get [name] vascular lab workup for her today and she is going to be there at 2:00 this afternoon. the swelling of her leg has gone down significantly but she still has some tenderness over the wounds. 02/28/2015 - She has had one of two vascular workups done, and this coming Tuesday has another, at [facility]. She continues to be on steroid medications. She has significant sensitivity in her left lower extremity and has pain suggestive of neuropathic pain and I have asked her to address this with her primary care physician. 03/07/2015 -- The patient saw Dr. Lucky Cowboy for [name] consultation and he has had her arteries are okay but she has 2 incompetent veins on the left lower extremity and he is going to set her up for surgery. Official report is awaited. Addendum: Official reports are now available and on 03/04/2015. She was seen and lower extremity venous duplex exam was done. There was reflux present within the left greater saphenous vein below the knee and also the left small saphenous vein. Arterial duplex showed normal triphasic waveforms throughout the left lower extremity without any significant stenosis. Her ABIs were noncompressible bilaterally but [name] waveforms were normal and [name] digital pressures were normal bilaterally consistent with no significant arterial insufficiency. He has recommended endovenous ablation of both the left small saphenous and the left great saphenous vein. This would still be scheduled later. 03/14/2015 -- she has  heard back from the vascular office and has surgery scheduled for sometime in July. [name], [name] ([id number]) Her rheumatologist has decreased her prednisone dosage but she still on it. She has also had cataract surgery in her right eye recently this week. 03/20/2015 - No new complaints today. Pain improved. No fever or chills. Tolerating 2  layer compression. 04/14/2015 -- she was doing very well today she went off on vacation and now her edema has increased markedly the ulceration is bigger and her diabetes is not under control. 04/21/2015 -- I spoke to her PCP Dr. Candiss Norse and discussed the management which would include being seen by [name] general surgeon for debridement and taking multiple punch biopsies which would help in establishing the diagnosis of this is [name] vasculitis. She is agreeable about this and will set her up for the procedure with Dr. Tamala Julian at [facility]. She was seen by the surgeon Dr. Jamal Collin. 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 She is scheduled to have laser ablation done next week.Pt was sent here for possible biopsy to look for vasculitis. Feel it would be better to wait after laser ablation is completed- the ulcer may heal fully and biopsy may not be necessary 04/29/2015 -- she had the venous ablation done by Dr. Lucky Cowboy last Friday and we do not have any notes yet. She is doing fine otherwise. 05/06/2015 --Review of her recent vascular intervention shows that she was seen by Dr. Lucky Cowboy on 04/29/2015. The follow-up duplex which was done showed that both the great saphenous vein and the small saphenous vein remained patent with reflux consistent with an unsuccessful ablation. He has rescheduled her for another the endovenous ablation to be done in about 4 weekso time. 05/13/2015 -- he was seen by her surgeon Dr. Jamal Collin who asked her to continue with  conservative therapy and he would speak to Dr. Lucky Cowboy about her management. Dr. Lucky Cowboy is going to schedule her surgery in the middle of August for [name] repeat endovenous ablation. Her pus culture from last week has grown : [place] her noted her sensitivity report but due to her multiple allergies I had tried clindamycin and she developed [name] rash with this too. She has been prescribed and anti-buttocks in the ER and is has it at home and she will let is know what she is going to be taking. 05/20/2015 -- she has developed [name] small spot on her right lower extremity but besides that it is not [name] full fledged ulceration. She did not get to see Dr. Lucky Cowboy last week and hopefully she will see him in the near future. 05/27/2015 -she is still awaiting her appointment with Dr.Dew and her vascular procedure is not scheduled until August 19. She will be seeing her PCP tomorrow and I have asked her to convey our discussion so that she is aware that debridement has not been done yet. 06/03/2015 -- was seen by her rheumatologist Dr. 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 He is lowering her prednisone to 12-1/2 mg for 1 month and then 10 mg per the next month. I will again make an attempt to speak to her PCP Dr. Candiss Norse and her surgeon Dr. Lucky Cowboy to see if he can organize for [name] debridement in the OR with multiple biopsies to establish [name] diagnosis of vasculitis or pyoderma gangrenosum. 06/17/2015 -- Dr.Dew did her vascular procedure last week and [name] follow-up venous ultrasound shows good resolution of the veins as per the patient's history. He is to see her back in 2 weeks. [name], [name] ([id number]) 07/07/2015. -- the patient has had [name] heavy  growth of Proteus mirabilis and Enterococcus faecalis. These are  sensitive to several drugs but the problem is she has allergies to all of these and hence I would like her to see Dr. Ola Spurr for this. She is also due to see Dr. Lucky Cowboy tomorrow and I will discussed the management with him including debridement under anesthesia and possible biopsies. 07/14/2015 -- she has an appointment to see Dr. Ola Spurr tomorrow and did see Dr. Bunnie Domino PA who will discuss my request with him. 07/21/2015 -- saw Dr. Ola Spurr was able to do [name] test on her and has put her on amoxicillin. She has been tolerating that and has had no problems with allergies to this. 07/28/2015 -- Last Friday I spoke to Dr. Leotis Pain regarding her care and he said that her right leg did not need any surgery and on the left leg was doing pretty good. We did agree that if she undergoes any procedure in the future he would do [name] couple of punch biopsies of the wound. 08/18/2015 -- her right leg is very tender and there is significant amount of slough. The left leg is looking much better 09/02/2015 -- she is going to have [name] debridement and punch biopsies of her right lower extremity by Dr. Leotis Pain this coming Thursday. Also seen Dr. Ola Spurr who has continued her on ciprofloxacin. 09/09/2015 -- on 09/04/2015 Dr. Leotis Pain took her to surgery - Irrigation and excisional debridement of skin, soft tissue, and muscle to about 40 cm2 to the right posterior calf with biopsy. Pathology results are -- DIAGNOSIS: SKIN, RIGHT LOWER EXTREMITY; BIOPSY: SKIN AND SOFT TISSUE WITH ULCERATION, SEE NOTE. - NEGATIVE FOR DYSPLASIA AND MALIGNANCY. Note: There is acute inflammation and ulceration of the epidermis. The dermis shows nonspecific inflammation, neovascularization, and hemosiderin deposition. The differential diagnosis for these findings includes stasis dermatitis, nonspecific dermatitis, and infection. Correlation with clinical findings is required. [name] PAS fungal stain is obtained and results will be reported  in an addendum. cultures were also sent and this grew Pseudomonas aeruginosa, Escherichia coli, Proteus mirabilis, Klebsiella pneumoniae and they were all sensitive to ciprofloxacin which she is on. 09/16/2015 -- he saw Dr. 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 He is going to call the patient in and discuss with her the management possibly with Imuran. I will continue treating her locally. 09/23/2015 -- she saw Dr. Ola Spurr today who was going to continue the antibiotics for now and stop after this course. She has an appointment to see Dr. Jefm Bryant in about [name] week's time. Her wound VAC has arrived but she did not bring it with her today. We will try and set her up for changes to the right leg 3 times [name] week. She is here for her first application of Apligraf to the left lower extremity. 09/30/2015 -- she has seen Dr. Jefm Bryant little today and he has done [name] blood test and is awaiting the results before starting on treatment for pyoderma gangrenosum. because she is ambulatory at home health will not apply [name] wound VAC and she will have to come here 3 times [name] week on Monday Wednesday and Friday. 10/10/2015 -- she is here for [name] second application of Apligraf to her left lower extremity. 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 Jefm Bryant. Other than that she is doing well 10/31/2015 --  she is here for [name] third application of Apligraf to her left lower extremity. after reviewing her wound on the right side I noted that it is granulating extremely well and we will use the remnants of the Apligraf on the right leg. JALAYIAH, EINSPAHR ([id number]) 11/14/2015 -- she was recently admitted to the hospital between January 11 and 11/10/2015 for uncontrolled hypertension, diabetes mellitus, acute kidney injury. during her  admission she was supported by wound care and also by antibiotics. Around this time her immunosuppression was stopped by Dr. Nunzio Cory. She is feeling much better now. 11/21/2015 -- she has had her fourth application of Apligraf today and it has been shared between the left and the right leg 11/24/2015 -- was here for [name] wound VAC change but it was noticed that she had profuse bleeding from the lobe medial part of her right lower extremity and I was called in to evaluate her. 11/28/2015 -- last week she saw the PA at the vascular office who injected her bleeding varicose veins with [name] sclerotherapeutic agent. She has been doing fine since then 12/19/2015 -- she has finished 5 applications of Apligraf to the left lower extremity and this is doing great. He began with pain and swelling of her right ankle [name] couple of days ago and it has caused her much distress. She does not have any fever or any change in her general health. 12/22/2015 -- the patient's pain and swelling of the right lower extremity has resolved over the weekend. The left lower extremity is completely healed. She has had the first application of Apligraf on the right leg today. 01/02/2016 -- She is here for the second application of Apligraf on the right leg today. 01/19/2016 -- she is here for the third application of Apligraf to the right posterior lateral leg. 02/02/2016 -- today she has had her fourth application of Apligraf and we have applied some of it to the right posterior lateral leg and the remaining waist we have used for the left anterior medial leg. 02/12/2016 -- today she is use the fifth application of Apligraf to the right posterior lateral leg. The remaining to be wasted has been applied to the left anterior medial leg. Electronic Signature(s) Signed: 02/16/2016 2:48:52 PM By: [name], [name] Entered By: [name] on 02/16/2016 14:48:51 Crumpacker, CHAYA KIBE  ([id number]) -------------------------------------------------------------------------------- Physical Exam Details Patient Name: [name], [name] 02/16/2016 1:30 Date of Service: PM Medical Record [id number] Number: Patient Account Number: [id number] 05/09/1946 (70 y.o. Treating RN: [name] Date of Birth/Sex: Female) Other Clinician: Primary Care Physician: [facility], Delana Meyer Treating [name] Referring Physician: Glendon Axe Physician/Extender: Weeks in Treatment: 54 Constitutional . Pulse regular. Respirations normal and unlabored. Afebrile. . Eyes Nonicteric. Reactive to light. Ears, Nose, Mouth, and Throat Lips, teeth, and gums WNL.Marland Kitchen Moist mucosa without lesions. Neck supple and nontender. No palpable supraclavicular or cervical adenopathy. Normal sized without goiter. Respiratory WNL. No retractions.. Cardiovascular Pedal Pulses WNL. No clubbing, cyanosis or edema. Lymphatic No adneopathy. No adenopathy. No adenopathy. Musculoskeletal Adexa without tenderness or enlargement.. Digits and nails w/o clubbing, cyanosis, infection, petechiae, ischemia, or inflammatory conditions.. Integumentary (Hair, Skin) No suspicious lesions. No crepitus or fluctuance. No peri-wound warmth or erythema. No masses.Marland Kitchen Psychiatric Judgement and insight Intact.. No evidence of depression, anxiety, or agitation.. Notes the right lower extremity posterior lateral wound has some hyper granulation tissue and this has been cauterized with silver nitrate stick and then for the open areas have been covered with the Apligraf. The  rest of the Apligraf was applied to the left anterior medial wound and both wounds were dressed with appropriate bolsters and Steri-Strips. Electronic Signature(s) Signed: 02/16/2016 2:50:30 PM By: [name], [name] Entered By: [name] on 02/16/2016 14:50:29 [name], [name]  ([id number]) -------------------------------------------------------------------------------- Physician Orders Details Patient Name: [name], [name] 02/16/2016 1:30 Date of Service: PM Medical Record [id number] Number: Patient Account Number: [id number] [birth year] (70 y.o. Treating RN: [name] Date of Birth/Sex: Female) Other Clinician: Primary Care Physician: [facility], Delana Meyer Treating Giorgia Wahler Referring Physician: Glendon Axe Physician/Extender: Weeks in Treatment: 52 Verbal / Phone Orders: Yes Clinician: [name] Read Back and Verified: Yes Diagnosis Coding Wound Cleansing Wound #5 Right,Posterior Lower Leg o Cleanse wound with mild soap and water Wound #6 Right,Medial Lower Leg o Cleanse wound with mild soap and water Anesthetic Wound #5 Right,Posterior Lower Leg o Topical Lidocaine 4% cream applied to wound bed prior to debridement Wound #6 Right,Medial Lower Leg o Topical Lidocaine 4% cream applied to wound bed prior to debridement Skin Barriers/Peri-Wound Care Wound #5 Right,Posterior Lower Leg o Skin Prep Wound #6 Right,Medial Lower Leg o Skin Prep Primary Wound Dressing Wound #5 Right,Posterior Lower Leg o Dry Gauze o Boardered Foam Dressing o Drawtex o Mepitel One - over Apligraf Wound #6 Right,Medial Lower Leg o Dry Gauze o Boardered Foam Dressing o Drawtex o Mepitel One - over Apligraf [name], [name] ([id number]) Dressing Change Frequency Wound #5 Right,Posterior Lower Leg o Change dressing every week Wound #6 Right,Medial Lower Leg o Change dressing every week Follow-up Appointments Wound #5 Right,Posterior Lower Leg o Return Appointment in 1 week. Wound #6 Right,Medial Lower Leg o Return Appointment in 1 week. Edema Control Wound #5 Right,Posterior Lower Leg o Elevate legs to the level of the heart and pump ankles as often as possible o Other: - wear compression hose Wound #6  Right,Medial Lower Leg o Elevate legs to the level of the heart and pump ankles as often as possible o Other: - wear compression hose Advanced Therapies Wound #5 Right,Posterior Lower Leg o Apligraf application in clinic; including contact layer, fixation with steri strips, dry gauze and cover dressing. Wound #6 Right,Medial Lower Leg o Apligraf application in clinic; including contact layer, fixation with steri strips, dry gauze and cover dressing. Electronic Signature(s) Signed: 02/16/2016 3:39:07 PM By: [name], [name] Signed: 02/17/2016 5:03:54 PM By: [name] Entered By: [name] on 02/16/2016 14:32:40 Racanelli, KENDRY PUTHOFF ([id number]) -------------------------------------------------------------------------------- Problem List Details Patient Name: AIZA, COTLER D. 02/16/2016 1:30 Date of Service: PM Medical Record [id number] Number: Patient Account Number: [id number] November 29, 1945 (70 y.o. Treating RN: [name] Date of Birth/Sex: Female) Other Clinician: Primary Care Physician: Glendon Axe Treating [name] Referring Physician: Glendon Axe Physician/Extender: Weeks in Treatment: 31 Active Problems ICD-10 Encounter Code Description Active Date Diagnosis E11.622 Type 2 diabetes mellitus with other skin ulcer 01/31/2015 Yes E66.01 Morbid (severe) obesity due to excess calories 01/31/2015 Yes I89.0 Lymphedema, not elsewhere classified 01/31/2015 Yes L97.212 Non-pressure chronic ulcer of right calf with fat layer 11/24/2015 Yes exposed I87.311 Chronic venous hypertension (idiopathic) with ulcer of 11/24/2015 Yes right lower extremity Inactive Problems Resolved Problems ICD-10 Code Description Active Date Resolved Date L97.322 Non-pressure chronic ulcer of left ankle with fat layer 01/31/2015 01/31/2015 exposed I83.222 Varicose veins of left lower extremity with both ulcer of 03/07/2015 03/07/2015 calf and inflammation I83.223 03/07/2015  03/07/2015 Bhattacharyya, Malikah D. 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 02/16/2016 1:30 Date of Service: PM Medical Record [id number] Number: Patient Account Number: [id number] [birth year] (70 y.o. Treating RN: [name] Date of Birth/Sex: Female) Other Clinician: Primary Care Physician: [facility], Delana Meyer Treating [name] Referring Physician: Glendon Axe Physician/Extender: Weeks in Treatment: 45 Subjective Chief Complaint Information obtained from Patient Patient presents to the wound care center for [name] consult due non healing wound 70 year old patient comes with [name] history of having [name] ulcer on the left lower extremity for the past 4 weeks. she says she's had swelling of both lower extremities for about [name] year after she started having prednisone. 02/07/2015 -- her vascular appointments obtained were in the first and third week of June. she is able to go to [place] and we will try and get her some earlier appointments. Other than that nothing else has changed in her management. History of Present Illness (HPI) 70 year old patient who is known to have diabetes mellitus type 2, chronic renal insufficiency, coronary artery disease, hypertension, hypercholesterolemia, temporal arteritis and inflammatory arthritiss also has [name] history of having [name] hysterectomy and some orthopedic related surgeries. The ulcer on the left lower extremity started off as [name] blister and then. Got progressively worse. She does not have any fever or chills and has not had any recent surgical intervention for this.  Her last hemoglobin A1c was 10.1 in September 2015. She has been recently put on doxycycline by her PCP. She is now also allergic to doxycycline and was this was changed over to Keflex. due to her temporal arteritis she has been on prednisone for about [name] year and she says ever since that she has had swelling of both lower extremities. She does see [name] cardiologist and also takes [name] diuretic. 02/07/2015 her arterial and venous duplex studies to be done have dates been given as the first and third week of June. This is at [facility]. We are going to try and get early appointments at [facility]. other than that nothing has changed in her management. 02/14/2015 -- we have been able to get her an appointment in [facility] on May 20 which is much earlier than her previous ones at [place]. She continues with her prednisone and her sugars are in the range of 150-200. 02/21/2015 We were able to get [name] vascular lab workup for her today and she is going to be there at 2:00 this afternoon. the swelling of her leg has gone down significantly but she still has some tenderness over the wounds. 02/28/2015 - She has had one of two vascular workups done, and this coming Tuesday has another, at [facility]. She continues to be on steroid medications. She has significant sensitivity in her left lower extremity and has pain suggestive of neuropathic pain and I have asked her to address this with her primary care physician. 03/07/2015 -- The patient saw Dr. Lucky Cowboy for [name] consultation and he has had her arteries are okay but she has 2 incompetent veins on the left lower extremity and he is going to set her up for surgery. Official report is NETTA, BRETZ ([id number]) awaited. Addendum: Official reports are now available and on 03/04/2015. She was seen and lower extremity venous duplex exam was done. There was reflux present within the left greater saphenous vein below the knee  and also the  left small saphenous vein. Arterial duplex showed normal triphasic waveforms throughout the left lower extremity without any significant stenosis. Her ABIs were noncompressible bilaterally but [name] waveforms were normal and [name] digital pressures were normal bilaterally consistent with no significant arterial insufficiency. He has recommended endovenous ablation of both the left small saphenous and the left great saphenous vein. This would still be scheduled later. 03/14/2015 -- she has heard back from the vascular office and has surgery scheduled for sometime in July. Her rheumatologist has decreased her prednisone dosage but she still on it. She has also had cataract surgery in her right eye recently this week. 03/20/2015 - No new complaints today. Pain improved. No fever or chills. Tolerating 2 layer compression. 04/14/2015 -- she was doing very well today she went off on vacation and now her edema has increased markedly the ulceration is bigger and her diabetes is not under control. 04/21/2015 -- I spoke to her PCP Dr. Candiss Norse and discussed the management which would include being seen by [name] general surgeon for debridement and taking multiple punch biopsies which would help in establishing the diagnosis of this is [name] vasculitis. She is agreeable about this and will set her up for the procedure with Dr. Tamala Julian at [facility]. She was seen by the surgeon Dr. Jamal Collin. 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 She is scheduled to have laser ablation done next week.Pt was sent here for possible biopsy to look for vasculitis. Feel it would be better to wait after laser ablation is completed- the ulcer may heal fully and biopsy may not be necessary 04/29/2015 -- she had the venous ablation done by Dr. Lucky Cowboy last Friday and we do not have any notes yet. She is doing fine otherwise. 05/06/2015  --Review of her recent vascular intervention shows that she was seen by Dr. Lucky Cowboy on 04/29/2015. The follow-up duplex which was done showed that both the great saphenous vein and the small saphenous vein remained patent with reflux consistent with an unsuccessful ablation. He has rescheduled her for another the endovenous ablation to be done in about 4 weeks time. 05/13/2015 -- he was seen by her surgeon Dr. Jamal Collin who asked her to continue with conservative therapy and he would speak to Dr. Lucky Cowboy about her management. Dr. Lucky Cowboy is going to schedule her surgery in the middle of August for [name] repeat endovenous ablation. Her pus culture from last week has grown : [place] her noted her sensitivity report but due to her multiple allergies I had tried clindamycin and she developed [name] rash with this too. She has been prescribed and anti-buttocks in the ER and is has it at home and she will let is know what she is going to be taking. 05/20/2015 -- she has developed [name] small spot on her right lower extremity but besides that it is not [name] full fledged ulceration. She did not get to see Dr. Lucky Cowboy last week and hopefully she will see him in the near future. 05/27/2015 -she is still awaiting her appointment with Dr.Dew and her vascular procedure is not scheduled until August 19. She will be seeing her PCP tomorrow and I have asked her to convey our discussion so that she is aware that debridement has not been done yet. VERLAINE, QUEENAN ([id number]) 06/03/2015 -- was seen by her rheumatologist Dr. Iona Beard  Cristi Loron, who has been treating her for temporal arteritis and in his note has mentioned the possibility of vasculitis or pyoderma gangrenosum. He is lowering her prednisone to 12-1/2 mg for 1 month and then 10 mg per the next month. I will again make an attempt to speak to her PCP Dr. Candiss Norse and her surgeon Dr. Lucky Cowboy  to see if he can organize for [name] debridement in the OR with multiple biopsies to establish [name] diagnosis of vasculitis or pyoderma gangrenosum. 06/17/2015 -- Dr.Dew did her vascular procedure last week and [name] follow-up venous ultrasound shows good resolution of the veins as per the patient's history. He is to see her back in 2 weeks. 07/07/2015. -- the patient has had [name] heavy growth of Proteus mirabilis and Enterococcus faecalis. These are sensitive to several drugs but the problem is she has allergies to all of these and hence I would like her to see Dr. Ola Spurr for this. She is also due to see Dr. Lucky Cowboy tomorrow and I will discussed the management with him including debridement under anesthesia and possible biopsies. 07/14/2015 -- she has an appointment to see Dr. Ola Spurr tomorrow and did see Dr. Bunnie Domino PA who will discuss my request with him. 07/21/2015 -- saw Dr. Ola Spurr was able to do [name] test on her and has put her on amoxicillin. She has been tolerating that and has had no problems with allergies to this. 07/28/2015 -- Last Friday I spoke to Dr. Leotis Pain regarding her care and he said that her right leg did not need any surgery and on the left leg was doing pretty good. We did agree that if she undergoes any procedure in the future he would do [name] couple of punch biopsies of the wound. 08/18/2015 -- her right leg is very tender and there is significant amount of slough. The left leg is looking much better 09/02/2015 -- she is going to have [name] debridement and punch biopsies of her right lower extremity by Dr. Leotis Pain this coming Thursday. Also seen Dr. Ola Spurr who has continued her on ciprofloxacin. 09/09/2015 -- on 09/04/2015 Dr. Leotis Pain took her to surgery - Irrigation and excisional debridement of skin, soft tissue, and muscle to about 40 cm2 to the right posterior calf with biopsy. Pathology results are -- DIAGNOSIS: SKIN, RIGHT LOWER EXTREMITY; BIOPSY: SKIN AND SOFT TISSUE WITH  ULCERATION, SEE NOTE. - NEGATIVE FOR DYSPLASIA AND MALIGNANCY. Note: There is acute inflammation and ulceration of the epidermis. The dermis shows nonspecific inflammation, neovascularization, and hemosiderin deposition. The differential diagnosis for these findings includes stasis dermatitis, nonspecific dermatitis, and infection. Correlation with clinical findings is required. [name] PAS fungal stain is obtained and results will be reported in an addendum. cultures were also sent and this grew Pseudomonas aeruginosa, Escherichia coli, Proteus mirabilis, Klebsiella pneumoniae and they were all sensitive to ciprofloxacin which she is on. 09/16/2015 -- he saw Dr. 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 He is going to call the patient in and discuss with her the management possibly with Imuran. I will continue treating her locally. 09/23/2015 -- she saw Dr. Ola Spurr today who was going to continue the antibiotics for now and stop after this course. She has an appointment to see Dr. Jefm Bryant in about [name] week's time. Her wound VAC has arrived but she did not bring it with her today. We will try and  set her up for changes to the right leg 3 times [name] week. She is here for her first application of Apligraf to the left lower extremity. RILY, MYERS ([id number]) 09/30/2015 -- she has seen Dr. Jefm Bryant little today and he has done [name] blood test and is awaiting the results before starting on treatment for pyoderma gangrenosum. because she is ambulatory at home health will not apply [name] wound VAC and she will have to come here 3 times [name] week on Monday Wednesday and Friday. 10/10/2015 -- she is here for [name] second application of Apligraf to her left lower extremity. 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 Jefm Bryant. Other than  that she is doing well 10/31/2015 -- she is here for [name] third application of Apligraf to her left lower extremity. after reviewing her wound on the right side I noted that it is granulating extremely well and we will use the remnants of the Apligraf on the right leg. 11/14/2015 -- she was recently admitted to the hospital between January 11 and 11/10/2015 for uncontrolled hypertension, diabetes mellitus, acute kidney injury. during her admission she was supported by wound care and also by antibiotics. Around this time her immunosuppression was stopped by Dr. Nunzio Cory. She is feeling much better now. 11/21/2015 -- she has had her fourth application of Apligraf today and it has been shared between the left and the right leg 11/24/2015 -- was here for [name] wound VAC change but it was noticed that she had profuse bleeding from the lobe medial part of her right lower extremity and I was called in to evaluate her. 11/28/2015 -- last week she saw the PA at the vascular office who injected her bleeding varicose veins with [name] sclerotherapeutic agent. She has been doing fine since then 12/19/2015 -- she has finished 5 applications of Apligraf to the left lower extremity and this is doing great. He began with pain and swelling of her right ankle [name] couple of days ago and it has caused her much distress. She does not have any fever or any change in her general health. 12/22/2015 -- the patient's pain and swelling of the right lower extremity has resolved over the weekend. The left lower extremity is completely healed. She has had the first application of Apligraf on the right leg today. 01/02/2016 -- She is here for the second application of Apligraf on the right leg today. 01/19/2016 -- she is here for the third application of Apligraf to the right posterior lateral leg. 02/02/2016 -- today she has had her fourth application of Apligraf and we have applied some of it to the right posterior lateral leg and the  remaining waist we have used for the left anterior medial leg. 02/12/2016 -- today she is use the fifth application of Apligraf to the right posterior lateral leg. The remaining to be wasted has been applied to the left anterior medial leg. Objective Constitutional Pulse regular. Respirations normal and unlabored. Afebrile. Vitals Time Taken: 1:56 PM, Height: 65 in, Weight: 248 lbs, BMI: 41.3, Temperature: 98.0 F, Pulse: 99 bpm, Respiratory Rate: 18 breaths/min, Blood Pressure: 191/76 mmHg. JUNELL, RAINIER D. ([id number]) Eyes Nonicteric. Reactive to light. Ears, Nose, Mouth, and Throat Lips, teeth, and gums WNL.Marland Kitchen Moist mucosa without lesions. Neck supple and nontender. No palpable supraclavicular or cervical adenopathy. Normal sized without goiter. Respiratory WNL. No retractions.. Cardiovascular Pedal Pulses WNL. No clubbing, cyanosis or edema. Lymphatic No adneopathy. No adenopathy. No adenopathy. Musculoskeletal  Adexa without tenderness or enlargement.. Digits and nails w/o clubbing, cyanosis, infection, petechiae, ischemia, or inflammatory conditions.Marland Kitchen Psychiatric Judgement and insight Intact.. No evidence of depression, anxiety, or agitation.. General Notes: the right lower extremity posterior lateral wound has some hyper granulation tissue and this has been cauterized with silver nitrate stick and then for the open areas have been covered with the Apligraf. The rest of the Apligraf was applied to the left anterior medial wound and both wounds were dressed with appropriate bolsters and Steri-Strips. Integumentary (Hair, Skin) No suspicious lesions. No crepitus or fluctuance. No peri-wound warmth or erythema. No masses.. Wound #5 status is Open. Original cause of wound was Gradually Appeared. The wound is located on the Right,Posterior Lower Leg. The wound measures 2.2cm length x 2.5cm width x 0.1cm depth; 4.32cm^2 area and 0.432cm^3 volume. The wound is limited to skin  breakdown. There is no tunneling or undermining noted. There is [name] medium amount of serosanguineous drainage noted. The wound margin is indistinct and nonvisible. There is large (67-100%) pink, pale granulation within the wound bed. There is [name] small (1-33%) amount of necrotic tissue within the wound bed including Adherent Slough. The periwound skin appearance exhibited: Localized Edema, Moist. The periwound skin appearance did not exhibit: Callus, Crepitus, Excoriation, Fluctuance, Friable, Induration, Rash, Scarring, Dry/Scaly, Maceration, Atrophie Blanche, Cyanosis, Ecchymosis, Hemosiderin Staining, Mottled, Pallor, Rubor, Erythema. Periwound temperature was noted as No Abnormality. Wound #6 status is Open. Original cause of wound was Gradually Appeared. The wound is located on the Right,Medial Lower Leg. The wound measures 5.5cm length x 3.2cm width x 0.1cm depth; 13.823cm^2 area and 1.382cm^3 volume. The wound is limited to skin breakdown. There is no tunneling or undermining noted. There is [name] small amount of serous drainage noted. The wound margin is flat and intact. There is large (67-100%) red granulation within the wound bed. There is [name] small (1-33%) amount of necrotic tissue within the wound bed including Eschar and Adherent Slough. The periwound skin appearance exhibited: XIOMARI, LIERA. ([id number]) Moist. The periwound skin appearance did not exhibit: Callus, Crepitus, Excoriation, Fluctuance, Friable, Induration, Localized Edema, Rash, Scarring, Dry/Scaly, Maceration, Atrophie Blanche, Cyanosis, Ecchymosis, Hemosiderin Staining, Mottled, Pallor, Rubor, Erythema. Periwound temperature was noted as No Abnormality. The periwound has tenderness on palpation. Assessment Active Problems ICD-10 E11.622 - Type 2 diabetes mellitus with other skin ulcer E66.01 - Morbid (severe) obesity due to excess calories I89.0 - Lymphedema, not elsewhere classified L97.212 - Non-pressure chronic ulcer  of right calf with fat layer exposed I87.311 - Chronic venous hypertension (idiopathic) with ulcer of right lower extremity Procedures Wound #5 Wound #5 is [name] Venous Leg Ulcer located on the Right,Posterior Lower Leg. [name] skin graft procedure using [name] bioengineered skin substitute/cellular or tissue based product was performed by [name], MD. Apligraf was applied and secured with Steri-Strips. 44 sq cm of product was utilized and 0 sq cm was wasted due to wound size. Post Application, meptiel one was applied. [name] Time Out was conducted prior to the start of the procedure. The procedure was tolerated well with [name] pain level of 0 throughout and [name] pain level of 0 following the procedure. Post procedure Diagnosis Wound #5: Same as Pre-Procedure General Notes: the remaining Apligraf was applied to the right posterior medial wound. Wound #6 Wound #6 is [name] Diabetic Wound/Ulcer of the Lower Extremity located on the Right,Medial Lower Leg. [name] skin graft procedure using [name] bioengineered skin substitute/cellular or tissue based product was performed by [name], MD. Apligraf was  applied and secured with Steri-Strips. 18 sq cm of product was utilized and 20 sq cm was wasted due to wound size. Post Application, meptiel one was applied. [name] Time Out was conducted prior to the start of the procedure. The procedure was tolerated well with [name] pain level of 0 throughout and [name] pain level of 0 following the procedure. Post procedure Diagnosis Wound #6: Same as Pre-Procedure . SHAHZODA, STREMEL ([id number]) Plan Wound Cleansing: Wound #5 Right,Posterior Lower Leg: Cleanse wound with mild soap and water Wound #6 Right,Medial Lower Leg: Cleanse wound with mild soap and water Anesthetic: Wound #5 Right,Posterior Lower Leg: Topical Lidocaine 4% cream applied to wound bed prior to debridement Wound #6 Right,Medial Lower Leg: Topical Lidocaine 4% cream applied to wound bed prior to debridement Skin  Barriers/Peri-Wound Care: Wound #5 Right,Posterior Lower Leg: Skin Prep Wound #6 Right,Medial Lower Leg: Skin Prep Primary Wound Dressing: Wound #5 Right,Posterior Lower Leg: Dry Gauze Boardered Foam Dressing Drawtex Mepitel One - over Apligraf Wound #6 Right,Medial Lower Leg: Dry Gauze Boardered Foam Dressing Drawtex Mepitel One - over Apligraf Dressing Change Frequency: Wound #5 Right,Posterior Lower Leg: Change dressing every week Wound #6 Right,Medial Lower Leg: Change dressing every week Follow-up Appointments: Wound #5 Right,Posterior Lower Leg: Return Appointment in 1 week. Wound #6 Right,Medial Lower Leg: Return Appointment in 1 week. Edema Control: Wound #5 Right,Posterior Lower Leg: Elevate legs to the level of the heart and pump ankles as often as possible Other: - wear compression hose Wound #6 Right,Medial Lower Leg: Elevate legs to the level of the heart and pump ankles as often as possible Other: - wear compression hose Advanced Therapies: Wound #5 Right,Posterior Lower Leg: Apligraf application in clinic; including contact layer, fixation with steri strips, dry gauze and cover dressing. Wound #6 Right,Medial Lower Leg: Apligraf application in clinic; including contact layer, fixation with steri strips, dry gauze and cover dressing. JOHNAE, IANNONE ([id number]) With the usual precautions the fifth Apligraf was applied today and bolstered in place. Part of the wound has hyper-granulated but the anterior wound is beginning to look very clean and the rest of the Apligraf was applied on this wound. She will come for [name] wound check next week. Electronic Signature(s) Signed: 02/16/2016 2:51:20 PM By: [name], [name] Entered By: [name] on 02/16/2016 14:51:20 Landfair, Lanore D. ([id number]) -------------------------------------------------------------------------------- SuperBill Details Patient Name: OLEEN, OKOLI D. Date of Service: 02/16/2016 Medical  Record Number: [id number] Patient Account Number: [id number] Date of Birth/Sex: 1946-05-19 (70 y.o. Female) Treating RN: [name] Primary Care Physician: Glendon Axe Other Clinician: Referring Physician: Glendon Axe Treating Physician/Extender: Frann Rider in Treatment: 54 Diagnosis Coding ICD-10 Codes Code Description E11.622 Type 2 diabetes mellitus with other skin ulcer E66.01 Morbid (severe) obesity due to excess calories I89.0 Lymphedema, not elsewhere classified L97.212 Non-pressure chronic ulcer of right calf with fat layer exposed I87.311 Chronic venous hypertension (idiopathic) with ulcer of right lower extremity Facility Procedures CPT4 Code Description: [id number] (Facility Use Only) Apligraf 1 SQ CM Modifier: Quantity: 82 CPT4 Code Description: [id number] - SKIN SUB GRAFT FACE/NK/HF/G ICD-10 Description Diagnosis E11.622 Type 2 diabetes mellitus with other skin ulcer I89.0 Lymphedema, not elsewhere classified L97.212 Non-pressure chronic ulcer of right calf with fat  la I87.311 Chronic venous hypertension (idiopathic) with ulcer Modifier: yer exposed of right lowe Quantity: 1 r extremity CPT4 Code Description: [id number] - SKIN SUB GRAFT F/N/HF/G ADDL ICD-10 Description Diagnosis E11.622 Type 2 diabetes mellitus with other skin ulcer I89.0 Lymphedema, not  elsewhere classified I87.311 Chronic venous hypertension (idiopathic) with  ulcer L97.212 Non-pressure chronic ulcer of right calf with fat la Modifier: of right lowe yer exposed Quantity: 2 r extremity Physician Procedures CPT4 Code Description: [id number] - WC PHYS SKIN SUB GRAFT FACE/NK/HF/G ICD-10 Description Diagnosis E11.622 Type 2 diabetes mellitus with other skin ulcer Mendonca, TASHANIQUE NOVICKI. ([id number]) Modifier: Quantity: 1 Electronic Signature(s) Signed: 02/16/2016 2:52:54 PM By: [name], [name] Entered By: [name] on 02/16/2016 14:52:53

## 2016-02-22 DIAGNOSIS — F4321 Adjustment disorder with depressed mood: Secondary | ICD-10-CM | POA: Insufficient documentation

## 2016-02-23 ENCOUNTER — Encounter: Payer: Commercial Managed Care - HMO | Attending: Surgery | Admitting: Surgery

## 2016-02-23 DIAGNOSIS — M1990 Unspecified osteoarthritis, unspecified site: Secondary | ICD-10-CM | POA: Insufficient documentation

## 2016-02-23 DIAGNOSIS — L97212 Non-pressure chronic ulcer of right calf with fat layer exposed: Secondary | ICD-10-CM | POA: Insufficient documentation

## 2016-02-23 DIAGNOSIS — I2510 Atherosclerotic heart disease of native coronary artery without angina pectoris: Secondary | ICD-10-CM | POA: Insufficient documentation

## 2016-02-23 DIAGNOSIS — E7800 Pure hypercholesterolemia, unspecified: Secondary | ICD-10-CM | POA: Diagnosis not present

## 2016-02-23 DIAGNOSIS — M316 Other giant cell arteritis: Secondary | ICD-10-CM | POA: Diagnosis not present

## 2016-02-23 DIAGNOSIS — I87311 Chronic venous hypertension (idiopathic) with ulcer of right lower extremity: Secondary | ICD-10-CM | POA: Diagnosis not present

## 2016-02-23 DIAGNOSIS — J45909 Unspecified asthma, uncomplicated: Secondary | ICD-10-CM | POA: Diagnosis not present

## 2016-02-23 DIAGNOSIS — Z90710 Acquired absence of both cervix and uterus: Secondary | ICD-10-CM | POA: Diagnosis not present

## 2016-02-23 DIAGNOSIS — I129 Hypertensive chronic kidney disease with stage 1 through stage 4 chronic kidney disease, or unspecified chronic kidney disease: Secondary | ICD-10-CM | POA: Diagnosis not present

## 2016-02-23 DIAGNOSIS — E1140 Type 2 diabetes mellitus with diabetic neuropathy, unspecified: Secondary | ICD-10-CM | POA: Insufficient documentation

## 2016-02-23 DIAGNOSIS — Z6841 Body Mass Index (BMI) 40.0 and over, adult: Secondary | ICD-10-CM | POA: Diagnosis not present

## 2016-02-23 DIAGNOSIS — N189 Chronic kidney disease, unspecified: Secondary | ICD-10-CM | POA: Diagnosis not present

## 2016-02-23 DIAGNOSIS — I890 Lymphedema, not elsewhere classified: Secondary | ICD-10-CM | POA: Insufficient documentation

## 2016-02-23 DIAGNOSIS — E11622 Type 2 diabetes mellitus with other skin ulcer: Secondary | ICD-10-CM | POA: Diagnosis present

## 2016-02-23 NOTE — Progress Notes (Signed)
SHANTEA, LUDOVICO ([id number]) Visit Report for 02/23/2016 Arrival Information Details Patient Name: BRAYLYNN, HENDERSON. Date of Service: 02/23/2016 1:30 PM Medical Record Number: [id number] Patient Account Number: [id number] Date of Birth/Sex: [birth year] (70 y.o. Female) Treating RN: [name] Primary Care Physician: Glendon Axe Other Clinician: Referring Physician: Glendon Axe Treating Physician/Extender: Frann Rider in Treatment: 58 Visit Information History Since Last Visit Added or deleted any medications: No Patient Arrived: Ambulatory Any new allergies or adverse reactions: No Arrival Time: 13:50 Had a fall or experienced change in No Accompanied By: self activities of daily living that may affect Transfer Assistance: None risk of falls: Patient Requires Transmission- No Signs or symptoms of abuse/neglect since last No Based Precautions: visito Patient Has Alerts: Yes Hospitalized since last visit: No Patient Alerts: Patient on Blood Pain Present Now: No Thinner Electronic Signature(s) Signed: 02/23/2016 4:21:47 PM By: [name] Entered By: [name] on 02/23/2016 13:51:34 Tench, Tekela D. ([id number]) -------------------------------------------------------------------------------- Clinic Level of Care Assessment Details Patient Name: [name] Date of Service: 02/23/2016 1:30 PM Medical Record Number: [id number] Patient Account Number: [id number] Date of Birth/Sex: 1946-09-12 (70 y.o. Female) Treating RN: Macarthur Critchley Primary Care Physician: Glendon Axe Other Clinician: Referring Physician: Glendon Axe Treating Physician/Extender: Frann Rider in Treatment: 57 Clinic Level of Care Assessment Items TOOL 4 Quantity Score X - Use when only an EandM is performed on FOLLOW-UP visit 1 0 ASSESSMENTS - Nursing Assessment / Reassessment X - Reassessment of Co-morbidities (includes updates in patient status) 1 10 X - Reassessment of  Adherence to Treatment Plan 1 5 ASSESSMENTS - Wound and Skin Assessment / Reassessment []  - Simple Wound Assessment / Reassessment - one wound 0 X - Complex Wound Assessment / Reassessment - multiple wounds 2 5 []  - Dermatologic / Skin Assessment (not related to wound area) 0 ASSESSMENTS - Focused Assessment X - Circumferential Edema Measurements - multi extremities 1 5 []  - Nutritional Assessment / Counseling / Intervention 0 X - Lower Extremity Assessment (monofilament, tuning fork, pulses) 1 5 []  - Peripheral Arterial Disease Assessment (using hand held doppler) 0 ASSESSMENTS - Ostomy and/or Continence Assessment and Care []  - Incontinence Assessment and Management 0 []  - Ostomy Care Assessment and Management (repouching, etc.) 0 PROCESS - Coordination of Care X - Simple Patient / Family Education for ongoing care 1 15 []  - Complex (extensive) Patient / Family Education for ongoing care 0 X - Staff obtains Programmer, systems, Records, Test Results / Process Orders 1 10 []  - Staff telephones HHA, Nursing Homes / Clarify orders / etc 0 []  - Routine Transfer to another Facility (non-emergent condition) 0 Pacholski, Yeraldy D. ([id number]) []  - Routine Hospital Admission (non-emergent condition) 0 []  - New Admissions / Biomedical engineer / Ordering NPWT, Apligraf, etc. 0 []  - Emergency Hospital Admission (emergent condition) 0 X - Simple Discharge Coordination 1 10 []  - Complex (extensive) Discharge Coordination 0 PROCESS - Special Needs []  - Pediatric / Minor Patient Management 0 []  - Isolation Patient Management 0 []  - Hearing / Language / Visual special needs 0 []  - Assessment of Community assistance (transportation, D/C planning, etc.) 0 []  - Additional assistance / Altered mentation 0 []  - Support Surface(s) Assessment (bed, cushion, seat, etc.) 0 INTERVENTIONS - Wound Cleansing / Measurement []  - Simple Wound Cleansing - one wound 0 X - Complex Wound Cleansing - multiple wounds 2 5 X -  Wound Imaging (photographs - any number of wounds) 1 5 []  - Wound Tracing (instead of photographs) 0 []  -  Simple Wound Measurement - one wound 0 X - Complex Wound Measurement - multiple wounds 2 5 INTERVENTIONS - Wound Dressings X - Small Wound Dressing one or multiple wounds 2 10 []  - Medium Wound Dressing one or multiple wounds 0 []  - Large Wound Dressing one or multiple wounds 0 []  - Application of Medications - topical 0 []  - Application of Medications - injection 0 INTERVENTIONS - Miscellaneous []  - External ear exam 0 Moist, Teniqua D. ([id number]) []  - Specimen Collection (cultures, biopsies, blood, body fluids, etc.) 0 []  - Specimen(s) / Culture(s) sent or taken to Lab for analysis 0 []  - Patient Transfer (multiple staff / [name]) 0 []  - Simple Staple / Suture removal (25 or less) 0 []  - Complex Staple / Suture removal (26 or more) 0 []  - Hypo / Hyperglycemic Management (close monitor of Blood Glucose) 0 []  - Ankle / Brachial Index (ABI) - do not check if billed separately 0 X - Vital Signs 1 5 Has the patient been seen at the hospital within the last three years: Yes Total Score: 120 Level Of Care: New/Established - Level 4 Electronic Signature(s) Signed: 02/23/2016 4:34:19 PM By: Rebecca Eaton, RN, [name] Entered By: Rebecca Eaton RN, [name] on 02/23/2016 14:28:17 Nole, Tia Masker ([id number]) -------------------------------------------------------------------------------- Encounter Discharge Information Details Patient Name: [name] Date of Service: 02/23/2016 1:30 PM Medical Record Number: [id number] Patient Account Number: [id number] Date of Birth/Sex: [birth year] (70 y.o. Female) Treating RN: [name] Primary Care Physician: Glendon Axe Other Clinician: Referring Physician: Glendon Axe Treating Physician/Extender: Frann Rider in Treatment: 49 Encounter Discharge Information Items Discharge Pain Level: 0 Discharge Condition:  Stable Ambulatory Status: Ambulatory Discharge Destination: Home Transportation: Private Auto Accompanied By: self Schedule Follow-up Appointment: Yes Medication Reconciliation completed and provided to Patient/Care No Codi Folkerts: Provided on Clinical Summary of Care: 02/23/2016 Form Type Recipient Paper Patient LM Electronic Signature(s) Signed: 02/23/2016 3:23:29 PM By: [name] Previous Signature: 02/23/2016 2:24:11 PM Version By: Ruthine Dose Entered By: [name] on 02/23/2016 15:23:29 Shon, Kiira D. ([id number]) -------------------------------------------------------------------------------- Lower Extremity Assessment Details Patient Name: Claude, Mason D. Date of Service: 02/23/2016 1:30 PM Medical Record Number: [id number] Patient Account Number: [id number] Date of Birth/Sex: 1946-05-14 (70 y.o. Female) Treating RN: [name] Primary Care Physician: Glendon Axe Other Clinician: Referring Physician: Glendon Axe Treating Physician/Extender: Frann Rider in Treatment: 55 Edema Assessment Assessed: [Left: No] [Right: No] Edema: [Left: Ye] [Right: s] Vascular Assessment Pulses: Posterior Tibial Dorsalis Pedis Palpable: [Right:Yes] Extremity colors, hair growth, and conditions: Extremity Color: [Right:Normal] Hair Growth on Extremity: [Right:No] Temperature of Extremity: [Right:Warm] Capillary Refill: [Right:< 3 seconds] Electronic Signature(s) Signed: 02/23/2016 4:21:47 PM By: [name] Entered By: [name] on 02/23/2016 14:02:31 Glasner, Jermiyah D. ([id number]) -------------------------------------------------------------------------------- Patient/Caregiver Education Details Patient Name: [name] Date of Service: 02/23/2016 1:30 PM Medical Record Number: [id number] Patient Account Number: [id number] Date of Birth/Gender: 1945/12/15 (70 y.o. Female) Treating RN: [name] Primary Care Physician: Glendon Axe Other  Clinician: Referring Physician: Glendon Axe Treating Physician/Extender: Frann Rider in Treatment: 1 Education Assessment Education Provided To: Patient Education Topics Provided Wound/Skin Impairment: Handouts: Other: wound care if needed this week Methods: Demonstration, Explain/Verbal Responses: State content correctly Electronic Signature(s) Signed: 02/23/2016 3:23:51 PM By: [name] Entered By: [name] on 02/23/2016 15:23:51 Ernest, Jenasia D. ([id number]) -------------------------------------------------------------------------------- Wound Assessment Details Patient Name: Govan, Pansey D. Date of Service: 02/23/2016 1:30 PM Medical Record Number: [id number] Patient Account Number: [id number] Date of Birth/Sex: 06-24-1946 (70 y.o. Female)  Treating RN: [name] Primary Care Physician: Glendon Axe Other Clinician: Referring Physician: Glendon Axe Treating Physician/Extender: Frann Rider in Treatment: 80 Wound Status Wound Number: 5 Primary Venous Leg Ulcer Etiology: Wound Location: Right Lower Leg - Posterior Wound Open Wounding Event: Gradually Appeared Status: Date Acquired: 04/29/2015 Comorbid Cataracts, Asthma, Coronary Artery Weeks Of Treatment: 39 History: Disease, Hypertension, Type II Clustered Wound: No Diabetes, Osteoarthritis, Neuropathy Photos Photo Uploaded By: [name] on 02/23/2016 15:24:29 Wound Measurements Length: (cm) 2.2 Width: (cm) 2.5 Depth: (cm) 0.1 Area: (cm) 4.32 Volume: (cm) 0.432 % Reduction in Area: -2197.9% % Reduction in Volume: -2173.7% Epithelialization: None Tunneling: No Undermining: No Wound Description Full Thickness Without Exposed Foul Odor A Classification: Support Structures Diabetic Severity Grade 2 (Wagner): Wound Margin: Indistinct, nonvisible Exudate Amount: Medium Exudate Type: Serosanguineous Exudate Color: red, brown fter Cleansing: No Wound Bed Granulation  Amount: Large (67-100%) Exposed Structure Raider, Mychaela D. ([id number]) Granulation Quality: Pink, Pale Fascia Exposed: No Necrotic Amount: Small (1-33%) Fat Layer Exposed: No Necrotic Quality: Adherent Slough Tendon Exposed: No Muscle Exposed: No Joint Exposed: No Bone Exposed: No Limited to Skin Breakdown Periwound Skin Texture Texture Color No Abnormalities Noted: No No Abnormalities Noted: No Callus: No Atrophie Blanche: No Crepitus: No Cyanosis: No Excoriation: No Ecchymosis: No Fluctuance: No Erythema: No Friable: No Hemosiderin Staining: No Induration: No Mottled: No Localized Edema: Yes Pallor: No Rash: No Rubor: No Scarring: No Temperature / Pain Moisture Temperature: No Abnormality No Abnormalities Noted: No Dry / Scaly: No Maceration: No Moist: Yes Wound Preparation Ulcer Cleansing: Rinsed/Irrigated with Saline Topical Anesthetic Applied: Other: lidocaine 4%, Treatment Notes Wound #5 (Right, Posterior Lower Leg) 1. Cleansed with: Clean wound with Normal Saline 4. Dressing Applied: Other dressing (specify in notes) 5. Secondary Dressing Applied Bordered Foam Dressing Notes carboflex, drawtex Electronic Signature(s) Signed: 02/23/2016 4:21:47 PM By: [name] Entered By: [name] on 02/23/2016 14:01:37 Pat, Keysha D. ([id number]) -------------------------------------------------------------------------------- Wound Assessment Details Patient Name: Stankiewicz, Shirle D. Date of Service: 02/23/2016 1:30 PM Medical Record Number: [id number] Patient Account Number: [id number] Date of Birth/Sex: 04/22/1946 (70 y.o. Female) Treating RN: [name] Primary Care Physician: Glendon Axe Other Clinician: Referring Physician: Glendon Axe Treating Physician/Extender: Frann Rider in Treatment: 51 Wound Status Wound Number: 6 Primary Diabetic Wound/Ulcer of the Lower Etiology: Extremity Wound Location: Right Lower Leg - Medial Wound  Open Wounding Event: Gradually Appeared Status: Date Acquired: 10/10/2015 Comorbid Cataracts, Asthma, Coronary Artery Weeks Of Treatment: 19 History: Disease, Hypertension, Type II Clustered Wound: No Diabetes, Osteoarthritis, Neuropathy Photos Photo Uploaded By: [name] on 02/23/2016 15:24:29 Wound Measurements Length: (cm) 5.5 Width: (cm) 3.2 Depth: (cm) 0.1 Area: (cm) 13.823 Volume: (cm) 1.382 % Reduction in Area: -300% % Reduction in Volume: -299.4% Epithelialization: None Tunneling: No Undermining: No Wound Description Classification: Grade 1 Foul Odor After Wound Margin: Flat and Intact Exudate Amount: Small Exudate Type: Serous Exudate Color: amber Cleansing: No Wound Bed Granulation Amount: Large (67-100%) Exposed Structure Granulation Quality: Red Fascia Exposed: No Necrotic Amount: Small (1-33%) Fat Layer Exposed: No Necrotic Quality: Eschar, Adherent Slough Tendon Exposed: No Kanitz, Kadiatou D. ([id number]) Muscle Exposed: No Joint Exposed: No Bone Exposed: No Limited to Skin Breakdown Periwound Skin Texture Texture Color No Abnormalities Noted: No No Abnormalities Noted: No Callus: No Atrophie Blanche: No Crepitus: No Cyanosis: No Excoriation: No Ecchymosis: No Fluctuance: No Erythema: No Friable: No Hemosiderin Staining: No Induration: No Mottled: No Localized Edema: No Pallor: No Rash: No Rubor: No Scarring: No Temperature / Pain  Moisture Temperature: No Abnormality No Abnormalities Noted: No Tenderness on Palpation: Yes Dry / Scaly: No Maceration: No Moist: Yes Wound Preparation Ulcer Cleansing: Rinsed/Irrigated with Saline Topical Anesthetic Applied: Other: lidocaine 4%, Treatment Notes Wound #6 (Right, Medial Lower Leg) 1. Cleansed with: Clean wound with Normal Saline 4. Dressing Applied: Other dressing (specify in notes) 5. Secondary Dressing Applied Bordered Foam Dressing Notes carboflex, drawtex Electronic  Signature(s) Signed: 02/23/2016 4:21:47 PM By: [name] Entered By: [name] on 02/23/2016 14:02:13 Hallett, Zamiah D. ([id number]) -------------------------------------------------------------------------------- Vitals Details Patient Name: [name] Date of Service: 02/23/2016 1:30 PM Medical Record Number: [id number] Patient Account Number: [id number] Date of Birth/Sex: 1946/09/01 (70 y.o. Female) Treating RN: [name] Primary Care Physician: Glendon Axe Other Clinician: Referring Physician: Glendon Axe Treating Physician/Extender: Frann Rider in Treatment: 55 Vital Signs Time Taken: 13:51 Temperature (F): 98.1 Height (in): 65 Pulse (bpm): 88 Weight (lbs): 248 Respiratory Rate (breaths/min): 18 Body Mass Index (BMI): 41.3 Blood Pressure (mmHg): 172/64 Reference Range: 80 - 120 mg / dl Electronic Signature(s) Signed: 02/23/2016 4:21:47 PM By: [name] Entered By: [name] on 02/23/2016 13:52:31

## 2016-02-23 NOTE — Progress Notes (Signed)
TIEASHA, CARCHI ([id number]) Visit Report for 02/23/2016 Chief Complaint Document Details Patient Name: [name], [name] Date of Service: 02/23/2016 1:30 PM Medical Record Number: [id number] Patient Account Number: [id number] Date of Birth/Sex: 08/08/1946 (70 y.o. Female) Treating RN: [name] Primary Care Physician: Glendon Axe Other Clinician: Referring Physician: Glendon Axe Treating Physician/Extender: Frann Rider in Treatment: 49 Information Obtained from: Patient Chief Complaint Patient presents to the wound care center for a consult due non healing wound 70 year old patient comes with a history of having a ulcer on the left lower extremity for the past 4 weeks. she says she's had swelling of both lower extremities for about a year after she started having prednisone. 02/07/2015 -- her vascular appointments obtained were in the first and third week of June. she is able to go to [place] and we will try and get her some earlier appointments. Other than that nothing else has changed in her management. Electronic Signature(s) Signed: 02/23/2016 2:21:36 PM By: [name], [name] Entered By: [name] on 02/23/2016 14:21:36 [name] ([id number]) -------------------------------------------------------------------------------- HPI Details Patient Name: Kristin, [name] Date of Service: 02/23/2016 1:30 PM Medical Record Number: [id number] Patient Account Number: [id number] Date of Birth/Sex: 1946/03/11 (70 y.o. Female) Treating RN: [name] Primary Care Physician: Glendon Axe Other Clinician: Referring Physician: Glendon Axe Treating Physician/Extender: Frann Rider in Treatment: 31 History of Present Illness HPI Description: 70 year old patient who is known to have diabetes mellitus type 2, chronic renal insufficiency, coronary artery disease, hypertension, hypercholesterolemia, temporal arteritis and inflammatory arthritiss also has a  history of having a hysterectomy and some orthopedic related surgeries. The ulcer on the left lower extremity started off as a blister and then. Got progressively worse. She does not have any fever or chills and has not had any recent surgical intervention for this. Her last hemoglobin A1c was 10.1 in September 2015. She has been recently put on doxycycline by her PCP. She is now also allergic to doxycycline and was this was changed over to Keflex. 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 She does see a cardiologist and also takes a diuretic. 02/07/2015 her arterial and venous duplex studies to be done have dates been given as the first and third week of June. This is at [facility]. We are going to try and get early appointments at [facility]. other than that nothing has changed in her management. 02/14/2015 -- we have been able to get her an appointment in [facility] on May 20 which is much earlier than her previous ones at [place]. She continues with her prednisone and her sugars are in the range of 150-200. 02/21/2015 We were able to get a vascular lab workup for her today and she is going to be there at 2:00 this afternoon. the swelling of her leg has gone down significantly but she still has some tenderness over the wounds. 02/28/2015 - She has had one of two vascular workups done, and this coming Tuesday has another, at [place] [facility]. She continues to be on steroid medications. She has significant sensitivity in her left lower extremity and has pain suggestive of neuropathic pain and I have asked her to address this with her primary care physician. 03/07/2015 -- The patient saw Dr. Lucky Cowboy for a consultation and he has had her arteries are okay but she has 2 incompetent veins on the  left lower extremity and he is going to set her up for surgery. Official  report is awaited. Addendum: Official reports are now available and on 03/04/2015. She was seen and lower extremity venous duplex exam was done. There was reflux present within the left greater saphenous vein below the knee and also the left small saphenous vein. Arterial duplex showed normal triphasic waveforms throughout the left lower extremity without any significant stenosis. Her ABIs were noncompressible bilaterally but a waveforms were normal and a digital pressures were normal bilaterally consistent with no significant arterial insufficiency. He has recommended endovenous ablation of both the left small saphenous and the left great saphenous vein. This would still be scheduled later. 03/14/2015 -- she has heard back from the vascular office and has surgery scheduled for sometime in July. Her rheumatologist has decreased her prednisone dosage but she still on it. She has also had cataract surgery in her right eye recently this week. [name], [name] ([id number]) 03/20/2015 - No new complaints today. Pain improved. No fever or chills. Tolerating 2 layer compression. 04/14/2015 -- she was doing very well today she went off on vacation and now her edema has increased markedly the ulceration is bigger and her diabetes is not under control. 04/21/2015 -- I spoke to her PCP Dr. Candiss Norse and discussed the management which would include being seen by a general surgeon for debridement and taking multiple punch biopsies which would help in establishing the diagnosis of this is a vasculitis. She is agreeable about this and will set her up for the procedure with Dr. Tamala Julian at [facility]. She was seen by the surgeon Dr. Jamal Collin. 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 She is scheduled to have laser ablation done next week.Pt was sent here for possible biopsy to look for vasculitis.  Feel it would be better to wait after laser ablation is completed- the ulcer may heal fully and biopsy may not be necessary 04/29/2015 -- she had the venous ablation done by Dr. Lucky Cowboy last Friday and we do not have any notes yet. She is doing fine otherwise. 05/06/2015 --Review of her recent vascular intervention shows that she was seen by Dr. Lucky Cowboy on 04/29/2015. The follow-up duplex which was done showed that both the great saphenous vein and the small saphenous vein remained patent with reflux consistent with an unsuccessful ablation. He has rescheduled her for another the endovenous ablation to be done in about 4 weekso time. 05/13/2015 -- he was seen by her surgeon Dr. Jamal Collin who asked her to continue with conservative therapy and he would speak to Dr. Lucky Cowboy about her management. Dr. Lucky Cowboy is going to schedule her surgery in the middle of August for a repeat endovenous ablation. Her pus culture from last week has grown : [place] her noted her sensitivity report but due to her multiple allergies I had tried clindamycin and she developed a rash with this too. She has been prescribed and anti-buttocks in the ER and is has it at home and she will let is know what she is going to be taking. 05/20/2015 -- she has developed a small spot on her right lower extremity but besides that it is not a full fledged ulceration. She did not get to see Dr. Lucky Cowboy last week and hopefully she will see him in the near future. 05/27/2015 -she is still awaiting her  appointment with Dr.Dew and her vascular procedure is not scheduled until August 19. She will be seeing her PCP tomorrow and I have asked her to convey our discussion so that she is aware that debridement has not been done yet. 06/03/2015 -- was seen by her rheumatologist Dr. Dorthula Matas, who has been treating her for temporal arteritis and in his note  has mentioned the possibility of vasculitis or pyoderma gangrenosum. He is lowering her prednisone to 12-1/2 mg for 1 month and then 10 mg per the next month. I will again make an attempt to speak to her PCP Dr. Candiss Norse and her surgeon Dr. 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 06/17/2015 -- Dr.Dew did her vascular procedure last week and a follow-up venous ultrasound shows good resolution of the veins as per the patient's history. He is to see her back in 2 weeks. 07/07/2015. -- the patient has had a heavy growth of Proteus mirabilis and Enterococcus faecalis. These Mudry, Icy [name] ([id number]) are sensitive to several drugs but the problem is she has allergies to all of these and hence I would like her to see Dr. Ola Spurr for this. She is also due to see Dr. Lucky Cowboy tomorrow and I will discussed the management with him including debridement under anesthesia and possible biopsies. 07/14/2015 -- she has an appointment to see Dr. Ola Spurr tomorrow and did see Dr. Bunnie Domino PA who will discuss my request with him. 07/21/2015 -- saw Dr. Ola Spurr was able to do a test on her and has put her on amoxicillin. She has been tolerating that and has had no problems with allergies to this. 07/28/2015 -- Last Friday I spoke to Dr. Leotis Pain regarding her care and he said that her right leg did not need any surgery and on the left leg was doing pretty good. We did agree that if she undergoes any procedure in the future he would do a couple of punch biopsies of the wound. 08/18/2015 -- her right leg is very tender and there is significant amount of slough. The left leg is looking much better 09/02/2015 -- she is going to have a debridement and punch biopsies of her right lower extremity by Dr. Leotis Pain this coming Thursday. Also seen Dr. Ola Spurr who has continued her on ciprofloxacin. 09/09/2015 -- on  09/04/2015 Dr. Leotis Pain took her to surgery - Irrigation and excisional debridement of skin, soft tissue, and muscle to about 40 cm2 to the right posterior calf with biopsy. Pathology results are -- DIAGNOSIS: SKIN, RIGHT LOWER EXTREMITY; BIOPSY: SKIN AND SOFT TISSUE WITH ULCERATION, SEE NOTE. - NEGATIVE FOR DYSPLASIA AND MALIGNANCY. Note: There is acute inflammation and ulceration of the epidermis. The dermis shows nonspecific inflammation, neovascularization, and hemosiderin deposition. The differential diagnosis for these findings includes stasis dermatitis, nonspecific dermatitis, and infection. Correlation with clinical findings is required. A PAS fungal stain is obtained and results will be reported in an addendum. cultures were also sent and this grew Pseudomonas aeruginosa, Escherichia coli, Proteus mirabilis, Klebsiella pneumoniae and they were all sensitive to ciprofloxacin which she is on. 09/16/2015 -- he saw Dr. 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 He is going to call the patient in and discuss with her the management possibly with Imuran. I will continue treating  her locally. 09/23/2015 -- she saw Dr. Ola Spurr today who was going to continue the antibiotics for now and stop after this course. She has an appointment to see Dr. Jefm Bryant in about a week's time. Her wound VAC has arrived but she did not bring it with her today. We will try and set her up for changes to the right leg 3 times a week. She is here for her first application of Apligraf to the left lower extremity. 09/30/2015 -- she has seen Dr. 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 because she is ambulatory at home health will not apply a wound VAC and she will have to come here 3 times a week on  Monday Wednesday and Friday. 10/10/2015 -- she is here for a second application of Apligraf to her left lower extremity. 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 Jefm Bryant. Other than that she is doing well 10/31/2015 -- she is here for a third application of Apligraf to her left lower extremity. after reviewing her wound on the right side I noted that it is granulating extremely well and we will use the remnants of the Apligraf on the right leg. 11/14/2015 -- she was recently admitted to the hospital between January 11 and 11/10/2015 for Horlacher, Doraine [name] ([id number]) uncontrolled hypertension, diabetes mellitus, acute kidney injury. during her admission she was supported by wound care and also by antibiotics. Around this time her immunosuppression was stopped by Dr. Nunzio Cory. She is feeling much better now. 11/21/2015 -- she has had her fourth application of Apligraf today and it has been shared between the left and the right leg 11/24/2015 -- was here for a wound VAC change but it was noticed that she had profuse bleeding from the lobe medial part of her right lower extremity and I was called in to evaluate her. 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 She has been doing fine since then 12/19/2015 -- she has finished 5 applications of Apligraf to the left lower extremity and this is doing great. He began with pain and swelling of her right ankle a couple of days ago and it has caused her much distress. She does not have any fever or any change in her general health. 12/22/2015 -- the patient's pain and swelling of the right lower extremity has resolved over the weekend. The left lower extremity is completely healed. She has had the first application of Apligraf on the right leg today. 01/02/2016 -- She is here for the second application  of Apligraf on the right leg today. 01/19/2016 -- she is here for the third application of Apligraf to the right posterior lateral leg. 02/02/2016 -- today she has had her fourth application of Apligraf and we have applied some of it to the right posterior lateral leg and the remaining waist we have used for the left anterior medial leg. 02/12/2016 -- today she is use the fifth application of Apligraf to the right posterior lateral leg. The remaining to be wasted has been applied to the left anterior medial leg. Electronic Signature(s) Signed: 02/23/2016 2:21:51 PM By: [name], [name] Entered By: [name] on 02/23/2016 14:21:51 [name], [name] Kitchen ([id number]) -------------------------------------------------------------------------------- Physical Exam Details Patient Name: MAIRLYN, PUCCINELLI [name] Date of Service: 02/23/2016 1:30 PM Medical Record Number:  [id number] Patient Account Number: [id number] Date of Birth/Sex: 1946-08-03 (70 y.o. Female) Treating RN: [name] Primary Care Physician: Glendon Axe Other Clinician: Referring Physician: Glendon Axe Treating Physician/Extender: Frann Rider in Treatment: 55 Constitutional . Pulse regular. Respirations normal and unlabored. Afebrile. . Eyes Nonicteric. Reactive to light. Ears, Nose, Mouth, and Throat Lips, teeth, and gums WNL.Marland Kitchen Moist mucosa without lesions. Neck supple and nontender. No palpable supraclavicular or cervical adenopathy. Normal sized without goiter. Respiratory WNL. No retractions.. Cardiovascular Pedal Pulses WNL. No clubbing, cyanosis or edema. Lymphatic No adneopathy. No adenopathy. No adenopathy. Musculoskeletal Adexa without tenderness or enlargement.. Digits and nails w/o clubbing, cyanosis, infection, petechiae, ischemia, or inflammatory conditions.. Integumentary (Hair, Skin) No suspicious lesions. No crepitus or fluctuance. No peri-wound warmth or erythema. No  masses.Marland Kitchen Psychiatric Judgement and insight Intact.. No evidence of depression, anxiety, or agitation.. Notes the right lower extremity wound has quite a bit of significant drainage which has a order due to the fact that it was not changed all week. The Apligraf is intact. Electronic Signature(s) Signed: 02/23/2016 2:23:18 PM By: [name], [name] Entered By: [name] on 02/23/2016 14:23:17 [name], [name] Kitchen ([id number]) -------------------------------------------------------------------------------- Physician Orders Details Patient Name: [name], [name] Date of Service: 02/23/2016 1:30 PM Medical Record Number: [id number] Patient Account Number: [id number] Date of Birth/Sex: 1946/03/12 (70 y.o. Female) Treating RN: Macarthur Critchley Primary Care Physician: Glendon Axe Other Clinician: Referring Physician: Glendon Axe Treating Physician/Extender: Frann Rider in Treatment: 21 Verbal / Phone Orders: Yes Clinician: Macarthur Critchley Read Back and Verified: Yes Diagnosis Coding Wound Cleansing Wound #5 Right,Posterior Lower Leg o May shower with protection. Wound #6 Right,Medial Lower Leg o May shower with protection. Primary Wound Dressing Wound #5 Right,Posterior Lower Leg o Dry Gauze o Boardered Foam Dressing o Drawtex o Mepitel One - over Apligraf Wound #6 Right,Medial Lower Leg o Dry Gauze o Boardered Foam Dressing o Drawtex o Mepitel One - over Apligraf o Other: - carboflex Dressing Change Frequency Wound #5 Right,Posterior Lower Leg o Change dressing every week o Other: - may change outer dressings if excessive drainage and odor Wound #6 Right,Medial Lower Leg o Change dressing every week o Other: - may change outer dressings if excessive drainage and odor Follow-up Appointments Wound #5 Right,Posterior Lower Leg o Return Appointment in 1 week. Wound #6 Right,Medial Lower Leg o Return Appointment in 1  week. IVANNA, WIEMER ([id number]) Edema Control Wound #5 Right,Posterior Lower Leg o Elevate legs to the level of the heart and pump ankles as often as possible o Other: - wear compression hose Wound #6 Right,Medial Lower Leg o Elevate legs to the level of the heart and pump ankles as often as possible o Other: - wear compression hose Advanced Therapies Wound #5 Right,Posterior Lower Leg o Apligraf application in clinic; including contact layer, fixation with steri strips, dry gauze and cover dressing. Wound #6 Right,Medial Lower Leg o Apligraf application in clinic; including contact layer, fixation with steri strips, dry gauze and cover dressing. Electronic Signature(s) Signed: 02/23/2016 4:16:10 PM By: [name], [name] Signed: 02/23/2016 4:34:19 PM By: Rebecca Eaton RN, Romero Liner By: Rebecca Eaton RN, [name] on 02/23/2016 14:14:35 Lanahan, Aleni [name] Kitchen ([id number]) -------------------------------------------------------------------------------- Problem List Details Patient Name: COCO, MENZIE [name] Date of Service: 02/23/2016 1:30 PM Medical Record Number: [id number] Patient Account Number: [id number] Date of Birth/Sex: [birth year] (70 y.o. Female) Treating RN: [name] Primary Care Physician: Glendon Axe Other Clinician: Referring Physician: Glendon Axe Treating Physician/Extender: Frann Rider in Treatment: 25 Active  Problems ICD-10 Encounter Code Description Active Date Diagnosis E11.622 Type 2 diabetes mellitus with other skin ulcer 01/31/2015 Yes E66.01 Morbid (severe) obesity due to excess calories 01/31/2015 Yes I89.0 Lymphedema, not elsewhere classified 01/31/2015 Yes L97.212 Non-pressure chronic ulcer of right calf with fat layer 11/24/2015 Yes exposed I87.311 Chronic venous hypertension (idiopathic) with ulcer of 11/24/2015 Yes right lower extremity Inactive Problems Resolved Problems ICD-10 Code Description Active Date Resolved Date L97.322  Non-pressure chronic ulcer of left ankle with fat layer 01/31/2015 01/31/2015 exposed [id number] Varicose veins of left lower extremity with both ulcer of 03/07/2015 03/07/2015 calf and inflammation I83.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 with both ulcer of 03/07/2015 03/07/2015 ankle and inflammation [name], [name] ([id number]) L03.116 Cellulitis of left lower limb 07/07/2015 07/07/2015 Electronic Signature(s) Signed: 02/23/2016 2:21:24 PM By: [name], [name] Entered By: [name] on 02/23/2016 14:21:23 [name], [name] ([id number]) -------------------------------------------------------------------------------- Progress Note Details Patient Name: [name], [name] Date of Service: 02/23/2016 1:30 PM Medical Record Number: [id number] Patient Account Number: [id number] Date of Birth/Sex: 1946-02-06 (70 y.o. Female) Treating RN: [name] Primary Care Physician: Glendon Axe Other Clinician: Referring Physician: Glendon Axe Treating Physician/Extender: Frann Rider in Treatment: 83 Subjective Chief Complaint Information obtained from Patient Patient presents to the wound care center for a consult due non healing wound 70 year old patient comes with a history of having a ulcer on the left lower extremity for the past 4 weeks. she says she's had swelling of both lower extremities for about a year after she started having prednisone. 02/07/2015 -- her vascular appointments obtained were in the first and third week of June. she is able to go to [place] and we will try and get her some earlier appointments. Other than that nothing else has changed in her management. History of Present Illness (HPI) 70 year old patient who is known to have diabetes mellitus type 2, chronic renal insufficiency, coronary artery disease, hypertension, hypercholesterolemia, temporal arteritis and inflammatory arthritiss also has a history of having a hysterectomy and some orthopedic related  surgeries. The ulcer on the left lower extremity started off as a blister and then. Got progressively worse. She does not have any fever or chills and has not had any recent surgical intervention for this. Her last hemoglobin A1c was 10.1 in September 2015. She has been recently put on doxycycline by her PCP. She is now also allergic to doxycycline and was this was changed over to Keflex. 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 She does see a cardiologist and also takes a diuretic. 02/07/2015 her arterial and venous duplex studies to be done have dates been given as the first and third week of June. This is at [facility]. We are going to try and get early appointments at [facility]. other than that nothing has changed in her management. 02/14/2015 -- we have been able to get her an appointment in [facility] on May 20 which is much earlier than her previous ones at [place]. She continues with her prednisone and her sugars are in the range of 150-200. 02/21/2015 We were able to get a vascular lab workup for her today and she is going to be there at 2:00 this afternoon. the swelling of her leg has gone down significantly but she still has some tenderness over the wounds. 02/28/2015 - She has had one of two vascular workups done, and this coming  Tuesday has another, at [place] [facility]. She continues to be on steroid medications. She has significant sensitivity in her left lower extremity and has pain suggestive of neuropathic pain and I have asked her to address this with her primary care physician. 03/07/2015 -- The patient saw Dr. Lucky Cowboy for a consultation and he has had her arteries are okay but she has 2 incompetent veins on the left lower extremity and he is going to set her up for surgery. Official report is awaited. Addendum: Official reports are now  available and on 03/04/2015. She was seen and lower extremity Tabor, Tyaisha [name] ([id number]) venous duplex exam was done. There was reflux present within the left greater saphenous vein below the knee and also the left small saphenous vein. Arterial duplex showed normal triphasic waveforms throughout the left lower extremity without any significant stenosis. Her ABIs were noncompressible bilaterally but a waveforms were normal and a digital pressures were normal bilaterally consistent with no significant arterial insufficiency. He has recommended endovenous ablation of both the left small saphenous and the left great saphenous vein. This would still be scheduled later. 03/14/2015 -- she has heard back from the vascular office and has surgery scheduled for sometime in July. Her rheumatologist has decreased her prednisone dosage but she still on it. She has also had cataract surgery in her right eye recently this week. 03/20/2015 - No new complaints today. Pain improved. No fever or chills. Tolerating 2 layer compression. 04/14/2015 -- she was doing very well today she went off on vacation and now her edema has increased markedly the ulceration is bigger and her diabetes is not under control. 04/21/2015 -- I spoke to her PCP Dr. Candiss Norse and discussed the management which would include being seen by a general surgeon for debridement and taking multiple punch biopsies which would help in establishing the diagnosis of this is a vasculitis. She is agreeable about this and will set her up for the procedure with Dr. Tamala Julian at [facility]. She was seen by the surgeon Dr. Jamal Collin. 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 She is scheduled to have laser ablation done next week.Pt was sent here for possible biopsy to look for vasculitis. Feel it would be better to wait after laser ablation is  completed- the ulcer may heal fully and biopsy may not be necessary 04/29/2015 -- she had the venous ablation done by Dr. Lucky Cowboy last Friday and we do not have any notes yet. She is doing fine otherwise. 05/06/2015 --Review of her recent vascular intervention shows that she was seen by Dr. Lucky Cowboy on 04/29/2015. The follow-up duplex which was done showed that both the great saphenous vein and the small saphenous vein remained patent with reflux consistent with an unsuccessful ablation. He has rescheduled her for another the endovenous ablation to be done in about 4 weeks time. 05/13/2015 -- he was seen by her surgeon Dr. Jamal Collin who asked her to continue with conservative therapy and he would speak to Dr. Lucky Cowboy about her management. Dr. Lucky Cowboy is going to schedule her surgery in the middle of August for a repeat endovenous ablation. Her pus culture from last week has grown : Comunas her noted her sensitivity report but due to her multiple allergies I had tried clindamycin and she developed a rash with this too. She has been prescribed and anti-buttocks  in the ER and is has it at home and she will let is know what she is going to be taking. 05/20/2015 -- she has developed a small spot on her right lower extremity but besides that it is not a full fledged ulceration. She did not get to see Dr. Lucky Cowboy last week and hopefully she will see him in the near future. 05/27/2015 -she is still awaiting her appointment with Dr.Dew and her vascular procedure is not scheduled until August 19. She will be seeing her PCP tomorrow and I have asked her to convey our discussion so that she is aware that debridement has not been done yet. 06/03/2015 -- was seen by her rheumatologist Dr. 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 He is lowering her prednisone to 12-1/2 mg for 1 month and then 10 mg per the next month. I will again make an attempt to speak to her PCP Dr. Candiss Norse and her surgeon Dr. 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 06/17/2015 -- Dr.Dew did her vascular procedure last week and a follow-up venous ultrasound shows good resolution of the veins as per the patient's history. He is to see her back in 2 weeks. 07/07/2015. -- the patient has had a heavy growth of Proteus mirabilis and Enterococcus faecalis. These are sensitive to several drugs but the problem is she has allergies to all of these and hence I would like her to see Dr. Ola Spurr for this. She is also due to see Dr. Lucky Cowboy tomorrow and I will discussed the management with him including debridement under anesthesia and possible biopsies. 07/14/2015 -- she has an appointment to see Dr. Ola Spurr tomorrow and did see Dr. Bunnie Domino PA who will discuss my request with him. 07/21/2015 -- saw Dr. Ola Spurr was able to do a test on her and has put her on amoxicillin. She has been tolerating that and has had no problems with allergies to this. 07/28/2015 -- Last Friday I spoke to Dr. Leotis Pain regarding her care and he said that her right leg did not need any surgery and on the left leg was doing pretty good. We did agree that if she undergoes any procedure in the future he would do a couple of punch biopsies of the wound. 08/18/2015 -- her right leg is very tender and there is significant amount of slough. The left leg is looking much better 09/02/2015 -- she is going to have a debridement and punch biopsies of her right lower extremity by Dr. Leotis Pain this coming Thursday. Also seen Dr. Ola Spurr who has continued her on ciprofloxacin. 09/09/2015 -- on 09/04/2015 Dr. Leotis Pain took her to surgery - Irrigation and  excisional debridement of skin, soft tissue, and muscle to about 40 cm2 to the right posterior calf with biopsy. Pathology results are -- DIAGNOSIS: SKIN, RIGHT LOWER EXTREMITY; BIOPSY: SKIN AND SOFT TISSUE WITH ULCERATION, SEE NOTE. - NEGATIVE FOR DYSPLASIA AND MALIGNANCY. Note: There is acute inflammation and ulceration of the epidermis. The dermis shows nonspecific inflammation, neovascularization, and hemosiderin deposition. The differential diagnosis for these findings includes stasis dermatitis, nonspecific dermatitis, and infection. Correlation with clinical findings is required. A PAS fungal stain is obtained and results will be reported in an addendum. cultures were also sent and this grew Pseudomonas  aeruginosa, Escherichia coli, Proteus mirabilis, Klebsiella pneumoniae and they were all sensitive to ciprofloxacin which she is on. 09/16/2015 -- he saw Dr. 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 He is going to call the patient in and discuss with her the management possibly with Imuran. I will continue treating her locally. 09/23/2015 -- she saw Dr. Ola Spurr today who was going to continue the antibiotics for now and stop after this course. She has an appointment to see Dr. Jefm Bryant in about a week's time. Her wound VAC has arrived but she did not bring it with her today. We will try and set her up for changes to the right leg 3 times a week. She is here for her first application of Apligraf to the left lower extremity. 09/30/2015 -- she has seen Dr. 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 because she is ambulatory at home health [name], [name] ([id number]) will not apply a wound VAC and she will have to come here 3 times a week on Monday Wednesday  and Friday. 10/10/2015 -- she is here for a second application of Apligraf to her left lower extremity. 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 Jefm Bryant. Other than that she is doing well 10/31/2015 -- she is here for a third application of Apligraf to her left lower extremity. after reviewing her wound on the right side I noted that it is granulating extremely well and we will use the remnants of the Apligraf on the right leg. 11/14/2015 -- she was recently admitted to the hospital between January 11 and 11/10/2015 for uncontrolled hypertension, diabetes mellitus, acute kidney injury. during her admission she was supported by wound care and also by antibiotics. Around this time her immunosuppression was stopped by Dr. Nunzio Cory. She is feeling much better now. 11/21/2015 -- she has had her fourth application of Apligraf today and it has been shared between the left and the right leg 11/24/2015 -- was here for a wound VAC change but it was noticed that she had profuse bleeding from the lobe medial part of her right lower extremity and I was called in to evaluate her. 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 She has been doing fine since then 12/19/2015 -- she has finished 5 applications of Apligraf to the left lower extremity and this is doing great. He began with pain and swelling of her right ankle a couple of days ago and it has caused her much distress. She does not have any fever or any change in her general health. 12/22/2015 -- the patient's pain and swelling of the right lower extremity has resolved over the weekend. The left lower extremity is completely healed. She has had the first application of Apligraf on the right leg today. 01/02/2016 -- She is here for the second application of Apligraf on the right leg today. 01/19/2016  -- she is here for the third application of Apligraf to the right posterior lateral leg. 02/02/2016 -- today she has had her fourth application of Apligraf and we have applied some of it to the right posterior lateral leg and the remaining waist we have used for the left anterior medial leg.  02/12/2016 -- today she is use the fifth application of Apligraf to the right posterior lateral leg. The remaining to be wasted has been applied to the left anterior medial leg. Objective Constitutional Pulse regular. Respirations normal and unlabored. Afebrile. Vitals Time Taken: 1:51 PM, Height: 65 in, Weight: 248 lbs, BMI: 41.3, Temperature: 98.1 F, Pulse: 88 bpm, Respiratory Rate: 18 breaths/min, Blood Pressure: 172/64 mmHg. Eyes Nonicteric. Reactive to light. Sarafian, Kaesha [name] ([id number]) Ears, Nose, Mouth, and Throat Lips, teeth, and gums WNL.Marland Kitchen Moist mucosa without lesions. Neck supple and nontender. No palpable supraclavicular or cervical adenopathy. Normal sized without goiter. Respiratory WNL. No retractions.. Cardiovascular Pedal Pulses WNL. No clubbing, cyanosis or edema. Lymphatic No adneopathy. No adenopathy. No adenopathy. Musculoskeletal Adexa without tenderness or enlargement.. Digits and nails w/o clubbing, cyanosis, infection, petechiae, ischemia, or inflammatory conditions.Marland Kitchen Psychiatric Judgement and insight Intact.. No evidence of depression, anxiety, or agitation.. General Notes: the right lower extremity wound has quite a bit of significant drainage which has a order due to the fact that it was not changed all week. The Apligraf is intact. Integumentary (Hair, Skin) No suspicious lesions. No crepitus or fluctuance. No peri-wound warmth or erythema. No masses.. Wound #5 status is Open. Original cause of wound was Gradually Appeared. The wound is located on the Right,Posterior Lower Leg. The wound measures 2.2cm length x 2.5cm width x 0.1cm depth; 4.32cm^2 area and 0.432cm^3  volume. The wound is limited to skin breakdown. There is no tunneling or undermining noted. There is a medium amount of serosanguineous drainage noted. The wound margin is indistinct and nonvisible. There is large (67-100%) pink, pale granulation within the wound bed. There is a small (1-33%) amount of necrotic tissue within the wound bed including Adherent Slough. The periwound skin appearance exhibited: Localized Edema, Moist. The periwound skin appearance did not exhibit: Callus, Crepitus, Excoriation, Fluctuance, Friable, Induration, Rash, Scarring, Dry/Scaly, Maceration, Atrophie Blanche, Cyanosis, Ecchymosis, Hemosiderin Staining, Mottled, Pallor, Rubor, Erythema. Periwound temperature was noted as No Abnormality. Wound #6 status is Open. Original cause of wound was Gradually Appeared. The wound is located on the Right,Medial Lower Leg. The wound measures 5.5cm length x 3.2cm width x 0.1cm depth; 13.823cm^2 area and 1.382cm^3 volume. The wound is limited to skin breakdown. There is no tunneling or undermining noted. There is a small amount of serous drainage noted. The wound margin is flat and intact. There is large (67-100%) red granulation within the wound bed. There is a small (1-33%) amount of necrotic tissue within the wound bed including Eschar and Adherent Slough. The periwound skin appearance exhibited: Moist. The periwound skin appearance did not exhibit: Callus, Crepitus, Excoriation, Fluctuance, Friable, Induration, Localized Edema, Rash, Scarring, Dry/Scaly, Maceration, Atrophie Blanche, Cyanosis, Ecchymosis, Hemosiderin Staining, Mottled, Pallor, Rubor, Erythema. Periwound temperature was noted as [name], [name] ([id number]) No Abnormality. The periwound has tenderness on palpation. 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 For the anterior medial wound we will now apply for the remaining 5 Apligraf's to be utilized She will come back to see me next week. Plan Wound Cleansing:  Wound #5 Right,Posterior Lower Leg: May shower with protection. Wound #6 Right,Medial Lower Leg: May shower with protection. Primary Wound Dressing: Wound #5 Right,Posterior Lower Leg: Dry Gauze Boardered Foam Dressing Drawtex Mepitel One - over Apligraf Wound #6 Right,Medial Lower Leg: Dry Gauze Boardered Foam Dressing Drawtex Mepitel One - over Apligraf Other: - carboflex Dressing Change Frequency: Wound #5 Right,Posterior Lower Leg: Dax, Rmoni [name] ([id number]) Change dressing every week Other: - may change outer dressings if excessive drainage and odor Wound #6 Right,Medial Lower Leg: Change dressing every week Other: - may change outer dressings if excessive drainage and odor Follow-up Appointments: Wound #5 Right,Posterior Lower Leg: Return Appointment in 1 week. Wound #6 Right,Medial Lower Leg: Return Appointment in 1 week. Edema Control: Wound #5 Right,Posterior Lower Leg: Elevate legs to the level of the heart and pump ankles as often as possible Other: - wear compression hose Wound #6 Right,Medial Lower Leg: Elevate legs to the level of the heart and pump ankles as often as possible Other: - wear compression hose Advanced Therapies: Wound #5 Right,Posterior Lower Leg: Apligraf application in clinic; including contact layer, fixation with steri strips, dry gauze and cover dressing. Wound #6 Right,Medial Lower Leg: Apligraf application in clinic; including contact layer, fixation with  steri strips, dry gauze and cover dressing. I have recommended more frequent dressing changes with Gore-Tex and complex if required and I have urged her to change it 2 or 3 times a week if needed. For the anterior medial wound we will now apply for the remaining 5 Apligraf's to be utilized She will come back to see me next week. Electronic Signature(s) Signed: 02/23/2016 2:24:40 PM By: [name], [name] Previous Signature: 02/23/2016 2:24:07 PM Version By: [name], [name] Entered By: [name] on 02/23/2016 14:24:40 [place], Zabella [name] ([id number]) -------------------------------------------------------------------------------- SuperBill Details Patient Name: SHANTRICE, KARA [name] Date of Service: 02/23/2016 Medical Record Number: [id number] Patient Account Number: [id number] Date of Birth/Sex: [birth year] (70 y.o. Female) Treating RN: [name] Primary Care Physician: Glendon Axe Other Clinician: Referring Physician: Glendon Axe Treating Physician/Extender: Frann Rider in Treatment: 55 Diagnosis Coding ICD-10 Codes Code Description E11.622 Type 2 diabetes mellitus with other skin ulcer E66.01 Morbid (severe) obesity due to excess calories I89.0 Lymphedema, not elsewhere classified L97.212 Non-pressure chronic ulcer of right calf with fat layer exposed I87.311 Chronic venous hypertension (idiopathic) with ulcer of right lower extremity Facility Procedures CPT4 Code: [id number] Description: 99214 - WOUND CARE VISIT-LEV 4 EST PT Modifier: Quantity: 1 Physician Procedures CPT4 Code: [id number] Description: [id number] - WC PHYS LEVEL 3 - EST PT ICD-10 Description Diagnosis E11.622 Type 2 diabetes mellitus with other skin ulcer E66.01 Morbid (severe) obesity due to excess calories I89.0 Lymphedema, not elsewhere classified L97.212 Non-pressure  chronic ulcer of right calf with fat Modifier: layer exposed Quantity: 1 Electronic Signature(s) Signed: 02/23/2016 4:16:10 PM By:  [name], [name] Signed: 02/23/2016 4:34:19 PM By: Rebecca Eaton RN, [name] Previous Signature: 02/23/2016 2:24:56 PM Version By: [name], [name] Entered By: Rebecca Eaton RN, [name] on 02/23/2016 14:28:50

## 2016-03-01 ENCOUNTER — Encounter: Payer: Commercial Managed Care - HMO | Admitting: Surgery

## 2016-03-01 DIAGNOSIS — E11622 Type 2 diabetes mellitus with other skin ulcer: Secondary | ICD-10-CM | POA: Diagnosis not present

## 2016-03-04 NOTE — Progress Notes (Signed)
[name] ([id number]) Visit Report for 03/01/2016 Arrival Information Details Patient Name: [name] Date of Service: 03/01/2016 10:00 AM Medical Record Number: [id number] Patient Account Number: [id number] Date of Birth/Sex: Feb 11, 1946 (70 y.o. Female) Treating RN: Carolyne Fiscal, Debi Primary Care Physician: Glendon Axe Other Clinician: Referring Physician: Glendon Axe Treating Physician/Extender: Frann Rider in Treatment: 95 Visit Information History Since Last Visit All ordered tests and consults were completed: No Patient Arrived: Cane Added or deleted any medications: No Arrival Time: 10:36 Any new allergies or adverse reactions: No Accompanied By: self Had a fall or experienced change in No Transfer Assistance: EasyPivot Patient activities of daily living that may affect Lift risk of falls: Patient Identification Verified: Yes Signs or symptoms of abuse/neglect since last No Secondary Verification Process Yes visito Completed: Hospitalized since last visit: No Patient Requires Transmission- No Pain Present Now: No Based Precautions: Patient Has Alerts: Yes Patient Alerts: Patient on Blood Thinner Electronic Signature(s) Signed: 03/03/2016 5:26:12 PM By: Alric Quan Entered By: Alric Quan on 03/01/2016 10:37:08 [name], [name] ([id number]) -------------------------------------------------------------------------------- Clinic Level of Care Assessment Details Patient Name: Kristin Stare D. Date of Service: 03/01/2016 10:00 AM Medical Record Number: [id number] Patient Account Number: [id number] Date of Birth/Sex: February 05, 1946 (70 y.o. Female) Treating RN: Carolyne Fiscal, Debi Primary Care Physician: Glendon Axe Other Clinician: Referring Physician: Glendon Axe Treating Physician/Extender: Frann Rider in Treatment: 16 Clinic Level of Care Assessment Items TOOL 4 Quantity Score X - Use when only an EandM is performed on FOLLOW-UP  visit 1 0 ASSESSMENTS - Nursing Assessment / Reassessment X - Reassessment of Co-morbidities (includes updates in patient status) 1 10 X - Reassessment of Adherence to Treatment Plan 1 5 ASSESSMENTS - Wound and Skin Assessment / Reassessment []  - Simple Wound Assessment / Reassessment - one wound 0 X - Complex Wound Assessment / Reassessment - multiple wounds 2 5 []  - Dermatologic / Skin Assessment (not related to wound area) 0 ASSESSMENTS - Focused Assessment []  - Circumferential Edema Measurements - multi extremities 0 []  - Nutritional Assessment / Counseling / Intervention 0 []  - Lower Extremity Assessment (monofilament, tuning fork, pulses) 0 []  - Peripheral Arterial Disease Assessment (using hand held doppler) 0 ASSESSMENTS - Ostomy and/or Continence Assessment and Care []  - Incontinence Assessment and Management 0 []  - Ostomy Care Assessment and Management (repouching, etc.) 0 PROCESS - Coordination of Care X - Simple Patient / Family Education for ongoing care 1 15 []  - Complex (extensive) Patient / Family Education for ongoing care 0 []  - Staff obtains Programmer, systems, Records, Test Results / Process Orders 0 []  - Staff telephones HHA, Nursing Homes / Clarify orders / etc 0 []  - Routine Transfer to another Facility (non-emergent condition) 0 [name], [name] ([id number]) []  - Routine Hospital Admission (non-emergent condition) 0 []  - New Admissions / Biomedical engineer / Ordering NPWT, Apligraf, etc. 0 []  - Emergency Hospital Admission (emergent condition) 0 X - Simple Discharge Coordination 1 10 []  - Complex (extensive) Discharge Coordination 0 PROCESS - Special Needs []  - Pediatric / Minor Patient Management 0 []  - Isolation Patient Management 0 []  - Hearing / Language / Visual special needs 0 []  - Assessment of Community assistance (transportation, D/C planning, etc.) 0 []  - Additional assistance / Altered mentation 0 []  - Support Surface(s) Assessment (bed, cushion, seat,  etc.) 0 INTERVENTIONS - Wound Cleansing / Measurement []  - Simple Wound Cleansing - one wound 0 X - Complex Wound Cleansing - multiple wounds 2 5 []  - Wound Imaging (  photographs - any number of wounds) 0 X - Wound Tracing (instead of photographs) 1 5 []  - Simple Wound Measurement - one wound 0 X - Complex Wound Measurement - multiple wounds 2 5 INTERVENTIONS - Wound Dressings X - Small Wound Dressing one or multiple wounds 1 10 X - Medium Wound Dressing one or multiple wounds 1 15 []  - Large Wound Dressing one or multiple wounds 0 X - Application of Medications - topical 1 5 []  - Application of Medications - injection 0 INTERVENTIONS - Miscellaneous []  - External ear exam 0 [name], [name] ([id number]) []  - Specimen Collection (cultures, biopsies, blood, body fluids, etc.) 0 []  - Specimen(s) / Culture(s) sent or taken to Lab for analysis 0 []  - Patient Transfer (multiple staff / [name]) 0 []  - Simple Staple / Suture removal (25 or less) 0 []  - Complex Staple / Suture removal (26 or more) 0 []  - Hypo / Hyperglycemic Management (close monitor of Blood Glucose) 0 []  - Ankle / Brachial Index (ABI) - do not check if billed separately 0 X - Vital Signs 1 5 Has the patient been seen at the hospital within the last three years: Yes Total Score: 110 Level Of Care: New/Established - Level 3 Electronic Signature(s) Signed: 03/03/2016 5:26:12 PM By: Alric Quan Entered By: Alric Quan on 03/02/2016 14:20:04 [name], [name] ([id number]) -------------------------------------------------------------------------------- Encounter Discharge Information Details Patient Name: [name], [name] Date of Service: 03/01/2016 10:00 AM Medical Record Number: [id number] Patient Account Number: [id number] Date of Birth/Sex: 1946/10/05 (70 y.o. Female) Treating RN: Carolyne Fiscal, Debi Primary Care Physician: Glendon Axe Other Clinician: Referring Physician: Glendon Axe Treating  Physician/Extender: Frann Rider in Treatment: 52 Encounter Discharge Information Items Discharge Pain Level: 0 Discharge Condition: Stable Ambulatory Status: Cane Discharge Destination: Home Transportation: Private Auto Accompanied By: self Schedule Follow-up Appointment: Yes Medication Reconciliation completed Yes and provided to Patient/Care Jaequan Propes: Provided on Clinical Summary of Care: 03/01/2016 Form Type Recipient Paper Patient LM Electronic Signature(s) Signed: 03/01/2016 11:22:37 AM By: Ruthine Dose Entered By: Ruthine Dose on 03/01/2016 11:22:36 [name], [name] ([id number]) -------------------------------------------------------------------------------- Lower Extremity Assessment Details Patient Name: Kristin Stare D. Date of Service: 03/01/2016 10:00 AM Medical Record Number: [id number] Patient Account Number: [id number] Date of Birth/Sex: 20-Sep-1946 (70 y.o. Female) Treating RN: Carolyne Fiscal, Debi Primary Care Physician: Glendon Axe Other Clinician: Referring Physician: Glendon Axe Treating Physician/Extender: Frann Rider in Treatment: 56 Vascular Assessment Pulses: Posterior Tibial Dorsalis Pedis Palpable: [Right:Yes] Extremity colors, hair growth, and conditions: Extremity Color: [Right:Normal] Temperature of Extremity: [Right:Warm] Capillary Refill: [Right:< 3 seconds] Electronic Signature(s) Signed: 03/03/2016 5:26:12 PM By: Alric Quan Entered By: Alric Quan on 03/01/2016 10:38:12 [name], [name] ([id number]) -------------------------------------------------------------------------------- Multi Wound Chart Details Patient Name: Kristin Stare D. Date of Service: 03/01/2016 10:00 AM Medical Record Number: [id number] Patient Account Number: [id number] Date of Birth/Sex: May 11, 1946 (70 y.o. 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] [N/A:N/A] Comorbid History: [5:Cataracts, Asthma, Coronary Artery Disease, Coronary Artery Disease, Hypertension, Type II Diabetes, Osteoarthritis, Diabetes, Osteoarthritis, Neuropathy] [6:Cataracts, Asthma, Hypertension, Type II  Neuropathy] [N/A:N/A] Date Acquired: [5:04/29/2015] [6:10/10/2015] [N/A:N/A] Weeks of Treatment: [5:40] [6:20] [N/A:N/A] Wound Status: [5:Open] [6:Open] [N/A:N/A] Measurements L x W x D 1x1.5x0.1 [6:5.4x3.9x0.1] [N/A:N/A] (cm) Area (cm) : [5:1.178] [6:16.54] [N/A:N/A] Volume (cm) : [5:0.118] [6:1.654] [N/A:N/A] % Reduction in Area: [5:-526.60%] [6:-378.60%] [N/A:N/A] % Reduction in Volume: -521.10% [6:-378.00%] [N/A:N/A] Classification: [5:Full Thickness Without Exposed Support Structures] [6:Grade 1] [N/A:N/A] HBO Classification: [5:Grade 2] [6:N/A] [N/A:N/A] Exudate Amount: [5:Large] [6:Medium] [N/A:N/A] Exudate Type: [5:Serosanguineous] [6:Serous] [N/A:N/A] Exudate Color: [5:red, brown] [6:amber] [N/A:N/A] Wound Margin: [5:Indistinct, nonvisible] [6:Flat and Intact] [N/A:N/A] Granulation Amount: [5:Large (67-100%)] [6:Large (67-100%)] [N/A:N/A] Granulation Quality: [5:Pink, Pale] [6:Red] [N/A:N/A] Necrotic Amount: Small (1-33%) Small (1-33%) N/A Necrotic Tissue: Adherent Slough Eschar, Adherent Slough N/A Exposed Structures: Fascia: No Fascia: No N/A Fat: No Fat: No Tendon: No Tendon: No Muscle: No Muscle: No Joint: No Joint:  No Bone: No Bone: No Limited to Skin Limited to Skin Breakdown Breakdown Epithelialization: None None N/A Periwound Skin Texture: Edema: Yes Edema: No N/A Excoriation: No Excoriation: No Induration: No Induration: No Callus: No Callus: No Crepitus: No Crepitus: No Fluctuance: No Fluctuance: No Friable: No Friable: No Rash: No Rash: No Scarring: No Scarring: No Periwound Skin Moist: Yes Moist: Yes N/A Moisture: Maceration: No Maceration: No Dry/Scaly: No Dry/Scaly: No Periwound Skin Color: Atrophie Blanche: No Atrophie Blanche: No N/A Cyanosis: No Cyanosis: No Ecchymosis: No Ecchymosis: No Erythema: No Erythema: No Hemosiderin Staining: No Hemosiderin Staining: No Mottled: No Mottled: No Pallor: No Pallor: No Rubor: No Rubor: No Temperature: No Abnormality No Abnormality N/A Tenderness on No Yes N/A Palpation: Wound Preparation: Ulcer Cleansing: Ulcer Cleansing: N/A Rinsed/Irrigated with Rinsed/Irrigated with Saline Saline Topical Anesthetic Topical Anesthetic Applied: Other: lidocaine Applied: Other: lidocaine 4% 4% Treatment Notes Electronic Signature(s) Signed: 03/03/2016 5:26:12 PM By: Alric Quan Entered By: Alric Quan on 03/01/2016 10:58:38 [name], [name] ([id number]) -------------------------------------------------------------------------------- [place] Details Patient Name: LINDASY, ZAIS D. Date of Service: 03/01/2016 10:00 AM Medical Record Number: [id number] Patient Account Number: [id number] Date of Birth/Sex: 06-26-1946 (70 y.o. Female) Treating RN: Carolyne Fiscal, Debi Primary Care Physician: Glendon Axe Other Clinician: Referring Physician: Glendon Axe Treating Physician/Extender: Frann Rider in Treatment: 40 Active Inactive Orientation to the Wound Care Program Nursing Diagnoses: Knowledge deficit related to the wound healing center program Goals: Patient/caregiver will verbalize  understanding of the [facility] [place] Program Date Initiated: 01/31/2015 Goal Status: Active Interventions: Provide education on orientation to the wound center Notes: Venous Leg Ulcer Nursing Diagnoses: Potential for venous Insuffiency (use before diagnosis confirmed) Goals: Non-invasive venous studies are completed as ordered Date Initiated: 01/31/2015 Goal Status: Active Patient/caregiver will verbalize understanding of disease process and disease management Date Initiated: 01/31/2015 Goal Status: Active Interventions: Assess peripheral edema status every visit. Notes: Wound/Skin Impairment Nursing Diagnoses: Impaired tissue integrity Knowledge deficit related to smoking impact on wound healing [name], [name] ([id number]) Goals: Patient/caregiver will verbalize understanding of skin care regimen Date Initiated: 01/31/2015 Goal Status: Active Ulcer/skin breakdown will heal within 14 weeks Date Initiated: 01/31/2015 Goal Status: Active Interventions: Assess ulceration(s) every visit Notes: Electronic Signature(s) Signed: 03/03/2016 5:26:12 PM By: Alric Quan Entered By: Alric Quan on 03/01/2016 10:58:29 [name], [name] ([id number]) -------------------------------------------------------------------------------- Pain Assessment Details Patient Name: Kristin Stare D. Date of Service: 03/01/2016 10:00 AM Medical Record Number: [id number] Patient Account Number: [id number] Date of Birth/Sex: 05/03/1946 (70 y.o. Female) Treating RN: Carolyne Fiscal, Debi Primary Care Physician: Glendon Axe Other Clinician: Referring Physician: Glendon Axe Treating Physician/Extender: Frann Rider in Treatment: 36 Active Problems Location of  Pain Severity and Description of Pain Patient Has Paino No Site Locations Pain Management and Medication Current Pain Management: Electronic Signature(s) Signed: 03/03/2016 5:26:12 PM By: Alric Quan Entered By: Alric Quan on  03/01/2016 10:37:15 [name], [name] ([id number]) -------------------------------------------------------------------------------- Patient/Caregiver Education Details Patient Name: MALISE, YANIK D. Date of Service: 03/01/2016 10:00 AM Medical Record Number: [id number] Patient Account Number: [id number] Date of Birth/Gender: 1946-09-23 (70 y.o. Female) Treating RN: Carolyne Fiscal, Debi Primary Care Physician: Glendon Axe Other Clinician: Referring Physician: Glendon Axe Treating Physician/Extender: Frann Rider in Treatment: 24 Education Assessment Education Provided To: Patient Education Topics Provided Wound/Skin Impairment: Handouts: Other: change dressing as ordered Methods: Demonstration, Explain/Verbal Responses: State content correctly Electronic Signature(s) Signed: 03/03/2016 5:26:12 PM By: Alric Quan Entered By: Alric Quan on 03/01/2016 11:07:45 [name], [name] ([id number]) -------------------------------------------------------------------------------- Wound Assessment Details Patient Name: [name], [name] Date of Service: 03/01/2016 10:00 AM Medical Record Number: [id number] Patient Account Number: [id number] Date of Birth/Sex: 07/29/1946 (70 y.o. Female) Treating RN: Carolyne Fiscal, Debi Primary Care Physician: Glendon Axe Other Clinician: Referring Physician: Glendon Axe Treating Physician/Extender: Frann Rider in Treatment: 65 Wound Status Wound Number: 5 Primary Venous Leg Ulcer Etiology: Wound Location: Right Lower Leg - Posterior Wound Open Wounding Event: Gradually Appeared Status: Date Acquired: 04/29/2015 Comorbid Cataracts, Asthma, Coronary Artery Weeks Of Treatment: 40 History: Disease, Hypertension, Type II Clustered Wound: No Diabetes, Osteoarthritis, Neuropathy Photos Photo Uploaded By: Alric Quan on 03/01/2016 16:33:40 Wound Measurements Length: (cm) 1 Width: (cm) 1.5 Depth: (cm) 0.1 Area: (cm) 1.178 Volume:  (cm) 0.118 % Reduction in Area: -526.6% % Reduction in Volume: -521.1% Epithelialization: None Tunneling: No Undermining: No Wound Description Full Thickness Without Exposed Classification: Support Structures Diabetic Severity Grade 2 (Wagner): Wound Margin: Indistinct, nonvisible Exudate Amount: Large Exudate Type: Serosanguineous Exudate Color: red, brown Foul Odor After Cleansing: No Wound Bed Granulation Amount: Large (67-100%) Exposed Structure [name], Yalena D. ([id number]) Granulation Quality: Pink, Pale Fascia Exposed: No Necrotic Amount: Small (1-33%) Fat Layer Exposed: No Necrotic Quality: Adherent Slough Tendon Exposed: No Muscle Exposed: No Joint Exposed: No Bone Exposed: No Limited to Skin Breakdown Periwound Skin Texture Texture Color No Abnormalities Noted: No No Abnormalities Noted: No Callus: No Atrophie Blanche: No Crepitus: No Cyanosis: No Excoriation: No Ecchymosis: No Fluctuance: No Erythema: No Friable: No Hemosiderin Staining: No Induration: No Mottled: No Localized Edema: Yes Pallor: No Rash: No Rubor: No Scarring: No Temperature / Pain Moisture Temperature: No Abnormality No Abnormalities Noted: No Dry / Scaly: No Maceration: No Moist: Yes Wound Preparation Ulcer Cleansing: Rinsed/Irrigated with Saline Topical Anesthetic Applied: Other: lidocaine 4%, Treatment Notes Wound #5 (Right, Posterior Lower Leg) 1. Cleansed with: Clean wound with Normal Saline 2. Anesthetic Topical Lidocaine 4% cream to wound bed prior to debridement 3. Peri-wound Care: Skin Prep 4. Dressing Applied: Hydrafera Blue 5. Secondary Dressing Applied Bordered Foam Dressing Electronic Signature(s) Signed: 03/03/2016 5:26:12 PM By: Alric Quan Entered By: Alric Quan on 03/01/2016 10:45:34 Bradeen, Kamla D. ([id number]) Pickelsimer, Marili D. ([id number]) -------------------------------------------------------------------------------- Wound  Assessment Details Patient Name: Loch, Vy D. Date of Service: 03/01/2016 10:00 AM Medical Record Number: [id number] Patient Account Number: [id number] Date of Birth/Sex: 05-Apr-1946 (70 y.o. Female) Treating RN: Carolyne Fiscal, Debi Primary Care Physician: Glendon Axe Other Clinician: Referring Physician: Glendon Axe Treating Physician/Extender: Frann Rider in Treatment: 56 Wound Status Wound Number: 6 Primary Diabetic Wound/Ulcer of the Lower Etiology: Extremity Wound Location: Right Lower Leg - Medial Wound Open Wounding Event: Gradually Appeared Status: Date Acquired: 10/10/2015 Comorbid Cataracts, Asthma, Coronary Artery  Weeks Of Treatment: 20 History: Disease, Hypertension, Type II Clustered Wound: No Diabetes, Osteoarthritis, Neuropathy Photos Photo Uploaded By: Alric Quan on 03/01/2016 16:33:40 Wound Measurements Length: (cm) 5.4 % Reduction in Width: (cm) 3.9 % Reduction in Depth: (cm) 0.1 Epithelializati Area: (cm) 16.54 Tunneling: Volume: (cm) 1.654 Undermining: Area: -378.6% Volume: -378% on: None No No Wound Description Classification: Grade 1 Wound Margin: Flat and Intact Exudate Amount: Medium Exudate Type: Serous Exudate Color: amber Foul Odor After Cleansing: No Wound Bed Granulation Amount: Large (67-100%) Exposed Structure Granulation Quality: Red Fascia Exposed: No Necrotic Amount: Small (1-33%) Fat Layer Exposed: No Necrotic Quality: Eschar, Adherent Slough Tendon Exposed: No Baty, Lillyan D. ([id number]) Muscle Exposed: No Joint Exposed: No Bone Exposed: No Limited to Skin Breakdown Periwound Skin Texture Texture Color No Abnormalities Noted: No No Abnormalities Noted: No Callus: No Atrophie Blanche: No Crepitus: No Cyanosis: No Excoriation: No Ecchymosis: No Fluctuance: No Erythema: No Friable: No Hemosiderin Staining: No Induration: No Mottled: No Localized Edema: No Pallor: No Rash: No Rubor:  No Scarring: No Temperature / Pain Moisture Temperature: No Abnormality No Abnormalities Noted: No Tenderness on Palpation: Yes Dry / Scaly: No Maceration: No Moist: Yes Wound Preparation Ulcer Cleansing: Rinsed/Irrigated with Saline Topical Anesthetic Applied: Other: lidocaine 4%, Treatment Notes Wound #6 (Right, Medial Lower Leg) 1. Cleansed with: Clean wound with Normal Saline 2. Anesthetic Topical Lidocaine 4% cream to wound bed prior to debridement 3. Peri-wound Care: Skin Prep 4. Dressing Applied: Hydrafera Blue 5. Secondary Dressing Applied Bordered Foam Dressing Electronic Signature(s) Signed: 03/03/2016 5:26:12 PM By: Alric Quan Entered By: Alric Quan on 03/01/2016 10:46:14 Schow, Cathy D. ([id number]) -------------------------------------------------------------------------------- Vitals Details Patient Name: Kristin Stare D. Date of Service: 03/01/2016 10:00 AM Medical Record Number: [id number] Patient Account Number: [id number] Date of Birth/Sex: 24-Jan-1946 (70 y.o. Female) Treating RN: Carolyne Fiscal, Debi Primary Care Physician: Glendon Axe Other Clinician: Referring Physician: Glendon Axe Treating Physician/Extender: Frann Rider in Treatment: 5 Vital Signs Time Taken: 10:37 Temperature (F): 98.2 Height (in): 65 Pulse (bpm): 84 Weight (lbs): 248 Respiratory Rate (breaths/min): 18 Body Mass Index (BMI): 41.3 Blood Pressure (mmHg): 170/62 Reference Range: 80 - 120 mg / dl Electronic Signature(s) Signed: 03/03/2016 5:26:12 PM By: Alric Quan Entered By: Alric Quan on 03/01/2016 10:37:49

## 2016-03-04 NOTE — Progress Notes (Signed)
REMINGTON, PIEKOS ([id number]) Visit Report for 03/01/2016 Chief Complaint Document Details Patient Name: [name], [name] 03/01/2016 10:00 Date of Service: AM Medical Record [id number] Number: Patient Account Number: [id number] [birth year] (70 y.o. Treating RN: [name] Date of Birth/Sex: Female) Other Clinician: Primary Care Physician: [facility], Delana Meyer Treating [name] Referring Physician: Glendon Axe Physician/Extender: Weeks in Treatment: 64 Information Obtained from: Patient Chief Complaint Patient presents to the wound care center for a consult due non healing wound 70 year old patient comes with a history of having a ulcer on the left lower extremity for the past 4 weeks. she says she's had swelling of both lower extremities for about a year after she started having prednisone. 02/07/2015 -- her vascular appointments obtained were in the first and third week of June. she is able to go to [place] and we will try and get her some earlier appointments. Other than that nothing else has changed in her management. Electronic Signature(s) Signed: 03/01/2016 11:11:11 AM By: [name], [name] Entered By: [name] on 03/01/2016 11:11:11 JONQUIL, PIERE ([id number]) -------------------------------------------------------------------------------- HPI Details Patient Name: [name], [name] 03/01/2016 10:00 Date of Service: AM Medical Record [id number] Number: Patient Account Number: [id number] 18-Sep-1946 (70 y.o. Treating RN: [name] Date of Birth/Sex: Female) Other Clinician: Primary Care Physician: [facility], Delana Meyer Treating Rushil Kimbrell Referring Physician: Glendon Axe Physician/Extender: Weeks in Treatment: 78 History of Present Illness HPI Description: 70 year old patient who is known to have diabetes mellitus type 2, chronic renal insufficiency, coronary artery disease, hypertension, hypercholesterolemia, temporal arteritis and inflammatory arthritiss  also has a history of having a hysterectomy and some orthopedic related surgeries. The ulcer on the left lower extremity started off as a blister and then. Got progressively worse. She does not have any fever or chills and has not had any recent surgical intervention for this. Her last hemoglobin A1c was 10.1 in September 2015. She has been recently put on doxycycline by her PCP. She is now also allergic to doxycycline and was this was changed over to Keflex. 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 She does see a cardiologist and also takes a diuretic. 02/07/2015 her arterial and venous duplex studies to be done have dates been given as the first and third week of June. This is at [facility]. We are going to try and get early appointments at [facility]. other than that nothing has changed in her management. 02/14/2015 -- we have been able to get her an appointment in [facility] on May 20 which is much earlier than her previous ones at [place]. She continues with her prednisone and her sugars are in the range of 150-200. 02/21/2015 We were able to get a vascular lab workup for her today and she is going to be there at 2:00 this afternoon. the swelling of her leg has gone down significantly but she still has some tenderness over the wounds. 02/28/2015 - She has had one of two vascular workups done, and this coming Tuesday has another, at [place] [facility]. She continues to be on steroid medications. She has significant sensitivity in her left lower extremity and has pain suggestive of neuropathic pain and I have asked her to address this with her primary care physician. 03/07/2015 -- The patient saw Dr. Lucky Cowboy for a consultation and he has had her arteries are okay but she has 2 incompetent veins on the  left lower extremity and he is going to set her up for surgery.  Official report is awaited. Addendum: Official reports are now available and on 03/04/2015. She was seen and lower extremity venous duplex exam was done. There was reflux present within the left greater saphenous vein below the knee and also the left small saphenous vein. Arterial duplex showed normal triphasic waveforms throughout the left lower extremity without any significant stenosis. Her ABIs were noncompressible bilaterally but a waveforms were normal and a digital pressures were normal bilaterally consistent with no significant arterial insufficiency. He has recommended endovenous ablation of both the left small saphenous and the left great saphenous vein. This would still be scheduled later. 03/14/2015 -- she has heard back from the vascular office and has surgery scheduled for sometime in July. [name], [name] ([id number]) Her rheumatologist has decreased her prednisone dosage but she still on it. She has also had cataract surgery in her right eye recently this week. 03/20/2015 - No new complaints today. Pain improved. No fever or chills. Tolerating 2 layer compression. 04/14/2015 -- she was doing very well today she went off on vacation and now her edema has increased markedly the ulceration is bigger and her diabetes is not under control. 04/21/2015 -- I spoke to her PCP Dr. Candiss Norse and discussed the management which would include being seen by a general surgeon for debridement and taking multiple punch biopsies which would help in establishing the diagnosis of this is a vasculitis. She is agreeable about this and will set her up for the procedure with Dr. Tamala Julian at [facility]. She was seen by the surgeon Dr. Jamal Collin. 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 She is scheduled to have laser ablation done next week.Pt was sent here for possible biopsy to look for  vasculitis. Feel it would be better to wait after laser ablation is completed- the ulcer may heal fully and biopsy may not be necessary 04/29/2015 -- she had the venous ablation done by Dr. Lucky Cowboy last Friday and we do not have any notes yet. She is doing fine otherwise. 05/06/2015 --Review of her recent vascular intervention shows that she was seen by Dr. Lucky Cowboy on 04/29/2015. The follow-up duplex which was done showed that both the great saphenous vein and the small saphenous vein remained patent with reflux consistent with an unsuccessful ablation. He has rescheduled her for another the endovenous ablation to be done in about 4 weekso time. 05/13/2015 -- he was seen by her surgeon Dr. Jamal Collin who asked her to continue with conservative therapy and he would speak to Dr. Lucky Cowboy about her management. Dr. Lucky Cowboy is going to schedule her surgery in the middle of August for a repeat endovenous ablation. Her pus culture from last week has grown : Komatke her noted her sensitivity report but due to her multiple allergies I had tried clindamycin and she developed a rash with this too. She has been prescribed and anti-buttocks in the ER and is has it at home and she will let is know what she is going to be taking. 05/20/2015 -- she has developed a small spot on her right lower extremity but besides that it is not a full fledged ulceration. She did not get to see Dr. Lucky Cowboy last week and hopefully she will see him in the near future. 05/27/2015 -she is still awaiting her  appointment with Dr.Dew and her vascular procedure is not scheduled until August 19. She will be seeing her PCP tomorrow and I have asked her to convey our discussion so that she is aware that debridement has not been done yet. 06/03/2015 -- was seen by her rheumatologist Dr. Dorthula Matas, who has been treating her for temporal arteritis and  in his note has mentioned the possibility of vasculitis or pyoderma gangrenosum. He is lowering her prednisone to 12-1/2 mg for 1 month and then 10 mg per the next month. I will again make an attempt to speak to her PCP Dr. Candiss Norse and her surgeon Dr. 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 06/17/2015 -- Dr.Dew did her vascular procedure last week and a follow-up venous ultrasound shows good resolution of the veins as per the patient's history. He is to see her back in 2 weeks. [name], [name] ([id number]) 07/07/2015. -- the patient has had a heavy growth of Proteus mirabilis and Enterococcus faecalis. These are sensitive to several drugs but the problem is she has allergies to all of these and hence I would like her to see Dr. Ola Spurr for this. She is also due to see Dr. Lucky Cowboy tomorrow and I will discussed the management with him including debridement under anesthesia and possible biopsies. 07/14/2015 -- she has an appointment to see Dr. Ola Spurr tomorrow and did see Dr. Bunnie Domino PA who will discuss my request with him. 07/21/2015 -- saw Dr. Ola Spurr was able to do a test on her and has put her on amoxicillin. She has been tolerating that and has had no problems with allergies to this. 07/28/2015 -- Last Friday I spoke to Dr. Leotis Pain regarding her care and he said that her right leg did not need any surgery and on the left leg was doing pretty good. We did agree that if she undergoes any procedure in the future he would do a couple of punch biopsies of the wound. 08/18/2015 -- her right leg is very tender and there is significant amount of slough. The left leg is looking much better 09/02/2015 -- she is going to have a debridement and punch biopsies of her right lower extremity by Dr. Leotis Pain this coming Thursday. Also seen Dr. Ola Spurr who has continued her on  ciprofloxacin. 09/09/2015 -- on 09/04/2015 Dr. Leotis Pain took her to surgery - Irrigation and excisional debridement of skin, soft tissue, and muscle to about 40 cm2 to the right posterior calf with biopsy. Pathology results are -- DIAGNOSIS: SKIN, RIGHT LOWER EXTREMITY; BIOPSY: SKIN AND SOFT TISSUE WITH ULCERATION, SEE NOTE. - NEGATIVE FOR DYSPLASIA AND MALIGNANCY. Note: There is acute inflammation and ulceration of the epidermis. The dermis shows nonspecific inflammation, neovascularization, and hemosiderin deposition. The differential diagnosis for these findings includes stasis dermatitis, nonspecific dermatitis, and infection. Correlation with clinical findings is required. A PAS fungal stain is obtained and results will be reported in an addendum. cultures were also sent and this grew Pseudomonas aeruginosa, Escherichia coli, Proteus mirabilis, Klebsiella pneumoniae and they were all sensitive to ciprofloxacin which she is on. 09/16/2015 -- he saw Dr. 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 He is going to call the patient in and discuss with her the management possibly with Imuran. I will continue treating  her locally. 09/23/2015 -- she saw Dr. Ola Spurr today who was going to continue the antibiotics for now and stop after this course. She has an appointment to see Dr. Jefm Bryant in about a week's time. Her wound VAC has arrived but she did not bring it with her today. We will try and set her up for changes to the right leg 3 times a week. She is here for her first application of Apligraf to the left lower extremity. 09/30/2015 -- she has seen Dr. 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 because she is ambulatory at home health will not apply a wound VAC and she will have  to come here 3 times a week on Monday Wednesday and Friday. 10/10/2015 -- she is here for a second application of Apligraf to her left lower extremity. 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 Jefm Bryant. Other than that she is doing well 10/31/2015 -- she is here for a third application of Apligraf to her left lower extremity. after reviewing her wound on the right side I noted that it is granulating extremely well and we will use the remnants of the Apligraf on the right leg. LAVANYA, MINCE ([id number]) 11/14/2015 -- she was recently admitted to the hospital between January 11 and 11/10/2015 for uncontrolled hypertension, diabetes mellitus, acute kidney injury. during her admission she was supported by wound care and also by antibiotics. Around this time her immunosuppression was stopped by Dr. Nunzio Cory. She is feeling much better now. 11/21/2015 -- she has had her fourth application of Apligraf today and it has been shared between the left and the right leg 11/24/2015 -- was here for a wound VAC change but it was noticed that she had profuse bleeding from the lobe medial part of her right lower extremity and I was called in to evaluate her. 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 She has been doing fine since then 12/19/2015 -- she has finished 5 applications of Apligraf to the left lower extremity and this is doing great. He began with pain and swelling of her right ankle a couple of days ago and it has caused her much distress. She does not have any fever or any change in her general health. 12/22/2015 -- the patient's pain and swelling of the right lower extremity has resolved over the weekend. The left lower extremity is completely healed. She has had the first application of Apligraf on the right leg today. 01/02/2016 -- She is  here for the second application of Apligraf on the right leg today. 01/19/2016 -- she is here for the third application of Apligraf to the right posterior lateral leg. 02/02/2016 -- today she has had her fourth application of Apligraf and we have applied some of it to the right posterior lateral leg and the remaining waist we have used for the left anterior medial leg. 02/12/2016 -- today she is use the fifth application of Apligraf to the right posterior lateral leg. The remaining to be wasted has been applied to the left anterior medial leg. 03/01/2016 -- her copayment for the Apligraf is about $300 apiece and she's been to talk to her family to see if she can afford it. Electronic Signature(s) Signed: 03/01/2016 11:11:49 AM By: [name], [name] Entered By: Con Memos  Melbert Botelho on 03/01/2016 11:11:49 BRIANNA, AUDIBERT ([id number]) -------------------------------------------------------------------------------- Physical Exam Details Patient Name: TAELER, ALMONOR 03/01/2016 10:00 Date of Service: AM Medical Record [id number] Number: Patient Account Number: [id number] January 25, 1946 (70 y.o. Treating RN: [name] Date of Birth/Sex: Female) Other Clinician: Primary Care Physician: [facility], Delana Meyer Treating [name] Referring Physician: Glendon Axe Physician/Extender: Weeks in Treatment: 56 Constitutional . Pulse regular. Respirations normal and unlabored. Afebrile. . Eyes Nonicteric. Reactive to light. Ears, Nose, Mouth, and Throat Lips, teeth, and gums WNL.Marland Kitchen Moist mucosa without lesions. Neck supple and nontender. No palpable supraclavicular or cervical adenopathy. Normal sized without goiter. Respiratory WNL. No retractions.. Breath sounds WNL, No rubs, rales, rhonchi, or wheeze.. Cardiovascular Heart rhythm and rate regular, no murmur or gallop.. Pedal Pulses WNL. No clubbing, cyanosis or edema. Chest Breasts symmetical and no nipple discharge.. Breast tissue WNL, no masses,  lumps, or tenderness.. Lymphatic No adneopathy. No adenopathy. No adenopathy. Musculoskeletal Adexa without tenderness or enlargement.. Digits and nails w/o clubbing, cyanosis, infection, petechiae, ischemia, or inflammatory conditions.. Integumentary (Hair, Skin) No suspicious lesions. No crepitus or fluctuance. No peri-wound warmth or erythema. No masses.Marland Kitchen Psychiatric Judgement and insight Intact.. No evidence of depression, anxiety, or agitation.. Notes the right posterior lateral wound has minimal hyper granulation tissue which bleeds briskly on touch and I have used silver nitrate stick to control this. The anterior wound is looking clean but we have no Apligraf applied today. I have recommended using Hydrofera Blue to both the wounds Electronic Signature(s) Signed: 03/01/2016 11:12:38 AM By: [name], [name] Entered By: [name] on 03/01/2016 11:12:38 DEMITRIA, KLINGLESMITH ([id number]) -------------------------------------------------------------------------------- Physician Orders Details Patient Name: BRETTANY, TYRRELL D. 03/01/2016 10:00 Date of Service: AM Medical Record [id number] Number: Patient Account Number: [id number] February 11, 1946 (70 y.o. Treating RN: Ahmed Prima Date of Birth/Sex: Female) Other Clinician: Primary Care Physician: [facility], [name] Treating [name] Referring Physician: Glendon Axe Physician/Extender: Weeks in Treatment: 53 Verbal / Phone Orders: Yes Clinician: Carolyne Fiscal, Debi Read Back and Verified: Yes Diagnosis Coding Wound Cleansing Wound #5 Right,Posterior Lower Leg o May shower with protection. Wound #6 Right,Medial Lower Leg o May shower with protection. Anesthetic Wound #5 Right,Posterior Lower Leg o Topical Lidocaine 4% cream applied to wound bed prior to debridement Wound #6 Right,Medial Lower Leg o Topical Lidocaine 4% cream applied to wound bed prior to debridement Skin Barriers/Peri-Wound Care Wound #5  Right,Posterior Lower Leg o Skin Prep Wound #6 Right,Medial Lower Leg o Skin Prep Primary Wound Dressing Wound #5 Right,Posterior Lower Leg o Hydrafera Blue Wound #6 Right,Medial Lower Leg o Hydrafera Blue Secondary Dressing Wound #5 Right,Posterior Lower Leg o Boardered Foam Dressing Wound #6 Right,Medial Lower Leg o Boardered Foam Dressing Patti, Sharalee D. ([id number]) Dressing Change Frequency Wound #5 Right,Posterior Lower Leg o Change dressing every day. Wound #6 Right,Medial Lower Leg o Change dressing every other day. Follow-up Appointments Wound #5 Right,Posterior Lower Leg o Return Appointment in 1 week. Wound #6 Right,Medial Lower Leg o Return Appointment in 1 week. Edema Control Wound #5 Right,Posterior Lower Leg o Elevate legs to the level of the heart and pump ankles as often as possible o Other: - wear compression hose Wound #6 Right,Medial Lower Leg o Elevate legs to the level of the heart and pump ankles as often as possible o Other: - wear compression hose Electronic Signature(s) Signed: 03/01/2016 4:11:53 PM By: [name], [name] Signed: 03/03/2016 5:26:12 PM By: Alric Quan Entered By: Alric Quan on 03/01/2016 11:06:04 Willden, [name] D. ([id number]) -------------------------------------------------------------------------------- Problem List  Details Patient Name: MURRAY, VODA 03/01/2016 10:00 Date of Service: AM Medical Record [id number] Number: Patient Account Number: [id number] 1946-04-17 (70 y.o. Treating RN: [name] Date of Birth/Sex: Female) Other Clinician: Primary Care Physician: [facility], Delana Meyer Treating [name] Referring Physician: Glendon Axe Physician/Extender: Weeks in Treatment: 85 Active Problems ICD-10 Encounter Code Description Active Date Diagnosis E11.622 Type 2 diabetes mellitus with other skin ulcer 01/31/2015 Yes E66.01 Morbid (severe) obesity due to excess calories  01/31/2015 Yes I89.0 Lymphedema, not elsewhere classified 01/31/2015 Yes L97.212 Non-pressure chronic ulcer of right calf with fat layer 11/24/2015 Yes exposed I87.311 Chronic venous hypertension (idiopathic) with ulcer of 11/24/2015 Yes right lower extremity Inactive Problems Resolved Problems ICD-10 Code Description Active Date Resolved Date L97.322 Non-pressure chronic ulcer of left ankle with fat layer 01/31/2015 01/31/2015 exposed I83.222 Varicose veins of left lower extremity with both ulcer of 03/07/2015 03/07/2015 calf and inflammation I83.223 03/07/2015 03/07/2015 Scribner, Jillianne D. ([id number]) 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 L03.116 Cellulitis of left lower limb 07/07/2015 07/07/2015 Electronic Signature(s) Signed: 03/01/2016 11:11:00 AM By: [name], [name] Entered By: [name] on 03/01/2016 11:11:00 Bertoni, Tia Masker ([id number]) -------------------------------------------------------------------------------- Progress Note Details Patient Name: [name] 03/01/2016 10:00 Date of Service: AM Medical Record [id number] Number: Patient Account Number: [id number] [birth year] (70 y.o. Treating RN: [name] Date of Birth/Sex: Female) Other Clinician: Primary Care Physician: [facility], Delana Meyer Treating [name] Referring Physician: Glendon Axe Physician/Extender: Weeks in Treatment: 54 Subjective Chief Complaint Information obtained from Patient Patient presents to the wound care center for a consult due non healing wound 70 year old patient comes with a history of having a ulcer on the left lower extremity for the past 4 weeks. she says she's had swelling of both lower extremities for about a year after she started having prednisone. 02/07/2015 -- her vascular appointments obtained were in the first and third week of June. she is able to go to [place] and we will try and get her some earlier appointments. Other than  that nothing else has changed in her management. History of Present Illness (HPI) 70 year old patient who is known to have diabetes mellitus type 2, chronic renal insufficiency, coronary artery disease, hypertension, hypercholesterolemia, temporal arteritis and inflammatory arthritiss also has a history of having a hysterectomy and some orthopedic related surgeries. The ulcer on the left lower extremity started off as a blister and then. Got progressively worse. She does not have any fever or chills and has not had any recent surgical intervention for this. Her last hemoglobin A1c was 10.1 in September 2015. She has been recently put on doxycycline by her PCP. She is now also allergic to doxycycline and was this was changed over to Keflex. 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 She does see a cardiologist and also takes a diuretic. 02/07/2015 her arterial and venous duplex studies to be done have dates been given as the first and third week of June. This is at [facility]. We are going to try and get early appointments at [facility]. other than that nothing has changed in her management. 02/14/2015 -- we have been able to get her an appointment in [facility] on May 20 which is much earlier than her previous ones at [place]. She continues with her prednisone and her sugars are in the range of 150-200. 02/21/2015 We were able to  get a vascular lab workup for her today and she is going to be there at 2:00 this afternoon. the swelling of her leg has gone down significantly but she still has some tenderness over the wounds. 02/28/2015 - She has had one of two vascular workups done, and this coming Tuesday has another, at [place] [facility]. She continues to be on steroid medications. She has significant sensitivity in her left lower extremity and has pain suggestive  of neuropathic pain and I have asked her to address this with her primary care physician. 03/07/2015 -- The patient saw Dr. Lucky Cowboy for a consultation and he has had her arteries are okay but she has 2 incompetent veins on the left lower extremity and he is going to set her up for surgery. Official report is [name], [name] ([id number]) awaited. Addendum: Official reports are now available and on 03/04/2015. She was seen and lower extremity venous duplex exam was done. There was reflux present within the left greater saphenous vein below the knee and also the left small saphenous vein. Arterial duplex showed normal triphasic waveforms throughout the left lower extremity without any significant stenosis. Her ABIs were noncompressible bilaterally but a waveforms were normal and a digital pressures were normal bilaterally consistent with no significant arterial insufficiency. He has recommended endovenous ablation of both the left small saphenous and the left great saphenous vein. This would still be scheduled later. 03/14/2015 -- she has heard back from the vascular office and has surgery scheduled for sometime in July. Her rheumatologist has decreased her prednisone dosage but she still on it. She has also had cataract surgery in her right eye recently this week. 03/20/2015 - No new complaints today. Pain improved. No fever or chills. Tolerating 2 layer compression. 04/14/2015 -- she was doing very well today she went off on vacation and now her edema has increased markedly the ulceration is bigger and her diabetes is not under control. 04/21/2015 -- I spoke to her PCP Dr. Candiss Norse and discussed the management which would include being seen by a general surgeon for debridement and taking multiple punch biopsies which would help in establishing the diagnosis of this is a vasculitis. She is agreeable about this and will set her up for the procedure with Dr. Tamala Julian at [facility]. She  was seen by the surgeon Dr. Jamal Collin. 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 She is scheduled to have laser ablation done next week.Pt was sent here for possible biopsy to look for vasculitis. Feel it would be better to wait after laser ablation is completed- the ulcer may heal fully and biopsy may not be necessary 04/29/2015 -- she had the venous ablation done by Dr. Lucky Cowboy last Friday and we do not have any notes yet. She is doing fine otherwise. 05/06/2015 --Review of her recent vascular intervention shows that she was seen by Dr. Lucky Cowboy on 04/29/2015. The follow-up duplex which was done showed that both the great saphenous vein and the small saphenous vein remained patent with reflux consistent with an unsuccessful ablation. He has rescheduled her for another the endovenous ablation to be done in about 4 weeks time. 05/13/2015 -- he was seen by her surgeon Dr. Jamal Collin who asked her to continue with conservative therapy and he would speak to Dr. Lucky Cowboy about her management. Dr. Lucky Cowboy is going to schedule her surgery in the middle of August for a repeat  endovenous ablation. Her pus culture from last week has grown : Morgan her noted her sensitivity report but due to her multiple allergies I had tried clindamycin and she developed a rash with this too. She has been prescribed and anti-buttocks in the ER and is has it at home and she will let is know what she is going to be taking. 05/20/2015 -- she has developed a small spot on her right lower extremity but besides that it is not a full fledged ulceration. She did not get to see Dr. Lucky Cowboy last week and hopefully she will see him in the near future. 05/27/2015 -she is still awaiting her appointment with Dr.Dew and her vascular procedure is not scheduled until  August 19. She will be seeing her PCP tomorrow and I have asked her to convey our discussion so that she is aware that debridement has not been done yet. [name], [name] ([id number]) 06/03/2015 -- was seen by her rheumatologist Dr. 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 He is lowering her prednisone to 12-1/2 mg for 1 month and then 10 mg per the next month. I will again make an attempt to speak to her PCP Dr. Candiss Norse and her surgeon Dr. 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 06/17/2015 -- Dr.Dew did her vascular procedure last week and a follow-up venous ultrasound shows good resolution of the veins as per the patient's history. He is to see her back in 2 weeks. 07/07/2015. -- the patient has had a heavy growth of Proteus mirabilis and Enterococcus faecalis. These are sensitive to several drugs but the problem is she has allergies to all of these and hence I would like her to see Dr. Ola Spurr for this. She is also due to see Dr. Lucky Cowboy tomorrow and I will discussed the management with him including debridement under anesthesia and possible biopsies. 07/14/2015 -- she has an appointment to see Dr. Ola Spurr tomorrow and did see Dr. Bunnie Domino PA who will discuss my request with him. 07/21/2015 -- saw Dr. Ola Spurr was able to do a test on her and has put her on amoxicillin. She has been tolerating that and has had no problems with allergies to this. 07/28/2015 -- Last Friday I spoke to Dr. Leotis Pain regarding her care and he said that her right leg did not need any surgery and on the left leg was doing pretty good. We did agree that if she undergoes any procedure in the future he would do a couple of punch biopsies of the wound. 08/18/2015 -- her right leg is very tender and  there is significant amount of slough. The left leg is looking much better 09/02/2015 -- she is going to have a debridement and punch biopsies of her right lower extremity by Dr. Leotis Pain this coming Thursday. Also seen Dr. Ola Spurr who has continued her on ciprofloxacin. 09/09/2015 -- on 09/04/2015 Dr. Leotis Pain took her to surgery - Irrigation and excisional debridement of skin, soft tissue, and muscle to about 40 cm2 to the right posterior calf with biopsy. Pathology results are -- DIAGNOSIS: SKIN, RIGHT LOWER EXTREMITY; BIOPSY: SKIN AND SOFT TISSUE WITH ULCERATION, SEE NOTE. - NEGATIVE FOR DYSPLASIA AND MALIGNANCY. Note: There is acute inflammation and ulceration of the  epidermis. The dermis shows nonspecific inflammation, neovascularization, and hemosiderin deposition. The differential diagnosis for these findings includes stasis dermatitis, nonspecific dermatitis, and infection. Correlation with clinical findings is required. A PAS fungal stain is obtained and results will be reported in an addendum. cultures were also sent and this grew Pseudomonas aeruginosa, Escherichia coli, Proteus mirabilis, Klebsiella pneumoniae and they were all sensitive to ciprofloxacin which she is on. 09/16/2015 -- he saw Dr. 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 He is going to call the patient in and discuss with her the management possibly with Imuran. I will continue treating her locally. 09/23/2015 -- she saw Dr. Ola Spurr today who was going to continue the antibiotics for now and stop after this course. She has an appointment to see Dr. Jefm Bryant in about a week's time. Her wound VAC has arrived but she did not bring it with her today. We will try and set her up for changes to the right leg 3 times a week. She is here for her first application of Apligraf to the left lower  extremity. [name], [name] ([id number]) 09/30/2015 -- she has seen Dr. 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 because she is ambulatory at home health will not apply a wound VAC and she will have to come here 3 times a week on Monday Wednesday and Friday. 10/10/2015 -- she is here for a second application of Apligraf to her left lower extremity. 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 Jefm Bryant. Other than that she is doing well 10/31/2015 -- she is here for a third application of Apligraf to her left lower extremity. after reviewing her wound on the right side I noted that it is granulating extremely well and we will use the remnants of the Apligraf on the right leg. 11/14/2015 -- she was recently admitted to the hospital between January 11 and 11/10/2015 for uncontrolled hypertension, diabetes mellitus, acute kidney injury. during her admission she was supported by wound care and also by antibiotics. Around this time her immunosuppression was stopped by Dr. Nunzio Cory. She is feeling much better now. 11/21/2015 -- she has had her fourth application of Apligraf today and it has been shared between the left and the right leg 11/24/2015 -- was here for a wound VAC change but it was noticed that she had profuse bleeding from the lobe medial part of her right lower extremity and I was called in to evaluate her. 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 She has been doing fine since then 12/19/2015 -- she has finished 5 applications of Apligraf to the left lower extremity and this is doing great. He began with pain and swelling of her right ankle a couple of days ago and it has caused her much distress. She does not have any fever or any change in her  general health. 12/22/2015 -- the patient's pain and swelling of the right lower extremity has resolved over the weekend. The left lower extremity is completely healed. She has had the first application of Apligraf on the right leg today. 01/02/2016 -- She is here for the second application of Apligraf on the right leg today. 01/19/2016 -- she  is here for the third application of Apligraf to the right posterior lateral leg. 02/02/2016 -- today she has had her fourth application of Apligraf and we have applied some of it to the right posterior lateral leg and the remaining waist we have used for the left anterior medial leg. 02/12/2016 -- today she is use the fifth application of Apligraf to the right posterior lateral leg. The remaining to be wasted has been applied to the left anterior medial leg. 03/01/2016 -- her copayment for the Apligraf is about $300 apiece and she's been to talk to her family to see if she can afford it. Objective Constitutional Pulse regular. Respirations normal and unlabored. Afebrile. Vitals Time Taken: 10:37 AM, Height: 65 in, Weight: 248 lbs, BMI: 41.3, Temperature: 98.2 F, Pulse: 84 bpm, Respiratory Rate: 18 breaths/min, Blood Pressure: 170/62 mmHg. [name], [name] ([id number]) Eyes Nonicteric. Reactive to light. Ears, Nose, Mouth, and Throat Lips, teeth, and gums WNL.Marland Kitchen Moist mucosa without lesions. Neck supple and nontender. No palpable supraclavicular or cervical adenopathy. Normal sized without goiter. Respiratory WNL. No retractions.. Breath sounds WNL, No rubs, rales, rhonchi, or wheeze.. Cardiovascular Heart rhythm and rate regular, no murmur or gallop.. Pedal Pulses WNL. No clubbing, cyanosis or edema. Chest Breasts symmetical and no nipple discharge.. Breast tissue WNL, no masses, lumps, or tenderness.. Lymphatic No adneopathy. No adenopathy. No adenopathy. Musculoskeletal Adexa without tenderness or enlargement.. Digits and nails w/o clubbing,  cyanosis, infection, petechiae, ischemia, or inflammatory conditions.Marland Kitchen Psychiatric Judgement and insight Intact.. No evidence of depression, anxiety, or agitation.. General Notes: the right posterior lateral wound has minimal hyper granulation tissue which bleeds briskly on touch and I have used silver nitrate stick to control this. The anterior wound is looking clean but we have no Apligraf applied today. I have recommended using Hydrofera Blue to both the wounds Integumentary (Hair, Skin) No suspicious lesions. No crepitus or fluctuance. No peri-wound warmth or erythema. No masses.. Wound #5 status is Open. Original cause of wound was Gradually Appeared. The wound is located on the Right,Posterior Lower Leg. The wound measures 1cm length x 1.5cm width x 0.1cm depth; 1.178cm^2 area and 0.118cm^3 volume. The wound is limited to skin breakdown. There is no tunneling or undermining noted. There is a large amount of serosanguineous drainage noted. The wound margin is indistinct and nonvisible. There is large (67-100%) pink, pale granulation within the wound bed. There is a small (1-33%) amount of necrotic tissue within the wound bed including Adherent Slough. The periwound skin appearance exhibited: Localized Edema, Moist. The periwound skin appearance did not exhibit: Callus, Crepitus, Excoriation, Fluctuance, Friable, Induration, Rash, Scarring, Dry/Scaly, Maceration, Atrophie Blanche, Cyanosis, Ecchymosis, Hemosiderin Staining, Mottled, Pallor, Rubor, Erythema. Periwound temperature was noted as No Abnormality. Wound #6 status is Open. Original cause of wound was Gradually Appeared. The wound is located on the Right,Medial Lower Leg. The wound measures 5.4cm length x 3.9cm width x 0.1cm depth; 16.54cm^2 area [name], [name] ([id number]) and 1.654cm^3 volume. The wound is limited to skin breakdown. There is no tunneling or undermining noted. There is a medium amount of serous drainage noted. The  wound margin is flat and intact. There is large (67-100%) red granulation within the wound bed. There is a small (1-33%) amount of necrotic tissue within the wound bed including Eschar and Adherent Slough. The periwound skin appearance exhibited: Moist. The periwound skin appearance did not exhibit: Callus, Crepitus, Excoriation, Fluctuance, Friable, Induration, Localized Edema, Rash, Scarring, Dry/Scaly, Maceration, Atrophie Blanche, Cyanosis, Ecchymosis, Hemosiderin Staining, Mottled,  Pallor, Rubor, Erythema. Periwound temperature was noted as No Abnormality. The periwound has tenderness on palpation. 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 Wound #6 Right,Medial Lower Leg: May shower with protection. Anesthetic: Wound #5 Right,Posterior Lower Leg: Topical Lidocaine 4% cream applied to wound bed prior to debridement Wound #6 Right,Medial Lower Leg: Topical Lidocaine 4% cream applied to wound bed prior to debridement Skin Barriers/Peri-Wound Care: Wound #5 Right,Posterior Lower Leg: Skin Prep Wound #6 Right,Medial Lower Leg: Skin Prep Primary Wound Dressing: Wound #5 Right,Posterior Lower Leg: Hydrafera Blue Wound #6 Right,Medial Lower Leg: Hydrafera Blue [name], [name] ([id number]) Secondary Dressing: Wound #5 Right,Posterior Lower Leg: Boardered Foam Dressing Wound #6 Right,Medial Lower Leg: Boardered Foam Dressing Dressing Change Frequency: Wound #5 Right,Posterior Lower Leg: Change dressing every day. Wound #6 Right,Medial Lower Leg: Change dressing every other  day. Follow-up Appointments: Wound #5 Right,Posterior Lower Leg: Return Appointment in 1 week. Wound #6 Right,Medial Lower Leg: Return Appointment in 1 week. Edema Control: Wound #5 Right,Posterior Lower Leg: Elevate legs to the level of the heart and pump ankles as often as possible Other: - wear compression hose Wound #6 Right,Medial Lower Leg: Elevate legs to the level of the heart and pump ankles as often as possible Other: - wear compression hose I have recommended using Hydrofera Blue to both the wounds on alternate days and continue to use her compression stockings as before. We will await clarifications regarding her Apligraf and see her back next week. Electronic Signature(s) Signed: 03/01/2016 11:13:29 AM By: [name], [name] Entered By: [name] on 03/01/2016 11:13:29 [name], [name] ([id number]) -------------------------------------------------------------------------------- SuperBill Details Patient Name: [name], SOLORZANO D. Date of Service: 03/01/2016 Medical Record Number: [id number] Patient Account Number: [id number] Date of Birth/Sex: 1945-11-10 (70 y.o. Female) Treating RN: [name] Primary Care Physician: Glendon Axe Other Clinician: Referring Physician: Glendon Axe Treating Physician/Extender: Frann Rider in Treatment: 56 Diagnosis Coding ICD-10 Codes Code Description E11.622 Type 2 diabetes mellitus with other skin ulcer E66.01 Morbid (severe) obesity due to excess calories I89.0 Lymphedema, not elsewhere classified L97.212 Non-pressure chronic ulcer of right calf with fat layer exposed I87.311 Chronic venous hypertension (idiopathic) with ulcer of right lower extremity Facility Procedures CPT4 Code: [id number] Description: 99213 - WOUND CARE VISIT-LEV 3 EST PT Modifier: Quantity: 1 Physician Procedures CPT4 Code: [id number] Description: [id number] - WC PHYS LEVEL 3 - EST PT ICD-10 Description Diagnosis E11.622 Type 2 diabetes mellitus  with other skin ulcer E66.01 Morbid (severe) obesity due to excess calories I89.0 Lymphedema, not elsewhere classified L97.212 Non-pressure  chronic ulcer of right calf with fat Modifier: layer exposed Quantity: 1 Electronic Signature(s) Signed: 03/02/2016 5:07:53 PM By: [name], [name] Signed: 03/03/2016 5:26:12 PM By: Alric Quan Previous Signature: 03/01/2016 11:13:46 AM Version By: [name], [name] Entered By: Alric Quan on 03/02/2016 14:20:30

## 2016-03-08 ENCOUNTER — Encounter: Payer: Commercial Managed Care - HMO | Admitting: Surgery

## 2016-03-08 DIAGNOSIS — E11622 Type 2 diabetes mellitus with other skin ulcer: Secondary | ICD-10-CM | POA: Diagnosis not present

## 2016-03-09 NOTE — Progress Notes (Signed)
KADENCE, HOLGERSON ([id number]) Visit Report for 03/08/2016 Arrival Information Details Patient Name: [name], [name] Date of Service: 03/08/2016 1:30 PM Medical Record Number: [id number] Patient Account Number: [id number] Date of Birth/Sex: 12-04-1945 (70 y.o. Female) Treating RN: [name] Primary Care Physician: Glendon Axe Other Clinician: Referring Physician: Glendon Axe Treating Physician/Extender: Frann Rider in Treatment: 41 Visit Information History Since Last Visit Added or deleted any medications: No Patient Arrived: Ambulatory Any new allergies or adverse reactions: No Arrival Time: 13:45 Had a fall or experienced change in No Accompanied By: self activities of daily living that may affect Transfer Assistance: None risk of falls: Patient Identification Verified: Yes Signs or symptoms of abuse/neglect since last No Secondary Verification Process Yes visito Completed: Hospitalized since last visit: No Patient Requires Transmission- No Has Dressing in Place as Prescribed: Yes Based Precautions: Has Compression in Place as Prescribed: Yes Patient Has Alerts: Yes Pain Present Now: No Patient Alerts: Patient on Blood Thinner Electronic Signature(s) Signed: 03/09/2016 11:45:12 AM By: [name], RN, [name], [name], [name] Entered By: [name], RN, [name], Kim on 03/08/2016 13:45:24 Simko, Tia Masker ([id number]) -------------------------------------------------------------------------------- Encounter Discharge Information Details Patient Name: [name], [name] D. Date of Service: 03/08/2016 1:30 PM Medical Record Number: [id number] Patient Account Number: [id number] Date of Birth/Sex: 1945-11-24 (70 y.o. Female) Treating RN: [name] Primary Care Physician: Glendon Axe Other Clinician: Referring Physician: Glendon Axe Treating Physician/Extender: Frann Rider in Treatment: 31 Encounter Discharge Information Items Discharge Pain Level: 0 Discharge Condition:  Stable Ambulatory Status: Ambulatory Discharge Destination: Home Transportation: Private Auto Accompanied By: self Schedule Follow-up Appointment: Yes Medication Reconciliation completed and provided to Patient/Care Yes Demeka Sutter: Provided on Clinical Summary of Care: 03/08/2016 Form Type Recipient Paper Patient LM Electronic Signature(s) Signed: 03/08/2016 2:09:05 PM By: Ruthine Dose Entered By: Ruthine Dose on 03/08/2016 14:09:05 [name], [name] ([id number]) -------------------------------------------------------------------------------- Lower Extremity Assessment Details Patient Name: Kristin Stare D. Date of Service: 03/08/2016 1:30 PM Medical Record Number: [id number] Patient Account Number: [id number] Date of Birth/Sex: Feb 03, 1946 (70 y.o. Female) Treating RN: [name] Primary Care Physician: Glendon Axe Other Clinician: Referring Physician: Glendon Axe Treating Physician/Extender: Frann Rider in Treatment: 26 Vascular Assessment Pulses: Posterior Tibial Dorsalis Pedis Palpable: [Right:Yes] Extremity colors, hair growth, and conditions: Extremity Color: [Right:Hyperpigmented] Hair Growth on Extremity: [Right:No] Temperature of Extremity: [Right:Warm] Capillary Refill: [Right:< 3 seconds] Toe Nail Assessment Left: Right: Thick: No Discolored: No Electronic Signature(s) Signed: 03/09/2016 11:45:12 AM By: [name], RN, [name], [name], [name] Entered By: [name], RN, [name], Kim on 03/08/2016 13:48:28 [name], [name] ([id number]) -------------------------------------------------------------------------------- Multi Wound Chart Details Patient Name: [name], [name] D. Date of Service: 03/08/2016 1:30 PM Medical Record Number: [id number] Patient Account Number: [id number] Date of Birth/Sex: 07-Dec-1945 (70 y.o. Female) Treating RN: [name] Primary Care Physician: Glendon Axe Other Clinician: Referring Physician: Glendon Axe Treating Physician/Extender: Frann Rider in Treatment: 35 Vital Signs Height(in): 65 Pulse(bpm): 75 Weight(lbs): 248 Blood Pressure 167/69 (mmHg): Body Mass Index(BMI): 41 Temperature(F): 97.6 Respiratory Rate 18 (breaths/min): Photos: [5:No Photos] [6:No Photos] [N/A:N/A] Wound Location: [5:Right Lower Leg - Posterior] [6:Right Lower Leg - Medial N/A] Wounding Event: [5:Gradually Appeared] [6:Gradually Appeared] [N/A:N/A] Primary Etiology: [5:Venous Leg Ulcer] [6:Diabetic Wound/Ulcer of the Lower Extremity] [N/A:N/A] Comorbid History: [5:Cataracts, Asthma, Coronary Artery Disease, Coronary Artery Disease, Hypertension, Type II Diabetes, Osteoarthritis, Diabetes, Osteoarthritis, Neuropathy] [6:Cataracts, Asthma, Hypertension, Type II Neuropathy] [N/A:N/A] Date Acquired: [5:04/29/2015] [6:10/10/2015] [N/A:N/A] Weeks of Treatment: [5:41] [6:21] [N/A:N/A] Wound Status: [5:Open] [6:Open] [N/A:N/A] Measurements L x W x D 0.8x1.5x0.1 [  6:5.4x3.2x0.1] [N/A:N/A] (cm) Area (cm) : [5:0.942] [6:13.572] [N/A:N/A] Volume (cm) : [5:0.094] [6:1.357] [N/A:N/A] % Reduction in Area: [5:-401.10%] [6:-292.70%] [N/A:N/A] % Reduction in Volume: -394.70% [6:-292.20%] [N/A:N/A] Classification: [5:Full Thickness Without Exposed Support Structures] [6:Grade 1] [N/A:N/A] HBO Classification: [5:Grade 2] [6:N/A] [N/A:N/A] Exudate Amount: [5:Large] [6:Medium] [N/A:N/A] Exudate Type: [5:Serosanguineous] [6:Serous] [N/A:N/A] Exudate Color: [5:red, brown] [6:amber] [N/A:N/A] Wound Margin: [5:Indistinct, nonvisible] [6:Flat and Intact] [N/A:N/A] Granulation Amount: [5:Large (67-100%)] [6:Large (67-100%)] [N/A:N/A] Granulation Quality: [5:Pink, Hyper-granulation Red] [N/A:N/A] Necrotic Amount: Small (1-33%) Small (1-33%) N/A Exposed Structures: Fascia: No Fascia: No N/A Fat: No Fat: No Tendon: No Tendon: No Muscle: No Muscle: No Joint: No Joint: No Bone: No Bone: No Limited to Skin Limited to Skin Breakdown  Breakdown Epithelialization: None None N/A Periwound Skin Texture: Edema: Yes Edema: No N/A Excoriation: No Excoriation: No Induration: No Induration: No Callus: No Callus: No Crepitus: No Crepitus: No Fluctuance: No Fluctuance: No Friable: No Friable: No Rash: No Rash: No Scarring: No Scarring: No Periwound Skin Moist: Yes Moist: Yes N/A Moisture: Maceration: No Maceration: No Dry/Scaly: No Dry/Scaly: No Periwound Skin Color: Atrophie Blanche: No Atrophie Blanche: No N/A Cyanosis: No Cyanosis: No Ecchymosis: No Ecchymosis: No Erythema: No Erythema: No Hemosiderin Staining: No Hemosiderin Staining: No Mottled: No Mottled: No Pallor: No Pallor: No Rubor: No Rubor: No Temperature: No Abnormality No Abnormality N/A Tenderness on No Yes N/A Palpation: Wound Preparation: Ulcer Cleansing: Ulcer Cleansing: N/A Rinsed/Irrigated with Rinsed/Irrigated with Saline Saline Topical Anesthetic Topical Anesthetic Applied: Other: lidocaine Applied: Other: lidocaine 4% 4% Treatment Notes Electronic Signature(s) Signed: 03/09/2016 11:45:12 AM By: [name], RN, [name], [name], [name] Entered By: [name], RN, [name], Kim on 03/08/2016 13:52:57 Stepien, Tia Masker ([id number]) -------------------------------------------------------------------------------- Multi-Disciplinary Care Plan Details Patient Name: [name], [name] D. Date of Service: 03/08/2016 1:30 PM Medical Record Number: [id number] Patient Account Number: [id number] Date of Birth/Sex: 18-Nov-1945 (70 y.o. Female) Treating RN: [name] Primary Care Physician: Glendon Axe Other Clinician: Referring Physician: Glendon Axe Treating Physician/Extender: Frann Rider in Treatment: 36 Active Inactive Orientation to the Wound Care Program Nursing Diagnoses: Knowledge deficit related to the wound healing center program Goals: Patient/caregiver will verbalize understanding of the [facility] Date Initiated:  01/31/2015 Goal Status: Active Interventions: Provide education on orientation to the wound center Notes: Venous Leg Ulcer Nursing Diagnoses: Potential for venous Insuffiency (use before diagnosis confirmed) Goals: Non-invasive venous studies are completed as ordered Date Initiated: 01/31/2015 Goal Status: Active Patient/caregiver will verbalize understanding of disease process and disease management Date Initiated: 01/31/2015 Goal Status: Active Interventions: Assess peripheral edema status every visit. Notes: Wound/Skin Impairment Nursing Diagnoses: Impaired tissue integrity Knowledge deficit related to smoking impact on wound healing Etzkorn, Symantha D. ([id number]) Goals: Patient/caregiver will verbalize understanding of skin care regimen Date Initiated: 01/31/2015 Goal Status: Active Ulcer/skin breakdown will heal within 14 weeks Date Initiated: 01/31/2015 Goal Status: Active Interventions: Assess ulceration(s) every visit Notes: Electronic Signature(s) Signed: 03/09/2016 11:45:12 AM By: [name], RN, [name], [name], [name] Entered By: [name], RN, [name], Kim on 03/08/2016 13:52:49 Kau, Dayna D. ([id number]) -------------------------------------------------------------------------------- Pain Assessment Details Patient Name: [name], [name] D. Date of Service: 03/08/2016 1:30 PM Medical Record Number: [id number] Patient Account Number: [id number] Date of Birth/Sex: 04-29-1946 (70 y.o. Female) Treating RN: [name] Primary Care Physician: Glendon Axe Other Clinician: Referring Physician: Glendon Axe Treating Physician/Extender: Frann Rider in Treatment: 66 Active Problems Location of Pain Severity and Description of Pain Patient Has Paino No Site Locations Pain Management and Medication Current Pain Management: Electronic Signature(s)  Signed: 03/09/2016 11:45:12 AM By: [name], RN, [name], [name], [name] Entered By: [name], RN, [name], Kim on 03/08/2016 13:45:31 Pareja, Tia Masker  ([id number]) -------------------------------------------------------------------------------- Patient/Caregiver Education Details Patient Name: [name], [name] D. Date of Service: 03/08/2016 1:30 PM Medical Record Number: [id number] Patient Account Number: [id number] Date of Birth/Gender: 1946/03/22 (70 y.o. Female) Treating RN: [name] Primary Care Physician: Glendon Axe Other Clinician: Referring Physician: Glendon Axe Treating Physician/Extender: Frann Rider in Treatment: 42 Education Assessment Education Provided To: Patient Education Topics Provided Wound/Skin Impairment: Handouts: Caring for Your Ulcer, Other: continue wound care as prescribed Methods: Demonstration Responses: State content correctly Electronic Signature(s) Signed: 03/09/2016 11:45:12 AM By: [name], RN, [name], [name], [name] Entered By: [name], RN, [name], Kim on 03/08/2016 14:06:06 Thole, Coral DMarland Kitchen ([id number]) -------------------------------------------------------------------------------- Wound Assessment Details Patient Name: [name], [name] D. Date of Service: 03/08/2016 1:30 PM Medical Record Number: [id number] Patient Account Number: [id number] Date of Birth/Sex: May 11, 1946 (70 y.o. Female) Treating RN: [name] Primary Care Physician: Glendon Axe Other Clinician: Referring Physician: Glendon Axe Treating Physician/Extender: Frann Rider in Treatment: 57 Wound Status Wound Number: 5 Primary Venous Leg Ulcer Etiology: Wound Location: Right Lower Leg - Posterior Wound Open Wounding Event: Gradually Appeared Status: Date Acquired: 04/29/2015 Comorbid Cataracts, Asthma, Coronary Artery Weeks Of Treatment: 41 History: Disease, Hypertension, Type II Clustered Wound: No Diabetes, Osteoarthritis, Neuropathy Photos Photo Uploaded By: [name], RN, [name], Kim on 03/08/2016 14:49:24 Wound Measurements Length: (cm) 0.8 Width: (cm) 1.5 Depth: (cm) 0.1 Area: (cm) 0.942 Volume: (cm)  0.094 % Reduction in Area: -401.1% % Reduction in Volume: -394.7% Epithelialization: None Wound Description Full Thickness Without Exposed Classification: Support Structures Diabetic Severity Grade 2 (Wagner): Wound Margin: Indistinct, nonvisible Exudate Amount: Large Exudate Type: Serosanguineous Exudate Color: red, brown Foul Odor After Cleansing: No Wound Bed Granulation Amount: Large (67-100%) Exposed Structure Shingledecker, Kynzee D. ([id number]) Granulation Quality: Pink, Hyper-granulation Fascia Exposed: No Necrotic Amount: Small (1-33%) Fat Layer Exposed: No Necrotic Quality: Adherent Slough Tendon Exposed: No Muscle Exposed: No Joint Exposed: No Bone Exposed: No Limited to Skin Breakdown Periwound Skin Texture Texture Color No Abnormalities Noted: No No Abnormalities Noted: No Callus: No Atrophie Blanche: No Crepitus: No Cyanosis: No Excoriation: No Ecchymosis: No Fluctuance: No Erythema: No Friable: No Hemosiderin Staining: No Induration: No Mottled: No Localized Edema: Yes Pallor: No Rash: No Rubor: No Scarring: No Temperature / Pain Moisture Temperature: No Abnormality No Abnormalities Noted: No Dry / Scaly: No Maceration: No Moist: Yes Wound Preparation Ulcer Cleansing: Rinsed/Irrigated with Saline Topical Anesthetic Applied: Other: lidocaine 4%, Treatment Notes Wound #5 (Right, Posterior Lower Leg) 1. Cleansed with: Clean wound with Normal Saline 2. Anesthetic Topical Lidocaine 4% cream to wound bed prior to debridement 4. Dressing Applied: Other dressing (specify in notes) 5. Secondary Dressing Applied Bordered Foam Dressing 7. Secured with Patient to wear own compression stockings Notes RTD Electronic Signature(s) [name], [name] ([id number]) Signed: 03/09/2016 11:45:12 AM By: [name], RN, [name], [name], [name] Entered By: [name], RN, [name], Kim on 03/08/2016 13:51:39 Olsen, Tia Masker  ([id number]) -------------------------------------------------------------------------------- Wound Assessment Details Patient Name: [name], [name] D. Date of Service: 03/08/2016 1:30 PM Medical Record Number: [id number] Patient Account Number: [id number] Date of Birth/Sex: 01/27/1946 (70 y.o. Female) Treating RN: [name] Primary Care Physician: Glendon Axe Other Clinician: Referring Physician: Glendon Axe Treating Physician/Extender: Frann Rider in Treatment: 57 Wound Status Wound Number: 6 Primary Diabetic Wound/Ulcer of the Lower Etiology: Extremity Wound Location: Right Lower Leg - Medial Wound Open Wounding Event: Gradually Appeared Status:  Date Acquired: 10/10/2015 Comorbid Cataracts, Asthma, Coronary Artery Weeks Of Treatment: 21 History: Disease, Hypertension, Type II Clustered Wound: No Diabetes, Osteoarthritis, Neuropathy Photos Photo Uploaded By: [name], RN, [name], Kim on 03/08/2016 14:49:25 Wound Measurements Length: (cm) 5.4 % Reduction in Width: (cm) 3.2 % Reduction in Depth: (cm) 0.1 Epithelializati Area: (cm) 13.572 Volume: (cm) 1.357 Area: -292.7% Volume: -292.2% on: None Wound Description Classification: Grade 1 Wound Margin: Flat and Intact Exudate Amount: Medium Exudate Type: Serous Exudate Color: amber Foul Odor After Cleansing: No Wound Bed Granulation Amount: Large (67-100%) Exposed Structure Granulation Quality: Red Fascia Exposed: No Necrotic Amount: Small (1-33%) Fat Layer Exposed: No Necrotic Quality: Adherent Slough Tendon Exposed: No [name], [name] ([id number]) Muscle Exposed: No Joint Exposed: No Bone Exposed: No Limited to Skin Breakdown Periwound Skin Texture Texture Color No Abnormalities Noted: No No Abnormalities Noted: No Callus: No Atrophie Blanche: No Crepitus: No Cyanosis: No Excoriation: No Ecchymosis: No Fluctuance: No Erythema: No Friable: No Hemosiderin Staining: No Induration: No Mottled:  No Localized Edema: No Pallor: No Rash: No Rubor: No Scarring: No Temperature / Pain Moisture Temperature: No Abnormality No Abnormalities Noted: No Tenderness on Palpation: Yes Dry / Scaly: No Maceration: No Moist: Yes Wound Preparation Ulcer Cleansing: Rinsed/Irrigated with Saline Topical Anesthetic Applied: Other: lidocaine 4%, Treatment Notes Wound #6 (Right, Medial Lower Leg) 1. Cleansed with: Clean wound with Normal Saline 2. Anesthetic Topical Lidocaine 4% cream to wound bed prior to debridement 4. Dressing Applied: Other dressing (specify in notes) 5. Secondary Dressing Applied Bordered Foam Dressing 7. Secured with Patient to wear own compression stockings Notes RTD Electronic Signature(s) Signed: 03/09/2016 11:45:12 AM By: [name], RN, [name], [name], [name] Entered By: [name], RN, [name], Kim on 03/08/2016 13:51:58 [name], [name] HRISTOV ([id number]) Pryor, Sheron DMarland Kitchen ([id number]) -------------------------------------------------------------------------------- Vitals Details Patient Name: ZORRIA, CLAPSADDLE D. Date of Service: 03/08/2016 1:30 PM Medical Record Number: [id number] Patient Account Number: [id number] Date of Birth/Sex: [birth year] (70 y.o. Female) Treating RN: [name] Primary Care Physician: Glendon Axe Other Clinician: Referring Physician: Glendon Axe Treating Physician/Extender: Frann Rider in Treatment: 58 Vital Signs Time Taken: 13:45 Temperature (F): 97.6 Height (in): 65 Pulse (bpm): 75 Weight (lbs): 248 Respiratory Rate (breaths/min): 18 Body Mass Index (BMI): 41.3 Blood Pressure (mmHg): 167/69 Reference Range: 80 - 120 mg / dl Electronic Signature(s) Signed: 03/09/2016 11:45:12 AM By: [name], RN, [name], [name], [name] Entered By: [name], RN, [name], Kim on 03/08/2016 13:45:50

## 2016-03-09 NOTE — Progress Notes (Signed)
[name] ([id number]) Visit Report for 03/08/2016 Chief Complaint Document Details Patient Name: [name], [name] 03/08/2016 1:30 Date of Service: PM Medical Record [id number] Number: Patient Account Number: [id number] 1946/06/15 (70 y.o. Treating RN: [name] Date of Birth/Sex: Female) Other Clinician: Primary Care Physician: [facility], Delana Meyer Treating [name] Referring Physician: Glendon Axe Physician/Extender: Weeks in Treatment: 16 Information Obtained from: Patient Chief Complaint Patient presents to the wound care center for a consult due non healing wound 70 year old patient comes with a history of having a ulcer on the left lower extremity for the past 4 weeks. she says she's had swelling of both lower extremities for about a year after she started having prednisone. 02/07/2015 -- her vascular appointments obtained were in the first and third week of June. she is able to go to [place] and we will try and get her some earlier appointments. Other than that nothing else has changed in her management. Electronic Signature(s) Signed: 03/08/2016 2:01:51 PM By: [name], [name] Entered By: [name] on 03/08/2016 14:01:51 Sudduth, [name] ([id number]) -------------------------------------------------------------------------------- HPI Details Patient Name: [name], [name] 03/08/2016 1:30 Date of Service: PM Medical Record [id number] Number: Patient Account Number: [id number] September 17, 1946 (70 y.o. Treating RN: [name] Date of Birth/Sex: Female) Other Clinician: Primary Care Physician: [facility], Delana Meyer Treating Jyron Turman Referring Physician: Glendon Axe Physician/Extender: Weeks in Treatment: 79 History of Present Illness HPI Description: 70 year old patient who is known to have diabetes mellitus type 2, chronic renal insufficiency, coronary artery disease, hypertension, hypercholesterolemia, temporal arteritis and inflammatory arthritiss also  has a history of having a hysterectomy and some orthopedic related surgeries. The ulcer on the left lower extremity started off as a blister and then. Got progressively worse. She does not have any fever or chills and has not had any recent surgical intervention for this. Her last hemoglobin A1c was 10.1 in September 2015. She has been recently put on doxycycline by her PCP. She is now also allergic to doxycycline and was this was changed over to Keflex. 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 She does see a cardiologist and also takes a diuretic. 02/07/2015 her arterial and venous duplex studies to be done have dates been given as the first and third week of June. This is at [facility]. We are going to try and get early appointments at [facility]. other than that nothing has changed in her management. 02/14/2015 -- we have been able to get her an appointment in [facility] on May 20 which is much earlier than her previous ones at [place]. She continues with her prednisone and her sugars are in the range of 150-200. 02/21/2015 We were able to get a vascular lab workup for her today and she is going to be there at 2:00 this afternoon. the swelling of her leg has gone down significantly but she still has some tenderness over the wounds. 02/28/2015 - She has had one of two vascular workups done, and this coming Tuesday has another, at [facility]. She continues to be on steroid medications. She has significant sensitivity in her left lower extremity and has pain suggestive of neuropathic pain and I have asked her to address this with her primary care physician. 03/07/2015 -- The patient saw Dr. Lucky Cowboy for a consultation and he has had her arteries are okay but she has 2 incompetent veins on the  left lower extremity and he is going to set her up for surgery.  Official report is awaited. Addendum: Official reports are now available and on 03/04/2015. She was seen and lower extremity venous duplex exam was done. There was reflux present within the left greater saphenous vein below the knee and also the left small saphenous vein. Arterial duplex showed normal triphasic waveforms throughout the left lower extremity without any significant stenosis. Her ABIs were noncompressible bilaterally but a waveforms were normal and a digital pressures were normal bilaterally consistent with no significant arterial insufficiency. He has recommended endovenous ablation of both the left small saphenous and the left great saphenous vein. This would still be scheduled later. 03/14/2015 -- she has heard back from the vascular office and has surgery scheduled for sometime in July. [name], [name] ([id number]) Her rheumatologist has decreased her prednisone dosage but she still on it. She has also had cataract surgery in her right eye recently this week. 03/20/2015 - No new complaints today. Pain improved. No fever or chills. Tolerating 2 layer compression. 04/14/2015 -- she was doing very well today she went off on vacation and now her edema has increased markedly the ulceration is bigger and her diabetes is not under control. 04/21/2015 -- I spoke to her PCP Dr. Candiss Norse and discussed the management which would include being seen by a general surgeon for debridement and taking multiple punch biopsies which would help in establishing the diagnosis of this is a vasculitis. She is agreeable about this and will set her up for the procedure with Dr. Tamala Julian at [facility]. She was seen by the surgeon Dr. Jamal Collin. 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 She is scheduled to have laser ablation done next week.Pt was sent here for possible biopsy to look for  vasculitis. Feel it would be better to wait after laser ablation is completed- the ulcer may heal fully and biopsy may not be necessary 04/29/2015 -- she had the venous ablation done by Dr. Lucky Cowboy last Friday and we do not have any notes yet. She is doing fine otherwise. 05/06/2015 --Review of her recent vascular intervention shows that she was seen by Dr. Lucky Cowboy on 04/29/2015. The follow-up duplex which was done showed that both the great saphenous vein and the small saphenous vein remained patent with reflux consistent with an unsuccessful ablation. He has rescheduled her for another the endovenous ablation to be done in about 4 weekso time. 05/13/2015 -- he was seen by her surgeon Dr. Jamal Collin who asked her to continue with conservative therapy and he would speak to Dr. Lucky Cowboy about her management. Dr. Lucky Cowboy is going to schedule her surgery in the middle of August for a repeat endovenous ablation. Her pus culture from last week has grown : Wilmar her noted her sensitivity report but due to her multiple allergies I had tried clindamycin and she developed a rash with this too. She has been prescribed and anti-buttocks in the ER and is has it at home and she will let is know what she is going to be taking. 05/20/2015 -- she has developed a small spot on her right lower extremity but besides that it is not a full fledged ulceration. She did not get to see Dr. Lucky Cowboy last week and hopefully she will see him in the near future. 05/27/2015 -she is still awaiting her  appointment with Dr.Dew and her vascular procedure is not scheduled until August 19. She will be seeing her PCP tomorrow and I have asked her to convey our discussion so that she is aware that debridement has not been done yet. 06/03/2015 -- was seen by her rheumatologist Dr. Dorthula Matas, who has been treating her for temporal arteritis and  in his note has mentioned the possibility of vasculitis or pyoderma gangrenosum. He is lowering her prednisone to 12-1/2 mg for 1 month and then 10 mg per the next month. I will again make an attempt to speak to her PCP Dr. Candiss Norse and her surgeon Dr. 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 06/17/2015 -- Dr.Dew did her vascular procedure last week and a follow-up venous ultrasound shows good resolution of the veins as per the patient's history. He is to see her back in 2 weeks. [name], [name] ([id number]) 07/07/2015. -- the patient has had a heavy growth of Proteus mirabilis and Enterococcus faecalis. These are sensitive to several drugs but the problem is she has allergies to all of these and hence I would like her to see Dr. Ola Spurr for this. She is also due to see Dr. Lucky Cowboy tomorrow and I will discussed the management with him including debridement under anesthesia and possible biopsies. 07/14/2015 -- she has an appointment to see Dr. Ola Spurr tomorrow and did see Dr. Bunnie Domino PA who will discuss my request with him. 07/21/2015 -- saw Dr. Ola Spurr was able to do a test on her and has put her on amoxicillin. She has been tolerating that and has had no problems with allergies to this. 07/28/2015 -- Last Friday I spoke to Dr. Leotis Pain regarding her care and he said that her right leg did not need any surgery and on the left leg was doing pretty good. We did agree that if she undergoes any procedure in the future he would do a couple of punch biopsies of the wound. 08/18/2015 -- her right leg is very tender and there is significant amount of slough. The left leg is looking much better 09/02/2015 -- she is going to have a debridement and punch biopsies of her right lower extremity by Dr. Leotis Pain this coming Thursday. Also seen Dr. Ola Spurr who has continued her on  ciprofloxacin. 09/09/2015 -- on 09/04/2015 Dr. Leotis Pain took her to surgery - Irrigation and excisional debridement of skin, soft tissue, and muscle to about 40 cm2 to the right posterior calf with biopsy. Pathology results are -- DIAGNOSIS: SKIN, RIGHT LOWER EXTREMITY; BIOPSY: SKIN AND SOFT TISSUE WITH ULCERATION, SEE NOTE. - NEGATIVE FOR DYSPLASIA AND MALIGNANCY. Note: There is acute inflammation and ulceration of the epidermis. The dermis shows nonspecific inflammation, neovascularization, and hemosiderin deposition. The differential diagnosis for these findings includes stasis dermatitis, nonspecific dermatitis, and infection. Correlation with clinical findings is required. A PAS fungal stain is obtained and results will be reported in an addendum. cultures were also sent and this grew Pseudomonas aeruginosa, Escherichia coli, Proteus mirabilis, Klebsiella pneumoniae and they were all sensitive to ciprofloxacin which she is on. 09/16/2015 -- he saw Dr. 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 He is going to call the patient in and discuss with her the management possibly with Imuran. I will continue treating  her locally. 09/23/2015 -- she saw Dr. Ola Spurr today who was going to continue the antibiotics for now and stop after this course. She has an appointment to see Dr. Jefm Bryant in about a week's time. Her wound VAC has arrived but she did not bring it with her today. We will try and set her up for changes to the right leg 3 times a week. She is here for her first application of Apligraf to the left lower extremity. 09/30/2015 -- she has seen Dr. 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 because she is ambulatory at home health will not apply a wound VAC and she will have  to come here 3 times a week on Monday Wednesday and Friday. 10/10/2015 -- she is here for a second application of Apligraf to her left lower extremity. 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 Jefm Bryant. Other than that she is doing well 10/31/2015 -- she is here for a third application of Apligraf to her left lower extremity. after reviewing her wound on the right side I noted that it is granulating extremely well and we will use the remnants of the Apligraf on the right leg. JLISA, BRYS ([id number]) 11/14/2015 -- she was recently admitted to the hospital between January 11 and 11/10/2015 for uncontrolled hypertension, diabetes mellitus, acute kidney injury. during her admission she was supported by wound care and also by antibiotics. Around this time her immunosuppression was stopped by Dr. Nunzio Cory. She is feeling much better now. 11/21/2015 -- she has had her fourth application of Apligraf today and it has been shared between the left and the right leg 11/24/2015 -- was here for a wound VAC change but it was noticed that she had profuse bleeding from the lobe medial part of her right lower extremity and I was called in to evaluate her. 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 She has been doing fine since then 12/19/2015 -- she has finished 5 applications of Apligraf to the left lower extremity and this is doing great. He began with pain and swelling of her right ankle a couple of days ago and it has caused her much distress. She does not have any fever or any change in her general health. 12/22/2015 -- the patient's pain and swelling of the right lower extremity has resolved over the weekend. The left lower extremity is completely healed. She has had the first application of Apligraf on the right leg today. 01/02/2016 -- She is  here for the second application of Apligraf on the right leg today. 01/19/2016 -- she is here for the third application of Apligraf to the right posterior lateral leg. 02/02/2016 -- today she has had her fourth application of Apligraf and we have applied some of it to the right posterior lateral leg and the remaining waist we have used for the left anterior medial leg. 02/12/2016 -- today she is use the fifth application of Apligraf to the right posterior lateral leg. The remaining to be wasted has been applied to the left anterior medial leg. 03/01/2016 -- her copayment for the Apligraf is about $300 apiece and she's been to talk to her family to see if she can afford it. Electronic Signature(s) Signed: 03/08/2016 2:02:05 PM By: [name], [name] Entered By: Con Memos  Amandamarie Feggins on 03/08/2016 14:02:04 Noffsinger, LUCILE COCH ([id number]) -------------------------------------------------------------------------------- Otelia Sergeant TISS Details Patient Name: [name], [name] 03/08/2016 1:30 Date of Service: PM Medical Record [id number] Number: Patient Account Number: [id number] 1946/02/17 (70 y.o. Treating RN: [name] Date of Birth/Sex: Female) Other Clinician: Primary Care Physician: Glendon Axe Treating [name] Referring Physician: Glendon Axe Physician/Extender: Weeks in Treatment: 57 Procedure Performed for: Wound #5 Right,Posterior Lower Leg Performed By: Physician [name], MD Post Procedure Diagnosis Same as Pre-procedure Electronic Signature(s) Signed: 03/09/2016 11:45:12 AM By: [name] RN, [name], [name], [name] Entered By: [name], RN, [name], Kim on 03/08/2016 14:04:35 Mergen, CHANTIA GRATES ([id number]) -------------------------------------------------------------------------------- Physical Exam Details Patient Name: [name], [name] 03/08/2016 1:30 Date of Service: PM Medical Record [id number] Number: Patient Account Number: [id number] Feb 14, 1946 (70 y.o. Treating RN:  [name] Date of Birth/Sex: Female) Other Clinician: Primary Care Physician: [facility], Delana Meyer Treating [name] Referring Physician: Glendon Axe Physician/Extender: Weeks in Treatment: 57 Constitutional . Pulse regular. Respirations normal and unlabored. Afebrile. . Eyes Nonicteric. Reactive to light. Ears, Nose, Mouth, and Throat Lips, teeth, and gums WNL.Marland Kitchen Moist mucosa without lesions. Neck supple and nontender. No palpable supraclavicular or cervical adenopathy. Normal sized without goiter. Respiratory WNL. No retractions.. Cardiovascular Pedal Pulses WNL. No clubbing, cyanosis or edema. Chest Breasts symmetical and no nipple discharge.. Breast tissue WNL, no masses, lumps, or tenderness.. Lymphatic No adneopathy. No adenopathy. No adenopathy. Musculoskeletal Adexa without tenderness or enlargement.. Digits and nails w/o clubbing, cyanosis, infection, petechiae, ischemia, or inflammatory conditions.. Integumentary (Hair, Skin) No suspicious lesions. No crepitus or fluctuance. No peri-wound warmth or erythema. No masses.Marland Kitchen Psychiatric Judgement and insight Intact.. No evidence of depression, anxiety, or agitation.. Notes The anterior wound is looking clean and there is no debris to be removed sharply. The posterior wound continues to have some hyper granulation tissue which bleeds briskly on touch and it was cauterized with silver nitrate stick to control this. Electronic Signature(s) Signed: 03/08/2016 2:02:49 PM By: [name], [name] Entered By: [name] on 03/08/2016 14:02:48 [name], [name] ([id number]) -------------------------------------------------------------------------------- Physician Orders Details Patient Name: [name], [name] 03/08/2016 1:30 Date of Service: PM Medical Record [id number] Number: Patient Account Number: [id number] [birth year] (70 y.o. Treating RN: [name] Date of Birth/Sex: Female) Other Clinician: Primary Care  Physician: [facility], Delana Meyer Treating [name] Referring Physician: Glendon Axe Physician/Extender: Suella [place] in Treatment: 29 Verbal / Phone Orders: Yes Clinician: [name] Read Back and Verified: Yes Diagnosis Coding Wound Cleansing Wound #5 Right,Posterior Lower Leg o May shower with protection. Wound #6 Right,Medial Lower Leg o May shower with protection. Anesthetic Wound #5 Right,Posterior Lower Leg o Topical Lidocaine 4% cream applied to wound bed prior to debridement Wound #6 Right,Medial Lower Leg o Topical Lidocaine 4% cream applied to wound bed prior to debridement Skin Barriers/Peri-Wound Care Wound #5 Right,Posterior Lower Leg o Skin Prep Wound #6 Right,Medial Lower Leg o Skin Prep Primary Wound Dressing Wound #5 Right,Posterior Lower Leg o Other: - RTD Wound #6 Right,Medial Lower Leg o Other: - RTD Secondary Dressing Wound #5 Right,Posterior Lower Leg o Boardered Foam Dressing Wound #6 Right,Medial Lower Leg o Boardered Foam Dressing Bradt, Annamary D. ([id number]) Dressing Change Frequency Wound #5 Right,Posterior Lower Leg o Change dressing every day. Wound #6 Right,Medial Lower Leg o Change dressing every other day. Follow-up Appointments Wound #5 Right,Posterior Lower Leg o Return Appointment in 1 week. Wound #6 Right,Medial Lower Leg o Return Appointment in 1 week. Edema Control Wound #5 Right,Posterior Lower Leg o Elevate legs  to the level of the heart and pump ankles as often as possible o Other: - wear compression hose Wound #6 Right,Medial Lower Leg o Elevate legs to the level of the heart and pump ankles as often as possible o Other: - wear compression hose Electronic Signature(s) Signed: 03/08/2016 3:35:28 PM By: [name], [name] Signed: 03/09/2016 11:45:12 AM By: [name], RN, [name], [name], [name] Entered By: [name], RN, [name], Kim on 03/08/2016 13:59:24 Pingree, JACLYNN ZELLE  ([id number]) -------------------------------------------------------------------------------- Problem List Details Patient Name: [name], [name] 03/08/2016 1:30 Date of Service: PM Medical Record [id number] Number: Patient Account Number: [id number] 05/10/1946 (70 y.o. Treating RN: [name] Date of Birth/Sex: Female) Other Clinician: Primary Care Physician: [facility], Delana Meyer Treating [name] Referring Physician: Glendon Axe Physician/Extender: Weeks in Treatment: 3 Active Problems ICD-10 Encounter Code Description Active Date Diagnosis E11.622 Type 2 diabetes mellitus with other skin ulcer 01/31/2015 Yes E66.01 Morbid (severe) obesity due to excess calories 01/31/2015 Yes I89.0 Lymphedema, not elsewhere classified 01/31/2015 Yes L97.212 Non-pressure chronic ulcer of right calf with fat layer 11/24/2015 Yes exposed I87.311 Chronic venous hypertension (idiopathic) with ulcer of 11/24/2015 Yes right lower extremity Inactive Problems Resolved Problems ICD-10 Code Description Active Date Resolved Date L97.322 Non-pressure chronic ulcer of left ankle with fat layer 01/31/2015 01/31/2015 exposed I83.222 Varicose veins of left lower extremity with both ulcer of 03/07/2015 03/07/2015 calf and inflammation I83.223 03/07/2015 03/07/2015 Michels, Taleah D. ([id number]) 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 L03.116 Cellulitis of left lower limb 07/07/2015 07/07/2015 Electronic Signature(s) Signed: 03/08/2016 2:01:43 PM By: [name], [name] Entered By: [name] on 03/08/2016 14:01:43 Bantz, Banita DMarland Kitchen ([id number]) -------------------------------------------------------------------------------- Progress Note Details Patient Name: Kristin Stare D. 03/08/2016 1:30 Date of Service: PM Medical Record [id number] Number: Patient Account Number: [id number] September 03, 1946 (70 y.o. Treating RN: [name] Date of Birth/Sex: Female) Other Clinician: Primary  Care Physician: [facility], Delana Meyer Treating [name] Referring Physician: Glendon Axe Physician/Extender: Weeks in Treatment: 73 Subjective Chief Complaint Information obtained from Patient Patient presents to the wound care center for a consult due non healing wound 70 year old patient comes with a history of having a ulcer on the left lower extremity for the past 4 weeks. she says she's had swelling of both lower extremities for about a year after she started having prednisone. 02/07/2015 -- her vascular appointments obtained were in the first and third week of June. she is able to go to [place] and we will try and get her some earlier appointments. Other than that nothing else has changed in her management. History of Present Illness (HPI) 70 year old patient who is known to have diabetes mellitus type 2, chronic renal insufficiency, coronary artery disease, hypertension, hypercholesterolemia, temporal arteritis and inflammatory arthritiss also has a history of having a hysterectomy and some orthopedic related surgeries. The ulcer on the left lower extremity started off as a blister and then. Got progressively worse. She does not have any fever or chills and has not had any recent surgical intervention for this. Her last hemoglobin A1c was 10.1 in September 2015. She has been recently put on doxycycline by her PCP. She is now also allergic to doxycycline and was this was changed over to Keflex. 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 She does see a cardiologist and also takes a diuretic. 02/07/2015 her arterial and venous duplex studies to  be done have dates been given as the first and third week of June. This is at [facility] We are going to try and get early appointments at [facility]. other than that nothing has changed in her management. 02/14/2015 -- we have  been able to get her an appointment in [facility] on May 20 which is much earlier than her previous ones at [place]. She continues with her prednisone and her sugars are in the range of 150-200. 02/21/2015 We were able to get a vascular lab workup for her today and she is going to be there at 2:00 this afternoon. the swelling of her leg has gone down significantly but she still has some tenderness over the wounds. 02/28/2015 - She has had one of two vascular workups done, and this coming Tuesday has another, at [facility]. She continues to be on steroid medications. She has significant sensitivity in her left lower extremity and has pain suggestive of neuropathic pain and I have asked her to address this with her primary care physician. 03/07/2015 -- The patient saw Dr. Lucky Cowboy for a consultation and he has had her arteries are okay but she has 2 incompetent veins on the left lower extremity and he is going to set her up for surgery. Official report is LURANA, KOERNER ([id number]) awaited. Addendum: Official reports are now available and on 03/04/2015. She was seen and lower extremity venous duplex exam was done. There was reflux present within the left greater saphenous vein below the knee and also the left small saphenous vein. Arterial duplex showed normal triphasic waveforms throughout the left lower extremity without any significant stenosis. Her ABIs were noncompressible bilaterally but a waveforms were normal and a digital pressures were normal bilaterally consistent with no significant arterial insufficiency. He has recommended endovenous ablation of both the left small saphenous and the left great saphenous vein. This would still be scheduled later. 03/14/2015 -- she has heard back from the vascular office and has surgery scheduled for sometime in July. Her rheumatologist has decreased her prednisone dosage but she still on it. She has also had cataract surgery in  her right eye recently this week. 03/20/2015 - No new complaints today. Pain improved. No fever or chills. Tolerating 2 layer compression. 04/14/2015 -- she was doing very well today she went off on vacation and now her edema has increased markedly the ulceration is bigger and her diabetes is not under control. 04/21/2015 -- I spoke to her PCP Dr. Candiss Norse and discussed the management which would include being seen by a general surgeon for debridement and taking multiple punch biopsies which would help in establishing the diagnosis of this is a vasculitis. She is agreeable about this and will set her up for the procedure with Dr. Tamala Julian at [facility]. She was seen by the surgeon Dr. Jamal Collin. 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 She is scheduled to have laser ablation done next week.Pt was sent here for possible biopsy to look for vasculitis. Feel it would be better to wait after laser ablation is completed- the ulcer may heal fully and biopsy may not be necessary 04/29/2015 -- she had the venous ablation done by Dr. Lucky Cowboy last Friday and we do not have any notes yet. She is doing fine otherwise. 05/06/2015 --Review of her recent vascular intervention shows that she was seen by Dr. Lucky Cowboy on 04/29/2015. The follow-up duplex which  was done showed that both the great saphenous vein and the small saphenous vein remained patent with reflux consistent with an unsuccessful ablation. He has rescheduled her for another the endovenous ablation to be done in about 4 weeks time. 05/13/2015 -- he was seen by her surgeon Dr. Jamal Collin who asked her to continue with conservative therapy and he would speak to Dr. Lucky Cowboy about her management. Dr. Lucky Cowboy is going to schedule her surgery in the middle of August for a repeat endovenous ablation. Her pus culture from last week has grown : [place] her noted her sensitivity report but due to her multiple allergies I had tried clindamycin and she developed a rash with this too. She has been prescribed and anti-buttocks in the ER and is has it at home and she will let is know what she is going to be taking. 05/20/2015 -- she has developed a small spot on her right lower extremity but besides that it is not a full fledged ulceration. She did not get to see Dr. Lucky Cowboy last week and hopefully she will see him in the near future. 05/27/2015 -she is still awaiting her appointment with Dr.Dew and her vascular procedure is not scheduled until August 19. She will be seeing her PCP tomorrow and I have asked her to convey our discussion so that she is aware that debridement has not been done yet. [name], [name] ([id number]) 06/03/2015 -- was seen by her rheumatologist Dr. 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 He is lowering her prednisone to 12-1/2 mg for 1 month and then 10 mg per the next month. I will again make an attempt to speak to her PCP Dr. Candiss Norse and her surgeon Dr. 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 06/17/2015 -- Dr.Dew did her vascular procedure last week and a follow-up venous ultrasound shows good resolution of the veins as per the patient's history. He is to see her back in 2 weeks. 07/07/2015. -- the patient has had a heavy growth of Proteus mirabilis and Enterococcus faecalis. These are sensitive to several drugs but the problem is she has allergies to all of these and hence I would like her to see Dr. Ola Spurr for this. She is also due to see Dr. Lucky Cowboy tomorrow and I will discussed the management with him including  debridement under anesthesia and possible biopsies. 07/14/2015 -- she has an appointment to see Dr. Ola Spurr tomorrow and did see Dr. Bunnie Domino PA who will discuss my request with him. 07/21/2015 -- saw Dr. Ola Spurr was able to do a test on her and has put her on amoxicillin. She has been tolerating that and has had no problems with allergies to this. 07/28/2015 -- Last Friday I spoke to Dr. Leotis Pain regarding her care and he said that her right leg did not need any surgery and on the left leg was doing pretty good. We did agree that if she undergoes any procedure in the future he would do a couple of punch biopsies of the wound. 08/18/2015 -- her right leg is very tender and there is significant amount of slough. The left leg is looking much better 09/02/2015 -- she is going to have a debridement and punch biopsies of her right lower  extremity by Dr. Leotis Pain this coming Thursday. Also seen Dr. Ola Spurr who has continued her on ciprofloxacin. 09/09/2015 -- on 09/04/2015 Dr. Leotis Pain took her to surgery - Irrigation and excisional debridement of skin, soft tissue, and muscle to about 40 cm2 to the right posterior calf with biopsy. Pathology results are -- DIAGNOSIS: SKIN, RIGHT LOWER EXTREMITY; BIOPSY: SKIN AND SOFT TISSUE WITH ULCERATION, SEE NOTE. - NEGATIVE FOR DYSPLASIA AND MALIGNANCY. Note: There is acute inflammation and ulceration of the epidermis. The dermis shows nonspecific inflammation, neovascularization, and hemosiderin deposition. The differential diagnosis for these findings includes stasis dermatitis, nonspecific dermatitis, and infection. Correlation with clinical findings is required. A PAS fungal stain is obtained and results will be reported in an addendum. cultures were also sent and this grew Pseudomonas aeruginosa, Escherichia coli, Proteus mirabilis, Klebsiella pneumoniae and they were all sensitive to ciprofloxacin which she is on. 09/16/2015 -- he saw Dr. 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 He is going to call the patient in and discuss with her the management possibly with Imuran. I will continue treating her locally. 09/23/2015 -- she saw Dr. Ola Spurr today who was going to continue the antibiotics for now and stop after this course. She has an appointment to see Dr. Jefm Bryant in about a week's time. Her wound VAC has arrived but she did not bring it with her today. We will try and set her up for changes to the right leg 3 times a week. She is here for her first application of Apligraf to the left lower extremity. [name], MARSALA ([id number]) 09/30/2015 -- she has seen Dr. 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 because she is ambulatory at home health will not apply a wound VAC and she will have to come here 3 times a week on Monday Wednesday and Friday. 10/10/2015 -- she is here for a second application of Apligraf to her left lower extremity. 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 Jefm Bryant. Other than that she is doing well 10/31/2015 -- she is here for a third application of Apligraf to her left lower extremity. after reviewing her wound on the right side I noted that it is granulating extremely well and we will use the remnants of the Apligraf on the right leg. 11/14/2015 -- she was recently admitted to the hospital between January 11 and 11/10/2015 for uncontrolled hypertension, diabetes mellitus, acute kidney injury. during her admission she was supported by wound care and also by antibiotics. Around this time her immunosuppression was stopped by Dr. Nunzio Cory. She is feeling much better now. 11/21/2015 -- she has had her fourth application of Apligraf today  and it has been shared between the left and the right leg 11/24/2015 -- was here for a wound VAC change but it was noticed that she had profuse bleeding from the lobe medial part of her right lower extremity and I was called in to evaluate her. 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 She has been doing fine since then 12/19/2015 -- she has finished 5 applications of Apligraf to the left lower extremity and this is doing great. He began with pain and swelling of  her right ankle a couple of days ago and it has caused her much distress. She does not have any fever or any change in her general health. 12/22/2015 -- the patient's pain and swelling of the right lower extremity has resolved over the weekend. The left lower extremity is completely healed. She has had the first application of Apligraf on the right leg today. 01/02/2016 -- She is here for the second application of Apligraf on the right leg today. 01/19/2016 -- she is here for the third application of Apligraf to the right posterior lateral leg. 02/02/2016 -- today she has had her fourth application of Apligraf and we have applied some of it to the right posterior lateral leg and the remaining waist we have used for the left anterior medial leg. 02/12/2016 -- today she is use the fifth application of Apligraf to the right posterior lateral leg. The remaining to be wasted has been applied to the left anterior medial leg. 03/01/2016 -- her copayment for the Apligraf is about $300 apiece and she's been to talk to her family to see if she can afford it. Objective Constitutional Pulse regular. Respirations normal and unlabored. Afebrile. Vitals Time Taken: 1:45 PM, Height: 65 in, Weight: 248 lbs, BMI: 41.3, Temperature: 97.6 F, Pulse: 75 bpm, Respiratory Rate: 18 breaths/min, Blood Pressure: 167/69 mmHg. DORETHY, LALIBERTE D.  ([id number]) Eyes Nonicteric. Reactive to light. Ears, Nose, Mouth, and Throat Lips, teeth, and gums WNL.Marland Kitchen Moist mucosa without lesions. Neck supple and nontender. No palpable supraclavicular or cervical adenopathy. Normal sized without goiter. Respiratory WNL. No retractions.. Cardiovascular Pedal Pulses WNL. No clubbing, cyanosis or edema. Chest Breasts symmetical and no nipple discharge.. Breast tissue WNL, no masses, lumps, or tenderness.. Lymphatic No adneopathy. No adenopathy. No adenopathy. Musculoskeletal Adexa without tenderness or enlargement.. Digits and nails w/o clubbing, cyanosis, infection, petechiae, ischemia, or inflammatory conditions.Marland Kitchen Psychiatric Judgement and insight Intact.. No evidence of depression, anxiety, or agitation.. General Notes: The anterior wound is looking clean and there is no debris to be removed sharply. The posterior wound continues to have some hyper granulation tissue which bleeds briskly on touch and it was cauterized with silver nitrate stick to control this. Integumentary (Hair, Skin) No suspicious lesions. No crepitus or fluctuance. No peri-wound warmth or erythema. No masses.. Wound #5 status is Open. Original cause of wound was Gradually Appeared. The wound is located on the Right,Posterior Lower Leg. The wound measures 0.8cm length x 1.5cm width x 0.1cm depth; 0.942cm^2 area and 0.094cm^3 volume. The wound is limited to skin breakdown. There is a large amount of serosanguineous drainage noted. The wound margin is indistinct and nonvisible. There is large (67-100%) pink granulation within the wound bed. There is a small (1-33%) amount of necrotic tissue within the wound bed including Adherent Slough. The periwound skin appearance exhibited: Localized Edema, Moist. The periwound skin appearance did not exhibit: Callus, Crepitus, Excoriation, Fluctuance, Friable, Induration, Rash, Scarring, Dry/Scaly, Maceration, Atrophie Blanche, Cyanosis,  Ecchymosis, Hemosiderin Staining, Mottled, Pallor, Rubor, Erythema. Periwound temperature was noted as No Abnormality. Wound #6 status is Open. Original cause of wound was Gradually Appeared. The wound is located on the Right,Medial Lower Leg. The wound measures 5.4cm length x 3.2cm width x 0.1cm depth; 13.572cm^2 area and 1.357cm^3 volume. The wound is limited to skin breakdown. There is a medium amount of serous Baggerly, Abeni D. ([id number]) drainage noted. The wound margin is flat and intact. There is large (67-100%) red granulation within the wound bed. There is a small (1-33%) amount  of necrotic tissue within the wound bed including Adherent Slough. The periwound skin appearance exhibited: Moist. The periwound skin appearance did not exhibit: Callus, Crepitus, Excoriation, Fluctuance, Friable, Induration, Localized Edema, Rash, Scarring, Dry/Scaly, Maceration, Atrophie Blanche, Cyanosis, Ecchymosis, Hemosiderin Staining, Mottled, Pallor, Rubor, Erythema. Periwound temperature was noted as No Abnormality. The periwound has tenderness on palpation. 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 She will let us know next week if she can afford the copayment for Apligraf and we will order it accordingly. Procedures Wound #5 Wound #5 is a Venous Leg Ulcer located on the Right,Posterior Lower Leg . An CHEM CAUT GRANULATION TISS procedure was performed by [name], MD. Post procedure Diagnosis Wound #5: Same as Pre-Procedure Plan Wound Cleansing: Wound #5 Right,Posterior Lower Leg: May  shower with protection. KORISSA, SWEETIN ([id number]) Wound #6 Right,Medial Lower Leg: May shower with protection. Anesthetic: Wound #5 Right,Posterior Lower Leg: Topical Lidocaine 4% cream applied to wound bed prior to debridement Wound #6 Right,Medial Lower Leg: Topical Lidocaine 4% cream applied to wound bed prior to debridement Skin Barriers/Peri-Wound Care: Wound #5 Right,Posterior Lower Leg: Skin Prep Wound #6 Right,Medial Lower Leg: Skin Prep Primary Wound Dressing: Wound #5 Right,Posterior Lower Leg: Other: - RTD Wound #6 Right,Medial Lower Leg: Other: - RTD Secondary Dressing: Wound #5 Right,Posterior Lower Leg: Boardered Foam Dressing Wound #6 Right,Medial Lower Leg: Boardered Foam Dressing Dressing Change Frequency: Wound #5 Right,Posterior Lower Leg: Change dressing every day. Wound #6 Right,Medial Lower Leg: Change dressing every other day. Follow-up Appointments: Wound #5 Right,Posterior Lower Leg: Return Appointment in 1 week. Wound #6 Right,Medial Lower Leg: Return Appointment in 1 week. Edema Control: Wound #5 Right,Posterior Lower Leg: Elevate legs to the level of the heart and pump ankles as often as possible Other: - wear compression hose Wound #6 Right,Medial Lower Leg: Elevate legs to the level of the heart and pump ankles as often as possible Other: - wear compression hose We will continue with Hydrofera Blue or RTD which is available and use her compression stockings. She will let us know next week if she can afford the copayment for Apligraf and we will order it accordingly. ATIANA, MALICK ([id number]) Electronic Signature(s) Signed: 03/08/2016 3:36:46 PM By: [name], [name] Previous Signature: 03/08/2016 2:03:59 PM Version By: [name], [name] Entered By: [name] on 03/08/2016 15:36:46 Howlett, Chanci D. ([id number]) -------------------------------------------------------------------------------- SuperBill Details Patient Name:  SAKAYE, TEVES D. Date of Service: 03/08/2016 Medical Record Number: [id number] Patient Account Number: [id number] Date of Birth/Sex: [birth year] (70 y.o. Female) Treating RN: [name] Primary Care Physician: Glendon Axe Other Clinician: Referring Physician: Glendon Axe Treating Physician/Extender: Frann Rider in Treatment: 57 Diagnosis Coding ICD-10 Codes Code Description E11.622 Type 2 diabetes mellitus with other skin ulcer E66.01 Morbid (severe) obesity due to excess calories I89.0 Lymphedema, not elsewhere classified L97.212 Non-pressure chronic ulcer of right calf with fat layer exposed I87.311 Chronic venous hypertension (idiopathic) with ulcer of right lower extremity Facility Procedures CPT4 Code: [id number] Description: [id number] - WOUND CARE VISIT-LEV 2 EST PT Modifier: Quantity: 1 Physician Procedures CPT4 Code Description: [id number] - WC PHYS LEVEL 3 - EST  PT ICD-10 Description Diagnosis E11.622 Type 2 diabetes mellitus with other skin ulcer I89.0 Lymphedema, not elsewhere classified L97.212 Non-pressure chronic ulcer of right calf with fat la  I87.311 Chronic venous hypertension (idiopathic) with ulcer Modifier: yer exposed of right lower Quantity: 1 extremity Electronic Signature(s) Signed: 03/09/2016 11:45:12 AM By: [name], RN, [name], [name], [name] Previous Signature: 03/08/2016 2:04:48 PM Version By: [name], [name] Entered By: [name], RN, [name], Kim on 03/08/2016 16:13:48

## 2016-03-15 ENCOUNTER — Encounter: Payer: Commercial Managed Care - HMO | Admitting: Surgery

## 2016-03-15 DIAGNOSIS — E11622 Type 2 diabetes mellitus with other skin ulcer: Secondary | ICD-10-CM | POA: Diagnosis not present

## 2016-03-17 NOTE — Progress Notes (Addendum)
JAIMA, ADORNETTO ([id number]) Visit Report for 03/15/2016 Chief Complaint Document Details Patient Name: [name], [name] 03/15/2016 1:30 Date of Service: PM Medical Record [id number] Number: Patient Account Number: [id number] 03-Dec-1945 (70 y.o. Treating RN: [name] Date of Birth/Sex: Female) Other Clinician: Primary Care Physician: [facility], Delana Meyer Treating [name] Referring Physician: Glendon Axe Physician/Extender: Weeks in Treatment: 55 Information Obtained from: Patient Chief Complaint Patient presents to the wound care center for a consult due non healing wound 70 year old patient comes with a history of having a ulcer on the left lower extremity for the past 4 weeks. she says she's had swelling of both lower extremities for about a year after she started having prednisone. 02/07/2015 -- her vascular appointments obtained were in the first and third week of June. she is able to go to [place] and we will try and get her some earlier appointments. Other than that nothing else has changed in her management. Electronic Signature(s) Signed: 03/15/2016 2:46:44 PM By: [name], [name] Entered By: [name] on 03/15/2016 14:46:43 Breece, LAKAYSHA VICENS ([id number]) -------------------------------------------------------------------------------- Cellular or Tissue Based Product Details Patient Name: [name], [name] 03/15/2016 1:30 Date of Service: PM Medical Record [id number] Number: Patient Account Number: [id number] 10/29/1945 (70 y.o. Treating RN: Ahmed Prima Date of Birth/Sex: Female) Other Clinician: Primary Care Physician: [facility], Delana Meyer Treating [name] Referring Physician: Glendon Axe Physician/Extender: Weeks in Treatment: 58 Cellular or Tissue Based Wound #6 Right,Medial Lower Leg Product Type Applied to: Performed By: Physician [name], MD Cellular or Tissue Based Apligraf Product Type: Time-Out Taken: Yes Location: trunk / arms /  legs Wound Size (sq cm): 17.82 Product Size (sq cm): 44 Waste Size (sq cm): 26 Waste Reason: wound size Amount of Product Applied (sq cm): 18 Lot #: [id number] Expiration Date: 03/23/2016 Fenestrated: Yes Instrument: Blade Reconstituted: Yes Solution Type: SALINE Solution Amount: 2ML Lot #: [id number] Solution Expiration 11/25/2017 Date: Secured: Yes Secured With: Steri-Strips Dressing Applied: Yes Primary Dressing: MEPITEL Procedural Pain: 0 Post Procedural Pain: 0 Response to Treatment: Procedure was tolerated well Post Procedure Diagnosis Same as Pre-procedure Electronic Signature(s) Signed: 03/16/2016 4:50:53 PM By: Alric Quan Previous Signature: 03/15/2016 2:46:36 PM Version By: [name], [name] Mccary, Calayah D. ([id number]) Entered By: Alric Quan on 03/15/2016 16:00:41 Jenning, Tallie D. ([id number]) -------------------------------------------------------------------------------- HPI Details Patient Name: [name], [name] 03/15/2016 1:30 Date of Service: PM Medical Record [id number] Number: Patient Account Number: [id number] Nov 07, 1945 (70 y.o. Treating RN: [name] Date of Birth/Sex: Female) Other Clinician: Primary Care Physician: [facility], Delana Meyer Treating Mirha Brucato Referring Physician: Glendon Axe Physician/Extender: Weeks in Treatment: 64 History of Present Illness HPI Description: 70 year old patient who is known to have diabetes mellitus type 2, chronic renal insufficiency, coronary artery disease, hypertension, hypercholesterolemia, temporal arteritis and inflammatory arthritiss also has a history of having a hysterectomy and some orthopedic related surgeries. The ulcer on the left lower extremity started off as a blister and then. Got progressively worse. She does not have any fever or chills and has not had any recent surgical intervention for this. Her last hemoglobin A1c was 10.1 in September 2015. She has been recently put on  doxycycline by her PCP. She is now also allergic to doxycycline and was this was changed over to Keflex. 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 She does see a cardiologist and also takes a diuretic. 02/07/2015 her arterial and venous  duplex studies to be done have dates been given as the first and third week of June. This is at [facility]. We are going to try and get early appointments at [facility]. other than that nothing has changed in her management. 02/14/2015 -- we have been able to get her an appointment in [facility] on May 20 which is much earlier than her previous ones at [place]. She continues with her prednisone and her sugars are in the range of 150-200. 02/21/2015 We were able to get a vascular lab workup for her today and she is going to be there at 2:00 this afternoon. the swelling of her leg has gone down significantly but she still has some tenderness over the wounds. 02/28/2015 - She has had one of two vascular workups done, and this coming Tuesday has another, at [facility]. She continues to be on steroid medications. She has significant sensitivity in her left lower extremity and has pain suggestive of neuropathic pain and I have asked her to address this with her primary care physician. 03/07/2015 -- The patient saw Dr. Lucky Cowboy for a consultation and he has had her arteries are okay but she has 2 incompetent veins on the left lower extremity and he is going to set her up for surgery. Official report is awaited. Addendum: Official reports are now available and on 03/04/2015. She was seen and lower extremity venous duplex exam was done. There was reflux present within the left greater saphenous vein below the knee and also the left small saphenous vein. Arterial duplex showed normal triphasic waveforms throughout the left lower  extremity without any significant stenosis. Her ABIs were noncompressible bilaterally but a waveforms were normal and a digital pressures were normal bilaterally consistent with no significant arterial insufficiency. He has recommended endovenous ablation of both the left small saphenous and the left great saphenous vein. This would still be scheduled later. 03/14/2015 -- she has heard back from the vascular office and has surgery scheduled for sometime in July. [name], [name] ([id number]) Her rheumatologist has decreased her prednisone dosage but she still on it. She has also had cataract surgery in her right eye recently this week. 03/20/2015 - No new complaints today. Pain improved. No fever or chills. Tolerating 2 layer compression. 04/14/2015 -- she was doing very well today she went off on vacation and now her edema has increased markedly the ulceration is bigger and her diabetes is not under control. 04/21/2015 -- I spoke to her PCP Dr. Candiss Norse and discussed the management which would include being seen by a general surgeon for debridement and taking multiple punch biopsies which would help in establishing the diagnosis of this is a vasculitis. She is agreeable about this and will set her up for the procedure with Dr. Tamala Julian at [facility]. She was seen by the surgeon Dr. Jamal Collin. 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 She is scheduled to have laser ablation done next week.Pt was sent here for possible biopsy to look for vasculitis. Feel it would be better to wait after laser ablation is completed- the ulcer may heal fully and biopsy may not be necessary 04/29/2015 -- she had the venous ablation done by Dr. Lucky Cowboy last Friday and we do not have any notes yet. She is doing fine otherwise. 05/06/2015 --Review of her recent vascular intervention shows that she was seen by Dr.  Lucky Cowboy on 04/29/2015. The  follow-up duplex which was done showed that both the great saphenous vein and the small saphenous vein remained patent with reflux consistent with an unsuccessful ablation. He has rescheduled her for another the endovenous ablation to be done in about 4 weekso time. 05/13/2015 -- he was seen by her surgeon Dr. Jamal Collin who asked her to continue with conservative therapy and he would speak to Dr. Lucky Cowboy about her management. Dr. Lucky Cowboy is going to schedule her surgery in the middle of August for a repeat endovenous ablation. Her pus culture from last week has grown : Mucarabones her noted her sensitivity report but due to her multiple allergies I had tried clindamycin and she developed a rash with this too. She has been prescribed and anti-buttocks in the ER and is has it at home and she will let is know what she is going to be taking. 05/20/2015 -- she has developed a small spot on her right lower extremity but besides that it is not a full fledged ulceration. She did not get to see Dr. Lucky Cowboy last week and hopefully she will see him in the near future. 05/27/2015 -she is still awaiting her appointment with Dr.Dew and her vascular procedure is not scheduled until August 19. She will be seeing her PCP tomorrow and I have asked her to convey our discussion so that she is aware that debridement has not been done yet. 06/03/2015 -- was seen by her rheumatologist Dr. 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 He is lowering her prednisone to 12-1/2 mg for 1 month and then 10 mg per the next month. I will again make an attempt to speak to her PCP Dr. Candiss Norse and her surgeon Dr. Lucky Cowboy to see if he can organize for a debridement in the OR with multiple biopsies to establish a diagnosis of  vasculitis or pyoderma gangrenosum. 06/17/2015 -- Dr.Dew did her vascular procedure last week and a follow-up venous ultrasound shows good resolution of the veins as per the patient's history. He is to see her back in 2 weeks. [name], [name] ([id number]) 07/07/2015. -- the patient has had a heavy growth of Proteus mirabilis and Enterococcus faecalis. These are sensitive to several drugs but the problem is she has allergies to all of these and hence I would like her to see Dr. Ola Spurr for this. She is also due to see Dr. Lucky Cowboy tomorrow and I will discussed the management with him including debridement under anesthesia and possible biopsies. 07/14/2015 -- she has an appointment to see Dr. Ola Spurr tomorrow and did see Dr. Bunnie Domino PA who will discuss my request with him. 07/21/2015 -- saw Dr. Ola Spurr was able to do a test on her and has put her on amoxicillin. She has been tolerating that and has had no problems with allergies to this. 07/28/2015 -- Last Friday I spoke to Dr. Leotis Pain regarding her care and he said that her right leg did not need any surgery and on the left leg was doing pretty good. We did agree that if she undergoes any procedure in the future he would do a couple of punch biopsies of the wound. 08/18/2015 -- her right leg is very tender and there is significant amount of slough. The left leg is looking much better 09/02/2015 -- she is going to have a debridement and punch biopsies of her  right lower extremity by Dr. Leotis Pain this coming Thursday. Also seen Dr. Ola Spurr who has continued her on ciprofloxacin. 09/09/2015 -- on 09/04/2015 Dr. Leotis Pain took her to surgery - Irrigation and excisional debridement of skin, soft tissue, and muscle to about 40 cm2 to the right posterior calf with biopsy. Pathology results are -- DIAGNOSIS: SKIN, RIGHT LOWER EXTREMITY; BIOPSY: SKIN AND SOFT TISSUE WITH ULCERATION, SEE NOTE. - NEGATIVE FOR DYSPLASIA AND MALIGNANCY. Note: There  is acute inflammation and ulceration of the epidermis. The dermis shows nonspecific inflammation, neovascularization, and hemosiderin deposition. The differential diagnosis for these findings includes stasis dermatitis, nonspecific dermatitis, and infection. Correlation with clinical findings is required. A PAS fungal stain is obtained and results will be reported in an addendum. cultures were also sent and this grew Pseudomonas aeruginosa, Escherichia coli, Proteus mirabilis, Klebsiella pneumoniae and they were all sensitive to ciprofloxacin which she is on. 09/16/2015 -- he saw Dr. 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 He is going to call the patient in and discuss with her the management possibly with Imuran. I will continue treating her locally. 09/23/2015 -- she saw Dr. Ola Spurr today who was going to continue the antibiotics for now and stop after this course. She has an appointment to see Dr. Jefm Bryant in about a week's time. Her wound VAC has arrived but she did not bring it with her today. We will try and set her up for changes to the right leg 3 times a week. She is here for her first application of Apligraf to the left lower extremity. 09/30/2015 -- she has seen Dr. 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 because she is ambulatory at home health will not apply a wound VAC and she will have to come here 3 times a week on Monday Wednesday and Friday. 10/10/2015 -- she is here for a second application of Apligraf to her left lower extremity. 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 Jefm Bryant. Other than that she is doing well 10/31/2015 -- she is here for a third application of Apligraf to her left lower  extremity. after reviewing her wound on the right side I noted that it is granulating extremely well and we will use the remnants of the Apligraf on the right leg. ZOWIE, HOFFMEYER ([id number]) 11/14/2015 -- she was recently admitted to the hospital between January 11 and 11/10/2015 for uncontrolled hypertension, diabetes mellitus, acute kidney injury. during her admission she was supported by wound care and also by antibiotics. Around this time her immunosuppression was stopped by Dr. Nunzio Cory. She is feeling much better now. 11/21/2015 -- she has had her fourth application of Apligraf today and it has been shared between the left and the right leg 11/24/2015 -- was here for a wound VAC change but it was noticed that she had profuse bleeding from the lobe medial part of her right lower extremity and I was called in to evaluate her. 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 She has been doing fine since then 12/19/2015 -- she has finished 5 applications of Apligraf to the left lower extremity and this is doing great. He began with pain and  swelling of her right ankle a couple of days ago and it has caused her much distress. She does not have any fever or any change in her general health. 12/22/2015 -- the patient's pain and swelling of the right lower extremity has resolved over the weekend. The left lower extremity is completely healed. She has had the first application of Apligraf on the right leg today. 01/02/2016 -- She is here for the second application of Apligraf on the right leg today. 01/19/2016 -- she is here for the third application of Apligraf to the right posterior lateral leg. 02/02/2016 -- today she has had her fourth application of Apligraf and we have applied some of it to the right posterior lateral leg and the remaining waist we have used for the left anterior medial leg. 02/12/2016  -- today she is use the fifth application of Apligraf to the right posterior lateral leg. The remaining to be wasted has been applied to the left anterior medial leg. 03/01/2016 -- her copayment for the Apligraf is about $300 apiece and she's been to talk to her family to see if she can afford it. 03/15/2016 -- she was able to get some charity care to help with her copayment and we have applied the first Apligraf to her right anterior medial leg. Electronic Signature(s) Signed: 03/15/2016 2:47:28 PM By: [name], [name] Entered By: [name] on 03/15/2016 14:47:28 [name], [name] ([id number]) -------------------------------------------------------------------------------- Physical Exam Details Patient Name: [name], [name] 03/15/2016 1:30 Date of Service: PM Medical Record [id number] Number: Patient Account Number: [id number] 1946/03/16 (70 y.o. Treating RN: [name] Date of Birth/Sex: Female) Other Clinician: Primary Care Physician: [facility], Delana Meyer Treating [name] Referring Physician: Glendon Axe Physician/Extender: Weeks in Treatment: 58 Constitutional . Pulse regular. Respirations normal and unlabored. Afebrile. . Eyes Nonicteric. Reactive to light. Ears, Nose, Mouth, and Throat Lips, teeth, and gums WNL.Marland Kitchen Moist mucosa without lesions. Neck supple and nontender. No palpable supraclavicular or cervical adenopathy. Normal sized without goiter. Respiratory WNL. No retractions.. Breath sounds WNL, No rubs, rales, rhonchi, or wheeze.. Cardiovascular Heart rhythm and rate regular, no murmur or gallop.. Pedal Pulses WNL. No clubbing, cyanosis or edema. Chest Breasts symmetical and no nipple discharge.. Breast tissue WNL, no masses, lumps, or tenderness.. Lymphatic No adneopathy. No adenopathy. No adenopathy. Musculoskeletal Adexa without tenderness or enlargement.. Digits and nails w/o clubbing, cyanosis, infection, petechiae, ischemia, or inflammatory  conditions.. Integumentary (Hair, Skin) No suspicious lesions. No crepitus or fluctuance. No peri-wound warmth or erythema. No masses.Marland Kitchen Psychiatric Judgement and insight Intact.. No evidence of depression, anxiety, or agitation.. Notes the posterior wound has just a small area which is almost completely epithelialized and we were just dressed this with a bordered foam. There is no hyper granulation on this. The anterior medial wound is looking very clean and after the usual precautions we have applied Apligraf today. Electronic Signature(s) Signed: 03/15/2016 2:48:13 PM By: [name], [name] Entered By: [name] on 03/15/2016 14:48:13 Garin, COUA CAUDILL ([id number]) ALEECE, ZACARIAS ([id number]) -------------------------------------------------------------------------------- Physician Orders Details Patient Name: [name], [name] 03/15/2016 1:30 Date of Service: PM Medical Record [id number] Number: Patient Account Number: [id number] 1946-05-23 (70 y.o. Treating RN: Ahmed Prima Date of Birth/Sex: Female) Other Clinician: Primary Care Physician: [facility], [name] Treating [name] Referring Physician: Glendon Axe Physician/Extender: Weeks in Treatment: 73 Verbal / Phone Orders: Yes Clinician: Pinkerton, Debi Read Back and Verified: Yes Diagnosis Coding Wound Cleansing Wound #5 Right,Posterior Lower Leg o Clean wound with Normal Saline. Wound #6  Right,Medial Lower Leg o Clean wound with Normal Saline. Anesthetic Wound #5 Right,Posterior Lower Leg o Topical Lidocaine 4% cream applied to wound bed prior to debridement Wound #6 Right,Medial Lower Leg o Topical Lidocaine 4% cream applied to wound bed prior to debridement Skin Barriers/Peri-Wound Care Wound #5 Right,Posterior Lower Leg o Skin Prep Wound #6 Right,Medial Lower Leg o Skin Prep Primary Wound Dressing Wound #5 Right,Posterior Lower Leg o Foam Wound #6 Right,Medial Lower Leg o Other: -  Apligraf Secondary Dressing Wound #5 Right,Posterior Lower Leg o Dry Gauze o Conform/Kerlix Wound #6 Right,Medial Lower Leg Dottavio, Maryjo D. ([id number]) o Dry Gauze o Conform/Kerlix - mepitel, steri-strips Dressing Change Frequency Wound #5 Right,Posterior Lower Leg o Change dressing every week Wound #6 Right,Medial Lower Leg o Change dressing every week Follow-up Appointments Wound #5 Right,Posterior Lower Leg o Return Appointment in 1 week. Wound #6 Right,Medial Lower Leg o Return Appointment in 1 week. Edema Control Wound #5 Right,Posterior Lower Leg o Elevate legs to the level of the heart and pump ankles as often as possible o Other: - compression hose Wound #6 Right,Medial Lower Leg o Elevate legs to the level of the heart and pump ankles as often as possible o Other: - compression hose Advanced Therapies Wound #5 Right,Posterior Lower Leg o Apligraf application in clinic; including contact layer, fixation with steri strips, dry gauze and cover dressing. - Patient instructed to leave dressing in place till next visit. Not to get it wet. Wound #6 Right,Medial Lower Leg o Apligraf application in clinic; including contact layer, fixation with steri strips, dry gauze and cover dressing. - Patient instructed to leave dressing in place till next visit. Not to get it wet. Electronic Signature(s) Signed: 03/15/2016 3:39:56 PM By: [name], [name] Signed: 03/16/2016 4:50:53 PM By: Alric Quan Entered By: Alric Quan on 03/15/2016 14:34:16 [name], [name] ([id number]) -------------------------------------------------------------------------------- Problem List Details Patient Name: [name], [name] 03/15/2016 1:30 Date of Service: PM Medical Record [id number] Number: Patient Account Number: [id number] 1946-02-19 (70 y.o. Treating RN: [name] Date of Birth/Sex: Female) Other Clinician: Primary Care Physician: Glendon Axe  Treating [name] Referring Physician: Glendon Axe Physician/Extender: Weeks in Treatment: 52 Active Problems ICD-10 Encounter Code Description Active Date Diagnosis E11.622 Type 2 diabetes mellitus with other skin ulcer 01/31/2015 Yes E66.01 Morbid (severe) obesity due to excess calories 01/31/2015 Yes I89.0 Lymphedema, not elsewhere classified 01/31/2015 Yes L97.212 Non-pressure chronic ulcer of right calf with fat layer 11/24/2015 Yes exposed I87.311 Chronic venous hypertension (idiopathic) with ulcer of 11/24/2015 Yes right lower extremity Inactive Problems Resolved Problems ICD-10 Code Description Active Date Resolved Date L97.322 Non-pressure chronic ulcer of left ankle with fat layer 01/31/2015 01/31/2015 exposed I83.222 Varicose veins of left lower extremity with both ulcer of 03/07/2015 03/07/2015 calf and inflammation I83.223 03/07/2015 03/07/2015 Ige, [name] ([id number]) 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 L03.116 Cellulitis of left lower limb 07/07/2015 07/07/2015 Electronic Signature(s) Signed: 03/15/2016 2:46:20 PM By: [name], [name] Entered By: [name] on 03/15/2016 14:46:19 [name], Brad DMarland Kitchen ([id number]) -------------------------------------------------------------------------------- Progress Note Details Patient Name: [name] 03/15/2016 1:30 Date of Service: PM Medical Record [id number] Number: Patient Account Number: [id number] 04-02-1946 (70 y.o. Treating RN: [name] Date of Birth/Sex: Female) Other Clinician: Primary Care Physician: [facility], Delana Meyer Treating [name] Referring Physician: Glendon Axe Physician/Extender: Weeks in Treatment: 67 Subjective Chief Complaint Information obtained from Patient Patient presents to the wound care center for a consult due non healing  wound 70 year old patient comes with a history of having a ulcer on the left lower extremity for the past 4  weeks. she says she's had swelling of both lower extremities for about a year after she started having prednisone. 02/07/2015 -- her vascular appointments obtained were in the first and third week of June. she is able to go to [place] and we will try and get her some earlier appointments. Other than that nothing else has changed in her management. History of Present Illness (HPI) 69 year old patient who is known to have diabetes mellitus type 2, chronic renal insufficiency, coronary artery disease, hypertension, hypercholesterolemia, temporal arteritis and inflammatory arthritiss also has a history of having a hysterectomy and some orthopedic related surgeries. The ulcer on the left lower extremity started off as a blister and then. Got progressively worse. She does not have any fever or chills and has not had any recent surgical intervention for this. Her last hemoglobin A1c was 10.1 in September 2015. She has been recently put on doxycycline by her PCP. She is now also allergic to doxycycline and was this was changed over to Keflex. 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 She does see a cardiologist and also takes a diuretic. 02/07/2015 her arterial and venous duplex studies to be done have dates been given as the first and third week of June. This is at [facility]. We are going to try and get early appointments at [facility]. other than that nothing has changed in her management. 02/14/2015 -- we have been able to get her an appointment in [facility] on May 20 which is much earlier than her previous ones at [place]. She continues with her prednisone and her sugars are in the range of 150-200. 02/21/2015 We were able to get a vascular lab workup for her today and she is going to be there at 2:00 this afternoon. the swelling of her leg has gone down significantly but she  still has some tenderness over the wounds. 02/28/2015 - She has had one of two vascular workups done, and this coming Tuesday has another, at [facility]. She continues to be on steroid medications. She has significant sensitivity in her left lower extremity and has pain suggestive of neuropathic pain and I have asked her to address this with her primary care physician. 03/07/2015 -- The patient saw Dr. Lucky Cowboy for a consultation and he has had her arteries are okay but she has 2 incompetent veins on the left lower extremity and he is going to set her up for surgery. Official report is AINZLEY, PERNELL ([id number]) awaited. Addendum: Official reports are now available and on 03/04/2015. She was seen and lower extremity venous duplex exam was done. There was reflux present within the left greater saphenous vein below the knee and also the left small saphenous vein. Arterial duplex showed normal triphasic waveforms throughout the left lower extremity without any significant stenosis. Her ABIs were noncompressible bilaterally but a waveforms were normal and a digital pressures were normal bilaterally consistent with no significant arterial insufficiency. He has recommended endovenous ablation of both the left small saphenous and the left great saphenous vein. This would still be scheduled later. 03/14/2015 -- she has heard back from the vascular office and has surgery scheduled for sometime in July. Her rheumatologist has decreased her prednisone dosage but she still on it. She has  also had cataract surgery in her right eye recently this week. 03/20/2015 - No new complaints today. Pain improved. No fever or chills. Tolerating 2 layer compression. 04/14/2015 -- she was doing very well today she went off on vacation and now her edema has increased markedly the ulceration is bigger and her diabetes is not under control. 04/21/2015 -- I spoke to her PCP Dr. Candiss Norse and discussed the  management which would include being seen by a general surgeon for debridement and taking multiple punch biopsies which would help in establishing the diagnosis of this is a vasculitis. She is agreeable about this and will set her up for the procedure with Dr. Tamala Julian at [facility]. She was seen by the surgeon Dr. Jamal Collin. 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 She is scheduled to have laser ablation done next week.Pt was sent here for possible biopsy to look for vasculitis. Feel it would be better to wait after laser ablation is completed- the ulcer may heal fully and biopsy may not be necessary 04/29/2015 -- she had the venous ablation done by Dr. Lucky Cowboy last Friday and we do not have any notes yet. She is doing fine otherwise. 05/06/2015 --Review of her recent vascular intervention shows that she was seen by Dr. Lucky Cowboy on 04/29/2015. The follow-up duplex which was done showed that both the great saphenous vein and the small saphenous vein remained patent with reflux consistent with an unsuccessful ablation. He has rescheduled her for another the endovenous ablation to be done in about 4 weeks time. 05/13/2015 -- he was seen by her surgeon Dr. Jamal Collin who asked her to continue with conservative therapy and he would speak to Dr. Lucky Cowboy about her management. Dr. Lucky Cowboy is going to schedule her surgery in the middle of August for a repeat endovenous ablation. Her pus culture from last week has grown : Homer her noted her sensitivity report but due to her multiple allergies I had tried clindamycin and she developed a rash with this too. She has been prescribed and anti-buttocks in the ER and is has it at home and she will let is know what she is going to be taking. 05/20/2015 -- she has  developed a small spot on her right lower extremity but besides that it is not a full fledged ulceration. She did not get to see Dr. Lucky Cowboy last week and hopefully she will see him in the near future. 05/27/2015 -she is still awaiting her appointment with Dr.Dew and her vascular procedure is not scheduled until August 19. She will be seeing her PCP tomorrow and I have asked her to convey our discussion so that she is aware that debridement has not been done yet. ANANSA, PRUIETT ([id number]) 06/03/2015 -- was seen by her rheumatologist Dr. 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 He is lowering her prednisone to 12-1/2 mg for 1 month and then 10 mg per the next month. I will again make an attempt to speak to her PCP Dr. Candiss Norse and her surgeon Dr. 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 06/17/2015 -- Dr.Dew did her vascular procedure last week and a follow-up venous ultrasound shows good  resolution of the veins as per the patient's history. He is to see her back in 2 weeks. 07/07/2015. -- the patient has had a heavy growth of Proteus mirabilis and Enterococcus faecalis. These are sensitive to several drugs but the problem is she has allergies to all of these and hence I would like her to see Dr. Ola Spurr for this. She is also due to see Dr. Lucky Cowboy tomorrow and I will discussed the management with him including debridement under anesthesia and possible biopsies. 07/14/2015 -- she has an appointment to see Dr. Ola Spurr tomorrow and did see Dr. Bunnie Domino PA who will discuss my request with him. 07/21/2015 -- saw Dr. Ola Spurr was able to do a test on her and has put her on amoxicillin. She has been tolerating that and has had no problems with allergies to this. 07/28/2015 -- Last Friday  I spoke to Dr. Leotis Pain regarding her care and he said that her right leg did not need any surgery and on the left leg was doing pretty good. We did agree that if she undergoes any procedure in the future he would do a couple of punch biopsies of the wound. 08/18/2015 -- her right leg is very tender and there is significant amount of slough. The left leg is looking much better 09/02/2015 -- she is going to have a debridement and punch biopsies of her right lower extremity by Dr. Leotis Pain this coming Thursday. Also seen Dr. Ola Spurr who has continued her on ciprofloxacin. 09/09/2015 -- on 09/04/2015 Dr. Leotis Pain took her to surgery - Irrigation and excisional debridement of skin, soft tissue, and muscle to about 40 cm2 to the right posterior calf with biopsy. Pathology results are -- DIAGNOSIS: SKIN, RIGHT LOWER EXTREMITY; BIOPSY: SKIN AND SOFT TISSUE WITH ULCERATION, SEE NOTE. - NEGATIVE FOR DYSPLASIA AND MALIGNANCY. Note: There is acute inflammation and ulceration of the epidermis. The dermis shows nonspecific inflammation, neovascularization, and hemosiderin deposition. The differential diagnosis for these findings includes stasis dermatitis, nonspecific dermatitis, and infection. Correlation with clinical findings is required. A PAS fungal stain is obtained and results will be reported in an addendum. cultures were also sent and this grew Pseudomonas aeruginosa, Escherichia coli, Proteus mirabilis, Klebsiella pneumoniae and they were all sensitive to ciprofloxacin which she is on. 09/16/2015 -- he saw Dr. 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 He is going to call the patient in and discuss with her the management possibly with Imuran. I will continue treating her locally. 09/23/2015 -- she saw Dr. Ola Spurr today who was going to continue the antibiotics for now  and stop after this course. She has an appointment to see Dr. Jefm Bryant in about a week's time. Her wound VAC has arrived but she did not bring it with her today. We will try and set her up for changes to the right leg 3 times a week. She is here for her first application of Apligraf to the left lower extremity. LUKISHA, STRATMAN ([id number]) 09/30/2015 -- she has seen Dr. 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 because she is ambulatory at home health will not apply a wound VAC and she will have to come here 3 times a week on Monday Wednesday and Friday. 10/10/2015 -- she is here for a second application of Apligraf to  her left lower extremity. 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 Jefm Bryant. Other than that she is doing well 10/31/2015 -- she is here for a third application of Apligraf to her left lower extremity. after reviewing her wound on the right side I noted that it is granulating extremely well and we will use the remnants of the Apligraf on the right leg. 11/14/2015 -- she was recently admitted to the hospital between January 11 and 11/10/2015 for uncontrolled hypertension, diabetes mellitus, acute kidney injury. during her admission she was supported by wound care and also by antibiotics. Around this time her immunosuppression was stopped by Dr. Nunzio Cory. She is feeling much better now. 11/21/2015 -- she has had her fourth application of Apligraf today and it has been shared between the left and the right leg 11/24/2015 -- was here for a wound VAC change but it was noticed that she had profuse bleeding from the lobe medial part of her right lower extremity and I was called in to evaluate her. 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  She has been doing fine since then 12/19/2015 -- she has finished 5 applications of Apligraf to the left lower extremity and this is doing great. He began with pain and swelling of her right ankle a couple of days ago and it has caused her much distress. She does not have any fever or any change in her general health. 12/22/2015 -- the patient's pain and swelling of the right lower extremity has resolved over the weekend. The left lower extremity is completely healed. She has had the first application of Apligraf on the right leg today. 01/02/2016 -- She is here for the second application of Apligraf on the right leg today. 01/19/2016 -- she is here for the third application of Apligraf to the right posterior lateral leg. 02/02/2016 -- today she has had her fourth application of Apligraf and we have applied some of it to the right posterior lateral leg and the remaining waist we have used for the left anterior medial leg. 02/12/2016 -- today she is use the fifth application of Apligraf to the right posterior lateral leg. The remaining to be wasted has been applied to the left anterior medial leg. 03/01/2016 -- her copayment for the Apligraf is about $300 apiece and she's been to talk to her family to see if she can afford it. 03/15/2016 -- she was able to get some charity care to help with her copayment and we have applied the first Apligraf to her right anterior medial leg. Objective Constitutional Pulse regular. Respirations normal and unlabored. Afebrile. DIANIA, REASON. ([id number]) Vitals Time Taken: 1:32 PM, Height: 65 in, Weight: 248 lbs, BMI: 41.3, Pulse: 77 bpm, Respiratory Rate: 20 breaths/min, Blood Pressure: 136/61 mmHg. Eyes Nonicteric. Reactive to light. Ears, Nose, Mouth, and Throat Lips, teeth, and gums WNL.Marland Kitchen Moist mucosa without lesions. Neck supple and nontender. No palpable supraclavicular or cervical adenopathy. Normal sized without goiter. Respiratory WNL. No  retractions.. Breath sounds WNL, No rubs, rales, rhonchi, or wheeze.. Cardiovascular Heart rhythm and rate regular, no murmur or gallop.. Pedal Pulses WNL. No clubbing, cyanosis or edema. Chest Breasts symmetical and no nipple discharge.. Breast tissue WNL, no masses, lumps, or tenderness.. Lymphatic No adneopathy. No adenopathy. No adenopathy. Musculoskeletal Adexa without tenderness or enlargement.. Digits and nails w/o clubbing, cyanosis, infection, petechiae, ischemia, or inflammatory  conditions.Marland Kitchen Psychiatric Judgement and insight Intact.. No evidence of depression, anxiety, or agitation.. General Notes: the posterior wound has just a small area which is almost completely epithelialized and we were just dressed this with a bordered foam. There is no hyper granulation on this. The anterior medial wound is looking very clean and after the usual precautions we have applied Apligraf today. Integumentary (Hair, Skin) No suspicious lesions. No crepitus or fluctuance. No peri-wound warmth or erythema. No masses.. Wound #5 status is Open. Original cause of wound was Gradually Appeared. The wound is located on the Right,Posterior Lower Leg. The wound measures 0.3cm length x 0.5cm width x 0.1cm depth; 0.118cm^2 area and 0.012cm^3 volume. The wound is limited to skin breakdown. There is no tunneling or undermining noted. There is a large amount of serosanguineous drainage noted. The wound margin is indistinct and nonvisible. There is large (67-100%) pink granulation within the wound bed. There is a small (1-33%) amount of necrotic tissue within the wound bed including Adherent Slough. The periwound skin appearance exhibited: Localized Edema, Moist. The periwound skin appearance did not exhibit: Callus, Crepitus, Excoriation, Fluctuance, Friable, Induration, Rash, Scarring, Dry/Scaly, Maceration, Atrophie Blanche, Cyanosis, Ecchymosis, Hemosiderin Staining, Mottled, Pallor, Rubor, Erythema.  Periwound temperature was noted as No Abnormality. TIANA, BURNE ([id number]) Wound #6 status is Open. Original cause of wound was Gradually Appeared. The wound is located on the Right,Medial Lower Leg. The wound measures 5.4cm length x 3.3cm width x 0.1cm depth; 13.996cm^2 area and 1.4cm^3 volume. The wound is limited to skin breakdown. There is a medium amount of serous drainage noted. The wound margin is flat and intact. There is large (67-100%) red granulation within the wound bed. There is a small (1-33%) amount of necrotic tissue within the wound bed including Adherent Slough. The periwound skin appearance exhibited: Moist. The periwound skin appearance did not exhibit: Callus, Crepitus, Excoriation, Fluctuance, Friable, Induration, Localized Edema, Rash, Scarring, Dry/Scaly, Maceration, Atrophie Blanche, Cyanosis, Ecchymosis, Hemosiderin Staining, Mottled, Pallor, Rubor, Erythema. Periwound temperature was noted as No Abnormality. The periwound has tenderness on palpation. 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 A skin graft procedure using a bioengineered skin substitute/cellular or tissue based product was performed by [name], MD. Apligraf was applied and secured with Steri-Strips. 18 sq cm of product was utilized and 26 sq cm was wasted due to wound size. Post Application, MEPITEL was applied. A Time Out was conducted prior to the start of the procedure. The procedure was tolerated well with a pain level of 0 throughout and a pain level of 0  following the procedure. Post procedure Diagnosis Wound #6: Same as Pre-Procedure . Plan Wound Cleansing: Aschoff, Andreina D. ([id number]) Wound #5 Right,Posterior Lower Leg: Clean wound with Normal Saline. Wound #6 Right,Medial Lower Leg: Clean wound with Normal Saline. Anesthetic: Wound #5 Right,Posterior Lower Leg: Topical Lidocaine 4% cream applied to wound bed prior to debridement Wound #6 Right,Medial Lower Leg: Topical Lidocaine 4% cream applied to wound bed prior to debridement Skin Barriers/Peri-Wound Care: Wound #5 Right,Posterior Lower Leg: Skin Prep Wound #6 Right,Medial Lower Leg: Skin Prep Primary Wound Dressing: Wound #5 Right,Posterior  Lower Leg: Foam Wound #6 Right,Medial Lower Leg: Other: - Apligraf Secondary Dressing: Wound #5 Right,Posterior Lower Leg: Dry Gauze Conform/Kerlix Wound #6 Right,Medial Lower Leg: Dry Gauze Conform/Kerlix - mepitel, steri-strips Dressing Change Frequency: Wound #5 Right,Posterior Lower Leg: Change dressing every week Wound #6 Right,Medial Lower Leg: Change dressing every week Follow-up Appointments: Wound #5 Right,Posterior Lower Leg: Return Appointment in 1 week. Wound #6 Right,Medial Lower Leg: Return Appointment in 1 week. Edema Control: Wound #5 Right,Posterior Lower Leg: Elevate legs to the level of the heart and pump ankles as often as possible Other: - compression hose Wound #6 Right,Medial Lower Leg: Elevate legs to the level of the heart and pump ankles as often as possible Other: - compression hose Advanced Therapies: Wound #5 Right,Posterior Lower Leg: Apligraf application in clinic; including contact layer, fixation with steri strips, dry gauze and cover dressing. - Patient instructed to leave dressing in place till next visit. Not to get it wet. Wound #6 Right,Medial Lower Leg: Apligraf application in clinic; including contact layer, fixation with steri strips, dry gauze and cover dressing. - Patient  instructed to leave dressing in place till next visit. Not to get it wet. JAHNAVI, CISCO ([id number]) The posterior wound has just a small area which is almost completely epithelialized and we will just dressed this with a bordered foam. There is no hyper granulation on this. The anterior medial wound is looking very clean and after the usual precautions we have applied Apligraf today. this has been bolstered in place with Steri-Strips and we will apply a appropriate Kerlix and Coban dressing and then she will wear her compression stockings. Electronic Signature(s) Signed: 03/18/2016 11:25:23 AM By: [name], [name] Previous Signature: 03/15/2016 2:49:46 PM Version By: [name], [name] Entered By: [name] on 03/18/2016 11:25:23 Kathol, Cherril D. ([id number]) -------------------------------------------------------------------------------- SuperBill Details Patient Name: GRACLYN, JARRIEL D. Date of Service: 03/15/2016 Medical Record Number: [id number] Patient Account Number: [id number] Date of Birth/Sex: Nov 22, 1945 (70 y.o. Female) Treating RN: [name] Primary Care Physician: Glendon Axe Other Clinician: Referring Physician: Glendon Axe Treating Physician/Extender: Frann Rider in Treatment: 58 Diagnosis Coding ICD-10 Codes Code Description E11.622 Type 2 diabetes mellitus with other skin ulcer E66.01 Morbid (severe) obesity due to excess calories I89.0 Lymphedema, not elsewhere classified L97.212 Non-pressure chronic ulcer of right calf with fat layer exposed I87.311 Chronic venous hypertension (idiopathic) with ulcer of right lower extremity Facility Procedures CPT4 Code: [id number] Description: (Facility Use Only) Apligraf 1 SQ CM Modifier: Quantity: 7 CPT4 Code: [id number] Description: [id number] - SKIN SUB GRAFT TRNK/ARM/LEG ICD-10 Description Diagnosis E11.622 Type 2 diabetes mellitus with other skin ulcer E66.01 Morbid (severe) obesity due to excess calories  I89.0 Lymphedema, not elsewhere classified L97.212 Non-pressure  chronic ulcer of right calf with fat l Modifier: ayer exposed Quantity: 1 Physician Procedures CPT4 Code: [id number] Description: [id number] - WC PHYS SKIN SUB GRAFT TRNK/ARM/LEG ICD-10 Description Diagnosis E11.622 Type 2 diabetes mellitus with other skin ulcer E66.01 Morbid (severe) obesity due to excess calories I89.0 Lymphedema, not elsewhere classified L97.212  Non-pressure chronic ulcer of right calf with fat la Modifier: yer exposed Quantity: 1 Electronic Signature(s) Signed: 03/15/2016 2:49:59 PM By: [name], [name] Entered By: [name] on 03/15/2016 14:49:59 [place], Liller D. ([id number])

## 2016-03-17 NOTE — Progress Notes (Signed)
RAKELLE, NONG ([id number]) Visit Report for 03/15/2016 Arrival Information Details Patient Name: [name], [name] Date of Service: 03/15/2016 1:30 PM Medical Record Number: [id number] Patient Account Number: [id number] Date of Birth/Sex: 1945/11/25 (70 y.o. Female) Treating RN: Carolyne Fiscal, Debi Primary Care Physician: Glendon Axe Other Clinician: Referring Physician: Glendon Axe Treating Physician/Extender: Frann Rider in Treatment: 66 Visit Information History Since Last Visit All ordered tests and consults were completed: No Patient Arrived: Cane Added or deleted any medications: No Arrival Time: 13:31 Any new allergies or adverse reactions: No Accompanied By: self Had a fall or experienced change in No Transfer Assistance: None activities of daily living that may affect Patient Identification Verified: Yes risk of falls: Secondary Verification Process Yes Signs or symptoms of abuse/neglect since last No Completed: visito Patient Requires Transmission- No Hospitalized since last visit: No Based Precautions: Pain Present Now: No Patient Has Alerts: Yes Patient Alerts: Patient on Blood Thinner Electronic Signature(s) Signed: 03/16/2016 4:50:53 PM By: Alric Quan Entered By: Alric Quan on 03/15/2016 13:32:10 [name], [name] ([id number]) -------------------------------------------------------------------------------- Encounter Discharge Information Details Patient Name: Kristin, [name] D. Date of Service: 03/15/2016 1:30 PM Medical Record Number: [id number] Patient Account Number: [id number] Date of Birth/Sex: [birth year] (70 y.o. Female) Treating RN: Carolyne Fiscal, Debi Primary Care Physician: Glendon Axe Other Clinician: Referring Physician: Glendon Axe Treating Physician/Extender: Frann Rider in Treatment: 63 Encounter Discharge Information Items Discharge Pain Level: 0 Discharge Condition: Stable Ambulatory Status: Cane Discharge  Destination: Home Transportation: Private Auto Accompanied By: self Schedule Follow-up Appointment: Yes Medication Reconciliation completed Yes and provided to Patient/Care Schyler Counsell: Provided on Clinical Summary of Care: 03/15/2016 Form Type Recipient Paper Patient LM Electronic Signature(s) Signed: 03/16/2016 4:50:53 PM By: Alric Quan Previous Signature: 03/15/2016 2:35:17 PM Version By: Ruthine Dose Entered By: Alric Quan on 03/15/2016 14:36:02 [name], [name] ([id number]) -------------------------------------------------------------------------------- Lower Extremity Assessment Details Patient Name: [name] D. Date of Service: 03/15/2016 1:30 PM Medical Record Number: [id number] Patient Account Number: [id number] Date of Birth/Sex: [birth year] (70 y.o. Female) Treating RN: Carolyne Fiscal, Debi Primary Care Physician: Glendon Axe Other Clinician: Referring Physician: Glendon Axe Treating Physician/Extender: Frann Rider in Treatment: 58 Vascular Assessment Pulses: Posterior Tibial Dorsalis Pedis Palpable: [Right:Yes] Extremity colors, hair growth, and conditions: Extremity Color: [Right:Hyperpigmented] Temperature of Extremity: [Right:Warm] Capillary Refill: [Right:< 3 seconds] Electronic Signature(s) Signed: 03/16/2016 4:50:53 PM By: Alric Quan Entered By: Alric Quan on 03/15/2016 13:37:38 [name], [name] ([id number]) -------------------------------------------------------------------------------- Multi Wound Chart Details Patient Name: [name] D. Date of Service: 03/15/2016 1:30 PM Medical Record Number: [id number] Patient Account Number: [id number] Date of Birth/Sex: May 24, 1946 (70 y.o. Female) Treating RN: Carolyne Fiscal, Debi Primary Care Physician: Glendon Axe Other Clinician: Referring Physician: Glendon Axe Treating Physician/Extender: Frann Rider in Treatment: 58 Vital Signs Height(in): 65 Pulse(bpm):  77 Weight(lbs): 248 Blood Pressure 136/61 (mmHg): Body Mass Index(BMI): 41 Temperature(F): Respiratory Rate 20 (breaths/min): Photos: [5:No Photos] [6:No Photos] [N/A:N/A] Wound Location: [5:Right Lower Leg - Posterior] [6:Right Lower Leg - Medial N/A] Wounding Event: [5:Gradually Appeared] [6:Gradually Appeared] [N/A:N/A] Primary Etiology: [5:Venous Leg Ulcer] [6:Diabetic Wound/Ulcer of the Lower Extremity] [N/A:N/A] Comorbid History: [5:Cataracts, Asthma, Coronary Artery Disease, Coronary Artery Disease, Hypertension, Type II Diabetes, Osteoarthritis, Diabetes, Osteoarthritis, Neuropathy] [6:Cataracts, Asthma, Hypertension, Type II Neuropathy] [N/A:N/A] Date Acquired: [5:04/29/2015] [6:10/10/2015] [N/A:N/A] Weeks of Treatment: [5:42] [6:22] [N/A:N/A] Wound Status: [5:Open] [6:Open] [N/A:N/A] Measurements L x W x D 0.3x0.5x0.1 [6:5.4x3.3x0.1] [N/A:N/A] (cm) Area (cm) : [5:0.118] [6:13.996] [N/A:N/A] Volume (cm) : [5:0.012] [6:1.4] [N/A:N/A] % Reduction in Area: [5:37.20%] [6:-305.00%] [N/A:N/A] % Reduction  in Volume: 36.80% [6:-304.60%] [N/A:N/A] Classification: [5:Full Thickness Without Exposed Support Structures] [6:Grade 1] [N/A:N/A] HBO Classification: [5:Grade 2] [6:N/A] [N/A:N/A] Exudate Amount: [5:Large] [6:Medium] [N/A:N/A] Exudate Type: [5:Serosanguineous] [6:Serous] [N/A:N/A] Exudate Color: [5:red, brown] [6:amber] [N/A:N/A] Wound Margin: [5:Indistinct, nonvisible] [6:Flat and Intact] [N/A:N/A] Granulation Amount: [5:Large (67-100%)] [6:Large (67-100%)] [N/A:N/A] Granulation Quality: [5:Pink, Hyper-granulation Red] [N/A:N/A] Necrotic Amount: Small (1-33%) Small (1-33%) N/A Exposed Structures: Fascia: No Fascia: No N/A Fat: No Fat: No Tendon: No Tendon: No Muscle: No Muscle: No Joint: No Joint: No Bone: No Bone: No Limited to Skin Limited to Skin Breakdown Breakdown Epithelialization: None None N/A Periwound Skin Texture: Edema: Yes Edema: No  N/A Excoriation: No Excoriation: No Induration: No Induration: No Callus: No Callus: No Crepitus: No Crepitus: No Fluctuance: No Fluctuance: No Friable: No Friable: No Rash: No Rash: No Scarring: No Scarring: No Periwound Skin Moist: Yes Moist: Yes N/A Moisture: Maceration: No Maceration: No Dry/Scaly: No Dry/Scaly: No Periwound Skin Color: Atrophie Blanche: No Atrophie Blanche: No N/A Cyanosis: No Cyanosis: No Ecchymosis: No Ecchymosis: No Erythema: No Erythema: No Hemosiderin Staining: No Hemosiderin Staining: No Mottled: No Mottled: No Pallor: No Pallor: No Rubor: No Rubor: No Temperature: No Abnormality No Abnormality N/A Tenderness on No Yes N/A Palpation: Wound Preparation: Ulcer Cleansing: Ulcer Cleansing: N/A Rinsed/Irrigated with Rinsed/Irrigated with Saline Saline Topical Anesthetic Topical Anesthetic Applied: Other: lidocaine Applied: Other: lidocaine 4% 4% Treatment Notes Electronic Signature(s) Signed: 03/16/2016 4:50:53 PM By: Alric Quan Entered By: Alric Quan on 03/15/2016 13:47:30 [name], [name] ([id number]) -------------------------------------------------------------------------------- [place] Details Patient Name: RAMATOULAYE, PUGLIA D. Date of Service: 03/15/2016 1:30 PM Medical Record Number: [id number] Patient Account Number: [id number] Date of Birth/Sex: 1946-10-12 (70 y.o. Female) Treating RN: Carolyne Fiscal, Debi Primary Care Physician: Glendon Axe Other Clinician: Referring Physician: Glendon Axe Treating Physician/Extender: Frann Rider in Treatment: 56 Active Inactive Orientation to the Wound Care Program Nursing Diagnoses: Knowledge deficit related to the wound healing center program Goals: Patient/caregiver will verbalize understanding of the [place] Program Date Initiated: 01/31/2015 Goal Status: Active Interventions: Provide education on orientation to the wound  center Notes: Venous Leg Ulcer Nursing Diagnoses: Potential for venous Insuffiency (use before diagnosis confirmed) Goals: Non-invasive venous studies are completed as ordered Date Initiated: 01/31/2015 Goal Status: Active Patient/caregiver will verbalize understanding of disease process and disease management Date Initiated: 01/31/2015 Goal Status: Active Interventions: Assess peripheral edema status every visit. Notes: Wound/Skin Impairment Nursing Diagnoses: Impaired tissue integrity Knowledge deficit related to smoking impact on wound healing [name], [name] ([id number]) Goals: Patient/caregiver will verbalize understanding of skin care regimen Date Initiated: 01/31/2015 Goal Status: Active Ulcer/skin breakdown will heal within 14 weeks Date Initiated: 01/31/2015 Goal Status: Active Interventions: Assess ulceration(s) every visit Notes: Electronic Signature(s) Signed: 03/16/2016 4:50:53 PM By: Alric Quan Entered By: Alric Quan on 03/15/2016 13:47:18 [name], [name] ([id number]) -------------------------------------------------------------------------------- Pain Assessment Details Patient Name: [name] D. Date of Service: 03/15/2016 1:30 PM Medical Record Number: [id number] Patient Account Number: [id number] Date of Birth/Sex: 1946-02-04 (70 y.o. Female) Treating RN: Carolyne Fiscal, Debi Primary Care Physician: Glendon Axe Other Clinician: Referring Physician: Glendon Axe Treating Physician/Extender: Frann Rider in Treatment: 74 Active Problems Location of Pain Severity and Description of Pain Patient Has Paino No Site Locations Pain Management and Medication Current Pain Management: Electronic Signature(s) Signed: 03/16/2016 4:50:53 PM By: Alric Quan Entered By: Alric Quan on 03/15/2016 13:32:16 Renshaw, Malyssa D. ([id number]) -------------------------------------------------------------------------------- Patient/Caregiver Education  Details Patient Name: [name] D. Date of Service: 03/15/2016 1:30 PM Medical Record  Number: [id number] Patient Account Number: [id number] Date of Birth/Gender: 1946/06/29 (70 y.o. Female) Treating RN: Carolyne Fiscal, Debi Primary Care Physician: Glendon Axe Other Clinician: Referring Physician: Glendon Axe Treating Physician/Extender: Frann Rider in Treatment: 71 Education Assessment Education Provided To: Patient Education Topics Provided Wound/Skin Impairment: Handouts: Other: change dressing as ordered Methods: Demonstration, Explain/Verbal Responses: State content correctly Electronic Signature(s) Signed: 03/16/2016 4:50:53 PM By: Alric Quan Entered By: Alric Quan on 03/15/2016 13:57:44 [name], [name] ([id number]) -------------------------------------------------------------------------------- Wound Assessment Details Patient Name: [name], [name] Date of Service: 03/15/2016 1:30 PM Medical Record Number: [id number] Patient Account Number: [id number] Date of Birth/Sex: 11/17/1945 (70 y.o. Female) Treating RN: Carolyne Fiscal, Debi Primary Care Physician: Glendon Axe Other Clinician: Referring Physician: Glendon Axe Treating Physician/Extender: Frann Rider in Treatment: 55 Wound Status Wound Number: 5 Primary Venous Leg Ulcer Etiology: Wound Location: Right Lower Leg - Posterior Wound Open Wounding Event: Gradually Appeared Status: Date Acquired: 04/29/2015 Comorbid Cataracts, Asthma, Coronary Artery Weeks Of Treatment: 42 History: Disease, Hypertension, Type II Clustered Wound: No Diabetes, Osteoarthritis, Neuropathy Photos Photo Uploaded By: Alric Quan on 03/15/2016 16:08:23 Wound Measurements Length: (cm) 0.3 Width: (cm) 0.5 Depth: (cm) 0.1 Area: (cm) 0.118 Volume: (cm) 0.012 % Reduction in Area: 37.2% % Reduction in Volume: 36.8% Epithelialization: None Tunneling: No Undermining: No Wound Description Full  Thickness Without Exposed Foul Odor A Classification: Support Structures Diabetic Severity Grade 2 (Wagner): Wound Margin: Indistinct, nonvisible Exudate Amount: Large Exudate Type: Serosanguineous Exudate Color: red, brown fter Cleansing: No Wound Bed Granulation Amount: Large (67-100%) Exposed Structure [name], [name] ([id number]) Granulation Quality: Pink, Hyper-granulation Fascia Exposed: No Necrotic Amount: Small (1-33%) Fat Layer Exposed: No Necrotic Quality: Adherent Slough Tendon Exposed: No Muscle Exposed: No Joint Exposed: No Bone Exposed: No Limited to Skin Breakdown Periwound Skin Texture Texture Color No Abnormalities Noted: No No Abnormalities Noted: No Callus: No Atrophie Blanche: No Crepitus: No Cyanosis: No Excoriation: No Ecchymosis: No Fluctuance: No Erythema: No Friable: No Hemosiderin Staining: No Induration: No Mottled: No Localized Edema: Yes Pallor: No Rash: No Rubor: No Scarring: No Temperature / Pain Moisture Temperature: No Abnormality No Abnormalities Noted: No Dry / Scaly: No Maceration: No Moist: Yes Wound Preparation Ulcer Cleansing: Rinsed/Irrigated with Saline Topical Anesthetic Applied: Other: lidocaine 4%, Treatment Notes Wound #5 (Right, Posterior Lower Leg) 1. Cleansed with: Clean wound with Normal Saline 2. Anesthetic Topical Lidocaine 4% cream to wound bed prior to debridement 3. Peri-wound Care: Skin Prep 4. Dressing Applied: Foam 5. Secondary Dressing Applied Dry Gauze Kerlix/Conform 7. Secured with Tape Notes [name], [name] ([id number]) netting Electronic Signature(s) Signed: 03/16/2016 4:50:53 PM By: Alric Quan Entered By: Alric Quan on 03/15/2016 13:41:24 [name], [name] ([id number]) -------------------------------------------------------------------------------- Wound Assessment Details Patient Name: [name], [name] Date of Service: 03/15/2016 1:30 PM Medical Record Number:  [id number] Patient Account Number: [id number] Date of Birth/Sex: 1945/11/30 (70 y.o. Female) Treating RN: Carolyne Fiscal, Debi Primary Care Physician: Glendon Axe Other Clinician: Referring Physician: Glendon Axe Treating Physician/Extender: Frann Rider in Treatment: 55 Wound Status Wound Number: 6 Primary Diabetic Wound/Ulcer of the Lower Etiology: Extremity Wound Location: Right Lower Leg - Medial Wound Open Wounding Event: Gradually Appeared Status: Date Acquired: 10/10/2015 Comorbid Cataracts, Asthma, Coronary Artery Weeks Of Treatment: 22 History: Disease, Hypertension, Type II Clustered Wound: No Diabetes, Osteoarthritis, Neuropathy Photos Photo Uploaded By: Alric Quan on 03/15/2016 16:08:33 Wound Measurements Length: (cm) 5.4 % Reduction in Width: (cm) 3.3 % Reduction in Depth: (cm) 0.1 Epithelializati Area: (cm) 13.996 Volume: (cm) 1.4 Area: -305% Volume: -  304.6% on: None Wound Description Classification: Grade 1 Wound Margin: Flat and Intact Exudate Amount: Medium Exudate Type: Serous Exudate Color: amber Foul Odor After Cleansing: No Wound Bed Granulation Amount: Large (67-100%) Exposed Structure Granulation Quality: Red Fascia Exposed: No Necrotic Amount: Small (1-33%) Fat Layer Exposed: No Necrotic Quality: Adherent Slough Tendon Exposed: No [name], [name] ([id number]) Muscle Exposed: No Joint Exposed: No Bone Exposed: No Limited to Skin Breakdown Periwound Skin Texture Texture Color No Abnormalities Noted: No No Abnormalities Noted: No Callus: No Atrophie Blanche: No Crepitus: No Cyanosis: No Excoriation: No Ecchymosis: No Fluctuance: No Erythema: No Friable: No Hemosiderin Staining: No Induration: No Mottled: No Localized Edema: No Pallor: No Rash: No Rubor: No Scarring: No Temperature / Pain Moisture Temperature: No Abnormality No Abnormalities Noted: No Tenderness on Palpation: Yes Dry / Scaly:  No Maceration: No Moist: Yes Wound Preparation Ulcer Cleansing: Rinsed/Irrigated with Saline Topical Anesthetic Applied: Other: lidocaine 4%, Treatment Notes Wound #6 (Right, Medial Lower Leg) 1. Cleansed with: Clean wound with Normal Saline 2. Anesthetic Topical Lidocaine 4% cream to wound bed prior to debridement 3. Peri-wound Care: Skin Prep 4. Dressing Applied: Other dressing (specify in notes) 5. Secondary Dressing Applied Dry Gauze Kerlix/Conform 7. Secured with Tape Notes Apligraf, mepitel, steri-strips, netting Electronic Signature(s) MILY, PEEPLES ([id number]) Signed: 03/16/2016 4:50:53 PM By: Alric Quan Entered By: Alric Quan on 03/15/2016 13:46:59 Roets, Aireanna D. ([id number]) -------------------------------------------------------------------------------- Vitals Details Patient Name: [name] D. Date of Service: 03/15/2016 1:30 PM Medical Record Number: [id number] Patient Account Number: [id number] Date of Birth/Sex: [birth year] (70 y.o. Female) Treating RN: Carolyne Fiscal, Debi Primary Care Physician: Glendon Axe Other Clinician: Referring Physician: Glendon Axe Treating Physician/Extender: Frann Rider in Treatment: 45 Vital Signs Time Taken: 13:32 Pulse (bpm): 77 Height (in): 65 Respiratory Rate (breaths/min): 20 Weight (lbs): 248 Blood Pressure (mmHg): 136/61 Body Mass Index (BMI): 41.3 Reference Range: 80 - 120 mg / dl Electronic Signature(s) Signed: 03/16/2016 4:50:53 PM By: Alric Quan Entered By: Alric Quan on 03/15/2016 13:37:12

## 2016-03-25 ENCOUNTER — Encounter: Payer: Commercial Managed Care - HMO | Attending: Surgery | Admitting: Surgery

## 2016-03-25 DIAGNOSIS — J45909 Unspecified asthma, uncomplicated: Secondary | ICD-10-CM | POA: Insufficient documentation

## 2016-03-25 DIAGNOSIS — I890 Lymphedema, not elsewhere classified: Secondary | ICD-10-CM | POA: Insufficient documentation

## 2016-03-25 DIAGNOSIS — N189 Chronic kidney disease, unspecified: Secondary | ICD-10-CM | POA: Diagnosis not present

## 2016-03-25 DIAGNOSIS — E11622 Type 2 diabetes mellitus with other skin ulcer: Secondary | ICD-10-CM | POA: Insufficient documentation

## 2016-03-25 DIAGNOSIS — I2510 Atherosclerotic heart disease of native coronary artery without angina pectoris: Secondary | ICD-10-CM | POA: Diagnosis not present

## 2016-03-25 DIAGNOSIS — I129 Hypertensive chronic kidney disease with stage 1 through stage 4 chronic kidney disease, or unspecified chronic kidney disease: Secondary | ICD-10-CM | POA: Diagnosis not present

## 2016-03-25 DIAGNOSIS — Z6841 Body Mass Index (BMI) 40.0 and over, adult: Secondary | ICD-10-CM | POA: Insufficient documentation

## 2016-03-25 DIAGNOSIS — Z90710 Acquired absence of both cervix and uterus: Secondary | ICD-10-CM | POA: Insufficient documentation

## 2016-03-25 DIAGNOSIS — M316 Other giant cell arteritis: Secondary | ICD-10-CM | POA: Insufficient documentation

## 2016-03-25 DIAGNOSIS — E1140 Type 2 diabetes mellitus with diabetic neuropathy, unspecified: Secondary | ICD-10-CM | POA: Diagnosis not present

## 2016-03-25 DIAGNOSIS — L97212 Non-pressure chronic ulcer of right calf with fat layer exposed: Secondary | ICD-10-CM | POA: Insufficient documentation

## 2016-03-25 DIAGNOSIS — M1990 Unspecified osteoarthritis, unspecified site: Secondary | ICD-10-CM | POA: Diagnosis not present

## 2016-03-25 DIAGNOSIS — I87311 Chronic venous hypertension (idiopathic) with ulcer of right lower extremity: Secondary | ICD-10-CM | POA: Diagnosis not present

## 2016-03-25 DIAGNOSIS — E7800 Pure hypercholesterolemia, unspecified: Secondary | ICD-10-CM | POA: Diagnosis not present

## 2016-03-26 NOTE — Progress Notes (Addendum)
Kristin, [name] ([id number]) Visit Report for 03/25/2016 Chief Complaint Document Details Patient Name: [name], [name] Date of Service: 03/25/2016 1:30 PM Medical Record Number: [id number] Patient Account Number: [id number] Date of Birth/Sex: December 22, 1945 (70 y.o. Female) Treating RN: Carolyne Fiscal, Debi Primary Care Physician: Glendon Axe Other Clinician: Referring Physician: Glendon Axe Treating Physician/Extender: Frann Rider in Treatment: 26 Information Obtained from: Patient Chief Complaint Patient presents to the wound care center for a consult due non healing wound 70 year old patient comes with a history of having a ulcer on the left lower extremity for the past 4 weeks. she says she's had swelling of both lower extremities for about a year after she started having prednisone. 02/07/2015 -- her vascular appointments obtained were in the first and third week of June. she is able to go to [place] and we will try and get her some earlier appointments. Other than that nothing else has changed in her management. Electronic Signature(s) Signed: 03/25/2016 2:17:08 PM By: [name], [name] Entered By: [name] on 03/25/2016 14:17:07 [name], [name] ([id number]) -------------------------------------------------------------------------------- HPI Details Patient Name: [name], [name] Date of Service: 03/25/2016 1:30 PM Medical Record Number: [id number] Patient Account Number: [id number] Date of Birth/Sex: 12-Feb-1946 (70 y.o. Female) Treating RN: Carolyne Fiscal, Debi Primary Care Physician: Glendon Axe Other Clinician: Referring Physician: Glendon Axe Treating Physician/Extender: Frann Rider in Treatment: 63 History of Present Illness HPI Description: 70 year old patient who is known to have diabetes mellitus type 2, chronic renal insufficiency, coronary artery disease, hypertension, hypercholesterolemia, temporal arteritis and inflammatory arthritiss also has  a history of having a hysterectomy and some orthopedic related surgeries. The ulcer on the left lower extremity started off as a blister and then. Got progressively worse. She does not have any fever or chills and has not had any recent surgical intervention for this. Her last hemoglobin A1c was 10.1 in September 2015. She has been recently put on doxycycline by her PCP. She is now also allergic to doxycycline and was this was changed over to Keflex. 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 She does see a cardiologist and also takes a diuretic. 02/07/2015 her arterial and venous duplex studies to be done have dates been given as the first and third week of June. This is at [facility]. We are going to try and get early appointments at [facility] other than that nothing has changed in her management. 02/14/2015 -- we have been able to get her an appointment in [facility] on May 20 which is much earlier than her previous ones at [place]. She continues with her prednisone and her sugars are in the range of 150-200. 02/21/2015 We were able to get a vascular lab workup for her today and she is going to be there at 2:00 this afternoon. the swelling of her leg has gone down significantly but she still has some tenderness over the wounds. 02/28/2015 - She has had one of two vascular workups done, and this coming Tuesday has another, at [place] [facility]. She continues to be on steroid medications. She has significant sensitivity in her left lower extremity and has pain suggestive of neuropathic pain and I have asked her to address this with her primary care physician. 03/07/2015 -- The patient saw Dr. Lucky Cowboy for a consultation and he has had her arteries are okay but she has 2 incompetent veins on the  left lower extremity and he is going to set her up for surgery. Official  report is awaited. Addendum: Official reports are now available and on 03/04/2015. She was seen and lower extremity venous duplex exam was done. There was reflux present within the left greater saphenous vein below the knee and also the left small saphenous vein. Arterial duplex showed normal triphasic waveforms throughout the left lower extremity without any significant stenosis. Her ABIs were noncompressible bilaterally but a waveforms were normal and a digital pressures were normal bilaterally consistent with no significant arterial insufficiency. He has recommended endovenous ablation of both the left small saphenous and the left great saphenous vein. This would still be scheduled later. 03/14/2015 -- she has heard back from the vascular office and has surgery scheduled for sometime in July. Her rheumatologist has decreased her prednisone dosage but she still on it. She has also had cataract surgery in her right eye recently this week. [name], [name] ([id number]) 03/20/2015 - No new complaints today. Pain improved. No fever or chills. Tolerating 2 layer compression. 04/14/2015 -- she was doing very well today she went off on vacation and now her edema has increased markedly the ulceration is bigger and her diabetes is not under control. 04/21/2015 -- I spoke to her PCP Dr. Candiss Norse and discussed the management which would include being seen by a general surgeon for debridement and taking multiple punch biopsies which would help in establishing the diagnosis of this is a vasculitis. She is agreeable about this and will set her up for the procedure with Dr. Tamala Julian at [facility]. She was seen by the surgeon Dr. Jamal Collin. 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 She is scheduled to have laser ablation done next week.Pt was sent here for possible biopsy to look for vasculitis.  Feel it would be better to wait after laser ablation is completed- the ulcer may heal fully and biopsy may not be necessary 04/29/2015 -- she had the venous ablation done by Dr. Lucky Cowboy last Friday and we do not have any notes yet. She is doing fine otherwise. 05/06/2015 --Review of her recent vascular intervention shows that she was seen by Dr. Lucky Cowboy on 04/29/2015. The follow-up duplex which was done showed that both the great saphenous vein and the small saphenous vein remained patent with reflux consistent with an unsuccessful ablation. He has rescheduled her for another the endovenous ablation to be done in about 4 weekso time. 05/13/2015 -- he was seen by her surgeon Dr. Jamal Collin who asked her to continue with conservative therapy and he would speak to Dr. Lucky Cowboy about her management. Dr. Lucky Cowboy is going to schedule her surgery in the middle of August for a repeat endovenous ablation. Her pus culture from last week has grown : [place] her noted her sensitivity report but due to her multiple allergies I had tried clindamycin and she developed a rash with this too. She has been prescribed and anti-buttocks in the ER and is has it at home and she will let is know what she is going to be taking. 05/20/2015 -- she has developed a small spot on her right lower extremity but besides that it is not a full fledged ulceration. She did not get to see Dr. Lucky Cowboy last week and hopefully she will see him in the near future. 05/27/2015 -she is still awaiting her  appointment with Dr.Dew and her vascular procedure is not scheduled until August 19. She will be seeing her PCP tomorrow and I have asked her to convey our discussion so that she is aware that debridement has not been done yet. 06/03/2015 -- was seen by her rheumatologist Dr. Dorthula Matas, who has been treating her for temporal arteritis and in his note  has mentioned the possibility of vasculitis or pyoderma gangrenosum. He is lowering her prednisone to 12-1/2 mg for 1 month and then 10 mg per the next month. I will again make an attempt to speak to her PCP Dr. Candiss Norse and her surgeon Dr. 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 06/17/2015 -- Dr.Dew did her vascular procedure last week and a follow-up venous ultrasound shows good resolution of the veins as per the patient's history. He is to see her back in 2 weeks. 07/07/2015. -- the patient has had a heavy growth of Proteus mirabilis and Enterococcus faecalis. These Brideau, Dwayna D. ([id number]) are sensitive to several drugs but the problem is she has allergies to all of these and hence I would like her to see Dr. Ola Spurr for this. She is also due to see Dr. Lucky Cowboy tomorrow and I will discussed the management with him including debridement under anesthesia and possible biopsies. 07/14/2015 -- she has an appointment to see Dr. Ola Spurr tomorrow and did see Dr. Bunnie Domino PA who will discuss my request with him. 07/21/2015 -- saw Dr. Ola Spurr was able to do a test on her and has put her on amoxicillin. She has been tolerating that and has had no problems with allergies to this. 07/28/2015 -- Last Friday I spoke to Dr. Leotis Pain regarding her care and he said that her right leg did not need any surgery and on the left leg was doing pretty good. We did agree that if she undergoes any procedure in the future he would do a couple of punch biopsies of the wound. 08/18/2015 -- her right leg is very tender and there is significant amount of slough. The left leg is looking much better 09/02/2015 -- she is going to have a debridement and punch biopsies of her right lower extremity by Dr. Leotis Pain this coming Thursday. Also seen Dr. Ola Spurr who has continued her on ciprofloxacin. 09/09/2015 -- on  09/04/2015 Dr. Leotis Pain took her to surgery - Irrigation and excisional debridement of skin, soft tissue, and muscle to about 40 cm2 to the right posterior calf with biopsy. Pathology results are -- DIAGNOSIS: SKIN, RIGHT LOWER EXTREMITY; BIOPSY: SKIN AND SOFT TISSUE WITH ULCERATION, SEE NOTE. - NEGATIVE FOR DYSPLASIA AND MALIGNANCY. Note: There is acute inflammation and ulceration of the epidermis. The dermis shows nonspecific inflammation, neovascularization, and hemosiderin deposition. The differential diagnosis for these findings includes stasis dermatitis, nonspecific dermatitis, and infection. Correlation with clinical findings is required. A PAS fungal stain is obtained and results will be reported in an addendum. cultures were also sent and this grew Pseudomonas aeruginosa, Escherichia coli, Proteus mirabilis, Klebsiella pneumoniae and they were all sensitive to ciprofloxacin which she is on. 09/16/2015 -- he saw Dr. 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 He is going to call the patient in and discuss with her the management possibly with Imuran. I will continue treating  her locally. 09/23/2015 -- she saw Dr. Ola Spurr today who was going to continue the antibiotics for now and stop after this course. She has an appointment to see Dr. Jefm Bryant in about a week's time. Her wound VAC has arrived but she did not bring it with her today. We will try and set her up for changes to the right leg 3 times a week. She is here for her first application of Apligraf to the left lower extremity. 09/30/2015 -- she has seen Dr. 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 because she is ambulatory at home health will not apply a wound VAC and she will have to come here 3 times a week on  Monday Wednesday and Friday. 10/10/2015 -- she is here for a second application of Apligraf to her left lower extremity. 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 Jefm Bryant. Other than that she is doing well 10/31/2015 -- she is here for a third application of Apligraf to her left lower extremity. after reviewing her wound on the right side I noted that it is granulating extremely well and we will use the remnants of the Apligraf on the right leg. 11/14/2015 -- she was recently admitted to the hospital between January 11 and 11/10/2015 for Nygard, Danett D. ([id number]) uncontrolled hypertension, diabetes mellitus, acute kidney injury. during her admission she was supported by wound care and also by antibiotics. Around this time her immunosuppression was stopped by Dr. Nunzio Cory. She is feeling much better now. 11/21/2015 -- she has had her fourth application of Apligraf today and it has been shared between the left and the right leg 11/24/2015 -- was here for a wound VAC change but it was noticed that she had profuse bleeding from the lobe medial part of her right lower extremity and I was called in to evaluate her. 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 She has been doing fine since then 12/19/2015 -- she has finished 5 applications of Apligraf to the left lower extremity and this is doing great. He began with pain and swelling of her right ankle a couple of days ago and it has caused her much distress. She does not have any fever or any change in her general health. 12/22/2015 -- the patient's pain and swelling of the right lower extremity has resolved over the weekend. The left lower extremity is completely healed. She has had the first application of Apligraf on the right leg today. 01/02/2016 -- She is here for the second application  of Apligraf on the right leg today. 01/19/2016 -- she is here for the third application of Apligraf to the right posterior lateral leg. 02/02/2016 -- today she has had her fourth application of Apligraf and we have applied some of it to the right posterior lateral leg and the remaining waist we have used for the left anterior medial leg. 02/12/2016 -- today she is use the fifth application of Apligraf to the right posterior lateral leg. The remaining to be wasted has been applied to the left anterior medial leg. 03/01/2016 -- her copayment for the Apligraf is about $300 apiece and she's been to talk to her family to see if she can afford it. 03/15/2016 -- she was able to get some charity care to help with her  copayment and we have applied the first Apligraf to her right anterior medial leg. Electronic Signature(s) Signed: 03/25/2016 2:17:15 PM By: [name], [name] Entered By: [name] on 03/25/2016 14:17:14 Delfino, Tanise DMarland Kitchen ([id number]) -------------------------------------------------------------------------------- Physical Exam Details Patient Name: [name], [name] Date of Service: 03/25/2016 1:30 PM Medical Record Number: [id number] Patient Account Number: [id number] Date of Birth/Sex: 1946-09-18 (70 y.o. Female) Treating RN: Carolyne Fiscal, Debi Primary Care Physician: Glendon Axe Other Clinician: Referring Physician: Glendon Axe Treating Physician/Extender: Frann Rider in Treatment: 59 Constitutional . Pulse regular. Respirations normal and unlabored. Afebrile. . Eyes Nonicteric. Reactive to light. Ears, Nose, Mouth, and Throat Lips, teeth, and gums WNL.Marland Kitchen Moist mucosa without lesions. Neck supple and nontender. No palpable supraclavicular or cervical adenopathy. Normal sized without goiter. Respiratory WNL. No retractions.. Breath sounds WNL, No rubs, rales, rhonchi, or wheeze.. Cardiovascular Heart rhythm and rate regular, no murmur or gallop.. Pedal Pulses  WNL. No clubbing, cyanosis or edema. Chest Breasts symmetical and no nipple discharge.. Breast tissue WNL, no masses, lumps, or tenderness.. Lymphatic No adneopathy. No adenopathy. No adenopathy. Musculoskeletal Adexa without tenderness or enlargement.. Digits and nails w/o clubbing, cyanosis, infection, petechiae, ischemia, or inflammatory conditions.. Integumentary (Hair, Skin) No suspicious lesions. No crepitus or fluctuance. No peri-wound warmth or erythema. No masses.Marland Kitchen Psychiatric Judgement and insight Intact.. No evidence of depression, anxiety, or agitation.. Notes the posterior wound is completely healed and the anterior medial wound looks well under the Mepitel with the Apligraf and we will continue to wrap this area well for another week before it is reviewed Electronic Signature(s) Signed: 03/25/2016 2:17:49 PM By: [name], [name] Entered By: [name] on 03/25/2016 14:17:49 [name], [name] ([id number]) -------------------------------------------------------------------------------- Physician Orders Details Patient Name: Kristin Stare D. Date of Service: 03/25/2016 1:30 PM Medical Record Number: [id number] Patient Account Number: [id number] Date of Birth/Sex: 07-25-1946 (70 y.o. Female) Treating RN: Carolyne Fiscal, Debi Primary Care Physician: Glendon Axe Other Clinician: Referring Physician: Glendon Axe Treating Physician/Extender: Frann Rider in Treatment: 42 Verbal / Phone Orders: Yes Clinician: Carolyne Fiscal, Debi Read Back and Verified: Yes Diagnosis Coding Secondary Dressing Wound #6 Right,Medial Lower Leg o Dry Gauze o Conform/Kerlix o XtraSorb - charcoal Dressing Change Frequency Wound #6 Right,Medial Lower Leg o Change dressing every week Follow-up Appointments Wound #6 Right,Medial Lower Leg o Return Appointment in 1 week. Edema Control Wound #6 Right,Medial Lower Leg o Elevate legs to the level of the heart and pump ankles as  often as possible o Other: - compression hose Advanced Therapies Wound #6 Right,Medial Lower Leg o Apligraf application in clinic; including contact layer, fixation with steri strips, dry gauze and cover dressing. - Patient instructed to leave dressing in place till next visit. Not to get it wet. Electronic Signature(s) Signed: 03/25/2016 4:11:18 PM By: [name], [name] Signed: 03/25/2016 5:18:04 PM By: Alric Quan Entered By: Alric Quan on 03/25/2016 13:53:57 [name], [name] ([id number]) -------------------------------------------------------------------------------- Problem List Details Patient Name: KEYANNA, CANNELLA D. Date of Service: 03/25/2016 1:30 PM Medical Record Number: [id number] Patient Account Number: [id number] Date of Birth/Sex: 1946-05-21 (70 y.o. Female) Treating RN: Carolyne Fiscal, Debi Primary Care Physician: Glendon Axe Other Clinician: Referring Physician: Glendon Axe Treating Physician/Extender: Frann Rider in Treatment: 83 Active Problems ICD-10 Encounter Code Description Active Date Diagnosis E11.622 Type 2 diabetes mellitus with other skin ulcer 01/31/2015 Yes E66.01 Morbid (severe) obesity due to excess calories 01/31/2015 Yes I89.0 Lymphedema, not elsewhere classified 01/31/2015 Yes L97.212 Non-pressure chronic ulcer of right calf with fat layer 11/24/2015  Yes exposed I87.311 Chronic venous hypertension (idiopathic) with ulcer of 11/24/2015 Yes right lower extremity Inactive Problems Resolved Problems ICD-10 Code Description Active Date Resolved Date L97.322 Non-pressure chronic ulcer of left ankle with fat layer 01/31/2015 01/31/2015 exposed [id number] Varicose veins of left lower extremity with both ulcer of 03/07/2015 03/07/2015 calf and inflammation I83.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 with both ulcer of 03/07/2015 03/07/2015 ankle and inflammation [name], [name] ([id number]) L03.116 Cellulitis of left lower limb 07/07/2015  07/07/2015 Electronic Signature(s) Signed: 03/25/2016 2:16:54 PM By: [name], [name] Entered By: [name] on 03/25/2016 14:16:54 [name], [name] ([id number]) -------------------------------------------------------------------------------- Progress Note Details Patient Name: Kristin Stare D. Date of Service: 03/25/2016 1:30 PM Medical Record Number: [id number] Patient Account Number: [id number] Date of Birth/Sex: [birth year] (70 y.o. Female) Treating RN: Carolyne Fiscal, Debi Primary Care Physician: Glendon Axe Other Clinician: Referring Physician: Glendon Axe Treating Physician/Extender: Frann Rider in Treatment: 75 Subjective Chief Complaint Information obtained from Patient Patient presents to the wound care center for a consult due non healing wound 70 year old patient comes with a history of having a ulcer on the left lower extremity for the past 4 weeks. she says she's had swelling of both lower extremities for about a year after she started having prednisone. 02/07/2015 -- her vascular appointments obtained were in the first and third week of June. she is able to go to [place] and we will try and get her some earlier appointments. Other than that nothing else has changed in her management. History of Present Illness (HPI) 70 year old patient who is known to have diabetes mellitus type 2, chronic renal insufficiency, coronary artery disease, hypertension, hypercholesterolemia, temporal arteritis and inflammatory arthritiss also has a history of having a hysterectomy and some orthopedic related surgeries. The ulcer on the left lower extremity started off as a blister and then. Got progressively worse. She does not have any fever or chills and has not had any recent surgical intervention for this. Her last hemoglobin A1c was 10.1 in September 2015. She has been recently put on doxycycline by her PCP. She is now also allergic to doxycycline and was this was changed  over to Keflex. 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 She does see a cardiologist and also takes a diuretic. 02/07/2015 her arterial and venous duplex studies to be done have dates been given as the first and third week of June. This is at [facility]. We are going to try and get early appointments at [facility]. other than that nothing has changed in her management. 02/14/2015 -- we have been able to get her an appointment in [facility] on May 20 which is much earlier than her previous ones at [place]. She continues with her prednisone and her sugars are in the range of 150-200. 02/21/2015 We were able to get a vascular lab workup for her today and she is going to be there at 2:00 this afternoon. the swelling of her leg has gone down significantly but she still has some tenderness over the wounds. 02/28/2015 - She has had one of two vascular workups done, and this coming Tuesday has another, at [place] [facility]. She continues to be on steroid medications. She has significant sensitivity in her left lower extremity and has pain suggestive of neuropathic pain and I have asked her to address this with her primary care physician. 03/07/2015 --  The patient saw Dr. Lucky Cowboy for a consultation and he has had her arteries are okay but she has 2 incompetent veins on the left lower extremity and he is going to set her up for surgery. Official report is awaited. Addendum: Official reports are now available and on 03/04/2015. She was seen and lower extremity [name], [name] ([id number]) venous duplex exam was done. There was reflux present within the left greater saphenous vein below the knee and also the left small saphenous vein. Arterial duplex showed normal triphasic waveforms throughout the left lower extremity without any significant stenosis. Her ABIs were  noncompressible bilaterally but a waveforms were normal and a digital pressures were normal bilaterally consistent with no significant arterial insufficiency. He has recommended endovenous ablation of both the left small saphenous and the left great saphenous vein. This would still be scheduled later. 03/14/2015 -- she has heard back from the vascular office and has surgery scheduled for sometime in July. Her rheumatologist has decreased her prednisone dosage but she still on it. She has also had cataract surgery in her right eye recently this week. 03/20/2015 - No new complaints today. Pain improved. No fever or chills. Tolerating 2 layer compression. 04/14/2015 -- she was doing very well today she went off on vacation and now her edema has increased markedly the ulceration is bigger and her diabetes is not under control. 04/21/2015 -- I spoke to her PCP Dr. Candiss Norse and discussed the management which would include being seen by a general surgeon for debridement and taking multiple punch biopsies which would help in establishing the diagnosis of this is a vasculitis. She is agreeable about this and will set her up for the procedure with Dr. Tamala Julian at [facility]. She was seen by the surgeon Dr. Jamal Collin. 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 She is scheduled to have laser ablation done next week.Pt was sent here for possible biopsy to look for vasculitis. Feel it would be better to wait after laser ablation is completed- the ulcer may heal fully and biopsy may not be necessary 04/29/2015 -- she had the venous ablation done by Dr. Lucky Cowboy last Friday and we do not have any notes yet. She is doing fine otherwise. 05/06/2015 --Review of her recent vascular intervention shows that she was seen by Dr. Lucky Cowboy on 04/29/2015. The follow-up duplex which was done showed that both the great  saphenous vein and the small saphenous vein remained patent with reflux consistent with an unsuccessful ablation. He has rescheduled her for another the endovenous ablation to be done in about 4 weeks time. 05/13/2015 -- he was seen by her surgeon Dr. Jamal Collin who asked her to continue with conservative therapy and he would speak to Dr. Lucky Cowboy about her management. Dr. Lucky Cowboy is going to schedule her surgery in the middle of August for a repeat endovenous ablation. Her pus culture from last week has grown : [place] her noted her sensitivity report but due to her multiple allergies I had tried clindamycin and she developed a rash with this too. She has been prescribed and anti-buttocks in the ER and is has it at home and she will let is know what she is going to be taking. 05/20/2015 -- she has developed a small spot on her right lower extremity but besides that it is not a full fledged ulceration. She did  not get to see Dr. Lucky Cowboy last week and hopefully she will see him in the near future. 05/27/2015 -she is still awaiting her appointment with Dr.Dew and her vascular procedure is not scheduled until August 19. She will be seeing her PCP tomorrow and I have asked her to convey our discussion so that she is aware that debridement has not been done yet. 06/03/2015 -- was seen by her rheumatologist Dr. 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 He is lowering her prednisone to 12-1/2 mg for 1 month and then 10 mg per the next month. I will again make an attempt to speak to her PCP Dr. Candiss Norse and her surgeon Dr. 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 06/17/2015 --  Dr.Dew did her vascular procedure last week and a follow-up venous ultrasound shows good resolution of the veins as per the patient's history. He is to see her back in 2 weeks. 07/07/2015. -- the patient has had a heavy growth of Proteus mirabilis and Enterococcus faecalis. These are sensitive to several drugs but the problem is she has allergies to all of these and hence I would like her to see Dr. Ola Spurr for this. She is also due to see Dr. Lucky Cowboy tomorrow and I will discussed the management with him including debridement under anesthesia and possible biopsies. 07/14/2015 -- she has an appointment to see Dr. Ola Spurr tomorrow and did see Dr. Bunnie Domino PA who will discuss my request with him. 07/21/2015 -- saw Dr. Ola Spurr was able to do a test on her and has put her on amoxicillin. She has been tolerating that and has had no problems with allergies to this. 07/28/2015 -- Last Friday I spoke to Dr. Leotis Pain regarding her care and he said that her right leg did not need any surgery and on the left leg was doing pretty good. We did agree that if she undergoes any procedure in the future he would do a couple of punch biopsies of the wound. 08/18/2015 -- her right leg is very tender and there is significant amount of slough. The left leg is looking much better 09/02/2015 -- she is going to have a debridement and punch biopsies of her right lower extremity by Dr. Leotis Pain this coming Thursday. Also seen Dr. Ola Spurr who has continued her on ciprofloxacin. 09/09/2015 -- on 09/04/2015 Dr. Leotis Pain took her to surgery - Irrigation and excisional debridement of skin, soft tissue, and muscle to about 40 cm2 to the right posterior calf with biopsy. Pathology results are -- DIAGNOSIS: SKIN, RIGHT LOWER EXTREMITY; BIOPSY: SKIN AND SOFT TISSUE WITH ULCERATION, SEE NOTE. - NEGATIVE FOR DYSPLASIA AND MALIGNANCY. Note: There is acute inflammation and ulceration of the epidermis. The dermis shows  nonspecific inflammation, neovascularization, and hemosiderin deposition. The differential diagnosis for these findings includes stasis dermatitis, nonspecific dermatitis, and infection. Correlation with clinical findings is required. A PAS fungal stain is obtained and results will be reported in an addendum. cultures were also sent and this grew Pseudomonas aeruginosa, Escherichia coli, Proteus mirabilis, Klebsiella pneumoniae and they were all sensitive to ciprofloxacin which she is on. 09/16/2015 -- he saw Dr. 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 He is going to call the patient in and discuss with her the management possibly with Imuran. I will continue treating her locally. 09/23/2015 -- she saw Dr. Ola Spurr today who was going to continue the antibiotics for now and stop after this course. She has an appointment to see Dr. Jefm Bryant in about a week's time. Her wound VAC has arrived but she did not bring it with her today. We will try and set her up for changes to the right leg 3 times a week. She is here for her first application of Apligraf to the left lower extremity. 09/30/2015 -- she has seen Dr. 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 because she is ambulatory at home health [name], Kristin PETRAUSKAS. ([id number]) will not apply a wound VAC and she will have to come here 3 times a week on Monday Wednesday and Friday. 10/10/2015 -- she is here for a second application of Apligraf to her left lower extremity. 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 Jefm Bryant. Other than that she is doing well 10/31/2015 -- she is here for a third application of Apligraf to her left lower extremity. after reviewing her wound on the  right side I noted that it is granulating extremely well and we will use the remnants of the Apligraf on the right leg. 11/14/2015 -- she was recently admitted to the hospital between January 11 and 11/10/2015 for uncontrolled hypertension, diabetes mellitus, acute kidney injury. during her admission she was supported by wound care and also by antibiotics. Around this time her immunosuppression was stopped by Dr. Nunzio Cory. She is feeling much better now. 11/21/2015 -- she has had her fourth application of Apligraf today and it has been shared between the left and the right leg 11/24/2015 -- was here for a wound VAC change but it was noticed that she had profuse bleeding from the lobe medial part of her right lower extremity and I was called in to evaluate her. 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 She has been doing fine since then 12/19/2015 -- she has finished 5 applications of Apligraf to the left lower extremity and this is doing great. He began with pain and swelling of her right ankle a couple of days ago and it has caused her much distress. She does not have any fever or any change in her general health. 12/22/2015 -- the patient's pain and swelling of the right lower extremity has resolved over the weekend. The left lower extremity is completely healed. She has had the first application of Apligraf on the right leg today. 01/02/2016 -- She is here for the second application of Apligraf on the right leg today. 01/19/2016 -- she is here for the third application of Apligraf to the right posterior lateral leg. 02/02/2016 -- today she has had her fourth application of Apligraf and we have applied some of it to the right posterior lateral leg and the remaining waist we have used for the left anterior medial leg. 02/12/2016 -- today she is use the fifth application of Apligraf to the right  posterior lateral leg. The remaining to be wasted has been applied to the left anterior medial leg. 03/01/2016 -- her copayment for the Apligraf is about $300 apiece and she's been to talk  to her family to see if she can afford it. 03/15/2016 -- she was able to get some charity care to help with her copayment and we have applied the first Apligraf to her right anterior medial leg. Objective Constitutional Pulse regular. Respirations normal and unlabored. Afebrile. Vitals Time Taken: 1:39 PM, Height: 65 in, Weight: 248 lbs, BMI: 41.3, Pulse: 67 bpm, Respiratory Rate: 20 breaths/min, Blood Pressure: 149/54 mmHg. [name], [name] ([id number]) Eyes Nonicteric. Reactive to light. Ears, Nose, Mouth, and Throat Lips, teeth, and gums WNL.Marland Kitchen Moist mucosa without lesions. Neck supple and nontender. No palpable supraclavicular or cervical adenopathy. Normal sized without goiter. Respiratory WNL. No retractions.. Breath sounds WNL, No rubs, rales, rhonchi, or wheeze.. Cardiovascular Heart rhythm and rate regular, no murmur or gallop.. Pedal Pulses WNL. No clubbing, cyanosis or edema. Chest Breasts symmetical and no nipple discharge.. Breast tissue WNL, no masses, lumps, or tenderness.. Lymphatic No adneopathy. No adenopathy. No adenopathy. Musculoskeletal Adexa without tenderness or enlargement.. Digits and nails w/o clubbing, cyanosis, infection, petechiae, ischemia, or inflammatory conditions.Marland Kitchen Psychiatric Judgement and insight Intact.. No evidence of depression, anxiety, or agitation.. General Notes: the posterior wound is completely healed and the anterior medial wound looks well under the Mepitel with the Apligraf and we will continue to wrap this area well for another week before it is reviewed Integumentary (Hair, Skin) No suspicious lesions. No crepitus or fluctuance. No peri-wound warmth or erythema. No masses.. Wound #5 status is Healed - Epithelialized. Original cause of wound was  Gradually Appeared. The wound is located on the Right,Posterior Lower Leg. The wound measures 0cm length x 0cm width x 0cm depth; 0cm^2 area and 0cm^3 volume. Wound #6 status is Open. Original cause of wound was Gradually Appeared. The wound is located on the Right,Medial Lower Leg. The wound is limited to skin breakdown. There is a medium amount of serous drainage noted. The wound margin is flat and intact. There is large (67-100%) red granulation within the wound bed. There is a small (1-33%) amount of necrotic tissue within the wound bed including Adherent Slough. The periwound skin appearance exhibited: Moist. The periwound skin appearance did not exhibit: Callus, Crepitus, Excoriation, Fluctuance, Friable, Induration, Localized Edema, Rash, Scarring, Dry/Scaly, Maceration, Atrophie Blanche, Cyanosis, Ecchymosis, Hemosiderin Staining, Mottled, Pallor, Rubor, Erythema. Periwound temperature was noted as No Abnormality. The periwound has tenderness on palpation. [name], [name] ([id number]) General Notes: Unable to measure wound, pt still had skin sub and dressing on, she had came in for a dressing check. 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 Edema Control: Wound #6 Right,Medial Lower Leg: Elevate legs to the level of the heart and pump ankles as often as possible Other: - compression  hose Advanced Therapies: Wound #6 Right,Medial Lower Leg: Apligraf application in clinic; including contact layer, fixation with steri strips, dry gauze and cover dressing. - Patient instructed to leave dressing in place till next visit. Not to get it wet. BRYNNLEIGH, MALACH ([id number]) She will continue with a local dressing and her compression  wraps. I have also asked her to get in touch with her rheumatologist Dr. Jefm Bryant regarding her immunosuppression which she has let the medications run out last month. We will apply her second Apligraf next week. Electronic Signature(s) Signed: 03/26/2016 11:49:55 AM By: [name], [name] Previous Signature: 03/25/2016 2:19:10 PM Version By: [name], [name] Entered By: [name] on 03/26/2016 11:49:54 Nease, Wynnie D. ([id number]) -------------------------------------------------------------------------------- SuperBill Details Patient Name: MARSHAE, SHRAWDER D. Date of Service: 03/25/2016 Medical Record Number: [id number] Patient Account Number: [id number] Date of Birth/Sex: 07-01-1946 (70 y.o. Female) Treating RN: Carolyne Fiscal, Debi Primary Care Physician: Glendon Axe Other Clinician: Referring Physician: Glendon Axe Treating Physician/Extender: Frann Rider in Treatment: 59 Diagnosis Coding ICD-10 Codes Code Description E11.622 Type 2 diabetes mellitus with other skin ulcer E66.01 Morbid (severe) obesity due to excess calories I89.0 Lymphedema, not elsewhere classified L97.212 Non-pressure chronic ulcer of right calf with fat layer exposed I87.311 Chronic venous hypertension (idiopathic) with ulcer of right lower extremity Facility Procedures CPT4 Code: [id number] Description: 99213 - WOUND CARE VISIT-LEV 3 EST PT Modifier: Quantity: 1 Physician Procedures CPT4 Code Description: [id number] - WC PHYS LEVEL 3 - EST PT ICD-10 Description Diagnosis E11.622 Type 2 diabetes mellitus with other skin ulcer I89.0 Lymphedema, not  elsewhere classified I87.311 Chronic venous hypertension (idiopathic) with ulcer  L97.212 Non-pressure chronic ulcer of right calf with fat la Modifier: of right lower yer exposed Quantity: 1 extremity Electronic Signature(s) Signed: 03/26/2016 5:07:36 PM By: Alric Quan Previous Signature: 03/25/2016 2:19:34 PM Version By: [name], [name] Entered By: Alric Quan on 03/26/2016 16:44:25

## 2016-03-26 NOTE — Progress Notes (Signed)
[name], [name] ([id number]) Visit Report for 03/25/2016 Arrival Information Details Patient Name: [name], [name] Date of Service: 03/25/2016 1:30 PM Medical Record Number: [id number] Patient Account Number: [id number] Date of Birth/Sex: 24-Oct-1946 (70 y.o. Female) Treating RN: Carolyne Fiscal, [name] Primary Care Physician: [name] Other Clinician: Referring Physician: Glendon [name] Treating Physician/Extender: [name] in Treatment: 54 Visit Information History Since Last Visit All ordered tests and consults were completed: No Patient Arrived: Cane Added or deleted any medications: No Arrival Time: 13:39 Any new allergies or adverse reactions: No Accompanied By: self Had a fall or experienced change in No Transfer Assistance: None activities of daily living that may affect Patient Identification Verified: Yes risk of falls: Secondary Verification Process Yes Signs or symptoms of abuse/neglect since last No Completed: visito Patient Requires Transmission- No Hospitalized since last visit: No Based Precautions: Pain Present Now: No Patient Has Alerts: Yes Patient Alerts: Patient on Blood Thinner Electronic Signature(s) Signed: 03/25/2016 5:18:04 PM By: [name] Entered By: [name] on 03/25/2016 13:39:39 [name], [name] ([id number]) -------------------------------------------------------------------------------- Encounter Discharge Information Details Patient Name: [name], [name] Date of Service: 03/25/2016 1:30 PM Medical Record Number: [id number] Patient Account Number: [id number] Date of Birth/Sex: 13-Aug-1946 (70 y.o. Female) Treating RN: Carolyne Fiscal, [name] Primary Care Physician: [name] Other Clinician: Referring Physician: Glendon [name] Treating Physician/Extender: [name] in Treatment: 57 Encounter Discharge Information Items Discharge Pain Level: 0 Discharge Condition: Stable Ambulatory Status: Cane Discharge  Destination: Home Transportation: Private Auto Accompanied By: self Schedule Follow-up Appointment: Yes Medication Reconciliation completed Yes and provided to Patient/Care [name]: Provided on Clinical Summary of Care: 03/25/2016 Form Type Recipient Paper Patient LM Electronic Signature(s) Signed: 03/25/2016 2:02:48 PM By: [name] Entered By: [name] on 03/25/2016 14:02:48 [name], Alejandrina D. ([id number]) -------------------------------------------------------------------------------- Lower Extremity Assessment Details Patient Name: Kristin Stare D. Date of Service: 03/25/2016 1:30 PM Medical Record Number: [id number] Patient Account Number: [id number] Date of Birth/Sex: August 24, 1946 (70 y.o. Female) Treating RN: Carolyne Fiscal, [name] Primary Care Physician: [name] Other Clinician: Referring Physician: Glendon [name] Treating Physician/Extender: [name] in Treatment: 59 Vascular Assessment Pulses: Posterior Tibial Dorsalis Pedis Palpable: [Right:Yes] Extremity colors, hair growth, and conditions: Extremity Color: [Right:Hyperpigmented] Temperature of Extremity: [Right:Warm] Capillary Refill: [Right:< 3 seconds] Electronic Signature(s) Signed: 03/25/2016 5:18:04 PM By: [name] Entered By: [name] on 03/25/2016 13:47:15 [name], [name] ([id number]) -------------------------------------------------------------------------------- Multi Wound Chart Details Patient Name: Kristin Stare D. Date of Service: 03/25/2016 1:30 PM Medical Record Number: [id number] Patient Account Number: [id number] Date of Birth/Sex: 02/26/1946 (70 y.o. Female) Treating RN: Carolyne Fiscal, [name] Primary Care Physician: [name] Other Clinician: Referring Physician: Glendon [name] Treating Physician/Extender: [name] in Treatment: 63 Vital Signs Height(in): 65 Pulse(bpm): 67 Weight(lbs): 248 Blood Pressure 149/54 (mmHg): Body Mass Index(BMI):  41 Temperature(F): Respiratory Rate 20 (breaths/min): Photos: [5:No Photos] [N/A:N/A] Wound Location: [5:Right, Posterior Lower Leg] [N/A:N/A] Wounding Event: [5:Gradually Appeared] [N/A:N/A] Primary Etiology: [5:Venous Leg Ulcer] [N/A:N/A] Date Acquired: [5:04/29/2015] [N/A:N/A] Weeks of Treatment: [5:44] [N/A:N/A] Wound Status: [5:Healed - Epithelialized] [N/A:N/A] Measurements L x W x D 0x0x0 [N/A:N/A] (cm) Area (cm) : [5:0] [N/A:N/A] Volume (cm) : [5:0] [N/A:N/A] % Reduction in Area: [5:100.00%] [N/A:N/A] % Reduction in Volume: 100.00% [N/A:N/A] Classification: [5:Full Thickness Without Exposed Support Structures] [N/A:N/A] Periwound Skin Texture: No Abnormalities Noted [N/A:N/A] Periwound Skin [5:No Abnormalities Noted] [N/A:N/A] Moisture: Periwound Skin Color: No Abnormalities Noted [N/A:N/A] Tenderness on [5:No] [N/A:N/A] Treatment Notes Electronic Signature(s) Signed: 03/25/2016 5:18:04 PM By: Myrtie Soman, [name] ([id number]) Entered By: Carolyne Fiscal,  Debra on 03/25/2016 13:50:40 [name], [name] ([id number]) -------------------------------------------------------------------------------- Multi-Disciplinary Care Plan Details Patient Name: [name], [name] Date of Service: 03/25/2016 1:30 PM Medical Record Number: [id number] Patient Account Number: [id number] Date of Birth/Sex: 1946-03-24 (70 y.o. Female) Treating RN: Carolyne Fiscal, [name] Primary Care Physician: [name] Other Clinician: Referring Physician: Glendon [name] Treating Physician/Extender: [name] in Treatment: 24 Active Inactive Orientation to the Wound Care Program Nursing Diagnoses: Knowledge deficit related to the wound healing center program Goals: Patient/caregiver will verbalize understanding of the [facility] Program Date Initiated: 01/31/2015 Goal Status: Active Interventions: Provide education on orientation to the wound center Notes: Venous Leg Ulcer Nursing  Diagnoses: Potential for venous Insuffiency (use before diagnosis confirmed) Goals: Non-invasive venous studies are completed as ordered Date Initiated: 01/31/2015 Goal Status: Active Patient/caregiver will verbalize understanding of disease process and disease management Date Initiated: 01/31/2015 Goal Status: Active Interventions: Assess peripheral edema status every visit. Notes: Wound/Skin Impairment Nursing Diagnoses: Impaired tissue integrity Knowledge deficit related to smoking impact on wound healing [name], [name] ([id number]) Goals: Patient/caregiver will verbalize understanding of skin care regimen Date Initiated: 01/31/2015 Goal Status: Active Ulcer/skin breakdown will heal within 14 weeks Date Initiated: 01/31/2015 Goal Status: Active Interventions: Assess ulceration(s) every visit Notes: Electronic Signature(s) Signed: 03/25/2016 5:18:04 PM By: [name] Entered By: [name] on 03/25/2016 13:50:32 [name], [name] ([id number]) -------------------------------------------------------------------------------- Pain Assessment Details Patient Name: Kristin Stare D. Date of Service: 03/25/2016 1:30 PM Medical Record Number: [id number] Patient Account Number: [id number] Date of Birth/Sex: 18-Dec-1945 (70 y.o. Female) Treating RN: Carolyne Fiscal, [name] Primary Care Physician: [name] Other Clinician: Referring Physician: Glendon [name] Treating Physician/Extender: [name] in Treatment: 61 Active Problems Location of Pain Severity and Description of Pain Patient Has Paino No Site Locations Pain Management and Medication Current Pain Management: Electronic Signature(s) Signed: 03/25/2016 5:18:04 PM By: [name] Entered By: [name] on 03/25/2016 13:39:45 Millay, Lollie D. ([id number]) -------------------------------------------------------------------------------- Patient/Caregiver Education Details Patient Name: [name], [name] Date  of Service: 03/25/2016 1:30 PM Medical Record Number: [id number] Patient Account Number: [id number] Date of Birth/Gender: 1946/03/09 (70 y.o. Female) Treating RN: Carolyne Fiscal, [name] Primary Care Physician: [name] Other Clinician: Referring Physician: Glendon [name] Treating Physician/Extender: [name] in Treatment: 74 Education Assessment Education Provided To: Patient Education Topics Provided Wound/Skin Impairment: Handouts: Other: keep dressing dry Methods: Demonstration, Explain/Verbal Responses: State content correctly Electronic Signature(s) Signed: 03/25/2016 5:18:04 PM By: [name] Entered By: [name] on 03/25/2016 14:00:31 [name], [name] ([id number]) -------------------------------------------------------------------------------- Wound Assessment Details Patient Name: Hark, Marvalene D. Date of Service: 03/25/2016 1:30 PM Medical Record Number: [id number] Patient Account Number: [id number] Date of Birth/Sex: 08-05-1946 (70 y.o. Female) Treating RN: Carolyne Fiscal, [name] Primary Care Physician: [name] Other Clinician: Referring Physician: Glendon [name] Treating Physician/Extender: [name] in Treatment: 58 Wound Status Wound Number: 5 Primary Etiology: Venous Leg Ulcer Wound Location: Right, Posterior Lower Leg Wound Status: Healed - Epithelialized Wounding Event: Gradually Appeared Date Acquired: 04/29/2015 Weeks Of Treatment: 44 Clustered Wound: No Photos Photo Uploaded By: [name] on 03/25/2016 17:21:30 Wound Measurements Length: (cm) 0 % Reducti Width: (cm) 0 % Reducti Depth: (cm) 0 Area: (cm) 0 Volume: (cm) 0 on in Area: 100% on in Volume: 100% Wound Description Full Thickness Without Exposed Classification: Support Structures Periwound Skin Texture Texture Color No Abnormalities Noted: No No Abnormalities Noted: No Moisture No Abnormalities Noted: No Electronic Signature(s) Signed: 03/25/2016  5:18:04 PM By: Myrtie Soman, Kiyani D. ([id number]) Entered By: [name] on 03/25/2016  13:49:15 NEVA, PRUE ([id number]) -------------------------------------------------------------------------------- Wound Assessment Details Patient Name: CARRE, BEREZIN. Date of Service: 03/25/2016 1:30 PM Medical Record Number: [id number] Patient Account Number: [id number] Date of Birth/Sex: [birth year] (70 y.o. Female) Treating RN: Carolyne Fiscal, [name] Primary Care Physician: [name] Other Clinician: Referring Physician: Glendon [name] Treating Physician/Extender: [name] in Treatment: 59 Wound Status Wound Number: 6 Primary Diabetic Wound/Ulcer of the Lower Etiology: Extremity Wound Location: Right Lower Leg - Medial Wound Open Wounding Event: Gradually Appeared Status: Date Acquired: 10/10/2015 Comorbid Cataracts, Asthma, Coronary Artery Weeks Of Treatment: 23 History: Disease, Hypertension, Type II Clustered Wound: No Diabetes, Osteoarthritis, Neuropathy Photos Wound Measurements % Reduction in % Reduction in Epithelializati Area: Volume: on: None Wound Description Classification: Grade 1 Wound Margin: Flat and Intact Exudate Amount: Medium Exudate Type: Serous Exudate Color: amber Foul Odor After Cleansing: No Wound Bed Granulation Amount: Large (67-100%) Exposed Structure Granulation Quality: Red Fascia Exposed: No Necrotic Amount: Small (1-33%) Fat Layer Exposed: No Necrotic Quality: Adherent Slough Tendon Exposed: No Muscle Exposed: No Joint Exposed: No Bone Exposed: No Vereen, Flower D. ([id number]) Limited to Skin Breakdown Periwound Skin Texture Texture Color No Abnormalities Noted: No No Abnormalities Noted: No Callus: No Atrophie Blanche: No Crepitus: No Cyanosis: No Excoriation: No Ecchymosis: No Fluctuance: No Erythema: No Friable: No Hemosiderin Staining: No Induration: No Mottled: No Localized Edema: No Pallor:  No Rash: No Rubor: No Scarring: No Temperature / Pain Moisture Temperature: No Abnormality No Abnormalities Noted: No Tenderness on Palpation: Yes Dry / Scaly: No Maceration: No Moist: Yes Wound Preparation Ulcer Cleansing: Rinsed/Irrigated with Saline Topical Anesthetic Applied: Other: lidocaine 4%, Assessment Notes Unable to measure wound, pt still had skin sub and dressing on, she had came in for a dressing check. Treatment Notes Wound #6 (Right, Medial Lower Leg) 5. Secondary Dressing Applied Dry Gauze Kerlix/Conform 7. Secured with Tape Notes xtrasorb, charcoal, netting Electronic Signature(s) Unsigned Entered By: [name] on 03/25/2016 17:23:31 Signature(s): Date(s): ALEEMAH, GILCHRIST ([id number]) -------------------------------------------------------------------------------- Vitals Details Patient Name: BRITTANEY, NEESON D. Date of Service: 03/25/2016 1:30 PM Medical Record Number: [id number] Patient Account Number: [id number] Date of Birth/Sex: 09-24-1946 (70 y.o. Female) Treating RN: Carolyne Fiscal, [name] Primary Care Physician: [name] Other Clinician: Referring Physician: Glendon [name] Treating Physician/Extender: [name] in Treatment: 72 Vital Signs Time Taken: 13:39 Pulse (bpm): 67 Height (in): 65 Respiratory Rate (breaths/min): 20 Weight (lbs): 248 Blood Pressure (mmHg): 149/54 Body Mass Index (BMI): 41.3 Reference Range: 80 - 120 mg / dl Electronic Signature(s) Signed: 03/25/2016 5:18:04 PM By: [name] Entered By: [name] on 03/25/2016 13:41:40

## 2016-03-29 ENCOUNTER — Encounter: Payer: Commercial Managed Care - HMO | Admitting: Surgery

## 2016-03-29 DIAGNOSIS — E11622 Type 2 diabetes mellitus with other skin ulcer: Secondary | ICD-10-CM | POA: Diagnosis not present

## 2016-03-29 NOTE — Progress Notes (Signed)
[name], [name] ([id number]) Visit Report for 03/29/2016 Arrival Information Details Patient Name: [name], [name] Date of Service: 03/29/2016 10:45 AM Medical Record Number: [id number] Patient Account Number: [id number] Date of Birth/Sex: November 27, 1945 (70 y.o. Female) Treating RN: Carolyne Fiscal, Debi Primary Care Physician: Glendon Axe Other Clinician: Referring Physician: Glendon Axe Treating Physician/Extender: Frann Rider in Treatment: 76 Visit Information History Since Last Visit All ordered tests and consults were completed: No Patient Arrived: Ambulatory Added or deleted any medications: No Arrival Time: 11:04 Any new allergies or adverse reactions: No Accompanied By: self Signs or symptoms of abuse/neglect since last No Transfer Assistance: None visito Patient Identification Verified: Yes Hospitalized since last visit: No Secondary Verification Process Yes Pain Present Now: No Completed: Patient Requires Transmission- No Based Precautions: Patient Has Alerts: Yes Patient Alerts: Patient on Blood Thinner Electronic Signature(s) Signed: 03/29/2016 4:25:01 PM By: Alric Quan Entered By: Alric Quan on 03/29/2016 11:05:07 Perella, Anjolina D. ([id number]) -------------------------------------------------------------------------------- Encounter Discharge Information Details Patient Name: [name], [name] Date of Service: 03/29/2016 10:45 AM Medical Record Number: [id number] Patient Account Number: [id number] Date of Birth/Sex: [birth year] (70 y.o. Female) Treating RN: Carolyne Fiscal, Debi Primary Care Physician: Glendon Axe Other Clinician: Referring Physician: Glendon Axe Treating Physician/Extender: Frann Rider in Treatment: 66 Encounter Discharge Information Items Discharge Pain Level: 0 Discharge Condition: Stable Ambulatory Status: Cane Discharge Destination: Home Private Transportation: Auto Accompanied By: self Schedule Follow-up  Appointment: Yes Medication Reconciliation completed and Yes provided to Patient/Care Sherlyne Crownover: Clinical Summary of Care: Electronic Signature(s) Signed: 03/29/2016 4:25:01 PM By: Alric Quan Entered By: Alric Quan on 03/29/2016 11:46:36 [name], [name] ([id number]) -------------------------------------------------------------------------------- Lower Extremity Assessment Details Patient Name: Kristin Stare D. Date of Service: 03/29/2016 10:45 AM Medical Record Number: [id number] Patient Account Number: [id number] Date of Birth/Sex: [birth year] (70 y.o. Female) Treating RN: Carolyne Fiscal, Debi Primary Care Physician: Glendon Axe Other Clinician: Referring Physician: Glendon Axe Treating Physician/Extender: Frann Rider in Treatment: 60 Vascular Assessment Pulses: Posterior Tibial Dorsalis Pedis Palpable: [Right:Yes] Extremity colors, hair growth, and conditions: Extremity Color: [Right:Hyperpigmented] Temperature of Extremity: [Right:Warm] Capillary Refill: [Right:< 3 seconds] Toe Nail Assessment Left: Right: Thick: No Discolored: No Deformed: No Improper Length and Hygiene: No Electronic Signature(s) Signed: 03/29/2016 4:25:01 PM By: Alric Quan Entered By: Alric Quan on 03/29/2016 11:07:27 [name], [name] ([id number]) -------------------------------------------------------------------------------- Multi Wound Chart Details Patient Name: Kristin Stare D. Date of Service: 03/29/2016 10:45 AM Medical Record Number: [id number] Patient Account Number: [id number] Date of Birth/Sex: 02/25/1946 (70 y.o. 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] [N/A:N/A] Comorbid History: [6:Cataracts, Asthma, Coronary Artery Disease, Hypertension, Type II Diabetes, Osteoarthritis, Neuropathy] [N/A:N/A] Date Acquired: [6:10/10/2015] [N/A:N/A] Weeks of Treatment: [6:24] [N/A:N/A] Wound Status: [6:Open] [N/A:N/A] Measurements L x W x D 5.4x3x0.1 [N/A:N/A] (cm) Area (cm) : [6:12.723] [N/A:N/A] Volume (cm) : [6:1.272] [N/A:N/A] % Reduction in Area: [6:-268.10%] [N/A:N/A] % Reduction in Volume: -267.60% [N/A:N/A] Classification: [6:Grade 1] [N/A:N/A] Exudate Amount: [6:Medium] [N/A:N/A] Exudate Type: [6:Serous] [N/A:N/A] Exudate Color: [6:amber] [N/A:N/A] Wound Margin: [6:Flat and Intact] [N/A:N/A] Granulation Amount: [6:Large (67-100%)] [N/A:N/A] Granulation Quality: [6:Red] [N/A:N/A] Necrotic Amount: [6:Small (1-33%)] [N/A:N/A] Exposed Structures: [6:Fascia: No Fat: No Tendon: No] [N/A:N/A] Muscle:  No Joint: No Bone: No Limited to Skin Breakdown Epithelialization: None N/A N/A Periwound Skin Texture: Edema: No N/A N/A Excoriation: No Induration: No Callus: No Crepitus: No Fluctuance: No Friable: No Rash: No Scarring: No Periwound Skin Moist: Yes N/A N/A Moisture: Maceration: No Dry/Scaly: No Periwound Skin Color: Atrophie Blanche: No N/A N/A Cyanosis: No Ecchymosis: No Erythema: No Hemosiderin Staining: No Mottled: No Pallor: No Rubor: No Temperature: No Abnormality N/A N/A Tenderness on Yes N/A N/A Palpation: Wound Preparation: Ulcer Cleansing: N/A N/A Rinsed/Irrigated with Saline Topical Anesthetic Applied: Other: lidocaine 4% Treatment Notes Electronic Signature(s) Signed: 03/29/2016 4:25:01 PM By: Alric Quan Entered By: Alric Quan on 03/29/2016 11:15:13 [name], [name]  ([id number]) -------------------------------------------------------------------------------- [place] Details Patient Name: [name], [name] Date of Service: 03/29/2016 10:45 AM Medical Record Number: [id number] Patient Account Number: [id number] Date of Birth/Sex: 03-23-1946 (70 y.o. Female) Treating RN: Carolyne Fiscal, Debi Primary Care Physician: Glendon Axe Other Clinician: Referring Physician: Glendon Axe Treating Physician/Extender: Frann Rider in Treatment: 76 Active Inactive Orientation to the Wound Care Program Nursing Diagnoses: Knowledge deficit related to the wound healing center program Goals: Patient/caregiver will verbalize understanding of the [place] Program Date Initiated: 01/31/2015 Goal Status: Active Interventions: Provide education on orientation to the wound center Notes: Venous Leg Ulcer Nursing Diagnoses: Potential for venous Insuffiency (use before diagnosis confirmed) Goals: Non-invasive venous studies are completed as ordered Date Initiated: 01/31/2015 Goal Status: Active Patient/caregiver will verbalize understanding of disease process and disease management Date Initiated: 01/31/2015 Goal Status: Active Interventions: Assess peripheral edema status every visit. Notes: Wound/Skin Impairment Nursing Diagnoses: Impaired tissue integrity Knowledge deficit related to smoking impact on wound healing [name], [name] ([id number]) Goals: Patient/caregiver will verbalize understanding of skin care regimen Date Initiated: 01/31/2015 Goal Status: Active Ulcer/skin breakdown will heal within 14 weeks Date Initiated: 01/31/2015 Goal Status: Active Interventions: Assess ulceration(s) every visit Notes: Electronic Signature(s) Signed: 03/29/2016 4:25:01 PM By: Alric Quan Entered By: Alric Quan on 03/29/2016 11:15:04 [name], [name]  ([id number]) -------------------------------------------------------------------------------- Pain Assessment Details Patient Name: Kristin Stare D. Date of Service: 03/29/2016 10:45 AM Medical Record Number: [id number] Patient Account Number: [id number] Date of Birth/Sex: 1946/09/04 (70 y.o. Female) Treating RN: Carolyne Fiscal, Debi Primary Care Physician: Glendon Axe Other Clinician: Referring Physician: Glendon Axe Treating Physician/Extender: Frann Rider in Treatment: 60 Active Problems Location of Pain Severity and Description of Pain Patient Has Paino No Site Locations Pain Management and Medication Current Pain Management: Electronic Signature(s) Signed: 03/29/2016 4:25:01 PM By: Alric Quan Entered By: Alric Quan on 03/29/2016 11:05:13 [name], [name] ([id number]) -------------------------------------------------------------------------------- Patient/Caregiver Education Details Patient Name: KIMBLE, KOSTIUK D. Date of Service: 03/29/2016 10:45 AM Medical Record Number: [id number] Patient Account Number: [id number] Date of Birth/Gender: June 01, 1946 (70 y.o. Female) Treating RN: Carolyne Fiscal, Debi Primary Care Physician: Glendon Axe Other Clinician: Referring Physician: Glendon Axe Treating Physician/Extender: Frann Rider in Treatment: 8 Education Assessment Education Provided To: Patient Education Topics Provided Wound/Skin Impairment: Handouts: Other: do not get dressing wet Methods: Demonstration, Explain/Verbal Responses: State content correctly Electronic Signature(s) Signed: 03/29/2016 4:25:01 PM By: Alric Quan Entered By: Alric Quan on 03/29/2016 11:46:54 [name], [name] ([id number]) -------------------------------------------------------------------------------- Wound Assessment Details Patient Name: [name], [name] D. Date of Service: 03/29/2016 10:45 AM Medical Record Number: [id number] Patient Account Number: [id number] Date  of Birth/Sex: 05-08-1946 (70 y.o. Female) Treating RN: Ahmed Prima Primary Care Physician: Glendon Axe Other Clinician: Referring Physician: Glendon Axe Treating Physician/Extender: Frann Rider in Treatment: 60 Wound Status Wound Number: 6 Primary Diabetic  Wound/Ulcer of the Lower Etiology: Extremity Wound Location: Right Lower Leg - Medial Wound Open Wounding Event: Gradually Appeared Status: Date Acquired: 10/10/2015 Comorbid Cataracts, Asthma, Coronary Artery Weeks Of Treatment: 24 History: Disease, Hypertension, Type II Clustered Wound: No Diabetes, Osteoarthritis, Neuropathy Photos Photo Uploaded By: Alric Quan on 03/29/2016 15:49:43 Wound Measurements Length: (cm) 5.4 % Reduction in Width: (cm) 3 % Reduction in Depth: (cm) 0.1 Epithelializati Area: (cm) 12.723 Tunneling: Volume: (cm) 1.272 Undermining: Area: -268.1% Volume: -267.6% on: None No No Wound Description Classification: Grade 1 Wound Margin: Flat and Intact Exudate Amount: Medium Exudate Type: Serous Exudate Color: amber Foul Odor After Cleansing: No Wound Bed Granulation Amount: Large (67-100%) Exposed Structure Granulation Quality: Red Fascia Exposed: No Necrotic Amount: Small (1-33%) Fat Layer Exposed: No Necrotic Quality: Adherent Slough Tendon Exposed: No Fiorello, Phyllistine D. ([id number]) Muscle Exposed: No Joint Exposed: No Bone Exposed: No Limited to Skin Breakdown Periwound Skin Texture Texture Color No Abnormalities Noted: No No Abnormalities Noted: No Callus: No Atrophie Blanche: No Crepitus: No Cyanosis: No Excoriation: No Ecchymosis: No Fluctuance: No Erythema: No Friable: No Hemosiderin Staining: No Induration: No Mottled: No Localized Edema: No Pallor: No Rash: No Rubor: No Scarring: No Temperature / Pain Moisture Temperature: No Abnormality No Abnormalities Noted: No Tenderness on Palpation: Yes Dry / Scaly: No Maceration: No Moist:  Yes Wound Preparation Ulcer Cleansing: Rinsed/Irrigated with Saline Topical Anesthetic Applied: Other: lidocaine 4%, Treatment Notes Wound #6 (Right, Medial Lower Leg) 1. Cleansed with: Clean wound with Normal Saline 2. Anesthetic Topical Lidocaine 4% cream to wound bed prior to debridement 3. Peri-wound Care: Skin Prep 4. Dressing Applied: Other dressing (specify in notes) 5. Secondary Dressing Applied Dry Gauze Kerlix/Conform 7. Secured with Tape Notes xtrasorb, charcoal, APLIGRAPG, netting Electronic Signature(s) LANAYA, LAWES ([id number]) Signed: 03/29/2016 4:25:01 PM By: Alric Quan Entered By: Alric Quan on 03/29/2016 11:11:29 Pindell, Trenyce D. ([id number]) -------------------------------------------------------------------------------- Vitals Details Patient Name: Kristin Stare D. Date of Service: 03/29/2016 10:45 AM Medical Record Number: [id number] Patient Account Number: [id number] Date of Birth/Sex: 07-May-1946 (70 y.o. Female) Treating RN: Carolyne Fiscal, Debi Primary Care Physician: Glendon Axe Other Clinician: Referring Physician: Glendon Axe Treating Physician/Extender: Frann Rider in Treatment: 60 Vital Signs Time Taken: 11:05 Pulse (bpm): 87 Height (in): 65 Respiratory Rate (breaths/min): 20 Weight (lbs): 248 Blood Pressure (mmHg): 137/50 Body Mass Index (BMI): 41.3 Reference Range: 80 - 120 mg / dl Electronic Signature(s) Signed: 03/29/2016 4:25:01 PM By: Alric Quan Entered By: Alric Quan on 03/29/2016 11:07:04

## 2016-03-29 NOTE — Progress Notes (Signed)
[name], [name] ([id number]) Visit Report for 03/29/2016 Chief Complaint Document Details Patient Name: [name], [name] 03/29/2016 10:45 Date of Service: AM Medical Record [id number] Number: Patient Account Number: [id number] 06/17/1946 (70 y.o. Treating RN: Ahmed Prima Date of Birth/Sex: Female) Other Clinician: Primary Care Physician: [facility], Delana Meyer Treating [name] Referring Physician: Glendon Axe Physician/Extender: Weeks in Treatment: 24 Information Obtained from: Patient Chief Complaint Patient presents to the wound care center for a consult due non healing wound 70 year old patient comes with a history of having a ulcer on the left lower extremity for the past 4 weeks. she says she's had swelling of both lower extremities for about a year after she started having prednisone. 02/07/2015 -- her vascular appointments obtained were in the first and third week of June. she is able to go to [place] and we will try and get her some earlier appointments. Other than that nothing else has changed in her management. Electronic Signature(s) Signed: 03/29/2016 11:49:35 AM By: [name], [name] Entered By: [name] on 03/29/2016 11:49:35 [name], [name] ([id number]) -------------------------------------------------------------------------------- Cellular or Tissue Based Product Details Patient Name: [name], [name] 03/29/2016 10:45 Date of Service: AM Medical Record [id number] Number: Patient Account Number: [id number] 07/11/1946 (70 y.o. Treating RN: Ahmed Prima Date of Birth/Sex: Female) Other Clinician: Primary Care Physician: [facility], Delana Meyer Treating [name] Referring Physician: Glendon Axe Physician/Extender: Weeks in Treatment: 40 Cellular or Tissue Based Wound #6 Right,Medial Lower Leg Product Type Applied to: Performed By: Physician [name], MD Cellular or Tissue Based Apligraf Product Type: Time-Out Taken: Yes Location: trunk / arms  / legs Wound Size (sq cm): 16.2 Product Size (sq cm): 44 Waste Size (sq cm): 26 Amount of Product Applied (sq cm): 18 Lot #: [id number] Expiration Date: 04/07/2016 Fenestrated: Yes Instrument: Blade Reconstituted: Yes Solution Type: NORMAL SALINE Solution Amount: 2 ML Lot #: [id number] Solution Expiration 12/23/2017 Date: Secured: Yes Secured With: Steri-Strips Dressing Applied: Yes Primary Dressing: MEPITEL Procedural Pain: 0 Post Procedural Pain: 0 Response to Treatment: Procedure was tolerated well Post Procedure Diagnosis Same as Pre-procedure Electronic Signature(s) Signed: 03/29/2016 4:25:01 PM By: Alric Quan Previous Signature: 03/29/2016 11:49:23 AM Version By: [name], [name] [name], [name] ([id number]) Entered By: Alric Quan on 03/29/2016 11:58:16 [name], [name] ([id number]) -------------------------------------------------------------------------------- HPI Details Patient Name: Kristin, [name] [name] 03/29/2016 10:45 Date of Service: AM Medical Record [id number] Number: Patient Account Number: [id number] 13-May-1946 (70 y.o. Treating RN: Ahmed Prima Date of Birth/Sex: Female) Other Clinician: Primary Care Physician: [facility], Delana Meyer Treating Ajaya Crutchfield Referring Physician: Glendon Axe Physician/Extender: Weeks in Treatment: 58 History of Present Illness HPI Description: 70 year old patient who is known to have diabetes mellitus type 2, chronic renal insufficiency, coronary artery disease, hypertension, hypercholesterolemia, temporal arteritis and inflammatory arthritiss also has a history of having a hysterectomy and some orthopedic related surgeries. The ulcer on the left lower extremity started off as a blister and then. Got progressively worse. She does not have any fever or chills and has not had any recent surgical intervention for this. Her last hemoglobin A1c was 10.1 in September 2015. She has been recently put on doxycycline by her  PCP. She is now also allergic to doxycycline and was this was changed over to Keflex. 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 She does see a cardiologist and also takes a diuretic. 02/07/2015 her arterial and venous duplex studies  to be done have dates been given as the first and third week of June. This is at [facility]. We are going to try and get early appointments at [facility]. other than that nothing has changed in her management. 02/14/2015 -- we have been able to get her an appointment in [facility] on May 20 which is much earlier than her previous ones at [place]. She continues with her prednisone and her sugars are in the range of 150-200. 02/21/2015 We were able to get a vascular lab workup for her today and she is going to be there at 2:00 this afternoon. the swelling of her leg has gone down significantly but she still has some tenderness over the wounds. 02/28/2015 - She has had one of two vascular workups done, and this coming Tuesday has another, at [facility]. She continues to be on steroid medications. She has significant sensitivity in her left lower extremity and has pain suggestive of neuropathic pain and I have asked her to address this with her primary care physician. 03/07/2015 -- The patient saw Dr. Lucky Cowboy for a consultation and he has had her arteries are okay but she has 2 incompetent veins on the left lower extremity and he is going to set her up for surgery. Official report is awaited. Addendum: Official reports are now available and on 03/04/2015. She was seen and lower extremity venous duplex exam was done. There was reflux present within the left greater saphenous vein below the knee and also the left small saphenous vein. Arterial duplex showed normal triphasic waveforms throughout the left lower extremity without any  significant stenosis. Her ABIs were noncompressible bilaterally but a waveforms were normal and a digital pressures were normal bilaterally consistent with no significant arterial insufficiency. He has recommended endovenous ablation of both the left small saphenous and the left great saphenous vein. This would still be scheduled later. 03/14/2015 -- she has heard back from the vascular office and has surgery scheduled for sometime in July. [name], [name] ([id number]) Her rheumatologist has decreased her prednisone dosage but she still on it. She has also had cataract surgery in her right eye recently this week. 03/20/2015 - No new complaints today. Pain improved. No fever or chills. Tolerating 2 layer compression. 04/14/2015 -- she was doing very well today she went off on vacation and now her edema has increased markedly the ulceration is bigger and her diabetes is not under control. 04/21/2015 -- I spoke to her PCP Dr. Candiss Norse and discussed the management which would include being seen by a general surgeon for debridement and taking multiple punch biopsies which would help in establishing the diagnosis of this is a vasculitis. She is agreeable about this and will set her up for the procedure with Dr. Tamala Julian at [facility]. She was seen by the surgeon Dr. Jamal Collin. 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 She is scheduled to have laser ablation done next week.Pt was sent here for possible biopsy to look for vasculitis. Feel it would be better to wait after laser ablation is completed- the ulcer may heal fully and biopsy may not be necessary 04/29/2015 -- she had the venous ablation done by Dr. Lucky Cowboy last Friday and we do not have any notes yet. She is doing fine otherwise. 05/06/2015 --Review of her recent vascular intervention shows that she was seen by Dr. Lucky Cowboy on 04/29/2015.  The follow-up duplex  which was done showed that both the great saphenous vein and the small saphenous vein remained patent with reflux consistent with an unsuccessful ablation. He has rescheduled her for another the endovenous ablation to be done in about 4 weekso time. 05/13/2015 -- he was seen by her surgeon Dr. Jamal Collin who asked her to continue with conservative therapy and he would speak to Dr. Lucky Cowboy about her management. Dr. Lucky Cowboy is going to schedule her surgery in the middle of August for a repeat endovenous ablation. Her pus culture from last week has grown : [place] her noted her sensitivity report but due to her multiple allergies I had tried clindamycin and she developed a rash with this too. She has been prescribed and anti-buttocks in the ER and is has it at home and she will let is know what she is going to be taking. 05/20/2015 -- she has developed a small spot on her right lower extremity but besides that it is not a full fledged ulceration. She did not get to see Dr. Lucky Cowboy last week and hopefully she will see him in the near future. 05/27/2015 -she is still awaiting her appointment with Dr.Dew and her vascular procedure is not scheduled until August 19. She will be seeing her PCP tomorrow and I have asked her to convey our discussion so that she is aware that debridement has not been done yet. 06/03/2015 -- was seen by her rheumatologist Dr. 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 He is lowering her prednisone to 12-1/2 mg for 1 month and then 10 mg per the next month. I will again make an attempt to speak to her PCP Dr. Candiss Norse and her surgeon Dr. Lucky Cowboy to see if he can organize for a debridement in the OR with multiple biopsies to establish a diagnosis of vasculitis  or pyoderma gangrenosum. 06/17/2015 -- Dr.Dew did her vascular procedure last week and a follow-up venous ultrasound shows good resolution of the veins as per the patient's history. He is to see her back in 2 weeks. [name], [name] ([id number]) 07/07/2015. -- the patient has had a heavy growth of Proteus mirabilis and Enterococcus faecalis. These are sensitive to several drugs but the problem is she has allergies to all of these and hence I would like her to see Dr. Ola Spurr for this. She is also due to see Dr. Lucky Cowboy tomorrow and I will discussed the management with him including debridement under anesthesia and possible biopsies. 07/14/2015 -- she has an appointment to see Dr. Ola Spurr tomorrow and did see Dr. Bunnie Domino PA who will discuss [name] request with him. 07/21/2015 -- saw Dr. Ola Spurr was able to do a test on her and has put her on amoxicillin. She has been tolerating that and has had no problems with allergies to this. 07/28/2015 -- Last Friday I spoke to Dr. Leotis Pain regarding her care and he said that her right leg did not need any surgery and on the left leg was doing pretty good. We did agree that if she undergoes any procedure in the future he would do a couple of punch biopsies of the wound. 08/18/2015 -- her right leg is very tender and there is significant amount of slough. The left leg is looking much better 09/02/2015 -- she is going to have a debridement and punch biopsies of her right lower  extremity by Dr. Leotis Pain this coming Thursday. Also seen Dr. Ola Spurr who has continued her on ciprofloxacin. 09/09/2015 -- on 09/04/2015 Dr. Leotis Pain took her to surgery - Irrigation and excisional debridement of skin, soft tissue, and muscle to about 40 cm2 to the right posterior calf with biopsy. Pathology results are -- DIAGNOSIS: SKIN, RIGHT LOWER EXTREMITY; BIOPSY: SKIN AND SOFT TISSUE WITH ULCERATION, SEE NOTE. - NEGATIVE FOR DYSPLASIA AND MALIGNANCY. Note: There is acute  inflammation and ulceration of the epidermis. The dermis shows nonspecific inflammation, neovascularization, and hemosiderin deposition. The differential diagnosis for these findings includes stasis dermatitis, nonspecific dermatitis, and infection. Correlation with clinical findings is required. A PAS fungal stain is obtained and results will be reported in an addendum. cultures were also sent and this grew Pseudomonas aeruginosa, Escherichia coli, Proteus mirabilis, Klebsiella pneumoniae and they were all sensitive to ciprofloxacin which she is on. 09/16/2015 -- he saw Dr. 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 He is going to call the patient in and discuss with her the management possibly with Imuran. I will continue treating her locally. 09/23/2015 -- she saw Dr. Ola Spurr today who was going to continue the antibiotics for now and stop after this course. She has an appointment to see Dr. Jefm Bryant in about a week's time. Her wound VAC has arrived but she did not bring it with her today. We will try and set her up for changes to the right leg 3 times a week. She is here for her first application of Apligraf to the left lower extremity. 09/30/2015 -- she has seen Dr. 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 because she is ambulatory at home health will not apply a wound VAC and she will have to come here 3 times a week on Monday Wednesday and Friday. 10/10/2015 -- she is here for a second application of Apligraf to her left lower extremity. 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 Jefm Bryant. Other than that she is doing well 10/31/2015 -- she is here for a third application of Apligraf to her left lower extremity.  after reviewing her wound on the right side I noted that it is granulating extremely well and we will use the remnants of the Apligraf on the right leg. NIJA, WENDLAND ([id number]) 11/14/2015 -- she was recently admitted to the hospital between January 11 and 11/10/2015 for uncontrolled hypertension, diabetes mellitus, acute kidney injury. during her admission she was supported by wound care and also by antibiotics. Around this time her immunosuppression was stopped by Dr. Nunzio Cory. She is feeling much better now. 11/21/2015 -- she has had her fourth application of Apligraf today and it has been shared between the left and the right leg 11/24/2015 -- was here for a wound VAC change but it was noticed that she had profuse bleeding from the lobe medial part of her right lower extremity and I was called in to evaluate her. 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 She has been doing fine since then 12/19/2015 -- she has finished 5 applications of Apligraf to the left lower extremity and this is doing great. He began with pain and swelling of  her right ankle a couple of days ago and it has caused her much distress. She does not have any fever or any change in her general health. 12/22/2015 -- the patient's pain and swelling of the right lower extremity has resolved over the weekend. The left lower extremity is completely healed. She has had the first application of Apligraf on the right leg today. 01/02/2016 -- She is here for the second application of Apligraf on the right leg today. 01/19/2016 -- she is here for the third application of Apligraf to the right posterior lateral leg. 02/02/2016 -- today she has had her fourth application of Apligraf and we have applied some of it to the right posterior lateral leg and the remaining waist we have used for the left anterior medial leg. 02/12/2016 -- today she  is use the fifth application of Apligraf to the right posterior lateral leg. The remaining to be wasted has been applied to the left anterior medial leg. 03/01/2016 -- her copayment for the Apligraf is about $300 apiece and she's been to talk to her family to see if she can afford it. 03/15/2016 -- she was able to get some charity care to help with her copayment and we have applied the first Apligraf to her right anterior medial leg. 03/29/2016 -- she is here for a second application to the right anterior medial leg. Electronic Signature(s) Signed: 03/29/2016 11:50:27 AM By: [name], [name] Entered By: [name] on 03/29/2016 11:50:27 [name], [name] ([id number]) -------------------------------------------------------------------------------- Physical Exam Details Patient Name: [name], [name] 03/29/2016 10:45 Date of Service: AM Medical Record [id number] Number: Patient Account Number: [id number] 1945/11/22 (70 y.o. Treating RN: Ahmed Prima Date of Birth/Sex: Female) Other Clinician: Primary Care Physician: [facility], [name] Treating [name] Referring Physician: Glendon Axe Physician/Extender: Weeks in Treatment: 60 Constitutional . Pulse regular. Respirations normal and unlabored. Afebrile. . Eyes Nonicteric. Reactive to light. Ears, Nose, Mouth, and Throat Lips, teeth, and gums WNL.Marland Kitchen Moist mucosa without lesions. Neck supple and nontender. No palpable supraclavicular or cervical adenopathy. Normal sized without goiter. Respiratory WNL. No retractions.. Cardiovascular Pedal Pulses WNL. No clubbing, cyanosis or edema. Lymphatic No adneopathy. No adenopathy. No adenopathy. Musculoskeletal Adexa without tenderness or enlargement.. Digits and nails w/o clubbing, cyanosis, infection, petechiae, ischemia, or inflammatory conditions.. Integumentary (Hair, Skin) No suspicious lesions. No crepitus or fluctuance. No peri-wound warmth or erythema. No  masses.Marland Kitchen Psychiatric Judgement and insight Intact.. No evidence of depression, anxiety, or agitation.. Notes the anterior medial wound on the right leg looks healthy and has clean granulation tissue and with the usual precautions then next Apligraf was applied and bolstered in place with Steri-Strips Electronic Signature(s) Signed: 03/29/2016 11:51:16 AM By: [name], [name] Entered By: [name] on 03/29/2016 11:51:16 Ess, WINONA SERLIN ([id number]) -------------------------------------------------------------------------------- Physician Orders Details Patient Name: DASIYA, HELDER [name] 03/29/2016 10:45 Date of Service: AM Medical Record [id number] Number: Patient Account Number: [id number] July 10, 1946 (70 y.o. Treating RN: Ahmed Prima Date of Birth/Sex: Female) Other Clinician: Primary Care Physician: [facility], [name] Treating [name] Referring Physician: Glendon Axe Physician/Extender: Weeks in Treatment: 27 Verbal / Phone Orders: Yes Clinician: Pinkerton, Debi Read Back and Verified: Yes Diagnosis Coding Wound Cleansing Wound #6 Right,Medial Lower Leg o Clean wound with Normal Saline. Anesthetic Wound #6 Right,Medial Lower Leg o Topical Lidocaine 4% cream applied to wound bed prior to debridement Skin Barriers/Peri-Wound Care Wound #6 Right,Medial Lower Leg o Skin Prep Primary Wound Dressing Wound #6 Right,Medial Lower Leg o Other: - Apligraf Secondary Dressing  Wound #6 Right,Medial Lower Leg o Dry Gauze o Conform/Kerlix - mepitel, steri-strips Dressing Change Frequency Wound #6 Right,Medial Lower Leg o Change dressing every week Follow-up Appointments Wound #6 Right,Medial Lower Leg o Return Appointment in 1 week. Edema Control Wound #6 Right,Medial Lower Leg o Elevate legs to the level of the [name] and pump ankles as often as possible o Other: - compression hose Urbach, Sani [name] ([id number]) Advanced Therapies Wound #6  Right,Medial Lower Leg o Apligraf application in clinic; including contact layer, fixation with steri strips, dry gauze and cover dressing. - Patient instructed to leave dressing in place till next visit. Not to get it wet. Electronic Signature(s) Signed: 03/29/2016 4:01:06 PM By: [name], [name] Signed: 03/29/2016 4:25:01 PM By: Alric Quan Entered By: Alric Quan on 03/29/2016 11:38:31 Keast, NEPHTALIE JAROS ([id number]) -------------------------------------------------------------------------------- Problem List Details Patient Name: JARET, BONANNI 03/29/2016 10:45 Date of Service: AM Medical Record [id number] Number: Patient Account Number: [id number] 11-09-1945 (70 y.o. Treating RN: Ahmed Prima Date of Birth/Sex: Female) Other Clinician: Primary Care Physician: [facility], Delana Meyer Treating [name] Referring Physician: Glendon Axe Physician/Extender: Weeks in Treatment: 60 Active Problems ICD-10 Encounter Code Description Active Date Diagnosis E11.622 Type 2 diabetes mellitus with other skin ulcer 01/31/2015 Yes E66.01 Morbid (severe) obesity due to excess calories 01/31/2015 Yes I89.0 Lymphedema, not elsewhere classified 01/31/2015 Yes L97.212 Non-pressure chronic ulcer of right calf with fat layer 11/24/2015 Yes exposed I87.311 Chronic venous hypertension (idiopathic) with ulcer of 11/24/2015 Yes right lower extremity Inactive Problems Resolved Problems ICD-10 Code Description Active Date Resolved Date L97.322 Non-pressure chronic ulcer of left ankle with fat layer 01/31/2015 01/31/2015 exposed I83.222 Varicose veins of left lower extremity with both ulcer of 03/07/2015 03/07/2015 calf and inflammation I83.223 03/07/2015 03/07/2015 Castrillon, [name] ([id number]) 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 L03.116 Cellulitis of left lower limb 07/07/2015 07/07/2015 Electronic Signature(s) Signed: 03/29/2016 11:47:56 AM By: [name], [name] Entered By: [name] on 03/29/2016 11:47:56 Summerson, [name] Kitchen ([id number]) -------------------------------------------------------------------------------- Progress Note Details Patient Name: [name] 03/29/2016 10:45 Date of Service: AM Medical Record [id number] Number: Patient Account Number: [id number] 16-Aug-1946 (70 y.o. Treating RN: Ahmed Prima Date of Birth/Sex: Female) Other Clinician: Primary Care Physician: [facility], Delana Meyer Treating [name] Referring Physician: Glendon Axe Physician/Extender: Weeks in Treatment: 60 Subjective Chief Complaint Information obtained from Patient Patient presents to the wound care center for a consult due non healing wound 70 year old patient comes with a history of having a ulcer on the left lower extremity for the past 4 weeks. she says she's had swelling of both lower extremities for about a year after she started having prednisone. 02/07/2015 -- her vascular appointments obtained were in the first and third week of June. she is able to go to [place] and we will try and get her some earlier appointments. Other than that nothing else has changed in her management. History of Present Illness (HPI) 70 year old patient who is known to have diabetes mellitus type 2, chronic renal insufficiency, coronary artery disease, hypertension, hypercholesterolemia, temporal arteritis and inflammatory arthritiss also has a history of having a hysterectomy and some orthopedic related surgeries. The ulcer on the left lower extremity started off as a blister and then. Got progressively worse. She does not have any fever or chills and has not had any recent surgical intervention for this. Her last hemoglobin A1c was 10.1 in September 2015. She has been recently put on doxycycline by her PCP. She is  now also allergic to doxycycline and was this was changed over to Keflex. due to her temporal arteritis she has been on prednisone  for about a year and she says ever since that she has had swelling of both lower extremities. She does see a cardiologist and also takes a diuretic. 02/07/2015 her arterial and venous duplex studies to be done have dates been given as the first and third week of June. This is at [facility]. We are going to try and get early appointments at [facility]. other than that nothing has changed in her management. 02/14/2015 -- we have been able to get her an appointment in [facility] on May 20 which is much earlier than her previous ones at [facility]. She continues with her prednisone and her sugars are in the range of 150-200. 02/21/2015 We were able to get a vascular lab workup for her today and she is going to be there at 2:00 this afternoon. the swelling of her leg has gone down significantly but she still has some tenderness over the wounds. 02/28/2015 - She has had one of two vascular workups done, and this coming Tuesday has another, at [facility]. She continues to be on steroid medications. She has significant sensitivity in her left lower extremity and has pain suggestive of neuropathic pain and I have asked her to address this with her primary care physician. 03/07/2015 -- The patient saw Dr. Lucky Cowboy for a consultation and he has had her arteries are okay but she has 2 incompetent veins on the left lower extremity and he is going to set her up for surgery. Official report is [place] ([id number]) awaited. Addendum: Official reports are now available and on 03/04/2015. She was seen and lower extremity venous duplex exam was done. There was reflux present within the left greater saphenous vein below the knee and also the left small saphenous vein. Arterial duplex showed normal triphasic waveforms throughout the left lower extremity without any significant stenosis. Her ABIs were noncompressible bilaterally but a waveforms were normal and a digital pressures were  normal bilaterally consistent with no significant arterial insufficiency. He has recommended endovenous ablation of both the left small saphenous and the left great saphenous vein. This would still be scheduled later. 03/14/2015 -- she has heard back from the vascular office and has surgery scheduled for sometime in July. Her rheumatologist has decreased her prednisone dosage but she still on it. She has also had cataract surgery in her right eye recently this week. 03/20/2015 - No new complaints today. Pain improved. No fever or chills. Tolerating 2 layer compression. 04/14/2015 -- she was doing very well today she went off on vacation and now her edema has increased markedly the ulceration is bigger and her diabetes is not under control. 04/21/2015 -- I spoke to her PCP Dr. Candiss Norse and discussed the management which would include being seen by a general surgeon for debridement and taking multiple punch biopsies which would help in establishing the diagnosis of this is a vasculitis. She is agreeable about this and will set her up for the procedure with Dr. Tamala Julian at [facility]. She was seen by the surgeon Dr. Jamal Collin. 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 She is scheduled to have laser ablation done next week.Pt was sent here for possible biopsy to look for vasculitis. Feel it would be better to wait after laser ablation is  completed- the ulcer may heal fully and biopsy may not be necessary 04/29/2015 -- she had the venous ablation done by Dr. Lucky Cowboy last Friday and we do not have any notes yet. She is doing fine otherwise. 05/06/2015 --Review of her recent vascular intervention shows that she was seen by Dr. Lucky Cowboy on 04/29/2015. The follow-up duplex which was done showed that both the great saphenous vein and the small saphenous vein remained patent with reflux consistent with  an unsuccessful ablation. He has rescheduled her for another the endovenous ablation to be done in about 4 weeks time. 05/13/2015 -- he was seen by her surgeon Dr. Jamal Collin who asked her to continue with conservative therapy and he would speak to Dr. Lucky Cowboy about her management. Dr. Lucky Cowboy is going to schedule her surgery in the middle of August for a repeat endovenous ablation. Her pus culture from last week has grown : [place] her noted her sensitivity report but due to her multiple allergies I had tried clindamycin and she developed a rash with this too. She has been prescribed and anti-buttocks in the ER and is has it at home and she will let is know what she is going to be taking. 05/20/2015 -- she has developed a small spot on her right lower extremity but besides that it is not a full fledged ulceration. She did not get to see Dr. Lucky Cowboy last week and hopefully she will see him in the near future. 05/27/2015 -she is still awaiting her appointment with Dr.Dew and her vascular procedure is not scheduled until August 19. She will be seeing her PCP tomorrow and I have asked her to convey our discussion so that she is aware that debridement has not been done yet. [name], [name] ([id number]) 06/03/2015 -- was seen by her rheumatologist Dr. 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 He is lowering her prednisone to 12-1/2 mg for 1 month and then 10 mg per the next month. I will again make an attempt to speak to her PCP Dr. Candiss Norse and her surgeon Dr. 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 06/17/2015 -- Dr.Dew did her vascular procedure last week and a follow-up venous ultrasound shows  good resolution of the veins as per the patient's history. He is to see her back in 2 weeks. 07/07/2015. -- the patient has had a heavy growth of Proteus mirabilis and Enterococcus faecalis. These are sensitive to several drugs but the problem is she has allergies to all of these and hence I would like her to see Dr. Ola Spurr for this. She is also due to see Dr. Lucky Cowboy tomorrow and I will discussed the management with him including debridement under anesthesia and possible biopsies. 07/14/2015 -- she has an appointment to see Dr. Ola Spurr tomorrow and did see Dr. Bunnie Domino PA who will discuss [name] request with him. 07/21/2015 -- saw Dr. Ola Spurr was able to do a test on her and has put her on amoxicillin. She has been tolerating that and has had no problems with allergies to this. 07/28/2015 -- Last Friday I spoke to Dr. Leotis Pain regarding her care and he said that her right leg did not need any surgery and on the left leg was doing pretty good. We did  agree that if she undergoes any procedure in the future he would do a couple of punch biopsies of the wound. 08/18/2015 -- her right leg is very tender and there is significant amount of slough. The left leg is looking much better 09/02/2015 -- she is going to have a debridement and punch biopsies of her right lower extremity by Dr. Leotis Pain this coming Thursday. Also seen Dr. Ola Spurr who has continued her on ciprofloxacin. 09/09/2015 -- on 09/04/2015 Dr. Leotis Pain took her to surgery - Irrigation and excisional debridement of skin, soft tissue, and muscle to about 40 cm2 to the right posterior calf with biopsy. Pathology results are -- DIAGNOSIS: SKIN, RIGHT LOWER EXTREMITY; BIOPSY: SKIN AND SOFT TISSUE WITH ULCERATION, SEE NOTE. - NEGATIVE FOR DYSPLASIA AND MALIGNANCY. Note: There is acute inflammation and ulceration of the epidermis. The dermis shows nonspecific inflammation, neovascularization, and hemosiderin deposition. The differential  diagnosis for these findings includes stasis dermatitis, nonspecific dermatitis, and infection. Correlation with clinical findings is required. A PAS fungal stain is obtained and results will be reported in an addendum. cultures were also sent and this grew Pseudomonas aeruginosa, Escherichia coli, Proteus mirabilis, Klebsiella pneumoniae and they were all sensitive to ciprofloxacin which she is on. 09/16/2015 -- he saw Dr. 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 He is going to call the patient in and discuss with her the management possibly with Imuran. I will continue treating her locally. 09/23/2015 -- she saw Dr. Ola Spurr today who was going to continue the antibiotics for now and stop after this course. She has an appointment to see Dr. Jefm Bryant in about a week's time. Her wound VAC has arrived but she did not bring it with her today. We will try and set her up for changes to the right leg 3 times a week. She is here for her first application of Apligraf to the left lower extremity. [name], GEORGEFF ([id number]) 09/30/2015 -- she has seen Dr. 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 because she is ambulatory at home health will not apply a wound VAC and she will have to come here 3 times a week on Monday Wednesday and Friday. 10/10/2015 -- she is here for a second application of Apligraf to her left lower extremity. 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 Jefm Bryant. Other than that she is doing well 10/31/2015 -- she is here for a third application of Apligraf to her left lower extremity. after reviewing her wound on the right side I noted that it is granulating extremely well and we will use the remnants of  the Apligraf on the right leg. 11/14/2015 -- she was recently admitted to the hospital between January 11 and 11/10/2015 for uncontrolled hypertension, diabetes mellitus, acute kidney injury. during her admission she was supported by wound care and also by antibiotics. Around this time her immunosuppression was stopped by Dr. Nunzio Cory. She is feeling much better now. 11/21/2015 -- she has had her fourth application of Apligraf today and it has been shared between the left and the right leg 11/24/2015 -- was here for a wound VAC change but it was noticed that she had profuse bleeding from the lobe medial part of her right lower extremity and I was called  in to evaluate her. 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 She has been doing fine since then 12/19/2015 -- she has finished 5 applications of Apligraf to the left lower extremity and this is doing great. He began with pain and swelling of her right ankle a couple of days ago and it has caused her much distress. She does not have any fever or any change in her general health. 12/22/2015 -- the patient's pain and swelling of the right lower extremity has resolved over the weekend. The left lower extremity is completely healed. She has had the first application of Apligraf on the right leg today. 01/02/2016 -- She is here for the second application of Apligraf on the right leg today. 01/19/2016 -- she is here for the third application of Apligraf to the right posterior lateral leg. 02/02/2016 -- today she has had her fourth application of Apligraf and we have applied some of it to the right posterior lateral leg and the remaining waist we have used for the left anterior medial leg. 02/12/2016 -- today she is use the fifth application of Apligraf to the right posterior lateral leg. The remaining to be wasted has been applied to the left anterior medial  leg. 03/01/2016 -- her copayment for the Apligraf is about $300 apiece and she's been to talk to her family to see if she can afford it. 03/15/2016 -- she was able to get some charity care to help with her copayment and we have applied the first Apligraf to her right anterior medial leg. 03/29/2016 -- she is here for a second application to the right anterior medial leg. Objective Constitutional Pulse regular. Respirations normal and unlabored. Afebrile. AKIESHA, DYKEMAN. ([id number]) Vitals Time Taken: 11:05 AM, Height: 65 in, Weight: 248 lbs, BMI: 41.3, Pulse: 87 bpm, Respiratory Rate: 20 breaths/min, Blood Pressure: 137/50 mmHg. Eyes Nonicteric. Reactive to light. Ears, Nose, Mouth, and Throat Lips, teeth, and gums WNL.Marland Kitchen Moist mucosa without lesions. Neck supple and nontender. No palpable supraclavicular or cervical adenopathy. Normal sized without goiter. Respiratory WNL. No retractions.. Cardiovascular Pedal Pulses WNL. No clubbing, cyanosis or edema. Lymphatic No adneopathy. No adenopathy. No adenopathy. Musculoskeletal Adexa without tenderness or enlargement.. Digits and nails w/o clubbing, cyanosis, infection, petechiae, ischemia, or inflammatory conditions.Marland Kitchen Psychiatric Judgement and insight Intact.. No evidence of depression, anxiety, or agitation.. General Notes: the anterior medial wound on the right leg looks healthy and has clean granulation tissue and with the usual precautions then next Apligraf was applied and bolstered in place with Steri-Strips Integumentary (Hair, Skin) No suspicious lesions. No crepitus or fluctuance. No peri-wound warmth or erythema. No masses.. Wound #6 status is Open. Original cause of wound was Gradually Appeared. The wound is located on the Right,Medial Lower Leg. The wound measures 5.4cm length x 3cm width x 0.1cm depth; 12.723cm^2 area and 1.272cm^3 volume. The wound is limited to skin breakdown. There is no tunneling or undermining noted.  There is a medium amount of serous drainage noted. The wound margin is flat and intact. There is large (67-100%) red granulation within the wound bed. There is a small (1-33%) amount of necrotic tissue within the wound bed including Adherent Slough. The periwound skin appearance exhibited: Moist. The periwound skin appearance did not exhibit: Callus, Crepitus, Excoriation, Fluctuance, Friable, Induration, Localized Edema, Rash, Scarring, Dry/Scaly, Maceration, Atrophie Blanche, Cyanosis, Ecchymosis, Hemosiderin Staining, Mottled, Pallor, Rubor, Erythema. Periwound temperature was noted as No  Abnormality. The periwound has tenderness on palpation. 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 A skin graft procedure using a bioengineered skin substitute/cellular or tissue based product was performed by [name], MD. Apligraf was applied and secured with Steri-Strips. 18 sq cm of product was utilized and 26 sq cm was wasted. Post Application, MEPITEL was applied. A Time Out was conducted prior to the start of the procedure. The procedure was tolerated well with a pain level of 0 throughout and a pain level of 0 following the procedure. Post procedure Diagnosis Wound #6: Same as Pre-Procedure . Plan Wound Cleansing: Wound #6 Right,Medial Lower Leg: Clean wound with Normal Saline. Anesthetic: Wound #6 Right,Medial Lower Leg: Topical Lidocaine 4% cream applied to wound bed prior to debridement Skin Barriers/Peri-Wound Care: Wound #6  Right,Medial Lower Leg: Skin Prep Primary Wound Dressing: Wound #6 Right,Medial Lower Leg: Other: - Apligraf Secondary Dressing: Wound #6 Right,Medial Lower Leg: Dry Gauze Sane, Camella [name] ([id number]) Conform/Kerlix - mepitel, steri-strips Dressing Change Frequency: Wound #6 Right,Medial Lower Leg: Change dressing every week Follow-up Appointments: Wound #6 Right,Medial Lower Leg: Return Appointment in 1 week. Edema Control: Wound #6 Right,Medial Lower Leg: Elevate legs to the level of the [name] and pump ankles as often as possible Other: - compression hose Advanced Therapies: Wound #6 Right,Medial Lower Leg: Apligraf application in clinic; including contact layer, fixation with steri strips, dry gauze and cover dressing. - Patient instructed to leave dressing in place till next visit. Not to get it wet. The anterior medial wound is looking very clean and after the usual precautions we have applied Apligraf today. This has been bolstered in place with Steri-Strips and we will apply a appropriate Kerlix and Coban dressing and then she will wear her compression stockings. She will be back next week for a wound check Electronic Signature(s) Signed: 03/29/2016 4:23:49 PM By: [name], [name] Previous Signature: 03/29/2016 11:52:03 AM Version By: [name], [name] Entered By: [name] on 03/29/2016 16:23:49 Ausmus, Zipporah [name] ([id number]) -------------------------------------------------------------------------------- SuperBill Details Patient Name: TIFFANCY, JACKOWSKI [name] Date of Service: 03/29/2016 Medical Record Number: [id number] Patient Account Number: [id number] Date of Birth/Sex: 1946-09-26 (70 y.o. Female) Treating RN: Carolyne Fiscal, Debi Primary Care Physician: Glendon Axe Other Clinician: Referring Physician: Glendon Axe Treating Physician/Extender: Frann Rider in Treatment: 60 Diagnosis Coding ICD-10 Codes Code Description E11.622 Type 2 diabetes mellitus  with other skin ulcer E66.01 Morbid (severe) obesity due to excess calories I89.0 Lymphedema, not elsewhere classified L97.212 Non-pressure chronic ulcer of right calf with fat layer exposed I87.311 Chronic venous hypertension (idiopathic) with ulcer of right lower extremity Facility Procedures CPT4 Code Description: [id number] (Facility Use Only) Apligraf 1 SQ CM Modifier: Quantity: 81 CPT4 Code Description: [id number] - SKIN SUB GRAFT TRNK/ARM/LEG ICD-10 Description Diagnosis E11.622 Type 2 diabetes mellitus with other skin ulcer I89.0 Lymphedema, not elsewhere classified I87.311 Chronic venous hypertension (idiopathic) with ulcer  o L97.212 Non-pressure chronic ulcer of right calf with fat lay Modifier: f right lowe er exposed Quantity: 1 r extremity Physician Procedures CPT4 Code Description: [id number] - WC PHYS SKIN SUB GRAFT  TRNK/ARM/LEG ICD-10 Description Diagnosis E11.622 Type 2 diabetes mellitus with other skin ulcer I89.0 Lymphedema, not elsewhere classified I87.311 Chronic venous hypertension (idiopathic)  with ulcer of L97.212 Non-pressure chronic ulcer of right calf with fat laye Modifier: right lower r exposed Quantity: 1 extremity Electronic Signature(s) Signed: 03/29/2016 4:01:06 PM By: [name], [name] Signed: 03/29/2016 4:25:01 PM By: Van Clines ([id number]) Previous Signature: 03/29/2016 11:52:34 AM Version By: [name], [name] Entered By: Alric Quan on 03/29/2016 12:01:33

## 2016-04-05 ENCOUNTER — Encounter: Payer: Commercial Managed Care - HMO | Admitting: Surgery

## 2016-04-05 DIAGNOSIS — E11622 Type 2 diabetes mellitus with other skin ulcer: Secondary | ICD-10-CM | POA: Diagnosis not present

## 2016-04-06 NOTE — Progress Notes (Signed)
SHEILA, SANDROCK ([id number]) Visit Report for 04/05/2016 Chief Complaint Document Details Patient Name: [name], [name] 04/05/2016 1:30 Date of Service: PM Medical Record [id number] Number: Patient Account Number: [id number] [birth year] (70 y.o. Treating RN: Ahmed Prima Date of Birth/Sex: Female) Other Clinician: Primary Care Physician: [facility], Delana Meyer Treating [name] Referring Physician: Glendon Axe Physician/Extender: Weeks in Treatment: 48 Information Obtained from: Patient Chief Complaint Patient presents to the wound care center for a consult due non healing wound 70 year old patient comes with a history of having a ulcer on the left lower extremity for the past 4 weeks. she says she's had swelling of both lower extremities for about a year after she started having prednisone. 02/07/2015 -- her vascular appointments obtained were in the first and third week of June. she is able to go to [place] and we will try and get her some earlier appointments. Other than that nothing else has changed in her management. Electronic Signature(s) Signed: 04/05/2016 2:05:31 PM By: [name], [name] Entered By: [name] on 04/05/2016 14:05:30 Mruk, [name] ([id number]) -------------------------------------------------------------------------------- HPI Details Patient Name: VALETA, [name] 04/05/2016 1:30 Date of Service: PM Medical Record [id number] Number: Patient Account Number: [id number] 1945/11/21 (70 y.o. Treating RN: Ahmed Prima Date of Birth/Sex: Female) Other Clinician: Primary Care Physician: [facility], Delana Meyer Treating Cutter Passey Referring Physician: Glendon Axe Physician/Extender: Weeks in Treatment: 22 History of Present Illness HPI Description: 70 year old patient who is known to have diabetes mellitus type 2, chronic renal insufficiency, coronary artery disease, hypertension, hypercholesterolemia, temporal arteritis and inflammatory  arthritiss also has a history of having a hysterectomy and some orthopedic related surgeries. The ulcer on the left lower extremity started off as a blister and then. Got progressively worse. She does not have any fever or chills and has not had any recent surgical intervention for this. Her last hemoglobin A1c was 10.1 in September 2015. She has been recently put on doxycycline by her PCP. She is now also allergic to doxycycline and was this was changed over to Keflex. 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 She does see a cardiologist and also takes a diuretic. 02/07/2015 her arterial and venous duplex studies to be done have dates been given as the first and third week of June. This is at [facility]. We are going to try and get early appointments at [facility]. other than that nothing has changed in her management. 02/14/2015 -- we have been able to get her an appointment in [facility] on May 20 which is much earlier than her previous ones at [place]. She continues with her prednisone and her sugars are in the range of 150-200. 02/21/2015 We were able to get a vascular lab workup for her today and she is going to be there at 2:00 this afternoon. the swelling of her leg has gone down significantly but she still has some tenderness over the wounds. 02/28/2015 - She has had one of two vascular workups done, and this coming Tuesday has another, at [place] [facility]. She continues to be on steroid medications. She has significant sensitivity in her left lower extremity and has pain suggestive of neuropathic pain and I have asked her to address this with her primary care physician. 03/07/2015 -- The patient saw Dr. Lucky Cowboy for a consultation and he has had her arteries are okay but she has 2 incompetent veins on the  left lower extremity and he is going to set her up  for surgery. Official report is awaited. Addendum: Official reports are now available and on 03/04/2015. She was seen and lower extremity venous duplex exam was done. There was reflux present within the left greater saphenous vein below the knee and also the left small saphenous vein. Arterial duplex showed normal triphasic waveforms throughout the left lower extremity without any significant stenosis. Her ABIs were noncompressible bilaterally but a waveforms were normal and a digital pressures were normal bilaterally consistent with no significant arterial insufficiency. He has recommended endovenous ablation of both the left small saphenous and the left great saphenous vein. This would still be scheduled later. 03/14/2015 -- she has heard back from the vascular office and has surgery scheduled for sometime in July. [name], [name] ([id number]) Her rheumatologist has decreased her prednisone dosage but she still on it. She has also had cataract surgery in her right eye recently this week. 03/20/2015 - No new complaints today. Pain improved. No fever or chills. Tolerating 2 layer compression. 04/14/2015 -- she was doing very well today she went off on vacation and now her edema has increased markedly the ulceration is bigger and her diabetes is not under control. 04/21/2015 -- I spoke to her PCP Dr. Candiss Norse and discussed the management which would include being seen by a general surgeon for debridement and taking multiple punch biopsies which would help in establishing the diagnosis of this is a vasculitis. She is agreeable about this and will set her up for the procedure with Dr. Tamala Julian at [facility]. She was seen by the surgeon Dr. Jamal Collin. 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 She is scheduled to have laser ablation done next week.Pt was sent here for possible biopsy to  look for vasculitis. Feel it would be better to wait after laser ablation is completed- the ulcer may heal fully and biopsy may not be necessary 04/29/2015 -- she had the venous ablation done by Dr. Lucky Cowboy last Friday and we do not have any notes yet. She is doing fine otherwise. 05/06/2015 --Review of her recent vascular intervention shows that she was seen by Dr. Lucky Cowboy on 04/29/2015. The follow-up duplex which was done showed that both the great saphenous vein and the small saphenous vein remained patent with reflux consistent with an unsuccessful ablation. He has rescheduled her for another the endovenous ablation to be done in about 4 weekso time. 05/13/2015 -- he was seen by her surgeon Dr. Jamal Collin who asked her to continue with conservative therapy and he would speak to Dr. Lucky Cowboy about her management. Dr. Lucky Cowboy is going to schedule her surgery in the middle of August for a repeat endovenous ablation. Her pus culture from last week has grown : [place] her noted her sensitivity report but due to her multiple allergies I had tried clindamycin and she developed a rash with this too. She has been prescribed and anti-buttocks in the ER and is has it at home and she will let is know what she is going to be taking. 05/20/2015 -- she has developed a small spot on her right lower extremity but besides that it is not a full fledged ulceration. She did not get to see Dr. Lucky Cowboy last week and hopefully she will see him in the near future. 05/27/2015 -she is still awaiting her  appointment with Dr.Dew and her vascular procedure is not scheduled until August 19. She will be seeing her PCP tomorrow and I have asked her to convey our discussion so that she is aware that debridement has not been done yet. 06/03/2015 -- was seen by her rheumatologist Dr. Dorthula Matas, who has been treating her for temporal  arteritis and in his note has mentioned the possibility of vasculitis or pyoderma gangrenosum. He is lowering her prednisone to 12-1/2 mg for 1 month and then 10 mg per the next month. I will again make an attempt to speak to her PCP Dr. Candiss Norse and her surgeon Dr. 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 06/17/2015 -- Dr.Dew did her vascular procedure last week and a follow-up venous ultrasound shows good resolution of the veins as per the patient's history. He is to see her back in 2 weeks. [name], [name] ([id number]) 07/07/2015. -- the patient has had a heavy growth of Proteus mirabilis and Enterococcus faecalis. These are sensitive to several drugs but the problem is she has allergies to all of these and hence I would like her to see Dr. Ola Spurr for this. She is also due to see Dr. Lucky Cowboy tomorrow and I will discussed the management with him including debridement under anesthesia and possible biopsies. 07/14/2015 -- she has an appointment to see Dr. Ola Spurr tomorrow and did see Dr. Bunnie Domino PA who will discuss my request with him. 07/21/2015 -- saw Dr. Ola Spurr was able to do a test on her and has put her on amoxicillin. She has been tolerating that and has had no problems with allergies to this. 07/28/2015 -- Last Friday I spoke to Dr. Leotis Pain regarding her care and he said that her right leg did not need any surgery and on the left leg was doing pretty good. We did agree that if she undergoes any procedure in the future he would do a couple of punch biopsies of the wound. 08/18/2015 -- her right leg is very tender and there is significant amount of slough. The left leg is looking much better 09/02/2015 -- she is going to have a debridement and punch biopsies of her right lower extremity by Dr. Leotis Pain this coming Thursday. Also seen Dr. Ola Spurr who has continued her on  ciprofloxacin. 09/09/2015 -- on 09/04/2015 Dr. Leotis Pain took her to surgery - Irrigation and excisional debridement of skin, soft tissue, and muscle to about 40 cm2 to the right posterior calf with biopsy. Pathology results are -- DIAGNOSIS: SKIN, RIGHT LOWER EXTREMITY; BIOPSY: SKIN AND SOFT TISSUE WITH ULCERATION, SEE NOTE. - NEGATIVE FOR DYSPLASIA AND MALIGNANCY. Note: There is acute inflammation and ulceration of the epidermis. The dermis shows nonspecific inflammation, neovascularization, and hemosiderin deposition. The differential diagnosis for these findings includes stasis dermatitis, nonspecific dermatitis, and infection. Correlation with clinical findings is required. A PAS fungal stain is obtained and results will be reported in an addendum. cultures were also sent and this grew Pseudomonas aeruginosa, Escherichia coli, Proteus mirabilis, Klebsiella pneumoniae and they were all sensitive to ciprofloxacin which she is on. 09/16/2015 -- he saw Dr. 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 He is going to call the patient in and discuss with her the management possibly with Imuran. I will continue treating  her locally. 09/23/2015 -- she saw Dr. Ola Spurr today who was going to continue the antibiotics for now and stop after this course. She has an appointment to see Dr. Jefm Bryant in about a week's time. Her wound VAC has arrived but she did not bring it with her today. We will try and set her up for changes to the right leg 3 times a week. She is here for her first application of Apligraf to the left lower extremity. 09/30/2015 -- she has seen Dr. 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 because she is ambulatory at home health will not apply a wound VAC and she will have  to come here 3 times a week on Monday Wednesday and Friday. 10/10/2015 -- she is here for a second application of Apligraf to her left lower extremity. 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 Jefm Bryant. Other than that she is doing well 10/31/2015 -- she is here for a third application of Apligraf to her left lower extremity. after reviewing her wound on the right side I noted that it is granulating extremely well and we will use the remnants of the Apligraf on the right leg. ANNESSA, REICHOW ([id number]) 11/14/2015 -- she was recently admitted to the hospital between January 11 and 11/10/2015 for uncontrolled hypertension, diabetes mellitus, acute kidney injury. during her admission she was supported by wound care and also by antibiotics. Around this time her immunosuppression was stopped by Dr. Nunzio Cory. She is feeling much better now. 11/21/2015 -- she has had her fourth application of Apligraf today and it has been shared between the left and the right leg 11/24/2015 -- was here for a wound VAC change but it was noticed that she had profuse bleeding from the lobe medial part of her right lower extremity and I was called in to evaluate her. 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 She has been doing fine since then 12/19/2015 -- she has finished 5 applications of Apligraf to the left lower extremity and this is doing great. He began with pain and swelling of her right ankle a couple of days ago and it has caused her much distress. She does not have any fever or any change in her general health. 12/22/2015 -- the patient's pain and swelling of the right lower extremity has resolved over the weekend. The left lower extremity is completely healed. She has had the first application of Apligraf on the right leg today. 01/02/2016 -- She is  here for the second application of Apligraf on the right leg today. 01/19/2016 -- she is here for the third application of Apligraf to the right posterior lateral leg. 02/02/2016 -- today she has had her fourth application of Apligraf and we have applied some of it to the right posterior lateral leg and the remaining waist we have used for the left anterior medial leg. 02/12/2016 -- today she is use the fifth application of Apligraf to the right posterior lateral leg. The remaining to be wasted has been applied to the left anterior medial leg. 03/01/2016 -- her copayment for the Apligraf is about $300 apiece and she's been to talk to her family to see if she can afford it. 03/15/2016 -- she was able to get some charity care to help with her  copayment and we have applied the first Apligraf to her right anterior medial leg. 03/29/2016 -- she is here for a second application to the right anterior medial leg. 04/05/2016 -- I had asked her to check with Dr. Jefm Bryant regarding her treatment with her immunosuppression for pyoderma gangrenosum and she has an appointment pending tomorrow. Electronic Signature(s) Signed: 04/05/2016 2:06:33 PM By: [name], [name] Entered By: [name] on 04/05/2016 14:06:33 [name], [name] ([id number]) -------------------------------------------------------------------------------- Physical Exam Details Patient Name: LUCIJA, TOWNSELL D. 04/05/2016 1:30 Date of Service: PM Medical Record [id number] Number: Patient Account Number: [id number] May 25, 1946 (70 y.o. Treating RN: Ahmed Prima Date of Birth/Sex: Female) Other Clinician: Primary Care Physician: [facility], [name] Treating [name] Referring Physician: Glendon Axe Physician/Extender: Weeks in Treatment: 61 Constitutional . Pulse regular. Respirations normal and unlabored. Afebrile. . Eyes Nonicteric. Reactive to light. Ears, Nose, Mouth, and Throat Lips, teeth, and gums WNL.Marland Kitchen Moist mucosa  without lesions. Neck supple and nontender. No palpable supraclavicular or cervical adenopathy. Normal sized without goiter. Respiratory WNL. No retractions.. Cardiovascular Pedal Pulses WNL. No clubbing, cyanosis or edema. Lymphatic No adneopathy. No adenopathy. No adenopathy. Musculoskeletal Adexa without tenderness or enlargement.. Digits and nails w/o clubbing, cyanosis, infection, petechiae, ischemia, or inflammatory conditions.. Integumentary (Hair, Skin) No suspicious lesions. No crepitus or fluctuance. No peri-wound warmth or erythema. No masses.Marland Kitchen Psychiatric Judgement and insight Intact.. No evidence of depression, anxiety, or agitation.. Notes the wound on the anterior medial right leg continues to have the Mepitel and Steri-Strips and we have not unveiled this. the right posterior lateral leg wound is completely healed. Electronic Signature(s) Signed: 04/05/2016 2:07:33 PM By: [name], [name] Entered By: [name] on 04/05/2016 14:07:33 [name], [name] ([id number]) -------------------------------------------------------------------------------- Physician Orders Details Patient Name: [name], [name] 04/05/2016 1:30 Date of Service: PM Medical Record [id number] Number: Patient Account Number: [id number] October 09, 1946 (71 y.o. Treating RN: Ahmed Prima Date of Birth/Sex: Female) Other Clinician: Primary Care Physician: [facility], [name] Treating [name] Referring Physician: Glendon Axe Physician/Extender: Weeks in Treatment: 50 Verbal / Phone Orders: Yes Clinician: Pinkerton, Debi Read Back and Verified: Yes Diagnosis Coding Secondary Dressing Wound #6 Right,Medial Lower Leg o Dry Gauze o Conform/Kerlix o XtraSorb - charcoal Dressing Change Frequency Wound #6 Right,Medial Lower Leg o Change dressing every week Follow-up Appointments Wound #6 Right,Medial Lower Leg o Return Appointment in 1 week. Edema Control Wound #6 Right,Medial  Lower Leg o Elevate legs to the level of the heart and pump ankles as often as possible o Other: - compression hose Advanced Therapies Wound #6 Right,Medial Lower Leg o Apligraf application in clinic; including contact layer, fixation with steri strips, dry gauze and cover dressing. - Patient instructed to leave dressing in place till next visit. Not to get it wet. Electronic Signature(s) Signed: 04/05/2016 4:16:32 PM By: [name], [name] Signed: 04/05/2016 5:25:44 PM By: Alric Quan Entered By: Alric Quan on 04/05/2016 14:03:14 Brandenburger, [name] ([id number]) -------------------------------------------------------------------------------- Problem List Details Patient Name: [name], [name] 04/05/2016 1:30 Date of Service: PM Medical Record [id number] Number: Patient Account Number: [id number] 06/26/1946 (70 y.o. Treating RN: Ahmed Prima Date of Birth/Sex: Female) Other Clinician: Primary Care Physician: Glendon Axe Treating [name] Referring Physician: Glendon Axe Physician/Extender: Weeks in Treatment: 61 Active Problems ICD-10 Encounter Code Description Active Date Diagnosis E11.622 Type 2 diabetes mellitus with other skin ulcer 01/31/2015 Yes E66.01 Morbid (severe) obesity due to excess calories 01/31/2015 Yes I89.0 Lymphedema, not elsewhere classified 01/31/2015 Yes L97.212 Non-pressure chronic ulcer of right calf  with fat layer 11/24/2015 Yes exposed I87.311 Chronic venous hypertension (idiopathic) with ulcer of 11/24/2015 Yes right lower extremity Inactive Problems Resolved Problems ICD-10 Code Description Active Date Resolved Date L97.322 Non-pressure chronic ulcer of left ankle with fat layer 01/31/2015 01/31/2015 exposed [id number] Varicose veins of left lower extremity with both ulcer of 03/07/2015 03/07/2015 calf and inflammation I83.223 03/07/2015 03/07/2015 [name], [name] ([id number]) Varicose veins of left lower extremity with both ulcer  of ankle and inflammation L03.116 Cellulitis of left lower limb 07/07/2015 07/07/2015 Electronic Signature(s) Signed: 04/05/2016 2:05:18 PM By: [name], [name] Entered By: [name] on 04/05/2016 14:05:17 Morandi, Tia Masker ([id number]) -------------------------------------------------------------------------------- Progress Note Details Patient Name: Kristin Stare D. 04/05/2016 1:30 Date of Service: PM Medical Record [id number] Number: Patient Account Number: [id number] 1946/07/22 (70 y.o. Treating RN: Ahmed Prima Date of Birth/Sex: Female) Other Clinician: Primary Care Physician: [facility], Delana Meyer Treating [name] Referring Physician: Glendon Axe Physician/Extender: Weeks in Treatment: 34 Subjective Chief Complaint Information obtained from Patient Patient presents to the wound care center for a consult due non healing wound 70 year old patient comes with a history of having a ulcer on the left lower extremity for the past 4 weeks. she says she's had swelling of both lower extremities for about a year after she started having prednisone. 02/07/2015 -- her vascular appointments obtained were in the first and third week of June. she is able to go to [place] and we will try and get her some earlier appointments. Other than that nothing else has changed in her management. History of Present Illness (HPI) 70 year old patient who is known to have diabetes mellitus type 2, chronic renal insufficiency, coronary artery disease, hypertension, hypercholesterolemia, temporal arteritis and inflammatory arthritiss also has a history of having a hysterectomy and some orthopedic related surgeries. The ulcer on the left lower extremity started off as a blister and then. Got progressively worse. She does not have any fever or chills and has not had any recent surgical intervention for this. Her last hemoglobin A1c was 10.1 in September 2015. She has been recently put on doxycycline  by her PCP. She is now also allergic to doxycycline and was this was changed over to Keflex. 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 She does see a cardiologist and also takes a diuretic. 02/07/2015 her arterial and venous duplex studies to be done have dates been given as the first and third week of June. This is at [facility]. We are going to try and get early appointments at [facility]. other than that nothing has changed in her management. 02/14/2015 -- we have been able to get her an appointment in [facility] on May 20 which is much earlier than her previous ones at [place]. She continues with her prednisone and her sugars are in the range of 150-200. 02/21/2015 We were able to get a vascular lab workup for her today and she is going to be there at 2:00 this afternoon. the swelling of her leg has gone down significantly but she still has some tenderness over the wounds. 02/28/2015 - She has had one of two vascular workups done, and this coming Tuesday has another, at [place] [facility]. She continues to be on steroid medications. She has significant sensitivity in her left lower extremity and has pain suggestive of neuropathic pain and I have asked her to address this with her primary  care physician. 03/07/2015 -- The patient saw Dr. Lucky Cowboy for a consultation and he has had her arteries are okay but she has 2 incompetent veins on the left lower extremity and he is going to set her up for surgery. Official report is HAPPY, BALIAN ([id number]) awaited. Addendum: Official reports are now available and on 03/04/2015. She was seen and lower extremity venous duplex exam was done. There was reflux present within the left greater saphenous vein below the knee and also the left small saphenous vein. Arterial duplex showed normal triphasic waveforms throughout the  left lower extremity without any significant stenosis. Her ABIs were noncompressible bilaterally but a waveforms were normal and a digital pressures were normal bilaterally consistent with no significant arterial insufficiency. He has recommended endovenous ablation of both the left small saphenous and the left great saphenous vein. This would still be scheduled later. 03/14/2015 -- she has heard back from the vascular office and has surgery scheduled for sometime in July. Her rheumatologist has decreased her prednisone dosage but she still on it. She has also had cataract surgery in her right eye recently this week. 03/20/2015 - No new complaints today. Pain improved. No fever or chills. Tolerating 2 layer compression. 04/14/2015 -- she was doing very well today she went off on vacation and now her edema has increased markedly the ulceration is bigger and her diabetes is not under control. 04/21/2015 -- I spoke to her PCP Dr. Candiss Norse and discussed the management which would include being seen by a general surgeon for debridement and taking multiple punch biopsies which would help in establishing the diagnosis of this is a vasculitis. She is agreeable about this and will set her up for the procedure with Dr. Tamala Julian at [facility]. She was seen by the surgeon Dr. Jamal Collin. 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 She is scheduled to have laser ablation done next week.Pt was sent here for possible biopsy to look for vasculitis. Feel it would be better to wait after laser ablation is completed- the ulcer may heal fully and biopsy may not be necessary 04/29/2015 -- she had the venous ablation done by Dr. Lucky Cowboy last Friday and we do not have any notes yet. She is doing fine otherwise. 05/06/2015 --Review of her recent vascular intervention shows that she was seen by Dr. Lucky Cowboy  on 04/29/2015. The follow-up duplex which was done showed that both the great saphenous vein and the small saphenous vein remained patent with reflux consistent with an unsuccessful ablation. He has rescheduled her for another the endovenous ablation to be done in about 4 weeks time. 05/13/2015 -- he was seen by her surgeon Dr. Jamal Collin who asked her to continue with conservative therapy and he would speak to Dr. Lucky Cowboy about her management. Dr. Lucky Cowboy is going to schedule her surgery in the middle of August for a repeat endovenous ablation. Her pus culture from last week has grown : [place] her noted her sensitivity report but due to her multiple allergies I had tried clindamycin and she developed a rash with this too. She has been prescribed and anti-buttocks in the ER and is has it at home and she will let is know what she is going to be taking. 05/20/2015 -- she has developed a small spot on her right lower extremity but besides that it is not a full  fledged ulceration. She did not get to see Dr. Lucky Cowboy last week and hopefully she will see him in the near future. 05/27/2015 -she is still awaiting her appointment with Dr.Dew and her vascular procedure is not scheduled until August 19. She will be seeing her PCP tomorrow and I have asked her to convey our discussion so that she is aware that debridement has not been done yet. DERYKAH, LANZI ([id number]) 06/03/2015 -- was seen by her rheumatologist Dr. 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 He is lowering her prednisone to 12-1/2 mg for 1 month and then 10 mg per the next month. I will again make an attempt to speak to her PCP Dr. Candiss Norse and her surgeon Dr. Lucky Cowboy to see if he can organize for a debridement in the OR with multiple biopsies to  establish a diagnosis of vasculitis or pyoderma gangrenosum. 06/17/2015 -- Dr.Dew did her vascular procedure last week and a follow-up venous ultrasound shows good resolution of the veins as per the patient's history. He is to see her back in 2 weeks. 07/07/2015. -- the patient has had a heavy growth of Proteus mirabilis and Enterococcus faecalis. These are sensitive to several drugs but the problem is she has allergies to all of these and hence I would like her to see Dr. Ola Spurr for this. She is also due to see Dr. Lucky Cowboy tomorrow and I will discussed the management with him including debridement under anesthesia and possible biopsies. 07/14/2015 -- she has an appointment to see Dr. Ola Spurr tomorrow and did see Dr. Bunnie Domino PA who will discuss my request with him. 07/21/2015 -- saw Dr. Ola Spurr was able to do a test on her and has put her on amoxicillin. She has been tolerating that and has had no problems with allergies to this. 07/28/2015 -- Last Friday I spoke to Dr. Leotis Pain regarding her care and he said that her right leg did not need any surgery and on the left leg was doing pretty good. We did agree that if she undergoes any procedure in the future he would do a couple of punch biopsies of the wound. 08/18/2015 -- her right leg is very tender and there is significant amount of slough. The left leg is looking much better 09/02/2015 -- she is going to have a debridement and punch biopsies of her right lower extremity by Dr. Leotis Pain this coming Thursday. Also seen Dr. Ola Spurr who has continued her on ciprofloxacin. 09/09/2015 -- on 09/04/2015 Dr. Leotis Pain took her to surgery - Irrigation and excisional debridement of skin, soft tissue, and muscle to about 40 cm2 to the right posterior calf with biopsy. Pathology results are -- DIAGNOSIS: SKIN, RIGHT LOWER EXTREMITY; BIOPSY: SKIN AND SOFT TISSUE WITH ULCERATION, SEE NOTE. - NEGATIVE FOR DYSPLASIA AND MALIGNANCY. Note: There is  acute inflammation and ulceration of the epidermis. The dermis shows nonspecific inflammation, neovascularization, and hemosiderin deposition. The differential diagnosis for these findings includes stasis dermatitis, nonspecific dermatitis, and infection. Correlation with clinical findings is required. A PAS fungal stain is obtained and results will be reported in an addendum. cultures were also sent and this grew Pseudomonas aeruginosa, Escherichia coli, Proteus mirabilis, Klebsiella pneumoniae and they were all sensitive to ciprofloxacin which she is on. 09/16/2015 -- he saw Dr. 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 He is going to call the patient in and discuss with her the management possibly with Imuran. I will continue treating her locally. 09/23/2015 -- she saw Dr. Ola Spurr today who was going to continue the antibiotics for now and stop after this course. She has an appointment to see Dr. Jefm Bryant in about a week's time. Her wound VAC has arrived but she did not bring it with her today. We will try and set her up for changes to the right leg 3 times a week. She is here for her first application of Apligraf to the left lower extremity. ARENI, SILERIO ([id number]) 09/30/2015 -- she has seen Dr. 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 because she is ambulatory at home health will not apply a wound VAC and she will have to come here 3 times a week on Monday Wednesday and Friday. 10/10/2015 -- she is here for a second application of Apligraf to her left lower extremity. 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 Jefm Bryant. Other than that she is doing well 10/31/2015 -- she is here for a third application of  Apligraf to her left lower extremity. after reviewing her wound on the right side I noted that it is granulating extremely well and we will use the remnants of the Apligraf on the right leg. 11/14/2015 -- she was recently admitted to the hospital between January 11 and 11/10/2015 for uncontrolled hypertension, diabetes mellitus, acute kidney injury. during her admission she was supported by wound care and also by antibiotics. Around this time her immunosuppression was stopped by Dr. Nunzio Cory. She is feeling much better now. 11/21/2015 -- she has had her fourth application of Apligraf today and it has been shared between the left and the right leg 11/24/2015 -- was here for a wound VAC change but it was noticed that she had profuse bleeding from the lobe medial part of her right lower extremity and I was called in to evaluate her. 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 She has been doing fine since then 12/19/2015 -- she has finished 5 applications of Apligraf to the left lower extremity and this is doing great. He began with pain and swelling of her right ankle a couple of days ago and it has caused her much distress. She does not have any fever or any change in her general health. 12/22/2015 -- the patient's pain and swelling of the right lower extremity has resolved over the weekend. The left lower extremity is completely healed. She has had the first application of Apligraf on the right leg today. 01/02/2016 -- She is here for the second application of Apligraf on the right leg today. 01/19/2016 -- she is here for the third application of Apligraf to the right posterior lateral leg. 02/02/2016 -- today she has had her fourth application of Apligraf and we have applied some of it to the right posterior lateral leg and the remaining waist we have used for the left anterior medial leg. 02/12/2016 --  today she is use the fifth application of Apligraf to the right posterior lateral leg. The remaining to be wasted has been applied to the left anterior medial leg. 03/01/2016 -- her copayment for the Apligraf is about $300 apiece and  she's been to talk to her family to see if she can afford it. 03/15/2016 -- she was able to get some charity care to help with her copayment and we have applied the first Apligraf to her right anterior medial leg. 03/29/2016 -- she is here for a second application to the right anterior medial leg. 04/05/2016 -- I had asked her to check with Dr. Jefm Bryant regarding her treatment with her immunosuppression for pyoderma gangrenosum and she has an appointment pending tomorrow. Objective Mcclory, Jayanna D. ([id number]) Constitutional Pulse regular. Respirations normal and unlabored. Afebrile. Vitals Time Taken: 1:51 PM, Height: 65 in, Weight: 248 lbs, BMI: 41.3, Temperature: 97.6 F, Pulse: 77 bpm, Respiratory Rate: 20 breaths/min, Blood Pressure: 154/65 mmHg. Eyes Nonicteric. Reactive to light. Ears, Nose, Mouth, and Throat Lips, teeth, and gums WNL.Marland Kitchen Moist mucosa without lesions. Neck supple and nontender. No palpable supraclavicular or cervical adenopathy. Normal sized without goiter. Respiratory WNL. No retractions.. Cardiovascular Pedal Pulses WNL. No clubbing, cyanosis or edema. Lymphatic No adneopathy. No adenopathy. No adenopathy. Musculoskeletal Adexa without tenderness or enlargement.. Digits and nails w/o clubbing, cyanosis, infection, petechiae, ischemia, or inflammatory conditions.Marland Kitchen Psychiatric Judgement and insight Intact.. No evidence of depression, anxiety, or agitation.. General Notes: the wound on the anterior medial right leg continues to have the Mepitel and Steri-Strips and we have not unveiled this. the right posterior lateral leg wound is completely healed. Integumentary (Hair, Skin) No suspicious lesions. No crepitus or fluctuance. No  peri-wound warmth or erythema. No masses.. Wound #6 status is Open. Original cause of wound was Gradually Appeared. The wound is located on the Right,Medial Lower Leg. The wound is limited to skin breakdown. There is no tunneling or undermining noted. There is a large amount of serous drainage noted. The wound margin is flat and intact. There is large (67-100%) red granulation within the wound bed. There is a small (1-33%) amount of necrotic tissue within the wound bed including Adherent Slough. The periwound skin appearance exhibited: Moist. The periwound skin appearance did not exhibit: Callus, Crepitus, Excoriation, Fluctuance, Friable, Induration, Localized Edema, Rash, Scarring, Dry/Scaly, Maceration, Atrophie Blanche, Cyanosis, Ecchymosis, Hemosiderin Staining, Mottled, Pallor, Rubor, Erythema. Periwound temperature was noted as No Abnormality. The periwound has tenderness on palpation. General Notes: did not measure this is a apligraph check. 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 Edema Control: Wound #6 Right,Medial Lower Leg: Elevate legs to the level of the heart and pump ankles as often as possible Other: - compression hose Advanced Therapies: Wound #6 Right,Medial Lower Leg: Apligraf application in clinic; including contact layer, fixation with steri strips,  dry gauze and cover dressing. - Patient instructed to leave dressing in place till next visit. Not to get it wet. We will leave the Apligraf on for another week and continue the compression. We will await Dr. Scharlene Gloss decision regarding her azathioprine. Next week and will take a decision whether she needs any more Apligraf. SHELBYLYN, SHIMMIN ([id number]) Electronic Signature(s) Signed: 04/05/2016 2:09:18 PM By: Con Memos,  Roderick Pee MD, [name] Entered By: [name] on 04/05/2016 14:09:18 Treat, Emmett D. ([id number]) -------------------------------------------------------------------------------- SuperBill Details Patient Name: Kristin Stare D. Date of Service: 04/05/2016 Medical Record Number: [id number] Patient Account Number: [id number] Date of Birth/Sex: 1946/02/28 (70 y.o. Female) Treating RN: Carolyne Fiscal, Debi Primary Care Physician: Glendon Axe Other Clinician: Referring Physician: Glendon Axe Treating Physician/Extender: Frann Rider in Treatment: 61 Diagnosis Coding ICD-10 Codes Code Description E11.622 Type 2 diabetes mellitus with other skin ulcer E66.01 Morbid (severe) obesity due to excess calories I89.0 Lymphedema, not elsewhere classified L97.212 Non-pressure chronic ulcer of right calf with fat layer exposed I87.311 Chronic venous hypertension (idiopathic) with ulcer of right lower extremity Facility Procedures CPT4 Code: [id number] Description: [id number] - WOUND CARE VISIT-LEV 2 EST PT Modifier: Quantity: 1 Physician Procedures CPT4 Code Description: [id number] - WC PHYS LEVEL 3 - EST PT ICD-10 Description Diagnosis E11.622 Type 2 diabetes mellitus with other skin ulcer E66.01 Morbid (severe) obesity due to excess calories I89.0 Lymphedema, not elsewhere classified I87.311  Chronic venous hypertension (idiopathic) with ulcer Modifier: of right lower Quantity: 1 extremity Electronic Signature(s) Signed: 04/05/2016 5:25:44 PM By: Alric Quan Previous  Signature: 04/05/2016 2:09:33 PM Version By: [name], [name] Entered By: Alric Quan on 04/05/2016 16:43:09

## 2016-04-06 NOTE — Progress Notes (Signed)
[name], [name] ([id number]) Visit Report for 04/05/2016 Arrival Information Details Patient Name: [name], [name] Date of Service: 04/05/2016 1:30 PM Medical Record Number: [id number] Patient Account Number: [id number] Date of Birth/Sex: 1946/04/12 (70 y.o. Female) Treating RN: Carolyne Fiscal, Debi Primary Care Physician: Glendon Axe Other Clinician: Referring Physician: Glendon Axe Treating Physician/Extender: Frann Rider in Treatment: 70 Visit Information History Since Last Visit All ordered tests and consults were completed: No Patient Arrived: Cane Added or deleted any medications: No Arrival Time: 13:50 Any new allergies or adverse reactions: No Accompanied By: self Had a fall or experienced change in No Transfer Assistance: None activities of daily living that may affect Patient Identification Verified: Yes risk of falls: Secondary Verification Process Yes Signs or symptoms of abuse/neglect since last No Completed: visito Patient Requires Transmission- No Hospitalized since last visit: No Based Precautions: Pain Present Now: No Patient Has Alerts: Yes Patient Alerts: Patient on Blood Thinner Electronic Signature(s) Signed: 04/05/2016 5:25:44 PM By: Alric Quan Entered By: Alric Quan on 04/05/2016 13:51:39 [name], [name] ([id number]) -------------------------------------------------------------------------------- Clinic Level of Care Assessment Details Patient Name: [name], [name] Date of Service: 04/05/2016 1:30 PM Medical Record Number: [id number] Patient Account Number: [id number] Date of Birth/Sex: 01-23-1946 (70 y.o. Female) Treating RN: Carolyne Fiscal, Debi Primary Care Physician: Glendon Axe Other Clinician: Referring Physician: Glendon Axe Treating Physician/Extender: Frann Rider in Treatment: 11 Clinic Level of Care Assessment Items TOOL 4 Quantity Score X - Use when only an EandM is performed on FOLLOW-UP visit 1  0 ASSESSMENTS - Nursing Assessment / Reassessment X - Reassessment of Co-morbidities (includes updates in patient status) 1 10 X - Reassessment of Adherence to Treatment Plan 1 5 ASSESSMENTS - Wound and Skin Assessment / Reassessment X - Simple Wound Assessment / Reassessment - one wound 1 5 []  - Complex Wound Assessment / Reassessment - multiple wounds 0 []  - Dermatologic / Skin Assessment (not related to wound area) 0 ASSESSMENTS - Focused Assessment []  - Circumferential Edema Measurements - multi extremities 0 []  - Nutritional Assessment / Counseling / Intervention 0 []  - Lower Extremity Assessment (monofilament, tuning fork, pulses) 0 []  - Peripheral Arterial Disease Assessment (using hand held doppler) 0 ASSESSMENTS - Ostomy and/or Continence Assessment and Care []  - Incontinence Assessment and Management 0 []  - Ostomy Care Assessment and Management (repouching, etc.) 0 PROCESS - Coordination of Care X - Simple Patient / Family Education for ongoing care 1 15 []  - Complex (extensive) Patient / Family Education for ongoing care 0 []  - Staff obtains Programmer, systems, Records, Test Results / Process Orders 0 []  - Staff telephones HHA, Nursing Homes / Clarify orders / etc 0 []  - Routine Transfer to another Facility (non-emergent condition) 0 Warnick, Leylanie D. ([id number]) []  - Routine Hospital Admission (non-emergent condition) 0 []  - New Admissions / Biomedical engineer / Ordering NPWT, Apligraf, etc. 0 []  - Emergency Hospital Admission (emergent condition) 0 X - Simple Discharge Coordination 1 10 []  - Complex (extensive) Discharge Coordination 0 PROCESS - Special Needs []  - Pediatric / Minor Patient Management 0 []  - Isolation Patient Management 0 []  - Hearing / Language / Visual special needs 0 []  - Assessment of Community assistance (transportation, D/C planning, etc.) 0 []  - Additional assistance / Altered mentation 0 []  - Support Surface(s) Assessment (bed, cushion, seat, etc.)  0 INTERVENTIONS - Wound Cleansing / Measurement []  - Simple Wound Cleansing - one wound 0 []  - Complex Wound Cleansing - multiple wounds 0 X - Wound Imaging (photographs - any  number of wounds) 1 5 []  - Wound Tracing (instead of photographs) 0 []  - Simple Wound Measurement - one wound 0 []  - Complex Wound Measurement - multiple wounds 0 INTERVENTIONS - Wound Dressings []  - Small Wound Dressing one or multiple wounds 0 []  - Medium Wound Dressing one or multiple wounds 0 []  - Large Wound Dressing one or multiple wounds 0 []  - Application of Medications - topical 0 []  - Application of Medications - injection 0 INTERVENTIONS - Miscellaneous []  - External ear exam 0 [name], [name] ([id number]) []  - Specimen Collection (cultures, biopsies, blood, body fluids, etc.) 0 []  - Specimen(s) / Culture(s) sent or taken to Lab for analysis 0 []  - Patient Transfer (multiple staff / [name]) 0 []  - Simple Staple / Suture removal (25 or less) 0 []  - Complex Staple / Suture removal (26 or more) 0 []  - Hypo / Hyperglycemic Management (close monitor of Blood Glucose) 0 []  - Ankle / Brachial Index (ABI) - do not check if billed separately 0 X - Vital Signs 1 5 Has the patient been seen at the hospital within the last three years: Yes Total Score: 55 Level Of Care: New/Established - Level 2 Electronic Signature(s) Signed: 04/05/2016 5:25:44 PM By: Alric Quan Entered By: Alric Quan on 04/05/2016 16:43:00 [name], [name] ([id number]) -------------------------------------------------------------------------------- Encounter Discharge Information Details Patient Name: [name] D. Date of Service: 04/05/2016 1:30 PM Medical Record Number: [id number] Patient Account Number: [id number] Date of Birth/Sex: 17-Jul-1946 (70 y.o. Female) Treating RN: Carolyne Fiscal, Debi Primary Care Physician: Glendon Axe Other Clinician: Referring Physician: Glendon Axe Treating  Physician/Extender: Frann Rider in Treatment: 25 Encounter Discharge Information Items Discharge Pain Level: 0 Discharge Condition: Stable Ambulatory Status: Cane Discharge Destination: Home Transportation: Private Auto Accompanied By: self Schedule Follow-up Appointment: Yes Medication Reconciliation completed Yes and provided to Patient/Care Bill Yohn: Provided on Clinical Summary of Care: 04/05/2016 Form Type Recipient Paper Patient LM Electronic Signature(s) Signed: 04/05/2016 2:14:45 PM By: Ruthine Dose Entered By: Ruthine Dose on 04/05/2016 14:14:45 [name], [name] ([id number]) -------------------------------------------------------------------------------- Lower Extremity Assessment Details Patient Name: [name] D. Date of Service: 04/05/2016 1:30 PM Medical Record Number: [id number] Patient Account Number: [id number] Date of Birth/Sex: [birth year] (70 y.o. Female) Treating RN: Carolyne Fiscal, Debi Primary Care Physician: Glendon Axe Other Clinician: Referring Physician: Glendon Axe Treating Physician/Extender: Frann Rider in Treatment: 61 Vascular Assessment Pulses: Posterior Tibial Dorsalis Pedis Palpable: [Right:Yes] Extremity colors, hair growth, and conditions: Extremity Color: [Right:Hyperpigmented] Temperature of Extremity: [Right:Warm] Electronic Signature(s) Signed: 04/05/2016 5:25:44 PM By: Alric Quan Entered By: Alric Quan on 04/05/2016 13:54:02 [name], [name] ([id number]) -------------------------------------------------------------------------------- Multi Wound Chart Details Patient Name: [name] D. Date of Service: 04/05/2016 1:30 PM Medical Record Number: [id number] Patient Account Number: [id number] Date of Birth/Sex: 07-31-1946 (70 y.o. 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] [N/A:N/A] Comorbid History: [6:Cataracts, Asthma, Coronary Artery Disease, Hypertension, Type II Diabetes, Osteoarthritis, Neuropathy] [N/A:N/A] Date Acquired: [6:10/10/2015] [N/A:N/A] Weeks of Treatment: [6:25] [N/A:N/A] Wound Status: [6:Open] [N/A:N/A] Classification: [6:Grade 1] [N/A:N/A] Exudate Amount: [6:Large] [N/A:N/A] Exudate Type: [6:Serous] [N/A:N/A] Exudate Color: [6:amber] [N/A:N/A] Wound Margin: [6:Flat and  Intact] [N/A:N/A] Granulation Amount: [6:Large (67-100%)] [N/A:N/A] Granulation Quality: [6:Red] [N/A:N/A] Necrotic Amount: [6:Small (1-33%)] [N/A:N/A] Exposed Structures: [6:Fascia: No Fat: No Tendon: No Muscle: No Joint: No Bone: No Limited to Skin Breakdown] [N/A:N/A] Epithelialization: [6:None] [N/A:N/A] Periwound Skin Texture: Edema: No N/A N/A Excoriation: No Induration: No Callus: No Crepitus: No Fluctuance: No Friable: No Rash: No Scarring: No Periwound Skin Moist: Yes N/A N/A Moisture: Maceration: No Dry/Scaly: No Periwound Skin Color: Atrophie Blanche: No N/A N/A Cyanosis: No Ecchymosis: No Erythema: No Hemosiderin Staining: No Mottled: No Pallor: No Rubor: No Temperature: No Abnormality N/A N/A Tenderness on Yes N/A N/A Palpation: Wound Preparation: Ulcer Cleansing: N/A N/A Rinsed/Irrigated with Saline Topical Anesthetic Applied: Other: lidocaine 4% Assessment Notes: did not measure this is a N/A N/A apligraph check. Treatment Notes Electronic Signature(s) Signed: 04/05/2016 5:25:44 PM By: Alric Quan Entered By:  Alric Quan on 04/05/2016 13:58:45 [name], [name] ([id number]) -------------------------------------------------------------------------------- Multi-Disciplinary Care Plan Details Patient Name: [name], [name] Date of Service: 04/05/2016 1:30 PM Medical Record Number: [id number] Patient Account Number: [id number] Date of Birth/Sex: July 14, 1946 (70 y.o. Female) Treating RN: Carolyne Fiscal, Debi Primary Care Physician: Glendon Axe Other Clinician: Referring Physician: Glendon Axe Treating Physician/Extender: Frann Rider in Treatment: 43 Active Inactive Orientation to the Wound Care Program Nursing Diagnoses: Knowledge deficit related to the wound healing center program Goals: Patient/caregiver will verbalize understanding of the [place] Program Date Initiated: 01/31/2015 Goal Status: Active Interventions: Provide education on orientation to the wound center Notes: Venous Leg Ulcer Nursing Diagnoses: Potential for venous Insuffiency (use before diagnosis confirmed) Goals: Non-invasive venous studies are completed as ordered Date Initiated: 01/31/2015 Goal Status: Active Patient/caregiver will verbalize understanding of disease process and disease management Date Initiated: 01/31/2015 Goal Status: Active Interventions: Assess peripheral edema status every visit. Notes: Wound/Skin Impairment Nursing Diagnoses: Impaired tissue integrity Knowledge deficit related to smoking impact on wound healing Karnes, Levonia D. ([id number]) Goals: Patient/caregiver will verbalize understanding of skin care regimen Date Initiated: 01/31/2015 Goal Status: Active Ulcer/skin breakdown will heal within 14 weeks Date Initiated: 01/31/2015 Goal Status: Active Interventions: Assess ulceration(s) every visit Notes: Electronic Signature(s) Signed: 04/05/2016 5:25:44 PM By: Alric Quan Entered By: Alric Quan on 04/05/2016 13:58:38 [name], Yarieliz D.  ([id number]) -------------------------------------------------------------------------------- Pain Assessment Details Patient Name: [name] D. Date of Service: 04/05/2016 1:30 PM Medical Record Number: [id number] Patient Account Number: [id number] Date of Birth/Sex: 07-25-1946 (70 y.o. Female) Treating RN: Carolyne Fiscal, Debi Primary Care Physician: Glendon Axe Other Clinician: Referring Physician: Glendon Axe Treating Physician/Extender: Frann Rider in Treatment: 75 Active Problems Location of Pain Severity and Description of Pain Patient Has Paino No Site Locations Pain Management and Medication Current Pain Management: Electronic Signature(s) Signed: 04/05/2016 5:25:44 PM By: Alric Quan Entered By: Alric Quan on 04/05/2016 13:51:47 Gudger, Coline D. ([id number]) -------------------------------------------------------------------------------- Patient/Caregiver Education Details Patient Name: [name] D. Date of Service: 04/05/2016 1:30 PM Medical Record Number: [id number] Patient Account Number: [id number] Date of Birth/Gender: 1946/07/01 (70 y.o. Female) Treating RN: Carolyne Fiscal, Debi Primary Care Physician: Glendon Axe Other Clinician: Referring Physician: Glendon Axe Treating Physician/Extender: Frann Rider in Treatment: 10 Education Assessment Education Provided To: Patient Education Topics Provided Wound/Skin Impairment: Handouts: Other: leave dressing in place and do not get week Methods: Demonstration, Explain/Verbal Responses: State content correctly Electronic Signature(s) Signed: 04/05/2016 5:25:44 PM By: Alric Quan Entered By: Alric Quan on 04/05/2016 14:05:27 Hommel, Ashby D. ([id number]) -------------------------------------------------------------------------------- Wound Assessment Details Patient Name: [name] D. Date of Service: 04/05/2016 1:30 PM Medical Record Number: [id number] Patient  Account  Number: [id number] Date of Birth/Sex: 1945-10-29 (70 y.o. Female) Treating RN: Carolyne Fiscal, Debi Primary Care Physician: Glendon Axe Other Clinician: Referring Physician: Glendon Axe Treating Physician/Extender: Frann Rider in Treatment: 61 Wound Status Wound Number: 6 Primary Diabetic Wound/Ulcer of the Lower Etiology: Extremity Wound Location: Right Lower Leg - Medial Wound Open Wounding Event: Gradually Appeared Status: Date Acquired: 10/10/2015 Comorbid Cataracts, Asthma, Coronary Artery Weeks Of Treatment: 25 History: Disease, Hypertension, Type II Clustered Wound: No Diabetes, Osteoarthritis, Neuropathy Photos Photo Uploaded By: Alric Quan on 04/05/2016 16:47:00 Wound Measurements % Reduction in % Reduction in Epithelializati Tunneling: Undermining: Area: Volume: on: None No No Wound Description Classification: Grade 1 Foul Odor After Wound Margin: Flat and Intact Exudate Amount: Large Exudate Type: Serous Exudate Color: amber Cleansing: No Wound Bed Granulation Amount: Large (67-100%) Exposed Structure Granulation Quality: Red Fascia Exposed: No Necrotic Amount: Small (1-33%) Fat Layer Exposed: No Necrotic Quality: Adherent Slough Tendon Exposed: No Dillon, Prim D. ([id number]) Muscle Exposed: No Joint Exposed: No Bone Exposed: No Limited to Skin Breakdown Periwound Skin Texture Texture Color No Abnormalities Noted: No No Abnormalities Noted: No Callus: No Atrophie Blanche: No Crepitus: No Cyanosis: No Excoriation: No Ecchymosis: No Fluctuance: No Erythema: No Friable: No Hemosiderin Staining: No Induration: No Mottled: No Localized Edema: No Pallor: No Rash: No Rubor: No Scarring: No Temperature / Pain Moisture Temperature: No Abnormality No Abnormalities Noted: No Tenderness on Palpation: Yes Dry / Scaly: No Maceration: No Moist: Yes Wound Preparation Ulcer Cleansing: Rinsed/Irrigated with Saline Topical  Anesthetic Applied: Other: lidocaine 4%, Assessment Notes did not measure this is a apligraph check. Treatment Notes Wound #6 (Right, Medial Lower Leg) 5. Secondary Dressing Applied ABD Pad Dry Gauze Kerlix/Conform 7. Secured with Tape Notes xtrasorb, charcoal, APLIGRAPG still in place, netting Electronic Signature(s) Signed: 04/05/2016 5:25:44 PM By: Alric Quan Entered By: Alric Quan on 04/05/2016 13:58:15 Palmer, Violia D. ([id number]) -------------------------------------------------------------------------------- Vitals Details Patient Name: [name] D. Date of Service: 04/05/2016 1:30 PM Medical Record Number: [id number] Patient Account Number: [id number] Date of Birth/Sex: [birth year] (70 y.o. Female) Treating RN: Carolyne Fiscal, Debi Primary Care Physician: Glendon Axe Other Clinician: Referring Physician: Glendon Axe Treating Physician/Extender: Frann Rider in Treatment: 59 Vital Signs Time Taken: 13:51 Temperature (F): 97.6 Height (in): 65 Pulse (bpm): 77 Weight (lbs): 248 Respiratory Rate (breaths/min): 20 Body Mass Index (BMI): 41.3 Blood Pressure (mmHg): 154/65 Reference Range: 80 - 120 mg / dl Electronic Signature(s) Signed: 04/05/2016 5:25:44 PM By: Alric Quan Entered By: Alric Quan on 04/05/2016 13:53:36

## 2016-04-09 ENCOUNTER — Other Ambulatory Visit: Payer: Self-pay | Admitting: Internal Medicine

## 2016-04-09 DIAGNOSIS — Z1239 Encounter for other screening for malignant neoplasm of breast: Secondary | ICD-10-CM

## 2016-04-12 ENCOUNTER — Encounter: Payer: Commercial Managed Care - HMO | Admitting: Surgery

## 2016-04-12 DIAGNOSIS — E11622 Type 2 diabetes mellitus with other skin ulcer: Secondary | ICD-10-CM | POA: Diagnosis not present

## 2016-04-14 NOTE — Progress Notes (Signed)
[name], [name] ([id number]) Visit Report for 04/12/2016 Chief Complaint Document Details Patient Name: [name], [name] 04/12/2016 10:45 Date of Service: AM Medical Record [id number] Number: Patient Account Number: [id number] 21-Mar-1946 (70 y.o. Treating RN: [name] Date of Birth/Sex: Female) Other Clinician: Primary Care Physician: [facility], Delana Meyer Treating [name] Referring Physician: Glendon Axe Physician/Extender: Weeks in Treatment: 51 Information Obtained from: Patient Chief Complaint Patient presents to the wound care center for a consult due non healing wound 70 year old patient comes with a history of having a ulcer on the left lower extremity for the past 4 weeks. she says she's had swelling of both lower extremities for about a year after she started having prednisone. 02/07/2015 -- her vascular appointments obtained were in the first and third week of June. she is able to go to [place] and we will try and get her some earlier appointments. Other than that nothing else has changed in her management. Electronic Signature(s) Signed: 04/12/2016 11:18:26 AM By: Kristin Fudge MD, [name] Entered By: [name] on 04/12/2016 11:18:26 [name], Kristin PHU ([id number]) -------------------------------------------------------------------------------- HPI Details Patient Name: [name], [name] 04/12/2016 10:45 Date of Service: AM Medical Record [id number] Number: Patient Account Number: [id number] [birth year] (70 y.o. Treating RN: [name] Date of Birth/Sex: Female) Other Clinician: Primary Care Physician: [facility], Delana Meyer Treating Keimon Basaldua Referring Physician: Glendon Axe Physician/Extender: Weeks in Treatment: 66 History of Present Illness HPI Description: 70 year old patient who is known to have diabetes mellitus type 2, chronic renal insufficiency, coronary artery disease, hypertension, hypercholesterolemia, temporal arteritis and inflammatory arthritiss also  has a history of having a hysterectomy and some orthopedic related surgeries. The ulcer on the left lower extremity started off as a blister and then. Got progressively worse. She does not have any fever or chills and has not had any recent surgical intervention for this. Her last hemoglobin A1c was 10.1 in September 2015. She has been recently put on doxycycline by her PCP. She is now also allergic to doxycycline and was this was changed over to Keflex. 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 She does see a cardiologist and also takes a diuretic. 02/07/2015 her arterial and venous duplex studies to be done have dates been given as the first and third week of June. This is at [facility]. We are going to try and get early appointments at [facility]. other than that nothing has changed in her management. 02/14/2015 -- we have been able to get her an appointment in [facility] on May 20 which is much earlier than her previous ones at [place]. She continues with her prednisone and her sugars are in the range of 150-200. 02/21/2015 We were able to get a vascular lab workup for her today and she is going to be there at 2:00 this afternoon. the swelling of her leg has gone down significantly but she still has some tenderness over the wounds. 02/28/2015 - She has had one of two vascular workups done, and this coming Tuesday has another, at [facility]. She continues to be on steroid medications. She has significant sensitivity in her left lower extremity and has pain suggestive of neuropathic pain and I have asked her to address this with her primary care physician. 03/07/2015 -- The patient saw Dr. Lucky Cowboy for a consultation and he has had her arteries are okay but she has 2 incompetent veins on the  left lower extremity and he is going to set her up for surgery.  Official report is awaited. Addendum: Official reports are now available and on 03/04/2015. She was seen and lower extremity venous duplex exam was done. There was reflux present within the left greater saphenous vein below the knee and also the left small saphenous vein. Arterial duplex showed normal triphasic waveforms throughout the left lower extremity without any significant stenosis. Her ABIs were noncompressible bilaterally but a waveforms were normal and a digital pressures were normal bilaterally consistent with no significant arterial insufficiency. He has recommended endovenous ablation of both the left small saphenous and the left great saphenous vein. This would still be scheduled later. 03/14/2015 -- she has heard back from the vascular office and has surgery scheduled for sometime in July. [name], [name] ([id number]) Her rheumatologist has decreased her prednisone dosage but she still on it. She has also had cataract surgery in her right eye recently this week. 03/20/2015 - No new complaints today. Pain improved. No fever or chills. Tolerating 2 layer compression. 04/14/2015 -- she was doing very well today she went off on vacation and now her edema has increased markedly the ulceration is bigger and her diabetes is not under control. 04/21/2015 -- I spoke to her PCP Dr. Candiss Norse and discussed the management which would include being seen by a general surgeon for debridement and taking multiple punch biopsies which would help in establishing the diagnosis of this is a vasculitis. She is agreeable about this and will set her up for the procedure with Dr. Tamala Julian at [facility]. She was seen by the surgeon Dr. Jamal Collin. 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 She is scheduled to have laser ablation done next week.Pt was sent here for possible biopsy to look for  vasculitis. Feel it would be better to wait after laser ablation is completed- the ulcer may heal fully and biopsy may not be necessary 04/29/2015 -- she had the venous ablation done by Dr. Lucky Cowboy last Friday and we do not have any notes yet. She is doing fine otherwise. 05/06/2015 --Review of her recent vascular intervention shows that she was seen by Dr. Lucky Cowboy on 04/29/2015. The follow-up duplex which was done showed that both the great saphenous vein and the small saphenous vein remained patent with reflux consistent with an unsuccessful ablation. He has rescheduled her for another the endovenous ablation to be done in about 4 weekso time. 05/13/2015 -- he was seen by her surgeon Dr. Jamal Collin who asked her to continue with conservative therapy and he would speak to Dr. Lucky Cowboy about her management. Dr. Lucky Cowboy is going to schedule her surgery in the middle of August for a repeat endovenous ablation. Her pus culture from last week has grown : Komatke her noted her sensitivity report but due to her multiple allergies I had tried clindamycin and she developed a rash with this too. She has been prescribed and anti-buttocks in the ER and is has it at home and she will let is know what she is going to be taking. 05/20/2015 -- she has developed a small spot on her right lower extremity but besides that it is not a full fledged ulceration. She did not get to see Dr. Lucky Cowboy last week and hopefully she will see him in the near future. 05/27/2015 -she is still awaiting her  appointment with Dr.Dew and her vascular procedure is not scheduled until August 19. She will be seeing her PCP tomorrow and I have asked her to convey our discussion so that she is aware that debridement has not been done yet. 06/03/2015 -- was seen by her rheumatologist Dr. Dorthula Matas, who has been treating her for temporal arteritis and  in his note has mentioned the possibility of vasculitis or pyoderma gangrenosum. He is lowering her prednisone to 12-1/2 mg for 1 month and then 10 mg per the next month. I will again make an attempt to speak to her PCP Dr. Candiss Norse and her surgeon Dr. 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 06/17/2015 -- Dr.Dew did her vascular procedure last week and a follow-up venous ultrasound shows good resolution of the veins as per the patient's history. He is to see her back in 2 weeks. [name], [name] ([id number]) 07/07/2015. -- the patient has had a heavy growth of Proteus mirabilis and Enterococcus faecalis. These are sensitive to several drugs but the problem is she has allergies to all of these and hence I would like her to see Dr. Ola Spurr for this. She is also due to see Dr. Lucky Cowboy tomorrow and I will discussed the management with him including debridement under anesthesia and possible biopsies. 07/14/2015 -- she has an appointment to see Dr. Ola Spurr tomorrow and did see Dr. Bunnie Domino PA who will discuss my request with him. 07/21/2015 -- saw Dr. Ola Spurr was able to do a test on her and has put her on amoxicillin. She has been tolerating that and has had no problems with allergies to this. 07/28/2015 -- Last Friday I spoke to Dr. Leotis Pain regarding her care and he said that her right leg did not need any surgery and on the left leg was doing pretty good. We did agree that if she undergoes any procedure in the future he would do a couple of punch biopsies of the wound. 08/18/2015 -- her right leg is very tender and there is significant amount of slough. The left leg is looking much better 09/02/2015 -- she is going to have a debridement and punch biopsies of her right lower extremity by Dr. Leotis Pain this coming Thursday. Also seen Dr. Ola Spurr who has continued her on  ciprofloxacin. 09/09/2015 -- on 09/04/2015 Dr. Leotis Pain took her to surgery - Irrigation and excisional debridement of skin, soft tissue, and muscle to about 40 cm2 to the right posterior calf with biopsy. Pathology results are -- DIAGNOSIS: SKIN, RIGHT LOWER EXTREMITY; BIOPSY: SKIN AND SOFT TISSUE WITH ULCERATION, SEE NOTE. - NEGATIVE FOR DYSPLASIA AND MALIGNANCY. Note: There is acute inflammation and ulceration of the epidermis. The dermis shows nonspecific inflammation, neovascularization, and hemosiderin deposition. The differential diagnosis for these findings includes stasis dermatitis, nonspecific dermatitis, and infection. Correlation with clinical findings is required. A PAS fungal stain is obtained and results will be reported in an addendum. cultures were also sent and this grew Pseudomonas aeruginosa, Escherichia coli, Proteus mirabilis, Klebsiella pneumoniae and they were all sensitive to ciprofloxacin which she is on. 09/16/2015 -- he saw Dr. 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 He is going to call the patient in and discuss with her the management possibly with Imuran. I will continue treating  her locally. 09/23/2015 -- she saw Dr. Ola Spurr today who was going to continue the antibiotics for now and stop after this course. She has an appointment to see Dr. Jefm Bryant in about a week's time. Her wound VAC has arrived but she did not bring it with her today. We will try and set her up for changes to the right leg 3 times a week. She is here for her first application of Apligraf to the left lower extremity. 09/30/2015 -- she has seen Dr. 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 because she is ambulatory at home health will not apply a wound VAC and she will have  to come here 3 times a week on Monday Wednesday and Friday. 10/10/2015 -- she is here for a second application of Apligraf to her left lower extremity. 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 Jefm Bryant. Other than that she is doing well 10/31/2015 -- she is here for a third application of Apligraf to her left lower extremity. after reviewing her wound on the right side I noted that it is granulating extremely well and we will use the remnants of the Apligraf on the right leg. [name], [name] ([id number]) 11/14/2015 -- she was recently admitted to the hospital between January 11 and 11/10/2015 for uncontrolled hypertension, diabetes mellitus, acute kidney injury. during her admission she was supported by wound care and also by antibiotics. Around this time her immunosuppression was stopped by Dr. Nunzio Cory. She is feeling much better now. 11/21/2015 -- she has had her fourth application of Apligraf today and it has been shared between the left and the right leg 11/24/2015 -- was here for a wound VAC change but it was noticed that she had profuse bleeding from the lobe medial part of her right lower extremity and I was called in to evaluate her. 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 She has been doing fine since then 12/19/2015 -- she has finished 5 applications of Apligraf to the left lower extremity and this is doing great. He began with pain and swelling of her right ankle a couple of days ago and it has caused her much distress. She does not have any fever or any change in her general health. 12/22/2015 -- the patient's pain and swelling of the right lower extremity has resolved over the weekend. The left lower extremity is completely healed. She has had the first application of Apligraf on the right leg today. 01/02/2016 -- She is  here for the second application of Apligraf on the right leg today. 01/19/2016 -- she is here for the third application of Apligraf to the right posterior lateral leg. 02/02/2016 -- today she has had her fourth application of Apligraf and we have applied some of it to the right posterior lateral leg and the remaining waist we have used for the left anterior medial leg. 02/12/2016 -- today she is use the fifth application of Apligraf to the right posterior lateral leg. The remaining to be wasted has been applied to the left anterior medial leg. 03/01/2016 -- her copayment for the Apligraf is about $300 apiece and she's been to talk to her family to see if she can afford it. 03/15/2016 -- she was able to get some charity care to help with her  copayment and we have applied the first Apligraf to her right anterior medial leg. 03/29/2016 -- she is here for a second application to the right anterior medial leg. 04/05/2016 -- I had asked her to check with Dr. Jefm Bryant regarding her treatment with her immunosuppression for pyoderma gangrenosum and she has an appointment pending tomorrow. 04/12/2016 -- she saw Dr.Kernodle, at the clinic for rheumatology review. He recommended she resume Imuran at 75 and boost prednisone back to 5 mg temporarily. He also put her on Neurontin 100 mg 3 times daily. Electronic Signature(s) Signed: 04/12/2016 11:18:55 AM By: Kristin Fudge MD, [name] Entered By: [name] on 04/12/2016 11:18:55 Seib, MURIELLE MARSICANO ([id number]) -------------------------------------------------------------------------------- Physical Exam Details Patient Name: JINNA, VAYNSHTEYN [name] 04/12/2016 10:45 Date of Service: AM Medical Record [id number] Number: Patient Account Number: [id number] 1946-02-08 (70 y.o. Treating RN: [name] Date of Birth/Sex: Female) Other Clinician: Primary Care Physician: [facility], Delana Meyer Treating [name] Referring Physician: Glendon Axe Physician/Extender: Weeks  in Treatment: 62 Constitutional . Pulse regular. Respirations normal and unlabored. Afebrile. . Eyes Nonicteric. Reactive to light. Ears, Nose, Mouth, and Throat Lips, teeth, and gums WNL.Marland Kitchen Moist mucosa without lesions. Neck supple and nontender. No palpable supraclavicular or cervical adenopathy. Normal sized without goiter. Respiratory WNL. No retractions.. Breath sounds WNL, No rubs, rales, rhonchi, or wheeze.. Cardiovascular Heart rhythm and rate regular, no murmur or gallop.. Pedal Pulses WNL. No clubbing, cyanosis or edema. Lymphatic No adneopathy. No adenopathy. No adenopathy. Musculoskeletal Adexa without tenderness or enlargement.. Digits and nails w/o clubbing, cyanosis, infection, petechiae, ischemia, or inflammatory conditions.. Integumentary (Hair, Skin) No suspicious lesions. No crepitus or fluctuance. No peri-wound warmth or erythema. No masses.Marland Kitchen Psychiatric Judgement and insight Intact.. No evidence of depression, anxiety, or agitation.. Notes the wound on the right anterior medial leg is looking very good and there is minimal hyper granulation tissue and no evidence of surrounding cellulitis. No debridement was required today. Electronic Signature(s) Signed: 04/12/2016 11:19:26 AM By: Kristin Fudge MD, [name] Entered By: [name] on 04/12/2016 11:19:25 Reuter, [name] ([id number]) -------------------------------------------------------------------------------- Physician Orders Details Patient Name: [name], [name] 04/12/2016 10:45 Date of Service: AM Medical Record [id number] Number: Patient Account Number: [id number] Dec 11, 1945 (70 y.o. Treating RN: [name] Date of Birth/Sex: Female) Other Clinician: Primary Care Physician: [facility], Delana Meyer Treating [name] Referring Physician: Glendon Axe Physician/Extender: Suella [place] in Treatment: 31 Verbal / Phone Orders: Yes Clinician: [name] Read Back and Verified: Yes Diagnosis Coding Wound  Cleansing Wound #6 Right,Medial Lower Leg o Clean wound with Normal Saline. Anesthetic Wound #6 Right,Medial Lower Leg o Topical Lidocaine 4% cream applied to wound bed prior to debridement Primary Wound Dressing Wound #6 Right,Medial Lower Leg o Other: - RTD Secondary Dressing Wound #6 Right,Medial Lower Leg o Gauze and Kerlix/Conform Dressing Change Frequency Wound #6 Right,Medial Lower Leg o Change dressing every other day. Follow-up Appointments Wound #6 Right,Medial Lower Leg o Return Appointment in 1 week. Edema Control Wound #6 Right,Medial Lower Leg o Elevate legs to the level of the heart and pump ankles as often as possible o Other: - compression hose Electronic Signature(s) Signed: 04/12/2016 5:05:24 PM By: Kristin Fudge MD, [name] Signed: 04/14/2016 9:48:25 AM By: [name] RN, [name], [name], [name] Pastrana, Jalin [name] ([id number]) Entered By: [name], RN, [name], Kim on 04/12/2016 10:56:58 [name], [name] ([id number]) -------------------------------------------------------------------------------- Problem List Details Patient Name: OAKLEIGH, CORWIN 04/12/2016 10:45 Date of Service: AM Medical Record [id number] Number: Patient Account Number: [id number] 08/11/1946 (70 y.o. Treating RN: [name] Date of Birth/Sex:  Female) Other Clinician: Primary Care Physician: [facility], Delana Meyer Treating [name] Referring Physician: Glendon Axe Physician/Extender: Weeks in Treatment: 60 Active Problems ICD-10 Encounter Code Description Active Date Diagnosis E11.622 Type 2 diabetes mellitus with other skin ulcer 01/31/2015 Yes E66.01 Morbid (severe) obesity due to excess calories 01/31/2015 Yes I89.0 Lymphedema, not elsewhere classified 01/31/2015 Yes L97.212 Non-pressure chronic ulcer of right calf with fat layer 11/24/2015 Yes exposed I87.311 Chronic venous hypertension (idiopathic) with ulcer of 11/24/2015 Yes right lower extremity Inactive Problems Resolved  Problems ICD-10 Code Description Active Date Resolved Date L97.322 Non-pressure chronic ulcer of left ankle with fat layer 01/31/2015 01/31/2015 exposed I83.222 Varicose veins of left lower extremity with both ulcer of 03/07/2015 03/07/2015 calf and inflammation I83.223 03/07/2015 03/07/2015 [place]rose, Darilyn [name] ([id number]) 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 L03.116 Cellulitis of left lower limb 07/07/2015 07/07/2015 Electronic Signature(s) Signed: 04/12/2016 11:18:18 AM By: Kristin Fudge MD, [name] Entered By: [name] on 04/12/2016 11:18:16 Glance, Tia Masker ([id number]) -------------------------------------------------------------------------------- Progress Note Details Patient Name: Kristin Stare [name] 04/12/2016 10:45 Date of Service: AM Medical Record [id number] Number: Patient Account Number: [id number] 13-Aug-1946 (70 y.o. Treating RN: [name] Date of Birth/Sex: Female) Other Clinician: Primary Care Physician: [facility], Delana Meyer Treating [name] Referring Physician: Glendon Axe Physician/Extender: Weeks in Treatment: 40 Subjective Chief Complaint Information obtained from Patient Patient presents to the wound care center for a consult due non healing wound 70 year old patient comes with a history of having a ulcer on the left lower extremity for the past 4 weeks. she says she's had swelling of both lower extremities for about a year after she started having prednisone. 02/07/2015 -- her vascular appointments obtained were in the first and third week of June. she is able to go to [place] and we will try and get her some earlier appointments. Other than that nothing else has changed in her management. History of Present Illness (HPI) 70 year old patient who is known to have diabetes mellitus type 2, chronic renal insufficiency, coronary artery disease, hypertension, hypercholesterolemia, temporal arteritis and inflammatory arthritiss  also has a history of having a hysterectomy and some orthopedic related surgeries. The ulcer on the left lower extremity started off as a blister and then. Got progressively worse. She does not have any fever or chills and has not had any recent surgical intervention for this. Her last hemoglobin A1c was 10.1 in September 2015. She has been recently put on doxycycline by her PCP. She is now also allergic to doxycycline and was this was changed over to Keflex. 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 She does see a cardiologist and also takes a diuretic. 02/07/2015 her arterial and venous duplex studies to be done have dates been given as the first and third week of June. This is at [facility]. We are going to try and get early appointments at [facility]. other than that nothing has changed in her management. 02/14/2015 -- we have been able to get her an appointment in [facility] on May 20 which is much earlier than her previous ones at [place]. She continues with her prednisone and her sugars are in the range of 150-200. 02/21/2015 We were able to get a vascular lab workup for her today and she is going to be there at 2:00 this afternoon. the swelling of her leg has gone down significantly but she  still has some tenderness over the wounds. 02/28/2015 - She has had one of two vascular workups done, and this coming Tuesday has another, at [place] [facility]. She continues to be on steroid medications. She has significant sensitivity in her left lower extremity and has pain suggestive of neuropathic pain and I have asked her to address this with her primary care physician. 03/07/2015 -- The patient saw Dr. Lucky Cowboy for a consultation and he has had her arteries are okay but she has 2 incompetent veins on the left lower extremity and he is going to set her up for surgery.  Official report is [name], [name] ([id number]) awaited. Addendum: Official reports are now available and on 03/04/2015. She was seen and lower extremity venous duplex exam was done. There was reflux present within the left greater saphenous vein below the knee and also the left small saphenous vein. Arterial duplex showed normal triphasic waveforms throughout the left lower extremity without any significant stenosis. Her ABIs were noncompressible bilaterally but a waveforms were normal and a digital pressures were normal bilaterally consistent with no significant arterial insufficiency. He has recommended endovenous ablation of both the left small saphenous and the left great saphenous vein. This would still be scheduled later. 03/14/2015 -- she has heard back from the vascular office and has surgery scheduled for sometime in July. Her rheumatologist has decreased her prednisone dosage but she still on it. She has also had cataract surgery in her right eye recently this week. 03/20/2015 - No new complaints today. Pain improved. No fever or chills. Tolerating 2 layer compression. 04/14/2015 -- she was doing very well today she went off on vacation and now her edema has increased markedly the ulceration is bigger and her diabetes is not under control. 04/21/2015 -- I spoke to her PCP Dr. Candiss Norse and discussed the management which would include being seen by a general surgeon for debridement and taking multiple punch biopsies which would help in establishing the diagnosis of this is a vasculitis. She is agreeable about this and will set her up for the procedure with Dr. Tamala Julian at [facility]. She was seen by the surgeon Dr. Jamal Collin. 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 She is scheduled to have laser ablation done next week.Pt was sent here for possible biopsy to look for  vasculitis. Feel it would be better to wait after laser ablation is completed- the ulcer may heal fully and biopsy may not be necessary 04/29/2015 -- she had the venous ablation done by Dr. Lucky Cowboy last Friday and we do not have any notes yet. She is doing fine otherwise. 05/06/2015 --Review of her recent vascular intervention shows that she was seen by Dr. Lucky Cowboy on 04/29/2015. The follow-up duplex which was done showed that both the great saphenous vein and the small saphenous vein remained patent with reflux consistent with an unsuccessful ablation. He has rescheduled her for another the endovenous ablation to be done in about 4 weeks time. 05/13/2015 -- he was seen by her surgeon Dr. Jamal Collin who asked her to continue with conservative therapy and he would speak to Dr. Lucky Cowboy about her management. Dr. Lucky Cowboy is going to schedule her surgery in the middle of August for a repeat endovenous ablation. Her pus culture from last week has grown : [place] her noted her sensitivity report but due to  her multiple allergies I had tried clindamycin and she developed a rash with this too. She has been prescribed and anti-buttocks in the ER and is has it at home and she will let is know what she is going to be taking. 05/20/2015 -- she has developed a small spot on her right lower extremity but besides that it is not a full fledged ulceration. She did not get to see Dr. Lucky Cowboy last week and hopefully she will see him in the near future. 05/27/2015 -she is still awaiting her appointment with Dr.Dew and her vascular procedure is not scheduled until August 19. She will be seeing her PCP tomorrow and I have asked her to convey our discussion so that she is aware that debridement has not been done yet. [name], [name] ([id number]) 06/03/2015 -- was seen by her rheumatologist Dr. 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 He is lowering her prednisone to 12-1/2 mg for 1 month and then 10 mg per the next month. I will again make an attempt to speak to her PCP Dr. Candiss Norse and her surgeon Dr. 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 06/17/2015 -- Dr.Dew did her vascular procedure last week and a follow-up venous ultrasound shows good resolution of the veins as per the patient's history. He is to see her back in 2 weeks. 07/07/2015. -- the patient has had a heavy growth of Proteus mirabilis and Enterococcus faecalis. These are sensitive to several drugs but the problem is she has allergies to all of these and hence I would like her to see Dr. Ola Spurr for this. She is also due to see Dr. Lucky Cowboy tomorrow and I will discussed the management with him including debridement under anesthesia and possible biopsies. 07/14/2015 -- she has an appointment to see Dr. Ola Spurr tomorrow and did see Dr. Bunnie Domino PA who will discuss my request with him. 07/21/2015 -- saw Dr. Ola Spurr was able to do a test on her and has put her on amoxicillin. She has been tolerating that and has had no problems with allergies to this. 07/28/2015 -- Last Friday I spoke to Dr. Leotis Pain regarding her care and he said that her right leg did not need any surgery and on the left leg was doing pretty good. We did agree that if she undergoes any procedure in the future he would do a couple of punch biopsies of the wound. 08/18/2015 -- her right leg is very tender and there is significant amount of slough. The left leg is looking much better 09/02/2015 -- she is going to have a debridement and punch biopsies of her right lower extremity by Dr. Leotis Pain this coming Thursday. Also seen Dr. Ola Spurr who has continued her on  ciprofloxacin. 09/09/2015 -- on 09/04/2015 Dr. Leotis Pain took her to surgery - Irrigation and excisional debridement of skin, soft tissue, and muscle to about 40 cm2 to the right posterior calf with biopsy. Pathology results are -- DIAGNOSIS: SKIN, RIGHT LOWER EXTREMITY; BIOPSY: SKIN AND SOFT TISSUE WITH ULCERATION, SEE NOTE. - NEGATIVE FOR DYSPLASIA AND MALIGNANCY. Note: There is acute inflammation and ulceration of the epidermis. The dermis shows nonspecific inflammation, neovascularization, and hemosiderin deposition. The differential diagnosis for these findings includes stasis dermatitis, nonspecific dermatitis, and infection. Correlation with clinical findings is required. A  PAS fungal stain is obtained and results will be reported in an addendum. cultures were also sent and this grew Pseudomonas aeruginosa, Escherichia coli, Proteus mirabilis, Klebsiella pneumoniae and they were all sensitive to ciprofloxacin which she is on. 09/16/2015 -- he saw Dr. 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 He is going to call the patient in and discuss with her the management possibly with Imuran. I will continue treating her locally. 09/23/2015 -- she saw Dr. Ola Spurr today who was going to continue the antibiotics for now and stop after this course. She has an appointment to see Dr. Jefm Bryant in about a week's time. Her wound VAC has arrived but she did not bring it with her today. We will try and set her up for changes to the right leg 3 times a week. She is here for her first application of Apligraf to the left lower extremity. [name], [name] ([id number]) 09/30/2015 -- she has seen Dr. 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 because she is ambulatory at home health will not apply a  wound VAC and she will have to come here 3 times a week on Monday Wednesday and Friday. 10/10/2015 -- she is here for a second application of Apligraf to her left lower extremity. 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 Jefm Bryant. Other than that she is doing well 10/31/2015 -- she is here for a third application of Apligraf to her left lower extremity. after reviewing her wound on the right side I noted that it is granulating extremely well and we will use the remnants of the Apligraf on the right leg. 11/14/2015 -- she was recently admitted to the hospital between January 11 and 11/10/2015 for uncontrolled hypertension, diabetes mellitus, acute kidney injury. during her admission she was supported by wound care and also by antibiotics. Around this time her immunosuppression was stopped by Dr. Nunzio Cory. She is feeling much better now. 11/21/2015 -- she has had her fourth application of Apligraf today and it has been shared between the left and the right leg 11/24/2015 -- was here for a wound VAC change but it was noticed that she had profuse bleeding from the lobe medial part of her right lower extremity and I was called in to evaluate her. 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 She has been doing fine since then 12/19/2015 -- she has finished 5 applications of Apligraf to the left lower extremity and this is doing great. He began with pain and swelling of her right ankle a couple of days ago and it has caused her much distress. She does not have any fever or any change in her general health. 12/22/2015 -- the patient's pain and swelling of the right lower extremity has resolved over the weekend. The left lower extremity is completely healed. She has had the first application of Apligraf on the right leg today. 01/02/2016 -- She is  here for the second application of Apligraf on the right leg today. 01/19/2016 -- she is here for the third application of Apligraf to the right posterior lateral leg. 02/02/2016 -- today she has had her fourth application of Apligraf and we have applied some  of it to the right posterior lateral leg and the remaining waist we have used for the left anterior medial leg. 02/12/2016 -- today she is use the fifth application of Apligraf to the right posterior lateral leg. The remaining to be wasted has been applied to the left anterior medial leg. 03/01/2016 -- her copayment for the Apligraf is about $300 apiece and she's been to talk to her family to see if she can afford it. 03/15/2016 -- she was able to get some charity care to help with her copayment and we have applied the first Apligraf to her right anterior medial leg. 03/29/2016 -- she is here for a second application to the right anterior medial leg. 04/05/2016 -- I had asked her to check with Dr. Jefm Bryant regarding her treatment with her immunosuppression for pyoderma gangrenosum and she has an appointment pending tomorrow. 04/12/2016 -- she saw Dr.Kernodle, at the clinic for rheumatology review. He recommended she resume Imuran at 75 and boost prednisone back to 5 mg temporarily. He also put her on Neurontin 100 mg 3 times daily. Objective [name], [name] ([id number]) Constitutional Pulse regular. Respirations normal and unlabored. Afebrile. Vitals Time Taken: 10:42 AM, Height: 65 in, Weight: 248 lbs, BMI: 41.3, Temperature: 97.7 F, Pulse: 66 bpm, Respiratory Rate: 18 breaths/min, Blood Pressure: 158/65 mmHg. Eyes Nonicteric. Reactive to light. Ears, Nose, Mouth, and Throat Lips, teeth, and gums WNL.Marland Kitchen Moist mucosa without lesions. Neck supple and nontender. No palpable supraclavicular or cervical adenopathy. Normal sized without goiter. Respiratory WNL. No retractions.. Breath sounds WNL, No rubs, rales, rhonchi, or  wheeze.. Cardiovascular Heart rhythm and rate regular, no murmur or gallop.. Pedal Pulses WNL. No clubbing, cyanosis or edema. Lymphatic No adneopathy. No adenopathy. No adenopathy. Musculoskeletal Adexa without tenderness or enlargement.. Digits and nails w/o clubbing, cyanosis, infection, petechiae, ischemia, or inflammatory conditions.Marland Kitchen Psychiatric Judgement and insight Intact.. No evidence of depression, anxiety, or agitation.. General Notes: the wound on the right anterior medial leg is looking very good and there is minimal hyper granulation tissue and no evidence of surrounding cellulitis. No debridement was required today. Integumentary (Hair, Skin) No suspicious lesions. No crepitus or fluctuance. No peri-wound warmth or erythema. No masses.. Wound #6 status is Open. Original cause of wound was Gradually Appeared. The wound is located on the Right,Medial Lower Leg. The wound measures 3cm length x 2cm width x 0.1cm depth; 4.712cm^2 area and 0.471cm^3 volume. The wound is limited to skin breakdown. There is no undermining noted. There is a large amount of serous drainage noted. The wound margin is flat and intact. There is large (67-100%) red granulation within the wound bed. There is no necrotic tissue within the wound bed. The periwound skin appearance exhibited: Moist. The periwound skin appearance did not exhibit: Callus, Crepitus, Excoriation, Fluctuance, Friable, Induration, Localized Edema, Rash, Scarring, Dry/Scaly, Maceration, Atrophie Blanche, Cyanosis, Ecchymosis, Hemosiderin Staining, Mottled, Pallor, Rubor, Erythema. Periwound temperature was noted as No Abnormality. The periwound has tenderness on palpation. 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 Anesthetic: Wound #6 Right,Medial Lower Leg: Topical Lidocaine 4% cream applied to wound bed prior to debridement Primary Wound Dressing: Wound #  6 Right,Medial Lower Leg: Other: - RTD Secondary Dressing: Wound #6 Right,Medial Lower Leg: Gauze and Kerlix/Conform Dressing Change Frequency: Wound #6 Right,Medial Lower Leg: Change dressing every other day. Follow-up Appointments: Wound #6 Right,Medial Lower Leg: Return Appointment in 1 week. Edema Control: Wound #6 Right,Medial Lower Leg: Elevate legs to the level of the heart and pump ankles as often as possible Other: - compression hose Morici, Blima [name] ([id number]) Having made a good progress, I am going to await the decision regarding further Apligraf, as she'll be paying out of pocket. I recommended RTD and local compression to be carried out with elevation and exercise. I was also pleased to note that she is back on her immunosuppression which will help combating her other multiple rheumatology problems. we will see her back next week Electronic Signature(s) Signed: 04/12/2016 11:21:00 AM By: Kristin Fudge MD, [name] Entered By: [name] on 04/12/2016 11:21:00 Riemann, Tomasita [name] ([id number]) -------------------------------------------------------------------------------- SuperBill Details Patient Name: Kristin Stare [name] Date of Service: 04/12/2016 Medical Record Number: [id number] Patient Account Number: [id number] Date of Birth/Sex: 05-09-1946 (70 y.o. Female) Treating RN: [name] Primary Care Physician: Glendon Axe Other Clinician: Referring Physician: Glendon Axe Treating Physician/Extender: Frann Rider in Treatment: 62 Diagnosis Coding ICD-10 Codes Code Description E11.622 Type 2 diabetes mellitus  with other skin ulcer E66.01 Morbid (severe) obesity due to excess calories I89.0 Lymphedema, not elsewhere classified L97.212 Non-pressure chronic ulcer of right calf with fat layer exposed I87.311 Chronic venous hypertension (idiopathic) with ulcer of right lower extremity Facility Procedures CPT4 Code: [id number] Description: [id number] - WOUND CARE VISIT-LEV 2 EST PT Modifier: Quantity: 1 Physician Procedures CPT4 Code Description: [id number] - WC PHYS LEVEL 3 - EST PT ICD-10 Description Diagnosis E11.622 Type 2 diabetes mellitus with other skin ulcer I89.0 Lymphedema, not elsewhere classified L97.212 Non-pressure chronic ulcer of right calf with fat la  I87.311 Chronic venous hypertension (idiopathic) with ulcer Modifier: yer exposed of right lower Quantity: 1 extremity Electronic Signature(s) Signed: 04/13/2016 8:18:50 AM By: Kristin Fudge MD, [name] Signed: 04/14/2016 9:48:25 AM By: [name] RN, [name], [name], [name] Previous Signature: 04/12/2016 11:21:15 AM Version By: Kristin Fudge MD, [name] Entered By: [name] RN, [name], Kim on 04/12/2016 17:07:10

## 2016-04-14 NOTE — Progress Notes (Signed)
NALIYAH, FARIS ([id number]) Visit Report for 04/12/2016 Arrival Information Details Patient Name: [name], [name] Date of Service: 04/12/2016 10:45 AM Medical Record Number: [id number] Patient Account Number: [id number] Date of Birth/Sex: 12/09/1945 (70 y.o. Female) Treating RN: [name] Primary Care Physician: Glendon Axe Other Clinician: Referring Physician: Glendon Axe Treating Physician/Extender: Frann Rider in Treatment: 26 Visit Information History Since Last Visit Added or deleted any medications: No Patient Arrived: Ambulatory Any new allergies or adverse reactions: No Arrival Time: 10:41 Had a fall or experienced change in No Accompanied By: self activities of daily living that may affect Transfer Assistance: None risk of falls: Patient Identification Verified: Yes Signs or symptoms of abuse/neglect since last No Secondary Verification Process Yes visito Completed: Hospitalized since last visit: No Patient Requires Transmission- No Has Dressing in Place as Prescribed: Yes Based Precautions: Has Compression in Place as Prescribed: Yes Patient Has Alerts: Yes Pain Present Now: No Patient Alerts: Patient on Blood Thinner Electronic Signature(s) Signed: 04/14/2016 9:48:25 AM By: [name], RN, [name], [name], [name] Entered By: [name], RN, [name], Kim on 04/12/2016 10:41:54 Godley, Tia Masker ([id number]) -------------------------------------------------------------------------------- Clinic Level of Care Assessment Details Patient Name: [name], NETHERCUTT D. Date of Service: 04/12/2016 10:45 AM Medical Record Number: [id number] Patient Account Number: [id number] Date of Birth/Sex: 12-09-1945 (70 y.o. Female) Treating RN: [name] Primary Care Physician: Glendon Axe Other Clinician: Referring Physician: Glendon Axe Treating Physician/Extender: Frann Rider in Treatment: 43 Clinic Level of Care Assessment Items TOOL 4 Quantity Score []  - Use when only an  EandM is performed on FOLLOW-UP visit 0 ASSESSMENTS - Nursing Assessment / Reassessment []  - Reassessment of Co-morbidities (includes updates in patient status) 0 X - Reassessment of Adherence to Treatment Plan 1 5 ASSESSMENTS - Wound and Skin Assessment / Reassessment X - Simple Wound Assessment / Reassessment - one wound 1 5 []  - Complex Wound Assessment / Reassessment - multiple wounds 0 []  - Dermatologic / Skin Assessment (not related to wound area) 0 ASSESSMENTS - Focused Assessment []  - Circumferential Edema Measurements - multi extremities 0 []  - Nutritional Assessment / Counseling / Intervention 0 []  - Lower Extremity Assessment (monofilament, tuning fork, pulses) 0 []  - Peripheral Arterial Disease Assessment (using hand held doppler) 0 ASSESSMENTS - Ostomy and/or Continence Assessment and Care []  - Incontinence Assessment and Management 0 []  - Ostomy Care Assessment and Management (repouching, etc.) 0 PROCESS - Coordination of Care X - Simple Patient / Family Education for ongoing care 1 15 []  - Complex (extensive) Patient / Family Education for ongoing care 0 []  - Staff obtains Programmer, systems, Records, Test Results / Process Orders 0 []  - Staff telephones HHA, Nursing Homes / Clarify orders / etc 0 []  - Routine Transfer to another Facility (non-emergent condition) 0 Mcnutt, Maysoon D. ([id number]) []  - Routine Hospital Admission (non-emergent condition) 0 []  - New Admissions / Biomedical engineer / Ordering NPWT, Apligraf, etc. 0 []  - Emergency Hospital Admission (emergent condition) 0 X - Simple Discharge Coordination 1 10 []  - Complex (extensive) Discharge Coordination 0 PROCESS - Special Needs []  - Pediatric / Minor Patient Management 0 []  - Isolation Patient Management 0 []  - Hearing / Language / Visual special needs 0 []  - Assessment of Community assistance (transportation, D/C planning, etc.) 0 []  - Additional assistance / Altered mentation 0 []  - Support Surface(s)  Assessment (bed, cushion, seat, etc.) 0 INTERVENTIONS - Wound Cleansing / Measurement X - Simple Wound Cleansing - one wound 1 5 []  - Complex Wound Cleansing -  multiple wounds 0 X - Wound Imaging (photographs - any number of wounds) 1 5 []  - Wound Tracing (instead of photographs) 0 X - Simple Wound Measurement - one wound 1 5 []  - Complex Wound Measurement - multiple wounds 0 INTERVENTIONS - Wound Dressings X - Small Wound Dressing one or multiple wounds 1 10 []  - Medium Wound Dressing one or multiple wounds 0 []  - Large Wound Dressing one or multiple wounds 0 []  - Application of Medications - topical 0 []  - Application of Medications - injection 0 INTERVENTIONS - Miscellaneous []  - External ear exam 0 Grape, Caniyah D. ([id number]) []  - Specimen Collection (cultures, biopsies, blood, body fluids, etc.) 0 []  - Specimen(s) / Culture(s) sent or taken to Lab for analysis 0 []  - Patient Transfer (multiple staff / [name]) 0 []  - Simple Staple / Suture removal (25 or less) 0 []  - Complex Staple / Suture removal (26 or more) 0 []  - Hypo / Hyperglycemic Management (close monitor of Blood Glucose) 0 []  - Ankle / Brachial Index (ABI) - do not check if billed separately 0 X - Vital Signs 1 5 Has the patient been seen at the hospital within the last three years: Yes Total Score: 65 Level Of Care: New/Established - Level 2 Electronic Signature(s) Signed: 04/14/2016 9:48:25 AM By: [name], RN, [name], [name], [name] Entered By: [name], RN, [name], Kim on 04/12/2016 17:07:01 Mccoll, Khloe DMarland Kitchen ([id number]) -------------------------------------------------------------------------------- Encounter Discharge Information Details Patient Name: RICOLE, DEASON D. Date of Service: 04/12/2016 10:45 AM Medical Record Number: [id number] Patient Account Number: [id number] Date of Birth/Sex: 05-07-1946 (70 y.o. Female) Treating RN: [name] Primary Care Physician: Glendon Axe Other  Clinician: Referring Physician: Glendon Axe Treating Physician/Extender: Frann Rider in Treatment: 37 Encounter Discharge Information Items Discharge Pain Level: 0 Discharge Condition: Stable Ambulatory Status: Ambulatory Discharge Destination: Home Transportation: Private Auto Accompanied By: self Schedule Follow-up Appointment: Yes Medication Reconciliation completed and provided to Patient/Care Yes Kennedie Pardoe: Provided on Clinical Summary of Care: 04/12/2016 Form Type Recipient Paper Patient LM Electronic Signature(s) Signed: 04/14/2016 9:48:25 AM By: [name] RN, [name], [name], [name] Previous Signature: 04/12/2016 11:02:44 AM Version By: Ruthine Dose Entered By: [name] RN, [name], Kim on 04/12/2016 17:11:09 Bergh, Mahek D. ([id number]) -------------------------------------------------------------------------------- Lower Extremity Assessment Details Patient Name: MARLESHA, SNIFFEN D. Date of Service: 04/12/2016 10:45 AM Medical Record Number: [id number] Patient Account Number: [id number] Date of Birth/Sex: 06-05-1946 (70 y.o. Female) Treating RN: [name] Primary Care Physician: Glendon Axe Other Clinician: Referring Physician: Glendon Axe Treating Physician/Extender: Frann Rider in Treatment: 62 Vascular Assessment Pulses: Posterior Tibial Dorsalis Pedis Palpable: [Right:Yes] Extremity colors, hair growth, and conditions: Extremity Color: [Right:Hyperpigmented] Hair Growth on Extremity: [Right:No] Temperature of Extremity: [Right:Warm] Capillary Refill: [Right:< 3 seconds] Toe Nail Assessment Left: Right: Thick: No Discolored: No Deformed: No Improper Length and Hygiene: No Electronic Signature(s) Signed: 04/14/2016 9:48:25 AM By: [name], RN, [name], [name], [name] Entered By: [name], RN, [name], Kim on 04/12/2016 10:46:53 Diffley, Sherral D. ([id number]) -------------------------------------------------------------------------------- Multi Wound Chart  Details Patient Name: [name] Date of Service: 04/12/2016 10:45 AM Medical Record Number: [id number] Patient Account Number: [id number] Date of Birth/Sex: April 16, 1946 (70 y.o. Female) Treating RN: [name] Primary Care Physician: Glendon Axe Other Clinician: Referring Physician: Glendon Axe Treating Physician/Extender: Frann Rider in Treatment: 62 Vital Signs Height(in): 65 Pulse(bpm): 66 Weight(lbs): 248 Blood Pressure 158/65 (mmHg): Body Mass Index(BMI): 41 Temperature(F): 97.7 Respiratory Rate 18 (breaths/min): Photos: [N/A:N/A] Wound Location: Right Lower Leg -  Medial N/A N/A Wounding Event: Gradually Appeared N/A N/A Primary Etiology: Diabetic Wound/Ulcer of N/A N/A the Lower Extremity Comorbid History: Cataracts, Asthma, N/A N/A Coronary Artery Disease, Hypertension, Type II Diabetes, Osteoarthritis, Neuropathy Date Acquired: 10/10/2015 N/A N/A Weeks of Treatment: 26 N/A N/A Wound Status: Open N/A N/A Measurements L x W x D 3x2x0.1 N/A N/A (cm) Area (cm) : 4.712 N/A N/A Volume (cm) : 0.471 N/A N/A % Reduction in Area: -36.30% N/A N/A % Reduction in Volume: -36.10% N/A N/A Classification: Grade 1 N/A N/A Exudate Amount: Large N/A N/A Exudate Type: Serous N/A N/A Exudate Color: amber N/A N/A Wound Margin: Flat and Intact N/A N/A Cuoco, Jailine D. ([id number]) Granulation Amount: Large (67-100%) N/A N/A Granulation Quality: Red, Hyper-granulation N/A N/A Necrotic Amount: None Present (0%) N/A N/A Exposed Structures: Fascia: No N/A N/A Fat: No Tendon: No Muscle: No Joint: No Bone: No Limited to Skin Breakdown Epithelialization: None N/A N/A Periwound Skin Texture: Edema: No N/A N/A Excoriation: No Induration: No Callus: No Crepitus: No Fluctuance: No Friable: No Rash: No Scarring: No Periwound Skin Moist: Yes N/A N/A Moisture: Maceration: No Dry/Scaly: No Periwound Skin Color: Atrophie Blanche: No N/A N/A Cyanosis:  No Ecchymosis: No Erythema: No Hemosiderin Staining: No Mottled: No Pallor: No Rubor: No Temperature: No Abnormality N/A N/A Tenderness on Yes N/A N/A Palpation: Wound Preparation: Ulcer Cleansing: N/A N/A Rinsed/Irrigated with Saline Topical Anesthetic Applied: Other: lidocaine 4% Treatment Notes Electronic Signature(s) Signed: 04/14/2016 9:48:25 AM By: [name], RN, [name], [name], [name] Entered By: [name], RN, [name], Kim on 04/12/2016 10:53:35 Mirarchi, NYLANI ROCHELL ([id number]) AILYNN, RENNINGER ([id number]) -------------------------------------------------------------------------------- Multi-Disciplinary Care Plan Details Patient Name: KYASHA, WISSEL. Date of Service: 04/12/2016 10:45 AM Medical Record Number: [id number] Patient Account Number: [id number] Date of Birth/Sex: Mar 06, 1946 (70 y.o. Female) Treating RN: [name] Primary Care Physician: Glendon Axe Other Clinician: Referring Physician: Glendon Axe Treating Physician/Extender: Frann Rider in Treatment: 53 Active Inactive Orientation to the Wound Care Program Nursing Diagnoses: Knowledge deficit related to the wound healing center program Goals: Patient/caregiver will verbalize understanding of the [facility] Date Initiated: 01/31/2015 Goal Status: Active Interventions: Provide education on orientation to the wound center Notes: Venous Leg Ulcer Nursing Diagnoses: Potential for venous Insuffiency (use before diagnosis confirmed) Goals: Non-invasive venous studies are completed as ordered Date Initiated: 01/31/2015 Goal Status: Active Patient/caregiver will verbalize understanding of disease process and disease management Date Initiated: 01/31/2015 Goal Status: Active Interventions: Assess peripheral edema status every visit. Notes: Wound/Skin Impairment Nursing Diagnoses: Impaired tissue integrity Knowledge deficit related to smoking impact on wound healing Windle, Phoua D.  ([id number]) Goals: Patient/caregiver will verbalize understanding of skin care regimen Date Initiated: 01/31/2015 Goal Status: Active Ulcer/skin breakdown will heal within 14 weeks Date Initiated: 01/31/2015 Goal Status: Active Interventions: Assess ulceration(s) every visit Notes: Electronic Signature(s) Signed: 04/14/2016 9:48:25 AM By: [name], RN, [name], [name], [name] Entered By: [name], RN, [name], Kim on 04/12/2016 10:53:27 Escorcia, Keilyn D. ([id number]) -------------------------------------------------------------------------------- Pain Assessment Details Patient Name: JOEL, YORK D. Date of Service: 04/12/2016 10:45 AM Medical Record Number: [id number] Patient Account Number: [id number] Date of Birth/Sex: [birth year] (70 y.o. Female) Treating RN: [name] Primary Care Physician: Glendon Axe Other Clinician: Referring Physician: Glendon Axe Treating Physician/Extender: Frann Rider in Treatment: 68 Active Problems Location of Pain Severity and Description of Pain Patient Has Paino No Site Locations With Dressing Change: No Pain Management and Medication Current Pain Management: Electronic Signature(s) Signed: 04/14/2016 9:48:25 AM By: [name], RN, [name], [name],  [name] Entered By: [name], RN, [name], Kim on 04/12/2016 10:42:03 Sazama, Tia Masker ([id number]) -------------------------------------------------------------------------------- Patient/Caregiver Education Details Patient Name: HEER, MATHUS D. Date of Service: 04/12/2016 10:45 AM Medical Record Number: [id number] Patient Account Number: [id number] Date of Birth/Gender: 05/02/1946 (70 y.o. Female) Treating RN: [name] Primary Care Physician: Glendon Axe Other Clinician: Referring Physician: Glendon Axe Treating Physician/Extender: Frann Rider in Treatment: 41 Education Assessment Education Provided To: Patient Education Topics Provided Wound/Skin Impairment: Handouts: Caring for Your Ulcer, Other:  continue wound care as prescribed Methods: Demonstration Responses: State content correctly Electronic Signature(s) Signed: 04/14/2016 9:48:25 AM By: [name], RN, [name], [name], [name] Entered By: [name], RN, [name], Kim on 04/12/2016 17:11:46 Simmonds, Jayleene DMarland Kitchen ([id number]) -------------------------------------------------------------------------------- Wound Assessment Details Patient Name: MAYBETH, HOBBY D. Date of Service: 04/12/2016 10:45 AM Medical Record Number: [id number] Patient Account Number: [id number] Date of Birth/Sex: [birth year] (70 y.o. Female) Treating RN: [name] Primary Care Physician: Glendon Axe Other Clinician: Referring Physician: Glendon Axe Treating Physician/Extender: Frann Rider in Treatment: 62 Wound Status Wound Number: 6 Primary Diabetic Wound/Ulcer of the Lower Etiology: Extremity Wound Location: Right Lower Leg - Medial Wound Open Wounding Event: Gradually Appeared Status: Date Acquired: 10/10/2015 Comorbid Cataracts, Asthma, Coronary Artery Weeks Of Treatment: 26 History: Disease, Hypertension, Type II Clustered Wound: No Diabetes, Osteoarthritis, Neuropathy Photos Wound Measurements Length: (cm) 3 % Reduction in Width: (cm) 2 % Reduction in Depth: (cm) 0.1 Epithelializati Area: (cm) 4.712 Undermining: Volume: (cm) 0.471 Area: -36.3% Volume: -36.1% on: None No Wound Description Classification: Grade 1 Foul Odor After Wound Margin: Flat and Intact Exudate Amount: Large Exudate Type: Serous Exudate Color: amber Cleansing: No Wound Bed Granulation Amount: Large (67-100%) Exposed Structure Granulation Quality: Red, Hyper-granulation Fascia Exposed: No Necrotic Amount: None Present (0%) Fat Layer Exposed: No Tendon Exposed: No Muscle Exposed: No Simerly, Aleyda D. ([id number]) Joint Exposed: No Bone Exposed: No Limited to Skin Breakdown Periwound Skin Texture Texture Color No Abnormalities Noted: No No Abnormalities Noted:  No Callus: No Atrophie Blanche: No Crepitus: No Cyanosis: No Excoriation: No Ecchymosis: No Fluctuance: No Erythema: No Friable: No Hemosiderin Staining: No Induration: No Mottled: No Localized Edema: No Pallor: No Rash: No Rubor: No Scarring: No Temperature / Pain Moisture Temperature: No Abnormality No Abnormalities Noted: No Tenderness on Palpation: Yes Dry / Scaly: No Maceration: No Moist: Yes Wound Preparation Ulcer Cleansing: Rinsed/Irrigated with Saline Topical Anesthetic Applied: Other: lidocaine 4%, Treatment Notes Wound #6 (Right, Medial Lower Leg) 1. Cleansed with: Clean wound with Normal Saline 2. Anesthetic Topical Lidocaine 4% cream to wound bed prior to debridement 4. Dressing Applied: Other dressing (specify in notes) 5. Secondary Dressing Applied Gauze and Kerlix/Conform 7. Secured with Tubular bandage Notes RTD, gauze and conform secured with stretch net Electronic Signature(s) Signed: 04/14/2016 9:48:25 AM By: [name], RN, [name], [name], [name] Entered By: [name], RN, [name], Kim on 04/12/2016 10:51:30 Cristo, Tia Masker ([id number]) -------------------------------------------------------------------------------- Vitals Details Patient Name: [name] Date of Service: 04/12/2016 10:45 AM Medical Record Number: [id number] Patient Account Number: [id number] Date of Birth/Sex: [birth year] (70 y.o. Female) Treating RN: [name] Primary Care Physician: Glendon Axe Other Clinician: Referring Physician: Glendon Axe Treating Physician/Extender: Frann Rider in Treatment: 38 Vital Signs Time Taken: 10:42 Temperature (F): 97.7 Height (in): 65 Pulse (bpm): 66 Weight (lbs): 248 Respiratory Rate (breaths/min): 18 Body Mass Index (BMI): 41.3 Blood Pressure (mmHg): 158/65 Reference Range: 80 - 120 mg / dl Electronic Signature(s) Signed: 04/14/2016 9:48:25 AM By: [name], RN, [name], [name], [name] Entered  ByGretta Cool, RN, [name], Kim on 04/12/2016  [id number]

## 2016-04-19 ENCOUNTER — Encounter: Payer: Commercial Managed Care - HMO | Admitting: Surgery

## 2016-04-19 DIAGNOSIS — E11622 Type 2 diabetes mellitus with other skin ulcer: Secondary | ICD-10-CM | POA: Diagnosis not present

## 2016-04-19 NOTE — Progress Notes (Addendum)
SOPHEY, STREHLE ([id number]) Visit Report for 04/19/2016 Arrival Information Details Patient Name: [name], [name] Date of Service: 04/19/2016 10:45 AM Medical Record Number: [id number] Patient Account Number: [id number] Date of Birth/Sex: 19-Sep-1946 (70 y.o. Female) Treating RN: [name] Primary Care Physician: Glendon Axe Other Clinician: Referring Physician: Glendon Axe Treating Physician/Extender: Frann Rider in Treatment: 62 Visit Information History Since Last Visit Added or deleted any medications: No Patient Arrived: Ambulatory Any new allergies or adverse reactions: No Arrival Time: 10:54 Had a fall or experienced change in No Accompanied By: self activities of daily living that may affect Transfer Assistance: None risk of falls: Patient Identification Verified: Yes Signs or symptoms of abuse/neglect since last No Secondary Verification Process Yes visito Completed: Hospitalized since last visit: No Patient Requires Transmission- No Has Dressing in Place as Prescribed: Yes Based Precautions: Has Compression in Place as Prescribed: Yes Patient Has Alerts: Yes Pain Present Now: No Patient Alerts: Patient on Blood Thinner Electronic Signature(s) Signed: 04/19/2016 5:08:33 PM By: [name], RN, [name], [name], [name] Entered By: [name], RN, [name], Kim on 04/19/2016 10:55:05 Hemann, Tia Masker ([id number]) -------------------------------------------------------------------------------- Clinic Level of Care Assessment Details Patient Name: [name], [name] Date of Service: 04/19/2016 10:45 AM Medical Record Number: [id number] Patient Account Number: [id number] Date of Birth/Sex: [birth year] (70 y.o. Female) Treating RN: [name] Primary Care Physician: Glendon Axe Other Clinician: Referring Physician: Glendon Axe Treating Physician/Extender: Frann Rider in Treatment: 65 Clinic Level of Care Assessment Items TOOL 4 Quantity Score []  - Use when only an  EandM is performed on FOLLOW-UP visit 0 ASSESSMENTS - Nursing Assessment / Reassessment []  - Reassessment of Co-morbidities (includes updates in patient status) 0 X - Reassessment of Adherence to Treatment Plan 1 5 ASSESSMENTS - Wound and Skin Assessment / Reassessment X - Simple Wound Assessment / Reassessment - one wound 1 5 []  - Complex Wound Assessment / Reassessment - multiple wounds 0 []  - Dermatologic / Skin Assessment (not related to wound area) 0 ASSESSMENTS - Focused Assessment []  - Circumferential Edema Measurements - multi extremities 0 []  - Nutritional Assessment / Counseling / Intervention 0 []  - Lower Extremity Assessment (monofilament, tuning fork, pulses) 0 []  - Peripheral Arterial Disease Assessment (using hand held doppler) 0 ASSESSMENTS - Ostomy and/or Continence Assessment and Care []  - Incontinence Assessment and Management 0 []  - Ostomy Care Assessment and Management (repouching, etc.) 0 PROCESS - Coordination of Care X - Simple Patient / Family Education for ongoing care 1 15 []  - Complex (extensive) Patient / Family Education for ongoing care 0 X - Staff obtains Programmer, systems, Records, Test Results / Process Orders 1 10 []  - Staff telephones HHA, Nursing Homes / Clarify orders / etc 0 []  - Routine Transfer to another Facility (non-emergent condition) 0 Blumberg, Tymika D. ([id number]) []  - Routine Hospital Admission (non-emergent condition) 0 []  - New Admissions / Biomedical engineer / Ordering NPWT, Apligraf, etc. 0 []  - Emergency Hospital Admission (emergent condition) 0 X - Simple Discharge Coordination 1 10 []  - Complex (extensive) Discharge Coordination 0 PROCESS - Special Needs []  - Pediatric / Minor Patient Management 0 []  - Isolation Patient Management 0 []  - Hearing / Language / Visual special needs 0 []  - Assessment of Community assistance (transportation, D/C planning, etc.) 0 []  - Additional assistance / Altered mentation 0 []  - Support Surface(s)  Assessment (bed, cushion, seat, etc.) 0 INTERVENTIONS - Wound Cleansing / Measurement X - Simple Wound Cleansing - one wound 1 5 []  - Complex Wound  Cleansing - multiple wounds 0 X - Wound Imaging (photographs - any number of wounds) 1 5 []  - Wound Tracing (instead of photographs) 0 X - Simple Wound Measurement - one wound 1 5 []  - Complex Wound Measurement - multiple wounds 0 INTERVENTIONS - Wound Dressings X - Small Wound Dressing one or multiple wounds 1 10 []  - Medium Wound Dressing one or multiple wounds 0 []  - Large Wound Dressing one or multiple wounds 0 []  - Application of Medications - topical 0 []  - Application of Medications - injection 0 INTERVENTIONS - Miscellaneous []  - External ear exam 0 Kanan, Maleni D. ([id number]) []  - Specimen Collection (cultures, biopsies, blood, body fluids, etc.) 0 []  - Specimen(s) / Culture(s) sent or taken to Lab for analysis 0 []  - Patient Transfer (multiple staff / [name]) 0 []  - Simple Staple / Suture removal (25 or less) 0 []  - Complex Staple / Suture removal (26 or more) 0 []  - Hypo / Hyperglycemic Management (close monitor of Blood Glucose) 0 []  - Ankle / Brachial Index (ABI) - do not check if billed separately 0 X - Vital Signs 1 5 Has the patient been seen at the hospital within the last three years: Yes Total Score: 75 Level Of Care: New/Established - Level 2 Electronic Signature(s) Signed: 04/19/2016 5:08:33 PM By: [name], RN, [name], [name], [name] Entered By: [name], RN, [name], Kim on 04/19/2016 11:16:07 [place], [place] DMarland Kitchen ([id number]) -------------------------------------------------------------------------------- Encounter Discharge Information Details Patient Name: [name], [name] Date of Service: 04/19/2016 10:45 AM Medical Record Number: [id number] Patient Account Number: [id number] Date of Birth/Sex: 1946-06-07 (70 y.o. Female) Treating RN: [name] Primary Care Physician: Glendon Axe Other  Clinician: Referring Physician: Glendon Axe Treating Physician/Extender: Frann Rider in Treatment: 43 Encounter Discharge Information Items Discharge Pain Level: 0 Discharge Condition: Stable Ambulatory Status: Ambulatory Discharge Destination: Home Transportation: Private Auto Accompanied By: self Schedule Follow-up Appointment: Yes Medication Reconciliation completed and provided to Patient/Care Yes Sahory Nordling: Provided on Clinical Summary of Care: 04/19/2016 Form Type Recipient Paper Patient LM Electronic Signature(s) Signed: 04/19/2016 11:24:55 AM By: Ruthine Dose Entered By: Ruthine Dose on 04/19/2016 11:24:54 Zingale, Yesenia D. ([id number]) -------------------------------------------------------------------------------- Lower Extremity Assessment Details Patient Name: [name] Date of Service: 04/19/2016 10:45 AM Medical Record Number: [id number] Patient Account Number: [id number] Date of Birth/Sex: Aug 13, 1946 (70 y.o. Female) Treating RN: [name] Primary Care Physician: Glendon Axe Other Clinician: Referring Physician: Glendon Axe Treating Physician/Extender: Frann Rider in Treatment: 63 Vascular Assessment Pulses: Posterior Tibial Dorsalis Pedis Palpable: [Right:Yes] Extremity colors, hair growth, and conditions: Extremity Color: [Right:Dusky] Hair Growth on Extremity: [Right:Yes] Temperature of Extremity: [Right:Warm] Capillary Refill: [Right:< 3 seconds] Toe Nail Assessment Left: Right: Thick: No Discolored: No Deformed: No Improper Length and Hygiene: No Electronic Signature(s) Signed: 04/19/2016 5:08:33 PM By: [name], RN, [name], [name], [name] Entered By: [name], RN, [name], Kim on 04/19/2016 11:02:11 Dawes, Aizah D. ([id number]) -------------------------------------------------------------------------------- Multi Wound Chart Details Patient Name: [name] Date of Service: 04/19/2016 10:45 AM Medical Record Number:  [id number] Patient Account Number: [id number] Date of Birth/Sex: [birth year] (69 y.o. Female) Treating RN: [name] Primary Care Physician: Glendon Axe Other Clinician: Referring Physician: Glendon Axe Treating Physician/Extender: Frann Rider in Treatment: 52 Vital Signs Height(in): 65 Pulse(bpm): 62 Weight(lbs): 248 Blood Pressure 156/63 (mmHg): Body Mass Index(BMI): 41 Temperature(F): 97.6 Respiratory Rate 18 (breaths/min): Photos: [N/A:N/A] Wound Location: Right Lower Leg - Medial N/A N/A Wounding Event: Gradually Appeared N/A N/A Primary Etiology: Diabetic Wound/Ulcer  of N/A N/A the Lower Extremity Comorbid History: Cataracts, Asthma, N/A N/A Coronary Artery Disease, Hypertension, Type II Diabetes, Osteoarthritis, Neuropathy Date Acquired: 10/10/2015 N/A N/A Weeks of Treatment: 27 N/A N/A Wound Status: Open N/A N/A Measurements L x W x D 1x0.9x0.1 N/A N/A (cm) Area (cm) : 0.707 N/A N/A Volume (cm) : 0.071 N/A N/A % Reduction in Area: 79.50% N/A N/A % Reduction in Volume: 79.50% N/A N/A Classification: Grade 1 N/A N/A Exudate Amount: Large N/A N/A Exudate Type: Serous N/A N/A Exudate Color: amber N/A N/A Wound Margin: Flat and Intact N/A N/A Barth, Cason D. ([id number]) Granulation Amount: Large (67-100%) N/A N/A Granulation Quality: Red, Hyper-granulation N/A N/A Necrotic Amount: None Present (0%) N/A N/A Exposed Structures: Fascia: No N/A N/A Fat: No Tendon: No Muscle: No Joint: No Bone: No Limited to Skin Breakdown Epithelialization: Large (67-100%) N/A N/A Periwound Skin Texture: Edema: No N/A N/A Excoriation: No Induration: No Callus: No Crepitus: No Fluctuance: No Friable: No Rash: No Scarring: No Periwound Skin Moist: Yes N/A N/A Moisture: Maceration: No Dry/Scaly: No Periwound Skin Color: Atrophie Blanche: No N/A N/A Cyanosis: No Ecchymosis: No Erythema: No Hemosiderin Staining: No Mottled: No Pallor: No Rubor:  No Temperature: No Abnormality N/A N/A Tenderness on Yes N/A N/A Palpation: Wound Preparation: Ulcer Cleansing: N/A N/A Rinsed/Irrigated with Saline Topical Anesthetic Applied: None, Other: lidocaine 4% Treatment Notes Electronic Signature(s) Signed: 04/19/2016 5:08:33 PM By: [name], RN, [name], [name], [name] Entered By: [name], RN, [name], Kim on 04/19/2016 11:08:30 Shimer, ROSABELLE JAGERS ([id number]) [place], [place] Kitchen ([id number]) -------------------------------------------------------------------------------- Multi-Disciplinary Care Plan Details Patient Name: MARYBELLA, CRAN D. Date of Service: 04/19/2016 10:45 AM Medical Record Number: [id number] Patient Account Number: [id number] Date of Birth/Sex: 1946-01-24 (71 y.o. Female) Treating RN: [name] Primary Care Physician: Glendon Axe Other Clinician: Referring Physician: Glendon Axe Treating Physician/Extender: Frann Rider in Treatment: 46 Active Inactive Orientation to the Wound Care Program Nursing Diagnoses: Knowledge deficit related to the wound healing center program Goals: Patient/caregiver will verbalize understanding of the [facility] Program Date Initiated: 01/31/2015 Goal Status: Active Interventions: Provide education on orientation to the wound center Notes: Venous Leg Ulcer Nursing Diagnoses: Potential for venous Insuffiency (use before diagnosis confirmed) Goals: Non-invasive venous studies are completed as ordered Date Initiated: 01/31/2015 Goal Status: Active Patient/caregiver will verbalize understanding of disease process and disease management Date Initiated: 01/31/2015 Goal Status: Active Interventions: Assess peripheral edema status every visit. Notes: Wound/Skin Impairment Nursing Diagnoses: Impaired tissue integrity Knowledge deficit related to smoking impact on wound healing Lortie, Tekeyah D. ([id number]) Goals: Patient/caregiver will verbalize understanding of skin care regimen Date  Initiated: 01/31/2015 Goal Status: Active Ulcer/skin breakdown will heal within 14 weeks Date Initiated: 01/31/2015 Goal Status: Active Interventions: Assess ulceration(s) every visit Notes: Electronic Signature(s) Signed: 04/19/2016 5:08:33 PM By: [name], RN, [name], [name], [name] Entered By: [name], RN, [name], Kim on 04/19/2016 11:07:43 Feldt, Siearra D. ([id number]) -------------------------------------------------------------------------------- Pain Assessment Details Patient Name: CILICIA, CARNAL D. Date of Service: 04/19/2016 10:45 AM Medical Record Number: [id number] Patient Account Number: [id number] Date of Birth/Sex: [birth year] (70 y.o. Female) Treating RN: [name] Primary Care Physician: Glendon Axe Other Clinician: Referring Physician: Glendon Axe Treating Physician/Extender: Frann Rider in Treatment: 63 Active Problems Location of Pain Severity and Description of Pain Patient Has Paino No Site Locations With Dressing Change: No Pain Management and Medication Current Pain Management: Electronic Signature(s) Signed: 04/19/2016 5:08:33 PM By: [name], RN, [name], [name], [name] Entered By: [name], RN, [name], Kim on 04/19/2016 10:55:16 [place],  DANEIL LIGHT ([id number]) -------------------------------------------------------------------------------- Patient/Caregiver Education Details Patient Name: DESTANIE, LUPU D. Date of Service: 04/19/2016 10:45 AM Medical Record Number: [id number] Patient Account Number: [id number] Date of Birth/Gender: 12-05-1945 (70 y.o. Female) Treating RN: [name] Primary Care Physician: Glendon Axe Other Clinician: Referring Physician: Glendon Axe Treating Physician/Extender: Frann Rider in Treatment: 5 Education Assessment Education Provided To: Patient Education Topics Provided Welcome To [facility]: Wound/Skin Impairment: Handouts: Caring for Your Ulcer, Other: wound care Methods: Demonstration Responses: State content  correctly Electronic Signature(s) Signed: 04/19/2016 5:08:33 PM By: [name], RN, [name], [name], [name] Entered By: [name], RN, [name], Kim on 04/19/2016 11:18:05 [place], Lucila DMarland Kitchen ([id number]) -------------------------------------------------------------------------------- Wound Assessment Details Patient Name: CARMALITA, MICHELETTI D. Date of Service: 04/19/2016 10:45 AM Medical Record Number: [id number] Patient Account Number: [id number] Date of Birth/Sex: 08/15/1946 (70 y.o. Female) Treating RN: [name] Primary Care Physician: Glendon Axe Other Clinician: Referring Physician: Glendon Axe Treating Physician/Extender: Frann Rider in Treatment: 63 Wound Status Wound Number: 6 Primary Diabetic Wound/Ulcer of the Lower Etiology: Extremity Wound Location: Right Lower Leg - Medial Wound Open Wounding Event: Gradually Appeared Status: Date Acquired: 10/10/2015 Comorbid Cataracts, Asthma, Coronary Artery Weeks Of Treatment: 27 History: Disease, Hypertension, Type II Clustered Wound: No Diabetes, Osteoarthritis, Neuropathy Photos Wound Measurements Length: (cm) 1 % Reduction in Width: (cm) 0.9 % Reduction in Depth: (cm) 0.1 Epithelializati Area: (cm) 0.707 Tunneling: Volume: (cm) 0.071 Undermining: Area: 79.5% Volume: 79.5% on: Large (67-100%) No No Wound Description Classification: Grade 1 Foul Odor After Wound Margin: Flat and Intact Exudate Amount: Large Exudate Type: Serous Exudate Color: amber Cleansing: No Wound Bed Granulation Amount: Large (67-100%) Exposed Structure Granulation Quality: Red, Hyper-granulation Fascia Exposed: No Necrotic Amount: None Present (0%) Fat Layer Exposed: No Tendon Exposed: No Muscle Exposed: No Dirk, Tylin D. ([id number]) Joint Exposed: No Bone Exposed: No Limited to Skin Breakdown Periwound Skin Texture Texture Color No Abnormalities Noted: No No Abnormalities Noted: No Callus: No Atrophie Blanche: No Crepitus:  No Cyanosis: No Excoriation: No Ecchymosis: No Fluctuance: No Erythema: No Friable: No Hemosiderin Staining: No Induration: No Mottled: No Localized Edema: No Pallor: No Rash: No Rubor: No Scarring: No Temperature / Pain Moisture Temperature: No Abnormality No Abnormalities Noted: No Tenderness on Palpation: Yes Dry / Scaly: No Maceration: No Moist: Yes Wound Preparation Ulcer Cleansing: Rinsed/Irrigated with Saline Topical Anesthetic Applied: None, Other: lidocaine 4%, Electronic Signature(s) Signed: 04/19/2016 5:08:33 PM By: [name], RN, [name], [name], [name] Entered By: [name], RN, [name], Kim on 04/19/2016 11:04:13 [place], Quandra D. ([id number]) -------------------------------------------------------------------------------- Vitals Details Patient Name: [name] Date of Service: 04/19/2016 10:45 AM Medical Record Number: [id number] Patient Account Number: [id number] Date of Birth/Sex: 18-Jun-1946 (70 y.o. Female) Treating RN: [name] Primary Care Physician: Glendon Axe Other Clinician: Referring Physician: Glendon Axe Treating Physician/Extender: Frann Rider in Treatment: 65 Vital Signs Time Taken: 10:56 Temperature (F): 97.6 Height (in): 65 Pulse (bpm): 62 Weight (lbs): 248 Respiratory Rate (breaths/min): 18 Body Mass Index (BMI): 41.3 Blood Pressure (mmHg): 156/63 Reference Range: 80 - 120 mg / dl Electronic Signature(s) Signed: 04/19/2016 5:08:33 PM By: [name], RN, [name], [name], [name] Entered By: [name], RN, [name], Kim on 04/19/2016 10:57:36

## 2016-04-19 NOTE — Progress Notes (Addendum)
Kristin, [name] ([id number]) Visit Report for 04/19/2016 Chief Complaint Document Details Patient Name: [name], [name] 04/19/2016 10:45 Date of Service: AM Medical Record [id number] Number: Patient Account Number: [id number] 08/13/1946 (70 y.o. Treating RN: [name] Date of Birth/Sex: Female) Other Clinician: Primary Care Physician: [facility], [name] Treating [name] Referring Physician: Glendon [name] Physician/Extender: [name]: 63 Information Obtained from: Patient Chief Complaint Patient presents to the wound care center for a consult due non healing wound 70 year old patient comes with a history of having a ulcer on the left lower extremity for the past 4 Kristin. she says she's had swelling of both lower extremities for about a year after she started having prednisone. 02/07/2015 -- her vascular appointments obtained were in the first and third week of June. she is able to go to [place] and we will try and get her some earlier appointments. Other than that nothing else has changed in her management. Electronic Signature(s) Signed: 04/19/2016 11:33:47 AM By: Kristin Fudge MD, [name] Entered By: [name] on 04/19/2016 11:33:47 [name], [name] ([id number]) -------------------------------------------------------------------------------- HPI Details Patient Name: [name], [name] 04/19/2016 10:45 Date of Service: AM Medical Record [id number] Number: Patient Account Number: [id number] 03-Apr-1946 (70 y.o. Treating RN: [name] Date of Birth/Sex: Female) Other Clinician: Primary Care Physician: [facility], [name] Treating [name] Referring Physician: Glendon [name] Physician/Extender: [name]: 21 History of Present Illness HPI Description: 70 year old patient who is known to have diabetes mellitus type 2, chronic renal insufficiency, coronary artery disease, hypertension, hypercholesterolemia, temporal arteritis and inflammatory arthritiss also  has a history of having a hysterectomy and some orthopedic related surgeries. The ulcer on the left lower extremity started off as a blister and then. Got progressively worse. She does not have any fever or chills and has not had any recent surgical intervention for this. Her last hemoglobin A1c was 10.1 in September 2015. She has been recently put on doxycycline by her PCP. She is now also allergic to doxycycline and was this was changed over to Keflex. 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 She does see a cardiologist and also takes a diuretic. 02/07/2015 her arterial and venous duplex studies to be done have dates been given as the first and third week of June. This is at [facility]. We are going to try and get early appointments at [facility]. other than that nothing has changed in her management. 02/14/2015 -- we have been able to get her an appointment in [facility] on May 20 which is much earlier than her previous ones at [place]. She continues with her prednisone and her sugars are in the range of 150-200. 02/21/2015 We were able to get a vascular lab workup for her today and she is going to be there at 2:00 this afternoon. the swelling of her leg has gone down significantly but she still has some tenderness over the wounds. 02/28/2015 - She has had one of two vascular workups done, and this coming Tuesday has another, at [facility]. She continues to be on steroid medications. She has significant sensitivity in her left lower extremity and has pain suggestive of neuropathic pain and I have asked her to address this with her primary care physician. 03/07/2015 -- The patient saw [name] for a consultation and he has had her arteries are okay but she has 2 incompetent veins on the  left lower extremity and he is going to set her up for surgery.  Official report is awaited. Addendum: Official reports are now available and on 03/04/2015. She was seen and lower extremity venous duplex exam was done. There was reflux present within the left greater saphenous vein below the knee and also the left small saphenous vein. Arterial duplex showed normal triphasic waveforms throughout the left lower extremity without any significant stenosis. Her ABIs were noncompressible bilaterally but a waveforms were normal and a digital pressures were normal bilaterally consistent with no significant arterial insufficiency. He has recommended endovenous ablation of both the left small saphenous and the left great saphenous vein. This would still be scheduled later. 03/14/2015 -- she has heard back from the vascular office and has surgery scheduled for sometime in July. [name], [name] ([id number]) Her rheumatologist has decreased her prednisone dosage but she still on it. She has also had cataract surgery in her right eye recently this week. 03/20/2015 - No new complaints today. Pain improved. No fever or chills. Tolerating 2 layer compression. 04/14/2015 -- she was doing very well today she went off on vacation and now her edema has increased markedly the ulceration is bigger and her diabetes is not under control. 04/21/2015 -- I spoke to her PCP [name] and discussed the management which would include being seen by a general surgeon for debridement and taking multiple punch biopsies which would help in establishing the diagnosis of this is a vasculitis. She is agreeable about this and will set her up for the procedure with Dr. Tamala Julian at [facility]. She was seen by the surgeon [name] 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 She is scheduled to have laser ablation done next week.Pt was sent here for possible biopsy to look for  vasculitis. Feel it would be better to wait after laser ablation is completed- the ulcer may heal fully and biopsy may not be necessary 04/29/2015 -- she had the venous ablation done by [name] last Friday and we do not have any notes yet. She is doing fine otherwise. 05/06/2015 --Review of her recent vascular intervention shows that she was seen by [name] on 04/29/2015. The follow-up duplex which was done showed that both the great saphenous vein and the small saphenous vein remained patent with reflux consistent with an unsuccessful ablation. He has rescheduled her for another the endovenous ablation to be done in about 4 weekso time. 05/13/2015 -- he was seen by her surgeon [name] who asked her to continue with conservative therapy and he would speak to [name] about her management. [name] is going to schedule her surgery in the middle of August for a repeat endovenous ablation. Her pus culture from last week has grown : [place] her noted her sensitivity report but due to her multiple allergies I had tried clindamycin and she developed a rash with this too. She has been prescribed and anti-buttocks in the ER and is has it at home and she will let is know what she is going to be taking. 05/20/2015 -- she has developed a small spot on her right lower extremity but besides that it is not a full fledged ulceration. She did not get to see [name] last week and hopefully she will see him in the near future. 05/27/2015 -she is still awaiting her  appointment with KristinDew and her vascular procedure is not scheduled until August 19. She will be seeing her PCP tomorrow and I have asked her to convey our discussion so that she is aware that debridement has not been done yet. 06/03/2015 -- was seen by her rheumatologist Dr. Dorthula Matas, who has been treating her for temporal arteritis and  in his note has mentioned the possibility of vasculitis or pyoderma gangrenosum. He is lowering her prednisone to 12-1/2 mg for 1 month and then 10 mg per the next month. I will again make an attempt to speak to her PCP [name] and her surgeon [name] to see if he can organize for a debridement in the OR with multiple biopsies to establish a diagnosis of vasculitis or pyoderma gangrenosum. 06/17/2015 -- KristinDew did her vascular procedure last week and a follow-up venous ultrasound shows good resolution of the veins as per the patient's history. He is to see her back in 2 Kristin. [name], [name] ([id number]) 07/07/2015. -- the patient has had a heavy growth of Proteus mirabilis and Enterococcus faecalis. These are sensitive to several drugs but the problem is she has allergies to all of these and hence I would like her to see Dr. Ola Spurr for this. She is also due to see [name] tomorrow and I will discussed the management with him including debridement under anesthesia and possible biopsies. 07/14/2015 -- she has an appointment to see Dr. Ola Spurr tomorrow and did see Dr. Bunnie Domino PA who will discuss my request with him. 07/21/2015 -- saw Dr. Ola Spurr was able to do a test on her and has put her on amoxicillin. She has been tolerating that and has had no problems with allergies to this. 07/28/2015 -- Last Friday I spoke to Dr. Leotis Pain regarding her care and he said that her right leg did not need any surgery and on the left leg was doing pretty good. We did agree that if she undergoes any procedure in the future he would do a couple of punch biopsies of the wound. 08/18/2015 -- her right leg is very tender and there is significant amount of slough. The left leg is looking much better 09/02/2015 -- she is going to have a debridement and punch biopsies of her right lower extremity by Dr. Leotis Pain this coming Thursday. Also seen Dr. Ola Spurr who has continued her on  ciprofloxacin. 09/09/2015 -- on 09/04/2015 Dr. Leotis Pain took her to surgery - Irrigation and excisional debridement of skin, soft tissue, and muscle to about 40 cm2 to the right posterior calf with biopsy. Pathology results are -- DIAGNOSIS: SKIN, RIGHT LOWER EXTREMITY; BIOPSY: SKIN AND SOFT TISSUE WITH ULCERATION, SEE NOTE. - NEGATIVE FOR DYSPLASIA AND MALIGNANCY. Note: There is acute inflammation and ulceration of the epidermis. The dermis shows nonspecific inflammation, neovascularization, and hemosiderin deposition. The differential diagnosis for these findings includes stasis dermatitis, nonspecific dermatitis, and infection. Correlation with clinical findings is required. A PAS fungal stain is obtained and results will be reported in an addendum. cultures were also sent and this grew Pseudomonas aeruginosa, Escherichia coli, Proteus mirabilis, Klebsiella pneumoniae and they were all sensitive to ciprofloxacin which she is on. 09/16/2015 -- he saw Dr. 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 He is going to call the patient in and discuss with her the management possibly with Imuran. I will continue treating  her locally. 09/23/2015 -- she saw Dr. Ola Spurr today who was going to continue the antibiotics for now and stop after this course. She has an appointment to see Dr. Jefm Bryant in about a week's time. Her wound VAC has arrived but she did not bring it with her today. We will try and set her up for changes to the right leg 3 times a week. She is here for her first application of Apligraf to the left lower extremity. 09/30/2015 -- she has seen Dr. 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 because she is ambulatory at home health will not apply a wound VAC and she will have  to come here 3 times a week on Monday Wednesday and Friday. 10/10/2015 -- she is here for a second application of Apligraf to her left lower extremity. 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 Jefm Bryant. Other than that she is doing well 10/31/2015 -- she is here for a third application of Apligraf to her left lower extremity. after reviewing her wound on the right side I noted that it is granulating extremely well and we will use the remnants of the Apligraf on the right leg. [name], [name] ([id number]) 11/14/2015 -- she was recently admitted to the hospital between January 11 and 11/10/2015 for uncontrolled hypertension, diabetes mellitus, acute kidney injury. during her admission she was supported by wound care and also by antibiotics. Around this time her immunosuppression was stopped by Dr. Nunzio Cory. She is feeling much better now. 11/21/2015 -- she has had her fourth application of Apligraf today and it has been shared between the left and the right leg 11/24/2015 -- was here for a wound VAC change but it was noticed that she had profuse bleeding from the lobe medial part of her right lower extremity and I was called in to evaluate her. 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 She has been doing fine since then 12/19/2015 -- she has finished 5 applications of Apligraf to the left lower extremity and this is doing great. He began with pain and swelling of her right ankle a couple of days ago and it has caused her much distress. She does not have any fever or any change in her general health. 12/22/2015 -- the patient's pain and swelling of the right lower extremity has resolved over the weekend. The left lower extremity is completely healed. She has had the first application of Apligraf on the right leg today. 01/02/2016 -- She is  here for the second application of Apligraf on the right leg today. 01/19/2016 -- she is here for the third application of Apligraf to the right posterior lateral leg. 02/02/2016 -- today she has had her fourth application of Apligraf and we have applied some of it to the right posterior lateral leg and the remaining waist we have used for the left anterior medial leg. 02/12/2016 -- today she is use the fifth application of Apligraf to the right posterior lateral leg. The remaining to be wasted has been applied to the left anterior medial leg. 03/01/2016 -- her copayment for the Apligraf is about $300 apiece and she's been to talk to her family to see if she can afford it. 03/15/2016 -- she was able to get some charity care to help with her  copayment and we have applied the first Apligraf to her right anterior medial leg. 03/29/2016 -- she is here for a second application to the right anterior medial leg. 04/05/2016 -- I had asked her to check with Dr. Jefm Bryant regarding her [name] with her immunosuppression for pyoderma gangrenosum and she has an appointment pending tomorrow. 04/12/2016 -- she saw KristinKernodle, at the clinic for rheumatology review. He recommended she resume Imuran at 74 and boost prednisone back to 5 mg temporarily. He also put her on Neurontin 100 mg 3 times daily. Electronic Signature(s) Signed: 04/19/2016 11:34:22 AM By: Kristin Fudge MD, [name] Entered By: [name] on 04/19/2016 11:34:21 Butner, ANISTYNN NISHIO ([id number]) -------------------------------------------------------------------------------- Physical Exam Details Patient Name: [name], [name] 04/19/2016 10:45 Date of Service: AM Medical Record [id number] Number: Patient Account Number: [id number] 1946/04/05 (70 y.o. Treating RN: [name] Date of Birth/Sex: Female) Other Clinician: Primary Care Physician: [facility], [name] Treating [name] Referring Physician: Glendon [name] Physician/Extender: Kristin  in [name]: 63 Constitutional . Pulse regular. Respirations normal and unlabored. Afebrile. . Eyes Nonicteric. Reactive to light. Ears, Nose, Mouth, and Throat Lips, teeth, and gums WNL.Marland Kitchen Moist mucosa without lesions. Neck supple and nontender. No palpable supraclavicular or cervical adenopathy. Normal sized without goiter. Respiratory WNL. No retractions.. Cardiovascular Pedal Pulses WNL. No clubbing, cyanosis or edema. Lymphatic No adneopathy. No adenopathy. No adenopathy. Musculoskeletal Adexa without tenderness or enlargement.. Digits and nails w/o clubbing, cyanosis, infection, petechiae, ischemia, or inflammatory conditions.. Integumentary (Hair, Skin) No suspicious lesions. No crepitus or fluctuance. No peri-wound warmth or erythema. No masses.Marland Kitchen Psychiatric Judgement and insight Intact.. No evidence of depression, anxiety, or agitation.. Notes the hyper granulation tissue on the right medial lower extremity is less and there is no surrounding debris. Electronic Signature(s) Signed: 04/19/2016 11:34:56 AM By: Kristin Fudge MD, [name] Entered By: [name] on 04/19/2016 11:34:56 [name], MILLANA STAHL ([id number]) -------------------------------------------------------------------------------- Physician Orders Details Patient Name: STEFFANI, BON [name] 04/19/2016 10:45 Date of Service: AM Medical Record [id number] Number: Patient Account Number: [id number] [birth year] (70 y.o. Treating RN: [name] Date of Birth/Sex: Female) Other Clinician: Primary Care Physician: [facility], [name] Treating [name] Referring Physician: Glendon [name] Physician/Extender: Suella [place] in [name]: 64 Verbal / Phone Orders: Yes Clinician: Cornell [name] Read Back and Verified: Yes Diagnosis Coding Wound Cleansing Wound #6 Right,Medial Lower Leg o Clean wound with Normal Saline. Anesthetic Wound #6 Right,Medial Lower Leg o Topical Lidocaine 4% cream applied to wound bed prior to  debridement Primary Wound Dressing Wound #6 Right,Medial Lower Leg o Other: - RTD Secondary Dressing Wound #6 Right,Medial Lower Leg o Gauze and Kerlix/Conform Dressing Change Frequency Wound #6 Right,Medial Lower Leg o Change dressing every other day. Follow-up Appointments Wound #6 Right,Medial Lower Leg o Return Appointment in 1 week. Edema Control Wound #6 Right,Medial Lower Leg o Elevate legs to the level of the heart and pump ankles as often as possible o Other: - compression hose Electronic Signature(s) Signed: 04/19/2016 4:13:37 PM By: Kristin Fudge MD, [name] Signed: 04/19/2016 5:08:33 PM By: [name] RN, [name], [name], [name] Kresse, Naoko [name] ([id number]) Entered By: [name], RN, [name], Kim on 04/19/2016 11:15:44 Doepke, [name] ([id number]) -------------------------------------------------------------------------------- Problem List Details Patient Name: [name], [name] 04/19/2016 10:45 Date of Service: AM Medical Record [id number] Number: Patient Account Number: [id number] 1945-12-24 (70 y.o. Treating RN: [name] Date of Birth/Sex: Female) Other Clinician: Primary Care Physician: [name] Treating [name] Referring Physician: Glendon [name] Physician/Extender: [name]: 59 Active Problems ICD-10 Encounter Code Description Active Date Diagnosis E11.622  Type 2 diabetes mellitus with other skin ulcer 01/31/2015 Yes E66.01 Morbid (severe) obesity due to excess calories 01/31/2015 Yes I89.0 Lymphedema, not elsewhere classified 01/31/2015 Yes L97.212 Non-pressure chronic ulcer of right calf with fat layer 11/24/2015 Yes exposed I87.311 Chronic venous hypertension (idiopathic) with ulcer of 11/24/2015 Yes right lower extremity Inactive Problems Resolved Problems ICD-10 Code Description Active Date Resolved Date L97.322 Non-pressure chronic ulcer of left ankle with fat layer 01/31/2015 01/31/2015 exposed [id number] Varicose veins of left lower extremity  with both ulcer of 03/07/2015 03/07/2015 calf and inflammation I83.223 03/07/2015 03/07/2015 Rathod, Amori [name] ([id number]) 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 L03.116 Cellulitis of left lower limb 07/07/2015 07/07/2015 Electronic Signature(s) Signed: 04/19/2016 11:33:40 AM By: Kristin Fudge MD, [name] Entered By: [name] on 04/19/2016 11:33:39 Fennewald, Tia Masker ([id number]) -------------------------------------------------------------------------------- Progress Note Details Patient Name: Kristin Stare [name] 04/19/2016 10:45 Date of Service: AM Medical Record [id number] Number: Patient Account Number: [id number] [birth year] (70 y.o. Treating RN: [name] Date of Birth/Sex: Female) Other Clinician: Primary Care Physician: [facility], [name] Treating [name] Referring Physician: Glendon [name] Physician/Extender: [name]: 11 Subjective Chief Complaint Information obtained from Patient Patient presents to the wound care center for a consult due non healing wound 70 year old patient comes with a history of having a ulcer on the left lower extremity for the past 4 Kristin. she says she's had swelling of both lower extremities for about a year after she started having prednisone. 02/07/2015 -- her vascular appointments obtained were in the first and third week of June. she is able to go to [place] and we will try and get her some earlier appointments. Other than that nothing else has changed in her management. History of Present Illness (HPI) 70 year old patient who is known to have diabetes mellitus type 2, chronic renal insufficiency, coronary artery disease, hypertension, hypercholesterolemia, temporal arteritis and inflammatory arthritiss also has a history of having a hysterectomy and some orthopedic related surgeries. The ulcer on the left lower extremity started off as a blister and then. Got progressively worse. She does not have  any fever or chills and has not had any recent surgical intervention for this. Her last hemoglobin A1c was 10.1 in September 2015. She has been recently put on doxycycline by her PCP. She is now also allergic to doxycycline and was this was changed over to Keflex. 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 She does see a cardiologist and also takes a diuretic. 02/07/2015 her arterial and venous duplex studies to be done have dates been given as the first and third week of June. This is at [facility]. We are going to try and get early appointments at [facility]. other than that nothing has changed in her management. 02/14/2015 -- we have been able to get her an appointment in [facility] on May 20 which is much earlier than her previous ones at [place]. She continues with her prednisone and her sugars are in the range of 150-200. 02/21/2015 We were able to get a vascular lab workup for her today and she is going to be there at 2:00 this afternoon. the swelling of her leg has gone down significantly but she still has some tenderness over the wounds. 02/28/2015 - She has had one of two vascular workups done, and this coming Tuesday has another, at [facility].  She continues to be on steroid medications. She has significant sensitivity in her left lower extremity and has pain suggestive of neuropathic pain and I have asked her to address this with her primary care physician. 03/07/2015 -- The patient saw [name] for a consultation and he has had her arteries are okay but she has 2 incompetent veins on the left lower extremity and he is going to set her up for surgery. Official report is KAHMYA, PADALINO ([id number]) awaited. Addendum: Official reports are now available and on 03/04/2015. She was seen and lower extremity venous duplex exam was done. There was  reflux present within the left greater saphenous vein below the knee and also the left small saphenous vein. Arterial duplex showed normal triphasic waveforms throughout the left lower extremity without any significant stenosis. Her ABIs were noncompressible bilaterally but a waveforms were normal and a digital pressures were normal bilaterally consistent with no significant arterial insufficiency. He has recommended endovenous ablation of both the left small saphenous and the left great saphenous vein. This would still be scheduled later. 03/14/2015 -- she has heard back from the vascular office and has surgery scheduled for sometime in July. Her rheumatologist has decreased her prednisone dosage but she still on it. She has also had cataract surgery in her right eye recently this week. 03/20/2015 - No new complaints today. Pain improved. No fever or chills. Tolerating 2 layer compression. 04/14/2015 -- she was doing very well today she went off on vacation and now her edema has increased markedly the ulceration is bigger and her diabetes is not under control. 04/21/2015 -- I spoke to her PCP [name] and discussed the management which would include being seen by a general surgeon for debridement and taking multiple punch biopsies which would help in establishing the diagnosis of this is a vasculitis. She is agreeable about this and will set her up for the procedure with Dr. Tamala Julian at [facility]. She was seen by the surgeon [name] 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 She is scheduled to have laser ablation done next week.Pt was sent here for possible biopsy to look for vasculitis. Feel it would be better to wait after laser ablation is completed- the ulcer may heal fully and biopsy may not be necessary 04/29/2015 -- she had the venous ablation done by [name] last  Friday and we do not have any notes yet. She is doing fine otherwise. 05/06/2015 --Review of her recent vascular intervention shows that she was seen by [name] on 04/29/2015. The follow-up duplex which was done showed that both the great saphenous vein and the small saphenous vein remained patent with reflux consistent with an unsuccessful ablation. He has rescheduled her for another the endovenous ablation to be done in about 4 Kristin time. 05/13/2015 -- he was seen by her surgeon [name] who asked her to continue with conservative therapy and he would speak to [name] about her management. [name] is going to schedule her surgery in the middle of August for a repeat endovenous ablation. Her pus culture from last week has grown : Buckner her noted her sensitivity report but due to her multiple allergies I had tried clindamycin and she developed a rash with this too. She has been prescribed and anti-buttocks in the ER and is has it at home  and she will let is know what she is going to be taking. 05/20/2015 -- she has developed a small spot on her right lower extremity but besides that it is not a full fledged ulceration. She did not get to see [name] last week and hopefully she will see him in the near future. 05/27/2015 -she is still awaiting her appointment with KristinDew and her vascular procedure is not scheduled until August 19. She will be seeing her PCP tomorrow and I have asked her to convey our discussion so that she is aware that debridement has not been done yet. [name], [name] ([id number]) 06/03/2015 -- was seen by her rheumatologist Dr. 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 He is lowering her prednisone to 12-1/2 mg for 1 month and then 10 mg per the next month. I  will again make an attempt to speak to her PCP [name] and her surgeon [name] to see if he can organize for a debridement in the OR with multiple biopsies to establish a diagnosis of vasculitis or pyoderma gangrenosum. 06/17/2015 -- KristinDew did her vascular procedure last week and a follow-up venous ultrasound shows good resolution of the veins as per the patient's history. He is to see her back in 2 Kristin. 07/07/2015. -- the patient has had a heavy growth of Proteus mirabilis and Enterococcus faecalis. These are sensitive to several drugs but the problem is she has allergies to all of these and hence I would like her to see Dr. Ola Spurr for this. She is also due to see [name] tomorrow and I will discussed the management with him including debridement under anesthesia and possible biopsies. 07/14/2015 -- she has an appointment to see Dr. Ola Spurr tomorrow and did see Dr. Bunnie Domino PA who will discuss my request with him. 07/21/2015 -- saw Dr. Ola Spurr was able to do a test on her and has put her on amoxicillin. She has been tolerating that and has had no problems with allergies to this. 07/28/2015 -- Last Friday I spoke to Dr. Leotis Pain regarding her care and he said that her right leg did not need any surgery and on the left leg was doing pretty good. We did agree that if she undergoes any procedure in the future he would do a couple of punch biopsies of the wound. 08/18/2015 -- her right leg is very tender and there is significant amount of slough. The left leg is looking much better 09/02/2015 -- she is going to have a debridement and punch biopsies of her right lower extremity by Dr. Leotis Pain this coming Thursday. Also seen Dr. Ola Spurr who has continued her on ciprofloxacin. 09/09/2015 -- on 09/04/2015 Dr. Leotis Pain took her to surgery - Irrigation and excisional debridement of skin, soft tissue, and muscle to about 40 cm2 to the right posterior calf with biopsy. Pathology results  are -- DIAGNOSIS: SKIN, RIGHT LOWER EXTREMITY; BIOPSY: SKIN AND SOFT TISSUE WITH ULCERATION, SEE NOTE. - NEGATIVE FOR DYSPLASIA AND MALIGNANCY. Note: There is acute inflammation and ulceration of the epidermis. The dermis shows nonspecific inflammation, neovascularization, and hemosiderin deposition. The differential diagnosis for these findings includes stasis dermatitis, nonspecific dermatitis, and infection. Correlation with clinical findings is required. A PAS fungal stain is obtained and results will be reported in an addendum. cultures were also sent and this grew Pseudomonas aeruginosa, Escherichia coli, Proteus mirabilis, Klebsiella pneumoniae and they  were all sensitive to ciprofloxacin which she is on. 09/16/2015 -- he saw Dr. 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 He is going to call the patient in and discuss with her the management possibly with Imuran. I will continue treating her locally. 09/23/2015 -- she saw Dr. Ola Spurr today who was going to continue the antibiotics for now and stop after this course. She has an appointment to see Dr. Jefm Bryant in about a week's time. Her wound VAC has arrived but she did not bring it with her today. We will try and set her up for changes to the right leg 3 times a week. She is here for her first application of Apligraf to the left lower extremity. [name], [name] ([id number]) 09/30/2015 -- she has seen Dr. 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 because she is ambulatory at home health will not apply a wound VAC and she will have to come here 3 times a week on Monday Wednesday and Friday. 10/10/2015 -- she is here for a second application of Apligraf to her left lower extremity. 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 Jefm Bryant. Other than that she is doing well 10/31/2015 -- she is here for a third application of Apligraf to her left lower extremity. after reviewing her wound on the right side I noted that it is granulating extremely well and we will use the remnants of the Apligraf on the right leg. 11/14/2015 -- she was recently admitted to the hospital between January 11 and 11/10/2015 for uncontrolled hypertension, diabetes mellitus, acute kidney injury. during her admission she was supported by wound care and also by antibiotics. Around this time her immunosuppression was stopped by Dr. Nunzio Cory. She is feeling much better now. 11/21/2015 -- she has had her fourth application of Apligraf today and it has been shared between the left and the right leg 11/24/2015 -- was here for a wound VAC change but it was noticed that she had profuse bleeding from the lobe medial part of her right lower extremity and I was called in to evaluate her. 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 She has been doing fine since then 12/19/2015 -- she has finished 5 applications of Apligraf to the left lower extremity and this is doing great. He began with pain and swelling of her right ankle a couple of days ago and it has caused her much distress. She does not have any fever or any change in her general health. 12/22/2015 -- the patient's pain and swelling of the right lower extremity has resolved over the weekend. The left lower extremity is completely healed. She has had the first application of Apligraf on the right leg today. 01/02/2016 -- She is here for the second application of Apligraf on the right leg today. 01/19/2016 -- she is here for the third application of Apligraf to the right posterior lateral leg. 02/02/2016 -- today she has had her fourth application of  Apligraf and we have applied some of it to the right posterior lateral leg and the remaining waist we have used for the left anterior medial leg. 02/12/2016 -- today she is use the fifth application  of Apligraf to the right posterior lateral leg. The remaining to be wasted has been applied to the left anterior medial leg. 03/01/2016 -- her copayment for the Apligraf is about $300 apiece and she's been to talk to her family to see if she can afford it. 03/15/2016 -- she was able to get some charity care to help with her copayment and we have applied the first Apligraf to her right anterior medial leg. 03/29/2016 -- she is here for a second application to the right anterior medial leg. 04/05/2016 -- I had asked her to check with Dr. Jefm Bryant regarding her [name] with her immunosuppression for pyoderma gangrenosum and she has an appointment pending tomorrow. 04/12/2016 -- she saw KristinKernodle, at the clinic for rheumatology review. He recommended she resume Imuran at 75 and boost prednisone back to 5 mg temporarily. He also put her on Neurontin 100 mg 3 times daily. Objective [name], [name] ([id number]) Constitutional Pulse regular. Respirations normal and unlabored. Afebrile. Vitals Time Taken: 10:56 AM, Height: 65 in, Weight: 248 lbs, BMI: 41.3, Temperature: 97.6 F, Pulse: 62 bpm, Respiratory Rate: 18 breaths/min, Blood Pressure: 156/63 mmHg. Eyes Nonicteric. Reactive to light. Ears, Nose, Mouth, and Throat Lips, teeth, and gums WNL.Marland Kitchen Moist mucosa without lesions. Neck supple and nontender. No palpable supraclavicular or cervical adenopathy. Normal sized without goiter. Respiratory WNL. No retractions.. Cardiovascular Pedal Pulses WNL. No clubbing, cyanosis or edema. Lymphatic No adneopathy. No adenopathy. No adenopathy. Musculoskeletal Adexa without tenderness or enlargement.. Digits and nails w/o clubbing, cyanosis, infection, petechiae, ischemia, or inflammatory  conditions.Marland Kitchen Psychiatric Judgement and insight Intact.. No evidence of depression, anxiety, or agitation.. General Notes: the hyper granulation tissue on the right medial lower extremity is less and there is no surrounding debris. Integumentary (Hair, Skin) No suspicious lesions. No crepitus or fluctuance. No peri-wound warmth or erythema. No masses.. Wound #6 status is Open. Original cause of wound was Gradually Appeared. The wound is located on the Right,Medial Lower Leg. The wound measures 1cm length x 0.9cm width x 0.1cm depth; 0.707cm^2 area and 0.071cm^3 volume. The wound is limited to skin breakdown. There is no tunneling or undermining noted. There is a large amount of serous drainage noted. The wound margin is flat and intact. There is large (67-100%) red granulation within the wound bed. There is no necrotic tissue within the wound bed. The periwound skin appearance exhibited: Moist. The periwound skin appearance did not exhibit: Callus, Crepitus, Excoriation, Fluctuance, Friable, Induration, Localized Edema, Rash, Scarring, Dry/Scaly, Maceration, Atrophie Blanche, Cyanosis, Ecchymosis, Hemosiderin Staining, Mottled, Pallor, Rubor, Erythema. Periwound temperature was noted as No Abnormality. The periwound has tenderness on Talamo, Mychele [name] ([id number]) palpation. 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 Anesthetic: Wound #6 Right,Medial Lower Leg: Topical Lidocaine 4% cream applied to wound bed prior to debridement Primary Wound Dressing: Wound #6 Right,Medial Lower Leg: Other: -  RTD Secondary Dressing: Wound #6 Right,Medial Lower Leg: Gauze and Kerlix/Conform Dressing Change Frequency: Wound #6 Right,Medial Lower Leg: Change dressing every other day. Follow-up Appointments: Wound #6 Right,Medial Lower Leg: Return Appointment in 1 week. Edema Control: Wound #6 Right,Medial Lower Leg: Elevate legs to the level of the heart and  pump ankles as often as possible Other: - compression hose Saephan, Doloris [name] ([id number]) I recommended RTD and local compression to be carried out with elevation and exercise. I was also pleased to note that she is back on her immunosuppression which will help combating her other multiple rheumatology problems. we will see her back next week Electronic Signature(s) Signed: 04/19/2016 11:35:38 AM By: Kristin Fudge MD, [name] Entered By: [name] on 04/19/2016 11:35:37 Tartt, Lysette [name] ([id number]) -------------------------------------------------------------------------------- SuperBill Details Patient Name: Kristin Stare [name] Date of Service: 04/19/2016 Medical Record Number: [id number] Patient Account Number: [id number] Date of Birth/Sex: [birth year] (70 y.o. Female) Treating RN: [name] Primary Care Physician: [name] Other Clinician: Referring Physician: Glendon [name] Treating Physician/Extender: Frann Rider in [name]: 62 Diagnosis Coding ICD-10 Codes Code Description E11.622 Type 2 diabetes mellitus with other skin ulcer E66.01 Morbid (severe) obesity due to excess calories I89.0 Lymphedema, not elsewhere classified L97.212 Non-pressure chronic ulcer of right calf with fat layer exposed I87.311 Chronic venous hypertension (idiopathic) with ulcer of right lower extremity Facility Procedures CPT4 Code: [id number] Description: [id number] - WOUND CARE VISIT-LEV 2 EST PT Modifier: Quantity: 1 Physician Procedures CPT4 Code Description: [id number] - WC PHYS LEVEL 3 - EST PT ICD-10 Description Diagnosis E11.622  Type 2 diabetes mellitus with other skin ulcer I89.0 Lymphedema, not elsewhere classified L97.212 Non-pressure chronic ulcer of right calf with fat la  I87.311 Chronic venous hypertension (idiopathic) with ulcer Modifier: yer exposed of right lower Quantity: 1 extremity Electronic Signature(s) Signed: 04/19/2016 11:36:01 AM By: Kristin Fudge MD, [name] Entered By: [name] on 04/19/2016 11:36:01

## 2016-04-26 ENCOUNTER — Encounter: Payer: Commercial Managed Care - HMO | Attending: Surgery | Admitting: Surgery

## 2016-04-26 DIAGNOSIS — E11622 Type 2 diabetes mellitus with other skin ulcer: Secondary | ICD-10-CM | POA: Insufficient documentation

## 2016-04-26 DIAGNOSIS — E1122 Type 2 diabetes mellitus with diabetic chronic kidney disease: Secondary | ICD-10-CM | POA: Diagnosis not present

## 2016-04-26 DIAGNOSIS — E7800 Pure hypercholesterolemia, unspecified: Secondary | ICD-10-CM | POA: Diagnosis not present

## 2016-04-26 DIAGNOSIS — L97212 Non-pressure chronic ulcer of right calf with fat layer exposed: Secondary | ICD-10-CM | POA: Diagnosis not present

## 2016-04-26 DIAGNOSIS — I890 Lymphedema, not elsewhere classified: Secondary | ICD-10-CM | POA: Insufficient documentation

## 2016-04-26 DIAGNOSIS — Z90710 Acquired absence of both cervix and uterus: Secondary | ICD-10-CM | POA: Insufficient documentation

## 2016-04-26 DIAGNOSIS — M316 Other giant cell arteritis: Secondary | ICD-10-CM | POA: Insufficient documentation

## 2016-04-26 DIAGNOSIS — I2510 Atherosclerotic heart disease of native coronary artery without angina pectoris: Secondary | ICD-10-CM | POA: Insufficient documentation

## 2016-04-26 DIAGNOSIS — N189 Chronic kidney disease, unspecified: Secondary | ICD-10-CM | POA: Insufficient documentation

## 2016-04-26 DIAGNOSIS — I129 Hypertensive chronic kidney disease with stage 1 through stage 4 chronic kidney disease, or unspecified chronic kidney disease: Secondary | ICD-10-CM | POA: Insufficient documentation

## 2016-04-26 NOTE — Progress Notes (Signed)
LIYA, LIONS ([id number]) Visit Report for 04/26/2016 Arrival Information Details Patient Name: [name], [name] Date of Service: 04/26/2016 8:00 AM Medical Record Number: [id number] Patient Account Number: [id number] Date of Birth/Sex: 1946-05-27 (70 y.o. Female) Treating RN: Carolyne Fiscal, Debi Primary Care Physician: Glendon Axe Other Clinician: Referring Physician: Glendon Axe Treating Physician/Extender: Frann Rider in Treatment: 33 Visit Information History Since Last Visit All ordered tests and consults were completed: No Patient Arrived: Ambulatory Added or deleted any medications: No Arrival Time: 08:05 Any new allergies or adverse reactions: No Accompanied By: self Had a fall or experienced change in No Transfer Assistance: None activities of daily living that may affect Patient Identification Verified: Yes risk of falls: Secondary Verification Process Yes Signs or symptoms of abuse/neglect since last No Completed: visito Patient Requires Transmission- No Hospitalized since last visit: No Based Precautions: Pain Present Now: No Patient Has Alerts: Yes Patient Alerts: Patient on Blood Thinner Electronic Signature(s) Signed: 04/26/2016 4:51:59 PM By: Alric Quan Entered By: Alric Quan on 04/26/2016 08:06:15 [name], [name] ([id number]) -------------------------------------------------------------------------------- Encounter Discharge Information Details Patient Name: [name], [name] Date of Service: 04/26/2016 8:00 AM Medical Record Number: [id number] Patient Account Number: [id number] Date of Birth/Sex: 01-Apr-1946 (70 y.o. Female) Treating RN: Carolyne Fiscal, Debi Primary Care Physician: Glendon Axe Other Clinician: Referring Physician: Glendon Axe Treating Physician/Extender: Frann Rider in Treatment: 28 Encounter Discharge Information Items Discharge Pain Level: 0 Discharge Condition: Stable Ambulatory Status:  Ambulatory Discharge Destination: Home Transportation: Private Auto Accompanied By: self Schedule Follow-up Appointment: Yes Medication Reconciliation completed and provided to Patient/Care Yes Jhanae Jaskowiak: Provided on Clinical Summary of Care: 04/26/2016 Form Type Recipient Paper Patient LM Electronic Signature(s) Signed: 04/26/2016 8:32:01 AM By: Ruthine Dose Entered By: Ruthine Dose on 04/26/2016 08:32:01 [name], [name] ([id number]) -------------------------------------------------------------------------------- Lower Extremity Assessment Details Patient Name: Kristin Stare [name] Date of Service: 04/26/2016 8:00 AM Medical Record Number: [id number] Patient Account Number: [id number] Date of Birth/Sex: 09/19/1946 (70 y.o. Female) Treating RN: Carolyne Fiscal, Debi Primary Care Physician: Glendon Axe Other Clinician: Referring Physician: Glendon Axe Treating Physician/Extender: Frann Rider in Treatment: 64 Vascular Assessment Pulses: Posterior Tibial Dorsalis Pedis Palpable: [Right:Yes] Extremity colors, hair growth, and conditions: Extremity Color: [Right:Mottled] Temperature of Extremity: [Right:Warm] Capillary Refill: [Right:< 3 seconds] Toe Nail Assessment Left: Right: Thick: No Discolored: No Deformed: No Improper Length and Hygiene: No Electronic Signature(s) Signed: 04/26/2016 4:51:59 PM By: Alric Quan Entered By: Alric Quan on 04/26/2016 08:09:24 [name], [name] ([id number]) -------------------------------------------------------------------------------- Multi Wound Chart Details Patient Name: Kristin Stare [name] Date of Service: 04/26/2016 8:00 AM Medical Record Number: [id number] Patient Account Number: [id number] Date of Birth/Sex: [birth year] (70 y.o. 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] [N/A:N/A] Comorbid History: [6:Cataracts, Asthma, Coronary Artery Disease, Hypertension, Type II Diabetes, Osteoarthritis, Neuropathy] [N/A:N/A] Date Acquired: [6:10/10/2015] [N/A:N/A] Weeks of Treatment: [6:28] [N/A:N/A] Wound Status: [6:Open] [N/A:N/A] Measurements L x W x [name] 0.7x0.5x0.1 [N/A:N/A] (cm) Area (cm) : [6:0.275] [N/A:N/A] Volume (cm) : [6:0.027] [N/A:N/A] % Reduction in Area: [6:92.00%] [N/A:N/A] % Reduction in Volume: 92.20% [N/A:N/A] Classification: [6:Grade 1] [N/A:N/A] Exudate Amount: [6:Large] [N/A:N/A] Exudate Type: [6:Serous] [N/A:N/A] Exudate Color: [  6:amber] [N/A:N/A] Wound Margin: [6:Flat and Intact] [N/A:N/A] Granulation Amount: [6:Large (67-100%)] [N/A:N/A] Granulation Quality: [6:Red, Hyper-granulation] [N/A:N/A] Necrotic Amount: [6:Small (1-33%)] [N/A:N/A] Exposed Structures: [6:Fascia: No Fat: No] [N/A:N/A] Tendon: No Muscle: No Joint: No Bone: No Limited to Skin Breakdown Epithelialization: Large (67-100%) N/A N/A Periwound Skin Texture: Edema: No N/A N/A Excoriation: No Induration: No Callus: No Crepitus: No Fluctuance: No Friable: No Rash: No Scarring: No Periwound Skin Moist: Yes N/A N/A Moisture: Maceration: No Dry/Scaly: No Periwound Skin Color: Atrophie Blanche: No N/A N/A Cyanosis: No Ecchymosis: No Erythema: No Hemosiderin Staining: No Mottled: No Pallor: No Rubor: No Temperature: No Abnormality N/A N/A Tenderness on Yes N/A N/A Palpation: Wound Preparation: Ulcer Cleansing: N/A  N/A Rinsed/Irrigated with Saline Topical Anesthetic Applied: None, Other: lidocaine 4% Treatment Notes Electronic Signature(s) Signed: 04/26/2016 4:51:59 PM By: Alric Quan Entered By: Alric Quan on 04/26/2016 08:14:09 [name], [name] ([id number]) -------------------------------------------------------------------------------- Multi-Disciplinary Care Plan Details Patient Name: KESHANA, MELVIN [name] Date of Service: 04/26/2016 8:00 AM Medical Record Number: [id number] Patient Account Number: [id number] Date of Birth/Sex: 1946/10/08 (70 y.o. Female) Treating RN: Carolyne Fiscal, Debi Primary Care Physician: Glendon Axe Other Clinician: Referring Physician: Glendon Axe Treating Physician/Extender: Frann Rider in Treatment: 69 Active Inactive Orientation to the Wound Care Program Nursing Diagnoses: Knowledge deficit related to the wound healing center program Goals: Patient/caregiver will verbalize understanding of the [place] Program Date Initiated: 01/31/2015 Goal Status: Active Interventions: Provide education on orientation to the wound center Notes: Venous Leg Ulcer Nursing Diagnoses: Potential for venous Insuffiency (use before diagnosis confirmed) Goals: Non-invasive venous studies are completed as ordered Date Initiated: 01/31/2015 Goal Status: Active Patient/caregiver will verbalize understanding of disease process and disease management Date Initiated: 01/31/2015 Goal Status: Active Interventions: Assess peripheral edema status every visit. Notes: Wound/Skin Impairment Nursing Diagnoses: Impaired tissue integrity Knowledge deficit related to smoking impact on wound healing [name], [name] ([id number]) Goals: Patient/caregiver will verbalize understanding of skin care regimen Date Initiated: 01/31/2015 Goal Status: Active Ulcer/skin breakdown will heal within 14 weeks Date Initiated: 01/31/2015 Goal Status: Active Interventions: Assess  ulceration(s) every visit Notes: Electronic Signature(s) Signed: 04/26/2016 4:51:59 PM By: Alric Quan Entered By: Alric Quan on 04/26/2016 08:13:34 [name], [name] ([id number]) -------------------------------------------------------------------------------- Pain Assessment Details Patient Name: Kristin Stare [name] Date of Service: 04/26/2016 8:00 AM Medical Record Number: [id number] Patient Account Number: [id number] Date of Birth/Sex: May 27, 1946 (70 y.o. Female) Treating RN: Carolyne Fiscal, Debi Primary Care Physician: Glendon Axe Other Clinician: Referring Physician: Glendon Axe Treating Physician/Extender: Frann Rider in Treatment: 50 Active Problems Location of Pain Severity and Description of Pain Patient Has Paino No Site Locations Pain Management and Medication Current Pain Management: Electronic Signature(s) Signed: 04/26/2016 4:51:59 PM By: Alric Quan Entered By: Alric Quan on 04/26/2016 08:06:21 [name], [name] ([id number]) -------------------------------------------------------------------------------- Patient/Caregiver Education Details Patient Name: [name], [name] Date of Service: 04/26/2016 8:00 AM Medical Record Number: [id number] Patient Account Number: [id number] Date of Birth/Gender: 10-Oct-1946 (70 y.o. Female) Treating RN: Carolyne Fiscal, Debi Primary Care Physician: Glendon Axe Other Clinician: Referring Physician: Glendon Axe Treating Physician/Extender: Frann Rider in Treatment: 97 Education Assessment Education Provided To: Patient Education Topics Provided Wound/Skin Impairment: Handouts: Other: change dressing as ordered Methods: Demonstration, Explain/Verbal Responses: State content correctly Electronic Signature(s) Signed: 04/26/2016 4:51:59 PM By: Alric Quan Entered By: Alric Quan on 04/26/2016 08:30:27 [name], [name]  ([id number]) -------------------------------------------------------------------------------- Wound Assessment Details Patient Name: [name], [name] Date of Service: 04/26/2016 8:00 AM Medical Record Number: [id number] Patient Account Number: [id number] Date of Birth/Sex:  05/30/1946 (70 y.o. Female) Treating RN: Carolyne Fiscal, Debi Primary Care Physician: Glendon Axe Other Clinician: Referring Physician: Glendon Axe Treating Physician/Extender: Frann Rider in Treatment: 64 Wound Status Wound Number: 6 Primary Diabetic Wound/Ulcer of the Lower Etiology: Extremity Wound Location: Right Lower Leg - Medial Secondary Pyoderma Wounding Event: Gradually Appeared Etiology: Date Acquired: 10/10/2015 Wound Open Weeks Of Treatment: 28 Status: Clustered Wound: No Comorbid Cataracts, Asthma, Coronary Artery History: Disease, Hypertension, Type II Diabetes, Osteoarthritis, Neuropathy Photos Photo Uploaded By: Alric Quan on 04/26/2016 11:49:15 Wound Measurements Length: (cm) 0.7 Width: (cm) 0.5 Depth: (cm) 0.1 Area: (cm) 0.275 Volume: (cm) 0.027 % Reduction in Area: 92% % Reduction in Volume: 92.2% Epithelialization: Large (67-100%) Tunneling: No Undermining: No Wound Description Classification: Grade 1 Foul Odor After Wound Margin: Flat and Intact Exudate Amount: Large Exudate Type: Serous Exudate Color: amber Cleansing: No Wound Bed Granulation Amount: Large (67-100%) Exposed Structure Granulation Quality: Red, Hyper-granulation Fascia Exposed: No Crosson, Sadira [name] ([id number]) Necrotic Amount: Small (1-33%) Fat Layer Exposed: No Necrotic Quality: Adherent Slough Tendon Exposed: No Muscle Exposed: No Joint Exposed: No Bone Exposed: No Limited to Skin Breakdown Periwound Skin Texture Texture Color No Abnormalities Noted: No No Abnormalities Noted: No Callus: No Atrophie Blanche: No Crepitus: No Cyanosis: No Excoriation: No Ecchymosis:  No Fluctuance: No Erythema: No Friable: No Hemosiderin Staining: No Induration: No Mottled: No Localized Edema: No Pallor: No Rash: No Rubor: No Scarring: No Temperature / Pain Moisture Temperature: No Abnormality No Abnormalities Noted: No Tenderness on Palpation: Yes Dry / Scaly: No Maceration: No Moist: Yes Wound Preparation Ulcer Cleansing: Rinsed/Irrigated with Saline Topical Anesthetic Applied: None, Other: lidocaine 4%, Treatment Notes Wound #6 (Right, Medial Lower Leg) 1. Cleansed with: Clean wound with Normal Saline 2. Anesthetic Topical Lidocaine 4% cream to wound bed prior to debridement 4. Dressing Applied: Other dressing (specify in notes) 5. Secondary Dressing Applied Dry Gauze Kerlix/Conform 7. Secured with Tape Notes RTD, gauze and conform secured with stretch net Electronic Signature(s) FILOMENA, ADAMEC ([id number]) Signed: 04/26/2016 4:51:59 PM By: Alric Quan Entered By: Alric Quan on 04/26/2016 08:13:23 Graff, Cookie [name] ([id number]) -------------------------------------------------------------------------------- Vitals Details Patient Name: KAMOREE, LOFTUS [name] Date of Service: 04/26/2016 8:00 AM Medical Record Number: [id number] Patient Account Number: [id number] Date of Birth/Sex: 14-Oct-1946 (70 y.o. Female) Treating RN: Carolyne Fiscal, Debi Primary Care Physician: Glendon Axe Other Clinician: Referring Physician: Glendon Axe Treating Physician/Extender: Frann Rider in Treatment: 48 Vital Signs Time Taken: 08:06 Temperature (F): 97.7 Height (in): 65 Pulse (bpm): 58 Weight (lbs): 248 Respiratory Rate (breaths/min): 20 Body Mass Index (BMI): 41.3 Blood Pressure (mmHg): 160/61 Reference Range: 80 - 120 mg / dl Electronic Signature(s) Signed: 04/26/2016 4:51:59 PM By: Alric Quan Entered By: Alric Quan on 04/26/2016 08:06:49

## 2016-04-26 NOTE — Progress Notes (Addendum)
ALONNI, BERGGREN ([id number]) Visit Report for 04/26/2016 Chief Complaint Document Details Patient Name: [name], [name] Date of Service: 04/26/2016 8:00 AM Medical Record Number: [id number] Patient Account Number: [id number] Date of Birth/Sex: June 26, 1946 (70 y.o. Female) Treating RN: Carolyne Fiscal, Debi Primary Care Physician: Glendon Axe Other Clinician: Referring Physician: Glendon Axe Treating Physician/Extender: Frann Rider in Treatment: 41 Information Obtained from: Patient Chief Complaint Patient presents to the wound care center for a consult due non healing wound 70 year old patient comes with a history of having a ulcer on the left lower extremity for the past 4 weeks. she says she's had swelling of both lower extremities for about a year after she started having prednisone. 02/07/2015 -- her vascular appointments obtained were in the first and third week of June. she is able to go to [name] and we will try and get her some earlier appointments. Other than that nothing else has changed in her management. Electronic Signature(s) Signed: 04/26/2016 8:23:24 AM By: [name], [name] Entered By: [name] on 04/26/2016 [id number] [name], [name] ([id number]) -------------------------------------------------------------------------------- Debridement Details Patient Name: [name], [name] Date of Service: 04/26/2016 8:00 AM Medical Record Number: [id number] Patient Account Number: [id number] Date of Birth/Sex: 11/09/1945 (70 y.o. Female) Treating RN: Carolyne Fiscal, Debi Primary Care Physician: Glendon Axe Other Clinician: Referring Physician: Glendon Axe Treating Physician/Extender: Frann Rider in Treatment: 64 Debridement Performed for Wound #6 Right,Medial Lower Leg Assessment: Performed By: Physician [name], MD Debridement: Debridement Pre-procedure Yes Verification/Time Out Taken: Start Time: 08:19 Pain Control: Other : lidocaine 4%  cream Level: Skin/Subcutaneous Tissue Total Area Debrided (L x 0.7 (cm) x 0.5 (cm) = 0.35 (cm) W): Tissue and other Viable, Non-Viable, Exudate, Fibrin/Slough, Subcutaneous material debrided: Instrument: Forceps Bleeding: Minimum Hemostasis Achieved: Pressure End Time: 08:22 Procedural Pain: 0 Post Procedural Pain: 0 Response to Treatment: Procedure was tolerated well Post Debridement Measurements of Total Wound Length: (cm) 0.7 Width: (cm) 0.5 Depth: (cm) 0.1 Volume: (cm) 0.027 Post Procedure Diagnosis Same as Pre-procedure Electronic Signature(s) Signed: 04/26/2016 4:11:02 PM By: [name], [name] Signed: 04/26/2016 4:51:59 PM By: Alric Quan Entered By: Alric Quan on 04/26/2016 08:29:07 [name], [name] ([id number]) -------------------------------------------------------------------------------- HPI Details Patient Name: [name], [name] Date of Service: 04/26/2016 8:00 AM Medical Record Number: [id number] Patient Account Number: [id number] Date of Birth/Sex: 08-12-1946 (70 y.o. Female) Treating RN: Carolyne Fiscal, Debi Primary Care Physician: Glendon Axe Other Clinician: Referring Physician: Glendon Axe Treating Physician/Extender: Frann Rider in Treatment: 20 History of Present Illness HPI Description: 70 year old patient who is known to have diabetes mellitus type 2, chronic renal insufficiency, coronary artery disease, hypertension, hypercholesterolemia, temporal arteritis and inflammatory arthritiss also has a history of having a hysterectomy and some orthopedic related surgeries. The ulcer on the left lower extremity started off as a blister and then. Got progressively worse. She does not have any fever or chills and has not had any recent surgical intervention for this. Her last hemoglobin A1c was 10.1 in September 2015. She has been recently put on doxycycline by her PCP. She is now also allergic to doxycycline and was this was changed over to  Keflex. 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 She does see a cardiologist and also takes a diuretic. 02/07/2015 her arterial and venous duplex studies to be done have dates been given as the first and third week of June. This is at  [facility]. We are going to try and get early appointments at [facility]. other than that nothing has changed in her management. 02/14/2015 -- we have been able to get her an appointment in [facility] on May 20 which is much earlier than her previous ones at [place]. She continues with her prednisone and her sugars are in the range of 150-200. 02/21/2015 We were able to get a vascular lab workup for her today and she is going to be there at 2:00 this afternoon. the swelling of her leg has gone down significantly but she still has some tenderness over the wounds. 02/28/2015 - She has had one of two vascular workups done, and this coming Tuesday has another, at [facility]. She continues to be on steroid medications. She has significant sensitivity in her left lower extremity and has pain suggestive of neuropathic pain and I have asked her to address this with her primary care physician. 03/07/2015 -- The patient saw Dr. Lucky Cowboy for a consultation and he has had her arteries are okay but she has 2 incompetent veins on the left lower extremity and he is going to set her up for surgery. Official report is awaited. Addendum: Official reports are now available and on 03/04/2015. She was seen and lower extremity venous duplex exam was done. There was reflux present within the left greater saphenous vein below the knee and also the left small saphenous vein. Arterial duplex showed normal triphasic waveforms throughout the left lower extremity without any significant stenosis. Her ABIs were noncompressible bilaterally but a waveforms  were normal and a digital pressures were normal bilaterally consistent with no significant arterial insufficiency. He has recommended endovenous ablation of both the left small saphenous and the left great saphenous vein. This would still be scheduled later. 03/14/2015 -- she has heard back from the vascular office and has surgery scheduled for sometime in July. Her rheumatologist has decreased her prednisone dosage but she still on it. She has also had cataract surgery in her right eye recently this week. [name], [name] ([id number]) 03/20/2015 - No new complaints today. Pain improved. No fever or chills. Tolerating 2 layer compression. 04/14/2015 -- she was doing very well today she went off on vacation and now her edema has increased markedly the ulceration is bigger and her diabetes is not under control. 04/21/2015 -- I spoke to her PCP Dr. Candiss Norse and discussed the management which would include being seen by a general surgeon for debridement and taking multiple punch biopsies which would help in establishing the diagnosis of this is a vasculitis. She is agreeable about this and will set her up for the procedure with Dr. Tamala Julian at [facility]. She was seen by the surgeon Dr. Jamal Collin. 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 She is scheduled to have laser ablation done next week.Pt was sent here for possible biopsy to look for vasculitis. Feel it would be better to wait after laser ablation is completed- the ulcer may heal fully and biopsy may not be necessary 04/29/2015 -- she had the venous ablation done by Dr. Lucky Cowboy last Friday and we do not have any notes yet. She is doing fine otherwise. 05/06/2015 --Review of her recent vascular intervention shows that she was seen by Dr. Lucky Cowboy on 04/29/2015. The follow-up duplex which was done showed that both the great saphenous vein and  the small saphenous vein remained patent with  reflux consistent with an unsuccessful ablation. He has rescheduled her for another the endovenous ablation to be done in about 4 weekso time. 05/13/2015 -- he was seen by her surgeon Dr. Jamal Collin who asked her to continue with conservative therapy and he would speak to Dr. Lucky Cowboy about her management. Dr. Lucky Cowboy is going to schedule her surgery in the middle of August for a repeat endovenous ablation. Her pus culture from last week has grown : [place] her noted her sensitivity report but due to her multiple allergies I had tried clindamycin and she developed a rash with this too. She has been prescribed and anti-buttocks in the ER and is has it at home and she will let is know what she is going to be taking. 05/20/2015 -- she has developed a small spot on her right lower extremity but besides that it is not a full fledged ulceration. She did not get to see Dr. Lucky Cowboy last week and hopefully she will see him in the near future. 05/27/2015 -she is still awaiting her appointment with Dr.Dew and her vascular procedure is not scheduled until August 19. She will be seeing her PCP tomorrow and I have asked her to convey our discussion so that she is aware that debridement has not been done yet. 06/03/2015 -- was seen by her rheumatologist Dr. 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 He is lowering her prednisone to 12-1/2 mg for 1 month and then 10 mg per the next month. I will again make an attempt to speak to her PCP Dr. Candiss Norse and her surgeon Dr. Lucky Cowboy to see if he [name] organize for a debridement in the OR with multiple biopsies to establish a diagnosis of vasculitis or pyoderma gangrenosum. 06/17/2015 -- Dr.Dew did her vascular procedure last week and a  follow-up venous ultrasound shows good resolution of the veins as per the patient's history. He is to see her back in 2 weeks. 07/07/2015. -- the patient has had a heavy growth of Proteus mirabilis and Enterococcus faecalis. These Wimbish, Serenah D. ([id number]) are sensitive to several drugs but the problem is she has allergies to all of these and hence I would like her to see Dr. Ola Spurr for this. She is also due to see Dr. Lucky Cowboy tomorrow and I will discussed the management with him including debridement under anesthesia and possible biopsies. 07/14/2015 -- she has an appointment to see Dr. Ola Spurr tomorrow and did see Dr. Bunnie Domino PA who will discuss my request with him. 07/21/2015 -- saw Dr. Ola Spurr was able to do a test on her and has put her on amoxicillin. She has been tolerating that and has had no problems with allergies to this. 07/28/2015 -- Last Friday I spoke to Dr. Leotis Pain regarding her care and he said that her right leg did not need any surgery and on the left leg was doing pretty good. We did agree that if she undergoes any procedure in the future he would do a couple of punch biopsies of the wound. 08/18/2015 -- her right leg is very tender and there is significant amount of slough. The left leg is looking much better 09/02/2015 -- she is going to have a debridement and punch biopsies of her right lower extremity by Dr. Leotis Pain this coming Thursday. Also seen Dr. Ola Spurr who has continued her on ciprofloxacin.  09/09/2015 -- on 09/04/2015 Dr. Leotis Pain took her to surgery - Irrigation and excisional debridement of skin, soft tissue, and muscle to about 40 cm2 to the right posterior calf with biopsy. Pathology results are -- DIAGNOSIS: SKIN, RIGHT LOWER EXTREMITY; BIOPSY: SKIN AND SOFT TISSUE WITH ULCERATION, SEE NOTE. - NEGATIVE FOR DYSPLASIA AND MALIGNANCY. Note: There is acute inflammation and ulceration of the epidermis. The dermis shows nonspecific inflammation,  neovascularization, and hemosiderin deposition. The differential diagnosis for these findings includes stasis dermatitis, nonspecific dermatitis, and infection. Correlation with clinical findings is required. A PAS fungal stain is obtained and results will be reported in an addendum. cultures were also sent and this grew Pseudomonas aeruginosa, Escherichia coli, Proteus mirabilis, Klebsiella pneumoniae and they were all sensitive to ciprofloxacin which she is on. 09/16/2015 -- he saw Dr. 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 He is going to call the patient in and discuss with her the management possibly with Imuran. I will continue treating her locally. 09/23/2015 -- she saw Dr. Ola Spurr today who was going to continue the antibiotics for now and stop after this course. She has an appointment to see Dr. Jefm Bryant in about a week's time. Her wound VAC has arrived but she did not bring it with her today. We will try and set her up for changes to the right leg 3 times a week. She is here for her first application of Apligraf to the left lower extremity. 09/30/2015 -- she has seen Dr. 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 because she is ambulatory at home health will not apply a wound VAC and she will have to come here 3 times a week on Monday Wednesday and Friday. 10/10/2015 -- she is here for a second application of Apligraf to her left lower extremity. 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 Jefm Bryant. Other than that she is doing well 10/31/2015 -- she is here for a third application of Apligraf to her left lower extremity. after reviewing her wound on the right side I noted that it is granulating extremely  well and we will use the remnants of the Apligraf on the right leg. 11/14/2015 -- she was recently admitted to the hospital between January 11 and 11/10/2015 for Fowle, Marybella D. ([id number]) uncontrolled hypertension, diabetes mellitus, acute kidney injury. during her admission she was supported by wound care and also by antibiotics. Around this time her immunosuppression was stopped by Dr. Nunzio Cory. She is feeling much better now. 11/21/2015 -- she has had her fourth application of Apligraf today and it has been shared between the left and the right leg 11/24/2015 -- was here for a wound VAC change but it was noticed that she had profuse bleeding from the lobe medial part of her right lower extremity and I was called in to evaluate her. 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 She has been doing fine since then 12/19/2015 -- she has finished 5 applications of Apligraf to the left lower extremity and this is doing great. He began with pain and swelling of her right ankle a couple of days ago and it has caused her much distress. She does not  have any fever or any change in her general health. 12/22/2015 -- the patient's pain and swelling of the right lower extremity has resolved over the weekend. The left lower extremity is completely healed. She has had the first application of Apligraf on the right leg today. 01/02/2016 -- She is here for the second application of Apligraf on the right leg today. 01/19/2016 -- she is here for the third application of Apligraf to the right posterior lateral leg. 02/02/2016 -- today she has had her fourth application of Apligraf and we have applied some of it to the right posterior lateral leg and the remaining waist we have used for the left anterior medial leg. 02/12/2016 -- today she is use the fifth application of Apligraf to the right posterior lateral leg. The  remaining to be wasted has been applied to the left anterior medial leg. 03/01/2016 -- her copayment for the Apligraf is about $300 apiece and she's been to talk to her family to see if she [name] afford it. 03/15/2016 -- she was able to get some charity care to help with her copayment and we have applied the first Apligraf to her right anterior medial leg. 03/29/2016 -- she is here for a second application to the right anterior medial leg. 04/05/2016 -- I had asked her to check with Dr. Jefm Bryant regarding her treatment with her immunosuppression for pyoderma gangrenosum and she has an appointment pending tomorrow. 04/12/2016 -- she saw Dr.Kernodle, at the clinic for rheumatology review. He recommended she resume Imuran at 75 and boost prednisone back to 5 mg temporarily. He also put her on Neurontin 100 mg 3 times daily. Electronic Signature(s) Signed: 04/26/2016 8:23:29 AM By: [name], [name] Entered By: [name] on 04/26/2016 08:23:29 [name], [name] ([id number]) -------------------------------------------------------------------------------- Physical Exam Details Patient Name: [name], [name] Date of Service: 04/26/2016 8:00 AM Medical Record Number: [id number] Patient Account Number: [id number] Date of Birth/Sex: [birth year] (70 y.o. Female) Treating RN: Carolyne Fiscal, Debi Primary Care Physician: Glendon Axe Other Clinician: Referring Physician: Glendon Axe Treating Physician/Extender: Frann Rider in Treatment: 64 Constitutional . Pulse regular. Respirations normal and unlabored. Afebrile. . Eyes Nonicteric. Reactive to light. Ears, Nose, Mouth, and Throat Lips, teeth, and gums WNL.Marland Kitchen Moist mucosa without lesions. Neck supple and nontender. No palpable supraclavicular or cervical adenopathy. Normal sized without goiter. Respiratory WNL. No retractions.. Cardiovascular Pedal Pulses WNL. No clubbing, cyanosis or edema. Lymphatic No adneopathy. No adenopathy. No  adenopathy. Musculoskeletal Adexa without tenderness or enlargement.. Digits and nails w/o clubbing, cyanosis, infection, petechiae, ischemia, or inflammatory conditions.. Integumentary (Hair, Skin) No suspicious lesions. No crepitus or fluctuance. No peri-wound warmth or erythema. No masses.Marland Kitchen Psychiatric Judgement and insight Intact.. No evidence of depression, anxiety, or agitation.. Notes in relation tissue and has supple scar. Exudate and eschar was removed with a toothed forcep and there is good healing skin under this. Electronic Signature(s) Signed: 04/26/2016 8:25:26 AM By: [name], [name] Previous Signature: 04/26/2016 8:25:13 AM Version By: [name], [name] Entered By: [name] on 04/26/2016 08:25:26 Moeser, [name] ([id number]) -------------------------------------------------------------------------------- Physician Orders Details Patient Name: NANAKO, KINNEE D. Date of Service: 04/26/2016 8:00 AM Medical Record Number: [id number] Patient Account Number: [id number] Date of Birth/Sex: [birth year] (70 y.o. 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 Anesthetic Wound #6 Right,Medial Lower Leg o Topical Lidocaine 4% cream applied to wound bed prior to debridement Primary Wound Dressing Wound  #6 Right,Medial Lower Leg o Other: - RTD Secondary Dressing Wound #6 Right,Medial Lower Leg o Gauze and Kerlix/Conform Dressing Change Frequency Wound #6 Right,Medial Lower Leg o Change dressing every other day. Follow-up Appointments Wound #6 Right,Medial Lower Leg o Return Appointment in 1 week. Edema Control Wound #6 Right,Medial Lower Leg [name], [name] ([id number]) o Elevate legs to the level of the heart and pump ankles as often as possible o Other: - compression hose Electronic Signature(s) Signed: 04/26/2016 4:11:02 PM By: [name], [name] Signed: 04/26/2016 4:51:59 PM By: Alric Quan Entered By: Alric Quan on 04/26/2016 08:29:28 [name], [name] ([id number]) -------------------------------------------------------------------------------- Problem List Details Patient Name: [name], [name] Date of Service: 04/26/2016 8:00 AM Medical Record Number: [id number] Patient Account Number: [id number] Date of Birth/Sex: February 12, 1946 (70 y.o. Female) Treating RN: Carolyne Fiscal, Debi Primary Care Physician: Glendon Axe Other Clinician: Referring Physician: Glendon Axe Treating Physician/Extender: Frann Rider in Treatment: 38 Active Problems ICD-10 Encounter Code Description Active Date Diagnosis E11.622 Type 2 diabetes mellitus with other skin ulcer 01/31/2015 Yes E66.01 Morbid (severe) obesity due to excess calories 01/31/2015 Yes I89.0 Lymphedema, not elsewhere classified 01/31/2015 Yes L97.212 Non-pressure chronic ulcer of right calf with fat layer 11/24/2015 Yes exposed I87.311 Chronic venous hypertension (idiopathic) with ulcer of 11/24/2015 Yes right lower extremity Inactive Problems Resolved Problems ICD-10 Code Description Active Date Resolved Date L97.322 Non-pressure chronic ulcer of left ankle with fat layer 01/31/2015 01/31/2015 exposed [id number] Varicose veins of left lower extremity with both ulcer of 03/07/2015 03/07/2015 calf and  inflammation I83.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 with both ulcer of 03/07/2015 03/07/2015 ankle and inflammation CELESTINA, BLAKESLEY ([id number]) L03.116 Cellulitis of left lower limb 07/07/2015 07/07/2015 Electronic Signature(s) Signed: 04/26/2016 8:23:13 AM By: [name], [name] Entered By: [name] on 04/26/2016 08:23:12 [place], Danese D. ([id number]) -------------------------------------------------------------------------------- Progress Note Details Patient Name: [name], Marry D. Date of Service: 04/26/2016 8:00 AM Medical Record Number: [id number] Patient Account Number: [id number] Date of Birth/Sex: 09/29/1946 (70 y.o. Female) Treating RN: Carolyne Fiscal, Debi Primary Care Physician: Glendon Axe Other Clinician: Referring Physician: Glendon Axe Treating Physician/Extender: Frann Rider in Treatment: 1 Subjective Chief Complaint Information obtained from Patient Patient presents to the wound care center for a consult due non healing wound 70 year old patient comes with a history of having a ulcer on the left lower extremity for the past 4 weeks. she says she's had swelling of both lower extremities for about a year after she started having prednisone. 02/07/2015 -- her vascular appointments obtained were in the first and third week of June. she is able to go to [place] and we will try and get her some earlier appointments. Other than that nothing else has changed in her management. History of Present Illness (HPI) 70 year old patient who is known to have diabetes mellitus type 2, chronic renal insufficiency, coronary artery disease, hypertension, hypercholesterolemia, temporal arteritis and inflammatory arthritiss also has a history of having a hysterectomy and some orthopedic related surgeries. The ulcer on the left lower extremity started off as a blister  and then. Got progressively worse. She does not have any fever or chills and has not had any recent  surgical intervention for this. Her last hemoglobin A1c was 10.1 in September 2015. She has been recently put on doxycycline by her PCP. She is now also allergic to doxycycline and was this was changed over to Keflex. 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 She does see a cardiologist and also takes a diuretic. 02/07/2015 her arterial and venous duplex studies to be done have dates been given as the first and third week of June. This is at [facility]. We are going to try and get early appointments at [name]. other than that nothing has changed in her management. 02/14/2015 -- we have been able to get her an appointment in [facility] on May 20 which is much earlier than her previous ones at [facility]. She continues with her prednisone and her sugars are in the range of 150-200. 02/21/2015 We were able to get a vascular lab workup for her today and she is going to be there at 2:00 this afternoon. the swelling of her leg has gone down significantly but she still has some tenderness over the wounds. 02/28/2015 - She has had one of two vascular workups done, and this coming Tuesday has another, at [facility]. She continues to be on steroid medications. She has significant sensitivity in her left lower extremity and has pain suggestive of neuropathic pain and I have asked her to address this with her primary care physician. 03/07/2015 -- The patient saw Dr. Lucky Cowboy for a consultation and he has had her arteries are okay but she has 2 incompetent veins on the left lower extremity and he is going to set her up for surgery. Official report is awaited. Addendum: Official reports are now available and on 03/04/2015. She was seen and lower extremity Patteson, Abrielle D. ([id number]) venous duplex exam was done. There was reflux present within the left greater saphenous  vein below the knee and also the left small saphenous vein. Arterial duplex showed normal triphasic waveforms throughout the left lower extremity without any significant stenosis. Her ABIs were noncompressible bilaterally but a waveforms were normal and a digital pressures were normal bilaterally consistent with no significant arterial insufficiency. He has recommended endovenous ablation of both the left small saphenous and the left great saphenous vein. This would still be scheduled later. 03/14/2015 -- she has heard back from the vascular office and has surgery scheduled for sometime in July. Her rheumatologist has decreased her prednisone dosage but she still on it. She has also had cataract surgery in her right eye recently this week. 03/20/2015 - No new complaints today. Pain improved. No fever or chills. Tolerating 2 layer compression. 04/14/2015 -- she was doing very well today she went off on vacation and now her edema has increased markedly the ulceration is bigger and her diabetes is not under control. 04/21/2015 -- I spoke to her PCP Dr. Candiss Norse and discussed the management which would include being seen by a general surgeon for debridement and taking multiple punch biopsies which would help in establishing the diagnosis of this is a vasculitis. She is agreeable about this and will set her up for the procedure with Dr. Tamala Julian at [facility]. She was seen by the surgeon Dr. Jamal Collin. 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 She is scheduled to have laser ablation done next week.Pt was sent here for possible biopsy to look for vasculitis. Feel it would be better to wait after laser ablation is completed- the ulcer may heal fully and biopsy may not be necessary 04/29/2015 -- she had the venous ablation done by Dr. Lucky Cowboy last Friday and we do not have any notes yet. She is  doing fine otherwise. 05/06/2015 --Review of her recent vascular intervention shows that she was seen by Dr. Lucky Cowboy on 04/29/2015. The follow-up duplex which was done showed that both the great saphenous vein and the small saphenous vein remained patent with reflux consistent with an unsuccessful ablation. He has rescheduled her for another the endovenous ablation to be done in about 4 weeks time. 05/13/2015 -- he was seen by her surgeon Dr. Jamal Collin who asked her to continue with conservative therapy and he would speak to Dr. Lucky Cowboy about her management. Dr. Lucky Cowboy is going to schedule her surgery in the middle of August for a repeat endovenous ablation. Her pus culture from last week has grown : Clayton her noted her sensitivity report but due to her multiple allergies I had tried clindamycin and she developed a rash with this too. She has been prescribed and anti-buttocks in the ER and is has it at home and she will let is know what she is going to be taking. 05/20/2015 -- she has developed a small spot on her right lower extremity but besides that it is not a full fledged ulceration. She did not get to see Dr. Lucky Cowboy last week and hopefully she will see him in the near future. 05/27/2015 -she is still awaiting her appointment with Dr.Dew and her vascular procedure is not scheduled until August 19. She will be seeing her PCP tomorrow and I have asked her to convey our discussion so that she is aware that debridement has not been done yet. 06/03/2015 -- was seen by her rheumatologist Dr. 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 He is lowering her prednisone to 12-1/2 mg for 1 month and then 10 mg per the next month. I will again make an attempt to speak to her PCP  Dr. Candiss Norse and her surgeon Dr. Lucky Cowboy to see if he [name] organize for a debridement in the OR with multiple biopsies to establish a diagnosis of vasculitis or pyoderma gangrenosum. 06/17/2015 -- Dr.Dew did her vascular procedure last week and a follow-up venous ultrasound shows good resolution of the veins as per the patient's history. He is to see her back in 2 weeks. 07/07/2015. -- the patient has had a heavy growth of Proteus mirabilis and Enterococcus faecalis. These are sensitive to several drugs but the problem is she has allergies to all of these and hence I would like her to see Dr. Ola Spurr for this. She is also due to see Dr. Lucky Cowboy tomorrow and I will discussed the management with him including debridement under anesthesia and possible biopsies. 07/14/2015 -- she has an appointment to see Dr. Ola Spurr tomorrow and did see Dr. Bunnie Domino PA who will discuss my request with him. 07/21/2015 -- saw Dr. Ola Spurr was able to do a test on her and has put her on amoxicillin. She has been tolerating that and  has had no problems with allergies to this. 07/28/2015 -- Last Friday I spoke to Dr. Leotis Pain regarding her care and he said that her right leg did not need any surgery and on the left leg was doing pretty good. We did agree that if she undergoes any procedure in the future he would do a couple of punch biopsies of the wound. 08/18/2015 -- her right leg is very tender and there is significant amount of slough. The left leg is looking much better 09/02/2015 -- she is going to have a debridement and punch biopsies of her right lower extremity by Dr. Leotis Pain this coming Thursday. Also seen Dr. Ola Spurr who has continued her on ciprofloxacin. 09/09/2015 -- on 09/04/2015 Dr. Leotis Pain took her to surgery - Irrigation and excisional debridement of skin, soft tissue, and muscle to about 40 cm2 to the right posterior calf with biopsy. Pathology results are -- DIAGNOSIS: SKIN, RIGHT LOWER EXTREMITY;  BIOPSY: SKIN AND SOFT TISSUE WITH ULCERATION, SEE NOTE. - NEGATIVE FOR DYSPLASIA AND MALIGNANCY. Note: There is acute inflammation and ulceration of the epidermis. The dermis shows nonspecific inflammation, neovascularization, and hemosiderin deposition. The differential diagnosis for these findings includes stasis dermatitis, nonspecific dermatitis, and infection. Correlation with clinical findings is required. A PAS fungal stain is obtained and results will be reported in an addendum. cultures were also sent and this grew Pseudomonas aeruginosa, Escherichia coli, Proteus mirabilis, Klebsiella pneumoniae and they were all sensitive to ciprofloxacin which she is on. 09/16/2015 -- he saw Dr. 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 He is going to call the patient in and discuss with her the management possibly with Imuran. I will continue treating her locally. 09/23/2015 -- she saw Dr. Ola Spurr today who was going to continue the antibiotics for now and stop after this course. She has an appointment to see Dr. Jefm Bryant in about a week's time. Her wound VAC has arrived but she did not bring it with her today. We will try and set her up for changes to the right leg 3 times a week. She is here for her first application of Apligraf to the left lower extremity. 09/30/2015 -- she has seen Dr. 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 because she is ambulatory at home health [facility], CHARNELLE ALBERTI. ([id number]) will not apply a wound VAC and she will have to come here 3 times a week on Monday Wednesday and Friday. 10/10/2015 -- she is here for a second application of Apligraf to her left lower extremity. 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 Jefm Bryant. Other than that she is doing well 10/31/2015 -- she is here for a third application of Apligraf to her left lower extremity. after reviewing her wound on the right side I noted that it is granulating extremely well and we will use the remnants of the Apligraf on the right leg. 11/14/2015 -- she was recently admitted to the hospital between January 11 and 11/10/2015 for uncontrolled hypertension, diabetes mellitus, acute kidney injury. during her admission she was supported by wound care and also by antibiotics. Around this time her immunosuppression was stopped by Dr. Nunzio Cory. She is feeling much better now. 11/21/2015 -- she has had her fourth application of Apligraf today  and it has been shared between the left and the right leg 11/24/2015 -- was here for a wound VAC change but it was noticed that she had profuse bleeding from the lobe medial part of her right lower extremity and I was called in to evaluate her. 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 She has been doing fine since then 12/19/2015 -- she has finished 5 applications of Apligraf to the left lower extremity and this is doing great. He began with pain and swelling of her right ankle a couple of days ago and it has caused her much distress. She does not have any fever or any change in her general health. 12/22/2015 -- the patient's pain and swelling of the right lower extremity has resolved over the weekend. The left lower extremity is completely healed. She has had the first application of Apligraf on the right leg today. 01/02/2016 -- She is here for the second application of Apligraf on the right leg today. 01/19/2016 -- she is here for the third application of Apligraf to the right posterior lateral leg. 02/02/2016 -- today she has had her fourth application of Apligraf and we have applied some of it to  the right posterior lateral leg and the remaining waist we have used for the left anterior medial leg. 02/12/2016 -- today she is use the fifth application of Apligraf to the right posterior lateral leg. The remaining to be wasted has been applied to the left anterior medial leg. 03/01/2016 -- her copayment for the Apligraf is about $300 apiece and she's been to talk to her family to see if she [name] afford it. 03/15/2016 -- she was able to get some charity care to help with her copayment and we have applied the first Apligraf to her right anterior medial leg. 03/29/2016 -- she is here for a second application to the right anterior medial leg. 04/05/2016 -- I had asked her to check with Dr. Jefm Bryant regarding her treatment with her immunosuppression for pyoderma gangrenosum and she has an appointment pending tomorrow. 04/12/2016 -- she saw Dr.Kernodle, at the clinic for rheumatology review. He recommended she resume Imuran at 75 and boost prednisone back to 5 mg temporarily. He also put her on Neurontin 100 mg 3 times daily. Objective Ingram, Alaija D. ([id number]) Constitutional Pulse regular. Respirations normal and unlabored. Afebrile. Vitals Time Taken: 8:06 AM, Height: 65 in, Weight: 248 lbs, BMI: 41.3, Temperature: 97.7 F, Pulse: 58 bpm, Respiratory Rate: 20 breaths/min, Blood Pressure: 160/61 mmHg. Eyes Nonicteric. Reactive to light. Ears, Nose, Mouth, and Throat Lips, teeth, and gums WNL.Marland Kitchen Moist mucosa without lesions. Neck supple and nontender. No palpable supraclavicular or cervical adenopathy. Normal sized without goiter. Respiratory WNL. No retractions.. Cardiovascular Pedal Pulses WNL. No clubbing, cyanosis or edema. Lymphatic No adneopathy. No adenopathy. No adenopathy. Musculoskeletal Adexa without tenderness or enlargement.. Digits and nails w/o clubbing, cyanosis, infection, petechiae, ischemia, or inflammatory conditions.Marland Kitchen Psychiatric Judgement and insight Intact..  No evidence of depression, anxiety, or agitation.. General Notes: in relation tissue and has supple scar. Exudate and eschar was removed with a toothed forcep and there is good healing skin under this. Integumentary (Hair, Skin) No suspicious lesions. No crepitus or fluctuance. No peri-wound warmth or erythema. No masses.. Wound #6 status is Open. Original cause of wound was Gradually Appeared. The wound is located on the Right,Medial Lower Leg. The wound measures 0.7cm length x 0.5cm width x  0.1cm depth; 0.275cm^2 area and 0.027cm^3 volume. The wound is limited to skin breakdown. There is no tunneling or undermining noted. There is a large amount of serous drainage noted. The wound margin is flat and intact. There is large (67-100%) red granulation within the wound bed. There is a small (1-33%) amount of necrotic tissue within the wound bed including Adherent Slough. The periwound skin appearance exhibited: Moist. The periwound skin appearance did not exhibit: Callus, Crepitus, Excoriation, Fluctuance, Friable, Induration, Localized Edema, Rash, Scarring, Dry/Scaly, Maceration, Atrophie Blanche, Cyanosis, Ecchymosis, Hemosiderin Staining, Mottled, Pallor, Rubor, Erythema. Periwound temperature was noted as No Abnormality. The periwound has tenderness on palpation. 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 There was a Skin/Subcutaneous Tissue Debridement [id number]) debridement with total area of 0.35 sq cm performed by [name], MD.  with the following instrument(s): Forceps to remove Viable and Non-Viable tissue/material including Exudate, Fibrin/Slough, and Subcutaneous after achieving pain control using Other (lidocaine 4% cream). A time out was conducted prior to the start of the procedure. A Minimum amount of bleeding was controlled with Pressure. The procedure was tolerated well with a pain level of 0 throughout and a pain level of 0 following the procedure. Post Debridement Measurements: 0.7cm length x 0.5cm width x 0.1cm depth; 0.027cm^3 volume. Post procedure Diagnosis Wound #6: Same as Pre-Procedure Plan Wound Cleansing: Wound #6 Right,Medial Lower Leg: Clean wound with Normal Saline. Anesthetic: Wound #6 Right,Medial Lower Leg: Topical Lidocaine 4% cream applied to wound bed prior to debridement Primary Wound Dressing: Wound #6 Right,Medial Lower Leg: Other: - RTD Secondary Dressing: Wound #6 Right,Medial Lower Leg: Schorsch, Ridhima D. ([id number]) Gauze and Kerlix/Conform Dressing Change Frequency: Wound #6 Right,Medial Lower Leg: Change dressing every other day. Follow-up Appointments: Wound #6 Right,Medial Lower Leg: Return Appointment in 1 week. Edema Control: Wound #6 Right,Medial Lower Leg: Elevate legs to the level of the heart and pump ankles as often as possible Other: - compression hose I recommended RTD and local compression to be carried out with elevation and exercise. She will see her back next week Electronic Signature(s) Signed: 04/26/2016 4:08:55 PM By: [name], [name] Previous Signature: 04/26/2016 4:08:15 PM Version By: [name], [name] Previous Signature: 04/26/2016 8:26:13 AM Version By: [name], [name] Entered By: [name] on 04/26/2016 16:08:55 Forsee, Zaylee D. ([id number]) -------------------------------------------------------------------------------- SuperBill Details Patient Name: KRISTOL, [name] Date of Service: 04/26/2016 Medical Record Number:  [id number] Patient Account Number: [id number] Date of Birth/Sex: [birth year] (70 y.o. 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

## 2016-04-30 ENCOUNTER — Ambulatory Visit: Payer: PPO

## 2016-05-03 ENCOUNTER — Encounter: Payer: Commercial Managed Care - HMO | Admitting: General Surgery

## 2016-05-03 DIAGNOSIS — E11622 Type 2 diabetes mellitus with other skin ulcer: Secondary | ICD-10-CM | POA: Diagnosis not present

## 2016-05-03 DIAGNOSIS — I83022 Varicose veins of left lower extremity with ulcer of calf: Secondary | ICD-10-CM

## 2016-05-03 DIAGNOSIS — L97229 Non-pressure chronic ulcer of left calf with unspecified severity: Principal | ICD-10-CM

## 2016-05-03 LAB — GLUCOSE, CAPILLARY
GLUCOSE-CAPILLARY: 54 mg/dL — AB (ref 65–99)
GLUCOSE-CAPILLARY: 55 mg/dL — AB (ref 65–99)
Glucose-Capillary: 45 mg/dL — ABNORMAL LOW (ref 65–99)
Glucose-Capillary: 43 mg/dL — CL (ref 65–99)

## 2016-05-03 NOTE — Progress Notes (Signed)
Venous ulcers healed.

## 2016-05-04 NOTE — Progress Notes (Signed)
[name] ([id number]) Visit Report for 05/03/2016 Chief Complaint Document Details Patient Name: [name], [name] 05/03/2016 3:45 Date of Service: PM Medical Record [id number] Number: Patient Account Number: [id number] 04/29/1946 (70 y.o. Treating RN: Ahmed Prima Date of Birth/Sex: Female) Other Clinician: Primary Care Physician: [facility], Hyman Hopes, Jonathyn Carothers Referring Physician: Glendon Axe Physician/Extender: Weeks in Treatment: 62 Information Obtained from: Patient Chief Complaint Patient presents to the wound care center for a consult due non healing wound 70 year old patient comes with a history of having a ulcer on the left lower extremity for the past 4 weeks. she says she's had swelling of both lower extremities for about a year after she started having prednisone. 02/07/2015 -- her vascular appointments obtained were in the first and third week of June. she is able to go to [place] and we will try and get her some earlier appointments. Other than that nothing else has changed in her management. Electronic Signature(s) Signed: 05/04/2016 12:00:24 PM By: Judene Companion MD Entered By: Judene Companion on 05/04/2016 12:00:23 [name], [name] ([id number]) -------------------------------------------------------------------------------- HPI Details Patient Name: [name] 05/03/2016 3:45 Date of Service: PM Medical Record [id number] Number: Patient Account Number: [id number] [birth year] (70 y.o. Treating RN: Ahmed Prima Date of Birth/Sex: Female) Other Clinician: Primary Care Physician: Danny Lawless, Lowell Makara Referring Physician: Glendon Axe Physician/Extender: Weeks in Treatment: 52 History of Present Illness HPI Description: 70 year old patient who is known to have diabetes mellitus type 2, chronic renal insufficiency, coronary artery disease, hypertension, hypercholesterolemia, temporal arteritis and inflammatory arthritiss  also has a history of having a hysterectomy and some orthopedic related surgeries. The ulcer on the left lower extremity started off as a blister and then. Got progressively worse. She does not have any fever or chills and has not had any recent surgical intervention for this. Her last hemoglobin A1c was 10.1 in September 2015. She has been recently put on doxycycline by her PCP. She is now also allergic to doxycycline and was this was changed over to Keflex. 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 She does see a cardiologist and also takes a diuretic. 02/07/2015 her arterial and venous duplex studies to be done have dates been given as the first and third week of June. This is at [facility]. We are going to try and get early appointments at [facility]. other than that nothing has changed in her management. 02/14/2015 -- we have been able to get her an appointment in [facility] on May 20 which is much earlier than her previous ones at [place]. She continues with her prednisone and her sugars are in the range of 150-200. 02/21/2015 We were able to get a vascular lab workup for her today and she is going to be there at 2:00 this afternoon. the swelling of her leg has gone down significantly but she still has some tenderness over the wounds. 02/28/2015 - She has had one of two vascular workups done, and this coming Tuesday has another, at [place] [facility]. She continues to be on steroid medications. She has significant sensitivity in her left lower extremity and has pain suggestive of neuropathic pain and I have asked her to address this with her primary care physician. 03/07/2015 -- The patient saw Dr. 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  Official report is awaited. Addendum: Official reports are now available and on 03/04/2015. She was seen and lower extremity venous duplex exam was done. There was reflux present within the left greater saphenous vein below the knee and also the left small saphenous vein. Arterial duplex showed normal triphasic waveforms throughout the left lower extremity without any significant stenosis. Her ABIs were noncompressible bilaterally but a waveforms were normal and a digital pressures were normal bilaterally consistent with no significant arterial insufficiency. He has recommended endovenous ablation of both the left small saphenous and the left great saphenous vein. This would still be scheduled later. 03/14/2015 -- she has heard back from the vascular office and has surgery scheduled for sometime in July. [name] ([id number]) Her rheumatologist has decreased her prednisone dosage but she still on it. She has also had cataract surgery in her right eye recently this week. 03/20/2015 - No new complaints today. Pain improved. No fever or chills. Tolerating 2 layer compression. 04/14/2015 -- she was doing very well today she went off on vacation and now her edema has increased markedly the ulceration is bigger and her diabetes is not under control. 04/21/2015 -- I spoke to her PCP Dr. Candiss Norse and discussed the management which would include being seen by a general surgeon for debridement and taking multiple punch biopsies which would help in establishing the diagnosis of this is a vasculitis. She is agreeable about this and will set her up for the procedure with Dr. Tamala Julian at [facility]. She was seen by the surgeon Dr. Jamal Collin. 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 She is scheduled to have laser ablation done next week.Pt was sent here for possible biopsy to look for  vasculitis. Feel it would be better to wait after laser ablation is completed- the ulcer may heal fully and biopsy may not be necessary 04/29/2015 -- she had the venous ablation done by Dr. Lucky Cowboy last Friday and we do not have any notes yet. She is doing fine otherwise. 05/06/2015 --Review of her recent vascular intervention shows that she was seen by Dr. Lucky Cowboy on 04/29/2015. The follow-up duplex which was done showed that both the great saphenous vein and the small saphenous vein remained patent with reflux consistent with an unsuccessful ablation. He has rescheduled her for another the endovenous ablation to be done in about 4 weekso time. 05/13/2015 -- he was seen by her surgeon Dr. Jamal Collin who asked her to continue with conservative therapy and he would speak to Dr. Lucky Cowboy about her management. Dr. Lucky Cowboy is going to schedule her surgery in the middle of August for a repeat endovenous ablation. Her pus culture from last week has grown : Storey her noted her sensitivity report but due to her multiple allergies I had tried clindamycin and she developed a rash with this too. She has been prescribed and anti-buttocks in the ER and is has it at home and she will let is know what she is going to be taking. 05/20/2015 -- she has developed a small spot on her right lower extremity but besides that it is not a full fledged ulceration. She did not get to see Dr. Lucky Cowboy last week and hopefully she will see him in the near future. 05/27/2015 -she is still awaiting her appointment  with Dr.Dew and her vascular procedure is not scheduled until August 19. She will be seeing her PCP tomorrow and I have asked her to convey our discussion so that she is aware that debridement has not been done yet. 06/03/2015 -- was seen by her rheumatologist Dr. Dorthula Matas, who has been treating her for temporal arteritis and  in his note has mentioned the possibility of vasculitis or pyoderma gangrenosum. He is lowering her prednisone to 12-1/2 mg for 1 month and then 10 mg per the next month. I will again make an attempt to speak to her PCP Dr. Candiss Norse and her surgeon Dr. 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 06/17/2015 -- Dr.Dew did her vascular procedure last week and a follow-up venous ultrasound shows good resolution of the veins as per the patient's history. He is to see her back in 2 weeks. [name], [name] ([id number]) 07/07/2015. -- the patient has had a heavy growth of Proteus mirabilis and Enterococcus faecalis. These are sensitive to several drugs but the problem is she has allergies to all of these and hence I would like her to see Dr. Ola Spurr for this. She is also due to see Dr. Lucky Cowboy tomorrow and I will discussed the management with him including debridement under anesthesia and possible biopsies. 07/14/2015 -- she has an appointment to see Dr. Ola Spurr tomorrow and did see Dr. Bunnie Domino PA who will discuss my request with him. 07/21/2015 -- saw Dr. Ola Spurr was able to do a test on her and has put her on amoxicillin. She has been tolerating that and has had no problems with allergies to this. 07/28/2015 -- Last Friday I spoke to Dr. Leotis Pain regarding her care and he said that her right leg did not need any surgery and on the left leg was doing pretty good. We did agree that if she undergoes any procedure in the future he would do a couple of punch biopsies of the wound. 08/18/2015 -- her right leg is very tender and there is significant amount of slough. The left leg is looking much better 09/02/2015 -- she is going to have a debridement and punch biopsies of her right lower extremity by Dr. Leotis Pain this coming Thursday. Also seen Dr. Ola Spurr who has continued her on  ciprofloxacin. 09/09/2015 -- on 09/04/2015 Dr. Leotis Pain took her to surgery - Irrigation and excisional debridement of skin, soft tissue, and muscle to about 40 cm2 to the right posterior calf with biopsy. Pathology results are -- DIAGNOSIS: SKIN, RIGHT LOWER EXTREMITY; BIOPSY: SKIN AND SOFT TISSUE WITH ULCERATION, SEE NOTE. - NEGATIVE FOR DYSPLASIA AND MALIGNANCY. Note: There is acute inflammation and ulceration of the epidermis. The dermis shows nonspecific inflammation, neovascularization, and hemosiderin deposition. The differential diagnosis for these findings includes stasis dermatitis, nonspecific dermatitis, and infection. Correlation with clinical findings is required. A PAS fungal stain is obtained and results will be reported in an addendum. cultures were also sent and this grew Pseudomonas aeruginosa, Escherichia coli, Proteus mirabilis, Klebsiella pneumoniae and they were all sensitive to ciprofloxacin which she is on. 09/16/2015 -- he saw Dr. 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 He is going to call the patient in and discuss with her the management possibly with Imuran. I will continue treating her  locally. 09/23/2015 -- she saw Dr. Ola Spurr today who was going to continue the antibiotics for now and stop after this course. She has an appointment to see Dr. Jefm Bryant in about a week's time. Her wound VAC has arrived but she did not bring it with her today. We will try and set her up for changes to the right leg 3 times a week. She is here for her first application of Apligraf to the left lower extremity. 09/30/2015 -- she has seen Dr. 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 because she is ambulatory at home health will not apply a wound VAC and she will have  to come here 3 times a week on Monday Wednesday and Friday. 10/10/2015 -- she is here for a second application of Apligraf to her left lower extremity. 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 Jefm Bryant. Other than that she is doing well 10/31/2015 -- she is here for a third application of Apligraf to her left lower extremity. after reviewing her wound on the right side I noted that it is granulating extremely well and we will use the remnants of the Apligraf on the right leg. [name], [name] ([id number]) 11/14/2015 -- she was recently admitted to the hospital between January 11 and 11/10/2015 for uncontrolled hypertension, diabetes mellitus, acute kidney injury. during her admission she was supported by wound care and also by antibiotics. Around this time her immunosuppression was stopped by Dr. Nunzio Cory. She is feeling much better now. 11/21/2015 -- she has had her fourth application of Apligraf today and it has been shared between the left and the right leg 11/24/2015 -- was here for a wound VAC change but it was noticed that she had profuse bleeding from the lobe medial part of her right lower extremity and I was called in to evaluate her. 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 She has been doing fine since then 12/19/2015 -- she has finished 5 applications of Apligraf to the left lower extremity and this is doing great. He began with pain and swelling of her right ankle a couple of days ago and it has caused her much distress. She does not have any fever or any change in her general health. 12/22/2015 -- the patient's pain and swelling of the right lower extremity has resolved over the weekend. The left lower extremity is completely healed. She has had the first application of Apligraf on the right leg today. 01/02/2016 -- She is  here for the second application of Apligraf on the right leg today. 01/19/2016 -- she is here for the third application of Apligraf to the right posterior lateral leg. 02/02/2016 -- today she has had her fourth application of Apligraf and we have applied some of it to the right posterior lateral leg and the remaining waist we have used for the left anterior medial leg. 02/12/2016 -- today she is use the fifth application of Apligraf to the right posterior lateral leg. The remaining to be wasted has been applied to the left anterior medial leg. 03/01/2016 -- her copayment for the Apligraf is about $300 apiece and she's been to talk to her family to see if she can afford it. 03/15/2016 -- she was able to get some charity care to help with her copayment  and we have applied the first Apligraf to her right anterior medial leg. 03/29/2016 -- she is here for a second application to the right anterior medial leg. 04/05/2016 -- I had asked her to check with Dr. Jefm Bryant regarding her treatment with her immunosuppression for pyoderma gangrenosum and she has an appointment pending tomorrow. 04/12/2016 -- she saw Dr.Kernodle, at the clinic for rheumatology review. He recommended she resume Imuran at 75 and boost prednisone back to 5 mg temporarily. He also put her on Neurontin 100 mg 3 times [name] Electronic Signature(s) Signed: 05/04/2016 12:00:44 PM By: Judene Companion MD Entered By: Judene Companion on 05/04/2016 12:00:43 [name], [name] ([id number]) -------------------------------------------------------------------------------- Physical Exam Details Patient Name: BRUCE, MARTINDALE 05/03/2016 3:45 Date of Service: PM Medical Record [id number] Number: Patient Account Number: [id number] 08-10-1946 (70 y.o. Treating RN: Ahmed Prima Date of Birth/Sex: Female) Other Clinician: Primary Care Physician: Danny Lawless, Beren Yniguez Referring Physician: Glendon Axe Physician/Extender: Weeks  in Treatment: 65 Electronic Signature(s) Signed: 05/04/2016 12:01:18 PM By: Judene Companion MD Entered By: Judene Companion on 05/04/2016 12:01:18 [name], [name] ([id number]) -------------------------------------------------------------------------------- Physician Orders Details Patient Name: AKYRA, BEAGAN D. 05/03/2016 3:45 Date of Service: PM Medical Record [id number] Number: Patient Account Number: [id number] 08-16-1946 (70 y.o. Treating RN: Ahmed Prima Date of Birth/Sex: Female) Other Clinician: Primary Care Physician: [facility], Hyman Hopes, Augustin Bun Referring Physician: Glendon Axe Physician/Extender: Weeks in Treatment: 100 Verbal / Phone Orders: Yes Clinician: Pinkerton, Debi Read Back and Verified: Yes Diagnosis Coding Wound Cleansing Wound #6 Right,Medial Lower Leg o Clean wound with Normal Saline. Secondary Dressing Wound #6 Right,Medial Lower Leg o Boardered Foam Dressing Dressing Change Frequency Wound #6 Right,Medial Lower Leg o Change dressing every other day. Follow-up Appointments Wound #6 Right,Medial Lower Leg o Return Appointment in 1 week. Edema Control Wound #6 Right,Medial Lower Leg o Elevate legs to the level of the heart and pump ankles as often as possible o Other: - compression hose Electronic Signature(s) Signed: 05/03/2016 5:17:01 PM By: Alric Quan Signed: 05/04/2016 1:16:39 PM By: Judene Companion MD Entered By: Alric Quan on 05/03/2016 16:11:17 Tun, Carina D. ([id number]) -------------------------------------------------------------------------------- Progress Note Details Patient Name: DEIA, MATCHETT D. 05/03/2016 3:45 Date of Service: PM Medical Record [id number] Number: Patient Account Number: [id number] Jun 22, 1946 (70 y.o. Treating RN: Ahmed Prima Date of Birth/Sex: Female) Other Clinician: Primary Care Physician: [facility], Hyman Hopes, Terrance Usery Referring Physician: Glendon Axe Physician/Extender: Weeks in Treatment: 48 Subjective Chief Complaint Information obtained from Patient Patient presents to the wound care center for a consult due non healing wound 70 year old patient comes with a history of having a ulcer on the left lower extremity for the past 4 weeks. she says she's had swelling of both lower extremities for about a year after she started having prednisone. 02/07/2015 -- her vascular appointments obtained were in the first and third week of June. she is able to go to [place] and we will try and get her some earlier appointments. Other than that nothing else has changed in her management. History of Present Illness (HPI) 70 year old patient who is known to have diabetes mellitus type 2, chronic renal insufficiency, coronary artery disease, hypertension, hypercholesterolemia, temporal arteritis and inflammatory arthritiss also has a history of having a hysterectomy and some orthopedic related surgeries. The ulcer on the left lower extremity started off as a blister and then. Got progressively worse. She does not have any fever or chills and has not had any recent surgical intervention for this. Her last hemoglobin A1c was  10.1 in September 2015. She has been recently put on doxycycline by her PCP. She is now also allergic to doxycycline and was this was changed over to Keflex. 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 She does see a cardiologist and also takes a diuretic. 02/07/2015 her arterial and venous duplex studies to be done have dates been given as the first and third week of June. This is at [facility]. We are going to try and get early appointments at [facility]. other than that nothing has changed in her management. 02/14/2015 -- we have been able to get her an appointment in [facility] on May 20 which is much  earlier than her previous ones at [place]. She continues with her prednisone and her sugars are in the range of 150-200. 02/21/2015 We were able to get a vascular lab workup for her today and she is going to be there at 2:00 this afternoon. the swelling of her leg has gone down significantly but she still has some tenderness over the wounds. 02/28/2015 - She has had one of two vascular workups done, and this coming Tuesday has another, at [place] [facility]. She continues to be on steroid medications. She has significant sensitivity in her left lower extremity and has pain suggestive of neuropathic pain and I have asked her to address this with her primary care physician. 03/07/2015 -- The patient saw Dr. Lucky Cowboy for a consultation and he has had her arteries are okay but she has 2 incompetent veins on the left lower extremity and he is going to set her up for surgery. Official report is IMO, ALKHATIB ([id number]) awaited. Addendum: Official reports are now available and on 03/04/2015. She was seen and lower extremity venous duplex exam was done. There was reflux present within the left greater saphenous vein below the knee and also the left small saphenous vein. Arterial duplex showed normal triphasic waveforms throughout the left lower extremity without any significant stenosis. Her ABIs were noncompressible bilaterally but a waveforms were normal and a digital pressures were normal bilaterally consistent with no significant arterial insufficiency. He has recommended endovenous ablation of both the left small saphenous and the left great saphenous vein. This would still be scheduled later. 03/14/2015 -- she has heard back from the vascular office and has surgery scheduled for sometime in July. Her rheumatologist has decreased her prednisone dosage but she still on it. She has also had cataract surgery in her right eye recently this week. 03/20/2015 - No new complaints today.  Pain improved. No fever or chills. Tolerating 2 layer compression. 04/14/2015 -- she was doing very well today she went off on vacation and now her edema has increased markedly the ulceration is bigger and her diabetes is not under control. 04/21/2015 -- I spoke to her PCP Dr. Candiss Norse and discussed the management which would include being seen by a general surgeon for debridement and taking multiple punch biopsies which would help in establishing the diagnosis of this is a vasculitis. She is agreeable about this and will set her up for the procedure with Dr. Tamala Julian at [facility]. She was seen by the surgeon Dr. Jamal Collin. 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 She is scheduled to have laser ablation done next week.Pt was sent here for possible  biopsy to look for vasculitis. Feel it would be better to wait after laser ablation is completed- the ulcer may heal fully and biopsy may not be necessary 04/29/2015 -- she had the venous ablation done by Dr. Lucky Cowboy last Friday and we do not have any notes yet. She is doing fine otherwise. 05/06/2015 --Review of her recent vascular intervention shows that she was seen by Dr. Lucky Cowboy on 04/29/2015. The follow-up duplex which was done showed that both the great saphenous vein and the small saphenous vein remained patent with reflux consistent with an unsuccessful ablation. He has rescheduled her for another the endovenous ablation to be done in about 4 weeks time. 05/13/2015 -- he was seen by her surgeon Dr. Jamal Collin who asked her to continue with conservative therapy and he would speak to Dr. Lucky Cowboy about her management. Dr. Lucky Cowboy is going to schedule her surgery in the middle of August for a repeat endovenous ablation. Her pus culture from last week has grown : Sparks her noted her sensitivity report but due to her multiple allergies I had tried clindamycin and she developed a rash with this too. She has been prescribed and anti-buttocks in the ER and is has it at home and she will let is know what she is going to be taking. 05/20/2015 -- she has developed a small spot on her right lower extremity but besides that it is not a full fledged ulceration. She did not get to see Dr. Lucky Cowboy last week and hopefully she will see him in the near future. 05/27/2015 -she is still awaiting her appointment with Dr.Dew and her vascular procedure is not scheduled until August 19. She will be seeing her PCP tomorrow and I have asked her to convey our discussion so that she is aware that debridement has not been done yet. [name], [name] ([id number]) 06/03/2015 -- was seen by her rheumatologist Dr. 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 He is lowering her prednisone to 12-1/2 mg for 1 month and then 10 mg per the next month. I will again make an attempt to speak to her PCP Dr. Candiss Norse and her surgeon Dr. 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 06/17/2015 -- Dr.Dew did her vascular procedure last week and a follow-up venous ultrasound shows good resolution of the veins as per the patient's history. He is to see her back in 2 weeks. 07/07/2015. -- the patient has had a heavy growth of Proteus mirabilis and Enterococcus faecalis. These are sensitive to several drugs but the problem is she has allergies to all of these and hence I would like her to see Dr. Ola Spurr for this. She is also due to see Dr. Lucky Cowboy tomorrow and I will discussed the management with him including debridement under anesthesia and possible biopsies. 07/14/2015 -- she has  an appointment to see Dr. Ola Spurr tomorrow and did see Dr. Bunnie Domino PA who will discuss my request with him. 07/21/2015 -- saw Dr. Ola Spurr was able to do a test on her and has put her on amoxicillin. She has been tolerating that and has had no problems with allergies to this. 07/28/2015 -- Last Friday I spoke to Dr. Leotis Pain regarding her care and he said that her right leg  did not need any surgery and on the left leg was doing pretty good. We did agree that if she undergoes any procedure in the future he would do a couple of punch biopsies of the wound. 08/18/2015 -- her right leg is very tender and there is significant amount of slough. The left leg is looking much better 09/02/2015 -- she is going to have a debridement and punch biopsies of her right lower extremity by Dr. Leotis Pain this coming Thursday. Also seen Dr. Ola Spurr who has continued her on ciprofloxacin. 09/09/2015 -- on 09/04/2015 Dr. Leotis Pain took her to surgery - Irrigation and excisional debridement of skin, soft tissue, and muscle to about 40 cm2 to the right posterior calf with biopsy. Pathology results are -- DIAGNOSIS: SKIN, RIGHT LOWER EXTREMITY; BIOPSY: SKIN AND SOFT TISSUE WITH ULCERATION, SEE NOTE. - NEGATIVE FOR DYSPLASIA AND MALIGNANCY. Note: There is acute inflammation and ulceration of the epidermis. The dermis shows nonspecific inflammation, neovascularization, and hemosiderin deposition. The differential diagnosis for these findings includes stasis dermatitis, nonspecific dermatitis, and infection. Correlation with clinical findings is required. A PAS fungal stain is obtained and results will be reported in an addendum. cultures were also sent and this grew Pseudomonas aeruginosa, Escherichia coli, Proteus mirabilis, Klebsiella pneumoniae and they were all sensitive to ciprofloxacin which she is on. 09/16/2015 -- he saw Dr. 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 He is going to call the patient in and discuss with her the management possibly with Imuran. I will continue treating her locally. 09/23/2015 -- she saw Dr. Ola Spurr today who was going to continue the antibiotics for now and stop after this course. She has an appointment to see Dr. Jefm Bryant in about a week's time. Her wound VAC has arrived but she did not bring it with her today. We will try and set her up for changes to the right leg 3 times a week. She is here for her first application of Apligraf to the left lower extremity. [name], [name] ([id number]) 09/30/2015 -- she has seen Dr. 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 because she is ambulatory at home health will not apply a wound VAC and she will have to come here 3 times a week on Monday Wednesday and Friday. 10/10/2015 -- she is here for a second application of Apligraf to her left lower extremity. 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 Jefm Bryant. Other than that she is doing well 10/31/2015 -- she is here for a third application of Apligraf to her left lower extremity. after reviewing her wound on the right side I noted that it is granulating extremely well and we will use the remnants of the Apligraf on the right leg. 11/14/2015 -- she was recently admitted to the hospital between January 11 and 11/10/2015 for uncontrolled hypertension, diabetes mellitus, acute kidney injury. during her admission she was supported by wound care and also by antibiotics. Around this time her immunosuppression was stopped by Dr. Nunzio Cory. She is feeling much better now. 11/21/2015 -- she has had her fourth application of Apligraf today and it has been shared between the left and the right leg 11/24/2015 -- was  here for a wound VAC change but it was noticed that she had  profuse bleeding from the lobe medial part of her right lower extremity and I was called in to evaluate her. 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 She has been doing fine since then 12/19/2015 -- she has finished 5 applications of Apligraf to the left lower extremity and this is doing great. He began with pain and swelling of her right ankle a couple of days ago and it has caused her much distress. She does not have any fever or any change in her general health. 12/22/2015 -- the patient's pain and swelling of the right lower extremity has resolved over the weekend. The left lower extremity is completely healed. She has had the first application of Apligraf on the right leg today. 01/02/2016 -- She is here for the second application of Apligraf on the right leg today. 01/19/2016 -- she is here for the third application of Apligraf to the right posterior lateral leg. 02/02/2016 -- today she has had her fourth application of Apligraf and we have applied some of it to the right posterior lateral leg and the remaining waist we have used for the left anterior medial leg. 02/12/2016 -- today she is use the fifth application of Apligraf to the right posterior lateral leg. The remaining to be wasted has been applied to the left anterior medial leg. 03/01/2016 -- her copayment for the Apligraf is about $300 apiece and she's been to talk to her family to see if she can afford it. 03/15/2016 -- she was able to get some charity care to help with her copayment and we have applied the first Apligraf to her right anterior medial leg. 03/29/2016 -- she is here for a second application to the right anterior medial leg. 04/05/2016 -- I had asked her to check with Dr. Jefm Bryant regarding her treatment with her immunosuppression for pyoderma gangrenosum and she  has an appointment pending tomorrow. 04/12/2016 -- she saw Dr.Kernodle, at the clinic for rheumatology review. He recommended she resume Imuran at 75 and boost prednisone back to 5 mg temporarily. He also put her on Neurontin 100 mg 3 times [name] Objective [name], [name] ([id number]) Constitutional Vitals Time Taken: 3:48 PM, Height: 65 in, Weight: 248 lbs, BMI: 41.3, Temperature: 97.6 F, Pulse: 62 bpm, Respiratory Rate: 20 breaths/min, Blood Pressure: 135/53 mmHg. Integumentary (Hair, Skin) Wound #6 status is Open. Original cause of wound was Gradually Appeared. The wound is located on the Right,Medial Lower Leg. The wound measures 0.1cm length x 0.1cm width x 0.1cm depth; 0.008cm^2 area and 0.001cm^3 volume. The wound is limited to skin breakdown. There is no tunneling or undermining noted. There is a small amount of serous drainage noted. The wound margin is flat and intact. There is large (67-100%) pink granulation within the wound bed. There is no necrotic tissue within the wound bed. The periwound skin appearance did not exhibit: Callus, Crepitus, Excoriation, Fluctuance, Friable, Induration, Localized Edema, Rash, Scarring, Dry/Scaly, Maceration, Moist, Atrophie Blanche, Cyanosis, Ecchymosis, Hemosiderin Staining, Mottled, Pallor, Rubor, Erythema. Periwound temperature was noted as No Abnormality. The periwound has tenderness on palpation. Plan Wound Cleansing: Wound #6 Right,Medial Lower Leg: Clean wound with Normal Saline. Secondary Dressing: Wound #6 Right,Medial Lower Leg: Boardered Foam Dressing Dressing Change Frequency: Wound #6 Right,Medial Lower Leg: Change dressing every other day. Follow-up Appointments: Wound #6 Right,Medial Lower Leg: Return Appointment in 1 week. Edema Control: Wound #6 Right,Medial Lower Leg: Elevate legs to the level  of the heart and pump ankles as often as possible Other: - compression hose Follow-Up Appointments: A follow-up  appointment should be scheduled. Medication Reconciliation completed and provided to Patient/Care Provider. A Patient Clinical Summary of Care was provided to [name], [name] ([id number]) Patient wound is almost closed. She will f/u next week for evaluation. Electronic Signature(s) Signed: 05/04/2016 12:01:53 PM By: Judene Companion MD Entered By: Judene Companion on 05/04/2016 12:01:53 [name], [name] ([id number]) -------------------------------------------------------------------------------- SuperBill Details Patient Name: [name], BEAULIEU D. Date of Service: 05/03/2016 Medical Record Number: [id number] Patient Account Number: [id number] Date of Birth/Sex: 03/22/1946 (70 y.o. 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 Facility Procedures CPT4 Code: [id number] Description: 99213 - WOUND CARE VISIT-LEV 3 EST PT Modifier: Quantity: 1 Physician Procedures CPT4 Code: [id number] Description: [id number] - WC PHYS LEVEL 3 - EST PT ICD-10 Description Diagnosis E11.622 Type 2 diabetes mellitus with other skin ulcer E66.01 Morbid (severe) obesity due to excess calories I89.0 Lymphedema, not elsewhere classified L97.212 Non-pressure  chronic ulcer of right calf with fat Modifier: layer exposed Quantity: 1 Electronic Signature(s) Signed: 05/04/2016 12:02:22 PM By: Judene Companion MD Previous Signature: 05/03/2016 5:17:01 PM Version By: Alric Quan Entered By: Judene Companion on 05/04/2016 12:02:22

## 2016-05-04 NOTE — Progress Notes (Addendum)
LUBY, WINTRODE ([id number]) Visit Report for 05/03/2016 Arrival Information Details Patient Name: [name], [name] Date of Service: 05/03/2016 3:45 PM Medical Record Number: [id number] Patient Account Number: [id number] Date of Birth/Sex: 06-10-1946 (70 y.o. Female) Treating RN: Carolyne Fiscal, Debi Primary Care Physician: Glendon Axe Other Clinician: Referring Physician: Glendon Axe Treating Physician/Extender: Benjaman Pott in Treatment: 36 Visit Information History Since Last Visit All ordered tests and consults were completed: No Patient Arrived: Ambulatory Added or deleted any medications: No Arrival Time: 15:47 Any new allergies or adverse reactions: No Accompanied By: self Had a fall or experienced change in No Transfer Assistance: None activities of daily living that may affect Patient Identification Verified: Yes risk of falls: Secondary Verification Process Yes Signs or symptoms of abuse/neglect since last No Completed: visito Patient Requires Transmission- No Hospitalized since last visit: No Based Precautions: Pain Present Now: No Patient Has Alerts: Yes Patient Alerts: Patient on Blood Thinner Electronic Signature(s) Signed: 05/03/2016 5:17:01 PM By: Alric Quan Entered By: Alric Quan on 05/03/2016 15:48:14 Thedford, [name] ([id number]) -------------------------------------------------------------------------------- Clinic Level of Care Assessment Details Patient Name: [name] Date of Service: 05/03/2016 3:45 PM Medical Record Number: [id number] Patient Account Number: [id number] Date of Birth/Sex: 1946-07-31 (70 y.o. Female) Treating RN: Carolyne Fiscal, Debi Primary Care Physician: Glendon Axe Other Clinician: Referring Physician: Glendon Axe Treating Physician/Extender: Benjaman Pott in Treatment: 70 Clinic Level of Care Assessment Items TOOL 4 Quantity Score X - Use when only an EandM is performed on FOLLOW-UP visit 1  0 ASSESSMENTS - Nursing Assessment / Reassessment X - Reassessment of Co-morbidities (includes updates in patient status) 1 10 X - Reassessment of Adherence to Treatment Plan 1 5 ASSESSMENTS - Wound and Skin Assessment / Reassessment X - Simple Wound Assessment / Reassessment - one wound 1 5 []  - Complex Wound Assessment / Reassessment - multiple wounds 0 []  - Dermatologic / Skin Assessment (not related to wound area) 0 ASSESSMENTS - Focused Assessment []  - Circumferential Edema Measurements - multi extremities 0 []  - Nutritional Assessment / Counseling / Intervention 0 []  - Lower Extremity Assessment (monofilament, tuning fork, pulses) 0 []  - Peripheral Arterial Disease Assessment (using hand held doppler) 0 ASSESSMENTS - Ostomy and/or Continence Assessment and Care []  - Incontinence Assessment and Management 0 []  - Ostomy Care Assessment and Management (repouching, etc.) 0 PROCESS - Coordination of Care X - Simple Patient / Family Education for ongoing care 1 15 []  - Complex (extensive) Patient / Family Education for ongoing care 0 []  - Staff obtains Programmer, systems, Records, Test Results / Process Orders 0 []  - Staff telephones HHA, Nursing Homes / Clarify orders / etc 0 []  - Routine Transfer to another Facility (non-emergent condition) 0 Stineman, [name] ([id number]) []  - Routine Hospital Admission (non-emergent condition) 0 []  - New Admissions / Biomedical engineer / Ordering NPWT, Apligraf, etc. 0 []  - Emergency Hospital Admission (emergent condition) 0 X - Simple Discharge Coordination 1 10 []  - Complex (extensive) Discharge Coordination 0 PROCESS - Special Needs []  - Pediatric / Minor Patient Management 0 []  - Isolation Patient Management 0 []  - Hearing / Language / Visual special needs 0 []  - Assessment of Community assistance (transportation, [name], etc.) 0 []  - Additional assistance / Altered mentation 0 []  - Support Surface(s) Assessment (bed, cushion, seat, etc.)  0 INTERVENTIONS - Wound Cleansing / Measurement X - Simple Wound Cleansing - one wound 1 5 []  - Complex Wound Cleansing - multiple wounds 0 X - Wound Imaging (photographs -  any number of wounds) 1 5 []  - Wound Tracing (instead of photographs) 0 X - Simple Wound Measurement - one wound 1 5 []  - Complex Wound Measurement - multiple wounds 0 INTERVENTIONS - Wound Dressings X - Small Wound Dressing one or multiple wounds 1 10 []  - Medium Wound Dressing one or multiple wounds 0 []  - Large Wound Dressing one or multiple wounds 0 X - Application of Medications - topical 1 5 []  - Application of Medications - injection 0 INTERVENTIONS - Miscellaneous []  - External ear exam 0 Hanlon, [name] ([id number]) []  - Specimen Collection (cultures, biopsies, blood, body fluids, etc.) 0 []  - Specimen(s) / Culture(s) sent or taken to Lab for analysis 0 []  - Patient Transfer (multiple staff / [name]) 0 []  - Simple Staple / Suture removal (25 or less) 0 []  - Complex Staple / Suture removal (26 or more) 0 []  - Hypo / Hyperglycemic Management (close monitor of Blood Glucose) 0 []  - Ankle / Brachial Index (ABI) - do not check if billed separately 0 X - Vital Signs 1 5 Has the patient been seen at the hospital within the last three years: Yes Total Score: 80 Level Of Care: New/Established - Level 3 Electronic Signature(s) Signed: 05/03/2016 5:17:01 PM By: Alric Quan Entered By: Alric Quan on 05/03/2016 16:42:53 Hice, [name] ([id number]) -------------------------------------------------------------------------------- Encounter Discharge Information Details Patient Name: [name] Date of Service: 05/03/2016 3:45 PM Medical Record Number: [id number] Patient Account Number: [id number] Date of Birth/Sex: [birth year] (70 y.o. Female) Treating RN: Carolyne Fiscal, Debi Primary Care Physician: Glendon Axe Other Clinician: Referring Physician: Glendon Axe Treating  Physician/Extender: Benjaman Pott in Treatment: 19 Encounter Discharge Information Items Discharge Pain Level: 0 Discharge Condition: Stable Ambulatory Status: Ambulatory Discharge Destination: Home Transportation: Private Auto Accompanied By: self Schedule Follow-up Appointment: Yes Medication Reconciliation completed and provided to Patient/Care Yes Beata Beason: Provided on Clinical Summary of Care: 05/03/2016 Form Type Recipient Paper Patient LM Electronic Signature(s) Signed: 05/03/2016 5:08:37 PM By: Ruthine Dose Entered By: Ruthine Dose on 05/03/2016 17:08:37 Barhorst, Karisha [name] ([id number]) -------------------------------------------------------------------------------- General Visit Notes Details Patient Name: [name] Date of Service: 05/03/2016 3:45 PM Medical Record Number: [id number] Patient Account Number: [id number] Date of Birth/Sex: June 10, 1946 (70 y.o. Female) Treating RN: Carolyne Fiscal, Debi Primary Care Physician: Glendon Axe Other Clinician: Referring Physician: Glendon Axe Treating Physician/Extender: Benjaman Pott in Treatment: 65 Notes Pt was about to leave and she states that she felt like her blood sugar was low. She said she ate lunch but she thinks that she didn't eat enough. Sallie the HBO tech got orders from Dr. Jerline Pain to check pt's blood sugar and it was 45. We gave the pt orange juice, peanut butter crackers and Ensure. We rechecked her BS 15 mins later and it was 43. Sallie talked to Dr. Jerline Pain and we tried to check her BS again 15 mins later and it was 54. Sallie again talked to Dr. Jerline Pain again and we gave the pt a regular Coca-Cola. We rechecked her BS 20 mins later and it was 55. Sallie spoke to Dr. Jerline Pain again and pt states that she feels fine and she just wants to leave and go to eat at [employer] across the street. She stated she felt better. Dr. Jerline Pain stated that she could go if she felt better. Sallie did walk out with pt and  pt is going to go straight to eat. Electronic Signature(s) Signed: 05/03/2016 5:09:50 PM By: Alric Quan Entered By:  Alejandro Mulling on 05/03/2016 17:09:50 Connole, [name] Kitchen ([id number]) -------------------------------------------------------------------------------- Lower Extremity Assessment Details Patient Name: ROSIELEE, CORPORAN. Date of Service: 05/03/2016 3:45 PM Medical Record Number: [id number] Patient Account Number: [id number] Date of Birth/Sex: 26-Jan-1946 (70 y.o. Female) Treating RN: Ashok Cordia, Debi Primary Care Physician: Leotis Shames Other Clinician: Referring Physician: Leotis Shames Treating Physician/Extender: Elayne Snare in Treatment: 65 Vascular Assessment Pulses: Posterior Tibial Dorsalis Pedis Palpable: [Right:Yes] Extremity colors, hair growth, and conditions: Extremity Color: [Right:Mottled] Temperature of Extremity: [Right:Warm] Capillary Refill: [Right:< 3 seconds] Toe Nail Assessment Left: Right: Thick: No Discolored: No Deformed: No Improper Length and Hygiene: No Electronic Signature(s) Signed: 05/03/2016 5:17:01 PM By: Alejandro Mulling Entered By: Alejandro Mulling on 05/03/2016 15:49:49 Beaulieu, [name] ([id number]) -------------------------------------------------------------------------------- Multi Wound Chart Details Patient Name: Kristin Mclean [name] Date of Service: 05/03/2016 3:45 PM Medical Record Number: [id number] Patient Account Number: [id number] Date of Birth/Sex: June 26, 1946 (70 y.o. 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] [N/A:N/A] Date Acquired: [6:10/10/2015] [N/A:N/A] Weeks of Treatment: [6:29] [N/A:N/A] Wound Status: [6:Healed - Epithelialized] [N/A:N/A] Measurements L x W x [name] 0x0x0 [N/A:N/A] (cm) Area (cm) : [6:0] [N/A:N/A] Volume (cm) : [6:0] [N/A:N/A] % Reduction in Area: [6:100.00%] [N/A:N/A] % Reduction in Volume: 100.00% [N/A:N/A] Classification: [6:Grade 1] [N/A:N/A] Periwound Skin Texture: No Abnormalities Noted [N/A:N/A] Periwound Skin [6:No Abnormalities Noted] [N/A:N/A] Moisture: Periwound Skin Color: No Abnormalities Noted [N/A:N/A] Tenderness on [6:No] [N/A:N/A] Treatment Notes Electronic Signature(s) Signed: 05/03/2016 5:17:01 PM By: Alejandro Mulling Entered By: Alejandro Mulling on 05/03/2016 16:07:00 Borgerding, Keven [name] ([id number]) Turi, [name] ([id number]) -------------------------------------------------------------------------------- Multi-Disciplinary Care Plan Details Patient Name: BISMA, KLETT [name] Date of Service: 05/03/2016 3:45 PM Medical Record Number: [id number] Patient Account Number: [id number] Date of Birth/Sex: [birth year] (70 y.o. Female) Treating RN: Ashok Cordia, Debi Primary Care Physician: Leotis Shames Other Clinician: Referring Physician: Leotis Shames Treating Physician/Extender: Elayne Snare in Treatment: 71 Active Inactive Orientation to the Wound Care Program Nursing Diagnoses: Knowledge deficit related to the wound healing center program Goals: Patient/caregiver will verbalize understanding of the Wound Healing Center Program Date Initiated: 01/31/2015 Goal Status: Active Interventions: Provide education on orientation to the wound center Notes: Venous Leg Ulcer Nursing Diagnoses: Potential for venous Insuffiency (use before diagnosis confirmed) Goals: Non-invasive venous  studies are completed as ordered Date Initiated: 01/31/2015 Goal Status: Active Patient/caregiver will verbalize understanding of disease process and disease management Date Initiated: 01/31/2015 Goal Status: Active Interventions: Assess peripheral edema status every visit. Notes: Wound/Skin Impairment Nursing Diagnoses: Impaired tissue integrity Knowledge deficit related to smoking impact on wound healing Medico, [name] ([id number]) Goals: Patient/caregiver will verbalize understanding of skin care regimen Date Initiated: 01/31/2015 Goal Status: Active Ulcer/skin breakdown will heal within 14 weeks Date Initiated: 01/31/2015 Goal Status: Active Interventions: Assess ulceration(s) every visit Notes: Electronic Signature(s) Signed: 05/03/2016 5:17:01 PM By: Alejandro Mulling Entered By: Alejandro Mulling on 05/03/2016 16:10:38 Pavek, [name] ([id number]) -------------------------------------------------------------------------------- Pain Assessment Details Patient Name: Kristin Mclean [name] Date of Service: 05/03/2016 3:45 PM Medical Record Number: [id number] Patient Account Number: [id number] Date of Birth/Sex: Feb 13, 1946 (70 y.o. Female) Treating RN: Phillis Haggis Primary Care Physician: Leotis Shames Other Clinician:  Referring Physician: Glendon Axe Treating Physician/Extender: Benjaman Pott in Treatment: 81 Active Problems Location of Pain Severity and Description of Pain Patient Has Paino No Site Locations With Dressing Change: No Pain Management and Medication Current Pain Management: Notes Topical or injectable lidocaine is offered to patient for acute pain when surgical debridement is performed. If needed, Patient is instructed to use over the counter pain medication for the following 24-48 hours after debridement. Wound care MDs do not prescribed pain medications. Electronic Signature(s) Signed: 05/03/2016 5:17:01 PM By: Alric Quan Entered By: Alric Quan on 05/03/2016 15:48:25 Prentiss, Dan [name] ([id number]) -------------------------------------------------------------------------------- Patient/Caregiver Education Details Patient Name: [name] Date of Service: 05/03/2016 3:45 PM Medical Record Number: [id number] Patient Account Number: [id number] Date of Birth/Gender: 1946-05-21 (70 y.o. Female) Treating RN: Carolyne Fiscal, Debi Primary Care Physician: Glendon Axe Other Clinician: Referring Physician: Glendon Axe Treating Physician/Extender: Benjaman Pott in Treatment: 14 Education Assessment Education Provided To: Patient Education Topics Provided Wound/Skin Impairment: Handouts: Other: change dressing as ordered Methods: Demonstration, Explain/Verbal Responses: State content correctly Electronic Signature(s) Signed: 05/03/2016 5:17:01 PM By: Alric Quan Entered By: Alric Quan on 05/03/2016 16:26:52 Levert, [name] ([id number]) -------------------------------------------------------------------------------- Wound Assessment Details Patient Name: Diloreto, [name] Date of Service: 05/03/2016 3:45 PM Medical Record Number: [id number] Patient Account Number: [id number] Date of Birth/Sex: [birth year] (70 y.o. Female) Treating RN: Carolyne Fiscal, Debi Primary Care Physician: Glendon Axe Other Clinician: Referring Physician: Glendon Axe Treating Physician/Extender: Benjaman Pott in Treatment: 65 Wound Status Wound Number: 6 Primary Diabetic Wound/Ulcer of the Lower Etiology: Extremity Wound Location: Right Lower Leg - Medial Secondary Pyoderma Wounding Event: Gradually Appeared Etiology: Date Acquired: 10/10/2015 Wound Open Weeks Of Treatment: 29 Status: Clustered Wound: No Comorbid Cataracts, Asthma, Coronary Artery History: Disease, Hypertension, Type II Diabetes, Osteoarthritis, Neuropathy Photos Photo Uploaded By: Alric Quan on 05/03/2016 16:36:09 Wound Measurements Length: (cm)  0.1 Width: (cm) 0.1 Depth: (cm) 0.1 Area: (cm) 0.008 Volume: (cm) 0.001 % Reduction in Area: 99.8% % Reduction in Volume: 99.7% Epithelialization: Large (67-100%) Tunneling: No Undermining: No Wound Description Classification: Grade 1 Foul Odor After Wound Margin: Flat and Intact Exudate Amount: Small Exudate Type: Serous Exudate Color: amber Cleansing: No Wound Bed Granulation Amount: Large (67-100%) Exposed Structure Granulation Quality: Pink Fascia Exposed: No Rudden, [name] ([id number]) Necrotic Amount: None Present (0%) Fat Layer Exposed: No Tendon Exposed: No Muscle Exposed: No Joint Exposed: No Bone Exposed: No Limited to Skin Breakdown Periwound Skin Texture Texture Color No Abnormalities Noted: No No Abnormalities Noted: No Callus: No Atrophie Blanche: No Crepitus: No Cyanosis: No Excoriation: No Ecchymosis: No Fluctuance: No Erythema: No Friable: No Hemosiderin Staining: No Induration: No Mottled: No Localized Edema: No Pallor: No Rash: No Rubor: No Scarring: No Temperature / Pain Moisture Temperature: No Abnormality No Abnormalities Noted: No Tenderness on Palpation: Yes Dry / Scaly: No Maceration: No Moist: No Wound Preparation Ulcer Cleansing: Rinsed/Irrigated with Saline Topical Anesthetic Applied: None Treatment Notes Wound #6 (Right, Medial Lower Leg) 1. Cleansed with: Clean wound with Normal Saline 5. Secondary Dressing Applied Bordered Foam Dressing Notes RTD, gauze and conform secured with stretch net Electronic Signature(s) Signed: 05/03/2016 5:17:01 PM By: Alric Quan Entered By: Alric Quan on 05/03/2016 16:09:30 Doenges, [name] ([id number]) -------------------------------------------------------------------------------- Vitals Details Patient Name: [name] Date of Service: 05/03/2016 3:45 PM Medical Record Number: [id number] Patient Account Number: [id number] Date of Birth/Sex: [birth year] (70 y.o.  Female) Treating RN: Carolyne Fiscal, Debi Primary Care Physician: Glendon Axe Other Clinician: Referring Physician: Glendon Axe Treating  Physician/Extender: Benjaman Pott in Treatment: 61 Vital Signs Time Taken: 15:48 Temperature (F): 97.6 Height (in): 65 Pulse (bpm): 62 Weight (lbs): 248 Respiratory Rate (breaths/min): 20 Body Mass Index (BMI): 41.3 Blood Pressure (mmHg): 135/53 Reference Range: 80 - 120 mg / dl Electronic Signature(s) Signed: 05/03/2016 5:17:01 PM By: Alric Quan Entered By: Alric Quan on 05/03/2016 15:48:48

## 2016-05-10 ENCOUNTER — Encounter: Payer: Commercial Managed Care - HMO | Admitting: Surgery

## 2016-05-10 DIAGNOSIS — R0683 Snoring: Secondary | ICD-10-CM | POA: Insufficient documentation

## 2016-05-10 DIAGNOSIS — E11622 Type 2 diabetes mellitus with other skin ulcer: Secondary | ICD-10-CM | POA: Diagnosis not present

## 2016-05-10 DIAGNOSIS — I119 Hypertensive heart disease without heart failure: Secondary | ICD-10-CM | POA: Insufficient documentation

## 2016-05-10 NOTE — Progress Notes (Addendum)
CHALSEY, GARLOW ([id number]) Visit Report for 05/10/2016 Chief Complaint Document Details Patient Name: [name], [name] 05/10/2016 10:45 Date of Service: AM Medical Record [id number] Number: Patient Account Number: [id number] 31-Mar-1946 (70 y.o. Treating RN: [name] Date of Birth/Sex: Female) Other Clinician: Primary Care Physician: [facility], Delana Meyer Treating [name] Referring Physician: Glendon Axe Physician/Extender: Weeks in Treatment: 66 Information Obtained from: Patient Chief Complaint Patient presents to the wound care center for a consult due non healing wound 70 year old patient comes with a history of having a ulcer on the left lower extremity for the past 4 weeks. she says she's had swelling of both lower extremities for about a year after she started having prednisone. 02/07/2015 -- her vascular appointments obtained were in the first and third week of June. she is able to go to [facility] and we will try and get her some earlier appointments. Other than that nothing else has changed in her management. Electronic Signature(s) Signed: 05/10/2016 11:09:20 AM By: [name], [name] Entered By: [name] on 05/10/2016 11:09:20 DAWNYELL, MURNANE ([id number]) -------------------------------------------------------------------------------- HPI Details Patient Name: [name], [name] 05/10/2016 10:45 Date of Service: AM Medical Record [id number] Number: Patient Account Number: [id number] May 06, 1946 (70 y.o. Treating RN: [name] Date of Birth/Sex: Female) Other Clinician: Primary Care Physician: [facility], Delana Meyer Treating Marquita Lias Referring Physician: Glendon Axe Physician/Extender: Weeks in Treatment: 16 History of Present Illness HPI Description: 70 year old patient who is known to have diabetes mellitus type 2, chronic renal insufficiency, coronary artery disease, hypertension, hypercholesterolemia, temporal arteritis and inflammatory  arthritiss also has a history of having a hysterectomy and some orthopedic related surgeries. The ulcer on the left lower extremity started off as a blister and then. Got progressively worse. She does not have any fever or chills and has not had any recent surgical intervention for this. Her last hemoglobin A1c was 10.1 in September 2015. She has been recently put on doxycycline by her PCP. She is now also allergic to doxycycline and was this was changed over to Keflex. 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 She does see a cardiologist and also takes a diuretic. 02/07/2015 her arterial and venous duplex studies to be done have dates been given as the first and third week of June. This is at [facility]. We are going to try and get early appointments at [facility]. other than that nothing has changed in her management. 02/14/2015 -- we have been able to get her an appointment in [facility] on May 20 which is much earlier than her previous ones at [place]. She continues with her prednisone and her sugars are in the range of 150-200. 02/21/2015 We were able to get a vascular lab workup for her today and she is going to be there at 2:00 this afternoon. the swelling of her leg has gone down significantly but she still has some tenderness over the wounds. 02/28/2015 - She has had one of two vascular workups done, and this coming Tuesday has another, at [facility]. She continues to be on steroid medications. She has significant sensitivity in her left lower extremity and has pain suggestive of neuropathic pain and I have asked her to address this with her primary care physician. 03/07/2015 -- The patient saw Dr. Lucky Cowboy for a consultation and he has had her arteries are okay but she has 2 incompetent veins on the  left lower extremity and he is going to set her up  for surgery. Official report is awaited. Addendum: Official reports are now available and on 03/04/2015. She was seen and lower extremity venous duplex exam was done. There was reflux present within the left greater saphenous vein below the knee and also the left small saphenous vein. Arterial duplex showed normal triphasic waveforms throughout the left lower extremity without any significant stenosis. Her ABIs were noncompressible bilaterally but a waveforms were normal and a digital pressures were normal bilaterally consistent with no significant arterial insufficiency. He has recommended endovenous ablation of both the left small saphenous and the left great saphenous vein. This would still be scheduled later. 03/14/2015 -- she has heard back from the vascular office and has surgery scheduled for sometime in July. HENREITTA, HAYDEL ([id number]) Her rheumatologist has decreased her prednisone dosage but she still on it. She has also had cataract surgery in her right eye recently this week. 03/20/2015 - No new complaints today. Pain improved. No fever or chills. Tolerating 2 layer compression. 04/14/2015 -- she was doing very well today she went off on vacation and now her edema has increased markedly the ulceration is bigger and her diabetes is not under control. 04/21/2015 -- I spoke to her PCP Dr. Candiss Norse and discussed the management which would include being seen by a general surgeon for debridement and taking multiple punch biopsies which would help in establishing the diagnosis of this is a vasculitis. She is agreeable about this and will set her up for the procedure with Dr. Tamala Julian at [facility], [place]. She was seen by the surgeon Dr. Jamal Collin. 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 She is scheduled to have laser ablation done next week.Pt was sent here for possible biopsy to  look for vasculitis. Feel it would be better to wait after laser ablation is completed- the ulcer may heal fully and biopsy may not be necessary 04/29/2015 -- she had the venous ablation done by Dr. Lucky Cowboy last Friday and we do not have any notes yet. She is doing fine otherwise. 05/06/2015 --Review of her recent vascular intervention shows that she was seen by Dr. Lucky Cowboy on 04/29/2015. The follow-up duplex which was done showed that both the great saphenous vein and the small saphenous vein remained patent with reflux consistent with an unsuccessful ablation. He has rescheduled her for another the endovenous ablation to be done in about 4 weekso time. 05/13/2015 -- he was seen by her surgeon Dr. Jamal Collin who asked her to continue with conservative therapy and he would speak to Dr. Lucky Cowboy about her management. Dr. Lucky Cowboy is going to schedule her surgery in the middle of August for a repeat endovenous ablation. Her pus culture from last week has grown : [place] her noted her sensitivity report but due to her multiple allergies I had tried clindamycin and she developed a rash with this too. She has been prescribed and anti-buttocks in the ER and is has it at home and she will let is know what she is going to be taking. 05/20/2015 -- she has developed a small spot on her right lower extremity but besides that it is not a full fledged ulceration. She did not get to see Dr. Lucky Cowboy last week and hopefully she will see him in the near future. 05/27/2015 -she is still awaiting her  appointment with Dr.Dew and her vascular procedure is not scheduled until August 19. She will be seeing her PCP tomorrow and I have asked her to convey our discussion so that she is aware that debridement has not been done yet. 06/03/2015 -- was seen by her rheumatologist Dr. Dorthula Matas, who has been treating her for temporal  arteritis and in his note has mentioned the possibility of vasculitis or pyoderma gangrenosum. He is lowering her prednisone to 12-1/2 mg for 1 month and then 10 mg per the next month. I will again make an attempt to speak to her PCP Dr. Candiss Norse and her surgeon Dr. 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 06/17/2015 -- Dr.Dew did her vascular procedure last week and a follow-up venous ultrasound shows good resolution of the veins as per the patient's history. He is to see her back in 2 weeks. ARETTA, FLASCH ([id number]) 07/07/2015. -- the patient has had a heavy growth of Proteus mirabilis and Enterococcus faecalis. These are sensitive to several drugs but the problem is she has allergies to all of these and hence I would like her to see Dr. Ola Spurr for this. She is also due to see Dr. Lucky Cowboy tomorrow and I will discussed the management with him including debridement under anesthesia and possible biopsies. 07/14/2015 -- she has an appointment to see Dr. Ola Spurr tomorrow and did see Dr. Bunnie Domino PA who will discuss my request with him. 07/21/2015 -- saw Dr. Ola Spurr was able to do a test on her and has put her on amoxicillin. She has been tolerating that and has had no problems with allergies to this. 07/28/2015 -- Last Friday I spoke to Dr. Leotis Pain regarding her care and he said that her right leg did not need any surgery and on the left leg was doing pretty good. We did agree that if she undergoes any procedure in the future he would do a couple of punch biopsies of the wound. 08/18/2015 -- her right leg is very tender and there is significant amount of slough. The left leg is looking much better 09/02/2015 -- she is going to have a debridement and punch biopsies of her right lower extremity by Dr. Leotis Pain this coming Thursday. Also seen Dr. Ola Spurr who has continued her on  ciprofloxacin. 09/09/2015 -- on 09/04/2015 Dr. Leotis Pain took her to surgery - Irrigation and excisional debridement of skin, soft tissue, and muscle to about 40 cm2 to the right posterior calf with biopsy. Pathology results are -- DIAGNOSIS: SKIN, RIGHT LOWER EXTREMITY; BIOPSY: SKIN AND SOFT TISSUE WITH ULCERATION, SEE NOTE. - NEGATIVE FOR DYSPLASIA AND MALIGNANCY. Note: There is acute inflammation and ulceration of the epidermis. The dermis shows nonspecific inflammation, neovascularization, and hemosiderin deposition. The differential diagnosis for these findings includes stasis dermatitis, nonspecific dermatitis, and infection. Correlation with clinical findings is required. A PAS fungal stain is obtained and results will be reported in an addendum. cultures were also sent and this grew Pseudomonas aeruginosa, Escherichia coli, Proteus mirabilis, Klebsiella pneumoniae and they were all sensitive to ciprofloxacin which she is on. 09/16/2015 -- he saw Dr. 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 He is going to call the patient in and discuss with her the management possibly with Imuran. I will continue treating  her locally. 09/23/2015 -- she saw Dr. Ola Spurr today who was going to continue the antibiotics for now and stop after this course. She has an appointment to see Dr. Jefm Bryant in about a week's time. Her wound VAC has arrived but she did not bring it with her today. We will try and set her up for changes to the right leg 3 times a week. She is here for her first application of Apligraf to the left lower extremity. 09/30/2015 -- she has seen Dr. 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 because she is ambulatory at home health will not apply a wound VAC and she will have  to come here 3 times a week on Monday Wednesday and Friday. 10/10/2015 -- she is here for a second application of Apligraf to her left lower extremity. 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 Jefm Bryant. Other than that she is doing well 10/31/2015 -- she is here for a third application of Apligraf to her left lower extremity. after reviewing her wound on the right side I noted that it is granulating extremely well and we will use the remnants of the Apligraf on the right leg. HAYVN, DECHERT ([id number]) 11/14/2015 -- she was recently admitted to the hospital between January 11 and 11/10/2015 for uncontrolled hypertension, diabetes mellitus, acute kidney injury. during her admission she was supported by wound care and also by antibiotics. Around this time her immunosuppression was stopped by Dr. Nunzio Cory. She is feeling much better now. 11/21/2015 -- she has had her fourth application of Apligraf today and it has been shared between the left and the right leg 11/24/2015 -- was here for a wound VAC change but it was noticed that she had profuse bleeding from the lobe medial part of her right lower extremity and I was called in to evaluate her. 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 She has been doing fine since then 12/19/2015 -- she has finished 5 applications of Apligraf to the left lower extremity and this is doing great. He began with pain and swelling of her right ankle a couple of days ago and it has caused her much distress. She does not have any fever or any change in her general health. 12/22/2015 -- the patient's pain and swelling of the right lower extremity has resolved over the weekend. The left lower extremity is completely healed. She has had the first application of Apligraf on the right leg today. 01/02/2016 -- She is  here for the second application of Apligraf on the right leg today. 01/19/2016 -- she is here for the third application of Apligraf to the right posterior lateral leg. 02/02/2016 -- today she has had her fourth application of Apligraf and we have applied some of it to the right posterior lateral leg and the remaining waist we have used for the left anterior medial leg. 02/12/2016 -- today she is use the fifth application of Apligraf to the right posterior lateral leg. The remaining to be wasted has been applied to the left anterior medial leg. 03/01/2016 -- her copayment for the Apligraf is about $300 apiece and she's been to talk to her family to see if she can afford it. 03/15/2016 -- she was able to get some charity care to help with her  copayment and we have applied the first Apligraf to her right anterior medial leg. 03/29/2016 -- she is here for a second application to the right anterior medial leg. 04/05/2016 -- I had asked her to check with Dr. Jefm Bryant regarding her treatment with her immunosuppression for pyoderma gangrenosum and she has an appointment pending tomorrow. 04/12/2016 -- she saw Dr.Kernodle, at the clinic for rheumatology review. He recommended she resume Imuran at 75 and boost prednisone back to 5 mg temporarily. He also put her on Neurontin 100 mg 3 times daily. Electronic Signature(s) Signed: 05/10/2016 11:09:34 AM By: [name], [name] Entered By: [name] on 05/10/2016 11:09:34 CHEYLA, REA ([id number]) -------------------------------------------------------------------------------- Physical Exam Details Patient Name: [name], [name] 05/10/2016 10:45 Date of Service: AM Medical Record [id number] Number: Patient Account Number: [id number] 22-Apr-1946 (70 y.o. Treating RN: [name] Date of Birth/Sex: Female) Other Clinician: Primary Care Physician: [facility], Delana Meyer Treating [name] Referring Physician: Glendon Axe Physician/Extender: Weeks in Treatment: 66 Constitutional . Pulse regular. Respirations normal and unlabored. Afebrile. . Eyes Nonicteric. Reactive to light. Ears, Nose, Mouth, and Throat Lips, teeth, and gums WNL.Marland Kitchen Moist mucosa without lesions. Neck supple and nontender. No palpable supraclavicular or cervical adenopathy. Normal sized without goiter. Respiratory WNL. No retractions.. Cardiovascular Pedal Pulses WNL. No clubbing, cyanosis or edema. Lymphatic No adneopathy. No adenopathy. No adenopathy. Musculoskeletal Adexa without tenderness or enlargement.. Digits and nails w/o clubbing, cyanosis, infection, petechiae, ischemia, or inflammatory conditions.. Integumentary (Hair, Skin) No suspicious lesions. No crepitus or fluctuance. No peri-wound warmth or erythema. No masses.Marland Kitchen Psychiatric Judgement and insight Intact.. No evidence of depression, anxiety, or agitation.. Notes her wounds have completely healed and I was able to remove a piece of eschar from the right medial lower extremity and under this there is septal scar and no open ulceration. Electronic Signature(s) Signed: 05/10/2016 11:10:07 AM By: [name], [name] Entered By: [name] on 05/10/2016 11:10:07 KAMARAH, DOPSON ([id number]) -------------------------------------------------------------------------------- Physician Orders Details Patient Name: ENAJAH, FARIN D. 05/10/2016 10:45 Date of Service: AM Medical Record [id number] Number: Patient Account Number: [id number] 1946-05-13 (70 y.o. Treating RN: [name] Date of Birth/Sex: Female) Other Clinician: Primary Care Physician: [facility], Delana Meyer Treating Helem Reesor Referring Physician: Glendon Axe Physician/Extender: Weeks in Treatment: 53 Verbal / Phone Orders: No Diagnosis Coding ICD-10 Coding Code Description E11.622 Type 2 diabetes mellitus with other skin ulcer E66.01 Morbid (severe) obesity due to excess calories I89.0  Lymphedema, not elsewhere classified L97.212 Non-pressure chronic ulcer of right calf with fat layer exposed I87.311 Chronic venous hypertension (idiopathic) with ulcer of right lower extremity Discharge From [facility] Services o Discharge from [place] Signature(s) Signed: 05/10/2016 1:02:04 PM By: [name] Signed: 05/10/2016 3:43:28 PM By: [name], [name] Entered By: [name] on 05/10/2016 11:10:52 Matarazzo, ADREANNE LEVAR ([id number]) -------------------------------------------------------------------------------- Problem List Details Patient Name: TOY, CUMPTON D. 05/10/2016 10:45 Date of Service: AM Medical Record [id number] Number: Patient Account Number: [id number] [birth year] (70 y.o. Treating RN: [name] Date of Birth/Sex: Female) Other Clinician: Primary Care Physician: Glendon Axe Treating [name] Referring Physician: Glendon Axe Physician/Extender: Weeks in Treatment: 50 Active Problems ICD-10 Encounter Code Description Active Date Diagnosis E11.622 Type 2 diabetes mellitus with other skin ulcer 01/31/2015 Yes E66.01 Morbid (severe) obesity due to excess calories 01/31/2015 Yes I89.0 Lymphedema, not elsewhere classified 01/31/2015 Yes L97.212 Non-pressure chronic ulcer of right calf with fat layer 11/24/2015 Yes exposed I87.311 Chronic venous hypertension (idiopathic) with ulcer of 11/24/2015 Yes right lower extremity Inactive Problems  Resolved Problems ICD-10 Code Description Active Date Resolved Date L97.322 Non-pressure chronic ulcer of left ankle with fat layer 01/31/2015 01/31/2015 exposed [id number] Varicose veins of left lower extremity with both ulcer of 03/07/2015 03/07/2015 calf and inflammation I83.223 03/07/2015 03/07/2015 Gandolfo, Lashayla D. 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 05/10/2016 10:45 Date of Service: AM Medical Record [id number] Number: Patient Account Number: [id number] 1946/04/02 (70 y.o. Treating RN: [name] Date of Birth/Sex: Female) Other Clinician: Primary Care Physician: [facility], Delana Meyer Treating [name] Referring Physician: Glendon Axe Physician/Extender: Weeks in Treatment: 16 Subjective Chief Complaint Information obtained from Patient Patient presents to the wound care center for a consult due non healing wound 70 year old patient comes with a history of having a ulcer on the left lower extremity for the past 4 weeks. she says she's had swelling of both lower extremities for about a year after she started having prednisone. 02/07/2015 -- her vascular appointments obtained were in the first and third week of June. she is able to go to [place] and we will try and get her some earlier appointments. Other than that nothing else has changed in her management. History of Present Illness (HPI) 70 year old patient who is known to have diabetes mellitus type 2, chronic renal insufficiency, coronary artery disease, hypertension, hypercholesterolemia, temporal arteritis and inflammatory arthritiss also has a history of having a hysterectomy and some orthopedic related surgeries. The ulcer on the left lower extremity started off as a blister and then. Got progressively worse. She does not have any fever or chills and has not had any recent surgical intervention for this. Her last hemoglobin A1c was 10.1 in September 2015. She has been recently put on doxycycline by her PCP. She is now also allergic to doxycycline and was this was changed over to Keflex. due to her temporal  arteritis she has been on prednisone for about a year and she says ever since that she has had swelling of both lower extremities. She does see a cardiologist and also takes a diuretic. 02/07/2015 her arterial and venous duplex studies to be done have dates been given as the first and third week of June. This is at [facility]. We are going to try and get early appointments at [facility]. other than that nothing has changed in her management. 02/14/2015 -- we have been able to get her an appointment in [facility] on May 20 which is much earlier than her previous ones at [place]. She continues with her prednisone and her sugars are in the range of 150-200. 02/21/2015 We were able to get a vascular lab workup for her today and she is going to be there at 2:00 this afternoon. the swelling of her leg has gone down significantly but she still has some tenderness over the wounds. 02/28/2015 - She has had one of two vascular workups done, and this coming Tuesday has another, at [facility]. She continues to be on steroid medications. She has significant sensitivity in her left lower extremity and has pain suggestive of neuropathic pain and I have asked her to address this with her primary care physician. 03/07/2015 -- The patient saw Dr. Lucky Cowboy for a consultation and he has had her arteries are okay but  she has 2 incompetent veins on the left lower extremity and he is going to set her up for surgery. Official report is TILDEN, INZER ([id number]) awaited. Addendum: Official reports are now available and on 03/04/2015. She was seen and lower extremity venous duplex exam was done. There was reflux present within the left greater saphenous vein below the knee and also the left small saphenous vein. Arterial duplex showed normal triphasic waveforms throughout the left lower extremity without any significant stenosis. Her ABIs were noncompressible bilaterally but a waveforms  were normal and a digital pressures were normal bilaterally consistent with no significant arterial insufficiency. He has recommended endovenous ablation of both the left small saphenous and the left great saphenous vein. This would still be scheduled later. 03/14/2015 -- she has heard back from the vascular office and has surgery scheduled for sometime in July. Her rheumatologist has decreased her prednisone dosage but she still on it. She has also had cataract surgery in her right eye recently this week. 03/20/2015 - No new complaints today. Pain improved. No fever or chills. Tolerating 2 layer compression. 04/14/2015 -- she was doing very well today she went off on vacation and now her edema has increased markedly the ulceration is bigger and her diabetes is not under control. 04/21/2015 -- I spoke to her PCP Dr. Candiss Norse and discussed the management which would include being seen by a general surgeon for debridement and taking multiple punch biopsies which would help in establishing the diagnosis of this is a vasculitis. She is agreeable about this and will set her up for the procedure with Dr. Tamala Julian at [facility]. She was seen by the surgeon Dr. Jamal Collin. 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 She is scheduled to have laser ablation done next week.Pt was sent here for possible biopsy to look for vasculitis. Feel it would be better to wait after laser ablation is completed- the ulcer may heal fully and biopsy may not be necessary 04/29/2015 -- she had the venous ablation done by Dr. Lucky Cowboy last Friday and we do not have any notes yet. She is doing fine otherwise. 05/06/2015 --Review of her recent vascular intervention shows that she was seen by Dr. Lucky Cowboy on 04/29/2015. The follow-up duplex which was done showed that both the great saphenous vein and the small saphenous vein  remained patent with reflux consistent with an unsuccessful ablation. He has rescheduled her for another the endovenous ablation to be done in about 4 weeks time. 05/13/2015 -- he was seen by her surgeon Dr. Jamal Collin who asked her to continue with conservative therapy and he would speak to Dr. Lucky Cowboy about her management. Dr. Lucky Cowboy is going to schedule her surgery in the middle of August for a repeat endovenous ablation. Her pus culture from last week has grown : Bruno her noted her sensitivity report but due to her multiple allergies I had tried clindamycin and she developed a rash with this too. She has been prescribed and anti-buttocks in the ER and is has it at home and she will let is know what she is going to be taking. 05/20/2015 -- she has developed a small spot on her right lower extremity but besides that it is not a full fledged ulceration. She did not get to see Dr. Lucky Cowboy last week and hopefully she will see him in the near  future. 05/27/2015 -she is still awaiting her appointment with Dr.Dew and her vascular procedure is not scheduled until August 19. She will be seeing her PCP tomorrow and I have asked her to convey our discussion so that she is aware that debridement has not been done yet. TELSA, NAGASAWA ([id number]) 06/03/2015 -- was seen by her rheumatologist Dr. 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 He is lowering her prednisone to 12-1/2 mg for 1 month and then 10 mg per the next month. I will again make an attempt to speak to her PCP Dr. Candiss Norse and her surgeon Dr. 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 06/17/2015 -- Dr.Dew did her vascular procedure last week and  a follow-up venous ultrasound shows good resolution of the veins as per the patient's history. He is to see her back in 2 weeks. 07/07/2015. -- the patient has had a heavy growth of Proteus mirabilis and Enterococcus faecalis. These are sensitive to several drugs but the problem is she has allergies to all of these and hence I would like her to see Dr. Ola Spurr for this. She is also due to see Dr. Lucky Cowboy tomorrow and I will discussed the management with him including debridement under anesthesia and possible biopsies. 07/14/2015 -- she has an appointment to see Dr. Ola Spurr tomorrow and did see Dr. Bunnie Domino PA who will discuss my request with him. 07/21/2015 -- saw Dr. Ola Spurr was able to do a test on her and has put her on amoxicillin. She has been tolerating that and has had no problems with allergies to this. 07/28/2015 -- Last Friday I spoke to Dr. Leotis Pain regarding her care and he said that her right leg did not need any surgery and on the left leg was doing pretty good. We did agree that if she undergoes any procedure in the future he would do a couple of punch biopsies of the wound. 08/18/2015 -- her right leg is very tender and there is significant amount of slough. The left leg is looking much better 09/02/2015 -- she is going to have a debridement and punch biopsies of her right lower extremity by Dr. Leotis Pain this coming Thursday. Also seen Dr. Ola Spurr who has continued her on ciprofloxacin. 09/09/2015 -- on 09/04/2015 Dr. Leotis Pain took her to surgery - Irrigation and excisional debridement of skin, soft tissue, and muscle to about 40 cm2 to the right posterior calf with biopsy. Pathology results are -- DIAGNOSIS: SKIN, RIGHT LOWER EXTREMITY; BIOPSY: SKIN AND SOFT TISSUE WITH ULCERATION, SEE NOTE. - NEGATIVE FOR DYSPLASIA AND MALIGNANCY. Note: There is acute inflammation and ulceration of the epidermis. The dermis shows nonspecific inflammation, neovascularization, and  hemosiderin deposition. The differential diagnosis for these findings includes stasis dermatitis, nonspecific dermatitis, and infection. Correlation with clinical findings is required. A PAS fungal stain is obtained and results will be reported in an addendum. cultures were also sent and this grew Pseudomonas aeruginosa, Escherichia coli, Proteus mirabilis, Klebsiella pneumoniae and they were all sensitive to ciprofloxacin which she is on. 09/16/2015 -- he saw Dr. 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 He is going to call the patient in and discuss with her the management  possibly with Imuran. I will continue treating her locally. 09/23/2015 -- she saw Dr. Ola Spurr today who was going to continue the antibiotics for now and stop after this course. She has an appointment to see Dr. Jefm Bryant in about a week's time. Her wound VAC has arrived but she did not bring it with her today. We will try and set her up for changes to the right leg 3 times a week. She is here for her first application of Apligraf to the left lower extremity. NISSA, CAMPOPIANO ([id number]) 09/30/2015 -- she has seen Dr. 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 because she is ambulatory at home health will not apply a wound VAC and she will have to come here 3 times a week on Monday Wednesday and Friday. 10/10/2015 -- she is here for a second application of Apligraf to her left lower extremity. 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 Jefm Bryant. Other than that she is doing well 10/31/2015 -- she is here for a third application of Apligraf to her left lower extremity. after reviewing her wound on the right side I noted that it is granulating  extremely well and we will use the remnants of the Apligraf on the right leg. 11/14/2015 -- she was recently admitted to the hospital between January 11 and 11/10/2015 for uncontrolled hypertension, diabetes mellitus, acute kidney injury. during her admission she was supported by wound care and also by antibiotics. Around this time her immunosuppression was stopped by Dr. Nunzio Cory. She is feeling much better now. 11/21/2015 -- she has had her fourth application of Apligraf today and it has been shared between the left and the right leg 11/24/2015 -- was here for a wound VAC change but it was noticed that she had profuse bleeding from the lobe medial part of her right lower extremity and I was called in to evaluate her. 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 She has been doing fine since then 12/19/2015 -- she has finished 5 applications of Apligraf to the left lower extremity and this is doing great. He began with pain and swelling of her right ankle a couple of days ago and it has caused her much distress. She does not have any fever or any change in her general health. 12/22/2015 -- the patient's pain and swelling of the right lower extremity has resolved over the weekend. The left lower extremity is completely healed. She has had the first application of Apligraf on the right leg today. 01/02/2016 -- She is here for the second application of Apligraf on the right leg today. 01/19/2016 -- she is here for the third application of Apligraf to the right posterior lateral leg. 02/02/2016 -- today she has had her fourth application of Apligraf and we have applied some of it to the right posterior lateral leg and the remaining waist we have used for the left anterior medial leg. 02/12/2016 -- today she is use the fifth application of Apligraf to the right posterior lateral leg. The remaining to be wasted  has been applied to the left anterior medial leg. 03/01/2016 -- her copayment for the Apligraf is about $300 apiece and she's been to talk to her family to see if she can afford it. 03/15/2016 -- she was able to get  some charity care to help with her copayment and we have applied the first Apligraf to her right anterior medial leg. 03/29/2016 -- she is here for a second application to the right anterior medial leg. 04/05/2016 -- I had asked her to check with Dr. Jefm Bryant regarding her treatment with her immunosuppression for pyoderma gangrenosum and she has an appointment pending tomorrow. 04/12/2016 -- she saw Dr.Kernodle, at the clinic for rheumatology review. He recommended she resume Imuran at 75 and boost prednisone back to 5 mg temporarily. He also put her on Neurontin 100 mg 3 times daily. Objective Porada, Tavaria D. ([id number]) Constitutional Pulse regular. Respirations normal and unlabored. Afebrile. Vitals Time Taken: 10:52 AM, Height: 65 in, Weight: 248 lbs, BMI: 41.3, Temperature: 97.9 F, Pulse: 63 bpm, Respiratory Rate: 18 breaths/min, Blood Pressure: 150/65 mmHg. Eyes Nonicteric. Reactive to light. Ears, Nose, Mouth, and Throat Lips, teeth, and gums WNL.Marland Kitchen Moist mucosa without lesions. Neck supple and nontender. No palpable supraclavicular or cervical adenopathy. Normal sized without goiter. Respiratory WNL. No retractions.. Cardiovascular Pedal Pulses WNL. No clubbing, cyanosis or edema. Lymphatic No adneopathy. No adenopathy. No adenopathy. Musculoskeletal Adexa without tenderness or enlargement.. Digits and nails w/o clubbing, cyanosis, infection, petechiae, ischemia, or inflammatory conditions.Marland Kitchen Psychiatric Judgement and insight Intact.. No evidence of depression, anxiety, or agitation.. General Notes: her wounds have completely healed and I was able to remove a piece of eschar from the right medial lower extremity and under this there is septal scar and no open  ulceration. Integumentary (Hair, Skin) No suspicious lesions. No crepitus or fluctuance. No peri-wound warmth or erythema. No masses.. Wound #6 status is Healed - Epithelialized. Original cause of wound was Gradually Appeared. The wound is located on the Right,Medial Lower Leg. The wound measures 0cm length x 0cm width x 0cm depth; 0cm^2 area and 0cm^3 volume. The wound is limited to skin breakdown. There is no tunneling or undermining noted. There is a small amount of serous drainage noted. The wound margin is flat and intact. There is no granulation within the wound bed. There is no necrotic tissue within the wound bed. The periwound skin appearance did not exhibit: Callus, Crepitus, Excoriation, Fluctuance, Friable, Induration, Localized Edema, Rash, Scarring, Dry/Scaly, Maceration, Moist, Atrophie Blanche, Cyanosis, Ecchymosis, Hemosiderin Staining, Mottled, Pallor, Rubor, Erythema. Periwound temperature was noted as No Abnormality. The periwound has tenderness on palpation. 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 I have discharged her from the wound care services with the instructions of elevation and exercise and using her compression stockings of the 30-40 mm variety. Can come back and see as as needed Electronic Signature(s) Signed: 05/10/2016 11:11:24 AM By: [name], [name] Entered By: [name] on 05/10/2016 11:11:24 Bentivegna, Janiah D. ([id number]) -------------------------------------------------------------------------------- SuperBill  Details Patient Name: Kristin Stare D. Date of Service: 05/10/2016 Medical Record Number: [id number] Patient Account Number: [id number] Date of Birth/Sex: 1946/10/21 (70 y.o. 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 Facility Procedures CPT4 Code: [id number] Description: [phone] - WOUND CARE VISIT-LEV 2 EST PT Modifier: Quantity: 1 Physician Procedures CPT4 Code Description: [id number] - WC PHYS LEVEL 2 - EST PT ICD-10 Description Diagnosis E11.622 Type 2 diabetes mellitus with other skin ulcer I89.0 Lymphedema, not elsewhere classified L97.212 Non-pressure chronic ulcer of right calf with fat la  I87.311 Chronic venous hypertension (idiopathic) with ulcer Modifier: yer exposed of right lower Quantity: 1 extremity Electronic Signature(s) Signed: 05/10/2016 1:02:04 PM By: [name] Signed: 05/10/2016 3:43:28 PM By: [name], [name] Previous Signature: 05/10/2016 11:11:40 AM Version By: [name], [name] Entered By: [name] on 05/10/2016 11:15:50

## 2016-05-10 NOTE — Progress Notes (Signed)
Kristin, [name] ([id number]) Visit Report for 05/10/2016 Arrival Information Details Patient Name: [name], [name] 05/10/2016 10:45 Date of Service: AM Medical Record [id number] Number: Patient Account Number: [id number] [birth year] (70 y.o. Treating RN: [name] Date of Birth/Sex: Female) Other Clinician: Primary Care Physician: [facility], Delana Meyer Treating Britto, Errol Referring Physician: Glendon Axe Physician/Extender: Weeks in Treatment: 92 Visit Information History Since Last Visit Added or deleted any medications: No Patient Arrived: Ambulatory Any new allergies or adverse reactions: No Arrival Time: 10:50 Had a fall or experienced change in No Accompanied By: self activities of daily living that may affect Transfer Assistance: None risk of falls: Patient Identification Verified: Yes Signs or symptoms of abuse/neglect since last No Secondary Verification Process Yes visito Completed: Hospitalized since last visit: No Patient Requires Transmission- No Pain Present Now: No Based Precautions: Patient Has Alerts: Yes Patient Alerts: Patient on Blood Thinner Electronic Signature(s) Signed: 05/10/2016 1:02:04 PM By: [name] Entered By: [name] on 05/10/2016 10:51:21 Neiswonger, Tia Masker ([id number]) -------------------------------------------------------------------------------- Clinic Level of Care Assessment Details Patient Name: Kristin, [name] 05/10/2016 10:45 Date of Service: AM Medical Record [id number] Number: Patient Account Number: [id number] January 03, 1946 (70 y.o. Treating RN: [name] Date of Birth/Sex: Female) Other Clinician: Primary Care Physician: [facility], Delana Meyer Treating Britto, Errol Referring Physician: Glendon Axe Physician/Extender: Weeks in Treatment: 42 Clinic Level of Care Assessment Items TOOL 4 Quantity Score []  - Use when only an EandM is performed on FOLLOW-UP visit 0 ASSESSMENTS - Nursing Assessment / Reassessment X  - Reassessment of Co-morbidities (includes updates in patient status) 1 10 X - Reassessment of Adherence to Treatment Plan 1 5 ASSESSMENTS - Wound and Skin Assessment / Reassessment X - Simple Wound Assessment / Reassessment - one wound 1 5 []  - Complex Wound Assessment / Reassessment - multiple wounds 0 []  - Dermatologic / Skin Assessment (not related to wound area) 0 ASSESSMENTS - Focused Assessment []  - Circumferential Edema Measurements - multi extremities 0 []  - Nutritional Assessment / Counseling / Intervention 0 X - Lower Extremity Assessment (monofilament, tuning fork, pulses) 1 5 []  - Peripheral Arterial Disease Assessment (using hand held doppler) 0 ASSESSMENTS - Ostomy and/or Continence Assessment and Care []  - Incontinence Assessment and Management 0 []  - Ostomy Care Assessment and Management (repouching, etc.) 0 PROCESS - Coordination of Care X - Simple Patient / Family Education for ongoing care 1 15 []  - Complex (extensive) Patient / Family Education for ongoing care 0 []  - Staff obtains Programmer, systems, Records, Test Results / Process Orders 0 []  - Staff telephones HHA, Nursing Homes / Clarify orders / etc 0 [place], Keaundra D. ([id number]) []  - Routine Transfer to another Facility (non-emergent condition) 0 []  - Routine Hospital Admission (non-emergent condition) 0 []  - New Admissions / Biomedical engineer / Ordering NPWT, Apligraf, etc. 0 []  - Emergency Hospital Admission (emergent condition) 0 X - Simple Discharge Coordination 1 10 []  - Complex (extensive) Discharge Coordination 0 PROCESS - Special Needs []  - Pediatric / Minor Patient Management 0 []  - Isolation Patient Management 0 []  - Hearing / Language / Visual special needs 0 []  - Assessment of Community assistance (transportation, D/C planning, etc.) 0 []  - Additional assistance / Altered mentation 0 []  - Support Surface(s) Assessment (bed, cushion, seat, etc.) 0 INTERVENTIONS - Wound Cleansing / Measurement X -  Simple Wound Cleansing - one wound 1 5 []  - Complex Wound Cleansing - multiple wounds 0 X - Wound Imaging (photographs - any number of wounds) 1 5 []  -  Wound Tracing (instead of photographs) 0 X - Simple Wound Measurement - one wound 1 5 []  - Complex Wound Measurement - multiple wounds 0 INTERVENTIONS - Wound Dressings []  - Small Wound Dressing one or multiple wounds 0 []  - Medium Wound Dressing one or multiple wounds 0 []  - Large Wound Dressing one or multiple wounds 0 []  - Application of Medications - topical 0 []  - Application of Medications - injection 0 Demars, Loeta D. ([id number]) INTERVENTIONS - Miscellaneous []  - External ear exam 0 []  - Specimen Collection (cultures, biopsies, blood, body fluids, etc.) 0 []  - Specimen(s) / Culture(s) sent or taken to Lab for analysis 0 []  - Patient Transfer (multiple staff / [name]) 0 []  - Simple Staple / Suture removal (25 or less) 0 []  - Complex Staple / Suture removal (26 or more) 0 []  - Hypo / Hyperglycemic Management (close monitor of Blood Glucose) 0 []  - Ankle / Brachial Index (ABI) - do not check if billed separately 0 X - Vital Signs 1 5 Has the patient been seen at the hospital within the last three years: Yes Total Score: 70 Level Of Care: New/Established - Level 2 Electronic Signature(s) Signed: 05/10/2016 1:02:04 PM By: [name] Entered By: [name] on 05/10/2016 11:15:39 Eley, Tia Masker ([id number]) -------------------------------------------------------------------------------- Encounter Discharge Information Details Patient Name: Kristin, [name] 05/10/2016 10:45 Date of Service: AM Medical Record [id number] Number: Patient Account Number: [id number] [birth year] (70 y.o. Treating RN: [name] Date of Birth/Sex: Female) Other Clinician: Primary Care Physician: [facility], Delana Meyer Treating [name] Referring Physician: [place], [place]: Weeks in Treatment:  40 Encounter Discharge Information Items Discharge Pain Level: 0 Discharge Condition: Stable Ambulatory Status: Ambulatory Discharge Destination: Home Transportation: Private Auto Accompanied By: self Schedule Follow-up Appointment: No Medication Reconciliation completed and provided to Patient/Care No Cherysh Epperly: Provided on Clinical Summary of Care: 05/10/2016 Form Type Recipient Paper Patient LM Electronic Signature(s) Signed: 05/10/2016 11:16:44 AM By: Ruthine Dose Entered By: Ruthine Dose on 05/10/2016 11:16:43 Riess, Jahnavi DMarland Kitchen ([id number]) -------------------------------------------------------------------------------- Lower Extremity Assessment Details Patient Name: LATRINDA, CARAVEO D. 05/10/2016 10:45 Date of Service: AM Medical Record [id number] Number: Patient Account Number: [id number] Jan 20, 1946 (70 y.o. Treating RN: [name] Date of Birth/Sex: Female) Other Clinician: Primary Care Physician: [facility], Delana Meyer Treating [name] Referring Physician: Glendon Axe Physician/Extender: Weeks in Treatment: 66 Vascular Assessment Pulses: Posterior Tibial Palpable: [Right:Yes] Dorsalis Pedis Palpable: [Right:Yes] Extremity colors, hair growth, and conditions: Extremity Color: [Right:Hyperpigmented] Hair Growth on Extremity: [Right:No] Temperature of Extremity: [Right:Warm] Capillary Refill: [Right:< 3 seconds] Toe Nail Assessment Left: Right: Thick: No Discolored: No Deformed: No Improper Length and Hygiene: No Electronic Signature(s) Signed: 05/10/2016 1:02:04 PM By: [name] Entered By: [name] on 05/10/2016 10:56:45 Hansen, Tia Masker ([id number]) -------------------------------------------------------------------------------- Multi-Disciplinary Care Plan Details Patient Name: CHARNELE, BEATRICE D. 05/10/2016 10:45 Date of Service: AM Medical Record [id number] Number: Patient Account Number: [id number] [birth year] (70 y.o. Treating RN: [name] Date of Birth/Sex: Female) Other Clinician: Primary Care Physician: [facility], Delana Meyer Treating [name] Referring Physician: Glendon Axe Physician/Extender: Weeks in Treatment: 62 Active Inactive Electronic Signature(s) Signed: 05/10/2016 1:02:04 PM By: [name] Entered By: [name] on 05/10/2016 11:10:38 Schleyer, Tia Masker ([id number]) -------------------------------------------------------------------------------- Patient/Caregiver Education Details Patient Name: ELENORE, PERAINO D. 05/10/2016 10:45 Date of Service: AM Medical Record [id number] Number: Patient Account Number: [id number] [birth year] (70 y.o. Treating RN: [name] Date of Birth/Gender: Female) Other Clinician: Primary Care Physician: [facility], Delana Meyer Treating [name] Referring Physician: Glendon Axe Physician/Extender: Weeks in  Treatment: 66 Education Assessment Education Provided To: Patient Education Topics Provided Venous: Handouts: Other: continue wearing compression daily Methods: Explain/Verbal Responses: State content correctly Electronic Signature(s) Signed: 05/10/2016 1:02:04 PM By: [name] Entered By: [name] on 05/10/2016 11:16:27 Smalling, Maydelin DMarland Kitchen ([id number]) -------------------------------------------------------------------------------- Wound Assessment Details Patient Name: LAURREN, SIGG D. 05/10/2016 10:45 Date of Service: AM Medical Record [id number] Number: Patient Account Number: [id number] 12-Dec-1945 (70 y.o. Treating RN: [name] Date of Birth/Sex: Female) Other Clinician: Primary Care Physician: [facility], Delana Meyer Treating Britto, Errol Referring Physician: Glendon Axe Physician/Extender: Weeks in Treatment: 66 Wound Status Wound Number: 6 Primary Diabetic Wound/Ulcer of the Lower Etiology: Extremity Wound Location: Right Lower Leg - Medial Secondary Pyoderma Wounding Event: Gradually Appeared Etiology: Date Acquired:  10/10/2015 Wound Healed - Epithelialized Weeks Of Treatment: 30 Status: Clustered Wound: No Comorbid Cataracts, Asthma, Coronary Artery History: Disease, Hypertension, Type II Diabetes, Osteoarthritis, Neuropathy Photos Wound Measurements Length: (cm) 0 % Reduction in Width: (cm) 0 % Reduction in Depth: (cm) 0 Epithelializat Area: (cm) 0 Tunneling: Volume: (cm) 0 Undermining: Area: 100% Volume: 100% ion: Large (67-100%) No No Wound Description Classification: Grade 1 Foul Odor Afte Wound Margin: Flat and Intact Exudate Amount: Small Exudate Type: Serous Exudate Color: amber r Cleansing: No Wound Bed Granulation Amount: None Present (0%) Exposed Structure Knight, Feige D. ([id number]) Necrotic Amount: None Present (0%) Fascia Exposed: No Fat Layer Exposed: No Tendon Exposed: No Muscle Exposed: No Joint Exposed: No Bone Exposed: No Limited to Skin Breakdown Periwound Skin Texture Texture Color No Abnormalities Noted: No No Abnormalities Noted: No Callus: No Atrophie Blanche: No Crepitus: No Cyanosis: No Excoriation: No Ecchymosis: No Fluctuance: No Erythema: No Friable: No Hemosiderin Staining: No Induration: No Mottled: No Localized Edema: No Pallor: No Rash: No Rubor: No Scarring: No Temperature / Pain Moisture Temperature: No Abnormality No Abnormalities Noted: No Tenderness on Palpation: Yes Dry / Scaly: No Maceration: No Moist: No Wound Preparation Ulcer Cleansing: Rinsed/Irrigated with Saline Topical Anesthetic Applied: None Electronic Signature(s) Signed: 05/10/2016 1:02:04 PM By: [name] Entered By: [name] on 05/10/2016 11:10:08 Patty, Tia Masker ([id number]) -------------------------------------------------------------------------------- Vitals Details Patient Name: [name] 05/10/2016 10:45 Date of Service: AM Medical Record [id number] Number: Patient Account Number: [id number] Aug 04, 1946 (70 y.o. Treating RN:  [name] Date of Birth/Sex: Female) Other Clinician: Primary Care Physician: [facility], [place] Britto, Errol Referring Physician: [place], [place]: Weeks in Treatment: 56 Vital Signs Time Taken: 10:52 Temperature (F): 97.9 Height (in): 65 Pulse (bpm): 63 Weight (lbs): 248 Respiratory Rate (breaths/min): 18 Body Mass Index (BMI): 41.3 Blood Pressure (mmHg): 150/65 Reference Range: 80 - 120 mg / dl Electronic Signature(s) Signed: 05/10/2016 1:02:04 PM By: [name] Entered By: [name] on 05/10/2016 10:53:52

## 2016-05-19 ENCOUNTER — Ambulatory Visit: Payer: PPO | Attending: Internal Medicine

## 2016-06-07 ENCOUNTER — Encounter: Payer: Self-pay | Admitting: *Deleted

## 2016-06-07 DIAGNOSIS — K5720 Diverticulitis of large intestine with perforation and abscess without bleeding: Secondary | ICD-10-CM | POA: Diagnosis not present

## 2016-06-07 DIAGNOSIS — Z87891 Personal history of nicotine dependence: Secondary | ICD-10-CM

## 2016-06-07 DIAGNOSIS — K567 Ileus, unspecified: Secondary | ICD-10-CM | POA: Diagnosis not present

## 2016-06-07 DIAGNOSIS — M1990 Unspecified osteoarthritis, unspecified site: Secondary | ICD-10-CM | POA: Diagnosis present

## 2016-06-07 DIAGNOSIS — Z882 Allergy status to sulfonamides status: Secondary | ICD-10-CM

## 2016-06-07 DIAGNOSIS — N179 Acute kidney failure, unspecified: Secondary | ICD-10-CM | POA: Diagnosis not present

## 2016-06-07 DIAGNOSIS — Z880 Allergy status to penicillin: Secondary | ICD-10-CM

## 2016-06-07 DIAGNOSIS — M069 Rheumatoid arthritis, unspecified: Secondary | ICD-10-CM | POA: Diagnosis present

## 2016-06-07 DIAGNOSIS — E1122 Type 2 diabetes mellitus with diabetic chronic kidney disease: Secondary | ICD-10-CM | POA: Diagnosis present

## 2016-06-07 DIAGNOSIS — I130 Hypertensive heart and chronic kidney disease with heart failure and stage 1 through stage 4 chronic kidney disease, or unspecified chronic kidney disease: Secondary | ICD-10-CM | POA: Diagnosis present

## 2016-06-07 DIAGNOSIS — Z79899 Other long term (current) drug therapy: Secondary | ICD-10-CM

## 2016-06-07 DIAGNOSIS — T80219A Unspecified infection due to central venous catheter, initial encounter: Secondary | ICD-10-CM | POA: Diagnosis not present

## 2016-06-07 DIAGNOSIS — I5033 Acute on chronic diastolic (congestive) heart failure: Secondary | ICD-10-CM | POA: Diagnosis not present

## 2016-06-07 DIAGNOSIS — D638 Anemia in other chronic diseases classified elsewhere: Secondary | ICD-10-CM | POA: Diagnosis present

## 2016-06-07 DIAGNOSIS — I2510 Atherosclerotic heart disease of native coronary artery without angina pectoris: Secondary | ICD-10-CM | POA: Diagnosis present

## 2016-06-07 DIAGNOSIS — Y848 Other medical procedures as the cause of abnormal reaction of the patient, or of later complication, without mention of misadventure at the time of the procedure: Secondary | ICD-10-CM | POA: Diagnosis not present

## 2016-06-07 DIAGNOSIS — E1121 Type 2 diabetes mellitus with diabetic nephropathy: Secondary | ICD-10-CM | POA: Diagnosis present

## 2016-06-07 DIAGNOSIS — E8809 Other disorders of plasma-protein metabolism, not elsewhere classified: Secondary | ICD-10-CM | POA: Diagnosis present

## 2016-06-07 DIAGNOSIS — Z7982 Long term (current) use of aspirin: Secondary | ICD-10-CM

## 2016-06-07 DIAGNOSIS — N184 Chronic kidney disease, stage 4 (severe): Secondary | ICD-10-CM | POA: Diagnosis present

## 2016-06-07 DIAGNOSIS — Z833 Family history of diabetes mellitus: Secondary | ICD-10-CM

## 2016-06-07 DIAGNOSIS — Z794 Long term (current) use of insulin: Secondary | ICD-10-CM

## 2016-06-07 DIAGNOSIS — E785 Hyperlipidemia, unspecified: Secondary | ICD-10-CM | POA: Diagnosis present

## 2016-06-07 DIAGNOSIS — E039 Hypothyroidism, unspecified: Secondary | ICD-10-CM | POA: Diagnosis present

## 2016-06-07 DIAGNOSIS — D62 Acute posthemorrhagic anemia: Secondary | ICD-10-CM | POA: Diagnosis not present

## 2016-06-07 DIAGNOSIS — Z7952 Long term (current) use of systemic steroids: Secondary | ICD-10-CM

## 2016-06-07 DIAGNOSIS — J9801 Acute bronchospasm: Secondary | ICD-10-CM | POA: Diagnosis not present

## 2016-06-07 LAB — URINALYSIS COMPLETE WITH MICROSCOPIC (ARMC ONLY)
BACTERIA UA: NONE SEEN
BILIRUBIN URINE: NEGATIVE
GLUCOSE, UA: NEGATIVE mg/dL
KETONES UR: NEGATIVE mg/dL
Nitrite: NEGATIVE
PH: 6 (ref 5.0–8.0)
Protein, ur: 30 mg/dL — AB
Specific Gravity, Urine: 1.008 (ref 1.005–1.030)

## 2016-06-07 NOTE — ED Triage Notes (Signed)
Pt c/o urinary frequency for approximately 2 weeks. Pt began having pelvic and suprapubic pain since yesterday w/ low back pain. Pt denies v/d, c/o slight nausea.

## 2016-06-08 ENCOUNTER — Encounter: Payer: Self-pay | Admitting: Radiology

## 2016-06-08 ENCOUNTER — Inpatient Hospital Stay
Admission: EM | Admit: 2016-06-08 | Discharge: 2016-06-22 | DRG: 329 | Disposition: A | Discharge status: Skilled Nursing Facility | Payer: Commercial Managed Care - HMO | Attending: General Surgery | Admitting: General Surgery

## 2016-06-08 ENCOUNTER — Emergency Department: Payer: Commercial Managed Care - HMO

## 2016-06-08 DIAGNOSIS — I130 Hypertensive heart and chronic kidney disease with heart failure and stage 1 through stage 4 chronic kidney disease, or unspecified chronic kidney disease: Secondary | ICD-10-CM | POA: Diagnosis present

## 2016-06-08 DIAGNOSIS — E1122 Type 2 diabetes mellitus with diabetic chronic kidney disease: Secondary | ICD-10-CM | POA: Diagnosis present

## 2016-06-08 DIAGNOSIS — K567 Ileus, unspecified: Secondary | ICD-10-CM

## 2016-06-08 DIAGNOSIS — Z978 Presence of other specified devices: Secondary | ICD-10-CM

## 2016-06-08 DIAGNOSIS — N179 Acute kidney failure, unspecified: Secondary | ICD-10-CM | POA: Diagnosis not present

## 2016-06-08 DIAGNOSIS — M069 Rheumatoid arthritis, unspecified: Secondary | ICD-10-CM | POA: Diagnosis present

## 2016-06-08 DIAGNOSIS — T80219A Unspecified infection due to central venous catheter, initial encounter: Secondary | ICD-10-CM | POA: Diagnosis not present

## 2016-06-08 DIAGNOSIS — Z833 Family history of diabetes mellitus: Secondary | ICD-10-CM | POA: Diagnosis not present

## 2016-06-08 DIAGNOSIS — Z452 Encounter for adjustment and management of vascular access device: Secondary | ICD-10-CM

## 2016-06-08 DIAGNOSIS — Y848 Other medical procedures as the cause of abnormal reaction of the patient, or of later complication, without mention of misadventure at the time of the procedure: Secondary | ICD-10-CM | POA: Diagnosis not present

## 2016-06-08 DIAGNOSIS — M1990 Unspecified osteoarthritis, unspecified site: Secondary | ICD-10-CM | POA: Diagnosis present

## 2016-06-08 DIAGNOSIS — D62 Acute posthemorrhagic anemia: Secondary | ICD-10-CM | POA: Diagnosis not present

## 2016-06-08 DIAGNOSIS — K5720 Diverticulitis of large intestine with perforation and abscess without bleeding: Secondary | ICD-10-CM

## 2016-06-08 DIAGNOSIS — L0291 Cutaneous abscess, unspecified: Secondary | ICD-10-CM

## 2016-06-08 DIAGNOSIS — K5792 Diverticulitis of intestine, part unspecified, without perforation or abscess without bleeding: Secondary | ICD-10-CM | POA: Diagnosis present

## 2016-06-08 DIAGNOSIS — N184 Chronic kidney disease, stage 4 (severe): Secondary | ICD-10-CM | POA: Diagnosis present

## 2016-06-08 DIAGNOSIS — Z7982 Long term (current) use of aspirin: Secondary | ICD-10-CM | POA: Diagnosis not present

## 2016-06-08 DIAGNOSIS — R0602 Shortness of breath: Secondary | ICD-10-CM

## 2016-06-08 DIAGNOSIS — Z882 Allergy status to sulfonamides status: Secondary | ICD-10-CM | POA: Diagnosis not present

## 2016-06-08 DIAGNOSIS — E1121 Type 2 diabetes mellitus with diabetic nephropathy: Secondary | ICD-10-CM | POA: Diagnosis present

## 2016-06-08 DIAGNOSIS — E039 Hypothyroidism, unspecified: Secondary | ICD-10-CM | POA: Diagnosis present

## 2016-06-08 DIAGNOSIS — E785 Hyperlipidemia, unspecified: Secondary | ICD-10-CM | POA: Diagnosis present

## 2016-06-08 DIAGNOSIS — D638 Anemia in other chronic diseases classified elsewhere: Secondary | ICD-10-CM | POA: Diagnosis present

## 2016-06-08 DIAGNOSIS — I2510 Atherosclerotic heart disease of native coronary artery without angina pectoris: Secondary | ICD-10-CM | POA: Diagnosis present

## 2016-06-08 DIAGNOSIS — Z880 Allergy status to penicillin: Secondary | ICD-10-CM | POA: Diagnosis not present

## 2016-06-08 DIAGNOSIS — E8809 Other disorders of plasma-protein metabolism, not elsewhere classified: Secondary | ICD-10-CM | POA: Diagnosis present

## 2016-06-08 DIAGNOSIS — J9801 Acute bronchospasm: Secondary | ICD-10-CM | POA: Diagnosis not present

## 2016-06-08 DIAGNOSIS — K56609 Unspecified intestinal obstruction, unspecified as to partial versus complete obstruction: Secondary | ICD-10-CM

## 2016-06-08 DIAGNOSIS — Z87891 Personal history of nicotine dependence: Secondary | ICD-10-CM | POA: Diagnosis not present

## 2016-06-08 DIAGNOSIS — I5033 Acute on chronic diastolic (congestive) heart failure: Secondary | ICD-10-CM | POA: Diagnosis not present

## 2016-06-08 DIAGNOSIS — K5732 Diverticulitis of large intestine without perforation or abscess without bleeding: Secondary | ICD-10-CM | POA: Insufficient documentation

## 2016-06-08 HISTORY — DX: Heart failure, unspecified: I50.9

## 2016-06-08 HISTORY — DX: Disorder of kidney and ureter, unspecified: N28.9

## 2016-06-08 LAB — CBC
HCT: 35.4 % (ref 35.0–47.0)
HEMOGLOBIN: 11.7 g/dL — AB (ref 12.0–16.0)
MCH: 28.9 pg (ref 26.0–34.0)
MCHC: 33.1 g/dL (ref 32.0–36.0)
MCV: 87.2 fL (ref 80.0–100.0)
PLATELETS: 189 10*3/uL (ref 150–440)
RBC: 4.06 MIL/uL (ref 3.80–5.20)
RDW: 15.2 % — AB (ref 11.5–14.5)
WBC: 18.6 10*3/uL — ABNORMAL HIGH (ref 3.6–11.0)

## 2016-06-08 LAB — COMPREHENSIVE METABOLIC PANEL
ALK PHOS: 59 U/L (ref 38–126)
ALT: 11 U/L — ABNORMAL LOW (ref 14–54)
ANION GAP: 10 (ref 5–15)
AST: 23 U/L (ref 15–41)
Albumin: 4.2 g/dL (ref 3.5–5.0)
BUN: 34 mg/dL — ABNORMAL HIGH (ref 6–20)
CALCIUM: 9.8 mg/dL (ref 8.9–10.3)
CO2: 24 mmol/L (ref 22–32)
Chloride: 104 mmol/L (ref 101–111)
Creatinine, Ser: 1.82 mg/dL — ABNORMAL HIGH (ref 0.44–1.00)
GFR calc non Af Amer: 27 mL/min — ABNORMAL LOW (ref >60)
GFR, EST AFRICAN AMERICAN: 31 mL/min — AB (ref >60)
Glucose, Bld: 65 mg/dL (ref 65–99)
POTASSIUM: 3.9 mmol/L (ref 3.5–5.1)
SODIUM: 138 mmol/L (ref 135–145)
Total Bilirubin: 0.8 mg/dL (ref 0.3–1.2)
Total Protein: 8.3 g/dL — ABNORMAL HIGH (ref 6.5–8.1)

## 2016-06-08 LAB — GLUCOSE, CAPILLARY
GLUCOSE-CAPILLARY: 155 mg/dL — AB (ref 65–99)
GLUCOSE-CAPILLARY: 157 mg/dL — AB (ref 65–99)
Glucose-Capillary: 232 mg/dL — ABNORMAL HIGH (ref 65–99)
Glucose-Capillary: 210 mg/dL — ABNORMAL HIGH (ref 65–99)
Glucose-Capillary: 188 mg/dL — ABNORMAL HIGH (ref 65–99)

## 2016-06-08 MED ORDER — ENOXAPARIN SODIUM 30 MG/0.3ML ~~LOC~~ SOLN
30.0000 mg | SUBCUTANEOUS | Status: DC
  Administered 2016-06-08 – 2016-06-10 (×2): 30 mg via SUBCUTANEOUS
  Filled 2016-06-08 (×2): qty 0.3

## 2016-06-08 MED ORDER — ACETAMINOPHEN 325 MG PO TABS
650.0000 mg | ORAL_TABLET | Freq: Four times a day (QID) | ORAL | Status: DC | PRN
  Administered 2016-06-08: 650 mg via ORAL
  Filled 2016-06-08: qty 2

## 2016-06-08 MED ORDER — ONDANSETRON 4 MG PO TBDP: 4.0000 mg | ORAL_TABLET | Freq: Four times a day (QID) | ORAL | Status: DC | PRN

## 2016-06-08 MED ORDER — LACTATED RINGERS IV SOLN
INTRAVENOUS | Status: DC
  Administered 2016-06-08 (×2): via INTRAVENOUS

## 2016-06-08 MED ORDER — HYDRALAZINE HCL 20 MG/ML IJ SOLN
10.0000 mg | INTRAMUSCULAR | Status: DC | PRN
  Administered 2016-06-08: 10 mg via INTRAVENOUS
  Filled 2016-06-08: qty 1

## 2016-06-08 MED ORDER — HYDRALAZINE HCL 25 MG PO TABS
25.0000 mg | ORAL_TABLET | Freq: Three times a day (TID) | ORAL | Status: DC
  Administered 2016-06-08 – 2016-06-13 (×6): 25 mg via ORAL
  Filled 2016-06-08 (×7): qty 1

## 2016-06-08 MED ORDER — IOPAMIDOL (ISOVUE-300) INJECTION 61%
75.0000 mL | Freq: Once | INTRAVENOUS | Status: AC | PRN
  Administered 2016-06-08: 75 mL via INTRAVENOUS

## 2016-06-08 MED ORDER — AMLODIPINE BESYLATE 10 MG PO TABS
10.0000 mg | ORAL_TABLET | Freq: Every day | ORAL | Status: DC
  Administered 2016-06-08 – 2016-06-16 (×6): 10 mg via ORAL
  Filled 2016-06-08 (×6): qty 1

## 2016-06-08 MED ORDER — SODIUM CHLORIDE 0.9 % IV BOLUS (SEPSIS)
500.0000 mL | Freq: Once | INTRAVENOUS | Status: AC
  Administered 2016-06-08: 500 mL via INTRAVENOUS

## 2016-06-08 MED ORDER — METOPROLOL TARTRATE 50 MG PO TABS
50.0000 mg | ORAL_TABLET | Freq: Two times a day (BID) | ORAL | Status: DC
  Administered 2016-06-11 – 2016-06-12 (×3): 50 mg via ORAL
  Filled 2016-06-08 (×4): qty 1

## 2016-06-08 MED ORDER — POTASSIUM CHLORIDE IN NACL 20-0.45 MEQ/L-% IV SOLN
INTRAVENOUS | Status: DC
  Administered 2016-06-08: 19:00:00 via INTRAVENOUS
  Filled 2016-06-08 (×5): qty 1000

## 2016-06-08 MED ORDER — FAMOTIDINE IN NACL 20-0.9 MG/50ML-% IV SOLN
20.0000 mg | Freq: Two times a day (BID) | INTRAVENOUS | Status: DC
  Administered 2016-06-08 – 2016-06-09 (×3): 20 mg via INTRAVENOUS
  Filled 2016-06-08 (×6): qty 50

## 2016-06-08 MED ORDER — DIPHENHYDRAMINE HCL 50 MG/ML IJ SOLN: 12.5000 mg | Freq: Four times a day (QID) | INTRAMUSCULAR | Status: DC | PRN

## 2016-06-08 MED ORDER — ONDANSETRON HCL 4 MG/2ML IJ SOLN
4.0000 mg | Freq: Four times a day (QID) | INTRAMUSCULAR | Status: DC | PRN
  Administered 2016-06-08 – 2016-06-22 (×16): 4 mg via INTRAVENOUS
  Filled 2016-06-08 (×15): qty 2

## 2016-06-08 MED ORDER — MORPHINE SULFATE (PF) 4 MG/ML IV SOLN
4.0000 mg | INTRAVENOUS | Status: DC | PRN
  Administered 2016-06-08 – 2016-06-09 (×2): 4 mg via INTRAVENOUS
  Filled 2016-06-08 (×2): qty 1

## 2016-06-08 MED ORDER — DIATRIZOATE MEGLUMINE & SODIUM 66-10 % PO SOLN
15.0000 mL | Freq: Once | ORAL | Status: AC
  Administered 2016-06-08: 15 mL via ORAL

## 2016-06-08 MED ORDER — LISINOPRIL 20 MG PO TABS
20.0000 mg | ORAL_TABLET | Freq: Every day | ORAL | Status: DC
  Administered 2016-06-11 – 2016-06-14 (×3): 20 mg via ORAL
  Filled 2016-06-08 (×3): qty 1

## 2016-06-08 MED ORDER — ONDANSETRON HCL 4 MG/2ML IJ SOLN
4.0000 mg | Freq: Once | INTRAMUSCULAR | Status: AC
  Administered 2016-06-08: 4 mg via INTRAVENOUS

## 2016-06-08 MED ORDER — INSULIN ASPART 100 UNIT/ML ~~LOC~~ SOLN
0.0000 [IU] | SUBCUTANEOUS | Status: DC
  Administered 2016-06-08: 4 [IU] via SUBCUTANEOUS
  Administered 2016-06-08: 7 [IU] via SUBCUTANEOUS
  Administered 2016-06-08: 4 [IU] via SUBCUTANEOUS
  Administered 2016-06-08 – 2016-06-09 (×2): 7 [IU] via SUBCUTANEOUS
  Administered 2016-06-09: 4 [IU] via SUBCUTANEOUS
  Administered 2016-06-09: 11 [IU] via SUBCUTANEOUS
  Administered 2016-06-09 (×2): 4 [IU] via SUBCUTANEOUS
  Administered 2016-06-10: 3 [IU] via SUBCUTANEOUS
  Administered 2016-06-10: 4 [IU] via SUBCUTANEOUS
  Administered 2016-06-10 (×2): 3 [IU] via SUBCUTANEOUS
  Administered 2016-06-10: 4 [IU] via SUBCUTANEOUS
  Administered 2016-06-10: 3 [IU] via SUBCUTANEOUS
  Administered 2016-06-11: 15 [IU] via SUBCUTANEOUS
  Administered 2016-06-11 (×2): 7 [IU] via SUBCUTANEOUS
  Administered 2016-06-11: 20 [IU] via SUBCUTANEOUS
  Administered 2016-06-11 (×2): 7 [IU] via SUBCUTANEOUS
  Administered 2016-06-12: 20 [IU] via SUBCUTANEOUS
  Administered 2016-06-12 (×2): 11 [IU] via SUBCUTANEOUS
  Administered 2016-06-12: 20 [IU] via SUBCUTANEOUS
  Administered 2016-06-12: 7 [IU] via SUBCUTANEOUS
  Administered 2016-06-13: 11 [IU] via SUBCUTANEOUS
  Administered 2016-06-13: 15 [IU] via SUBCUTANEOUS
  Administered 2016-06-13: 7 [IU] via SUBCUTANEOUS
  Administered 2016-06-13: 11 [IU] via SUBCUTANEOUS
  Administered 2016-06-13: 4 [IU] via SUBCUTANEOUS
  Administered 2016-06-13: 15 [IU] via SUBCUTANEOUS
  Administered 2016-06-14 (×3): 3 [IU] via SUBCUTANEOUS
  Filled 2016-06-08: qty 15
  Filled 2016-06-08: qty 11
  Filled 2016-06-08: qty 4
  Filled 2016-06-08: qty 7
  Filled 2016-06-08: qty 3
  Filled 2016-06-08: qty 11
  Filled 2016-06-08: qty 20
  Filled 2016-06-08: qty 3
  Filled 2016-06-08: qty 4
  Filled 2016-06-08: qty 7
  Filled 2016-06-08: qty 3
  Filled 2016-06-08: qty 11
  Filled 2016-06-08: qty 20
  Filled 2016-06-08: qty 7
  Filled 2016-06-08: qty 4
  Filled 2016-06-08: qty 15
  Filled 2016-06-08: qty 3
  Filled 2016-06-08: qty 7
  Filled 2016-06-08: qty 11
  Filled 2016-06-08: qty 3
  Filled 2016-06-08: qty 20
  Filled 2016-06-08 (×2): qty 7
  Filled 2016-06-08: qty 15
  Filled 2016-06-08 (×2): qty 3
  Filled 2016-06-08 (×3): qty 7
  Filled 2016-06-08: qty 4
  Filled 2016-06-08: qty 7
  Filled 2016-06-08 (×2): qty 4
  Filled 2016-06-08: qty 11
  Filled 2016-06-08: qty 4
  Filled 2016-06-08: qty 7
  Filled 2016-06-08: qty 4
  Filled 2016-06-08: qty 7

## 2016-06-08 MED ORDER — DIPHENHYDRAMINE HCL 12.5 MG/5ML PO ELIX
12.5000 mg | ORAL_SOLUTION | Freq: Four times a day (QID) | ORAL | Status: DC | PRN
  Filled 2016-06-08: qty 5

## 2016-06-08 MED ORDER — ONDANSETRON HCL 4 MG/2ML IJ SOLN
INTRAMUSCULAR | Status: AC
  Administered 2016-06-08: 4 mg via INTRAVENOUS
  Filled 2016-06-08: qty 2

## 2016-06-08 MED ORDER — SODIUM CHLORIDE 0.9 % IV SOLN
300.0000 mg | Freq: Four times a day (QID) | INTRAVENOUS | Status: DC
  Administered 2016-06-08 – 2016-06-09 (×4): 300 mg via INTRAVENOUS
  Filled 2016-06-08 (×7): qty 300

## 2016-06-08 NOTE — H&P (Signed)
Patient ID: [name], female   DOB: 07-Feb-1946, 70 y.o.   MRN: [id number]  CC: Abdominal pain  HPI [name] is a 70 y.o. female who presents to the emergency department with a one-day history of abdominal pain. Patient reports that for the last day she's been having lower abdominal pain with soreness that is wrapped around bilateral lower quadrants. This is been associated with nausea for the last week and a general feeling of malaise. She states she's never had a pain like this before, however she thinks it's actually improved since reporting to the emergency department. Her only episode of emesis was after forcing herself to drink all of oral contrast for her CT scan. Her last bowel movement was yesterday for which she said she thought she was a little constipated. She's had some subjective chills but denies any fevers. She denies any chest pain, shortness of breath, diarrhea, urinary symptoms. She is otherwise in her usual state of health and states she's had no recent changes to her medications.  HPI  Past Medical History:  Diagnosis Date  . Arthritis    Inflammatory  . CHF (congestive heart failure) ([place])   . Chronic renal insufficiency February 07, 2015  . Coronary artery dilation ([place])   . Diabetes mellitus without complication ([place])    Type II  . Hyperlipidemia   . Hypertension   . Renal insufficiency   . Sinus problem   . Thyroid disease   . Ulcer of left lower leg [facility]) February 07, 2015    Past Surgical History:  Procedure Laterality Date  . ABDOMINAL HYSTERECTOMY    . CATARACT EXTRACTION W/PHACO Right 03/11/2015   Procedure: CATARACT EXTRACTION PHACO AND INTRAOCULAR LENS PLACEMENT (IOC);  Surgeon: Birder Robson, MD;  Location: [facility] ORS;  Service: Ophthalmology;  Laterality: Right;  [place] 00:40 AP% 24.3 CDE 9.86  . CYST EXCISION    . DILATION AND CURETTAGE OF UTERUS  1984  . I&D EXTREMITY Right 09/04/2015   Procedure: IRRIGATION AND DEBRIDEMENT EXTREMITY/ AND BIOPSY;   Surgeon: Algernon Huxley, MD;  Location: [facility] ORS;  Service: Vascular;  Laterality: Right;  . PARTIAL HYSTERECTOMY  1985  . ROTATOR CUFF REPAIR  2004    Family History  Problem Relation Age of Onset  . Diabetes Mellitus II Mother   . Diabetes Mellitus II Father     Social History Social History  Substance Use Topics  . Smoking status: Former Smoker    Years: 45.00  . Smokeless tobacco: Never Used  . Alcohol use No    Allergies  Allergen Reactions  . Doxycycline Other (See Comments)    Reaction: unknown  . Levaquin [Levofloxacin] Other (See Comments)    Causes joint pain  . Clindamycin/Lincomycin Itching    Petechia  . Penicillins Rash and Other (See Comments)    Has patient had a PCN reaction causing immediate rash, facial/tongue/throat swelling, SOB or lightheadedness with hypotension: Yes Has patient had a PCN reaction causing severe rash involving mucus membranes or skin necrosis: No Has patient had a PCN reaction that required hospitalization No Has patient had a PCN reaction occurring within the last 10 years: No If all of the above answers are "NO", then may proceed with Cephalosporin use.  . Sulfa Antibiotics Swelling and Rash    No current facility-administered medications for this encounter.    Current Outpatient Prescriptions  Medication Sig Dispense Refill  . acetaminophen (TYLENOL) 500 MG tablet Take 500 mg by mouth every 6 (six)  hours as needed for mild pain, fever or headache.     Marland Kitchen amLODipine (NORVASC) 10 MG tablet Take 10 mg by mouth daily.    Marland Kitchen aspirin 81 MG tablet Take 81 mg by mouth 2 (two) times daily.     Marland Kitchen atorvastatin (LIPITOR) 40 MG tablet Take 40 mg by mouth daily.    . furosemide (LASIX) 20 MG tablet Take 40 mg by mouth daily.     Marland Kitchen gabapentin (NEURONTIN) 100 MG capsule Take 100 mg by mouth 3 (three) times daily.    . hydrALAZINE (APRESOLINE) 25 MG tablet Take 25 mg by mouth 2 (two) times daily. Take another 25mg  if Systolic consistently over [id number]  or dystolic over 90    . hydrochlorothiazide (HYDRODIURIL) 25 MG tablet Take 25 mg by mouth daily.    . insulin aspart (NOVOLOG) 100 UNIT/ML injection Inject 12 Units into the skin 3 (three) times daily.     . insulin glargine (LANTUS) 100 UNIT/ML injection Inject 0.25 mLs (25 Units total) into the skin at bedtime. 10 mL 2  . levocetirizine (XYZAL) 5 MG tablet Take 5 mg by mouth every evening.    Marland Kitchen lisinopril (PRINIVIL,ZESTRIL) 20 MG tablet Take 20 mg by mouth daily.    Marland Kitchen LORazepam (ATIVAN) 0.5 MG tablet Take 1 tablet (0.5 mg total) by mouth every 8 (eight) hours as needed for anxiety. 30 tablet 0  . metoprolol (LOPRESSOR) 50 MG tablet Take 50 mg by mouth 2 (two) times daily.    Marland Kitchen omeprazole (PRILOSEC) 20 MG capsule Take 20 mg by mouth daily.    . pioglitazone (ACTOS) 15 MG tablet Take 15 mg by mouth daily.    . potassium chloride (K-DUR) 10 MEQ tablet Take 10 mEq by mouth daily.    . predniSONE (DELTASONE) 5 MG tablet Take 2.5 mg by mouth daily.        Review of Systems A Multi-point review of systems was asked and was negative except for the findings documented in the history of present illness  Physical Exam Blood pressure (!) 206/86, pulse 93, temperature 97.6 F (36.4 C), temperature source Oral, resp. rate 14, height 5\' 5"  (1.651 m), weight 98 kg (216 lb), SpO2 100 %. CONSTITUTIONAL: Walking around the room and then resting in bed in no acute distress. EYES: Pupils are equal, round, and reactive to light, Sclera are non-icteric. EARS, NOSE, MOUTH AND THROAT: The oropharynx is clear. The oral mucosa is pink and moist. Hearing is intact to voice. LYMPH NODES:  Lymph nodes in the neck are normal. RESPIRATORY:  Lungs are clear. There is normal respiratory effort, with equal breath sounds bilaterally, and without pathologic use of accessory muscles. CARDIOVASCULAR: Heart is regular without murmurs, gallops, or rubs. GI: The abdomen is soft, tender to palpation worst in the left lower  quadrant but without guarding or rebound, and nondistended. There are no palpable masses. There is no hepatosplenomegaly. There are normal bowel sounds in all quadrants. GU: Rectal deferred.   MUSCULOSKELETAL: Normal muscle strength and tone. No cyanosis or edema.   SKIN: Turgor is good and there are no pathologic skin lesions or ulcers. NEUROLOGIC: Motor and sensation is grossly normal. Cranial nerves are grossly intact. PSYCH:  Oriented to person, place and time. Affect is normal.  Data Reviewed Images and labs reviewed. Labs concerning for leukocytosis of 18.6, she has chronic renal insufficiency with a BUN of 34 and creatinine of 1.82 however the remainder of her labs are within normal limits. CT scan  shows evidence of sigmoid diverticulitis with perforation. There is free air within the abdomen with no obvious abscess. The air tracks up along the midline and came is seen under the diaphragm. I have personally reviewed the patient's imaging, laboratory findings and medical records.    Assessment    Perforated sigmoid diverticulitis    Plan    70 year old female with a perforated sigmoid diverticulitis. At a long conversation with the patient and her daughter about the diagnosis and the treatment options. Discussed that the majority of patients who present with this problem requiring urgent operation which would include a bowel resection and an ostomy. Discussed that this is usually temporary but if performed it is necessary as a life-saving operation. Also discussed that treatment with antibiotics could potentially provide her with relief for her body to heal all without surgery. However given the amount of air within her abdomen discussed that this may or may not be successful. Patient reports that she thinks she is feeling better and would like to try antibiotics. We will admit her to the MedSurg unit, keep nothing by mouth, provide IV hydration, IV as needed medications, IV antibiotics per  pharmacy consult, and perform serial abdominal exams. Discussed repeatedly that should she worsen at any point or fail to improve in the next 12-24 hours that she would likely require the emergent operation for bowel perforation. Patient and her daughter voiced understanding and are willing to accept this plan.     Time spent with the patient was 70 minutes, with more than 50% of the time spent in face-to-face education, counseling and care coordination.     Clayburn Pert, MD [name] General Surgeon 06/08/2016, 4:52 AM

## 2016-06-08 NOTE — Consult Note (Signed)
[place] [facility] [facility] [place] NAME: [name]    MR#:  [id number]  DATE OF BIRTH:  08-Jun-1946  DATE OF ADMISSION:  06/08/2016  PRIMARY CARE PHYSICIAN: Glendon Axe, MD   REQUESTING/REFERRING PHYSICIAN: Dr. Clayburn Pert  CHIEF COMPLAINT:   Chief Complaint  Patient presents with  . Pelvic Pain  . Back Pain    HISTORY OF PRESENT ILLNESS:  [name]  is a 70 y.o. female with a known history of Hypertension, insulin-dependent diabetes mellitus, CK D, rheumatoid arthritis, hypothyroidism presents to the hospital secondary to worsening abdominal pain and noted to have perforated sigmoid diverticulitis. Patient states her pain started yesterday with nausea but no vomiting. Presented to the emergency room today CT of the abdomen confirmed perforated sigmoid diverticulitis. Admitted to surgical service. Currently just spiked a fever. Still complaining of nausea and had an episode of vomiting now. Started on clear liquids and possibility of surgery was explained to the patient. Blood pressure has been elevated so medical consult requested for blood pressure management and diabetes management  PAST MEDICAL HISTORY:   Past Medical History:  Diagnosis Date  . Arthritis    Inflammatory  . CHF (congestive heart failure) ([place])   . Chronic renal insufficiency February 07, 2015  . Coronary artery dilation ([place])   . Diabetes mellitus without complication ([place])    Type II  . Hyperlipidemia   . Hypertension   . Renal insufficiency   . Sinus problem   . Thyroid disease   . Ulcer of left lower leg [facility]) February 07, 2015    PAST SURGICAL HISTOIRY:   Past Surgical History:  Procedure Laterality Date  . ABDOMINAL HYSTERECTOMY    . CATARACT EXTRACTION W/PHACO Right 03/11/2015   Procedure: CATARACT EXTRACTION PHACO AND INTRAOCULAR LENS PLACEMENT (IOC);  Surgeon: Birder Robson, MD;  Location: [facility] ORS;  Service: Ophthalmology;  Laterality: Right;  [place]  00:40 AP% 24.3 CDE 9.86  . CYST EXCISION    . DILATION AND CURETTAGE OF UTERUS  1984  . I&D EXTREMITY Right 09/04/2015   Procedure: IRRIGATION AND DEBRIDEMENT EXTREMITY/ AND BIOPSY;  Surgeon: Algernon Huxley, MD;  Location: [facility] ORS;  Service: Vascular;  Laterality: Right;  . PARTIAL HYSTERECTOMY  1985  . ROTATOR CUFF REPAIR  2004    SOCIAL HISTORY:   Social History  Substance Use Topics  . Smoking status: Former Smoker    Years: 45.00  . Smokeless tobacco: Never Used  . Alcohol use No    FAMILY HISTORY:   Family History  Problem Relation Age of Onset  . Diabetes Mellitus II Mother   . Diabetes Mellitus II Father     DRUG ALLERGIES:   Allergies  Allergen Reactions  . Doxycycline Other (See Comments)    Reaction: unknown  . Levaquin [Levofloxacin] Other (See Comments)    Causes joint pain  . Clindamycin/Lincomycin Itching    Petechia  . Penicillins Rash and Other (See Comments)    Has patient had a PCN reaction causing immediate rash, facial/tongue/throat swelling, SOB or lightheadedness with hypotension: Yes Has patient had a PCN reaction causing severe rash involving mucus membranes or skin necrosis: No Has patient had a PCN reaction that required hospitalization No Has patient had a PCN reaction occurring within the last 10 years: No If all of the above answers are "NO", then may proceed with Cephalosporin use.  . Sulfa Antibiotics Swelling and Rash    REVIEW OF SYSTEMS:   Review of Systems  Constitutional: Positive for chills, fever and malaise/fatigue. Negative for weight loss.  HENT: Negative for ear discharge, ear pain, hearing loss and nosebleeds.   Eyes: Negative for blurred vision, double vision and photophobia.  Respiratory: Positive for shortness of breath. Negative for cough, hemoptysis and wheezing.   Cardiovascular: Negative for chest pain, palpitations, orthopnea and leg swelling.  Gastrointestinal: Positive for abdominal pain, nausea and vomiting.  Negative for constipation, diarrhea, heartburn and melena.  Genitourinary: Negative for dysuria, frequency, hematuria and urgency.  Musculoskeletal: Negative for back pain, myalgias and neck pain.  Skin: Negative for rash.  Neurological: Negative for dizziness, tingling, tremors, sensory change, speech change, focal weakness and headaches.  Endo/Heme/Allergies: Does not bruise/bleed easily.  Psychiatric/Behavioral: Negative for depression.     MEDICATIONS AT HOME:   Prior to Admission medications   Medication Sig Start Date End Date Taking? Authorizing Provider  acetaminophen (TYLENOL) 500 MG tablet Take 500 mg by mouth every 6 (six) hours as needed for mild pain, fever or headache.    Yes Historical Provider, MD  amLODipine (NORVASC) 10 MG tablet Take 10 mg by mouth daily.   Yes Historical Provider, MD  aspirin 81 MG tablet Take 81 mg by mouth 2 (two) times daily.    Yes Historical Provider, MD  atorvastatin (LIPITOR) 40 MG tablet Take 40 mg by mouth daily.   Yes Historical Provider, MD  furosemide (LASIX) 20 MG tablet Take 40 mg by mouth daily.    Yes Historical Provider, MD  gabapentin (NEURONTIN) 100 MG capsule Take 100 mg by mouth 3 (three) times daily.   Yes Historical Provider, MD  hydrALAZINE (APRESOLINE) 25 MG tablet Take 25 mg by mouth 2 (two) times daily. Take another 25mg  if Systolic consistently over [id number] or dystolic over 90   Yes Historical Provider, MD  hydrochlorothiazide (HYDRODIURIL) 25 MG tablet Take 25 mg by mouth daily.   Yes Historical Provider, MD  insulin aspart (NOVOLOG) 100 UNIT/ML injection Inject 12 Units into the skin 3 (three) times daily.    Yes Historical Provider, MD  insulin glargine (LANTUS) 100 UNIT/ML injection Inject 0.25 mLs (25 Units total) into the skin at bedtime. 11/10/15  Yes Nicholes Mango, MD  levocetirizine (XYZAL) 5 MG tablet Take 5 mg by mouth every evening.   Yes Historical Provider, MD  lisinopril (PRINIVIL,ZESTRIL) 20 MG tablet Take 20 mg by  mouth daily.   Yes Historical Provider, MD  LORazepam (ATIVAN) 0.5 MG tablet Take 1 tablet (0.5 mg total) by mouth every 8 (eight) hours as needed for anxiety. 02/17/16 02/16/17 Yes Earleen Newport, MD  metoprolol (LOPRESSOR) 50 MG tablet Take 50 mg by mouth 2 (two) times daily.   Yes Historical Provider, MD  omeprazole (PRILOSEC) 20 MG capsule Take 20 mg by mouth daily.   Yes Historical Provider, MD  pioglitazone (ACTOS) 15 MG tablet Take 15 mg by mouth daily.   Yes Historical Provider, MD  potassium chloride (K-DUR) 10 MEQ tablet Take 10 mEq by mouth daily.   Yes Historical Provider, MD  predniSONE (DELTASONE) 5 MG tablet Take 2.5 mg by mouth daily.    Yes Historical Provider, MD      VITAL SIGNS:  Blood pressure (!) 151/57, pulse (!) 113, temperature (!) 101.4 F (38.6 C), temperature source Oral, resp. rate 18, height 5\' 5"  (1.651 m), weight 99 kg (218 lb 4.8 oz), SpO2 97 %.  PHYSICAL EXAMINATION:   Physical Exam  GENERAL:  70 y.o.-year-old patient lying in the bed with no acute  distress.  EYES: Pupils equal, round, reactive to light and accommodation. No scleral icterus. Extraocular muscles intact.  HEENT: Head atraumatic, normocephalic. Oropharynx and nasopharynx clear.  NECK:  Supple, no jugular venous distention. No thyroid enlargement, no tenderness.  LUNGS: Normal breath sounds bilaterally, no wheezing, rales,rhonchi or crepitation. No use of accessory muscles of respiration. Decreased bibasilar breath sounds CARDIOVASCULAR: S1, S2 normal. No murmurs, rubs, or gallops.  ABDOMEN: Soft, tender in both right and left lower quadrants with voluntary guarding, nondistended. Bowel sounds present. No organomegaly or mass.  EXTREMITIES: No pedal edema, cyanosis, or clubbing.  NEUROLOGIC: Cranial nerves II through XII are intact. Muscle strength 5/5 in all extremities. Sensation intact. Gait not checked.  PSYCHIATRIC: The patient is alert and oriented x 3.  SKIN: No obvious rash,  lesion, or ulcer.   LABORATORY PANEL:   CBC  Recent Labs Lab 06/08/16 0158  WBC 18.6*  HGB 11.7*  HCT 35.4  PLT 189   ------------------------------------------------------------------------------------------------------------------  Chemistries   Recent Labs Lab 06/08/16 0158  NA 138  K 3.9  CL 104  CO2 24  GLUCOSE 65  BUN 34*  CREATININE 1.82*  CALCIUM 9.8  AST 23  ALT 11*  ALKPHOS 59  BILITOT 0.8   ------------------------------------------------------------------------------------------------------------------  Cardiac Enzymes No results for input(s): TROPONINI in the last 168 hours. ------------------------------------------------------------------------------------------------------------------  RADIOLOGY:  Ct Abdomen Pelvis W Contrast  Result Date: 06/08/2016 CLINICAL DATA:  Urinary frequency for 2 weeks. Pelvic and suprapubic pain since yesterday. Low back pain. Nausea. EXAM: CT ABDOMEN AND PELVIS WITH CONTRAST TECHNIQUE: Multidetector CT imaging of the abdomen and pelvis was performed using the standard protocol following bolus administration of intravenous contrast. CONTRAST:  69mL ISOVUE-300 IOPAMIDOL (ISOVUE-300) INJECTION 61% COMPARISON:  None. FINDINGS: Dependent atelectasis in the lung bases. There is pneumoperitoneum with free intra-abdominal air throughout the abdomen and pelvis. Mild infiltration in the mesentery. Small amount of free fluid in the pelvis. Diverticula in the sigmoid colon with inflammatory stranding around the proximal sigmoid colon. Small pericolonic abscess measuring 1.4 x 2.7 cm. Changes are consistent with perforated acute diverticulitis. No small or large bowel distention. Small esophageal hiatal hernia. The liver, spleen, gallbladder, pancreas, adrenal glands, kidneys, abdominal aorta, inferior vena cava, and retroperitoneal lymph nodes are unremarkable. Pelvis: Bladder wall is not thickened. No pelvic mass or lymphadenopathy.  Appendix is normal. No destructive bone lesions. IMPRESSION: Perforated acute diverticulitis with diffuse free intraperitoneal air. Small pericolonic abscess adjacent to the junction of the descending and sigmoid colon. These results were called by telephone at the time of interpretation on 06/08/2016 at 3:07 am to Dr. Charlesetta Ivory , who verbally acknowledged these results. Electronically Signed   By: Lucienne Capers M.D.   On: 06/08/2016 03:28    EKG:   Orders placed or performed during the hospital encounter of 02/17/16  . ED EKG  . ED EKG    IMPRESSION AND PLAN:   [name]  is a 70 y.o. female with a known history of Hypertension, insulin-dependent diabetes mellitus, CK D, rheumatoid arthritis, hypothyroidism presents to the hospital secondary to worsening abdominal pain and noted to have perforated sigmoid diverticulitis.  #1 hypertension-patient has known uncontrolled hypertensive history. All her oral medications are on hold, so that's why blood pressure is elevated. -Restarted Norvasc, metoprolol, oral hydralazine and lisinopril. -Continue to hold hydrochlorothiazide and Lasix -IV hydralazine as needed.  #2 diabetes mellitus-currently nothing by mouth. So, hold her Lantus -Continue sliding scale for now. If sugars are being elevated, Lantus can  be started at a half dose  #3 CHF-patient had echocardiogram done as an outpatient for chronic exertional dyspnea and her last EF is 60-65%. -She probably has chronic diastolic CHF from uncontrolled hypertension. -Hold Lasix for now as well compensated. Decreased today to IV fluids if possible  #4 rheumatoid arthritis-follows with rheumatology. On very low-dose prednisone for maintenance and also recent vasculitis episode. Hold for now due to acute infectious episode  #5 perforated sigmoid diverticulitis-now spiking fevers, ordered blood cultures. -Continue antibiotics with Primaxin -Management per surgical team. Currently  conservative management, but might need surgery if worsens  #6 DVT prophylaxis-on Lovenox by surgical team    All the records are reviewed and case discussed with Consulting provider. Management plans discussed with the patient, family and they are in agreement.  CODE STATUS: Full code  TOTAL TIME TAKING CARE OF THIS PATIENT: 50 minutes.    Gladstone Lighter M.D on 06/08/2016 at 5:14 PM  Between 7am to 6pm - Pager - [phone]  After 6pm go to [email or web address] - password [email or web address] [facility]  Office  [phone]  CC: Primary care Physician: Glendon Axe, MD

## 2016-06-08 NOTE — ED Notes (Signed)
CT notified of pt being done with contrast and IV in place.  Ria Comment in [place] acknowledged.

## 2016-06-08 NOTE — Progress Notes (Signed)
  Visited with patient this evening. She spiked a fever this afternoon, but states she is feeling better now. When asked about her abdomen she states that she thinks it might be better now, but she is still stating that she is sore. She does say that she feels better after the fluid bolus that she received this afternoon. She has been tolerating clear liquids.  On exam she appears to be less comfortable than at the time of admission and her abdomen is soft, but is more tender in her left lower abdomen. No obvious peritonitis or guarding on exam.  Discussed with the patient and her family in the room that it is highly likely that she will need an operation. Discussed with her and her nurse that should she develop signs of peritonitis tonight that we would go to the operating room tonight. However, should she not develop peritonitis we will plan for an operation tomorrow unless she markedly improves over the next 12 hours. All questions answered to the patient and her family's satisfaction. I will post the case tonight for tomorrow but hold off on obtaining consent until the final decision to operate or not is made.  Clayburn Pert, MD [facility]

## 2016-06-08 NOTE — Progress Notes (Signed)
Admission workup and physical examination reviewed. Currently she is feeling better but still has marked left lower quadrant pain. I've independently reviewed her CT scan. I agree with our plan at this point to continue IV antibiotics but with the severity of this perforation this woman may require surgery during this admission. I talked with her at length about that option. The risks of temporary colostomy and subsequent surgery have been outlined in addition.

## 2016-06-08 NOTE — ED Provider Notes (Signed)
[facility] Emergency Department Provider Note   ____________________________________________   First MD Initiated Contact with Patient 06/08/16 0141     (approximate)  I have reviewed the triage vital signs and the nursing notes.   HISTORY  Chief Complaint Pelvic Pain and Back Pain    HPI [name] is a 70 y.o. female who comes into the hospital today with left lower quadrant abdominal pain. She reports that she was getting ready to sit up and could not due to pain. She reports that the pain moved to her mid abdomen and then move back over to her left lower quadrant. She reports that the pain started around 7 PM and just continued on. She reports that she had just eaten a sandwich prior to the pain starting. The patient rates her pain a 5 out of 10 currently. She did not take anything for pain. She reports that she thinks she may be constipated but she did have a bowel movement here. The pain had gotten better initially but then came back. She denies any blood in her stool but has had some nausea with no vomiting. She denies any fevers, chest pain, shortness of breath. The patient has never had any of these symptoms previously. She is here for evaluation.Patient also denies any back pain.   Past Medical History:  Diagnosis Date  . Arthritis    Inflammatory  . CHF (congestive heart failure) ([place])   . Chronic renal insufficiency February 07, 2015  . Coronary artery dilation ([place])   . Diabetes mellitus without complication ([place])    Type II  . Hyperlipidemia   . Hypertension   . Renal insufficiency   . Sinus problem   . Thyroid disease   . Ulcer of left lower leg [facility]) February 07, 2015    Patient Active Problem List   Diagnosis Date Noted  . Diverticulitis 06/08/2016  . Diverticulitis of large intestine with perforation and abscess without bleeding   . Demand ischemia ([place]) 11/07/2015  . Uncontrolled hypertension 11/05/2015  . Uncontrolled diabetes  mellitus ([place]) 11/05/2015  . 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 Varicose veins of left lower extremity with ulcer of calf ([place]) 08/04/2015    Past Surgical History:  Procedure Laterality Date  . ABDOMINAL HYSTERECTOMY    . CATARACT EXTRACTION W/PHACO Right 03/11/2015   Procedure: CATARACT EXTRACTION PHACO AND INTRAOCULAR LENS PLACEMENT (IOC);  Surgeon: Birder Robson, MD;  Location: [facility] ORS;  Service: Ophthalmology;  Laterality: Right;  [place] 00:40 AP% 24.3 CDE 9.86  . CYST EXCISION    . DILATION AND CURETTAGE OF UTERUS  1984  . I&D EXTREMITY Right 09/04/2015   Procedure: IRRIGATION AND DEBRIDEMENT EXTREMITY/ AND BIOPSY;  Surgeon: Algernon Huxley, MD;  Location: [facility] ORS;  Service: Vascular;  Laterality: Right;  . PARTIAL HYSTERECTOMY  1985  . ROTATOR CUFF REPAIR  2004    Prior to Admission medications   Medication Sig Start Date End Date Taking? Authorizing Provider  acetaminophen (TYLENOL) 500 MG tablet Take 500 mg by mouth every 6 (six) hours as needed for mild pain, fever or headache.    Yes Historical Provider, MD  amLODipine (NORVASC) 10 MG tablet Take 10 mg by mouth daily.   Yes Historical Provider, MD  aspirin 81 MG tablet Take 81 mg by mouth 2 (two) times daily.    Yes Historical  Provider, MD  atorvastatin (LIPITOR) 40 MG tablet Take 40 mg by mouth daily.   Yes Historical Provider, MD  furosemide (LASIX) 20 MG tablet Take 40 mg by mouth daily.    Yes Historical Provider, MD  gabapentin (NEURONTIN) 100 MG capsule Take 100 mg by mouth 3 (three) times daily.   Yes Historical Provider, MD  hydrALAZINE (APRESOLINE) 25 MG tablet Take 25 mg by mouth 2 (two) times daily. Take another 25mg  if Systolic consistently over [id number] or dystolic over 90   Yes Historical Provider, MD  hydrochlorothiazide (HYDRODIURIL) 25 MG tablet Take 25 mg by mouth daily.   Yes  Historical Provider, MD  insulin aspart (NOVOLOG) 100 UNIT/ML injection Inject 12 Units into the skin 3 (three) times daily.    Yes Historical Provider, MD  insulin glargine (LANTUS) 100 UNIT/ML injection Inject 0.25 mLs (25 Units total) into the skin at bedtime. 11/10/15  Yes Nicholes Mango, MD  levocetirizine (XYZAL) 5 MG tablet Take 5 mg by mouth every evening.   Yes Historical Provider, MD  lisinopril (PRINIVIL,ZESTRIL) 20 MG tablet Take 20 mg by mouth daily.   Yes Historical Provider, MD  LORazepam (ATIVAN) 0.5 MG tablet Take 1 tablet (0.5 mg total) by mouth every 8 (eight) hours as needed for anxiety. 02/17/16 02/16/17 Yes Earleen Newport, MD  metoprolol (LOPRESSOR) 50 MG tablet Take 50 mg by mouth 2 (two) times daily.   Yes Historical Provider, MD  omeprazole (PRILOSEC) 20 MG capsule Take 20 mg by mouth daily.   Yes Historical Provider, MD  pioglitazone (ACTOS) 15 MG tablet Take 15 mg by mouth daily.   Yes Historical Provider, MD  potassium chloride (K-DUR) 10 MEQ tablet Take 10 mEq by mouth daily.   Yes Historical Provider, MD  predniSONE (DELTASONE) 5 MG tablet Take 2.5 mg by mouth daily.    Yes Historical Provider, MD    Allergies Doxycycline; Levaquin [levofloxacin]; Clindamycin/lincomycin; Penicillins; and Sulfa antibiotics  Family History  Problem Relation Age of Onset  . Diabetes Mellitus II Mother   . Diabetes Mellitus II Father     Social History Social History  Substance Use Topics  . Smoking status: Former Smoker    Years: 45.00  . Smokeless tobacco: Never Used  . Alcohol use No    Review of Systems Constitutional: No fever/chills Eyes: No visual changes. ENT: No sore throat. Cardiovascular: Denies chest pain. Respiratory: Denies shortness of breath. Gastrointestinal:  abdominal pain.   nausea, no vomiting.  No diarrhea.  No constipation. Genitourinary: Negative for dysuria. Musculoskeletal: Negative for back pain. Skin: Negative for rash. Neurological:  Negative for headaches, focal weakness or numbness.  10-point ROS otherwise negative.  ____________________________________________   PHYSICAL EXAM:  VITAL SIGNS: ED Triage Vitals  Enc Vitals Group     BP 06/07/16 2222 (!) 190/78     Pulse Rate 06/07/16 2222 83     Resp 06/07/16 2222 20     Temp 06/07/16 2222 97.6 F (36.4 C)     Temp Source 06/07/16 2222 Oral     SpO2 06/07/16 2222 100 %     Weight 06/07/16 2224 216 lb (98 kg)     Height 06/07/16 2224 5\' 5"  (1.651 m)     Head Circumference --      Peak Flow --      Pain Score 06/07/16 2224 7     Pain Loc --      Pain Edu? --      Excl. in [place]? --  Constitutional: Alert and oriented. Well appearing and in Mild distress. Eyes: Conjunctivae are normal. PERRL. EOMI. Head: Atraumatic. Nose: No congestion/rhinnorhea. Mouth/Throat: Mucous membranes are moist.  Oropharynx non-erythematous. Cardiovascular: Normal rate, regular rhythm. Grossly normal heart sounds.  Good peripheral circulation. Respiratory: Normal respiratory effort.  No retractions. Lungs CTAB. Gastrointestinal: Soft with left lower quadrant tenderness to palpation. No distention. Positive bowel sounds Musculoskeletal: No lower extremity tenderness nor edema.   Neurologic:  Normal speech and language.  Skin:  Skin is warm, dry and intact.  Psychiatric: Mood and affect are normal.   ____________________________________________   LABS (all labs ordered are listed, but only abnormal results are displayed)  Labs Reviewed  URINALYSIS COMPLETEWITH MICROSCOPIC ([place]) - Abnormal; Notable for the following:       Result Value   Color, Urine STRAW (*)    APPearance CLEAR (*)    Hgb urine dipstick 1+ (*)    Protein, ur 30 (*)    Leukocytes, UA TRACE (*)    Squamous Epithelial / LPF 0-5 (*)    All other components within normal limits  CBC - Abnormal; Notable for the following:    WBC 18.6 (*)    Hemoglobin 11.7 (*)    RDW 15.2 (*)    All other  components within normal limits  COMPREHENSIVE METABOLIC PANEL - Abnormal; Notable for the following:    BUN 34 (*)    Creatinine, Ser 1.82 (*)    Total Protein 8.3 (*)    ALT 11 (*)    GFR calc non Af Amer 27 (*)    GFR calc Af Amer 31 (*)    All other components within normal limits   ____________________________________________  EKG  none ____________________________________________  RADIOLOGY  CT abd and pelvis ____________________________________________   PROCEDURES  Procedure(s) performed: None  Procedures  Critical Care performed: No  ____________________________________________   INITIAL IMPRESSION / ASSESSMENT AND PLAN / ED COURSE  Pertinent labs & imaging results that were available during my care of the patient were reviewed by me and considered in my medical decision making (see chart for details).  This is a 70 year old female who comes into the hospital today with some left lower quadrant abdominal pain. The patient says the pain has been going on since this evening. I will check some blood work and sent the patient for a CT to evaluate for possible diverticulitis. The patient's urinalysis is negative. She had previously said that she had some urinary frequency for multiple weeks but she thinks that this is something different.  Clinical Course  Value Comment By Time  CT Abdomen Pelvis W Contrast Perforated acute diverticulitis with diffuse free intraperitoneal air. Small pericolonic abscess adjacent to the junction of the descending and sigmoid colon. Loney Hering, MD 08/15 [phone]    I received a call from the radiologist that the patient has a perforated acute diverticulitis with abscess and free air in the belly. I contacted Dr. Adonis Huguenin who came down to see the patient and will admit her. He will order antibiotics and at this time he is unsure if the patient may need surgery. The patient did have some vomiting after her contrast and received a dose  of Zofran. She will be admitted to the surgical service.   ____________________________________________   FINAL CLINICAL IMPRESSION(S) / ED DIAGNOSES  Final diagnoses:  Diverticulitis of large intestine with perforation and abscess without bleeding      NEW MEDICATIONS STARTED DURING THIS VISIT:  New Prescriptions  No medications on file     Note:  This document was prepared using Dragon voice recognition software and may include unintentional dictation errors.    Loney Hering, MD 06/08/16 [phone]

## 2016-06-08 NOTE — Progress Notes (Signed)
Pharmacy Antibiotic Note  [name] is a 70 y.o. female admitted on 06/08/2016 with IAI.  Pharmacy has been consulted for Primaxin dosing.  Plan: Primaxin 300 mg IV Q6H (CrCl 30 to 60 mL/min). Monitor closely for allergic response.   Height: 5\' 5"  (165.1 cm) Weight: 216 lb (98 kg) IBW/kg (Calculated) : 57  Temp (24hrs), Avg:97.6 F (36.4 C), Min:97.6 F (36.4 C), Max:97.6 F (36.4 C)   Recent Labs Lab 06/08/16 0158  WBC 18.6*  CREATININE 1.82*    Estimated Creatinine Clearance: 33.3 mL/min (by C-G formula based on SCr of 1.82 mg/dL).    Allergies  Allergen Reactions  . Doxycycline Other (See Comments)    Reaction: unknown  . Levaquin [Levofloxacin] Other (See Comments)    Causes joint pain  . Clindamycin/Lincomycin Itching    [place]  . Penicillins Rash and Other (See Comments)    Has patient had a PCN reaction causing immediate rash, facial/tongue/throat swelling, SOB or lightheadedness with hypotension: Yes Has patient had a PCN reaction causing severe rash involving mucus membranes or skin necrosis: No Has patient had a PCN reaction that required hospitalization No Has patient had a PCN reaction occurring within the last 10 years: No If all of the above answers are "NO", then may proceed with Cephalosporin use.  . Sulfa Antibiotics Swelling and Rash    Thank you for allowing pharmacy to be a part of this patient's care.  Laural Benes, Pharm.D., BCPS Clinical Pharmacist 06/08/2016 5:21 AM

## 2016-06-08 NOTE — Progress Notes (Signed)
Notified Dr. Pat Patrick that pt has fever. MD to place orders will continue to monitor

## 2016-06-08 NOTE — ED Notes (Signed)
Pt up to have bowel movement at this time.

## 2016-06-09 ENCOUNTER — Inpatient Hospital Stay: Payer: Commercial Managed Care - HMO | Admitting: Anesthesiology

## 2016-06-09 ENCOUNTER — Encounter
Admission: EM | Disposition: A | Discharge status: Skilled Nursing Facility | Payer: Self-pay | Source: Home / Self Care | Attending: General Surgery

## 2016-06-09 ENCOUNTER — Inpatient Hospital Stay: Payer: Commercial Managed Care - HMO

## 2016-06-09 DIAGNOSIS — K5720 Diverticulitis of large intestine with perforation and abscess without bleeding: Principal | ICD-10-CM

## 2016-06-09 HISTORY — PX: COLOSTOMY: SHX63

## 2016-06-09 HISTORY — PX: COLON RESECTION SIGMOID: SHX6737

## 2016-06-09 HISTORY — PX: CENTRAL VENOUS CATHETER INSERTION: SHX401

## 2016-06-09 LAB — PHOSPHORUS: PHOSPHORUS: 3.3 mg/dL (ref 2.5–4.6)

## 2016-06-09 LAB — GLUCOSE, CAPILLARY
GLUCOSE-CAPILLARY: 172 mg/dL — AB (ref 65–99)
Glucose-Capillary: 150 mg/dL — ABNORMAL HIGH (ref 65–99)
Glucose-Capillary: 160 mg/dL — ABNORMAL HIGH (ref 65–99)
Glucose-Capillary: 222 mg/dL — ABNORMAL HIGH (ref 65–99)
Glucose-Capillary: 224 mg/dL — ABNORMAL HIGH (ref 65–99)
Glucose-Capillary: 254 mg/dL — ABNORMAL HIGH (ref 65–99)
Glucose-Capillary: 262 mg/dL — ABNORMAL HIGH (ref 65–99)

## 2016-06-09 LAB — BASIC METABOLIC PANEL
ANION GAP: 11 (ref 5–15)
BUN: 37 mg/dL — ABNORMAL HIGH (ref 6–20)
CHLORIDE: 103 mmol/L (ref 101–111)
CO2: 23 mmol/L (ref 22–32)
CREATININE: 2.39 mg/dL — AB (ref 0.44–1.00)
Calcium: 9 mg/dL (ref 8.9–10.3)
GFR calc non Af Amer: 19 mL/min — ABNORMAL LOW (ref >60)
GFR, EST AFRICAN AMERICAN: 23 mL/min — AB (ref >60)
Glucose, Bld: 193 mg/dL — ABNORMAL HIGH (ref 65–99)
POTASSIUM: 3.7 mmol/L (ref 3.5–5.1)
SODIUM: 137 mmol/L (ref 135–145)

## 2016-06-09 LAB — CBC
HCT: 30.3 % — ABNORMAL LOW (ref 35.0–47.0)
HEMOGLOBIN: 9.8 g/dL — AB (ref 12.0–16.0)
MCH: 28.8 pg (ref 26.0–34.0)
MCHC: 32.2 g/dL (ref 32.0–36.0)
MCV: 89.5 fL (ref 80.0–100.0)
Platelets: 200 10*3/uL (ref 150–440)
RBC: 3.39 MIL/uL — AB (ref 3.80–5.20)
RDW: 15.4 % — ABNORMAL HIGH (ref 11.5–14.5)
WBC: 20.1 10*3/uL — AB (ref 3.6–11.0)

## 2016-06-09 LAB — MAGNESIUM: MAGNESIUM: 1.5 mg/dL — AB (ref 1.7–2.4)

## 2016-06-09 SURGERY — COLECTOMY, SIGMOID, OPEN
Anesthesia: General | Laterality: Right | Wound class: Dirty or Infected

## 2016-06-09 MED ORDER — HYDROMORPHONE 1 MG/ML IV SOLN
INTRAVENOUS | Status: DC
  Administered 2016-06-09: 17:00:00 via INTRAVENOUS
  Administered 2016-06-09: 1.1 mg via INTRAVENOUS
  Administered 2016-06-10: 1.29 mg via INTRAVENOUS
  Administered 2016-06-10: 0.3 mg via INTRAVENOUS
  Administered 2016-06-10: 1.2 mg via INTRAVENOUS
  Administered 2016-06-10: 1.5 mg via INTRAVENOUS
  Administered 2016-06-10: 1.8 mg via INTRAVENOUS
  Administered 2016-06-10: 1.5 mg via INTRAVENOUS
  Administered 2016-06-11: 0.3 mg via INTRAVENOUS
  Filled 2016-06-09: qty 25

## 2016-06-09 MED ORDER — POTASSIUM CHLORIDE IN NACL 20-0.45 MEQ/L-% IV SOLN
INTRAVENOUS | Status: DC
  Administered 2016-06-09: 17:00:00 via INTRAVENOUS
  Filled 2016-06-09 (×6): qty 1000

## 2016-06-09 MED ORDER — GLYCOPYRROLATE 0.2 MG/ML IJ SOLN
INTRAMUSCULAR | Status: DC | PRN
  Administered 2016-06-09: 0.6 mg via INTRAVENOUS

## 2016-06-09 MED ORDER — MIDAZOLAM HCL 2 MG/2ML IJ SOLN
INTRAMUSCULAR | Status: DC | PRN
  Administered 2016-06-09 (×2): 1 mg via INTRAVENOUS

## 2016-06-09 MED ORDER — FENTANYL CITRATE (PF) 100 MCG/2ML IJ SOLN
INTRAMUSCULAR | Status: DC | PRN
  Administered 2016-06-09 (×7): 50 ug via INTRAVENOUS
  Administered 2016-06-09: 100 ug via INTRAVENOUS

## 2016-06-09 MED ORDER — LABETALOL HCL 5 MG/ML IV SOLN
INTRAVENOUS | Status: DC | PRN
  Administered 2016-06-09: 10 mg via INTRAVENOUS

## 2016-06-09 MED ORDER — GLUCAGON HCL RDNA (DIAGNOSTIC) 1 MG IJ SOLR
INTRAMUSCULAR | Status: DC | PRN
  Administered 2016-06-09: 1 mg via INTRAVENOUS

## 2016-06-09 MED ORDER — ONDANSETRON HCL 4 MG/2ML IJ SOLN: 4.0000 mg | Freq: Four times a day (QID) | INTRAMUSCULAR | Status: DC | PRN

## 2016-06-09 MED ORDER — SODIUM CHLORIDE 0.9 % IV BOLUS (SEPSIS)
500.0000 mL | Freq: Once | INTRAVENOUS | Status: AC
  Administered 2016-06-09: 500 mL via INTRAVENOUS

## 2016-06-09 MED ORDER — LIDOCAINE HCL (CARDIAC) 20 MG/ML IV SOLN
INTRAVENOUS | Status: DC | PRN
  Administered 2016-06-09: 40 mg via INTRAVENOUS

## 2016-06-09 MED ORDER — DIPHENHYDRAMINE HCL 50 MG/ML IJ SOLN: 12.5000 mg | Freq: Four times a day (QID) | INTRAMUSCULAR | Status: DC | PRN

## 2016-06-09 MED ORDER — SODIUM CHLORIDE 0.9% FLUSH: 9.0000 mL | INTRAVENOUS | Status: DC | PRN

## 2016-06-09 MED ORDER — ONDANSETRON HCL 4 MG/2ML IJ SOLN
INTRAMUSCULAR | Status: AC
  Administered 2016-06-09: 4 mg via INTRAVENOUS
  Filled 2016-06-09: qty 2

## 2016-06-09 MED ORDER — SODIUM CHLORIDE 0.9 % IV SOLN
INTRAVENOUS | Status: DC | PRN
  Administered 2016-06-09 (×4): via INTRAVENOUS

## 2016-06-09 MED ORDER — SODIUM CHLORIDE 0.9 % IV SOLN
INTRAVENOUS | Status: DC | PRN
  Administered 2016-06-09: 09:00:00 via INTRAVENOUS

## 2016-06-09 MED ORDER — GLUCAGON HCL RDNA (DIAGNOSTIC) 1 MG IJ SOLR
INTRAMUSCULAR | Status: AC
  Filled 2016-06-09: qty 1

## 2016-06-09 MED ORDER — FENTANYL CITRATE (PF) 100 MCG/2ML IJ SOLN: 25.0000 ug | INTRAMUSCULAR | Status: DC | PRN

## 2016-06-09 MED ORDER — ROCURONIUM BROMIDE 100 MG/10ML IV SOLN
INTRAVENOUS | Status: DC | PRN
  Administered 2016-06-09: 5 mg via INTRAVENOUS
  Administered 2016-06-09: 45 mg via INTRAVENOUS
  Administered 2016-06-09 (×5): 10 mg via INTRAVENOUS

## 2016-06-09 MED ORDER — ONDANSETRON HCL 4 MG/2ML IJ SOLN
4.0000 mg | Freq: Once | INTRAMUSCULAR | Status: AC | PRN
  Administered 2016-06-09: 4 mg via INTRAVENOUS

## 2016-06-09 MED ORDER — PROPOFOL 10 MG/ML IV BOLUS
INTRAVENOUS | Status: DC | PRN
  Administered 2016-06-09: 20 mg via INTRAVENOUS
  Administered 2016-06-09: 100 mg via INTRAVENOUS

## 2016-06-09 MED ORDER — NEOSTIGMINE METHYLSULFATE 10 MG/10ML IV SOLN
INTRAVENOUS | Status: DC | PRN
  Administered 2016-06-09: 5 mg via INTRAVENOUS

## 2016-06-09 MED ORDER — SODIUM CHLORIDE 0.9 % IV SOLN
500.0000 mg | Freq: Two times a day (BID) | INTRAVENOUS | Status: DC
  Administered 2016-06-09 (×2): 500 mg via INTRAVENOUS
  Filled 2016-06-09 (×4): qty 500

## 2016-06-09 MED ORDER — ESMOLOL HCL 100 MG/10ML IV SOLN
INTRAVENOUS | Status: DC | PRN
  Administered 2016-06-09: 20 mg via INTRAVENOUS
  Administered 2016-06-09: 10 mg via INTRAVENOUS

## 2016-06-09 MED ORDER — NALOXONE HCL 0.4 MG/ML IJ SOLN: 0.4000 mg | INTRAMUSCULAR | Status: DC | PRN

## 2016-06-09 MED ORDER — PHENYLEPHRINE HCL 10 MG/ML IJ SOLN
INTRAMUSCULAR | Status: DC | PRN
  Administered 2016-06-09: 50 ug via INTRAVENOUS
  Administered 2016-06-09 (×2): 100 ug via INTRAVENOUS
  Administered 2016-06-09: 200 ug via INTRAVENOUS
  Administered 2016-06-09: 100 ug via INTRAVENOUS

## 2016-06-09 MED ORDER — DIPHENHYDRAMINE HCL 12.5 MG/5ML PO ELIX
12.5000 mg | ORAL_SOLUTION | Freq: Four times a day (QID) | ORAL | Status: DC | PRN
  Filled 2016-06-09: qty 5

## 2016-06-09 MED ORDER — SUCCINYLCHOLINE CHLORIDE 20 MG/ML IJ SOLN
INTRAMUSCULAR | Status: DC | PRN
  Administered 2016-06-09: 100 mg via INTRAVENOUS

## 2016-06-09 SURGICAL SUPPLY — 67 items
BARRIER SKIN 2 3/4 (OSTOMY) ×3 IMPLANT
BARRIER SKIN 2 3/4 INCH (OSTOMY) ×1
CANISTER SUCT 1200ML W/VALVE (MISCELLANEOUS) ×4 IMPLANT
CATH TRAY 16F METER LATEX (MISCELLANEOUS) ×4 IMPLANT
CLIP TI LARGE 6 (CLIP) ×4 IMPLANT
CLIP TI MEDIUM 6 (CLIP) ×4 IMPLANT
CLOSURE WOUND 1/2 X4 (GAUZE/BANDAGES/DRESSINGS)
COVER CLAMP SIL LG PBX B (MISCELLANEOUS) ×8 IMPLANT
COVER PROBE FLX POLY STRL (MISCELLANEOUS) ×4 IMPLANT
DRAIN PENROSE 5/8X18 LTX STRL (WOUND CARE) ×4 IMPLANT
DRAPE INCISE IOBAN 66X45 STRL (DRAPES) ×4 IMPLANT
DRAPE LAPAROTOMY 100X77 ABD (DRAPES) ×4 IMPLANT
DRAPE PERI LITHO V/GYN (MISCELLANEOUS) IMPLANT
DRAPE TABLE BACK 80X90 (DRAPES) ×4 IMPLANT
DRSG OPSITE POSTOP 4X10 (GAUZE/BANDAGES/DRESSINGS) IMPLANT
DRSG OPSITE POSTOP 4X8 (GAUZE/BANDAGES/DRESSINGS) IMPLANT
ELECT BLADE 6.5 EXT (BLADE) ×4 IMPLANT
ELECT CAUTERY NEEDLE TIP 1.0 (MISCELLANEOUS) ×4
ELECT REM PT RETURN 9FT ADLT (ELECTROSURGICAL) ×4
ELECTRODE CAUTERY NEDL TIP 1.0 (MISCELLANEOUS) ×2 IMPLANT
ELECTRODE REM PT RTRN 9FT ADLT (ELECTROSURGICAL) ×2 IMPLANT
GAUZE SPONGE 4X4 12PLY STRL (GAUZE/BANDAGES/DRESSINGS) ×4 IMPLANT
GLOVE BIO SURGEON STRL SZ7.5 (GLOVE) ×16 IMPLANT
GLOVE INDICATOR 8.0 STRL GRN (GLOVE) ×16 IMPLANT
GOWN STRL REUS W/ TWL LRG LVL3 (GOWN DISPOSABLE) ×12 IMPLANT
GOWN STRL REUS W/TWL LRG LVL3 (GOWN DISPOSABLE) ×12
HANDLE YANKAUER SUCT BULB TIP (MISCELLANEOUS) ×4 IMPLANT
KIT CATH CVC 3 LUMEN 7FR 8IN (MISCELLANEOUS) ×4 IMPLANT
KIT RM TURNOVER STRD PROC AR (KITS) ×4 IMPLANT
LABEL OR SOLS (LABEL) ×4 IMPLANT
LIGASURE MARYLAND LAP STAND (ELECTROSURGICAL) ×4 IMPLANT
LOOP OSTOMY BRIDGE (OSTOMY) ×4 IMPLANT
NS IRRIG 1000ML POUR BTL (IV SOLUTION) ×8 IMPLANT
NS IRRIG 500ML POUR BTL (IV SOLUTION) ×4 IMPLANT
PACK BASIN MAJOR ARMC (MISCELLANEOUS) ×4 IMPLANT
PACK COLON CLEAN CLOSURE (MISCELLANEOUS) ×4 IMPLANT
PAD ABD DERMACEA PRESS 5X9 (GAUZE/BANDAGES/DRESSINGS) IMPLANT
SPONGE LAP 18X18 5 PK (GAUZE/BANDAGES/DRESSINGS) ×4 IMPLANT
STAPLER CUT CVD 40MM GREEN (STAPLE) ×4 IMPLANT
STAPLER CUT RELOAD GREEN (STAPLE) ×8 IMPLANT
STAPLER PROX 25M (MISCELLANEOUS) ×4 IMPLANT
STAPLER SKIN PROX 35W (STAPLE) ×4 IMPLANT
STRIP CLOSURE SKIN 1/2X4 (GAUZE/BANDAGES/DRESSINGS) IMPLANT
SURGILUBE 2OZ TUBE FLIPTOP (MISCELLANEOUS) ×4 IMPLANT
SUT BOLSTER W/RETENTN TUBE (SUTURE) IMPLANT
SUT ETHILON 3-0 FS-10 30 BLK (SUTURE) ×4
SUT MAXON ABS #0 GS21 30IN (SUTURE) IMPLANT
SUT NYLON 2-0 (SUTURE) ×4 IMPLANT
SUT PDS AB 1 TP1 96 (SUTURE) ×8 IMPLANT
SUT PROLENE 2 TP 1 (SUTURE) IMPLANT
SUT SILK 2 0 (SUTURE) ×2
SUT SILK 2-0 18XBRD TIE 12 (SUTURE) ×2 IMPLANT
SUT SILK 3 0 (SUTURE)
SUT SILK 3-0 (SUTURE) ×2
SUT SILK 3-0 18XBRD TIE 12 (SUTURE) IMPLANT
SUT SILK 3-0 SH-1 18XCR BRD (SUTURE) ×2
SUT SILK 4 0 (SUTURE)
SUT SILK 4-0 18XBRD TIE 12 (SUTURE) IMPLANT
SUT TICRON 2-0 30IN 311381 (SUTURE) IMPLANT
SUT VIC AB 3-0 SH 27 (SUTURE) ×12
SUT VIC AB 3-0 SH 27X BRD (SUTURE) ×12 IMPLANT
SUT VIC AB 4-0 FS2 27 (SUTURE) ×8 IMPLANT
SUTURE EHLN 3-0 FS-10 30 BLK (SUTURE) ×2 IMPLANT
SUTURE SILK 3-0 SH-1 18XCR BRD (SUTURE) ×2 IMPLANT
SWAB CULTURE AMIES ANAERIB BLU (MISCELLANEOUS) ×4 IMPLANT
SYR BULB EAR ULCER 3OZ GRN STR (SYRINGE) ×4 IMPLANT
TOWEL OR 17X26 4PK STRL BLUE (TOWEL DISPOSABLE) ×4 IMPLANT

## 2016-06-09 NOTE — Anesthesia Preprocedure Evaluation (Addendum)
Anesthesia Evaluation  Patient identified by MRN, date of birth, ID band Patient awake    Reviewed: Allergy & Precautions, H&P , NPO status , Patient's Chart, lab work & pertinent test results, reviewed documented beta blocker date and time   History of Anesthesia Complications Negative for: history of anesthetic complications  Airway Mallampati: III  TM Distance: >3 FB Neck ROM: full    Dental  (+) Upper Dentures, Lower Dentures, Edentulous Upper, Edentulous Lower   Pulmonary neg pulmonary ROS, neg COPD, former smoker,    Pulmonary exam normal breath sounds clear to auscultation- rhonchi (-) wheezing      Cardiovascular hypertension, On Medications + Peripheral Vascular Disease and +CHF  negative cardio ROS Normal cardiovascular exam Rhythm:Regular Rate:Normal + Systolic murmurs (SEM grade 2)    Neuro/Psych negative neurological ROS  negative psych ROS   GI/Hepatic negative GI ROS, Neg liver ROS,   Endo/Other  negative endocrine ROSdiabetes, Type 2, Insulin Dependent  Renal/GU Renal InsufficiencyRenal diseasenegative Renal ROS  negative genitourinary   Musculoskeletal  (+) Arthritis ,   Abdominal (+) + obese,   Peds  Hematology negative hematology ROS (+)   Anesthesia Other Findings Past Medical History: No date: Arthritis     Comment: Inflammatory No date: CHF (congestive heart failure) ([place]) February 07, 2015: Chronic renal insufficiency No date: Coronary artery dilation (H[facility]) No date: Diabetes mellitus without complication ([facility])     Comment: Type II No date: Hyperlipidemia No date: Hypertension No date: Renal insufficiency No date: Sinus problem No date: Thyroid disease February 07, 2015: Ulcer of left lower leg [facility]) Past Surgical History: No date: ABDOMINAL HYSTERECTOMY 03/11/2015: CATARACT EXTRACTION W/PHACO Right     Comment: Procedure: CATARACT EXTRACTION PHACO AND               INTRAOCULAR LENS  PLACEMENT (IOC);  Surgeon:               Birder Robson, MD;  Location: [facility] ORS;                Service: Ophthalmology;  Laterality: Right;  [place]              00:40 AP% 24.3 CDE 9.86 No date: CYST EXCISION 1984: DILATION AND CURETTAGE OF UTERUS 09/04/2015: I&D EXTREMITY Right     Comment: Procedure: IRRIGATION AND DEBRIDEMENT               EXTREMITY/ AND BIOPSY;  Surgeon: Algernon Huxley,               MD;  Location: [facility] ORS;  Service: Vascular;                Laterality: Right; 1985: PARTIAL HYSTERECTOMY 2004: ROTATOR CUFF REPAIR BMI    Body Mass Index:  36.33 kg/m     Reproductive/Obstetrics negative OB ROS                            Anesthesia Physical Anesthesia Plan  ASA: III and emergent  Anesthesia Plan: General ETT   Post-op Pain Management:    Induction:   Airway Management Planned: Oral ETT  Additional Equipment:   Intra-op Plan:   Post-operative Plan: Extubation in OR  Informed Consent: I have reviewed the patients History and Physical, chart, labs and discussed the procedure including the risks, benefits and alternatives for the proposed anesthesia with the patient or authorized representative who has indicated his/her understanding and acceptance.   Dental  Advisory Given  Plan Discussed with: CRNA and Anesthesiologist  Anesthesia Plan Comments:        Anesthesia Quick Evaluation

## 2016-06-09 NOTE — Progress Notes (Signed)
Subjective:   She is feeling better this morning with regard to her fever but is still nauseated and markedly uncomfortable. She's not voided significantly overnight and her creatinine is elevated despite fluid boluses. Her electrolytes remained stable.  Vital signs in last 24 hours: Temp:  [97.4 F (36.3 C)-101.4 F (38.6 C)] 98.8 F (37.1 C) (08/16 0444) Pulse Rate:  [61-114] 107 (08/16 0444) Resp:  [18-20] 20 (08/16 0444) BP: (119-162)/(47-69) 152/65 (08/16 0444) SpO2:  [93 %-98 %] 95 % (08/16 0444) Last BM Date: 06/08/16  Intake/Output from previous day: 08/15 0701 - 08/16 0700 In: 2096.3 [P.O.:118; I.V.:1978.3] Out: 200 [Urine:200]  Exam:  She still has marked left lower quadrant tenderness with significant guarding. She appears to have more discomfort this morning she did yesterday morning. She's breathing easily and she is afebrile. She has no wheezing or shortness of breath.  Lab Results:  CBC  Recent Labs  06/08/16 0158 06/09/16 0510  WBC 18.6* 20.1*  HGB 11.7* 9.8*  HCT 35.4 30.3*  PLT 189 200   CMP     Component Value Date/Time   NA 137 06/09/2016 0510   NA 145 05/22/2014 0712   K 3.7 06/09/2016 0510   K 3.9 05/22/2014 0712   CL 103 06/09/2016 0510   CL 110 (H) 05/22/2014 0712   CO2 23 06/09/2016 0510   CO2 29 05/22/2014 0712   GLUCOSE 193 (H) 06/09/2016 0510   GLUCOSE 61 (L) 05/22/2014 0712   BUN 37 (H) 06/09/2016 0510   BUN 28 (H) 05/22/2014 0712   CREATININE 2.39 (H) 06/09/2016 0510   CREATININE 1.75 (H) 05/22/2014 0712   CALCIUM 9.0 06/09/2016 0510   CALCIUM 7.9 (L) 05/22/2014 0712   PROT 8.3 (H) 06/08/2016 0158   PROT 6.6 05/20/2014 1246   ALBUMIN 4.2 06/08/2016 0158   ALBUMIN 3.0 (L) 05/20/2014 1246   AST 23 06/08/2016 0158   AST 14 (L) 05/20/2014 1246   ALT 11 (L) 06/08/2016 0158   ALT 27 05/20/2014 1246   ALKPHOS 59 06/08/2016 0158   ALKPHOS 54 05/20/2014 1246   BILITOT 0.8 06/08/2016 0158   BILITOT 0.6 05/20/2014 1246   GFRNONAA 19  (L) 06/09/2016 0510   GFRNONAA 29 (L) 05/22/2014 0712   GFRAA 23 (L) 06/09/2016 0510   GFRAA 34 (L) 05/22/2014 0712   PT/INR No results for input(s): LABPROT, INR in the last 72 hours.  Studies/Results: Ct Abdomen Pelvis W Contrast  Result Date: 06/08/2016 CLINICAL DATA:  Urinary frequency for 2 weeks. Pelvic and suprapubic pain since yesterday. Low back pain. Nausea. EXAM: CT ABDOMEN AND PELVIS WITH CONTRAST TECHNIQUE: Multidetector CT imaging of the abdomen and pelvis was performed using the standard protocol following bolus administration of intravenous contrast. CONTRAST:  38mL ISOVUE-300 IOPAMIDOL (ISOVUE-300) INJECTION 61% COMPARISON:  None. FINDINGS: Dependent atelectasis in the lung bases. There is pneumoperitoneum with free intra-abdominal air throughout the abdomen and pelvis. Mild infiltration in the mesentery. Small amount of free fluid in the pelvis. Diverticula in the sigmoid colon with inflammatory stranding around the proximal sigmoid colon. Small pericolonic abscess measuring 1.4 x 2.7 cm. Changes are consistent with perforated acute diverticulitis. No small or large bowel distention. Small esophageal hiatal hernia. The liver, spleen, gallbladder, pancreas, adrenal glands, kidneys, abdominal aorta, inferior vena cava, and retroperitoneal lymph nodes are unremarkable. Pelvis: Bladder wall is not thickened. No pelvic mass or lymphadenopathy. Appendix is normal. No destructive bone lesions. IMPRESSION: Perforated acute diverticulitis with diffuse free intraperitoneal air. Small pericolonic abscess adjacent to  the junction of the descending and sigmoid colon. These results were called by telephone at the time of interpretation on 06/08/2016 at 3:07 am to Dr. Charlesetta Ivory , who verbally acknowledged these results. Electronically Signed   By: Lucienne Capers M.D.   On: 06/08/2016 03:28    Assessment/Plan: I think we have given her an adequate trial of antibiotic therapy and with the  significant amount of intra-abdominal free air I'm concerned about a large perforation. Because of her fever renal insufficiency continued abdominal discomfort we will pursue surgical interventions morning. I discussed this plan with the patient and she is in agreement. Daughter was present for the interview. We plan to proceed with sigmoid colectomy probable diversion either end colostomy or loop ileostomy. Risks benefits and options of been outlined and accepted.

## 2016-06-09 NOTE — Anesthesia Procedure Notes (Signed)
Procedure Name: Intubation Date/Time: 06/09/2016 9:15 AM Performed by: Allean Found Pre-anesthesia Checklist: Patient identified, Emergency Drugs available, Suction available, Patient being monitored and Timeout performed Patient Re-evaluated:Patient Re-evaluated prior to inductionOxygen Delivery Method: Circle system utilized Preoxygenation: Pre-oxygenation with 100% oxygen Intubation Type: IV induction and Rapid sequence Laryngoscope Size: Mac and 3 Grade View: Grade I Tube type: Oral Tube size: 7.0 mm Number of attempts: 1 Airway Equipment and Method: Stylet Placement Confirmation: ETT inserted through vocal cords under direct vision Secured at: 21 cm Tube secured with: Tape Dental Injury: Teeth and Oropharynx as per pre-operative assessment  Future Recommendations: Recommend- induction with short-acting agent, and alternative techniques readily available

## 2016-06-09 NOTE — Progress Notes (Signed)
With the patient in the supine position and after induction of appropriate general anesthesia the patient was prepped ChloraPrep and draped sterile towels. An alcohol wipe and Betadine impregnated Steri-Drape are utilized.  A lower midline incision was made just above the umbilicus to suprapubic area and carried down through the substantial subcutaneous tissue using Bovie cautery. Midline fascia identified and opened length of the skin incision. 5 pus was encountered in the pelvic area which was cultured. The Omni-Tract retractor was then placed in the bowel packed into the upper abdomen. The distal descending colon and proximal sigmoid colon appeared to be involved in the inflammatory change. There was no free stool noted in the abdominal cavity.  The lateral attachments to the sigmoid and descending colon were taken down with combination of blunt and sharp dissection. The segment of bowel involvement was mobilized dividing the mesentery with the LigaSure device. The proximal sigmoid colon was divided with a single application of the contour stapling device. The descending colon was then divided in similar fashion and the specimen passed off the table. The descending colon was mobilized to about 10 cm up the lateral gutter. There didn't appear to be plenty of sigmoid colon. I anticipated and and end anastomosis using an ILS device.  The proximal bowel was cleaned at its staple line and a pursestring clamp placed across the bowel. 2-0 nylon pursestring suture was then placed through the pursestring clamp and the staple line excised. The bowel was dilated to a 25 mm sizer. The distal bowel was cleaned along the staple line. My plan was to try this spike out through the staple line and attached to the anvil. The anvil was then placed into the proximal bowel and secured with the pursestring. The pursestring appeared to be intact and no portion of the anvil was visible. The distal colotomy was performed  longitudinally in the 25 mm ILS device brought to the table. However, it could not be inserted into the distal bowel because of bowel spasm. It was a very tight fit. Milligram of glucagon was given without any improvement in the spasm. Reason because of redundant distal colon staple was passed in the other direction brought out through the posterior wall of the mid sigmoid and mammary to the anvil. Stapler was approximated and fired.  The staple lines were reviewed and appear to be intact with no bradykinesia 1. The mesentery of the proximal distal sigmoid was divided with the ligature and then the remaining colon where the colotomy had been performed was divided using another application of the contour stapler. The specimen passed off the table. Stabilized then oversewn with 3-0 silk in a seromuscular fashion and some of these appendices epiploica were placed over the anastomosis suture placed with 3-0 nylon. The area was then copiously suction irrigated.  A segment of ileum approximately 12-16 inches from the ileocecal valve was chosen for the ileostomy. A right lower quadrant incision was made in the standard fashion and a window created for the ileostomy. As it turned out this portion of the operations were the most difficult because of the substantial subcutaneous tissue quite a large tract to be created in order to bring the ileum into the ostomy site. A bridge was placed underneath the bowel through the mesentery. The abdomen was then irrigated. A clean closure was undertaken using loop PDS in a running fashion starting at each end tied in the middle. A Penrose drain was placed the base of the wound and the skin stapled.  The ostomy was then opened and matured with 3 and 4-0 Vicryl. A colostomy bag was applied as well as sterile dressing.  A right internal jugular central line was inserted using the ultrasound guidance for postoperative IV access pain control and nutrition. Chest x-ray is pending. The  patient was awakened and returned recovery room having tolerated procedure well. Sponge instrument needle count correct 2 in the operating room.

## 2016-06-09 NOTE — Progress Notes (Signed)
Pharmacy Antibiotic Note  [name] is a 70 y.o. female admitted on 06/08/2016 with [facility]AI.  Pharmacy has been consulted for Primaxin dosing.  Plan: Current orders for Primaxin 300 mg IV Q6H (CrCl 30 to 60 mL/min adjusted dose for 500 mg IV q6h). Pt's renal function has worsened. Will adjust dose to 500 mg IV q12h (CrCl 15-30 ml/min adjusted dose for 1000 mg IV q8h/q6h - will increase dose due to pt's fevers).   Height: 5\' 5"  (165.1 cm) Weight: 218 lb 4.8 oz (99 kg) IBW/kg (Calculated) : 57  Temp (24hrs), Avg:99.3 F (37.4 C), Min:97.4 F (36.3 C), Max:101.4 F (38.6 C)   Recent Labs Lab 06/08/16 0158 06/09/16 0510  WBC 18.6* 20.1*  CREATININE 1.82* 2.39*    Estimated Creatinine Clearance: 25.5 mL/min (by C-G formula based on SCr of 2.39 mg/dL).    Allergies  Allergen Reactions  . Doxycycline Other (See Comments)    Reaction: unknown  . Levaquin [Levofloxacin] Other (See Comments)    Causes joint pain  . Clindamycin/Lincomycin Itching    Petechia  . Penicillins Rash and Other (See Comments)    Has patient had a PCN reaction causing immediate rash, facial/tongue/throat swelling, SOB or lightheadedness with hypotension: Yes Has patient had a PCN reaction causing severe rash involving mucus membranes or skin necrosis: No Has patient had a PCN reaction that required hospitalization No Has patient had a PCN reaction occurring within the last 10 years: No If all of the above answers are "NO", then may proceed with Cephalosporin use.  . Sulfa Antibiotics Swelling and Rash    Thank you for allowing pharmacy to be a part of this patient's care.  Rocky Morel, Pharm.D., BCPS Clinical Pharmacist 06/09/2016 7:42 AM

## 2016-06-09 NOTE — Transfer of Care (Signed)
Immediate Anesthesia Transfer of Care Note  Patient: DRUANN STOLZMAN  Procedure(s) Performed: Procedure(s): COLON RESECTION SIGMOID (N/A) COLOSTOMY (N/A) INSERTION CENTRAL LINE ADULT (Right)  Patient Location: PACU  Anesthesia Type:General  Level of Consciousness: sedated  Airway & Oxygen Therapy: Patient Spontanous Breathing and Patient connected to face mask oxygen  Post-op Assessment: Report given to RN and Post -op Vital signs reviewed and stable  Post vital signs: Reviewed and stable  Last Vitals:  Vitals:   06/09/16 0855 06/09/16 1430  BP: (!) 143/61   Pulse: 100   Resp: 20   Temp:  (P) 36.4 C    Last Pain:  Vitals:   06/09/16 0855  TempSrc: Tympanic  PainSc: 3       Patients Stated Pain Goal: 0 ([id number])  Complications: No apparent anesthesia complications

## 2016-06-09 NOTE — Op Note (Signed)
06/08/2016 - 06/09/2016  2:17 PM  PATIENT:  Raiford Simmonds  70 y.o. female  PRE-OPERATIVE DIAGNOSIS:  Perforated Sidmoid Diverticulitis   POST-OPERATIVE DIAGNOSIS:  Perforated Sidmoid Diverticulitis   PROCEDURE:  Procedure(s): COLON RESECTION SIGMOID (N/A) COLOSTOMY (N/A) INSERTION CENTRAL LINE ADULT (Right)  SURGEON:  Surgeon(s) and Role:    * Dia Crawford III, MD - Primary   ASSISTANTS: none   ANESTHESIA:   general  EBL:  Total I/O In: 2533 [I.V.:2533] Out: 500 [Urine:150; Blood:350]   DRAINS: Penrose drain in the wound   LOCAL MEDICATIONS USED:  NONE   DISPOSITION OF SPECIMEN:  PATHOLOGY   DICTATION: .Dragon Dictation  PLAN OF CARE: Admit to inpatient   PATIENT DISPOSITION:  PACU - hemodynamically stable.   Dia Crawford III, MD

## 2016-06-09 NOTE — OR Nursing (Signed)
The original specimen sheet was deleted by [facility] RN because Dr. Pat Patrick wanted to add an additional specimen to the container. [name] had originally printed the specimen sheet with the contents as being "decending colon". Dr. Pat Patrick wanted to add an additional specimen to the container which was sigmoid colon. I printed a new specimen sheet that has the specimen as being descending colon and sigmoid colon.

## 2016-06-09 NOTE — Anesthesia Postprocedure Evaluation (Signed)
Anesthesia Post Note  Patient: [name]  Procedure(s) Performed: Procedure(s) (LRB): COLON RESECTION SIGMOID (N/A) COLOSTOMY (N/A) INSERTION CENTRAL LINE ADULT (Right)  Patient location during evaluation: PACU Anesthesia Type: General Level of consciousness: awake and alert and oriented Pain management: pain level controlled Vital Signs Assessment: post-procedure vital signs reviewed and stable Respiratory status: spontaneous breathing, nonlabored ventilation and respiratory function stable Cardiovascular status: blood pressure returned to baseline and stable Postop Assessment: no signs of nausea or vomiting Anesthetic complications: no Comments: Patient seemed to have shallow breathing in PACU, had neostigmine for reversal intraop, but given sugammadex due to concern for weakness and patient had significant improvement.    Last Vitals:  Vitals:   06/09/16 1550 06/09/16 1607  BP: (!) 170/65 (!) 160/58  Pulse: 99 98  Resp: 16 20  Temp:  36.6 C    Last Pain:  Vitals:   06/09/16 1607  TempSrc: Oral  PainSc:                  [name]

## 2016-06-10 ENCOUNTER — Encounter: Payer: Self-pay | Admitting: Surgery

## 2016-06-10 LAB — CBC
HCT: 26.8 % — ABNORMAL LOW (ref 35.0–47.0)
HEMOGLOBIN: 8.8 g/dL — AB (ref 12.0–16.0)
MCH: 29.1 pg (ref 26.0–34.0)
MCHC: 33 g/dL (ref 32.0–36.0)
MCV: 88.1 fL (ref 80.0–100.0)
PLATELETS: 186 10*3/uL (ref 150–440)
RBC: 3.04 MIL/uL — AB (ref 3.80–5.20)
RDW: 15.6 % — AB (ref 11.5–14.5)
WBC: 18.8 10*3/uL — AB (ref 3.6–11.0)

## 2016-06-10 LAB — BASIC METABOLIC PANEL
ANION GAP: 7 (ref 5–15)
BUN: 37 mg/dL — AB (ref 6–20)
CALCIUM: 7.8 mg/dL — AB (ref 8.9–10.3)
CO2: 20 mmol/L — ABNORMAL LOW (ref 22–32)
CREATININE: 1.97 mg/dL — AB (ref 0.44–1.00)
Chloride: 110 mmol/L (ref 101–111)
GFR calc Af Amer: 28 mL/min — ABNORMAL LOW (ref >60)
GFR, EST NON AFRICAN AMERICAN: 25 mL/min — AB (ref >60)
GLUCOSE: 152 mg/dL — AB (ref 65–99)
Potassium: 4.6 mmol/L (ref 3.5–5.1)
Sodium: 137 mmol/L (ref 135–145)

## 2016-06-10 LAB — GLUCOSE, CAPILLARY
GLUCOSE-CAPILLARY: 129 mg/dL — AB (ref 65–99)
GLUCOSE-CAPILLARY: 136 mg/dL — AB (ref 65–99)
GLUCOSE-CAPILLARY: 138 mg/dL — AB (ref 65–99)
GLUCOSE-CAPILLARY: 172 mg/dL — AB (ref 65–99)
GLUCOSE-CAPILLARY: 236 mg/dL — AB (ref 65–99)
Glucose-Capillary: 152 mg/dL — ABNORMAL HIGH (ref 65–99)

## 2016-06-10 LAB — MAGNESIUM: MAGNESIUM: 1.4 mg/dL — AB (ref 1.7–2.4)

## 2016-06-10 LAB — PHOSPHORUS: Phosphorus: 3.4 mg/dL (ref 2.5–4.6)

## 2016-06-10 MED ORDER — M.V.I. ADULT IV INJ
INJECTION | INTRAVENOUS | Status: AC
  Administered 2016-06-10: 17:00:00 via INTRAVENOUS
  Filled 2016-06-10: qty 960

## 2016-06-10 MED ORDER — FAMOTIDINE IN NACL 20-0.9 MG/50ML-% IV SOLN
20.0000 mg | INTRAVENOUS | Status: DC
  Administered 2016-06-11 – 2016-06-22 (×12): 20 mg via INTRAVENOUS
  Filled 2016-06-10 (×12): qty 50

## 2016-06-10 MED ORDER — SODIUM CHLORIDE 0.45 % IV SOLN
INTRAVENOUS | Status: DC
  Administered 2016-06-10 – 2016-06-11 (×3): via INTRAVENOUS

## 2016-06-10 MED ORDER — IPRATROPIUM-ALBUTEROL 0.5-2.5 (3) MG/3ML IN SOLN
3.0000 mL | Freq: Four times a day (QID) | RESPIRATORY_TRACT | Status: DC | PRN
  Administered 2016-06-10 – 2016-06-11 (×2): 3 mL via RESPIRATORY_TRACT
  Filled 2016-06-10 (×2): qty 3

## 2016-06-10 MED ORDER — ENOXAPARIN SODIUM 40 MG/0.4ML ~~LOC~~ SOLN
40.0000 mg | SUBCUTANEOUS | Status: DC
  Administered 2016-06-11 – 2016-06-15 (×5): 40 mg via SUBCUTANEOUS
  Filled 2016-06-10 (×5): qty 0.4

## 2016-06-10 MED ORDER — MAGNESIUM SULFATE 2 GM/50ML IV SOLN
2.0000 g | Freq: Once | INTRAVENOUS | Status: AC
  Administered 2016-06-10: 2 g via INTRAVENOUS
  Filled 2016-06-10: qty 50

## 2016-06-10 MED ORDER — SODIUM CHLORIDE 0.9 % IV SOLN
500.0000 mg | Freq: Three times a day (TID) | INTRAVENOUS | Status: DC
  Administered 2016-06-10 – 2016-06-13 (×9): 500 mg via INTRAVENOUS
  Filled 2016-06-10 (×12): qty 500

## 2016-06-10 NOTE — Care Management Important Message (Signed)
Important Message  Patient Details  Name: [name] MRN: [id number] Date of Birth: 22-Mar-1946   Medicare Important Message Given:  Yes    Beverly Sessions, RN 06/10/2016, 4:14 PM

## 2016-06-10 NOTE — Consult Note (Signed)
[place] [facility]                                                                                                                                                                                            Patient Demographics   [name], is a 70 y.o. female, DOB - September 28, 1946, [id number]  Admit date - 06/08/2016   Admitting Physician Clayburn Pert, MD  Outpatient Primary MD for the patient is Singh,[name], MD   LOS - 2  Subjective:Patient seen in the PACU after surgery She is complaining of pain in her abdomen but no shortness of breath      Review of Systems:   CONSTITUTIONAL: No documented fever. No fatigue, weakness. No weight gain, no weight loss.  EYES: No blurry or double vision.  ENT: No tinnitus. No postnasal drip. No redness of the oropharynx.  RESPIRATORY: No cough, no wheeze, no hemoptysis. No dyspnea.  CARDIOVASCULAR: No chest pain. No orthopnea. No palpitations. No syncope.  GASTROINTESTINAL: No nausea, no vomiting or diarrhea. Positive abdominal pain. No melena or hematochezia.  GENITOURINARY: No dysuria or hematuria.  ENDOCRINE: No polyuria or nocturia. No heat or cold intolerance.  HEMATOLOGY: No anemia. No bruising. No bleeding.  INTEGUMENTARY: No rashes. No lesions.  MUSCULOSKELETAL: No arthritis. No swelling. No gout.  NEUROLOGIC: No numbness, tingling, or ataxia. No seizure-type activity.  PSYCHIATRIC: No anxiety. No insomnia. No ADD.    Vitals:   Vitals:   06/10/16 0013 06/10/16 0400 06/10/16 0500 06/10/16 0800  BP: (!) 139/55  (!) 139/52   Pulse: (!) 101  (!) 108   Resp: 16 18 20 14   Temp: 98.5 F (36.9 C)  98.6 F (37 C)   TempSrc: Oral  Oral   SpO2: 99% 98% 100% 100%  Weight:      Height:        Wt Readings from Last 3 Encounters:  06/08/16 99 kg (218 lb 4.8 oz)  02/17/16 99.3 kg (219 lb)  11/05/15 97.7 kg (215 lb 5 oz)     Intake/Output Summary (Last 24 hours) at 06/10/16 1154 Last  data filed at 06/10/16 0834  Gross per 24 hour  Intake          3383.89 ml  Output             1400 ml  Net          1983.89 ml    Physical Exam:   GENERAL: Pleasant-appearing in no apparent distress.  HEAD, EYES, EARS, NOSE AND THROAT: Atraumatic, normocephalic. Extraocular muscles are intact. Pupils equal and reactive to light. Sclerae anicteric.  No conjunctival injection. No oro-pharyngeal erythema.  NECK: Supple. There is no jugular venous distention. No bruits, no lymphadenopathy, no thyromegaly.  HEART: Regular rate and rhythm,. No murmurs, no rubs, no clicks.  LUNGS: Clear to auscultation bilaterally. No rales or rhonchi. No wheezes.  ABDOMEN: Postop nondistended  EXTREMITIES: No evidence of any cyanosis, clubbing, or peripheral edema.  +2 pedal and radial pulses bilaterally.  NEUROLOGIC: The patient is alert, awake, and oriented x3 with no focal motor or sensory deficits appreciated bilaterally.  SKIN: Moist and warm with no rashes appreciated.  Psych: Not anxious, depressed LN: No inguinal LN enlargement    Antibiotics   Anti-infectives    Start     Dose/Rate Route Frequency Ordered Stop   06/10/16 1015  imipenem-cilastatin (PRIMAXIN) 500 mg in sodium chloride 0.9 % 100 mL IVPB     500 mg 200 mL/hr over 30 Minutes Intravenous Every 8 hours 06/10/16 1011     06/09/16 0930  imipenem-cilastatin (PRIMAXIN) 500 mg in sodium chloride 0.9 % 100 mL IVPB  Status:  Discontinued     500 mg 200 mL/hr over 30 Minutes Intravenous Every 12 hours 06/09/16 0741 06/10/16 1011   06/08/16 0600  imipenem-cilastatin (PRIMAXIN) 300 mg in sodium chloride 0.9 % 100 mL IVPB  Status:  Discontinued     300 mg 200 mL/hr over 30 Minutes Intravenous Every 6 hours 06/08/16 0519 06/09/16 0741      Medications   Scheduled Meds: . amLODipine  10 mg Oral Daily  . enoxaparin (LOVENOX) injection  30 mg Subcutaneous Q24H  . famotidine (PEPCID) IV  20 mg Intravenous Q12H  . hydrALAZINE  25 mg Oral Q8H   . HYDROmorphone   Intravenous Q4H  . imipenem-cilastatin  500 mg Intravenous Q8H  . insulin aspart  0-20 Units Subcutaneous Q4H  . lisinopril  20 mg Oral Daily  . metoprolol tartrate  50 mg Oral BID   Continuous Infusions: . 0.45 % NaCl with KCl 20 mEq / L 150 mL/hr at 06/09/16 1712   PRN Meds:.acetaminophen, diphenhydrAMINE **OR** diphenhydrAMINE, hydrALAZINE, ipratropium-albuterol, naloxone **AND** sodium chloride flush, ondansetron **OR** ondansetron (ZOFRAN) IV   Data Review:   Micro Results Recent Results (from the past 240 hour(s))  CULTURE, BLOOD (ROUTINE X 2) w Reflex to ID Panel     Status: None (Preliminary result)   Collection Time: 06/08/16  6:46 PM  Result Value Ref Range Status   Specimen Description BLOOD LEFT HAND  Final   Special Requests BOTTLES DRAWN AEROBIC AND ANAEROBIC 4ML  Final   Culture NO GROWTH 2 DAYS  Final   Report Status PENDING  Incomplete  CULTURE, BLOOD (ROUTINE X 2) w Reflex to ID Panel     Status: None (Preliminary result)   Collection Time: 06/08/16  7:03 PM  Result Value Ref Range Status   Specimen Description BLOOD LEFT HAND  Final   Special Requests BOTTLES DRAWN AEROBIC AND ANAEROBIC 5ML  Final   Culture NO GROWTH 2 DAYS  Final   Report Status PENDING  Incomplete  Aerobic/Anaerobic Culture (surgical/deep wound)     Status: None (Preliminary result)   Collection Time: 06/09/16 10:02 AM  Result Value Ref Range Status   Specimen Description ABDOMEN  Final   Special Requests NONE  Final   Gram Stain   Final    MODERATE WBC PRESENT,BOTH PMN AND MONONUCLEAR FEW GRAM NEGATIVE RODS RARE GRAM POSITIVE COCCI IN PAIRS RARE SQUAMOUS EPITHELIAL CELLS PRESENT Performed at [facility]  Culture FEW GRAM NEGATIVE RODS  Final   Report Status PENDING  Incomplete    Radiology Reports Ct Abdomen Pelvis W Contrast  Result Date: 06/08/2016 CLINICAL DATA:  Urinary frequency for 2 weeks. Pelvic and suprapubic pain since yesterday. Low back  pain. Nausea. EXAM: CT ABDOMEN AND PELVIS WITH CONTRAST TECHNIQUE: Multidetector CT imaging of the abdomen and pelvis was performed using the standard protocol following bolus administration of intravenous contrast. CONTRAST:  73mL ISOVUE-300 IOPAMIDOL (ISOVUE-300) INJECTION 61% COMPARISON:  None. FINDINGS: Dependent atelectasis in the lung bases. There is pneumoperitoneum with free intra-abdominal air throughout the abdomen and pelvis. Mild infiltration in the mesentery. Small amount of free fluid in the pelvis. Diverticula in the sigmoid colon with inflammatory stranding around the proximal sigmoid colon. Small pericolonic abscess measuring 1.4 x 2.7 cm. Changes are consistent with perforated acute diverticulitis. No small or large bowel distention. Small esophageal hiatal hernia. The liver, spleen, gallbladder, pancreas, adrenal glands, kidneys, abdominal aorta, inferior vena cava, and retroperitoneal lymph nodes are unremarkable. Pelvis: Bladder wall is not thickened. No pelvic mass or lymphadenopathy. Appendix is normal. No destructive bone lesions. IMPRESSION: Perforated acute diverticulitis with diffuse free intraperitoneal air. Small pericolonic abscess adjacent to the junction of the descending and sigmoid colon. These results were called by telephone at the time of interpretation on 06/08/2016 at 3:07 am to Dr. Charlesetta Ivory , who verbally acknowledged these results. Electronically Signed   By: Lucienne Capers M.D.   On: 06/08/2016 03:28   Dg Chest Port 1 View  Result Date: 06/09/2016 CLINICAL DATA:  Central line placement EXAM: PORTABLE CHEST 1 VIEW COMPARISON:  11/05/2015 FINDINGS: Mild cardiomegaly. Mediastinal shadows are normal. Right internal jugular central line has its tip in the SVC just below the azygos level. No pneumothorax or hemothorax. Lungs clear. IMPRESSION: Central line tip in the SVC just below the azygos level, 3 cm above the right atrium. No complication. Electronically Signed    By: Nelson Chimes M.D.   On: 06/09/2016 15:44     CBC  Recent Labs Lab 06/08/16 0158 06/09/16 0510 06/10/16 0456  WBC 18.6* 20.1* 18.8*  HGB 11.7* 9.8* 8.8*  HCT 35.4 30.3* 26.8*  PLT 189 200 186  MCV 87.2 89.5 88.1  MCH 28.9 28.8 29.1  MCHC 33.1 32.2 33.0  RDW 15.2* 15.4* 15.6*    Chemistries   Recent Labs Lab 06/08/16 0158 06/09/16 0510 06/10/16 0456  NA 138 137 137  K 3.9 3.7 4.6  CL 104 103 110  CO2 24 23 20*  GLUCOSE 65 193* 152*  BUN 34* 37* 37*  CREATININE 1.82* 2.39* 1.97*  CALCIUM 9.8 9.0 7.8*  MG  --  1.5* 1.4*  AST 23  --   --   ALT 11*  --   --   ALKPHOS 59  --   --   BILITOT 0.8  --   --    ------------------------------------------------------------------------------------------------------------------ estimated creatinine clearance is 31 mL/min (by C-G formula based on SCr of 1.97 mg/dL). ------------------------------------------------------------------------------------------------------------------ No results for input(s): HGBA1C in the last 72 hours. ------------------------------------------------------------------------------------------------------------------ No results for input(s): CHOL, HDL, LDLCALC, TRIG, CHOLHDL, LDLDIRECT in the last 72 hours. ------------------------------------------------------------------------------------------------------------------ No results for input(s): TSH, T4TOTAL, T3FREE, THYROIDAB in the last 72 hours.  Invalid input(s): FREET3 ------------------------------------------------------------------------------------------------------------------ No results for input(s): VITAMINB12, FOLATE, FERRITIN, TIBC, IRON, RETICCTPCT in the last 72 hours.  Coagulation profile No results for input(s): INR, PROTIME in the last 168 hours.  No results for input(s): DDIMER in the last 72  hours.  Cardiac Enzymes No results for input(s): CKMB, TROPONINI, MYOGLOBIN in the last 168 hours.  Invalid input(s):  CK ------------------------------------------------------------------------------------------------------------------ Invalid input(s): [place]   [name]  is a 70 y.o. female with a known history of Hypertension, insulin-dependent diabetes mellitus, CK D, rheumatoid arthritis, hypothyroidism presents to the hospital secondary to worsening abdominal pain and noted to have perforated sigmoid diverticulitis.  #1 hypertension-Blood pressures improved -Continue Norvasc, metoprolol, oral hydralazine and lisinopril. -Continue to hold hydrochlorothiazide and Lasix -IV hydralazine as needed.  #2 diabetes mellitus-currently nothing by mouth. -Continue sliding scale for now. We'll monitor her blood sugars started on a regimen as needed  #3 CHF-patient had echocardiogram done as an outpatient for chronic exertional dyspnea and her last EF is 60-65%. Chronic diastolic CHF is currently compensated  #4 rheumatoid arthritis-follows with rheumatology. On very low-dose prednisone for maintenance and also recent vasculitis episode. Currently being held  #5 perforated sigmoid diverticulitis-now spiking fevers, ordered blood cultures. -Continue antibiotics with Primaxin -Status post surgery      Code Status Orders        Start     Ordered   06/08/16 0631  Full code  Continuous     06/08/16 0630    Code Status History    Date Active Date Inactive Code Status Order ID Comments User Context   11/05/2015 10:26 AM 11/10/2015  5:22 PM Full Code [id number]  Saundra Shelling, MD ED   11/05/2015  7:14 AM 11/05/2015 10:26 AM Full Code [id number]  Saundra Shelling, MD ED              DVT Prophylaxis SCDs  Lab Results  Component Value Date   PLT 186 06/10/2016     Time Spent in minutes 35 minute Greater than 50% of time spent in care coordination and counseling patient regarding the condition and plan of care.   Dustin Flock M.D on 06/10/2016 at 11:54 AM  Between  7am to 6pm - Pager - [phone]  After 6pm go to [email or web address] - password [email or web address] [facility]   Office  [phone]

## 2016-06-10 NOTE — Consult Note (Signed)
[place] at [facility]                                                                                                                                                                                            Patient Demographics   [name], is a 70 y.o. female, DOB - [birth year], [id number]  Admit date - 06/08/2016   Admitting Physician Clayburn Pert, MD  Outpatient Primary MD for the patient is Singh,[name], MD   LOS - 2  SubjectivePatient reports her abdominal pain is better denies any chest pain is using incentive spirometry frequently     Review of Systems:   CONSTITUTIONAL: No documented fever. No fatigue, weakness. No weight gain, no weight loss.  EYES: No blurry or double vision.  ENT: No tinnitus. No postnasal drip. No redness of the oropharynx.  RESPIRATORY: No cough, no wheeze, no hemoptysis. No dyspnea.  CARDIOVASCULAR: No chest pain. No orthopnea. No palpitations. No syncope.  GASTROINTESTINAL: No nausea, no vomiting or diarrhea. Improved abdominal pain. No melena or hematochezia.  GENITOURINARY: No dysuria or hematuria.  ENDOCRINE: No polyuria or nocturia. No heat or cold intolerance.  HEMATOLOGY: No anemia. No bruising. No bleeding.  INTEGUMENTARY: No rashes. No lesions.  MUSCULOSKELETAL: No arthritis. No swelling. No gout.  NEUROLOGIC: No numbness, tingling, or ataxia. No seizure-type activity.  PSYCHIATRIC: No anxiety. No insomnia. No ADD.    Vitals:   Vitals:   06/10/16 0013 06/10/16 0400 06/10/16 0500 06/10/16 0800  BP: (!) 139/55  (!) 139/52   Pulse: (!) 101  (!) 108   Resp: 16 18 20 14   Temp: 98.5 F (36.9 C)  98.6 F (37 C)   TempSrc: Oral  Oral   SpO2: 99% 98% 100% 100%  Weight:      Height:        Wt Readings from Last 3 Encounters:  06/08/16 99 kg (218 lb 4.8 oz)  02/17/16 99.3 kg (219 lb)  11/05/15 97.7 kg (215 lb 5 oz)     Intake/Output Summary (Last 24 hours) at 06/10/16 1201 Last data  filed at 06/10/16 0834  Gross per 24 hour  Intake          3383.89 ml  Output             1400 ml  Net          1983.89 ml    Physical Exam:   GENERAL: Pleasant-appearing in no apparent distress.  HEAD, EYES, EARS, NOSE AND THROAT: Atraumatic, normocephalic. Extraocular muscles are intact. Pupils equal and reactive to light. Sclerae anicteric. No conjunctival injection. No oro-pharyngeal  erythema.  NECK: Supple. There is no jugular venous distention. No bruits, no lymphadenopathy, no thyromegaly.  HEART: Regular rate and rhythm,. No murmurs, no rubs, no clicks.  LUNGS: Clear to auscultation bilaterally. No rales or rhonchi. No wheezes.  ABDOMEN: Postop nondistended  EXTREMITIES: No evidence of any cyanosis, clubbing, or peripheral edema.  +2 pedal and radial pulses bilaterally.  NEUROLOGIC: The patient is alert, awake, and oriented x3 with no focal motor or sensory deficits appreciated bilaterally.  SKIN: Moist and warm with no rashes appreciated.  Psych: Not anxious, depressed LN: No inguinal LN enlargement    Antibiotics   Anti-infectives    Start     Dose/Rate Route Frequency Ordered Stop   06/10/16 1015  imipenem-cilastatin (PRIMAXIN) 500 mg in sodium chloride 0.9 % 100 mL IVPB     500 mg 200 mL/hr over 30 Minutes Intravenous Every 8 hours 06/10/16 1011     06/09/16 0930  imipenem-cilastatin (PRIMAXIN) 500 mg in sodium chloride 0.9 % 100 mL IVPB  Status:  Discontinued     500 mg 200 mL/hr over 30 Minutes Intravenous Every 12 hours 06/09/16 0741 06/10/16 1011   06/08/16 0600  imipenem-cilastatin (PRIMAXIN) 300 mg in sodium chloride 0.9 % 100 mL IVPB  Status:  Discontinued     300 mg 200 mL/hr over 30 Minutes Intravenous Every 6 hours 06/08/16 0519 06/09/16 0741      Medications   Scheduled Meds: . amLODipine  10 mg Oral Daily  . enoxaparin (LOVENOX) injection  30 mg Subcutaneous Q24H  . famotidine (PEPCID) IV  20 mg Intravenous Q12H  . hydrALAZINE  25 mg Oral Q8H  .  HYDROmorphone   Intravenous Q4H  . imipenem-cilastatin  500 mg Intravenous Q8H  . insulin aspart  0-20 Units Subcutaneous Q4H  . lisinopril  20 mg Oral Daily  . magnesium sulfate 1 - 4 g bolus IVPB  2 g Intravenous Once  . metoprolol tartrate  50 mg Oral BID   Continuous Infusions: . 0.45 % NaCl with KCl 20 mEq / L 150 mL/hr at 06/09/16 1712   PRN Meds:.acetaminophen, diphenhydrAMINE **OR** diphenhydrAMINE, hydrALAZINE, ipratropium-albuterol, naloxone **AND** sodium chloride flush, ondansetron **OR** ondansetron (ZOFRAN) IV   Data Review:   Micro Results Recent Results (from the past 240 hour(s))  CULTURE, BLOOD (ROUTINE X 2) w Reflex to ID Panel     Status: None (Preliminary result)   Collection Time: 06/08/16  6:46 PM  Result Value Ref Range Status   Specimen Description BLOOD LEFT HAND  Final   Special Requests BOTTLES DRAWN AEROBIC AND ANAEROBIC 4ML  Final   Culture NO GROWTH 2 DAYS  Final   Report Status PENDING  Incomplete  CULTURE, BLOOD (ROUTINE X 2) w Reflex to ID Panel     Status: None (Preliminary result)   Collection Time: 06/08/16  7:03 PM  Result Value Ref Range Status   Specimen Description BLOOD LEFT HAND  Final   Special Requests BOTTLES DRAWN AEROBIC AND ANAEROBIC 5ML  Final   Culture NO GROWTH 2 DAYS  Final   Report Status PENDING  Incomplete  Aerobic/Anaerobic Culture (surgical/deep wound)     Status: None (Preliminary result)   Collection Time: 06/09/16 10:02 AM  Result Value Ref Range Status   Specimen Description ABDOMEN  Final   Special Requests NONE  Final   Gram Stain   Final    MODERATE WBC PRESENT,BOTH PMN AND MONONUCLEAR FEW GRAM NEGATIVE RODS RARE GRAM POSITIVE COCCI IN PAIRS RARE SQUAMOUS EPITHELIAL  CELLS PRESENT Performed at [facility]  Final   Report Status PENDING  Incomplete    Radiology Reports Ct Abdomen Pelvis W Contrast  Result Date: 06/08/2016 CLINICAL DATA:  Urinary frequency for 2  weeks. Pelvic and suprapubic pain since yesterday. Low back pain. Nausea. EXAM: CT ABDOMEN AND PELVIS WITH CONTRAST TECHNIQUE: Multidetector CT imaging of the abdomen and pelvis was performed using the standard protocol following bolus administration of intravenous contrast. CONTRAST:  20mL ISOVUE-300 IOPAMIDOL (ISOVUE-300) INJECTION 61% COMPARISON:  None. FINDINGS: Dependent atelectasis in the lung bases. There is pneumoperitoneum with free intra-abdominal air throughout the abdomen and pelvis. Mild infiltration in the mesentery. Small amount of free fluid in the pelvis. Diverticula in the sigmoid colon with inflammatory stranding around the proximal sigmoid colon. Small pericolonic abscess measuring 1.4 x 2.7 cm. Changes are consistent with perforated acute diverticulitis. No small or large bowel distention. Small esophageal hiatal hernia. The liver, spleen, gallbladder, pancreas, adrenal glands, kidneys, abdominal aorta, inferior vena cava, and retroperitoneal lymph nodes are unremarkable. Pelvis: Bladder wall is not thickened. No pelvic mass or lymphadenopathy. Appendix is normal. No destructive bone lesions. IMPRESSION: Perforated acute diverticulitis with diffuse free intraperitoneal air. Small pericolonic abscess adjacent to the junction of the descending and sigmoid colon. These results were called by telephone at the time of interpretation on 06/08/2016 at 3:07 am to Dr. Charlesetta Ivory , who verbally acknowledged these results. Electronically Signed   By: Lucienne Capers M.D.   On: 06/08/2016 03:28   Dg Chest Port 1 View  Result Date: 06/09/2016 CLINICAL DATA:  Central line placement EXAM: PORTABLE CHEST 1 VIEW COMPARISON:  11/05/2015 FINDINGS: Mild cardiomegaly. Mediastinal shadows are normal. Right internal jugular central line has its tip in the SVC just below the azygos level. No pneumothorax or hemothorax. Lungs clear. IMPRESSION: Central line tip in the SVC just below the azygos level, 3 cm above  the right atrium. No complication. Electronically Signed   By: Nelson Chimes M.D.   On: 06/09/2016 15:44     CBC  Recent Labs Lab 06/08/16 0158 06/09/16 0510 06/10/16 0456  WBC 18.6* 20.1* 18.8*  HGB 11.7* 9.8* 8.8*  HCT 35.4 30.3* 26.8*  PLT 189 200 186  MCV 87.2 89.5 88.1  MCH 28.9 28.8 29.1  MCHC 33.1 32.2 33.0  RDW 15.2* 15.4* 15.6*    Chemistries   Recent Labs Lab 06/08/16 0158 06/09/16 0510 06/10/16 0456  NA 138 137 137  K 3.9 3.7 4.6  CL 104 103 110  CO2 24 23 20*  GLUCOSE 65 193* 152*  BUN 34* 37* 37*  CREATININE 1.82* 2.39* 1.97*  CALCIUM 9.8 9.0 7.8*  MG  --  1.5* 1.4*  AST 23  --   --   ALT 11*  --   --   ALKPHOS 59  --   --   BILITOT 0.8  --   --    ------------------------------------------------------------------------------------------------------------------ estimated creatinine clearance is 31 mL/min (by C-G formula based on SCr of 1.97 mg/dL). ------------------------------------------------------------------------------------------------------------------ No results for input(s): HGBA1C in the last 72 hours. ------------------------------------------------------------------------------------------------------------------ No results for input(s): CHOL, HDL, LDLCALC, TRIG, CHOLHDL, LDLDIRECT in the last 72 hours. ------------------------------------------------------------------------------------------------------------------ No results for input(s): TSH, T4TOTAL, T3FREE, THYROIDAB in the last 72 hours.  Invalid input(s): FREET3 ------------------------------------------------------------------------------------------------------------------ No results for input(s): VITAMINB12, FOLATE, FERRITIN, TIBC, IRON, RETICCTPCT in the last 72 hours.  Coagulation profile No results for input(s): INR, PROTIME in the last 168 hours.  No results for input(s): DDIMER in the last 72 hours.  Cardiac Enzymes No results for input(s): CKMB, TROPONINI, MYOGLOBIN  in the last 168 hours.  Invalid input(s): CK ------------------------------------------------------------------------------------------------------------------ Invalid input(s): Gage   [name]  is a 70 y.o. female with a known history of Hypertension, insulin-dependent diabetes mellitus, CK D, rheumatoid arthritis, hypothyroidism presents to the hospital secondary to worsening abdominal pain and noted to have perforated sigmoid diverticulitis.  #1Central hypertension Currently stable -Continue Norvasc, metoprolol, oral hydralazine and lisinopril. -Continue to hold hydrochlorothiazide and Lasix -IV hydralazine as needed.  #2 diabetes mellitus-blood sugar fluctuating but less than 200. -Continue sliding scale for now. If they start going up we will have to start her on a regimen  #3 chronic diastolic CHF currently compensated due to acute renal failure Lasix on hold  #4 rheumatoid arthritis-follows with rheumatology. On very low-dose prednisone for maintenance and also recent vasculitis episode. Currently being held  #5 perforated sigmoid status post surgery Continue IV antibiotics  #6 acute on chronic renal failure renal function improved close to her baseline continue to monitor what nephrotoxins  #7 hypomagnesemia we'll replace her magnesium     Code Status Orders        Start     Ordered   06/08/16 0631  Full code  Continuous     06/08/16 0630    Code Status History    Date Active Date Inactive Code Status Order ID Comments User Context   11/05/2015 10:26 AM 11/10/2015  5:22 PM Full Code [id number]  Saundra Shelling, MD ED   11/05/2015  7:14 AM 11/05/2015 10:26 AM Full Code [id number]  Saundra Shelling, MD ED              DVT Prophylaxis SCDs  Lab Results  Component Value Date   PLT 186 06/10/2016     Time Spent in minutes 32 minute Greater than 50% of time spent in care coordination and counseling patient regarding the condition  and plan of care.   Dustin Flock M.D on 06/10/2016 at 12:01 PM  Between 7am to 6pm - Pager - [phone]  After 6pm go to [email or web address] - password [email or web address] [facility]   Office  [phone]

## 2016-06-10 NOTE — Progress Notes (Signed)
PARENTERAL NUTRITION CONSULT NOTE - INITIAL  Pharmacy Consult for TPN Indication: colon resection, NPO  Allergies  Allergen Reactions  . Doxycycline Other (See Comments)    Reaction: unknown  . Levaquin [Levofloxacin] Other (See Comments)    Causes joint pain  . Clindamycin/Lincomycin Itching    Petechia  . Penicillins Rash and Other (See Comments)    Has patient had a PCN reaction causing immediate rash, facial/tongue/throat swelling, SOB or lightheadedness with hypotension: Yes Has patient had a PCN reaction causing severe rash involving mucus membranes or skin necrosis: No Has patient had a PCN reaction that required hospitalization No Has patient had a PCN reaction occurring within the last 10 years: No If all of the above answers are "NO", then may proceed with Cephalosporin use.  . Sulfa Antibiotics Swelling and Rash    Patient Measurements: Height: 5\' 5"  (165.1 cm) Weight: 218 lb 4.8 oz (99 kg) IBW/kg (Calculated) : 57   Vital Signs: Temp: 98.6 F (37 C) (08/17 0500) Temp Source: Oral (08/17 0500) BP: 144/56 (08/17 1251) Pulse Rate: 107 (08/17 1251) Intake/Output from previous day: 08/16 0701 - 08/17 0700 In: 4447.9 [I.V.:4297.9; IV Piggyback:150] Out: 1150 [Urine:900; Stool:50; Blood:200] Intake/Output from this shift: Total I/O In: 519 [I.V.:519] Out: 350 [Urine:250; Stool:100]  Labs:  Recent Labs  06/08/16 0158 06/09/16 0510 06/10/16 0456  WBC 18.6* 20.1* 18.8*  HGB 11.7* 9.8* 8.8*  HCT 35.4 30.3* 26.8*  PLT 189 200 186     Recent Labs  06/08/16 0158 06/09/16 0510 06/10/16 0456  NA 138 137 137  K 3.9 3.7 4.6  CL 104 103 110  CO2 24 23 20*  GLUCOSE 65 193* 152*  BUN 34* 37* 37*  CREATININE 1.82* 2.39* 1.97*  CALCIUM 9.8 9.0 7.8*  MG  --  1.5* 1.4*  PHOS  --  3.3 3.4  PROT 8.3*  --   --   ALBUMIN 4.2  --   --   AST 23  --   --   ALT 11*  --   --   ALKPHOS 59  --   --   BILITOT 0.8  --   --    Estimated Creatinine Clearance: 31  mL/min (by C-G formula based on SCr of 1.97 mg/dL).    Recent Labs  06/10/16 0457 06/10/16 0804 06/10/16 1255  GLUCAP 152* 136* 129*    Insulin Requirements in the past 24 hours:  28 units SSI (on Novolog 0-20 SQ q4h) Pt on Lantus at home  Current Nutrition:  NPO starting TPN tonight  Assessment: 70 yo female s/p colon resection surgery for diverticulitis on 8/16 starting on TPN tonight.   K 4.6, Phos 3.4, Mag 1.4 Mag 2 g IV ordered by MD  Plan:  Per dietician recommendation will order Clinimix E 5/20 at 40 ml/hr with MVI and trace elements. Spoke with Dietitian Joli who mentioned that she spoke with Dr. Pat Patrick and there are no plans to decrease IV fluid rate at this time with start of TPN as pt appears dehydrated. Called Dr. Pat Patrick and discussed high amount of KCl from IV fluids with start of TPN which would provide additional ~29 mEq of KCl. Per MD will take KCl out of fluids but keep fluid at same rate.   Will order another mag 2 g IV for total of 4 g per policy.   Will continue current SSI orders at this time. Anticipate that we will need to start Lantus with the start/increase in TPN.  Pharmacy will continue to follow and follow up on electrolytes and glucose.    Rayna Sexton L 06/10/2016,1:05 PM

## 2016-06-10 NOTE — Progress Notes (Signed)
Pharmacy Antibiotic Note  [name] is a 70 y.o. female admitted on 06/08/2016 with IAI.  Pharmacy has been consulted for Primaxin dosing.  Plan: Current orders for Primaxin 500 mg IV q12h. Renal function improving, will adjust to 500 mg IV q8h (CrCl 30-59 ml/min adjusted dose for 1000 mg IV q8h).   Height: 5\' 5"  (165.1 cm) Weight: 218 lb 4.8 oz (99 kg) IBW/kg (Calculated) : 57  Temp (24hrs), Avg:98.2 F (36.8 C), Min:97.5 F (36.4 C), Max:98.6 F (37 C)   Recent Labs Lab 06/08/16 0158 06/09/16 0510 06/10/16 0456  WBC 18.6* 20.1* 18.8*  CREATININE 1.82* 2.39* 1.97*    Estimated Creatinine Clearance: 31 mL/min (by C-G formula based on SCr of 1.97 mg/dL).    Allergies  Allergen Reactions  . Doxycycline Other (See Comments)    Reaction: unknown  . Levaquin [Levofloxacin] Other (See Comments)    Causes joint pain  . Clindamycin/Lincomycin Itching    Petechia  . Penicillins Rash and Other (See Comments)    Has patient had a PCN reaction causing immediate rash, facial/tongue/throat swelling, SOB or lightheadedness with hypotension: Yes Has patient had a PCN reaction causing severe rash involving mucus membranes or skin necrosis: No Has patient had a PCN reaction that required hospitalization No Has patient had a PCN reaction occurring within the last 10 years: No If all of the above answers are "NO", then may proceed with Cephalosporin use.  . Sulfa Antibiotics Swelling and Rash    Thank you for allowing pharmacy to be a part of this patient's care.  Rocky Morel, Pharm.D., BCPS Clinical Pharmacist 06/10/2016 9:37 AM

## 2016-06-10 NOTE — Progress Notes (Signed)
Subjective:   She is lethargic this morning but arousable. She is appropriate but appears to be heavily medicated. She denies any significant complaints other than abdominal pain. She's not had any nausea or vomiting. CBC today demonstrates white blood cell count of 18,000 with a hemoglobin of 8.8 down from 9.8 preoperatively. Her urine output has been approximately 300 cc overnight. Her creatinine is down to just below 2.  Vital signs in last 24 hours: Temp:  [97.5 F (36.4 C)-98.6 F (37 C)] 98.6 F (37 C) (08/17 0500) Pulse Rate:  [98-115] 108 (08/17 0500) Resp:  [16-22] 20 (08/17 0500) BP: (139-184)/(52-87) 139/52 (08/17 0500) SpO2:  [90 %-100 %] 100 % (08/17 0500) FiO2 (%):  [98 %] 98 % (08/17 0400) Last BM Date: 06/08/16  Intake/Output from previous day: 08/16 0701 - 08/17 0700 In: 4447.9 [I.V.:4297.9; IV Piggyback:150] Out: 1150 [Urine:900; Stool:50; Blood:200]  Exam:  Her abdomen is soft with minimal abdominal wall drainage. Her ostomy is pink and viable with no ostomy function as yet. He has hypoactive bowel sounds. She's breathing spontaneously with no increased respiratory effort and has clear breath sounds bilaterally  Lab Results:  CBC  Recent Labs  06/09/16 0510 06/10/16 0456  WBC 20.1* 18.8*  HGB 9.8* 8.8*  HCT 30.3* 26.8*  PLT 200 186   CMP     Component Value Date/Time   NA 137 06/10/2016 0456   NA 145 05/22/2014 0712   K 4.6 06/10/2016 0456   K 3.9 05/22/2014 0712   CL 110 06/10/2016 0456   CL 110 (H) 05/22/2014 0712   CO2 20 (L) 06/10/2016 0456   CO2 29 05/22/2014 0712   GLUCOSE 152 (H) 06/10/2016 0456   GLUCOSE 61 (L) 05/22/2014 0712   BUN 37 (H) 06/10/2016 0456   BUN 28 (H) 05/22/2014 0712   CREATININE 1.97 (H) 06/10/2016 0456   CREATININE 1.75 (H) 05/22/2014 0712   CALCIUM 7.8 (L) 06/10/2016 0456   CALCIUM 7.9 (L) 05/22/2014 0712   PROT 8.3 (H) 06/08/2016 0158   PROT 6.6 05/20/2014 1246   ALBUMIN 4.2 06/08/2016 0158   ALBUMIN 3.0 (L)  05/20/2014 1246   AST 23 06/08/2016 0158   AST 14 (L) 05/20/2014 1246   ALT 11 (L) 06/08/2016 0158   ALT 27 05/20/2014 1246   ALKPHOS 59 06/08/2016 0158   ALKPHOS 54 05/20/2014 1246   BILITOT 0.8 06/08/2016 0158   BILITOT 0.6 05/20/2014 1246   GFRNONAA 25 (L) 06/10/2016 0456   GFRNONAA 29 (L) 05/22/2014 0712   GFRAA 28 (L) 06/10/2016 0456   GFRAA 34 (L) 05/22/2014 0712   PT/INR No results for input(s): LABPROT, INR in the last 72 hours.  Studies/Results: Dg Chest Port 1 View  Result Date: 06/09/2016 CLINICAL DATA:  Central line placement EXAM: PORTABLE CHEST 1 VIEW COMPARISON:  11/05/2015 FINDINGS: Mild cardiomegaly. Mediastinal shadows are normal. Right internal jugular central line has its tip in the SVC just below the azygos level. No pneumothorax or hemothorax. Lungs clear. IMPRESSION: Central line tip in the SVC just below the azygos level, 3 cm above the right atrium. No complication. Electronically Signed   By: Nelson Chimes M.D.   On: 06/09/2016 15:44    Assessment/Plan: She appears to be stable at this point. We will start TPN today as I anticipate a prolonged period of bowel recovery for normal nutrition and she has been several days without normal nutrition. We'll continue to follow her hemoglobin and white blood cell count. Plan at this point  would be to continue her antibiotics. She is stable at this time.

## 2016-06-10 NOTE — Progress Notes (Signed)
Inpatient Diabetes Program Recommendations  AACE/ADA: New Consensus Statement on Inpatient Glycemic Control (2015)  Target Ranges:  Prepandial:   less than 140 mg/dL      Peak postprandial:   less than 180 mg/dL (1-2 hours)      Critically ill patients:  140 - 180 mg/dL   Lab Results  Component Value Date   GLUCAP 136 (H) 06/10/2016   HGBA1C 10.4 (H) 11/05/2015    Review of Glycemic Control  Results for PATRISE, [name] (MRN [id number]) as of 06/10/2016 12:33  Ref. Range 06/09/2016 17:31 06/09/2016 19:52 06/09/2016 23:50 06/10/2016 04:57 06/10/2016 08:04  Glucose-Capillary Latest Ref Range: 65 - 99 mg/dL 262 (H) 222 (H) 150 (H) 152 (H) 136 (H)    Diabetes history: Type 2 Outpatient Diabetes medications: Novolog 12 units tid, Lantus 25 units qhs Current orders for Inpatient glycemic control: Novolog 0-20 units q4h  Inpatient Diabetes Program Recommendations:  Agree with current orders for blood sugar management   Gentry Fitz, RN, [place], [place], CDE Diabetes Coordinator Inpatient Diabetes Program  [phone] (Team Pager) [phone] ([place]) 06/10/2016 12:37 PM

## 2016-06-10 NOTE — Progress Notes (Signed)
Initial Nutrition Assessment  DOCUMENTATION CODES:   Not applicable  INTERVENTION:  -Recommend TPN of 5%AA/20% dextrose with electrolytes at goal rate of 41ml/hr with MVI and trace elements with 20% lipids 2 times per week. Will provide 1763 kcals, 84 g of protein and 1677ml fluid (plus additional 566ml 2 times per week with lipids). Recommend starting at 45ml/hr at 1800 tonight.  Pharmacy to follow as well, spoke with Jarrett Soho in pharmacy.  Discussed with Dr. Pat Patrick.  Would not recommend adding lipids until pt tolerating TPN at goal rate. Further adjustments to TPN will be made based on tolerance.    NUTRITION DIAGNOSIS:   Inadequate oral intake related to altered GI function as evidenced by NPO status.    GOAL:   Patient will meet greater than or equal to 90% of their needs    MONITOR:   Diet advancement (TPN)  REASON FOR ASSESSMENT:   Consult New TPN/TNA  ASSESSMENT:      Pt admitted with perforated sigmoid diverticulitis, s/p colon resection with loop ileostomy performed yesterday.  Past Medical History:  Diagnosis Date  . Arthritis    Inflammatory  . CHF (congestive heart failure) ([place])   . Chronic renal insufficiency February 07, 2015  . Coronary artery dilation ([place])   . Diabetes mellitus without complication ([place])    Type II  . Hyperlipidemia   . Hypertension   . Renal insufficiency   . Sinus problem   . Thyroid disease   . Ulcer of left lower leg ([place]) February 07, 2015   Pt reports appetite has been decreased for the past week prior to admission, eating 50% of normal intake.     Medications reviewed: aspart, mag sulfate, 1/4 NS with KCL at 170ml/hr Labs reviewed: BUN 37, creatinine 1.97 (improved from 2), phosphorus 3.4, Mg 1.4  (supplemented), glucose 152   Nutrition-Focused physical exam completed. Findings are no fat depletion, no muscle depletion, and no edema.     Diet Order:  Diet NPO time specified  Skin:  Reviewed, no issues  Last BM:   8/15  Height:   Ht Readings from Last 1 Encounters:  06/08/16 5\' 5"  (1.651 m)    Weight:  Pt reports stable wt prior to admission  Wt Readings from Last 1 Encounters:  06/08/16 218 lb 4.8 oz (99 kg)    Ideal Body Weight:     BMI:  Body mass index is 36.33 kg/m.  Estimated Nutritional Needs:   Kcal:  1500-1900 kcals/d  Protein:  75-95 g/d  Fluid:  >/= 1570ml/d  EDUCATION NEEDS:   No education needs identified at this time  Frannie Shedrick B. Zenia Resides, [place], [place] (pager) Weekend/On-Call pager [phone])

## 2016-06-11 ENCOUNTER — Inpatient Hospital Stay: Payer: Commercial Managed Care - HMO

## 2016-06-11 LAB — COMPREHENSIVE METABOLIC PANEL
ALT: 10 U/L — AB (ref 14–54)
ANION GAP: 7 (ref 5–15)
AST: 20 U/L (ref 15–41)
Albumin: 2.8 g/dL — ABNORMAL LOW (ref 3.5–5.0)
Alkaline Phosphatase: 67 U/L (ref 38–126)
BUN: 34 mg/dL — ABNORMAL HIGH (ref 6–20)
CHLORIDE: 108 mmol/L (ref 101–111)
CO2: 20 mmol/L — AB (ref 22–32)
Calcium: 8.6 mg/dL — ABNORMAL LOW (ref 8.9–10.3)
Creatinine, Ser: 1.95 mg/dL — ABNORMAL HIGH (ref 0.44–1.00)
GFR calc non Af Amer: 25 mL/min — ABNORMAL LOW (ref >60)
GFR, EST AFRICAN AMERICAN: 29 mL/min — AB (ref >60)
Glucose, Bld: 251 mg/dL — ABNORMAL HIGH (ref 65–99)
Potassium: 4.4 mmol/L (ref 3.5–5.1)
SODIUM: 135 mmol/L (ref 135–145)
Total Bilirubin: 0.7 mg/dL (ref 0.3–1.2)
Total Protein: 6.2 g/dL — ABNORMAL LOW (ref 6.5–8.1)

## 2016-06-11 LAB — TRIGLYCERIDES: TRIGLYCERIDES: 71 mg/dL (ref <150)

## 2016-06-11 LAB — CBC
HCT: 25.9 % — ABNORMAL LOW (ref 35.0–47.0)
HEMOGLOBIN: 8.5 g/dL — AB (ref 12.0–16.0)
MCH: 29.1 pg (ref 26.0–34.0)
MCHC: 32.8 g/dL (ref 32.0–36.0)
MCV: 88.9 fL (ref 80.0–100.0)
PLATELETS: 186 10*3/uL (ref 150–440)
RBC: 2.92 MIL/uL — AB (ref 3.80–5.20)
RDW: 15.3 % — ABNORMAL HIGH (ref 11.5–14.5)
WBC: 13.5 10*3/uL — ABNORMAL HIGH (ref 3.6–11.0)

## 2016-06-11 LAB — GLUCOSE, CAPILLARY
GLUCOSE-CAPILLARY: 246 mg/dL — AB (ref 65–99)
Glucose-Capillary: 215 mg/dL — ABNORMAL HIGH (ref 65–99)
Glucose-Capillary: 249 mg/dL — ABNORMAL HIGH (ref 65–99)
Glucose-Capillary: 374 mg/dL — ABNORMAL HIGH (ref 65–99)
Glucose-Capillary: 440 mg/dL — ABNORMAL HIGH (ref 65–99)

## 2016-06-11 LAB — MAGNESIUM: MAGNESIUM: 1.8 mg/dL (ref 1.7–2.4)

## 2016-06-11 LAB — PHOSPHORUS: Phosphorus: 2.1 mg/dL — ABNORMAL LOW (ref 2.5–4.6)

## 2016-06-11 MED ORDER — INSULIN GLARGINE 100 UNIT/ML ~~LOC~~ SOLN
15.0000 [IU] | Freq: Every day | SUBCUTANEOUS | Status: DC
  Filled 2016-06-11: qty 0.15

## 2016-06-11 MED ORDER — ALBUTEROL SULFATE (2.5 MG/3ML) 0.083% IN NEBU: 2.5000 mg | INHALATION_SOLUTION | Freq: Once | RESPIRATORY_TRACT | Status: DC

## 2016-06-11 MED ORDER — METOPROLOL TARTRATE 5 MG/5ML IV SOLN
5.0000 mg | Freq: Once | INTRAVENOUS | Status: AC
  Administered 2016-06-11: 5 mg via INTRAVENOUS
  Filled 2016-06-11: qty 5

## 2016-06-11 MED ORDER — INSULIN GLARGINE 100 UNIT/ML ~~LOC~~ SOLN
20.0000 [IU] | Freq: Every day | SUBCUTANEOUS | Status: DC
  Administered 2016-06-11 – 2016-06-12 (×2): 20 [IU] via SUBCUTANEOUS
  Filled 2016-06-11 (×2): qty 0.2

## 2016-06-11 MED ORDER — TRACE MINERALS CR-CU-MN-SE-ZN 10-1000-500-60 MCG/ML IV SOLN
INTRAVENOUS | Status: AC
  Administered 2016-06-11: 17:00:00 via INTRAVENOUS
  Filled 2016-06-11: qty 1800

## 2016-06-11 MED ORDER — INSULIN ASPART 100 UNIT/ML ~~LOC~~ SOLN
24.0000 [IU] | Freq: Once | SUBCUTANEOUS | Status: AC
  Administered 2016-06-11: 24 [IU] via SUBCUTANEOUS
  Filled 2016-06-11: qty 24

## 2016-06-11 MED ORDER — HYDROMORPHONE HCL 1 MG/ML IJ SOLN: 1.0000 mg | INTRAMUSCULAR | Status: DC | PRN

## 2016-06-11 NOTE — Consult Note (Addendum)
[place] [facility]                                                                                                                                                                                            Patient Demographics   [name], is a 70 y.o. female, DOB - Feb 27, 1946, [id number]  Admit date - 06/08/2016   Admitting Physician Clayburn Pert, MD  Outpatient Primary MD for the patient is Singh,[name], MD   LOS - 3  Subjective Patient short of breath and wheezing overnight currently feeling little better denies any chest pains or palpitations     Review of Systems:   CONSTITUTIONAL: No documented fever. No fatigue, weakness. No weight gain, no weight loss.  EYES: No blurry or double vision.  ENT: No tinnitus. No postnasal drip. No redness of the oropharynx.  RESPIRATORY: No cough, Positive wheeze, no hemoptysis. No dyspnea.  CARDIOVASCULAR: No chest pain. No orthopnea. No palpitations. No syncope.  GASTROINTESTINAL: No nausea, no vomiting or diarrhea. Improved abdominal pain. No melena or hematochezia.  GENITOURINARY: No dysuria or hematuria.  ENDOCRINE: No polyuria or nocturia. No heat or cold intolerance.  HEMATOLOGY: No anemia. No bruising. No bleeding.  INTEGUMENTARY: No rashes. No lesions.  MUSCULOSKELETAL: No arthritis. No swelling. No gout.  NEUROLOGIC: No numbness, tingling, or ataxia. No seizure-type activity.  PSYCHIATRIC: No anxiety. No insomnia. No ADD.    Vitals:   Vitals:   06/11/16 0145 06/11/16 0356 06/11/16 0415 06/11/16 0757  BP: (!) 151/67 (!) 157/97    Pulse: (!) 105 (!) 121 (!) 113   Resp:  (!) 22 19   Temp:  98.7 F (37.1 C)    TempSrc:  Oral    SpO2:  94% 96% 97%  Weight:   99.8 kg (220 lb 1.6 oz)   Height:        Wt Readings from Last 3 Encounters:  06/11/16 99.8 kg (220 lb 1.6 oz)  02/17/16 99.3 kg (219 lb)  11/05/15 97.7 kg (215 lb 5 oz)     Intake/Output Summary (Last 24 hours)  at 06/11/16 1227 Last data filed at 06/11/16 0703  Gross per 24 hour  Intake             3370 ml  Output             1350 ml  Net             2020 ml    Physical Exam:   GENERAL: Pleasant-appearing in no apparent distress.  HEAD, EYES, EARS, NOSE AND THROAT: Atraumatic, normocephalic. Extraocular muscles are intact. Pupils  equal and reactive to light. Sclerae anicteric. No conjunctival injection. No oro-pharyngeal erythema.  NECK: Supple. There is no jugular venous distention. No bruits, no lymphadenopathy, no thyromegaly.  HEART: Regular rate and rhythm,. No murmurs, no rubs, no clicks.  LUNGS: Clear to auscultation bilaterally. No rales or rhonchi. No wheezes.  ABDOMEN: Postop nondistended  EXTREMITIES: No evidence of any cyanosis, clubbing, or peripheral edema.  +2 pedal and radial pulses bilaterally.  NEUROLOGIC: The patient is alert, awake, and oriented x3 with no focal motor or sensory deficits appreciated bilaterally.  SKIN: Moist and warm with no rashes appreciated.  Psych: Not anxious, depressed LN: No inguinal LN enlargement    Antibiotics   Anti-infectives    Start     Dose/Rate Route Frequency Ordered Stop   06/10/16 1015  imipenem-cilastatin (PRIMAXIN) 500 mg in sodium chloride 0.9 % 100 mL IVPB     500 mg 200 mL/hr over 30 Minutes Intravenous Every 8 hours 06/10/16 1011     06/09/16 0930  imipenem-cilastatin (PRIMAXIN) 500 mg in sodium chloride 0.9 % 100 mL IVPB  Status:  Discontinued     500 mg 200 mL/hr over 30 Minutes Intravenous Every 12 hours 06/09/16 0741 06/10/16 1011   06/08/16 0600  imipenem-cilastatin (PRIMAXIN) 300 mg in sodium chloride 0.9 % 100 mL IVPB  Status:  Discontinued     300 mg 200 mL/hr over 30 Minutes Intravenous Every 6 hours 06/08/16 0519 06/09/16 0741      Medications   Scheduled Meds: . albuterol  2.5 mg Nebulization Once  . amLODipine  10 mg Oral Daily  . enoxaparin (LOVENOX) injection  40 mg Subcutaneous Q24H  . famotidine  (PEPCID) IV  20 mg Intravenous Q24H  . hydrALAZINE  25 mg Oral Q8H  . imipenem-cilastatin  500 mg Intravenous Q8H  . insulin aspart  0-20 Units Subcutaneous Q4H  . [START ON 06/12/2016] insulin glargine  20 Units Subcutaneous Daily  . lisinopril  20 mg Oral Daily  . metoprolol tartrate  50 mg Oral BID   Continuous Infusions: . Marland KitchenTPN (CLINIMIX-E) Adult 75 mL/hr at 06/11/16 0816  . Marland KitchenTPN (CLINIMIX-E) Adult     PRN Meds:.acetaminophen, hydrALAZINE, HYDROmorphone (DILAUDID) injection, ipratropium-albuterol, ondansetron **OR** ondansetron (ZOFRAN) IV   Data Review:   Micro Results Recent Results (from the past 240 hour(s))  CULTURE, BLOOD (ROUTINE X 2) w Reflex to ID Panel     Status: None (Preliminary result)   Collection Time: 06/08/16  6:46 PM  Result Value Ref Range Status   Specimen Description BLOOD LEFT HAND  Final   Special Requests BOTTLES DRAWN AEROBIC AND ANAEROBIC 4ML  Final   Culture NO GROWTH 3 DAYS  Final   Report Status PENDING  Incomplete  CULTURE, BLOOD (ROUTINE X 2) w Reflex to ID Panel     Status: None (Preliminary result)   Collection Time: 06/08/16  7:03 PM  Result Value Ref Range Status   Specimen Description BLOOD LEFT HAND  Final   Special Requests BOTTLES DRAWN AEROBIC AND ANAEROBIC 5ML  Final   Culture NO GROWTH 3 DAYS  Final   Report Status PENDING  Incomplete  Aerobic/Anaerobic Culture (surgical/deep wound)     Status: None (Preliminary result)   Collection Time: 06/09/16 10:02 AM  Result Value Ref Range Status   Specimen Description ABDOMEN  Final   Special Requests NONE  Final   Gram Stain   Final    MODERATE WBC PRESENT,BOTH PMN AND MONONUCLEAR FEW GRAM NEGATIVE RODS RARE GRAM POSITIVE  COCCI IN PAIRS RARE SQUAMOUS EPITHELIAL CELLS PRESENT    Culture   Final    FEW ESCHERICHIA COLI CULTURE REINCUBATED FOR BETTER GROWTH Performed at [facility]    Report Status PENDING  Incomplete   Organism ID, Bacteria ESCHERICHIA COLI  Final       Susceptibility   Escherichia coli - MIC*    AMPICILLIN 4 SENSITIVE Sensitive     CEFAZOLIN <=4 SENSITIVE Sensitive     CEFEPIME <=1 SENSITIVE Sensitive     CEFTAZIDIME <=1 SENSITIVE Sensitive     CEFTRIAXONE <=1 SENSITIVE Sensitive     CIPROFLOXACIN <=0.25 SENSITIVE Sensitive     GENTAMICIN <=1 SENSITIVE Sensitive     IMIPENEM <=0.25 SENSITIVE Sensitive     TRIMETH/SULFA <=20 SENSITIVE Sensitive     AMPICILLIN/SULBACTAM 4 SENSITIVE Sensitive     PIP/TAZO <=4 SENSITIVE Sensitive     Extended ESBL NEGATIVE Sensitive     * FEW ESCHERICHIA COLI    Radiology Reports Ct Abdomen Pelvis W Contrast  Result Date: 06/08/2016 CLINICAL DATA:  Urinary frequency for 2 weeks. Pelvic and suprapubic pain since yesterday. Low back pain. Nausea. EXAM: CT ABDOMEN AND PELVIS WITH CONTRAST TECHNIQUE: Multidetector CT imaging of the abdomen and pelvis was performed using the standard protocol following bolus administration of intravenous contrast. CONTRAST:  50mL ISOVUE-300 IOPAMIDOL (ISOVUE-300) INJECTION 61% COMPARISON:  None. FINDINGS: Dependent atelectasis in the lung bases. There is pneumoperitoneum with free intra-abdominal air throughout the abdomen and pelvis. Mild infiltration in the mesentery. Small amount of free fluid in the pelvis. Diverticula in the sigmoid colon with inflammatory stranding around the proximal sigmoid colon. Small pericolonic abscess measuring 1.4 x 2.7 cm. Changes are consistent with perforated acute diverticulitis. No small or large bowel distention. Small esophageal hiatal hernia. The liver, spleen, gallbladder, pancreas, adrenal glands, kidneys, abdominal aorta, inferior vena cava, and retroperitoneal lymph nodes are unremarkable. Pelvis: Bladder wall is not thickened. No pelvic mass or lymphadenopathy. Appendix is normal. No destructive bone lesions. IMPRESSION: Perforated acute diverticulitis with diffuse free intraperitoneal air. Small pericolonic abscess adjacent to the junction of  the descending and sigmoid colon. These results were called by telephone at the time of interpretation on 06/08/2016 at 3:07 am to Dr. Charlesetta Ivory , who verbally acknowledged these results. Electronically Signed   By: Lucienne Capers M.D.   On: 06/08/2016 03:28   Dg Chest Port 1 View  Result Date: 06/11/2016 CLINICAL DATA:  Onset of wheezing today; history of CHF, coronary artery disease, former smoker EXAM: PORTABLE CHEST 1 VIEW COMPARISON:  PA and lateral chest x-ray of June 09, 2016 FINDINGS: The lungs are adequately inflated. There is no focal infiltrate. There is no pleural effusion or pneumothorax. The cardiac silhouette remains enlarged. The pulmonary vascularity is slightly more conspicuous and mild interstitial edema on the right is suspected. The right internal jugular venous catheter tip projects over the midportion of the SVC. The bony thorax exhibits no acute abnormality. IMPRESSION: Low-grade CHF with mild interstitial edema more conspicuous than on yesterday's study. No alveolar pneumonia. Electronically Signed   By: David  Martinique M.D.   On: 06/11/2016 08:45   Dg Chest Port 1 View  Result Date: 06/09/2016 CLINICAL DATA:  Central line placement EXAM: PORTABLE CHEST 1 VIEW COMPARISON:  11/05/2015 FINDINGS: Mild cardiomegaly. Mediastinal shadows are normal. Right internal jugular central line has its tip in the SVC just below the azygos level. No pneumothorax or hemothorax. Lungs clear. IMPRESSION: Central line tip in the SVC just below  the azygos level, 3 cm above the right atrium. No complication. Electronically Signed   By: Nelson Chimes M.D.   On: 06/09/2016 15:44     CBC  Recent Labs Lab 06/08/16 0158 06/09/16 0510 06/10/16 0456 06/11/16 0503  WBC 18.6* 20.1* 18.8* 13.5*  HGB 11.7* 9.8* 8.8* 8.5*  HCT 35.4 30.3* 26.8* 25.9*  PLT 189 200 186 186  MCV 87.2 89.5 88.1 88.9  MCH 28.9 28.8 29.1 29.1  MCHC 33.1 32.2 33.0 32.8  RDW 15.2* 15.4* 15.6* 15.3*    Chemistries    Recent Labs Lab 06/08/16 0158 06/09/16 0510 06/10/16 0456 06/11/16 0503  NA 138 137 137 135  K 3.9 3.7 4.6 4.4  CL 104 103 110 108  CO2 24 23 20* 20*  GLUCOSE 65 193* 152* 251*  BUN 34* 37* 37* 34*  CREATININE 1.82* 2.39* 1.97* 1.95*  CALCIUM 9.8 9.0 7.8* 8.6*  MG  --  1.5* 1.4* 1.8  AST 23  --   --  20  ALT 11*  --   --  10*  ALKPHOS 59  --   --  67  BILITOT 0.8  --   --  0.7   ------------------------------------------------------------------------------------------------------------------ estimated creatinine clearance is 31.4 mL/min (by C-G formula based on SCr of 1.95 mg/dL). ------------------------------------------------------------------------------------------------------------------ No results for input(s): HGBA1C in the last 72 hours. ------------------------------------------------------------------------------------------------------------------  Recent Labs  06/11/16 0503  TRIG 71   ------------------------------------------------------------------------------------------------------------------ No results for input(s): TSH, T4TOTAL, T3FREE, THYROIDAB in the last 72 hours.  Invalid input(s): FREET3 ------------------------------------------------------------------------------------------------------------------ No results for input(s): VITAMINB12, FOLATE, FERRITIN, TIBC, IRON, RETICCTPCT in the last 72 hours.  Coagulation profile No results for input(s): INR, PROTIME in the last 168 hours.  No results for input(s): DDIMER in the last 72 hours.  Cardiac Enzymes No results for input(s): CKMB, TROPONINI, MYOGLOBIN in the last 168 hours.  Invalid input(s): CK ------------------------------------------------------------------------------------------------------------------ Invalid input(s): [place]   [name]  is a 70 y.o. female with a known history of Hypertension, insulin-dependent diabetes mellitus, CK D, rheumatoid  arthritis, hypothyroidism presents to the hospital secondary to worsening abdominal pain and noted to have perforated sigmoid diverticulitis.  #1Central hypertension Currently stable -Continue Norvasc, metoprolol, oral hydralazine and lisinopril. -Continue to hold hydrochlorothiazide and Lasix -IV hydralazine as needed.  #2 diabetes mellitus-Blood sugar still elevated I'll increase the Lantus. -Continue sliding scale for now. 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 On very low-dose prednisone for maintenance and also recent vasculitis episode. Currently being held  #5 perforated sigmoid status post surgery Continue IV antibiotics  #6 acute on chronic renal failure renal function improved close to her baseline continue to monitor what nephrotoxins  #7 hypomagnesemia we'll replace her magnesium     Code Status Orders        Start     Ordered   06/08/16 0631  Full code  Continuous     06/08/16 0630    Code Status History    Date Active Date Inactive Code Status Order ID Comments User Context   11/05/2015 10:26 AM 11/10/2015  5:22 PM Full Code [id number]  Saundra Shelling, MD ED   11/05/2015  7:14 AM 11/05/2015 10:26 AM Full Code [id number]  Saundra Shelling, MD ED  DVT Prophylaxis SCDs  Lab Results  Component Value Date   PLT 186 06/11/2016     Time Spent in minutes 25 minute Greater than 50% of time spent in care coordination and counseling patient regarding the condition and plan of care.   Dustin Flock M.D on 06/11/2016 at 12:27 PM  Between 7am to 6pm - Pager - [phone]  After 6pm go to [email or web address] - password [email or web address] [facility]   Office  [phone]

## 2016-06-11 NOTE — Progress Notes (Signed)
Subjective:   She is less lethargic this morning more responsive and conversant. She continues to have abdominal discomfort. She has had no nausea or vomiting. She is complaining of some mild wheezing this morning. Her oxygen saturation has been satisfactory and her vital signs are stable. She is mildly tachycardic.  Her urine output has improved but her creatinine is still just 2.0. She does not have any bowel function yet although there is small amount of stool in the ostomy site itself.  Vital signs in last 24 hours: Temp:  [98.7 F (37.1 C)-99.8 F (37.7 C)] 98.7 F (37.1 C) (08/18 0356) Pulse Rate:  [105-121] 113 (08/18 0415) Resp:  [11-22] 19 (08/18 0415) BP: (144-175)/(56-97) 157/97 (08/18 0356) SpO2:  [94 %-100 %] 96 % (08/18 0415) Weight:  [99.8 kg (220 lb 1.6 oz)] 99.8 kg (220 lb 1.6 oz) (08/18 0415) Last BM Date: 06/08/16  Intake/Output from previous day: 08/17 0701 - 08/18 0700 In: 3889 [I.V.:3639; IV Piggyback:250] Out: 1600 [Urine:1400; Stool:200]  Exam:  Her abdomen looks good with no significant abdominal distention. She does have good bowel sounds. Her ostomy is viable not functioning as yet. Her lungs to have some mild expiratory wheezes with no obvious shortness of breath. She remains tachycardic.  Lab Results:  CBC  Recent Labs  06/10/16 0456 06/11/16 0503  WBC 18.8* 13.5*  HGB 8.8* 8.5*  HCT 26.8* 25.9*  PLT 186 186   CMP     Component Value Date/Time   NA 135 06/11/2016 0503   NA 145 05/22/2014 0712   K 4.4 06/11/2016 0503   K 3.9 05/22/2014 0712   CL 108 06/11/2016 0503   CL 110 (H) 05/22/2014 0712   CO2 20 (L) 06/11/2016 0503   CO2 29 05/22/2014 0712   GLUCOSE 251 (H) 06/11/2016 0503   GLUCOSE 61 (L) 05/22/2014 0712   BUN 34 (H) 06/11/2016 0503   BUN 28 (H) 05/22/2014 0712   CREATININE 1.95 (H) 06/11/2016 0503   CREATININE 1.75 (H) 05/22/2014 0712   CALCIUM 8.6 (L) 06/11/2016 0503   CALCIUM 7.9 (L) 05/22/2014 0712   PROT 6.2 (L)  06/11/2016 0503   PROT 6.6 05/20/2014 1246   ALBUMIN 2.8 (L) 06/11/2016 0503   ALBUMIN 3.0 (L) 05/20/2014 1246   AST 20 06/11/2016 0503   AST 14 (L) 05/20/2014 1246   ALT 10 (L) 06/11/2016 0503   ALT 27 05/20/2014 1246   ALKPHOS 67 06/11/2016 0503   ALKPHOS 54 05/20/2014 1246   BILITOT 0.7 06/11/2016 0503   BILITOT 0.6 05/20/2014 1246   GFRNONAA 25 (L) 06/11/2016 0503   GFRNONAA 29 (L) 05/22/2014 0712   GFRAA 29 (L) 06/11/2016 0503   GFRAA 34 (L) 05/22/2014 0712   PT/INR No results for input(s): LABPROT, INR in the last 72 hours.  Studies/Results: Dg Chest Port 1 View  Result Date: 06/09/2016 CLINICAL DATA:  Central line placement EXAM: PORTABLE CHEST 1 VIEW COMPARISON:  11/05/2015 FINDINGS: Mild cardiomegaly. Mediastinal shadows are normal. Right internal jugular central line has its tip in the SVC just below the azygos level. No pneumothorax or hemothorax. Lungs clear. IMPRESSION: Central line tip in the SVC just below the azygos level, 3 cm above the right atrium. No complication. Electronically Signed   By: Nelson Chimes M.D.   On: 06/09/2016 15:44    Assessment/Plan: Her white blood cell count is coming down. Her hemoglobin remained stable. Her creatinine is still mildly elevated. We'll advance her TPN today to near goal rate and  discontinue some of her IV fluids. We'll leave her Foley in place because of her urine monitoring and her elevated creatinine. We'll give her breathing treatment this morning chest x-ray to be sure there is no significant fluid overload as she is positive. Amlodipine concerned about her bowel function and we will increase her activity C we can get her moving about. We will continue her antibiotics. Her white blood cell count is coming down so I do think we are seeing resolution of her health Status. We'll continue her by mouth medications and started diet when she has to opportunity to regain some bowel function.

## 2016-06-11 NOTE — Consult Note (Signed)
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   Stomal assessment/size: 1 1/2" pink moist, flush with abdomen.  Some duskiness noted at 6 o'clock.  Anticipate this may slough off.  Stoma sweat and some blood tinged liquid in pouch.  Retention rod in place.  Peristomal assessment: Intact.  Flush stoma.  Will use barrier ring.  Treatment options for stomal/peristomal skin: Barrier ring.  May switch to 1 piece convex at next pouch change once stoma begins to function for maximum wear time.  Output Blood tinged liquid only at this time.  Ostomy pouching: 2pc. 2 1/4" used today, but snug with retention rod.  2 3/4" pouches placed in room with barrier rings.  Also considering a 1 piece convex if there are leakage issues.   Education provided: Patient is alert and oriented and states her pain is not too bad at this time.  Observed pouch removal, discussing rationale for rod and barrier ring.  Measured stoma, cut barrier to fit.  Explained that she will empty several times daily and would like to only have to change pouch twice weekly.   Has midline abdominal incision and was able to keep pouch away from periwound area. Encouraged patient to ask family to be present for Monday AM pouch change.  She will be residing with family after discharge.  A niece is a Marine scientist, patient reports.   Enrolled patient in [place] program: No  Will make product selection at next pouch change.  [place] team will follow and remain available to patient, medical and nursing teams.  Domenic Moras RN [name] [place] Pager [phone]

## 2016-06-11 NOTE — Progress Notes (Signed)
Inpatient Diabetes Program Recommendations  AACE/ADA: New Consensus Statement on Inpatient Glycemic Control (2015)  Target Ranges:  Prepandial:   less than 140 mg/dL      Peak postprandial:   less than 180 mg/dL (1-2 hours)      Critically ill patients:  140 - 180 mg/dL   Lab Results  Component Value Date   GLUCAP 215 (H) 06/11/2016   HGBA1C 10.4 (H) 11/05/2015    Review of Glycemic Control:  Results for LILI, KENDELL (MRN [id number]) as of 06/11/2016 08:50  Ref. Range 06/10/2016 19:43 06/11/2016 00:01 06/11/2016 03:51 06/11/2016 07:28  Glucose-Capillary Latest Ref Range: 65 - 99 mg/dL 172 (H) 236 (H) 246 (H) 215 (H)   Diabetes history: Type 2 Outpatient Diabetes medications: Novolog 12 units tid, Lantus 25 units qhs Current orders for Inpatient glycemic control: Novolog 0-20 units q4h  Inpatient Diabetes Program Recommendations:   Note that blood sugars increased with start of TPN.  May consider adding a portion of patient's home dose of basal insulin.  Consider Lantus 20 units daily.   Thanks, Adah Perl, RN, BC-ADM Inpatient Diabetes Coordinator Pager [phone] (8a-5p)

## 2016-06-11 NOTE — Progress Notes (Signed)
Nutrition Follow-up  DOCUMENTATION CODES:   Not applicable  INTERVENTION:  -Continue 5%AA/20% dextrose with electrolytes, MVI and trace elements at 10ml/hr.  Will plan to add 20% lipids on 8/21 (needed 2 times per week) pending continued tolerance to current TPN regimen.  Pt currently not at risk of essential fatty acid defiency. Spoke with pharmacist Jody this am and aware of rate increase of TPN.   TPN at rate of 61ml/hr with lipids 2 times per week will provide 1869 kcals/d, 90 g of protein, 1831ml fluids (with additional 523ml 2 times per week when lipids ordered)  NUTRITION DIAGNOSIS:   Inadequate oral intake related to altered GI function as evidenced by NPO status.    GOAL:   Patient will meet greater than or equal to 90% of their needs    MONITOR:   Diet advancement (TPN)  REASON FOR ASSESSMENT:   Consult New TPN/TNA  ASSESSMENT:      TPN continues via Right IJ at 44ml/hr, started at 64ml/hr last evening.   Medications reviewed: aspart, lantus Labs reviewed: phosphorus 2.1, Mg WDL, BUN 34, creatinine 1.95, glucose 251, FSBS 215, 246, 236  UOP: 1626ml   Diet Order:  Diet NPO time specified .TPN (CLINIMIX-E) Adult .TPN (CLINIMIX-E) Adult  Skin:  Reviewed, no issues  Last BM:  8/15  Height:   Ht Readings from Last 1 Encounters:  06/08/16 5\' 5"  (1.651 m)    Weight:   Wt Readings from Last 1 Encounters:  06/11/16 220 lb 1.6 oz (99.8 kg)    Ideal Body Weight:     BMI:  Body mass index is 36.63 kg/m.  Estimated Nutritional Needs:   Kcal:  1500-1900 kcals/d  Protein:  75-95 g/d  Fluid:  >/= 1582ml/d  EDUCATION NEEDS:   No education needs identified at this time  [name], [place], [place] (pager) Weekend/On-Call pager [phone])

## 2016-06-11 NOTE — Care Management (Signed)
Prior to this admission patient lived alone and Independent in all adls, denies issues accessing medical care, obtaining medications or with transportation.  Current with her PCP.  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  Patient is NPO and on TPN.  She continues on bedrest.  Spoke with patient and her daughter.  At discharge, patient will go to the home of her daughter  in [place].  Patient acknowledges that she is receiving some education from the wound care nurse regarding colostomy.  Patient wishes for her grand daughter to receive some education.  Briefly discussed good idea to have a back up caregiver but ultimately patient will need to be competent in the management of the ostomy. Patient appears overwhelmed at present time with current health circumstances and in pain and "miserable."  Provided list of home health agencies for agency preference if patient able to discharge directly home.  Discussed that CM would make sure agency chosen would be able to provide service in [facility] and [place].

## 2016-06-11 NOTE — Progress Notes (Signed)
Notified Md of FSBS orders taken

## 2016-06-11 NOTE — Progress Notes (Signed)
Pharmacy Antibiotic Note  [name] is a 70 y.o. female admitted on 06/08/2016 with IAI.  Pharmacy has been consulted for Primaxin dosing.  Plan: Will continue current orders for primaxin 500 mg IV q8h (CrCl 30-59 ml/min adjusted dose for 1000 mg IV q8h).   Height: 5\' 5"  (165.1 cm) Weight: 220 lb 1.6 oz (99.8 kg) IBW/kg (Calculated) : 57  Temp (24hrs), Avg:99.1 F (37.3 C), Min:98.7 F (37.1 C), Max:99.8 F (37.7 C)   Recent Labs Lab 06/08/16 0158 06/09/16 0510 06/10/16 0456 06/11/16 0503  WBC 18.6* 20.1* 18.8* 13.5*  CREATININE 1.82* 2.39* 1.97* 1.95*    Estimated Creatinine Clearance: 31.4 mL/min (by C-G formula based on SCr of 1.95 mg/dL).    Allergies  Allergen Reactions  . Doxycycline Other (See Comments)    Reaction: unknown  . Levaquin [Levofloxacin] Other (See Comments)    Causes joint pain  . Clindamycin/Lincomycin Itching    Petechia  . Penicillins Rash and Other (See Comments)    Has patient had a PCN reaction causing immediate rash, facial/tongue/throat swelling, SOB or lightheadedness with hypotension: Yes Has patient had a PCN reaction causing severe rash involving mucus membranes or skin necrosis: No Has patient had a PCN reaction that required hospitalization No Has patient had a PCN reaction occurring within the last 10 years: No If all of the above answers are "NO", then may proceed with Cephalosporin use.  . Sulfa Antibiotics Swelling and Rash    Thank you for allowing pharmacy to be a part of this patient's care.  Tyleek Smick C, Pharm.D., BCPS Clinical Pharmacist 06/11/2016 9:46 AM

## 2016-06-11 NOTE — Progress Notes (Signed)
PARENTERAL NUTRITION CONSULT NOTE - INITIAL  Pharmacy Consult for TPN Indication: colon resection, NPO  Allergies  Allergen Reactions  . Doxycycline Other (See Comments)    Reaction: unknown  . Levaquin [Levofloxacin] Other (See Comments)    Causes joint pain  . Clindamycin/Lincomycin Itching    Petechia  . Penicillins Rash and Other (See Comments)    Has patient had a PCN reaction causing immediate rash, facial/tongue/throat swelling, SOB or lightheadedness with hypotension: Yes Has patient had a PCN reaction causing severe rash involving mucus membranes or skin necrosis: No Has patient had a PCN reaction that required hospitalization No Has patient had a PCN reaction occurring within the last 10 years: No If all of the above answers are "NO", then may proceed with Cephalosporin use.  . Sulfa Antibiotics Swelling and Rash    Patient Measurements: Height: 5\' 5"  (165.1 cm) Weight: 220 lb 1.6 oz (99.8 kg) IBW/kg (Calculated) : 57   Vital Signs: Temp: 98.7 F (37.1 C) (08/18 0356) Temp Source: Oral (08/18 0356) BP: 157/97 (08/18 0356) Pulse Rate: 113 (08/18 0415) Intake/Output from previous day: 08/17 0701 - 08/18 0700 In: 3889 [I.V.:3639; IV Piggyback:250] Out: 1600 [Urine:1400; Stool:200] Intake/Output from this shift: Total I/O In: 0  Out: 100 [Urine:100]  Labs:  Recent Labs  06/09/16 0510 06/10/16 0456 06/11/16 0503  WBC 20.1* 18.8* 13.5*  HGB 9.8* 8.8* 8.5*  HCT 30.3* 26.8* 25.9*  PLT 200 186 186     Recent Labs  06/09/16 0510 06/10/16 0456 06/11/16 0503  NA 137 137 135  K 3.7 4.6 4.4  CL 103 110 108  CO2 23 20* 20*  GLUCOSE 193* 152* 251*  BUN 37* 37* 34*  CREATININE 2.39* 1.97* 1.95*  CALCIUM 9.0 7.8* 8.6*  MG 1.5* 1.4* 1.8  PHOS 3.3 3.4 2.1*  PROT  --   --  6.2*  ALBUMIN  --   --  2.8*  AST  --   --  20  ALT  --   --  10*  ALKPHOS  --   --  67  BILITOT  --   --  0.7  TRIG  --   --  71   Estimated Creatinine Clearance: 31.4 mL/min  (by C-G formula based on SCr of 1.95 mg/dL).    Recent Labs  06/11/16 0001 06/11/16 0351 06/11/16 0728  GLUCAP 236* 246* 215*    Insulin Requirements in the past 24 hours:  28 units SSI (on Novolog 0-20 SQ q4h) Pt on Lantus at home  Current Nutrition:  Clinimix E 5/20 at 37ml/hr  Assessment: 70 yo female s/p colon resection surgery for diverticulitis on 8/16 started TPN 8/17  K 4.4, Phos 2.1, Mag 1.8  Plan:  Patient started on Clinimix E 5/20 at 85ml/hr yesterday evening, rate increased to 58ml/hr this morning, maintenance fluids discontinued.   Phos slightly low, anticipated will come up with increased rate of TPN. No additional supplementation at this time.   Required 28 units of insulin in 24hrs, will start basal insulin today. Added lantus 15 units daily. Will continue to follow and adjust as needed.  Recheck electrolytes with AM labs.  Tymber Stallings C 06/11/2016,9:48 AM

## 2016-06-12 LAB — BASIC METABOLIC PANEL
Anion gap: 3 — ABNORMAL LOW (ref 5–15)
BUN: 33 mg/dL — AB (ref 6–20)
CO2: 23 mmol/L (ref 22–32)
CREATININE: 1.63 mg/dL — AB (ref 0.44–1.00)
Calcium: 9.1 mg/dL (ref 8.9–10.3)
Chloride: 110 mmol/L (ref 101–111)
GFR calc Af Amer: 36 mL/min — ABNORMAL LOW (ref >60)
GFR, EST NON AFRICAN AMERICAN: 31 mL/min — AB (ref >60)
GLUCOSE: 239 mg/dL — AB (ref 65–99)
Potassium: 4.3 mmol/L (ref 3.5–5.1)
SODIUM: 136 mmol/L (ref 135–145)

## 2016-06-12 LAB — GLUCOSE, CAPILLARY
GLUCOSE-CAPILLARY: 264 mg/dL — AB (ref 65–99)
GLUCOSE-CAPILLARY: 285 mg/dL — AB (ref 65–99)
GLUCOSE-CAPILLARY: 360 mg/dL — AB (ref 65–99)
Glucose-Capillary: 250 mg/dL — ABNORMAL HIGH (ref 65–99)
Glucose-Capillary: 302 mg/dL — ABNORMAL HIGH (ref 65–99)
Glucose-Capillary: 355 mg/dL — ABNORMAL HIGH (ref 65–99)

## 2016-06-12 LAB — PHOSPHORUS: Phosphorus: 1.4 mg/dL — ABNORMAL LOW (ref 2.5–4.6)

## 2016-06-12 LAB — CBC
HCT: 26.5 % — ABNORMAL LOW (ref 35.0–47.0)
Hemoglobin: 8.8 g/dL — ABNORMAL LOW (ref 12.0–16.0)
MCH: 29.1 pg (ref 26.0–34.0)
MCHC: 33.1 g/dL (ref 32.0–36.0)
MCV: 88.1 fL (ref 80.0–100.0)
PLATELETS: 227 10*3/uL (ref 150–440)
RBC: 3 MIL/uL — ABNORMAL LOW (ref 3.80–5.20)
RDW: 15.1 % — AB (ref 11.5–14.5)
WBC: 15.3 10*3/uL — ABNORMAL HIGH (ref 3.6–11.0)

## 2016-06-12 LAB — MAGNESIUM: MAGNESIUM: 1.9 mg/dL (ref 1.7–2.4)

## 2016-06-12 MED ORDER — FUROSEMIDE 40 MG PO TABS
40.0000 mg | ORAL_TABLET | Freq: Every day | ORAL | Status: DC
Start: 2016-06-12 — End: 2016-06-15
  Administered 2016-06-12 – 2016-06-14 (×2): 40 mg via ORAL
  Filled 2016-06-12 (×2): qty 1

## 2016-06-12 MED ORDER — PROMETHAZINE HCL 25 MG/ML IJ SOLN
12.5000 mg | Freq: Four times a day (QID) | INTRAMUSCULAR | Status: DC | PRN
  Administered 2016-06-12 – 2016-06-13 (×2): 12.5 mg via INTRAVENOUS
  Filled 2016-06-12 (×2): qty 1

## 2016-06-12 MED ORDER — TRACE MINERALS CR-CU-MN-SE-ZN 10-1000-500-60 MCG/ML IV SOLN
INTRAVENOUS | Status: AC
  Administered 2016-06-12: 18:00:00 via INTRAVENOUS
  Filled 2016-06-12: qty 1800

## 2016-06-12 MED ORDER — MORPHINE SULFATE (PF) 4 MG/ML IV SOLN
4.0000 mg | INTRAVENOUS | Status: DC | PRN
  Administered 2016-06-16: 4 mg via INTRAVENOUS
  Filled 2016-06-12: qty 1

## 2016-06-12 MED ORDER — INSULIN GLARGINE 100 UNIT/ML ~~LOC~~ SOLN
25.0000 [IU] | Freq: Every day | SUBCUTANEOUS | Status: DC
  Filled 2016-06-12: qty 0.25

## 2016-06-12 MED ORDER — METOPROLOL TARTRATE 5 MG/5ML IV SOLN: 5.0000 mg | Freq: Three times a day (TID) | INTRAVENOUS | Status: DC

## 2016-06-12 MED ORDER — SODIUM PHOSPHATES 45 MMOLE/15ML IV SOLN
30.0000 mmol | Freq: Once | INTRAVENOUS | Status: AC
  Administered 2016-06-12: 30 mmol via INTRAVENOUS
  Filled 2016-06-12: qty 10

## 2016-06-12 NOTE — Progress Notes (Addendum)
PARENTERAL NUTRITION CONSULT NOTE - INITIAL  Pharmacy Consult for TPN Indication: colon resection, NPO  Allergies  Allergen Reactions  . Doxycycline Other (See Comments)    Reaction: unknown  . Levaquin [Levofloxacin] Other (See Comments)    Causes joint pain  . Clindamycin/Lincomycin Itching    Petechia  . Penicillins Rash and Other (See Comments)    Has patient had a PCN reaction causing immediate rash, facial/tongue/throat swelling, SOB or lightheadedness with hypotension: Yes Has patient had a PCN reaction causing severe rash involving mucus membranes or skin necrosis: No Has patient had a PCN reaction that required hospitalization No Has patient had a PCN reaction occurring within the last 10 years: No If all of the above answers are "NO", then may proceed with Cephalosporin use.  . Sulfa Antibiotics Swelling and Rash    Patient Measurements: Height: 5\' 5"  (165.1 cm) Weight: 240 lb 12.8 oz (109.2 kg) IBW/kg (Calculated) : 57   Vital Signs: Temp: 98.3 F (36.8 C) (08/19 0336) Temp Source: Oral (08/19 0336) BP: 161/54 (08/19 0847) Pulse Rate: 92 (08/19 0847) Intake/Output from previous day: 08/18 0701 - 08/19 0700 In: 2465 [I.V.:1665; IV Piggyback:150] Out: 2350 [Urine:1850; Stool:500] Intake/Output from this shift: No intake/output data recorded.  Labs:  Recent Labs  06/10/16 0456 06/11/16 0503 06/12/16 0450  WBC 18.8* 13.5* 15.3*  HGB 8.8* 8.5* 8.8*  HCT 26.8* 25.9* 26.5*  PLT 186 186 227     Recent Labs  06/10/16 0456 06/11/16 0503 06/12/16 0450  NA 137 135 136  K 4.6 4.4 4.3  CL 110 108 110  CO2 20* 20* 23  GLUCOSE 152* 251* 239*  BUN 37* 34* 33*  CREATININE 1.97* 1.95* 1.63*  CALCIUM 7.8* 8.6* 9.1  MG 1.4* 1.8 1.9  PHOS 3.4 2.1* 1.4*  PROT  --  6.2*  --   ALBUMIN  --  2.8*  --   AST  --  20  --   ALT  --  10*  --   ALKPHOS  --  67  --   BILITOT  --  0.7  --   TRIG  --  71  --    Estimated Creatinine Clearance: 39.5 mL/min (by C-G  formula based on SCr of 1.63 mg/dL).    Recent Labs  06/11/16 2335 06/12/16 0512 06/12/16 0800  GLUCAP 440* 250* 264*    Insulin Requirements in the past 24 hours:  24 units of rapid insulin given 11 units SSI   Pt on Lantus at home  Current Nutrition:  Clinimix E 5/20 at 9ml/hr  Assessment: 70 yo female s/p colon resection surgery for diverticulitis on 8/16 started TPN 8/17 All electrolytes wnl except Phos is 1.4   Plan:  Patient started on Clinimix E 5/20 at 36ml/hr rate increased to 78ml/hr on Friday 8/18,  maintenance fluids discontinued.   Phos significantly low; therefore will replenish with NaPhos 30 mmol IV x 1.   Required 35 units of insulin thus far. Will continue Lantus dose for now.    Recheck electrolytes with AM labs.  Charitie Hinote D 06/12/2016,9:24 AM

## 2016-06-12 NOTE — Progress Notes (Signed)
Subjective:   She is nauseated this morning and having dry heaves. She's not vomited anything. She is mildly diaphoretic. Her pain is under reasonable control. She does have some ostomy function. She remains nothing by mouth on TPN.  Vital signs in last 24 hours: Temp:  [98.3 F (36.8 C)-98.4 F (36.9 C)] 98.3 F (36.8 C) (08/19 0336) Pulse Rate:  [76-97] 95 (08/19 0336) Resp:  [18-20] 20 (08/19 0336) BP: (144-196)/(71-80) 157/73 (08/19 0336) SpO2:  [96 %-98 %] 96 % (08/19 0336) Weight:  [109.2 kg (240 lb 12.8 oz)] 109.2 kg (240 lb 12.8 oz) (08/19 0357) Last BM Date: 06/08/16  Intake/Output from previous day: 08/18 0701 - 08/19 0700 In: 2465 [I.V.:1665; IV Piggyback:150] Out: [id number] [Urine:1850; Stool:500]  Exam:  Her lungs are clear. She's breathing easily with no shortness of breath and no wheezing this morning. Her abdomen is soft with no significant abdominal tenderness other than her incisional tenderness. The ostomy is viable although it is a bit dusky. Midline wound looks good and she's getting some serous drainage from her Penrose drains. She does have some bowel sounds.  Lab Results:  CBC  Recent Labs  06/11/16 0503 06/12/16 0450  WBC 13.5* 15.3*  HGB 8.5* 8.8*  HCT 25.9* 26.5*  PLT 186 227   CMP     Component Value Date/Time   NA 136 06/12/2016 0450   NA 145 05/22/2014 0712   K 4.3 06/12/2016 0450   K 3.9 05/22/2014 0712   CL 110 06/12/2016 0450   CL 110 (H) 05/22/2014 0712   CO2 23 06/12/2016 0450   CO2 29 05/22/2014 0712   GLUCOSE 239 (H) 06/12/2016 0450   GLUCOSE 61 (L) 05/22/2014 0712   BUN 33 (H) 06/12/2016 0450   BUN 28 (H) 05/22/2014 0712   CREATININE 1.63 (H) 06/12/2016 0450   CREATININE 1.75 (H) 05/22/2014 0712   CALCIUM 9.1 06/12/2016 0450   CALCIUM 7.9 (L) 05/22/2014 0712   PROT 6.2 (L) 06/11/2016 0503   PROT 6.6 05/20/2014 1246   ALBUMIN 2.8 (L) 06/11/2016 0503   ALBUMIN 3.0 (L) 05/20/2014 1246   AST 20 06/11/2016 0503   AST 14 (L)  05/20/2014 1246   ALT 10 (L) 06/11/2016 0503   ALT 27 05/20/2014 1246   ALKPHOS 67 06/11/2016 0503   ALKPHOS 54 05/20/2014 1246   BILITOT 0.7 06/11/2016 0503   BILITOT 0.6 05/20/2014 1246   GFRNONAA 31 (L) 06/12/2016 0450   GFRNONAA 29 (L) 05/22/2014 0712   GFRAA 36 (L) 06/12/2016 0450   GFRAA 34 (L) 05/22/2014 0712   PT/INR No results for input(s): LABPROT, INR in the last 72 hours.  Studies/Results: Dg Chest Port 1 View  Result Date: 06/11/2016 CLINICAL DATA:  Onset of wheezing today; history of CHF, coronary artery disease, former smoker EXAM: PORTABLE CHEST 1 VIEW COMPARISON:  PA and lateral chest x-ray of June 09, 2016 FINDINGS: The lungs are adequately inflated. There is no focal infiltrate. There is no pleural effusion or pneumothorax. The cardiac silhouette remains enlarged. The pulmonary vascularity is slightly more conspicuous and mild interstitial edema on the right is suspected. The right internal jugular venous catheter tip projects over the midportion of the SVC. The bony thorax exhibits no acute abnormality. IMPRESSION: Low-grade CHF with mild interstitial edema more conspicuous than on yesterday's study. No alveolar pneumonia. Electronically Signed   By: David  Martinique M.D.   On: 06/11/2016 08:45    Assessment/Plan: I am concerned that some of her nausea may be  related to her Dilaudid. We will switch her pain medication see if that makes difference. She's not really vomiting several think she has significant abdominal distention or gastric fullness. If she vomits a significant amount of fluid we will make the decision for possible nasogastric decompression. She does have a decreasing white blood cell count and appears to be doing well on her Primaxin. Overall I think her progress is excellent for day 3.

## 2016-06-12 NOTE — Consult Note (Signed)
[place] at [facility]                                                                                                                                                                                            Patient Demographics   [name], is a 70 y.o. female, DOB - 1946-07-03, [id number]  Admit date - 06/08/2016   Admitting Physician Clayburn Pert, MD  Outpatient Primary MD for the patient is Singh,[name], MD   LOS - 4  Subjective  Patient is drowsy due to pain medications. Does complain of nausea and abdominal pain.  Nothing by mouth except medications  Review of Systems:   CONSTITUTIONAL: No documented fever. Positive for fatigue and weakness. No weight gain, no weight loss.  EYES: No blurry or double vision.  ENT: No tinnitus. No postnasal drip. No redness of the oropharynx.  RESPIRATORY: No cough, Positive wheeze, no hemoptysis. No dyspnea.  CARDIOVASCULAR: No chest pain. No orthopnea. No palpitations. No syncope.  GASTROINTESTINAL: Nausea. No vomiting. Abdominal pain present. GENITOURINARY: No dysuria or hematuria.  ENDOCRINE: No polyuria or nocturia. No heat or cold intolerance.  HEMATOLOGY: No anemia. No bruising. No bleeding.  INTEGUMENTARY: No rashes. No lesions.  MUSCULOSKELETAL: No arthritis. No swelling. No gout.  NEUROLOGIC: No numbness, tingling, or ataxia. No seizure-type activity.  PSYCHIATRIC: No anxiety. No insomnia. No ADD.   Vitals:   Vitals:   06/11/16 2101 06/12/16 0336 06/12/16 0357 06/12/16 0847  BP: (!) 196/80 (!) 157/73  (!) 161/54  Pulse: 97 95  92  Resp: 18 20    Temp: 98.4 F (36.9 C) 98.3 F (36.8 C)    TempSrc: Oral Oral    SpO2: 98% 96%  98%  Weight:   109.2 kg (240 lb 12.8 oz)   Height:        Wt Readings from Last 3 Encounters:  06/12/16 109.2 kg (240 lb 12.8 oz)  02/17/16 99.3 kg (219 lb)  11/05/15 97.7 kg (215 lb 5 oz)     Intake/Output Summary (Last 24 hours) at 06/12/16  1133 Last data filed at 06/12/16 0520  Gross per 24 hour  Intake             2030 ml  Output             1250 ml  Net              780 ml    Physical Exam:   GENERAL: Pleasant-appearing in no apparent distress.  HEAD, EYES, EARS, NOSE AND THROAT: Atraumatic, normocephalic. Extraocular muscles are intact. Pupils equal and reactive to light.  Sclerae anicteric. No conjunctival injection. No oro-pharyngeal erythema.  NECK: Supple. There is no jugular venous distention. No bruits, no lymphadenopathy, no thyromegaly.  HEART: Regular rate and rhythm,. No murmurs, no rubs, no clicks.  LUNGS: Clear to auscultation bilaterally. No rales or rhonchi. No wheezes.  ABDOMEN: Postop nondistended  EXTREMITIES: No evidence of any cyanosis, clubbing, or peripheral edema.  +2 pedal and radial pulses bilaterally.  NEUROLOGIC: The patient is alert, awake, and oriented x3 with no focal motor or sensory deficits appreciated bilaterally.  SKIN: Moist and warm with no rashes appreciated.  Psych: Not anxious, depressed LN: No inguinal LN enlargement    Antibiotics   Anti-infectives    Start     Dose/Rate Route Frequency Ordered Stop   06/10/16 1015  imipenem-cilastatin (PRIMAXIN) 500 mg in sodium chloride 0.9 % 100 mL IVPB     500 mg 200 mL/hr over 30 Minutes Intravenous Every 8 hours 06/10/16 1011     06/09/16 0930  imipenem-cilastatin (PRIMAXIN) 500 mg in sodium chloride 0.9 % 100 mL IVPB  Status:  Discontinued     500 mg 200 mL/hr over 30 Minutes Intravenous Every 12 hours 06/09/16 0741 06/10/16 1011   06/08/16 0600  imipenem-cilastatin (PRIMAXIN) 300 mg in sodium chloride 0.9 % 100 mL IVPB  Status:  Discontinued     300 mg 200 mL/hr over 30 Minutes Intravenous Every 6 hours 06/08/16 0519 06/09/16 0741      Medications   Scheduled Meds: . albuterol  2.5 mg Nebulization Once  . amLODipine  10 mg Oral Daily  . enoxaparin (LOVENOX) injection  40 mg Subcutaneous Q24H  . famotidine (PEPCID) IV  20 mg  Intravenous Q24H  . hydrALAZINE  25 mg Oral Q8H  . imipenem-cilastatin  500 mg Intravenous Q8H  . insulin aspart  0-20 Units Subcutaneous Q4H  . [START ON 06/13/2016] insulin glargine  25 Units Subcutaneous Daily  . lisinopril  20 mg Oral Daily  . metoprolol tartrate  50 mg Oral BID  . sodium phosphate  Dextrose 5% IVPB  30 mmol Intravenous Once   Continuous Infusions: . Marland KitchenTPN (CLINIMIX-E) Adult 75 mL/hr at 06/12/16 0904  . Marland KitchenTPN (CLINIMIX-E) Adult     PRN Meds:.acetaminophen, hydrALAZINE, ipratropium-albuterol, morphine injection, ondansetron **OR** ondansetron (ZOFRAN) IV   Data Review:   Micro Results Recent Results (from the past 240 hour(s))  CULTURE, BLOOD (ROUTINE X 2) w Reflex to ID Panel     Status: None (Preliminary result)   Collection Time: 06/08/16  6:46 PM  Result Value Ref Range Status   Specimen Description BLOOD LEFT HAND  Final   Special Requests BOTTLES DRAWN AEROBIC AND ANAEROBIC 4ML  Final   Culture NO GROWTH 4 DAYS  Final   Report Status PENDING  Incomplete  CULTURE, BLOOD (ROUTINE X 2) w Reflex to ID Panel     Status: None (Preliminary result)   Collection Time: 06/08/16  7:03 PM  Result Value Ref Range Status   Specimen Description BLOOD LEFT HAND  Final   Special Requests BOTTLES DRAWN AEROBIC AND ANAEROBIC 5ML  Final   Culture NO GROWTH 4 DAYS  Final   Report Status PENDING  Incomplete  Aerobic/Anaerobic Culture (surgical/deep wound)     Status: None (Preliminary result)   Collection Time: 06/09/16 10:02 AM  Result Value Ref Range Status   Specimen Description ABDOMEN  Final   Special Requests NONE  Final   Gram Stain   Final    MODERATE WBC PRESENT,BOTH PMN AND MONONUCLEAR  FEW GRAM NEGATIVE RODS RARE GRAM POSITIVE COCCI IN PAIRS RARE SQUAMOUS EPITHELIAL CELLS PRESENT    Culture   Final    FEW ESCHERICHIA COLI CULTURE REINCUBATED FOR BETTER GROWTH Performed at [facility]    Report Status PENDING  Incomplete   Organism ID, Bacteria  ESCHERICHIA COLI  Final      Susceptibility   Escherichia coli - MIC*    AMPICILLIN 4 SENSITIVE Sensitive     CEFAZOLIN <=4 SENSITIVE Sensitive     CEFEPIME <=1 SENSITIVE Sensitive     CEFTAZIDIME <=1 SENSITIVE Sensitive     CEFTRIAXONE <=1 SENSITIVE Sensitive     CIPROFLOXACIN <=0.25 SENSITIVE Sensitive     GENTAMICIN <=1 SENSITIVE Sensitive     IMIPENEM <=0.25 SENSITIVE Sensitive     TRIMETH/SULFA <=20 SENSITIVE Sensitive     AMPICILLIN/SULBACTAM 4 SENSITIVE Sensitive     PIP/TAZO <=4 SENSITIVE Sensitive     Extended ESBL NEGATIVE Sensitive     * FEW ESCHERICHIA COLI    Radiology Reports Ct Abdomen Pelvis W Contrast  Result Date: 06/08/2016 CLINICAL DATA:  Urinary frequency for 2 weeks. Pelvic and suprapubic pain since yesterday. Low back pain. Nausea. EXAM: CT ABDOMEN AND PELVIS WITH CONTRAST TECHNIQUE: Multidetector CT imaging of the abdomen and pelvis was performed using the standard protocol following bolus administration of intravenous contrast. CONTRAST:  29mL ISOVUE-300 IOPAMIDOL (ISOVUE-300) INJECTION 61% COMPARISON:  None. FINDINGS: Dependent atelectasis in the lung bases. There is pneumoperitoneum with free intra-abdominal air throughout the abdomen and pelvis. Mild infiltration in the mesentery. Small amount of free fluid in the pelvis. Diverticula in the sigmoid colon with inflammatory stranding around the proximal sigmoid colon. Small pericolonic abscess measuring 1.4 x 2.7 cm. Changes are consistent with perforated acute diverticulitis. No small or large bowel distention. Small esophageal hiatal hernia. The liver, spleen, gallbladder, pancreas, adrenal glands, kidneys, abdominal aorta, inferior vena cava, and retroperitoneal lymph nodes are unremarkable. Pelvis: Bladder wall is not thickened. No pelvic mass or lymphadenopathy. Appendix is normal. No destructive bone lesions. IMPRESSION: Perforated acute diverticulitis with diffuse free intraperitoneal air. Small pericolonic  abscess adjacent to the junction of the descending and sigmoid colon. These results were called by telephone at the time of interpretation on 06/08/2016 at 3:07 am to Dr. Charlesetta Ivory , who verbally acknowledged these results. Electronically Signed   By: Lucienne Capers M.D.   On: 06/08/2016 03:28   Dg Chest Port 1 View  Result Date: 06/11/2016 CLINICAL DATA:  Onset of wheezing today; history of CHF, coronary artery disease, former smoker EXAM: PORTABLE CHEST 1 VIEW COMPARISON:  PA and lateral chest x-ray of June 09, 2016 FINDINGS: The lungs are adequately inflated. There is no focal infiltrate. There is no pleural effusion or pneumothorax. The cardiac silhouette remains enlarged. The pulmonary vascularity is slightly more conspicuous and mild interstitial edema on the right is suspected. The right internal jugular venous catheter tip projects over the midportion of the SVC. The bony thorax exhibits no acute abnormality. IMPRESSION: Low-grade CHF with mild interstitial edema more conspicuous than on yesterday's study. No alveolar pneumonia. Electronically Signed   By: David  Martinique M.D.   On: 06/11/2016 08:45   Dg Chest Port 1 View  Result Date: 06/09/2016 CLINICAL DATA:  Central line placement EXAM: PORTABLE CHEST 1 VIEW COMPARISON:  11/05/2015 FINDINGS: Mild cardiomegaly. Mediastinal shadows are normal. Right internal jugular central line has its tip in the SVC just below the azygos level. No pneumothorax or hemothorax. Lungs clear. IMPRESSION: Central  line tip in the SVC just below the azygos level, 3 cm above the right atrium. No complication. Electronically Signed   By: Nelson Chimes M.D.   On: 06/09/2016 15:44     CBC  Recent Labs Lab 06/08/16 0158 06/09/16 0510 06/10/16 0456 06/11/16 0503 06/12/16 0450  WBC 18.6* 20.1* 18.8* 13.5* 15.3*  HGB 11.7* 9.8* 8.8* 8.5* 8.8*  HCT 35.4 30.3* 26.8* 25.9* 26.5*  PLT 189 200 186 186 227  MCV 87.2 89.5 88.1 88.9 88.1  MCH 28.9 28.8 29.1 29.1  29.1  MCHC 33.1 32.2 33.0 32.8 33.1  RDW 15.2* 15.4* 15.6* 15.3* 15.1*    Chemistries   Recent Labs Lab 06/08/16 0158 06/09/16 0510 06/10/16 0456 06/11/16 0503 06/12/16 0450  NA 138 137 137 135 136  K 3.9 3.7 4.6 4.4 4.3  CL 104 103 110 108 110  CO2 24 23 20* 20* 23  GLUCOSE 65 193* 152* 251* 239*  BUN 34* 37* 37* 34* 33*  CREATININE 1.82* 2.39* 1.97* 1.95* 1.63*  CALCIUM 9.8 9.0 7.8* 8.6* 9.1  MG  --  1.5* 1.4* 1.8 1.9  AST 23  --   --  20  --   ALT 11*  --   --  10*  --   ALKPHOS 59  --   --  67  --   BILITOT 0.8  --   --  0.7  --    ------------------------------------------------------------------------------------------------------------------ estimated creatinine clearance is 39.5 mL/min (by C-G formula based on SCr of 1.63 mg/dL). ------------------------------------------------------------------------------------------------------------------ No results for input(s): HGBA1C in the last 72 hours. ------------------------------------------------------------------------------------------------------------------  Recent Labs  06/11/16 0503  TRIG 71   ------------------------------------------------------------------------------------------------------------------ No results for input(s): TSH, T4TOTAL, T3FREE, THYROIDAB in the last 72 hours.  Invalid input(s): FREET3 ------------------------------------------------------------------------------------------------------------------ No results for input(s): VITAMINB12, FOLATE, FERRITIN, TIBC, IRON, RETICCTPCT in the last 72 hours.  Coagulation profile No results for input(s): INR, PROTIME in the last 168 hours.  No results for input(s): DDIMER in the last 72 hours.  Cardiac Enzymes No results for input(s): CKMB, TROPONINI, MYOGLOBIN in the last 168 hours.  Invalid input(s): CK ------------------------------------------------------------------------------------------------------------------ Invalid input(s):  [place]   [name]  is a 70 y.o. female with a known history of Hypertension, insulin-dependent diabetes mellitus, CK D, rheumatoid arthritis, hypothyroidism presents to the hospital secondary to worsening abdominal pain and noted to have perforated sigmoid diverticulitis.  # Essential hypertension Spikes in blood pressure likely from pain. -Continue Norvasc, metoprolol, oral hydralazine and lisinopril. Able to tolerate pills. -Continue to hold hydrochlorothiazide -IV hydralazine as needed.  # Insulin-dependent diabetes mellitus We'll restart her home dose of Lantus as patient is on TPN. If patient is off TPN will need to reduce Lantus to 15 units.  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 We'll restart her home dose of oral Lasix per  # Rheumatoid arthritis-follows with rheumatology. On very low-dose prednisone for maintenance and also recent vasculitis episode. Currently being held  # Perforated sigmoid status post surgery Continue IV antibiotics  # Acute kidney injury over CKD stage III Improved and now close to baseline     Code Status Orders        Start     Ordered   06/08/16 0631  Full code  Continuous     06/08/16 0630    Code Status History    Date Active Date Inactive Code Status Order ID Comments User Context   11/05/2015 10:26  AM 11/10/2015  5:22 PM Full Code [id number]  Saundra Shelling, MD ED   11/05/2015  7:14 AM 11/05/2015 10:26 AM Full Code [id number]  Saundra Shelling, MD ED     DVT Prophylaxis SCDs  Lab Results  Component Value Date   PLT 227 06/12/2016   Time Spent in minutes 35 minute   Hillary Bow R M.D on 06/12/2016 at 11:33 AM  Between 7am to 6pm - Pager - [phone]  After 6pm go to [email or web address] - password [email or web address] [place] [facility]   Office  [phone]

## 2016-06-13 ENCOUNTER — Inpatient Hospital Stay: Payer: Commercial Managed Care - HMO

## 2016-06-13 LAB — GLUCOSE, CAPILLARY
GLUCOSE-CAPILLARY: 221 mg/dL — AB (ref 65–99)
Glucose-Capillary: 286 mg/dL — ABNORMAL HIGH (ref 65–99)
Glucose-Capillary: 309 mg/dL — ABNORMAL HIGH (ref 65–99)
Glucose-Capillary: 288 mg/dL — ABNORMAL HIGH (ref 65–99)
Glucose-Capillary: 196 mg/dL — ABNORMAL HIGH (ref 65–99)

## 2016-06-13 LAB — BASIC METABOLIC PANEL
ANION GAP: 7 (ref 5–15)
BUN: 37 mg/dL — ABNORMAL HIGH (ref 6–20)
CHLORIDE: 107 mmol/L (ref 101–111)
CO2: 22 mmol/L (ref 22–32)
CREATININE: 1.66 mg/dL — AB (ref 0.44–1.00)
Calcium: 9.1 mg/dL (ref 8.9–10.3)
GFR calc non Af Amer: 30 mL/min — ABNORMAL LOW (ref >60)
GFR, EST AFRICAN AMERICAN: 35 mL/min — AB (ref >60)
Glucose, Bld: 278 mg/dL — ABNORMAL HIGH (ref 65–99)
Potassium: 3.7 mmol/L (ref 3.5–5.1)
SODIUM: 136 mmol/L (ref 135–145)

## 2016-06-13 LAB — AEROBIC/ANAEROBIC CULTURE (SURGICAL/DEEP WOUND)

## 2016-06-13 LAB — CBC
HCT: 25.8 % — ABNORMAL LOW (ref 35.0–47.0)
Hemoglobin: 8.8 g/dL — ABNORMAL LOW (ref 12.0–16.0)
MCH: 29.3 pg (ref 26.0–34.0)
MCHC: 34.1 g/dL (ref 32.0–36.0)
MCV: 86 fL (ref 80.0–100.0)
Platelets: 245 10*3/uL (ref 150–440)
RBC: 3 MIL/uL — ABNORMAL LOW (ref 3.80–5.20)
RDW: 15.1 % — ABNORMAL HIGH (ref 11.5–14.5)
WBC: 15.5 10*3/uL — ABNORMAL HIGH (ref 3.6–11.0)

## 2016-06-13 LAB — CULTURE, BLOOD (ROUTINE X 2)
CULTURE: NO GROWTH
CULTURE: NO GROWTH

## 2016-06-13 LAB — AEROBIC/ANAEROBIC CULTURE W GRAM STAIN (SURGICAL/DEEP WOUND)

## 2016-06-13 LAB — MAGNESIUM: Magnesium: 1.6 mg/dL — ABNORMAL LOW (ref 1.7–2.4)

## 2016-06-13 LAB — PHOSPHORUS: PHOSPHORUS: 3.1 mg/dL (ref 2.5–4.6)

## 2016-06-13 MED ORDER — INSULIN ASPART 100 UNIT/ML ~~LOC~~ SOLN
3.0000 [IU] | SUBCUTANEOUS | Status: AC
  Administered 2016-06-13 (×2): 3 [IU] via SUBCUTANEOUS
  Filled 2016-06-13 (×2): qty 3

## 2016-06-13 MED ORDER — METOPROLOL TARTRATE 5 MG/5ML IV SOLN
5.0000 mg | Freq: Three times a day (TID) | INTRAVENOUS | Status: DC
  Administered 2016-06-13 – 2016-06-16 (×9): 5 mg via INTRAVENOUS
  Filled 2016-06-13 (×9): qty 5

## 2016-06-13 MED ORDER — CLINDAMYCIN PHOSPHATE 900 MG/50ML IV SOLN
900.0000 mg | Freq: Three times a day (TID) | INTRAVENOUS | Status: DC
  Administered 2016-06-13 – 2016-06-16 (×8): 900 mg via INTRAVENOUS
  Filled 2016-06-13 (×9): qty 50

## 2016-06-13 MED ORDER — CLINDAMYCIN PHOSPHATE 900 MG/50ML IV SOLN
900.0000 mg | Freq: Three times a day (TID) | INTRAVENOUS | Status: DC
  Administered 2016-06-13: 900 mg via INTRAVENOUS
  Filled 2016-06-13 (×3): qty 50

## 2016-06-13 MED ORDER — DIAZEPAM 5 MG/ML IJ SOLN
2.5000 mg | Freq: Once | INTRAMUSCULAR | Status: AC
  Administered 2016-06-13: 2.5 mg via INTRAVENOUS
  Filled 2016-06-13: qty 2

## 2016-06-13 MED ORDER — TRACE MINERALS CR-CU-MN-SE-ZN 10-1000-500-60 MCG/ML IV SOLN
INTRAVENOUS | Status: AC
  Administered 2016-06-13: 18:00:00 via INTRAVENOUS
  Filled 2016-06-13: qty 1800

## 2016-06-13 MED ORDER — HYDRALAZINE HCL 50 MG PO TABS
50.0000 mg | ORAL_TABLET | Freq: Three times a day (TID) | ORAL | Status: DC
  Administered 2016-06-13 – 2016-06-14 (×3): 50 mg via ORAL
  Filled 2016-06-13 (×4): qty 1

## 2016-06-13 MED ORDER — PROMETHAZINE HCL 25 MG/ML IJ SOLN
25.0000 mg | Freq: Four times a day (QID) | INTRAMUSCULAR | Status: DC | PRN
  Administered 2016-06-13: 25 mg via INTRAVENOUS
  Filled 2016-06-13: qty 1

## 2016-06-13 MED ORDER — INSULIN GLARGINE 100 UNIT/ML ~~LOC~~ SOLN
15.0000 [IU] | Freq: Every day | SUBCUTANEOUS | Status: DC
  Administered 2016-06-13 – 2016-06-22 (×10): 15 [IU] via SUBCUTANEOUS
  Filled 2016-06-13 (×10): qty 0.15

## 2016-06-13 MED ORDER — MAGNESIUM SULFATE 2 GM/50ML IV SOLN
2.0000 g | Freq: Once | INTRAVENOUS | Status: AC
  Administered 2016-06-13: 2 g via INTRAVENOUS
  Filled 2016-06-13: qty 50

## 2016-06-13 NOTE — Consult Note (Signed)
[place] [facility]                                                                                                                                                                                            Patient Demographics   [name], is a 70 y.o. female, DOB - 1945/11/17, [id number]  Admit date - 06/08/2016   Admitting Physician Clayburn Pert, MD  Outpatient Primary MD for the patient is Singh,[name], MD   LOS - 5  Subjective  Continues to have severe nausea and dry heaving Blood sugars close to 300. X-ray showed ileus  Review of Systems:   CONSTITUTIONAL: No documented fever. Positive for fatigue and weakness. No weight gain, no weight loss.  EYES: No blurry or double vision.  ENT: No tinnitus. No postnasal drip. No redness of the oropharynx.  RESPIRATORY: No cough, Positive wheeze, no hemoptysis. No dyspnea.  CARDIOVASCULAR: No chest pain. No orthopnea. No palpitations. No syncope.  GASTROINTESTINAL: Nausea. No vomiting. Abdominal pain present. GENITOURINARY: No dysuria or hematuria.  ENDOCRINE: No polyuria or nocturia. No heat or cold intolerance.  HEMATOLOGY: No anemia. No bruising. No bleeding.  INTEGUMENTARY: No rashes. No lesions.  MUSCULOSKELETAL: No arthritis. No swelling. No gout.  NEUROLOGIC: No numbness, tingling, or ataxia. No seizure-type activity.  PSYCHIATRIC: No anxiety. No insomnia. No ADD.   Vitals:   Vitals:   06/12/16 1357 06/12/16 2021 06/13/16 0413 06/13/16 1050  BP: (!) 155/67 139/65 (!) 169/79 (!) 166/59  Pulse: 82 94 98 (!) 105  Resp:  19 17   Temp:  98.4 F (36.9 C) 98.4 F (36.9 C)   TempSrc:  Oral Oral   SpO2: 99% 100% 98% 100%  Weight:   104.4 kg (230 lb 3.2 oz)   Height:        Wt Readings from Last 3 Encounters:  06/13/16 104.4 kg (230 lb 3.2 oz)  02/17/16 99.3 kg (219 lb)  11/05/15 97.7 kg (215 lb 5 oz)     Intake/Output Summary (Last 24 hours) at 06/13/16 1140 Last data  filed at 06/13/16 0713  Gross per 24 hour  Intake          2550.78 ml  Output             3400 ml  Net          -849.22 ml    Physical Exam:   GENERAL: Pleasant-appearing in no apparent distress.  HEAD, EYES, EARS, NOSE AND THROAT: Atraumatic, normocephalic. Extraocular muscles are intact. Pupils equal and reactive to light. Sclerae anicteric. No conjunctival injection. No oro-pharyngeal erythema.  NECK:  Supple. There is no jugular venous distention. No bruits, no lymphadenopathy, no thyromegaly.  HEART: Regular rate and rhythm,. No murmurs, no rubs, no clicks.  LUNGS: Clear to auscultation bilaterally. No rales or rhonchi. No wheezes.  ABDOMEN: Postop. Dressing. Some ostomy output.  Bowel sounds decreased  EXTREMITIES: No evidence of any cyanosis, clubbing, or peripheral edema.  +2 pedal and radial pulses bilaterally.  NEUROLOGIC: The patient is alert, awake, and oriented x3 with no focal motor or sensory deficits appreciated bilaterally.  SKIN: Moist and warm with no rashes appreciated.  Psych: Not anxious, depressed LN: No inguinal LN enlargement    Antibiotics   Anti-infectives    Start     Dose/Rate Route Frequency Ordered Stop   06/13/16 0930  clindamycin (CLEOCIN) IVPB 900 mg     900 mg 100 mL/hr over 30 Minutes Intravenous Every 8 hours 06/13/16 0929     06/10/16 1015  imipenem-cilastatin (PRIMAXIN) 500 mg in sodium chloride 0.9 % 100 mL IVPB  Status:  Discontinued     500 mg 200 mL/hr over 30 Minutes Intravenous Every 8 hours 06/10/16 1011 06/13/16 0927   06/09/16 0930  imipenem-cilastatin (PRIMAXIN) 500 mg in sodium chloride 0.9 % 100 mL IVPB  Status:  Discontinued     500 mg 200 mL/hr over 30 Minutes Intravenous Every 12 hours 06/09/16 0741 06/10/16 1011   06/08/16 0600  imipenem-cilastatin (PRIMAXIN) 300 mg in sodium chloride 0.9 % 100 mL IVPB  Status:  Discontinued     300 mg 200 mL/hr over 30 Minutes Intravenous Every 6 hours 06/08/16 0519 06/09/16 0741       Medications   Scheduled Meds: . albuterol  2.5 mg Nebulization Once  . amLODipine  10 mg Oral Daily  . clindamycin (CLEOCIN) IV  900 mg Intravenous Q8H  . enoxaparin (LOVENOX) injection  40 mg Subcutaneous Q24H  . famotidine (PEPCID) IV  20 mg Intravenous Q24H  . furosemide  40 mg Oral Daily  . hydrALAZINE  50 mg Oral Q8H  . insulin aspart  0-20 Units Subcutaneous Q4H  . insulin aspart  3 Units Subcutaneous Q4H  . insulin glargine  15 Units Subcutaneous Daily  . lisinopril  20 mg Oral Daily  . magnesium sulfate 1 - 4 g bolus IVPB  2 g Intravenous Once  . metoprolol  5 mg Intravenous Q8H   Continuous Infusions: . Marland KitchenTPN (CLINIMIX-E) Adult 75 mL/hr at 06/12/16 1737  . Marland KitchenTPN (CLINIMIX-E) Adult     PRN Meds:.acetaminophen, hydrALAZINE, ipratropium-albuterol, morphine injection, ondansetron **OR** ondansetron (ZOFRAN) IV, promethazine   Data Review:   Micro Results Recent Results (from the past 240 hour(s))  CULTURE, BLOOD (ROUTINE X 2) w Reflex to ID Panel     Status: None (Preliminary result)   Collection Time: 06/08/16  6:46 PM  Result Value Ref Range Status   Specimen Description BLOOD LEFT HAND  Final   Special Requests BOTTLES DRAWN AEROBIC AND ANAEROBIC 4ML  Final   Culture NO GROWTH 4 DAYS  Final   Report Status PENDING  Incomplete  CULTURE, BLOOD (ROUTINE X 2) w Reflex to ID Panel     Status: None (Preliminary result)   Collection Time: 06/08/16  7:03 PM  Result Value Ref Range Status   Specimen Description BLOOD LEFT HAND  Final   Special Requests BOTTLES DRAWN AEROBIC AND ANAEROBIC 5ML  Final   Culture NO GROWTH 4 DAYS  Final   Report Status PENDING  Incomplete  Aerobic/Anaerobic Culture (surgical/deep wound)  Status: None (Preliminary result)   Collection Time: 06/09/16 10:02 AM  Result Value Ref Range Status   Specimen Description ABDOMEN  Final   Special Requests NONE  Final   Gram Stain   Final    MODERATE WBC PRESENT,BOTH PMN AND MONONUCLEAR FEW GRAM  NEGATIVE RODS RARE GRAM POSITIVE COCCI IN PAIRS RARE SQUAMOUS EPITHELIAL CELLS PRESENT    Culture   Final    FEW ESCHERICHIA COLI FEW BACTEROIDES FRAGILIS FEW BACTEROIDES THETAIOTAOMICRON BETA LACTAMASE POSITIVE Performed at [facility]    Report Status PENDING  Incomplete   Organism ID, Bacteria ESCHERICHIA COLI  Final      Susceptibility   Escherichia coli - MIC*    AMPICILLIN 4 SENSITIVE Sensitive     CEFAZOLIN <=4 SENSITIVE Sensitive     CEFEPIME <=1 SENSITIVE Sensitive     CEFTAZIDIME <=1 SENSITIVE Sensitive     CEFTRIAXONE <=1 SENSITIVE Sensitive     CIPROFLOXACIN <=0.25 SENSITIVE Sensitive     GENTAMICIN <=1 SENSITIVE Sensitive     IMIPENEM <=0.25 SENSITIVE Sensitive     TRIMETH/SULFA <=20 SENSITIVE Sensitive     AMPICILLIN/SULBACTAM 4 SENSITIVE Sensitive     PIP/TAZO <=4 SENSITIVE Sensitive     Extended ESBL NEGATIVE Sensitive     * FEW ESCHERICHIA COLI    Radiology Reports Ct Abdomen Pelvis W Contrast  Result Date: 06/08/2016 CLINICAL DATA:  Urinary frequency for 2 weeks. Pelvic and suprapubic pain since yesterday. Low back pain. Nausea. EXAM: CT ABDOMEN AND PELVIS WITH CONTRAST TECHNIQUE: Multidetector CT imaging of the abdomen and pelvis was performed using the standard protocol following bolus administration of intravenous contrast. CONTRAST:  78mL ISOVUE-300 IOPAMIDOL (ISOVUE-300) INJECTION 61% COMPARISON:  None. FINDINGS: Dependent atelectasis in the lung bases. There is pneumoperitoneum with free intra-abdominal air throughout the abdomen and pelvis. Mild infiltration in the mesentery. Small amount of free fluid in the pelvis. Diverticula in the sigmoid colon with inflammatory stranding around the proximal sigmoid colon. Small pericolonic abscess measuring 1.4 x 2.7 cm. Changes are consistent with perforated acute diverticulitis. No small or large bowel distention. Small esophageal hiatal hernia. The liver, spleen, gallbladder, pancreas, adrenal glands,  kidneys, abdominal aorta, inferior vena cava, and retroperitoneal lymph nodes are unremarkable. Pelvis: Bladder wall is not thickened. No pelvic mass or lymphadenopathy. Appendix is normal. No destructive bone lesions. IMPRESSION: Perforated acute diverticulitis with diffuse free intraperitoneal air. Small pericolonic abscess adjacent to the junction of the descending and sigmoid colon. These results were called by telephone at the time of interpretation on 06/08/2016 at 3:07 am to Dr. Charlesetta Ivory , who verbally acknowledged these results. Electronically Signed   By: Lucienne Capers M.D.   On: 06/08/2016 03:28   Dg Chest Port 1 View  Result Date: 06/11/2016 CLINICAL DATA:  Onset of wheezing today; history of CHF, coronary artery disease, former smoker EXAM: PORTABLE CHEST 1 VIEW COMPARISON:  PA and lateral chest x-ray of June 09, 2016 FINDINGS: The lungs are adequately inflated. There is no focal infiltrate. There is no pleural effusion or pneumothorax. The cardiac silhouette remains enlarged. The pulmonary vascularity is slightly more conspicuous and mild interstitial edema on the right is suspected. The right internal jugular venous catheter tip projects over the midportion of the SVC. The bony thorax exhibits no acute abnormality. IMPRESSION: Low-grade CHF with mild interstitial edema more conspicuous than on yesterday's study. No alveolar pneumonia. Electronically Signed   By: David  Martinique M.D.   On: 06/11/2016 08:45   Dg Chest [facility]  1 View  Result Date: 06/09/2016 CLINICAL DATA:  Central line placement EXAM: PORTABLE CHEST 1 VIEW COMPARISON:  11/05/2015 FINDINGS: Mild cardiomegaly. Mediastinal shadows are normal. Right internal jugular central line has its tip in the SVC just below the azygos level. No pneumothorax or hemothorax. Lungs clear. IMPRESSION: Central line tip in the SVC just below the azygos level, 3 cm above the right atrium. No complication. Electronically Signed   By: Nelson Chimes  M.D.   On: 06/09/2016 15:44   Dg Abd 2 Views  Result Date: 06/13/2016 CLINICAL DATA:  Status post repair of sigmoid diverticulitis. Nausea and vomiting. EXAM: ABDOMEN - 2 VIEW COMPARISON:  CT 06/08/2016 FINDINGS: Supine views of the abdomen and pelvis. No gross free intraperitoneal air. Left pelvic surgical clips with probable surgical drain within. Mid small bowel loops measure up to 4.0 cm. Contrast within normal caliber ascending colon. IMPRESSION: Mild small bowel dilatation within the mid abdomen, favoring adynamic ileus. Low-grade partial small bowel obstruction could look similar. Electronically Signed   By: Abigail Miyamoto M.D.   On: 06/13/2016 09:33     CBC  Recent Labs Lab 06/09/16 0510 06/10/16 0456 06/11/16 0503 06/12/16 0450 06/13/16 0418  WBC 20.1* 18.8* 13.5* 15.3* 15.5*  HGB 9.8* 8.8* 8.5* 8.8* 8.8*  HCT 30.3* 26.8* 25.9* 26.5* 25.8*  PLT 200 186 186 227 245  MCV 89.5 88.1 88.9 88.1 86.0  MCH 28.8 29.1 29.1 29.1 29.3  MCHC 32.2 33.0 32.8 33.1 34.1  RDW 15.4* 15.6* 15.3* 15.1* 15.1*    Chemistries   Recent Labs Lab 06/08/16 0158 06/09/16 0510 06/10/16 0456 06/11/16 0503 06/12/16 0450 06/13/16 0418  NA 138 137 137 135 136 136  K 3.9 3.7 4.6 4.4 4.3 3.7  CL 104 103 110 108 110 107  CO2 24 23 20* 20* 23 22  GLUCOSE 65 193* 152* 251* 239* 278*  BUN 34* 37* 37* 34* 33* 37*  CREATININE 1.82* 2.39* 1.97* 1.95* 1.63* 1.66*  CALCIUM 9.8 9.0 7.8* 8.6* 9.1 9.1  MG  --  1.5* 1.4* 1.8 1.9 1.6*  AST 23  --   --  20  --   --   ALT 11*  --   --  10*  --   --   ALKPHOS 59  --   --  67  --   --   BILITOT 0.8  --   --  0.7  --   --    ------------------------------------------------------------------------------------------------------------------ estimated creatinine clearance is 37.8 mL/min (by C-G formula based on SCr of 1.66 mg/dL). ------------------------------------------------------------------------------------------------------------------ No results for  input(s): HGBA1C in the last 72 hours. ------------------------------------------------------------------------------------------------------------------  Recent Labs  06/11/16 0503  TRIG 71   ------------------------------------------------------------------------------------------------------------------ No results for input(s): TSH, T4TOTAL, T3FREE, THYROIDAB in the last 72 hours.  Invalid input(s): FREET3 ------------------------------------------------------------------------------------------------------------------ No results for input(s): VITAMINB12, FOLATE, FERRITIN, TIBC, IRON, RETICCTPCT in the last 72 hours.  Coagulation profile No results for input(s): INR, PROTIME in the last 168 hours.  No results for input(s): DDIMER in the last 72 hours.  Cardiac Enzymes No results for input(s): CKMB, TROPONINI, MYOGLOBIN in the last 168 hours.  Invalid input(s): CK ------------------------------------------------------------------------------------------------------------------ Invalid input(s): [place]   Janaysha Garrett  is a 70 y.o. female with a known history of Hypertension, insulin-dependent diabetes mellitus, CK D, rheumatoid arthritis, hypothyroidism presents to the hospital secondary to worsening abdominal pain and noted to have perforated sigmoid diverticulitis.  # Essential hypertension - Start Scheduled IV Lopressor Patient  is having an NG tube placed and pills will be on hold -Continue to hold hydrochlorothiazide -IV hydralazine as needed. - Blood pressure also elevated due to pain  # Insulin-dependent diabetes mellitus Patient is getting insulin to TPN, Lantus and scheduled NovoLog. 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 Improved  # Rheumatoid arthritis-follows with rheumatology. On very low-dose prednisone for maintenance and also recent vasculitis  episode.   # Perforated sigmoid status post surgery Continue IV antibiotics. Changed to clindamycin NG tube being placed today due to ileus  # Acute kidney injury over CKD stage III Improved and now close to baseline     Code Status Orders        Start     Ordered   06/08/16 0631  Full code  Continuous     06/08/16 0630    Code Status History    Date Active Date Inactive Code Status Order ID Comments User Context   11/05/2015 10:26 AM 11/10/2015  5:22 PM Full Code [id number]  Saundra Shelling, MD ED   11/05/2015  7:14 AM 11/05/2015 10:26 AM Full Code [id number]  Saundra Shelling, MD ED     DVT Prophylaxis SCDs  Lab Results  Component Value Date   PLT 245 06/13/2016   Time Spent in minutes 35 minute   Hillary Bow R M.D on 06/13/2016 at 11:40 AM  Between 7am to 6pm - Pager - [phone]  After 6pm go to [email or web address] - password [email or web address] [facility]   Office  [phone]

## 2016-06-13 NOTE — Progress Notes (Signed)
Subjective:   She is miserable today. His persistently nauseated and vomiting small amounts of greenish fluid. She's also coughing up some foamy material. She does not have any significant pain. She's having regular output of bilious material in her ostomy. She relates the nausea to her Primaxin dosage. None of her symptoms improved by discontinuing her diet along it. Plain films today reveal ileus versus small bowel obstruction. She continues on TPN.  Vital signs in last 24 hours: Temp:  [98.3 F (36.8 C)-98.4 F (36.9 C)] 98.4 F (36.9 C) (08/20 0413) Pulse Rate:  [81-98] 98 (08/20 0413) Resp:  [16-19] 17 (08/20 0413) BP: (139-169)/(58-79) 169/79 (08/20 0413) SpO2:  [96 %-100 %] 98 % (08/20 0413) Weight:  [104.4 kg (230 lb 3.2 oz)] 104.4 kg (230 lb 3.2 oz) (08/20 0413) Last BM Date: 06/08/16  Intake/Output from previous day: 08/19 0701 - 08/20 0700 In: 2414.8 [I.V.:1846.1; IV Piggyback:568.7] Out: 3300 [Urine:2150; Stool:1150]  Exam:  Her abdomen is soft does have good bowel sounds. Her ostomy is dusky but viable and there is good ostomy output. She does not have any sign of any significant wound drainage. There is no evidence for infection.  Lab Results:  CBC  Recent Labs  06/12/16 0450 06/13/16 0418  WBC 15.3* 15.5*  HGB 8.8* 8.8*  HCT 26.5* 25.8*  PLT 227 245   CMP     Component Value Date/Time   NA 136 06/13/2016 0418   NA 145 05/22/2014 0712   K 3.7 06/13/2016 0418   K 3.9 05/22/2014 0712   CL 107 06/13/2016 0418   CL 110 (H) 05/22/2014 0712   CO2 22 06/13/2016 0418   CO2 29 05/22/2014 0712   GLUCOSE 278 (H) 06/13/2016 0418   GLUCOSE 61 (L) 05/22/2014 0712   BUN 37 (H) 06/13/2016 0418   BUN 28 (H) 05/22/2014 0712   CREATININE 1.66 (H) 06/13/2016 0418   CREATININE 1.75 (H) 05/22/2014 0712   CALCIUM 9.1 06/13/2016 0418   CALCIUM 7.9 (L) 05/22/2014 0712   PROT 6.2 (L) 06/11/2016 0503   PROT 6.6 05/20/2014 1246   ALBUMIN 2.8 (L) 06/11/2016 0503   ALBUMIN  3.0 (L) 05/20/2014 1246   AST 20 06/11/2016 0503   AST 14 (L) 05/20/2014 1246   ALT 10 (L) 06/11/2016 0503   ALT 27 05/20/2014 1246   ALKPHOS 67 06/11/2016 0503   ALKPHOS 54 05/20/2014 1246   BILITOT 0.7 06/11/2016 0503   BILITOT 0.6 05/20/2014 1246   GFRNONAA 30 (L) 06/13/2016 0418   GFRNONAA 29 (L) 05/22/2014 0712   GFRAA 35 (L) 06/13/2016 0418   GFRAA 34 (L) 05/22/2014 0712   PT/INR No results for input(s): LABPROT, INR in the last 72 hours.  Studies/Results: Dg Abd 2 Views  Result Date: 06/13/2016 CLINICAL DATA:  Status post repair of sigmoid diverticulitis. Nausea and vomiting. EXAM: ABDOMEN - 2 VIEW COMPARISON:  CT 06/08/2016 FINDINGS: Supine views of the abdomen and pelvis. No gross free intraperitoneal air. Left pelvic surgical clips with probable surgical drain within. Mid small bowel loops measure up to 4.0 cm. Contrast within normal caliber ascending colon. IMPRESSION: Mild small bowel dilatation within the mid abdomen, favoring adynamic ileus. Low-grade partial small bowel obstruction could look similar. Electronically Signed   By: Abigail Miyamoto M.D.   On: 06/13/2016 09:33    Assessment/Plan: Her plain films suggest ileus versus bowel obstruction. With the output from her ostomy I suspect she has an ileus. Because the family and the patient relate her nausea  to her Primaxin we will discontinue her Primaxin and try Cleocin which she does have a itching allergy . I discussed that with the family and they're in agreement. We will place a nasogastric tube to see if we can decompress her abdominal system to decrease her nausea. This plans been discussed with the family and they are in agreement.

## 2016-06-13 NOTE — Progress Notes (Signed)
PARENTERAL NUTRITION CONSULT NOTE - INITIAL  Pharmacy Consult for TPN Indication: colon resection, NPO  Allergies  Allergen Reactions  . Doxycycline Other (See Comments)    Reaction: unknown  . Levaquin [Levofloxacin] Other (See Comments)    Causes joint pain  . Clindamycin/Lincomycin Itching    Petechia  . Penicillins Rash and Other (See Comments)    Has patient had a PCN reaction causing immediate rash, facial/tongue/throat swelling, SOB or lightheadedness with hypotension: Yes Has patient had a PCN reaction causing severe rash involving mucus membranes or skin necrosis: No Has patient had a PCN reaction that required hospitalization No Has patient had a PCN reaction occurring within the last 10 years: No If all of the above answers are "NO", then may proceed with Cephalosporin use.  . Sulfa Antibiotics Swelling and Rash    Patient Measurements: Height: 5\' 5"  (165.1 cm) Weight: 230 lb 3.2 oz (104.4 kg) IBW/kg (Calculated) : 57   Vital Signs: Temp: 98.4 F (36.9 C) (08/20 0413) Temp Source: Oral (08/20 0413) BP: 166/59 (08/20 1050) Pulse Rate: 105 (08/20 1050) Intake/Output from previous day: 08/19 0701 - 08/20 0700 In: 2414.8 [I.V.:1846.1; IV Piggyback:568.7] Out: 3300 [Urine:2150; Stool:1150] Intake/Output from this shift: Total I/O In: 136 [I.V.:85; IV Piggyback:51] Out: 100 [Stool:100]  Labs:  Recent Labs  06/11/16 0503 06/12/16 0450 06/13/16 0418  WBC 13.5* 15.3* 15.5*  HGB 8.5* 8.8* 8.8*  HCT 25.9* 26.5* 25.8*  PLT 186 227 245     Recent Labs  06/11/16 0503 06/12/16 0450 06/13/16 0418  NA 135 136 136  K 4.4 4.3 3.7  CL 108 110 107  CO2 20* 23 22  GLUCOSE 251* 239* 278*  BUN 34* 33* 37*  CREATININE 1.95* 1.63* 1.66*  CALCIUM 8.6* 9.1 9.1  MG 1.8 1.9 1.6*  PHOS 2.1* 1.4* 3.1  PROT 6.2*  --   --   ALBUMIN 2.8*  --   --   AST 20  --   --   ALT 10*  --   --   ALKPHOS 67  --   --   BILITOT 0.7  --   --   TRIG 71  --   --    Estimated  Creatinine Clearance: 37.8 mL/min (by C-G formula based on SCr of 1.66 mg/dL).    Recent Labs  06/12/16 2349 06/13/16 0412 06/13/16 0755  GLUCAP 302* 286* 309*    Insulin Requirements in the past 24 hours:  92 units   Pt on Lantus at home  Current Nutrition:  Clinimix E 5/20 at 10ml/hr  Assessment: 70 yo female s/p colon resection surgery for diverticulitis on 8/16 started TPN 8/17 All electrolytes wnl except magnesium.   Plan:  Will give Magnesium 2 g IV x 1.   Will add 60 units of Insulin to TPN bag. Will continue order of Novolog 3 units q4 hours; however will discontinue upon starting the new bag which will have insulin added. Will cut down on Lantus from 25 units back to 15 units. Pharmacy to follow. Please call pharmacy if any questions arise.  Thank you for the consult.    Aymen Widrig D 06/13/2016,10:51 AM

## 2016-06-14 LAB — GLUCOSE, CAPILLARY
GLUCOSE-CAPILLARY: 124 mg/dL — AB (ref 65–99)
Glucose-Capillary: 359 mg/dL — ABNORMAL HIGH (ref 65–99)
Glucose-Capillary: 115 mg/dL — ABNORMAL HIGH (ref 65–99)
Glucose-Capillary: 127 mg/dL — ABNORMAL HIGH (ref 65–99)
Glucose-Capillary: 138 mg/dL — ABNORMAL HIGH (ref 65–99)
Glucose-Capillary: 129 mg/dL — ABNORMAL HIGH (ref 65–99)
Glucose-Capillary: 131 mg/dL — ABNORMAL HIGH (ref 65–99)
Glucose-Capillary: 155 mg/dL — ABNORMAL HIGH (ref 65–99)

## 2016-06-14 LAB — CBC
HEMATOCRIT: 24.1 % — AB (ref 35.0–47.0)
HEMOGLOBIN: 8.1 g/dL — AB (ref 12.0–16.0)
MCH: 29.3 pg (ref 26.0–34.0)
MCHC: 33.8 g/dL (ref 32.0–36.0)
MCV: 86.7 fL (ref 80.0–100.0)
PLATELETS: 282 10*3/uL (ref 150–440)
RBC: 2.78 MIL/uL — AB (ref 3.80–5.20)
RDW: 15.3 % — AB (ref 11.5–14.5)
WBC: 14.6 10*3/uL — AB (ref 3.6–11.0)

## 2016-06-14 LAB — BASIC METABOLIC PANEL
Anion gap: 6 (ref 5–15)
BUN: 47 mg/dL — ABNORMAL HIGH (ref 6–20)
CALCIUM: 8.7 mg/dL — AB (ref 8.9–10.3)
CO2: 23 mmol/L (ref 22–32)
CREATININE: 1.7 mg/dL — AB (ref 0.44–1.00)
Chloride: 109 mmol/L (ref 101–111)
GFR, EST AFRICAN AMERICAN: 34 mL/min — AB (ref >60)
GFR, EST NON AFRICAN AMERICAN: 29 mL/min — AB (ref >60)
Glucose, Bld: 102 mg/dL — ABNORMAL HIGH (ref 65–99)
Potassium: 3.5 mmol/L (ref 3.5–5.1)
SODIUM: 138 mmol/L (ref 135–145)

## 2016-06-14 LAB — MAGNESIUM: MAGNESIUM: 1.9 mg/dL (ref 1.7–2.4)

## 2016-06-14 LAB — SURGICAL PATHOLOGY

## 2016-06-14 MED ORDER — FAT EMULSION 20 % IV EMUL
500.0000 mL | INTRAVENOUS | Status: AC
  Administered 2016-06-14: 500 mL via INTRAVENOUS
  Filled 2016-06-14: qty 500

## 2016-06-14 MED ORDER — TRACE MINERALS CR-CU-MN-SE-ZN 10-1000-500-60 MCG/ML IV SOLN
INTRAVENOUS | Status: AC
  Administered 2016-06-14: 18:00:00 via INTRAVENOUS
  Filled 2016-06-14: qty 1800

## 2016-06-14 MED ORDER — INSULIN ASPART 100 UNIT/ML ~~LOC~~ SOLN
0.0000 [IU] | SUBCUTANEOUS | Status: DC
  Administered 2016-06-14: 3 [IU] via SUBCUTANEOUS
  Administered 2016-06-14 – 2016-06-16 (×7): 2 [IU] via SUBCUTANEOUS
  Administered 2016-06-16 (×2): 3 [IU] via SUBCUTANEOUS
  Administered 2016-06-16 – 2016-06-17 (×2): 2 [IU] via SUBCUTANEOUS
  Administered 2016-06-17: 3 [IU] via SUBCUTANEOUS
  Administered 2016-06-17: 8 [IU] via SUBCUTANEOUS
  Administered 2016-06-17: 3 [IU] via SUBCUTANEOUS
  Administered 2016-06-17: 2 [IU] via SUBCUTANEOUS
  Administered 2016-06-18: 3 [IU] via SUBCUTANEOUS
  Administered 2016-06-18: 5 [IU] via SUBCUTANEOUS
  Administered 2016-06-18: 2 [IU] via SUBCUTANEOUS
  Administered 2016-06-18: 5 [IU] via SUBCUTANEOUS
  Administered 2016-06-18: 3 [IU] via SUBCUTANEOUS
  Administered 2016-06-18: 5 [IU] via SUBCUTANEOUS
  Administered 2016-06-19: 2 [IU] via SUBCUTANEOUS
  Administered 2016-06-19: 3 [IU] via SUBCUTANEOUS
  Administered 2016-06-19: 8 [IU] via SUBCUTANEOUS
  Administered 2016-06-19: 3 [IU] via SUBCUTANEOUS
  Administered 2016-06-20 (×2): 2 [IU] via SUBCUTANEOUS
  Administered 2016-06-20: 1 [IU] via SUBCUTANEOUS
  Administered 2016-06-20 – 2016-06-22 (×5): 3 [IU] via SUBCUTANEOUS
  Administered 2016-06-22: 5 [IU] via SUBCUTANEOUS
  Filled 2016-06-14 (×2): qty 3
  Filled 2016-06-14: qty 8
  Filled 2016-06-14 (×2): qty 2
  Filled 2016-06-14: qty 5
  Filled 2016-06-14 (×2): qty 3
  Filled 2016-06-14: qty 5
  Filled 2016-06-14: qty 3
  Filled 2016-06-14: qty 5
  Filled 2016-06-14: qty 2
  Filled 2016-06-14 (×3): qty 3
  Filled 2016-06-14: qty 5
  Filled 2016-06-14 (×3): qty 3
  Filled 2016-06-14 (×2): qty 2
  Filled 2016-06-14: qty 1
  Filled 2016-06-14: qty 2
  Filled 2016-06-14 (×2): qty 3
  Filled 2016-06-14 (×4): qty 2
  Filled 2016-06-14: qty 3
  Filled 2016-06-14 (×4): qty 2
  Filled 2016-06-14: qty 8

## 2016-06-14 NOTE — Progress Notes (Signed)
Nutrition Follow-up  DOCUMENTATION CODES:   Not applicable  INTERVENTION:  -Continue TPN 5%AA/20% dextrose at 70ml/hr. Recommend adding 20% lipids today (39ml to run for 10 hours) and 8/25 pending pt tolerance. Pharmacy following as well.   NUTRITION DIAGNOSIS:   Inadequate oral intake related to altered GI function as evidenced by NPO status.    GOAL:   Patient will meet greater than or equal to 90% of their needs    MONITOR:   Diet advancement (TPN)  REASON FOR ASSESSMENT:   Consult New TPN/TNA  ASSESSMENT:    Noted nausea, vomiting, possible ileus per MD note. Ng tube placed for decompression  TPN of 5% AA/20% dextrose continues at 29ml/hr via right IJ.  Medications reviewed:  Lasix, aspart, lantus, (60 units insulin being added to TPN as well) Labs reviewed: BUN 47, creatinine 1.70, glucose 102, Mg 1.9, FSBS 138, 127  NG tube output: 68ml noted Ileostomy output noted: 1136ml last 24 hr  UOP: 2340ml last 24 hr  Diet Order:  .TPN (CLINIMIX-E) Adult Diet NPO time specified Except for: Sips with Meds  Skin:  Reviewed, no issues  Last BM:  8/21 noted 442ml output via ileostomy  Height:   Ht Readings from Last 1 Encounters:  06/08/16 5\' 5"  (1.651 m)    Weight:   Wt Readings from Last 1 Encounters:  06/14/16 226 lb 11.2 oz (102.8 kg)    Ideal Body Weight:     BMI:  Body mass index is 37.72 kg/m.  Estimated Nutritional Needs:   Kcal:  1500-1900 kcals/d  Protein:  75-95 g/d  Fluid:  >/= 1557ml/d  EDUCATION NEEDS:   No education needs identified at this time  Hanzel Pizzo B. Zenia Resides, [place], [place] (pager) Weekend/On-Call pager [phone])

## 2016-06-14 NOTE — Consult Note (Signed)
[place] at [facility]                                                                                                                                                                                            Patient Demographics   [name], is a 70 y.o. female, DOB - 09/21/1946, [id number]  Admit date - 06/08/2016   Admitting Physician Clayburn Pert, MD  Outpatient Primary MD for the patient is [name],[name], MD   LOS - 6  Subjective   Feels better with NGT in place. Some abd pain No SOB  Review of Systems:   CONSTITUTIONAL: No documented fever. Positive for fatigue and weakness. No weight gain, no weight loss.  EYES: No blurry or double vision.  ENT: No tinnitus. No postnasal drip. No redness of the oropharynx.  RESPIRATORY: No cough, Positive wheeze, no hemoptysis. No dyspnea.  CARDIOVASCULAR: No chest pain. No orthopnea. No palpitations. No syncope.  GASTROINTESTINAL: Nausea. No vomiting. Abdominal pain present. GENITOURINARY: No dysuria or hematuria.  ENDOCRINE: No polyuria or nocturia. No heat or cold intolerance.  HEMATOLOGY: No anemia. No bruising. No bleeding.  INTEGUMENTARY: No rashes. No lesions.  MUSCULOSKELETAL: No arthritis. No swelling. No gout.  NEUROLOGIC: No numbness, tingling, or ataxia. No seizure-type activity.  PSYCHIATRIC: No anxiety. No insomnia. No ADD.   Vitals:   Vitals:   06/13/16 2028 06/14/16 0414 06/14/16 0445 06/14/16 0652  BP: (!) 151/63 (!) 142/50  (!) 139/54  Pulse: 95 98  93  Resp: 18 18    Temp: 98.9 F (37.2 C) 98.8 F (37.1 C)    TempSrc: Oral Oral    SpO2: 100% 98%  98%  Weight:   102.8 kg (226 lb 11.2 oz)   Height:        Wt Readings from Last 3 Encounters:  06/14/16 102.8 kg (226 lb 11.2 oz)  02/17/16 99.3 kg (219 lb)  11/05/15 97.7 kg (215 lb 5 oz)     Intake/Output Summary (Last 24 hours) at 06/14/16 1121 Last data filed at 06/14/16 0805  Gross per 24 hour  Intake            1971.2 ml  Output             2725 ml  Net           -753.8 ml    Physical Exam:   GENERAL: Pleasant-appearing in no apparent distress.  HEAD, EYES, EARS, NOSE AND THROAT: Atraumatic, normocephalic. Extraocular muscles are intact. Pupils equal and reactive to light. Sclerae anicteric. No conjunctival injection. No oro-pharyngeal erythema.  NECK: Supple. There is  no jugular venous distention. No bruits, no lymphadenopathy, no thyromegaly.  HEART: Regular rate and rhythm,. No murmurs, no rubs, no clicks.  LUNGS: Clear to auscultation bilaterally. No rales or rhonchi. No wheezes.  ABDOMEN: Postop. Dressing. Some ostomy output.  Bowel sounds decreased  EXTREMITIES: No evidence of any cyanosis, clubbing, or peripheral edema.  +2 pedal and radial pulses bilaterally.  NEUROLOGIC: The patient is alert, awake, and oriented x3 with no focal motor or sensory deficits appreciated bilaterally.  SKIN: Moist and warm with no rashes appreciated.  Psych: Not anxious, depressed LN: No inguinal LN enlargement    Antibiotics   Anti-infectives    Start     Dose/Rate Route Frequency Ordered Stop   06/13/16 1930  clindamycin (CLEOCIN) IVPB 900 mg     900 mg 100 mL/hr over 30 Minutes Intravenous Every 8 hours 06/13/16 1438     06/13/16 0930  clindamycin (CLEOCIN) IVPB 900 mg  Status:  Discontinued     900 mg 100 mL/hr over 30 Minutes Intravenous Every 8 hours 06/13/16 0929 06/13/16 1438   06/10/16 1015  imipenem-cilastatin (PRIMAXIN) 500 mg in sodium chloride 0.9 % 100 mL IVPB  Status:  Discontinued     500 mg 200 mL/hr over 30 Minutes Intravenous Every 8 hours 06/10/16 1011 06/13/16 0927   06/09/16 0930  imipenem-cilastatin (PRIMAXIN) 500 mg in sodium chloride 0.9 % 100 mL IVPB  Status:  Discontinued     500 mg 200 mL/hr over 30 Minutes Intravenous Every 12 hours 06/09/16 0741 06/10/16 1011   06/08/16 0600  imipenem-cilastatin (PRIMAXIN) 300 mg in sodium chloride 0.9 % 100 mL IVPB  Status:   Discontinued     300 mg 200 mL/hr over 30 Minutes Intravenous Every 6 hours 06/08/16 0519 06/09/16 0741      Medications   Scheduled Meds: . amLODipine  10 mg Oral Daily  . clindamycin (CLEOCIN) IV  900 mg Intravenous Q8H  . enoxaparin (LOVENOX) injection  40 mg Subcutaneous Q24H  . famotidine (PEPCID) IV  20 mg Intravenous Q24H  . furosemide  40 mg Oral Daily  . hydrALAZINE  50 mg Oral Q8H  . insulin aspart  0-20 Units Subcutaneous Q4H  . insulin glargine  15 Units Subcutaneous Daily  . lisinopril  20 mg Oral Daily  . metoprolol  5 mg Intravenous Q8H   Continuous Infusions: . Marland KitchenTPN (CLINIMIX-E) Adult 75 mL/hr at 06/14/16 0753   PRN Meds:.acetaminophen, hydrALAZINE, ipratropium-albuterol, morphine injection, ondansetron **OR** ondansetron (ZOFRAN) IV, promethazine   Data Review:   Micro Results Recent Results (from the past 240 hour(s))  CULTURE, BLOOD (ROUTINE X 2) w Reflex to ID Panel     Status: None   Collection Time: 06/08/16  6:46 PM  Result Value Ref Range Status   Specimen Description BLOOD LEFT HAND  Final   Special Requests BOTTLES DRAWN AEROBIC AND ANAEROBIC 4ML  Final   Culture NO GROWTH 5 DAYS  Final   Report Status 06/13/2016 FINAL  Final  CULTURE, BLOOD (ROUTINE X 2) w Reflex to ID Panel     Status: None   Collection Time: 06/08/16  7:03 PM  Result Value Ref Range Status   Specimen Description BLOOD LEFT HAND  Final   Special Requests BOTTLES DRAWN AEROBIC AND ANAEROBIC 5ML  Final   Culture NO GROWTH 5 DAYS  Final   Report Status 06/13/2016 FINAL  Final  Aerobic/Anaerobic Culture (surgical/deep wound)     Status: None   Collection Time: 06/09/16 10:02 AM  Result Value Ref Range Status   Specimen Description ABDOMEN  Final   Special Requests NONE  Final   Gram Stain   Final    MODERATE WBC PRESENT,BOTH PMN AND MONONUCLEAR FEW GRAM NEGATIVE RODS RARE GRAM POSITIVE COCCI IN PAIRS RARE SQUAMOUS EPITHELIAL CELLS PRESENT    Culture   Final    FEW  ESCHERICHIA COLI FEW BACTEROIDES FRAGILIS FEW BACTEROIDES THETAIOTAOMICRON BETA LACTAMASE POSITIVE Performed at [facility]    Report Status 06/13/2016 FINAL  Final   Organism ID, Bacteria ESCHERICHIA COLI  Final      Susceptibility   Escherichia coli - MIC*    AMPICILLIN 4 SENSITIVE Sensitive     CEFAZOLIN <=4 SENSITIVE Sensitive     CEFEPIME <=1 SENSITIVE Sensitive     CEFTAZIDIME <=1 SENSITIVE Sensitive     CEFTRIAXONE <=1 SENSITIVE Sensitive     CIPROFLOXACIN <=0.25 SENSITIVE Sensitive     GENTAMICIN <=1 SENSITIVE Sensitive     IMIPENEM <=0.25 SENSITIVE Sensitive     TRIMETH/SULFA <=20 SENSITIVE Sensitive     AMPICILLIN/SULBACTAM 4 SENSITIVE Sensitive     PIP/TAZO <=4 SENSITIVE Sensitive     Extended ESBL NEGATIVE Sensitive     * FEW ESCHERICHIA COLI    Radiology Reports Ct Abdomen Pelvis W Contrast  Result Date: 06/08/2016 CLINICAL DATA:  Urinary frequency for 2 weeks. Pelvic and suprapubic pain since yesterday. Low back pain. Nausea. EXAM: CT ABDOMEN AND PELVIS WITH CONTRAST TECHNIQUE: Multidetector CT imaging of the abdomen and pelvis was performed using the standard protocol following bolus administration of intravenous contrast. CONTRAST:  78mL ISOVUE-300 IOPAMIDOL (ISOVUE-300) INJECTION 61% COMPARISON:  None. FINDINGS: Dependent atelectasis in the lung bases. There is pneumoperitoneum with free intra-abdominal air throughout the abdomen and pelvis. Mild infiltration in the mesentery. Small amount of free fluid in the pelvis. Diverticula in the sigmoid colon with inflammatory stranding around the proximal sigmoid colon. Small pericolonic abscess measuring 1.4 x 2.7 cm. Changes are consistent with perforated acute diverticulitis. No small or large bowel distention. Small esophageal hiatal hernia. The liver, spleen, gallbladder, pancreas, adrenal glands, kidneys, abdominal aorta, inferior vena cava, and retroperitoneal lymph nodes are unremarkable. Pelvis: Bladder wall is  not thickened. No pelvic mass or lymphadenopathy. Appendix is normal. No destructive bone lesions. IMPRESSION: Perforated acute diverticulitis with diffuse free intraperitoneal air. Small pericolonic abscess adjacent to the junction of the descending and sigmoid colon. These results were called by telephone at the time of interpretation on 06/08/2016 at 3:07 am to Dr. Charlesetta Ivory , who verbally acknowledged these results. Electronically Signed   By: Lucienne Capers M.D.   On: 06/08/2016 03:28   Dg Chest Port 1 View  Result Date: 06/11/2016 CLINICAL DATA:  Onset of wheezing today; history of CHF, coronary artery disease, former smoker EXAM: PORTABLE CHEST 1 VIEW COMPARISON:  PA and lateral chest x-ray of June 09, 2016 FINDINGS: The lungs are adequately inflated. There is no focal infiltrate. There is no pleural effusion or pneumothorax. The cardiac silhouette remains enlarged. The pulmonary vascularity is slightly more conspicuous and mild interstitial edema on the right is suspected. The right internal jugular venous catheter tip projects over the midportion of the SVC. The bony thorax exhibits no acute abnormality. IMPRESSION: Low-grade CHF with mild interstitial edema more conspicuous than on yesterday's study. No alveolar pneumonia. Electronically Signed   By: David  Martinique M.D.   On: 06/11/2016 08:45   Dg Chest Port 1 View  Result Date: 06/09/2016 CLINICAL DATA:  Central line  placement EXAM: PORTABLE CHEST 1 VIEW COMPARISON:  11/05/2015 FINDINGS: Mild cardiomegaly. Mediastinal shadows are normal. Right internal jugular central line has its tip in the SVC just below the azygos level. No pneumothorax or hemothorax. Lungs clear. IMPRESSION: Central line tip in the SVC just below the azygos level, 3 cm above the right atrium. No complication. Electronically Signed   By: Nelson Chimes M.D.   On: 06/09/2016 15:44   Dg Abd 2 Views  Result Date: 06/13/2016 CLINICAL DATA:  Status post repair of sigmoid  diverticulitis. Nausea and vomiting. EXAM: ABDOMEN - 2 VIEW COMPARISON:  CT 06/08/2016 FINDINGS: Supine views of the abdomen and pelvis. No gross free intraperitoneal air. Left pelvic surgical clips with probable surgical drain within. Mid small bowel loops measure up to 4.0 cm. Contrast within normal caliber ascending colon. IMPRESSION: Mild small bowel dilatation within the mid abdomen, favoring adynamic ileus. Low-grade partial small bowel obstruction could look similar. Electronically Signed   By: Abigail Miyamoto M.D.   On: 06/13/2016 09:33   Dg Abd Portable 1v  Result Date: 06/13/2016 CLINICAL DATA:  Readjustment of NG tube EXAM: PORTABLE ABDOMEN - 1 VIEW COMPARISON:  06/13/2016 FINDINGS: Distended small bowel loops are noted mid abdomen suspicious for small bowel obstruction. NG tube with tip in distal stomach. IMPRESSION: Distended small bowel loops mid abdomen again noted. NG tube with tip in distal stomach. Electronically Signed   By: Lahoma Crocker M.D.   On: 06/13/2016 12:07     CBC  Recent Labs Lab 06/10/16 0456 06/11/16 0503 06/12/16 0450 06/13/16 0418 06/14/16 0452  WBC 18.8* 13.5* 15.3* 15.5* 14.6*  HGB 8.8* 8.5* 8.8* 8.8* 8.1*  HCT 26.8* 25.9* 26.5* 25.8* 24.1*  PLT 186 186 227 245 282  MCV 88.1 88.9 88.1 86.0 86.7  MCH 29.1 29.1 29.1 29.3 29.3  MCHC 33.0 32.8 33.1 34.1 33.8  RDW 15.6* 15.3* 15.1* 15.1* 15.3*    Chemistries   Recent Labs Lab 06/08/16 0158  06/10/16 0456 06/11/16 0503 06/12/16 0450 06/13/16 0418 06/14/16 0452  NA 138  < > 137 135 136 136 138  K 3.9  < > 4.6 4.4 4.3 3.7 3.5  CL 104  < > 110 108 110 107 109  CO2 24  < > 20* 20* 23 22 23   GLUCOSE 65  < > 152* 251* 239* 278* 102*  BUN 34*  < > 37* 34* 33* 37* 47*  CREATININE 1.82*  < > 1.97* 1.95* 1.63* 1.66* 1.70*  CALCIUM 9.8  < > 7.8* 8.6* 9.1 9.1 8.7*  MG  --   < > 1.4* 1.8 1.9 1.6* 1.9  AST 23  --   --  20  --   --   --   ALT 11*  --   --  10*  --   --   --   ALKPHOS 59  --   --  67  --   --    --   BILITOT 0.8  --   --  0.7  --   --   --   < > = values in this interval not displayed. ------------------------------------------------------------------------------------------------------------------ estimated creatinine clearance is 36.6 mL/min (by C-G formula based on SCr of 1.7 mg/dL). ------------------------------------------------------------------------------------------------------------------ No results for input(s): HGBA1C in the last 72 hours. ------------------------------------------------------------------------------------------------------------------ No results for input(s): CHOL, HDL, LDLCALC, TRIG, CHOLHDL, LDLDIRECT in the last 72 hours. ------------------------------------------------------------------------------------------------------------------ No results for input(s): TSH, T4TOTAL, T3FREE, THYROIDAB in the last 72 hours.  Invalid input(s): FREET3 ------------------------------------------------------------------------------------------------------------------ No  results for input(s): VITAMINB12, FOLATE, FERRITIN, TIBC, IRON, RETICCTPCT in the last 72 hours.  Coagulation profile No results for input(s): INR, PROTIME in the last 168 hours.  No results for input(s): DDIMER in the last 72 hours.  Cardiac Enzymes No results for input(s): CKMB, TROPONINI, MYOGLOBIN in the last 168 hours.  Invalid input(s): CK ------------------------------------------------------------------------------------------------------------------ Invalid input(s): [place]   Signora Hiles  is a 70 y.o. female with a known history of Hypertension, insulin-dependent diabetes mellitus, CK D, rheumatoid arthritis, hypothyroidism presents to the hospital secondary to worsening abdominal pain and noted to have perforated sigmoid diverticulitis.  # Essential hypertension - Started Scheduled IV Lopressor - Continue to hold hydrochlorothiazide - IV hydralazine as  needed. - Blood pressure also elevated due to pain  # Insulin-dependent diabetes mellitus Patient is getting insulin to TPN, Lantus and scheduled NovoLog. 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 Improved  # Rheumatoid arthritis-follows with rheumatology.  On very low-dose prednisone for maintenance and also recent vasculitis episode.   # Perforated sigmoid status post surgery ON clindamycin NG tube being placed today due to ileus  # Acute kidney injury over CKD stage III Improved and now close to baseline     Code Status Orders        Start     Ordered   06/08/16 0631  Full code  Continuous     06/08/16 0630    Code Status History    Date Active Date Inactive Code Status Order ID Comments User Context   11/05/2015 10:26 AM 11/10/2015  5:22 PM Full Code [id number]  Saundra Shelling, MD ED   11/05/2015  7:14 AM 11/05/2015 10:26 AM Full Code [id number]  Saundra Shelling, MD ED     DVT Prophylaxis SCDs  Lab Results  Component Value Date   PLT 282 06/14/2016   Time Spent in minutes 25 minute   Hillary Bow R M.D on 06/14/2016 at 11:21 AM  Between 7am to 6pm - Pager - [phone]  After 6pm go to [email or web address] - password [email or web address] [facility]   Office  [phone]

## 2016-06-14 NOTE — Care Management Important Message (Signed)
Important Message  Patient Details  Name: [name] MRN: [id number] Date of Birth: Dec 15, 1945   Medicare Important Message Given:  Yes    Katrina Stack, RN 06/14/2016, 11:21 AM

## 2016-06-14 NOTE — Consult Note (Signed)
[place] Nurse ostomy follow up Stoma type/location:  LLQ ileostomy Stomal assessment/size: 1 1/2 " pink, moist, mildly budded.  Some duskiness to 6 o'clock but yellow, slough over area is loosely connected. Large amount of green liquid stool in pouch.  Stoma producing well. Peristomal assessment: Intact.  Mildly budded stoma Treatment options for stomal/peristomal skin:  2 piece Hollister clear pouch [id number]) with cut to fit 1 1/2" flat barrier (14204) , barrier ring (7805) used to cut edge.   Output : Large amount green,clear liquid stool Ostomy pouching: 2pc.  Education provided: Discussed how to change pouch at home and supplies that will not be covered by insurance i.e. Gauze, gloves.  Explained rational behind measuring stoma with each pouch change to accommodate decreased swelling to stoma.  Template left with next pouch change.  Demonstrated emptying and cleaning flange with pt.  Discussed frequency of emptying pouch during day and frequency of pouch changes. Pt currently on TPN but will need eduction on hydration, use of Imodium to slow output once ileus resolved and pt on diet. Pt able to hold gauze over stoma during pouch change. Enrolled patient in [place] Start Discharge program: Not at this time. Will follow up with pt on Wednesday for next pouch change.  Pt interested in getting up to see pouch change in mirror.  Pt states her sister will be present at next change.  Discussed POC with patient and bedside nurse.  Pt will be seen on Wednesday by wound team for pouch change. Thanks Dorna Bloom [name], RN, [facility]

## 2016-06-14 NOTE — Progress Notes (Signed)
PARENTERAL NUTRITION CONSULT NOTE - INITIAL  Pharmacy Consult for TPN Indication: colon resection, NPO  Allergies  Allergen Reactions  . Doxycycline Other (See Comments)    Reaction: unknown  . Levaquin [Levofloxacin] Other (See Comments)    Causes joint pain  . Penicillins Rash and Other (See Comments)    Has patient had a PCN reaction causing immediate rash, facial/tongue/throat swelling, SOB or lightheadedness with hypotension: Yes Has patient had a PCN reaction causing severe rash involving mucus membranes or skin necrosis: No Has patient had a PCN reaction that required hospitalization No Has patient had a PCN reaction occurring within the last 10 years: No If all of the above answers are "NO", then may proceed with Cephalosporin use.  . Sulfa Antibiotics Swelling and Rash    Patient Measurements: Height: 5\' 5"  (165.1 cm) Weight: 226 lb 11.2 oz (102.8 kg) IBW/kg (Calculated) : 57   Vital Signs: Temp: 98.8 F (37.1 C) (08/21 0414) Temp Source: Oral (08/21 0414) BP: 139/54 (08/21 [id number]) Pulse Rate: 93 (08/21 0652) Intake/Output from previous day: 08/20 0701 - 08/21 0700 In: 1571.2 [I.V.:1352.2; NG/GT:30; IV Piggyback:189] Out: [id number] [Urine:1200; Emesis/NG output:50; [id number] Intake/Output from this shift: Total I/O In: 1502 [I.V.:1452; IV Piggyback:50] Out: 800 [Urine:550; Stool:250]  Labs:  Recent Labs  06/12/16 0450 06/13/16 0418 06/14/16 0452  WBC 15.3* 15.5* 14.6*  HGB 8.8* 8.8* 8.1*  HCT 26.5* 25.8* 24.1*  PLT 227 245 282     Recent Labs  06/12/16 0450 06/13/16 0418 06/14/16 0452  NA 136 136 138  K 4.3 3.7 3.5  CL 110 107 109  CO2 23 22 23   GLUCOSE 239* 278* 102*  BUN 33* 37* 47*  CREATININE 1.63* 1.66* 1.70*  CALCIUM 9.1 9.1 8.7*  MG 1.9 1.6* 1.9  PHOS 1.4* 3.1  --    Estimated Creatinine Clearance: 36.6 mL/min (by C-G formula based on SCr of 1.7 mg/dL).    Recent Labs  06/14/16 0517 06/14/16 0739 06/14/16 1210  GLUCAP 127* 138*  129*    Insulin Requirements in the past 24 hours:  81 units.    Pt on Lantus at home  Current Nutrition:  Clinimix E 5/20 at 75ml/hr  Assessment: 70 yo female s/p colon resection surgery for diverticulitis on 8/16 started TPN 8/17 All electrolytes are within normal limits.    Plan:   Will decrease insulin in TPN to 50 units/day. Will decrease SSI to moderate scale. Will continue Lantus 15 units daily.    Pharmacy will continue to monitor and adjust per consult.    Simpson,[name] L 06/14/2016,1:58 PM

## 2016-06-14 NOTE — Evaluation (Signed)
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 She has continued to have difficulty with nausea, x-rays show likely ileus, treated with NG tube.   Clinical Impression  Patient admitted for abdominal pain and had operation on perforated diverticulitis, has received NG tube to treat ileus due to complaints of continuing nausea. Patient has typically been independent in the home without AD, however today she fatigues quite quickly with transfers and limited bout of gait. She requires min A to complete bed mobility, however mod-max A x1 with RW to complete sit to stand secondary to LE weakness. Patient is able to take several short steps to transfer to recliner, though she becomes nauseated and fatigued in the process. Would recommend SNF at this time to increase her independence with mobility.     Follow Up Recommendations SNF    Equipment Recommendations  Rolling walker with 5" wheels    Recommendations for Other Services       Precautions / Restrictions Precautions Precautions: Fall Restrictions Weight Bearing Restrictions: No      Mobility  Bed Mobility Overal bed mobility: Needs Assistance Bed Mobility: Supine to Sit     Supine to sit: Min assist     General bed mobility comments: Patient requires assistance to bring LEs off the edge of the bed, min A to bring torso upright.   Transfers Overall transfer level: Needs assistance Equipment used: Rolling walker (2 wheeled) Transfers: Sit to/from Stand Sit to Stand: Mod assist;Max assist         General transfer comment: Patient requires 2 attempts and heavy assistance from PT to complete transfer secondary to LE weakness.   Ambulation/Gait Ambulation/Gait assistance: Min  guard Ambulation Distance (Feet): 3 Feet Assistive device: Rolling walker (2 wheeled) Gait Pattern/deviations: Decreased step length - right;Decreased step length - left;Narrow base of support;Shuffle;Trunk flexed   Gait velocity interpretation: <1.8 ft/sec, indicative of risk for recurrent falls General Gait Details: Patient becomes fatigued and nauseated with standing, agrees to ambulate to chair, flexed at trunk with short step lengths noted.   Stairs            Wheelchair Mobility    Modified Rankin (Stroke Patients Only)       Balance Overall balance assessment: Needs assistance Sitting-balance support: No upper extremity supported Sitting balance-Leahy Scale: Good     Standing balance support: Bilateral upper extremity supported Standing balance-Leahy Scale: Fair                               Pertinent Vitals/Pain Pain Assessment:  (Pt reports pain in lower abdominal area, more nauseated after ambulation)    Home Living Family/patient expects to be discharged to:: Private residence Living Arrangements: Children Available Help at Discharge: Family Type of Home: House Home Access: Level entry     Home Layout: One level Home Equipment: Environmental consultant - 2 wheels;Cane - single point      Prior Function Level of Independence: Independent with assistive device(s)         Comments: Patient reports she has not been ambulating with any devices recently, has used SPC and RW in the past.      Hand Dominance        Extremity/Trunk Assessment  Upper Extremity Assessment: Overall WFL for tasks assessed           Lower Extremity Assessment: Overall WFL for tasks assessed         Communication   Communication: No difficulties  Cognition Arousal/Alertness: Awake/alert Behavior During Therapy: WFL for tasks assessed/performed Overall Cognitive Status: Within Functional Limits for tasks assessed                      General Comments       Exercises        Assessment/Plan    PT Assessment Patient needs continued PT services  PT Diagnosis Difficulty walking;Generalized weakness   PT Problem List Decreased strength;Decreased mobility;Decreased activity tolerance;Decreased balance  PT Treatment Interventions DME instruction;Gait training;Stair training;Balance training;Therapeutic exercise;Therapeutic activities   PT Goals (Current goals can be found in the Care Plan section) Acute Rehab PT Goals Patient Stated Goal: To return home  PT Goal Formulation: With patient Time For Goal Achievement: 06/28/16 Potential to Achieve Goals: Good    Frequency Min 2X/week   Barriers to discharge        Co-evaluation               End of Session Equipment Utilized During Treatment: Gait belt Activity Tolerance: Patient tolerated treatment well;Patient limited by fatigue Patient left: in chair;with chair alarm set;with call bell/phone within reach Nurse Communication: Mobility status (No chair alarm in room)         Time: [id number] PT Time Calculation (min) (ACUTE ONLY): 21 min   Charges:   PT Evaluation $PT Eval High Complexity: 1 Procedure     PT G Codes:    Kerman Passey, PT, DPT       06/14/2016, 5:21 PM

## 2016-06-14 NOTE — Progress Notes (Signed)
POD # 5 for sigmoid colectomy and loops ileostomy for perf diverticulitis Ileus now resolving, minimal NGT output Passing flatus AVSS TPN  PE NAD Abd: soft, incision stapels in place w pensrose, thick serosus fluid, no overt woun infection, ostomy viable and w output No peritonitis  A/P  Resolving ileus ngt clamp trial Dc foley Continue Ab Mobilize Recheck labs in am

## 2016-06-14 NOTE — Care Management (Signed)
Patient had nasogastric tube placed on 8/19 and minimal output .  Informed there is minimal output from colostomy.  Has only been up to bedside commode.  Obtained order for physical therapy.  Seen by wound care  nurse today for ostomy teaching.  Patient has not actually participated with application of appliance.  She remains npo

## 2016-06-15 ENCOUNTER — Inpatient Hospital Stay: Payer: Commercial Managed Care - HMO

## 2016-06-15 LAB — CBC
HCT: 22.3 % — ABNORMAL LOW (ref 35.0–47.0)
Hemoglobin: 7.7 g/dL — ABNORMAL LOW (ref 12.0–16.0)
MCH: 29.8 pg (ref 26.0–34.0)
MCHC: 34.6 g/dL (ref 32.0–36.0)
MCV: 86 fL (ref 80.0–100.0)
Platelets: 284 10*3/uL (ref 150–440)
RBC: 2.59 MIL/uL — AB (ref 3.80–5.20)
RDW: 15.1 % — AB (ref 11.5–14.5)
WBC: 15.1 10*3/uL — AB (ref 3.6–11.0)

## 2016-06-15 LAB — GLUCOSE, CAPILLARY
GLUCOSE-CAPILLARY: 101 mg/dL — AB (ref 65–99)
GLUCOSE-CAPILLARY: 135 mg/dL — AB (ref 65–99)
Glucose-Capillary: 126 mg/dL — ABNORMAL HIGH (ref 65–99)
Glucose-Capillary: 132 mg/dL — ABNORMAL HIGH (ref 65–99)
Glucose-Capillary: 123 mg/dL — ABNORMAL HIGH (ref 65–99)
Glucose-Capillary: 120 mg/dL — ABNORMAL HIGH (ref 65–99)
Glucose-Capillary: 124 mg/dL — ABNORMAL HIGH (ref 65–99)

## 2016-06-15 LAB — BASIC METABOLIC PANEL
ANION GAP: 5 (ref 5–15)
BUN: 58 mg/dL — AB (ref 6–20)
CALCIUM: 8.3 mg/dL — AB (ref 8.9–10.3)
CO2: 23 mmol/L (ref 22–32)
CREATININE: 2.18 mg/dL — AB (ref 0.44–1.00)
Chloride: 108 mmol/L (ref 101–111)
GFR calc Af Amer: 25 mL/min — ABNORMAL LOW (ref >60)
GFR calc non Af Amer: 22 mL/min — ABNORMAL LOW (ref >60)
GLUCOSE: 120 mg/dL — AB (ref 65–99)
Potassium: 3.5 mmol/L (ref 3.5–5.1)
Sodium: 136 mmol/L (ref 135–145)

## 2016-06-15 LAB — MAGNESIUM: MAGNESIUM: 1.8 mg/dL (ref 1.7–2.4)

## 2016-06-15 LAB — PHOSPHORUS: PHOSPHORUS: 3.9 mg/dL (ref 2.5–4.6)

## 2016-06-15 MED ORDER — ALBUMIN HUMAN 25 % IV SOLN
12.5000 g | Freq: Once | INTRAVENOUS | Status: AC
  Administered 2016-06-15: 12.5 g via INTRAVENOUS
  Filled 2016-06-15: qty 50

## 2016-06-15 MED ORDER — PIPERACILLIN-TAZOBACTAM 3.375 G IVPB
3.3750 g | Freq: Three times a day (TID) | INTRAVENOUS | Status: DC
  Administered 2016-06-15 – 2016-06-22 (×20): 3.375 g via INTRAVENOUS
  Filled 2016-06-15 (×22): qty 50

## 2016-06-15 MED ORDER — LACTATED RINGERS IV SOLN
INTRAVENOUS | Status: DC
  Administered 2016-06-15 – 2016-06-22 (×10): via INTRAVENOUS

## 2016-06-15 MED ORDER — DIPHENHYDRAMINE HCL 50 MG/ML IJ SOLN
12.5000 mg | Freq: Three times a day (TID) | INTRAMUSCULAR | Status: DC | PRN
  Filled 2016-06-15: qty 1

## 2016-06-15 MED ORDER — ENOXAPARIN SODIUM 30 MG/0.3ML ~~LOC~~ SOLN
30.0000 mg | SUBCUTANEOUS | Status: DC
  Administered 2016-06-16 – 2016-06-17 (×2): 30 mg via SUBCUTANEOUS
  Filled 2016-06-15 (×2): qty 0.3

## 2016-06-15 MED ORDER — INSULIN REGULAR HUMAN 100 UNIT/ML IJ SOLN
INTRAMUSCULAR | Status: AC
  Administered 2016-06-15: 19:00:00 via INTRAVENOUS
  Filled 2016-06-15: qty 1800

## 2016-06-15 MED ORDER — DIPHENHYDRAMINE HCL 50 MG/ML IJ SOLN
12.5000 mg | Freq: Once | INTRAMUSCULAR | Status: AC
  Administered 2016-06-15: 12.5 mg via INTRAVENOUS

## 2016-06-15 MED ORDER — DEXTROSE 5 % IV SOLN: 1.0000 g | INTRAVENOUS | Status: DC

## 2016-06-15 NOTE — Clinical Social Work Note (Signed)
Clinical Social Work Assessment  Patient Details  Name: [name] MRN: [id number] Date of Birth: 08-10-1946  Date of referral:  06/15/16               Reason for consult:  Facility Placement                Permission sought to share information with:  Chartered certified accountant granted to share information::  Yes, Verbal Permission Granted  Name::        Agency::     Relationship::     Contact Information:     Housing/Transportation Living arrangements for the past 2 months:  Single Family Home Source of Information:  Patient Patient Interpreter Needed:  None Criminal Activity/Legal Involvement Pertinent to Current Situation/Hospitalization:  No - Comment as needed Significant Relationships:  Adult Children Lives with:  Self Do you feel safe going back to the place where you live?    Need for family participation in patient care:     Care giving concerns:  Patient requires assistance with ADL's currently and lives alone.   Social Worker assessment / plan:  CSW attempted to speak with patient regarding PT recommendations for STR. Patient was able to answer my questions appropriately but had a flat affect and was tired and not wanting to discuss much at this time. Patient took a long pause regarding if she wanted to consider STR and then looked away and stated "I just don't know." When asked if it would be okay for CSW to begin the process of a bedsearch, she replied "That would be ok." CSW informed patient that CSW would follow up with her and would let her rest for now.  Employment status:  Retired Nurse, adult PT Recommendations:  [place] / Referral to community resources:     Patient/Family's Response to care:   Patient expressed appreciation for CSW visit.  Patient/Family's Understanding of and Emotional Response to Diagnosis, Current Treatment, and Prognosis:  Patient overwhelmed with her extended  hospitalization and potential need for rehab. Patient is aware of her limitations but is having a difficult time with adjusting.  Emotional Assessment Appearance:  Appears stated age Attitude/Demeanor/Rapport:  Avoidant Affect (typically observed):  Calm, Apprehensive Orientation:  Oriented to Self, Oriented to Place, Oriented to  Time, Oriented to Situation Alcohol / Substance use:  Not Applicable Psych involvement (Current and /or in the community):  No (Comment)  Discharge Needs  Concerns to be addressed:  Care Coordination Readmission within the last 30 days:  No Current discharge risk:  None Barriers to Discharge:  No Barriers Identified   [name] Leff, LCSW 06/15/2016, 1:25 PM

## 2016-06-15 NOTE — Progress Notes (Signed)
Nutrition Follow-up  DOCUMENTATION CODES:   Not applicable  INTERVENTION:  -TPN: recommend continuing 5%AA/20%Dextrose at rate of 75 ml/hr; follow renal function, I/O. Noted change in IV fluids. May need to make further adjustments regarding TPN. Continue to assess  NUTRITION DIAGNOSIS:   Inadequate oral intake related to altered GI function as evidenced by NPO status.  Being addressed via TPN  GOAL:   Patient will meet greater than or equal to 90% of their needs  MONITOR:   Diet advancement (TPN)  REASON FOR ASSESSMENT:   Consult New TPN/TNA  ASSESSMENT:   TPN 5%AA/20%Dextrose with Electrolytes/MVI/Trace Minerals and insulin (50 units) at rate of 75 ml/hr infusing via right IJ CVC    I/O: UOP 1325 mL, 1425 mL stool via ileostomy. No output via NG, noted plan to clamp NG today  Noted Creatinine up, nephrology following  Diet Order:  Diet NPO time specified Except for: Sips with Meds .TPN (CLINIMIX-E) Adult  Skin:  Reviewed, no issues  Last BM:  8/21 noted 469ml output via ileostomy   Labs: Creatinine 2.18 (up from 1.7), BUN 58  Glucose Profile:  Recent Labs  06/15/16 0438 06/15/16 0516 06/15/16 0735  GLUCAP 126* 132* 123*    Meds: ss novolog, lantus, LR at 50 ml/hr started today  Height:   Ht Readings from Last 1 Encounters:  06/08/16 5\' 5"  (1.651 m)    Weight: weight relatively stable  Wt Readings from Last 1 Encounters:  06/15/16 228 lb (103.4 kg)    Filed Weights   06/13/16 0413 06/14/16 0445 06/15/16 0443  Weight: 230 lb 3.2 oz (104.4 kg) 226 lb 11.2 oz (102.8 kg) 228 lb (103.4 kg)   BMI:  Body mass index is 37.94 kg/m.  Estimated Nutritional Needs:   Kcal:  1500-1900 kcals/d  Protein:  75-95 g/d  Fluid:  >/= 1571ml/d  EDUCATION NEEDS:   No education needs identified at this time  [place], [place], [place] [phone] Pager  [phone] Weekend/On-Call Pager

## 2016-06-15 NOTE — Progress Notes (Signed)
[facility]  ROUNDING NOTE   Subjective:   Ms. [name] was admitted on 06/08/2016 for Diverticulitis of large intestine with perforation and abscess without bleeding [K57.20] which required Exlap with colectomy by Dr. Pat Patrick on 8/16. She is currently on TPN.   Creatinine on admission of 2.39 which then improved to 1.7 but then increased to 2.2 today and nephrology was called. Patient known to our practice with history of chronic kidney disease stage IV. Last seen in our office in 2015 but has been seen several times during her frequent hospitalizations.   Patient is feeling bad today. Complains of abdominal pain. Did have a bowel movement this morning.   Objective:  Vital signs in last 24 hours:  Temp:  [98.3 F (36.8 C)-98.4 F (36.9 C)] 98.4 F (36.9 C) (08/22 0539) Pulse Rate:  [75-98] 75 (08/22 0720) Resp:  [20] 20 (08/22 0539) BP: (120-154)/(42-60) 130/60 (08/22 1055) SpO2:  [98 %-100 %] 100 % (08/22 0539) Weight:  [103.4 kg (228 lb)] 103.4 kg (228 lb) (08/22 0443)  Weight change: 0.59 kg (1 lb 4.8 oz) Filed Weights   06/13/16 0413 06/14/16 0445 06/15/16 0443  Weight: 104.4 kg (230 lb 3.2 oz) 102.8 kg (226 lb 11.2 oz) 103.4 kg (228 lb)    Intake/Output: I/O last 3 completed shifts: In: 3921 [I.V.:3751; IV Piggyback:170] Out: [id number] [Urine:1975; [id number]   Intake/Output this shift:  Total I/O In: 103 [I.V.:103] Out: 100 [Stool:100]  Physical Exam: General: NAD, sitting in chair  Head: Normocephalic, atraumatic. Moist oral mucosal membranes  Eyes: Anicteric, PERRL  Neck: Supple, trachea midline  Lungs:  Clear to auscultation  Heart: Regular rate and rhythm  Abdomen:  +tender , Dressings clean, +ostomy, +bowel sounds  Extremities:  + peripheral edema.  Neurologic: Nonfocal, moving all four extremities  Skin: No lesions       Basic Metabolic Panel:  Recent Labs Lab 06/10/16 0456 06/11/16 0503 06/12/16 0450 06/13/16 0418 06/14/16 0452  06/15/16 0445  NA 137 135 136 136 138 136  K 4.6 4.4 4.3 3.7 3.5 3.5  CL 110 108 110 107 109 108  CO2 20* 20* 23 22 23 23   GLUCOSE 152* 251* 239* 278* 102* 120*  BUN 37* 34* 33* 37* 47* 58*  CREATININE 1.97* 1.95* 1.63* 1.66* 1.70* 2.18*  CALCIUM 7.8* 8.6* 9.1 9.1 8.7* 8.3*  MG 1.4* 1.8 1.9 1.6* 1.9 1.8  PHOS 3.4 2.1* 1.4* 3.1  --  3.9    Liver Function Tests:  Recent Labs Lab 06/11/16 0503  AST 20  ALT 10*  ALKPHOS 67  BILITOT 0.7  PROT 6.2*  ALBUMIN 2.8*   No results for input(s): LIPASE, AMYLASE in the last 168 hours. No results for input(s): AMMONIA in the last 168 hours.  CBC:  Recent Labs Lab 06/11/16 0503 06/12/16 0450 06/13/16 0418 06/14/16 0452 06/15/16 0445  WBC 13.5* 15.3* 15.5* 14.6* 15.1*  HGB 8.5* 8.8* 8.8* 8.1* 7.7*  HCT 25.9* 26.5* 25.8* 24.1* 22.3*  MCV 88.9 88.1 86.0 86.7 86.0  PLT 186 227 245 282 284    Cardiac Enzymes: No results for input(s): CKTOTAL, CKMB, CKMBINDEX, TROPONINI in the last 168 hours.  BNP: Invalid input(s): POCBNP  CBG:  Recent Labs Lab 06/14/16 1643 06/14/16 2117 06/15/16 0438 06/15/16 0516 06/15/16 0735  GLUCAP 131* 155* 126* 132* 123*    Microbiology: Results for orders placed or performed during the hospital encounter of 06/08/16  CULTURE, BLOOD (ROUTINE X 2) w Reflex to ID Panel  Status: None   Collection Time: 06/08/16  6:46 PM  Result Value Ref Range Status   Specimen Description BLOOD LEFT HAND  Final   Special Requests BOTTLES DRAWN AEROBIC AND ANAEROBIC 4ML  Final   Culture NO GROWTH 5 DAYS  Final   Report Status 06/13/2016 FINAL  Final  CULTURE, BLOOD (ROUTINE X 2) w Reflex to ID Panel     Status: None   Collection Time: 06/08/16  7:03 PM  Result Value Ref Range Status   Specimen Description BLOOD LEFT HAND  Final   Special Requests BOTTLES DRAWN AEROBIC AND ANAEROBIC 5ML  Final   Culture NO GROWTH 5 DAYS  Final   Report Status 06/13/2016 FINAL  Final  Aerobic/Anaerobic Culture  (surgical/deep wound)     Status: None   Collection Time: 06/09/16 10:02 AM  Result Value Ref Range Status   Specimen Description ABDOMEN  Final   Special Requests NONE  Final   Gram Stain   Final    MODERATE WBC PRESENT,BOTH PMN AND MONONUCLEAR FEW GRAM NEGATIVE RODS RARE GRAM POSITIVE COCCI IN PAIRS RARE SQUAMOUS EPITHELIAL CELLS PRESENT    Culture   Final    FEW ESCHERICHIA COLI FEW BACTEROIDES FRAGILIS FEW BACTEROIDES THETAIOTAOMICRON BETA LACTAMASE POSITIVE Performed at [facility]    Report Status 06/13/2016 FINAL  Final   Organism ID, Bacteria ESCHERICHIA COLI  Final      Susceptibility   Escherichia coli - MIC*    AMPICILLIN 4 SENSITIVE Sensitive     CEFAZOLIN <=4 SENSITIVE Sensitive     CEFEPIME <=1 SENSITIVE Sensitive     CEFTAZIDIME <=1 SENSITIVE Sensitive     CEFTRIAXONE <=1 SENSITIVE Sensitive     CIPROFLOXACIN <=0.25 SENSITIVE Sensitive     GENTAMICIN <=1 SENSITIVE Sensitive     IMIPENEM <=0.25 SENSITIVE Sensitive     TRIMETH/SULFA <=20 SENSITIVE Sensitive     AMPICILLIN/SULBACTAM 4 SENSITIVE Sensitive     PIP/TAZO <=4 SENSITIVE Sensitive     Extended ESBL NEGATIVE Sensitive     * FEW ESCHERICHIA COLI    Coagulation Studies: No results for input(s): LABPROT, INR in the last 72 hours.  Urinalysis: No results for input(s): COLORURINE, LABSPEC, PHURINE, GLUCOSEU, HGBUR, BILIRUBINUR, KETONESUR, PROTEINUR, UROBILINOGEN, NITRITE, LEUKOCYTESUR in the last 72 hours.  Invalid input(s): APPERANCEUR    Imaging: Dg Abd Acute W/chest  Result Date: 06/15/2016 CLINICAL DATA:  Status post sigmoid colectomy 5 days ago; history of CHF, coronary artery disease, former smoker, diabetes EXAM: DG ABDOMEN ACUTE W/ 1V CHEST COMPARISON:  Chest x-ray of June 11, 2016 and abdominal radiograph of June 13, 2016. FINDINGS: The lungs are adequately inflated. The interstitial markings are mildly prominent. There is no alveolar infiltrate. There is no large pleural effusion.  The cardiac silhouette remains enlarged. The central pulmonary vascularity is engorged. A right internal jugular venous catheter tip projects over the proximal SVC. Within the abdomen there is contrast in the right colon as well as in the rectum. There are loops of mildly distended gas-filled small bowel to the left of midline. These are less conspicuous than on yesterday's study despite removal of the nasogastric tube. Vertical lower abdominal and pelvic skin staples are present with the drainage tube. There is calcification that projects over the lower pole of the right kidney. There may be calcifications overlying the left kidney. The bony structures exhibit no acute abnormalities. IMPRESSION: Cardiomegaly with mild pulmonary vascular prominence consistent with low-grade CHF. No pneumonia or significant pleural effusion. Decreased gaseous distention  of small-bowel loops. Electronically Signed   By: David  Martinique M.D.   On: 06/15/2016 07:43   Dg Abd Portable 1v  Result Date: 06/13/2016 CLINICAL DATA:  Readjustment of NG tube EXAM: PORTABLE ABDOMEN - 1 VIEW COMPARISON:  06/13/2016 FINDINGS: Distended small bowel loops are noted mid abdomen suspicious for small bowel obstruction. NG tube with tip in distal stomach. IMPRESSION: Distended small bowel loops mid abdomen again noted. NG tube with tip in distal stomach. Electronically Signed   By: Lahoma Crocker M.D.   On: 06/13/2016 12:07     Medications:   . Marland KitchenTPN (CLINIMIX-E) Adult 75 mL/hr at 06/15/16 0740  . lactated ringers 50 mL/hr at 06/15/16 0857   . amLODipine  10 mg Oral Daily  . clindamycin (CLEOCIN) IV  900 mg Intravenous Q8H  . enoxaparin (LOVENOX) injection  40 mg Subcutaneous Q24H  . famotidine (PEPCID) IV  20 mg Intravenous Q24H  . insulin aspart  0-15 Units Subcutaneous Q4H  . insulin glargine  15 Units Subcutaneous Daily  . metoprolol  5 mg Intravenous Q8H   acetaminophen, hydrALAZINE, ipratropium-albuterol, morphine injection,  ondansetron **OR** ondansetron (ZOFRAN) IV, promethazine  Assessment/ Plan:  Ms. JUNEROSE ANDREASON is a 70 y.o. black female with hypertension, diabetes mellitus type II, hyperlipidemia, coronary artery disease, hypothyroidism, temporal arteritis, osteoarthritis. Admitted on 06/08/2016 for sigmoid diverticulitis status post colectomy  1. Acute Renal Failure on chronic kidney disease stage IV with proteinuria: baseline creatinine of 1.8-2.2. Chronic kidney disease secondary to diabetic nephropathy.  Acute renal failure seems to be prerenal in nature with no PO intake. Currently on IV fluids and TPN.  - Agree with IV fluids: LR at 49mL/hr - Continue TPN, electrolytes are stable.  - Renally dose all medications.   2. Hypertension: at goal. With peripheral edema, most likely due to hypoalbuminemia. IV albumin administered today 8/22 - amlodipine. Holding home lisinopril and hydrochlorothiazide  3. Diabetes Mellitus type II with chronic kidney disease: insulin dependent. Hemoglobin A1c of 8.6% on 02/24/16.  - Continue glucose control.     LOS: 7 Anala Whisenant 8/22/201711:20 AM

## 2016-06-15 NOTE — Progress Notes (Signed)
CC: Status post sigmoid colectomy and loop ileostomy for perforated diverticulitis  Subjective: Ileus resolving now having good ostomy output and passing gas and having a bowel movement Creatinine increased Persistent elevated white count She overall feels better today  Objective: Vital signs in last 24 hours: Temp:  [98.3 F (36.8 C)-98.4 F (36.9 C)] 98.4 F (36.9 C) (08/22 0539) Pulse Rate:  [75-98] 92 (08/22 1404) Resp:  [20] 20 (08/22 0539) BP: (120-154)/(42-62) 134/62 (08/22 1404) SpO2:  [98 %-100 %] 100 % (08/22 1404) Weight:  [103.4 kg (228 lb)] 103.4 kg (228 lb) (08/22 0443) Last BM Date: 06/14/16  Intake/Output from previous day: 08/21 0701 - 08/22 0700 In: 3426 [I.V.:3294; IV Piggyback:132] Out: 2750 [Urine:1325; Stool:1425] Intake/Output this shift: Total I/O In: 623 [I.V.:623] Out: 600 [Stool:600]  Physical exam: NAD  Abd: incision c/d/i, no infection, ostomy viable, no peritonitis  Lab Results: CBC   Recent Labs  06/14/16 0452 06/15/16 0445  WBC 14.6* 15.1*  HGB 8.1* 7.7*  HCT 24.1* 22.3*  PLT 282 284   BMET  Recent Labs  06/14/16 0452 06/15/16 0445  NA 138 136  K 3.5 3.5  CL 109 108  CO2 23 23  GLUCOSE 102* 120*  BUN 47* 58*  CREATININE 1.70* 2.18*  CALCIUM 8.7* 8.3*   PT/INR No results for input(s): LABPROT, INR in the last 72 hours. ABG No results for input(s): PHART, HCO3 in the last 72 hours.  Invalid input(s): PCO2, PO2  Studies/Results: Dg Abd Acute W/chest  Result Date: 06/15/2016 CLINICAL DATA:  Status post sigmoid colectomy 5 days ago; history of CHF, coronary artery disease, former smoker, diabetes EXAM: DG ABDOMEN ACUTE W/ 1V CHEST COMPARISON:  Chest x-ray of June 11, 2016 and abdominal radiograph of June 13, 2016. FINDINGS: The lungs are adequately inflated. The interstitial markings are mildly prominent. There is no alveolar infiltrate. There is no large pleural effusion. The cardiac silhouette remains enlarged.  The central pulmonary vascularity is engorged. A right internal jugular venous catheter tip projects over the proximal SVC. Within the abdomen there is contrast in the right colon as well as in the rectum. There are loops of mildly distended gas-filled small bowel to the left of midline. These are less conspicuous than on yesterday's study despite removal of the nasogastric tube. Vertical lower abdominal and pelvic skin staples are present with the drainage tube. There is calcification that projects over the lower pole of the right kidney. There may be calcifications overlying the left kidney. The bony structures exhibit no acute abnormalities. IMPRESSION: Cardiomegaly with mild pulmonary vascular prominence consistent with low-grade CHF. No pneumonia or significant pleural effusion. Decreased gaseous distention of small-bowel loops. Electronically Signed   By: David  Martinique M.D.   On: 06/15/2016 07:43    Anti-infectives: Anti-infectives    Start     Dose/Rate Route Frequency Ordered Stop   06/15/16 1845  cefTRIAXone (ROCEPHIN) 1 g in dextrose 5 % 50 mL IVPB     1 g 100 mL/hr over 30 Minutes Intravenous Every 24 hours 06/15/16 1844     06/13/16 1930  clindamycin (CLEOCIN) IVPB 900 mg     900 mg 100 mL/hr over 30 Minutes Intravenous Every 8 hours 06/13/16 1438     06/13/16 0930  clindamycin (CLEOCIN) IVPB 900 mg  Status:  Discontinued     900 mg 100 mL/hr over 30 Minutes Intravenous Every 8 hours 06/13/16 0929 06/13/16 1438   06/10/16 1015  imipenem-cilastatin (PRIMAXIN) 500 mg in sodium chloride 0.9 %  100 mL IVPB  Status:  Discontinued     500 mg 200 mL/hr over 30 Minutes Intravenous Every 8 hours 06/10/16 1011 06/13/16 0927   06/09/16 0930  imipenem-cilastatin (PRIMAXIN) 500 mg in sodium chloride 0.9 % 100 mL IVPB  Status:  Discontinued     500 mg 200 mL/hr over 30 Minutes Intravenous Every 12 hours 06/09/16 0741 06/10/16 1011   06/08/16 0600  imipenem-cilastatin (PRIMAXIN) 300 mg in sodium  chloride 0.9 % 100 mL IVPB  Status:  Discontinued     300 mg 200 mL/hr over 30 Minutes Intravenous Every 6 hours 06/08/16 0519 06/09/16 0741      Assessment/Plan: Diverticulitis status post sigmoid colectomy with primary anastomosis and loop ileostomy. Now with acute kidney injury and we will consult nephrology to manage her chronic renal insufficiency. I do think that she may need more fluids and I have added more crystalloids. I also gave and albumin dose this morning. We will add ceftriaxone since I do think that clindamycin will not be enough to treat her Escherichia coli from the diverticulitis. Advanced to clear liquid diet and continue TPN for now. If her white count continues to increase and we'll have to repeat a CT scan of the abdomen and pelvis. For now we'll hold off and monitor her clinically.  Melina Fiddler, MD, [facility]  06/15/2016

## 2016-06-15 NOTE — Progress Notes (Signed)
Order for enoxaparin 40 mg subcutaneously daily changed to 30 mg per anticoagulation protocol for CrCl < 30 mL/min.  Lenis Noon, PharmD 06/15/16 1:02 PM

## 2016-06-15 NOTE — Consult Note (Signed)
[facility]                                                                                                                                                                                            Patient Demographics   [name], is a 70 y.o. female, DOB - 1946-01-16, [id number]  Admit date - 06/08/2016   Admitting Physician Clayburn Pert, MD  Outpatient Primary MD for the patient is Singh,[name], MD   LOS - 7  Subjective   NGT out today. Some nausea Abd pain improving  Review of Systems:   CONSTITUTIONAL: No documented fever. Positive for fatigue and weakness. No weight gain, no weight loss.  EYES: No blurry or double vision.  ENT: No tinnitus. No postnasal drip. No redness of the oropharynx.  RESPIRATORY: No cough, Positive wheeze, no hemoptysis. No dyspnea.  CARDIOVASCULAR: No chest pain. No orthopnea. No palpitations. No syncope.  GASTROINTESTINAL: Nausea. No vomiting. Abdominal pain present. GENITOURINARY: No dysuria or hematuria.  ENDOCRINE: No polyuria or nocturia. No heat or cold intolerance.  HEMATOLOGY: No anemia. No bruising. No bleeding.  INTEGUMENTARY: No rashes. No lesions.  MUSCULOSKELETAL: No arthritis. No swelling. No gout.  NEUROLOGIC: No numbness, tingling, or ataxia. No seizure-type activity.  PSYCHIATRIC: No anxiety. No insomnia. No ADD.   Vitals:   Vitals:   06/15/16 0443 06/15/16 0539 06/15/16 0720 06/15/16 1055  BP:  (!) 122/42 (!) 120/42 130/60  Pulse:  98 75   Resp:  20    Temp:  98.4 F (36.9 C)    TempSrc:  Oral    SpO2:  100%    Weight: 103.4 kg (228 lb)     Height:        Wt Readings from Last 3 Encounters:  06/15/16 103.4 kg (228 lb)  02/17/16 99.3 kg (219 lb)  11/05/15 97.7 kg (215 lb 5 oz)     Intake/Output Summary (Last 24 hours) at 06/15/16 1305 Last data filed at 06/15/16 1150  Gross per 24 hour  Intake             2547 ml  Output             2450 ml  Net                97 ml    Physical Exam:   GENERAL: Pleasant-appearing in no apparent distress.  HEAD, EYES, EARS, NOSE AND THROAT: Atraumatic, normocephalic. Extraocular muscles are intact. Pupils equal and reactive to light. Sclerae anicteric. No conjunctival injection. No oro-pharyngeal erythema.  NECK: Supple. There is no jugular venous distention. No  bruits, no lymphadenopathy, no thyromegaly.  HEART: Regular rate and rhythm,. No murmurs, no rubs, no clicks.  LUNGS: Clear to auscultation bilaterally. No rales or rhonchi. No wheezes.  ABDOMEN: Postop. Dressing. Some ostomy output.  Bowel sounds decreased  EXTREMITIES: No evidence of any cyanosis, clubbing, or peripheral edema.  +2 pedal and radial pulses bilaterally.  NEUROLOGIC: The patient is alert, awake, and oriented x3 with no focal motor or sensory deficits appreciated bilaterally.  SKIN: Moist and warm with no rashes appreciated.  Psych: Not anxious, depressed LN: No inguinal LN enlargement    Antibiotics   Anti-infectives    Start     Dose/Rate Route Frequency Ordered Stop   06/13/16 1930  clindamycin (CLEOCIN) IVPB 900 mg     900 mg 100 mL/hr over 30 Minutes Intravenous Every 8 hours 06/13/16 1438     06/13/16 0930  clindamycin (CLEOCIN) IVPB 900 mg  Status:  Discontinued     900 mg 100 mL/hr over 30 Minutes Intravenous Every 8 hours 06/13/16 0929 06/13/16 1438   06/10/16 1015  imipenem-cilastatin (PRIMAXIN) 500 mg in sodium chloride 0.9 % 100 mL IVPB  Status:  Discontinued     500 mg 200 mL/hr over 30 Minutes Intravenous Every 8 hours 06/10/16 1011 06/13/16 0927   06/09/16 0930  imipenem-cilastatin (PRIMAXIN) 500 mg in sodium chloride 0.9 % 100 mL IVPB  Status:  Discontinued     500 mg 200 mL/hr over 30 Minutes Intravenous Every 12 hours 06/09/16 0741 06/10/16 1011   06/08/16 0600  imipenem-cilastatin (PRIMAXIN) 300 mg in sodium chloride 0.9 % 100 mL IVPB  Status:  Discontinued     300 mg 200 mL/hr over 30 Minutes Intravenous  Every 6 hours 06/08/16 0519 06/09/16 0741      Medications   Scheduled Meds: . amLODipine  10 mg Oral Daily  . clindamycin (CLEOCIN) IV  900 mg Intravenous Q8H  . [START ON 06/16/2016] enoxaparin (LOVENOX) injection  30 mg Subcutaneous Q24H  . famotidine (PEPCID) IV  20 mg Intravenous Q24H  . insulin aspart  0-15 Units Subcutaneous Q4H  . insulin glargine  15 Units Subcutaneous Daily  . metoprolol  5 mg Intravenous Q8H   Continuous Infusions: . Marland KitchenTPN (CLINIMIX-E) Adult 75 mL/hr at 06/15/16 1150  . lactated ringers 50 mL/hr at 06/15/16 0857   PRN Meds:.acetaminophen, hydrALAZINE, ipratropium-albuterol, morphine injection, ondansetron **OR** ondansetron (ZOFRAN) IV, promethazine   Data Review:   Micro Results Recent Results (from the past 240 hour(s))  CULTURE, BLOOD (ROUTINE X 2) w Reflex to ID Panel     Status: None   Collection Time: 06/08/16  6:46 PM  Result Value Ref Range Status   Specimen Description BLOOD LEFT HAND  Final   Special Requests BOTTLES DRAWN AEROBIC AND ANAEROBIC 4ML  Final   Culture NO GROWTH 5 DAYS  Final   Report Status 06/13/2016 FINAL  Final  CULTURE, BLOOD (ROUTINE X 2) w Reflex to ID Panel     Status: None   Collection Time: 06/08/16  7:03 PM  Result Value Ref Range Status   Specimen Description BLOOD LEFT HAND  Final   Special Requests BOTTLES DRAWN AEROBIC AND ANAEROBIC 5ML  Final   Culture NO GROWTH 5 DAYS  Final   Report Status 06/13/2016 FINAL  Final  Aerobic/Anaerobic Culture (surgical/deep wound)     Status: None   Collection Time: 06/09/16 10:02 AM  Result Value Ref Range Status   Specimen Description ABDOMEN  Final   Special Requests NONE  Final   Gram Stain   Final    MODERATE WBC PRESENT,BOTH PMN AND MONONUCLEAR FEW GRAM NEGATIVE RODS RARE GRAM POSITIVE COCCI IN PAIRS RARE SQUAMOUS EPITHELIAL CELLS PRESENT    Culture   Final    FEW ESCHERICHIA COLI FEW BACTEROIDES FRAGILIS FEW BACTEROIDES THETAIOTAOMICRON BETA LACTAMASE  POSITIVE Performed at [facility]    Report Status 06/13/2016 FINAL  Final   Organism ID, Bacteria ESCHERICHIA COLI  Final      Susceptibility   Escherichia coli - MIC*    AMPICILLIN 4 SENSITIVE Sensitive     CEFAZOLIN <=4 SENSITIVE Sensitive     CEFEPIME <=1 SENSITIVE Sensitive     CEFTAZIDIME <=1 SENSITIVE Sensitive     CEFTRIAXONE <=1 SENSITIVE Sensitive     CIPROFLOXACIN <=0.25 SENSITIVE Sensitive     GENTAMICIN <=1 SENSITIVE Sensitive     IMIPENEM <=0.25 SENSITIVE Sensitive     TRIMETH/SULFA <=20 SENSITIVE Sensitive     AMPICILLIN/SULBACTAM 4 SENSITIVE Sensitive     PIP/TAZO <=4 SENSITIVE Sensitive     Extended ESBL NEGATIVE Sensitive     * FEW ESCHERICHIA COLI    Radiology Reports Ct Abdomen Pelvis W Contrast  Result Date: 06/08/2016 CLINICAL DATA:  Urinary frequency for 2 weeks. Pelvic and suprapubic pain since yesterday. Low back pain. Nausea. EXAM: CT ABDOMEN AND PELVIS WITH CONTRAST TECHNIQUE: Multidetector CT imaging of the abdomen and pelvis was performed using the standard protocol following bolus administration of intravenous contrast. CONTRAST:  69mL ISOVUE-300 IOPAMIDOL (ISOVUE-300) INJECTION 61% COMPARISON:  None. FINDINGS: Dependent atelectasis in the lung bases. There is pneumoperitoneum with free intra-abdominal air throughout the abdomen and pelvis. Mild infiltration in the mesentery. Small amount of free fluid in the pelvis. Diverticula in the sigmoid colon with inflammatory stranding around the proximal sigmoid colon. Small pericolonic abscess measuring 1.4 x 2.7 cm. Changes are consistent with perforated acute diverticulitis. No small or large bowel distention. Small esophageal hiatal hernia. The liver, spleen, gallbladder, pancreas, adrenal glands, kidneys, abdominal aorta, inferior vena cava, and retroperitoneal lymph nodes are unremarkable. Pelvis: Bladder wall is not thickened. No pelvic mass or lymphadenopathy. Appendix is normal. No destructive bone  lesions. IMPRESSION: Perforated acute diverticulitis with diffuse free intraperitoneal air. Small pericolonic abscess adjacent to the junction of the descending and sigmoid colon. These results were called by telephone at the time of interpretation on 06/08/2016 at 3:07 am to Dr. Charlesetta Ivory , who verbally acknowledged these results. Electronically Signed   By: Lucienne Capers M.D.   On: 06/08/2016 03:28   Dg Chest Port 1 View  Result Date: 06/11/2016 CLINICAL DATA:  Onset of wheezing today; history of CHF, coronary artery disease, former smoker EXAM: PORTABLE CHEST 1 VIEW COMPARISON:  PA and lateral chest x-ray of June 09, 2016 FINDINGS: The lungs are adequately inflated. There is no focal infiltrate. There is no pleural effusion or pneumothorax. The cardiac silhouette remains enlarged. The pulmonary vascularity is slightly more conspicuous and mild interstitial edema on the right is suspected. The right internal jugular venous catheter tip projects over the midportion of the SVC. The bony thorax exhibits no acute abnormality. IMPRESSION: Low-grade CHF with mild interstitial edema more conspicuous than on yesterday's study. No alveolar pneumonia. Electronically Signed   By: David  Martinique M.D.   On: 06/11/2016 08:45   Dg Chest Port 1 View  Result Date: 06/09/2016 CLINICAL DATA:  Central line placement EXAM: PORTABLE CHEST 1 VIEW COMPARISON:  11/05/2015 FINDINGS: Mild cardiomegaly. Mediastinal shadows are normal. Right internal  jugular central line has its tip in the SVC just below the azygos level. No pneumothorax or hemothorax. Lungs clear. IMPRESSION: Central line tip in the SVC just below the azygos level, 3 cm above the right atrium. No complication. Electronically Signed   By: Nelson Chimes M.D.   On: 06/09/2016 15:44   Dg Abd 2 Views  Result Date: 06/13/2016 CLINICAL DATA:  Status post repair of sigmoid diverticulitis. Nausea and vomiting. EXAM: ABDOMEN - 2 VIEW COMPARISON:  CT 06/08/2016  FINDINGS: Supine views of the abdomen and pelvis. No gross free intraperitoneal air. Left pelvic surgical clips with probable surgical drain within. Mid small bowel loops measure up to 4.0 cm. Contrast within normal caliber ascending colon. IMPRESSION: Mild small bowel dilatation within the mid abdomen, favoring adynamic ileus. Low-grade partial small bowel obstruction could look similar. Electronically Signed   By: Abigail Miyamoto M.D.   On: 06/13/2016 09:33   Dg Abd Acute W/chest  Result Date: 06/15/2016 CLINICAL DATA:  Status post sigmoid colectomy 5 days ago; history of CHF, coronary artery disease, former smoker, diabetes EXAM: DG ABDOMEN ACUTE W/ 1V CHEST COMPARISON:  Chest x-ray of June 11, 2016 and abdominal radiograph of June 13, 2016. FINDINGS: The lungs are adequately inflated. The interstitial markings are mildly prominent. There is no alveolar infiltrate. There is no large pleural effusion. The cardiac silhouette remains enlarged. The central pulmonary vascularity is engorged. A right internal jugular venous catheter tip projects over the proximal SVC. Within the abdomen there is contrast in the right colon as well as in the rectum. There are loops of mildly distended gas-filled small bowel to the left of midline. These are less conspicuous than on yesterday's study despite removal of the nasogastric tube. Vertical lower abdominal and pelvic skin staples are present with the drainage tube. There is calcification that projects over the lower pole of the right kidney. There may be calcifications overlying the left kidney. The bony structures exhibit no acute abnormalities. IMPRESSION: Cardiomegaly with mild pulmonary vascular prominence consistent with low-grade CHF. No pneumonia or significant pleural effusion. Decreased gaseous distention of small-bowel loops. Electronically Signed   By: David  Martinique M.D.   On: 06/15/2016 07:43   Dg Abd Portable 1v  Result Date: 06/13/2016 CLINICAL DATA:   Readjustment of NG tube EXAM: PORTABLE ABDOMEN - 1 VIEW COMPARISON:  06/13/2016 FINDINGS: Distended small bowel loops are noted mid abdomen suspicious for small bowel obstruction. NG tube with tip in distal stomach. IMPRESSION: Distended small bowel loops mid abdomen again noted. NG tube with tip in distal stomach. Electronically Signed   By: Lahoma Crocker M.D.   On: 06/13/2016 12:07     CBC  Recent Labs Lab 06/11/16 0503 06/12/16 0450 06/13/16 0418 06/14/16 0452 06/15/16 0445  WBC 13.5* 15.3* 15.5* 14.6* 15.1*  HGB 8.5* 8.8* 8.8* 8.1* 7.7*  HCT 25.9* 26.5* 25.8* 24.1* 22.3*  PLT 186 227 245 282 284  MCV 88.9 88.1 86.0 86.7 86.0  MCH 29.1 29.1 29.3 29.3 29.8  MCHC 32.8 33.1 34.1 33.8 34.6  RDW 15.3* 15.1* 15.1* 15.3* 15.1*    Chemistries   Recent Labs Lab 06/11/16 0503 06/12/16 0450 06/13/16 0418 06/14/16 0452 06/15/16 0445  NA 135 136 136 138 136  K 4.4 4.3 3.7 3.5 3.5  CL 108 110 107 109 108  CO2 20* 23 22 23 23   GLUCOSE 251* 239* 278* 102* 120*  BUN 34* 33* 37* 47* 58*  CREATININE 1.95* 1.63* 1.66* 1.70* 2.18*  CALCIUM 8.6*  9.1 9.1 8.7* 8.3*  MG 1.8 1.9 1.6* 1.9 1.8  AST 20  --   --   --   --   ALT 10*  --   --   --   --   ALKPHOS 67  --   --   --   --   BILITOT 0.7  --   --   --   --    ------------------------------------------------------------------------------------------------------------------ estimated creatinine clearance is 28.7 mL/min (by C-G formula based on SCr of 2.18 mg/dL). ------------------------------------------------------------------------------------------------------------------ No results for input(s): HGBA1C in the last 72 hours. ------------------------------------------------------------------------------------------------------------------ No results for input(s): CHOL, HDL, LDLCALC, TRIG, CHOLHDL, LDLDIRECT in the last 72  hours. ------------------------------------------------------------------------------------------------------------------ No results for input(s): TSH, T4TOTAL, T3FREE, THYROIDAB in the last 72 hours.  Invalid input(s): FREET3 ------------------------------------------------------------------------------------------------------------------ No results for input(s): VITAMINB12, FOLATE, FERRITIN, TIBC, IRON, RETICCTPCT in the last 72 hours.  Coagulation profile No results for input(s): INR, PROTIME in the last 168 hours.  No results for input(s): DDIMER in the last 72 hours.  Cardiac Enzymes No results for input(s): CKMB, TROPONINI, MYOGLOBIN in the last 168 hours.  Invalid input(s): CK ------------------------------------------------------------------------------------------------------------------ Invalid input(s): [place]   Bergen Justin  is a 70 y.o. female with a known history of Hypertension, insulin-dependent diabetes mellitus, CK D, rheumatoid arthritis, hypothyroidism presents to the hospital secondary to worsening abdominal pain and noted to have perforated sigmoid diverticulitis.  * # Acute kidney injury over CKD stage III - worsening Started on LR. Continue I/Os Nephrology consulted. Appreciate input  # Essential hypertension -  Scheduled IV Lopressor - Continue to hold hydrochlorothiazide. Discontinue lisinopril due to worsening BUN/Cr. - IV hydralazine as needed.  # Insulin-dependent diabetes mellitus - well controlled Patient is getting insulin to TPN, Lantus and scheduled NovoLog. 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 Resolved  # Rheumatoid arthritis-follows with rheumatology.  On very low-dose prednisone for maintenance and also recent vasculitis episode.   # Perforated sigmoid status post surgery ON clindamycin NG tube being placed today due to  ileus  # acute blood loss anemia over AOCD Monitor Transfuse if < 7     Code Status Orders        Start     Ordered   06/08/16 0631  Full code  Continuous     06/08/16 0630    Code Status History    Date Active Date Inactive Code Status Order ID Comments User Context   11/05/2015 10:26 AM 11/10/2015  5:22 PM Full Code [id number]  Saundra Shelling, MD ED   11/05/2015  7:14 AM 11/05/2015 10:26 AM Full Code [id number]  Saundra Shelling, MD ED     DVT Prophylaxis SCDs  Lab Results  Component Value Date   PLT 284 06/15/2016   Time Spent in minutes 25 minute   Hillary Bow R M.D on 06/15/2016 at 1:05 PM  Between 7am to 6pm - Pager - [phone]  After 6pm go to [email or web address] - password [email or web address] [facility]   Office  [phone]

## 2016-06-15 NOTE — NC FL2 (Signed)
[place] LEVEL OF CARE SCREENING TOOL     IDENTIFICATION  Patient Name: [name] Birthdate: July 19, 1946 Sex: female Admission Date (Current Location): 06/08/2016  [place] and [place] Number:  Engineering geologist and Address:  [facility], [address], [place], [place] [address]      Provider Number: [id number]  Attending Physician Name and Address:  Clayburn Pert, MD  Relative Name and Phone Number:       Current Level of Care: Hospital Recommended Level of Care: [place] Prior Approval Number:    Date Approved/Denied:   PASRR Number: [id number]  Discharge Plan: SNF    Current Diagnoses: Patient Active Problem List   Diagnosis Date Noted  . Diverticulitis 06/08/2016  . Diverticulitis of large intestine with perforation and abscess without bleeding   . Demand ischemia ([place]) 11/07/2015  . Uncontrolled hypertension 11/05/2015  . Uncontrolled diabetes mellitus ([place]) 11/05/2015  . 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 Varicose veins of left lower extremity with ulcer of calf ([place]) 08/04/2015    Orientation RESPIRATION BLADDER Height & Weight     Self, Time, Situation, Place  Normal Continent Weight: 228 lb (103.4 kg) Height:  5\' 5"  (165.1 cm)  BEHAVIORAL SYMPTOMS/MOOD NEUROLOGICAL BOWEL NUTRITION STATUS   (none)  (none) Ileostomy (new ileostomy) Diet (currently npo)  AMBULATORY STATUS COMMUNICATION OF NEEDS Skin   Extensive Assist Verbally Surgical wounds                       Personal Care Assistance Level of Assistance  Bathing, Dressing Bathing Assistance: Limited assistance   Dressing Assistance: Limited assistance     Functional Limitations Info   (no issues)          SPECIAL CARE FACTORS FREQUENCY  PT (By licensed PT)                     Contractures Contractures Info: Not present    Additional Factors Info  Allergies   Allergies Info: doxycycline, levaquin, pcn's, sulfa abx           Current Medications (06/15/2016):  This is the current hospital active medication list Current Facility-Administered Medications  Medication Dose Route Frequency Provider Last Rate Last Dose  . Marland KitchenTPN (CLINIMIX-E) Adult   Intravenous Continuous TPN Clayburn Pert, MD 75 mL/hr at 06/15/16 1150    . Marland KitchenTPN (CLINIMIX-E) Adult   Intravenous Continuous TPN Lenis Noon, [facility]      . acetaminophen (TYLENOL) tablet 650 mg  650 mg Oral Q6H PRN Dia Crawford III, MD   650 mg at 06/08/16 1755  . amLODipine (NORVASC) tablet 10 mg  10 mg Oral Daily Gladstone Lighter, MD   10 mg at 06/15/16 1041  . clindamycin (CLEOCIN) IVPB 900 mg  900 mg Intravenous Q8H Clayburn Pert, MD   900 mg at 06/15/16 1150  . [START ON 06/16/2016] enoxaparin (LOVENOX) injection 30 mg  30 mg Subcutaneous Q24H Darylene Price [place], [facility]      . famotidine (PEPCID) IVPB 20 mg premix  20 mg Intravenous Q24H Dia Crawford III, MD   20 mg at 06/15/16 1042  . hydrALAZINE (APRESOLINE) injection 10 mg  10 mg Intravenous Q2H PRN Clayburn Pert, MD   10 mg at 06/08/16 0657  . insulin aspart (novoLOG) injection 0-15 Units  0-15 Units  Subcutaneous Q4H Clayburn Pert, MD   2 Units at 06/15/16 [phone]  . insulin glargine (LANTUS) injection 15 Units  15 Units Subcutaneous Daily Clayburn Pert, MD   15 Units at 06/15/16 1042  . ipratropium-albuterol (DUONEB) 0.5-2.5 (3) MG/3ML nebulizer solution 3 mL  3 mL Nebulization Q6H PRN Clayburn Pert, MD   3 mL at 06/11/16 0754  . lactated ringers infusion   Intravenous Continuous Jules Husbands, MD 50 mL/hr at 06/15/16 0857    . metoprolol (LOPRESSOR) injection 5 mg  5 mg Intravenous Q8H Srikar Sudini, MD   5 mg at 06/15/16 0557  . morphine 4 MG/ML injection 4 mg  4 mg Intravenous Q2H PRN Dia Crawford III, MD      . ondansetron (ZOFRAN-ODT) disintegrating tablet 4 mg  4  mg Oral Q6H PRN Clayburn Pert, MD       Or  . ondansetron [facility]) injection 4 mg  4 mg Intravenous Q6H PRN Clayburn Pert, MD   4 mg at 06/14/16 1520  . promethazine (PHENERGAN) injection 25 mg  25 mg Intravenous Q6H PRN Dia Crawford III, MD   25 mg at 06/13/16 1757     Discharge Medications: Please see discharge summary for a list of discharge medications.  Relevant Imaging Results:  Relevant Lab Results:   Additional Information ss: [id number]  [name] Leff, LCSW

## 2016-06-15 NOTE — Progress Notes (Signed)
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 She has continued to have difficulty with nausea, x-rays show likely ileus, treated with NG tube.     PT Comments    Pt in bed agrees to session.  Participated in exercises as described below.  She transitioned to edge of bed with hob raised and rail with increased time.  Sitting edge of bed with min guard.  She was able to stand with min assist x 2 and was able to transition to recliner at bedside.  After seated rest she was able to stand a second trial but unable to ambulate today due to nausea, abdominal discomfort and general weakness.  She stood 1 minutes on first attempt and 45 seconds on the second.  Fatigued with session but pt agreed to remain sitting in recliner at bedside.  Pt instructed to call nursing when she needed to return to bed.     Follow Up Recommendations  SNF     Equipment Recommendations  Rolling walker with 5" wheels    Recommendations for Other Services       Precautions / Restrictions Precautions Precautions: Fall Restrictions Weight Bearing Restrictions: No    Mobility  Bed Mobility Overal bed mobility: Needs Assistance Bed Mobility: Supine to Sit     Supine to sit: Min guard     General bed mobility comments: HOB raised, used rail  Transfers Overall transfer level: Needs assistance Equipment used: Rolling walker (2 wheeled) Transfers: Sit to/from Stand Sit to Stand: +2 physical assistance;Min assist         General transfer comment: less assist today. genreally steady but limited by pain fatigue and nausea  Ambulation/Gait Ambulation/Gait assistance: Min guard;+2 physical assistance Ambulation Distance (Feet): 3 Feet Assistive device: Rolling  walker (2 wheeled) Gait Pattern/deviations: Step-to pattern;Shuffle   Gait velocity interpretation: <1.8 ft/sec, indicative of risk for recurrent falls General Gait Details: limited by fatigue   Stairs            Wheelchair Mobility    Modified Rankin (Stroke Patients Only)       Balance Overall balance assessment: Needs assistance   Sitting balance-Leahy Scale: Good     Standing balance support: Bilateral upper extremity supported Standing balance-Leahy Scale: Fair                      Cognition Arousal/Alertness: Awake/alert Behavior During Therapy: WFL for tasks assessed/performed Overall Cognitive Status: Within Functional Limits for tasks assessed                      Exercises General Exercises - Lower Extremity Ankle Circles/Pumps: AROM;Both;10 reps;Supine Heel Slides: AROM;Both;10 reps;Supine Hip ABduction/ADduction: AROM;Both;10 reps;Supine Straight Leg Raises: AROM;Both;10 reps;Supine    General Comments        Pertinent Vitals/Pain      Home Living                      Prior Function            PT Goals (current goals can now be found in the care plan section)      Frequency  Min 2X/week    PT Plan Current plan remains appropriate    Co-evaluation  End of Session Equipment Utilized During Treatment: Gait belt Activity Tolerance: Patient tolerated treatment well;Patient limited by fatigue Patient left: in chair;with chair alarm set;with call bell/phone within reach     Time: 0932-0949 PT Time Calculation (min) (ACUTE ONLY): 17 min  Charges:  $Therapeutic Exercise: 8-22 mins $Therapeutic Activity: 8-22 mins                    G Codes:      Chesley Noon 07-15-2016, 10:02 AM

## 2016-06-15 NOTE — Progress Notes (Signed)
PARENTERAL NUTRITION CONSULT NOTE - INITIAL  Pharmacy Consult for TPN Indication: colon resection, NPO  Allergies  Allergen Reactions  . Doxycycline Other (See Comments)    Reaction: unknown  . Levaquin [Levofloxacin] Other (See Comments)    Causes joint pain  . Penicillins Rash and Other (See Comments)    Has patient had a PCN reaction causing immediate rash, facial/tongue/throat swelling, SOB or lightheadedness with hypotension: Yes Has patient had a PCN reaction causing severe rash involving mucus membranes or skin necrosis: No Has patient had a PCN reaction that required hospitalization No Has patient had a PCN reaction occurring within the last 10 years: No If all of the above answers are "NO", then may proceed with Cephalosporin use.  . Sulfa Antibiotics Swelling and Rash    Patient Measurements: Height: 5\' 5"  (165.1 cm) Weight: 228 lb (103.4 kg) IBW/kg (Calculated) : 57   Vital Signs: Temp: 98.4 F (36.9 C) (08/22 0539) Temp Source: Oral (08/22 0539) BP: 130/60 (08/22 1055) Pulse Rate: 75 (08/22 0720) Intake/Output from previous day: 08/21 0701 - 08/22 0700 In: [id number] [I.V.:3294; IV Piggyback:132] Out: 2750 [Urine:1325; [id number] Intake/Output from this shift: Total I/O In: 623 [I.V.:623] Out: 500 [Stool:500]  Labs:  Recent Labs  06/13/16 0418 06/14/16 0452 06/15/16 0445  WBC 15.5* 14.6* 15.1*  HGB 8.8* 8.1* 7.7*  HCT 25.8* 24.1* 22.3*  PLT 245 282 284     Recent Labs  06/13/16 0418 06/14/16 0452 06/15/16 0445  NA 136 138 136  K 3.7 3.5 3.5  CL 107 109 108  CO2 22 23 23   GLUCOSE 278* 102* 120*  BUN 37* 47* 58*  CREATININE 1.66* 1.70* 2.18*  CALCIUM 9.1 8.7* 8.3*  MG 1.6* 1.9 1.8  PHOS 3.1  --  3.9   Estimated Creatinine Clearance: 28.7 mL/min (by C-G formula based on SCr of 2.18 mg/dL).    Recent Labs  06/15/16 0516 06/15/16 0735 06/15/16 1149  GLUCAP 132* 123* 101*    Insulin Requirements in the past 24 hours:  67 units.    Pt on Lantus at home  Current Nutrition:  Clinimix E 5/20 with trace elements and MVI at 49ml/hr   Assessment: 70 yo female s/p colon resection surgery for diverticulitis on 8/16 started TPN 8/17 All electrolytes are within normal limits.  Plan:   Will decrease insulin in TPN to 40 units/day. Will continue moderate scale SSI. Will continue Lantus 15 units daily.    Pharmacy will continue to monitor and adjust per consult.   Lenis Noon, PharmD 06/15/2016,1:33 PM

## 2016-06-16 ENCOUNTER — Inpatient Hospital Stay: Payer: Commercial Managed Care - HMO

## 2016-06-16 LAB — COMPREHENSIVE METABOLIC PANEL
ALBUMIN: 2.3 g/dL — AB (ref 3.5–5.0)
ALT: 14 U/L (ref 14–54)
AST: 20 U/L (ref 15–41)
Alkaline Phosphatase: 62 U/L (ref 38–126)
Anion gap: 7 (ref 5–15)
BILIRUBIN TOTAL: 0.3 mg/dL (ref 0.3–1.2)
BUN: 64 mg/dL — AB (ref 6–20)
CO2: 22 mmol/L (ref 22–32)
CREATININE: 2.39 mg/dL — AB (ref 0.44–1.00)
Calcium: 8.5 mg/dL — ABNORMAL LOW (ref 8.9–10.3)
Chloride: 107 mmol/L (ref 101–111)
GFR calc Af Amer: 23 mL/min — ABNORMAL LOW (ref >60)
GFR, EST NON AFRICAN AMERICAN: 19 mL/min — AB (ref >60)
GLUCOSE: 155 mg/dL — AB (ref 65–99)
POTASSIUM: 3.9 mmol/L (ref 3.5–5.1)
Sodium: 136 mmol/L (ref 135–145)
TOTAL PROTEIN: 6.1 g/dL — AB (ref 6.5–8.1)

## 2016-06-16 LAB — MAGNESIUM: Magnesium: 1.8 mg/dL (ref 1.7–2.4)

## 2016-06-16 LAB — GLUCOSE, CAPILLARY
GLUCOSE-CAPILLARY: 146 mg/dL — AB (ref 65–99)
GLUCOSE-CAPILLARY: 167 mg/dL — AB (ref 65–99)
Glucose-Capillary: 117 mg/dL — ABNORMAL HIGH (ref 65–99)
Glucose-Capillary: 144 mg/dL — ABNORMAL HIGH (ref 65–99)
Glucose-Capillary: 146 mg/dL — ABNORMAL HIGH (ref 65–99)
Glucose-Capillary: 165 mg/dL — ABNORMAL HIGH (ref 65–99)

## 2016-06-16 LAB — CBC
HEMATOCRIT: 23 % — AB (ref 35.0–47.0)
Hemoglobin: 7.6 g/dL — ABNORMAL LOW (ref 12.0–16.0)
MCH: 28.9 pg (ref 26.0–34.0)
MCHC: 33.2 g/dL (ref 32.0–36.0)
MCV: 87 fL (ref 80.0–100.0)
Platelets: 305 10*3/uL (ref 150–440)
RBC: 2.64 MIL/uL — AB (ref 3.80–5.20)
RDW: 15.3 % — ABNORMAL HIGH (ref 11.5–14.5)
WBC: 14.9 10*3/uL — AB (ref 3.6–11.0)

## 2016-06-16 LAB — HEMOGLOBIN A1C: Hgb A1c MFr Bld: 7.9 % — ABNORMAL HIGH (ref 4.0–6.0)

## 2016-06-16 LAB — PHOSPHORUS: PHOSPHORUS: 4.5 mg/dL (ref 2.5–4.6)

## 2016-06-16 MED ORDER — METOPROLOL TARTRATE 50 MG PO TABS
50.0000 mg | ORAL_TABLET | Freq: Two times a day (BID) | ORAL | Status: DC
  Administered 2016-06-16 – 2016-06-22 (×13): 50 mg via ORAL
  Filled 2016-06-16 (×13): qty 1

## 2016-06-16 MED ORDER — DIATRIZOATE MEGLUMINE & SODIUM 66-10 % PO SOLN
15.0000 mL | ORAL | Status: AC
  Administered 2016-06-16 (×2): 15 mL via ORAL

## 2016-06-16 MED ORDER — DIATRIZOATE MEGLUMINE & SODIUM 66-10 % PO SOLN: 15.0000 mL | Freq: Once | ORAL | Status: DC

## 2016-06-16 MED ORDER — TRACE MINERALS CR-CU-MN-SE-ZN 10-1000-500-60 MCG/ML IV SOLN
INTRAVENOUS | Status: AC
  Administered 2016-06-16: 19:00:00 via INTRAVENOUS
  Filled 2016-06-16: qty 960

## 2016-06-16 NOTE — Consult Note (Addendum)
[place] Nurse ostomy follow up Stoma type/location: LLQ ileostomy Stomal assessment/size: 1 1/2 ' pink, moist, flush stoma with loose slough from 4-6 o'clock.   Peristomal assessment: Intact Treatment options for stomal/peristomal skin: 2 piece Hollister clear pouch [id number]) with cut to fit 1 1/2" flat barrier (14204) , barrier ring (7805) used to cut edge. Pt will need convex pouch with belt once more active and abdominal swelling has reduced.  Midline incision with staples needs to heal prior to use of belt. Output: Green clear output  Ostomy pouching: 2pc.  Education provided: Educated pt on pouch change, need for further fitting as abdomen size changes, foods to avoid to prevent obstruction (fruit/vegatables with skin,etc) eating small bites and cutting food into small pieces. Enrolled patient in [facility] Start Discharge program: No.  Pt going to rehab.  Pt will need to be enrolled at discharge from Rehab.  Nursing: Please monitor input and output. As pt comes off of fluids and TPN, she may have a fluid deficit. High risk for dehydration with high output ileostomy.  Pt had  2275 out in 24 hours.   Sticky note left for physician to provide transfer instructions for monitoring dehydration at rehab.  RD consult for appropriate diet for ileostomy to avoid obstruction.  Printed out Ileostomy Nutrition information and left at bedside for pt.  Discussed POC with patient and bedside nurse.  Re consult if needed, will not follow as pt is pending discharge to rehab.  Thank you, Dorna Bloom [name], RN, [facility]

## 2016-06-16 NOTE — Progress Notes (Signed)
Oral contrast given now.

## 2016-06-16 NOTE — Consult Note (Signed)
[place] at [facility]                                                                                                                                                                                            Patient Demographics   [name], is a 70 y.o. female, DOB - Dec 20, 1945, [id number]  Admit date - 06/08/2016   Admitting Physician Clayburn Pert, MD  Outpatient Primary MD for the patient is Singh,[name], MD   LOS - 8  Subjective   NGT out . Feels better but weak. Afebrile  Review of Systems:   CONSTITUTIONAL: No documented fever. Positive for fatigue and weakness. No weight gain, no weight loss.  EYES: No blurry or double vision.  ENT: No tinnitus. No postnasal drip. No redness of the oropharynx.  RESPIRATORY: No cough, Positive wheeze, no hemoptysis. No dyspnea.  CARDIOVASCULAR: No chest pain. No orthopnea. No palpitations. No syncope.  GASTROINTESTINAL: Nausea. No vomiting. Abdominal pain present. GENITOURINARY: No dysuria or hematuria.  ENDOCRINE: No polyuria or nocturia. No heat or cold intolerance.  HEMATOLOGY: No anemia. No bruising. No bleeding.  INTEGUMENTARY: No rashes. No lesions.  MUSCULOSKELETAL: No arthritis. No swelling. No gout.  NEUROLOGIC: No numbness, tingling, or ataxia. No seizure-type activity.  PSYCHIATRIC: No anxiety. No insomnia. No ADD.   Vitals:   Vitals:   06/16/16 0003 06/16/16 0500 06/16/16 0641 06/16/16 1142  BP: (!) 142/48  (!) 141/47 (!) 136/50  Pulse: 82  89 87  Resp:   20   Temp:   98.4 F (36.9 C)   TempSrc:   Oral   SpO2:   99%   Weight:  102.6 kg (226 lb 3.2 oz)    Height:        Wt Readings from Last 3 Encounters:  06/16/16 102.6 kg (226 lb 3.2 oz)  02/17/16 99.3 kg (219 lb)  11/05/15 97.7 kg (215 lb 5 oz)     Intake/Output Summary (Last 24 hours) at 06/16/16 1152 Last data filed at 06/16/16 1112  Gross per 24 hour  Intake           3015.9 ml  Output             2175  ml  Net            840.9 ml    Physical Exam:   GENERAL: Pleasant-appearing in no apparent distress.  HEAD, EYES, EARS, NOSE AND THROAT: Atraumatic, normocephalic. Extraocular muscles are intact. Pupils equal and reactive to light. Sclerae anicteric. No conjunctival injection. No oro-pharyngeal erythema.  NECK: Supple. There is no jugular venous distention. No  bruits, no lymphadenopathy, no thyromegaly.  HEART: Regular rate and rhythm,. No murmurs, no rubs, no clicks.  LUNGS: Clear to auscultation bilaterally. No rales or rhonchi. No wheezes.  ABDOMEN: Postop. Dressing. Some ostomy output.  Bowel sounds Normal EXTREMITIES: No evidence of any cyanosis, clubbing, or peripheral edema.  +2 pedal and radial pulses bilaterally.  NEUROLOGIC: The patient is alert, awake, and oriented x3 with no focal motor or sensory deficits appreciated bilaterally.  SKIN: Moist and warm with no rashes appreciated.  Psych: Not anxious, depressed LN: No inguinal LN enlargement    Antibiotics   Anti-infectives    Start     Dose/Rate Route Frequency Ordered Stop   06/15/16 2200  piperacillin-tazobactam (ZOSYN) IVPB 3.375 g     3.375 g 12.5 mL/hr over 240 Minutes Intravenous Every 8 hours 06/15/16 1906     06/15/16 1845  cefTRIAXone (ROCEPHIN) 1 g in dextrose 5 % 50 mL IVPB  Status:  Discontinued     1 g 100 mL/hr over 30 Minutes Intravenous Every 24 hours 06/15/16 1844 06/15/16 1858   06/13/16 1930  clindamycin (CLEOCIN) IVPB 900 mg  Status:  Discontinued     900 mg 100 mL/hr over 30 Minutes Intravenous Every 8 hours 06/13/16 1438 06/16/16 0710   06/13/16 0930  clindamycin (CLEOCIN) IVPB 900 mg  Status:  Discontinued     900 mg 100 mL/hr over 30 Minutes Intravenous Every 8 hours 06/13/16 0929 06/13/16 1438   06/10/16 1015  imipenem-cilastatin (PRIMAXIN) 500 mg in sodium chloride 0.9 % 100 mL IVPB  Status:  Discontinued     500 mg 200 mL/hr over 30 Minutes Intravenous Every 8 hours 06/10/16 1011 06/13/16  0927   06/09/16 0930  imipenem-cilastatin (PRIMAXIN) 500 mg in sodium chloride 0.9 % 100 mL IVPB  Status:  Discontinued     500 mg 200 mL/hr over 30 Minutes Intravenous Every 12 hours 06/09/16 0741 06/10/16 1011   06/08/16 0600  imipenem-cilastatin (PRIMAXIN) 300 mg in sodium chloride 0.9 % 100 mL IVPB  Status:  Discontinued     300 mg 200 mL/hr over 30 Minutes Intravenous Every 6 hours 06/08/16 0519 06/09/16 0741      Medications   Scheduled Meds: . amLODipine  10 mg Oral Daily  . enoxaparin (LOVENOX) injection  30 mg Subcutaneous Q24H  . famotidine (PEPCID) IV  20 mg Intravenous Q24H  . insulin aspart  0-15 Units Subcutaneous Q4H  . insulin glargine  15 Units Subcutaneous Daily  . metoprolol tartrate  50 mg Oral BID  . piperacillin-tazobactam (ZOSYN)  IV  3.375 g Intravenous Q8H   Continuous Infusions: . Marland KitchenTPN (CLINIMIX-E) Adult 75 mL/hr at 06/15/16 1830  . lactated ringers 50 mL/hr at 06/16/16 0459   PRN Meds:.acetaminophen, diphenhydrAMINE, hydrALAZINE, ipratropium-albuterol, morphine injection, ondansetron **OR** ondansetron (ZOFRAN) IV, promethazine   Data Review:   Micro Results Recent Results (from the past 240 hour(s))  CULTURE, BLOOD (ROUTINE X 2) w Reflex to ID Panel     Status: None   Collection Time: 06/08/16  6:46 PM  Result Value Ref Range Status   Specimen Description BLOOD LEFT HAND  Final   Special Requests BOTTLES DRAWN AEROBIC AND ANAEROBIC 4ML  Final   Culture NO GROWTH 5 DAYS  Final   Report Status 06/13/2016 FINAL  Final  CULTURE, BLOOD (ROUTINE X 2) w Reflex to ID Panel     Status: None   Collection Time: 06/08/16  7:03 PM  Result Value Ref Range Status   Specimen  Description BLOOD LEFT HAND  Final   Special Requests BOTTLES DRAWN AEROBIC AND ANAEROBIC 5ML  Final   Culture NO GROWTH 5 DAYS  Final   Report Status 06/13/2016 FINAL  Final  Aerobic/Anaerobic Culture (surgical/deep wound)     Status: None   Collection Time: 06/09/16 10:02 AM  Result  Value Ref Range Status   Specimen Description ABDOMEN  Final   Special Requests NONE  Final   Gram Stain   Final    MODERATE WBC PRESENT,BOTH PMN AND MONONUCLEAR FEW GRAM NEGATIVE RODS RARE GRAM POSITIVE COCCI IN PAIRS RARE SQUAMOUS EPITHELIAL CELLS PRESENT    Culture   Final    FEW ESCHERICHIA COLI FEW BACTEROIDES FRAGILIS FEW BACTEROIDES THETAIOTAOMICRON BETA LACTAMASE POSITIVE Performed at [facility]    Report Status 06/13/2016 FINAL  Final   Organism ID, Bacteria ESCHERICHIA COLI  Final      Susceptibility   Escherichia coli - MIC*    AMPICILLIN 4 SENSITIVE Sensitive     CEFAZOLIN <=4 SENSITIVE Sensitive     CEFEPIME <=1 SENSITIVE Sensitive     CEFTAZIDIME <=1 SENSITIVE Sensitive     CEFTRIAXONE <=1 SENSITIVE Sensitive     CIPROFLOXACIN <=0.25 SENSITIVE Sensitive     GENTAMICIN <=1 SENSITIVE Sensitive     IMIPENEM <=0.25 SENSITIVE Sensitive     TRIMETH/SULFA <=20 SENSITIVE Sensitive     AMPICILLIN/SULBACTAM 4 SENSITIVE Sensitive     PIP/TAZO <=4 SENSITIVE Sensitive     Extended ESBL NEGATIVE Sensitive     * FEW ESCHERICHIA COLI    Radiology Reports Ct Abdomen Pelvis W Contrast  Result Date: 06/08/2016 CLINICAL DATA:  Urinary frequency for 2 weeks. Pelvic and suprapubic pain since yesterday. Low back pain. Nausea. EXAM: CT ABDOMEN AND PELVIS WITH CONTRAST TECHNIQUE: Multidetector CT imaging of the abdomen and pelvis was performed using the standard protocol following bolus administration of intravenous contrast. CONTRAST:  31mL ISOVUE-300 IOPAMIDOL (ISOVUE-300) INJECTION 61% COMPARISON:  None. FINDINGS: Dependent atelectasis in the lung bases. There is pneumoperitoneum with free intra-abdominal air throughout the abdomen and pelvis. Mild infiltration in the mesentery. Small amount of free fluid in the pelvis. Diverticula in the sigmoid colon with inflammatory stranding around the proximal sigmoid colon. Small pericolonic abscess measuring 1.4 x 2.7 cm. Changes are  consistent with perforated acute diverticulitis. No small or large bowel distention. Small esophageal hiatal hernia. The liver, spleen, gallbladder, pancreas, adrenal glands, kidneys, abdominal aorta, inferior vena cava, and retroperitoneal lymph nodes are unremarkable. Pelvis: Bladder wall is not thickened. No pelvic mass or lymphadenopathy. Appendix is normal. No destructive bone lesions. IMPRESSION: Perforated acute diverticulitis with diffuse free intraperitoneal air. Small pericolonic abscess adjacent to the junction of the descending and sigmoid colon. These results were called by telephone at the time of interpretation on 06/08/2016 at 3:07 am to Dr. Charlesetta Ivory , who verbally acknowledged these results. Electronically Signed   By: Lucienne Capers M.D.   On: 06/08/2016 03:28   Dg Chest Port 1 View  Result Date: 06/11/2016 CLINICAL DATA:  Onset of wheezing today; history of CHF, coronary artery disease, former smoker EXAM: PORTABLE CHEST 1 VIEW COMPARISON:  PA and lateral chest x-ray of June 09, 2016 FINDINGS: The lungs are adequately inflated. There is no focal infiltrate. There is no pleural effusion or pneumothorax. The cardiac silhouette remains enlarged. The pulmonary vascularity is slightly more conspicuous and mild interstitial edema on the right is suspected. The right internal jugular venous catheter tip projects over the midportion of the  SVC. The bony thorax exhibits no acute abnormality. IMPRESSION: Low-grade CHF with mild interstitial edema more conspicuous than on yesterday's study. No alveolar pneumonia. Electronically Signed   By: David  Martinique M.D.   On: 06/11/2016 08:45   Dg Chest Port 1 View  Result Date: 06/09/2016 CLINICAL DATA:  Central line placement EXAM: PORTABLE CHEST 1 VIEW COMPARISON:  11/05/2015 FINDINGS: Mild cardiomegaly. Mediastinal shadows are normal. Right internal jugular central line has its tip in the SVC just below the azygos level. No pneumothorax or  hemothorax. Lungs clear. IMPRESSION: Central line tip in the SVC just below the azygos level, 3 cm above the right atrium. No complication. Electronically Signed   By: Nelson Chimes M.D.   On: 06/09/2016 15:44   Dg Abd 2 Views  Result Date: 06/13/2016 CLINICAL DATA:  Status post repair of sigmoid diverticulitis. Nausea and vomiting. EXAM: ABDOMEN - 2 VIEW COMPARISON:  CT 06/08/2016 FINDINGS: Supine views of the abdomen and pelvis. No gross free intraperitoneal air. Left pelvic surgical clips with probable surgical drain within. Mid small bowel loops measure up to 4.0 cm. Contrast within normal caliber ascending colon. IMPRESSION: Mild small bowel dilatation within the mid abdomen, favoring adynamic ileus. Low-grade partial small bowel obstruction could look similar. Electronically Signed   By: Abigail Miyamoto M.D.   On: 06/13/2016 09:33   Dg Abd Acute W/chest  Result Date: 06/15/2016 CLINICAL DATA:  Status post sigmoid colectomy 5 days ago; history of CHF, coronary artery disease, former smoker, diabetes EXAM: DG ABDOMEN ACUTE W/ 1V CHEST COMPARISON:  Chest x-ray of June 11, 2016 and abdominal radiograph of June 13, 2016. FINDINGS: The lungs are adequately inflated. The interstitial markings are mildly prominent. There is no alveolar infiltrate. There is no large pleural effusion. The cardiac silhouette remains enlarged. The central pulmonary vascularity is engorged. A right internal jugular venous catheter tip projects over the proximal SVC. Within the abdomen there is contrast in the right colon as well as in the rectum. There are loops of mildly distended gas-filled small bowel to the left of midline. These are less conspicuous than on yesterday's study despite removal of the nasogastric tube. Vertical lower abdominal and pelvic skin staples are present with the drainage tube. There is calcification that projects over the lower pole of the right kidney. There may be calcifications overlying the left  kidney. The bony structures exhibit no acute abnormalities. IMPRESSION: Cardiomegaly with mild pulmonary vascular prominence consistent with low-grade CHF. No pneumonia or significant pleural effusion. Decreased gaseous distention of small-bowel loops. Electronically Signed   By: David  Martinique M.D.   On: 06/15/2016 07:43   Dg Abd Portable 1v  Result Date: 06/13/2016 CLINICAL DATA:  Readjustment of NG tube EXAM: PORTABLE ABDOMEN - 1 VIEW COMPARISON:  06/13/2016 FINDINGS: Distended small bowel loops are noted mid abdomen suspicious for small bowel obstruction. NG tube with tip in distal stomach. IMPRESSION: Distended small bowel loops mid abdomen again noted. NG tube with tip in distal stomach. Electronically Signed   By: Lahoma Crocker M.D.   On: 06/13/2016 12:07     CBC  Recent Labs Lab 06/12/16 0450 06/13/16 0418 06/14/16 0452 06/15/16 0445 06/16/16 0501  WBC 15.3* 15.5* 14.6* 15.1* 14.9*  HGB 8.8* 8.8* 8.1* 7.7* 7.6*  HCT 26.5* 25.8* 24.1* 22.3* 23.0*  PLT 227 245 282 284 305  MCV 88.1 86.0 86.7 86.0 87.0  MCH 29.1 29.3 29.3 29.8 28.9  MCHC 33.1 34.1 33.8 34.6 33.2  RDW 15.1* 15.1* 15.3* 15.1*  15.3*    Chemistries   Recent Labs Lab 06/11/16 0503 06/12/16 0450 06/13/16 0418 06/14/16 0452 06/15/16 0445 06/16/16 0501  NA 135 136 136 138 136 136  K 4.4 4.3 3.7 3.5 3.5 3.9  CL 108 110 107 109 108 107  CO2 20* 23 22 23 23 22   GLUCOSE 251* 239* 278* 102* 120* 155*  BUN 34* 33* 37* 47* 58* 64*  CREATININE 1.95* 1.63* 1.66* 1.70* 2.18* 2.39*  CALCIUM 8.6* 9.1 9.1 8.7* 8.3* 8.5*  MG 1.8 1.9 1.6* 1.9 1.8 1.8  AST 20  --   --   --   --  20  ALT 10*  --   --   --   --  14  ALKPHOS 67  --   --   --   --  62  BILITOT 0.7  --   --   --   --  0.3   ------------------------------------------------------------------------------------------------------------------ estimated creatinine clearance is 26 mL/min (by C-G formula based on SCr of 2.39  mg/dL). ------------------------------------------------------------------------------------------------------------------ No results for input(s): HGBA1C in the last 72 hours. ------------------------------------------------------------------------------------------------------------------ No results for input(s): CHOL, HDL, LDLCALC, TRIG, CHOLHDL, LDLDIRECT in the last 72 hours. ------------------------------------------------------------------------------------------------------------------ No results for input(s): TSH, T4TOTAL, T3FREE, THYROIDAB in the last 72 hours.  Invalid input(s): FREET3 ------------------------------------------------------------------------------------------------------------------ No results for input(s): VITAMINB12, FOLATE, FERRITIN, TIBC, IRON, RETICCTPCT in the last 72 hours.  Coagulation profile No results for input(s): INR, PROTIME in the last 168 hours.  No results for input(s): DDIMER in the last 72 hours.  Cardiac Enzymes No results for input(s): CKMB, TROPONINI, MYOGLOBIN in the last 168 hours.  Invalid input(s): CK ------------------------------------------------------------------------------------------------------------------ Invalid input(s): [name]   Keren Cullers  is a 70 y.o. female with a known history of Hypertension, insulin-dependent diabetes mellitus, CK D, rheumatoid arthritis, hypothyroidism presents to the hospital secondary to worsening abdominal pain and noted to have perforated sigmoid diverticulitis.  * # Acute kidney injury over CKD stage III - worsening Started on LR. Continue I/Os Nephrology consulted. Appreciate input Discussed with Dr. Juleen China  # Essential hypertension -  IV lopressor stop and start PO meds. Lopressor and Norvasc. - Continue to hold hydrochlorothiazide. Discontinued lisinopril due to worsening BUN/Cr. - IV hydralazine as needed.  # Insulin-dependent diabetes mellitus - well  controlled Patient is getting insulin to TPN, Lantus and scheduled NovoLog. Sliding scale insulin with Accu-Cheks every 4 hours  # Mild Acute on chronic chronic diastolic CHF  Resolved  # Rheumatoid arthritis-follows with rheumatology.  # Perforated sigmoid status post surgery On IV zosyn. WBC still high NG tube removed  # Acute blood loss anemia over AOCD Monitor Transfuse if < 7     Code Status Orders        Start     Ordered   06/08/16 0631  Full code  Continuous     06/08/16 0630    Code Status History    Date Active Date Inactive Code Status Order ID Comments User Context   11/05/2015 10:26 AM 11/10/2015  5:22 PM Full Code [id number]  Saundra Shelling, MD ED   11/05/2015  7:14 AM 11/05/2015 10:26 AM Full Code [id number]  Saundra Shelling, MD ED     DVT Prophylaxis SCDs  Lab Results  Component Value Date   PLT 305 06/16/2016   Time Spent in minutes 25 minute   Hillary Bow R M.D on 06/16/2016 at 11:52 AM  Between 7am to 6pm - Pager - [phone]  After  6pm go to [email or web address] - password [email or web address] [facility]   Office  [phone]

## 2016-06-16 NOTE — Progress Notes (Signed)
PARENTERAL NUTRITION CONSULT NOTE - INITIAL  Pharmacy Consult for TPN Indication: colon resection, NPO  Allergies  Allergen Reactions  . Doxycycline Other (See Comments)    Reaction: unknown  . Levaquin [Levofloxacin] Other (See Comments)    Causes joint pain  . Penicillins Rash and Other (See Comments)    Has patient had a PCN reaction causing immediate rash, facial/tongue/throat swelling, SOB or lightheadedness with hypotension: Yes Has patient had a PCN reaction causing severe rash involving mucus membranes or skin necrosis: No Has patient had a PCN reaction that required hospitalization No Has patient had a PCN reaction occurring within the last 10 years: No If all of the above answers are "NO", then may proceed with Cephalosporin use.  . Sulfa Antibiotics Swelling and Rash    Patient Measurements: Height: 5\' 5"  (165.1 cm) Weight: 226 lb 3.2 oz (102.6 kg) IBW/kg (Calculated) : 57   Vital Signs: Temp: 97.9 F (36.6 C) (08/23 1240) Temp Source: Oral (08/23 0641) BP: 133/56 (08/23 1240) Pulse Rate: 81 (08/23 1240) Intake/Output from previous day: 08/22 0701 - 08/23 0700 In: 2813 [I.V.:2724; IV Piggyback:89] Out: 2275 [Urine:1050; Stool:1225] Intake/Output from this shift: Total I/O In: 825.9 [I.V.:773; IV Piggyback:52.9] Out: 700 [Urine:700]  Labs:  Recent Labs  06/14/16 0452 06/15/16 0445 06/16/16 0501  WBC 14.6* 15.1* 14.9*  HGB 8.1* 7.7* 7.6*  HCT 24.1* 22.3* 23.0*  PLT 282 284 305     Recent Labs  06/14/16 0452 06/15/16 0445 06/16/16 0501  NA 138 136 136  K 3.5 3.5 3.9  CL 109 108 107  CO2 23 23 22   GLUCOSE 102* 120* 155*  BUN 47* 58* 64*  CREATININE 1.70* 2.18* 2.39*  CALCIUM 8.7* 8.3* 8.5*  MG 1.9 1.8 1.8  PHOS  --  3.9 4.5  PROT  --   --  6.1*  ALBUMIN  --   --  2.3*  AST  --   --  20  ALT  --   --  14  ALKPHOS  --   --  62  BILITOT  --   --  0.3   Estimated Creatinine Clearance: 26 mL/min (by C-G formula based on SCr of 2.39  mg/dL).    Recent Labs  06/16/16 0440 06/16/16 0750 06/16/16 1135  GLUCAP 165* 146* 144*    Insulin Requirements in the past 24 hours:  66 units.   Pt on Lantus at home  Current Nutrition:  Clinimix E 5/20 with trace elements and MVI at 40 ml/hr   Assessment: 70 yo female s/p colon resection surgery for diverticulitis on 8/16 started TPN 8/17 All electrolytes are within normal limits.  Plan:   Will decrease insulin in TPN to 20 units/day. Will continue moderate scale SSI. Will continue Lantus 15 units daily.    Pharmacy will continue to monitor and adjust per consult.   Lenis Noon, PharmD 06/16/2016,3:36 PM

## 2016-06-16 NOTE — Progress Notes (Signed)
PT Cancellation Note  Patient Details Name: [name] MRN: [id number] DOB: 15-Sep-1946   Cancelled Treatment:    Reason Eval/Treat Not Completed: Fatigue/lethargy limiting ability to participate   Upon entering room, pt was getting ready to return to bed.  Stating she had been up for a while and "I've had enough".  Offered supine exercises but she declined.     Chesley Noon 06/16/2016, 11:06 AM

## 2016-06-16 NOTE — Progress Notes (Signed)
[facility]  ROUNDING NOTE   Subjective:   Niece at bedside. Patient states that she is doing better.  Creatinine 2.39 (2.18) UOP 1050 On TPN and LR at 43mL/hr  Objective:  Vital signs in last 24 hours:  Temp:  [97.9 F (36.6 C)-98.4 F (36.9 C)] 97.9 F (36.6 C) (08/23 1240) Pulse Rate:  [81-89] 81 (08/23 1240) Resp:  [18-20] 18 (08/23 1240) BP: (133-142)/(47-56) 133/56 (08/23 1240) SpO2:  [99 %-100 %] 100 % (08/23 1240) Weight:  [102.6 kg (226 lb 3.2 oz)] 102.6 kg (226 lb 3.2 oz) (08/23 0500)  Weight change: -0.816 kg (-1 lb 12.8 oz) Filed Weights   06/14/16 0445 06/15/16 0443 06/16/16 0500  Weight: 102.8 kg (226 lb 11.2 oz) 103.4 kg (228 lb) 102.6 kg (226 lb 3.2 oz)    Intake/Output: I/O last 3 completed shifts: In: [id number] [I.V.:4344; IV Piggyback:171] Out: [id number] [Urine:1550; Stool:2000]   Intake/Output this shift:  Total I/O In: 825.9 [I.V.:773; IV Piggyback:52.9] Out: 400 [Urine:400]  Physical Exam: General: NAD, sitting in chair  Head: Normocephalic, atraumatic. Moist oral mucosal membranes  Eyes: Anicteric, PERRL  Neck: Supple, trachea midline  Lungs:  Clear to auscultation  Heart: Regular rate and rhythm  Abdomen:  +tender , midline incision with staples, +ostomy, +bowel sounds  Extremities:  + peripheral edema.  Neurologic: Nonfocal, moving all four extremities  Skin: No lesions       Basic Metabolic Panel:  Recent Labs Lab 06/11/16 0503 06/12/16 0450 06/13/16 0418 06/14/16 0452 06/15/16 0445 06/16/16 0501  NA 135 136 136 138 136 136  K 4.4 4.3 3.7 3.5 3.5 3.9  CL 108 110 107 109 108 107  CO2 20* 23 22 23 23 22   GLUCOSE 251* 239* 278* 102* 120* 155*  BUN 34* 33* 37* 47* 58* 64*  CREATININE 1.95* 1.63* 1.66* 1.70* 2.18* 2.39*  CALCIUM 8.6* 9.1 9.1 8.7* 8.3* 8.5*  MG 1.8 1.9 1.6* 1.9 1.8 1.8  PHOS 2.1* 1.4* 3.1  --  3.9 4.5    Liver Function Tests:  Recent Labs Lab 06/11/16 0503 06/16/16 0501  AST 20 20  ALT 10* 14   ALKPHOS 67 62  BILITOT 0.7 0.3  PROT 6.2* 6.1*  ALBUMIN 2.8* 2.3*   No results for input(s): LIPASE, AMYLASE in the last 168 hours. No results for input(s): AMMONIA in the last 168 hours.  CBC:  Recent Labs Lab 06/12/16 0450 06/13/16 0418 06/14/16 0452 06/15/16 0445 06/16/16 0501  WBC 15.3* 15.5* 14.6* 15.1* 14.9*  HGB 8.8* 8.8* 8.1* 7.7* 7.6*  HCT 26.5* 25.8* 24.1* 22.3* 23.0*  MCV 88.1 86.0 86.7 86.0 87.0  PLT 227 245 282 284 305    Cardiac Enzymes: No results for input(s): CKTOTAL, CKMB, CKMBINDEX, TROPONINI in the last 168 hours.  BNP: Invalid input(s): POCBNP  CBG:  Recent Labs Lab 06/15/16 2047 06/16/16 0001 06/16/16 0440 06/16/16 0750 06/16/16 1135  GLUCAP 124* 117* 165* 146* 144*    Microbiology: Results for orders placed or performed during the hospital encounter of 06/08/16  CULTURE, BLOOD (ROUTINE X 2) w Reflex to ID Panel     Status: None   Collection Time: 06/08/16  6:46 PM  Result Value Ref Range Status   Specimen Description BLOOD LEFT HAND  Final   Special Requests BOTTLES DRAWN AEROBIC AND ANAEROBIC 4ML  Final   Culture NO GROWTH 5 DAYS  Final   Report Status 06/13/2016 FINAL  Final  CULTURE, BLOOD (ROUTINE X 2) w Reflex to ID Panel  Status: None   Collection Time: 06/08/16  7:03 PM  Result Value Ref Range Status   Specimen Description BLOOD LEFT HAND  Final   Special Requests BOTTLES DRAWN AEROBIC AND ANAEROBIC 5ML  Final   Culture NO GROWTH 5 DAYS  Final   Report Status 06/13/2016 FINAL  Final  Aerobic/Anaerobic Culture (surgical/deep wound)     Status: None   Collection Time: 06/09/16 10:02 AM  Result Value Ref Range Status   Specimen Description ABDOMEN  Final   Special Requests NONE  Final   Gram Stain   Final    MODERATE WBC PRESENT,BOTH PMN AND MONONUCLEAR FEW GRAM NEGATIVE RODS RARE GRAM POSITIVE COCCI IN PAIRS RARE SQUAMOUS EPITHELIAL CELLS PRESENT    Culture   Final    FEW ESCHERICHIA COLI FEW BACTEROIDES  FRAGILIS FEW BACTEROIDES THETAIOTAOMICRON BETA LACTAMASE POSITIVE Performed at [facility]    Report Status 06/13/2016 FINAL  Final   Organism ID, Bacteria ESCHERICHIA COLI  Final      Susceptibility   Escherichia coli - MIC*    AMPICILLIN 4 SENSITIVE Sensitive     CEFAZOLIN <=4 SENSITIVE Sensitive     CEFEPIME <=1 SENSITIVE Sensitive     CEFTAZIDIME <=1 SENSITIVE Sensitive     CEFTRIAXONE <=1 SENSITIVE Sensitive     CIPROFLOXACIN <=0.25 SENSITIVE Sensitive     GENTAMICIN <=1 SENSITIVE Sensitive     IMIPENEM <=0.25 SENSITIVE Sensitive     TRIMETH/SULFA <=20 SENSITIVE Sensitive     AMPICILLIN/SULBACTAM 4 SENSITIVE Sensitive     PIP/TAZO <=4 SENSITIVE Sensitive     Extended ESBL NEGATIVE Sensitive     * FEW ESCHERICHIA COLI    Coagulation Studies: No results for input(s): LABPROT, INR in the last 72 hours.  Urinalysis: No results for input(s): COLORURINE, LABSPEC, PHURINE, GLUCOSEU, HGBUR, BILIRUBINUR, KETONESUR, PROTEINUR, UROBILINOGEN, NITRITE, LEUKOCYTESUR in the last 72 hours.  Invalid input(s): APPERANCEUR    Imaging: Dg Abd Acute W/chest  Result Date: 06/15/2016 CLINICAL DATA:  Status post sigmoid colectomy 5 days ago; history of CHF, coronary artery disease, former smoker, diabetes EXAM: DG ABDOMEN ACUTE W/ 1V CHEST COMPARISON:  Chest x-ray of June 11, 2016 and abdominal radiograph of June 13, 2016. FINDINGS: The lungs are adequately inflated. The interstitial markings are mildly prominent. There is no alveolar infiltrate. There is no large pleural effusion. The cardiac silhouette remains enlarged. The central pulmonary vascularity is engorged. A right internal jugular venous catheter tip projects over the proximal SVC. Within the abdomen there is contrast in the right colon as well as in the rectum. There are loops of mildly distended gas-filled small bowel to the left of midline. These are less conspicuous than on yesterday's study despite removal of the  nasogastric tube. Vertical lower abdominal and pelvic skin staples are present with the drainage tube. There is calcification that projects over the lower pole of the right kidney. There may be calcifications overlying the left kidney. The bony structures exhibit no acute abnormalities. IMPRESSION: Cardiomegaly with mild pulmonary vascular prominence consistent with low-grade CHF. No pneumonia or significant pleural effusion. Decreased gaseous distention of small-bowel loops. Electronically Signed   By: David  Martinique M.D.   On: 06/15/2016 07:43     Medications:   . Marland KitchenTPN (CLINIMIX-E) Adult 75 mL/hr at 06/15/16 1830  . lactated ringers 50 mL/hr at 06/16/16 0459   . amLODipine  10 mg Oral Daily  . diatrizoate meglumine-sodium  15 mL Oral Q1 Hr x 2  . enoxaparin (LOVENOX) injection  30 mg Subcutaneous Q24H  . famotidine (PEPCID) IV  20 mg Intravenous Q24H  . insulin aspart  0-15 Units Subcutaneous Q4H  . insulin glargine  15 Units Subcutaneous Daily  . metoprolol tartrate  50 mg Oral BID  . piperacillin-tazobactam (ZOSYN)  IV  3.375 g Intravenous Q8H   acetaminophen, diphenhydrAMINE, hydrALAZINE, ipratropium-albuterol, morphine injection, ondansetron **OR** ondansetron (ZOFRAN) IV, promethazine  Assessment/ Plan:  [name] is a 70 y.o. black female with hypertension, diabetes mellitus type II, hyperlipidemia, coronary artery disease, hypothyroidism, temporal arteritis, osteoarthritis. Admitted on 06/08/2016 for sigmoid diverticulitis status post colectomy  1. Acute Renal Failure on chronic kidney disease stage IV with proteinuria: baseline creatinine of 1.8-2.2. Chronic kidney disease secondary to diabetic nephropathy.  Acute renal failure seems to be prerenal in nature with no PO intake. Currently on IV fluids and TPN.  - Agree with IV fluids: LR at 68mL/hr - Continue TPN, electrolytes are stable.  - Renally dose all medications.   2. Hypertension: at goal. With peripheral edema,  most likely due to hypoalbuminemia. IV albumin administered today 8/22 - amlodipine. Holding home lisinopril and hydrochlorothiazide  3. Diabetes Mellitus type II with chronic kidney disease: insulin dependent. Hemoglobin A1c of 8.6% on 02/24/16.  - Continue glucose control.     LOS: [place], Marquice Uddin 8/23/20172:48 PM

## 2016-06-16 NOTE — Progress Notes (Signed)
CT tech notified that pt has 150 left in the bottle to drink. Okay to still come down for scan.

## 2016-06-16 NOTE — Progress Notes (Addendum)
Nutrition Follow-up  DOCUMENTATION CODES:   Not applicable  INTERVENTION:  -Spoke with Dr. Dahlia Byes and wanting TPN tapered down today.  Recommend 5%AA/20% dextrose with MVI and trace elements at 81ml/hr today.  Spoke with pharmacy.   -Will follow-up in am with diet education. -Recommend soft diet at this time.   NUTRITION DIAGNOSIS:   Inadequate oral intake related to altered GI function as evidenced by NPO status.    GOAL:   Patient will meet greater than or equal to 90% of their needs    MONITOR:   Diet advancement (TPN)  REASON FOR ASSESSMENT:   Consult New TPN/TNA  ASSESSMENT:    TPN continues at 63ml/hr  Labs reviewed:BUN 64, creatinine 2.39, glucose 155 Medications reviewed:  Ileostomy output noted 122ml last 24 hr   Diet Order:  .TPN (CLINIMIX-E) Adult DIET SOFT Room service appropriate? Yes; Fluid consistency: Thin  Skin:  Reviewed, no issues  Last BM:  8/22 1425 mL stool via ileostomy in past 24 hours  Height:   Ht Readings from Last 1 Encounters:  06/08/16 5\' 5"  (1.651 m)    Weight:   Wt Readings from Last 1 Encounters:  06/16/16 226 lb 3.2 oz (102.6 kg)    Ideal Body Weight:     BMI:  Body mass index is 37.64 kg/m.  Estimated Nutritional Needs:   Kcal:  1500-1900 kcals/d  Protein:  75-95 g/d  Fluid:  >/= 1526ml/d  EDUCATION NEEDS:   No education needs identified at this time  Lakya Schrupp B. Zenia Resides, [place], [place] (pager) Weekend/On-Call pager [phone])

## 2016-06-16 NOTE — Plan of Care (Signed)
Problem: Nutrition: Goal: Adequate nutrition will be maintained Outcome: Progressing Pt is eating a heart healthy diet now.

## 2016-06-16 NOTE — Progress Notes (Signed)
POD # 8 for sigmoid colectomy and loops ileostomy for perf diverticulitis Another bit better. Good output from the ileostomy. Creatinine is still high as well as her white count. She remains afebrile Tolerating by mouth  PE NAD Abd; tape was in place. No evidence of wound infection Penrose's with some thick discharge. No peritonitis.  A/P persistent elevated white count and we will order a CT of the abdomen and pelvis to rule out any intra-abdominal collection. We will continue the Zosyn for now and DC clindamycin eat she did improve Escherichia coli. No need for immediate surgical intervention. I appreciate nephrology and internal medicine assistance.

## 2016-06-17 LAB — BASIC METABOLIC PANEL
Anion gap: 9 (ref 5–15)
BUN: 66 mg/dL — AB (ref 6–20)
CO2: 20 mmol/L — ABNORMAL LOW (ref 22–32)
CREATININE: 2.47 mg/dL — AB (ref 0.44–1.00)
Calcium: 8.4 mg/dL — ABNORMAL LOW (ref 8.9–10.3)
Chloride: 106 mmol/L (ref 101–111)
GFR calc Af Amer: 22 mL/min — ABNORMAL LOW (ref >60)
GFR, EST NON AFRICAN AMERICAN: 19 mL/min — AB (ref >60)
GLUCOSE: 133 mg/dL — AB (ref 65–99)
POTASSIUM: 4.1 mmol/L (ref 3.5–5.1)
Sodium: 135 mmol/L (ref 135–145)

## 2016-06-17 LAB — MAGNESIUM: Magnesium: 2.1 mg/dL (ref 1.7–2.4)

## 2016-06-17 LAB — GLUCOSE, CAPILLARY
GLUCOSE-CAPILLARY: 131 mg/dL — AB (ref 65–99)
GLUCOSE-CAPILLARY: 102 mg/dL — AB (ref 65–99)
GLUCOSE-CAPILLARY: 148 mg/dL — AB (ref 65–99)
Glucose-Capillary: 160 mg/dL — ABNORMAL HIGH (ref 65–99)
Glucose-Capillary: 260 mg/dL — ABNORMAL HIGH (ref 65–99)
Glucose-Capillary: 193 mg/dL — ABNORMAL HIGH (ref 65–99)

## 2016-06-17 LAB — CBC
HCT: 22.6 % — ABNORMAL LOW (ref 35.0–47.0)
Hemoglobin: 7.6 g/dL — ABNORMAL LOW (ref 12.0–16.0)
MCH: 29.2 pg (ref 26.0–34.0)
MCHC: 33.7 g/dL (ref 32.0–36.0)
MCV: 86.6 fL (ref 80.0–100.0)
PLATELETS: 328 10*3/uL (ref 150–440)
RBC: 2.61 MIL/uL — AB (ref 3.80–5.20)
RDW: 15 % — ABNORMAL HIGH (ref 11.5–14.5)
WBC: 15.5 10*3/uL — ABNORMAL HIGH (ref 3.6–11.0)

## 2016-06-17 MED ORDER — HEPARIN SODIUM (PORCINE) 5000 UNIT/ML IJ SOLN
5000.0000 [IU] | Freq: Three times a day (TID) | INTRAMUSCULAR | Status: DC
  Administered 2016-06-18 – 2016-06-22 (×13): 5000 [IU] via SUBCUTANEOUS
  Filled 2016-06-17 (×13): qty 1

## 2016-06-17 MED ORDER — INSULIN REGULAR HUMAN 100 UNIT/ML IJ SOLN
INTRAMUSCULAR | Status: AC
  Administered 2016-06-17 – 2016-06-18 (×3): via INTRAVENOUS
  Filled 2016-06-17: qty 960

## 2016-06-17 MED ORDER — AMLODIPINE BESYLATE 5 MG PO TABS
2.5000 mg | ORAL_TABLET | Freq: Every day | ORAL | Status: DC
Start: 2016-06-17 — End: 2016-06-22
  Administered 2016-06-17 – 2016-06-22 (×6): 2.5 mg via ORAL
  Filled 2016-06-17 (×6): qty 1

## 2016-06-17 NOTE — Consult Note (Signed)
[place] at [facility]                                                                                                                                                                                            Patient Demographics   [name], is a 70 y.o. female, DOB - [birth year], [id number]  Admit date - 06/08/2016   Admitting Physician Clayburn Pert, MD  Outpatient Primary MD for the patient is Singh,[name], MD   LOS - 9  Subjective   NGT out . Feels better. Afebrile No shortness of breath  Review of Systems:   CONSTITUTIONAL: No documented fever. Positive for fatigue and weakness. No weight gain, no weight loss.  EYES: No blurry or double vision.  ENT: No tinnitus. No postnasal drip. No redness of the oropharynx.  RESPIRATORY: No cough, Positive wheeze, no hemoptysis. No dyspnea.  CARDIOVASCULAR: No chest pain. No orthopnea. No palpitations. No syncope.  GASTROINTESTINAL: Nausea. No vomiting. Abdominal pain present. GENITOURINARY: No dysuria or hematuria.  ENDOCRINE: No polyuria or nocturia. No heat or cold intolerance.  HEMATOLOGY: No anemia. No bruising. No bleeding.  INTEGUMENTARY: No rashes. No lesions.  MUSCULOSKELETAL: No arthritis. No swelling. No gout.  NEUROLOGIC: No numbness, tingling, or ataxia. No seizure-type activity.  PSYCHIATRIC: No anxiety. No insomnia. No ADD.   Vitals:   Vitals:   06/16/16 2056 06/17/16 0504 06/17/16 0556 06/17/16 0900  BP: (!) 140/59  (!) 117/52 (!) 116/48  Pulse: 83  81 85  Resp: 18  20   Temp: 98.3 F (36.8 C)  98.1 F (36.7 C)   TempSrc: Oral  Oral   SpO2: 100%  100%   Weight:  102.5 kg (225 lb 14.4 oz)    Height:        Wt Readings from Last 3 Encounters:  06/17/16 102.5 kg (225 lb 14.4 oz)  02/17/16 99.3 kg (219 lb)  11/05/15 97.7 kg (215 lb 5 oz)     Intake/Output Summary (Last 24 hours) at 06/17/16 1220 Last data filed at 06/17/16 1100  Gross per 24 hour  Intake           2428.85 ml  Output             3625 ml  Net         -1196.15 ml    Physical Exam:   GENERAL: Pleasant-appearing in no apparent distress.  HEAD, EYES, EARS, NOSE AND THROAT: Atraumatic, normocephalic. Extraocular muscles are intact. Pupils equal and reactive to light. Sclerae anicteric. No conjunctival injection. No oro-pharyngeal erythema.  NECK: Supple. There is no jugular venous distention.  No bruits, no lymphadenopathy, no thyromegaly.  HEART: Regular rate and rhythm,. No murmurs, no rubs, no clicks.  LUNGS: Clear to auscultation bilaterally. No rales or rhonchi. No wheezes.  ABDOMEN: Postop. Dressing. Some ostomy output.  Bowel sounds Normal EXTREMITIES: No evidence of any cyanosis, clubbing, or peripheral edema.  +2 pedal and radial pulses bilaterally.  NEUROLOGIC: The patient is alert, awake, and oriented x3 with no focal motor or sensory deficits appreciated bilaterally.  SKIN: Moist and warm with no rashes appreciated.  Psych: Not anxious, depressed LN: No inguinal LN enlargement    Antibiotics   Anti-infectives    Start     Dose/Rate Route Frequency Ordered Stop   06/15/16 2200  piperacillin-tazobactam (ZOSYN) IVPB 3.375 g     3.375 g 12.5 mL/hr over 240 Minutes Intravenous Every 8 hours 06/15/16 1906     06/15/16 1845  cefTRIAXone (ROCEPHIN) 1 g in dextrose 5 % 50 mL IVPB  Status:  Discontinued     1 g 100 mL/hr over 30 Minutes Intravenous Every 24 hours 06/15/16 1844 06/15/16 1858   06/13/16 1930  clindamycin (CLEOCIN) IVPB 900 mg  Status:  Discontinued     900 mg 100 mL/hr over 30 Minutes Intravenous Every 8 hours 06/13/16 1438 06/16/16 0710   06/13/16 0930  clindamycin (CLEOCIN) IVPB 900 mg  Status:  Discontinued     900 mg 100 mL/hr over 30 Minutes Intravenous Every 8 hours 06/13/16 0929 06/13/16 1438   06/10/16 1015  imipenem-cilastatin (PRIMAXIN) 500 mg in sodium chloride 0.9 % 100 mL IVPB  Status:  Discontinued     500 mg 200 mL/hr over 30 Minutes  Intravenous Every 8 hours 06/10/16 1011 06/13/16 0927   06/09/16 0930  imipenem-cilastatin (PRIMAXIN) 500 mg in sodium chloride 0.9 % 100 mL IVPB  Status:  Discontinued     500 mg 200 mL/hr over 30 Minutes Intravenous Every 12 hours 06/09/16 0741 06/10/16 1011   06/08/16 0600  imipenem-cilastatin (PRIMAXIN) 300 mg in sodium chloride 0.9 % 100 mL IVPB  Status:  Discontinued     300 mg 200 mL/hr over 30 Minutes Intravenous Every 6 hours 06/08/16 0519 06/09/16 0741      Medications   Scheduled Meds: . amLODipine  2.5 mg Oral Daily  . famotidine (PEPCID) IV  20 mg Intravenous Q24H  . [START ON 06/18/2016] heparin subcutaneous  5,000 Units Subcutaneous Q8H  . insulin aspart  0-15 Units Subcutaneous Q4H  . insulin glargine  15 Units Subcutaneous Daily  . metoprolol tartrate  50 mg Oral BID  . piperacillin-tazobactam (ZOSYN)  IV  3.375 g Intravenous Q8H   Continuous Infusions: . Marland KitchenTPN (CLINIMIX-E) Adult 40 mL/hr at 06/16/16 1928  . lactated ringers 75 mL/hr at 06/17/16 0717   PRN Meds:.acetaminophen, diphenhydrAMINE, hydrALAZINE, ipratropium-albuterol, morphine injection, ondansetron **OR** ondansetron (ZOFRAN) IV, promethazine   Data Review:   Micro Results Recent Results (from the past 240 hour(s))  CULTURE, BLOOD (ROUTINE X 2) w Reflex to ID Panel     Status: None   Collection Time: 06/08/16  6:46 PM  Result Value Ref Range Status   Specimen Description BLOOD LEFT HAND  Final   Special Requests BOTTLES DRAWN AEROBIC AND ANAEROBIC 4ML  Final   Culture NO GROWTH 5 DAYS  Final   Report Status 06/13/2016 FINAL  Final  CULTURE, BLOOD (ROUTINE X 2) w Reflex to ID Panel     Status: None   Collection Time: 06/08/16  7:03 PM  Result Value Ref Range Status  Specimen Description BLOOD LEFT HAND  Final   Special Requests BOTTLES DRAWN AEROBIC AND ANAEROBIC 5ML  Final   Culture NO GROWTH 5 DAYS  Final   Report Status 06/13/2016 FINAL  Final  Aerobic/Anaerobic Culture (surgical/deep wound)      Status: None   Collection Time: 06/09/16 10:02 AM  Result Value Ref Range Status   Specimen Description ABDOMEN  Final   Special Requests NONE  Final   Gram Stain   Final    MODERATE WBC PRESENT,BOTH PMN AND MONONUCLEAR FEW GRAM NEGATIVE RODS RARE GRAM POSITIVE COCCI IN PAIRS RARE SQUAMOUS EPITHELIAL CELLS PRESENT    Culture   Final    FEW ESCHERICHIA COLI FEW BACTEROIDES FRAGILIS FEW BACTEROIDES THETAIOTAOMICRON BETA LACTAMASE POSITIVE Performed at [facility]    Report Status 06/13/2016 FINAL  Final   Organism ID, Bacteria ESCHERICHIA COLI  Final      Susceptibility   Escherichia coli - MIC*    AMPICILLIN 4 SENSITIVE Sensitive     CEFAZOLIN <=4 SENSITIVE Sensitive     CEFEPIME <=1 SENSITIVE Sensitive     CEFTAZIDIME <=1 SENSITIVE Sensitive     CEFTRIAXONE <=1 SENSITIVE Sensitive     CIPROFLOXACIN <=0.25 SENSITIVE Sensitive     GENTAMICIN <=1 SENSITIVE Sensitive     IMIPENEM <=0.25 SENSITIVE Sensitive     TRIMETH/SULFA <=20 SENSITIVE Sensitive     AMPICILLIN/SULBACTAM 4 SENSITIVE Sensitive     PIP/TAZO <=4 SENSITIVE Sensitive     Extended ESBL NEGATIVE Sensitive     * FEW ESCHERICHIA COLI    Radiology Reports Ct Abdomen Pelvis Wo Contrast  Result Date: 06/16/2016 CLINICAL DATA:  Persistent elevated white blood cell count. Postoperative day number gait after sigmoid colectomy and end of loop ileostomy for perforated diverticulitis. The patient is afebrile. EXAM: CT ABDOMEN AND PELVIS WITHOUT CONTRAST TECHNIQUE: Multidetector CT imaging of the abdomen and pelvis was performed following the standard protocol without IV contrast. COMPARISON:  CT abdomen and pelvis 06/08/2016. FINDINGS: The lung bases are clear. There is cardiomegaly. No pleural or pericardial effusion. Small hiatal hernia is noted. Calcific coronary artery disease is seen. Since the prior CT scan, the patient has undergone partial sigmoid colectomy and loop ileostomy placement. In the anterior aspect  of the left pelvis immediately subjacent to the anterior abdominal wall musculature at the surgical site, there is a small fluid collection measuring 1.9 cm AP x 4.0 cm transverse x 4.2 cm craniocaudal. There is also small amount of free pelvic fluid and fluid about small bowel loops in the right lower quadrant. The left ovary is again identified and appears more prominent. There may be a small amount of fluid adjacent to the ovary. Small bowel loops are mildly dilated at 3.0 cm with some air-fluid levels present. Contrast extends into the patient's ileostomy. High attenuation material within the gallbladder is consistent with vicarious excretion of IV contrast from the patient's prior CT. The liver, adrenal glands, spleen, pancreas and kidneys are unremarkable. No lymphadenopathy is identified. No focal bony abnormality is seen. IMPRESSION: Status post partial sigmoid colectomy and loop ileostomy placement. Small fluid collection in the anterior left lower quadrant at the surgical site could be due to normal postoperative change, phlegmon or possibly early abscess. No drainable collection is identified. Increased fullness of the left ovary may be due to small amount of adjacent fluid with a small volume of free pelvic fluid noted. Mild dilatation of small bowel with air-fluid levels compatible with ileus. No obstruction. Calcific coronary  artery disease. Small hiatal hernia. Electronically Signed   By: Inge Rise M.D.   On: 06/16/2016 17:47   Ct Abdomen Pelvis W Contrast  Result Date: 06/08/2016 CLINICAL DATA:  Urinary frequency for 2 weeks. Pelvic and suprapubic pain since yesterday. Low back pain. Nausea. EXAM: CT ABDOMEN AND PELVIS WITH CONTRAST TECHNIQUE: Multidetector CT imaging of the abdomen and pelvis was performed using the standard protocol following bolus administration of intravenous contrast. CONTRAST:  68mL ISOVUE-300 IOPAMIDOL (ISOVUE-300) INJECTION 61% COMPARISON:  None. FINDINGS:  Dependent atelectasis in the lung bases. There is pneumoperitoneum with free intra-abdominal air throughout the abdomen and pelvis. Mild infiltration in the mesentery. Small amount of free fluid in the pelvis. Diverticula in the sigmoid colon with inflammatory stranding around the proximal sigmoid colon. Small pericolonic abscess measuring 1.4 x 2.7 cm. Changes are consistent with perforated acute diverticulitis. No small or large bowel distention. Small esophageal hiatal hernia. The liver, spleen, gallbladder, pancreas, adrenal glands, kidneys, abdominal aorta, inferior vena cava, and retroperitoneal lymph nodes are unremarkable. Pelvis: Bladder wall is not thickened. No pelvic mass or lymphadenopathy. Appendix is normal. No destructive bone lesions. IMPRESSION: Perforated acute diverticulitis with diffuse free intraperitoneal air. Small pericolonic abscess adjacent to the junction of the descending and sigmoid colon. These results were called by telephone at the time of interpretation on 06/08/2016 at 3:07 am to Dr. Charlesetta Ivory , who verbally acknowledged these results. Electronically Signed   By: Lucienne Capers M.D.   On: 06/08/2016 03:28   Dg Chest Port 1 View  Result Date: 06/11/2016 CLINICAL DATA:  Onset of wheezing today; history of CHF, coronary artery disease, former smoker EXAM: PORTABLE CHEST 1 VIEW COMPARISON:  PA and lateral chest x-ray of June 09, 2016 FINDINGS: The lungs are adequately inflated. There is no focal infiltrate. There is no pleural effusion or pneumothorax. The cardiac silhouette remains enlarged. The pulmonary vascularity is slightly more conspicuous and mild interstitial edema on the right is suspected. The right internal jugular venous catheter tip projects over the midportion of the SVC. The bony thorax exhibits no acute abnormality. IMPRESSION: Low-grade CHF with mild interstitial edema more conspicuous than on yesterday's study. No alveolar pneumonia. Electronically  Signed   By: David  Martinique M.D.   On: 06/11/2016 08:45   Dg Chest Port 1 View  Result Date: 06/09/2016 CLINICAL DATA:  Central line placement EXAM: PORTABLE CHEST 1 VIEW COMPARISON:  11/05/2015 FINDINGS: Mild cardiomegaly. Mediastinal shadows are normal. Right internal jugular central line has its tip in the SVC just below the azygos level. No pneumothorax or hemothorax. Lungs clear. IMPRESSION: Central line tip in the SVC just below the azygos level, 3 cm above the right atrium. No complication. Electronically Signed   By: Nelson Chimes M.D.   On: 06/09/2016 15:44   Dg Abd 2 Views  Result Date: 06/13/2016 CLINICAL DATA:  Status post repair of sigmoid diverticulitis. Nausea and vomiting. EXAM: ABDOMEN - 2 VIEW COMPARISON:  CT 06/08/2016 FINDINGS: Supine views of the abdomen and pelvis. No gross free intraperitoneal air. Left pelvic surgical clips with probable surgical drain within. Mid small bowel loops measure up to 4.0 cm. Contrast within normal caliber ascending colon. IMPRESSION: Mild small bowel dilatation within the mid abdomen, favoring adynamic ileus. Low-grade partial small bowel obstruction could look similar. Electronically Signed   By: Abigail Miyamoto M.D.   On: 06/13/2016 09:33   Dg Abd Acute W/chest  Result Date: 06/15/2016 CLINICAL DATA:  Status post sigmoid colectomy 5 days ago;  history of CHF, coronary artery disease, former smoker, diabetes EXAM: DG ABDOMEN ACUTE W/ 1V CHEST COMPARISON:  Chest x-ray of June 11, 2016 and abdominal radiograph of June 13, 2016. FINDINGS: The lungs are adequately inflated. The interstitial markings are mildly prominent. There is no alveolar infiltrate. There is no large pleural effusion. The cardiac silhouette remains enlarged. The central pulmonary vascularity is engorged. A right internal jugular venous catheter tip projects over the proximal SVC. Within the abdomen there is contrast in the right colon as well as in the rectum. There are loops of mildly  distended gas-filled small bowel to the left of midline. These are less conspicuous than on yesterday's study despite removal of the nasogastric tube. Vertical lower abdominal and pelvic skin staples are present with the drainage tube. There is calcification that projects over the lower pole of the right kidney. There may be calcifications overlying the left kidney. The bony structures exhibit no acute abnormalities. IMPRESSION: Cardiomegaly with mild pulmonary vascular prominence consistent with low-grade CHF. No pneumonia or significant pleural effusion. Decreased gaseous distention of small-bowel loops. Electronically Signed   By: David  Martinique M.D.   On: 06/15/2016 07:43   Dg Abd Portable 1v  Result Date: 06/13/2016 CLINICAL DATA:  Readjustment of NG tube EXAM: PORTABLE ABDOMEN - 1 VIEW COMPARISON:  06/13/2016 FINDINGS: Distended small bowel loops are noted mid abdomen suspicious for small bowel obstruction. NG tube with tip in distal stomach. IMPRESSION: Distended small bowel loops mid abdomen again noted. NG tube with tip in distal stomach. Electronically Signed   By: Lahoma Crocker M.D.   On: 06/13/2016 12:07     CBC  Recent Labs Lab 06/13/16 0418 06/14/16 0452 06/15/16 0445 06/16/16 0501 06/17/16 0443  WBC 15.5* 14.6* 15.1* 14.9* 15.5*  HGB 8.8* 8.1* 7.7* 7.6* 7.6*  HCT 25.8* 24.1* 22.3* 23.0* 22.6*  PLT 245 282 284 305 328  MCV 86.0 86.7 86.0 87.0 86.6  MCH 29.3 29.3 29.8 28.9 29.2  MCHC 34.1 33.8 34.6 33.2 33.7  RDW 15.1* 15.3* 15.1* 15.3* 15.0*    Chemistries   Recent Labs Lab 06/11/16 0503  06/13/16 0418 06/14/16 0452 06/15/16 0445 06/16/16 0501 06/17/16 0443  NA 135  < > 136 138 136 136 135  K 4.4  < > 3.7 3.5 3.5 3.9 4.1  CL 108  < > 107 109 108 107 106  CO2 20*  < > 22 23 23 22  20*  GLUCOSE 251*  < > 278* 102* 120* 155* 133*  BUN 34*  < > 37* 47* 58* 64* 66*  CREATININE 1.95*  < > 1.66* 1.70* 2.18* 2.39* 2.47*  CALCIUM 8.6*  < > 9.1 8.7* 8.3* 8.5* 8.4*  MG 1.8   < > 1.6* 1.9 1.8 1.8 2.1  AST 20  --   --   --   --  20  --   ALT 10*  --   --   --   --  14  --   ALKPHOS 67  --   --   --   --  62  --   BILITOT 0.7  --   --   --   --  0.3  --   < > = values in this interval not displayed. ------------------------------------------------------------------------------------------------------------------ estimated creatinine clearance is 25.2 mL/min (by C-G formula based on SCr of 2.47 mg/dL). ------------------------------------------------------------------------------------------------------------------  Recent Labs  06/16/16 0501  HGBA1C 7.9*   ------------------------------------------------------------------------------------------------------------------ No results for input(s): CHOL, HDL, LDLCALC, TRIG, CHOLHDL, LDLDIRECT in  the last 72 hours. ------------------------------------------------------------------------------------------------------------------ No results for input(s): TSH, T4TOTAL, T3FREE, THYROIDAB in the last 72 hours.  Invalid input(s): FREET3 ------------------------------------------------------------------------------------------------------------------ No results for input(s): VITAMINB12, FOLATE, FERRITIN, TIBC, IRON, RETICCTPCT in the last 72 hours.  Coagulation profile No results for input(s): INR, PROTIME in the last 168 hours.  No results for input(s): DDIMER in the last 72 hours.  Cardiac Enzymes No results for input(s): CKMB, TROPONINI, MYOGLOBIN in the last 168 hours.  Invalid input(s): CK ------------------------------------------------------------------------------------------------------------------ Invalid input(s): [place]   [name]  is a 70 y.o. female with a known history of Hypertension, insulin-dependent diabetes mellitus, CK D, rheumatoid arthritis, hypothyroidism presents to the hospital secondary to worsening abdominal pain and noted to have perforated sigmoid  diverticulitis.  * # Acute kidney injury over CKD stage III - worsening Started on LR. Continue I/Os Nephrology consulted. Appreciate input Discussed with Dr. Juleen China  # Essential hypertension -   Lopressor and Norvasc. - Continue to hold hydrochlorothiazide and Lisinopril.  - IV hydralazine as needed.  # Insulin-dependent diabetes mellitus - well controlled Patient is getting insulin to TPN, Lantus and scheduled NovoLog. Sliding scale insulin with Accu-Cheks every 4 hours  # Mild Acute on chronic chronic diastolic CHF  Resolved  # Rheumatoid arthritis-follows with rheumatology.  # Perforated sigmoid status post surgery On IV zosyn. WBC still high NG tube removed  # Acute blood loss anemia over AOCD Monitor Transfuse if < 7     Code Status Orders        Start     Ordered   06/08/16 0631  Full code  Continuous     06/08/16 0630    Code Status History    Date Active Date Inactive Code Status Order ID Comments User Context   11/05/2015 10:26 AM 11/10/2015  5:22 PM Full Code [id number]  Saundra Shelling, MD ED   11/05/2015  7:14 AM 11/05/2015 10:26 AM Full Code [id number]  Saundra Shelling, MD ED     DVT Prophylaxis SCDs  Lab Results  Component Value Date   PLT 328 06/17/2016   Time Spent in minutes 25 minute   Hillary Bow R M.D on 06/17/2016 at 12:20 PM  Between 7am to 6pm - Pager - [phone]  After 6pm go to [email or web address] - password [email or web address] [place]   Office  [phone]

## 2016-06-17 NOTE — Progress Notes (Signed)
Nutrition Follow-up  DOCUMENTATION CODES:   Not applicable  INTERVENTION:  TPN: recommend continuing 5%AA/15%Dextrose at rate of 40 ml/hr at this time; continue to assess. Monitor po intake and will make further recommendations regarding TPN on follow-up Education: provided pt with "Ileostomy Nutrition Therapy" handout and verbal education; full education note entered -Cater to pt preferences, encourage fluid intake, smaller, more frequent meals. May benefit from nutritional supplement if po intake inadequate   NUTRITION DIAGNOSIS:   Inadequate oral intake related to altered GI function as evidenced by NPO status.  Being addressed via TPN, diet as tolerated  GOAL:   Patient will meet greater than or equal to 90% of their needs  MONITOR:   Diet advancement (TPN)  REASON FOR ASSESSMENT:   Consult Diet education  ASSESSMENT:    5%AA/20%Dextrose at rate of 40 ml/hr  Ileostomy with >2L out, +emesis over night, mild persistent ileus that is resolving. UOP 1.175 mL per I/O but noted some unmeasured UOP as well.   Pt tolerating some po; appetite slowly improving. Pt ate 1/2 pancake, some home fries, some cream of wheat and 2 slices of canned pears at breakfast this AM  Diet Order:  DIET SOFT Room service appropriate? Yes; Fluid consistency: Thin .TPN (CLINIMIX-E) Adult    Skin:  Reviewed, no issues  Last BM:  8/24 >2 L via ileostomy in past 24 hours   Labs: Creatinine 2.47, BUN 66  Glucose Profile:   Recent Labs  06/17/16 0059 06/17/16 0454 06/17/16 0735  GLUCAP 160* 131* 102*    Meds:  LR at 75 ml/hr  Height:   Ht Readings from Last 1 Encounters:  06/08/16 5\' 5"  (1.651 m)    Weight:   Wt Readings from Last 1 Encounters:  06/17/16 225 lb 14.4 oz (102.5 kg)    Ideal Body Weight:     BMI:  Body mass index is 37.59 kg/m.  Estimated Nutritional Needs:   Kcal:  1500-1900 kcals/d  Protein:  75-95 g/d  Fluid:  >/= 1559ml/d  EDUCATION NEEDS:   No  education needs identified at this time  [place], [place], [place] [phone] Pager  [phone] Weekend/On-Call Pager

## 2016-06-17 NOTE — Progress Notes (Signed)
POD # 9 for sigmoid colectomy and loops ileostomy for perf diverticulitis CT scan personal review there is no evidence of discrete abscess. There is some fluid in the pelvis. No evidence of anastomotic leak. She feels better today but the had emesis for the contrast last night. White count is still elevated as well as creatinine. AVSS Ileostomy > 2 lts out  PE NAD Abd: soft, representing place with Penrose in place. No evidence of infection. Ileostomy is patent with good output  A/P mild persistent ileus that is resolving for now we'll continue TPN. A letter her E ad lib. Still not meeting nutritional needs. Continue Zosyn for now. No need for dialysis at this time will continue to monitor her kidney function. Increased her fluids to keep up with the high output ostomy. I will hold off for any intervention to slow down her ileostomy output since  there is some dilated loops of bowel  Mobilize

## 2016-06-17 NOTE — Progress Notes (Signed)
[facility]  ROUNDING NOTE   Subjective:   Tolerating clears.   Objective:  Vital signs in last 24 hours:  Temp:  [97.7 F (36.5 C)-98.3 F (36.8 C)] 97.7 F (36.5 C) (08/24 1254) Pulse Rate:  [65-85] 65 (08/24 1254) Resp:  [16-20] 16 (08/24 1254) BP: (112-140)/(48-59) 112/56 (08/24 1254) SpO2:  [98 %-100 %] 98 % (08/24 1254) Weight:  [102.5 kg (225 lb 14.4 oz)] 102.5 kg (225 lb 14.4 oz) (08/24 0504)  Weight change: -0.136 kg (-4.8 oz) Filed Weights   06/15/16 0443 06/16/16 0500 06/17/16 0504  Weight: 103.4 kg (228 lb) 102.6 kg (226 lb 3.2 oz) 102.5 kg (225 lb 14.4 oz)    Intake/Output: I/O last 3 completed shifts: In: 5107.9 [P.O.:195; I.V.:4645; IV Piggyback:267.9] Out: 5150 [Urine:2225; Emesis/NG output:200; Stool:2725]   Intake/Output this shift:  Total I/O In: 1047.8 [P.O.:240; I.V.:735.2; IV Piggyback:72.6] Out: 700 [Urine:400; Stool:300]  Physical Exam: General: NAD, laying in bed  Head: Normocephalic, atraumatic. Moist oral mucosal membranes  Eyes: Anicteric, PERRL  Neck: Supple, trachea midline  Lungs:  Clear to auscultation  Heart: Regular rate and rhythm  Abdomen:  +tender , midline incision with staples, +ostomy, +bowel sounds  Extremities:  + peripheral edema.  Neurologic: Nonfocal, moving all four extremities  Skin: No lesions       Basic Metabolic Panel:  Recent Labs Lab 06/11/16 0503 06/12/16 0450 06/13/16 0418 06/14/16 0452 06/15/16 0445 06/16/16 0501 06/17/16 0443  NA 135 136 136 138 136 136 135  K 4.4 4.3 3.7 3.5 3.5 3.9 4.1  CL 108 110 107 109 108 107 106  CO2 20* 23 22 23 23 22  20*  GLUCOSE 251* 239* 278* 102* 120* 155* 133*  BUN 34* 33* 37* 47* 58* 64* 66*  CREATININE 1.95* 1.63* 1.66* 1.70* 2.18* 2.39* 2.47*  CALCIUM 8.6* 9.1 9.1 8.7* 8.3* 8.5* 8.4*  MG 1.8 1.9 1.6* 1.9 1.8 1.8 2.1  PHOS 2.1* 1.4* 3.1  --  3.9 4.5  --     Liver Function Tests:  Recent Labs Lab 06/11/16 0503 06/16/16 0501  AST 20 20   ALT 10* 14  ALKPHOS 67 62  BILITOT 0.7 0.3  PROT 6.2* 6.1*  ALBUMIN 2.8* 2.3*   No results for input(s): LIPASE, AMYLASE in the last 168 hours. No results for input(s): AMMONIA in the last 168 hours.  CBC:  Recent Labs Lab 06/13/16 0418 06/14/16 0452 06/15/16 0445 06/16/16 0501 06/17/16 0443  WBC 15.5* 14.6* 15.1* 14.9* 15.5*  HGB 8.8* 8.1* 7.7* 7.6* 7.6*  HCT 25.8* 24.1* 22.3* 23.0* 22.6*  MCV 86.0 86.7 86.0 87.0 86.6  PLT 245 282 284 305 328    Cardiac Enzymes: No results for input(s): CKTOTAL, CKMB, CKMBINDEX, TROPONINI in the last 168 hours.  BNP: Invalid input(s): POCBNP  CBG:  Recent Labs Lab 06/16/16 2056 06/17/16 0059 06/17/16 0454 06/17/16 0735 06/17/16 1203  GLUCAP 146* 160* 131* 102* 260*    Microbiology: Results for orders placed or performed during the hospital encounter of 06/08/16  CULTURE, BLOOD (ROUTINE X 2) w Reflex to ID Panel     Status: None   Collection Time: 06/08/16  6:46 PM  Result Value Ref Range Status   Specimen Description BLOOD LEFT HAND  Final   Special Requests BOTTLES DRAWN AEROBIC AND ANAEROBIC 4ML  Final   Culture NO GROWTH 5 DAYS  Final   Report Status 06/13/2016 FINAL  Final  CULTURE, BLOOD (ROUTINE X 2) w Reflex to ID Panel  Status: None   Collection Time: 06/08/16  7:03 PM  Result Value Ref Range Status   Specimen Description BLOOD LEFT HAND  Final   Special Requests BOTTLES DRAWN AEROBIC AND ANAEROBIC 5ML  Final   Culture NO GROWTH 5 DAYS  Final   Report Status 06/13/2016 FINAL  Final  Aerobic/Anaerobic Culture (surgical/deep wound)     Status: None   Collection Time: 06/09/16 10:02 AM  Result Value Ref Range Status   Specimen Description ABDOMEN  Final   Special Requests NONE  Final   Gram Stain   Final    MODERATE WBC PRESENT,BOTH PMN AND MONONUCLEAR FEW GRAM NEGATIVE RODS RARE GRAM POSITIVE COCCI IN PAIRS RARE SQUAMOUS EPITHELIAL CELLS PRESENT    Culture   Final    FEW ESCHERICHIA COLI FEW  BACTEROIDES FRAGILIS FEW BACTEROIDES THETAIOTAOMICRON BETA LACTAMASE POSITIVE Performed at [facility]    Report Status 06/13/2016 FINAL  Final   Organism ID, Bacteria ESCHERICHIA COLI  Final      Susceptibility   Escherichia coli - MIC*    AMPICILLIN 4 SENSITIVE Sensitive     CEFAZOLIN <=4 SENSITIVE Sensitive     CEFEPIME <=1 SENSITIVE Sensitive     CEFTAZIDIME <=1 SENSITIVE Sensitive     CEFTRIAXONE <=1 SENSITIVE Sensitive     CIPROFLOXACIN <=0.25 SENSITIVE Sensitive     GENTAMICIN <=1 SENSITIVE Sensitive     IMIPENEM <=0.25 SENSITIVE Sensitive     TRIMETH/SULFA <=20 SENSITIVE Sensitive     AMPICILLIN/SULBACTAM 4 SENSITIVE Sensitive     PIP/TAZO <=4 SENSITIVE Sensitive     Extended ESBL NEGATIVE Sensitive     * FEW ESCHERICHIA COLI    Coagulation Studies: No results for input(s): LABPROT, INR in the last 72 hours.  Urinalysis: No results for input(s): COLORURINE, LABSPEC, PHURINE, GLUCOSEU, HGBUR, BILIRUBINUR, KETONESUR, PROTEINUR, UROBILINOGEN, NITRITE, LEUKOCYTESUR in the last 72 hours.  Invalid input(s): APPERANCEUR    Imaging: Ct Abdomen Pelvis Wo Contrast  Result Date: 06/16/2016 CLINICAL DATA:  Persistent elevated white blood cell count. Postoperative day number gait after sigmoid colectomy and end of loop ileostomy for perforated diverticulitis. The patient is afebrile. EXAM: CT ABDOMEN AND PELVIS WITHOUT CONTRAST TECHNIQUE: Multidetector CT imaging of the abdomen and pelvis was performed following the standard protocol without IV contrast. COMPARISON:  CT abdomen and pelvis 06/08/2016. FINDINGS: The lung bases are clear. There is cardiomegaly. No pleural or pericardial effusion. Small hiatal hernia is noted. Calcific coronary artery disease is seen. Since the prior CT scan, the patient has undergone partial sigmoid colectomy and loop ileostomy placement. In the anterior aspect of the left pelvis immediately subjacent to the anterior abdominal wall musculature at  the surgical site, there is a small fluid collection measuring 1.9 cm AP x 4.0 cm transverse x 4.2 cm craniocaudal. There is also small amount of free pelvic fluid and fluid about small bowel loops in the right lower quadrant. The left ovary is again identified and appears more prominent. There may be a small amount of fluid adjacent to the ovary. Small bowel loops are mildly dilated at 3.0 cm with some air-fluid levels present. Contrast extends into the patient's ileostomy. High attenuation material within the gallbladder is consistent with vicarious excretion of IV contrast from the patient's prior CT. The liver, adrenal glands, spleen, pancreas and kidneys are unremarkable. No lymphadenopathy is identified. No focal bony abnormality is seen. IMPRESSION: Status post partial sigmoid colectomy and loop ileostomy placement. Small fluid collection in the anterior left lower quadrant at  the surgical site could be due to normal postoperative change, phlegmon or possibly early abscess. No drainable collection is identified. Increased fullness of the left ovary may be due to small amount of adjacent fluid with a small volume of free pelvic fluid noted. Mild dilatation of small bowel with air-fluid levels compatible with ileus. No obstruction. Calcific coronary artery disease. Small hiatal hernia. Electronically Signed   By: Inge Rise M.D.   On: 06/16/2016 17:47     Medications:   . Marland KitchenTPN (CLINIMIX-E) Adult 40 mL/hr at 06/16/16 1928  . lactated ringers 75 mL/hr at 06/17/16 0717   . amLODipine  2.5 mg Oral Daily  . famotidine (PEPCID) IV  20 mg Intravenous Q24H  . [START ON 06/18/2016] heparin subcutaneous  5,000 Units Subcutaneous Q8H  . insulin aspart  0-15 Units Subcutaneous Q4H  . insulin glargine  15 Units Subcutaneous Daily  . metoprolol tartrate  50 mg Oral BID  . piperacillin-tazobactam (ZOSYN)  IV  3.375 g Intravenous Q8H   acetaminophen, diphenhydrAMINE, hydrALAZINE, ipratropium-albuterol,  morphine injection, ondansetron **OR** ondansetron (ZOFRAN) IV, promethazine  Assessment/ Plan:  Ms. AMETHYST RUNSER is a 70 y.o. black female with hypertension, diabetes mellitus type II, hyperlipidemia, coronary artery disease, hypothyroidism, temporal arteritis, osteoarthritis. Admitted on 06/08/2016 for sigmoid diverticulitis status post colectomy  1. Acute Renal Failure on chronic kidney disease stage IV with proteinuria: baseline creatinine of 1.8-2.2. Chronic kidney disease secondary to diabetic nephropathy.  Acute renal failure seems to be prerenal in nature with no PO intake. Currently on IV fluids and TPN.  - Agree with IV fluids: LR  - Continue TPN, electrolytes are stable.  - Renally dose all medications.   2. Hypertension: at goal. With peripheral edema, most likely due to hypoalbuminemia. IV albumin administered - amlodipine. Holding home lisinopril and hydrochlorothiazide  3. Diabetes Mellitus type II with chronic kidney disease: insulin dependent. Hemoglobin A1c of 8.6% on 02/24/16.  - Continue glucose control.     LOS: Venetie, Leviathan Macera 8/24/20171:48 PM

## 2016-06-17 NOTE — Progress Notes (Signed)
PARENTERAL NUTRITION CONSULT NOTE - INITIAL  Pharmacy Consult for TPN Indication: colon resection, NPO  Allergies  Allergen Reactions  . Doxycycline Other (See Comments)    Reaction: unknown  . Levaquin [Levofloxacin] Other (See Comments)    Causes joint pain  . Penicillins Rash and Other (See Comments)    Has patient had a PCN reaction causing immediate rash, facial/tongue/throat swelling, SOB or lightheadedness with hypotension: Yes Has patient had a PCN reaction causing severe rash involving mucus membranes or skin necrosis: No Has patient had a PCN reaction that required hospitalization No Has patient had a PCN reaction occurring within the last 10 years: No If all of the above answers are "NO", then may proceed with Cephalosporin use.  . Sulfa Antibiotics Swelling and Rash    Patient Measurements: Height: 5\' 5"  (165.1 cm) Weight: 225 lb 14.4 oz (102.5 kg) IBW/kg (Calculated) : 57   Vital Signs: Temp: 98.1 F (36.7 C) (08/24 0556) Temp Source: Oral (08/24 0556) BP: 117/52 (08/24 0556) Pulse Rate: 81 (08/24 0556) Intake/Output from previous day: 08/23 0701 - 08/24 0700 In: 2917.9 [P.O.:195; I.V.:2544; IV Piggyback:178.9] Out: [id number] [Urine:1175; Emesis/NG output:200; [id number] Intake/Output from this shift: No intake/output data recorded.  Labs:  Recent Labs  06/15/16 0445 06/16/16 0501 06/17/16 0443  WBC 15.1* 14.9* 15.5*  HGB 7.7* 7.6* 7.6*  HCT 22.3* 23.0* 22.6*  PLT 284 305 328     Recent Labs  06/15/16 0445 06/16/16 0501 06/17/16 0443  NA 136 136 135  K 3.5 3.9 4.1  CL 108 107 106  CO2 23 22 20*  GLUCOSE 120* 155* 133*  BUN 58* 64* 66*  CREATININE 2.18* 2.39* 2.47*  CALCIUM 8.3* 8.5* 8.4*  MG 1.8 1.8 2.1  PHOS 3.9 4.5  --   PROT  --  6.1*  --   ALBUMIN  --  2.3*  --   AST  --  20  --   ALT  --  14  --   ALKPHOS  --  62  --   BILITOT  --  0.3  --    Estimated Creatinine Clearance: 25.2 mL/min (by C-G formula based on SCr of 2.47  mg/dL).    Recent Labs  06/17/16 0059 06/17/16 0454 06/17/16 0735  GLUCAP 160* 131* 102*    Insulin Requirements in the past 24 hours:  14 units of SSI; 15 units of Lantus    Pt on Lantus at home  Current Nutrition:  Clinimix E 5/20 with trace elements and MVI at 40 ml/hr   Assessment: 70 yo female s/p colon resection surgery for diverticulitis on 8/16 started TPN 8/17 All electrolytes are within normal limits.   Spoke with RD Suzy Bouchard and rate will remain the same.  Plan:   Will decrease insulin in TPN to 15  units/day. Will continue moderate scale SSI. Will continue Lantus 15 units daily.    Pharmacy will continue to monitor and adjust per consult.   Larene Beach, PharmD 06/17/2016,8:46 AM

## 2016-06-17 NOTE — Progress Notes (Signed)
Physical Therapy Treatment Patient Details Name: [name] MRN: [id number] DOB: 1946-02-28 Today's Date: 06/17/2016    History of Present Illness Pt is a 70 y/o female that presents with abdominal pain, found to have perforated sigmoid diverticulitis, operated on and now has colostomy bag. She has continued to have difficulty with nausea, x-rays show likely ileus, treated with NG tube.     PT Comments    Pt presented without c/o nausea this date and improved strength and functional mobility.  Pt able to take forward/backward and side-steps at EOB with RW and CGA for a total of grossly 10-15' without SOB or nausea.  Min A with bed mobility with training focusing on log roll technique secondary to new colostomy.   Follow Up Recommendations  SNF     Equipment Recommendations  Rolling walker with 5" wheels    Recommendations for Other Services       Precautions / Restrictions Precautions Precautions: Fall Restrictions Weight Bearing Restrictions: No    Mobility  Bed Mobility Overal bed mobility: Needs Assistance Bed Mobility: Supine to Sit;Sit to Supine  Log roll technique training     Supine to sit: Min assist Sit to supine: Min assist   General bed mobility comments: HOB raised, used rail, log roll technique  Transfers Overall transfer level: Needs assistance Equipment used: Rolling walker (2 wheeled) Transfers: Sit to/from Stand Sit to Stand: Min guard         General transfer comment: CGA with sit-to-stand from elevated EOB  Ambulation/Gait Ambulation/Gait assistance: Min guard Ambulation Distance (Feet): 10 Feet (Side-stepping and step forward/backward at EOB) Assistive device: Rolling walker (2 wheeled) Gait Pattern/deviations: Step-to pattern;Trunk flexed;Shuffle     General Gait Details: Improved tolerance without c/o nausea    Stairs            Wheelchair Mobility    Modified Rankin (Stroke Patients Only)       Balance      Sitting balance-Leahy Scale: Good       Standing balance-Leahy Scale: Fair                      Cognition Arousal/Alertness: Awake/alert Behavior During Therapy: WFL for tasks assessed/performed Overall Cognitive Status: Within Functional Limits for tasks assessed                      Exercises Total Joint Exercises Ankle Circles/Pumps: Strengthening;Both;20 reps Quad Sets: AROM;Both;20 reps Gluteal Sets: AROM;Both;20 reps Heel Slides: AROM;Both;10 reps Hip ABduction/ADduction: AROM;Both;10 reps Straight Leg Raises: AAROM;Both;10 reps Long Arc Quad: Both;AROM;10 reps Knee Flexion: AROM;Both;10 reps Marching in Standing: AROM;Both;10 reps Other Exercises Other Exercises: Bed mobility training with log roll technique, min A with mod verbal cues for sequencing Other Exercises: HEP education/review with BLE APs, GS, and QS 10-15 reps hourly    General Comments        Pertinent Vitals/Pain Pain Assessment: No/denies pain    Home Living                      Prior Function            PT Goals (current goals can now be found in the care plan section) Progress towards PT goals: Progressing toward goals    Frequency  Min 2X/week    PT Plan Current plan remains appropriate    Co-evaluation             End of Session Equipment Utilized  During Treatment: Gait belt Activity Tolerance: Patient tolerated treatment well;No increased pain Patient left: in bed;with call bell/phone within reach;with bed alarm set     Time: [id number] PT Time Calculation (min) (ACUTE ONLY): 30 min  Charges:  $Therapeutic Exercise: 8-22 mins $Therapeutic Activity: 8-22 mins                    G Codes:      DRoyetta Asal PT, DPT 06/17/16, 5:14 PM

## 2016-06-17 NOTE — Progress Notes (Signed)
Nutrition Education Note  RD consulted for nutrition education regarding a diet status post ileostomy.  RD provided "Ileostomy Nutrition Therapy" handout from the Academy of [employer]. Encouraged patient to keep fiber intake below 8 grams during transition from liquids to solids, and below 13 grams per day as symptoms subside. Encouraged patient to consume refined and white grain foods with less than 2g of fiber per serving as well as soft, well-cooked proteins, and well cooked vegetables without seeds or skin.  RD discussed why it is important to adhere to list of recommended foods, and foods to avoid, to maintain ostomy integrity and decrease risk of gas, diarrhea, and blockage related complications. Encouraged patient to consume 8 to 10 cups of liquid per day and to drink liquids 30 minutes after meals or snacks to prevent flushing.   Provided tips to ensure adequate absorption of medications, vitamins, and minerals. After 6 weeks, as patient's diet returns to normal, encouraged adding 1 new food in per day, for a few days to monitor tolerance.  Expect good compliance  Full assessment note entered as well  Kerman Passey MS, [place], LDN [phone] Pager  [phone] Weekend/On-Call Pager

## 2016-06-18 LAB — CBC
HCT: 21.8 % — ABNORMAL LOW (ref 35.0–47.0)
Hemoglobin: 7.5 g/dL — ABNORMAL LOW (ref 12.0–16.0)
MCH: 29.2 pg (ref 26.0–34.0)
MCHC: 34.3 g/dL (ref 32.0–36.0)
MCV: 85.2 fL (ref 80.0–100.0)
PLATELETS: 374 10*3/uL (ref 150–440)
RBC: 2.55 MIL/uL — AB (ref 3.80–5.20)
RDW: 14.7 % — ABNORMAL HIGH (ref 11.5–14.5)
WBC: 16.3 10*3/uL — AB (ref 3.6–11.0)

## 2016-06-18 LAB — BASIC METABOLIC PANEL WITH GFR
Anion gap: 7 (ref 5–15)
BUN: 55 mg/dL — ABNORMAL HIGH (ref 6–20)
CO2: 22 mmol/L (ref 22–32)
Calcium: 8.6 mg/dL — ABNORMAL LOW (ref 8.9–10.3)
Chloride: 109 mmol/L (ref 101–111)
Creatinine, Ser: 2.29 mg/dL — ABNORMAL HIGH (ref 0.44–1.00)
GFR calc Af Amer: 24 mL/min — ABNORMAL LOW
GFR calc non Af Amer: 20 mL/min — ABNORMAL LOW
Glucose, Bld: 151 mg/dL — ABNORMAL HIGH (ref 65–99)
Potassium: 4.2 mmol/L (ref 3.5–5.1)
Sodium: 138 mmol/L (ref 135–145)

## 2016-06-18 LAB — GLUCOSE, CAPILLARY
Glucose-Capillary: 146 mg/dL — ABNORMAL HIGH (ref 65–99)
Glucose-Capillary: 152 mg/dL — ABNORMAL HIGH (ref 65–99)
Glucose-Capillary: 155 mg/dL — ABNORMAL HIGH (ref 65–99)
Glucose-Capillary: 163 mg/dL — ABNORMAL HIGH (ref 65–99)
Glucose-Capillary: 220 mg/dL — ABNORMAL HIGH (ref 65–99)
Glucose-Capillary: 232 mg/dL — ABNORMAL HIGH (ref 65–99)
Glucose-Capillary: 239 mg/dL — ABNORMAL HIGH (ref 65–99)

## 2016-06-18 LAB — MAGNESIUM: Magnesium: 2 mg/dL (ref 1.7–2.4)

## 2016-06-18 LAB — PHOSPHORUS: Phosphorus: 3.7 mg/dL (ref 2.5–4.6)

## 2016-06-18 MED ORDER — TRACE MINERALS CR-CU-MN-SE-ZN 10-1000-500-60 MCG/ML IV SOLN
40.0000 mL/h | INTRAVENOUS | Status: DC
  Filled 2016-06-18: qty 960

## 2016-06-18 MED ORDER — TRACE MINERALS CR-CU-MN-SE-ZN 10-1000-500-60 MCG/ML IV SOLN
INTRAVENOUS | Status: DC
  Administered 2016-06-18: 18:00:00 via INTRAVENOUS
  Filled 2016-06-18: qty 960

## 2016-06-18 NOTE — Clinical Social Work Note (Signed)
Patient has decided to go to rehab and has chosen [facility]. [facility] has been notified. MD states patient more than likely will not discharge until the first of the week. [name] Leff MSW,LCSW [phone]

## 2016-06-18 NOTE — Progress Notes (Addendum)
Initial Nutrition Assessment  DOCUMENTATION CODES:   Not applicable  INTERVENTION:  -Continue 5% AA/20% dextrose with electrolytes, MVI and trace elements at 16ml/hr for one more day per surgery note.   -Encouraged po intake. Pt with questions regarding menu items and questions addressed.    NUTRITION DIAGNOSIS:   Inadequate oral intake related to altered GI function as evidenced by NPO status.    GOAL:   Patient will meet greater than or equal to 90% of their needs    MONITOR:   Diet advancement (TPN)  REASON FOR ASSESSMENT:   Consult Diet education  ASSESSMENT:    Pt reports tolerated 1/2 of lactaid milk, 100% home fries, and more than few bites of omelet, 1/2 toast and 1/2 banana this am for breakfast.   Reports last night for dinner ate cereal and milk.  "I often have a light supper."    TPN continues at 6ml/hr  Medications reviewed: LR at 61ml/hr, lantus, aspart  Labs reviewed: BUN 55, creatinine 2.29, glucose 151, WBC 16.3  Ileostomy output: 82ml noted   Diet Order:  DIET SOFT Room service appropriate? Yes; Fluid consistency: Thin .TPN (CLINIMIX-E) Adult  Skin:  Reviewed, no issues  Last BM:  8/24 >2 L via ileostomy in past 24 hours  Height:   Ht Readings from Last 1 Encounters:  06/08/16 5\' 5"  (1.651 m)    Weight:   Wt Readings from Last 1 Encounters:  06/18/16 226 lb (102.5 kg)    Ideal Body Weight:     BMI:  Body mass index is 37.61 kg/m.  Estimated Nutritional Needs:   Kcal:  1500-1900 kcals/d  Protein:  75-95 g/d  Fluid:  >/= 1522ml/d  EDUCATION NEEDS:   No education needs identified at this time  [name], [name], [name] (pager) Weekend/On-Call pager [phone])

## 2016-06-18 NOTE — Progress Notes (Signed)
PARENTERAL NUTRITION CONSULT NOTE - FOLLOW UP   Pharmacy Consult for TPN Indication: colon resection, NPO  Allergies  Allergen Reactions  . Doxycycline Other (See Comments)    Reaction: unknown  . Levaquin [Levofloxacin] Other (See Comments)    Causes joint pain  . Penicillins Rash and Other (See Comments)    Has patient had a PCN reaction causing immediate rash, facial/tongue/throat swelling, SOB or lightheadedness with hypotension: Yes Has patient had a PCN reaction causing severe rash involving mucus membranes or skin necrosis: No Has patient had a PCN reaction that required hospitalization No Has patient had a PCN reaction occurring within the last 10 years: No If all of the above answers are "NO", then may proceed with Cephalosporin use.  . Sulfa Antibiotics Swelling and Rash    Patient Measurements: Height: 5\' 5"  (165.1 cm) Weight: 226 lb (102.5 kg) IBW/kg (Calculated) : 57   Vital Signs: Temp: 97.7 F (36.5 C) (08/25 1325) Temp Source: Oral (08/25 1325) BP: 128/61 (08/25 1325) Pulse Rate: 86 (08/25 1325) Intake/Output from previous day: 08/24 0701 - 08/25 0700 In: 4004.4 [P.O.:720; I.V.:3123.3; IV Piggyback:161.1] Out: 3200 [Urine:2400; Stool:800] Intake/Output from this shift: Total I/O In: 1741.1 [P.O.:480; I.V.:1222.7; IV Piggyback:38.4] Out: 1050 [Urine:800; Stool:250]  Labs:  Recent Labs  06/16/16 0501 06/17/16 0443 06/18/16 0438  WBC 14.9* 15.5* 16.3*  HGB 7.6* 7.6* 7.5*  HCT 23.0* 22.6* 21.8*  PLT 305 328 374     Recent Labs  06/16/16 0501 06/17/16 0443 06/18/16 0438  NA 136 135 138  K 3.9 4.1 4.2  CL 107 106 109  CO2 22 20* 22  GLUCOSE 155* 133* 151*  BUN 64* 66* 55*  CREATININE 2.39* 2.47* 2.29*  CALCIUM 8.5* 8.4* 8.6*  MG 1.8 2.1 2.0  PHOS 4.5  --  3.7  PROT 6.1*  --   --   ALBUMIN 2.3*  --   --   AST 20  --   --   ALT 14  --   --   ALKPHOS 62  --   --   BILITOT 0.3  --   --    Estimated Creatinine Clearance: 27.1 mL/min  (by C-G formula based on SCr of 2.29 mg/dL).    Recent Labs  06/18/16 0441 06/18/16 0746 06/18/16 1154  GLUCAP 146* 152* 239*    Insulin Requirements in the past 24 hours:  14 units of SSI; 15 units of Lantus    Pt on Lantus at home  Current Nutrition:  Clinimix E 5/20 with trace elements and MVI at 40 ml/hr   Assessment: 70 yo female s/p colon resection surgery for diverticulitis on 8/16 started TPN 8/17 All electrolytes are within normal limits.  .  Plan:   Continue Clinimix E5/20 with trace and mvi @ 40 ml/hr with 15 units of regular insulin.  Will continue moderate scale SSI. Will continue Lantus 15 units daily.    Pharmacy will continue to monitor and adjust per consult.   Larene Beach, PharmD 06/18/2016,2:21 PM

## 2016-06-18 NOTE — Progress Notes (Signed)
[facility]  ROUNDING NOTE   Subjective:   Seated in chair  UOP 2400 Creatinine 2.29 (2.47)  Restarted on zosyn.    Objective:  Vital signs in last 24 hours:  Temp:  [97.6 F (36.4 C)-98.1 F (36.7 C)] 98.1 F (36.7 C) (08/25 0443) Pulse Rate:  [65-87] 81 (08/25 0928) Resp:  [16-18] 18 (08/25 0443) BP: (112-150)/(52-69) 136/65 (08/25 0928) SpO2:  [98 %-100 %] 99 % (08/25 0443) Weight:  [102.5 kg (226 lb)] 102.5 kg (226 lb) (08/25 0457)  Weight change: 0.045 kg (1.6 oz) Filed Weights   06/16/16 0500 06/17/16 0504 06/18/16 0457  Weight: 102.6 kg (226 lb 3.2 oz) 102.5 kg (225 lb 14.4 oz) 102.5 kg (226 lb)    Intake/Output: I/O last 3 completed shifts: In: 5316.4 [P.O.:900; I.V.:4206.3; IV Piggyback:210.1] Out: 5075 [Urine:2875; Stool:2200]   Intake/Output this shift:  Total I/O In: 995.2 [P.O.:360; I.V.:613; IV Piggyback:22.2] Out: 600 [Urine:350; Stool:250]  Physical Exam: General: NAD, laying in bed  Head: Normocephalic, atraumatic. Moist oral mucosal membranes  Eyes: Anicteric, PERRL  Neck: Supple, trachea midline  Lungs:  Clear to auscultation  Heart: Regular rate and rhythm  Abdomen:  +tender , midline incision with staples, +ostomy, +bowel sounds  Extremities:  + peripheral edema.  Neurologic: Nonfocal, moving all four extremities  Skin: No lesions       Basic Metabolic Panel:  Recent Labs Lab 06/12/16 0450 06/13/16 0418 06/14/16 0452 06/15/16 0445 06/16/16 0501 06/17/16 0443 06/18/16 0438  NA 136 136 138 136 136 135 138  K 4.3 3.7 3.5 3.5 3.9 4.1 4.2  CL 110 107 109 108 107 106 109  CO2 23 22 23 23 22  20* 22  GLUCOSE 239* 278* 102* 120* 155* 133* 151*  BUN 33* 37* 47* 58* 64* 66* 55*  CREATININE 1.63* 1.66* 1.70* 2.18* 2.39* 2.47* 2.29*  CALCIUM 9.1 9.1 8.7* 8.3* 8.5* 8.4* 8.6*  MG 1.9 1.6* 1.9 1.8 1.8 2.1 2.0  PHOS 1.4* 3.1  --  3.9 4.5  --  3.7    Liver Function Tests:  Recent Labs Lab 06/16/16 0501  AST 20  ALT 14   ALKPHOS 62  BILITOT 0.3  PROT 6.1*  ALBUMIN 2.3*   No results for input(s): LIPASE, AMYLASE in the last 168 hours. No results for input(s): AMMONIA in the last 168 hours.  CBC:  Recent Labs Lab 06/14/16 0452 06/15/16 0445 06/16/16 0501 06/17/16 0443 06/18/16 0438  WBC 14.6* 15.1* 14.9* 15.5* 16.3*  HGB 8.1* 7.7* 7.6* 7.6* 7.5*  HCT 24.1* 22.3* 23.0* 22.6* 21.8*  MCV 86.7 86.0 87.0 86.6 85.2  PLT 282 284 305 328 374    Cardiac Enzymes: No results for input(s): CKTOTAL, CKMB, CKMBINDEX, TROPONINI in the last 168 hours.  BNP: Invalid input(s): POCBNP  CBG:  Recent Labs Lab 06/17/16 1627 06/17/16 2044 06/18/16 0024 06/18/16 0441 06/18/16 0746  GLUCAP 148* 193* 155* 146* 152*    Microbiology: Results for orders placed or performed during the hospital encounter of 06/08/16  CULTURE, BLOOD (ROUTINE X 2) w Reflex to ID Panel     Status: None   Collection Time: 06/08/16  6:46 PM  Result Value Ref Range Status   Specimen Description BLOOD LEFT HAND  Final   Special Requests BOTTLES DRAWN AEROBIC AND ANAEROBIC 4ML  Final   Culture NO GROWTH 5 DAYS  Final   Report Status 06/13/2016 FINAL  Final  CULTURE, BLOOD (ROUTINE X 2) w Reflex to ID Panel  Status: None   Collection Time: 06/08/16  7:03 PM  Result Value Ref Range Status   Specimen Description BLOOD LEFT HAND  Final   Special Requests BOTTLES DRAWN AEROBIC AND ANAEROBIC 5ML  Final   Culture NO GROWTH 5 DAYS  Final   Report Status 06/13/2016 FINAL  Final  Aerobic/Anaerobic Culture (surgical/deep wound)     Status: None   Collection Time: 06/09/16 10:02 AM  Result Value Ref Range Status   Specimen Description ABDOMEN  Final   Special Requests NONE  Final   Gram Stain   Final    MODERATE WBC PRESENT,BOTH PMN AND MONONUCLEAR FEW GRAM NEGATIVE RODS RARE GRAM POSITIVE COCCI IN PAIRS RARE SQUAMOUS EPITHELIAL CELLS PRESENT    Culture   Final    FEW ESCHERICHIA COLI FEW BACTEROIDES FRAGILIS FEW BACTEROIDES  THETAIOTAOMICRON BETA LACTAMASE POSITIVE Performed at [facility]    Report Status 06/13/2016 FINAL  Final   Organism ID, Bacteria ESCHERICHIA COLI  Final      Susceptibility   Escherichia coli - MIC*    AMPICILLIN 4 SENSITIVE Sensitive     CEFAZOLIN <=4 SENSITIVE Sensitive     CEFEPIME <=1 SENSITIVE Sensitive     CEFTAZIDIME <=1 SENSITIVE Sensitive     CEFTRIAXONE <=1 SENSITIVE Sensitive     CIPROFLOXACIN <=0.25 SENSITIVE Sensitive     GENTAMICIN <=1 SENSITIVE Sensitive     IMIPENEM <=0.25 SENSITIVE Sensitive     TRIMETH/SULFA <=20 SENSITIVE Sensitive     AMPICILLIN/SULBACTAM 4 SENSITIVE Sensitive     PIP/TAZO <=4 SENSITIVE Sensitive     Extended ESBL NEGATIVE Sensitive     * FEW ESCHERICHIA COLI    Coagulation Studies: No results for input(s): LABPROT, INR in the last 72 hours.  Urinalysis: No results for input(s): COLORURINE, LABSPEC, PHURINE, GLUCOSEU, HGBUR, BILIRUBINUR, KETONESUR, PROTEINUR, UROBILINOGEN, NITRITE, LEUKOCYTESUR in the last 72 hours.  Invalid input(s): APPERANCEUR    Imaging: Ct Abdomen Pelvis Wo Contrast  Result Date: 06/16/2016 CLINICAL DATA:  Persistent elevated white blood cell count. Postoperative day number gait after sigmoid colectomy and end of loop ileostomy for perforated diverticulitis. The patient is afebrile. EXAM: CT ABDOMEN AND PELVIS WITHOUT CONTRAST TECHNIQUE: Multidetector CT imaging of the abdomen and pelvis was performed following the standard protocol without IV contrast. COMPARISON:  CT abdomen and pelvis 06/08/2016. FINDINGS: The lung bases are clear. There is cardiomegaly. No pleural or pericardial effusion. Small hiatal hernia is noted. Calcific coronary artery disease is seen. Since the prior CT scan, the patient has undergone partial sigmoid colectomy and loop ileostomy placement. In the anterior aspect of the left pelvis immediately subjacent to the anterior abdominal wall musculature at the surgical site, there is a small  fluid collection measuring 1.9 cm AP x 4.0 cm transverse x 4.2 cm craniocaudal. There is also small amount of free pelvic fluid and fluid about small bowel loops in the right lower quadrant. The left ovary is again identified and appears more prominent. There may be a small amount of fluid adjacent to the ovary. Small bowel loops are mildly dilated at 3.0 cm with some air-fluid levels present. Contrast extends into the patient's ileostomy. High attenuation material within the gallbladder is consistent with vicarious excretion of IV contrast from the patient's prior CT. The liver, adrenal glands, spleen, pancreas and kidneys are unremarkable. No lymphadenopathy is identified. No focal bony abnormality is seen. IMPRESSION: Status post partial sigmoid colectomy and loop ileostomy placement. Small fluid collection in the anterior left lower quadrant at  the surgical site could be due to normal postoperative change, phlegmon or possibly early abscess. No drainable collection is identified. Increased fullness of the left ovary may be due to small amount of adjacent fluid with a small volume of free pelvic fluid noted. Mild dilatation of small bowel with air-fluid levels compatible with ileus. No obstruction. Calcific coronary artery disease. Small hiatal hernia. Electronically Signed   By: Inge Rise M.D.   On: 06/16/2016 17:47     Medications:   . Marland KitchenTPN (CLINIMIX-E) Adult 40 mL/hr at 06/17/16 1711  . lactated ringers 75 mL/hr at 06/18/16 0453   . amLODipine  2.5 mg Oral Daily  . famotidine (PEPCID) IV  20 mg Intravenous Q24H  . heparin subcutaneous  5,000 Units Subcutaneous Q8H  . insulin aspart  0-15 Units Subcutaneous Q4H  . insulin glargine  15 Units Subcutaneous Daily  . metoprolol tartrate  50 mg Oral BID  . piperacillin-tazobactam (ZOSYN)  IV  3.375 g Intravenous Q8H   acetaminophen, diphenhydrAMINE, hydrALAZINE, ipratropium-albuterol, morphine injection, ondansetron **OR** ondansetron (ZOFRAN)  IV, promethazine  Assessment/ Plan:  [name] is a 70 y.o. black female with hypertension, diabetes mellitus type II, hyperlipidemia, coronary artery disease, hypothyroidism, temporal arteritis, osteoarthritis. Admitted on 06/08/2016 for sigmoid diverticulitis status post colectomy  1. Acute Renal Failure on chronic kidney disease stage IV with proteinuria: baseline creatinine of 1.8-2.2. Chronic kidney disease secondary to diabetic nephropathy.  Acute renal failure seems to be prerenal in nature with no PO intake. Currently on IV fluids and TPN.  - Agree with IV fluids: LR  - Continue TPN, electrolytes are stable.  - Renally dose all medications.   2. Hypertension: at goal. With peripheral edema, most likely due to hypoalbuminemia. IV albumin administered - amlodipine. Holding home lisinopril and hydrochlorothiazide  3. Diabetes Mellitus type II with chronic kidney disease: insulin dependent. Hemoglobin A1c of 8.6% on 02/24/16.  - Continue glucose control.     LOS: 10 [name] 8/25/201711:30 AM

## 2016-06-18 NOTE — Care Management Important Message (Signed)
Important Message  Patient Details  Name: [name] MRN: [id number] Date of Birth: 07-06-1946   Medicare Important Message Given:  Yes    Beverly Sessions, RN 06/18/2016, 11:56 AM

## 2016-06-18 NOTE — Progress Notes (Signed)
POD # 10 for sigmoid colectomy and loops ileostomy for perf diverticulitis Resolving ileus Persistent elevation of white count. Stable creatinine AVSS Decrease the ostomy output  PE NAD Abd: soft, stapels in place, penrose w some fibrinous drainage. Ostomy viable and working. No wound infection or abd wall abscess  A/P continue TPN for one more day until Po intake improves Continue IV a/bs Start looking at placement but I want to make sure her abd issues resolve before transfering her May need to repeat CT in 2-3 days  If her wbc continue to go up  No need for surgical intervention at this time AKI per neprhology

## 2016-06-18 NOTE — Progress Notes (Signed)
Pt had 8 beat run of non-sustained V tach on telemetry. VSS. Pt A&O and resting comfortably with no complaints. MD notified. No new orders received. Will continue to monitor pt.

## 2016-06-18 NOTE — Care Management (Signed)
Call from patient's daughter Loletha Carrow that the discharge plan is for patient to come live at her home in [place] and have [facility] home health to follow.  Upon further discussion with patient she states that the information I received from [facility] is not correct.  Patient wishes to go to SNF which has been recommend by PT, and once rehab completed then live with her daughter Loletha Carrow.  CSW updated.  Patient to call her daughter and update.  RNCM available if needed for discharge planning.

## 2016-06-18 NOTE — Progress Notes (Signed)
[facility] NAME: [name]    MR#:  [id number]  DATE OF BIRTH:  22-Mar-1946  SUBJECTIVE:   Patient here due to abdominal pain from perforated sigmoid diverticulitis. Status post colostomy. Sitting up in chair on TPN.  Tolerating PO o.k.  Still having some intermittent nausea.  No abdominal pain.   REVIEW OF SYSTEMS:    Review of Systems  Constitutional: Negative for chills and fever.  HENT: Negative for congestion and tinnitus.   Eyes: Negative for blurred vision and double vision.  Respiratory: Negative for cough, shortness of breath and wheezing.   Cardiovascular: Negative for chest pain, orthopnea and PND.  Gastrointestinal: Positive for abdominal pain and nausea. Negative for diarrhea.  Genitourinary: Negative for dysuria and hematuria.  Neurological: Negative for dizziness, sensory change and focal weakness.  All other systems reviewed and are negative.   Nutrition: Soft Diet Tolerating Diet: yes Tolerating PT: Await Eval.    DRUG ALLERGIES:   Allergies  Allergen Reactions  . Doxycycline Other (See Comments)    Reaction: unknown  . Levaquin [Levofloxacin] Other (See Comments)    Causes joint pain  . Penicillins Rash and Other (See Comments)    Has patient had a PCN reaction causing immediate rash, facial/tongue/throat swelling, SOB or lightheadedness with hypotension: Yes Has patient had a PCN reaction causing severe rash involving mucus membranes or skin necrosis: No Has patient had a PCN reaction that required hospitalization No Has patient had a PCN reaction occurring within the last 10 years: No If all of the above answers are "NO", then may proceed with Cephalosporin use.  . Sulfa Antibiotics Swelling and Rash    VITALS:  Blood pressure 128/61, pulse 86, temperature 97.7 F (36.5 C), temperature source Oral, resp. rate 18, height 5\' 5"  (1.651 m), weight 102.5 kg (226 lb), SpO2 98 %.  PHYSICAL  EXAMINATION:   Physical Exam  GENERAL:  70 y.o.-year-old patient sitting up in chair in no acute distress.  EYES: Pupils equal, round, reactive to light and accommodation. No scleral icterus. Extraocular muscles intact.  HEENT: Head atraumatic, normocephalic. Oropharynx and nasopharynx clear.  NECK:  Supple, no jugular venous distention. No thyroid enlargement, no tenderness.  LUNGS: Normal breath sounds bilaterally, no wheezing, rales, rhonchi. No use of accessory muscles of respiration.  CARDIOVASCULAR: S1, S2 normal. No murmurs, rubs, or gallops.  ABDOMEN: Soft, nontender, nondistended. + colostomy with liquid stool.  MId-abdomen dressing w/out acute drainage.  Bowel sounds present. No organomegaly or mass.  EXTREMITIES: No cyanosis, clubbing or edema b/l.    NEUROLOGIC: Cranial nerves II through XII are intact. No focal Motor or sensory deficits b/l.   PSYCHIATRIC: The patient is alert and oriented x 3.  SKIN: No obvious rash, lesion, or ulcer.    LABORATORY PANEL:   CBC  Recent Labs Lab 06/18/16 0438  WBC 16.3*  HGB 7.5*  HCT 21.8*  PLT 374   ------------------------------------------------------------------------------------------------------------------  Chemistries   Recent Labs Lab 06/16/16 0501  06/18/16 0438  NA 136  < > 138  K 3.9  < > 4.2  CL 107  < > 109  CO2 22  < > 22  GLUCOSE 155*  < > 151*  BUN 64*  < > 55*  CREATININE 2.39*  < > 2.29*  CALCIUM 8.5*  < > 8.6*  MG 1.8  < > 2.0  AST 20  --   --   ALT 14  --   --  ALKPHOS 62  --   --   BILITOT 0.3  --   --   < > = values in this interval not displayed. ------------------------------------------------------------------------------------------------------------------  Cardiac Enzymes No results for input(s): TROPONINI in the last 168 hours. ------------------------------------------------------------------------------------------------------------------  RADIOLOGY:  Ct Abdomen Pelvis Wo  Contrast  Result Date: 06/16/2016 CLINICAL DATA:  Persistent elevated white blood cell count. Postoperative day number gait after sigmoid colectomy and end of loop ileostomy for perforated diverticulitis. The patient is afebrile. EXAM: CT ABDOMEN AND PELVIS WITHOUT CONTRAST TECHNIQUE: Multidetector CT imaging of the abdomen and pelvis was performed following the standard protocol without IV contrast. COMPARISON:  CT abdomen and pelvis 06/08/2016. FINDINGS: The lung bases are clear. There is cardiomegaly. No pleural or pericardial effusion. Small hiatal hernia is noted. Calcific coronary artery disease is seen. Since the prior CT scan, the patient has undergone partial sigmoid colectomy and loop ileostomy placement. In the anterior aspect of the left pelvis immediately subjacent to the anterior abdominal wall musculature at the surgical site, there is a small fluid collection measuring 1.9 cm AP x 4.0 cm transverse x 4.2 cm craniocaudal. There is also small amount of free pelvic fluid and fluid about small bowel loops in the right lower quadrant. The left ovary is again identified and appears more prominent. There may be a small amount of fluid adjacent to the ovary. Small bowel loops are mildly dilated at 3.0 cm with some air-fluid levels present. Contrast extends into the patient's ileostomy. High attenuation material within the gallbladder is consistent with vicarious excretion of IV contrast from the patient's prior CT. The liver, adrenal glands, spleen, pancreas and kidneys are unremarkable. No lymphadenopathy is identified. No focal bony abnormality is seen. IMPRESSION: Status post partial sigmoid colectomy and loop ileostomy placement. Small fluid collection in the anterior left lower quadrant at the surgical site could be due to normal postoperative change, phlegmon or possibly early abscess. No drainable collection is identified. Increased fullness of the left ovary may be due to small amount of adjacent  fluid with a small volume of free pelvic fluid noted. Mild dilatation of small bowel with air-fluid levels compatible with ileus. No obstruction. Calcific coronary artery disease. Small hiatal hernia. Electronically Signed   By: Inge Rise M.D.   On: 06/16/2016 17:47     ASSESSMENT AND PLAN:   70 y.o. femalewith a known history of Hypertension, insulin-dependent diabetes mellitus, CK D, rheumatoid arthritis, hypothyroidism presents to the hospital secondary to worsening abdominal pain and noted to have perforated sigmoid diverticulitis.  * Acute kidney injury over CKD stage III - Cr. A bit improved since yesterday. -Continue TPN, LR. Edema nephrology and as per them likely prerenal in nature. -Improving. Renal dose meds avoid nephrotoxins, follow BUN and creatinine.  * Essential hypertension - BP improved.  - cont.  Lopressor, Norvasc, PRN hydralazine.   * Insulin-dependent diabetes mellitus - well controlled Patient is getting insulin in TPN, Lantus and scheduled NovoLog. Sliding scale insulin with Accu-Cheks every 4 hours  * Mild Acute on chronic chronic diastolic CHF  - resolved and clinically no in CHF.   * Rheumatoid arthritis-follows with rheumatology as outpatient  * Perforated sigmoid diverticulitis - s/p colostomy.  - cont. Care as per surgery.  Cont. Zosyn.  - WBC count slightly trending up and as per surgery may need to repeat CT abd/pelvis in next 2-3 days.   * Anemia of chronic disease-hemoglobin stable and follow. -Transfuse if hemoglobin falls less than 7.  All the records are reviewed and case discussed with Care Management/Social Worker. Management plans discussed with the patient, family and they are in agreement.  CODE STATUS: Full  DVT Prophylaxis: Heparin subcutaneous  TOTAL TIME TAKING CARE OF THIS PATIENT: 30 minutes.   POSSIBLE D/C IN 2-3 DAYS, DEPENDING ON CLINICAL CONDITION.   Henreitta Leber M.D on 06/18/2016 at 3:17 PM  Between  7am to 6pm - Pager - [phone]  After 6pm go to [email or web address] - Proofreader  [facility]  Office  [phone]  CC: Primary care physician; Glendon Axe, MD

## 2016-06-19 LAB — BASIC METABOLIC PANEL
Anion gap: 6 (ref 5–15)
BUN: 43 mg/dL — AB (ref 6–20)
CALCIUM: 8.5 mg/dL — AB (ref 8.9–10.3)
CO2: 22 mmol/L (ref 22–32)
CREATININE: 2.13 mg/dL — AB (ref 0.44–1.00)
Chloride: 111 mmol/L (ref 101–111)
GFR calc non Af Amer: 22 mL/min — ABNORMAL LOW (ref >60)
GFR, EST AFRICAN AMERICAN: 26 mL/min — AB (ref >60)
Glucose, Bld: 124 mg/dL — ABNORMAL HIGH (ref 65–99)
Potassium: 4.4 mmol/L (ref 3.5–5.1)
Sodium: 139 mmol/L (ref 135–145)

## 2016-06-19 LAB — GLUCOSE, CAPILLARY
GLUCOSE-CAPILLARY: 140 mg/dL — AB (ref 65–99)
Glucose-Capillary: 116 mg/dL — ABNORMAL HIGH (ref 65–99)
Glucose-Capillary: 119 mg/dL — ABNORMAL HIGH (ref 65–99)
Glucose-Capillary: 124 mg/dL — ABNORMAL HIGH (ref 65–99)
Glucose-Capillary: 193 mg/dL — ABNORMAL HIGH (ref 65–99)
Glucose-Capillary: 251 mg/dL — ABNORMAL HIGH (ref 65–99)

## 2016-06-19 LAB — MAGNESIUM: MAGNESIUM: 1.9 mg/dL (ref 1.7–2.4)

## 2016-06-19 LAB — PHOSPHORUS: Phosphorus: 3.3 mg/dL (ref 2.5–4.6)

## 2016-06-19 MED ORDER — TRACE MINERALS CR-CU-MN-SE-ZN 10-1000-500-60 MCG/ML IV SOLN
INTRAVENOUS | Status: AC
  Administered 2016-06-19: 13:00:00 via INTRAVENOUS
  Filled 2016-06-19: qty 960

## 2016-06-19 MED ORDER — OXYCODONE-ACETAMINOPHEN 5-325 MG PO TABS: 1.0000 | ORAL_TABLET | ORAL | Status: DC | PRN

## 2016-06-19 NOTE — Progress Notes (Signed)
PARENTERAL NUTRITION CONSULT NOTE - FOLLOW UP   Pharmacy Consult for TPN Indication: colon resection, NPO  Allergies  Allergen Reactions  . Doxycycline Other (See Comments)    Reaction: unknown  . Levaquin [Levofloxacin] Other (See Comments)    Causes joint pain  . Penicillins Rash and Other (See Comments)    Has patient had a PCN reaction causing immediate rash, facial/tongue/throat swelling, SOB or lightheadedness with hypotension: Yes Has patient had a PCN reaction causing severe rash involving mucus membranes or skin necrosis: No Has patient had a PCN reaction that required hospitalization No Has patient had a PCN reaction occurring within the last 10 years: No If all of the above answers are "NO", then may proceed with Cephalosporin use.  . Sulfa Antibiotics Swelling and Rash    Patient Measurements: Height: 5\' 5"  (165.1 cm) Weight: 224 lb 3.2 oz (101.7 kg) IBW/kg (Calculated) : 57   Vital Signs: Temp: 98.2 F (36.8 C) (08/26 0352) Temp Source: Oral (08/26 0352) BP: 159/59 (08/26 0352) Pulse Rate: 74 (08/26 0352) Intake/Output from previous day: 08/25 0701 - 08/26 0700 In: 4602.8 [P.O.:1260; I.V.:3222.7; IV Piggyback:120.1] Out: 3700 [Urine:2550; Stool:1150] Intake/Output from this shift: No intake/output data recorded.  Labs:  Recent Labs  06/17/16 0443 06/18/16 0438  WBC 15.5* 16.3*  HGB 7.6* 7.5*  HCT 22.6* 21.8*  PLT 328 374     Recent Labs  06/17/16 0443 06/18/16 0438 06/19/16 0545  NA 135 138 139  K 4.1 4.2 4.4  CL 106 109 111  CO2 20* 22 22  GLUCOSE 133* 151* 124*  BUN 66* 55* 43*  CREATININE 2.47* 2.29* 2.13*  CALCIUM 8.4* 8.6* 8.5*  MG 2.1 2.0 1.9  PHOS  --  3.7 3.3   Estimated Creatinine Clearance: 29.1 mL/min (by C-G formula based on SCr of 2.13 mg/dL).    Recent Labs  06/18/16 2359 06/19/16 0415 06/19/16 0756  GLUCAP 163* 119* 140*    Insulin Requirements in the past 24 hours:  +Hx of DM 21 units of SSI over last 24 hr    15 units of Lantus  daily 15 units Regular Insulin added to each bag of TPN   Pt on Lantus at home  Current Nutrition:  Clinimix E 5/20 with trace elements and MVI at 40 ml/hr. Soft diet (8/23)   Assessment: 70 yo female s/p colon resection surgery for diverticulitis on 8/16 started TPN 8/17. Today 8/26 is POD # 11  All electrolytes are within normal limits.  Plan:   Continue Clinimix E5/20 with trace and mvi @ 40 ml/hr with 15 units of regular insulin per bag.  Will continue moderate scale SSI. Will continue Lantus 15 units daily.   Per surgery note from yesterday 8/25- continue TPN one more day until PO intake improves.   Pharmacy will continue to monitor and adjust per consult.   Delvon Chipps A, PharmD 06/19/2016,8:18 AM

## 2016-06-19 NOTE — Progress Notes (Signed)
Order placed to pull penrose drain. Drain pulled from the bottom of wound, but drain was not found at the top. Dr Dahlia Byes notified and stated he would come take a look.

## 2016-06-19 NOTE — Progress Notes (Signed)
[facility] NAME: [name]    MR#:  [id number]  DATE OF BIRTH:  August 31, 1946  SUBJECTIVE:   Patient here due to abdominal pain from perforated sigmoid diverticulitis. Status post colostomy. Tolerating TPN. Afebrile, nausea improved.  Ostomy output stable.   REVIEW OF SYSTEMS:    Review of Systems  Constitutional: Negative for chills and fever.  HENT: Negative for congestion and tinnitus.   Eyes: Negative for blurred vision and double vision.  Respiratory: Negative for cough, shortness of breath and wheezing.   Cardiovascular: Negative for chest pain, orthopnea and PND.  Gastrointestinal: Positive for abdominal pain. Negative for diarrhea and nausea.  Genitourinary: Negative for dysuria and hematuria.  Neurological: Negative for dizziness, sensory change and focal weakness.  All other systems reviewed and are negative.   Nutrition: Soft Diet Tolerating Diet: yes Tolerating PT: Eval noted.    DRUG ALLERGIES:   Allergies  Allergen Reactions  . Doxycycline Other (See Comments)    Reaction: unknown  . Levaquin [Levofloxacin] Other (See Comments)    Causes joint pain  . Penicillins Rash and Other (See Comments)    Has patient had a PCN reaction causing immediate rash, facial/tongue/throat swelling, SOB or lightheadedness with hypotension: Yes Has patient had a PCN reaction causing severe rash involving mucus membranes or skin necrosis: No Has patient had a PCN reaction that required hospitalization No Has patient had a PCN reaction occurring within the last 10 years: No If all of the above answers are "NO", then may proceed with Cephalosporin use.  . Sulfa Antibiotics Swelling and Rash    VITALS:  Blood pressure (!) 156/60, pulse 85, temperature 98.2 F (36.8 C), temperature source Oral, resp. rate 16, height 5\' 5"  (1.651 m), weight 101.7 kg (224 lb 3.2 oz), SpO2 100 %.  PHYSICAL EXAMINATION:   Physical Exam  GENERAL:   70 y.o.-year-old patient lying in bed in no acute distress.  EYES: Pupils equal, round, reactive to light and accommodation. No scleral icterus. Extraocular muscles intact.  HEENT: Head atraumatic, normocephalic. Oropharynx and nasopharynx clear.  NECK:  Supple, no jugular venous distention. No thyroid enlargement, no tenderness.  LUNGS: Normal breath sounds bilaterally, no wheezing, rales, rhonchi. No use of accessory muscles of respiration.  CARDIOVASCULAR: S1, S2 normal. No murmurs, rubs, or gallops.  ABDOMEN: Soft, nontender, nondistended. + colostomy with liquid stool.  MId-abdomen dressing w/out acute drainage.  Bowel sounds present. No organomegaly or mass.  EXTREMITIES: No cyanosis, clubbing or edema b/l.    NEUROLOGIC: Cranial nerves II through XII are intact. No focal Motor or sensory deficits b/l.   PSYCHIATRIC: The patient is alert and oriented x 3.  SKIN: No obvious rash, lesion, or ulcer.    LABORATORY PANEL:   CBC  Recent Labs Lab 06/18/16 0438  WBC 16.3*  HGB 7.5*  HCT 21.8*  PLT 374   ------------------------------------------------------------------------------------------------------------------  Chemistries   Recent Labs Lab 06/16/16 0501  06/19/16 0545  NA 136  < > 139  K 3.9  < > 4.4  CL 107  < > 111  CO2 22  < > 22  GLUCOSE 155*  < > 124*  BUN 64*  < > 43*  CREATININE 2.39*  < > 2.13*  CALCIUM 8.5*  < > 8.5*  MG 1.8  < > 1.9  AST 20  --   --   ALT 14  --   --   ALKPHOS 62  --   --  BILITOT 0.3  --   --   < > = values in this interval not displayed. ------------------------------------------------------------------------------------------------------------------  Cardiac Enzymes No results for input(s): TROPONINI in the last 168 hours. ------------------------------------------------------------------------------------------------------------------  RADIOLOGY:  No results found.   ASSESSMENT AND PLAN:   69 y.o. femalewith a known  history of Hypertension, insulin-dependent diabetes mellitus, CK D, rheumatoid arthritis, hypothyroidism presents to the hospital secondary to worsening abdominal pain and noted to have perforated sigmoid diverticulitis.  * Acute kidney injury over CKD stage III - Cr trending down and improving. -Continue TPN, LR. Appreciate nephrology input and as per them likely prerenal in nature. -Improving. Renal dose meds avoid nephrotoxins, follow BUN and creatinine.  * Essential hypertension - BP improved.  - cont.  Lopressor, Norvasc, PRN hydralazine.   * Insulin-dependent diabetes mellitus - well controlled Patient is getting insulin in TPN, Lantus and scheduled NovoLog. - cont. SSI.    * Mild Acute on chronic chronic diastolic CHF  - resolved and clinically not in CHF.   * Rheumatoid arthritis-follows with rheumatology as outpatient  * Perforated sigmoid diverticulitis - s/p colostomy. Ostomy output stable.  - cont. Care as per surgery.  Cont. Zosyn.  - WBC count stable, no plans for repeat CT abdomen pelvis as per surgery for now.   * Anemia of chronic disease-hemoglobin stable and follow. -Transfuse if hemoglobin falls less than 7.  Possible d/c to rehab early next week.   All the records are reviewed and case discussed with Care Management/Social Worker. Management plans discussed with the patient, family and they are in agreement.  CODE STATUS: Full  DVT Prophylaxis: Heparin subcutaneous  TOTAL TIME TAKING CARE OF THIS PATIENT: 25 minutes.   POSSIBLE D/C IN 2-3 DAYS, DEPENDING ON CLINICAL CONDITION.   Henreitta Leber M.D on 06/19/2016 at 1:20 PM  Between 7am to 6pm - Pager - [phone]  After 6pm go to [email or web address] - Proofreader  [facility]  Office  [phone]  CC: Primary care physician; Glendon Axe, MD

## 2016-06-19 NOTE — Progress Notes (Signed)
Feeling better Taking more PO AVSS Creat improving  PE NAD Abd: staplesin place, soft, NT, no peritonitis, ostomy working  A/P doing well PT Mobilize Do not renew TPN Continue A/BS DC central line once TPN is off placment early next week to rehab AKI improving

## 2016-06-20 LAB — CBC
HCT: 21 % — ABNORMAL LOW (ref 35.0–47.0)
HEMOGLOBIN: 7.2 g/dL — AB (ref 12.0–16.0)
MCH: 29.5 pg (ref 26.0–34.0)
MCHC: 34.5 g/dL (ref 32.0–36.0)
MCV: 85.7 fL (ref 80.0–100.0)
Platelets: 446 10*3/uL — ABNORMAL HIGH (ref 150–440)
RBC: 2.45 MIL/uL — ABNORMAL LOW (ref 3.80–5.20)
RDW: 15.1 % — ABNORMAL HIGH (ref 11.5–14.5)
WBC: 16 10*3/uL — ABNORMAL HIGH (ref 3.6–11.0)

## 2016-06-20 LAB — BASIC METABOLIC PANEL
Anion gap: 7 (ref 5–15)
BUN: 34 mg/dL — AB (ref 6–20)
CHLORIDE: 111 mmol/L (ref 101–111)
CO2: 21 mmol/L — ABNORMAL LOW (ref 22–32)
CREATININE: 2.06 mg/dL — AB (ref 0.44–1.00)
Calcium: 9 mg/dL (ref 8.9–10.3)
GFR calc Af Amer: 27 mL/min — ABNORMAL LOW (ref >60)
GFR calc non Af Amer: 23 mL/min — ABNORMAL LOW (ref >60)
Glucose, Bld: 85 mg/dL (ref 65–99)
Potassium: 4.4 mmol/L (ref 3.5–5.1)
SODIUM: 139 mmol/L (ref 135–145)

## 2016-06-20 LAB — GLUCOSE, CAPILLARY
GLUCOSE-CAPILLARY: 82 mg/dL (ref 65–99)
GLUCOSE-CAPILLARY: 162 mg/dL — AB (ref 65–99)
GLUCOSE-CAPILLARY: 112 mg/dL — AB (ref 65–99)
Glucose-Capillary: 133 mg/dL — ABNORMAL HIGH (ref 65–99)

## 2016-06-20 LAB — MAGNESIUM: Magnesium: 1.9 mg/dL (ref 1.7–2.4)

## 2016-06-20 LAB — PHOSPHORUS: PHOSPHORUS: 2.9 mg/dL (ref 2.5–4.6)

## 2016-06-20 NOTE — Progress Notes (Signed)
POD # 11 sigmoid colectomy w anastomosis and diverting loop Feeling much better Taking  PO, NO N/V AVSS Creat improving Persistent ele wbc  PE NAD Abd: staplesin place, soft, NT, no peritonitis, ostomy working  A/P doing well PT Mobilize Line DCed today If persistent WBC may need to rescan tomorrow. She clinically has made significant improvement so I think her WBC might be related to the central line. We will obtain [facility] Continue zosyn

## 2016-06-20 NOTE — Progress Notes (Signed)
[facility] [place] NAME: [name]    MR#:  [id number]  DATE OF BIRTH:  1946-09-03  SUBJECTIVE:   Patient here due to abdominal pain from perforated sigmoid diverticulitis. No acute events overnight. No complaints.    REVIEW OF SYSTEMS:    Review of Systems  Constitutional: Negative for chills and fever.  HENT: Negative for congestion and tinnitus.   Eyes: Negative for blurred vision and double vision.  Respiratory: Negative for cough, shortness of breath and wheezing.   Cardiovascular: Negative for chest pain, orthopnea and PND.  Gastrointestinal: Positive for abdominal pain. Negative for diarrhea and nausea.  Genitourinary: Negative for dysuria and hematuria.  Neurological: Negative for dizziness, sensory change and focal weakness.  All other systems reviewed and are negative.   Nutrition: Soft Diet Tolerating Diet: yes Tolerating PT: Eval noted.    DRUG ALLERGIES:   Allergies  Allergen Reactions  . Doxycycline Other (See Comments)    Reaction: unknown  . Levaquin [Levofloxacin] Other (See Comments)    Causes joint pain  . Penicillins Rash and Other (See Comments)    Has patient had a PCN reaction causing immediate rash, facial/tongue/throat swelling, SOB or lightheadedness with hypotension: Yes Has patient had a PCN reaction causing severe rash involving mucus membranes or skin necrosis: No Has patient had a PCN reaction that required hospitalization No Has patient had a PCN reaction occurring within the last 10 years: No If all of the above answers are "NO", then may proceed with Cephalosporin use.  . Sulfa Antibiotics Swelling and Rash    VITALS:  Blood pressure (!) 145/66, pulse 70, temperature 98.3 F (36.8 C), temperature source Oral, resp. rate 20, height 5\' 5"  (1.651 m), weight 104.1 kg (229 lb 8 oz), SpO2 100 %.  PHYSICAL EXAMINATION:   Physical Exam  GENERAL:  70 y.o.-year-old patient lying in bed in no  acute distress.  EYES: Pupils equal, round, reactive to light and accommodation. No scleral icterus. Extraocular muscles intact.  HEENT: Head atraumatic, normocephalic. Oropharynx and nasopharynx clear.  NECK:  Supple, no jugular venous distention. No thyroid enlargement, no tenderness.  LUNGS: Normal breath sounds bilaterally, no wheezing, rales, rhonchi. No use of accessory muscles of respiration.  CARDIOVASCULAR: S1, S2 normal. No murmurs, rubs, or gallops.  ABDOMEN: Soft, nontender, nondistended. + colostomy with liquid stool.  MId-abdomen dressing w/out acute drainage.  Bowel sounds present. No organomegaly or mass.  EXTREMITIES: No cyanosis, clubbing or edema b/l.    NEUROLOGIC: Cranial nerves II through XII are intact. No focal Motor or sensory deficits b/l.   PSYCHIATRIC: The patient is alert and oriented x 3.  SKIN: No obvious rash, lesion, or ulcer.    LABORATORY PANEL:   CBC  Recent Labs Lab 06/20/16 0241  WBC 16.0*  HGB 7.2*  HCT 21.0*  PLT 446*   ------------------------------------------------------------------------------------------------------------------  Chemistries   Recent Labs Lab 06/16/16 0501  06/20/16 0241  NA 136  < > 139  K 3.9  < > 4.4  CL 107  < > 111  CO2 22  < > 21*  GLUCOSE 155*  < > 85  BUN 64*  < > 34*  CREATININE 2.39*  < > 2.06*  CALCIUM 8.5*  < > 9.0  MG 1.8  < > 1.9  AST 20  --   --   ALT 14  --   --   ALKPHOS 62  --   --   BILITOT 0.3  --   --   < > =  values in this interval not displayed. ------------------------------------------------------------------------------------------------------------------  Cardiac Enzymes No results for input(s): TROPONINI in the last 168 hours. ------------------------------------------------------------------------------------------------------------------  RADIOLOGY:  No results found.   ASSESSMENT AND PLAN:   70 y.o. femalewith a known history of Hypertension, insulin-dependent  diabetes mellitus, CK D, rheumatoid arthritis, hypothyroidism presents to the hospital secondary to worsening abdominal pain and noted to have perforated sigmoid diverticulitis.  * Acute kidney injury over CKD stage III - Cr trending down and close to baseline.  -Continue TPN, LR. Appreciate nephrology input and as per them likely prerenal in nature. -Renal dose meds avoid nephrotoxins, follow BUN and creatinine. - follow up with Nephrology as outpatient.   * Essential hypertension - BP improved.  - cont.  Lopressor, Norvasc, PRN hydralazine.   * Insulin-dependent diabetes mellitus - well controlled Patient is getting insulin in TPN, Lantus and scheduled NovoLog. - cont. SSI.    * Mild Acute on chronic chronic diastolic CHF  - resolved and clinically not in CHF.   * Rheumatoid arthritis-follows with rheumatology as outpatient  * Perforated sigmoid diverticulitis - s/p colostomy. Ostomy output stable.  - cont. Care as per surgery.  Cont. Zosyn.  - WBC count still slightly elevated, plan for repeat CT abdomen pelvis next week if WBC Count remains elevated.   * Anemia of chronic disease-hemoglobin stable and follow. -Transfuse if hemoglobin falls less than 7.  Discussed w/ Dr. Dahlia Byes and will sign off for now.  Please call back if any further help needed.   All the records are reviewed and case discussed with Care Management/Social Worker. Management plans discussed with the patient, family and they are in agreement.  CODE STATUS: Full  DVT Prophylaxis: Heparin subcutaneous  TOTAL TIME TAKING CARE OF THIS PATIENT: 25 minutes.    Henreitta Leber M.D on 06/20/2016 at 1:56 PM  Between 7am to 6pm - Pager - [phone]  After 6pm go to [email or web address] - Proofreader  [facility]  Office  [phone]  CC: Primary care physician; Glendon Axe, MD

## 2016-06-21 LAB — GLUCOSE, CAPILLARY
GLUCOSE-CAPILLARY: 131 mg/dL — AB (ref 65–99)
GLUCOSE-CAPILLARY: 79 mg/dL (ref 65–99)
GLUCOSE-CAPILLARY: 166 mg/dL — AB (ref 65–99)
Glucose-Capillary: 119 mg/dL — ABNORMAL HIGH (ref 65–99)
Glucose-Capillary: 166 mg/dL — ABNORMAL HIGH (ref 65–99)
Glucose-Capillary: 193 mg/dL — ABNORMAL HIGH (ref 65–99)
Glucose-Capillary: 158 mg/dL — ABNORMAL HIGH (ref 65–99)

## 2016-06-21 LAB — COMPREHENSIVE METABOLIC PANEL
ALBUMIN: 2.6 g/dL — AB (ref 3.5–5.0)
ALK PHOS: 68 U/L (ref 38–126)
ALT: 16 U/L (ref 14–54)
AST: 22 U/L (ref 15–41)
Anion gap: 5 (ref 5–15)
BILIRUBIN TOTAL: 0.4 mg/dL (ref 0.3–1.2)
BUN: 27 mg/dL — AB (ref 6–20)
CALCIUM: 9.1 mg/dL (ref 8.9–10.3)
CO2: 24 mmol/L (ref 22–32)
Chloride: 110 mmol/L (ref 101–111)
Creatinine, Ser: 2.22 mg/dL — ABNORMAL HIGH (ref 0.44–1.00)
GFR calc Af Amer: 25 mL/min — ABNORMAL LOW (ref >60)
GFR calc non Af Amer: 21 mL/min — ABNORMAL LOW (ref >60)
GLUCOSE: 94 mg/dL (ref 65–99)
Potassium: 4.4 mmol/L (ref 3.5–5.1)
SODIUM: 139 mmol/L (ref 135–145)
TOTAL PROTEIN: 7.1 g/dL (ref 6.5–8.1)

## 2016-06-21 LAB — CBC
HCT: 22 % — ABNORMAL LOW (ref 35.0–47.0)
HEMOGLOBIN: 7.5 g/dL — AB (ref 12.0–16.0)
MCH: 29.2 pg (ref 26.0–34.0)
MCHC: 34 g/dL (ref 32.0–36.0)
MCV: 85.8 fL (ref 80.0–100.0)
Platelets: 483 10*3/uL — ABNORMAL HIGH (ref 150–440)
RBC: 2.56 MIL/uL — ABNORMAL LOW (ref 3.80–5.20)
RDW: 14.9 % — AB (ref 11.5–14.5)
WBC: 13.4 10*3/uL — ABNORMAL HIGH (ref 3.6–11.0)

## 2016-06-21 LAB — MAGNESIUM: MAGNESIUM: 1.7 mg/dL (ref 1.7–2.4)

## 2016-06-21 NOTE — Progress Notes (Signed)
Physical Therapy Treatment Patient Details Name: [name] MRN: [id number] DOB: 07-02-1946 Today's Date: 08-Jul-2016    History of Present Illness Pt is a 70 y/o female that presents with abdominal pain, found to have perforated sigmoid diverticulitis, operated on and now has colostomy bag. She has continued to have difficulty with nausea, x-rays show likely ileus, treated with NG tube.     PT Comments    Pt in bed agreeable to therapy. Performed supine to sit at EOB to left with supervision requiring additional time and use of bed rail.  Pt able to ambulate 140ft x 1 with RW with CGA, after seated rest pt able to tolerate seated therex with occasional breaks.    Follow Up Recommendations  SNF     Equipment Recommendations  Rolling walker with 5" wheels    Recommendations for Other Services       Precautions / Restrictions Restrictions Weight Bearing Restrictions: No    Mobility  Bed Mobility Overal bed mobility: Modified Independent;Needs Assistance Bed Mobility: Supine to Sit     Supine to sit: Supervision     General bed mobility comments: HOB raised, use of features, add'l time required  Transfers Overall transfer level: Needs assistance Equipment used: Rolling walker (2 wheeled) Transfers: Sit to/from Stand Sit to Stand: Min guard         General transfer comment: Demonstrated good safety with transfer, slow cautions movements   Ambulation/Gait                 Stairs            Wheelchair Mobility    Modified Rankin (Stroke Patients Only)       Balance                                    Cognition Arousal/Alertness: Awake/alert Behavior During Therapy: WFL for tasks assessed/performed Overall Cognitive Status: Within Functional Limits for tasks assessed                      Exercises Total Joint Exercises Ankle Circles/Pumps: AROM;Both;15 reps;Seated Hip ABduction/ADduction: AROM;Both;15  reps;Seated Long Arc Quad: AROM;Both;15 reps;Seated    General Comments        Pertinent Vitals/Pain Pain Assessment: No/denies pain    Home Living                      Prior Function            PT Goals (current goals can now be found in the care plan section) Progress towards PT goals: Progressing toward goals    Frequency  Min 2X/week    PT Plan Current plan remains appropriate    Co-evaluation             End of Session Equipment Utilized During Treatment: Gait belt Activity Tolerance: Patient tolerated treatment well Patient left: in chair;with call bell/phone within reach;with chair alarm set     Time: 1105-1130 PT Time Calculation (min) (ACUTE ONLY): 25 min  Charges:  $Therapeutic Exercise: 8-22 mins $Therapeutic Activity: 8-22 mins                    G Codes:      Ailani Governale Jul 08, 2016, 12:32 PM

## 2016-06-21 NOTE — Consult Note (Signed)
   [facility] CM Inpatient Consult   06/21/2016  [name] 09-10-1946 [id number]  Patient screened for potential [facility] Management services. Patient is eligible for [facility]. Electronic medical record reveals patient's discharge plan is SNF. [facility] Care Management services not appropriate at this time. If patient's post hospital needs change please place a [facility] Care Management consult. For questions please contact:   Yvonnie Schinke RN, [facility] Liaison  [phone]) [employer] [phone]) Toll free office

## 2016-06-21 NOTE — Progress Notes (Signed)
70 yr old female POD#12 from Sigmoid colectomy with anastomosis and diverting loop ilosotomy.  Patient states doing much better today.  She is feeling well and eating breakfast this AM. She has been having ouput from ostomy and air. She states pain well controlled.  She has been working with PT who recommend SNF placement and patient ready to go to rehab.   Vitals:   06/21/16 0443 06/21/16 1238  BP: (!) 155/61 (!) 150/60  Pulse: 74 72  Resp: 18 18  Temp: 98.2 F (36.8 C) 98.4 F (36.9 C)   I/O last 3 completed shifts: In: 2689.5 [P.O.:430; I.V.:2010.5; IV Piggyback:249] Out: [id number] [Urine:3650; [id number] Total I/O In: 1015 [P.O.:440; I.V.:425; IV Piggyback:150] Out: 1500 [Urine:1000; Stool:500]   PE: Gen: NAD Abd: soft, appropriately tender, midline incision c/d/i no erythema, minimal serous drainage from umbilical site, ileostomy in RLQ with stool and air in bag  CBC Latest Ref Rng & Units 06/21/2016 06/20/2016 06/18/2016  WBC 3.6 - 11.0 K/uL 13.4(H) 16.0(H) 16.3(H)  Hemoglobin 12.0 - 16.0 g/dL 7.5(L) 7.2(L) 7.5(L)  Hematocrit 35.0 - 47.0 % 22.0(L) 21.0(L) 21.8(L)  Platelets 150 - 440 K/uL 483(H) 446(H) 374   CMP Latest Ref Rng & Units 06/21/2016 06/20/2016 06/19/2016  Glucose 65 - 99 mg/dL 94 85 124(H)  BUN 6 - 20 mg/dL 27(H) 34(H) 43(H)  Creatinine 0.44 - 1.00 mg/dL 2.22(H) 2.06(H) 2.13(H)  Sodium 135 - 145 mmol/L 139 139 139  Potassium 3.5 - 5.1 mmol/L 4.4 4.4 4.4  Chloride 101 - 111 mmol/L 110 111 111  CO2 22 - 32 mmol/L 24 21(L) 22  Calcium 8.9 - 10.3 mg/dL 9.1 9.0 8.5(L)  Total Protein 6.5 - 8.1 g/dL 7.1 - -  Total Bilirubin 0.3 - 1.2 mg/dL 0.4 - -  Alkaline Phos 38 - 126 U/L 68 - -  AST 15 - 41 U/L 22 - -  ALT 14 - 54 U/L 16 - -   A/P: 70 yr old female POD#12 from Sigmoid colectomy with anastomosis and diverting loop ilosotomy.   Pain: Percocet prn GI: Diabetic diet, good ostomy output DM: continue SSI and lantus  Leukocytosis: ? From central line, which has been  d/c'd, WBC down today, continue Zosyn, will recheck tomorrow and if continued to trend down will tx to po antibiotic and to rehab tomorrow PT: continue tx, recommending SNF, likely there tomorrow.

## 2016-06-21 NOTE — Clinical Social Work Note (Addendum)
Physician updated CSW and stated that patient will discharge to [facility] tomorrow. [facility] has been updated. Amy at [facility] is aware as well and Josem Kaufmann has been received for STR: [id number]. [name] Leff MSW,LCSW [phone]

## 2016-06-21 NOTE — Care Management Important Message (Signed)
Important Message  Patient Details  Name: [name] MRN: [id number] Date of Birth: 1946/06/15   Medicare Important Message Given:  Yes    Beverly Sessions, RN 06/21/2016, 5:06 PM

## 2016-06-22 LAB — CBC
HEMATOCRIT: 20.8 % — AB (ref 35.0–47.0)
HEMOGLOBIN: 7 g/dL — AB (ref 12.0–16.0)
MCH: 29.4 pg (ref 26.0–34.0)
MCHC: 33.9 g/dL (ref 32.0–36.0)
MCV: 86.8 fL (ref 80.0–100.0)
Platelets: 479 10*3/uL — ABNORMAL HIGH (ref 150–440)
RBC: 2.39 MIL/uL — ABNORMAL LOW (ref 3.80–5.20)
RDW: 14.8 % — AB (ref 11.5–14.5)
WBC: 12.5 10*3/uL — AB (ref 3.6–11.0)

## 2016-06-22 LAB — GLUCOSE, CAPILLARY
GLUCOSE-CAPILLARY: 65 mg/dL (ref 65–99)
GLUCOSE-CAPILLARY: 107 mg/dL — AB (ref 65–99)
GLUCOSE-CAPILLARY: 203 mg/dL — AB (ref 65–99)
GLUCOSE-CAPILLARY: 153 mg/dL — AB (ref 65–99)
Glucose-Capillary: 53 mg/dL — ABNORMAL LOW (ref 65–99)
Glucose-Capillary: 65 mg/dL (ref 65–99)
Glucose-Capillary: 80 mg/dL (ref 65–99)

## 2016-06-22 MED ORDER — IBUPROFEN 800 MG PO TABS: 800.0000 mg | ORAL_TABLET | Freq: Three times a day (TID) | ORAL | 0 refills | Status: AC | PRN

## 2016-06-22 MED ORDER — AMOXICILLIN-POT CLAVULANATE 875-125 MG PO TABS: 1.0000 | ORAL_TABLET | Freq: Two times a day (BID) | ORAL | 0 refills | Status: DC

## 2016-06-22 NOTE — Clinical Social Work Note (Signed)
Patient to be d/c'ed today to [facility].  Patient and family agreeable to plans will transport via daughter's personal car RN to call report to [phone] call report to room 508.  Evette Cristal, MSW Mon-Fri 8a-4:30p [phone]

## 2016-06-22 NOTE — Progress Notes (Addendum)
Report given by Lily Peer, RN to receiving nurse at [facility], Daughter is made aware of pt discharge. Daughter is coming to take pt to [facility].   Angus Seller

## 2016-06-22 NOTE — Progress Notes (Signed)
Pt discharged with daughter, who is taking pt to [facility]. Discharge instructions with over with daughter and packet for [facility] handed to daughter also. IV removed. Pt escorted out by NT.   Angus Seller

## 2016-06-22 NOTE — Progress Notes (Addendum)
Inpatient Diabetes Program Recommendations  AACE/ADA: New Consensus Statement on Inpatient Glycemic Control (2015)  Target Ranges:  Prepandial:   less than 140 mg/dL      [facility] postprandial:   less than 180 mg/dL (1-2 hours)      Critically ill patients:  140 - 180 mg/dL   Results for SABRI, BRIDWELL (MRN [id number]) as of 06/22/2016 08:58  Ref. Range 06/22/2016 00:13 06/22/2016 02:21 06/22/2016 02:44 06/22/2016 03:04 06/22/2016 03:39 06/22/2016 07:50  Glucose-Capillary Latest Ref Range: 65 - 99 mg/dL 80 53 (L) 65 65 107 (H) 203 (H)    Home DM Meds: Lantus 25 units QHS       Novolog 12 units tidwc       Actos 15 mg daily  Current Insulin Orders: Lantus 15 units daily      Novolog Moderate Correction Scale/ SSI (0-15 units) Q4 hours    MD- Note patient with Hypoglycemia at 2am today.  Also note TPN stopped on 06/19/16.  Please consider the following insulin adjustments:  1. Reduce Lantus slightly to 13 units daily  2. Reduce Novolog SSI to Sensitive scale (0-9 units) Q4 hours     --Will follow patient during hospitalization--  Wyn Quaker RN, MSN, CDE Diabetes Coordinator Inpatient Glycemic Control Team Team Pager: [phone] (8a-5p)

## 2016-06-22 NOTE — Discharge Summary (Signed)
Physician Discharge Summary  Patient ID: [name] MRN: [id number] DOB/AGE: [birth year] 70 y.o.  Admit date: 06/08/2016 Discharge date: 06/22/2016  Admission Diagnoses:  Diverticulitis   Discharge Diagnoses:  Active Problems:   Diverticulitis   Discharged Condition: fair  Hospital Course: 70 yr old with perf sigmoid diverticulitis.  Patient has ex lap with colonoic resection, anastomosiss on diverting ileostomy.  She has had a prolonged course in the hospital and had a central line infection.  She is doing well, tolerating diet.  She has worked with PT and they recommend SNF placement.  We will send her on antibiotics for 7 more days  Consults: medicine  Significant Diagnostic Studies: CT scan  Treatments: antibiotics: Zosyn and surgery: see above  Discharge Exam: Blood pressure (!) 152/64, pulse 81, temperature 98.4 F (36.9 C), temperature source Oral, resp. rate 19, height 5\' 5"  (1.651 m), weight 221 lb 11.2 oz (100.6 kg), SpO2 99 %. General appearance: alert, cooperative and no distress GI: soft, appropriately tender, incisions clean/dry, intact, ileostomy pink and patent Extremities: extremities normal, atraumatic, no cyanosis or edema  Disposition: 01-Home or Self Care     Medication List    TAKE these medications   acetaminophen 500 MG tablet Commonly known as:  TYLENOL Take 500 mg by mouth every 6 (six) hours as needed for mild pain, fever or headache.   amLODipine 10 MG tablet Commonly known as:  NORVASC Take 10 mg by mouth daily.   amoxicillin-clavulanate 875-125 MG tablet Commonly known as:  AUGMENTIN Take 1 tablet by mouth 2 (two) times daily.   aspirin 81 MG tablet Take 81 mg by mouth 2 (two) times daily.   atorvastatin 40 MG tablet Commonly known as:  LIPITOR Take 40 mg by mouth daily.   furosemide 20 MG tablet Commonly known as:  LASIX Take 40 mg by mouth daily.   gabapentin 100 MG capsule Commonly known as:  NEURONTIN Take 100 mg by  mouth 3 (three) times daily.   hydrALAZINE 25 MG tablet Commonly known as:  APRESOLINE Take 25 mg by mouth 2 (two) times daily. Take another 25mg  if Systolic consistently over Q000111Q or dystolic over 90   hydrochlorothiazide 25 MG tablet Commonly known as:  HYDRODIURIL Take 25 mg by mouth daily.   insulin aspart 100 UNIT/ML injection Commonly known as:  novoLOG Inject 12 Units into the skin 3 (three) times daily.   insulin glargine 100 UNIT/ML injection Commonly known as:  LANTUS Inject 0.25 mLs (25 Units total) into the skin at bedtime.   levocetirizine 5 MG tablet Commonly known as:  XYZAL Take 5 mg by mouth every evening.   lisinopril 20 MG tablet Commonly known as:  PRINIVIL,ZESTRIL Take 20 mg by mouth daily.   LORazepam 0.5 MG tablet Commonly known as:  ATIVAN Take 1 tablet (0.5 mg total) by mouth every 8 (eight) hours as needed for anxiety.   metoprolol 50 MG tablet Commonly known as:  LOPRESSOR Take 50 mg by mouth 2 (two) times daily.   omeprazole 20 MG capsule Commonly known as:  PRILOSEC Take 20 mg by mouth daily.   pioglitazone 15 MG tablet Commonly known as:  ACTOS Take 15 mg by mouth daily.   potassium chloride 10 MEQ tablet Commonly known as:  K-DUR Take 10 mEq by mouth daily.   predniSONE 5 MG tablet Commonly known as:  DELTASONE Take 2.5 mg by mouth daily.        Signed: [name] 06/22/2016, 11:01 AM

## 2016-06-25 LAB — CULTURE, BLOOD (ROUTINE X 2)
CULTURE: NO GROWTH
Culture: NO GROWTH

## 2016-06-29 ENCOUNTER — Telehealth: Payer: Self-pay | Admitting: Surgery

## 2016-06-29 NOTE — Telephone Encounter (Signed)
Patients daughter is now calling. She is very concerned because the bag has come off again and is leaking into the surgical site. Patients skin is very raw. She would like patient to be seen today. Can Dr Adonis Huguenin come over to the office this afternoon for a wound check or should patient go to the ER? Please call and advise.

## 2016-06-29 NOTE — Telephone Encounter (Signed)
[name], nurse with [employer] would like to speak with [employer].  Patient had Colon Resection, Colostomy with Dr Pat Patrick on 06/09/16.  The ileostomy bag will not stay on, leaking into surgical site. Please call and advise.

## 2016-06-29 NOTE — Telephone Encounter (Signed)
Called patient's daughter at this time and told her that we had contacted Domenic Moras (RN) our Ileostomy nurse. I told her daughter that Santiago Glad will be contacting her to help her with questions and care for her wound. Patients daughter understood and had no further questions.

## 2016-07-07 ENCOUNTER — Ambulatory Visit (INDEPENDENT_AMBULATORY_CARE_PROVIDER_SITE_OTHER): Payer: Commercial Managed Care - HMO | Admitting: General Surgery

## 2016-07-07 ENCOUNTER — Encounter: Payer: Self-pay | Admitting: General Surgery

## 2016-07-07 VITALS — BP 118/66 | HR 79 | Temp 97.8°F

## 2016-07-07 DIAGNOSIS — Z4889 Encounter for other specified surgical aftercare: Secondary | ICD-10-CM

## 2016-07-07 DIAGNOSIS — M316 Other giant cell arteritis: Secondary | ICD-10-CM | POA: Insufficient documentation

## 2016-07-07 DIAGNOSIS — K5732 Diverticulitis of large intestine without perforation or abscess without bleeding: Secondary | ICD-10-CM

## 2016-07-07 DIAGNOSIS — Z932 Ileostomy status: Secondary | ICD-10-CM

## 2016-07-07 NOTE — Progress Notes (Signed)
Outpatient Surgical Follow Up  07/07/2016  [name] is an 70 y.o. female.   Chief Complaint  Patient presents with  . Routine Post Op    COLON RESECTION SIGMOID 06/09/2016 Dr. Pat Patrick    HPI: 70 year old female returns to clinic for week status post colon resection for diverticulitis. Patient reports severe fatigue but has otherwise been eating well and having ostomy function. She is currently in a skilled nursing facility where they've been having extreme difficulty maintaining appropriate seal on her ileostomy. She currently states she's had some nausea but she thinks is secondary to what she eats today. Prior to eating at the facility today she had no nausea. She denies any chest pain, shortness breath, fevers, chills. She is very interested in having her ostomy reversed as soon as possible.  Past Medical History:  Diagnosis Date  . Arthritis    Inflammatory  . CHF (congestive heart failure) ([place])   . Chronic renal insufficiency February 07, 2015  . Coronary artery dilation ([place])   . Diabetes mellitus without complication ([place])    Type II  . Hyperlipidemia   . Hypertension   . Renal insufficiency   . Sinus problem   . Thyroid disease   . Ulcer of left lower leg [facility]) February 07, 2015    Past Surgical History:  Procedure Laterality Date  . ABDOMINAL HYSTERECTOMY    . CATARACT EXTRACTION W/PHACO Right 03/11/2015   Procedure: CATARACT EXTRACTION PHACO AND INTRAOCULAR LENS PLACEMENT (IOC);  Surgeon: Birder Robson, MD;  Location: [facility] ORS;  Service: Ophthalmology;  Laterality: Right;  [place] 00:40 AP% 24.3 CDE 9.86  . CENTRAL VENOUS CATHETER INSERTION Right 06/09/2016   Procedure: INSERTION CENTRAL LINE ADULT;  Surgeon: Dia Crawford III, MD;  Location: [facility] ORS;  Service: General;  Laterality: Right;  . COLON RESECTION SIGMOID N/A 06/09/2016   Procedure: COLON RESECTION SIGMOID;  Surgeon: Dia Crawford III, MD;  Location: [facility] ORS;  Service: General;  Laterality: N/A;  . COLOSTOMY N/A  06/09/2016   Procedure: COLOSTOMY;  Surgeon: Dia Crawford III, MD;  Location: [facility] ORS;  Service: General;  Laterality: N/A;  . CYST EXCISION    . DILATION AND CURETTAGE OF UTERUS  1984  . I&D EXTREMITY Right 09/04/2015   Procedure: IRRIGATION AND DEBRIDEMENT EXTREMITY/ AND BIOPSY;  Surgeon: Algernon Huxley, MD;  Location: [facility] ORS;  Service: Vascular;  Laterality: Right;  . PARTIAL HYSTERECTOMY  1985  . ROTATOR CUFF REPAIR  2004    Family History  Problem Relation Age of Onset  . Diabetes Mellitus II Mother   . Diabetes Mellitus II Father     Social History:  reports that she has quit smoking. She quit after 45.00 years of use. She has never used smokeless tobacco. She reports that she does not drink alcohol or use drugs.  Allergies:  Allergies  Allergen Reactions  . Doxycycline Other (See Comments)    Reaction: unknown  . Levaquin [Levofloxacin] Other (See Comments)    Causes joint pain  . Penicillins Rash and Other (See Comments)    Has patient had a PCN reaction causing immediate rash, facial/tongue/throat swelling, SOB or lightheadedness with hypotension: Yes Has patient had a PCN reaction causing severe rash involving mucus membranes or skin necrosis: No Has patient had a PCN reaction that required hospitalization No Has patient had a PCN reaction occurring within the last 10 years: No If all of the above answers are "NO", then may proceed with Cephalosporin use.  . Sulfa Antibiotics Swelling  and Rash    Medications reviewed.    ROS A multipoint review of systems was completed. All pertinent positives and negatives are documented within the history of present illness and remainder are negative.   BP 118/66   Pulse 79   Temp 97.8 F (36.6 C) (Oral)   Physical Exam Gen.: No acute distress Neck: Supple and nontender Chest: Clear to auscultation Heart: Regular rate and rhythm Abdomen: Soft, nontender, nondistended. Staples in place the midline without any evidence of  erythema or drainage. Ileostomy present in the right lower quadrant that is pink, patent and productive of stool.    No results found for this or any previous visit (from the past 48 hour(s)). No results found.  Assessment/Plan:  1. Aftercare following surgery 70 year old female doing well for week status post ileostomy creation and colon resection. Encouraged continuing to work for her strength and appetite. She is being discharged home from the skilled nursing facility in the next 1-2 days.  2. 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 She will need a barium enema in the coming 1-2 weeks to document healing of her anastomosis. Once there is an appropriate imaging we will then schedule her for ileostomy takedown. Patient voiced understanding and will follow-up after barium enema.     Clayburn Pert, MD [name] General Surgeon  07/07/2016,3:29 PM

## 2016-07-07 NOTE — Addendum Note (Signed)
Addended by: Wayna Chalet on: 07/07/2016 05:02 PM   Modules accepted: Orders

## 2016-07-08 ENCOUNTER — Telehealth: Payer: Self-pay

## 2016-07-08 ENCOUNTER — Encounter: Payer: Self-pay | Admitting: General Surgery

## 2016-07-08 NOTE — Telephone Encounter (Signed)
Called patient's daughter and informed her that her mother's barium enema is scheduled for 07/22/2016 at 10:00 AM but to arrive at 9:30 AM to the [place]. I also told her that she needed to pick up a prep kit 3 days prior to her appointment. She understood.  I also told her that she was going to be seen by Dr. Adonis Huguenin on 07/22/2016 at 1:30 PM. She agreed.

## 2016-07-11 ENCOUNTER — Encounter: Payer: Self-pay | Admitting: Emergency Medicine

## 2016-07-11 ENCOUNTER — Inpatient Hospital Stay: Payer: Commercial Managed Care - HMO

## 2016-07-11 ENCOUNTER — Inpatient Hospital Stay
Admission: EM | Admit: 2016-07-11 | Discharge: 2016-07-21 | DRG: 330 | Disposition: A | Discharge status: Home / Self Care | Payer: Commercial Managed Care - HMO | Attending: Surgery | Admitting: Surgery

## 2016-07-11 DIAGNOSIS — E860 Dehydration: Secondary | ICD-10-CM

## 2016-07-11 DIAGNOSIS — E11649 Type 2 diabetes mellitus with hypoglycemia without coma: Secondary | ICD-10-CM | POA: Diagnosis not present

## 2016-07-11 DIAGNOSIS — Z961 Presence of intraocular lens: Secondary | ICD-10-CM | POA: Diagnosis present

## 2016-07-11 DIAGNOSIS — E875 Hyperkalemia: Secondary | ICD-10-CM | POA: Diagnosis present

## 2016-07-11 DIAGNOSIS — M316 Other giant cell arteritis: Secondary | ICD-10-CM | POA: Diagnosis present

## 2016-07-11 DIAGNOSIS — E079 Disorder of thyroid, unspecified: Secondary | ICD-10-CM | POA: Diagnosis present

## 2016-07-11 DIAGNOSIS — Z888 Allergy status to other drugs, medicaments and biological substances status: Secondary | ICD-10-CM | POA: Diagnosis not present

## 2016-07-11 DIAGNOSIS — Z880 Allergy status to penicillin: Secondary | ICD-10-CM | POA: Diagnosis not present

## 2016-07-11 DIAGNOSIS — I130 Hypertensive heart and chronic kidney disease with heart failure and stage 1 through stage 4 chronic kidney disease, or unspecified chronic kidney disease: Secondary | ICD-10-CM | POA: Diagnosis present

## 2016-07-11 DIAGNOSIS — D631 Anemia in chronic kidney disease: Secondary | ICD-10-CM | POA: Diagnosis present

## 2016-07-11 DIAGNOSIS — N184 Chronic kidney disease, stage 4 (severe): Secondary | ICD-10-CM | POA: Diagnosis present

## 2016-07-11 DIAGNOSIS — Z6837 Body mass index (BMI) 37.0-37.9, adult: Secondary | ICD-10-CM

## 2016-07-11 DIAGNOSIS — E785 Hyperlipidemia, unspecified: Secondary | ICD-10-CM | POA: Diagnosis present

## 2016-07-11 DIAGNOSIS — N189 Chronic kidney disease, unspecified: Secondary | ICD-10-CM | POA: Diagnosis not present

## 2016-07-11 DIAGNOSIS — E872 Acidosis: Secondary | ICD-10-CM | POA: Diagnosis present

## 2016-07-11 DIAGNOSIS — I509 Heart failure, unspecified: Secondary | ICD-10-CM | POA: Diagnosis present

## 2016-07-11 DIAGNOSIS — Z9841 Cataract extraction status, right eye: Secondary | ICD-10-CM

## 2016-07-11 DIAGNOSIS — I2510 Atherosclerotic heart disease of native coronary artery without angina pectoris: Secondary | ICD-10-CM | POA: Diagnosis present

## 2016-07-11 DIAGNOSIS — Z833 Family history of diabetes mellitus: Secondary | ICD-10-CM

## 2016-07-11 DIAGNOSIS — N179 Acute kidney failure, unspecified: Secondary | ICD-10-CM

## 2016-07-11 DIAGNOSIS — K5720 Diverticulitis of large intestine with perforation and abscess without bleeding: Principal | ICD-10-CM | POA: Diagnosis present

## 2016-07-11 DIAGNOSIS — Z432 Encounter for attention to ileostomy: Secondary | ICD-10-CM

## 2016-07-11 DIAGNOSIS — Z87891 Personal history of nicotine dependence: Secondary | ICD-10-CM

## 2016-07-11 DIAGNOSIS — K56609 Unspecified intestinal obstruction, unspecified as to partial versus complete obstruction: Secondary | ICD-10-CM

## 2016-07-11 DIAGNOSIS — E669 Obesity, unspecified: Secondary | ICD-10-CM | POA: Diagnosis present

## 2016-07-11 DIAGNOSIS — E1122 Type 2 diabetes mellitus with diabetic chronic kidney disease: Secondary | ICD-10-CM | POA: Diagnosis present

## 2016-07-11 DIAGNOSIS — K567 Ileus, unspecified: Secondary | ICD-10-CM | POA: Diagnosis not present

## 2016-07-11 DIAGNOSIS — I739 Peripheral vascular disease, unspecified: Secondary | ICD-10-CM | POA: Diagnosis present

## 2016-07-11 DIAGNOSIS — Z794 Long term (current) use of insulin: Secondary | ICD-10-CM

## 2016-07-11 DIAGNOSIS — Z882 Allergy status to sulfonamides status: Secondary | ICD-10-CM

## 2016-07-11 DIAGNOSIS — K5732 Diverticulitis of large intestine without perforation or abscess without bleeding: Secondary | ICD-10-CM

## 2016-07-11 HISTORY — DX: Diverticulitis of intestine, part unspecified, without perforation or abscess without bleeding: K57.92

## 2016-07-11 LAB — GLUCOSE, CAPILLARY
GLUCOSE-CAPILLARY: 127 mg/dL — AB (ref 65–99)
Glucose-Capillary: 123 mg/dL — ABNORMAL HIGH (ref 65–99)

## 2016-07-11 LAB — BASIC METABOLIC PANEL
ANION GAP: 6 (ref 5–15)
BUN: 96 mg/dL — ABNORMAL HIGH (ref 6–20)
CALCIUM: 9.9 mg/dL (ref 8.9–10.3)
CO2: 13 mmol/L — ABNORMAL LOW (ref 22–32)
CREATININE: 4.61 mg/dL — AB (ref 0.44–1.00)
Chloride: 116 mmol/L — ABNORMAL HIGH (ref 101–111)
GFR, EST AFRICAN AMERICAN: 10 mL/min — AB (ref >60)
GFR, EST NON AFRICAN AMERICAN: 9 mL/min — AB (ref >60)
Glucose, Bld: 76 mg/dL (ref 65–99)
Potassium: 5.7 mmol/L — ABNORMAL HIGH (ref 3.5–5.1)
Sodium: 135 mmol/L (ref 135–145)

## 2016-07-11 LAB — URINALYSIS COMPLETE WITH MICROSCOPIC (ARMC ONLY)
BILIRUBIN URINE: NEGATIVE
GLUCOSE, UA: NEGATIVE mg/dL
Ketones, ur: NEGATIVE mg/dL
NITRITE: NEGATIVE
Protein, ur: 30 mg/dL — AB
Specific Gravity, Urine: 1.008 (ref 1.005–1.030)
pH: 5 (ref 5.0–8.0)

## 2016-07-11 LAB — TROPONIN I
TROPONIN I: 0.1 ng/mL — AB (ref <0.03)
Troponin I: 0.09 ng/mL (ref <0.03)

## 2016-07-11 LAB — CBC
HCT: 25 % — ABNORMAL LOW (ref 35.0–47.0)
HEMOGLOBIN: 8.4 g/dL — AB (ref 12.0–16.0)
MCH: 29.7 pg (ref 26.0–34.0)
MCHC: 33.7 g/dL (ref 32.0–36.0)
MCV: 88.2 fL (ref 80.0–100.0)
PLATELETS: 249 10*3/uL (ref 150–440)
RBC: 2.84 MIL/uL — AB (ref 3.80–5.20)
RDW: 15.7 % — ABNORMAL HIGH (ref 11.5–14.5)
WBC: 9.5 10*3/uL (ref 3.6–11.0)

## 2016-07-11 LAB — LIPASE, BLOOD: LIPASE: 57 U/L — AB (ref 11–51)

## 2016-07-11 MED ORDER — ATORVASTATIN CALCIUM 20 MG PO TABS
40.0000 mg | ORAL_TABLET | Freq: Every day | ORAL | Status: DC
  Administered 2016-07-11 – 2016-07-21 (×10): 40 mg via ORAL
  Filled 2016-07-11 (×10): qty 2

## 2016-07-11 MED ORDER — ONDANSETRON HCL 4 MG/2ML IJ SOLN
4.0000 mg | Freq: Four times a day (QID) | INTRAMUSCULAR | Status: DC | PRN
  Administered 2016-07-11 – 2016-07-12 (×2): 4 mg via INTRAVENOUS
  Filled 2016-07-11 (×2): qty 2

## 2016-07-11 MED ORDER — SODIUM CHLORIDE 0.9 % IV BOLUS (SEPSIS)
1000.0000 mL | Freq: Once | INTRAVENOUS | Status: AC
  Administered 2016-07-11: 1000 mL via INTRAVENOUS

## 2016-07-11 MED ORDER — ONDANSETRON HCL 4 MG/2ML IJ SOLN
4.0000 mg | Freq: Once | INTRAMUSCULAR | Status: AC
  Administered 2016-07-11: 4 mg via INTRAVENOUS
  Filled 2016-07-11: qty 2

## 2016-07-11 MED ORDER — AMLODIPINE BESYLATE 10 MG PO TABS
10.0000 mg | ORAL_TABLET | Freq: Every day | ORAL | Status: DC
  Administered 2016-07-11 – 2016-07-20 (×10): 10 mg via ORAL
  Filled 2016-07-11 (×10): qty 1

## 2016-07-11 MED ORDER — GABAPENTIN 100 MG PO CAPS
100.0000 mg | ORAL_CAPSULE | Freq: Three times a day (TID) | ORAL | Status: DC
  Administered 2016-07-11 – 2016-07-21 (×28): 100 mg via ORAL
  Filled 2016-07-11 (×28): qty 1

## 2016-07-11 MED ORDER — HEPARIN SODIUM (PORCINE) 5000 UNIT/ML IJ SOLN
5000.0000 [IU] | Freq: Three times a day (TID) | INTRAMUSCULAR | Status: DC
  Administered 2016-07-11 – 2016-07-21 (×28): 5000 [IU] via SUBCUTANEOUS
  Filled 2016-07-11 (×28): qty 1

## 2016-07-11 MED ORDER — ACETAMINOPHEN 500 MG PO TABS: 500.0000 mg | ORAL_TABLET | Freq: Four times a day (QID) | ORAL | Status: DC | PRN

## 2016-07-11 MED ORDER — SODIUM CHLORIDE 0.9 % IV BOLUS (SEPSIS)
500.0000 mL | Freq: Once | INTRAVENOUS | Status: AC
  Administered 2016-07-11: 500 mL via INTRAVENOUS

## 2016-07-11 MED ORDER — INSULIN GLARGINE 100 UNIT/ML ~~LOC~~ SOLN
10.0000 [IU] | Freq: Every day | SUBCUTANEOUS | Status: DC
  Administered 2016-07-11 – 2016-07-15 (×5): 10 [IU] via SUBCUTANEOUS
  Filled 2016-07-11 (×6): qty 0.1

## 2016-07-11 MED ORDER — PREDNISONE 2.5 MG PO TABS
2.5000 mg | ORAL_TABLET | Freq: Every day | ORAL | Status: DC
  Administered 2016-07-11 – 2016-07-21 (×10): 2.5 mg via ORAL
  Filled 2016-07-11 (×11): qty 1

## 2016-07-11 MED ORDER — INSULIN ASPART 100 UNIT/ML ~~LOC~~ SOLN
0.0000 [IU] | Freq: Three times a day (TID) | SUBCUTANEOUS | Status: DC
Start: 2016-07-12 — End: 2016-07-21
  Administered 2016-07-12: 7 [IU] via SUBCUTANEOUS
  Administered 2016-07-12 – 2016-07-13 (×4): 4 [IU] via SUBCUTANEOUS
  Administered 2016-07-14: 7 [IU] via SUBCUTANEOUS
  Administered 2016-07-15: 3 [IU] via SUBCUTANEOUS
  Administered 2016-07-15: 7 [IU] via SUBCUTANEOUS
  Administered 2016-07-16: 4 [IU] via SUBCUTANEOUS
  Administered 2016-07-16: 11 [IU] via SUBCUTANEOUS
  Administered 2016-07-16: 4 [IU] via SUBCUTANEOUS
  Administered 2016-07-17 (×3): 3 [IU] via SUBCUTANEOUS
  Administered 2016-07-18: 4 [IU] via SUBCUTANEOUS
  Administered 2016-07-18: 3 [IU] via SUBCUTANEOUS
  Administered 2016-07-19 – 2016-07-20 (×4): 4 [IU] via SUBCUTANEOUS
  Administered 2016-07-20: 3 [IU] via SUBCUTANEOUS
  Administered 2016-07-21: 4 [IU] via SUBCUTANEOUS
  Filled 2016-07-11: qty 3
  Filled 2016-07-11 (×2): qty 4
  Filled 2016-07-11: qty 3
  Filled 2016-07-11 (×2): qty 4
  Filled 2016-07-11: qty 3
  Filled 2016-07-11: qty 4
  Filled 2016-07-11: qty 7
  Filled 2016-07-11: qty 3
  Filled 2016-07-11 (×2): qty 4
  Filled 2016-07-11: qty 3
  Filled 2016-07-11 (×4): qty 4
  Filled 2016-07-11: qty 9
  Filled 2016-07-11: qty 11
  Filled 2016-07-11: qty 7
  Filled 2016-07-11: qty 4
  Filled 2016-07-11: qty 7

## 2016-07-11 MED ORDER — ASPIRIN EC 81 MG PO TBEC
81.0000 mg | DELAYED_RELEASE_TABLET | Freq: Two times a day (BID) | ORAL | Status: DC
  Administered 2016-07-11 – 2016-07-19 (×15): 81 mg via ORAL
  Filled 2016-07-11 (×15): qty 1

## 2016-07-11 MED ORDER — METOPROLOL TARTRATE 50 MG PO TABS
50.0000 mg | ORAL_TABLET | Freq: Two times a day (BID) | ORAL | Status: DC
  Administered 2016-07-11 – 2016-07-21 (×18): 50 mg via ORAL
  Filled 2016-07-11 (×20): qty 1

## 2016-07-11 MED ORDER — PANTOPRAZOLE SODIUM 40 MG PO TBEC
40.0000 mg | DELAYED_RELEASE_TABLET | Freq: Every day | ORAL | Status: DC
  Administered 2016-07-12 – 2016-07-21 (×9): 40 mg via ORAL
  Filled 2016-07-11 (×9): qty 1

## 2016-07-11 MED ORDER — SODIUM CHLORIDE 0.9 % IV SOLN
INTRAVENOUS | Status: AC
  Administered 2016-07-11 – 2016-07-12 (×3): via INTRAVENOUS

## 2016-07-11 MED ORDER — INSULIN ASPART 100 UNIT/ML ~~LOC~~ SOLN
0.0000 [IU] | Freq: Every day | SUBCUTANEOUS | Status: DC
  Administered 2016-07-15 – 2016-07-18 (×2): 3 [IU] via SUBCUTANEOUS
  Filled 2016-07-11 (×2): qty 3

## 2016-07-11 MED ORDER — SODIUM CHLORIDE 0.9 % IV SOLN
Freq: Once | INTRAVENOUS | Status: AC
  Administered 2016-07-11: 20:00:00 via INTRAVENOUS

## 2016-07-11 MED ORDER — LORAZEPAM 0.5 MG PO TABS: 0.5000 mg | ORAL_TABLET | Freq: Three times a day (TID) | ORAL | Status: DC | PRN

## 2016-07-11 NOTE — ED Provider Notes (Signed)
[facility] Emergency Department Provider Note  ____________________________________________  Time seen: Approximately 3:27 PM  I have reviewed the triage vital signs and the nursing notes.   HISTORY  Chief Complaint Weakness    HPI [name] is a 70 y.o. female brought to the ED due to malaise and generalized weakness and fatigue and nausea and vomiting for the past week. She's not been able to eat or drink anything. She's not been feeling well ever since having a colostomy due to diverticulitis. Denies any significant abdominal pain fevers or chills. No chest pain or shortness of breath.     Past Medical History:  Diagnosis Date  . Arthritis    Inflammatory  . CHF (congestive heart failure) ([place])   . Chronic renal insufficiency February 07, 2015  . Coronary artery dilation ([place])   . Diabetes mellitus without complication ([place])    Type II  . Diverticulitis   . Hyperlipidemia   . Hypertension   . Renal insufficiency   . Sinus problem   . Thyroid disease   . Ulcer of left lower leg [facility]) February 07, 2015     Patient Active Problem List   Diagnosis Date Noted  . Temporal arteritis ([place]) 07/07/2016  . Diverticulitis of colon   . Hypertensive heart disease without heart failure 05/10/2016  . Snoring 05/10/2016  . Grief 02/22/2016  . 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 Pyoderma gangrenosa 11/12/2015  . Demand ischemia ([place]) 11/07/2015  . Uncontrolled hypertension 11/05/2015  . Uncontrolled diabetes mellitus ([place]) 11/05/2015  . Type 2 diabetes mellitus with hyperglycemia ([place]) 11/05/2015  . 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 Varicose veins of left lower extremity with ulcer of calf ([place]) 08/04/2015  . Venous stasis ulcer of ankle ([place]) 07/15/2015  . Chronic kidney disease, stage 4, severely  decreased GFR ([facility]) 03/19/2015  . Bilateral carpal tunnel syndrome 03/01/2014  . Degenerative joint disease (DJD) of hip 03/01/2014  . Hyperlipidemia 03/01/2014  . Hypothyroid 03/01/2014     Past Surgical History:  Procedure Laterality Date  . ABDOMINAL HYSTERECTOMY    . CATARACT EXTRACTION W/PHACO Right 03/11/2015   Procedure: CATARACT EXTRACTION PHACO AND INTRAOCULAR LENS PLACEMENT (IOC);  Surgeon: Birder Robson, MD;  Location: [facility] ORS;  Service: Ophthalmology;  Laterality: Right;  [place] 00:40 AP% 24.3 CDE 9.86  . CENTRAL VENOUS CATHETER INSERTION Right 06/09/2016   Procedure: INSERTION CENTRAL LINE ADULT;  Surgeon: Dia Crawford III, MD;  Location: [facility] ORS;  Service: General;  Laterality: Right;  . COLON RESECTION SIGMOID N/A 06/09/2016   Procedure: COLON RESECTION SIGMOID;  Surgeon: Dia Crawford III, MD;  Location: [facility] ORS;  Service: General;  Laterality: N/A;  . COLOSTOMY N/A 06/09/2016   Procedure: COLOSTOMY;  Surgeon: Dia Crawford III, MD;  Location: [facility] ORS;  Service: General;  Laterality: N/A;  . CYST EXCISION    . DILATION AND CURETTAGE OF UTERUS  1984  . I&D EXTREMITY Right 09/04/2015   Procedure: IRRIGATION AND DEBRIDEMENT EXTREMITY/ AND BIOPSY;  Surgeon: Algernon Huxley, MD;  Location: [facility] ORS;  Service: Vascular;  Laterality: Right;  . PARTIAL HYSTERECTOMY  1985  . ROTATOR CUFF REPAIR  2004     Prior to Admission medications   Medication Sig Start Date End Date Taking? Authorizing Provider  acetaminophen (TYLENOL) 500 MG tablet Take 500 mg by mouth every 6 (six) hours as needed  for mild pain, fever or headache.     Historical Provider, MD  amLODipine (NORVASC) 10 MG tablet Take 10 mg by mouth daily.    Historical Provider, MD  amoxicillin-clavulanate (AUGMENTIN) 875-125 MG tablet Take 1 tablet by mouth 2 (two) times daily. 06/22/16   Hubbard Robinson, MD  aspirin 81 MG tablet Take 81 mg by mouth 2 (two) times daily.     Historical Provider, MD  atorvastatin (LIPITOR) 40 MG  tablet Take 40 mg by mouth daily.    Historical Provider, MD  furosemide (LASIX) 20 MG tablet Take 40 mg by mouth daily.     Historical Provider, MD  gabapentin (NEURONTIN) 100 MG capsule Take 100 mg by mouth 3 (three) times daily.    Historical Provider, MD  hydrALAZINE (APRESOLINE) 25 MG tablet Take 25 mg by mouth 2 (two) times daily. Take another 25mg  if Systolic consistently over [id number] or dystolic over 90    Historical Provider, MD  hydrochlorothiazide (HYDRODIURIL) 25 MG tablet Take 25 mg by mouth daily.    Historical Provider, MD  ibuprofen (ADVIL,MOTRIN) 800 MG tablet Take 1 tablet (800 mg total) by mouth every 8 (eight) hours as needed. 06/22/16   Hubbard Robinson, MD  insulin aspart (NOVOLOG) 100 UNIT/ML injection Inject 12 Units into the skin 3 (three) times daily.     Historical Provider, MD  insulin glargine (LANTUS) 100 UNIT/ML injection Inject 0.25 mLs (25 Units total) into the skin at bedtime. 11/10/15   Nicholes Mango, MD  levocetirizine (XYZAL) 5 MG tablet Take 5 mg by mouth every evening.    Historical Provider, MD  lisinopril (PRINIVIL,ZESTRIL) 20 MG tablet Take 20 mg by mouth daily.    Historical Provider, MD  LORazepam (ATIVAN) 0.5 MG tablet Take 1 tablet (0.5 mg total) by mouth every 8 (eight) hours as needed for anxiety. 02/17/16 02/16/17  Earleen Newport, MD  metoprolol (LOPRESSOR) 50 MG tablet Take 50 mg by mouth 2 (two) times daily.    Historical Provider, MD  omeprazole (PRILOSEC) 20 MG capsule Take 20 mg by mouth daily.    Historical Provider, MD  pioglitazone (ACTOS) 15 MG tablet Take 15 mg by mouth daily.    Historical Provider, MD  potassium chloride (K-DUR) 10 MEQ tablet Take 10 mEq by mouth daily.    Historical Provider, MD  predniSONE (DELTASONE) 5 MG tablet Take 2.5 mg by mouth daily.     Historical Provider, MD     Allergies Doxycycline; Levaquin [levofloxacin]; Penicillins; and Sulfa antibiotics   Family History  Problem Relation Age of Onset  . Diabetes  Mellitus II Mother   . Diabetes Mellitus II Father     Social History Social History  Substance Use Topics  . Smoking status: Former Smoker    Years: 45.00  . Smokeless tobacco: Never Used  . Alcohol use No    Review of Systems  Constitutional:   No fever or chills.  ENT:   No sore throat. No rhinorrhea. Cardiovascular:   No chest pain. Respiratory:   No dyspnea or cough. Gastrointestinal:   Negative for abdominal pain, Positive vomiting. Decreased colostomy output.  Genitourinary:   Negative for dysuria or difficulty urinating. Low urine output  10-point ROS otherwise negative.  ____________________________________________   PHYSICAL EXAM:  VITAL SIGNS: ED Triage Vitals  Enc Vitals Group     BP 07/11/16 1217 (!) 110/44     Pulse Rate 07/11/16 1217 83     Resp 07/11/16 1217 16  Temp 07/11/16 1217 97.8 F (36.6 C)     Temp Source 07/11/16 1217 Oral     SpO2 07/11/16 1217 99 %     Weight 07/11/16 1217 216 lb (98 kg)     Height 07/11/16 1217 5\' 6"  (1.676 m)     Head Circumference --      [place] --      Pain Score 07/11/16 1218 0     Pain Loc --      Pain Edu? --      Excl. in [place]? --     Vital signs reviewed, nursing assessments reviewed.   Constitutional:   Alert and oriented. Not in distress. Eyes:   No scleral icterus. No conjunctival pallor. PERRL. EOMI.  No nystagmus. ENT   Head:   Normocephalic and atraumatic.   Nose:   No congestion/rhinnorhea. No septal hematoma   Mouth/Throat:   Dry mucous membranes, no pharyngeal erythema. No peritonsillar mass.    Neck:   No stridor. No SubQ emphysema. No meningismus. Hematological/Lymphatic/Immunilogical:   No cervical lymphadenopathy. Cardiovascular:   RRR. Symmetric bilateral radial and DP pulses.  No murmurs.  Respiratory:   Normal respiratory effort without tachypnea nor retractions. Breath sounds are clear and equal bilaterally. No wheezes/rales/rhonchi. Gastrointestinal:   Soft and  nontender. Non distended. There is no CVA tenderness.  No rebound, rigidity, or guarding. Colostomy in place Genitourinary:   deferred Musculoskeletal:   Nontender with normal range of motion in all extremities. No joint effusions.  No lower extremity tenderness.  No edema. Neurologic:   Normal speech and language.  CN 2-10 normal. Motor grossly intact. No gross focal neurologic deficits are appreciated.  Skin:    Skin is warm, dry and intact. No rash noted.  No petechiae, purpura, or bullae. Poor skin turgor ____________________________________________    LABS (pertinent positives/negatives) (all labs ordered are listed, but only abnormal results are displayed) Labs Reviewed  BASIC METABOLIC PANEL - Abnormal; Notable for the following:       Result Value   Potassium 5.7 (*)    Chloride 116 (*)    CO2 13 (*)    BUN 96 (*)    Creatinine, Ser 4.61 (*)    GFR calc non Af Amer 9 (*)    GFR calc Af Amer 10 (*)    All other components within normal limits  CBC - Abnormal; Notable for the following:    RBC 2.84 (*)    Hemoglobin 8.4 (*)    HCT 25.0 (*)    RDW 15.7 (*)    All other components within normal limits  LIPASE, BLOOD - Abnormal; Notable for the following:    Lipase 57 (*)    All other components within normal limits  TROPONIN I - Abnormal; Notable for the following:    Troponin I 0.10 (*)    All other components within normal limits  URINALYSIS COMPLETEWITH MICROSCOPIC ([facility] ONLY)  TROPONIN I  CBG MONITORING, ED   ____________________________________________   EKG    ____________________________________________    RADIOLOGY    ____________________________________________   PROCEDURES Procedures  ____________________________________________   INITIAL IMPRESSION / ASSESSMENT AND PLAN / ED COURSE  Pertinent labs & imaging results that were available during my care of the patient were reviewed by me and considered in my medical decision making (see  chart for details).  Patient presents with generalized weakness and oral intolerance after recent GI surgery. Labs reveal acute on chronic renal failure with creatinine of 4.6 BUN  in the 90s. Potassium is not severely elevated. Is not having any exertional symptoms chest pain or shortness of breath to suggest that the troponin is related to ACS. We'll trend. Case discussed with hospitalist for admission for continued IV hydration and management of her acute renal failure     Clinical Course   ____________________________________________   FINAL CLINICAL IMPRESSION(S) / ED DIAGNOSES  Final diagnoses:  Acute on chronic renal failure ([place])  Dehydration       Portions of this note were generated with dragon dictation software. Dictation errors may occur despite best attempts at proofreading.    Carrie Mew, MD 07/11/16 [phone]

## 2016-07-11 NOTE — Consult Note (Signed)
Surgical Consultation  07/11/2016  [name] is an 70 y.o. female.   CC: Nausea vomiting and dehydration  HPI: This a patient admitted from the emergency room earlier today and renal failure. Her daughter called me stating that she had been discharged several days ago from her rehabilitation facility. Patient not been taking much in the way of oral intake and had been having high ileostomy output. She had nausea and vomiting. She denies fevers or chills. I asked the patient to come to the emergency room for evaluation because of her tenuous renal status as I had reviewed her chart showing a elevated creatinine of 2.2 back in the end of August. She was admitted to the floor from the emergency room by prime doc and I visited with the patient family and other daughter by telephone this evening.  Past Medical History:  Diagnosis Date  . Arthritis    Inflammatory  . CHF (congestive heart failure) ([place])   . Chronic renal insufficiency February 07, 2015  . Coronary artery dilation ([place])   . 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 Diverticulitis   . Hyperlipidemia   . Hypertension   . Renal insufficiency   . Sinus problem   . Thyroid disease   . Ulcer of left lower leg [facility]) February 07, 2015    Past Surgical History:  Procedure Laterality Date  . ABDOMINAL HYSTERECTOMY    . CATARACT EXTRACTION W/PHACO Right 03/11/2015   Procedure: CATARACT EXTRACTION PHACO AND INTRAOCULAR LENS PLACEMENT (IOC);  Surgeon: Birder Robson, MD;  Location: [facility] ORS;  Service: Ophthalmology;  Laterality: Right;  [place] 00:40 AP% 24.3 CDE 9.86  . CENTRAL VENOUS CATHETER INSERTION Right 06/09/2016   Procedure: INSERTION CENTRAL LINE ADULT;  Surgeon: Dia Crawford III, MD;  Location: [facility] ORS;  Service: General;  Laterality: Right;  . COLON RESECTION SIGMOID N/A 06/09/2016   Procedure: COLON RESECTION SIGMOID;  Surgeon: Dia Crawford III, MD;  Location: [facility] ORS;  Service: General;  Laterality: N/A;  .  COLOSTOMY N/A 06/09/2016   Procedure: COLOSTOMY;  Surgeon: Dia Crawford III, MD;  Location: [facility] ORS;  Service: General;  Laterality: N/A;  . CYST EXCISION    . DILATION AND CURETTAGE OF UTERUS  1984  . I&D EXTREMITY Right 09/04/2015   Procedure: IRRIGATION AND DEBRIDEMENT EXTREMITY/ AND BIOPSY;  Surgeon: Algernon Huxley, MD;  Location: [facility] ORS;  Service: Vascular;  Laterality: Right;  . PARTIAL HYSTERECTOMY  1985  . ROTATOR CUFF REPAIR  2004    Family History  Problem Relation Age of Onset  . Diabetes Mellitus II Mother   . Diabetes Mellitus II Father     Social History:  reports that she has quit smoking. She quit after 45.00 years of use. She has never used smokeless tobacco. She reports that she does not drink alcohol or use drugs.  Allergies:  Allergies  Allergen Reactions  . Doxycycline Other (See Comments)    Reaction: unknown  . Levaquin [Levofloxacin] Other (See Comments)    Causes joint pain  . Penicillins Rash and Other (See Comments)    Has patient had a PCN reaction causing immediate rash, facial/tongue/throat swelling, SOB or lightheadedness with hypotension: Yes Has patient had a PCN reaction causing severe rash involving mucus membranes or skin necrosis: No Has patient had a PCN reaction that required hospitalization No Has patient had a PCN reaction occurring within the last 10 years: No If all of the above answers are "NO", then may  proceed with Cephalosporin use.  . Sulfa Antibiotics Swelling and Rash    Medications reviewed.   Review of Systems:   Review of Systems  Constitutional: Negative for chills and fever.  HENT: Negative.   Eyes: Negative.   Respiratory: Negative.   Cardiovascular: Negative.   Gastrointestinal: Positive for nausea and vomiting. Negative for abdominal pain and heartburn.  Genitourinary: Negative.   Musculoskeletal: Negative.   Skin: Negative.   Neurological: Negative.   Endo/Heme/Allergies: Negative.   Psychiatric/Behavioral:  Negative.      Physical Exam:  BP (!) 141/49 (BP Location: Right Arm)   Pulse 87   Temp 98.4 F (36.9 C) (Oral)   Resp 18   Ht 5' 6"  (1.676 m)   Wt 216 lb (98 kg)   SpO2 100%   BMI 34.86 kg/m   Physical Exam  Constitutional: She is oriented to person, place, and time and well-developed, well-nourished, and in no distress. No distress.  HENT:  Head: Normocephalic and atraumatic.  Abdominal: Soft. She exhibits no distension. There is no tenderness. There is no rebound and no guarding.  Wound is clean no erythema no drainage Steri-Strips in placed ileostomy is present with minimal output in bag (unclear when bag was emptied)  Neurological: She is alert and oriented to person, place, and time.  Skin: Skin is warm and dry. No rash noted. She is not diaphoretic. No erythema.  Vitals reviewed.     Results for orders placed or performed during the hospital encounter of 07/11/16 (from the past 48 hour(s))  Basic metabolic panel     Status: Abnormal   Collection Time: 07/11/16 12:21 PM  Result Value Ref Range   Sodium 135 135 - 145 mmol/L   Potassium 5.7 (H) 3.5 - 5.1 mmol/L   Chloride 116 (H) 101 - 111 mmol/L   CO2 13 (L) 22 - 32 mmol/L   Glucose, Bld 76 65 - 99 mg/dL   BUN 96 (H) 6 - 20 mg/dL    Comment: RESULT CONFIRMED BY MANUAL DILUTION. [facility].   Creatinine, Ser 4.61 (H) 0.44 - 1.00 mg/dL   Calcium 9.9 8.9 - 10.3 mg/dL   GFR calc non Af Amer 9 (L) >60 mL/min   GFR calc Af Amer 10 (L) >60 mL/min    Comment: (NOTE) The eGFR has been calculated using the CKD EPI equation. This calculation has not been validated in all clinical situations. eGFR's persistently <60 mL/min signify possible Chronic Kidney Disease.    Anion gap 6 5 - 15  CBC     Status: Abnormal   Collection Time: 07/11/16 12:21 PM  Result Value Ref Range   WBC 9.5 3.6 - 11.0 K/uL   RBC 2.84 (L) 3.80 - 5.20 MIL/uL   Hemoglobin 8.4 (L) 12.0 - 16.0 g/dL   HCT 25.0 (L) 35.0 - 47.0 %   MCV 88.2 80.0 - 100.0 fL    MCH 29.7 26.0 - 34.0 pg   MCHC 33.7 32.0 - 36.0 g/dL   RDW 15.7 (H) 11.5 - 14.5 %   Platelets 249 150 - 440 K/uL  Urinalysis complete, with microscopic     Status: Abnormal   Collection Time: 07/11/16 12:21 PM  Result Value Ref Range   Color, Urine YELLOW (A) YELLOW   APPearance CLOUDY (A) CLEAR   Glucose, UA NEGATIVE NEGATIVE mg/dL   Bilirubin Urine NEGATIVE NEGATIVE   Ketones, ur NEGATIVE NEGATIVE mg/dL   Specific Gravity, Urine 1.008 1.005 - 1.030   Hgb urine dipstick 1+ (A)  NEGATIVE   pH 5.0 5.0 - 8.0   Protein, ur 30 (A) NEGATIVE mg/dL   Nitrite NEGATIVE NEGATIVE   Leukocytes, UA 3+ (A) NEGATIVE   RBC / HPF 6-30 0 - 5 RBC/hpf   WBC, UA TOO NUMEROUS TO COUNT 0 - 5 WBC/hpf   Bacteria, UA FEW (A) NONE SEEN   Squamous Epithelial / LPF 0-5 (A) NONE SEEN   WBC Clumps PRESENT    Mucous PRESENT    Hyaline Casts, UA PRESENT   Lipase, blood     Status: Abnormal   Collection Time: 07/11/16 12:21 PM  Result Value Ref Range   Lipase 57 (H) 11 - 51 U/L  Troponin I     Status: Abnormal   Collection Time: 07/11/16 12:21 PM  Result Value Ref Range   Troponin I 0.10 (HH) <0.03 ng/mL    Comment: CRITICAL RESULT CALLED TO, READ BACK BY AND VERIFIED WITH DENIA ROYSTER @ 1302 07/11/16 BY [facility]   Troponin I     Status: Abnormal   Collection Time: 07/11/16  3:33 PM  Result Value Ref Range   Troponin I 0.09 (HH) <0.03 ng/mL    Comment: CRITICAL VALUE NOTED. VALUE IS CONSISTENT WITH PREVIOUSLY REPORTED/CALLED VALUE. [place].    US Renal  Result Date: 07/11/2016 CLINICAL DATA:  Acute on chronic renal failure, hypertension, diabetes mellitus, CHF EXAM: RENAL / URINARY TRACT ULTRASOUND COMPLETE COMPARISON:  CT abdomen and pelvis 06/16/2016, prior ultrasound 11/07/2015 FINDINGS: Right Kidney: Length: 10.3 cm. Normal cortical thickness. Slightly increased cortical echogenicity. Small cyst at upper pole 17 x 13 x 15 mm. Questionable nodule at inferior pole RIGHT kidney 2.6 x 1.7 x 2.0 cm containing a  septation though no lesion is seen at this site on CT of 16/07/9603, uncertain if this represents a subtle complicated cyst or artifact. No lesion at this site on prior ultrasound. No hydronephrosis or shadowing calcifications. Left Kidney: Length: 10.1 cm. Normal cortical thickness. Slightly increased cortical echogenicity. Question subtle hypoechoic nodule at the mid LEFT kidney laterally ; vague area of hypoattenuation seen on recent CT at this site. No hydronephrosis, additional mass or shadowing calcification Bladder: Normal appearance IMPRESSION: Medical renal disease changes of both kidneys. Small cyst at upper pole of RIGHT kidney. Potential subtle mass lesion at the lateral mid LEFT kidney with questionable complicated cystic lesion versus artifact at inferior pole RIGHT kidney. Confirmation and characterization of these lesions by MR without contrast recommended. Electronically Signed   By: Lavonia Dana M.D.   On: 07/11/2016 18:17    Assessment/Plan:  BUN and creatinine are elevated severely and she needs to be rehydrated. Prime document ordered for 125 an hour maintenance IV but I will add an additional bolus of normal saline to be given over 2 hours. I believe she is severely dehydrated and needs to be aggressively rehydrated. I will also order an abdominal film as she has been having nausea and vomiting in there is no output in her bag this evening. Want to make sure that she does not have a mechanical small bowel obstruction following her operative procedure. This was discussed with her family and with nursing.  Florene Glen, MD, [name]

## 2016-07-11 NOTE — ED Triage Notes (Addendum)
C/O weakness and fatigue x 1 month.  Patient had bowel resection with ileostomy 2 months ago (due to diverticulitis by Dr. Pat Patrick).  Had stayed at [facility] for 14 days, just discharged on Thursday.  Weakness and fatigue worse today.  Also c/o nausea and vomiting.  Nausea x 10 days and vomited once today.  Dr. Burt Knack referred patient to ED due to concerns of dehydration.

## 2016-07-11 NOTE — ED Triage Notes (Signed)
Pt here from home with c/o increased weakness since abdominal surgery in August. Pt was told by Dr. Burt Knack to come to the ED.  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  Family thinks she is dehydrated as well due to decreased appetite.

## 2016-07-11 NOTE — ED Notes (Signed)
Patient transported to Ultrasound 

## 2016-07-11 NOTE — ED Notes (Signed)
Elevated Troponin reported to [facility] MD

## 2016-07-11 NOTE — H&P (Signed)
[place] [facility] [place] NAME: [name]    MR#:  [id number]  DATE OF BIRTH:  1946/02/01  DATE OF ADMISSION:  07/11/2016  PRIMARY CARE PHYSICIAN: Glendon Axe, MD   REQUESTING/REFERRING PHYSICIAN: Joni Fears  CHIEF COMPLAINT:   Chief Complaint  Patient presents with  . Weakness    HISTORY OF PRESENT ILLNESS: [name]  is a 70 y.o. female with a known history of CHF, HLD, Htn, CKD, DM, CAD- recent admission to [facility] for diverticulitis and ileostomy was done, sent to rehab- recovered some. Went home 3-4 days ago. For last 4-5 days have nausea and vomit, not eating enough and having generalized back pain. In ER noted to have worsening in renal func.   PAST MEDICAL HISTORY:   Past Medical History:  Diagnosis Date  . Arthritis    Inflammatory  . CHF (congestive heart failure) ([place])   . Chronic renal insufficiency February 07, 2015  . Coronary artery dilation ([place])   . Diabetes mellitus without complication ([place])    Type II  . Diverticulitis   . Hyperlipidemia   . Hypertension   . Renal insufficiency   . Sinus problem   . Thyroid disease   . Ulcer of left lower leg [facility]) February 07, 2015    PAST SURGICAL HISTORY: Past Surgical History:  Procedure Laterality Date  . ABDOMINAL HYSTERECTOMY    . CATARACT EXTRACTION W/PHACO Right 03/11/2015   Procedure: CATARACT EXTRACTION PHACO AND INTRAOCULAR LENS PLACEMENT (IOC);  Surgeon: Birder Robson, MD;  Location: [facility] ORS;  Service: Ophthalmology;  Laterality: Right;  [place] 00:40 AP% 24.3 CDE 9.86  . CENTRAL VENOUS CATHETER INSERTION Right 06/09/2016   Procedure: INSERTION CENTRAL LINE ADULT;  Surgeon: Dia Crawford III, MD;  Location: [facility] ORS;  Service: General;  Laterality: Right;  . COLON RESECTION SIGMOID N/A 06/09/2016   Procedure: COLON RESECTION SIGMOID;  Surgeon: Dia Crawford III, MD;  Location: [facility] ORS;  Service: General;  Laterality: N/A;  . COLOSTOMY N/A 06/09/2016   Procedure: COLOSTOMY;   Surgeon: Dia Crawford III, MD;  Location: [facility] ORS;  Service: General;  Laterality: N/A;  . CYST EXCISION    . DILATION AND CURETTAGE OF UTERUS  1984  . I&D EXTREMITY Right 09/04/2015   Procedure: IRRIGATION AND DEBRIDEMENT EXTREMITY/ AND BIOPSY;  Surgeon: Algernon Huxley, MD;  Location: [facility] ORS;  Service: Vascular;  Laterality: Right;  . PARTIAL HYSTERECTOMY  1985  . ROTATOR CUFF REPAIR  2004    SOCIAL HISTORY:  Social History  Substance Use Topics  . Smoking status: Former Smoker    Years: 45.00  . Smokeless tobacco: Never Used  . Alcohol use No    FAMILY HISTORY:  Family History  Problem Relation Age of Onset  . Diabetes Mellitus II Mother   . Diabetes Mellitus II Father     DRUG ALLERGIES:  Allergies  Allergen Reactions  . Doxycycline Other (See Comments)    Reaction: unknown  . Levaquin [Levofloxacin] Other (See Comments)    Causes joint pain  . Penicillins Rash and Other (See Comments)    Has patient had a PCN reaction causing immediate rash, facial/tongue/throat swelling, SOB or lightheadedness with hypotension: Yes Has patient had a PCN reaction causing severe rash involving mucus membranes or skin necrosis: No Has patient had a PCN reaction that required hospitalization No Has patient had a PCN reaction occurring within the last 10 years: No If all of the above answers are "NO", then may proceed with  Cephalosporin use.  . Sulfa Antibiotics Swelling and Rash    REVIEW OF SYSTEMS:   CONSTITUTIONAL: No fever, fatigue or weakness.  EYES: No blurred or double vision.  EARS, NOSE, AND THROAT: No tinnitus or ear pain.  RESPIRATORY: No cough, shortness of breath, wheezing or hemoptysis.  CARDIOVASCULAR: No chest pain, orthopnea, edema.  GASTROINTESTINAL: positive for nausea, vomiting,no diarrhea or abdominal pain. Have back pain. GENITOURINARY: No dysuria, hematuria.  ENDOCRINE: No polyuria, nocturia,  HEMATOLOGY: No anemia, easy bruising or bleeding SKIN: No rash or  lesion. MUSCULOSKELETAL: No joint pain or arthritis.   NEUROLOGIC: No tingling, numbness, weakness.  PSYCHIATRY: No anxiety or depression.   MEDICATIONS AT HOME:  Prior to Admission medications   Medication Sig Start Date End Date Taking? Authorizing Provider  acetaminophen (TYLENOL) 500 MG tablet Take 500 mg by mouth every 6 (six) hours as needed for mild pain, fever or headache.     Historical Provider, MD  amLODipine (NORVASC) 10 MG tablet Take 10 mg by mouth daily.    Historical Provider, MD  amoxicillin-clavulanate (AUGMENTIN) 875-125 MG tablet Take 1 tablet by mouth 2 (two) times daily. 06/22/16   Hubbard Robinson, MD  aspirin 81 MG tablet Take 81 mg by mouth 2 (two) times daily.     Historical Provider, MD  atorvastatin (LIPITOR) 40 MG tablet Take 40 mg by mouth daily.    Historical Provider, MD  furosemide (LASIX) 20 MG tablet Take 40 mg by mouth daily.     Historical Provider, MD  gabapentin (NEURONTIN) 100 MG capsule Take 100 mg by mouth 3 (three) times daily.    Historical Provider, MD  hydrALAZINE (APRESOLINE) 25 MG tablet Take 25 mg by mouth 2 (two) times daily. Take another 25mg  if Systolic consistently over [id number] or dystolic over 90    Historical Provider, MD  hydrochlorothiazide (HYDRODIURIL) 25 MG tablet Take 25 mg by mouth daily.    Historical Provider, MD  ibuprofen (ADVIL,MOTRIN) 800 MG tablet Take 1 tablet (800 mg total) by mouth every 8 (eight) hours as needed. 06/22/16   Hubbard Robinson, MD  insulin aspart (NOVOLOG) 100 UNIT/ML injection Inject 12 Units into the skin 3 (three) times daily.     Historical Provider, MD  insulin glargine (LANTUS) 100 UNIT/ML injection Inject 0.25 mLs (25 Units total) into the skin at bedtime. 11/10/15   Nicholes Mango, MD  levocetirizine (XYZAL) 5 MG tablet Take 5 mg by mouth every evening.    Historical Provider, MD  lisinopril (PRINIVIL,ZESTRIL) 20 MG tablet Take 20 mg by mouth daily.    Historical Provider, MD  LORazepam (ATIVAN) 0.5 MG  tablet Take 1 tablet (0.5 mg total) by mouth every 8 (eight) hours as needed for anxiety. 02/17/16 02/16/17  Earleen Newport, MD  metoprolol (LOPRESSOR) 50 MG tablet Take 50 mg by mouth 2 (two) times daily.    Historical Provider, MD  omeprazole (PRILOSEC) 20 MG capsule Take 20 mg by mouth daily.    Historical Provider, MD  pioglitazone (ACTOS) 15 MG tablet Take 15 mg by mouth daily.    Historical Provider, MD  potassium chloride (K-DUR) 10 MEQ tablet Take 10 mEq by mouth daily.    Historical Provider, MD  predniSONE (DELTASONE) 5 MG tablet Take 2.5 mg by mouth daily.     Historical Provider, MD      PHYSICAL EXAMINATION:   VITAL SIGNS: Blood pressure (!) 140/51, pulse 85, temperature 97.8 F (36.6 C), temperature source Oral, resp. rate 19, height 5'  6" (1.676 m), weight 98 kg (216 lb), SpO2 100 %.  GENERAL:  70 y.o.-year-old patient lying in the bed with no acute distress.  EYES: Pupils equal, round, reactive to light and accommodation. No scleral icterus. Extraocular muscles intact.  HEENT: Head atraumatic, normocephalic. Oropharynx and nasopharynx clear. Mucosa dry. NECK:  Supple, no jugular venous distention. No thyroid enlargement, no tenderness.  LUNGS: Normal breath sounds bilaterally, no wheezing, rales,rhonchi or crepitation. No use of accessory muscles of respiration.  CARDIOVASCULAR: S1, S2 normal. No murmurs, rubs, or gallops.  ABDOMEN: Soft, nontender, nondistended. Bowel sounds present. No organomegaly or mass. Ileostomy bag in place, dressing in place, no visible leaking. EXTREMITIES: No pedal edema, cyanosis, or clubbing.  NEUROLOGIC: Cranial nerves II through XII are intact. Muscle strength 5/5 in all extremities. Sensation intact. Gait not checked.  PSYCHIATRIC: The patient is alert and oriented x 3.  SKIN: No obvious rash, lesion, or ulcer.   LABORATORY PANEL:   CBC  Recent Labs Lab 07/11/16 1221  WBC 9.5  HGB 8.4*  HCT 25.0*  PLT 249  MCV 88.2  MCH 29.7   MCHC 33.7  RDW 15.7*   ------------------------------------------------------------------------------------------------------------------  Chemistries   Recent Labs Lab 07/11/16 1221  NA 135  K 5.7*  CL 116*  CO2 13*  GLUCOSE 76  BUN 96*  CREATININE 4.61*  CALCIUM 9.9   ------------------------------------------------------------------------------------------------------------------ estimated creatinine clearance is 13.4 mL/min (by C-G formula based on SCr of 4.61 mg/dL (H)). ------------------------------------------------------------------------------------------------------------------ No results for input(s): TSH, T4TOTAL, T3FREE, THYROIDAB in the last 72 hours.  Invalid input(s): FREET3   Coagulation profile No results for input(s): INR, PROTIME in the last 168 hours. ------------------------------------------------------------------------------------------------------------------- No results for input(s): DDIMER in the last 72 hours. -------------------------------------------------------------------------------------------------------------------  Cardiac Enzymes  Recent Labs Lab 07/11/16 1221 07/11/16 1533  TROPONINI 0.10* 0.09*   ------------------------------------------------------------------------------------------------------------------ Invalid input(s): POCBNP  ---------------------------------------------------------------------------------------------------------------  Urinalysis    Component Value Date/Time   COLORURINE YELLOW (A) 07/11/2016 1221   APPEARANCEUR CLOUDY (A) 07/11/2016 1221   APPEARANCEUR Clear 04/23/2014 1137   LABSPEC 1.008 07/11/2016 1221   LABSPEC 1.004 04/23/2014 1137   PHURINE 5.0 07/11/2016 1221   GLUCOSEU NEGATIVE 07/11/2016 1221   GLUCOSEU Negative 04/23/2014 1137   HGBUR 1+ (A) 07/11/2016 1221   BILIRUBINUR NEGATIVE 07/11/2016 1221   BILIRUBINUR Negative 04/23/2014 1137   KETONESUR NEGATIVE 07/11/2016 1221    PROTEINUR 30 (A) 07/11/2016 1221   NITRITE NEGATIVE 07/11/2016 1221   LEUKOCYTESUR 3+ (A) 07/11/2016 1221   LEUKOCYTESUR Trace 04/23/2014 1137     RADIOLOGY: No results found.  EKG: Orders placed or performed during the hospital encounter of 07/11/16  . ED EKG  . ED EKG    IMPRESSION AND PLAN:  * Ac on Ch renal failure   Due to Dehydration    Check renal sonogram   IV fluids, Monitor func.   Avoid diuretics and nephrotoxins.   If does not improve, May need nephro consult.  * Hyperkalemia   Monitor on tele with IV fluids.  * nausea and vomit.    No fever or diarrhea.   IV zofran.   Surgical consutl as she had recent sx.  * Htn    Amlodipin, metorpolol. Hold other meds.  * DM   Decrease dose of lantus due to renal failure, monitor and keep on ISS.  All the records are reviewed and case discussed with ED provider. Management plans discussed with the patient, family and they are in agreement.  CODE STATUS: Full Code Status History  Date Active Date Inactive Code Status Order ID Comments User Context   06/08/2016  6:30 AM 06/22/2016  5:28 PM Full Code [id number]  Clayburn Pert, MD Inpatient   11/05/2015 10:26 AM 11/10/2015  5:22 PM Full Code [id number]  Saundra Shelling, MD ED   11/05/2015  7:14 AM 11/05/2015 10:26 AM Full Code [id number]  Saundra Shelling, MD ED       TOTAL TIME TAKING CARE OF THIS PATIENT: 50 minutes.    Vaughan Basta M.D on 07/11/2016   Between 7am to 6pm - Pager - [phone]  After 6pm go to [email or web address] - password [email or web address] [place] [facility]  Office  [phone]  CC: Primary care physician; Glendon Axe, MD   Note: This dictation was prepared with Dragon dictation along with smaller phrase technology. Any transcriptional errors that result from this process are unintentional.

## 2016-07-11 NOTE — ED Notes (Signed)
Report given to Theresa, RN

## 2016-07-12 ENCOUNTER — Inpatient Hospital Stay: Payer: Commercial Managed Care - HMO

## 2016-07-12 DIAGNOSIS — N189 Chronic kidney disease, unspecified: Secondary | ICD-10-CM

## 2016-07-12 DIAGNOSIS — N179 Acute kidney failure, unspecified: Secondary | ICD-10-CM

## 2016-07-12 LAB — CBC
HEMATOCRIT: 23.7 % — AB (ref 35.0–47.0)
Hemoglobin: 7.9 g/dL — ABNORMAL LOW (ref 12.0–16.0)
MCH: 30 pg (ref 26.0–34.0)
MCHC: 33.2 g/dL (ref 32.0–36.0)
MCV: 90.3 fL (ref 80.0–100.0)
PLATELETS: 215 10*3/uL (ref 150–440)
RBC: 2.63 MIL/uL — ABNORMAL LOW (ref 3.80–5.20)
RDW: 15.8 % — AB (ref 11.5–14.5)
WBC: 7.1 10*3/uL (ref 3.6–11.0)

## 2016-07-12 LAB — BASIC METABOLIC PANEL
Anion gap: 4 — ABNORMAL LOW (ref 5–15)
BUN: 87 mg/dL — AB (ref 6–20)
CHLORIDE: 120 mmol/L — AB (ref 101–111)
CO2: 12 mmol/L — AB (ref 22–32)
CREATININE: 3.69 mg/dL — AB (ref 0.44–1.00)
Calcium: 8.7 mg/dL — ABNORMAL LOW (ref 8.9–10.3)
GFR calc Af Amer: 13 mL/min — ABNORMAL LOW (ref >60)
GFR calc non Af Amer: 11 mL/min — ABNORMAL LOW (ref >60)
GLUCOSE: 232 mg/dL — AB (ref 65–99)
POTASSIUM: 6.6 mmol/L — AB (ref 3.5–5.1)
SODIUM: 136 mmol/L (ref 135–145)

## 2016-07-12 LAB — GLUCOSE, CAPILLARY
GLUCOSE-CAPILLARY: 117 mg/dL — AB (ref 65–99)
GLUCOSE-CAPILLARY: 194 mg/dL — AB (ref 65–99)
GLUCOSE-CAPILLARY: 164 mg/dL — AB (ref 65–99)
Glucose-Capillary: 209 mg/dL — ABNORMAL HIGH (ref 65–99)

## 2016-07-12 LAB — POTASSIUM
POTASSIUM: 5.7 mmol/L — AB (ref 3.5–5.1)
Potassium: 5.5 mmol/L — ABNORMAL HIGH (ref 3.5–5.1)

## 2016-07-12 MED ORDER — SODIUM CHLORIDE 0.9 % IV SOLN
INTRAVENOUS | Status: DC
  Administered 2016-07-12: 18:00:00 via INTRAVENOUS

## 2016-07-12 MED ORDER — SODIUM POLYSTYRENE SULFONATE 15 GM/60ML PO SUSP
60.0000 g | Freq: Once | ORAL | Status: AC
  Administered 2016-07-12: 60 g via ORAL
  Filled 2016-07-12: qty 240

## 2016-07-12 MED ORDER — SODIUM POLYSTYRENE SULFONATE 15 GM/60ML PO SUSP: 30.0000 g | Freq: Once | ORAL | Status: AC

## 2016-07-12 MED ORDER — SODIUM POLYSTYRENE SULFONATE 15 GM/60ML PO SUSP
30.0000 g | Freq: Once | ORAL | Status: AC
  Administered 2016-07-12: 30 g via ORAL
  Filled 2016-07-12 (×2): qty 120

## 2016-07-12 MED ORDER — SODIUM CHLORIDE 0.45 % IV SOLN
INTRAVENOUS | Status: DC
  Administered 2016-07-12: 23:00:00 via INTRAVENOUS

## 2016-07-12 NOTE — Progress Notes (Signed)
  Visit with patient was seen. She is alert and states she's been able to force more liquids today. She states she just does not have an appetite for eating. She denies any pain. She states she is feeling somewhat better than on admission but continues to feel lethargic.  On exam her abdomen is soft and nontender.  Reiterated plan to both patient, her son and her daughters who are on the phone. Plan for rehydration to allow for improvement of renal function. Plan to image her rectosigmoid anastomosis to ensure healing. If anastomosis is healed on imaging would then take down her ileostomy. This was all likely happen this hospital stay barring unforeseen complication. All questions answered to their satisfaction.  Clayburn Pert, MD [facility]

## 2016-07-12 NOTE — Progress Notes (Signed)
[facility] [place] NAME: [name]    MR#:  [id number]  DATE OF BIRTH:  [birth year]  SUBJECTIVE:  CHIEF COMPLAINT:   Chief Complaint  Patient presents with  . Weakness     Recent Ileostomy and came with dehydration and hyperkalemia. Renal func improved some with IV fluids and potassium was high this morning- given kayexalate. No c/o pain.  REVIEW OF SYSTEMS:  CONSTITUTIONAL: No fever, fatigue or weakness.  EYES: No blurred or double vision.  EARS, NOSE, AND THROAT: No tinnitus or ear pain.  RESPIRATORY: No cough, shortness of breath, wheezing or hemoptysis.  CARDIOVASCULAR: No chest pain, orthopnea, edema.  GASTROINTESTINAL: No nausea, vomiting, diarrhea or abdominal pain.  GENITOURINARY: No dysuria, hematuria.  ENDOCRINE: No polyuria, nocturia,  HEMATOLOGY: No anemia, easy bruising or bleeding SKIN: No rash or lesion. MUSCULOSKELETAL: No joint pain or arthritis.   NEUROLOGIC: No tingling, numbness, weakness.  PSYCHIATRY: No anxiety or depression.   ROS  DRUG ALLERGIES:   Allergies  Allergen Reactions  . Doxycycline Other (See Comments)    Reaction: unknown  . Levaquin [Levofloxacin] Other (See Comments)    Causes joint pain  . Penicillins Rash and Other (See Comments)    Has patient had a PCN reaction causing immediate rash, facial/tongue/throat swelling, SOB or lightheadedness with hypotension: Yes Has patient had a PCN reaction causing severe rash involving mucus membranes or skin necrosis: No Has patient had a PCN reaction that required hospitalization No Has patient had a PCN reaction occurring within the last 10 years: No If all of the above answers are "NO", then may proceed with Cephalosporin use.  . Sulfa Antibiotics Swelling and Rash    VITALS:  Blood pressure (!) 136/57, pulse 80, temperature 98.3 F (36.8 C), temperature source Oral, resp. rate 18, height 5\' 6"  (1.676 m), weight 98 kg (216 lb), SpO2 100  %.  PHYSICAL EXAMINATION:   GENERAL:  70 y.o.-year-old patient lying in the bed with no acute distress.  EYES: Pupils equal, round, reactive to light and accommodation. No scleral icterus. Extraocular muscles intact.  HEENT: Head atraumatic, normocephalic. Oropharynx and nasopharynx clear. Mucosa dry. NECK:  Supple, no jugular venous distention. No thyroid enlargement, no tenderness.  LUNGS: Normal breath sounds bilaterally, no wheezing, rales,rhonchi or crepitation. No use of accessory muscles of respiration.  CARDIOVASCULAR: S1, S2 normal. No murmurs, rubs, or gallops.  ABDOMEN: Soft, nontender, nondistended. Bowel sounds present. No organomegaly or mass. Ileostomy bag in place, dressing in place, no visible leaking. EXTREMITIES: No pedal edema, cyanosis, or clubbing.  NEUROLOGIC: Cranial nerves II through XII are intact. Muscle strength 5/5 in all extremities. Sensation intact. Gait not checked.  PSYCHIATRIC: The patient is alert and oriented x 3.  SKIN: No obvious rash, lesion, or ulcer.   Physical Exam LABORATORY PANEL:   CBC  Recent Labs Lab 07/12/16 0529  WBC 7.1  HGB 7.9*  HCT 23.7*  PLT 215   ------------------------------------------------------------------------------------------------------------------  Chemistries   Recent Labs Lab 07/12/16 0529  07/12/16 1355  NA 136  --   --   K 6.6*  < > 5.7*  CL 120*  --   --   CO2 12*  --   --   GLUCOSE 232*  --   --   BUN 87*  --   --   CREATININE 3.69*  --   --   CALCIUM 8.7*  --   --   < > = values in this interval  not displayed. ------------------------------------------------------------------------------------------------------------------  Cardiac Enzymes  Recent Labs Lab 07/11/16 1221 07/11/16 1533  TROPONINI 0.10* 0.09*   ------------------------------------------------------------------------------------------------------------------  RADIOLOGY:  US Renal  Result Date: 07/11/2016 CLINICAL DATA:   Acute on chronic renal failure, hypertension, diabetes mellitus, CHF EXAM: RENAL / URINARY TRACT ULTRASOUND COMPLETE COMPARISON:  CT abdomen and pelvis 06/16/2016, prior ultrasound 11/07/2015 FINDINGS: Right Kidney: Length: 10.3 cm. Normal cortical thickness. Slightly increased cortical echogenicity. Small cyst at upper pole 17 x 13 x 15 mm. Questionable nodule at inferior pole RIGHT kidney 2.6 x 1.7 x 2.0 cm containing a septation though no lesion is seen at this site on CT of 99991111, uncertain if this represents a subtle complicated cyst or artifact. No lesion at this site on prior ultrasound. No hydronephrosis or shadowing calcifications. Left Kidney: Length: 10.1 cm. Normal cortical thickness. Slightly increased cortical echogenicity. Question subtle hypoechoic nodule at the mid LEFT kidney laterally ; vague area of hypoattenuation seen on recent CT at this site. No hydronephrosis, additional mass or shadowing calcification Bladder: Normal appearance IMPRESSION: Medical renal disease changes of both kidneys. Small cyst at upper pole of RIGHT kidney. Potential subtle mass lesion at the lateral mid LEFT kidney with questionable complicated cystic lesion versus artifact at inferior pole RIGHT kidney. Confirmation and characterization of these lesions by MR without contrast recommended. Electronically Signed   By: Lavonia Dana M.D.   On: 07/11/2016 18:17   Dg Abd 2 Views  Result Date: 07/12/2016 CLINICAL DATA:  Small-bowel obstruction EXAM: ABDOMEN - 2 VIEW COMPARISON:  CT 06/16/2016. FINDINGS: Soft tissue structures are unremarkable. No bowel distention or free air. Small amount of previously administered oral contrast is noted in the right colon. No acute bony abnormality. Pelvic calcifications consistent with phleboliths. IMPRESSION: No acute or focal abnormality.  No bowel distention.  No free air. Electronically Signed   By: Ola   On: 07/12/2016 08:24    ASSESSMENT AND PLAN:   Principal  Problem:   Acute on chronic renal failure (HCC)   * Ac on Ch renal failure   Due to Dehydration    Checked renal sonogram   IV fluids, Monitor func.   Avoid diuretics and nephrotoxins.    Improved some.  * Hyperkalemia   Monitor on tele with IV fluids.   Kayexalate was given in morning, follow K again.  * nausea and vomit.    No fever or diarrhea.   IV zofran.   Surgical consutl as she had recent sx.   Xray abd without any obstructions. Appreciated help.  * Htn    Amlodipin, metorpolol. Hold other meds.  * DM   Decrease dose of lantus due to renal failure, monitor and keep on ISS.    All the records are reviewed and case discussed with Care Management/Social Workerr. Management plans discussed with the patient, family and they are in agreement.  CODE STATUS: full  TOTAL TIME TAKING CARE OF THIS PATIENT: 35 minutes.   POSSIBLE D/C IN 1-2 DAYS, DEPENDING ON CLINICAL CONDITION.   Vaughan Basta M.D on 07/12/2016   Between 7am to 6pm - Pager - [phone]  After 6pm go to [email or web address] - password [email or web address] [facility]  Office  [phone]  CC: Primary care physician; Glendon Axe, MD  Note: This dictation was prepared with Dragon dictation along with smaller phrase technology. Any transcriptional errors that result from this process are unintentional.

## 2016-07-12 NOTE — Progress Notes (Signed)
Dr Estanislado Pandy notified of potassium 6.6, orders given.

## 2016-07-12 NOTE — Progress Notes (Signed)
Subjective:   Her admission workup has been reviewed. She seems to be an increasing renal failure. I suspect that her kidney decline has been secondary to her high output ileostomy with incomplete replacement. Her creatinine has improved today. Her potassium is better today. She continues to have very high ileostomy output. Her plain films do not demonstrate any evidence of obstruction.  Vital signs in last 24 hours: Temp:  [97.9 F (36.6 C)-98.4 F (36.9 C)] 98 F (36.7 C) (09/18 0449) Pulse Rate:  [79-107] 82 (09/18 0939) Resp:  [4-21] 18 (09/18 0449) BP: (106-141)/(45-67) 117/65 (09/18 0449) SpO2:  [100 %] 100 % (09/18 0449) Last BM Date: 07/11/16 (colostomy)  Intake/Output from previous day: 09/17 0701 - 09/18 0700 In: 1848 [I.V.:1848] Out: 750 [Urine:750]  Exam:  Her abdomen is soft with no significant abnormalities noted. She is very lethargic this morning. She does have active bowel sounds.  Lab Results:  CBC  Recent Labs  07/11/16 1221 07/12/16 0529  WBC 9.5 7.1  HGB 8.4* 7.9*  HCT 25.0* 23.7*  PLT 249 215   CMP     Component Value Date/Time   NA 136 07/12/2016 0529   NA 145 05/22/2014 0712   K 5.5 (H) 07/12/2016 1145   K 3.9 05/22/2014 0712   CL 120 (H) 07/12/2016 0529   CL 110 (H) 05/22/2014 0712   CO2 12 (L) 07/12/2016 0529   CO2 29 05/22/2014 0712   GLUCOSE 232 (H) 07/12/2016 0529   GLUCOSE 61 (L) 05/22/2014 0712   BUN 87 (H) 07/12/2016 0529   BUN 28 (H) 05/22/2014 0712   CREATININE 3.69 (H) 07/12/2016 0529   CREATININE 1.75 (H) 05/22/2014 0712   CALCIUM 8.7 (L) 07/12/2016 0529   CALCIUM 7.9 (L) 05/22/2014 0712   PROT 7.1 06/21/2016 0458   PROT 6.6 05/20/2014 1246   ALBUMIN 2.6 (L) 06/21/2016 0458   ALBUMIN 3.0 (L) 05/20/2014 1246   AST 22 06/21/2016 0458   AST 14 (L) 05/20/2014 1246   ALT 16 06/21/2016 0458   ALT 27 05/20/2014 1246   ALKPHOS 68 06/21/2016 0458   ALKPHOS 54 05/20/2014 1246   BILITOT 0.4 06/21/2016 0458   BILITOT 0.6  05/20/2014 1246   GFRNONAA 11 (L) 07/12/2016 0529   GFRNONAA 29 (L) 05/22/2014 0712   GFRAA 13 (L) 07/12/2016 0529   GFRAA 34 (L) 05/22/2014 0712   PT/INR No results for input(s): LABPROT, INR in the last 72 hours.  Studies/Results: US Renal  Result Date: 07/11/2016 CLINICAL DATA:  Acute on chronic renal failure, hypertension, diabetes mellitus, CHF EXAM: RENAL / URINARY TRACT ULTRASOUND COMPLETE COMPARISON:  CT abdomen and pelvis 06/16/2016, prior ultrasound 11/07/2015 FINDINGS: Right Kidney: Length: 10.3 cm. Normal cortical thickness. Slightly increased cortical echogenicity. Small cyst at upper pole 17 x 13 x 15 mm. Questionable nodule at inferior pole RIGHT kidney 2.6 x 1.7 x 2.0 cm containing a septation though no lesion is seen at this site on CT of 99991111, uncertain if this represents a subtle complicated cyst or artifact. No lesion at this site on prior ultrasound. No hydronephrosis or shadowing calcifications. Left Kidney: Length: 10.1 cm. Normal cortical thickness. Slightly increased cortical echogenicity. Question subtle hypoechoic nodule at the mid LEFT kidney laterally ; vague area of hypoattenuation seen on recent CT at this site. No hydronephrosis, additional mass or shadowing calcification Bladder: Normal appearance IMPRESSION: Medical renal disease changes of both kidneys. Small cyst at upper pole of RIGHT kidney. Potential subtle mass lesion at the lateral mid  LEFT kidney with questionable complicated cystic lesion versus artifact at inferior pole RIGHT kidney. Confirmation and characterization of these lesions by MR without contrast recommended. Electronically Signed   By: Lavonia Dana M.D.   On: 07/11/2016 18:17   Dg Abd 2 Views  Result Date: 07/12/2016 CLINICAL DATA:  Small-bowel obstruction EXAM: ABDOMEN - 2 VIEW COMPARISON:  CT 06/16/2016. FINDINGS: Soft tissue structures are unremarkable. No bowel distention or free air. Small amount of previously administered oral contrast  is noted in the right colon. No acute bony abnormality. Pelvic calcifications consistent with phleboliths. IMPRESSION: No acute or focal abnormality.  No bowel distention.  No free air. Electronically Signed   By: Marcello Moores  Register   On: 07/12/2016 08:24    Assessment/Plan: I suspect the ultimate answer to this woman's problem would be to reverse her ostomy. If we can get her renal function back to baseline and improve her electrolytes I would recommend that we do a barium enema to assess the status of her colon resection and then make a decision about ileostomy reversal. With her Kayexalate to manage her elevated potassium her ileostomy outputs coming much greater so she will need high doses of IV fluid in order to avoid further problems with her creatinine function. We will talk with her at length. She was very lethargic but her son was present.

## 2016-07-12 NOTE — Care Management Important Message (Signed)
Important Message  Patient Details  Name: [name] MRN: [id number] Date of Birth: 10/22/1946   Medicare Important Message Given:  Yes    Shomari Matusik A, RN 07/12/2016, 7:58 AM

## 2016-07-13 DIAGNOSIS — E860 Dehydration: Secondary | ICD-10-CM

## 2016-07-13 LAB — BASIC METABOLIC PANEL
ANION GAP: 3 — AB (ref 5–15)
BUN: 70 mg/dL — ABNORMAL HIGH (ref 6–20)
CALCIUM: 9 mg/dL (ref 8.9–10.3)
CHLORIDE: 124 mmol/L — AB (ref 101–111)
CO2: 13 mmol/L — AB (ref 22–32)
Creatinine, Ser: 3.17 mg/dL — ABNORMAL HIGH (ref 0.44–1.00)
GFR calc non Af Amer: 14 mL/min — ABNORMAL LOW (ref >60)
GFR, EST AFRICAN AMERICAN: 16 mL/min — AB (ref >60)
GLUCOSE: 196 mg/dL — AB (ref 65–99)
POTASSIUM: 5.7 mmol/L — AB (ref 3.5–5.1)
SODIUM: 140 mmol/L (ref 135–145)

## 2016-07-13 LAB — GLUCOSE, CAPILLARY
GLUCOSE-CAPILLARY: 181 mg/dL — AB (ref 65–99)
GLUCOSE-CAPILLARY: 178 mg/dL — AB (ref 65–99)
GLUCOSE-CAPILLARY: 171 mg/dL — AB (ref 65–99)
Glucose-Capillary: 171 mg/dL — ABNORMAL HIGH (ref 65–99)

## 2016-07-13 MED ORDER — STERILE WATER FOR INJECTION IV SOLN
INTRAVENOUS | Status: DC
  Administered 2016-07-13 – 2016-07-18 (×9): via INTRAVENOUS
  Filled 2016-07-13 (×14): qty 9.71

## 2016-07-13 NOTE — Progress Notes (Signed)
Subjective:   She is feeling better today with less lethargy and no nausea. She's able tolerate a diet. She has no major complaints. Her creatinine is down near 3. Her electrolytes are improving with her potassium coming back toward normal. Her ostomy function remained significant.  Vital signs in last 24 hours: Temp:  [98.2 F (36.8 C)-98.3 F (36.8 C)] 98.2 F (36.8 C) (09/19 1354) Pulse Rate:  [62-80] 78 (09/19 1354) Resp:  [18] 18 (09/19 1354) BP: (112-136)/(50-61) 115/61 (09/19 1354) SpO2:  [100 %] 100 % (09/19 1354) Last BM Date: 07/13/16  Intake/Output from previous day: 09/18 0701 - 09/19 0700 In: 1703.2 [P.O.:300; I.V.:1403.2] Out: 4825 [Urine:550; [id number]  Exam:  Her exam is really unchanged.  Lab Results:  CBC  Recent Labs  07/11/16 1221 07/12/16 0529  WBC 9.5 7.1  HGB 8.4* 7.9*  HCT 25.0* 23.7*  PLT 249 215   CMP     Component Value Date/Time   NA 140 07/13/2016 0527   NA 145 05/22/2014 0712   K 5.7 (H) 07/13/2016 0527   K 3.9 05/22/2014 0712   CL 124 (H) 07/13/2016 0527   CL 110 (H) 05/22/2014 0712   CO2 13 (L) 07/13/2016 0527   CO2 29 05/22/2014 0712   GLUCOSE 196 (H) 07/13/2016 0527   GLUCOSE 61 (L) 05/22/2014 0712   BUN 70 (H) 07/13/2016 0527   BUN 28 (H) 05/22/2014 0712   CREATININE 3.17 (H) 07/13/2016 0527   CREATININE 1.75 (H) 05/22/2014 0712   CALCIUM 9.0 07/13/2016 0527   CALCIUM 7.9 (L) 05/22/2014 0712   PROT 7.1 06/21/2016 0458   PROT 6.6 05/20/2014 1246   ALBUMIN 2.6 (L) 06/21/2016 0458   ALBUMIN 3.0 (L) 05/20/2014 1246   AST 22 06/21/2016 0458   AST 14 (L) 05/20/2014 1246   ALT 16 06/21/2016 0458   ALT 27 05/20/2014 1246   ALKPHOS 68 06/21/2016 0458   ALKPHOS 54 05/20/2014 1246   BILITOT 0.4 06/21/2016 0458   BILITOT 0.6 05/20/2014 1246   GFRNONAA 14 (L) 07/13/2016 0527   GFRNONAA 29 (L) 05/22/2014 0712   GFRAA 16 (L) 07/13/2016 0527   GFRAA 34 (L) 05/22/2014 0712   PT/INR No results for input(s): LABPROT, INR in the  last 72 hours.  Studies/Results: US Renal  Result Date: 07/11/2016 CLINICAL DATA:  Acute on chronic renal failure, hypertension, diabetes mellitus, CHF EXAM: RENAL / URINARY TRACT ULTRASOUND COMPLETE COMPARISON:  CT abdomen and pelvis 06/16/2016, prior ultrasound 11/07/2015 FINDINGS: Right Kidney: Length: 10.3 cm. Normal cortical thickness. Slightly increased cortical echogenicity. Small cyst at upper pole 17 x 13 x 15 mm. Questionable nodule at inferior pole RIGHT kidney 2.6 x 1.7 x 2.0 cm containing a septation though no lesion is seen at this site on CT of 99991111, uncertain if this represents a subtle complicated cyst or artifact. No lesion at this site on prior ultrasound. No hydronephrosis or shadowing calcifications. Left Kidney: Length: 10.1 cm. Normal cortical thickness. Slightly increased cortical echogenicity. Question subtle hypoechoic nodule at the mid LEFT kidney laterally ; vague area of hypoattenuation seen on recent CT at this site. No hydronephrosis, additional mass or shadowing calcification Bladder: Normal appearance IMPRESSION: Medical renal disease changes of both kidneys. Small cyst at upper pole of RIGHT kidney. Potential subtle mass lesion at the lateral mid LEFT kidney with questionable complicated cystic lesion versus artifact at inferior pole RIGHT kidney. Confirmation and characterization of these lesions by MR without contrast recommended. Electronically Signed   By: Elta Guadeloupe  Thornton Papas M.D.   On: 07/11/2016 18:17   Dg Abd 2 Views  Result Date: 07/12/2016 CLINICAL DATA:  Small-bowel obstruction EXAM: ABDOMEN - 2 VIEW COMPARISON:  CT 06/16/2016. FINDINGS: Soft tissue structures are unremarkable. No bowel distention or free air. Small amount of previously administered oral contrast is noted in the right colon. No acute bony abnormality. Pelvic calcifications consistent with phleboliths. IMPRESSION: No acute or focal abnormality.  No bowel distention.  No free air. Electronically  Signed   By: Marcello Moores  Register   On: 07/12/2016 08:24    Assessment/Plan: I believe that her problems related to the large amount of ostomy output which dehydrates turn increases her renal insufficiency. Now that she is improving we will plan for a barium enema tomorrow to assess for distal anastomosis and it appears to be intact we'll consider surgical takedown of the ostomy. She is in agreement with this plan. Her son was present for the interview.

## 2016-07-13 NOTE — Progress Notes (Signed)
Initial Nutrition Assessment  DOCUMENTATION CODES:   Obesity unspecified  INTERVENTION:  -No nutrition interventions warranted at this time  NUTRITION DIAGNOSIS:   Inadequate oral intake related to nausea, poor appetite as evidenced by per patient/family report.  GOAL:   Patient will meet greater than or equal to 90% of their needs  MONITOR:   PO intake, Labs, I & O's, Skin, Weight trends  REASON FOR ASSESSMENT:   Malnutrition Screening Tool    ASSESSMENT:   [name]  is a 70 y.o. female with a known history of CHF, HLD, Htn, CKD, DM, CAD- recent admission to [facility] for diverticulitis and ileostomy was done, sent to rehab- recovered some. Went home 3-4 days ago. For last 4-5 days have nausea and vomit, not eating enough and having generalized back pain  Spoke with [name], Son at bedside. She endorses poor appetite starting Thursday, patient was unable to eat or drink much during this timespan, complaining of severe nausea. It seems to have resolved some today, patient had breakfast this morning consisting of potatoes, fruit, and some other food, she claims she ate "almost all of it," documented PO intake 50% in chart. She is to undergo ileostomy take down prior to discharge, Son asked about patient's dietary needs with take down prior to discharge. Explained pt should be able to tolerate all foods prior to discharge, but can be addressed if needed. Patient endorses a usual body weight of ~220#, per chart, she exhibits a 5#/2.3% insignificant wt loss over 3 weeks. Pt does not need ONS at this time, offered snacks if patient was hungry between meals but she is ok. Labs and medications reviewed: CBG 171, K 5.7 Prednisone NS w/ HCO3- @75mL /hr  Diet Order:  Diet heart healthy/carb modified Room service appropriate? Yes; Fluid consistency: Thin  Skin:  Reviewed, no issues  Last BM:  9/19  Height:   Ht Readings from Last 1 Encounters:  07/11/16 5\' 6"  (1.676 m)     Weight:   Wt Readings from Last 1 Encounters:  07/11/16 216 lb (98 kg)    Ideal Body Weight:  59.09 kg  BMI:  Body mass index is 34.86 kg/m.  Estimated Nutritional Needs:   Kcal:  [id number] calories  Protein:  75-95 gm  Fluid:  >/= 1.7L  EDUCATION NEEDS:   Education needs addressed  Satira Anis. Xeng Kucher, MS, RD LDN Inpatient Clinical Dietitian Pager [phone]

## 2016-07-13 NOTE — Progress Notes (Signed)
[place] [place] NAME: [name]    MR#:  [id number]  DATE OF BIRTH:  [birth year]  SUBJECTIVE:  CHIEF COMPLAINT:   Chief Complaint  Patient presents with  . Weakness     Recent Ileostomy and came with dehydration and hyperkalemia. Renal func improved some with IV fluids and potassium was high, no complains, tolerating diet.  REVIEW OF SYSTEMS:  CONSTITUTIONAL: No fever, fatigue or weakness.  EYES: No blurred or double vision.  EARS, NOSE, AND THROAT: No tinnitus or ear pain.  RESPIRATORY: No cough, shortness of breath, wheezing or hemoptysis.  CARDIOVASCULAR: No chest pain, orthopnea, edema.  GASTROINTESTINAL: No nausea, vomiting, diarrhea or abdominal pain.  GENITOURINARY: No dysuria, hematuria.  ENDOCRINE: No polyuria, nocturia,  HEMATOLOGY: No anemia, easy bruising or bleeding SKIN: No rash or lesion. MUSCULOSKELETAL: No joint pain or arthritis.   NEUROLOGIC: No tingling, numbness, weakness.  PSYCHIATRY: No anxiety or depression.   ROS  DRUG ALLERGIES:   Allergies  Allergen Reactions  . Doxycycline Other (See Comments)    Reaction: unknown  . Levaquin [Levofloxacin] Other (See Comments)    Causes joint pain  . Penicillins Rash and Other (See Comments)    Has patient had a PCN reaction causing immediate rash, facial/tongue/throat swelling, SOB or lightheadedness with hypotension: Yes Has patient had a PCN reaction causing severe rash involving mucus membranes or skin necrosis: No Has patient had a PCN reaction that required hospitalization No Has patient had a PCN reaction occurring within the last 10 years: No If all of the above answers are "NO", then may proceed with Cephalosporin use.  . Sulfa Antibiotics Swelling and Rash    VITALS:  Blood pressure 115/61, pulse 78, temperature 98.2 F (36.8 C), temperature source Oral, resp. rate 18, height 5\' 6"  (1.676 m), weight 98 kg (216 lb), SpO2 100 %.  PHYSICAL  EXAMINATION:   GENERAL:  70 y.o.-year-old patient lying in the bed with no acute distress.  EYES: Pupils equal, round, reactive to light and accommodation. No scleral icterus. Extraocular muscles intact.  HEENT: Head atraumatic, normocephalic. Oropharynx and nasopharynx clear. Mucosa dry. NECK:  Supple, no jugular venous distention. No thyroid enlargement, no tenderness.  LUNGS: Normal breath sounds bilaterally, no wheezing, rales,rhonchi or crepitation. No use of accessory muscles of respiration.  CARDIOVASCULAR: S1, S2 normal. No murmurs, rubs, or gallops.  ABDOMEN: Soft, nontender, nondistended. Bowel sounds present. No organomegaly or mass. Ileostomy bag in place, dressing in place, no visible leaking. EXTREMITIES: No pedal edema, cyanosis, or clubbing.  NEUROLOGIC: Cranial nerves II through XII are intact. Muscle strength 5/5 in all extremities. Sensation intact. Gait not checked.  PSYCHIATRIC: The patient is alert and oriented x 3.  SKIN: No obvious rash, lesion, or ulcer.   Physical Exam LABORATORY PANEL:   CBC  Recent Labs Lab 07/12/16 0529  WBC 7.1  HGB 7.9*  HCT 23.7*  PLT 215   ------------------------------------------------------------------------------------------------------------------  Chemistries   Recent Labs Lab 07/13/16 0527  NA 140  K 5.7*  CL 124*  CO2 13*  GLUCOSE 196*  BUN 70*  CREATININE 3.17*  CALCIUM 9.0   ------------------------------------------------------------------------------------------------------------------  Cardiac Enzymes  Recent Labs Lab 07/11/16 1221 07/11/16 1533  TROPONINI 0.10* 0.09*   ------------------------------------------------------------------------------------------------------------------  RADIOLOGY:  US Renal  Result Date: 07/11/2016 CLINICAL DATA:  Acute on chronic renal failure, hypertension, diabetes mellitus, CHF EXAM: RENAL / URINARY TRACT ULTRASOUND COMPLETE COMPARISON:  CT abdomen and pelvis  06/16/2016, prior ultrasound 11/07/2015 FINDINGS:  Right Kidney: Length: 10.3 cm. Normal cortical thickness. Slightly increased cortical echogenicity. Small cyst at upper pole 17 x 13 x 15 mm. Questionable nodule at inferior pole RIGHT kidney 2.6 x 1.7 x 2.0 cm containing a septation though no lesion is seen at this site on CT of [id number], uncertain if this represents a subtle complicated cyst or artifact. No lesion at this site on prior ultrasound. No hydronephrosis or shadowing calcifications. Left Kidney: Length: 10.1 cm. Normal cortical thickness. Slightly increased cortical echogenicity. Question subtle hypoechoic nodule at the mid LEFT kidney laterally ; vague area of hypoattenuation seen on recent CT at this site. No hydronephrosis, additional mass or shadowing calcification Bladder: Normal appearance IMPRESSION: Medical renal disease changes of both kidneys. Small cyst at upper pole of RIGHT kidney. Potential subtle mass lesion at the lateral mid LEFT kidney with questionable complicated cystic lesion versus artifact at inferior pole RIGHT kidney. Confirmation and characterization of these lesions by MR without contrast recommended. Electronically Signed   By: Lavonia Dana M.D.   On: 07/11/2016 18:17   Dg Abd 2 Views  Result Date: 07/12/2016 CLINICAL DATA:  Small-bowel obstruction EXAM: ABDOMEN - 2 VIEW COMPARISON:  CT 06/16/2016. FINDINGS: Soft tissue structures are unremarkable. No bowel distention or free air. Small amount of previously administered oral contrast is noted in the right colon. No acute bony abnormality. Pelvic calcifications consistent with phleboliths. IMPRESSION: No acute or focal abnormality.  No bowel distention.  No free air. Electronically Signed   By: [place]   On: 07/12/2016 08:24    ASSESSMENT AND PLAN:   Principal Problem:   Acute on chronic renal failure (HCC) Active Problems:   Dehydration   * Ac on Ch renal failure   Due to Dehydration   Checked renal  sonogram   IV fluids, Monitor func.   Avoid diuretics and nephrotoxins.    Improved some.  * non aniongap metabolic acidosis   Due to bicarb loss from ileostomy.   IV fluid with bicarb.   Surgery is planning to reverse ileostomy.  * Hyperkalemia   Monitor on tele with IV fluids.   K is still high. Monitor.  * nausea and vomit.    No fever or diarrhea.   IV zofran.   Surgical consult as she had recent sx.   Xray abd without any obstructions. Appreciated help.   Plan is to proceed with reversal of ileostomy once renal func improve.  * Htn    Amlodipin, metorpolol. Hold other meds.  * DM   Decrease dose of lantus due to renal failure, monitor and keep on ISS.    All the records are reviewed and case discussed with Care Management/Social Workerr. Management plans discussed with the patient, family and they are in agreement.  CODE STATUS: full  TOTAL TIME TAKING CARE OF THIS PATIENT: 35 minutes.  Discussed with Dr. Pat Patrick and pt's son.  POSSIBLE D/C IN 1-2 DAYS, DEPENDING ON CLINICAL CONDITION.   Vaughan Basta M.D on 07/13/2016   Between 7am to 6pm - Pager - [phone]  After 6pm go to [email or web address] - password [email or web address] [facility]  Office  [phone]  CC: Primary care physician; Glendon Axe, MD  Note: This dictation was prepared with Dragon dictation along with smaller phrase technology. Any transcriptional errors that result from this process are unintentional.

## 2016-07-13 NOTE — Care Management Note (Addendum)
Case Management Note  Patient Details  Name: [name] MRN: [id number] Date of Birth: November 21, 1945  Subjective/Objective:        70 yo [name] was admitted with acute renal failure. 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 Her husband is deceased and she plans to stay with her daughter Carlis Stable [phone] at [address], [place], [place]. after this hospital discharge. She is still receiving supplements to correct her electrolyte imbalance. [name] has a RW and a BSC at home. Her pharmacy is [facility] on [address]. PCP=Dr Glendon Axe. No home oxygen. No home health services. Daughter will provide transportation. Provided [name] with a list of home health providers which she wants to discuss with her daughters before making a choice.  Per Dr Anselm Jungling, once her electrolytes are at baseline, he will refer her to the surgeon to take down her ileostomy. Anticipate at least another week as an Inpatient. Case management will follow for discharge planning.              Action/Plan:   Expected Discharge Date:                  Expected Discharge Plan:     In-House Referral:     Discharge planning Services     Post Acute Care Choice:    Choice offered to:     DME Arranged:    DME Agency:     HH Arranged:    HH Agency:     Status of Service:     If discussed at [employer] of Stay Meetings, dates discussed:    Additional Comments:  Akiera Allbaugh A, RN 07/13/2016, 11:04 AM

## 2016-07-13 NOTE — Progress Notes (Signed)
PT Cancellation Note  Patient Details Name: JALLISA BOATNER MRN: [id number] DOB: 01-Mar-1946   Cancelled Treatment:    Reason Eval/Treat Not Completed: Other (comment) Pt potassium level this morning 5.42mmol/L. Per PT guidelines pt on hold. PT will return at a later time/date to complete the PT evaluation when medically appropriate.    Riley Nearing, SPT 07/13/2016, 8:12 AM

## 2016-07-14 ENCOUNTER — Inpatient Hospital Stay: Payer: Commercial Managed Care - HMO

## 2016-07-14 LAB — GLUCOSE, CAPILLARY
GLUCOSE-CAPILLARY: 231 mg/dL — AB (ref 65–99)
Glucose-Capillary: 111 mg/dL — ABNORMAL HIGH (ref 65–99)
Glucose-Capillary: 93 mg/dL (ref 65–99)

## 2016-07-14 LAB — BASIC METABOLIC PANEL
Anion gap: 3 — ABNORMAL LOW (ref 5–15)
BUN: 56 mg/dL — ABNORMAL HIGH (ref 6–20)
CALCIUM: 9.2 mg/dL (ref 8.9–10.3)
CO2: 17 mmol/L — AB (ref 22–32)
CREATININE: 2.55 mg/dL — AB (ref 0.44–1.00)
Chloride: 121 mmol/L — ABNORMAL HIGH (ref 101–111)
GFR calc non Af Amer: 18 mL/min — ABNORMAL LOW (ref >60)
GFR, EST AFRICAN AMERICAN: 21 mL/min — AB (ref >60)
GLUCOSE: 148 mg/dL — AB (ref 65–99)
Potassium: 5.1 mmol/L (ref 3.5–5.1)
Sodium: 141 mmol/L (ref 135–145)

## 2016-07-14 MED ORDER — CIPROFLOXACIN IN D5W 400 MG/200ML IV SOLN
400.0000 mg | INTRAVENOUS | Status: DC
  Filled 2016-07-14: qty 200

## 2016-07-14 NOTE — Progress Notes (Signed)
Subjective:   She feels well this morning with less lethargy and more energy. Her lab values are improved with her creatinine down to 0.5. Her potassium is improving.  Vital signs in last 24 hours: Temp:  [97.9 F (36.6 C)-98.4 F (36.9 C)] 98.4 F (36.9 C) (09/20 0500) Pulse Rate:  [62-78] 72 (09/20 0500) Resp:  [18-20] 20 (09/20 0500) BP: (108-122)/(50-87) 108/87 (09/20 0500) SpO2:  [100 %] 100 % (09/20 0500) Last BM Date: 07/13/16  Intake/Output from previous day: 09/19 0701 - 09/20 0700 In: 1961 [P.O.:720; I.V.:1241] Out: 2000 [Urine:700; Stool:1300]  Exam:  Her exam is unchanged.  Lab Results:  CBC  Recent Labs  07/11/16 1221 07/12/16 0529  WBC 9.5 7.1  HGB 8.4* 7.9*  HCT 25.0* 23.7*  PLT 249 215   CMP     Component Value Date/Time   NA 141 07/14/2016 0438   NA 145 05/22/2014 0712   K 5.1 07/14/2016 0438   K 3.9 05/22/2014 0712   CL 121 (H) 07/14/2016 0438   CL 110 (H) 05/22/2014 0712   CO2 17 (L) 07/14/2016 0438   CO2 29 05/22/2014 0712   GLUCOSE 148 (H) 07/14/2016 0438   GLUCOSE 61 (L) 05/22/2014 0712   BUN 56 (H) 07/14/2016 0438   BUN 28 (H) 05/22/2014 0712   CREATININE 2.55 (H) 07/14/2016 0438   CREATININE 1.75 (H) 05/22/2014 0712   CALCIUM 9.2 07/14/2016 0438   CALCIUM 7.9 (L) 05/22/2014 0712   PROT 7.1 06/21/2016 0458   PROT 6.6 05/20/2014 1246   ALBUMIN 2.6 (L) 06/21/2016 0458   ALBUMIN 3.0 (L) 05/20/2014 1246   AST 22 06/21/2016 0458   AST 14 (L) 05/20/2014 1246   ALT 16 06/21/2016 0458   ALT 27 05/20/2014 1246   ALKPHOS 68 06/21/2016 0458   ALKPHOS 54 05/20/2014 1246   BILITOT 0.4 06/21/2016 0458   BILITOT 0.6 05/20/2014 1246   GFRNONAA 18 (L) 07/14/2016 0438   GFRNONAA 29 (L) 05/22/2014 0712   GFRAA 21 (L) 07/14/2016 0438   GFRAA 34 (L) 05/22/2014 0712   PT/INR No results for input(s): LABPROT, INR in the last 72 hours.  Studies/Results: Dg Abd 2 Views  Result Date: 07/12/2016 CLINICAL DATA:  Small-bowel obstruction EXAM:  ABDOMEN - 2 VIEW COMPARISON:  CT 06/16/2016. FINDINGS: Soft tissue structures are unremarkable. No bowel distention or free air. Small amount of previously administered oral contrast is noted in the right colon. No acute bony abnormality. Pelvic calcifications consistent with phleboliths. IMPRESSION: No acute or focal abnormality.  No bowel distention.  No free air. Electronically Signed   By: Marcello Moores  Register   On: 07/12/2016 08:24    Assessment/Plan: We plan a barium enema this morning to assess her anastomosis and possibly consider surgical repair of her ileostomy tomorrow. I discussed this with the family and they are in agreement.

## 2016-07-14 NOTE — Progress Notes (Signed)
[name] [place] NAME: [name]    MR#:  [id number]  DATE OF BIRTH:  [birth year]  SUBJECTIVE:  CHIEF COMPLAINT:   Chief Complaint  Patient presents with  . Weakness     Recent Ileostomy and came with dehydration and hyperkalemia. Renal func improved some with IV fluids and potassium was high, no complains, tolerating diet. Surgery is planning to do reversal of ileostomy tomorrow.  REVIEW OF SYSTEMS:  CONSTITUTIONAL: No fever, fatigue or weakness.  EYES: No blurred or double vision.  EARS, NOSE, AND THROAT: No tinnitus or ear pain.  RESPIRATORY: No cough, shortness of breath, wheezing or hemoptysis.  CARDIOVASCULAR: No chest pain, orthopnea, edema.  GASTROINTESTINAL: No nausea, vomiting, diarrhea or abdominal pain.  GENITOURINARY: No dysuria, hematuria.  ENDOCRINE: No polyuria, nocturia,  HEMATOLOGY: No anemia, easy bruising or bleeding SKIN: No rash or lesion. MUSCULOSKELETAL: No joint pain or arthritis.   NEUROLOGIC: No tingling, numbness, weakness.  PSYCHIATRY: No anxiety or depression.   ROS  DRUG ALLERGIES:   Allergies  Allergen Reactions  . Doxycycline Other (See Comments)    Reaction: unknown  . Levaquin [Levofloxacin] Other (See Comments)    Causes joint pain  . Penicillins Rash and Other (See Comments)    Has patient had a PCN reaction causing immediate rash, facial/tongue/throat swelling, SOB or lightheadedness with hypotension: Yes Has patient had a PCN reaction causing severe rash involving mucus membranes or skin necrosis: No Has patient had a PCN reaction that required hospitalization No Has patient had a PCN reaction occurring within the last 10 years: No If all of the above answers are "NO", then may proceed with Cephalosporin use.  . Sulfa Antibiotics Swelling and Rash    VITALS:  Blood pressure (!) 136/57, pulse 66, temperature 98.6 F (37 C), resp. rate 18, height 5\' 6"  (1.676 m), weight 98 kg (216  lb), SpO2 100 %.  PHYSICAL EXAMINATION:   GENERAL:  70 y.o.-year-old patient lying in the bed with no acute distress.  EYES: Pupils equal, round, reactive to light and accommodation. No scleral icterus. Extraocular muscles intact.  HEENT: Head atraumatic, normocephalic. Oropharynx and nasopharynx clear. Mucosa dry. NECK:  Supple, no jugular venous distention. No thyroid enlargement, no tenderness.  LUNGS: Normal breath sounds bilaterally, no wheezing, rales,rhonchi or crepitation. No use of accessory muscles of respiration.  CARDIOVASCULAR: S1, S2 normal. No murmurs, rubs, or gallops.  ABDOMEN: Soft, nontender, nondistended. Bowel sounds present. No organomegaly or mass. Ileostomy bag in place, dressing in place, no visible leaking. EXTREMITIES: No pedal edema, cyanosis, or clubbing.  NEUROLOGIC: Cranial nerves II through XII are intact. Muscle strength 5/5 in all extremities. Sensation intact. Gait not checked.  PSYCHIATRIC: The patient is alert and oriented x 3.  SKIN: No obvious rash, lesion, or ulcer.   Physical Exam LABORATORY PANEL:   CBC  Recent Labs Lab 07/12/16 0529  WBC 7.1  HGB 7.9*  HCT 23.7*  PLT 215   ------------------------------------------------------------------------------------------------------------------  Chemistries   Recent Labs Lab 07/14/16 0438  NA 141  K 5.1  CL 121*  CO2 17*  GLUCOSE 148*  BUN 56*  CREATININE 2.55*  CALCIUM 9.2   ------------------------------------------------------------------------------------------------------------------  Cardiac Enzymes  Recent Labs Lab 07/11/16 1221 07/11/16 1533  TROPONINI 0.10* 0.09*   ------------------------------------------------------------------------------------------------------------------  RADIOLOGY:  Dg Colon W/cm Ltd  Result Date: 07/14/2016 CLINICAL DATA:  Partial sigmoid colon resection. Revaluation of the sigmoid colon anastomosis. EXAM: BE LIMITED WITH CONTRAST  FLUOROSCOPY TIME:  Fluoroscopy Time:  1 minutes Radiation Exposure Index (if provided by the fluoroscopic device): 69.4 mGy Number of Acquired Spot Images: 7 COMPARISON:  CT abdomen 06/16/2016 FINDINGS: Single contrast barium enema was performed. An enema catheter was inserted and retention balloon distended under fluoroscopy. Barium was introduced with satisfactory demonstration of the rectum, sigmoid colon and descending colon. Sigmoid colon anastomotic site is identified in the left lower quadrant just inferior to the left sacroiliac joint. There is no active extravasation of contrast to suggest a leak. There is a small area of contrast protruding from the lumen which correlates with the anastomotic suture line when compared with the CT abdomen 06/16/2016 and the scout images. Examination was negative for an annular constricting mass lesion. There was no evidence of colonic stricture or obstruction. IMPRESSION: Sigmoid colon anastomotic site is identified in the left lower quadrant just inferior to the left sacroiliac joint. There is no active extravasation of contrast to suggest a leak. There is a small area of contrast protruding from the lumen which correlates with the anastomotic suture line when compared with the CT abdomen 06/16/2016 and the scout images most consistent with postsurgical changes. Electronically Signed   By: Kathreen Devoid   On: 07/14/2016 10:34    ASSESSMENT AND PLAN:   Principal Problem:   Acute on chronic renal failure (HCC) Active Problems:   Dehydration   * Ac on Ch renal failure   Due to Dehydration   Checked renal sonogram   IV fluids, Monitor func.   Avoid diuretics and nephrotoxins.    Improved some.  * non aniongap metabolic acidosis   Due to bicarb loss from ileostomy.   IV fluid with bicarb.Improving.   Surgery is planning to reverse ileostomy.   Ordered barium enema today to check healing.  * Hyperkalemia   Monitor on tele with IV fluids.   K is still  high. Monitor.  slowly coming down.  * nausea and vomit.    No fever or diarrhea.   IV zofran.   Surgical consult as she had recent sx.   Xray abd without any obstructions. Appreciated help.   Plan is to proceed with reversal of ileostomy once renal func improve, on 07/15/16.  * Htn    Amlodipin, metorpolol. Hold other meds.  * DM   Decrease dose of lantus due to renal failure, monitor and keep on ISS.    All the records are reviewed and case discussed with Care Management/Social Workerr. Management plans discussed with the patient, family and they are in agreement.  CODE STATUS: full  TOTAL TIME TAKING CARE OF THIS PATIENT: 35 minutes.  Discussed with Dr. Pat Patrick and pt's son.  POSSIBLE D/C IN 1-2 DAYS, DEPENDING ON CLINICAL CONDITION.   Vaughan Basta M.D on 07/14/2016   Between 7am to 6pm - Pager - [phone]  After 6pm go to [email or web address] - password [email or web address] [facility]  Office  [phone]  CC: Primary care physician; Glendon Axe, MD  Note: This dictation was prepared with Dragon dictation along with smaller phrase technology. Any transcriptional errors that result from this process are unintentional.

## 2016-07-14 NOTE — Progress Notes (Signed)
PT Cancellation Note  Patient Details Name: [name] MRN: [id number] DOB: 04/14/1946   Cancelled Treatment:    Reason Eval/Treat Not Completed: Other (comment)Per. Surgical note Dr. Pat Patrick, possible surgical repair of ileostomy tomorrow. Spoke with pt's nurse to confirm surgical plan. RN states surgical intervention is probable but not confirmed, RN reports pt. Has been up in chair for the majority of the day and is in agreement to hold PT until POC is confirmed. Will hold PT evaluation at this time, will continue to assess and confirm POC tomorrow.    Melanie Crazier 07/14/2016, 2:54 PM

## 2016-07-14 NOTE — Progress Notes (Signed)
Barium enema today does not reveal any evidence of leak and appears to have normal healing from her surgical procedure. I do believe her laboratory values have improved and so we will plan to take her to surgery tomorrow for a colostomy reversal. This plans been discussed with the patient and her son and they're in agreement.

## 2016-07-15 ENCOUNTER — Encounter
Admission: EM | Disposition: A | Discharge status: Home / Self Care | Payer: Self-pay | Source: Home / Self Care | Attending: Surgery

## 2016-07-15 ENCOUNTER — Inpatient Hospital Stay: Payer: Commercial Managed Care - HMO | Admitting: Anesthesiology

## 2016-07-15 ENCOUNTER — Encounter: Payer: Self-pay | Admitting: *Deleted

## 2016-07-15 DIAGNOSIS — K5720 Diverticulitis of large intestine with perforation and abscess without bleeding: Secondary | ICD-10-CM

## 2016-07-15 DIAGNOSIS — N179 Acute kidney failure, unspecified: Secondary | ICD-10-CM

## 2016-07-15 DIAGNOSIS — N189 Chronic kidney disease, unspecified: Secondary | ICD-10-CM

## 2016-07-15 HISTORY — PX: ILEOSTOMY CLOSURE: SHX1784

## 2016-07-15 LAB — CBC
HCT: 21.4 % — ABNORMAL LOW (ref 35.0–47.0)
Hemoglobin: 7.2 g/dL — ABNORMAL LOW (ref 12.0–16.0)
MCH: 29.5 pg (ref 26.0–34.0)
MCHC: 33.7 g/dL (ref 32.0–36.0)
MCV: 87.5 fL (ref 80.0–100.0)
PLATELETS: 197 10*3/uL (ref 150–440)
RBC: 2.45 MIL/uL — AB (ref 3.80–5.20)
RDW: 15.3 % — ABNORMAL HIGH (ref 11.5–14.5)
WBC: 8 10*3/uL (ref 3.6–11.0)

## 2016-07-15 LAB — BASIC METABOLIC PANEL
ANION GAP: 3 — AB (ref 5–15)
BUN: 45 mg/dL — AB (ref 6–20)
CO2: 22 mmol/L (ref 22–32)
Calcium: 9 mg/dL (ref 8.9–10.3)
Chloride: 117 mmol/L — ABNORMAL HIGH (ref 101–111)
Creatinine, Ser: 2.22 mg/dL — ABNORMAL HIGH (ref 0.44–1.00)
GFR, EST AFRICAN AMERICAN: 25 mL/min — AB (ref >60)
GFR, EST NON AFRICAN AMERICAN: 21 mL/min — AB (ref >60)
Glucose, Bld: 172 mg/dL — ABNORMAL HIGH (ref 65–99)
POTASSIUM: 5.1 mmol/L (ref 3.5–5.1)
SODIUM: 142 mmol/L (ref 135–145)

## 2016-07-15 LAB — SURGICAL PCR SCREEN
MRSA, PCR: NEGATIVE
Staphylococcus aureus: NEGATIVE

## 2016-07-15 LAB — GLUCOSE, CAPILLARY
GLUCOSE-CAPILLARY: 130 mg/dL — AB (ref 65–99)
GLUCOSE-CAPILLARY: 221 mg/dL — AB (ref 65–99)
Glucose-Capillary: 139 mg/dL — ABNORMAL HIGH (ref 65–99)
Glucose-Capillary: 276 mg/dL — ABNORMAL HIGH (ref 65–99)

## 2016-07-15 LAB — HEMOGLOBIN AND HEMATOCRIT, BLOOD
HEMATOCRIT: 33.7 % — AB (ref 35.0–47.0)
HEMOGLOBIN: 11.6 g/dL — AB (ref 12.0–16.0)

## 2016-07-15 LAB — PREPARE RBC (CROSSMATCH)

## 2016-07-15 SURGERY — CLOSURE, ILEOSTOMY
Anesthesia: General | Wound class: Clean Contaminated

## 2016-07-15 MED ORDER — MIDAZOLAM HCL 2 MG/2ML IJ SOLN
INTRAMUSCULAR | Status: DC | PRN
  Administered 2016-07-15: 2 mg via INTRAVENOUS

## 2016-07-15 MED ORDER — FENTANYL CITRATE (PF) 100 MCG/2ML IJ SOLN
INTRAMUSCULAR | Status: AC
  Administered 2016-07-15: 25 ug via INTRAVENOUS
  Filled 2016-07-15: qty 2

## 2016-07-15 MED ORDER — ONDANSETRON HCL 4 MG/2ML IJ SOLN
INTRAMUSCULAR | Status: DC | PRN
  Administered 2016-07-15: 4 mg via INTRAVENOUS

## 2016-07-15 MED ORDER — HYDROCODONE-ACETAMINOPHEN 5-325 MG PO TABS
1.0000 | ORAL_TABLET | Freq: Four times a day (QID) | ORAL | Status: DC | PRN
  Administered 2016-07-15 – 2016-07-18 (×2): 2 via ORAL
  Filled 2016-07-15 (×2): qty 2

## 2016-07-15 MED ORDER — LIDOCAINE HCL (CARDIAC) 20 MG/ML IV SOLN
INTRAVENOUS | Status: DC | PRN
  Administered 2016-07-15: 30 mg via INTRAVENOUS

## 2016-07-15 MED ORDER — DEXAMETHASONE SODIUM PHOSPHATE 10 MG/ML IJ SOLN
INTRAMUSCULAR | Status: DC | PRN
  Administered 2016-07-15: 10 mg via INTRAVENOUS

## 2016-07-15 MED ORDER — FENTANYL CITRATE (PF) 100 MCG/2ML IJ SOLN
INTRAMUSCULAR | Status: DC | PRN
  Administered 2016-07-15: 50 ug via INTRAVENOUS
  Administered 2016-07-15: 150 ug via INTRAVENOUS

## 2016-07-15 MED ORDER — ONDANSETRON HCL 4 MG/2ML IJ SOLN: 4.0000 mg | Freq: Once | INTRAMUSCULAR | Status: DC | PRN

## 2016-07-15 MED ORDER — HYDRALAZINE HCL 20 MG/ML IJ SOLN: 10.0000 mg | Freq: Four times a day (QID) | INTRAMUSCULAR | Status: DC | PRN

## 2016-07-15 MED ORDER — SODIUM CHLORIDE 0.9 % IV BOLUS (SEPSIS)
550.0000 mL | Freq: Once | INTRAVENOUS | Status: AC
  Administered 2016-07-15: 550 mL via INTRAVENOUS

## 2016-07-15 MED ORDER — HYDROMORPHONE HCL 1 MG/ML IJ SOLN: 1.0000 mg | INTRAMUSCULAR | Status: DC | PRN

## 2016-07-15 MED ORDER — LISINOPRIL 20 MG PO TABS
40.0000 mg | ORAL_TABLET | Freq: Every day | ORAL | Status: DC
  Administered 2016-07-15: 40 mg via ORAL
  Filled 2016-07-15: qty 2

## 2016-07-15 MED ORDER — ROCURONIUM BROMIDE 100 MG/10ML IV SOLN
INTRAVENOUS | Status: DC | PRN
  Administered 2016-07-15: 35 mg via INTRAVENOUS
  Administered 2016-07-15: 5 mg via INTRAVENOUS
  Administered 2016-07-15: 10 mg via INTRAVENOUS

## 2016-07-15 MED ORDER — SUGAMMADEX SODIUM 200 MG/2ML IV SOLN
INTRAVENOUS | Status: DC | PRN
  Administered 2016-07-15: 196 mg via INTRAVENOUS

## 2016-07-15 MED ORDER — PROPOFOL 10 MG/ML IV BOLUS
INTRAVENOUS | Status: DC | PRN
  Administered 2016-07-15: 120 mg via INTRAVENOUS

## 2016-07-15 MED ORDER — SODIUM CHLORIDE 0.9 % IV SOLN
INTRAVENOUS | Status: DC | PRN
  Administered 2016-07-15: 10:00:00 via INTRAVENOUS

## 2016-07-15 MED ORDER — FENTANYL CITRATE (PF) 100 MCG/2ML IJ SOLN
25.0000 ug | INTRAMUSCULAR | Status: DC | PRN
  Administered 2016-07-15 (×3): 25 ug via INTRAVENOUS

## 2016-07-15 MED ORDER — SODIUM CHLORIDE 0.9 % IV SOLN
INTRAVENOUS | Status: DC | PRN
  Administered 2016-07-15: 11:00:00 via INTRAVENOUS

## 2016-07-15 SURGICAL SUPPLY — 56 items
BULB RESERV EVAC DRAIN JP 100C (MISCELLANEOUS) ×3 IMPLANT
CANISTER SUCT 1200ML W/VALVE (MISCELLANEOUS) ×3 IMPLANT
CATH TRAY 16F METER LATEX (MISCELLANEOUS) ×3 IMPLANT
CLEANER CAUTERY TIP 5X5 PAD (MISCELLANEOUS) ×1 IMPLANT
DRAIN CHANNEL JP 19F (MISCELLANEOUS) ×3 IMPLANT
DRAPE INCISE IOBAN 66X45 STRL (DRAPES) ×3 IMPLANT
DRAPE LAPAROTOMY 100X77 ABD (DRAPES) IMPLANT
DRAPE LEGGINS SURG 28X43 STRL (DRAPES) IMPLANT
DRAPE TABLE BACK 80X90 (DRAPES) IMPLANT
DRAPE UNDER BUTTOCK W/FLU (DRAPES) IMPLANT
DRAPE UTILITY 15X26 TOWEL STRL (DRAPES) IMPLANT
DRSG OPSITE POSTOP 3X4 (GAUZE/BANDAGES/DRESSINGS) ×3 IMPLANT
DRSG OPSITE POSTOP 4X14 (GAUZE/BANDAGES/DRESSINGS) IMPLANT
DRSG OPSITE POSTOP 4X6 (GAUZE/BANDAGES/DRESSINGS) IMPLANT
DRSG OPSITE POSTOP 4X8 (GAUZE/BANDAGES/DRESSINGS) ×3 IMPLANT
ELECT BLADE 6.5 EXT (BLADE) IMPLANT
ELECT CAUTERY BLADE 6.4 (BLADE) ×3 IMPLANT
ELECT CAUTERY NEEDLE TIP 1.0 (MISCELLANEOUS) ×3
ELECT REM PT RETURN 9FT ADLT (ELECTROSURGICAL) ×3
ELECTRODE CAUTERY NEDL TIP 1.0 (MISCELLANEOUS) ×1 IMPLANT
ELECTRODE REM PT RTRN 9FT ADLT (ELECTROSURGICAL) ×1 IMPLANT
GLOVE BIO SURGEON STRL SZ7.5 (GLOVE) ×15 IMPLANT
GLOVE INDICATOR 8.0 STRL GRN (GLOVE) ×12 IMPLANT
GOWN STRL REUS W/ TWL LRG LVL3 (GOWN DISPOSABLE) ×5 IMPLANT
GOWN STRL REUS W/ TWL XL LVL3 (GOWN DISPOSABLE) ×2 IMPLANT
GOWN STRL REUS W/TWL LRG LVL3 (GOWN DISPOSABLE) ×10
GOWN STRL REUS W/TWL XL LVL3 (GOWN DISPOSABLE) ×4
LABEL OR SOLS (LABEL) ×3 IMPLANT
LIGASURE MARYLAND LAP STAND (ELECTROSURGICAL) ×3 IMPLANT
NS IRRIG 1000ML POUR BTL (IV SOLUTION) ×3 IMPLANT
PACK BASIN MAJOR ARMC (MISCELLANEOUS) ×3 IMPLANT
PACK COLON CLEAN CLOSURE (MISCELLANEOUS) IMPLANT
PAD CLEANER CAUTERY TIP 5X5 (MISCELLANEOUS) ×2
RELOAD LINEAR CUT PROX 55 BLUE (ENDOMECHANICALS) ×3 IMPLANT
RETAINER VISCERA MED (MISCELLANEOUS) IMPLANT
SOL PREP PVP 2OZ (MISCELLANEOUS) ×3
SOLUTION PREP PVP 2OZ (MISCELLANEOUS) ×1 IMPLANT
SPONGE DRAIN TRACH 4X4 STRL 2S (GAUZE/BANDAGES/DRESSINGS) ×3 IMPLANT
SPONGE LAP 18X18 5 PK (GAUZE/BANDAGES/DRESSINGS) IMPLANT
STAPLER GUN LINEAR PROX 60 (STAPLE) ×3 IMPLANT
STAPLER PROXIMATE 55 BLUE (STAPLE) ×3 IMPLANT
STAPLER SKIN PROX 35W (STAPLE) ×3 IMPLANT
SUT BOLSTER W/RETENTN TUBE (SUTURE) ×3 IMPLANT
SUT ETHILON 3-0 FS-10 30 BLK (SUTURE) ×3
SUT ETHILON NAB BLK LR #2 30IN (SUTURE) ×3 IMPLANT
SUT NYLON 2-0 (SUTURE) ×6 IMPLANT
SUT PDS AB 1 TP1 96 (SUTURE) ×3 IMPLANT
SUT PROLENE 1 CT (SUTURE) ×12 IMPLANT
SUT SILK 3-0 (SUTURE) ×3 IMPLANT
SUT VIC AB 1 CTX 27 (SUTURE) IMPLANT
SUT VIC AB 3-0 SH 27 (SUTURE) ×4
SUT VIC AB 3-0 SH 27X BRD (SUTURE) ×2 IMPLANT
SUT VICRYL PLUS ABS 0 54 (SUTURE) IMPLANT
SUT VICRYL+ 3-0 144IN (SUTURE) ×3 IMPLANT
SUTURE EHLN 3-0 FS-10 30 BLK (SUTURE) ×1 IMPLANT
SYR BULB IRRIG 60ML STRL (SYRINGE) IMPLANT

## 2016-07-15 NOTE — Progress Notes (Signed)
Dr. Adonis Huguenin notified of Hgb 7.2; Type and screen ordered. Barbaraann Faster, RN 07/15/2016 5:51 AM

## 2016-07-15 NOTE — Progress Notes (Signed)
[facility] [place] NAME: [name]    MR#:  [id number]  DATE OF BIRTH:  28-May-1946  SUBJECTIVE:  CHIEF COMPLAINT:   Chief Complaint  Patient presents with  . Weakness   Nausea and vomiting have resolved. Patient had ileostomy reversal earlier today.  REVIEW OF SYSTEMS:  CONSTITUTIONAL: No fever, fatigue or weakness.  EYES: No blurred or double vision.  EARS, NOSE, AND THROAT: No tinnitus or ear pain.  RESPIRATORY: No cough, shortness of breath, wheezing or hemoptysis.  CARDIOVASCULAR: No chest pain, orthopnea, edema.  GASTROINTESTINAL: No nausea, vomiting, diarrhea or abdominal pain.  GENITOURINARY: No dysuria, hematuria.  ENDOCRINE: No polyuria, nocturia,  HEMATOLOGY: No anemia, easy bruising or bleeding SKIN: No rash or lesion. MUSCULOSKELETAL: No joint pain or arthritis.   NEUROLOGIC: No tingling, numbness, weakness.  PSYCHIATRY: No anxiety or depression.   ROS  DRUG ALLERGIES:   Allergies  Allergen Reactions  . Doxycycline Other (See Comments)    Reaction: unknown  . Levaquin [Levofloxacin] Other (See Comments)    Causes joint pain  . Penicillins Rash and Other (See Comments)    Has patient had a PCN reaction causing immediate rash, facial/tongue/throat swelling, SOB or lightheadedness with hypotension: Yes Has patient had a PCN reaction causing severe rash involving mucus membranes or skin necrosis: No Has patient had a PCN reaction that required hospitalization No Has patient had a PCN reaction occurring within the last 10 years: No If all of the above answers are "NO", then may proceed with Cephalosporin use.  . Sulfa Antibiotics Swelling and Rash    VITALS:  Blood pressure (!) 170/72, pulse 70, temperature 98 F (36.7 C), temperature source Oral, resp. rate 16, height 5\' 6"  (1.676 m), weight 98 kg (216 lb), SpO2 100 %.  PHYSICAL EXAMINATION:   GENERAL:  70 y.o.-year-old patient lying in the bed with no acute  distress.  EYES: Pupils equal, round, reactive to light and accommodation. No scleral icterus. Extraocular muscles intact.  HEENT: Head atraumatic, normocephalic. Oropharynx and nasopharynx clear. Mucosa dry. NECK:  Supple, no jugular venous distention. No thyroid enlargement, no tenderness.  LUNGS: Normal breath sounds bilaterally, no wheezing, rales,rhonchi or crepitation. No use of accessory muscles of respiration.  CARDIOVASCULAR: S1, S2 normal. No murmurs, rubs, or gallops.  ABDOMEN: Dressing over surgical site with JP drain EXTREMITIES: No pedal edema, cyanosis, or clubbing.  NEUROLOGIC: Cranial nerves II through XII are intact. Muscle strength 5/5 in all extremities. Sensation intact. Gait not checked.  PSYCHIATRIC: The patient is alert and oriented x 3.  SKIN: No obvious rash, lesion, or ulcer.   Physical Exam LABORATORY PANEL:   CBC  Recent Labs Lab 07/15/16 0438  WBC 8.0  HGB 7.2*  HCT 21.4*  PLT 197   ------------------------------------------------------------------------------------------------------------------  Chemistries   Recent Labs Lab 07/15/16 0438  NA 142  K 5.1  CL 117*  CO2 22  GLUCOSE 172*  BUN 45*  CREATININE 2.22*  CALCIUM 9.0   ------------------------------------------------------------------------------------------------------------------  Cardiac Enzymes  Recent Labs Lab 07/11/16 1221 07/11/16 1533  TROPONINI 0.10* 0.09*   ------------------------------------------------------------------------------------------------------------------  RADIOLOGY:  Dg Colon W/cm Ltd  Result Date: 07/14/2016 CLINICAL DATA:  Partial sigmoid colon resection. Revaluation of the sigmoid colon anastomosis. EXAM: BE LIMITED WITH CONTRAST FLUOROSCOPY TIME:  Fluoroscopy Time:  1 minutes Radiation Exposure Index (if provided by the fluoroscopic device): 69.4 mGy Number of Acquired Spot Images: 7 COMPARISON:  CT abdomen 06/16/2016 FINDINGS: Single contrast  barium enema was  performed. An enema catheter was inserted and retention balloon distended under fluoroscopy. Barium was introduced with satisfactory demonstration of the rectum, sigmoid colon and descending colon. Sigmoid colon anastomotic site is identified in the left lower quadrant just inferior to the left sacroiliac joint. There is no active extravasation of contrast to suggest a leak. There is a small area of contrast protruding from the lumen which correlates with the anastomotic suture line when compared with the CT abdomen 06/16/2016 and the scout images. Examination was negative for an annular constricting mass lesion. There was no evidence of colonic stricture or obstruction. IMPRESSION: Sigmoid colon anastomotic site is identified in the left lower quadrant just inferior to the left sacroiliac joint. There is no active extravasation of contrast to suggest a leak. There is a small area of contrast protruding from the lumen which correlates with the anastomotic suture line when compared with the CT abdomen 06/16/2016 and the scout images most consistent with postsurgical changes. Electronically Signed   By: Kathreen Devoid   On: 07/14/2016 10:34    ASSESSMENT AND PLAN:   Principal Problem:   Acute on chronic renal failure (HCC) Active Problems:   Dehydration   * Acute Kidney injury on CKD stage III   Due to Dehydration   Renal ultrasound showed no obstruction   IV fluids   Avoid diuretics and nephrotoxins.  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  * DM   On lantus and SSI  * Anemia of chronic disease Monitor. Transfuse if less than 7.  All the records are reviewed and case discussed with Care Management/Social Workerr. Management plans discussed with the patient, family and they are in agreement.  CODE STATUS: Full  TOTAL TIME TAKING CARE OF THIS PATIENT: 35  minutes.   POSSIBLE D/C IN 1-2 DAYS, DEPENDING ON CLINICAL CONDITION.  Hillary Bow R M.D on 07/15/2016   Between 7am to 6pm - Pager - [phone]  After 6pm go to [email or web address] - password [email or web address] [facility]  Office  [phone]  CC: Primary care physician; Glendon Axe, MD  Note: This dictation was prepared with Dragon dictation along with smaller phrase technology. Any transcriptional errors that result from this process are unintentional.

## 2016-07-15 NOTE — Anesthesia Procedure Notes (Signed)
Procedure Name: Intubation Date/Time: 07/15/2016 9:51 AM Performed by: Jonna Clark Pre-anesthesia Checklist: Patient identified, Patient being monitored, Timeout performed, Emergency Drugs available and Suction available Patient Re-evaluated:Patient Re-evaluated prior to inductionOxygen Delivery Method: Circle system utilized Preoxygenation: Pre-oxygenation with 100% oxygen Intubation Type: IV induction Ventilation: Mask ventilation without difficulty Laryngoscope Size: Mac and 3 Grade View: Grade I Tube type: Oral Tube size: 7.5 mm Number of attempts: 1 Airway Equipment and Method: Stylet Placement Confirmation: ETT inserted through vocal cords under direct vision,  positive ETCO2 and breath sounds checked- equal and bilateral Secured at: 21 cm Tube secured with: Tape Dental Injury: Teeth and Oropharynx as per pre-operative assessment

## 2016-07-15 NOTE — Anesthesia Preprocedure Evaluation (Addendum)
Anesthesia Evaluation  Patient identified by MRN, date of birth, ID band Patient awake    Reviewed: Allergy & Precautions, H&P , NPO status , Patient's Chart, lab work & pertinent test results, reviewed documented beta blocker date and time   History of Anesthesia Complications Negative for: history of anesthetic complications  Airway Mallampati: III  TM Distance: >3 FB Neck ROM: full    Dental  (+) Upper Dentures, Lower Dentures, Edentulous Upper, Edentulous Lower   Pulmonary neg pulmonary ROS, neg COPD, former smoker,    Pulmonary exam normal breath sounds clear to auscultation- rhonchi (-) wheezing      Cardiovascular hypertension, On Medications + Peripheral Vascular Disease and +CHF  negative cardio ROS Normal cardiovascular exam Rhythm:Regular Rate:Normal + Systolic murmurs (SEM grade 2)    Neuro/Psych Hx of carpal tunnel syndrome negative neurological ROS  negative psych ROS   GI/Hepatic Neg liver ROS, Hx of diverticulitis   Endo/Other  negative endocrine ROSdiabetes, Type 2, Insulin DependentHypothyroidism   Renal/GU Renal InsufficiencyRenal diseasenegative Renal ROS  negative genitourinary   Musculoskeletal  (+) Arthritis ,   Abdominal (+) + obese,   Peds negative pediatric ROS (+)  Hematology negative hematology ROS (+)   Anesthesia Other Findings Past Medical History: No date: Arthritis     Comment: Inflammatory No date: CHF (congestive heart failure) ([place]) February 07, 2015: Chronic renal insufficiency No date: Coronary artery dilation (HCC) No date: Diabetes mellitus without complication (HCC)     Comment: Type II No date: Hyperlipidemia No date: Hypertension No date: Renal insufficiency No date: Sinus problem No date: Thyroid disease February 07, 2015: Ulcer of left lower leg [facility]) Past Surgical History: No date: ABDOMINAL HYSTERECTOMY 03/11/2015: CATARACT EXTRACTION W/PHACO Right     Comment:  Procedure: CATARACT EXTRACTION PHACO AND               INTRAOCULAR LENS PLACEMENT (IOC);  Surgeon:               Birder Robson, MD;  Location: [facility] ORS;                Service: Ophthalmology;  Laterality: Right;  [place]              00:40 AP% 24.3 CDE 9.86 No date: CYST EXCISION 1984: DILATION AND CURETTAGE OF UTERUS 09/04/2015: I&D EXTREMITY Right     Comment: Procedure: IRRIGATION AND DEBRIDEMENT               EXTREMITY/ AND BIOPSY;  Surgeon: Algernon Huxley,               MD;  Location: [facility] ORS;  Service: Vascular;                Laterality: Right; 1985: PARTIAL HYSTERECTOMY 2004: ROTATOR CUFF REPAIR BMI    Body Mass Index:  36.33 kg/m     Reproductive/Obstetrics negative OB ROS                             Anesthesia Physical  Anesthesia Plan  ASA: III and emergent  Anesthesia Plan: General ETT   Post-op Pain Management:    Induction: Rapid sequence, Intravenous and Cricoid pressure planned  Airway Management Planned: Oral ETT  Additional Equipment:   Intra-op Plan:   Post-operative Plan: Extubation in OR  Informed Consent: I have reviewed the patients History and Physical, chart, labs and discussed the procedure including the risks, benefits and alternatives for the  proposed anesthesia with the patient or authorized representative who has indicated his/her understanding and acceptance.   Dental Advisory Given  Plan Discussed with: CRNA, Anesthesiologist and Surgeon  Anesthesia Plan Comments: (With Hx of demand ischemia previously, we will transfuse 1 unit of pRBCs during the first portion of the case. )       Anesthesia Quick Evaluation

## 2016-07-15 NOTE — Progress Notes (Signed)
PT Cancellation Note  Patient Details Name: [name] MRN: [id number] DOB: 03/14/1946   Cancelled Treatment:    Reason Eval/Treat Not Completed: Other (comment). Pt leaving for Sx for ileostomy reversal this date. Discussed with RN. Will complete current order at this time. Please re-order post-op if therapy needed. Thank you.   [name] 07/15/2016, 8:23 AM Greggory Stallion, PT, DPT [phone]

## 2016-07-15 NOTE — Transfer of Care (Signed)
Immediate Anesthesia Transfer of Care Note  Patient: [name]  Procedure(s) Performed: Procedure(s): ILEOSTOMY TAKEDOWN (N/A)  Patient Location: PACU  Anesthesia Type:General  Level of Consciousness: patient cooperative and lethargic  Airway & Oxygen Therapy: Patient Spontanous Breathing and Patient connected to face mask oxygen  Post-op Assessment: Report given to RN and Post -op Vital signs reviewed and stable  Post vital signs: Reviewed and stable  Last Vitals:  Vitals:   07/15/16 0917 07/15/16 1220  BP: 133/65 (!) 183/81  Pulse: 68 70  Resp: 18 15  Temp: 36.8 C 36.5 C    Last Pain:  Vitals:   07/15/16 0917  TempSrc: Tympanic  PainSc:       Patients Stated Pain Goal: 0 ([id number])  Complications: No apparent anesthesia complications

## 2016-07-15 NOTE — Op Note (Signed)
07/11/2016 - 07/15/2016  12:08 PM  PATIENT:  [name]  70 y.o. female  PRE-OPERATIVE DIAGNOSIS:  s/p perforated sigmoid diverticulitis  POST-OPERATIVE DIAGNOSIS:  s/p perforated sigmoid diverticulitis  PROCEDURE:  Procedure(s): ILEOSTOMY TAKEDOWN (N/A)  SURGEON:  Surgeon(s) and Role:    * Dia Crawford III, MD - Primary    * Nestor Lewandowsky, MD - Assisting   ASSISTANTS: See above   ANESTHESIA:   general  EBL:  Total I/O In: 1400 [I.V.:1100; Blood:300] Out: 950 [Urine:700; Blood:250]   DRAINS: (1) Jackson-Pratt drain(s) with closed bulb suction in the Subcutaneous space   LOCAL MEDICATIONS USED:  NONE   DISPOSITION OF SPECIMEN:  PATHOLOGY   DICTATION: .Dragon Dictation  With the patient in the supine position and after induction appropriate general anesthesia the patient's abdomen was prepped with Betadine and draped sterile towels. Prior to draping the ostomy was oversewn with 3-0 nylon to close the lumens. This incision was made in elliptical fashion around the ostomy and carried down to the septae tissues over cautery. The subcutaneous tissue was quite pants with a significant fibroplastic and desmoplastic reaction. Dissection was quite difficult and with the degree of subcutaneous fat in this patient the exposure was very tedious. The fascia was eventually identified the ostomy ellipsed. The was very difficult to mobilize the ostomy into the incision so both loops were divided with the GIA stapling device. The ostomy and the resulting fibrous reaction was passed off the table and sent to pathology.  2 loops of bowel were placed side-by-side. Small enterotomy made in each loop of bowel and a limb of the Endo GIA stapling device was inserted into each loop of bowel. Stapler was approximated fired. The anastomosis appeared to be satisfactory. The common enterotomy was closed with single application of a TX 60 stapling device carrying a blue load. The staple line was oversewn with  3-0 silk. Bowels return to its anatomic position. The fascia was quite thickened and fibrotic. It was closed in a single layer using figure-of-eight sutures of #1 Prolene. Subcutaneous drain was placed using a 19 [place] Blake drain. Skin was clipped with some stay sutures placed just through the subcutaneous space and skin to take some tension off the closure. Sterile dressings were applied. Patient returned recovery room in satisfactory condition. Sponge instrument needle count correct 2 in the operating room.  PLAN OF CARE: Admit to inpatient   PATIENT DISPOSITION:  PACU - hemodynamically stable.   Dia Crawford III, MD

## 2016-07-16 ENCOUNTER — Encounter: Payer: Self-pay | Admitting: Surgery

## 2016-07-16 LAB — CBC
HCT: 30.5 % — ABNORMAL LOW (ref 35.0–47.0)
HEMOGLOBIN: 10.5 g/dL — AB (ref 12.0–16.0)
MCH: 29.9 pg (ref 26.0–34.0)
MCHC: 34.4 g/dL (ref 32.0–36.0)
MCV: 86.9 fL (ref 80.0–100.0)
PLATELETS: 203 10*3/uL (ref 150–440)
RBC: 3.51 MIL/uL — AB (ref 3.80–5.20)
RDW: 15.1 % — ABNORMAL HIGH (ref 11.5–14.5)
WBC: 19.1 10*3/uL — AB (ref 3.6–11.0)

## 2016-07-16 LAB — BASIC METABOLIC PANEL
ANION GAP: 4 — AB (ref 5–15)
BUN: 40 mg/dL — ABNORMAL HIGH (ref 6–20)
CHLORIDE: 112 mmol/L — AB (ref 101–111)
CO2: 21 mmol/L — ABNORMAL LOW (ref 22–32)
Calcium: 8.7 mg/dL — ABNORMAL LOW (ref 8.9–10.3)
Creatinine, Ser: 2.16 mg/dL — ABNORMAL HIGH (ref 0.44–1.00)
GFR calc Af Amer: 25 mL/min — ABNORMAL LOW (ref >60)
GFR, EST NON AFRICAN AMERICAN: 22 mL/min — AB (ref >60)
Glucose, Bld: 303 mg/dL — ABNORMAL HIGH (ref 65–99)
POTASSIUM: 5.2 mmol/L — AB (ref 3.5–5.1)
SODIUM: 137 mmol/L (ref 135–145)

## 2016-07-16 LAB — TYPE AND SCREEN
ABO/RH(D): A POS
ANTIBODY SCREEN: NEGATIVE
UNIT DIVISION: 0
UNIT DIVISION: 0

## 2016-07-16 LAB — SURGICAL PATHOLOGY

## 2016-07-16 LAB — GLUCOSE, CAPILLARY
GLUCOSE-CAPILLARY: 296 mg/dL — AB (ref 65–99)
GLUCOSE-CAPILLARY: 160 mg/dL — AB (ref 65–99)
GLUCOSE-CAPILLARY: 162 mg/dL — AB (ref 65–99)
GLUCOSE-CAPILLARY: 110 mg/dL — AB (ref 65–99)

## 2016-07-16 MED ORDER — INSULIN GLARGINE 100 UNIT/ML ~~LOC~~ SOLN
18.0000 [IU] | Freq: Every day | SUBCUTANEOUS | Status: DC
  Administered 2016-07-17 – 2016-07-20 (×3): 18 [IU] via SUBCUTANEOUS
  Filled 2016-07-16 (×6): qty 0.18

## 2016-07-16 MED ORDER — INSULIN ASPART 100 UNIT/ML ~~LOC~~ SOLN
6.0000 [IU] | Freq: Three times a day (TID) | SUBCUTANEOUS | Status: DC
  Administered 2016-07-16 – 2016-07-21 (×13): 6 [IU] via SUBCUTANEOUS
  Filled 2016-07-16 (×13): qty 6

## 2016-07-16 MED ORDER — HYDRALAZINE HCL 50 MG PO TABS
50.0000 mg | ORAL_TABLET | Freq: Three times a day (TID) | ORAL | Status: DC
  Administered 2016-07-16 – 2016-07-20 (×10): 50 mg via ORAL
  Filled 2016-07-16 (×11): qty 1

## 2016-07-16 NOTE — Care Management (Signed)
ILEOSTOMY TAKEDOWN  9/21.  PT consult pending

## 2016-07-16 NOTE — Clinical Social Work Note (Signed)
Clinical Social Work Assessment  Patient Details  Name: [name] MRN: [id number] Date of Birth: [birth year]  Date of referral:  07/16/16               Reason for consult:  Facility Placement                Permission sought to share information with:    Permission granted to share information::     Name::        Agency::     Relationship::     Contact Information:     Housing/Transportation Living arrangements for the past 2 months:  [place], [place] of Information:  Patient Patient Interpreter Needed:  None Criminal Activity/Legal Involvement Pertinent to Current Situation/Hospitalization:  No - Comment as needed Significant Relationships:  Adult Children Lives with:  Self Do you feel safe going back to the place where you live?  Yes Need for family participation in patient care:  Yes (Comment)  Care giving concerns:  Patient recently was at [facility] for rehab and was discharged to home.    Social Worker assessment / plan:  CSW awaiting PT evaluation at this time. CSW met with patient who is known to CSW from previous admission in which she was placed at [facility] for rehab a new ostomy care. Patient 's ostomy has been reversed and patient is happy about this. Patient explained that her daughters are trying to work out a regimen where they can stay at home and assist her. Patient is unsure if she would want to return for rehab and wishes to think about this. CSW has prepared FL2 in the event a bedsearch needs to be initiated.  Employment status:  Retired Nurse, adult PT Recommendations:  Not assessed at this time Information / Referral to community resources:     Patient/Family's Response to care:  Patient expressed appreciation for CSW assistance.  Patient/Family's Understanding of and Emotional Response to Diagnosis, Current Treatment, and Prognosis:  Patient is aware she has limitations and is  hopeful her daughter can assist with her care at home.   Emotional Assessment Appearance:  Appears stated age Attitude/Demeanor/Rapport:   (pleasant and cooperative) Affect (typically observed):  Calm, Pleasant Orientation:  Oriented to Self, Oriented to Place, Oriented to  Time, Oriented to Situation Alcohol / Substance use:  Not Applicable Psych involvement (Current and /or in the community):  No (Comment)  Discharge Needs  Concerns to be addressed:  Care Coordination Readmission within the last 30 days:  No Current discharge risk:  None Barriers to Discharge:  No Barriers Identified   [name] Leff, LCSW 07/16/2016, 3:37 PM

## 2016-07-16 NOTE — Anesthesia Postprocedure Evaluation (Signed)
Anesthesia Post Note  Patient: [name]  Procedure(s) Performed: Procedure(s) (LRB): ILEOSTOMY TAKEDOWN (N/A)  Patient location during evaluation: PACU Anesthesia Type: General Level of consciousness: awake and alert and oriented Pain management: pain level controlled Vital Signs Assessment: post-procedure vital signs reviewed and stable Respiratory status: spontaneous breathing Cardiovascular status: blood pressure returned to baseline Anesthetic complications: no    Last Vitals:  Vitals:   07/16/16 1140 07/16/16 1400  BP: 137/63 (!) 120/47  Pulse: 68 72  Resp: 17   Temp: 36.8 C     Last Pain:  Vitals:   07/16/16 1140  TempSrc: Oral  PainSc:                  Cosme Jacob

## 2016-07-16 NOTE — Progress Notes (Signed)
Inpatient Diabetes Program Recommendations  AACE/ADA: New Consensus Statement on Inpatient Glycemic Control (2015)  Target Ranges:  Prepandial:   less than 140 mg/dL      Peak postprandial:   less than 180 mg/dL (1-2 hours)      Critically ill patients:  140 - 180 mg/dL   Lab Results  Component Value Date   GLUCAP 296 (H) 07/16/2016   HGBA1C 7.9 (H) 06/16/2016    Review of Glycemic Control:  Results for LIZBHET, STALBAUM (MRN [id number]) as of 07/16/2016 10:35  Ref. Range 07/15/2016 07:38 07/15/2016 12:23 07/15/2016 16:34 07/15/2016 21:39 07/16/2016 07:46  Glucose-Capillary Latest Ref Range: 65 - 99 mg/dL 139 (H) 130 (H) 221 (H) 276 (H) 296 (H)    Diabetes history: Type 2 diabetes Outpatient Diabetes medications: Lantus 25 units q HS, Novolog 12 units tid with meals, Actos 15 mg daily Current orders for Inpatient glycemic control:  Novolog resistant tid with meals and HS, Novolog 6 units tid with meals, Lantus 18 units q HS  Inpatient Diabetes Program Recommendations:    Note that patient did receive Decadron 10 mg yesterday in the AM.  This likely has increased CBG's.  Agree with changes in insulin today.  Patient to start diet today.  Will follow.  Thanks,   Adah Perl, RN, BC-ADM Inpatient Diabetes Coordinator Pager [phone] (8a-5p)

## 2016-07-16 NOTE — Progress Notes (Signed)
[facility] [facility] [place] NAME: [name]    MR#:  [id number]  DATE OF BIRTH:  03-May-1946  SUBJECTIVE:  CHIEF COMPLAINT:   Chief Complaint  Patient presents with  . Weakness   Nausea and vomiting have resolved. Some abdominal pain at surgical site Patient had ileostomy reversal 07/15/2016.  REVIEW OF SYSTEMS:  CONSTITUTIONAL: No fever, fatigue or weakness.  EYES: No blurred or double vision.  EARS, NOSE, AND THROAT: No tinnitus or ear pain.  RESPIRATORY: No cough, shortness of breath, wheezing or hemoptysis.  CARDIOVASCULAR: No chest pain, orthopnea, edema.  GASTROINTESTINAL: No nausea, vomiting, diarrhea or abdominal pain.  GENITOURINARY: No dysuria, hematuria.  ENDOCRINE: No polyuria, nocturia,  HEMATOLOGY: No anemia, easy bruising or bleeding SKIN: No rash or lesion. MUSCULOSKELETAL: No joint pain or arthritis.   NEUROLOGIC: No tingling, numbness, weakness.  PSYCHIATRY: No anxiety or depression.   ROS  DRUG ALLERGIES:   Allergies  Allergen Reactions  . Doxycycline Other (See Comments)    Reaction: unknown  . Levaquin [Levofloxacin] Other (See Comments)    Causes joint pain  . Penicillins Rash and Other (See Comments)    Has patient had a PCN reaction causing immediate rash, facial/tongue/throat swelling, SOB or lightheadedness with hypotension: Yes Has patient had a PCN reaction causing severe rash involving mucus membranes or skin necrosis: No Has patient had a PCN reaction that required hospitalization No Has patient had a PCN reaction occurring within the last 10 years: No If all of the above answers are "NO", then may proceed with Cephalosporin use.  . Sulfa Antibiotics Swelling and Rash    VITALS:  Blood pressure (!) 144/57, pulse 62, temperature 98.5 F (36.9 C), temperature source Oral, resp. rate 14, height 5\' 6"  (1.676 m), weight 98 kg (216 lb), SpO2 100 %.  PHYSICAL EXAMINATION:   GENERAL:  70 y.o.-year-old  patient lying in the bed with no acute distress.  EYES: Pupils equal, round, reactive to light and accommodation. No scleral icterus. Extraocular muscles intact.  HEENT: Head atraumatic, normocephalic. Oropharynx and nasopharynx clear. Mucosa dry. NECK:  Supple, no jugular venous distention. No thyroid enlargement, no tenderness.  LUNGS: Normal breath sounds bilaterally, no wheezing, rales,rhonchi or crepitation. No use of accessory muscles of respiration.  CARDIOVASCULAR: S1, S2 normal. No murmurs, rubs, or gallops.  ABDOMEN: Dressing over surgical site. EXTREMITIES: No pedal edema, cyanosis, or clubbing.  NEUROLOGIC: Cranial nerves II through XII are intact. Muscle strength 5/5 in all extremities. Sensation intact. Gait not checked.  PSYCHIATRIC: The patient is alert and oriented x 3.  SKIN: No obvious rash, lesion, or ulcer.   Physical Exam LABORATORY PANEL:   CBC  Recent Labs Lab 07/16/16 0441  WBC 19.1*  HGB 10.5*  HCT 30.5*  PLT 203   ------------------------------------------------------------------------------------------------------------------  Chemistries   Recent Labs Lab 07/16/16 0441  NA 137  K 5.2*  CL 112*  CO2 21*  GLUCOSE 303*  BUN 40*  CREATININE 2.16*  CALCIUM 8.7*   ------------------------------------------------------------------------------------------------------------------  Cardiac Enzymes  Recent Labs Lab 07/11/16 1221 07/11/16 1533  TROPONINI 0.10* 0.09*   ------------------------------------------------------------------------------------------------------------------  RADIOLOGY:  No results found.  ASSESSMENT AND PLAN:   Principal Problem:   Acute on chronic renal failure (HCC) Active Problems:   Dehydration   * Acute Kidney injury on CKD stage III - resolved   Due to Dehydration   Renal ultrasound showed no obstruction   IV fluids   Avoid diuretics and nephrotoxins.  * History  of diverticulitis status post ileostomy   Ileostomy reversal surgery 07/15/2016   * Hyperkalemia Improved Stop lisinopril  * Hypertension    Amlodipin, metorpolol. Stop lisinopril and add hydralazine  * DM   On lantus and SSI. Increase dose of Lantus. Added pre-meal NovoLog.  * Anemia of chronic disease Monitor. Transfuse if less than 7.  All the records are reviewed and case discussed with Care Management/Social Workerr. Management plans discussed with the patient, family and they are in agreement.  CODE STATUS: Full  TOTAL TIME TAKING CARE OF THIS PATIENT: 35 minutes.    Hillary Bow R M.D on 07/16/2016   Between 7am to 6pm - Pager - [phone]  After 6pm go to [email or web address] - password [email or web address] [facility]  Office  [phone]  CC: Primary care physician; Glendon Axe, MD  Note: This dictation was prepared with Dragon dictation along with smaller phrase technology. Any transcriptional errors that result from this process are unintentional.

## 2016-07-16 NOTE — Progress Notes (Signed)
Subjective:   She feels well today post surgery. She's not having any significant abdominal pain. She's not nauseated and had a small bowel movement this morning. She has been passing gas. She has only moderate drainage from the JP drain. Her white count has bumped up to 19,000. Her hemoglobin is stable at 10.5 after 2 units transfusion.  Vital signs in last 24 hours: Temp:  [97.7 F (36.5 C)-98.3 F (36.8 C)] 98.3 F (36.8 C) (09/22 0409) Pulse Rate:  [63-73] 68 (09/22 0409) Resp:  [7-21] 16 (09/22 0409) BP: (127-183)/(53-109) 162/66 (09/22 0409) SpO2:  [94 %-100 %] 100 % (09/22 0409) Last BM Date: 07/14/16  Intake/Output from previous day: 09/21 0701 - 09/22 0700 In: 4551.2 [P.O.:540; I.V.:2602; Blood:630; IV Piggyback:779.2] Out: 1925 [Urine:1555; Drains:120; Blood:250]  Exam:  Her abdomen is soft with good bowel sounds and moderate incisional tenderness.  Lab Results:  CBC  Recent Labs  07/15/16 0438 07/15/16 1606 07/16/16 0441  WBC 8.0  --  19.1*  HGB 7.2* 11.6* 10.5*  HCT 21.4* 33.7* 30.5*  PLT 197  --  203   CMP     Component Value Date/Time   NA 137 07/16/2016 0441   NA 145 05/22/2014 0712   K 5.2 (H) 07/16/2016 0441   K 3.9 05/22/2014 0712   CL 112 (H) 07/16/2016 0441   CL 110 (H) 05/22/2014 0712   CO2 21 (L) 07/16/2016 0441   CO2 29 05/22/2014 0712   GLUCOSE 303 (H) 07/16/2016 0441   GLUCOSE 61 (L) 05/22/2014 0712   BUN 40 (H) 07/16/2016 0441   BUN 28 (H) 05/22/2014 0712   CREATININE 2.16 (H) 07/16/2016 0441   CREATININE 1.75 (H) 05/22/2014 0712   CALCIUM 8.7 (L) 07/16/2016 0441   CALCIUM 7.9 (L) 05/22/2014 0712   PROT 7.1 06/21/2016 0458   PROT 6.6 05/20/2014 1246   ALBUMIN 2.6 (L) 06/21/2016 0458   ALBUMIN 3.0 (L) 05/20/2014 1246   AST 22 06/21/2016 0458   AST 14 (L) 05/20/2014 1246   ALT 16 06/21/2016 0458   ALT 27 05/20/2014 1246   ALKPHOS 68 06/21/2016 0458   ALKPHOS 54 05/20/2014 1246   BILITOT 0.4 06/21/2016 0458   BILITOT 0.6  05/20/2014 1246   GFRNONAA 22 (L) 07/16/2016 0441   GFRNONAA 29 (L) 05/22/2014 0712   GFRAA 25 (L) 07/16/2016 0441   GFRAA 34 (L) 05/22/2014 0712   PT/INR No results for input(s): LABPROT, INR in the last 72 hours.  Studies/Results: Dg Colon W/cm Ltd  Result Date: 07/14/2016 CLINICAL DATA:  Partial sigmoid colon resection. Revaluation of the sigmoid colon anastomosis. EXAM: BE LIMITED WITH CONTRAST FLUOROSCOPY TIME:  Fluoroscopy Time:  1 minutes Radiation Exposure Index (if provided by the fluoroscopic device): 69.4 mGy Number of Acquired Spot Images: 7 COMPARISON:  CT abdomen 06/16/2016 FINDINGS: Single contrast barium enema was performed. An enema catheter was inserted and retention balloon distended under fluoroscopy. Barium was introduced with satisfactory demonstration of the rectum, sigmoid colon and descending colon. Sigmoid colon anastomotic site is identified in the left lower quadrant just inferior to the left sacroiliac joint. There is no active extravasation of contrast to suggest a leak. There is a small area of contrast protruding from the lumen which correlates with the anastomotic suture line when compared with the CT abdomen 06/16/2016 and the scout images. Examination was negative for an annular constricting mass lesion. There was no evidence of colonic stricture or obstruction. IMPRESSION: Sigmoid colon anastomotic site is identified in the left  lower quadrant just inferior to the left sacroiliac joint. There is no active extravasation of contrast to suggest a leak. There is a small area of contrast protruding from the lumen which correlates with the anastomotic suture line when compared with the CT abdomen 06/16/2016 and the scout images most consistent with postsurgical changes. Electronically Signed   By: Kathreen Devoid   On: 07/14/2016 10:34    Assessment/Plan: She seems to be doing relatively well postsurgery. I am a bit concerned about the change in her white blood cell count  but the only bowel function is encouraging. We will increase her activity and start her on a regular diet later today anticipated.

## 2016-07-17 LAB — CBC
HCT: 26.8 % — ABNORMAL LOW (ref 35.0–47.0)
HEMOGLOBIN: 9.1 g/dL — AB (ref 12.0–16.0)
MCH: 30 pg (ref 26.0–34.0)
MCHC: 34 g/dL (ref 32.0–36.0)
MCV: 88.1 fL (ref 80.0–100.0)
PLATELETS: 181 10*3/uL (ref 150–440)
RBC: 3.05 MIL/uL — AB (ref 3.80–5.20)
RDW: 15.4 % — ABNORMAL HIGH (ref 11.5–14.5)
WBC: 14.2 10*3/uL — AB (ref 3.6–11.0)

## 2016-07-17 LAB — GLUCOSE, CAPILLARY
GLUCOSE-CAPILLARY: 136 mg/dL — AB (ref 65–99)
GLUCOSE-CAPILLARY: 173 mg/dL — AB (ref 65–99)
Glucose-Capillary: 146 mg/dL — ABNORMAL HIGH (ref 65–99)
Glucose-Capillary: 150 mg/dL — ABNORMAL HIGH (ref 65–99)

## 2016-07-17 NOTE — Progress Notes (Signed)
2 Days Post-Op   Subjective:  70 year old female 2 days status post ileostomy reversal. Doing well. Tolerating a liquid diet asking if she can have solid food. She has had a bowel movement and is passing flatus.  Vital signs in last 24 hours: Temp:  [97.7 F (36.5 C)-98.6 F (37 C)] 98.6 F (37 C) (09/23 0556) Pulse Rate:  [64-78] 78 (09/23 0556) Resp:  [17-18] 18 (09/23 0556) BP: (119-137)/(47-63) 119/48 (09/23 0556) SpO2:  [98 %-100 %] 99 % (09/23 0556) Last BM Date: 07/16/16  Intake/Output from previous day: 09/22 0701 - 09/23 0700 In: 3691 [P.O.:1920; I.V.:1771] Out: 1750 [Urine:1700; Drains:50]  GI: Abdomen is soft, appropriate tender to palpation at incision site, nondistended. Tender strain in place draining a serosanguineous fluid. Previous ostomy site appears to be well approximated. No evidence of purulent drainage or spreading erythema.  Lab Results:  CBC  Recent Labs  07/16/16 0441 07/17/16 0552  WBC 19.1* 14.2*  HGB 10.5* 9.1*  HCT 30.5* 26.8*  PLT 203 181   CMP     Component Value Date/Time   NA 137 07/16/2016 0441   NA 145 05/22/2014 0712   K 5.2 (H) 07/16/2016 0441   K 3.9 05/22/2014 0712   CL 112 (H) 07/16/2016 0441   CL 110 (H) 05/22/2014 0712   CO2 21 (L) 07/16/2016 0441   CO2 29 05/22/2014 0712   GLUCOSE 303 (H) 07/16/2016 0441   GLUCOSE 61 (L) 05/22/2014 0712   BUN 40 (H) 07/16/2016 0441   BUN 28 (H) 05/22/2014 0712   CREATININE 2.16 (H) 07/16/2016 0441   CREATININE 1.75 (H) 05/22/2014 0712   CALCIUM 8.7 (L) 07/16/2016 0441   CALCIUM 7.9 (L) 05/22/2014 0712   PROT 7.1 06/21/2016 0458   PROT 6.6 05/20/2014 1246   ALBUMIN 2.6 (L) 06/21/2016 0458   ALBUMIN 3.0 (L) 05/20/2014 1246   AST 22 06/21/2016 0458   AST 14 (L) 05/20/2014 1246   ALT 16 06/21/2016 0458   ALT 27 05/20/2014 1246   ALKPHOS 68 06/21/2016 0458   ALKPHOS 54 05/20/2014 1246   BILITOT 0.4 06/21/2016 0458   BILITOT 0.6 05/20/2014 1246   GFRNONAA 22 (L) 07/16/2016 0441   GFRNONAA 29 (L) 05/22/2014 0712   GFRAA 25 (L) 07/16/2016 0441   GFRAA 34 (L) 05/22/2014 0712   PT/INR No results for input(s): LABPROT, INR in the last 72 hours.  Studies/Results: No results found.  Assessment/Plan: 70 year old female status post ileostomy takedown. Doing well. Encourage ambulation and incentive spirometer usage. Plan to advance diet today. Remove Foley catheter today. Continue to monitor closely.   Clayburn Pert, MD [name] General Surgeon  07/17/2016

## 2016-07-17 NOTE — Progress Notes (Signed)
[place] [facility] NAME: [name]    MR#:  [id number]  DATE OF BIRTH:  January 16, 1946  SUBJECTIVE: She feels better. No abdominal pain.  Passing gas. Tolerated breakfast. Blood pressure is stable.   CHIEF COMPLAINT:   Chief Complaint  Patient presents with  . Weakness   Nausea and vomiting have resolved. Some abdominal pain at surgical site Patient had ileostomy reversal 07/15/2016.  REVIEW OF SYSTEMS:  CONSTITUTIONAL: No fever, fatigue or weakness.  EYES: No blurred or double vision.  EARS, NOSE, AND THROAT: No tinnitus or ear pain.  RESPIRATORY: No cough, shortness of breath, wheezing or hemoptysis.  CARDIOVASCULAR: No chest pain, orthopnea, edema.  GASTROINTESTINAL: No nausea, vomiting, diarrhea or abdominal pain.  GENITOURINARY: No dysuria, hematuria.  ENDOCRINE: No polyuria, nocturia,  HEMATOLOGY: No anemia, easy bruising or bleeding SKIN: No rash or lesion. MUSCULOSKELETAL: No joint pain or arthritis.   NEUROLOGIC: No tingling, numbness, weakness.  PSYCHIATRY: No anxiety or depression.   ROS  DRUG ALLERGIES:   Allergies  Allergen Reactions  . Doxycycline Other (See Comments)    Reaction: unknown  . Levaquin [Levofloxacin] Other (See Comments)    Causes joint pain  . Penicillins Rash and Other (See Comments)    Has patient had a PCN reaction causing immediate rash, facial/tongue/throat swelling, SOB or lightheadedness with hypotension: Yes Has patient had a PCN reaction causing severe rash involving mucus membranes or skin necrosis: No Has patient had a PCN reaction that required hospitalization No Has patient had a PCN reaction occurring within the last 10 years: No If all of the above answers are "NO", then may proceed with Cephalosporin use.  . Sulfa Antibiotics Swelling and Rash    VITALS:  Blood pressure (!) 119/48, pulse 78, temperature 98.6 F (37 C), temperature source Oral, resp. rate 18, height 5\' 6"   (1.676 m), weight 98 kg (216 lb), SpO2 99 %.  PHYSICAL EXAMINATION:   GENERAL:  70 y.o.-year-old patient lying in the bed with no acute distress.  EYES: Pupils equal, round, reactive to light and accommodation. No scleral icterus. Extraocular muscles intact.  HEENT: Head atraumatic, normocephalic. Oropharynx and nasopharynx clear. Mucosa dry. NECK:  Supple, no jugular venous distention. No thyroid enlargement, no tenderness.  LUNGS: Normal breath sounds bilaterally, no wheezing, rales,rhonchi or crepitation. No use of accessory muscles of respiration.  CARDIOVASCULAR: S1, S2 normal. No murmurs, rubs, or gallops.  ABDOMEN: Dressing over surgical site. EXTREMITIES: No pedal edema, cyanosis, or clubbing.  NEUROLOGIC: Cranial nerves II through XII are intact. Muscle strength 5/5 in all extremities. Sensation intact. Gait not checked.  PSYCHIATRIC: The patient is alert and oriented x 3.  SKIN: No obvious rash, lesion, or ulcer.   Physical Exam LABORATORY PANEL:   CBC  Recent Labs Lab 07/17/16 0552  WBC 14.2*  HGB 9.1*  HCT 26.8*  PLT 181   ------------------------------------------------------------------------------------------------------------------  Chemistries   Recent Labs Lab 07/16/16 0441  NA 137  K 5.2*  CL 112*  CO2 21*  GLUCOSE 303*  BUN 40*  CREATININE 2.16*  CALCIUM 8.7*   ------------------------------------------------------------------------------------------------------------------  Cardiac Enzymes  Recent Labs Lab 07/11/16 1221 07/11/16 1533  TROPONINI 0.10* 0.09*   ------------------------------------------------------------------------------------------------------------------  RADIOLOGY:  No results found.  ASSESSMENT AND PLAN:   Principal Problem:   Acute on chronic renal failure (HCC) Active Problems:   Dehydration   * Acute Kidney injury on CKD stage III - resolved,Monitor kidney function today.   Due to Dehydration  Renal  ultrasound showed no obstruction Discontinue IV fluids of the kidney function improves, encourage by mouth intake.   Avoid diuretics and nephrotoxins.  * History of diverticulitis status post ileostomy  Ileostomy reversal surgery 07/15/2016   * Hyperkalemia Improved Stop lisinopril  * Hypertension;Controlled.    Amlodipin, metorpolol., Hydralazine. Hold  lisinopril.  * DM   On lantus and SSI. Increased dose of Lantus. Added pre-meal NovoLog.  * Anemia of chronic disease Monitor. Transfuse if less than 7.  All the records are reviewed and case discussed with Care Management/Social Workerr. Management plans discussed with the patient, family and they are in agreement.  CODE STATUS: Full  TOTAL TIME TAKING CARE OF THIS PATIENT: 20 minutes.    Epifanio Lesches M.D on 07/17/2016   Between 7am to 6pm - Pager - [phone]  After 6pm go to [email or web address] - password [email or web address] [facility]  Office  [phone]  CC: Primary care physician; Glendon Axe, MD  Note: This dictation was prepared with Dragon dictation along with smaller phrase technology. Any transcriptional errors that result from this process are unintentional.

## 2016-07-18 LAB — CBC WITH DIFFERENTIAL/PLATELET
Basophils Absolute: 0 10*3/uL (ref 0–0.1)
Basophils Relative: 0 %
EOS ABS: 0.2 10*3/uL (ref 0–0.7)
Eosinophils Relative: 2 %
HEMATOCRIT: 26.7 % — AB (ref 35.0–47.0)
HEMOGLOBIN: 9.2 g/dL — AB (ref 12.0–16.0)
LYMPHS ABS: 1.7 10*3/uL (ref 1.0–3.6)
Lymphocytes Relative: 15 %
MCH: 30.3 pg (ref 26.0–34.0)
MCHC: 34.3 g/dL (ref 32.0–36.0)
MCV: 88.4 fL (ref 80.0–100.0)
MONO ABS: 1.4 10*3/uL — AB (ref 0.2–0.9)
MONOS PCT: 12 %
NEUTROS PCT: 71 %
Neutro Abs: 8.4 10*3/uL — ABNORMAL HIGH (ref 1.4–6.5)
Platelets: 177 10*3/uL (ref 150–440)
RBC: 3.03 MIL/uL — ABNORMAL LOW (ref 3.80–5.20)
RDW: 15.8 % — AB (ref 11.5–14.5)
WBC: 11.8 10*3/uL — ABNORMAL HIGH (ref 3.6–11.0)

## 2016-07-18 LAB — GLUCOSE, CAPILLARY
GLUCOSE-CAPILLARY: 138 mg/dL — AB (ref 65–99)
Glucose-Capillary: 152 mg/dL — ABNORMAL HIGH (ref 65–99)
Glucose-Capillary: 84 mg/dL (ref 65–99)
Glucose-Capillary: 259 mg/dL — ABNORMAL HIGH (ref 65–99)

## 2016-07-18 LAB — BASIC METABOLIC PANEL
Anion gap: 5 (ref 5–15)
BUN: 29 mg/dL — AB (ref 6–20)
CHLORIDE: 104 mmol/L (ref 101–111)
CO2: 31 mmol/L (ref 22–32)
CREATININE: 1.83 mg/dL — AB (ref 0.44–1.00)
Calcium: 8 mg/dL — ABNORMAL LOW (ref 8.9–10.3)
GFR calc non Af Amer: 27 mL/min — ABNORMAL LOW (ref >60)
GFR, EST AFRICAN AMERICAN: 31 mL/min — AB (ref >60)
GLUCOSE: 146 mg/dL — AB (ref 65–99)
Potassium: 4 mmol/L (ref 3.5–5.1)
Sodium: 140 mmol/L (ref 135–145)

## 2016-07-18 NOTE — Progress Notes (Signed)
07/18/2016  Subjective: Patient is postop day 3 from her ileostomy takedown procedure. Her diet has been fully advanced now and she is tolerating her diet and having appropriate bowel function. Her labs have all improved from the acute dehydration and acute on chronic renal failure that she presented with on this admission. She has not been to ambulatory yet. Otherwise denies any fevers chills chest pain shortness of breath nausea or vomiting.  Vital signs: Temp:  [97.5 F (36.4 C)-98.1 F (36.7 C)] 98.1 F (36.7 C) (09/24 1100) Pulse Rate:  [73-77] 73 (09/24 1100) Resp:  [18-20] 18 (09/24 1100) BP: (118-141)/(54-62) 132/61 (09/24 1100) SpO2:  [99 %-100 %] 99 % (09/24 1100)   Intake/Output: 09/23 0701 - 09/24 0700 In: 2771.9 [P.O.:1230; I.V.:1541.9] Out: 1230 [Urine:800; Drains:430] Last BM Date: 07/16/16  Physical Exam: Constitutional: No acute distress Cardiac:  Regular rhythm and rate Pulm: Lungs are clear bilaterally with no respiratory distress Abdomen: Abdomen is soft nondistended and appropriately tender to palpation. The patient has a small wound on the upper portion of her previous midline incision from her last surgery with no purulent drainage and appropriate wound healing. Her ostomy takedown incision is clean dry and intact with 2 bolstering sutures and staples. Patient has a JP drain that is going into the subcutaneous tissue of the ostomy takedown cavity which is currently draining serosanguineous fluid small volume.  Labs:   Recent Labs  07/17/16 0552 07/18/16 0552  WBC 14.2* 11.8*  HGB 9.1* 9.2*  HCT 26.8* 26.7*  PLT 181 177    Recent Labs  07/16/16 0441 07/18/16 0552  NA 137 140  K 5.2* 4.0  CL 112* 104  CO2 21* 31  GLUCOSE 303* 146*  BUN 40* 29*  CREATININE 2.16* 1.83*  CALCIUM 8.7* 8.0*   No results for input(s): LABPROT, INR in the last 72 hours.  Imaging: No results found.  Assessment/Plan: This is 70 year old female status post ileostomy  takedown currently improving and doing well.  -Will continue her regular diet and home medications. Appreciate the help from medicine team with management of her comorbidities. -Will order physical therapy to help the patient with ambulation and evaluate the patient for possible rehabilitation needs. -We'll also order social work consult to help [place] with disposition planning for possible discharge early next week.   Melvyn Neth, [place]

## 2016-07-18 NOTE — Progress Notes (Signed)
[name] NAME: [name]    MR#:  [id number]  DATE OF BIRTH:  Dec 04, 1945  SUBJECTIVE:  complaints of back pain but no other complaint.   CHIEF COMPLAINT:   Chief Complaint  Patient presents with  . Weakness   Nausea and vomiting have resolved. Some abdominal pain at surgical site Patient had ileostomy reversal 07/15/2016.  REVIEW OF SYSTEMS:  CONSTITUTIONAL: No fever, fatigue or weakness.  EYES: No blurred or double vision.  EARS, NOSE, AND THROAT: No tinnitus or ear pain.  RESPIRATORY: No cough, shortness of breath, wheezing or hemoptysis.  CARDIOVASCULAR: No chest pain, orthopnea, edema.  GASTROINTESTINAL: No nausea, vomiting, diarrhea or abdominal pain.  GENITOURINARY: No dysuria, hematuria.  ENDOCRINE: No polyuria, nocturia,  HEMATOLOGY: No anemia, easy bruising or bleeding SKIN: No rash or lesion. MUSCULOSKELETAL: Complains of back pain. NEUROLOGIC: No tingling, numbness, weakness.  PSYCHIATRY: No anxiety or depression.   ROS  DRUG ALLERGIES:   Allergies  Allergen Reactions  . Doxycycline Other (See Comments)    Reaction: unknown  . Levaquin [Levofloxacin] Other (See Comments)    Causes joint pain  . Penicillins Rash and Other (See Comments)    Has patient had a PCN reaction causing immediate rash, facial/tongue/throat swelling, SOB or lightheadedness with hypotension: Yes Has patient had a PCN reaction causing severe rash involving mucus membranes or skin necrosis: No Has patient had a PCN reaction that required hospitalization No Has patient had a PCN reaction occurring within the last 10 years: No If all of the above answers are "NO", then may proceed with Cephalosporin use.  . Sulfa Antibiotics Swelling and Rash    VITALS:  Blood pressure (!) 141/62, pulse 77, temperature 98 F (36.7 C), temperature source Oral, resp. rate 20, height 5\' 6"  (1.676 m), weight 98 kg (216 lb), SpO2 100 %.  PHYSICAL  EXAMINATION:   GENERAL:  70 y.o.-year-old patient lying in the bed with no acute distress.  EYES: Pupils equal, round, reactive to light and accommodation. No scleral icterus. Extraocular muscles intact.  HEENT: Head atraumatic, normocephalic. Oropharynx and nasopharynx clear. Mucosa dry. NECK:  Supple, no jugular venous distention. No thyroid enlargement, no tenderness.  LUNGS: Normal breath sounds bilaterally, no wheezing, rales,rhonchi or crepitation. No use of accessory muscles of respiration.  CARDIOVASCULAR: S1, S2 normal. No murmurs, rubs, or gallops.  ABDOMEN: Dressing over surgical site. EXTREMITIES: No pedal edema, cyanosis, or clubbing.  NEUROLOGIC: Cranial nerves II through XII are intact. Muscle strength 5/5 in all extremities. Sensation intact. Gait not checked.  PSYCHIATRIC: The patient is alert and oriented x 3.  SKIN: No obvious rash, lesion, or ulcer.   Physical Exam LABORATORY PANEL:   CBC  Recent Labs Lab 07/18/16 0552  WBC 11.8*  HGB 9.2*  HCT 26.7*  PLT 177   ------------------------------------------------------------------------------------------------------------------  Chemistries   Recent Labs Lab 07/18/16 0552  NA 140  K 4.0  CL 104  CO2 31  GLUCOSE 146*  BUN 29*  CREATININE 1.83*  CALCIUM 8.0*   ------------------------------------------------------------------------------------------------------------------  Cardiac Enzymes  Recent Labs Lab 07/11/16 1221 07/11/16 1533  TROPONINI 0.10* 0.09*   ------------------------------------------------------------------------------------------------------------------  RADIOLOGY:  No results found.  ASSESSMENT AND PLAN:   Principal Problem:   Acute on chronic renal failure (HCC) Active Problems:   Dehydration   * Acute Kidney injury on CKD stage III - resolved,Monitor kidney function today.   Due to Dehydration   Renal ultrasound showed no obstruction Discontinue IV fluids  of the  kidney function improves, encourage by mouth intake.   Avoid diuretics and nephrotoxins.  * History of diverticulitis status post ileostomy  Ileostomy reversal surgery 07/15/2016   * Hyperkalemia Improved Stop lisinopril  * Hypertension;Controlled.    Amlodipin, metorpolol., Hydralazine.  can have these medications at discharge. Can have outpatient follow-up  With  primary doctor to resume the lisinopril. Hold  lisinopril.  * DM   On lantus and SSI. Increased dose of Lantus. Added pre-meal NovoLog.  * Anemia of chronic disease Monitor. Transfuse if less than 7. Physical therapy consult. No back pain likely musculoskeletal left. Patient requesting heating pad.  All the records are reviewed and case discussed with Care Management/Social Workerr. Management plans discussed with the patient, family and they are in agreement.  CODE STATUS: Full  TOTAL TIME TAKING CARE OF THIS PATIENT: 20 minutes.    Epifanio Lesches M.D on 07/18/2016   Between 7am to 6pm - Pager - [phone]  After 6pm go to [email or web address] - password [email or web address] [place] [facility]  Office  [phone]  CC: Primary care physician; Glendon Axe, MD  Note: This dictation was prepared with Dragon dictation along with smaller phrase technology. Any transcriptional errors that result from this process are unintentional.

## 2016-07-18 NOTE — Evaluation (Signed)
Physical Therapy Evaluation Patient Details Name: [name] MRN: [id number] DOB: April 03, 1946 Today's Date: 07/18/2016   History of Present Illness  Pt. 70 y.o. female presented with dehydration and hyperkalemia, recent ileostomy, now surgical repair of ileostomy POD 3.  Diverticulitis, DM, CKD 3.  Clinical Impression  Pt is up to walk with assistance and is looking relatively unsteady.  Her plan is to restore strength and mobility with SNF completion of care as started, and will follow acutely as well to ensure her safe transition to home faster from SNF.    Follow Up Recommendations SNF    Equipment Recommendations  Rolling walker with 5" wheels    Recommendations for Other Services Rehab consult     Precautions / Restrictions Precautions Precautions: Fall Restrictions Weight Bearing Restrictions: No      Mobility  Bed Mobility Overal bed mobility: Needs Assistance Bed Mobility: Supine to Sit;Sit to Supine     Supine to sit: Mod assist Sit to supine: Mod assist   General bed mobility comments: lifted trunk to get up and back with assist of legs  Transfers Overall transfer level: Needs assistance Equipment used: Rolling walker (2 wheeled) Transfers: Sit to/from [facility] Sit to Stand: Min guard Stand pivot transfers: Min guard       General transfer comment: reminded for hand placement but pt is mostly aware  Ambulation/Gait Ambulation/Gait assistance: Min guard;Min assist Ambulation Distance (Feet): 40 Feet   Gait Pattern/deviations: Step-through pattern;Narrow base of support;Decreased stride length;Decreased dorsiflexion - right;Decreased dorsiflexion - left Gait velocity: reduced Gait velocity interpretation: Below normal speed for age/gender General Gait Details: pt is up with some complaints of feeling very warm.  Adjusted room temp and nursing in to check her BS.  Was 99, nursing gave her graham cracker.  Stairs             Wheelchair Mobility    Modified Rankin (Stroke Patients Only)       Balance Overall balance assessment: Needs assistance Sitting-balance support: Feet supported Sitting balance-Leahy Scale: Good     Standing balance support: Bilateral upper extremity supported Standing balance-Leahy Scale: Fair                               Pertinent Vitals/Pain Pain Assessment: Faces Faces Pain Scale: Hurts a little bit Pain Location: abdomen Pain Intervention(s): Monitored during session;Premedicated before session;Repositioned;Limited activity within patient's tolerance    Home Living Family/patient expects to be discharged to:: Skilled nursing facility Living Arrangements: Children Available Help at Discharge: Family Type of Home: House Home Access: Level entry     Home Layout: One level Home Equipment: Environmental consultant - 2 wheels;Cane - single point      Prior Function Level of Independence: Independent with assistive device(s)         Comments: used RW at SNF but HHA with nurses here     Hand Dominance        Extremity/Trunk Assessment   Upper Extremity Assessment: Overall WFL for tasks assessed           Lower Extremity Assessment: Generalized weakness      Cervical / Trunk Assessment: Normal  Communication   Communication: No difficulties  Cognition Arousal/Alertness: Awake/alert Behavior During Therapy: WFL for tasks assessed/performed Overall Cognitive Status: Within Functional Limits for tasks assessed                      General  Comments General comments (skin integrity, edema, etc.): Pt was sweaty on her back but did feel warm and asked nursing to check her temp    Exercises     Assessment/Plan    PT Assessment Patient needs continued PT services  PT Problem List Decreased strength;Decreased range of motion;Decreased activity tolerance;Decreased balance;Decreased mobility;Decreased coordination;Decreased knowledge of use of  DME;Decreased safety awareness;Cardiopulmonary status limiting activity;Obesity          PT Treatment Interventions DME instruction;Gait training;Stair training;Balance training;Therapeutic exercise;Therapeutic activities    PT Goals (Current goals can be found in the Care Plan section)  Acute Rehab PT Goals Patient Stated Goal: To return home  PT Goal Formulation: With patient Time For Goal Achievement: 07/31/16 Potential to Achieve Goals: Good    Frequency Min 2X/week   Barriers to discharge Decreased caregiver support pt is going to need around the clock monitoring    Co-evaluation               End of Session Equipment Utilized During Treatment: Gait belt Activity Tolerance: Patient tolerated treatment well;Patient limited by fatigue Patient left: in bed;with call bell/phone within reach Nurse Communication: Mobility status         Time: [id number] PT Time Calculation (min) (ACUTE ONLY): 25 min   Charges:   PT Evaluation $PT Eval Low Complexity: 1 Procedure PT Treatments $Gait Training: 8-22 mins   PT G CodesRamond Dial July 21, 2016, 5:15 PM    Mee Hives, PT MS Acute Rehab Dept. Number: [place] [facility] [place]

## 2016-07-18 NOTE — Clinical Social Work Note (Signed)
CSW visited patient at bedside to confirm verbal permission to conduct SNF bed search. Patient gave permission, but she indicated that she is not sure that she wants the service and wants to have Pt. Patient gave no preference for SNF choice if recommended, but she reported that she would make a decision at that time. CSW began bed search and will con't to follow.  Santiago Bumpers, MSW, LCSW-A [phone]

## 2016-07-18 NOTE — NC FL2 (Signed)
[place] LEVEL OF CARE SCREENING TOOL     IDENTIFICATION  Patient Name: [name] Birthdate: 1946/02/17 Sex: female Admission Date (Current Location): 07/11/2016  [place] and [place] Number:  Engineering geologist and Address:  [facility], [address] [place],  [address]      Provider Number: [id number]  Attending Physician Name and Address:  Dia Crawford III, MD  Relative Name and Phone Number:       Current Level of Care: Hospital Recommended Level of Care: [place] Prior Approval Number:    Date Approved/Denied: 06/15/16 PASRR Number: [id number]  Discharge Plan: SNF    Current Diagnoses: Patient Active Problem List   Diagnosis Date Noted  . Dehydration   . Acute on chronic renal failure ([place]) 07/11/2016  . Temporal arteritis ([place]) 07/07/2016  . Diverticulitis of colon   . Hypertensive heart disease without heart failure 05/10/2016  . Snoring 05/10/2016  . Grief 02/22/2016  . 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 Pyoderma gangrenosa 11/12/2015  . Demand ischemia ([place]) 11/07/2015  . Uncontrolled hypertension 11/05/2015  . Uncontrolled diabetes mellitus ([place]) 11/05/2015  . Type 2 diabetes mellitus with hyperglycemia ([place]) 11/05/2015  . 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 Varicose veins of left lower extremity with ulcer of calf ([place]) 08/04/2015  . Venous stasis ulcer of ankle ([place]) 07/15/2015  . Chronic kidney disease, stage 4, severely decreased GFR (HCC) 03/19/2015  . Bilateral carpal tunnel syndrome 03/01/2014  . Degenerative joint disease (DJD) of hip 03/01/2014  . Hyperlipidemia 03/01/2014  . Hypothyroid 03/01/2014    Orientation RESPIRATION BLADDER Height & Weight     Self, Time, Situation  Normal Continent Weight: 216 lb (98 kg) Height:   5\' 6"  (167.6 cm)  BEHAVIORAL SYMPTOMS/MOOD NEUROLOGICAL BOWEL NUTRITION STATUS   (none)  (none) Continent Diet (clear liquids to be advanced)  AMBULATORY STATUS COMMUNICATION OF NEEDS Skin   Extensive Assist Verbally Surgical wounds                       Personal Care Assistance Level of Assistance  Bathing, Dressing Bathing Assistance: Maximum assistance   Dressing Assistance: Maximum assistance     Functional Limitations Info   (none)          SPECIAL CARE FACTORS FREQUENCY  PT (By licensed PT)     PT Frequency: 5X day 5X week              Contractures Contractures Info: Not present    Additional Factors Info  Allergies Code Status Info: full Allergies Info: Doxycycline, Levaquin Levofloxacin, Penicillins, Sulfa Antibiotics           Current Medications (07/18/2016):  This is the current hospital active medication list Current Facility-Administered Medications  Medication Dose Route Frequency Provider Last Rate Last Dose  . acetaminophen (TYLENOL) tablet 500 mg  500 mg Oral Q6H PRN Vaughan Basta, MD      . amLODipine (NORVASC) tablet 10 mg  10 mg Oral Daily Vaughan Basta, MD   10 mg at 07/18/16 0939  . aspirin EC tablet 81 mg  81 mg Oral BID Vaughan Basta, MD   81 mg at 07/18/16 0939  . atorvastatin (LIPITOR) tablet 40 mg  40 mg Oral Daily Vaughan Basta, MD   40 mg at 07/18/16 0939  . gabapentin (NEURONTIN)  capsule 100 mg  100 mg Oral TID Vaughan Basta, MD   100 mg at 07/18/16 0939  . heparin injection 5,000 Units  5,000 Units Subcutaneous Q8H Vaughan Basta, MD   5,000 Units at 07/18/16 0516  . hydrALAZINE (APRESOLINE) injection 10 mg  10 mg Intravenous Q6H PRN Srikar Sudini, MD      . hydrALAZINE (APRESOLINE) tablet 50 mg  50 mg Oral Q8H Srikar Sudini, MD   50 mg at 07/18/16 0517  . HYDROcodone-acetaminophen (NORCO/VICODIN) 5-325 MG per tablet 1-2 tablet  1-2 tablet Oral Q6H PRN Dia Crawford III, MD   2 tablet  at 07/15/16 1620  . HYDROmorphone (DILAUDID) injection 1 mg  1 mg Intravenous Q2H PRN Dia Crawford III, MD      . insulin aspart (novoLOG) injection 0-20 Units  0-20 Units Subcutaneous TID WC Alexis Hugelmeyer, DO   3 Units at 07/18/16 0840  . insulin aspart (novoLOG) injection 0-5 Units  0-5 Units Subcutaneous QHS Alexis Hugelmeyer, DO   3 Units at 07/15/16 2254  . insulin aspart (novoLOG) injection 6 Units  6 Units Subcutaneous TID WC Hillary Bow, MD   6 Units at 07/18/16 0840  . insulin glargine (LANTUS) injection 18 Units  18 Units Subcutaneous QHS Hillary Bow, MD   18 Units at 07/17/16 2130  . LORazepam (ATIVAN) tablet 0.5 mg  0.5 mg Oral Q8H PRN Vaughan Basta, MD      . metoprolol (LOPRESSOR) tablet 50 mg  50 mg Oral BID Vaughan Basta, MD   50 mg at 07/18/16 0939  . ondansetron (ZOFRAN) injection 4 mg  4 mg Intravenous Q6H PRN Vaughan Basta, MD   4 mg at 07/12/16 0802  . pantoprazole (PROTONIX) EC tablet 40 mg  40 mg Oral Daily Vaughan Basta, MD   40 mg at 07/18/16 0939  . predniSONE (DELTASONE) tablet 2.5 mg  2.5 mg Oral Daily Vaughan Basta, MD   2.5 mg at 07/18/16 0939  . sodium chloride 0.225 % with sodium bicarbonate 100 mEq infusion   Intravenous Continuous Vaughan Basta, MD 75 mL/hr at 07/18/16 0854       Discharge Medications: Please see discharge summary for a list of discharge medications.  Relevant Imaging Results:  Relevant Lab Results:   Additional Information ss: [id number]  Zettie Pho, LCSW

## 2016-07-19 LAB — GLUCOSE, CAPILLARY
GLUCOSE-CAPILLARY: 191 mg/dL — AB (ref 65–99)
GLUCOSE-CAPILLARY: 155 mg/dL — AB (ref 65–99)
GLUCOSE-CAPILLARY: 102 mg/dL — AB (ref 65–99)
GLUCOSE-CAPILLARY: 101 mg/dL — AB (ref 65–99)
Glucose-Capillary: 121 mg/dL — ABNORMAL HIGH (ref 65–99)
Glucose-Capillary: 43 mg/dL — CL (ref 65–99)
Glucose-Capillary: 68 mg/dL (ref 65–99)

## 2016-07-19 MED ORDER — ASPIRIN EC 81 MG PO TBEC
81.0000 mg | DELAYED_RELEASE_TABLET | Freq: Every day | ORAL | Status: DC
  Administered 2016-07-20 – 2016-07-21 (×2): 81 mg via ORAL
  Filled 2016-07-19 (×2): qty 1

## 2016-07-19 NOTE — Progress Notes (Addendum)
[place] at [place] NAME: [name]    MR#:  [id number]  DATE OF BIRTH:  09-Aug-1946  SUBJECTIVE:  complaints of back pain but no other complaint.   CHIEF COMPLAINT:   Chief Complaint  Patient presents with  . Weakness   Patient had ileostomy reversal 07/15/2016. No complaint.  REVIEW OF SYSTEMS:  CONSTITUTIONAL: No fever, fatigue or weakness.  EYES: No blurred or double vision.  EARS, NOSE, AND THROAT: No tinnitus or ear pain.  RESPIRATORY: No cough, shortness of breath, wheezing or hemoptysis.  CARDIOVASCULAR: No chest pain, orthopnea, edema.  GASTROINTESTINAL: No nausea, vomiting, diarrhea or abdominal pain.  GENITOURINARY: No dysuria, hematuria.  ENDOCRINE: No polyuria, nocturia,  HEMATOLOGY: No anemia, easy bruising or bleeding SKIN: No rash or lesion. MUSCULOSKELETAL: Complains of back pain. NEUROLOGIC: No tingling, numbness, weakness.  PSYCHIATRY: No anxiety or depression.   ROS  DRUG ALLERGIES:   Allergies  Allergen Reactions  . Doxycycline Other (See Comments)    Reaction: unknown  . Levaquin [Levofloxacin] Other (See Comments)    Causes joint pain  . Penicillins Rash and Other (See Comments)    Has patient had a PCN reaction causing immediate rash, facial/tongue/throat swelling, SOB or lightheadedness with hypotension: Yes Has patient had a PCN reaction causing severe rash involving mucus membranes or skin necrosis: No Has patient had a PCN reaction that required hospitalization No Has patient had a PCN reaction occurring within the last 10 years: No If all of the above answers are "NO", then may proceed with Cephalosporin use.  . Sulfa Antibiotics Swelling and Rash    VITALS:  Blood pressure (!) 145/73, pulse 75, temperature 98.1 F (36.7 C), temperature source Oral, resp. rate 17, height 5\' 6"  (1.676 m), weight 216 lb (98 kg), SpO2 97 %.  PHYSICAL EXAMINATION:   GENERAL:  70 y.o.-year-old patient lying in  the bed with no acute distress.  EYES: Pupils equal, round, reactive to light and accommodation. No scleral icterus. Extraocular muscles intact.  HEENT: Head atraumatic, normocephalic. Oropharynx and nasopharynx clear. Mucosa dry. NECK:  Supple, no jugular venous distention. No thyroid enlargement, no tenderness.  LUNGS: Normal breath sounds bilaterally, no wheezing, rales,rhonchi or crepitation. No use of accessory muscles of respiration.  CARDIOVASCULAR: S1, S2 normal. No murmurs, rubs, or gallops.  ABDOMEN: Dressing over surgical site with a little bloody drainage. EXTREMITIES: No pedal edema, cyanosis, or clubbing.  NEUROLOGIC: Cranial nerves II through XII are intact. Muscle strength 5/5 in all extremities. Sensation intact. Gait not checked.  PSYCHIATRIC: The patient is alert and oriented x 3.  SKIN: No obvious rash, lesion, or ulcer.   Physical Exam LABORATORY PANEL:   CBC  Recent Labs Lab 07/18/16 0552  WBC 11.8*  HGB 9.2*  HCT 26.7*  PLT 177   ------------------------------------------------------------------------------------------------------------------  Chemistries   Recent Labs Lab 07/18/16 0552  NA 140  K 4.0  CL 104  CO2 31  GLUCOSE 146*  BUN 29*  CREATININE 1.83*  CALCIUM 8.0*   ------------------------------------------------------------------------------------------------------------------  Cardiac Enzymes No results for input(s): TROPONINI in the last 168 hours. ------------------------------------------------------------------------------------------------------------------  RADIOLOGY:  No results found.  ASSESSMENT AND PLAN:   Principal Problem:   Acute on chronic renal failure (HCC) Active Problems:   Dehydration   * Acute Kidney injury on CKD stage III - resolved,Monitor kidney function today.   Due to Dehydration   Renal ultrasound showed no obstruction Discontinued IV fluids, the kidney function improves, encourage by mouth  intake.   Avoid diuretics and nephrotoxins.  * History of diverticulitis status post ileostomy  Ileostomy reversal surgery 07/15/2016   * Hyperkalemia Improved Stopped lisinopril  * Hypertension;Controlled.    Amlodipin, metorpolol., Hydralazine.  can have these medications at discharge. Can have outpatient follow-up  With  primary doctor to resume the lisinopril. Hold  lisinopril.  * DM   On lantus and SSI. On Lantus 18 units HS and novolog 6 unit AC.  Hypoglycemia. One episode, BS was 43.  Given orange juice per RN. Encourage oral intake. Repeat BS 102.  * Anemia of chronic disease. Stable.  Physical therapy consult: SNF. Possible discharge tomorrow. Sign off.  All the records are reviewed and case discussed with RN, Care Management/Social Workerr. Management plans discussed with the patient, family and they are in agreement.  CODE STATUS: Full code.  TOTAL TIME TAKING CARE OF THIS PATIENT: 33 minutes.    Demetrios Loll M.D on 07/19/2016   Between 7am to 6pm - Pager - [phone]  After 6pm go to [email or web address] - password [email or web address] [facility]  Office  [phone]  CC: Primary care physician; Glendon Axe, MD  Note: This dictation was prepared with Dragon dictation along with smaller phrase technology. Any transcriptional errors that result from this process are unintentional.

## 2016-07-19 NOTE — Progress Notes (Signed)
POD # 4 ileostomy take down Doing better taking some PO WBC trending down AVSS Creat stable  PE NAD Abd: JP w dark fluid, incision w staples but some serous drainage, no active infection but there is some induration around the wound, no peritonitis  A/p Continue supportive care Mobilize Transfer to rehab Wednesday

## 2016-07-19 NOTE — Progress Notes (Signed)
Physical Therapy Treatment Patient Details Name: TYRESHIA FERGERSON MRN: [id number] DOB: 1946-10-13 Today's Date: 07/19/2016    History of Present Illness Pt. 70 y.o. female presented with dehydration and hyperkalemia, recent ileostomy, now surgical repair of ileostomy POD 3.  Diverticulitis, DM, CKD 3.    PT Comments    Pt initially sleeping lightly; easily awoken and agreeable to PT. Pt notes soreness along lower right abdomen. Pt demonstrates Mod I with all bed mobility and transfers. Pt requires only Min guard for safety with ambulation and stair climbing today. Pt demonstrates good safety awareness with all functional mobility and no loss of balance. Pt ambulates over 200 feet with rolling walker and performs up/down 3 steps. Recommendation after discharge changed to home health PT at this time. Continue PT to contintue to progress strength and endurance to improve functional mobility toward prior level of function.  Follow Up Recommendations  Home health PT     Equipment Recommendations       Recommendations for Other Services       Precautions / Restrictions Precautions Precautions: Fall Restrictions Weight Bearing Restrictions: No    Mobility  Bed Mobility Overal bed mobility: Modified Independent Bed Mobility: Supine to Sit;Sit to Supine     Supine to sit: Modified independent (Device/Increase time) Sit to supine: Modified independent (Device/Increase time)   General bed mobility comments: Use of rails and mild increased time/effort  Transfers Overall transfer level: Needs assistance Equipment used: Rolling walker (2 wheeled) Transfers: Sit to/from Stand Sit to Stand: Supervision         General transfer comment: Supervision for safety; good hand placement  Ambulation/Gait Ambulation/Gait assistance: Min guard Ambulation Distance (Feet): 230 Feet Assistive device: Rolling walker (2 wheeled) Gait Pattern/deviations: Step-through pattern;WFL(Within Functional  Limits) Gait velocity: reduced Gait velocity interpretation:  (2 ft/sec) General Gait Details: No balance issues; lower than baseline, as pt does not normally use rw although she own one. Slower subjectively than baseline. No LOB or safety issues   Stairs Stairs: Yes Stairs assistance: Min guard Stair Management: One rail Right;Step to pattern;Forwards;Sideways Number of Stairs: 3 General stair comments: Descends with 1 UE support on R step to; ascends sideways with 2 UE support on 1 rail.   Wheelchair Mobility    Modified Rankin (Stroke Patients Only)       Balance Overall balance assessment: Modified Independent                                  Cognition Arousal/Alertness: Awake/alert (Initially napping; easily awoken) Behavior During Therapy: WFL for tasks assessed/performed Overall Cognitive Status: Within Functional Limits for tasks assessed                      Exercises      General Comments        Pertinent Vitals/Pain Pain Assessment:  (States R lower abdomen sore, primarily with sit to stand) Pain Location: R lower abdomen Pain Intervention(s): Monitored during session    Home Living                      Prior Function            PT Goals (current goals can now be found in the care plan section)      Frequency    Min 2X/week      PT Plan Discharge plan needs to be updated  Co-evaluation             End of Session Equipment Utilized During Treatment: Gait belt Activity Tolerance: Patient tolerated treatment well Patient left: in bed;with call bell/phone within reach;with bed alarm set     Time: 1359-1425 PT Time Calculation (min) (ACUTE ONLY): 26 min  Charges:  $Gait Training: 23-37 mins                    G CodesLarae Grooms, PTA 07/19/2016, 2:37 PM

## 2016-07-19 NOTE — Clinical Social Work Note (Signed)
Patient has received authorization to go to [facility] tomorrow: [id number] [name] Leff MSW,LCSW [phone]

## 2016-07-19 NOTE — Clinical Social Work Note (Signed)
CSW spoke with patient this afternoon regarding if she has decided whether or not she wished to return home at discharge or consider short term rehab again. Patient stated that she is going to return home with her daughter who lives in [place]. She feels as though she is doing well enough to bypass STR and she is open to home health if needed.  [name] Leff MSW,LCSW [phone]

## 2016-07-19 NOTE — Care Management Important Message (Signed)
Important Message  Patient Details  Name: [name] MRN: [id number] Date of Birth: 11/26/1945   Medicare Important Message Given:  Yes    Beverly Sessions, RN 07/19/2016, 3:02 PM

## 2016-07-19 NOTE — Progress Notes (Signed)
Nutrition Follow-up  DOCUMENTATION CODES:   Obesity unspecified  INTERVENTION:  -Encourage smaller, more frequent meals -Discussed nutritional supplementation if po intake does not improve -Cater to pt preferences to promote po intake  NUTRITION DIAGNOSIS:   Inadequate oral intake related to nausea, poor appetite as evidenced by per patient/family report.  Improving as diet advanced and pt tolerating some po  GOAL:   Patient will meet greater than or equal to 90% of their needs  MONITOR:   PO intake, Labs, I & O's, Skin, Weight trends  REASON FOR ASSESSMENT:   Malnutrition Screening Tool    ASSESSMENT:   [name]  is a 70 y.o. female with a known history of CHF, HLD, Htn, CKD, DM, CAD- recent admission to [facility] for diverticulitis and ileostomy was done, sent to rehab- recovered some. Went home 3-4 days ago. For last 4-5 days have nausea and vomit, not eating enough and having generalized back pain  Pt s/p ileostomy reversal (post-op day 4); diet advanced, tolerating some po although appetite remains poor. Pt reports some nausea but no vomiting, +3 BMs today.  Diet Order:  Diet Carb Modified Fluid consistency: Thin; Room service appropriate? Yes  Skin:  Reviewed, no issues  Last BM:  9/25   Labs:  Glucose Profile:   Recent Labs  07/19/16 0748 07/19/16 1025 07/19/16 1151  GLUCAP 121* 191* 155*   Meds: prednisone, lantus, ss novolog, novolog with meals  Height:   Ht Readings from Last 1 Encounters:  07/11/16 5\' 6"  (1.676 m)    Weight:   Wt Readings from Last 1 Encounters:  07/11/16 216 lb (98 kg)   Filed Weights   07/11/16 1217  Weight: 216 lb (98 kg)    Ideal Body Weight:  59.09 kg  BMI:  Body mass index is 34.86 kg/m.  Estimated Nutritional Needs:   Kcal:  [id number] calories  Protein:  75-95 gm  Fluid:  >/= 1.7L  EDUCATION NEEDS:   Education needs addressed  Kerman Passey Powers, [place], LDN [phone] Pager  [phone]  Weekend/On-Call Pager

## 2016-07-19 NOTE — Progress Notes (Signed)
Communicated with hospitalist about patient low blood sugar in 40's patient stated she did not eat much at all for lunch. Orange juice given and patient blood sugar resulted in 102

## 2016-07-19 NOTE — Care Management (Signed)
Met with patient to provide IM and and assess for home health.  Patient states that she was recently discharged from [facility] and is now staying with her daughter.  Patient obtains scripts from [facility] on [address].  Granddaughter will provide transportation post discharge.  Patient has [name], rolltor, WC, hoyer lift, hospital bed. At end of assessment patient states that she spoke to her son, who suggests that she follow through with placement at SNF.  CSW notified.  RNCM available if needed for discharge disposition.

## 2016-07-19 NOTE — Clinical Social Work Note (Signed)
CSW was asked to return to patient's room. Patient stated her son called her and told her she had to go to rehab. CSW informed patient that she was capable of making her own decisions and if she wanted to go to rehab then we could arrange this and if she didn't want to go to rehab, home services could be arranged. Patient stated she would go ahead with rehab at [facility]. CSW has notified [facility] and has notified Amy at [facility] for prior [place].  [name] Leff MSW,LCSW [phone]

## 2016-07-20 LAB — BASIC METABOLIC PANEL
ANION GAP: 7 (ref 5–15)
BUN: 21 mg/dL — ABNORMAL HIGH (ref 6–20)
CHLORIDE: 103 mmol/L (ref 101–111)
CO2: 29 mmol/L (ref 22–32)
CREATININE: 1.8 mg/dL — AB (ref 0.44–1.00)
Calcium: 8.3 mg/dL — ABNORMAL LOW (ref 8.9–10.3)
GFR calc non Af Amer: 27 mL/min — ABNORMAL LOW (ref >60)
GFR, EST AFRICAN AMERICAN: 32 mL/min — AB (ref >60)
Glucose, Bld: 182 mg/dL — ABNORMAL HIGH (ref 65–99)
POTASSIUM: 4 mmol/L (ref 3.5–5.1)
SODIUM: 139 mmol/L (ref 135–145)

## 2016-07-20 LAB — CBC
HEMATOCRIT: 27.2 % — AB (ref 35.0–47.0)
HEMOGLOBIN: 9.1 g/dL — AB (ref 12.0–16.0)
MCH: 29.6 pg (ref 26.0–34.0)
MCHC: 33.3 g/dL (ref 32.0–36.0)
MCV: 88.9 fL (ref 80.0–100.0)
Platelets: 179 10*3/uL (ref 150–440)
RBC: 3.06 MIL/uL — AB (ref 3.80–5.20)
RDW: 15.1 % — ABNORMAL HIGH (ref 11.5–14.5)
WBC: 11 10*3/uL (ref 3.6–11.0)

## 2016-07-20 LAB — GLUCOSE, CAPILLARY
GLUCOSE-CAPILLARY: 171 mg/dL — AB (ref 65–99)
GLUCOSE-CAPILLARY: 123 mg/dL — AB (ref 65–99)
Glucose-Capillary: 151 mg/dL — ABNORMAL HIGH (ref 65–99)

## 2016-07-20 MED ORDER — HYDRALAZINE HCL 25 MG PO TABS
25.0000 mg | ORAL_TABLET | Freq: Three times a day (TID) | ORAL | Status: DC
  Administered 2016-07-21: 25 mg via ORAL
  Filled 2016-07-20 (×2): qty 1

## 2016-07-20 MED ORDER — AMLODIPINE BESYLATE 5 MG PO TABS
5.0000 mg | ORAL_TABLET | Freq: Every day | ORAL | Status: DC
  Administered 2016-07-21: 5 mg via ORAL
  Filled 2016-07-20: qty 1

## 2016-07-20 NOTE — Progress Notes (Signed)
Inpatient Diabetes Program Recommendations  AACE/ADA: New Consensus Statement on Inpatient Glycemic Control (2015)  Target Ranges:  Prepandial:   less than 140 mg/dL      [facility] postprandial:   less than 180 mg/dL (1-2 hours)      Critically ill patients:  140 - 180 mg/dL  Results for [name], [name] (MRN [id number]) as of 07/20/2016 11:47  Ref. Range 07/19/2016 07:48 07/19/2016 10:25 07/19/2016 11:51 07/19/2016 15:33 07/19/2016 15:54 07/19/2016 16:45 07/19/2016 21:00 07/20/2016 07:53  Glucose-Capillary Latest Ref Range: 65 - 99 mg/dL 121 (H) 191 (H) 155 (H) 43 (LL) 68 102 (H) 101 (H) 171 (H)    Review of Glycemic Control  Diabetes history: DM2 Outpatient Diabetes medications: Lantus 25 units QHS, Novolog 12 units TID with meals, Actos 15 mg daily Current orders for Inpatient glycemic control: Lantus 18 units QHS, Novolog 6 units TID with meals, Novolog 0-20 units TID with meals, Novolog 0-5 units QHS  Inpatient Diabetes Program Recommendations: Correction (SSI): In reviewing chart, noted patient's fasting glucose was 121 mg/dl on 07/19/16 and then 191 mg/dl at 10:25 am on 07/19/16. Patient received Novolog 10 units (meal coverage and SSI) at 10:32 am on 07/19/16. CBG 155 mg/dl at 11:51 and patient received Novolog 10 units (meal coverage and SSI) at 12:59 on 07/19/16. Following CBG down to 43 mg/dl at 15:33 likely due to receiving Novolog 8 am dose late and so close to noon time correction and meal coverage dose. NURSING: Please be sure to administer Novolog correction and meal coverage as scheduled when parameters are met.    Thanks, Barnie Alderman, RN, MSN, CDE Diabetes Coordinator Inpatient Diabetes Program [phone] (Team Pager from [place] to [place]) [phone] (AP office) [phone] [facility] office) [phone] [facility] office)

## 2016-07-20 NOTE — Progress Notes (Signed)
Chaplain visited patient while making rounds on the floor. Patient was calm and asked for prayers; chaplain offered prayers for healing and growth.     07/20/16 1110  Clinical Encounter Type  Visited With Patient  Visit Type Spiritual support  Spiritual Encounters  Spiritual Needs Prayer

## 2016-07-20 NOTE — Clinical Social Work Note (Signed)
PT had not yet gotten their documentation in yesterday when CSW went to speak with patient regarding STR. PT worked with patient yesterday and recommended home health. PT worked with patient again today and are recommending the same thing. CSW has left a message for Anderson Malta at [place]: [phone] to see if authorization would now be declined. Patient is now stating she will go home with home health and RN CM to arrange. [name] Leff MSW,LCSW [phone]

## 2016-07-20 NOTE — Progress Notes (Signed)
Physical Therapy Treatment Patient Details Name: [name] MRN: [id number] DOB: 08-17-1946 Today's Date: 07/20/2016    History of Present Illness Pt. 70 y.o. female presented with dehydration and hyperkalemia, recent ileostomy, now surgical repair of ileostomy POD 3.  Diverticulitis, DM, CKD 3.    PT Comments    Pt agreeable to PT; mild pain in right lower abdomen, otherwise, no complaints. Pt demonstrates Mod independence with use of bathroom and ambulating to/fro. Pt continues to ambulate well with rolling walker with supervision; short seated rest break between walks and before steps. Stair climbing with supervision as well today using sidestep technique with 1 rail; no loss of balance or safety issues. Pt challenged with sorting out family wishes for pt post acute care stay. PT recommendation is home with home health PT.   Follow Up Recommendations  Home health PT     Equipment Recommendations  Rolling walker with 5" wheels    Recommendations for Other Services       Precautions / Restrictions Precautions Precautions: Fall Restrictions Weight Bearing Restrictions: No    Mobility  Bed Mobility Overal bed mobility: Modified Independent                Transfers Overall transfer level: Modified independent   Transfers: Sit to/from Stand Sit to Stand: Modified independent (Device/Increase time)         General transfer comment: Sit to/from stand from bed and commode with rw. Good safety, no assist needed. No LOB  Ambulation/Gait Ambulation/Gait assistance: Supervision Ambulation Distance (Feet): 190 Feet (2nd walk 200 ft; also to/from bathroom) Assistive device: Rolling walker (2 wheeled) Gait Pattern/deviations: WFL(Within Functional Limits);Step-through pattern Gait velocity: reduced Gait velocity interpretation: Below normal speed for age/gender General Gait Details: Decreased speed from baseline and use of assistive device; otherwise, steady/safe and  no LOB   Stairs Stairs: Yes Stairs assistance: Supervision Stair Management: One rail Left;Step to pattern;Sideways (2 hands on 1 rail) Number of Stairs: 4 General stair comments: No issues  Wheelchair Mobility    Modified Rankin (Stroke Patients Only)       Balance Overall balance assessment: Modified Independent                                  Cognition Arousal/Alertness: Awake/alert Behavior During Therapy: WFL for tasks assessed/performed Overall Cognitive Status: Within Functional Limits for tasks assessed                      Exercises      General Comments        Pertinent Vitals/Pain Pain Assessment:  (notes minimal discomfort at incision site; R lower abdomen) Pain Intervention(s): Monitored during session    Home Living                      Prior Function            PT Goals (current goals can now be found in the care plan section) Progress towards PT goals: Progressing toward goals    Frequency    Min 2X/week      PT Plan Current plan remains appropriate    Co-evaluation             End of Session Equipment Utilized During Treatment: Gait belt Activity Tolerance: Patient tolerated treatment well Patient left: Other (comment) (sitting edge of bed for lunch)     Time: [id number] PT  Time Calculation (min) (ACUTE ONLY): 33 min  Charges:  $Gait Training: 23-37 mins                    G Codes:      Larae Grooms, PTA 07/20/2016, 1:08 PM

## 2016-07-20 NOTE — Progress Notes (Signed)
This was a routine visit with this patient. Patience looked tired but alert. Patient complained about having stomach pains but was optimistic about recovering soon. I offered prayers and spiritual support that the patience appreciated.

## 2016-07-20 NOTE — Progress Notes (Signed)
POD # 5 ileostomy take down Doing better taking some PO WBC trending down AVSS Creat stable Slow progress  PE NAD Abd: soft, appropiate incisional tenderness staples in place and JP dark fluid  A/P Improving slowly We will recheck labs in am and make sure she can be DC home safely home w home health [facility] tomorrow

## 2016-07-21 LAB — BASIC METABOLIC PANEL
ANION GAP: 8 (ref 5–15)
BUN: 19 mg/dL (ref 6–20)
CO2: 28 mmol/L (ref 22–32)
Calcium: 8.5 mg/dL — ABNORMAL LOW (ref 8.9–10.3)
Chloride: 104 mmol/L (ref 101–111)
Creatinine, Ser: 1.92 mg/dL — ABNORMAL HIGH (ref 0.44–1.00)
GFR, EST AFRICAN AMERICAN: 29 mL/min — AB (ref >60)
GFR, EST NON AFRICAN AMERICAN: 25 mL/min — AB (ref >60)
Glucose, Bld: 183 mg/dL — ABNORMAL HIGH (ref 65–99)
POTASSIUM: 3.6 mmol/L (ref 3.5–5.1)
SODIUM: 140 mmol/L (ref 135–145)

## 2016-07-21 LAB — GLUCOSE, CAPILLARY: GLUCOSE-CAPILLARY: 187 mg/dL — AB (ref 65–99)

## 2016-07-21 MED ORDER — HYDROCODONE-ACETAMINOPHEN 5-325 MG PO TABS: 1.0000 | ORAL_TABLET | Freq: Four times a day (QID) | ORAL | 0 refills | Status: DC | PRN

## 2016-07-21 NOTE — Care Management (Signed)
Patient has been reassessed by PT and has been upgraded to home health PT.  Patient was provided with agency preference list and [facility] was selected.  I have notified Corene Cornea with [facility] of referral.  Patient to discharge home today.  Patient already has all home equipment needed.  RNCM signing off

## 2016-07-21 NOTE — Progress Notes (Signed)
Patient A&O, VSS.  No complaints of pain.  Dressings changed per MD order and teaching on same provided to patient and daughter.  Teaching also provided in detail regarding care of JP drain and measuring of output.  Understanding was verbalized and all questions were answered.  Patient discharged home via wheelchair in stable condition escorted by nursing staff.

## 2016-07-21 NOTE — Care Management (Signed)
Patient to discharge to her daugther's house [address] [place] [address].  [phone].   Corene Cornea with [facility] notified.

## 2016-07-22 ENCOUNTER — Ambulatory Visit: Payer: PPO | Attending: General Surgery

## 2016-07-22 ENCOUNTER — Encounter: Payer: Self-pay | Admitting: General Surgery

## 2016-07-22 NOTE — Discharge Summary (Signed)
Patient ID: [name] MRN: [id number] DOB/AGE: 06-27-1946 70 y.o.  Admit date: 07/11/2016 Discharge date: 07/22/2016   Discharge Diagnoses:  Principal Problem:   Acute on chronic renal failure [facility]) Active Problems:   Dehydration   Procedures:Ileostomy takedown  Hospital Course: This is a 70 year old very well known to our services status post perforated diverticulitis and a sigmoid colectomy with a running anastomosis and a loop ileostomy. Initially admitted to the hospital secondary to dehydration and acute kidney injury. Patient was admitted to the medicine team at work she was aggressively resuscitated and nephrology was consulted. Her acute kidney injury improved with hydration but there was persistent upper from the ileostomy. Dr. Pat Patrick who was the operative surgeons evaluated the patient and and once she was optimized from the medical perspective he went ahead and did an ileostomy takedown. I because of her multiple comorbidities and malnourishment he did also place a drain as well as retention sutures. He had a relatively uneventful postoperative course but did develop a mild ileus. Her diet was advanced slowly and and she was able to tolerate it. At the time of discharge she was ambulating, she was tolerating diet, her creatinine was back to baseline and no evidence of need for dialysis. Her physical exam revealed female in no acute distress awake alert, abdomen: Soft, staples in place with JP drain with some dark fluid. No evidence of infection. Retention sutures in place. Condition of patient on tissue stable we'll remove the staples next week and keep the Gore-Tex sutures for at least 4 more weeks.   Disposition: 01-Home or Self Care  Discharge Instructions    Call MD for:  difficulty breathing, headache or visual disturbances    Complete by:  As directed    Call MD for:  extreme fatigue    Complete by:  As directed    Call MD for:  hives    Complete by:  As directed     Call MD for:  persistant dizziness or light-headedness    Complete by:  As directed    Call MD for:  persistant nausea and vomiting    Complete by:  As directed    Call MD for:  redness, tenderness, or signs of infection (pain, swelling, redness, odor or green/yellow discharge around incision site)    Complete by:  As directed    Call MD for:  severe uncontrolled pain    Complete by:  As directed    Call MD for:  temperature >100.4    Complete by:  As directed    Change dressing (specify)    Complete by:  As directed    Daily drain sponge around drain, may place dry dressing daily to small open abd wound   Diet - low sodium heart healthy    Complete by:  As directed    Discharge instructions    Complete by:  As directed    Please teach pt about JP care. Empty BID   Increase activity slowly    Complete by:  As directed    Lifting restrictions    Complete by:  As directed    20 lbs 6 weeks       Medication List    STOP taking these medications   amoxicillin-clavulanate 875-125 MG tablet Commonly known as:  AUGMENTIN     TAKE these medications   acetaminophen 500 MG tablet Commonly known as:  TYLENOL Take 500 mg by mouth every 6 (six) hours as needed for mild pain, fever  or headache.   amLODipine 10 MG tablet Commonly known as:  NORVASC Take 10 mg by mouth daily.   aspirin 81 MG tablet Take 81 mg by mouth 2 (two) times daily.   atorvastatin 40 MG tablet Commonly known as:  LIPITOR Take 40 mg by mouth daily.   furosemide 20 MG tablet Commonly known as:  LASIX Take 40 mg by mouth daily.   gabapentin 100 MG capsule Commonly known as:  NEURONTIN Take 100 mg by mouth 3 (three) times daily.   hydrALAZINE 25 MG tablet Commonly known as:  APRESOLINE Take 25 mg by mouth 2 (two) times daily. Take another 25mg  if Systolic consistently over [id number] or dystolic over 90   hydrochlorothiazide 25 MG tablet Commonly known as:  HYDRODIURIL Take 25 mg by mouth daily.    HYDROcodone-acetaminophen 5-325 MG tablet Commonly known as:  NORCO/VICODIN Take 1-2 tablets by mouth every 6 (six) hours as needed for moderate pain.   ibuprofen 800 MG tablet Commonly known as:  ADVIL,MOTRIN Take 1 tablet (800 mg total) by mouth every 8 (eight) hours as needed.   insulin aspart 100 UNIT/ML injection Commonly known as:  novoLOG Inject 12 Units into the skin 3 (three) times daily.   insulin glargine 100 UNIT/ML injection Commonly known as:  LANTUS Inject 0.25 mLs (25 Units total) into the skin at bedtime.   levocetirizine 5 MG tablet Commonly known as:  XYZAL Take 5 mg by mouth every evening.   lisinopril 20 MG tablet Commonly known as:  PRINIVIL,ZESTRIL Take 20 mg by mouth 2 (two) times daily.   LORazepam 0.5 MG tablet Commonly known as:  ATIVAN Take 1 tablet (0.5 mg total) by mouth every 8 (eight) hours as needed for anxiety.   metoprolol 50 MG tablet Commonly known as:  LOPRESSOR Take 50 mg by mouth 2 (two) times daily.   omeprazole 20 MG capsule Commonly known as:  PRILOSEC Take 20 mg by mouth daily.   pioglitazone 15 MG tablet Commonly known as:  ACTOS Take 15 mg by mouth daily.   potassium chloride 10 MEQ tablet Commonly known as:  K-DUR Take 10 mEq by mouth daily.   predniSONE 5 MG tablet Commonly known as:  DELTASONE Take 2.5 mg by mouth daily.       Contact information for follow-up providers    Jules Husbands, MD. Go on 07/29/2016.   Specialty:  General Surgery Why:  Thursday at 10:00am for hospital follow-up Contact information: [place] [address] [phone]            Contact information for after-discharge care    Destination    [facility] .   Specialty:  [place] information: [address] [place]                   Caroleen Hamman, MD [name]

## 2016-07-29 ENCOUNTER — Encounter: Payer: Self-pay | Admitting: Surgery

## 2016-07-29 ENCOUNTER — Ambulatory Visit (INDEPENDENT_AMBULATORY_CARE_PROVIDER_SITE_OTHER): Payer: Commercial Managed Care - HMO | Admitting: Surgery

## 2016-07-29 VITALS — BP 148/74 | HR 84 | Temp 98.2°F | Ht 66.0 in | Wt 219.0 lb

## 2016-07-29 DIAGNOSIS — Z09 Encounter for follow-up examination after completed treatment for conditions other than malignant neoplasm: Secondary | ICD-10-CM

## 2016-07-29 NOTE — Progress Notes (Signed)
S/p ileostomy takedown by Dr. Pat Patrick 9/21. Doing very well. Drain w some fat necrosis Tolerating PO, ambulating No diarrhea   PE NAD Abd: soft, incisions healing well, staples removed. JP restitched . Some fat necrosis, significant improvement as compared to last week. NO infection  A/P doing well Keep retention and JP No complications RTC 2 weeks

## 2016-07-29 NOTE — Patient Instructions (Signed)
Please follow-up in 2 weeks as scheduled below.  Your Steri-strips will fall off in 10-14 days. You may shower with soap and water beginning today. Please avoid using any lotions or creams to your abdomen area.   Please call with any questions or concerns that you may have prior to your appointment.

## 2016-08-03 ENCOUNTER — Telehealth: Payer: Self-pay

## 2016-08-03 NOTE — Telephone Encounter (Signed)
There is only one stitch holding her JP drain in. They are trying to anchor that so it doesn't come out. Should she come in before her appointment? Her appointment is on the 08/12/2016.   She empties about 10 CC twice a day from the drain. Yellow creamy color is the color of the drainage coming out from the bulb. Everything else looks fine. The nurse does not think she has an infection.

## 2016-08-03 NOTE — Telephone Encounter (Addendum)
Spoke with Home Health Nurse at this time. She states that the site looks great, patient is afebrile and not tachycardic. She just wanted [place] to know that patient's drain has been free hanging until she came out today. She did do education with her but does not know if patient will continue to keep the drain from hanging just in case it comes out.   She will see the patient back on Friday and will call with any problems or concerns.

## 2016-08-12 ENCOUNTER — Ambulatory Visit (INDEPENDENT_AMBULATORY_CARE_PROVIDER_SITE_OTHER): Payer: Commercial Managed Care - HMO | Admitting: Surgery

## 2016-08-12 ENCOUNTER — Encounter: Payer: Self-pay | Admitting: Surgery

## 2016-08-12 VITALS — BP 175/75 | HR 79 | Temp 98.1°F | Ht 66.0 in | Wt 216.0 lb

## 2016-08-12 DIAGNOSIS — Z09 Encounter for follow-up examination after completed treatment for conditions other than malignant neoplasm: Secondary | ICD-10-CM

## 2016-08-12 NOTE — Patient Instructions (Signed)

## 2016-08-12 NOTE — Progress Notes (Signed)
S/p ileostomy takedown 9/21 Dr. Pat Patrick Doing well Minimal JP output. No complaints Taking PO, no fevers, + BM  PE NAD JP removed, incision healing well, retention sutures in place w some erosion into the skin but no infection or abscess  A/P Doing well JP removed F/U 2 weeks w Dr. Adonis Huguenin for removal of retention sutures

## 2016-08-23 ENCOUNTER — Emergency Department (HOSPITAL_COMMUNITY): Payer: Commercial Managed Care - HMO

## 2016-08-23 ENCOUNTER — Inpatient Hospital Stay (HOSPITAL_COMMUNITY): Payer: Commercial Managed Care - HMO

## 2016-08-23 ENCOUNTER — Encounter (HOSPITAL_COMMUNITY): Payer: Self-pay | Admitting: *Deleted

## 2016-08-23 ENCOUNTER — Inpatient Hospital Stay (HOSPITAL_COMMUNITY)
Admission: EM | Admit: 2016-08-23 | Discharge: 2016-09-24 | DRG: 296 | Disposition: E | Payer: Commercial Managed Care - HMO | Attending: Internal Medicine | Admitting: Internal Medicine

## 2016-08-23 DIAGNOSIS — Z8709 Personal history of other diseases of the respiratory system: Secondary | ICD-10-CM | POA: Insufficient documentation

## 2016-08-23 DIAGNOSIS — Z87891 Personal history of nicotine dependence: Secondary | ICD-10-CM

## 2016-08-23 DIAGNOSIS — Z66 Do not resuscitate: Secondary | ICD-10-CM | POA: Diagnosis not present

## 2016-08-23 DIAGNOSIS — J9600 Acute respiratory failure, unspecified whether with hypoxia or hypercapnia: Secondary | ICD-10-CM

## 2016-08-23 DIAGNOSIS — Z9289 Personal history of other medical treatment: Secondary | ICD-10-CM

## 2016-08-23 DIAGNOSIS — R4182 Altered mental status, unspecified: Secondary | ICD-10-CM

## 2016-08-23 DIAGNOSIS — L88 Pyoderma gangrenosum: Secondary | ICD-10-CM | POA: Diagnosis present

## 2016-08-23 DIAGNOSIS — R0902 Hypoxemia: Secondary | ICD-10-CM

## 2016-08-23 DIAGNOSIS — S30811A Abrasion of abdominal wall, initial encounter: Secondary | ICD-10-CM

## 2016-08-23 DIAGNOSIS — E872 Acidosis, unspecified: Secondary | ICD-10-CM | POA: Insufficient documentation

## 2016-08-23 DIAGNOSIS — Z853 Personal history of malignant neoplasm of breast: Secondary | ICD-10-CM | POA: Diagnosis not present

## 2016-08-23 DIAGNOSIS — E1122 Type 2 diabetes mellitus with diabetic chronic kidney disease: Secondary | ICD-10-CM | POA: Diagnosis present

## 2016-08-23 DIAGNOSIS — E785 Hyperlipidemia, unspecified: Secondary | ICD-10-CM | POA: Diagnosis present

## 2016-08-23 DIAGNOSIS — I130 Hypertensive heart and chronic kidney disease with heart failure and stage 1 through stage 4 chronic kidney disease, or unspecified chronic kidney disease: Secondary | ICD-10-CM | POA: Diagnosis present

## 2016-08-23 DIAGNOSIS — G253 Myoclonus: Secondary | ICD-10-CM | POA: Diagnosis present

## 2016-08-23 DIAGNOSIS — Z515 Encounter for palliative care: Secondary | ICD-10-CM | POA: Diagnosis not present

## 2016-08-23 DIAGNOSIS — E2740 Unspecified adrenocortical insufficiency: Secondary | ICD-10-CM | POA: Diagnosis present

## 2016-08-23 DIAGNOSIS — N179 Acute kidney failure, unspecified: Secondary | ICD-10-CM | POA: Diagnosis present

## 2016-08-23 DIAGNOSIS — J690 Pneumonitis due to inhalation of food and vomit: Secondary | ICD-10-CM | POA: Diagnosis present

## 2016-08-23 DIAGNOSIS — M316 Other giant cell arteritis: Secondary | ICD-10-CM | POA: Diagnosis present

## 2016-08-23 DIAGNOSIS — D6489 Other specified anemias: Secondary | ICD-10-CM | POA: Diagnosis present

## 2016-08-23 DIAGNOSIS — I509 Heart failure, unspecified: Secondary | ICD-10-CM | POA: Diagnosis present

## 2016-08-23 DIAGNOSIS — R092 Respiratory arrest: Secondary | ICD-10-CM | POA: Insufficient documentation

## 2016-08-23 DIAGNOSIS — I469 Cardiac arrest, cause unspecified: Secondary | ICD-10-CM | POA: Diagnosis present

## 2016-08-23 DIAGNOSIS — I2510 Atherosclerotic heart disease of native coronary artery without angina pectoris: Secondary | ICD-10-CM | POA: Diagnosis present

## 2016-08-23 DIAGNOSIS — G40901 Epilepsy, unspecified, not intractable, with status epilepticus: Secondary | ICD-10-CM | POA: Diagnosis present

## 2016-08-23 DIAGNOSIS — R0602 Shortness of breath: Secondary | ICD-10-CM | POA: Insufficient documentation

## 2016-08-23 DIAGNOSIS — Z79899 Other long term (current) drug therapy: Secondary | ICD-10-CM | POA: Diagnosis not present

## 2016-08-23 DIAGNOSIS — N184 Chronic kidney disease, stage 4 (severe): Secondary | ICD-10-CM | POA: Diagnosis present

## 2016-08-23 DIAGNOSIS — L03311 Cellulitis of abdominal wall: Secondary | ICD-10-CM | POA: Diagnosis present

## 2016-08-23 DIAGNOSIS — J9690 Respiratory failure, unspecified, unspecified whether with hypoxia or hypercapnia: Secondary | ICD-10-CM

## 2016-08-23 DIAGNOSIS — Z7952 Long term (current) use of systemic steroids: Secondary | ICD-10-CM

## 2016-08-23 DIAGNOSIS — Z7189 Other specified counseling: Secondary | ICD-10-CM | POA: Diagnosis not present

## 2016-08-23 DIAGNOSIS — Z794 Long term (current) use of insulin: Secondary | ICD-10-CM

## 2016-08-23 DIAGNOSIS — J9601 Acute respiratory failure with hypoxia: Secondary | ICD-10-CM | POA: Diagnosis not present

## 2016-08-23 DIAGNOSIS — M069 Rheumatoid arthritis, unspecified: Secondary | ICD-10-CM | POA: Diagnosis present

## 2016-08-23 DIAGNOSIS — G931 Anoxic brain damage, not elsewhere classified: Secondary | ICD-10-CM | POA: Diagnosis present

## 2016-08-23 DIAGNOSIS — J80 Acute respiratory distress syndrome: Secondary | ICD-10-CM | POA: Diagnosis present

## 2016-08-23 DIAGNOSIS — Z7982 Long term (current) use of aspirin: Secondary | ICD-10-CM

## 2016-08-23 DIAGNOSIS — L02211 Cutaneous abscess of abdominal wall: Secondary | ICD-10-CM | POA: Diagnosis present

## 2016-08-23 DIAGNOSIS — R001 Bradycardia, unspecified: Secondary | ICD-10-CM | POA: Diagnosis present

## 2016-08-23 DIAGNOSIS — L089 Local infection of the skin and subcutaneous tissue, unspecified: Secondary | ICD-10-CM

## 2016-08-23 HISTORY — DX: Malignant (primary) neoplasm, unspecified: C80.1

## 2016-08-23 LAB — GLUCOSE, CAPILLARY
GLUCOSE-CAPILLARY: 102 mg/dL — AB (ref 65–99)
GLUCOSE-CAPILLARY: 121 mg/dL — AB (ref 65–99)
GLUCOSE-CAPILLARY: 157 mg/dL — AB (ref 65–99)
GLUCOSE-CAPILLARY: 341 mg/dL — AB (ref 65–99)
GLUCOSE-CAPILLARY: 369 mg/dL — AB (ref 65–99)
GLUCOSE-CAPILLARY: 403 mg/dL — AB (ref 65–99)
GLUCOSE-CAPILLARY: 446 mg/dL — AB (ref 65–99)
GLUCOSE-CAPILLARY: 95 mg/dL (ref 65–99)
Glucose-Capillary: 310 mg/dL — ABNORMAL HIGH (ref 65–99)
Glucose-Capillary: 244 mg/dL — ABNORMAL HIGH (ref 65–99)
Glucose-Capillary: 195 mg/dL — ABNORMAL HIGH (ref 65–99)
Glucose-Capillary: 89 mg/dL (ref 65–99)
Glucose-Capillary: 86 mg/dL (ref 65–99)

## 2016-08-23 LAB — CBC WITH DIFFERENTIAL/PLATELET
BASOS ABS: 0.1 10*3/uL (ref 0.0–0.1)
BASOS PCT: 0 %
EOS ABS: 0.3 10*3/uL (ref 0.0–0.7)
EOS PCT: 1 %
HCT: 30.4 % — ABNORMAL LOW (ref 36.0–46.0)
Hemoglobin: 9.3 g/dL — ABNORMAL LOW (ref 12.0–15.0)
LYMPHS PCT: 23 %
Lymphs Abs: 4.5 10*3/uL — ABNORMAL HIGH (ref 0.7–4.0)
MCH: 27.8 pg (ref 26.0–34.0)
MCHC: 30.6 g/dL (ref 30.0–36.0)
MCV: 90.7 fL (ref 78.0–100.0)
MONO ABS: 0.9 10*3/uL (ref 0.1–1.0)
Monocytes Relative: 5 %
Neutro Abs: 13.7 10*3/uL — ABNORMAL HIGH (ref 1.7–7.7)
Neutrophils Relative %: 71 %
PLATELETS: 307 10*3/uL (ref 150–400)
RBC: 3.35 MIL/uL — ABNORMAL LOW (ref 3.87–5.11)
RDW: 15.1 % (ref 11.5–15.5)
WBC: 19.4 10*3/uL — ABNORMAL HIGH (ref 4.0–10.5)

## 2016-08-23 LAB — BLOOD GAS, ARTERIAL
ACID-BASE DEFICIT: 7.5 mmol/L — AB (ref 0.0–2.0)
Bicarbonate: 18.2 mmol/L — ABNORMAL LOW (ref 20.0–28.0)
DRAWN BY: 257881
FIO2: 1
MECHVT: 360 mL
O2 Saturation: 99.5 %
PATIENT TEMPERATURE: 84.2
PCO2 ART: 28.4 mmHg — AB (ref 32.0–48.0)
PEEP: 10 cmH2O
PH ART: 7.372 (ref 7.350–7.450)
PO2 ART: 230 mmHg — AB (ref 83.0–108.0)
RATE: 30 resp/min

## 2016-08-23 LAB — COMPREHENSIVE METABOLIC PANEL
ALK PHOS: 98 U/L (ref 38–126)
ALT: 25 U/L (ref 14–54)
ANION GAP: 16 — AB (ref 5–15)
AST: 71 U/L — ABNORMAL HIGH (ref 15–41)
Albumin: 3.1 g/dL — ABNORMAL LOW (ref 3.5–5.0)
BILIRUBIN TOTAL: 0.6 mg/dL (ref 0.3–1.2)
BUN: 27 mg/dL — ABNORMAL HIGH (ref 6–20)
CALCIUM: 8.4 mg/dL — AB (ref 8.9–10.3)
CO2: 14 mmol/L — ABNORMAL LOW (ref 22–32)
Chloride: 109 mmol/L (ref 101–111)
Creatinine, Ser: 2.29 mg/dL — ABNORMAL HIGH (ref 0.44–1.00)
GFR calc non Af Amer: 20 mL/min — ABNORMAL LOW (ref >60)
GFR, EST AFRICAN AMERICAN: 24 mL/min — AB (ref >60)
Glucose, Bld: 369 mg/dL — ABNORMAL HIGH (ref 65–99)
POTASSIUM: 4.7 mmol/L (ref 3.5–5.1)
SODIUM: 139 mmol/L (ref 135–145)
TOTAL PROTEIN: 6.9 g/dL (ref 6.5–8.1)

## 2016-08-23 LAB — I-STAT ARTERIAL BLOOD GAS, ED
ACID-BASE DEFICIT: 10 mmol/L — AB (ref 0.0–2.0)
Acid-base deficit: 7 mmol/L — ABNORMAL HIGH (ref 0.0–2.0)
BICARBONATE: 18.8 mmol/L — AB (ref 20.0–28.0)
Bicarbonate: 19.8 mmol/L — ABNORMAL LOW (ref 20.0–28.0)
O2 SAT: 93 %
O2 Saturation: 95 %
PCO2 ART: 38.5 mmHg (ref 32.0–48.0)
PH ART: 7.295 — AB (ref 7.350–7.450)
PO2 ART: 91 mmHg (ref 83.0–108.0)
Patient temperature: 98.9
Patient temperature: 32.6
TCO2: 20 mmol/L (ref 0–100)
TCO2: 21 mmol/L (ref 0–100)
pCO2 arterial: 56.5 mmHg — ABNORMAL HIGH (ref 32.0–48.0)
pH, Arterial: 7.13 — CL (ref 7.350–7.450)
pO2, Arterial: 65 mmHg — ABNORMAL LOW (ref 83.0–108.0)

## 2016-08-23 LAB — I-STAT CHEM 8, ED
BUN: 34 mg/dL — ABNORMAL HIGH (ref 6–20)
CALCIUM ION: 1.1 mmol/L — AB (ref 1.15–1.40)
CHLORIDE: 111 mmol/L (ref 101–111)
Creatinine, Ser: 2.1 mg/dL — ABNORMAL HIGH (ref 0.44–1.00)
Glucose, Bld: 357 mg/dL — ABNORMAL HIGH (ref 65–99)
HCT: 29 % — ABNORMAL LOW (ref 36.0–46.0)
HEMOGLOBIN: 9.9 g/dL — AB (ref 12.0–15.0)
Potassium: 4.6 mmol/L (ref 3.5–5.1)
SODIUM: 140 mmol/L (ref 135–145)
TCO2: 17 mmol/L (ref 0–100)

## 2016-08-23 LAB — URINALYSIS, ROUTINE W REFLEX MICROSCOPIC
Bilirubin Urine: NEGATIVE
Glucose, UA: >1000 mg/dL — AB
KETONES UR: NEGATIVE mg/dL
LEUKOCYTES UA: NEGATIVE
NITRITE: NEGATIVE
PROTEIN: 100 mg/dL — AB
Specific Gravity, Urine: 1.012 (ref 1.005–1.030)
pH: 6 (ref 5.0–8.0)

## 2016-08-23 LAB — URINE MICROSCOPIC-ADD ON

## 2016-08-23 LAB — I-STAT TROPONIN, ED: TROPONIN I, POC: 0.03 ng/mL (ref 0.00–0.08)

## 2016-08-23 LAB — LACTIC ACID, PLASMA: LACTIC ACID, VENOUS: 4.3 mmol/L — AB (ref 0.5–1.9)

## 2016-08-23 LAB — I-STAT CG4 LACTIC ACID, ED
LACTIC ACID, VENOUS: 8.55 mmol/L — AB (ref 0.5–1.9)
LACTIC ACID, VENOUS: 4.07 mmol/L — AB (ref 0.5–1.9)

## 2016-08-23 LAB — MAGNESIUM: MAGNESIUM: 1.7 mg/dL (ref 1.7–2.4)

## 2016-08-23 LAB — TROPONIN I: TROPONIN I: 0.09 ng/mL — AB (ref <0.03)

## 2016-08-23 LAB — MRSA PCR SCREENING: MRSA by PCR: NEGATIVE

## 2016-08-23 LAB — PROCALCITONIN: Procalcitonin: 0.47 ng/mL

## 2016-08-23 MED ORDER — ALBUTEROL SULFATE (2.5 MG/3ML) 0.083% IN NEBU: 2.5000 mg | INHALATION_SOLUTION | RESPIRATORY_TRACT | Status: DC | PRN

## 2016-08-23 MED ORDER — SODIUM CHLORIDE 0.9 % IV SOLN
100.0000 mg | INTRAVENOUS | Status: DC
  Administered 2016-08-24 – 2016-08-25 (×2): 100 mg via INTRAVENOUS
  Filled 2016-08-23 (×3): qty 100

## 2016-08-23 MED ORDER — SODIUM CHLORIDE 0.9 % IV SOLN
500.0000 mg | Freq: Once | INTRAVENOUS | Status: AC
  Administered 2016-08-23: 500 mg via INTRAVENOUS
  Filled 2016-08-23: qty 500

## 2016-08-23 MED ORDER — FENTANYL BOLUS VIA INFUSION
50.0000 ug | INTRAVENOUS | Status: DC | PRN
Start: 2016-08-23 — End: 2016-08-26
  Administered 2016-08-23: 50 ug via INTRAVENOUS
  Filled 2016-08-23: qty 50

## 2016-08-23 MED ORDER — ORAL CARE MOUTH RINSE
15.0000 mL | OROMUCOSAL | Status: DC
  Administered 2016-08-23 – 2016-08-31 (×77): 15 mL via OROMUCOSAL

## 2016-08-23 MED ORDER — TECHNETIUM TO 99M ALBUMIN AGGREGATED
3.1000 | Freq: Once | INTRAVENOUS | Status: AC | PRN
  Administered 2016-08-23: 3.1 via INTRAVENOUS

## 2016-08-23 MED ORDER — VANCOMYCIN HCL IN DEXTROSE 1-5 GM/200ML-% IV SOLN
1000.0000 mg | Freq: Once | INTRAVENOUS | Status: AC
  Administered 2016-08-23: 1000 mg via INTRAVENOUS
  Filled 2016-08-23: qty 200

## 2016-08-23 MED ORDER — SODIUM CHLORIDE 0.9 % IV SOLN
1.0000 g | Freq: Two times a day (BID) | INTRAVENOUS | Status: AC
  Administered 2016-08-23 – 2016-08-29 (×13): 1 g via INTRAVENOUS
  Filled 2016-08-23 (×13): qty 1

## 2016-08-23 MED ORDER — ANIDULAFUNGIN 100 MG IV SOLR
200.0000 mg | Freq: Once | INTRAVENOUS | Status: AC
  Administered 2016-08-23: 200 mg via INTRAVENOUS
  Filled 2016-08-23 (×2): qty 200

## 2016-08-23 MED ORDER — MIDAZOLAM HCL 2 MG/2ML IJ SOLN
2.0000 mg | INTRAMUSCULAR | Status: DC | PRN
  Administered 2016-08-24 – 2016-08-29 (×2): 2 mg via INTRAVENOUS
  Filled 2016-08-23 (×2): qty 2

## 2016-08-23 MED ORDER — INSULIN ASPART 100 UNIT/ML ~~LOC~~ SOLN: 2.0000 [IU] | SUBCUTANEOUS | Status: DC

## 2016-08-23 MED ORDER — SODIUM CHLORIDE 0.9 % IV SOLN
INTRAVENOUS | Status: DC
  Administered 2016-08-23 – 2016-08-25 (×2): via INTRAVENOUS

## 2016-08-23 MED ORDER — ETOMIDATE 2 MG/ML IV SOLN
INTRAVENOUS | Status: DC | PRN
  Administered 2016-08-23: 20 mg via INTRAVENOUS

## 2016-08-23 MED ORDER — ROCURONIUM BROMIDE 50 MG/5ML IV SOLN
INTRAVENOUS | Status: DC | PRN
  Administered 2016-08-23: 100 mg via INTRAVENOUS

## 2016-08-23 MED ORDER — FENTANYL CITRATE (PF) 100 MCG/2ML IJ SOLN
50.0000 ug | Freq: Once | INTRAMUSCULAR | Status: DC
  Filled 2016-08-23: qty 2

## 2016-08-23 MED ORDER — FENTANYL 2500MCG IN NS 250ML (10MCG/ML) PREMIX INFUSION
25.0000 ug/h | INTRAVENOUS | Status: DC
  Administered 2016-08-23: 25 ug/h via INTRAVENOUS
  Administered 2016-08-24: 50 ug/h via INTRAVENOUS
  Administered 2016-08-26: 75 ug/h via INTRAVENOUS
  Administered 2016-08-26 – 2016-08-27 (×2): 100 ug/h via INTRAVENOUS
  Filled 2016-08-23 (×5): qty 250

## 2016-08-23 MED ORDER — VANCOMYCIN HCL IN DEXTROSE 1-5 GM/200ML-% IV SOLN
1000.0000 mg | INTRAVENOUS | Status: DC
  Administered 2016-08-24 – 2016-08-25 (×2): 1000 mg via INTRAVENOUS
  Filled 2016-08-23 (×2): qty 200

## 2016-08-23 MED ORDER — FAMOTIDINE IN NACL 20-0.9 MG/50ML-% IV SOLN
20.0000 mg | Freq: Two times a day (BID) | INTRAVENOUS | Status: DC
  Administered 2016-08-23 – 2016-08-26 (×7): 20 mg via INTRAVENOUS
  Filled 2016-08-23 (×7): qty 50

## 2016-08-23 MED ORDER — SODIUM CHLORIDE 0.9 % IV SOLN
INTRAVENOUS | Status: DC
  Administered 2016-08-23: 3.9 [IU]/h via INTRAVENOUS
  Filled 2016-08-23: qty 2.5

## 2016-08-23 MED ORDER — HEPARIN SODIUM (PORCINE) 5000 UNIT/ML IJ SOLN
5000.0000 [IU] | Freq: Three times a day (TID) | INTRAMUSCULAR | Status: DC
  Administered 2016-08-23 – 2016-08-31 (×23): 5000 [IU] via SUBCUTANEOUS
  Filled 2016-08-23 (×24): qty 1

## 2016-08-23 MED ORDER — CHLORHEXIDINE GLUCONATE 0.12% ORAL RINSE (MEDLINE KIT)
15.0000 mL | Freq: Two times a day (BID) | OROMUCOSAL | Status: DC
  Administered 2016-08-23 – 2016-08-31 (×17): 15 mL via OROMUCOSAL

## 2016-08-23 MED ORDER — HYDROCORTISONE NA SUCCINATE PF 100 MG IJ SOLR
50.0000 mg | Freq: Four times a day (QID) | INTRAMUSCULAR | Status: DC
  Administered 2016-08-23 – 2016-08-25 (×8): 50 mg via INTRAVENOUS
  Filled 2016-08-23 (×8): qty 2

## 2016-08-23 MED ORDER — MIDAZOLAM HCL 2 MG/2ML IJ SOLN: 2.0000 mg | INTRAMUSCULAR | Status: DC | PRN

## 2016-08-23 NOTE — Progress Notes (Signed)
[facility] Progress Note Patient Name: [name] DOB: 1945-12-12 MRN: [id number]   Date of Service  08/20/2016  HPI/Events of Note  Pt had EEG earlier today, and family requesting results.   eICU Interventions  Spoke with pt's daughter.  Explained EEG results, and that findings are concerning for significant neurologic injury.  Explained that she is on appropriate supportive therapy.  Explained that her neuro status will be reassessed in the morning, and then family will be updated further.      Intervention Category Major Interventions: Other:  Hansini Clodfelter 08/08/2016, 6:38 PM

## 2016-08-23 NOTE — ED Notes (Signed)
Critical care MD here to see patient

## 2016-08-23 NOTE — Consult Note (Addendum)
Reason for Consult:  Possible intra-abdominal infection Referring Physician: [name] is an 70 y.o. female.  HPI: This patient was admitted earlier this morning by CCM after PEA arrest at home with 30-40 minutes of CPR.  She has been hemodynamically stable without the need for pressors.  She is unresponsive without purposeful movements.  The etiology of her cardiac arrest is unclear at this time.  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 Bronson Ing. The patient had a prolonged postoperative course and was discharged to a skilled nursing facility 2 weeks after surgery. She was readmitted to the hospital on 07/11/16 with renal failure and dehydration. Her rectal anastomosis was studied and it showed no sign of leak. She underwent ileostomy reversal on 07/15/16 through an elliptical incision around her old ileostomy.  Stay sutures were placed in the skin and subcutaneous tissues and a JP drain was in the subcutaneous space. The last office visit by Dr. Dahlia Byes was on 08/12/16.  The drain was removed.  Past Medical History:  Diagnosis Date  . Arthritis    Inflammatory  . Cancer ([place])   . CHF (congestive heart failure) ([place])   . Chronic renal insufficiency February 07, 2015  . Coronary artery dilation   . 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 Diverticulitis   . Hyperlipidemia   . Hypertension   . Renal insufficiency   . Sinus problem   . Thyroid disease   . Ulcer of left lower leg [facility]) February 07, 2015    Past Surgical History:  Procedure Laterality Date  . ABDOMINAL HYSTERECTOMY    . CATARACT EXTRACTION W/PHACO Right 03/11/2015   Procedure: CATARACT EXTRACTION PHACO AND INTRAOCULAR LENS PLACEMENT (IOC);  Surgeon: Birder Robson, MD;  Location: [facility] ORS;  Service: Ophthalmology;  Laterality: Right;  [place] 00:40 AP% 24.3 CDE 9.86  . CENTRAL VENOUS CATHETER  INSERTION Right 06/09/2016   Procedure: INSERTION CENTRAL LINE ADULT;  Surgeon: Dia Crawford III, MD;  Location: [facility] ORS;  Service: General;  Laterality: Right;  . COLON RESECTION SIGMOID N/A 06/09/2016   Procedure: COLON RESECTION SIGMOID;  Surgeon: Dia Crawford III, MD;  Location: [facility] ORS;  Service: General;  Laterality: N/A;  . COLOSTOMY N/A 06/09/2016   Procedure: COLOSTOMY;  Surgeon: Dia Crawford III, MD;  Location: [facility] ORS;  Service: General;  Laterality: N/A;  . CYST EXCISION    . DILATION AND CURETTAGE OF UTERUS  1984  . I&D EXTREMITY Right 09/04/2015   Procedure: IRRIGATION AND DEBRIDEMENT EXTREMITY/ AND BIOPSY;  Surgeon: Algernon Huxley, MD;  Location: [facility] ORS;  Service: Vascular;  Laterality: Right;  . ILEOSTOMY CLOSURE N/A 07/15/2016   Procedure: ILEOSTOMY TAKEDOWN;  Surgeon: Dia Crawford III, MD;  Location: [facility] ORS;  Service: General;  Laterality: N/A;  . PARTIAL HYSTERECTOMY  1985  . ROTATOR CUFF REPAIR  2004    Family History  Problem Relation Age of Onset  . Diabetes Mellitus II Mother   . Diabetes Mellitus II Father     Social History:  reports that she has quit smoking. She quit after 45.00 years of use. She has never used smokeless tobacco. She reports that she does not drink alcohol or use drugs.  Allergies:  Allergies  Allergen Reactions  . Doxycycline Other (See Comments)    Reaction: unknown  . Levaquin [Levofloxacin] Other (See Comments)    Causes joint pain  . Penicillins  Rash and Other (See Comments)    Has patient had a PCN reaction causing immediate rash, facial/tongue/throat swelling, SOB or lightheadedness with hypotension: Yes Has patient had a PCN reaction causing severe rash involving mucus membranes or skin necrosis: No Has patient had a PCN reaction that required hospitalization No Has patient had a PCN reaction occurring within the last 10 years: No If all of the above answers are "NO", then may proceed with Cephalosporin use.  . Sulfa Antibiotics Swelling  and Rash    Medications:  Prior to Admission medications   Medication Sig Start Date End Date Taking? Authorizing Provider  acetaminophen (TYLENOL) 500 MG tablet Take 500 mg by mouth every 6 (six) hours as needed for mild pain, fever or headache.     Historical Provider, MD  amLODipine (NORVASC) 10 MG tablet Take 10 mg by mouth daily.    Historical Provider, MD  aspirin 81 MG tablet Take 81 mg by mouth 2 (two) times daily.     Historical Provider, MD  atorvastatin (LIPITOR) 40 MG tablet Take 40 mg by mouth daily.    Historical Provider, MD  furosemide (LASIX) 20 MG tablet Take 40 mg by mouth daily.     Historical Provider, MD  gabapentin (NEURONTIN) 100 MG capsule Take 100 mg by mouth 3 (three) times daily.    Historical Provider, MD  hydrALAZINE (APRESOLINE) 25 MG tablet Take 25 mg by mouth 2 (two) times daily. Take another 91QX if Systolic consistently over 450 or dystolic over 90    Historical Provider, MD  hydrochlorothiazide (HYDRODIURIL) 25 MG tablet Take 25 mg by mouth daily.    Historical Provider, MD  ibuprofen (ADVIL,MOTRIN) 800 MG tablet Take 1 tablet (800 mg total) by mouth every 8 (eight) hours as needed. 06/22/16   Hubbard Robinson, MD  insulin aspart (NOVOLOG) 100 UNIT/ML injection Inject 12 Units into the skin 3 (three) times daily.     Historical Provider, MD  insulin glargine (LANTUS) 100 UNIT/ML injection Inject 0.25 mLs (25 Units total) into the skin at bedtime. 11/10/15   Nicholes Mango, MD  levocetirizine (XYZAL) 5 MG tablet Take 5 mg by mouth every evening.    Historical Provider, MD  lisinopril (PRINIVIL,ZESTRIL) 20 MG tablet Take 20 mg by mouth 2 (two) times daily.     Historical Provider, MD  LORazepam (ATIVAN) 0.5 MG tablet Take 1 tablet (0.5 mg total) by mouth every 8 (eight) hours as needed for anxiety. 02/17/16 02/16/17  Earleen Newport, MD  metoprolol (LOPRESSOR) 50 MG tablet Take 50 mg by mouth 2 (two) times daily.    Historical Provider, MD  omeprazole (PRILOSEC)  20 MG capsule Take 20 mg by mouth daily.    Historical Provider, MD  pioglitazone (ACTOS) 15 MG tablet Take 15 mg by mouth daily.    Historical Provider, MD  potassium chloride (K-DUR) 10 MEQ tablet Take 10 mEq by mouth daily.    Historical Provider, MD  predniSONE (DELTASONE) 5 MG tablet Take 2.5 mg by mouth daily.     Historical Provider, MD     Results for orders placed or performed during the hospital encounter of 08/16/2016 (from the past 48 hour(s))  Comprehensive metabolic panel     Status: Abnormal   Collection Time: 08/04/2016  3:50 AM  Result Value Ref Range   Sodium 139 135 - 145 mmol/L   Potassium 4.7 3.5 - 5.1 mmol/L   Chloride 109 101 - 111 mmol/L   CO2 14 (L) 22 -  32 mmol/L   Glucose, Bld 369 (H) 65 - 99 mg/dL   BUN 27 (H) 6 - 20 mg/dL   Creatinine, Ser 2.29 (H) 0.44 - 1.00 mg/dL   Calcium 8.4 (L) 8.9 - 10.3 mg/dL   Total Protein 6.9 6.5 - 8.1 g/dL   Albumin 3.1 (L) 3.5 - 5.0 g/dL   AST 71 (H) 15 - 41 U/L   ALT 25 14 - 54 U/L   Alkaline Phosphatase 98 38 - 126 U/L   Total Bilirubin 0.6 0.3 - 1.2 mg/dL   GFR calc non Af Amer 20 (L) >60 mL/min   GFR calc Af Amer 24 (L) >60 mL/min    Comment: (NOTE) The eGFR has been calculated using the CKD EPI equation. This calculation has not been validated in all clinical situations. eGFR's persistently <60 mL/min signify possible Chronic Kidney Disease.    Anion gap 16 (H) 5 - 15  Magnesium     Status: None   Collection Time: 08/16/2016  3:50 AM  Result Value Ref Range   Magnesium 1.7 1.7 - 2.4 mg/dL  CBC with Differential/Platelet     Status: Abnormal   Collection Time: 08/14/2016  3:50 AM  Result Value Ref Range   WBC 19.4 (H) 4.0 - 10.5 K/uL   RBC 3.35 (L) 3.87 - 5.11 MIL/uL   Hemoglobin 9.3 (L) 12.0 - 15.0 g/dL   HCT 30.4 (L) 36.0 - 46.0 %   MCV 90.7 78.0 - 100.0 fL   MCH 27.8 26.0 - 34.0 pg   MCHC 30.6 30.0 - 36.0 g/dL   RDW 15.1 11.5 - 15.5 %   Platelets 307 150 - 400 K/uL   Neutrophils Relative % 71 %   Neutro Abs  13.7 (H) 1.7 - 7.7 K/uL   Lymphocytes Relative 23 %   Lymphs Abs 4.5 (H) 0.7 - 4.0 K/uL   Monocytes Relative 5 %   Monocytes Absolute 0.9 0.1 - 1.0 K/uL   Eosinophils Relative 1 %   Eosinophils Absolute 0.3 0.0 - 0.7 K/uL   Basophils Relative 0 %   Basophils Absolute 0.1 0.0 - 0.1 K/uL  Troponin I     Status: Abnormal   Collection Time: 08/04/2016  3:50 AM  Result Value Ref Range   Troponin I 0.09 (HH) <0.03 ng/mL    Comment: CRITICAL RESULT CALLED TO, READ BACK BY AND VERIFIED WITH: JACOBELIB,RN 08/04/2016 0443 WAYK   I-stat troponin, ED     Status: None   Collection Time: 08/19/2016  3:57 AM  Result Value Ref Range   Troponin i, poc 0.03 0.00 - 0.08 ng/mL   Comment 3            Comment: Due to the release kinetics of cTnI, a negative result within the first hours of the onset of symptoms does not rule out myocardial infarction with certainty. If myocardial infarction is still suspected, repeat the test at appropriate intervals.   I-stat chem 8, ed     Status: Abnormal   Collection Time: 08/04/2016  3:59 AM  Result Value Ref Range   Sodium 140 135 - 145 mmol/L   Potassium 4.6 3.5 - 5.1 mmol/L   Chloride 111 101 - 111 mmol/L   BUN 34 (H) 6 - 20 mg/dL   Creatinine, Ser 2.10 (H) 0.44 - 1.00 mg/dL   Glucose, Bld 357 (H) 65 - 99 mg/dL   Calcium, Ion 1.10 (L) 1.15 - 1.40 mmol/L   TCO2 17 0 - 100 mmol/L  Hemoglobin 9.9 (L) 12.0 - 15.0 g/dL   HCT 29.0 (L) 36.0 - 46.0 %  I-Stat CG4 Lactic Acid, ED     Status: Abnormal   Collection Time: 08/07/2016  4:00 AM  Result Value Ref Range   Lactic Acid, Venous 8.55 (HH) 0.5 - 1.9 mmol/L   Comment NOTIFIED PHYSICIAN   Urinalysis, Routine w reflex microscopic (not at [facility])     Status: Abnormal   Collection Time: 08/21/2016  4:27 AM  Result Value Ref Range   Color, Urine YELLOW YELLOW   APPearance CLOUDY (A) CLEAR   Specific Gravity, Urine 1.012 1.005 - 1.030   pH 6.0 5.0 - 8.0   Glucose, UA >1000 (A) NEGATIVE mg/dL   Hgb urine dipstick  MODERATE (A) NEGATIVE   Bilirubin Urine NEGATIVE NEGATIVE   Ketones, ur NEGATIVE NEGATIVE mg/dL   Protein, ur 100 (A) NEGATIVE mg/dL   Nitrite NEGATIVE NEGATIVE   Leukocytes, UA NEGATIVE NEGATIVE  Urine microscopic-add on     Status: Abnormal   Collection Time: 08/04/2016  4:27 AM  Result Value Ref Range   Squamous Epithelial / LPF 0-5 (A) NONE SEEN   WBC, UA 6-30 0 - 5 WBC/hpf   RBC / HPF 6-30 0 - 5 RBC/hpf   Bacteria, UA FEW (A) NONE SEEN  I-Stat arterial blood gas, ED     Status: Abnormal   Collection Time: 08/02/2016  4:53 AM  Result Value Ref Range   pH, Arterial 7.130 (LL) 7.350 - 7.450   pCO2 arterial 56.5 (H) 32.0 - 48.0 mmHg   pO2, Arterial 91.0 83.0 - 108.0 mmHg   Bicarbonate 18.8 (L) 20.0 - 28.0 mmol/L   TCO2 20 0 - 100 mmol/L   O2 Saturation 93.0 %   Acid-base deficit 10.0 (H) 0.0 - 2.0 mmol/L   Patient temperature 98.9 F    Collection site RADIAL, ALLEN'S TEST ACCEPTABLE    Drawn by Operator    Sample type ARTERIAL    Comment NOTIFIED PHYSICIAN   I-Stat arterial blood gas, ED     Status: Abnormal   Collection Time: 07/25/2016  6:45 AM  Result Value Ref Range   pH, Arterial 7.295 (L) 7.350 - 7.450   pCO2 arterial 38.5 32.0 - 48.0 mmHg   pO2, Arterial 65.0 (L) 83.0 - 108.0 mmHg   Bicarbonate 19.8 (L) 20.0 - 28.0 mmol/L   TCO2 21 0 - 100 mmol/L   O2 Saturation 95.0 %   Acid-base deficit 7.0 (H) 0.0 - 2.0 mmol/L   Patient temperature 32.6 C    Collection site RADIAL, ALLEN'S TEST ACCEPTABLE    Drawn by Operator    Sample type ARTERIAL   I-Stat CG4 Lactic Acid, ED     Status: Abnormal   Collection Time: 08/22/2016  6:47 AM  Result Value Ref Range   Lactic Acid, Venous 4.07 (HH) 0.5 - 1.9 mmol/L   Comment NOTIFIED PHYSICIAN   Lactic acid, plasma     Status: Abnormal   Collection Time: 08/18/2016  9:15 AM  Result Value Ref Range   Lactic Acid, Venous 4.3 (HH) 0.5 - 1.9 mmol/L    Comment: CRITICAL RESULT CALLED TO, READ BACK BY AND VERIFIED WITH: N.BEASLEY,RN 08/13/2016  [id number] BY BSLADE     Ct Abdomen Pelvis Wo Contrast  Result Date: 08/07/2016 CLINICAL DATA:  Abdominal wall abscess. EXAM: CT ABDOMEN AND PELVIS WITHOUT CONTRAST TECHNIQUE: Multidetector CT imaging of the abdomen and pelvis was performed following the standard protocol without IV contrast. COMPARISON:  06/16/2016  FINDINGS: Lower chest: Dense consolidation of the central lower lobes, right greater than left. This most likely represents pneumonia. Small effusions. Hepatobiliary: No focal liver abnormality is seen. No gallstones, gallbladder wall thickening, or biliary dilatation. Pancreas: Unremarkable. No pancreatic ductal dilatation or surrounding inflammatory changes. Spleen: Normal in size without focal abnormality. Adrenals/Urinary Tract: Adrenal glands are unremarkable. Kidneys are normal, without renal calculi, focal lesion, or hydronephrosis. Bladder is decompressed around a Foley catheter. Stomach/Bowel: Nasogastric tube extends to the stomach. Stomach and small bowel are not obstructed. A loop of small bowel appears to protrude slightly through the right lower quadrant abdominal wall at the location of the prior ostomy. The colon is unobstructed. The sigmoid anastomosis appears unchanged. There is a small focal extension of air beyond the bowel contour at the sigmoid anastomosis corresponding to the focal irregularity observed on 07/14/2016 barium enema; no fistula or drainable abscess is evident at this location. Vascular/Lymphatic: No significant vascular findings are present. No enlarged abdominal or pelvic lymph nodes. Reproductive: Status post hysterectomy. No adnexal masses. Other: Focal extraluminal air in the midline incision just above the level of the umbilicus, likely representing a small abscess measuring less than 2 cm, within the anterior abdominal wall. This is visible on image 65 series 2. There also is subcutaneous inflammatory change in the right lower quadrant at the site of the prior  ostomy; this may represent cellulitis without significant drainable abscess. No ascites.  No free peritoneal air. Musculoskeletal: No acute or significant osseous findings. IMPRESSION: 1. Dense consolidation of the central lower lobes of both lungs, right worse than left. Likely pneumonia. Small pleural effusions bilaterally. 2. Probable small abdominal wall abscess in the midline supraumbilical region measuring less than 2 cm. There also are changes suggesting cellulitis in the subcutaneous fat of the right lower quadrant corresponding to the site of the prior ostomy. 3. No bowel obstruction. Electronically Signed   By: Andreas Newport M.D.   On: 08/16/2016 06:34   Ct Head Wo Contrast  Result Date: 08/05/2016 CLINICAL DATA:  Post cardiac arrest and recent colostomy reversal, history diabetes mellitus, coronary artery disease, hypertension, CHF EXAM: CT HEAD WITHOUT CONTRAST TECHNIQUE: Contiguous axial images were obtained from the base of the skull through the vertex without intravenous contrast. COMPARISON:  None FINDINGS: Brain: Normal ventricular morphology. No midline shift or mass effect. Normal appearance of brain parenchyma. No intracranial hemorrhage, mass lesion, evidence of acute infarction, or extra-axial fluid collection. Vascular: Atherosclerotic calcifications at the carotid siphons bilaterally Skull: Intact Sinuses/Orbits: Minimal mucosal thickening in ethmoid air cells. Tiny osteoma LEFT frontal sinus. Other: N/A IMPRESSION: No acute intracranial abnormalities. Electronically Signed   By: Lavonia Dana M.D.   On: 08/10/2016 08:05   Nm Pulmonary Perfusion  Result Date: 07/27/2016 CLINICAL DATA:  Shortness of breath. EXAM: NUCLEAR MEDICINE PERFUSION SCAN TECHNIQUE: Perfusion images were obtained in multiple projections after intravenous injection of radiopharmaceutical. RADIOPHARMACEUTICALS:  3.1 MCi [id number] IV COMPARISON:  Chest x-ray 08/06/2016. FINDINGS: Perfusion: No wedge shaped  peripheral perfusion defects to suggest acute pulmonary embolism. IMPRESSION: Normal exam. Electronically Signed   By: TMarcello Moores Register   On: 08/14/2016 08:39   Dg Chest Port 1 View  Result Date: 08/21/2016 CLINICAL DATA:  Respiratory failure.  Intubation. EXAM: PORTABLE CHEST 1 VIEW COMPARISON:  06/15/2016 FINDINGS: Endotracheal tube is 3.4 cm above the carina. Nasogastric tube extends into the stomach and beyond the inferior edge of the image. There is dense consolidation in the central lungs bilaterally. No pneumothorax. IMPRESSION:  1. Satisfactorily positioned support equipment. 2. Dense airspace consolidation in the central lungs bilaterally. This could represent alveolar edema, infectious infiltrate, pulmonary hemorrhage. Electronically Signed   By: Andreas Newport M.D.   On: 08/02/2016 04:21    ROS  Unable to obtain due to patient's mental status  Blood pressure (!) 124/56, pulse (!) 57, temperature (!) 90.2 F (32.3 C), temperature source Rectal, resp. rate (!) 30, height _0  (1.676 m), weight 98 kg (216 lb), SpO2 100 %. Physical Exam Difficult to assess because of mental status Obese female - non-responsive Intubated No eye opening, no purposeful movements HEENT - oral mucosa moist; pupils 4 mm sluggish Neck - no masses; no JVD or thyromegaly Lungs - CTA B; not breathing over vent CV - regular rate and rhythm; no murmurs Abd - hypoactive BS; obese; no signs of tenderness Healed midline incision RLQ ileostomy site - two retention sutures with rubber catheters - no erythema or induration Retention sutures removed   Assessment/Plan: I examined the patient as well as the CT scan. The right lower quadrant ileostomy site appears to have some slight fascial separation with herniation of the small bowel anastomosis. However there does not seem to be any sign of bowel obstruction at this time.  The small midline collection of air just behind the umbilicus at the deep surface of the  fascial closure does not appear to have any surrounding stranding as would be expected if this was an abscess. This may represent some slight separation of the fascial closure with bowel in this area. It is possible that this is early abscess but patient is not really a strong candidate for surgery at this time. Would empirically cover with antibiotics. The patient may have some anoxic brain injury from the prolonged CPR.  We will continue to follow along with you but there did not appear to be any acute surgical issues.  Kristin Burn. Georgette Dover, MD, [facility] Surgery  General/ Trauma Surgery  07/25/2016 10:30 AM   Kristin Sole K. 08/11/2016, 10:13 AM    Addendum:  I spoke with [name], his primary surgeon, who was able to review the CT scan.  He agrees with my assessment that there may be a developing hernia/ fascial separation at her ileostomy closure site.  Does not appear that he has an abscess at the umbilicus when you compare to the previous CT scan.  Kristin Burn. Georgette Dover, MD, [facility] Surgery  General/ Trauma Surgery  08/08/2016 2:08 PM

## 2016-08-23 NOTE — H&P (Addendum)
PULMONARY / CRITICAL CARE MEDICINE   Name: [name] MRN: [id number] DOB: 1946-05-09    ADMISSION DATE:  08/08/2016  REFERRING MD:  Randal Buba (EDP)   CHIEF COMPLAINT:  PEA arrest   HISTORY OF PRESENT ILLNESS:   70yo female with hx CHF, DM, HTN, breast cancer with recent ileostomy takedown 07/15/16 presented 10/30 after PEA arrest at home.  She called EMS for sudden onset SOB.  When fire arrived she was apneic and pulseless.  She had ~30-40 mins CPR with epi x 3 prior to ROSC.   In ER she remained bradycardic but hemodynamically stable, did not require pressors.  Remained essentially unresponsive without purposeful movements. PCCM called to admit.   PAST MEDICAL HISTORY :  She  has a past medical history of Arthritis; Cancer [facility]); CHF (congestive heart failure) ([place]); Chronic renal insufficiency (February 07, 2015); Coronary artery dilation; Diabetes mellitus without complication ([place]); Diverticulitis; Hyperlipidemia; Hypertension; Renal insufficiency; Sinus problem; Thyroid disease; and Ulcer of left lower leg [facility]) (February 07, 2015).  PAST SURGICAL HISTORY: She  has a past surgical history that includes Abdominal hysterectomy; Dilation and curettage of uterus (1984); Partial hysterectomy (1985); Rotator cuff repair (2004); Cyst excision; Cataract extraction w/PHACO (Right, 03/11/2015); I&D extremity (Right, 09/04/2015); Colon resection sigmoid (N/A, 06/09/2016); Colostomy (N/A, 06/09/2016); Central venous catheter insertion (Right, 06/09/2016); and Ileostomy closure (N/A, 07/15/2016).  Allergies  Allergen Reactions  . Doxycycline Other (See Comments)    Reaction: unknown  . Levaquin [Levofloxacin] Other (See Comments)    Causes joint pain  . Penicillins Rash and Other (See Comments)    Has patient had a PCN reaction causing immediate rash, facial/tongue/throat swelling, SOB or lightheadedness with hypotension: Yes Has patient had a PCN reaction causing severe rash involving mucus membranes or  skin necrosis: No Has patient had a PCN reaction that required hospitalization No Has patient had a PCN reaction occurring within the last 10 years: No If all of the above answers are "NO", then may proceed with Cephalosporin use.  . Sulfa Antibiotics Swelling and Rash    No current facility-administered medications on file prior to encounter.    Current Outpatient Prescriptions on File Prior to Encounter  Medication Sig  . acetaminophen (TYLENOL) 500 MG tablet Take 500 mg by mouth every 6 (six) hours as needed for mild pain, fever or headache.   Marland Kitchen amLODipine (NORVASC) 10 MG tablet Take 10 mg by mouth daily.  Marland Kitchen aspirin 81 MG tablet Take 81 mg by mouth 2 (two) times daily.   Marland Kitchen atorvastatin (LIPITOR) 40 MG tablet Take 40 mg by mouth daily.  . furosemide (LASIX) 20 MG tablet Take 40 mg by mouth daily.   Marland Kitchen gabapentin (NEURONTIN) 100 MG capsule Take 100 mg by mouth 3 (three) times daily.  . hydrALAZINE (APRESOLINE) 25 MG tablet Take 25 mg by mouth 2 (two) times daily. Take another 25mg  if Systolic consistently over [id number] or dystolic over 90  . hydrochlorothiazide (HYDRODIURIL) 25 MG tablet Take 25 mg by mouth daily.  Marland Kitchen ibuprofen (ADVIL,MOTRIN) 800 MG tablet Take 1 tablet (800 mg total) by mouth every 8 (eight) hours as needed.  . insulin aspart (NOVOLOG) 100 UNIT/ML injection Inject 12 Units into the skin 3 (three) times daily.   . insulin glargine (LANTUS) 100 UNIT/ML injection Inject 0.25 mLs (25 Units total) into the skin at bedtime.  Marland Kitchen levocetirizine (XYZAL) 5 MG tablet Take 5 mg by mouth every evening.  Marland Kitchen lisinopril (PRINIVIL,ZESTRIL) 20 MG tablet Take 20 mg by mouth  2 (two) times daily.   Marland Kitchen LORazepam (ATIVAN) 0.5 MG tablet Take 1 tablet (0.5 mg total) by mouth every 8 (eight) hours as needed for anxiety.  . metoprolol (LOPRESSOR) 50 MG tablet Take 50 mg by mouth 2 (two) times daily.  Marland Kitchen omeprazole (PRILOSEC) 20 MG capsule Take 20 mg by mouth daily.  . pioglitazone (ACTOS) 15 MG tablet Take  15 mg by mouth daily.  . potassium chloride (K-DUR) 10 MEQ tablet Take 10 mEq by mouth daily.  . predniSONE (DELTASONE) 5 MG tablet Take 2.5 mg by mouth daily.     FAMILY HISTORY:  Her indicated that her mother is deceased. She indicated that her father is deceased.    SOCIAL HISTORY: She  reports that she has quit smoking. She quit after 45.00 years of use. She has never used smokeless tobacco. She reports that she does not drink alcohol or use drugs.  REVIEW OF SYSTEMS:   Unable, pt sedated on vent.   SUBJECTIVE:    VITAL SIGNS: BP 114/60   Pulse (!) 57   Temp 98.1 F (36.7 C) (Rectal)   Resp (!) 27   Ht 5\' 6"  (1.676 m)   Wt 98 kg (216 lb)   SpO2 97%   BMI 34.86 kg/m   HEMODYNAMICS:    VENTILATOR SETTINGS: Vent Mode: PRVC FiO2 (%):  [70 %] 70 % Set Rate:  [24 bmp-30 bmp] 30 bmp Vt Set:  [360 mL] 360 mL PEEP:  [10 cmH20-12 cmH20] 10 cmH20 Plateau Pressure:  [28 cmH20-29 cmH20] 28 cmH20  INTAKE / OUTPUT: I/O last 3 completed shifts: In: 400 [I.V.:200; IV Piggyback:200] Out: -   PHYSICAL EXAMINATION: General:  Chronically ill appearing female, NAD  Neuro:  Unresponsive, pupils 65mm sluggish HEENT:  Mm moist, no JVD  Cardiovascular:  s1s2 bradycardic  Lungs:  resps even non labored on vent, coarse  Abdomen:  Round, mildly distended, rentention sutures in place, appear old, mild swelling erythema lower abd, hypoactive bs  Musculoskeletal:  Cool, clammy, no sig edema   LABS:  BMET  Recent Labs Lab 08/06/2016 0350 08/13/2016 0359  NA 139 140  K 4.7 4.6  CL 109 111  CO2 14*  --   BUN 27* 34*  CREATININE 2.29* 2.10*  GLUCOSE 369* 357*    Electrolytes  Recent Labs Lab 08/07/2016 0350  CALCIUM 8.4*  MG 1.7    CBC  Recent Labs Lab 08/20/2016 0350 08/14/2016 0359  WBC 19.4*  --   HGB 9.3* 9.9*  HCT 30.4* 29.0*  PLT 307  --     Coag's No results for input(s): APTT, INR in the last 168 hours.  Sepsis Markers  Recent Labs Lab 07/31/2016 0400  07/25/2016 0647  LATICACIDVEN 8.55* 4.07*    ABG  Recent Labs Lab 08/14/2016 0453 08/17/2016 0645  PHART 7.130* 7.295*  PCO2ART 56.5* 38.5  PO2ART 91.0 65.0*    Liver Enzymes  Recent Labs Lab 08/19/2016 0350  AST 71*  ALT 25  ALKPHOS 98  BILITOT 0.6  ALBUMIN 3.1*    Cardiac Enzymes  Recent Labs Lab 08/04/2016 0350  TROPONINI 0.09*    Glucose No results for input(s): GLUCAP in the last 168 hours.  Imaging Ct Abdomen Pelvis Wo Contrast  Result Date: 08/18/2016 CLINICAL DATA:  Abdominal wall abscess. EXAM: CT ABDOMEN AND PELVIS WITHOUT CONTRAST TECHNIQUE: Multidetector CT imaging of the abdomen and pelvis was performed following the standard protocol without IV contrast. COMPARISON:  06/16/2016 FINDINGS: Lower chest: Dense consolidation of the  central lower lobes, right greater than left. This most likely represents pneumonia. Small effusions. Hepatobiliary: No focal liver abnormality is seen. No gallstones, gallbladder wall thickening, or biliary dilatation. Pancreas: Unremarkable. No pancreatic ductal dilatation or surrounding inflammatory changes. Spleen: Normal in size without focal abnormality. Adrenals/Urinary Tract: Adrenal glands are unremarkable. Kidneys are normal, without renal calculi, focal lesion, or hydronephrosis. Bladder is decompressed around a Foley catheter. Stomach/Bowel: Nasogastric tube extends to the stomach. Stomach and small bowel are not obstructed. A loop of small bowel appears to protrude slightly through the right lower quadrant abdominal wall at the location of the prior ostomy. The colon is unobstructed. The sigmoid anastomosis appears unchanged. There is a small focal extension of air beyond the bowel contour at the sigmoid anastomosis corresponding to the focal irregularity observed on 07/14/2016 barium enema; no fistula or drainable abscess is evident at this location. Vascular/Lymphatic: No significant vascular findings are present. No enlarged  abdominal or pelvic lymph nodes. Reproductive: Status post hysterectomy. No adnexal masses. Other: Focal extraluminal air in the midline incision just above the level of the umbilicus, likely representing a small abscess measuring less than 2 cm, within the anterior abdominal wall. This is visible on image 65 series 2. There also is subcutaneous inflammatory change in the right lower quadrant at the site of the prior ostomy; this may represent cellulitis without significant drainable abscess. No ascites.  No free peritoneal air. Musculoskeletal: No acute or significant osseous findings. IMPRESSION: 1. Dense consolidation of the central lower lobes of both lungs, right worse than left. Likely pneumonia. Small pleural effusions bilaterally. 2. Probable small abdominal wall abscess in the midline supraumbilical region measuring less than 2 cm. There also are changes suggesting cellulitis in the subcutaneous fat of the right lower quadrant corresponding to the site of the prior ostomy. 3. No bowel obstruction. Electronically Signed   By: Andreas Newport M.D.   On: 07/31/2016 06:34   Dg Chest Port 1 View  Result Date: 08/13/2016 CLINICAL DATA:  Respiratory failure.  Intubation. EXAM: PORTABLE CHEST 1 VIEW COMPARISON:  06/15/2016 FINDINGS: Endotracheal tube is 3.4 cm above the carina. Nasogastric tube extends into the stomach and beyond the inferior edge of the image. There is dense consolidation in the central lungs bilaterally. No pneumothorax. IMPRESSION: 1. Satisfactorily positioned support equipment. 2. Dense airspace consolidation in the central lungs bilaterally. This could represent alveolar edema, infectious infiltrate, pulmonary hemorrhage. Electronically Signed   By: Andreas Newport M.D.   On: 08/24/2016 04:21     STUDIES:  CT abd/pelvis 10/30>>> 1. Dense consolidation of the central lower lobes of both lungs, right worse than left. Likely pneumonia. Small pleural effusions Bilaterally.  2.  Probable small abdominal wall abscess in the midline supraumbilical region measuring less than 2 cm. There also are changes suggesting cellulitis in the subcutaneous fat of the right lower quadrant corresponding to the site of the prior ostomy. 3. No bowel obstruction. CT head 10/30>>>  CULTURES: Sputum 10/30>>> BC x 2 10/30>>> Urine 10/30>>>  ANTIBIOTICS: Vanc 10/30>>> Imipenem 10/30>>> anidulafungin 10/30>>>  SIGNIFICANT EVENTS:   LINES/TUBES: ETT 10/30>>>  DISCUSSION:   ASSESSMENT / PLAN:  PULMONARY Acute respiratory failure  ?HCAP  ARDS  P:   Vent support - 8cc/kg  F/u CXR  F/u ABG  ARDS protocol  Empiric abx as above   CARDIOVASCULAR PEA arrest  Hx CAD  CHF  P:  No hypothermia protocol r/t sepsis, concern for significant infection  EKG  Echo  Trend troponin  Hold home lasix, norvasc, hctz, lisinopril, metoprolol  Low dose stress steroids for relative adrenal insuff- chronic pred (unclear etiology)   RENAL AKI on CKD - baseline Scr ~ 1.9 Mild AG acidosis  P:   Gentle volume  F/u chem  Correct resp acidosis   GASTROINTESTINAL Recent ileostomy takedown  ?abd wall abscess  P:   abx as above  Call surgery in am  Npo  pepcid   HEMATOLOGIC Anemia - mild  P:  SQ heparin  F/u CBC   INFECTIOUS ?intra-abd infection  ?HCAP  abd cellulitis  P:   Broad spectrum abx as above - vanc, imipenem, anidulafungin  Pan culture  F/u lactate, pct   ENDOCRINE DM  P:   ICU hyperglycemia protocol   NEUROLOGIC AMS - post prolonged PEA arrest  P:   RASS goal: -1 Low dose fentanyl gtt  CT head    FAMILY  - Updates:  No family available 10/30 - updated by EDP.   - Inter-disciplinary family meet or Palliative Care meeting due by:  11/6    Nickolas Madrid, NP 08/12/2016  7:32 AM Pager: [phone] or [phone]  STAFF NOTE: Linwood Dibbles, MD FACP have personally reviewed patient's available data, including medical history,  events of note, physical examination and test results as part of my evaluation. I have discussed with resident/NP and other care providers such as pharmacist, RN and RRT. In addition, I personally evaluated patient and elicited key findings of: not awake, not following commands, per sluggish, abdo soft, retention coils on abdo remain, no drainage, no r/g, lungs coarse distant, pcxr c/w severe ARDS infiltrates, CT abdo with small abscess chronic cellulitic?, BP wnl, hypothermia, leukocytosis, c.w sepsis syndrome abdo source, code down time is 40 min per EDP, concern severe anoxia, provide fluid 30 cc/kg then limit with concern risk brain edema, also would avoid free water, start aggressive nosococmial ABX, vanc, mero, anidulo, please note that CT was done without oral contrast, abdo exam reassuring, she has severe PNA / asp?, ards, keep plat less 30 but keep ph greater 7.25 with icp elevation risk, repeat abg, fio2 70% peep 10, rate 30, may need line will need to discuss goals with family  Prior to interventions, may need surgical abdo assessment, she is NOT a candidate hypothermia or TTM, will avoid fevers as able, get eeg, use mero vs imi with risk seizures post arrest, add stress roids, I have performed repeat sepsis assessment, lactic noted is POST arrest cause  The patient is noted to have a lactate>4. With the current information available to me, I don't think the patient is in septic shock. The lactate>4, is related to cardiac arrest.   The patient is critically ill with multiple organ systems failure and requires high complexity decision making for assessment and support, frequent evaluation and titration of therapies, application of advanced monitoring technologies and extensive interpretation of multiple databases.   Critical Care Time devoted to patient care services described in this note is 40 Minutes. This time reflects time of care of this signee: Merrie Roof, MD FACP. This critical care  time does not reflect procedure time, or teaching time or supervisory time of PA/NP/Med student/Med Resident etc but could involve care discussion time. Rest per NP/medical resident whose note is outlined above and that I agree with   Lavon Paganini. Titus Mould, MD, [place] Pgr: [facility] 08/10/2016 7:42 AM

## 2016-08-23 NOTE — Progress Notes (Signed)
Patient in questionable ARDS from post CPR. Started on settings of 6 cc tidal volumes and increased peeps ad Fi02. Patient tolerating at this time and ABG's done to correlate settings on vent. Patient on Current settings until they are seen by CCM.

## 2016-08-23 NOTE — ED Notes (Signed)
Fluid boluses not given due to CHF

## 2016-08-23 NOTE — Plan of Care (Signed)
  Interdisciplinary Goals of Care Family Meeting   Date carried out:: 08/08/2016  Location of the meeting: Conference room  Member's involved: Physician, Bedside Registered Nurse and Family Member or next of kin  Durable Power of Attorney or acting medical decision maker: Daughter Seth Bake and Haematologist were present. Son Aaron Edelman participated over the phone.    Discussion: We discussed goals of care for [name] .  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 They stated that her wishes would be to be taken off life support if there is no chance of meaningful recovery. We will get a EEG and monitor neurostatus. I recommended making her DNR. They are having a difficult time coming to this decision as Mrs Tames husband passed away recently and they are still grieving. He will remain full code for now.  Code status: Full Code  Disposition: Continue current acute care  Time spent for the meeting: 15 mins  Juris Gosnell 08/09/2016, 9:37 AM

## 2016-08-23 NOTE — ED Triage Notes (Signed)
Patient arrived via EMS.  Original call was for difficulty breathing and when fire arrived on scene she was on the floor on her hands and knees and when they turned her over she was apneic.  CPR started at 0241 with pulses back at 259.  Originally PEA, 3 Epi and returned to brady 40-60's.  Cap originally 70's an now 50-60's.  Purulent drainage noted from incision to the right lower abd  CBG 429  Upon arrival patient was being paced

## 2016-08-23 NOTE — Progress Notes (Signed)
Surgeon at bedside.  Removed 2 tubes from pt's abdomen.  Site remained clean dry and intact.  No changes notes.  abd dressing in place.  Will continue to monitor closely and update as needed.

## 2016-08-23 NOTE — ED Notes (Signed)
Patient being transported to CT by Cecille Rubin, RN , RT and CT Tech at this time

## 2016-08-23 NOTE — Progress Notes (Signed)
EEG completed; results pending.    

## 2016-08-23 NOTE — Procedures (Signed)
ELECTROENCEPHALOGRAM REPORT  Date of Study: 08/02/2016  Patient's Name: [name] MRN: [id number] Date of Birth: 10-06-1946  Referring Provider: Merrie Roof, MD  Clinical History: 70 year old woman status post PEA arrest.  Medications: Fentanyl Versed  Technical Summary: A multichannel digital EEG recording measured by the international 10-20 system with electrodes applied with paste and impedances below 5000 ohms performed in our laboratory with EKG monitoring in an intubated and sedated patient.  Hyperventilation and photic stimulation were not performed.  The digital EEG was referentially recorded, reformatted, and digitally filtered in a variety of bipolar and referential montages for optimal display.    Description: The patient is intubated and sedated during the recording. There is loss of normal background activity. The records read at a sensitivity of 3 uV/mm shows diffuse suppression of background activity. There is no spontaneous reactivity or reactivity noted with noxious stimulation. There is muscle artifact over the frontal leads. Hyperventilation and photic stimulation were not performed. Patient exhibited myoclonic jerks with associated 1 to 2 second bursts of movement artifact but no epileptiform discharges or electrographic seizures were seen.  EKG lead was unremarkable.  Impression: This EEG is markedly abnormal due to diffuse background suppression and lack of EEG reactivity with noxious stimulation, as well as anoxic myoclonus.  Clinical Correlation of the above findings indicates severe diffuse cerebral dysfunction that is non-specific in etiology and can be seen in the setting of anoxic/ischemic injury, toxic/metabolic encephalopathies, or medication effect. In the absence of CNS active, sedating, or anesthetic medications, this suggests a poor prognosis. Clinical correlation is advised.  Metta Clines, DO

## 2016-08-23 NOTE — Progress Notes (Signed)
Pt came from the ED with cardiac arrest. Pt is having some twitching episodes. The Pt's temp has been low most of the day. Pt isnt breathing over the vent. RT has decreased her Fio2 to 50%. BBS are clear

## 2016-08-23 NOTE — ED Notes (Signed)
[name] hugger applied - Dr Randal Buba aware of irritable heart rate and patient being cool and clammy

## 2016-08-23 NOTE — Progress Notes (Signed)
This note also relates to the following rows which could not be included: SpO2 - Cannot attach notes to unvalidated device data  Decreased to 10 per MD order

## 2016-08-23 NOTE — ED Notes (Signed)
Family at bedside. 

## 2016-08-23 NOTE — ED Provider Notes (Signed)
[facility] DEPT Provider Note   CSN: [id number] Arrival date & time: 08/09/2016  [id number]  By signing my name below, I, Maud Deed. Royston Sinner, attest that this documentation has been prepared under the direction and in the presence of Varick Keys, MD.  Electronically Signed: Maud Deed. Royston Sinner, ED Scribe. 08/06/2016. 4:29 AM.   History   Chief Complaint Chief Complaint  Patient presents with  . Cardiac Arrest   The history is provided by the EMS personnel. No language interpreter was used.  Cardiac Arrest  Witnessed by:  Healthcare provider Incident location:  Home Time before BLS initiated:  Immediate Time before ALS initiated:  Immediate Condition upon EMS arrival:  Apneic Pulse:  Absent Initial cardiac rhythm per EMS:  PEA Treatments prior to arrival:  ACLS protocol and vascular access Medications given prior to ED:  Epinephrine (3 rounds) IV access type:  Intraosseous Airway:  Bag valve mask (King airway) Rhythm on admission to ED: sinus bradycardia. Associated symptoms: shortness of breath   Risk factors: diabetes mellitus, heart problem and hyperlipidemia    LEVEL 5 CAVEAT- PT IS UNRESPONSIVE  HPI Comments: [name], brought in by EMS is a 69 y.o. female with a PMHx of CHF, chronic renal insufficiency, breast cancer, DM, diverticulitis, hyperlipidemia, HTN, and thyroid disease who presents to the Emergency Department here post cardiac arrest this evening. Per EMS, call was initially placed for sudden onset shortness of breath. When fire arrived on scene, pt was found on the floor on her hands and knees. When patient was turned over she was apneic. CPR was initiated at 0241 with pulses back at 0259. Prior to arrival, pt was given 3 rounds of Epi and returned to bradycardia 40-60s. CBG 429 upon arrival.  Per triage note, pt underwent recent surgical procedure to abdomen. Purulent drainage noted from incision to the R lower abdomen.  PCP: Glendon Axe, MD    Past Medical  History:  Diagnosis Date  . Arthritis    Inflammatory  . Cancer ([place])   . CHF (congestive heart failure) ([place])   . Chronic renal insufficiency Melvin Whiteford 15, 2016  . Coronary artery dilation   . Diabetes mellitus without complication ([place])    Type II  . Diverticulitis   . Hyperlipidemia   . Hypertension   . Renal insufficiency   . Sinus problem   . Thyroid disease   . Ulcer of left lower leg [facility]) Bryley Chrisman 15, 2016    Patient Active Problem List   Diagnosis Date Noted  . Dehydration   . Acute on chronic renal failure ([place]) 07/11/2016  . Temporal arteritis ([place]) 07/07/2016  . Diverticulitis of colon   . Hypertensive heart disease without heart failure 05/10/2016  . Snoring 05/10/2016  . Grief 02/22/2016  . 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 Pyoderma gangrenosa 11/12/2015  . Demand ischemia ([place]) 11/07/2015  . Uncontrolled hypertension 11/05/2015  . Uncontrolled diabetes mellitus ([place]) 11/05/2015  . Type 2 diabetes mellitus with hyperglycemia ([place]) 11/05/2015  . 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 Varicose veins of left lower extremity with ulcer of calf ([place]) 08/04/2015  . Venous stasis ulcer of ankle ([place]) 07/15/2015  . DDD (degenerative disc disease), lumbar 04/16/2015  . Chronic kidney disease, stage 4, severely decreased GFR (HCC) 03/19/2015  . S/P cardiac catheterization 05/27/2014  . Bilateral carpal tunnel syndrome 03/01/2014  . Degenerative joint disease (DJD)  of hip 03/01/2014  . Hyperlipidemia 03/01/2014  . Hypothyroid 03/01/2014    Past Surgical History:  Procedure Laterality Date  . ABDOMINAL HYSTERECTOMY    . CATARACT EXTRACTION W/PHACO Right 03/11/2015   Procedure: CATARACT EXTRACTION PHACO AND INTRAOCULAR LENS PLACEMENT (IOC);  Surgeon: Birder Robson, MD;  Location: [facility] ORS;  Service: Ophthalmology;   Laterality: Right;  [place] 00:40 AP% 24.3 CDE 9.86  . CENTRAL VENOUS CATHETER INSERTION Right 06/09/2016   Procedure: INSERTION CENTRAL LINE ADULT;  Surgeon: Dia Crawford III, MD;  Location: [facility] ORS;  Service: General;  Laterality: Right;  . COLON RESECTION SIGMOID N/A 06/09/2016   Procedure: COLON RESECTION SIGMOID;  Surgeon: Dia Crawford III, MD;  Location: [facility] ORS;  Service: General;  Laterality: N/A;  . COLOSTOMY N/A 06/09/2016   Procedure: COLOSTOMY;  Surgeon: Dia Crawford III, MD;  Location: [facility] ORS;  Service: General;  Laterality: N/A;  . CYST EXCISION    . DILATION AND CURETTAGE OF UTERUS  1984  . I&D EXTREMITY Right 09/04/2015   Procedure: IRRIGATION AND DEBRIDEMENT EXTREMITY/ AND BIOPSY;  Surgeon: Algernon Huxley, MD;  Location: [facility] ORS;  Service: Vascular;  Laterality: Right;  . ILEOSTOMY CLOSURE N/A 07/15/2016   Procedure: ILEOSTOMY TAKEDOWN;  Surgeon: Dia Crawford III, MD;  Location: [facility] ORS;  Service: General;  Laterality: N/A;  . PARTIAL HYSTERECTOMY  1985  . ROTATOR CUFF REPAIR  2004    OB History    Gravida Para Term Preterm AB Living   4 3     1      SAB TAB Ectopic Multiple Live Births   1              Obstetric Comments   1st Menstrual Cycle:  11  1st Pregnancy:  17       Home Medications    Prior to Admission medications   Medication Sig Start Date End Date Taking? Authorizing Provider  acetaminophen (TYLENOL) 500 MG tablet Take 500 mg by mouth every 6 (six) hours as needed for mild pain, fever or headache.     Historical Provider, MD  amLODipine (NORVASC) 10 MG tablet Take 10 mg by mouth daily.    Historical Provider, MD  aspirin 81 MG tablet Take 81 mg by mouth 2 (two) times daily.     Historical Provider, MD  atorvastatin (LIPITOR) 40 MG tablet Take 40 mg by mouth daily.    Historical Provider, MD  furosemide (LASIX) 20 MG tablet Take 40 mg by mouth daily.     Historical Provider, MD  gabapentin (NEURONTIN) 100 MG capsule Take 100 mg by mouth 3 (three) times daily.     Historical Provider, MD  hydrALAZINE (APRESOLINE) 25 MG tablet Take 25 mg by mouth 2 (two) times daily. Take another 25mg  if Systolic consistently over [id number] or dystolic over 90    Historical Provider, MD  hydrochlorothiazide (HYDRODIURIL) 25 MG tablet Take 25 mg by mouth daily.    Historical Provider, MD  ibuprofen (ADVIL,MOTRIN) 800 MG tablet Take 1 tablet (800 mg total) by mouth every 8 (eight) hours as needed. 06/22/16   Hubbard Robinson, MD  insulin aspart (NOVOLOG) 100 UNIT/ML injection Inject 12 Units into the skin 3 (three) times daily.     Historical Provider, MD  insulin glargine (LANTUS) 100 UNIT/ML injection Inject 0.25 mLs (25 Units total) into the skin at bedtime. 11/10/15   Nicholes Mango, MD  levocetirizine (XYZAL) 5 MG tablet Take 5 mg by mouth every evening.    Historical Provider,  MD  lisinopril (PRINIVIL,ZESTRIL) 20 MG tablet Take 20 mg by mouth 2 (two) times daily.     Historical Provider, MD  LORazepam (ATIVAN) 0.5 MG tablet Take 1 tablet (0.5 mg total) by mouth every 8 (eight) hours as needed for anxiety. 02/17/16 02/16/17  Earleen Newport, MD  metoprolol (LOPRESSOR) 50 MG tablet Take 50 mg by mouth 2 (two) times daily.    Historical Provider, MD  omeprazole (PRILOSEC) 20 MG capsule Take 20 mg by mouth daily.    Historical Provider, MD  pioglitazone (ACTOS) 15 MG tablet Take 15 mg by mouth daily.    Historical Provider, MD  potassium chloride (K-DUR) 10 MEQ tablet Take 10 mEq by mouth daily.    Historical Provider, MD  predniSONE (DELTASONE) 5 MG tablet Take 2.5 mg by mouth daily.     Historical Provider, MD    Family History Family History  Problem Relation Age of Onset  . Diabetes Mellitus II Mother   . Diabetes Mellitus II Father     Social History Social History  Substance Use Topics  . Smoking status: Former Smoker    Years: 45.00  . Smokeless tobacco: Never Used  . Alcohol use No     Allergies   Doxycycline; Levaquin [levofloxacin]; Penicillins; and Sulfa  antibiotics   Review of Systems Review of Systems  Unable to perform ROS: Patient unresponsive  Respiratory: Positive for shortness of breath. Negative for stridor.      Physical Exam Updated Vital Signs BP 147/83 (BP Location: Right Arm)   Pulse 98   Temp 98.1 F (36.7 C) (Rectal)   Resp (!) 28   Wt 216 lb (98 kg)   SpO2 100%   BMI 34.86 kg/m   Physical Exam  Constitutional: She appears well-developed and well-nourished.  HENT:  Head: Normocephalic and atraumatic.  Right Ear: External ear normal.  Left Ear: External ear normal.  Nose: Nose normal.  Mouth/Throat: No oropharyngeal exudate.  Eyes: Conjunctivae are normal.  Neck: Neck supple. No JVD present. No tracheal deviation present.  Cardiovascular: Normal rate, regular rhythm, normal heart sounds and intact distal pulses.   Pulmonary/Chest: No stridor.  Diminished breath sounds noted  Abdominal: Soft. She exhibits no distension. There is no tenderness.  Midline incision noted with staples. Purulent drained noted from incision sites. Abdomen hot to touch. No bowel sounds noted.  Musculoskeletal: She exhibits no deformity.  Compartments firm.  Lymphadenopathy:    She has no cervical adenopathy.  Neurological: She is unresponsive. GCS eye subscore is 1. GCS verbal subscore is 1. GCS motor subscore is 1.  GCS of 3.  Skin: Skin is warm and dry. Capillary refill takes less than 2 seconds.  Plethora of the face.  Psychiatric:  unable  Nursing note and vitals reviewed.    ED Treatments / Results   Vitals:   08/12/2016 0615 08/19/2016 0630  BP: 120/64 118/62  Pulse: 65 61  Resp: 14 (!) 7  Temp:      DIAGNOSTIC STUDIES: Oxygen Saturation is 100% on intubated, Normal by my interpretation.    COORDINATION OF CARE: 4:24 AM- Will give Zemuron, Amidate, and Vancomycin. Will order blood work, urinalysis, and CXR. Discussed treatment plan with pt at bedside and pt agreed to plan.     Results for orders placed or  performed during the hospital encounter of 08/20/2016  Comprehensive metabolic panel  Result Value Ref Range   Sodium 139 135 - 145 mmol/L   Potassium 4.7 3.5 - 5.1 mmol/L  Chloride 109 101 - 111 mmol/L   CO2 14 (L) 22 - 32 mmol/L   Glucose, Bld 369 (H) 65 - 99 mg/dL   BUN 27 (H) 6 - 20 mg/dL   Creatinine, Ser 2.29 (H) 0.44 - 1.00 mg/dL   Calcium 8.4 (L) 8.9 - 10.3 mg/dL   Total Protein 6.9 6.5 - 8.1 g/dL   Albumin 3.1 (L) 3.5 - 5.0 g/dL   AST 71 (H) 15 - 41 U/L   ALT 25 14 - 54 U/L   Alkaline Phosphatase 98 38 - 126 U/L   Total Bilirubin 0.6 0.3 - 1.2 mg/dL   GFR calc non Af Amer 20 (L) >60 mL/min   GFR calc Af Amer 24 (L) >60 mL/min   Anion gap 16 (H) 5 - 15  Urinalysis, Routine w reflex microscopic (not at [facility])  Result Value Ref Range   Color, Urine YELLOW YELLOW   APPearance CLOUDY (A) CLEAR   Specific Gravity, Urine 1.012 1.005 - 1.030   pH 6.0 5.0 - 8.0   Glucose, UA >1000 (A) NEGATIVE mg/dL   Hgb urine dipstick MODERATE (A) NEGATIVE   Bilirubin Urine NEGATIVE NEGATIVE   Ketones, ur NEGATIVE NEGATIVE mg/dL   Protein, ur 100 (A) NEGATIVE mg/dL   Nitrite NEGATIVE NEGATIVE   Leukocytes, UA NEGATIVE NEGATIVE  Magnesium  Result Value Ref Range   Magnesium 1.7 1.7 - 2.4 mg/dL  CBC with Differential/Platelet  Result Value Ref Range   WBC 19.4 (H) 4.0 - 10.5 K/uL   RBC 3.35 (L) 3.87 - 5.11 MIL/uL   Hemoglobin 9.3 (L) 12.0 - 15.0 g/dL   HCT 30.4 (L) 36.0 - 46.0 %   MCV 90.7 78.0 - 100.0 fL   MCH 27.8 26.0 - 34.0 pg   MCHC 30.6 30.0 - 36.0 g/dL   RDW 15.1 11.5 - 15.5 %   Platelets 307 150 - 400 K/uL   Neutrophils Relative % 71 %   Neutro Abs 13.7 (H) 1.7 - 7.7 K/uL   Lymphocytes Relative 23 %   Lymphs Abs 4.5 (H) 0.7 - 4.0 K/uL   Monocytes Relative 5 %   Monocytes Absolute 0.9 0.1 - 1.0 K/uL   Eosinophils Relative 1 %   Eosinophils Absolute 0.3 0.0 - 0.7 K/uL   Basophils Relative 0 %   Basophils Absolute 0.1 0.0 - 0.1 K/uL  Troponin I  Result Value Ref Range    Troponin I 0.09 (HH) <0.03 ng/mL  Urine microscopic-add on  Result Value Ref Range   Squamous Epithelial / LPF 0-5 (A) NONE SEEN   WBC, UA 6-30 0 - 5 WBC/hpf   RBC / HPF 6-30 0 - 5 RBC/hpf   Bacteria, UA FEW (A) NONE SEEN  I-Stat CG4 Lactic Acid, ED  Result Value Ref Range   Lactic Acid, Venous 8.55 (HH) 0.5 - 1.9 mmol/L   Comment NOTIFIED PHYSICIAN   I-stat chem 8, ed  Result Value Ref Range   Sodium 140 135 - 145 mmol/L   Potassium 4.6 3.5 - 5.1 mmol/L   Chloride 111 101 - 111 mmol/L   BUN 34 (H) 6 - 20 mg/dL   Creatinine, Ser 2.10 (H) 0.44 - 1.00 mg/dL   Glucose, Bld 357 (H) 65 - 99 mg/dL   Calcium, Ion 1.10 (L) 1.15 - 1.40 mmol/L   TCO2 17 0 - 100 mmol/L   Hemoglobin 9.9 (L) 12.0 - 15.0 g/dL   HCT 29.0 (L) 36.0 - 46.0 %  I-stat troponin, ED  Result  Value Ref Range   Troponin i, poc 0.03 0.00 - 0.08 ng/mL   Comment 3          I-Stat arterial blood gas, ED  Result Value Ref Range   pH, Arterial 7.130 (LL) 7.350 - 7.450   pCO2 arterial 56.5 (H) 32.0 - 48.0 mmHg   pO2, Arterial 91.0 83.0 - 108.0 mmHg   Bicarbonate 18.8 (L) 20.0 - 28.0 mmol/L   TCO2 20 0 - 100 mmol/L   O2 Saturation 93.0 %   Acid-base deficit 10.0 (H) 0.0 - 2.0 mmol/L   Patient temperature 98.9 F    Collection site RADIAL, ALLEN'S TEST ACCEPTABLE    Drawn by Operator    Sample type ARTERIAL    Comment NOTIFIED PHYSICIAN    Ct Abdomen Pelvis Wo Contrast  Result Date: 07/25/2016 CLINICAL DATA:  Abdominal wall abscess. EXAM: CT ABDOMEN AND PELVIS WITHOUT CONTRAST TECHNIQUE: Multidetector CT imaging of the abdomen and pelvis was performed following the standard protocol without IV contrast. COMPARISON:  06/16/2016 FINDINGS: Lower chest: Dense consolidation of the central lower lobes, right greater than left. This most likely represents pneumonia. Small effusions. Hepatobiliary: No focal liver abnormality is seen. No gallstones, gallbladder wall thickening, or biliary dilatation. Pancreas: Unremarkable. No  pancreatic ductal dilatation or surrounding inflammatory changes. Spleen: Normal in size without focal abnormality. Adrenals/Urinary Tract: Adrenal glands are unremarkable. Kidneys are normal, without renal calculi, focal lesion, or hydronephrosis. Bladder is decompressed around a Foley catheter. Stomach/Bowel: Nasogastric tube extends to the stomach. Stomach and small bowel are not obstructed. A loop of small bowel appears to protrude slightly through the right lower quadrant abdominal wall at the location of the prior ostomy. The colon is unobstructed. The sigmoid anastomosis appears unchanged. There is a small focal extension of air beyond the bowel contour at the sigmoid anastomosis corresponding to the focal irregularity observed on 07/14/2016 barium enema; no fistula or drainable abscess is evident at this location. Vascular/Lymphatic: No significant vascular findings are present. No enlarged abdominal or pelvic lymph nodes. Reproductive: Status post hysterectomy. No adnexal masses. Other: Focal extraluminal air in the midline incision just above the level of the umbilicus, likely representing a small abscess measuring less than 2 cm, within the anterior abdominal wall. This is visible on image 65 series 2. There also is subcutaneous inflammatory change in the right lower quadrant at the site of the prior ostomy; this may represent cellulitis without significant drainable abscess. No ascites.  No free peritoneal air. Musculoskeletal: No acute or significant osseous findings. IMPRESSION: 1. Dense consolidation of the central lower lobes of both lungs, right worse than left. Likely pneumonia. Small pleural effusions bilaterally. 2. Probable small abdominal wall abscess in the midline supraumbilical region measuring less than 2 cm. There also are changes suggesting cellulitis in the subcutaneous fat of the right lower quadrant corresponding to the site of the prior ostomy. 3. No bowel obstruction. Electronically  Signed   By: Andreas Newport M.D.   On: 07/29/2016 06:34   Dg Chest Port 1 View  Result Date: 08/04/2016 CLINICAL DATA:  Respiratory failure.  Intubation. EXAM: PORTABLE CHEST 1 VIEW COMPARISON:  06/15/2016 FINDINGS: Endotracheal tube is 3.4 cm above the carina. Nasogastric tube extends into the stomach and beyond the inferior edge of the image. There is dense consolidation in the central lungs bilaterally. No pneumothorax. IMPRESSION: 1. Satisfactorily positioned support equipment. 2. Dense airspace consolidation in the central lungs bilaterally. This could represent alveolar edema, infectious infiltrate, pulmonary hemorrhage. Electronically Signed  By: Andreas Newport M.D.   On: 08/04/2016 04:21    )  Procedures Procedure Name: Intubation Date/Time: 08/24/2016 6:52 AM Performed by: Veatrice Kells Pre-anesthesia Checklist: Patient identified, Suction available and Patient being monitored Oxygen Delivery Method: Ambu bag Preoxygenation: Pre-oxygenation with 100% oxygen Intubation Type: Rapid sequence Ventilation: Mask ventilation without difficulty Laryngoscope Size: Glidescope and 4 Grade View: Grade II Tube type: Non-subglottic suction tube Tube size: 7.5 mm Number of attempts: 1 Intubation method: Levitan method. Placement Confirmation: CO2 detector,  Positive ETCO2,  Breath sounds checked- equal and bilateral and ETT inserted through vocal cords under direct vision Secured at: 22 cm Tube secured with: ETT holder      (including critical care time) MDM Reviewed: previous chart, nursing note and vitals Reviewed previous: labs Interpretation: ECG, labs, x-ray and CT scan (ARD by me on CXR, leukocytosis by me of 19.4, elevated creatinine lactic acidosis, abdominal wall collection by me on CT) Total time providing critical care: > 105 minutes. This excludes time spent performing separately reportable procedures and services. Consults: critical care   CRITICAL  CARE Performed by: Sahir Tolson, MD  Total critical care time:120 minutes Critical care time was exclusive of separately billable procedures and treating other patients. Critical care was necessary to treat or prevent imminent or life-threatening deterioration. Critical care was time spent personally by me on the following activities: development of treatment plan with patient and/or surrogate as well as nursing, discussions with consultants, evaluation of patient's response to treatment, examination of patient, obtaining history from patient or surrogate, ordering and performing treatments and interventions, ordering and review of laboratory studies, ordering and review of radiographic studies, pulse oximetry and re-evaluation of patient's condition.    Medications  etomidate (AMIDATE) injection (20 mg Intravenous Given 08/17/2016 0403)  rocuronium (ZEMURON) injection (100 mg Intravenous Given 07/31/2016 0405)  fentaNYL (SUBLIMAZE) injection 50 mcg (0 mcg Intravenous Hold 08/01/2016 0542)  fentaNYL 2555mcg in NS 233mL (45mcg/ml) infusion-PREMIX (25 mcg/hr Intravenous New Bag/Given 08/01/2016 0553)  fentaNYL (SUBLIMAZE) bolus via infusion 50 mcg (50 mcg Intravenous Bolus from Bag 08/16/2016 0551)  midazolam (VERSED) injection 2 mg (not administered)  midazolam (VERSED) injection 2 mg (not administered)  imipenem-cilastatin (PRIMAXIN) 500 mg in sodium chloride 0.9 % 100 mL IVPB (not administered)  vancomycin (VANCOCIN) IVPB 1000 mg/200 mL premix (0 mg Intravenous Stopped 07/31/2016 0536)     Final Clinical Impressions(s) / ED Diagnoses  Abdominal wall abscess Cellulitis Lactic Acidosis.   Pleural effusions Cardiac arrest  New Prescriptions New Prescriptions   No medications on file  I personally performed the services described in this documentation, which was scribed in my presence. The recorded information has been reviewed and is accurate.      Cassaundra Rasch, MD 08/20/2016 0700

## 2016-08-23 NOTE — ED Notes (Signed)
Cleaned up of small amount of watery greenish drainage from rectum

## 2016-08-23 NOTE — ED Notes (Signed)
Pt. Transferred to Ct scan , VQ scan and then to 2H02.  Pt. Stable upon transfer.

## 2016-08-23 NOTE — Progress Notes (Signed)
Pharmacy Antibiotic Note  [name] is a 70 y.o. female admitted on 08/02/2016 after cardiac arrest. She received CPR in the field for about 15 minutes with ROSC. Of note pt had a recent ileostomy take down in September. Her lactic acid is elevated, as expected s/p CPR, and is downtrending. She also has CKD at baseline, SCr 2.1, eCrCl 25-30 ml/min. Starting empiric antibiotics. Pt received a low vancomycin load in the ED. She will require an additional 1 g vancomcyin to fully complete the loading dose.  Plan: Give an additional vancomycin 1 g to complete load Vancomycin 1 IV every 24 hours.  Goal trough 15-20 mcg/mL. Meropenem 1 g IV q12h Eraxis 200 mg IV x1 then 100 mg IV q24h Monitor renal fx, cultures, VT at Css   Height: 5\' 6"  (167.6 cm) Weight: 216 lb (98 kg) IBW/kg (Calculated) : 59.3  Temp (24hrs), Avg:98.1 F (36.7 C), Min:98.1 F (36.7 C), Max:98.1 F (36.7 C)   Recent Labs Lab 07/30/2016 0350 08/22/2016 0359 08/07/2016 0400 08/07/2016 0647  WBC 19.4*  --   --   --   CREATININE 2.29* 2.10*  --   --   LATICACIDVEN  --   --  8.55* 4.07*    Estimated Creatinine Clearance: 29.4 mL/min (by C-G formula based on SCr of 2.1 mg/dL (H)).     Antimicrobials this admission: 10/30 vancomycin >> 10/30 meroepnem >> 10/30 anidulafungin >> 10/30 Primaxin x1  Dose adjustments this admission: NA  Microbiology results: 10/30 urine cx: 10/30 blood cx:  Thank you for allowing pharmacy to be a part of this patient's care.   Harvel Quale 08/22/2016 7:43 AM

## 2016-08-23 NOTE — Progress Notes (Signed)
ABG done with Panic pH level results reported to Dr. Randal Buba in ER. Expected values. Increased Vent rate from 24 to 30 to help compensate for the time being until seen by CCM for official vent orders.. X ray very fluffy and looked like possible early ARDS.    08/07/2016 0445  Vent Select  Invasive or Noninvasive Invasive  Adult Vent Y  Adult Ventilator Settings  Vent Type Servo i  Humidity HME  Vent Mode PRVC  Vt Set 360 mL  Set Rate 30 bmp  FiO2 (%) 70 %  PEEP 12 cmH20  Adult Ventilator Measurements  Peak Airway Pressure 31 L/min  Mean Airway Pressure 20 cmH20  Plateau Pressure 28 cmH20  Resp Rate Spontaneous 0 br/min  Resp Rate Total 30 br/min  Exhaled Vt 357 mL  Spont TV 361 mL  Measured Ve 10.8 mL  I:E Ratio Measured 1:1.4  SpO2 94 %  Adult Ventilator Alarms  Alarms On Y  Ve High Alarm 18 L/min  Ve Low Alarm 8 L/min  Resp Rate High Alarm 40 br/min  Resp Rate Low Alarm 26  PEEP Low Alarm 6 cmH2O  Press High Alarm 45 cmH2O  VAP Prevention  HOB> 30 Degrees Y  *

## 2016-08-24 ENCOUNTER — Inpatient Hospital Stay (HOSPITAL_COMMUNITY): Payer: Commercial Managed Care - HMO

## 2016-08-24 DIAGNOSIS — I469 Cardiac arrest, cause unspecified: Secondary | ICD-10-CM

## 2016-08-24 LAB — GLUCOSE, CAPILLARY
GLUCOSE-CAPILLARY: 110 mg/dL — AB (ref 65–99)
GLUCOSE-CAPILLARY: 174 mg/dL — AB (ref 65–99)
GLUCOSE-CAPILLARY: 198 mg/dL — AB (ref 65–99)
GLUCOSE-CAPILLARY: 202 mg/dL — AB (ref 65–99)
Glucose-Capillary: 135 mg/dL — ABNORMAL HIGH (ref 65–99)
Glucose-Capillary: 161 mg/dL — ABNORMAL HIGH (ref 65–99)
Glucose-Capillary: 210 mg/dL — ABNORMAL HIGH (ref 65–99)
Glucose-Capillary: 243 mg/dL — ABNORMAL HIGH (ref 65–99)
Glucose-Capillary: 274 mg/dL — ABNORMAL HIGH (ref 65–99)

## 2016-08-24 LAB — CBC
HCT: 25.6 % — ABNORMAL LOW (ref 36.0–46.0)
Hemoglobin: 8.2 g/dL — ABNORMAL LOW (ref 12.0–15.0)
MCH: 28.5 pg (ref 26.0–34.0)
MCHC: 32 g/dL (ref 30.0–36.0)
MCV: 88.9 fL (ref 78.0–100.0)
PLATELETS: 232 10*3/uL (ref 150–400)
RBC: 2.88 MIL/uL — ABNORMAL LOW (ref 3.87–5.11)
RDW: 15.4 % (ref 11.5–15.5)
WBC: 22.4 10*3/uL — AB (ref 4.0–10.5)

## 2016-08-24 LAB — BASIC METABOLIC PANEL
ANION GAP: 9 (ref 5–15)
BUN: 42 mg/dL — ABNORMAL HIGH (ref 6–20)
CALCIUM: 8.8 mg/dL — AB (ref 8.9–10.3)
CO2: 20 mmol/L — ABNORMAL LOW (ref 22–32)
Chloride: 114 mmol/L — ABNORMAL HIGH (ref 101–111)
Creatinine, Ser: 2.59 mg/dL — ABNORMAL HIGH (ref 0.44–1.00)
GFR, EST AFRICAN AMERICAN: 20 mL/min — AB (ref >60)
GFR, EST NON AFRICAN AMERICAN: 18 mL/min — AB (ref >60)
GLUCOSE: 146 mg/dL — AB (ref 65–99)
Potassium: 4.4 mmol/L (ref 3.5–5.1)
SODIUM: 143 mmol/L (ref 135–145)

## 2016-08-24 LAB — URINE CULTURE: Culture: NO GROWTH

## 2016-08-24 LAB — PROCALCITONIN: PROCALCITONIN: 7.05 ng/mL

## 2016-08-24 LAB — PHOSPHORUS: Phosphorus: 4.2 mg/dL (ref 2.5–4.6)

## 2016-08-24 LAB — ECHOCARDIOGRAM COMPLETE
Height: 66 in
Weight: 3463.87 oz

## 2016-08-24 LAB — MAGNESIUM: MAGNESIUM: 1.4 mg/dL — AB (ref 1.7–2.4)

## 2016-08-24 LAB — TRIGLYCERIDES: TRIGLYCERIDES: 44 mg/dL (ref <150)

## 2016-08-24 MED ORDER — PROPOFOL 1000 MG/100ML IV EMUL
0.0000 ug/kg/min | INTRAVENOUS | Status: DC
  Administered 2016-08-24: 5 ug/kg/min via INTRAVENOUS
  Administered 2016-08-24: 30 ug/kg/min via INTRAVENOUS
  Administered 2016-08-24 (×2): 20 ug/kg/min via INTRAVENOUS
  Administered 2016-08-25: 25 ug/kg/min via INTRAVENOUS
  Administered 2016-08-25: 30 ug/kg/min via INTRAVENOUS
  Administered 2016-08-26 (×2): 25 ug/kg/min via INTRAVENOUS
  Filled 2016-08-24 (×9): qty 100

## 2016-08-24 MED ORDER — MAGNESIUM SULFATE IN D5W 1-5 GM/100ML-% IV SOLN
1.0000 g | Freq: Once | INTRAVENOUS | Status: AC
  Administered 2016-08-24: 1 g via INTRAVENOUS
  Filled 2016-08-24: qty 100

## 2016-08-24 MED ORDER — FUROSEMIDE 10 MG/ML IJ SOLN
40.0000 mg | Freq: Once | INTRAMUSCULAR | Status: AC
  Administered 2016-08-24: 40 mg via INTRAVENOUS
  Filled 2016-08-24: qty 4

## 2016-08-24 MED ORDER — ADULT MULTIVITAMIN LIQUID CH
15.0000 mL | Freq: Every day | ORAL | Status: DC
  Administered 2016-08-25 – 2016-08-31 (×7): 15 mL via ORAL
  Filled 2016-08-24 (×8): qty 15

## 2016-08-24 MED ORDER — INSULIN GLARGINE 100 UNIT/ML ~~LOC~~ SOLN
10.0000 [IU] | Freq: Every day | SUBCUTANEOUS | Status: DC
  Administered 2016-08-24 – 2016-08-25 (×2): 10 [IU] via SUBCUTANEOUS
  Filled 2016-08-24 (×3): qty 0.1

## 2016-08-24 MED ORDER — PRO-STAT SUGAR FREE PO LIQD
30.0000 mL | Freq: Every day | ORAL | Status: DC
  Administered 2016-08-24 – 2016-08-31 (×33): 30 mL via ORAL
  Filled 2016-08-24 (×29): qty 30

## 2016-08-24 MED ORDER — INSULIN ASPART 100 UNIT/ML ~~LOC~~ SOLN
1.0000 [IU] | SUBCUTANEOUS | Status: DC
  Administered 2016-08-24: 3 [IU] via SUBCUTANEOUS
  Administered 2016-08-24 (×2): 2 [IU] via SUBCUTANEOUS
  Administered 2016-08-24 (×2): 3 [IU] via SUBCUTANEOUS

## 2016-08-24 MED ORDER — VITAL HIGH PROTEIN PO LIQD
1000.0000 mL | ORAL | Status: DC
  Administered 2016-08-24 – 2016-08-30 (×5): 1000 mL
  Filled 2016-08-24: qty 1000

## 2016-08-24 MED ORDER — INSULIN ASPART 100 UNIT/ML ~~LOC~~ SOLN: 0.0000 [IU] | Freq: Three times a day (TID) | SUBCUTANEOUS | Status: DC

## 2016-08-24 MED ORDER — INSULIN ASPART 100 UNIT/ML ~~LOC~~ SOLN
0.0000 [IU] | SUBCUTANEOUS | Status: DC
  Administered 2016-08-25 (×2): 5 [IU] via SUBCUTANEOUS

## 2016-08-24 NOTE — Progress Notes (Signed)
PULMONARY / CRITICAL CARE MEDICINE   Name: [name] MRN: [id number] DOB: [birth year]    ADMISSION DATE:  08/01/2016  REFERRING MD:  Randal Buba (EDP)   CHIEF COMPLAINT:  PEA arrest   HISTORY OF PRESENT ILLNESS:   70yo female with hx CHF, DM, HTN, breast cancer with recent ileostomy takedown 07/15/16 presented 10/30 after PEA arrest at home.  She called EMS for sudden onset SOB.  When fire arrived she was apneic and pulseless.  She had ~30-40 mins CPR with epi x 3 prior to ROSC.   In ER she remained bradycardic but hemodynamically stable, did not require pressors.  Remained essentially unresponsive without purposeful movements. PCCM called to admit.   PAST MEDICAL HISTORY :  She  has a past medical history of Arthritis; Cancer [facility]); CHF (congestive heart failure) ([place]); Chronic renal insufficiency (February 07, 2015); Coronary artery dilation; Diabetes mellitus without complication ([place]); Diverticulitis; Hyperlipidemia; Hypertension; Renal insufficiency; Sinus problem; Thyroid disease; and Ulcer of left lower leg [facility]) (February 07, 2015).  PAST SURGICAL HISTORY: She  has a past surgical history that includes Abdominal hysterectomy; Dilation and curettage of uterus (1984); Partial hysterectomy (1985); Rotator cuff repair (2004); Cyst excision; Cataract extraction w/PHACO (Right, 03/11/2015); I&D extremity (Right, 09/04/2015); Colon resection sigmoid (N/A, 06/09/2016); Colostomy (N/A, 06/09/2016); Central venous catheter insertion (Right, 06/09/2016); and Ileostomy closure (N/A, 07/15/2016).  Allergies  Allergen Reactions  . Doxycycline Other (See Comments)    Reaction: unknown  . Levaquin [Levofloxacin] Other (See Comments)    Causes joint pain  . Penicillins Rash and Other (See Comments)    Has patient had a PCN reaction causing immediate rash, facial/tongue/throat swelling, SOB or lightheadedness with hypotension: Yes Has patient had a PCN reaction causing severe rash involving mucus membranes or  skin necrosis: No Has patient had a PCN reaction that required hospitalization No Has patient had a PCN reaction occurring within the last 10 years: No If all of the above answers are "NO", then may proceed with Cephalosporin use.  . Sulfa Antibiotics Swelling and Rash    No current facility-administered medications on file prior to encounter.    Current Outpatient Prescriptions on File Prior to Encounter  Medication Sig  . acetaminophen (TYLENOL) 500 MG tablet Take 500 mg by mouth every 6 (six) hours as needed for mild pain, fever or headache.   Marland Kitchen amLODipine (NORVASC) 10 MG tablet Take 10 mg by mouth daily.  Marland Kitchen aspirin 81 MG tablet Take 81 mg by mouth daily.   Marland Kitchen atorvastatin (LIPITOR) 40 MG tablet Take 40 mg by mouth daily.  Marland Kitchen gabapentin (NEURONTIN) 100 MG capsule Take 100 mg by mouth 3 (three) times daily.  . hydrALAZINE (APRESOLINE) 25 MG tablet Take 25 mg by mouth daily as needed. Take another 25mg  if Systolic consistently over [id number] or dystolic over 90   . insulin aspart (NOVOLOG) 100 UNIT/ML injection Inject 12 Units into the skin 3 (three) times daily.   . insulin glargine (LANTUS) 100 UNIT/ML injection Inject 0.25 mLs (25 Units total) into the skin at bedtime.  Marland Kitchen levocetirizine (XYZAL) 5 MG tablet Take 5 mg by mouth every evening.  . metoprolol (LOPRESSOR) 50 MG tablet Take 50 mg by mouth daily.   Marland Kitchen omeprazole (PRILOSEC) 20 MG capsule Take 20 mg by mouth daily.  . pioglitazone (ACTOS) 15 MG tablet Take 15 mg by mouth daily.  . predniSONE (DELTASONE) 5 MG tablet Take 2.5 mg by mouth daily.   Marland Kitchen ibuprofen (ADVIL,MOTRIN) 800 MG tablet Take 1  tablet (800 mg total) by mouth every 8 (eight) hours as needed. (Patient taking differently: Take 800 mg by mouth every 8 (eight) hours as needed for headache or moderate pain. )  . LORazepam (ATIVAN) 0.5 MG tablet Take 1 tablet (0.5 mg total) by mouth every 8 (eight) hours as needed for anxiety. (Patient not taking: Reported on 08/20/2016)    FAMILY  HISTORY:  Her indicated that her mother is deceased. She indicated that her father is deceased.    SOCIAL HISTORY: She  reports that she has quit smoking. She quit after 45.00 years of use. She has never used smokeless tobacco. She reports that she does not drink alcohol or use drugs.  REVIEW OF SYSTEMS:   Unable, pt sedated on vent.   SUBJECTIVE: Stable on vent.  EEG with diffuse cerebral dysfunction Myoclonic jerks.   VITAL SIGNS: BP 135/63   Pulse 78   Temp 98.1 F (36.7 C)   Resp (!) 24   Ht 5\' 6"  (1.676 m)   Wt 216 lb 7.9 oz (98.2 kg)   SpO2 100%   BMI 34.94 kg/m   HEMODYNAMICS:    VENTILATOR SETTINGS: Vent Mode: PRVC FiO2 (%):  [50 %-100 %] 50 % Set Rate:  [30 bmp] 30 bmp Vt Set:  [360 mL] 360 mL PEEP:  [5 cmH20-10 cmH20] 5 cmH20 Plateau Pressure:  [15 cmH20-27 cmH20] 22 cmH20  INTAKE / OUTPUT: I/O last 3 completed shifts: In: 2397.7 [I.V.:1947.7; IV Piggyback:450] Out: [id number] [Urine:810; Emesis/NG output:60]  PHYSICAL EXAMINATION: General:  Chronically ill appearing female, NAD  Neuro:  Unresponsive, pupils 63mm sluggish HEENT:  Moist mucus membranes Cardiovascular:  S1, S2, No MRG Lungs:  resps even non labored on vent, coarse  Abdomen:  Round, mildly distended, rentention sutures in place, appear old, mild swelling erythema lower abd, hypoactive bs  Musculoskeletal:  Cool, clammy, no sig edema   LABS:  BMET  Recent Labs Lab 08/01/2016 0350 08/15/2016 0359 08/24/16 0151  NA 139 140 143  K 4.7 4.6 4.4  CL 109 111 114*  CO2 14*  --  20*  BUN 27* 34* 42*  CREATININE 2.29* 2.10* 2.59*  GLUCOSE 369* 357* 146*    Electrolytes  Recent Labs Lab 08/05/2016 0350 08/24/16 0151  CALCIUM 8.4* 8.8*  MG 1.7 1.4*  PHOS  --  4.2    CBC  Recent Labs Lab 07/31/2016 0350 08/05/2016 0359 08/24/16 0151  WBC 19.4*  --  22.4*  HGB 9.3* 9.9* 8.2*  HCT 30.4* 29.0* 25.6*  PLT 307  --  232    Coag's No results for input(s): APTT, INR in the last 168  hours.  Sepsis Markers  Recent Labs Lab 08/20/2016 0400 08/08/2016 0647 08/14/2016 0915 08/24/16 0151  LATICACIDVEN 8.55* 4.07* 4.3*  --   PROCALCITON  --   --  0.47 7.05    ABG  Recent Labs Lab 07/30/2016 0453 07/29/2016 0645 08/12/2016 0800  PHART 7.130* 7.295* 7.372  PCO2ART 56.5* 38.5 28.4*  PO2ART 91.0 65.0* 230*    Liver Enzymes  Recent Labs Lab 08/17/2016 0350  AST 71*  ALT 25  ALKPHOS 98  BILITOT 0.6  ALBUMIN 3.1*    Cardiac Enzymes  Recent Labs Lab 08/09/2016 0350  TROPONINI 0.09*    Glucose  Recent Labs Lab 08/09/2016 2352 08/24/16 0051 08/24/16 0153 08/24/16 0303 08/24/16 0401 08/24/16 0755  GLUCAP 95 110* 135* 161* 174* 198*    Imaging No results found.   STUDIES:  CT abd/pelvis 10/30>>> 1. Dense  consolidation of the central lower lobes of both lungs, right worse than left. Likely pneumonia. Small pleural effusions Bilaterally.  2. Probable small abdominal wall abscess in the midline supraumbilical region measuring less than 2 cm. There also are changes suggesting cellulitis in the subcutaneous fat of the right lower quadrant corresponding to the site of the prior ostomy. 3. No bowel obstruction. CT head 10/30>>>  CULTURES: Sputum 10/30>>> BC x 2 10/30>>> Urine 10/30>>>  ANTIBIOTICS: Vanc 10/30>>> Imipenem 10/30>>> anidulafungin 10/30>>>  SIGNIFICANT EVENTS:   LINES/TUBES: ETT 10/30>>>  DISCUSSION: PEA arrest with prolonged downtime. Now with anoxic injury.  ASSESSMENT / PLAN:  PULMONARY Acute respiratory failure  ?HCAP  ARDS  P:   Vent support - 6cc/kg  ARDS protocol  Wean down Fio2 and PEEP  Empiric abx as above   CARDIOVASCULAR PEA arrest  Hx CAD  CHF  P:  No hypothermia protocol r/t sepsis, concern for significant infection  EKG  Echo  Trend troponin  Hold home lasix, norvasc, hctz, lisinopril, metoprolol  Low dose stress steroids for relative adrenal insuff- chronic pred (unclear etiology)  RENAL AKI  on CKD - baseline Scr ~ 1.9 Raising Cr.  Not a candidate for dialysis Mild AG acidosis  P:   Lasix 40 mg once today.  F/u chem   GASTROINTESTINAL Recent ileostomy takedown  ?abd wall abscess  P:   abx as above  No acute surgical issues Start tube feeds.  Pepcid.  HEMATOLOGIC Anemia - mild  P:  SQ heparin  F/u CBC   INFECTIOUS ?intra-abd infection  ?HCAP  abd cellulitis  P:   Broad spectrum abx as above - vanc, imipenem, anidulafungin  Pan culture  F/u lactate, pct   ENDOCRINE DM  P:   ICU hyperglycemia protocol   NEUROLOGIC AMS - post prolonged PEA arrest  Myoclonic jerks P:   RASS goal: -1 Propofol drip and versed prn  FAMILY  - Updates:  Family updated daily. See plan of care note. - Inter-disciplinary family meet or Palliative Care meeting due by:  11/6  Critical care time- 45 mins.  Marshell Garfinkel MD [facility] Pager [phone] If no answer or after 3pm call: [phone] 08/24/2016, 9:36 AM

## 2016-08-24 NOTE — Progress Notes (Signed)
[facility]  Patient Status: Active (receiving services up to time of hospitalization)  [facility] is providing the following services: RN  If patient discharges after hours, please call [phone].   [name] 08/24/2016, 10:32 AM

## 2016-08-24 NOTE — Progress Notes (Signed)
Subjective: Patient intubated - myoclonic movements Unresponsive - EEG showed significant neurologic injury  Objective: Vital signs in last 24 hours: Temp:  [90.2 F (32.3 C)-99.9 F (37.7 C)] 98.2 F (36.8 C) (10/31 0600) Pulse Rate:  [82-117] 117 (10/31 0329) Resp:  [6-36] 6 (10/31 0600) BP: (109-185)/(56-113) 129/61 (10/31 0600) SpO2:  [98 %-100 %] 100 % (10/31 0600) FiO2 (%):  [50 %-100 %] 50 % (10/31 0329) Weight:  [98.2 kg (216 lb 7.9 oz)] 98.2 kg (216 lb 7.9 oz) (10/31 0500)    Intake/Output from previous day: 10/30 0701 - 10/31 0700 In: 1902.4 [I.V.:1652.4; IV Piggyback:250] Out: 870 [Urine:810; Emesis/NG output:60] Intake/Output this shift: No intake/output data recorded.  General appearance: intubated, unresponsive Myoclonic movements with some spontaneous movement of eyelids - not purposeful or responsive to stimuli Abd - soft, no sign of tenderness; no erythema; no drainage  Lab Results:   Recent Labs  08/09/2016 0350 08/14/2016 0359 08/24/16 0151  WBC 19.4*  --  22.4*  HGB 9.3* 9.9* 8.2*  HCT 30.4* 29.0* 25.6*  PLT 307  --  232   BMET  Recent Labs  08/02/2016 0350 07/31/2016 0359 08/24/16 0151  NA 139 140 143  K 4.7 4.6 4.4  CL 109 111 114*  CO2 14*  --  20*  GLUCOSE 369* 357* 146*  BUN 27* 34* 42*  CREATININE 2.29* 2.10* 2.59*  CALCIUM 8.4*  --  8.8*   PT/INR No results for input(s): LABPROT, INR in the last 72 hours. ABG  Recent Labs  08/13/2016 0645 08/05/2016 0800  PHART 7.295* 7.372  HCO3 19.8* 18.2*    Studies/Results: Ct Abdomen Pelvis Wo Contrast  Result Date: 08/03/2016 CLINICAL DATA:  Abdominal wall abscess. EXAM: CT ABDOMEN AND PELVIS WITHOUT CONTRAST TECHNIQUE: Multidetector CT imaging of the abdomen and pelvis was performed following the standard protocol without IV contrast. COMPARISON:  06/16/2016 FINDINGS: Lower chest: Dense consolidation of the central lower lobes, right greater than left. This most likely represents  pneumonia. Small effusions. Hepatobiliary: No focal liver abnormality is seen. No gallstones, gallbladder wall thickening, or biliary dilatation. Pancreas: Unremarkable. No pancreatic ductal dilatation or surrounding inflammatory changes. Spleen: Normal in size without focal abnormality. Adrenals/Urinary Tract: Adrenal glands are unremarkable. Kidneys are normal, without renal calculi, focal lesion, or hydronephrosis. Bladder is decompressed around a Foley catheter. Stomach/Bowel: Nasogastric tube extends to the stomach. Stomach and small bowel are not obstructed. A loop of small bowel appears to protrude slightly through the right lower quadrant abdominal wall at the location of the prior ostomy. The colon is unobstructed. The sigmoid anastomosis appears unchanged. There is a small focal extension of air beyond the bowel contour at the sigmoid anastomosis corresponding to the focal irregularity observed on 07/14/2016 barium enema; no fistula or drainable abscess is evident at this location. Vascular/Lymphatic: No significant vascular findings are present. No enlarged abdominal or pelvic lymph nodes. Reproductive: Status post hysterectomy. No adnexal masses. Other: Focal extraluminal air in the midline incision just above the level of the umbilicus, likely representing a small abscess measuring less than 2 cm, within the anterior abdominal wall. This is visible on image 65 series 2. There also is subcutaneous inflammatory change in the right lower quadrant at the site of the prior ostomy; this may represent cellulitis without significant drainable abscess. No ascites.  No free peritoneal air. Musculoskeletal: No acute or significant osseous findings. IMPRESSION: 1. Dense consolidation of the central lower lobes of both lungs, right worse than left. Likely pneumonia. Small pleural  effusions bilaterally. 2. Probable small abdominal wall abscess in the midline supraumbilical region measuring less than 2 cm. There also  are changes suggesting cellulitis in the subcutaneous fat of the right lower quadrant corresponding to the site of the prior ostomy. 3. No bowel obstruction. Electronically Signed   By: Andreas Newport M.D.   On: 08/22/2016 06:34   Ct Head Wo Contrast  Result Date: 07/26/2016 CLINICAL DATA:  Post cardiac arrest and recent colostomy reversal, history diabetes mellitus, coronary artery disease, hypertension, CHF EXAM: CT HEAD WITHOUT CONTRAST TECHNIQUE: Contiguous axial images were obtained from the base of the skull through the vertex without intravenous contrast. COMPARISON:  None FINDINGS: Brain: Normal ventricular morphology. No midline shift or mass effect. Normal appearance of brain parenchyma. No intracranial hemorrhage, mass lesion, evidence of acute infarction, or extra-axial fluid collection. Vascular: Atherosclerotic calcifications at the carotid siphons bilaterally Skull: Intact Sinuses/Orbits: Minimal mucosal thickening in ethmoid air cells. Tiny osteoma LEFT frontal sinus. Other: N/A IMPRESSION: No acute intracranial abnormalities. Electronically Signed   By: Lavonia Dana M.D.   On: 08/21/2016 08:05   Nm Pulmonary Perfusion  Result Date: 07/31/2016 CLINICAL DATA:  Shortness of breath. EXAM: NUCLEAR MEDICINE PERFUSION SCAN TECHNIQUE: Perfusion images were obtained in multiple projections after intravenous injection of radiopharmaceutical. RADIOPHARMACEUTICALS:  3.1 MCi Tc53m MAA IV COMPARISON:  Chest x-ray 08/15/2016. FINDINGS: Perfusion: No wedge shaped peripheral perfusion defects to suggest acute pulmonary embolism. IMPRESSION: Normal exam. Electronically Signed   By: Marcello Moores  Register   On: 07/28/2016 08:39   Dg Chest Port 1 View  Result Date: 08/18/2016 CLINICAL DATA:  Respiratory failure.  Intubation. EXAM: PORTABLE CHEST 1 VIEW COMPARISON:  06/15/2016 FINDINGS: Endotracheal tube is 3.4 cm above the carina. Nasogastric tube extends into the stomach and beyond the inferior edge of the  image. There is dense consolidation in the central lungs bilaterally. No pneumothorax. IMPRESSION: 1. Satisfactorily positioned support equipment. 2. Dense airspace consolidation in the central lungs bilaterally. This could represent alveolar edema, infectious infiltrate, pulmonary hemorrhage. Electronically Signed   By: Andreas Newport M.D.   On: 08/02/2016 04:21    Anti-infectives: Anti-infectives    Start     Dose/Rate Route Frequency Ordered Stop   08/24/16 0900  anidulafungin (ERAXIS) 100 mg in sodium chloride 0.9 % 100 mL IVPB     100 mg over 90 Minutes Intravenous Every 24 hours 08/06/2016 0726     08/24/16 0900  vancomycin (VANCOCIN) IVPB 1000 mg/200 mL premix     1,000 mg 200 mL/hr over 60 Minutes Intravenous Every 24 hours 07/29/2016 0901     07/26/2016 1200  meropenem (MERREM) 1 g in sodium chloride 0.9 % 100 mL IVPB     1 g 200 mL/hr over 30 Minutes Intravenous Every 12 hours 08/09/2016 0901     08/04/2016 0830  anidulafungin (ERAXIS) 200 mg in sodium chloride 0.9 % 200 mL IVPB     200 mg over 180 Minutes Intravenous  Once 08/14/2016 0740 08/24/2016 1410   07/25/2016 0800  vancomycin (VANCOCIN) IVPB 1000 mg/200 mL premix     1,000 mg 200 mL/hr over 60 Minutes Intravenous  Once 08/18/2016 0742 08/24/2016 1135   08/14/2016 0630  imipenem-cilastatin (PRIMAXIN) 500 mg in sodium chloride 0.9 % 100 mL IVPB     500 mg 200 mL/hr over 30 Minutes Intravenous  Once 07/28/2016 0626 07/28/2016 0732   08/19/2016 0400  vancomycin (VANCOCIN) IVPB 1000 mg/200 mL premix     1,000 mg 200 mL/hr over 60 Minutes  Intravenous  Once 08/12/2016 0349 08/24/2016 0536      Assessment/Plan: No acute intra-abdominal processes Poor neurologic prognosis Will sign off for now, but call us back if neuro status improves and there are signs of intra-abdominal processes.  LOS: 1 day    Adylene Dlugosz K. 08/24/2016

## 2016-08-24 NOTE — Progress Notes (Signed)
[facility] Progress Note Patient Name: [name] DOB: 09-05-1946 MRN: [id number]   Date of Service  08/24/2016  HPI/Events of Note  RN calls as pt's CBG > 250mg % x 2 times on sensitive scale. Insulin drip was discontinued this morning. Tube feedings were also started this afternoon.   Creatinine elevated this morning.   eICU Interventions  Will switch sliding scale to standard scale to give more coverage. If sugars persistently high, we'll start insulin drip per protocol.      Intervention Category Intermediate Interventions: Electrolyte abnormality - evaluation and management  [place] 08/24/2016, 11:27 PM

## 2016-08-24 NOTE — Progress Notes (Signed)
Echocardiogram 2D Echocardiogram has been performed.  [name] 08/24/2016, 11:22 AM

## 2016-08-24 NOTE — Progress Notes (Signed)
[facility] Progress Note Patient Name: [name] DOB: 1946/02/27 MRN: [id number]   Date of Service  08/24/2016  HPI/Events of Note  Nurse calls because of myoclonic jerks. Patient with prolonged arrest. Poor prognosis. Awaiting for rest of family to arrive today.  Blood pressure 140/60, heart rate 120, respiratory rate 30, sats 100%.  Patient seen. With episodes of myoclonic jerks.   eICU Interventions  Will try Versed pushes.  If jerks not better on the Versed pushes, we'll try propofol drip.  Looking in the big picture, we need to address CODE STATUS once everyone has seen the patient.         [place] 08/24/2016, 3:30 AM

## 2016-08-24 NOTE — Progress Notes (Signed)
Initial Nutrition Assessment  DOCUMENTATION CODES:   Obesity unspecified  INTERVENTION:   - Initiate Vital High Protein @ 25 mL/hr (600 mL) to provide 600 kcal, 53 grams protein, and 504 mL free water daily. - Initiate 30 mL Pro-stat 5 times daily to provide 500 kcal and 75 grams protein. - With propofol, TF regimen provides 1412 kcal (109% estimated energy needs), 128 grams protein (108% estimated protein needs), and 504 mL free water daily. - Liquid MVI with minerals daily as TF regimen does not meet 100% RDIs.  NUTRITION DIAGNOSIS:   Inadequate oral intake related to inability to eat as evidenced by NPO status.  GOAL:   Provide needs based on ASPEN/SCCM guidelines  MONITOR:   Vent status, Labs, Weight trends, TF tolerance  REASON FOR ASSESSMENT:   Consult Enteral/tube feeding initiation and management  ASSESSMENT:   70yo female with hx CHF, DM, HTN, breast cancer with recent ileostomy takedown 07/15/16 presented 10/30 after PEA arrest at home. In ER she remained bradycardic and unresponsive but hemodynamically stable, did not require pressors.  Spoke with pt's family briefly at bedside. Family reports pt ate well and was "aware of her diabetes" PTA.  Started on the ARDS protocol.  Pt is currently intubated on ventilator support. Pt not receiving hypothermia protocol r/t sepsis and concern for infection. MV: 11.0 L/min Highest temperature in 24 hours: 37.7 degrees Celsius BP (MAP): 128/57 (78)  Propofol infusing at 11.8 mL/hr which provides 312 kcal daily.  Per imaging results, NGT extends to the stomach.  Per chart, pt has lost 9.2% body weight in past year (insignificant for timeframe).  Medications reviewed and include 20 mg Pepcid BID, 5,000 units heparin TID, sliding scale Novolog, 10 units Lantus daily, continuous Novolin  Labs reviewed and include elevated BUN (42 mg/dL), elevated creatinine (2.59 mg/dL), low calcium (8.8 mg/dL), low magnesium (1.4 mg/dL),  low hemoglobin (8.2 g/dL) CBGs: 135-210 mg/dL  NFPE: Exam completed. No fat depletion, no muscle depletion, and mild (non-pitting) edema noted.  Diet Order:  Diet NPO time specified  Skin:  Reviewed, no issues (closed surgical incision to abdomen)  Last BM:  08/24/16  Height:   Ht Readings from Last 1 Encounters:  07/28/2016 5\' 6"  (1.676 m)    Weight:   Wt Readings from Last 1 Encounters:  08/24/16 216 lb 7.9 oz (98.2 kg)    Ideal Body Weight:  59.1 kg  BMI:  Body mass index is 34.94 kg/m.  Estimated Nutritional Needs:   Kcal:  1100-1300 (11-14 kcal/kg)  Protein:  >/= 118 grams (2.0 g/kg IBW)  Fluid:  1.5-1.7 L/day  EDUCATION NEEDS:   No education needs identified at this time  Jeb Levering Dietetic Intern Pager Number: [phone]

## 2016-08-25 ENCOUNTER — Inpatient Hospital Stay (HOSPITAL_COMMUNITY): Payer: Commercial Managed Care - HMO

## 2016-08-25 DIAGNOSIS — J9600 Acute respiratory failure, unspecified whether with hypoxia or hypercapnia: Secondary | ICD-10-CM

## 2016-08-25 LAB — GLUCOSE, CAPILLARY
GLUCOSE-CAPILLARY: 293 mg/dL — AB (ref 65–99)
GLUCOSE-CAPILLARY: 269 mg/dL — AB (ref 65–99)
GLUCOSE-CAPILLARY: 217 mg/dL — AB (ref 65–99)
Glucose-Capillary: 278 mg/dL — ABNORMAL HIGH (ref 65–99)
Glucose-Capillary: 309 mg/dL — ABNORMAL HIGH (ref 65–99)
Glucose-Capillary: 200 mg/dL — ABNORMAL HIGH (ref 65–99)

## 2016-08-25 LAB — BASIC METABOLIC PANEL
Anion gap: 12 (ref 5–15)
BUN: 53 mg/dL — AB (ref 6–20)
CALCIUM: 9.2 mg/dL (ref 8.9–10.3)
CO2: 20 mmol/L — ABNORMAL LOW (ref 22–32)
CREATININE: 3.2 mg/dL — AB (ref 0.44–1.00)
Chloride: 109 mmol/L (ref 101–111)
GFR calc non Af Amer: 14 mL/min — ABNORMAL LOW (ref >60)
GFR, EST AFRICAN AMERICAN: 16 mL/min — AB (ref >60)
Glucose, Bld: 307 mg/dL — ABNORMAL HIGH (ref 65–99)
Potassium: 4.2 mmol/L (ref 3.5–5.1)
SODIUM: 141 mmol/L (ref 135–145)

## 2016-08-25 LAB — CBC
HCT: 26.8 % — ABNORMAL LOW (ref 36.0–46.0)
Hemoglobin: 8.7 g/dL — ABNORMAL LOW (ref 12.0–15.0)
MCH: 28.1 pg (ref 26.0–34.0)
MCHC: 32.5 g/dL (ref 30.0–36.0)
MCV: 86.5 fL (ref 78.0–100.0)
PLATELETS: 263 10*3/uL (ref 150–400)
RBC: 3.1 MIL/uL — ABNORMAL LOW (ref 3.87–5.11)
RDW: 15.8 % — AB (ref 11.5–15.5)
WBC: 24.7 10*3/uL — ABNORMAL HIGH (ref 4.0–10.5)

## 2016-08-25 LAB — BLOOD GAS, ARTERIAL
ACID-BASE DEFICIT: 3.1 mmol/L — AB (ref 0.0–2.0)
Bicarbonate: 20.6 mmol/L (ref 20.0–28.0)
DRAWN BY: 42624
FIO2: 0.4
MECHVT: 360 mL
O2 Saturation: 99.4 %
PEEP: 5 cmH2O
Patient temperature: 98.6
RATE: 30 resp/min
pCO2 arterial: 32 mmHg (ref 32.0–48.0)
pH, Arterial: 7.425 (ref 7.350–7.450)
pO2, Arterial: 176 mmHg — ABNORMAL HIGH (ref 83.0–108.0)

## 2016-08-25 LAB — PHOSPHORUS: PHOSPHORUS: 4.8 mg/dL — AB (ref 2.5–4.6)

## 2016-08-25 LAB — PROCALCITONIN: PROCALCITONIN: 6.58 ng/mL

## 2016-08-25 LAB — MAGNESIUM: MAGNESIUM: 1.7 mg/dL (ref 1.7–2.4)

## 2016-08-25 MED ORDER — PREDNISONE 2.5 MG PO TABS
2.5000 mg | ORAL_TABLET | Freq: Every day | ORAL | Status: DC
  Administered 2016-08-25 – 2016-08-31 (×7): 2.5 mg via ORAL
  Filled 2016-08-25 (×7): qty 1

## 2016-08-25 MED ORDER — AMLODIPINE BESYLATE 10 MG PO TABS
10.0000 mg | ORAL_TABLET | Freq: Every day | ORAL | Status: DC
  Administered 2016-08-25 – 2016-08-31 (×7): 10 mg via ORAL
  Filled 2016-08-25 (×8): qty 1

## 2016-08-25 MED ORDER — HYDRALAZINE HCL 20 MG/ML IJ SOLN
10.0000 mg | INTRAMUSCULAR | Status: DC | PRN
  Administered 2016-08-25 – 2016-08-29 (×6): 10 mg via INTRAVENOUS
  Filled 2016-08-25 (×7): qty 1

## 2016-08-25 MED ORDER — INSULIN ASPART 100 UNIT/ML ~~LOC~~ SOLN
0.0000 [IU] | SUBCUTANEOUS | Status: DC
  Administered 2016-08-25: 11 [IU] via SUBCUTANEOUS
  Administered 2016-08-25: 3 [IU] via SUBCUTANEOUS
  Administered 2016-08-25: 8 [IU] via SUBCUTANEOUS
  Administered 2016-08-25: 5 [IU] via SUBCUTANEOUS

## 2016-08-25 NOTE — Progress Notes (Signed)
Pharmacy Antibiotic Note  VIDHI AYLES is a 70 y.o. female admitted on 08/02/2016 after cardiac arrest. She received CPR in the field for about 15 minutes with ROSC. Of note pt had a recent ileostomy take down in September. Her lactic acid is elevated, as expected s/p CPR, and is downtrending.   Scr rising today, estimated CrCl ~ 20 ml/min.  Already received today's dose of vancomycin.  Cultures negative so far.  Meropenem and Eraxis doses remain appropriate for renal function.  Goal vancomycin trough 15-20 mcg/mL  Plan: Hold vancomycin for now, check trough level tomorrow AM. Meropenem 1 g IV q12h Eraxis 100 mg IV q24h    Height: 5\' 6"  (167.6 cm) Weight: 214 lb 1.1 oz (97.1 kg) IBW/kg (Calculated) : 59.3  Temp (24hrs), Avg:99.3 F (37.4 C), Min:97.9 F (36.6 C), Max:100.2 F (37.9 C)   Recent Labs Lab 08/24/2016 0350 07/27/2016 0359 08/20/2016 0400 07/28/2016 0647 08/21/2016 0915 08/24/16 0151 08/25/16 0227  WBC 19.4*  --   --   --   --  22.4* 24.7*  CREATININE 2.29* 2.10*  --   --   --  2.59* 3.20*  LATICACIDVEN  --   --  8.55* 4.07* 4.3*  --   --     Estimated Creatinine Clearance: 19.2 mL/min (by C-G formula based on SCr of 3.2 mg/dL (H)).     Antimicrobials this admission: 10/30 vancomycin >> 10/30 meroepnem >> 10/30 anidulafungin >> 10/30 Primaxin x1  Dose adjustments this admission: NA  Microbiology results: 10/30 urine cx: neg 10/30 blood cx: ngtd  Thank you for allowing pharmacy to be a part of this patient's care.  Uvaldo Rising, BCPS  Clinical Pharmacist Pager [phone]  08/25/2016 10:28 AM

## 2016-08-25 NOTE — Progress Notes (Addendum)
PULMONARY / CRITICAL CARE MEDICINE   Name: [name] MRN: [id number] DOB: [birth year]    ADMISSION DATE:  08/13/2016  REFERRING MD:  Randal Buba (EDP)   CHIEF COMPLAINT:  PEA arrest   HISTORY OF PRESENT ILLNESS:   70yo female with hx CHF, DM, HTN, breast cancer with recent ileostomy takedown 07/15/16 presented 10/30 after PEA arrest at home.  She called EMS for sudden onset SOB.  When fire arrived she was apneic and pulseless.  She had ~30-40 mins CPR with epi x 3 prior to ROSC.   In ER she remained bradycardic but hemodynamically stable, did not require pressors.  Remained essentially unresponsive without purposeful movements. PCCM called to admit.   PAST MEDICAL HISTORY :  She  has a past medical history of Arthritis; Cancer [facility]); CHF (congestive heart failure) ([place]); Chronic renal insufficiency (February 07, 2015); Coronary artery dilation; Diabetes mellitus without complication ([place]); Diverticulitis; Hyperlipidemia; Hypertension; Renal insufficiency; Sinus problem; Thyroid disease; and Ulcer of left lower leg [facility]) (February 07, 2015).  PAST SURGICAL HISTORY: She  has a past surgical history that includes Abdominal hysterectomy; Dilation and curettage of uterus (1984); Partial hysterectomy (1985); Rotator cuff repair (2004); Cyst excision; Cataract extraction w/PHACO (Right, 03/11/2015); I&D extremity (Right, 09/04/2015); Colon resection sigmoid (N/A, 06/09/2016); Colostomy (N/A, 06/09/2016); Central venous catheter insertion (Right, 06/09/2016); and Ileostomy closure (N/A, 07/15/2016).  Allergies  Allergen Reactions  . Doxycycline Other (See Comments)    Reaction: unknown  . Levaquin [Levofloxacin] Other (See Comments)    Causes joint pain  . Penicillins Rash and Other (See Comments)    Has patient had a PCN reaction causing immediate rash, facial/tongue/throat swelling, SOB or lightheadedness with hypotension: Yes Has patient had a PCN reaction causing severe rash involving mucus membranes or  skin necrosis: No Has patient had a PCN reaction that required hospitalization No Has patient had a PCN reaction occurring within the last 10 years: No If all of the above answers are "NO", then may proceed with Cephalosporin use.  . Sulfa Antibiotics Swelling and Rash    No current facility-administered medications on file prior to encounter.    Current Outpatient Prescriptions on File Prior to Encounter  Medication Sig  . acetaminophen (TYLENOL) 500 MG tablet Take 500 mg by mouth every 6 (six) hours as needed for mild pain, fever or headache.   Marland Kitchen amLODipine (NORVASC) 10 MG tablet Take 10 mg by mouth daily.  Marland Kitchen aspirin 81 MG tablet Take 81 mg by mouth daily.   Marland Kitchen atorvastatin (LIPITOR) 40 MG tablet Take 40 mg by mouth daily.  Marland Kitchen gabapentin (NEURONTIN) 100 MG capsule Take 100 mg by mouth 3 (three) times daily.  . hydrALAZINE (APRESOLINE) 25 MG tablet Take 25 mg by mouth daily as needed. Take another 25mg  if Systolic consistently over [id number] or dystolic over 90   . insulin aspart (NOVOLOG) 100 UNIT/ML injection Inject 12 Units into the skin 3 (three) times daily.   . insulin glargine (LANTUS) 100 UNIT/ML injection Inject 0.25 mLs (25 Units total) into the skin at bedtime.  Marland Kitchen levocetirizine (XYZAL) 5 MG tablet Take 5 mg by mouth every evening.  . metoprolol (LOPRESSOR) 50 MG tablet Take 50 mg by mouth daily.   Marland Kitchen omeprazole (PRILOSEC) 20 MG capsule Take 20 mg by mouth daily.  . pioglitazone (ACTOS) 15 MG tablet Take 15 mg by mouth daily.  . predniSONE (DELTASONE) 5 MG tablet Take 2.5 mg by mouth daily.   Marland Kitchen ibuprofen (ADVIL,MOTRIN) 800 MG tablet Take 1  tablet (800 mg total) by mouth every 8 (eight) hours as needed. (Patient taking differently: Take 800 mg by mouth every 8 (eight) hours as needed for headache or moderate pain. )  . LORazepam (ATIVAN) 0.5 MG tablet Take 1 tablet (0.5 mg total) by mouth every 8 (eight) hours as needed for anxiety. (Patient not taking: Reported on 08/10/2016)    FAMILY  HISTORY:  Her indicated that her mother is deceased. She indicated that her father is deceased.    SOCIAL HISTORY: She  reports that she has quit smoking. She quit after 45.00 years of use. She has never used smokeless tobacco. She reports that she does not drink alcohol or use drugs.  REVIEW OF SYSTEMS:   Unable, pt sedated on vent.   SUBJECTIVE: Stable on vent.  Myoclonic jerks.  Worsening renal failure.  VITAL SIGNS: BP (!) 173/79   Pulse 100   Temp 100 F (37.8 C)   Resp (!) 28   Ht 5\' 6"  (1.676 m)   Wt 214 lb 1.1 oz (97.1 kg)   SpO2 100%   BMI 34.55 kg/m   HEMODYNAMICS:    VENTILATOR SETTINGS: Vent Mode: PRVC FiO2 (%):  [40 %-50 %] 40 % Set Rate:  [30 bmp] 30 bmp Vt Set:  [360 mL] 360 mL PEEP:  [5 cmH20] 5 cmH20 Plateau Pressure:  [18 cmH20-21 cmH20] 21 cmH20  INTAKE / OUTPUT: I/O last 3 completed shifts: In: 3166.4 [I.V.:1973.9; NG/GT:312.5; IV Piggyback:880] Out: 2900 [Urine:2840; Emesis/NG output:60]  PHYSICAL EXAMINATION: General:  Chronically ill appearing female, NAD  Neuro:  Unresponsive, pupils 38mm sluggish HEENT:  Moist mucus membranes Cardiovascular:  S1, S2, No MRG Lungs:  resps even non labored on vent, coarse  Abdomen:  Round, mildly distended, rentention sutures in place, appear old, mild swelling erythema lower abd, hypoactive bs  Musculoskeletal:  Cool, clammy, no sig edema   LABS:  BMET  Recent Labs Lab 08/04/2016 0350 08/12/2016 0359 08/24/16 0151 08/25/16 0227  NA 139 140 143 141  K 4.7 4.6 4.4 4.2  CL 109 111 114* 109  CO2 14*  --  20* 20*  BUN 27* 34* 42* 53*  CREATININE 2.29* 2.10* 2.59* 3.20*  GLUCOSE 369* 357* 146* 307*    Electrolytes  Recent Labs Lab 08/05/2016 0350 08/24/16 0151 08/25/16 0227  CALCIUM 8.4* 8.8* 9.2  MG 1.7 1.4* 1.7  PHOS  --  4.2 4.8*    CBC  Recent Labs Lab 07/27/2016 0350 08/18/2016 0359 08/24/16 0151 08/25/16 0227  WBC 19.4*  --  22.4* 24.7*  HGB 9.3* 9.9* 8.2* 8.7*  HCT 30.4* 29.0*  25.6* 26.8*  PLT 307  --  232 263    Coag's No results for input(s): APTT, INR in the last 168 hours.  Sepsis Markers  Recent Labs Lab 08/22/2016 0400 08/12/2016 0647 08/04/2016 0915 08/24/16 0151 08/25/16 0227  LATICACIDVEN 8.55* 4.07* 4.3*  --   --   PROCALCITON  --   --  0.47 7.05 6.58    ABG  Recent Labs Lab 08/10/2016 0645 08/19/2016 0800 08/25/16 0434  PHART 7.295* 7.372 7.425  PCO2ART 38.5 28.4* 32.0  PO2ART 65.0* 230* 176*    Liver Enzymes  Recent Labs Lab 08/02/2016 0350  AST 71*  ALT 25  ALKPHOS 98  BILITOT 0.6  ALBUMIN 3.1*    Cardiac Enzymes  Recent Labs Lab 07/27/2016 0350  TROPONINI 0.09*    Glucose  Recent Labs Lab 08/24/16 1623 08/24/16 1938 08/24/16 2319 08/25/16 0206 08/25/16 0339 08/25/16  0813  GLUCAP 243* 202* 274* 278* 293* 269*    Imaging Dg Chest Port 1 View  Result Date: 08/25/2016 CLINICAL DATA:  Acute respiratory failure. EXAM: PORTABLE CHEST 1 VIEW COMPARISON:  Chest x-ray 08/03/2016.  CT scan 08/22/2016. FINDINGS: Endotracheal tube and NG tube in stable position. Cardiomegaly with persistent but significant improved bilateral pulmonary infiltrates consistent with improving pulmonary edema. Small left pleural effusion. No pneumothorax. IMPRESSION: 1. Lines and tubes in stable position. 2. Persistent but significant improvement of bilateral pulmonary edema. Electronically Signed   By: [place]   On: 08/25/2016 07:00     STUDIES:  CT abd/pelvis 10/30>>> 1. Dense consolidation of the central lower lobes of both lungs, right worse than left. Likely pneumonia. Small pleural effusions Bilaterally.  2. Probable small abdominal wall abscess in the midline supraumbilical region measuring less than 2 cm. There also are changes suggesting cellulitis in the subcutaneous fat of the right lower quadrant corresponding to the site of the prior ostomy. 3. No bowel obstruction. CT head 10/30>>>  CULTURES: BC x 2 10/30>>> No  growth Urine 10/30>>> no growth  ANTIBIOTICS: Vanc 10/30>>> Imipenem 10/30>>> anidulafungin 10/30>>>  SIGNIFICANT EVENTS:  LINES/TUBES: ETT 10/30>>>  DISCUSSION: PEA arrest with prolonged downtime. Now with anoxic injury.  ASSESSMENT / PLAN:  PULMONARY Acute respiratory failure  ?HCAP  ARDS  P:   Vent support - 6cc/kg  ARDS protocol  Wean down Fio2 and PEEP  Empiric abx as above   CARDIOVASCULAR PEA arrest  Hx CAD  CHF  P:  No hypothermia protocol r/t sepsis, concern for significant infection  Hold home lasix, hctz, lisinopril, metoprolol  Restart norvasc, add hydralazine PRN for HTN Stress steroids for relative adrenal insuff- chronic pred (unclear etiology) > change back to home prednisone dose.   RENAL AKI on CKD - baseline Scr ~ 1.9 Raising Cr.  Not a candidate for dialysis Mild AG acidosis  P:   Hold lasix as Cr is higher.  F/u chem   GASTROINTESTINAL Recent ileostomy takedown  ?abd wall abscess  P:   Abx as above  No acute surgical issues On tube feeds.  Pepcid.  HEMATOLOGIC Anemia - mild  P:  SQ heparin  F/u CBC   INFECTIOUS ?intra-abd infection  ?HCAP  abd cellulitis  P:   Broad spectrum abx as above - vanc, imipenem, anidulafungin  Pan culture  F/u lactate, pct   ENDOCRINE DM  P:   Insulin drip transitioned to SSI Lantus Reduce steroid dose to baseline prednisone  NEUROLOGIC AMS - post prolonged PEA arrest  Myoclonic jerks P:   RASS goal: -1 Propofol drip and versed prn  FAMILY  - Updates:  Family updated daily. They are clearly struggling to come to terms with the prognosis. They lost their father earlier this year and are still grieving. We are awaiting arrival of a son from [place]. Till then they want her to be full code and said "do one more round of resuscitation if she goes into cardiac arrest" - Inter-disciplinary family meet or Palliative Care meeting due by:  Done on admission.  Critical care time- 45  mins.  Marshell Garfinkel MD  Pulmonary and Critical Care Pager [phone] If no answer or after 3pm call: [phone] 08/25/2016, 8:44 AM

## 2016-08-25 NOTE — Plan of Care (Addendum)
  Interdisciplinary Goals of Care Family Meeting   Date carried out:: 08/25/2016  Location of the meeting: Conference room  Member's involved: Physician, Bedside Registered Nurse and Family Member or next of kin  Durable Power of Attorney or acting medical decision maker: Daughter [name], [name] who just drove from [place]  Discussion: We discussed goals of care for [name] .  I updated them on the clinical situation. The family is clearly struggling to come to terms with her prognosis. The daughter [name] has done some research online and is inquiring about use of NO, nimodipine etc increase blood supply to the brain. I told them that she has adequate blood supply now but has suffered significant anoxic injury from her cardiac arrest on admission. There is no evidence that these measures will be of help now and the best we can do is supportive care and monitor for any recovery. They have a family member who suffered traumatic brain injury was apparently recovered her functional status and are hopeful [name] would do the same. I informed them that this is not the same situation.  They reiterated that her goal would be to return to her baseline level of independent functioning at home which in my opinion is very unlikely. They have inquired about a tracheotomy and hemodialysis if needed given her progressive renal failure. She does not need these currently but the family may ask for these in the future days. They had requested a neurology evaluation of the patient.  Code status: Full Code  Disposition: Continue current acute care  Time spent for the meeting: 30 mins  Recardo Linn 08/25/2016, 6:38 PM ;

## 2016-08-25 NOTE — Progress Notes (Signed)
Pt's daughter contacted this RN to say the family will be available for a goals of care meeting with Dr. Vaughan Browner today 08/25/16 at 1500 in the [facility] consult room.

## 2016-08-25 DEATH — deceased

## 2016-08-26 ENCOUNTER — Inpatient Hospital Stay (HOSPITAL_COMMUNITY): Payer: Commercial Managed Care - HMO

## 2016-08-26 ENCOUNTER — Encounter: Payer: Self-pay | Admitting: General Surgery

## 2016-08-26 DIAGNOSIS — J9601 Acute respiratory failure with hypoxia: Secondary | ICD-10-CM

## 2016-08-26 DIAGNOSIS — G931 Anoxic brain damage, not elsewhere classified: Secondary | ICD-10-CM

## 2016-08-26 LAB — PHOSPHORUS: Phosphorus: 3.6 mg/dL (ref 2.5–4.6)

## 2016-08-26 LAB — GLUCOSE, CAPILLARY
GLUCOSE-CAPILLARY: 195 mg/dL — AB (ref 65–99)
GLUCOSE-CAPILLARY: 140 mg/dL — AB (ref 65–99)
GLUCOSE-CAPILLARY: 152 mg/dL — AB (ref 65–99)
GLUCOSE-CAPILLARY: 166 mg/dL — AB (ref 65–99)
GLUCOSE-CAPILLARY: 204 mg/dL — AB (ref 65–99)
GLUCOSE-CAPILLARY: 191 mg/dL — AB (ref 65–99)
Glucose-Capillary: 103 mg/dL — ABNORMAL HIGH (ref 65–99)
Glucose-Capillary: 123 mg/dL — ABNORMAL HIGH (ref 65–99)
Glucose-Capillary: 132 mg/dL — ABNORMAL HIGH (ref 65–99)
Glucose-Capillary: 168 mg/dL — ABNORMAL HIGH (ref 65–99)
Glucose-Capillary: 206 mg/dL — ABNORMAL HIGH (ref 65–99)
Glucose-Capillary: 210 mg/dL — ABNORMAL HIGH (ref 65–99)
Glucose-Capillary: 217 mg/dL — ABNORMAL HIGH (ref 65–99)
Glucose-Capillary: 226 mg/dL — ABNORMAL HIGH (ref 65–99)
Glucose-Capillary: 233 mg/dL — ABNORMAL HIGH (ref 65–99)

## 2016-08-26 LAB — MAGNESIUM: Magnesium: 1.7 mg/dL (ref 1.7–2.4)

## 2016-08-26 LAB — BASIC METABOLIC PANEL
Anion gap: 9 (ref 5–15)
BUN: 64 mg/dL — AB (ref 6–20)
CALCIUM: 9 mg/dL (ref 8.9–10.3)
CHLORIDE: 110 mmol/L (ref 101–111)
CO2: 22 mmol/L (ref 22–32)
CREATININE: 2.98 mg/dL — AB (ref 0.44–1.00)
GFR calc Af Amer: 17 mL/min — ABNORMAL LOW (ref >60)
GFR calc non Af Amer: 15 mL/min — ABNORMAL LOW (ref >60)
Glucose, Bld: 209 mg/dL — ABNORMAL HIGH (ref 65–99)
Potassium: 3.6 mmol/L (ref 3.5–5.1)
SODIUM: 141 mmol/L (ref 135–145)

## 2016-08-26 LAB — CBC
HEMATOCRIT: 26 % — AB (ref 36.0–46.0)
HEMOGLOBIN: 8.3 g/dL — AB (ref 12.0–15.0)
MCH: 27.7 pg (ref 26.0–34.0)
MCHC: 31.9 g/dL (ref 30.0–36.0)
MCV: 86.7 fL (ref 78.0–100.0)
Platelets: 253 10*3/uL (ref 150–400)
RBC: 3 MIL/uL — ABNORMAL LOW (ref 3.87–5.11)
RDW: 15.7 % — AB (ref 11.5–15.5)
WBC: 18 10*3/uL — ABNORMAL HIGH (ref 4.0–10.5)

## 2016-08-26 LAB — VANCOMYCIN, TROUGH: Vancomycin Tr: 30 ug/mL (ref 15–20)

## 2016-08-26 MED ORDER — DEXTROSE 10 % IV SOLN: INTRAVENOUS | Status: DC | PRN

## 2016-08-26 MED ORDER — INSULIN GLARGINE 100 UNIT/ML ~~LOC~~ SOLN: 10.0000 [IU] | SUBCUTANEOUS | Status: DC

## 2016-08-26 MED ORDER — INSULIN ASPART 100 UNIT/ML ~~LOC~~ SOLN: 1.0000 [IU] | SUBCUTANEOUS | Status: DC

## 2016-08-26 MED ORDER — INSULIN ASPART 100 UNIT/ML ~~LOC~~ SOLN
2.0000 [IU] | SUBCUTANEOUS | Status: DC
  Administered 2016-08-26: 6 [IU] via SUBCUTANEOUS
  Administered 2016-08-26: 4 [IU] via SUBCUTANEOUS
  Administered 2016-08-26: 6 [IU] via SUBCUTANEOUS
  Administered 2016-08-26: 4 [IU] via SUBCUTANEOUS

## 2016-08-26 MED ORDER — INSULIN GLARGINE 100 UNIT/ML ~~LOC~~ SOLN
10.0000 [IU] | SUBCUTANEOUS | Status: DC
  Administered 2016-08-26: 10 [IU] via SUBCUTANEOUS
  Filled 2016-08-26 (×2): qty 0.1

## 2016-08-26 MED ORDER — SODIUM CHLORIDE 0.9 % IV SOLN
INTRAVENOUS | Status: DC
  Administered 2016-08-26: 1.7 [IU]/h via INTRAVENOUS
  Filled 2016-08-26: qty 2.5

## 2016-08-26 MED ORDER — PANTOPRAZOLE SODIUM 40 MG PO PACK
40.0000 mg | PACK | ORAL | Status: DC
  Administered 2016-08-26 – 2016-08-31 (×6): 40 mg
  Filled 2016-08-26 (×6): qty 20

## 2016-08-26 MED ORDER — INSULIN GLARGINE 100 UNIT/ML ~~LOC~~ SOLN
10.0000 [IU] | SUBCUTANEOUS | Status: DC
  Filled 2016-08-26: qty 0.1

## 2016-08-26 NOTE — Consult Note (Signed)
NEURO HOSPITALIST CONSULT NOTE   Requestig physician: Dr. Titus Mould   Reason for Consult:Prognosis   History obtained from:  Chart  HPI:                                                                                                                                          [name] is an 43 female who presented in PEA arrest, downtown was for 67min, called to evaluate patient for prognosis.  Past Medical History:  Diagnosis Date  . Arthritis    Inflammatory  . Cancer ([place])   . CHF (congestive heart failure) ([place])   . Chronic renal insufficiency February 07, 2015  . Coronary artery dilation   . Diabetes mellitus without complication ([place])    Type II  . Diverticulitis   . Hyperlipidemia   . Hypertension   . Renal insufficiency   . Sinus problem   . Thyroid disease   . Ulcer of left lower leg [facility]) February 07, 2015    Past Surgical History:  Procedure Laterality Date  . ABDOMINAL HYSTERECTOMY    . CATARACT EXTRACTION W/PHACO Right 03/11/2015   Procedure: CATARACT EXTRACTION PHACO AND INTRAOCULAR LENS PLACEMENT (IOC);  Surgeon: Birder Robson, MD;  Location: [facility] ORS;  Service: Ophthalmology;  Laterality: Right;  [place] 00:40 AP% 24.3 CDE 9.86  . CENTRAL VENOUS CATHETER INSERTION Right 06/09/2016   Procedure: INSERTION CENTRAL LINE ADULT;  Surgeon: Dia Crawford III, MD;  Location: [facility] ORS;  Service: General;  Laterality: Right;  . COLON RESECTION SIGMOID N/A 06/09/2016   Procedure: COLON RESECTION SIGMOID;  Surgeon: Dia Crawford III, MD;  Location: [facility] ORS;  Service: General;  Laterality: N/A;  . COLOSTOMY N/A 06/09/2016   Procedure: COLOSTOMY;  Surgeon: Dia Crawford III, MD;  Location: [facility] ORS;  Service: General;  Laterality: N/A;  . CYST EXCISION    . DILATION AND CURETTAGE OF UTERUS  1984  . I&D EXTREMITY Right 09/04/2015   Procedure: IRRIGATION AND DEBRIDEMENT EXTREMITY/ AND BIOPSY;  Surgeon: Algernon Huxley, MD;  Location: [facility] ORS;  Service: Vascular;   Laterality: Right;  . ILEOSTOMY CLOSURE N/A 07/15/2016   Procedure: ILEOSTOMY TAKEDOWN;  Surgeon: Dia Crawford III, MD;  Location: [facility] ORS;  Service: General;  Laterality: N/A;  . PARTIAL HYSTERECTOMY  1985  . ROTATOR CUFF REPAIR  2004    Family History  Problem Relation Age of Onset  . Diabetes Mellitus II Mother   . Diabetes Mellitus II Father       Social History:  reports that she has quit smoking. She quit after 45.00 years of use. She has never used smokeless tobacco. She reports that she does not drink alcohol or use drugs.  Allergies  Allergen Reactions  . Doxycycline Other (See Comments)    Reaction:  unknown  . Levaquin [Levofloxacin] Other (See Comments)    Causes joint pain  . Penicillins Rash and Other (See Comments)    Has patient had a PCN reaction causing immediate rash, facial/tongue/throat swelling, SOB or lightheadedness with hypotension: Yes Has patient had a PCN reaction causing severe rash involving mucus membranes or skin necrosis: No Has patient had a PCN reaction that required hospitalization No Has patient had a PCN reaction occurring within the last 10 years: No If all of the above answers are "NO", then may proceed with Cephalosporin use.  . Sulfa Antibiotics Swelling and Rash    MEDICATIONS:                                                                                                                     I have reviewed the patient's current medications.   ROS:                                                                                                                                       History obtained from chart review  General ROS: negative for - chills, fatigue, fever, night sweats, weight gain or weight loss Psychological ROS: negative for - behavioral disorder, hallucinations, memory difficulties, mood swings or suicidal ideation Ophthalmic ROS: negative for - blurry vision, double vision, eye pain or loss of vision ENT ROS: negative for  - epistaxis, nasal discharge, oral lesions, sore throat, tinnitus or vertigo Allergy and Immunology ROS: negative for - hives or itchy/watery eyes Hematological and Lymphatic ROS: negative for - bleeding problems, bruising or swollen lymph nodes Endocrine ROS: negative for - galactorrhea, hair pattern changes, polydipsia/polyuria or temperature intolerance Respiratory ROS: negative for - cough, hemoptysis, shortness of breath or wheezing Cardiovascular ROS: negative for - chest pain, dyspnea on exertion, edema or irregular heartbeat Gastrointestinal ROS: negative for - abdominal pain, diarrhea, hematemesis, nausea/vomiting or stool incontinence Genito-Urinary ROS: negative for - dysuria, hematuria, incontinence or urinary frequency/urgency Musculoskeletal ROS: negative for - joint swelling or muscular weakness Neurological ROS: as noted in HPI Dermatological ROS: negative for rash and skin lesion changes   Blood pressure (!) 144/62, pulse 82, temperature 98.8 F (37.1 C), resp. rate (!) 30, height 5\' 6"  (1.676 m), weight 100.2 kg (220 lb 14.4 oz), SpO2 100 %.   Neurologic Examination:  HEENT-  Normocephalic, no lesions, without obvious abnormality.  Normal external eye and conjunctiva.  Normal TM's bilaterally.  Normal auditory canals and external ears. Normal external nose, mucus membranes and septum.  Normal pharynx. Cardiovascular- regular rate and rhythm, S1, S2 normal, no murmur, click, rub or gallop, pulses palpable throughout   Lungs- chest clear, no wheezing, rales, normal symmetric air entry, Heart exam - S1, S2 normal, no murmur, no gallop, rate regular Abdomen- soft, non-tender; bowel sounds normal; no masses,  no organomegaly   Neurological Examination Mental Status: Comatose Eyes remain closed to verbal and noxious stimuli No corneals No VOR's No gag Weak cough Pupils are  unequal, L 80mm, R 63mm and minimally reactive No motor response to noxious stimuli      Lab Results: Basic Metabolic Panel:  Recent Labs Lab 08/21/2016 0350 08/03/2016 0359 08/24/16 0151 08/25/16 0227 08/26/16 0252  NA 139 140 143 141 141  K 4.7 4.6 4.4 4.2 3.6  CL 109 111 114* 109 110  CO2 14*  --  20* 20* 22  GLUCOSE 369* 357* 146* 307* 209*  BUN 27* 34* 42* 53* 64*  CREATININE 2.29* 2.10* 2.59* 3.20* 2.98*  CALCIUM 8.4*  --  8.8* 9.2 9.0  MG 1.7  --  1.4* 1.7 1.7  PHOS  --   --  4.2 4.8* 3.6    Liver Function Tests:  Recent Labs Lab 08/15/2016 0350  AST 71*  ALT 25  ALKPHOS 98  BILITOT 0.6  PROT 6.9  ALBUMIN 3.1*   No results for input(s): LIPASE, AMYLASE in the last 168 hours. No results for input(s): AMMONIA in the last 168 hours.  CBC:  Recent Labs Lab 07/31/2016 0350 07/31/2016 0359 08/24/16 0151 08/25/16 0227 08/26/16 0252  WBC 19.4*  --  22.4* 24.7* 18.0*  NEUTROABS 13.7*  --   --   --   --   HGB 9.3* 9.9* 8.2* 8.7* 8.3*  HCT 30.4* 29.0* 25.6* 26.8* 26.0*  MCV 90.7  --  88.9 86.5 86.7  PLT 307  --  232 263 253    Cardiac Enzymes:  Recent Labs Lab 07/30/2016 0350  TROPONINI 0.09*    Lipid Panel:  Recent Labs Lab 08/24/16 0509  TRIG 44    CBG:  Recent Labs Lab 08/26/16 0525 08/26/16 0604 08/26/16 0709 08/26/16 0811 08/26/16 0908  GLUCAP 140* 132* 103* 47* 152*    Microbiology: Results for orders placed or performed during the hospital encounter of 08/22/2016  Culture, blood (Routine X 2) w Reflex to ID Panel     Status: None (Preliminary result)   Collection Time: 08/02/2016  3:53 AM  Result Value Ref Range Status   Specimen Description BLOOD LEFT HAND  Final   Special Requests BOTTLES DRAWN AEROBIC AND ANAEROBIC 5CC EA  Final   Culture NO GROWTH 3 DAYS  Final   Report Status PENDING  Incomplete  Culture, blood (Routine X 2) w Reflex to ID Panel     Status: None (Preliminary result)   Collection Time: 08/04/2016  4:10 AM  Result  Value Ref Range Status   Specimen Description BLOOD RIGHT HAND  Final   Special Requests BOTTLES DRAWN AEROBIC AND ANAEROBIC 5CC EA  Final   Culture NO GROWTH 3 DAYS  Final   Report Status PENDING  Incomplete  Urine culture     Status: None   Collection Time: 08/19/2016  4:27 AM  Result Value Ref Range Status   Specimen Description URINE, RANDOM  Final  Special Requests ADDED [id number]  Final   Culture NO GROWTH  Final   Report Status 08/24/2016 FINAL  Final  Culture, blood (Routine X 2) w Reflex to ID Panel     Status: None (Preliminary result)   Collection Time: 07/26/2016  9:10 AM  Result Value Ref Range Status   Specimen Description BLOOD RIGHT ARM  Final   Special Requests BOTTLES DRAWN AEROBIC ONLY 5CC  Final   Culture NO GROWTH 3 DAYS  Final   Report Status PENDING  Incomplete  Culture, blood (Routine X 2) w Reflex to ID Panel     Status: None (Preliminary result)   Collection Time: 08/08/2016  9:18 AM  Result Value Ref Range Status   Specimen Description BLOOD RIGHT HAND  Final   Special Requests IN PEDIATRIC BOTTLE 3CC  Final   Culture NO GROWTH 3 DAYS  Final   Report Status PENDING  Incomplete  MRSA PCR Screening     Status: None   Collection Time: 08/21/2016  1:01 PM  Result Value Ref Range Status   MRSA by PCR NEGATIVE NEGATIVE Final    Comment:        The GeneXpert MRSA Assay (FDA approved for NASAL specimens only), is one component of a comprehensive MRSA colonization surveillance program. It is not intended to diagnose MRSA infection nor to guide or monitor treatment for MRSA infections.     Coagulation Studies: No results for input(s): LABPROT, INR in the last 72 hours.  Imaging: Dg Chest Port 1 View  Result Date: 08/26/2016 CLINICAL DATA:  Respiratory failure. EXAM: PORTABLE CHEST 1 VIEW COMPARISON:  08/25/2016. FINDINGS: Endotracheal tube and NG tube in stable position. Cardiomegaly with bilateral pulmonary infiltrates consistent pulmonary edema again noted. No  interim change. Small left pleural effusion again noted. No pneumothorax. IMPRESSION: 1. Lines and tubes in stable position. 2. Cardiomegaly with bilateral pulmonary edema, no interim change from prior exam. Small left pleural effusion again noted. Electronically Signed   By: Marcello Moores  Register   On: 08/26/2016 07:12   Dg Chest Port 1 View  Result Date: 08/25/2016 CLINICAL DATA:  Acute respiratory failure. EXAM: PORTABLE CHEST 1 VIEW COMPARISON:  Chest x-ray 07/26/2016.  CT scan 08/02/2016. FINDINGS: Endotracheal tube and NG tube in stable position. Cardiomegaly with persistent but significant improved bilateral pulmonary infiltrates consistent with improving pulmonary edema. Small left pleural effusion. No pneumothorax. IMPRESSION: 1. Lines and tubes in stable position. 2. Persistent but significant improvement of bilateral pulmonary edema. Electronically Signed   By: Marcello Moores  Register   On: 08/25/2016 07:00        Assessment/Plan: 59 female who presented in PEA arrest, downtown was for 11min, called to evaluate patient for prognosis.  Currently sedated on Propofol and Fentanyl for vent dyschrony and myoclonic jerking which was noted in the beginning of her admission  - MRI brain w/out contrast - Prolonged EEG  After above testing will have a better assessment of extent of her anoxic brain injury. In the meantime please minimize sedation

## 2016-08-26 NOTE — Progress Notes (Signed)
Pharmacy Antibiotic Note  [name] is a 70 y.o. female admitted on 07/28/2016 after cardiac arrest. She received CPR in the field for about 15 minutes with ROSC. Of note pt had a recent ileostomy take down in September. Her lactic acid is elevated, as expected s/p CPR, and is downtrending.   Scr rising 19 BL > peak 3.2> improving 3 today.   Cultures negative so far.  Meropenem and Eraxis doses remain appropriate for renal function. 11/2 VT 30 - stopped per CCM Goal vancomycin trough 15-20 mcg/mL  Plan: D/c vanc Meropenem 1 g IV q12 follow up LOT Eraxis d/c     Height: 5\' 6"  (167.6 cm) Weight: 220 lb 14.4 oz (100.2 kg) IBW/kg (Calculated) : 59.3  Temp (24hrs), Avg:98.6 F (37 C), Min:95.7 F (35.4 C), Max:100.9 F (38.3 C)   Recent Labs Lab 08/21/2016 0350 08/08/2016 0359 08/09/2016 0400 08/07/2016 0647 08/05/2016 0915 08/24/16 0151 08/25/16 0227 08/26/16 0252 08/26/16 0820  WBC 19.4*  --   --   --   --  22.4* 24.7* 18.0*  --   CREATININE 2.29* 2.10*  --   --   --  2.59* 3.20* 2.98*  --   LATICACIDVEN  --   --  8.55* 4.07* 4.3*  --   --   --   --   VANCOTROUGH  --   --   --   --   --   --   --   --  30*    Estimated Creatinine Clearance: 21 mL/min (by C-G formula based on SCr of 2.98 mg/dL (H)).     Antimicrobials this admission: 10/30 vancomycin >> 11/2 10/30 meroepnem >> 10/30 anidulafungin >> 11/2 10/30 Primaxin x1  Dose adjustments this admission: NA  Microbiology results: 10/30 urine cx: neg 10/30 blood cx: ngtd     Bonnita Nasuti Pharm.D. CPP, BCPS Clinical Pharmacist [phone] 08/26/2016 10:07 AM

## 2016-08-26 NOTE — Plan of Care (Signed)
Problem: Education: Goal: Knowledge of [facility] Education information/materials will improve Outcome: Not Met (add Reason) Pt sedated and intubated. UTA comprehension.

## 2016-08-26 NOTE — Progress Notes (Signed)
PULMONARY / CRITICAL CARE MEDICINE   Name: [name] MRN: [id number] DOB: 10-15-1946    ADMISSION DATE:  08/17/2016  REFERRING MD:  Randal Buba (EDP)   CHIEF COMPLAINT:  PEA arrest   HISTORY OF PRESENT ILLNESS:   70 yo female found unresponsive at home with PEA after calling EMS for sudden onset of dyspnea.  Had about 40 minutes for ROSC.  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  SUBJECTIVE: Remains unresponsive.    VITAL SIGNS: BP (!) 144/62   Pulse 82   Temp 98.8 F (37.1 C)   Resp (!) 30   Ht 5\' 6"  (1.676 m)   Wt 220 lb 14.4 oz (100.2 kg)   SpO2 100%   BMI 35.65 kg/m   VENTILATOR SETTINGS: Vent Mode: PRVC FiO2 (%):  [40 %] 40 % Set Rate:  [30 bmp] 30 bmp Vt Set:  [360 mL] 360 mL PEEP:  [5 cmH20] 5 cmH20 Plateau Pressure:  [16 cmH20-18 cmH20] 17 cmH20  INTAKE / OUTPUT: I/O last 3 completed shifts: In: 2890.1 [I.V.:1170.1; NG/GT:1140; IV Piggyback:580] Out: 2200 [Urine:2200]  PHYSICAL EXAMINATION: General: on diprivan Neuro: no response to painful stimuli HEENT: pupils mid point, Lt ward gaze Cardiovascular: regular, no murmur Lungs: faint b/l crackles Abdomen: rentention sutures in place, mild swelling erythema lower abd  Musculoskeletal: no edema  LABS:  BMET  Recent Labs Lab 08/24/16 0151 08/25/16 0227 08/26/16 0252  NA 143 141 141  K 4.4 4.2 3.6  CL 114* 109 110  CO2 20* 20* 22  BUN 42* 53* 64*  CREATININE 2.59* 3.20* 2.98*  GLUCOSE 146* 307* 209*    Electrolytes  Recent Labs Lab 08/24/16 0151 08/25/16 0227 08/26/16 0252  CALCIUM 8.8* 9.2 9.0  MG 1.4* 1.7 1.7  PHOS 4.2 4.8* 3.6    CBC  Recent Labs Lab 08/24/16 0151 08/25/16 0227 08/26/16 0252  WBC 22.4* 24.7* 18.0*  HGB 8.2* 8.7* 8.3*  HCT 25.6* 26.8* 26.0*  PLT 232 263 253    Coag's No results for input(s): APTT, INR in the last 168 hours.  Sepsis Markers  Recent  Labs Lab 07/31/2016 0400 08/01/2016 0647 08/21/2016 0915 08/24/16 0151 08/25/16 0227  LATICACIDVEN 8.55* 4.07* 4.3*  --   --   PROCALCITON  --   --  0.47 7.05 6.58    ABG  Recent Labs Lab 08/12/2016 0645 08/17/2016 0800 08/25/16 0434  PHART 7.295* 7.372 7.425  PCO2ART 38.5 28.4* 32.0  PO2ART 65.0* 230* 176*    Liver Enzymes  Recent Labs Lab 08/14/2016 0350  AST 71*  ALT 25  ALKPHOS 98  BILITOT 0.6  ALBUMIN 3.1*    Cardiac Enzymes  Recent Labs Lab 08/07/2016 0350  TROPONINI 0.09*    Glucose  Recent Labs Lab 08/26/16 0403 08/26/16 0525 08/26/16 0604 08/26/16 0709 08/26/16 0811 08/26/16 0908  GLUCAP 168* 140* 132* 103* 123* 152*    Imaging Dg Chest Port 1 View  Result Date: 08/26/2016 CLINICAL DATA:  Respiratory failure. EXAM: PORTABLE CHEST 1 VIEW COMPARISON:  08/25/2016. FINDINGS: Endotracheal tube and NG tube in stable position. Cardiomegaly with bilateral pulmonary infiltrates consistent pulmonary edema again noted. No interim change. Small left pleural effusion again noted. No pneumothorax. IMPRESSION: 1. Lines and tubes in stable position. 2. Cardiomegaly with bilateral pulmonary edema, no interim change from prior exam. Small left pleural effusion again noted. Electronically Signed   By: Marcello Moores  Register   On: 08/26/2016 07:12  STUDIES:  CT head 10/30 >> no acute findings CT abd/pelvis 10/30 >> lower lobe consolidation, small abdominal wall abscess with cellulitis EEG 10/30 >> diffuse background suppression V/Q scan 10/30 >> normal  Echo 10/31 >> EF 65 to 70%, grade 1 DD, PAS 39 mmHg  CULTURES: Urine 10/30 >> negative Blood 10/30 >>   ANTIBIOTICS: Vancomycin 10/29 >> 11/02 Imipenem 10/30 >> 10/30 Meropenem 10/30 >> Anidulafungin 10/30 >> 11/02  SIGNIFICANT EVENTS: 10/30 Admit, surgery consulted 10/31 Myoclonus 11/02 Neuro consulted  LINES/TUBES: ETT 10/30>>>  DISCUSSION: 70 yo female with PEA cardiac arrest with 40 minutes for ROSC.   Developed myoclonus.    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 - consulted neurology to assist with neuro prognostication - will order MRI brain w/o contrast - diprivan to control myoclonus  Acute hypoxic respiratory failure from cardiac arrest and aspiration pneumonia. - full vent support - f/u CXR  PEA cardiac arrest. Hx of CAD, HTN, HLD. - continue norvasc  AKI - baseline creatinine 1.9. - monitor renal fx, urine outpt  Aspiration pneumonia. Abdominal wall abscess with cellulitis. - Day 4 of Abx  Anemia of critical illness. - f/u CBC  DM type II. - transition off insulin gtt  Nutrition. - tube feeds  Hx of RA, temporal arteritis, pyoderma gangrenosum. - continue prednisone  DVT prophylaxis - SQ heparin SUP - protonix Goals of care - full code  No family at bedside  CC time 32 minutes  Chesley Mires, MD [place] 08/26/2016, 9:47 AM Pager:  [phone] After 3pm call: [phone]

## 2016-08-27 ENCOUNTER — Encounter (HOSPITAL_COMMUNITY): Payer: Self-pay | Admitting: Radiology

## 2016-08-27 ENCOUNTER — Inpatient Hospital Stay (HOSPITAL_COMMUNITY): Payer: Commercial Managed Care - HMO

## 2016-08-27 LAB — GLUCOSE, CAPILLARY
GLUCOSE-CAPILLARY: 176 mg/dL — AB (ref 65–99)
GLUCOSE-CAPILLARY: 131 mg/dL — AB (ref 65–99)
GLUCOSE-CAPILLARY: 171 mg/dL — AB (ref 65–99)
GLUCOSE-CAPILLARY: 155 mg/dL — AB (ref 65–99)
GLUCOSE-CAPILLARY: 227 mg/dL — AB (ref 65–99)
Glucose-Capillary: 146 mg/dL — ABNORMAL HIGH (ref 65–99)
Glucose-Capillary: 149 mg/dL — ABNORMAL HIGH (ref 65–99)
Glucose-Capillary: 152 mg/dL — ABNORMAL HIGH (ref 65–99)
Glucose-Capillary: 161 mg/dL — ABNORMAL HIGH (ref 65–99)
Glucose-Capillary: 162 mg/dL — ABNORMAL HIGH (ref 65–99)
Glucose-Capillary: 207 mg/dL — ABNORMAL HIGH (ref 65–99)
Glucose-Capillary: 222 mg/dL — ABNORMAL HIGH (ref 65–99)
Glucose-Capillary: 225 mg/dL — ABNORMAL HIGH (ref 65–99)
Glucose-Capillary: 231 mg/dL — ABNORMAL HIGH (ref 65–99)
Glucose-Capillary: 243 mg/dL — ABNORMAL HIGH (ref 65–99)

## 2016-08-27 LAB — CBC
HEMATOCRIT: 25.8 % — AB (ref 36.0–46.0)
Hemoglobin: 8.3 g/dL — ABNORMAL LOW (ref 12.0–15.0)
MCH: 27.9 pg (ref 26.0–34.0)
MCHC: 32.2 g/dL (ref 30.0–36.0)
MCV: 86.9 fL (ref 78.0–100.0)
Platelets: 234 10*3/uL (ref 150–400)
RBC: 2.97 MIL/uL — ABNORMAL LOW (ref 3.87–5.11)
RDW: 15.2 % (ref 11.5–15.5)
WBC: 11.6 10*3/uL — ABNORMAL HIGH (ref 4.0–10.5)

## 2016-08-27 LAB — BASIC METABOLIC PANEL
Anion gap: 9 (ref 5–15)
BUN: 81 mg/dL — ABNORMAL HIGH (ref 6–20)
CHLORIDE: 113 mmol/L — AB (ref 101–111)
CO2: 20 mmol/L — AB (ref 22–32)
CREATININE: 3.08 mg/dL — AB (ref 0.44–1.00)
Calcium: 8.8 mg/dL — ABNORMAL LOW (ref 8.9–10.3)
GFR calc non Af Amer: 14 mL/min — ABNORMAL LOW (ref >60)
GFR, EST AFRICAN AMERICAN: 17 mL/min — AB (ref >60)
GLUCOSE: 152 mg/dL — AB (ref 65–99)
Potassium: 3.6 mmol/L (ref 3.5–5.1)
Sodium: 142 mmol/L (ref 135–145)

## 2016-08-27 LAB — TRIGLYCERIDES: Triglycerides: 125 mg/dL (ref <150)

## 2016-08-27 MED ORDER — HYDRALAZINE HCL 20 MG/ML IJ SOLN
10.0000 mg | Freq: Once | INTRAMUSCULAR | Status: AC
  Administered 2016-08-27: 10 mg via INTRAVENOUS

## 2016-08-27 MED ORDER — INSULIN GLARGINE 100 UNIT/ML ~~LOC~~ SOLN
10.0000 [IU] | Freq: Every day | SUBCUTANEOUS | Status: DC
  Administered 2016-08-28 – 2016-08-31 (×4): 10 [IU] via SUBCUTANEOUS
  Filled 2016-08-27 (×5): qty 0.1

## 2016-08-27 MED ORDER — INSULIN GLARGINE 100 UNIT/ML ~~LOC~~ SOLN
10.0000 [IU] | Freq: Every day | SUBCUTANEOUS | Status: DC
  Administered 2016-08-27: 10 [IU] via SUBCUTANEOUS
  Filled 2016-08-27: qty 0.1

## 2016-08-27 MED ORDER — IBUPROFEN 100 MG/5ML PO SUSP
600.0000 mg | Freq: Once | ORAL | Status: DC
  Filled 2016-08-27: qty 30

## 2016-08-27 MED ORDER — INSULIN ASPART 100 UNIT/ML ~~LOC~~ SOLN
0.0000 [IU] | SUBCUTANEOUS | Status: DC
  Administered 2016-08-27 (×3): 5 [IU] via SUBCUTANEOUS
  Administered 2016-08-27: 3 [IU] via SUBCUTANEOUS
  Administered 2016-08-28: 5 [IU] via SUBCUTANEOUS
  Administered 2016-08-28: 11 [IU] via SUBCUTANEOUS
  Administered 2016-08-28: 3 [IU] via SUBCUTANEOUS
  Administered 2016-08-28: 8 [IU] via SUBCUTANEOUS
  Administered 2016-08-28 (×2): 5 [IU] via SUBCUTANEOUS
  Administered 2016-08-29 (×2): 8 [IU] via SUBCUTANEOUS
  Administered 2016-08-29 (×2): 5 [IU] via SUBCUTANEOUS
  Administered 2016-08-29: 8 [IU] via SUBCUTANEOUS
  Administered 2016-08-30: 5 [IU] via SUBCUTANEOUS
  Administered 2016-08-30: 8 [IU] via SUBCUTANEOUS
  Administered 2016-08-30: 5 [IU] via SUBCUTANEOUS
  Administered 2016-08-30 (×2): 8 [IU] via SUBCUTANEOUS
  Administered 2016-08-30: 5 [IU] via SUBCUTANEOUS
  Administered 2016-08-31 (×3): 8 [IU] via SUBCUTANEOUS
  Administered 2016-08-31: 5 [IU] via SUBCUTANEOUS

## 2016-08-27 NOTE — Progress Notes (Signed)
PULMONARY / CRITICAL CARE MEDICINE   Name: [name] MRN: [id number] DOB: Jan 12, 1946    ADMISSION DATE:  08/06/2016  REFERRING MD:  Randal Buba (EDP)   CHIEF COMPLAINT:  PEA arrest   HISTORY OF PRESENT ILLNESS:   70 yo female found unresponsive at home with PEA after calling EMS for sudden onset of dyspnea.  Had about 40 minutes for ROSC.  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  SUBJECTIVE: Off diprivan.  No myoclonus.  No improvement in mental status.  For MRI brain.    VITAL SIGNS: BP (!) 150/61 (BP Location: Left Arm)   Pulse 92   Temp 97.7 F (36.5 C)   Resp 17   Ht 5\' 6"  (1.676 m)   Wt 221 lb 1.9 oz (100.3 kg)   SpO2 100%   BMI 35.69 kg/m   VENTILATOR SETTINGS: Vent Mode: PRVC FiO2 (%):  [40 %] 40 % Set Rate:  [14 bmp] 14 bmp Vt Set:  [360 mL] 360 mL PEEP:  [5 cmH20] 5 cmH20 Plateau Pressure:  [4 cmH20-14 cmH20] 9 cmH20  INTAKE / OUTPUT: I/O last 3 completed shifts: In: 2803.6 [I.V.:1313.6; NG/GT:1140; IV Piggyback:350] Out: 1505 [Urine:1505]  PHYSICAL EXAMINATION: General: on vent Neuro: no response to painful stimuli HEENT: pupils mid point Cardiovascular: regular, no murmur Lungs: no wheeze Abdomen: soft Musculoskeletal: 1+ edema  LABS:  BMET  Recent Labs Lab 08/25/16 0227 08/26/16 0252 08/27/16 0530  NA 141 141 142  K 4.2 3.6 3.6  CL 109 110 113*  CO2 20* 22 20*  BUN 53* 64* 81*  CREATININE 3.20* 2.98* 3.08*  GLUCOSE 307* 209* 152*    Electrolytes  Recent Labs Lab 08/24/16 0151 08/25/16 0227 08/26/16 0252 08/27/16 0530  CALCIUM 8.8* 9.2 9.0 8.8*  MG 1.4* 1.7 1.7  --   PHOS 4.2 4.8* 3.6  --     CBC  Recent Labs Lab 08/25/16 0227 08/26/16 0252 08/27/16 0530  WBC 24.7* 18.0* 11.6*  HGB 8.7* 8.3* 8.3*  HCT 26.8* 26.0* 25.8*  PLT 263 253 234    Coag's No results for input(s): APTT, INR in the last 168 hours.  Sepsis  Markers  Recent Labs Lab 08/17/2016 0400 08/13/2016 0647 08/16/2016 0915 08/24/16 0151 08/25/16 0227  LATICACIDVEN 8.55* 4.07* 4.3*  --   --   PROCALCITON  --   --  0.47 7.05 6.58    ABG  Recent Labs Lab 08/24/2016 0645 08/06/2016 0800 08/25/16 0434  PHART 7.295* 7.372 7.425  PCO2ART 38.5 28.4* 32.0  PO2ART 65.0* 230* 176*    Liver Enzymes  Recent Labs Lab 08/01/2016 0350  AST 71*  ALT 25  ALKPHOS 98  BILITOT 0.6  ALBUMIN 3.1*    Cardiac Enzymes  Recent Labs Lab 08/15/2016 0350  TROPONINI 0.09*    Glucose  Recent Labs Lab 08/27/16 0250 08/27/16 0349 08/27/16 0450 08/27/16 0553 08/27/16 0645 08/27/16 0752  GLUCAP 207* 176* 161* 131* 149* 146*    Imaging Dg Chest Port 1 View  Result Date: 08/27/2016 CLINICAL DATA:  Shortness of breath. EXAM: PORTABLE CHEST 1 VIEW COMPARISON:  08/26/2016. FINDINGS: Endotracheal tube and NG tube in stable position. Cardiomegaly with persistent bilateral pulmonary infiltrates consistent pulmonary edema. Small left pleural effusion again noted. IMPRESSION: 1. Lines and tubes stable position. 2. Congestive heart failure with bilateral pulmonary edema and small left pleural effusion. Similar findings noted on prior exam. Electronically Signed   By: Marcello Moores  Register   On: 08/27/2016 07:15     STUDIES:  CT head 10/30 >> no acute findings CT abd/pelvis 10/30 >> lower lobe consolidation, small abdominal wall abscess with cellulitis EEG 10/30 >> diffuse background suppression V/Q scan 10/30 >> normal  Echo 10/31 >> EF 65 to 70%, grade 1 DD, PAS 39 mmHg  CULTURES: Urine 10/30 >> negative Blood 10/30 >>   ANTIBIOTICS: Vancomycin 10/29 >> 11/02 Imipenem 10/30 >> 10/30 Meropenem 10/30 >> Anidulafungin 10/30 >> 11/02  SIGNIFICANT EVENTS: 10/30 Admit, surgery consulted 10/31 Myoclonus 11/02 Neuro consulted, off insulin gtt 11/03 off diprivan  LINES/TUBES: ETT 10/30>>>  DISCUSSION: 70 yo female with PEA cardiac arrest with 40  minutes for ROSC.  Developed myoclonus.    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 - f/u MRI brain w/o contrast - neuro to assist with family discussions about prognosis based on MRI findings - wean off fentanyl as able  Acute hypoxic respiratory failure from cardiac arrest and aspiration pneumonia. - full vent support - f/u CXR intermittently  PEA cardiac arrest. Hx of CAD, HTN, HLD. - continue norvasc  AKI - baseline creatinine 1.9. - monitor renal fx, urine outpt  Aspiration pneumonia. Abdominal wall abscess with cellulitis. - Day 5/7 of Abx  Anemia of critical illness. - f/u CBC intermittently  DM type II. - SSI  Nutrition. - tube feeds  Hx of RA, temporal arteritis, pyoderma gangrenosum. - continue prednisone  DVT prophylaxis - SQ heparin SUP - protonix Goals of care - full code  No family at bedside  CC time 31 minutes  Chesley Mires, MD [place] 08/27/2016, 8:55 AM Pager:  [phone] After 3pm call: [phone]

## 2016-08-27 NOTE — Progress Notes (Addendum)
Inpatient Diabetes Program Recommendations  AACE/ADA: New Consensus Statement on Inpatient Glycemic Control (2015)  Target Ranges:  Prepandial:   less than 140 mg/dL      Peak postprandial:   less than 180 mg/dL (1-2 hours)      Critically ill patients:  140 - 180 mg/dL   Results for [name], [name] (MRN [id number]) as of 08/27/2016 09:01  Ref. Range 08/27/2016 00:45 08/27/2016 01:48 08/27/2016 02:50 08/27/2016 03:49 08/27/2016 04:50 08/27/2016 05:53 08/27/2016 06:45 08/27/2016 07:52  Glucose-Capillary Latest Ref Range: 65 - 99 mg/dL 222 (H) 231 (H) 207 (H) 176 (H) 161 (H) 131 (H) 149 (H) 146 (H)  Results for [name], [name] (MRN [id number]) as of 08/27/2016 09:01  Ref. Range 06/16/2016 05:01  Hemoglobin A1C Latest Ref Range: 4.0 - 6.0 % 7.9 (H)   Review of Glycemic Control  Diabetes history: DM2, renal insufficiency Outpatient Diabetes medications: Lantus 25 units QHS, Novolog 12 units TIDAC, Actos 15 mg daily Current orders for Inpatient glycemic control: Glucostabilizer with 5 CBG's WNL, tube feedings at a constant rate of 25 mL per hour  Inpatient Diabetes Program Recommendations: When transitioning off IV insulin, patient will need basal insulin 2 hours prior to transition.   Thank you,  Windy Carina, RN, [name] Diabetes Coordinator Inpatient Diabetes Program [phone] (Team Pager)

## 2016-08-27 NOTE — Progress Notes (Deleted)
eLink Physician-Brief Progress Note Patient Name: [name] DOB: 1946/09/12 MRN: [id number]   Date of Service  08/27/2016  HPI/Events of Note  Pt w fever to 102. Has received tylenol. cx pending from 10/30  eICU Interventions  Ibuprofen x 1     Intervention Category Minor Interventions: Other:  Jacie Tristan S. 08/27/2016, 4:45 AM

## 2016-08-27 NOTE — Progress Notes (Addendum)
Pt Hr 100-110s, temp 37.9 C, pt unable to wean-resp 11, Md/Shikhman updated, communicated to began increasing fentanyl for RASS of -2/-3, nursing will cont to monitor  Family request Palliative Care Consult

## 2016-08-27 NOTE — Progress Notes (Signed)
Pt transported to MRI on vent and placed on MRI with same settings during the scan. Placed back on regular vent and transported back no 2h02. No complications.

## 2016-08-27 NOTE — Consult Note (Signed)
   [facility] CM Inpatient Consult   08/27/2016  HARIKA JEANPHILIPPE August 08, 1946 [id number]  Patient screened for potential [facility] Management services for HF risk and for 3 admissions in 6 months review . Patient is eligible for [facility] Care Management services under patient's [facility] Registry member.  Chart reviewed for progress and needs for services.  Chart review reveals the patient is 70 yo female found unresponsive at home with PEA after calling EMS for sudden onset of dyspnea.  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 Patient is currently on the ventilator.  Follow for progress and needs.  For questions, please contact:   Natividad Brood, RN [name] [facility] Liaison  [phone] business mobile phone Toll free office [phone]

## 2016-08-27 NOTE — Progress Notes (Signed)
Patient HR 120s, RR 27 and ventilator set at RR 14. Blood pressure 175/69. One dose of prn Hydralazine  Given at 0307. Fentanyl increased to 100 mcg due to breathing over the ventilator and increased HR. MD called and notified. Orders given for one dose of 10 mg of Hydralazine given.

## 2016-08-28 ENCOUNTER — Inpatient Hospital Stay (HOSPITAL_COMMUNITY): Payer: Commercial Managed Care - HMO

## 2016-08-28 LAB — CULTURE, BLOOD (ROUTINE X 2)
CULTURE: NO GROWTH
CULTURE: NO GROWTH
CULTURE: NO GROWTH
Culture: NO GROWTH

## 2016-08-28 LAB — BASIC METABOLIC PANEL
ANION GAP: 9 (ref 5–15)
BUN: 95 mg/dL — ABNORMAL HIGH (ref 6–20)
CHLORIDE: 112 mmol/L — AB (ref 101–111)
CO2: 20 mmol/L — AB (ref 22–32)
Calcium: 8.6 mg/dL — ABNORMAL LOW (ref 8.9–10.3)
Creatinine, Ser: 3.08 mg/dL — ABNORMAL HIGH (ref 0.44–1.00)
GFR calc non Af Amer: 14 mL/min — ABNORMAL LOW (ref >60)
GFR, EST AFRICAN AMERICAN: 17 mL/min — AB (ref >60)
Glucose, Bld: 199 mg/dL — ABNORMAL HIGH (ref 65–99)
POTASSIUM: 4.2 mmol/L (ref 3.5–5.1)
Sodium: 141 mmol/L (ref 135–145)

## 2016-08-28 LAB — GLUCOSE, CAPILLARY
GLUCOSE-CAPILLARY: 270 mg/dL — AB (ref 65–99)
GLUCOSE-CAPILLARY: 324 mg/dL — AB (ref 65–99)
GLUCOSE-CAPILLARY: 216 mg/dL — AB (ref 65–99)
GLUCOSE-CAPILLARY: 185 mg/dL — AB (ref 65–99)
Glucose-Capillary: 205 mg/dL — ABNORMAL HIGH (ref 65–99)
Glucose-Capillary: 209 mg/dL — ABNORMAL HIGH (ref 65–99)

## 2016-08-28 MED ORDER — LABETALOL HCL 5 MG/ML IV SOLN
10.0000 mg | Freq: Once | INTRAVENOUS | Status: AC
  Administered 2016-08-28: 10 mg via INTRAVENOUS
  Filled 2016-08-28: qty 4

## 2016-08-28 NOTE — Progress Notes (Signed)
EEG completed, results pending. 

## 2016-08-28 NOTE — Procedures (Signed)
EEG Report  Clinical History:  Cardiac arrest with rhythmic jaw jerks.    Technical Summary:  A 19 channel digital EEG recording was performed using the 10-20 international system of electrode placement.   Bipolar and referential montages were used.  Total recording time was approx 20 minutes.  EEG Description:  Background frequencies are in the low theta range and symmetrical between both hemispheres.  There is significant muscle artifact present.  There is moderately reduced voltages throughout.   No epileptiform discharges or electrographic seizures present. The jaw jerks do not correspond to any EEG changes.    Impression:  This is an abnormal EEG due to the presence of moderate generalized low voltage slowing of brain activity consistent with cortical injury from anoxia.   The patient is not in non-convulsive status epilepticus.    Rogue Jury, MS, MD

## 2016-08-28 NOTE — Progress Notes (Signed)
Family planned to have meeting with Dr Lamonte Sakai at [facility] on 08/29/16

## 2016-08-28 NOTE — Progress Notes (Signed)
Subjective: No major changes.  MRI Brain reviewed personally.  No significant abnormalities.  Some edema of bilateral caudate nucleus and putamen consistent with ABI.  Objective: Vital signs in last 24 hours: Temp:  [96.4 F (35.8 C)-100.2 F (37.9 C)] 97 F (36.1 C) (11/04 0700) Pulse Rate:  [83-138] 85 (11/04 0700) Resp:  [11-24] 14 (11/04 0700) BP: (140-189)/(57-82) 147/57 (11/04 0700) SpO2:  [93 %-100 %] 100 % (11/04 0700) FiO2 (%):  [40 %] 40 % (11/04 0400) Weight:  [102.4 kg (225 lb 12 oz)] 102.4 kg (225 lb 12 oz) (11/04 0311)  Intake/Output from previous day: 11/03 0701 - 11/04 0700 In: 9147 [I.V.:519; NG/GT:630; IV Piggyback:200] Out: 1011 [Urine:1010; Stool:1] Intake/Output this shift: No intake/output data recorded. Nutritional status: Diet NPO time specified  Neurologic Exam: Intubated.  Fentanyl 50 mcg/hour Non-responsive Pupils equal and sluggish OCR intact Corneals intact Gag is intact No withdrawal to pain in any extremity.  There is a rhythmic jaw movements that happen for a minute and stop and are recurrent.   Lab Results:  Recent Labs  08/26/16 0252 08/27/16 0530 08/28/16 0220  WBC 18.0* 11.6*  --   HGB 8.3* 8.3*  --   HCT 26.0* 25.8*  --   PLT 253 234  --   NA 141 142 141  K 3.6 3.6 4.2  CL 110 113* 112*  CO2 22 20* 20*  GLUCOSE 209* 152* 199*  BUN 64* 81* 95*  CREATININE 2.98* 3.08* 3.08*  CALCIUM 9.0 8.8* 8.6*   Lipid Panel  Recent Labs  08/27/16 0530  TRIG 125    Studies/Results: Mr Brain Wo Contrast  Result Date: 08/27/2016 CLINICAL DATA:  Initial evaluation for unresponsiveness. Status post PEA arrest with respiratory failure. EXAM: MRI HEAD WITHOUT CONTRAST TECHNIQUE: Multiplanar, multiecho pulse sequences of the brain and surrounding structures were obtained without intravenous contrast. COMPARISON:  Comparison made with prior CT from 08/10/2016. FINDINGS: Brain: Cerebral volume normal for age. No significant cerebral white  matter disease. There is diffuse ab diffusion abnormality seen throughout the cortical gray matter of both cerebral hemispheres. Similar signal abnormality involves the bilateral caudate and lentiform nuclei. Thalami are relatively spared. Prominent diffusion abnormality within the hippocampi bilaterally. Similar changes seen within the bilateral cerebellar hemispheres. Associated T2/FLAIR hyperintensity within the regions affected. Relative sparing of the supratentorial and infratentorial white matter as well as the brainstem. 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 No significant mass at this time. No evidence for acute vascular infarct. No acute or chronic intracranial hemorrhage. No areas of chronic infarction. No mass lesion, midline shift or mass effect. No hydrocephalus. No extra-axial fluid collection. Major dural sinuses are patent. Incidental note made of a partially empty sella. Vascular: Major intracranial vascular flow voids are maintained. Skull and upper cervical spine: Craniocervical junction normal. Visualized upper cervical spine unremarkable. Bone marrow signal intensity normal. No scalp soft tissue abnormality. Sinuses/Orbits: Globes and orbital soft tissues within normal limits. Patient is status post lens extraction on the right. Scattered mucosal thickening throughout the paranasal sinuses with small fluid level within the left maxillary sinus. Fluid within the nasopharynx. Patient appears to be intubated. Bilateral mastoid effusions noted. 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 Electronically Signed   By: Jeannine Boga M.D.   On: 08/27/2016 14:33   Dg Chest Port 1 View  Result Date: 08/27/2016 CLINICAL DATA:  Shortness of breath. EXAM: PORTABLE CHEST 1 VIEW COMPARISON:  08/26/2016. FINDINGS: Endotracheal tube and NG tube in  stable position. Cardiomegaly with persistent bilateral pulmonary infiltrates consistent pulmonary  edema. Small left pleural effusion again noted. IMPRESSION: 1. Lines and tubes stable position. 2. Congestive heart failure with bilateral pulmonary edema and small left pleural effusion. Similar findings noted on prior exam. Electronically Signed   By: [place]   On: 08/27/2016 07:15    Medications:  Scheduled: . amLODipine  10 mg Oral Daily  . chlorhexidine gluconate (MEDLINE KIT)  15 mL Mouth Rinse BID  . feeding supplement (PRO-STAT SUGAR FREE 64)  30 mL Oral 5 X Daily  . heparin  5,000 Units Subcutaneous Q8H  . insulin aspart  0-15 Units Subcutaneous Q4H  . insulin glargine  10 Units Subcutaneous Daily  . mouth rinse  15 mL Mouth Rinse 10 times per day  . meropenem (MERREM) IV  1 g Intravenous Q12H  . multivitamin  15 mL Oral Daily  . pantoprazole sodium  40 mg Per Tube Q24H  . predniSONE  2.5 mg Oral Daily    Assessment/Plan:  Cardiac arrest with ABI since 10/30.  Brainstem appears relatively intact, but significant cortical injury.  Prognosis is very poor.  I suspect she is also having non-convulsive seizures based on rhythmic jaw jerking.  I will do an EEG to assess that and start AED if necessary.      LOS: 5 days   Rogue Jury, MD 08/28/2016  7:54 AM

## 2016-08-28 NOTE — Progress Notes (Signed)
PULMONARY / CRITICAL CARE MEDICINE   Name: [name] MRN: [id number] DOB: Mar 12, 1946    ADMISSION DATE:  08/17/2016  REFERRING MD:  Randal Buba (EDP)   CHIEF COMPLAINT:  PEA arrest   HISTORY OF PRESENT ILLNESS:   70 yo female found unresponsive at home with PEA after calling EMS for sudden onset of dyspnea.  Had about 40 minutes for ROSC.  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  SUBJECTIVE: Off diprivan.  No myoclonus.  No improvement in mental status.  For MRI brain.    VITAL SIGNS: BP (!) 155/58   Pulse 90   Temp 97 F (36.1 C)   Resp 15   Ht 5\' 6"  (1.676 m)   Wt 102.4 kg (225 lb 12 oz)   SpO2 100%   BMI 36.44 kg/m   VENTILATOR SETTINGS: Vent Mode: CPAP FiO2 (%):  [40 %] 40 % Set Rate:  [14 bmp] 14 bmp Vt Set:  [360 mL] 360 mL PEEP:  [5 cmH20] 5 cmH20 Pressure Support:  [5 cmH20] 5 cmH20 Plateau Pressure:  [11 cmH20-12 cmH20] 12 cmH20  INTAKE / OUTPUT: I/O last 3 completed shifts: In: 2540.8 [I.V.:1010.8; NG/GT:1230; IV Piggyback:300] Out: [id number] [Urine:1530; Stool:1]  PHYSICAL EXAMINATION: General: on vent Neuro: no response to painful stimuli HEENT: pupils mid point Cardiovascular: regular, no murmur Lungs: no wheeze Abdomen: soft Musculoskeletal: 1+ edema  LABS:  BMET  Recent Labs Lab 08/26/16 0252 08/27/16 0530 08/28/16 0220  NA 141 142 141  K 3.6 3.6 4.2  CL 110 113* 112*  CO2 22 20* 20*  BUN 64* 81* 95*  CREATININE 2.98* 3.08* 3.08*  GLUCOSE 209* 152* 199*    Electrolytes  Recent Labs Lab 08/24/16 0151 08/25/16 0227 08/26/16 0252 08/27/16 0530 08/28/16 0220  CALCIUM 8.8* 9.2 9.0 8.8* 8.6*  MG 1.4* 1.7 1.7  --   --   PHOS 4.2 4.8* 3.6  --   --     CBC  Recent Labs Lab 08/25/16 0227 08/26/16 0252 08/27/16 0530  WBC 24.7* 18.0* 11.6*  HGB 8.7* 8.3* 8.3*  HCT 26.8* 26.0* 25.8*  PLT 263 253 234    Coag's No results for  input(s): APTT, INR in the last 168 hours.  Sepsis Markers  Recent Labs Lab 07/31/2016 0400 08/05/2016 0647 08/13/2016 0915 08/24/16 0151 08/25/16 0227  LATICACIDVEN 8.55* 4.07* 4.3*  --   --   PROCALCITON  --   --  0.47 7.05 6.58    ABG  Recent Labs Lab 08/24/2016 0645 08/21/2016 0800 08/25/16 0434  PHART 7.295* 7.372 7.425  PCO2ART 38.5 28.4* 32.0  PO2ART 65.0* 230* 176*    Liver Enzymes  Recent Labs Lab 07/25/2016 0350  AST 71*  ALT 25  ALKPHOS 98  BILITOT 0.6  ALBUMIN 3.1*    Cardiac Enzymes  Recent Labs Lab 08/09/2016 0350  TROPONINI 0.09*    Glucose  Recent Labs Lab 08/27/16 1741 08/27/16 1955 08/27/16 2314 08/28/16 0336 08/28/16 0757 08/28/16 1117  GLUCAP 227* 243* 225* 205* 270* 324*    Imaging Mr Brain Wo Contrast  Result Date: 08/27/2016 CLINICAL DATA:  Initial evaluation for unresponsiveness. Status post PEA arrest with respiratory failure. EXAM: MRI HEAD WITHOUT CONTRAST TECHNIQUE: Multiplanar, multiecho pulse sequences of the brain and surrounding structures were obtained without intravenous contrast. COMPARISON:  Comparison made with prior CT from 08/22/2016. FINDINGS: Brain: Cerebral volume normal for age. No significant cerebral white matter disease. There  is diffuse ab diffusion abnormality seen throughout the cortical gray matter of both cerebral hemispheres. Similar signal abnormality involves the bilateral caudate and lentiform nuclei. Thalami are relatively spared. Prominent diffusion abnormality within the hippocampi bilaterally. Similar changes seen within the bilateral cerebellar hemispheres. Associated T2/FLAIR hyperintensity within the regions affected. Relative sparing of the supratentorial and infratentorial white matter as well as the brainstem. 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 No significant mass at this time. No evidence for acute vascular infarct. No acute or chronic intracranial hemorrhage. No areas of chronic  infarction. No mass lesion, midline shift or mass effect. No hydrocephalus. No extra-axial fluid collection. Major dural sinuses are patent. Incidental note made of a partially empty sella. Vascular: Major intracranial vascular flow voids are maintained. Skull and upper cervical spine: Craniocervical junction normal. Visualized upper cervical spine unremarkable. Bone marrow signal intensity normal. No scalp soft tissue abnormality. Sinuses/Orbits: Globes and orbital soft tissues within normal limits. Patient is status post lens extraction on the right. Scattered mucosal thickening throughout the paranasal sinuses with small fluid level within the left maxillary sinus. Fluid within the nasopharynx. Patient appears to be intubated. Bilateral mastoid effusions noted. 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 Electronically Signed   By: Jeannine Boga M.D.   On: 08/27/2016 14:33     STUDIES:  CT head 10/30 >> no acute findings CT abd/pelvis 10/30 >> lower lobe consolidation, small abdominal wall abscess with cellulitis EEG 10/30 >> diffuse background suppression V/Q scan 10/30 >> normal  Echo 10/31 >> EF 65 to 70%, grade 1 DD, PAS 39 mmHg MRI brain 11/3 >> severe global anoxic brain injury  CULTURES: Urine 10/30 >> negative Blood 10/30 >>   ANTIBIOTICS: Vancomycin 10/29 >> 11/02 Imipenem 10/30 >> 10/30 Meropenem 10/30 >> Anidulafungin 10/30 >> 11/02  SIGNIFICANT EVENTS: 10/30 Admit, surgery consulted 10/31 Myoclonus 11/02 Neuro consulted, off insulin gtt 11/03 off diprivan  LINES/TUBES: ETT 10/30>>>  DISCUSSION: 70 yo female with PEA cardiac arrest with 40 minutes for ROSC.  Developed myoclonus.    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 Supported by MRI and EEG - neuro to assist with family discussions about prognosis based on MRI findings - wean off fentanyl as able  Acute hypoxic  respiratory failure from cardiac arrest and aspiration pneumonia. - full vent support - f/u CXR intermittently  PEA cardiac arrest. Hx of CAD, HTN, HLD. - continue norvasc  AKI - baseline creatinine 1.9. - monitor renal fx, urine outpt  Aspiration pneumonia. Abdominal wall abscess with cellulitis. - Day 6/7 of Abx  Anemia of critical illness. - f/u CBC intermittently  DM type II. - SSI  Nutrition. - tube feeds  Hx of RA, temporal arteritis, pyoderma gangrenosum. - continue prednisone  DVT prophylaxis - SQ heparin SUP - protonix Goals of care - full code  No family at bedside currently. Will attempt to arrange a meeting with them for 11/5  Independent CC time 32 minutes   Baltazar Apo, MD, PhD 08/28/2016, 1:13 PM [facility] [phone] or if no answer [phone]

## 2016-08-28 NOTE — Progress Notes (Signed)
[facility] Progress Note Patient Name: [name] DOB: 01-16-1946 MRN: [id number]   Date of Service  08/28/2016  HPI/Events of Note  Bedside nurse bedside nurse reports patient still hypertensive after IV hydralazine. Currently on Norvasc 10 mg daily.    eICU Interventions  Labetalol 10mg  IV x1     Intervention Category Intermediate Interventions: Hypertension - evaluation and management  Tera Partridge 08/28/2016, 5:36 AM

## 2016-08-29 LAB — GLUCOSE, CAPILLARY
GLUCOSE-CAPILLARY: 263 mg/dL — AB (ref 65–99)
Glucose-Capillary: 239 mg/dL — ABNORMAL HIGH (ref 65–99)
Glucose-Capillary: 268 mg/dL — ABNORMAL HIGH (ref 65–99)
Glucose-Capillary: 257 mg/dL — ABNORMAL HIGH (ref 65–99)
Glucose-Capillary: 236 mg/dL — ABNORMAL HIGH (ref 65–99)

## 2016-08-29 LAB — C DIFFICILE QUICK SCREEN W PCR REFLEX
C DIFFICILE (CDIFF) INTERP: NOT DETECTED
C DIFFICLE (CDIFF) ANTIGEN: NEGATIVE
C Diff toxin: NEGATIVE

## 2016-08-29 MED ORDER — FENTANYL 2500MCG IN NS 250ML (10MCG/ML) PREMIX INFUSION
25.0000 ug/h | INTRAVENOUS | Status: DC
  Filled 2016-08-29: qty 250

## 2016-08-29 NOTE — Progress Notes (Signed)
Subjective: No major changes.  Objective: Vital signs in last 24 hours: Temp:  [96.3 F (35.7 C)-98.6 F (37 C)] 97.7 F (36.5 C) (11/05 0600) Pulse Rate:  [88-112] 102 (11/05 0600) Resp:  [13-24] 23 (11/05 0600) BP: (147-179)/(58-72) 168/69 (11/05 0719) SpO2:  [97 %-100 %] 97 % (11/05 0600) FiO2 (%):  [40 %] 40 % (11/05 0714) Weight:  [102.4 kg (225 lb 12 oz)] 102.4 kg (225 lb 12 oz) (11/05 0300)  Intake/Output from previous day: 11/04 0701 - 11/05 0700 In: 1304.7 [I.V.:249.7; NG/GT:630; IV Piggyback:200] Out: 650 [Urine:650] Intake/Output this shift: No intake/output data recorded. Nutritional status: Diet NPO time specified  Neurologic Exam: Off sedation since yesterday.   Intubated. Unresponsive to verbal or tactile stimuli. Pupils equal but very sluggish to reactive. Corneal reflexes intact. Oculocephalic reflex intact. Gag reflex intact. Withdraws to a small degree to pain in the UE and LE. No rhythmic jaw jerks visible today.  Lab Results:  Recent Labs  08/27/16 0530 08/28/16 0220  WBC 11.6*  --   HGB 8.3*  --   HCT 25.8*  --   PLT 234  --   NA 142 141  K 3.6 4.2  CL 113* 112*  CO2 20* 20*  GLUCOSE 152* 199*  BUN 81* 95*  CREATININE 3.08* 3.08*  CALCIUM 8.8* 8.6*   Lipid Panel  Recent Labs  08/27/16 0530  TRIG 125    Studies/Results: Mr Brain Wo Contrast  Result Date: 08/27/2016 CLINICAL DATA:  Initial evaluation for unresponsiveness. Status post PEA arrest with respiratory failure. EXAM: MRI HEAD WITHOUT CONTRAST TECHNIQUE: Multiplanar, multiecho pulse sequences of the brain and surrounding structures were obtained without intravenous contrast. COMPARISON:  Comparison made with prior CT from 08/14/2016. FINDINGS: Brain: Cerebral volume normal for age. No significant cerebral white matter disease. There is diffuse ab diffusion abnormality seen throughout the cortical gray matter of both cerebral hemispheres. Similar signal abnormality involves  the bilateral caudate and lentiform nuclei. Thalami are relatively spared. Prominent diffusion abnormality within the hippocampi bilaterally. Similar changes seen within the bilateral cerebellar hemispheres. Associated T2/FLAIR hyperintensity within the regions affected. Relative sparing of the supratentorial and infratentorial white matter as well as the brainstem. 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 No significant mass at this time. No evidence for acute vascular infarct. No acute or chronic intracranial hemorrhage. No areas of chronic infarction. No mass lesion, midline shift or mass effect. No hydrocephalus. No extra-axial fluid collection. Major dural sinuses are patent. Incidental note made of a partially empty sella. Vascular: Major intracranial vascular flow voids are maintained. Skull and upper cervical spine: Craniocervical junction normal. Visualized upper cervical spine unremarkable. Bone marrow signal intensity normal. No scalp soft tissue abnormality. Sinuses/Orbits: Globes and orbital soft tissues within normal limits. Patient is status post lens extraction on the right. Scattered mucosal thickening throughout the paranasal sinuses with small fluid level within the left maxillary sinus. Fluid within the nasopharynx. Patient appears to be intubated. Bilateral mastoid effusions noted. 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 Electronically Signed   By: Jeannine Boga M.D.   On: 08/27/2016 14:33    Medications:  Scheduled: . amLODipine  10 mg Oral Daily  . chlorhexidine gluconate (MEDLINE KIT)  15 mL Mouth Rinse BID  . feeding supplement (PRO-STAT SUGAR FREE 64)  30 mL Oral 5 X Daily  . heparin  5,000 Units Subcutaneous Q8H  . insulin aspart  0-15 Units Subcutaneous Q4H  . insulin glargine  10  Units Subcutaneous Daily  . mouth rinse  15 mL Mouth Rinse 10 times per day  . meropenem (MERREM) IV  1 g Intravenous Q12H  .  multivitamin  15 mL Oral Daily  . pantoprazole sodium  40 mg Per Tube Q24H  . predniSONE  2.5 mg Oral Daily    Assessment/Plan:  Cardiac arrest with anoxic brain injury.  The brainstem is intact.  There is some improvement in cortical function when it comes to perceiving pain, however, it does not necessarily indicate full recovery.  No evidence of seizures clinically today or on EEG yesterday.  Continue observation.    LOS: 6 days   Rogue Jury, MD 08/29/2016  7:52 AM

## 2016-08-29 NOTE — Progress Notes (Signed)
CDS notified of the change in code status and the families decision to potentially withdrawal care. Awaiting call back from CDS coordinator.

## 2016-08-29 NOTE — Progress Notes (Signed)
PULMONARY / CRITICAL CARE MEDICINE   Name: [name] MRN: [id number] DOB: 1946-04-19    ADMISSION DATE:  07/29/2016  REFERRING MD:  Randal Buba (EDP)   CHIEF COMPLAINT:  PEA arrest   HISTORY OF PRESENT ILLNESS:   70 yo female found unresponsive at home with PEA after calling EMS for sudden onset of dyspnea.  Had about 40 minutes for ROSC.  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  SUBJECTIVE: No change in neuro status  VITAL SIGNS: BP (!) 162/65   Pulse (!) 111   Temp 97.5 F (36.4 C)   Resp (!) 22   Ht 5\' 6"  (1.676 m)   Wt 102.4 kg (225 lb 12 oz)   SpO2 100%   BMI 36.44 kg/m   VENTILATOR SETTINGS: Vent Mode: PRVC FiO2 (%):  [40 %] 40 % Set Rate:  [14 bmp] 14 bmp Vt Set:  [360 mL] 360 mL PEEP:  [5 cmH20] 5 cmH20 Pressure Support:  [5 cmH20] 5 cmH20 Plateau Pressure:  [12 cmH20-13 cmH20] 13 cmH20  INTAKE / OUTPUT: I/O last 3 completed shifts: In: 2017.7 [I.V.:537.7; [id number]; NG/GT:955; IV Piggyback:300] Out: 1120 [Urine:1120]  PHYSICAL EXAMINATION: General: obese woman, unresponsive off sedating meds.  Neuro: no response to painful stimuli, positive spont breaths,  HEENT: pupils sluggish, but react; periorbital and tongue edema Cardiovascular: regular, no murmur Lungs: no wheeze Abdomen: soft Musculoskeletal: 1+ edema  LABS:  BMET  Recent Labs Lab 08/26/16 0252 08/27/16 0530 08/28/16 0220  NA 141 142 141  K 3.6 3.6 4.2  CL 110 113* 112*  CO2 22 20* 20*  BUN 64* 81* 95*  CREATININE 2.98* 3.08* 3.08*  GLUCOSE 209* 152* 199*    Electrolytes  Recent Labs Lab 08/24/16 0151 08/25/16 0227 08/26/16 0252 08/27/16 0530 08/28/16 0220  CALCIUM 8.8* 9.2 9.0 8.8* 8.6*  MG 1.4* 1.7 1.7  --   --   PHOS 4.2 4.8* 3.6  --   --     CBC  Recent Labs Lab 08/25/16 0227 08/26/16 0252 08/27/16 0530  WBC 24.7* 18.0* 11.6*  HGB 8.7* 8.3* 8.3*  HCT 26.8*  26.0* 25.8*  PLT 263 253 234    Coag's No results for input(s): APTT, INR in the last 168 hours.  Sepsis Markers  Recent Labs Lab 08/13/2016 0400 07/28/2016 0647 07/27/2016 0915 08/24/16 0151 08/25/16 0227  LATICACIDVEN 8.55* 4.07* 4.3*  --   --   PROCALCITON  --   --  0.47 7.05 6.58    ABG  Recent Labs Lab 07/31/2016 0645 08/16/2016 0800 08/25/16 0434  PHART 7.295* 7.372 7.425  PCO2ART 38.5 28.4* 32.0  PO2ART 65.0* 230* 176*    Liver Enzymes  Recent Labs Lab 08/14/2016 0350  AST 71*  ALT 25  ALKPHOS 98  BILITOT 0.6  ALBUMIN 3.1*    Cardiac Enzymes  Recent Labs Lab 08/04/2016 0350  TROPONINI 0.09*    Glucose  Recent Labs Lab 08/28/16 1117 08/28/16 1623 08/28/16 2010 08/28/16 2336 08/29/16 0335 08/29/16 0724  GLUCAP 324* 216* 185* 209* 239* 263*    Imaging No results found.   STUDIES:  CT head 10/30 >> no acute findings CT abd/pelvis 10/30 >> lower lobe consolidation, small abdominal wall abscess with cellulitis EEG 10/30 >> diffuse background suppression V/Q scan 10/30 >> normal  Echo 10/31 >> EF 65 to 70%, grade 1 DD, PAS 39 mmHg MRI brain 11/3 >> severe global anoxic brain injury  CULTURES: Urine 10/30 >> negative Blood 10/30 >>  C diff 10/5 >> negative  ANTIBIOTICS: Vancomycin 10/29 >> 11/02 Imipenem 10/30 >> 10/30 Meropenem 10/30 >> Anidulafungin 10/30 >> 11/02  SIGNIFICANT EVENTS: 10/30 Admit, surgery consulted 10/31 Myoclonus 11/02 Neuro consulted, off insulin gtt 11/03 off diprivan  LINES/TUBES: ETT 10/30>>>  DISCUSSION: 70 yo female with PEA cardiac arrest with 40 minutes for ROSC.  Developed myoclonus.    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 Supported by MRI and EEG - discussed poor prognosis with family at bedside. They are contemplating withdrawal of care.  - would like to add back low dose fentanyl gtt at this point - we have had the opportunity to eval her MS off  sedation  Acute hypoxic respiratory failure from cardiac arrest and aspiration pneumonia. - full vent support until family ready to withdraw - f/u CXR intermittently  PEA cardiac arrest. Hx of CAD, HTN, HLD. - continue norvasc  AKI - baseline creatinine 1.9. - monitor renal fx, urine outpt  Aspiration pneumonia. Abdominal wall abscess with cellulitis. - Day 7/7 of Abx; stop on 11/6  Anemia of critical illness. - f/u CBC intermittently  DM type II. - SSI  Nutrition. - tube feeds  Hx of RA, temporal arteritis, pyoderma gangrenosum. - continue prednisone  DVT prophylaxis - SQ heparin SUP - protonix Goals of care - full code  Family: extended conversation with the patient's 2 daughters and son at bedside. I have explained poor neurological prognosis and they understand. I have recommended a transition to palliation. I believe that they agree w this philosophy but that they are hesitant to w/d MV because they do not want to be directly responsible for her demise. Tried to explain that they are not the cause of this event, and that we would be withdrawing care together, sharing that responsibility. There are two brothers that were not present but who will likely have questions. In meantime I have recommeded DNR status should she arrest. They understand and agree.   Independent CC time 60 minutes   Baltazar Apo, MD, PhD 08/29/2016, 11:00 AM [facility] [phone] or if no answer [phone]

## 2016-08-30 DIAGNOSIS — Z7189 Other specified counseling: Secondary | ICD-10-CM

## 2016-08-30 LAB — GLUCOSE, CAPILLARY
GLUCOSE-CAPILLARY: 238 mg/dL — AB (ref 65–99)
GLUCOSE-CAPILLARY: 239 mg/dL — AB (ref 65–99)
GLUCOSE-CAPILLARY: 256 mg/dL — AB (ref 65–99)
GLUCOSE-CAPILLARY: 282 mg/dL — AB (ref 65–99)
Glucose-Capillary: 256 mg/dL — ABNORMAL HIGH (ref 65–99)
Glucose-Capillary: 257 mg/dL — ABNORMAL HIGH (ref 65–99)
Glucose-Capillary: 290 mg/dL — ABNORMAL HIGH (ref 65–99)

## 2016-08-30 MED ORDER — FENTANYL 2500MCG IN NS 250ML (10MCG/ML) PREMIX INFUSION
25.0000 ug/h | INTRAVENOUS | Status: DC
  Administered 2016-08-30: 75 ug/h via INTRAVENOUS
  Administered 2016-08-31: 100 ug/h via INTRAVENOUS
  Filled 2016-08-30: qty 250

## 2016-08-30 MED ORDER — METOPROLOL TARTRATE 25 MG/10 ML ORAL SUSPENSION
12.5000 mg | Freq: Two times a day (BID) | ORAL | Status: DC
  Administered 2016-08-30 – 2016-08-31 (×3): 12.5 mg
  Filled 2016-08-30 (×3): qty 10

## 2016-08-30 NOTE — Progress Notes (Signed)
PULMONARY / CRITICAL CARE MEDICINE   Name: [name] MRN: [id number] DOB: November 16, 1945    ADMISSION DATE:  08/04/2016  REFERRING MD:  Randal Buba (EDP)   CHIEF COMPLAINT:  PEA arrest   HISTORY OF PRESENT ILLNESS:   70 yo female found unresponsive at home with PEA after calling EMS for sudden onset of dyspnea.  Had about 40 minutes for ROSC.  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  SUBJECTIVE: No change in neuro status  VITAL SIGNS: BP (!) 166/71 (BP Location: Left Arm)   Pulse 79   Temp (!) 95.2 F (35.1 C) Comment: warming blanket applied  Resp 20   Ht 5\' 6"  (1.676 m)   Wt 102.8 kg (226 lb 10.1 oz)   SpO2 100%   BMI 36.58 kg/m   VENTILATOR SETTINGS: Vent Mode: CPAP;PSV FiO2 (%):  [40 %] 40 % Set Rate:  [14 bmp] 14 bmp Vt Set:  [360 mL] 360 mL PEEP:  [5 cmH20] 5 cmH20 Pressure Support:  [10 cmH20] 10 cmH20 Plateau Pressure:  [13 cmH20-19 cmH20] 14 cmH20  INTAKE / OUTPUT: I/O last 3 completed shifts: In: 1600 [I.V.:400; [id number]; NG/GT:775; IV [id number] Out: 1250 [Urine:1100; Stool:150]  PHYSICAL EXAMINATION: General: Obese woman, unresponsive off sedating meds.  Neuro: No response to painful stimuli, positive spont breaths,  HEENT: Pupils sluggish, no doll's eye and no gag reflex this AM Cardiovascular: regular, no murmur Lungs: Distant but CTA bilaterally Abdomen: soft, NT, ND and +BS Musculoskeletal: 1+ edema  LABS:  BMET  Recent Labs Lab 08/26/16 0252 08/27/16 0530 08/28/16 0220  NA 141 142 141  K 3.6 3.6 4.2  CL 110 113* 112*  CO2 22 20* 20*  BUN 64* 81* 95*  CREATININE 2.98* 3.08* 3.08*  GLUCOSE 209* 152* 199*   Electrolytes  Recent Labs Lab 08/24/16 0151 08/25/16 0227 08/26/16 0252 08/27/16 0530 08/28/16 0220  CALCIUM 8.8* 9.2 9.0 8.8* 8.6*  MG 1.4* 1.7 1.7  --   --   PHOS 4.2 4.8* 3.6  --   --    CBC  Recent Labs Lab  08/25/16 0227 08/26/16 0252 08/27/16 0530  WBC 24.7* 18.0* 11.6*  HGB 8.7* 8.3* 8.3*  HCT 26.8* 26.0* 25.8*  PLT 263 253 234   Coag's No results for input(s): APTT, INR in the last 168 hours.  Sepsis Markers  Recent Labs Lab 08/24/16 0151 08/25/16 0227  PROCALCITON 7.05 6.58   ABG  Recent Labs Lab 08/25/16 0434  PHART 7.425  PCO2ART 32.0  PO2ART 176*    Liver Enzymes No results for input(s): AST, ALT, ALKPHOS, BILITOT, ALBUMIN in the last 168 hours.  Cardiac Enzymes No results for input(s): TROPONINI, PROBNP in the last 168 hours.  Glucose  Recent Labs Lab 08/29/16 1145 08/29/16 1622 08/29/16 1943 08/30/16 0012 08/30/16 0431 08/30/16 0721  GLUCAP 268* 257* 236* 257* 256* 238*    Imaging No results found.   STUDIES:  CT head 10/30 >> no acute findings CT abd/pelvis 10/30 >> lower lobe consolidation, small abdominal wall abscess with cellulitis EEG 10/30 >> diffuse background suppression V/Q scan 10/30 >> normal  Echo 10/31 >> EF 65 to 70%, grade 1 DD, PAS 39 mmHg MRI brain 11/3 >> severe global anoxic brain injury  CULTURES: Urine 10/30 >> negative Blood 10/30 >>  C diff 10/5 >> negative  ANTIBIOTICS: Vancomycin 10/29 >> 11/02 Imipenem 10/30 >> 10/30 Meropenem 10/30 >> Anidulafungin 10/30 >> 11/02  SIGNIFICANT EVENTS: 10/30 Admit, surgery consulted 10/31 Myoclonus 11/02 Neuro consulted, off insulin gtt 11/03 off diprivan  LINES/TUBES: ETT 10/30>>>  DISCUSSION: 70 yo female with PEA cardiac arrest with 40 minutes for ROSC.  Developed myoclonus.    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 Supported by MRI and EEG - Discussed poor prognosis with family at bedside. They are contemplating withdrawal of care but DNR for now. - Fentanyl for comfort while family decides on withdrawal of care.  Acute hypoxic respiratory failure from cardiac arrest and aspiration pneumonia. - Full vent support  until family ready to withdraw - F/u CXR intermittently  PEA cardiac arrest. Hx of CAD, HTN, HLD. - Continue norvasc  AKI - baseline creatinine 1.9. - Monitor renal fx, urine outpt  Aspiration pneumonia. Abdominal wall abscess with cellulitis. - Day 7/7 of abx, course complete.  Anemia of critical illness. - F/u CBC intermittently  DM type II. - SSI  Nutrition. - Tube feeds  Hx of RA, temporal arteritis, pyoderma gangrenosum. - Continue prednisone  DVT prophylaxis - SQ heparin SUP - protonix Goals of care - DNR  Will await family's arrival to discuss plan of care.  Will recommend comfort care.  The patient is critically ill with multiple organ systems failure and requires high complexity decision making for assessment and support, frequent evaluation and titration of therapies, application of advanced monitoring technologies and extensive interpretation of multiple databases.   Critical Care Time devoted to patient care services described in this note is  35  Minutes. This time reflects time of care of this signee Dr Jennet Maduro. This critical care time does not reflect procedure time, or teaching time or supervisory time of PA/NP/Med student/Med Resident etc but could involve care discussion time.  Rush Farmer, M.D. [facility] Pulmonary/Critical Care Medicine. Pager: [phone]. After hours pager: [phone].

## 2016-08-30 NOTE — Progress Notes (Signed)
Subjective: No significant changes  Exam: Vitals:   08/30/16 0700 08/30/16 0758  BP: (!) 163/74 (!) 163/74  Pulse: 85 77  Resp: 16 16  Temp: (!) 95.2 F (35.1 C)    Gen: In bed, NAD Resp: non-labored breathing, no acute distress Abd: soft, nt  Neuro: MS: does not open eyes or follow commands.  [id number], Dolls eye intact, corneals intact.  Motor: appears to have some withdrawal vs flexion to noxious stimuli in bilateral upper extremites, flexion in lower extremities.  Sensory:as above.    Impression: 70 yo F with severe anoxic brain injury. She has findings on MRI compatible with this.  Most likely she will be severely debilitated if she recovers, but I am not able to say that with certainty at this time. With some withdrawal and intact brainstem, I think that it is difficult to give an accurate prognosis, but with diffuse cortical restricted diffusion, I think that recovery is made significantly less likely.   If any recovery were to occur, it would likely be over the timeframe of weeks to months, not days.   Recommendations: 1) continue care only to the aggressiveness that the family feels the patient would desire in this setting.  2) No further neurodiagnostic testing is likely to be beneficial at this time.   Roland Rack, MD [facility] [phone]  If 7pm- 7am, please page neurology on call as listed in [place].

## 2016-08-30 NOTE — Progress Notes (Signed)
Nutrition Follow-up  DOCUMENTATION CODES:   Obesity unspecified  INTERVENTION:   Increase insulin for better blood glucose control  Continue: Vital High Protein @ 25 ml/hr (600 ml/day) 30 ml Prostat five times per day  Provides: 1100 kcal, 127 rams protein, and 504 ml H2O.   NUTRITION DIAGNOSIS:   Inadequate oral intake related to inability to eat as evidenced by NPO status. Ongoing.   GOAL:   Provide needs based on ASPEN/SCCM guidelines Met.   MONITOR:   Vent status, Labs, Weight trends, TF tolerance  ASSESSMENT:   70yo female with hx CHF, DM, HTN, breast cancer with recent ileostomy takedown 07/15/16 presented 10/30 after PEA arrest at home. In ER she remained bradycardic and unresponsive but hemodynamically stable, did not require pressors.  Per MD notes recommend comfort care, family considering Patient is currently intubated on ventilator support  Medications reviewed and include: lantus, MVI, prednisone Labs reviewed CBG's: 238-282-290 Weight increased by 10 lb, pt is 3 L positive  Diet Order:  Diet NPO time specified  Skin:  Reviewed, no issues (incisions)  Last BM:  11/4 rectal tube inserted  Height:   Ht Readings from Last 1 Encounters:  07/28/2016 5' 6"  (1.676 m)    Weight:   Wt Readings from Last 1 Encounters:  08/30/16 226 lb 10.1 oz (102.8 kg)    Ideal Body Weight:  59.1 kg  BMI:  Body mass index is 36.58 kg/m.  Estimated Nutritional Needs:   Kcal:  1100-1300 (11-14 kcal/kg)  Protein:  >/= 118 grams (2.0 g/kg IBW)  Fluid:  1.5-1.7 L/day  EDUCATION NEEDS:   No education needs identified at this time  [place], [place], [place] Pager [phone] After Hours Pager

## 2016-08-31 ENCOUNTER — Inpatient Hospital Stay (HOSPITAL_COMMUNITY): Payer: Commercial Managed Care - HMO

## 2016-08-31 DIAGNOSIS — Z515 Encounter for palliative care: Secondary | ICD-10-CM

## 2016-08-31 LAB — BLOOD GAS, ARTERIAL
ACID-BASE DEFICIT: 0.9 mmol/L (ref 0.0–2.0)
BICARBONATE: 23.8 mmol/L (ref 20.0–28.0)
Drawn by: 345601
FIO2: 40
LHR: 14 {breaths}/min
O2 Saturation: 99.1 %
PEEP/CPAP: 5 cmH2O
Patient temperature: 98.6
VT: 360 mL
pCO2 arterial: 43 mmHg (ref 32.0–48.0)
pH, Arterial: 7.361 (ref 7.350–7.450)
pO2, Arterial: 145 mmHg — ABNORMAL HIGH (ref 83.0–108.0)

## 2016-08-31 LAB — CBC
HCT: 23.7 % — ABNORMAL LOW (ref 36.0–46.0)
HEMOGLOBIN: 7.5 g/dL — AB (ref 12.0–15.0)
MCH: 27.9 pg (ref 26.0–34.0)
MCHC: 31.6 g/dL (ref 30.0–36.0)
MCV: 88.1 fL (ref 78.0–100.0)
Platelets: 282 10*3/uL (ref 150–400)
RBC: 2.69 MIL/uL — AB (ref 3.87–5.11)
RDW: 15.4 % (ref 11.5–15.5)
WBC: 13.8 10*3/uL — AB (ref 4.0–10.5)

## 2016-08-31 LAB — GLUCOSE, CAPILLARY
GLUCOSE-CAPILLARY: 250 mg/dL — AB (ref 65–99)
GLUCOSE-CAPILLARY: 269 mg/dL — AB (ref 65–99)
Glucose-Capillary: 259 mg/dL — ABNORMAL HIGH (ref 65–99)

## 2016-08-31 LAB — MAGNESIUM: MAGNESIUM: 2.7 mg/dL — AB (ref 1.7–2.4)

## 2016-08-31 LAB — BASIC METABOLIC PANEL
ANION GAP: 10 (ref 5–15)
BUN: 141 mg/dL — AB (ref 6–20)
CHLORIDE: 114 mmol/L — AB (ref 101–111)
CO2: 22 mmol/L (ref 22–32)
Calcium: 9.5 mg/dL (ref 8.9–10.3)
Creatinine, Ser: 3.31 mg/dL — ABNORMAL HIGH (ref 0.44–1.00)
GFR calc Af Amer: 15 mL/min — ABNORMAL LOW (ref >60)
GFR, EST NON AFRICAN AMERICAN: 13 mL/min — AB (ref >60)
Glucose, Bld: 266 mg/dL — ABNORMAL HIGH (ref 65–99)
POTASSIUM: 4.8 mmol/L (ref 3.5–5.1)
SODIUM: 146 mmol/L — AB (ref 135–145)

## 2016-08-31 LAB — PHOSPHORUS: PHOSPHORUS: 6.3 mg/dL — AB (ref 2.5–4.6)

## 2016-08-31 MED ORDER — MORPHINE BOLUS VIA INFUSION
5.0000 mg | INTRAVENOUS | Status: DC | PRN
  Filled 2016-08-31: qty 20

## 2016-08-31 MED ORDER — MORPHINE SULFATE 25 MG/ML IV SOLN
10.0000 mg/h | INTRAVENOUS | Status: DC
  Administered 2016-08-31: 10 mg/h via INTRAVENOUS
  Filled 2016-08-31: qty 10

## 2016-09-02 ENCOUNTER — Telehealth: Payer: Self-pay

## 2016-09-02 ENCOUNTER — Encounter: Payer: Self-pay | Admitting: Surgery

## 2016-09-02 NOTE — Telephone Encounter (Signed)
On 11/09/2017I received a death certificate from [facility] (original). The death certificate is for burial. The patient is a patient of Doctor Nelda Marseille. The death certificate will be taken to [facility]) this am for signature. On September 04, 2016 I received the death certificate back from Doctor Nelda Marseille. I got the death certificate ready and called the funeral home to let them know the death certificate is ready for pickup.

## 2016-09-24 NOTE — Progress Notes (Signed)
11/7 at 21:12 pt pronounced dead by this RN and Epimenio Sarin, RN after auscultating for heart and lung sounds 1 minute and none identified. Family at bedside, chaplain and hand prints offered. Pt placement notified.  Wasted remaining morphine and fentanyl in sink with Arnetha Courser, RN.

## 2016-09-24 NOTE — Discharge Summary (Signed)
[name], [name]                 ACCOUNT NO.:  [id number]  MEDICAL RECORD NO.:  [id number]  LOCATION:  2H02C                        FACILITY:  [facility]  PHYSICIAN:  Providence Lanius, MD  DATE OF BIRTH:  Apr 25, 1946  DATE OF ADMISSION:  08/13/2016 DATE OF DISCHARGE:  09-15-16                              DISCHARGE SUMMARY   PRIMARY DIAGNOSIS/CAUSE OF DEATH:  Pulseless activity cardiac arrest.  SECONDARY DIAGNOSES: 1. Congestive heart failure. 2. Diabetes. 3. Hypertension. 4. Breast cancer. 5. Acute respiratory failure. 6. Acute anoxic encephalopathy. 7. Acute kidney injury. 8. Aspiration pneumonia. 9. Cellulitis. 10.Anemia of critical illness. 11.Rheumatoid arthritis.  HOSPITAL COURSE:  The patient is a 70 year old female with extensive past medical history, who presented to the hospital after pulseless electric activity cardiac arrest.  The patient was admitted to the intensive care unit and was supported for 8 days.  The patient continued to display evidence of severe anoxic brain injury, especially with 40 minutes of resuscitation until return of spontaneous circulation.  On September 15, 2016, I had discussion with the family, informed that the prognosis remains very poor, Neurology was in agreement, at which point the family agreed with withdrawal of life support measure and progressing with comfort care.  Morphine was started and the patient was extubated and expired shortly after with the family at bedside.     Providence Lanius, MD     WJY/MEDQ  D:  09/07/2016  T:  09/08/2016  Job:  [id number]

## 2016-09-24 NOTE — Progress Notes (Signed)
Inpatient Diabetes Program Recommendations  AACE/ADA: New Consensus Statement on Inpatient Glycemic Control (2015)  Target Ranges:  Prepandial:   less than 140 mg/dL      Peak postprandial:   less than 180 mg/dL (1-2 hours)      Critically ill patients:  140 - 180 mg/dL   Lab Results  Component Value Date   GLUCAP 250 (H) 2016-09-12   HGBA1C 7.9 (H) 06/16/2016    Review of Glycemic Control Results for [name], [name] (MRN [id number]) as of 2016/09/12 08:21  Ref. Range 08/30/2016 07:21 08/30/2016 11:17 08/30/2016 15:57 08/30/2016 20:09 08/30/2016 23:54 09-12-2016 04:04 09/12/16 07:48  Glucose-Capillary Latest Ref Range: 65 - 99 mg/dL 238 (H) 282 (H) 290 (H) 239 (H) 256 (H) 259 (H) 250 (H)   Inpatient Diabetes Program Recommendations:  Please consider increase in Lantus insulin to 15 units daily.  Thank you, Nani Gasser. Sally Reimers, RN, MSN, CDE Inpatient Glycemic Control Team Team Pager [phone] (8am-5pm) Sep 12, 2016 8:22 AM

## 2016-09-24 NOTE — Progress Notes (Signed)
   16-Sep-2016 1130  Clinical Encounter Type  Visited With Patient and family together  Visit Type Initial  Referral From Chaplain  Consult/Referral To Chaplain  Spiritual Encounters  Spiritual Needs Emotional  Stress Factors  Patient Stress Factors Other (Comment) (end of life)  Pt. Is asleep, spoke with grandson, waiting for other family members to appear, nurse inform end-of-life, withdrawal later today, grandson hasn't been advised yet, offered support for pastoral care if needed and further to inform nurse or to call pastoral care.  [employer] [phone]

## 2016-09-24 NOTE — Procedures (Signed)
Extubation Procedure Note  Patient Details:   Name: [name] DOB: May 02, 1946 MRN: [id number]  Patient terminally extubated to [place]. RN and family at bedside  Evaluation  O2 sats: currently acceptable Complications: No apparent complications Patient did tolerate procedure well. Bilateral Breath Sounds: Clear, Diminished   No  Kelle Darting Sep 05, 2016, 2035

## 2016-09-24 NOTE — Progress Notes (Signed)
PULMONARY / CRITICAL CARE MEDICINE   Name: [name] MRN: [id number] DOB: [birth year]    ADMISSION DATE:  08/08/2016  REFERRING MD:  Randal Buba (EDP)   CHIEF COMPLAINT:  PEA arrest   HISTORY OF PRESENT ILLNESS:   70 yo female found unresponsive at home with PEA after calling EMS for sudden onset of dyspnea.  Had about 40 minutes for ROSC.  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  SUBJECTIVE: Family spoke with RN overnight, plan to withdraw care at 7:30 PM tonight.  VITAL SIGNS: BP (!) 186/69 (BP Location: Left Arm)   Pulse 94   Temp (!) 96.6 F (35.9 C) (Core (Comment))   Resp 18   Ht 5\' 6"  (1.676 m)   Wt 100.6 kg (221 lb 12.5 oz)   SpO2 100%   BMI 35.80 kg/m   VENTILATOR SETTINGS: Vent Mode: PRVC FiO2 (%):  [40 %] 40 % Set Rate:  [14 bmp] 14 bmp Vt Set:  [360 mL] 360 mL PEEP:  [5 cmH20] 5 cmH20 Plateau Pressure:  [10 cmH20-18 cmH20] 12 cmH20  INTAKE / OUTPUT: I/O last 3 completed shifts: In: 1337.5 [I.V.:537.5; NG/GT:800] Out: 800 [Urine:750; Stool:50]  PHYSICAL EXAMINATION: General: Obese woman, unresponsive off sedating meds.  Neuro: No response to painful stimuli, positive spont breaths,  HEENT: Pupils sluggish, no doll's eye and no gag reflex this AM Cardiovascular: regular, no murmur Lungs: Distant but CTA bilaterally Abdomen: soft, NT, ND and +BS Musculoskeletal: 1+ edema  LABS:  BMET  Recent Labs Lab 08/27/16 0530 08/28/16 0220 09-05-2016 0217  NA 142 141 146*  K 3.6 4.2 4.8  CL 113* 112* 114*  CO2 20* 20* 22  BUN 81* 95* 141*  CREATININE 3.08* 3.08* 3.31*  GLUCOSE 152* 199* 266*   Electrolytes  Recent Labs Lab 08/25/16 0227 08/26/16 0252 08/27/16 0530 08/28/16 0220 09/05/2016 0217  CALCIUM 9.2 9.0 8.8* 8.6* 9.5  MG 1.7 1.7  --   --  2.7*  PHOS 4.8* 3.6  --   --  6.3*   CBC  Recent Labs Lab 08/26/16 0252 08/27/16 0530 2016-09-05 0217   WBC 18.0* 11.6* 13.8*  HGB 8.3* 8.3* 7.5*  HCT 26.0* 25.8* 23.7*  PLT 253 234 282   Coag's No results for input(s): APTT, INR in the last 168 hours.  Sepsis Markers  Recent Labs Lab 08/25/16 0227  PROCALCITON 6.58   ABG  Recent Labs Lab 08/25/16 0434 2016-09-05 0405  PHART 7.425 7.361  PCO2ART 32.0 43.0  PO2ART 176* 145*   Liver Enzymes No results for input(s): AST, ALT, ALKPHOS, BILITOT, ALBUMIN in the last 168 hours.  Cardiac Enzymes No results for input(s): TROPONINI, PROBNP in the last 168 hours.  Glucose  Recent Labs Lab 08/30/16 1117 08/30/16 1557 08/30/16 2009 08/30/16 2354 2016-09-05 0404 Sep 05, 2016 0748  GLUCAP 282* 290* 239* 256* 259* 250*   Imaging No results found.  STUDIES:  CT head 10/30 >> no acute findings CT abd/pelvis 10/30 >> lower lobe consolidation, small abdominal wall abscess with cellulitis EEG 10/30 >> diffuse background suppression V/Q scan 10/30 >> normal  Echo 10/31 >> EF 65 to 70%, grade 1 DD, PAS 39 mmHg MRI brain 11/3 >> severe global anoxic brain injury  CULTURES: Urine 10/30 >> negative Blood 10/30 >>  C diff 10/5 >> negative  ANTIBIOTICS: Vancomycin 10/29 >> 11/02 Imipenem 10/30 >> 10/30 Meropenem 10/30 >> Anidulafungin 10/30 >> 11/02  SIGNIFICANT EVENTS: 10/30  Admit, surgery consulted 10/31 Myoclonus 11/02 Neuro consulted, off insulin gtt 11/03 off diprivan  LINES/TUBES: ETT 10/30>>>  DISCUSSION: 70 yo female with PEA cardiac arrest with 40 minutes for ROSC.  Developed myoclonus.    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 Supported by MRI and EEG - Plan on withdrawal today, will place orders for withdrawal but no extubation til family arrives. - Fentanyl for now until morphine is started tonight.  Acute hypoxic respiratory failure from cardiac arrest and aspiration pneumonia. - Full vent support until family ready to withdraw tonight. - F/u CXR  intermittently  PEA cardiac arrest. Hx of CAD, HTN, HLD. - Continue Norvasc until withdrawal tonight.  AKI - baseline creatinine 1.9. - D/C further blood draws.  Aspiration pneumonia. Abdominal wall abscess with cellulitis. - Day 7/7 of abx, course complete.  Anemia of critical illness. - D/C further blood draws  DM type II. - D/C CBGs given withdrawal tonight.  Nutrition. - Tube feeds  Hx of RA, temporal arteritis, pyoderma gangrenosum. - Continue prednisone til withdrawal tonight.  DVT prophylaxis - SQ heparin SUP - protonix Goals of care - DNR  Withdraw order set in place, plan to withdraw tonight at 7:30 per family's wishes.  The patient is critically ill with multiple organ systems failure and requires high complexity decision making for assessment and support, frequent evaluation and titration of therapies, application of advanced monitoring technologies and extensive interpretation of multiple databases.   Critical Care Time devoted to patient care services described in this note is  35  Minutes. This time reflects time of care of this signee Dr Jennet Maduro. This critical care time does not reflect procedure time, or teaching time or supervisory time of PA/NP/Med student/Med Resident etc but could involve care discussion time.  Rush Farmer, M.D. [facility] Pulmonary/Critical Care Medicine. Pager: [phone]. After hours pager: [phone].

## 2016-09-24 NOTE — Progress Notes (Signed)
Per nursing, plan is to withdraw care this evening. I agree with this plan given the low likelihood of recovery. Her exam is unchanged today. Intact brainstem, but no meaningful interaction.  Please call with any further questions or concerns.   Roland Rack, MD [facility] [phone]  If 7pm- 7am, please page neurology on call as listed in [place].

## 2016-09-24 DEATH — deceased

## 2017-10-26 IMAGING — CT CT ABD-PELV W/ CM
2 of 5 series · 16 of 46 positions shown, 18 images · IV contrast (iopamidol)
Comparison: None.

CLINICAL DATA: Urinary frequency for 2 weeks. Pelvic and suprapubic
pain since yesterday. Low back pain. Nausea.

EXAM:
CT ABDOMEN AND PELVIS WITH CONTRAST
TECHNIQUE: Multidetector CT imaging of the abdomen and pelvis was performed
using the standard protocol following bolus administration of
intravenous contrast.
CONTRAST:  75mL 51TLFT-E00 IOPAMIDOL (51TLFT-E00) INJECTION 61%

[Series 2: axial st · axial · 0.89mm/px · z∈[-486,-51]mm · 13 of 97 slices shown, 15 images]
[im 5/97  soft-tissue]
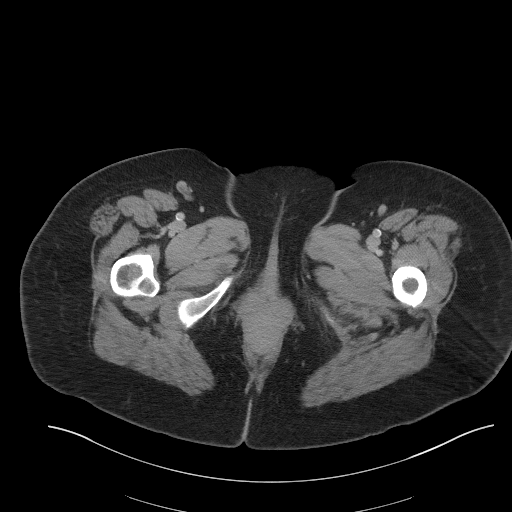
[im 5/97  bone]
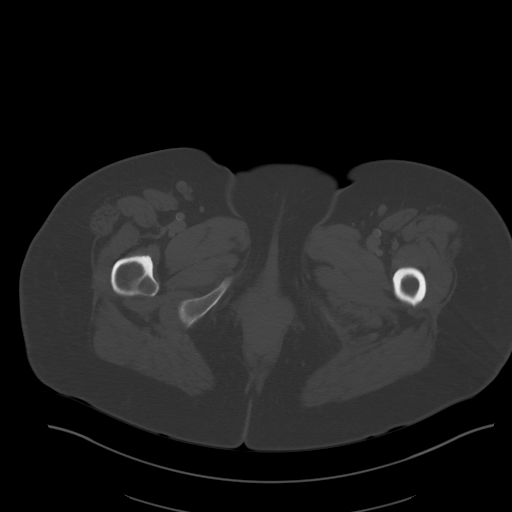
[im 15/97  soft-tissue]
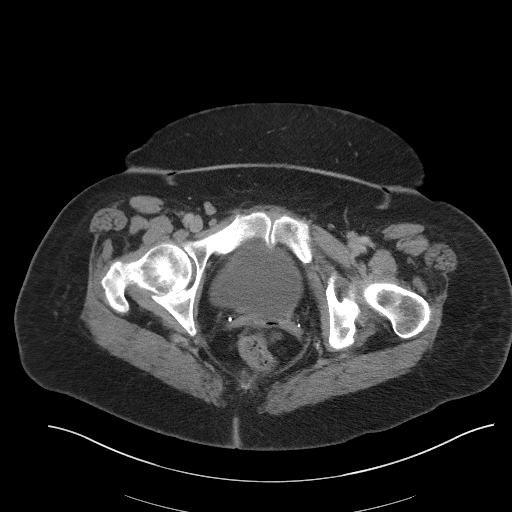
[im 20/97  soft-tissue]
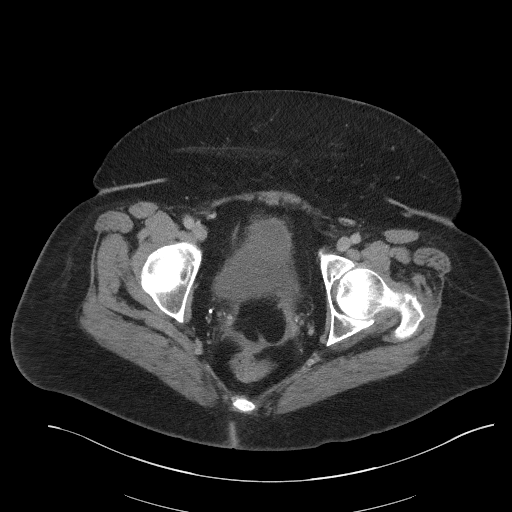
[im 29/97  soft-tissue]
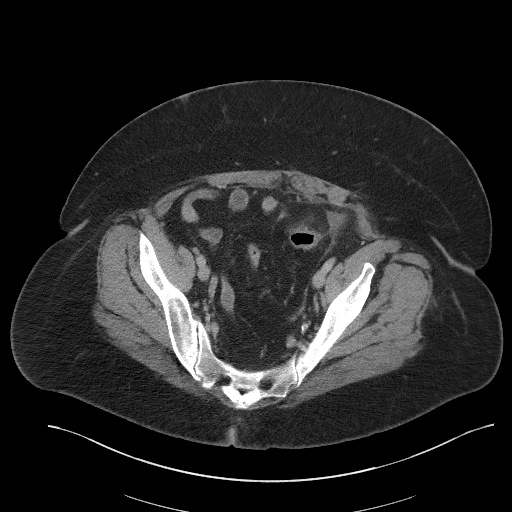
[im 34/97  soft-tissue]
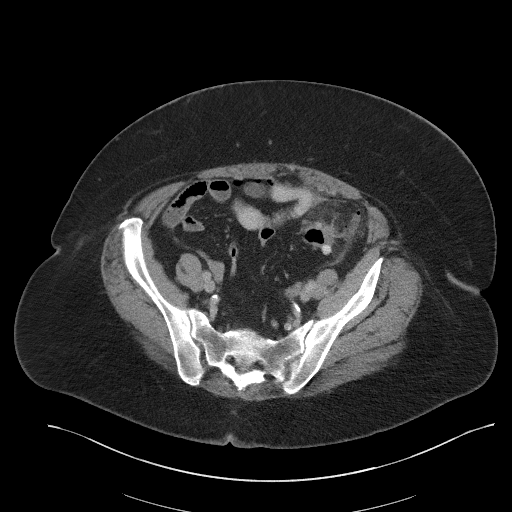
[im 44/97  soft-tissue]
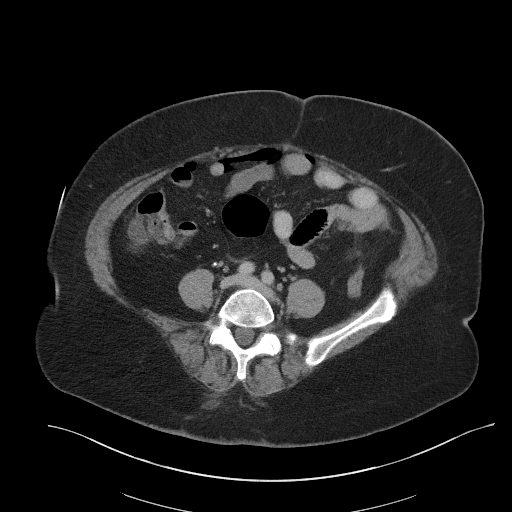
[im 49/97  soft-tissue]
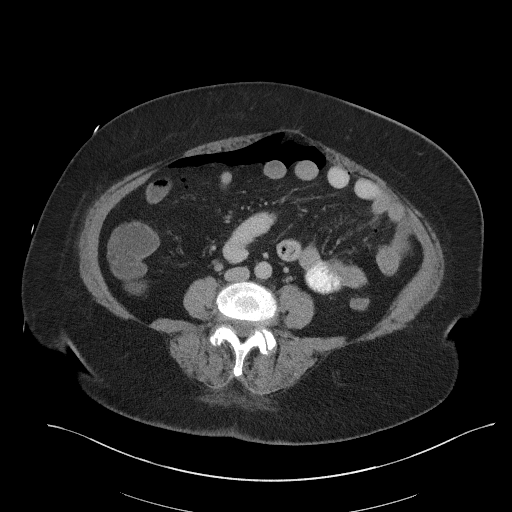
[im 53/97  soft-tissue]
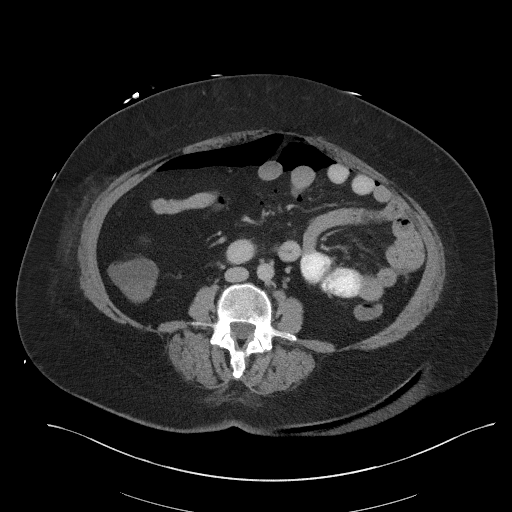
[im 63/97  soft-tissue]
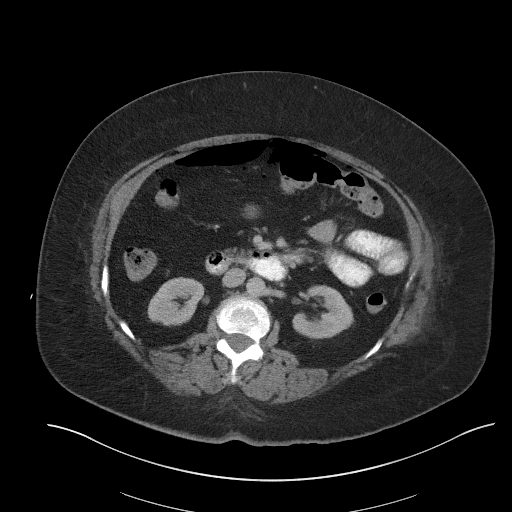
[im 63/97  bone]
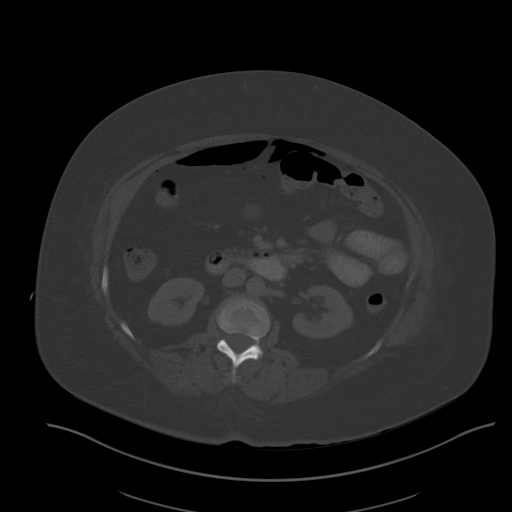
[im 68/97  soft-tissue]
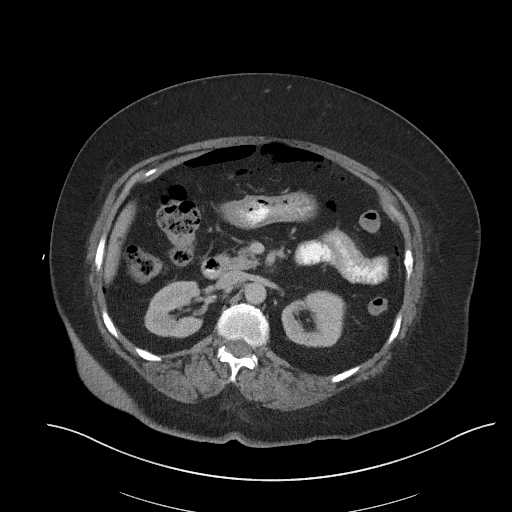
[im 77/97  soft-tissue]
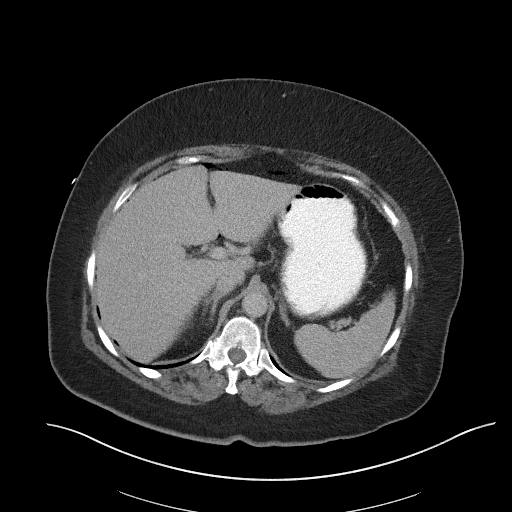
[im 82/97  soft-tissue]
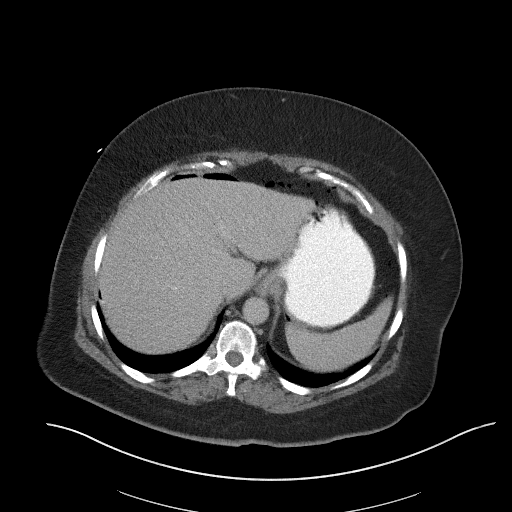
[im 92/97  soft-tissue]
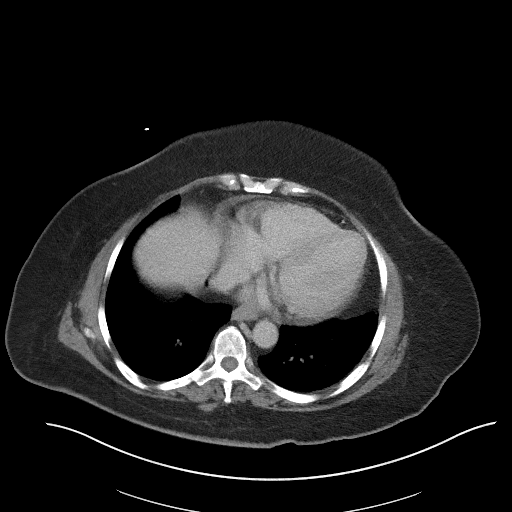

[Series 5: coronal st · coronal · 0.83mm/px · 3 of 98 slices shown]
[im 33/98  soft-tissue]
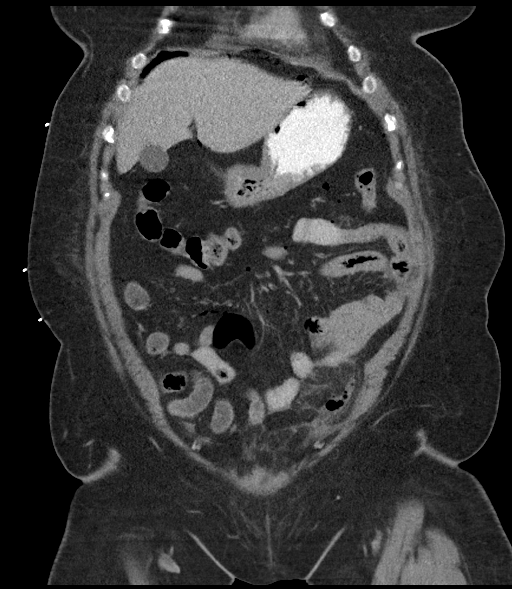
[im 44/98  soft-tissue]
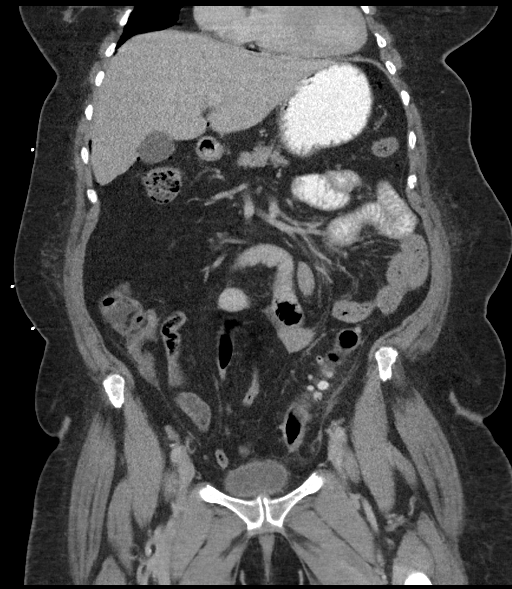
[im 54/98  soft-tissue]
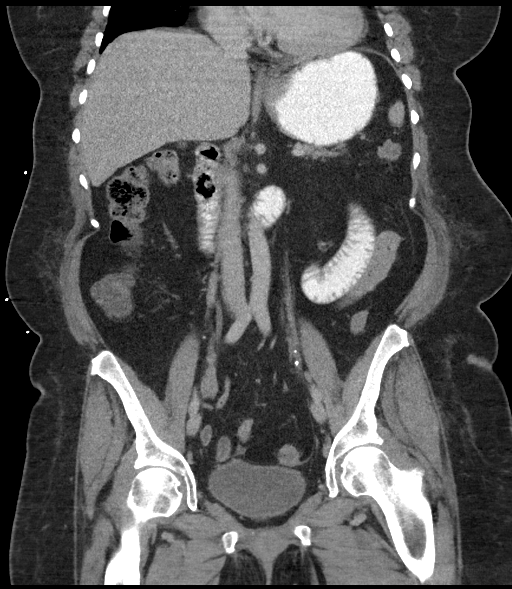

[16 of 46 positions shown; findings below may reference images not displayed]

FINDINGS: Dependent atelectasis in the lung bases.

There is pneumoperitoneum with free intra-abdominal air throughout
the abdomen and pelvis. Mild infiltration in the mesentery. Small
amount of free fluid in the pelvis. Diverticula in the sigmoid colon
with inflammatory stranding around the proximal sigmoid colon. Small
pericolonic abscess measuring 1.4 x 2.7 cm. Changes are consistent
with perforated acute diverticulitis. No small or large bowel
distention.

Small esophageal hiatal hernia. The liver, spleen, gallbladder,
pancreas, adrenal glands, kidneys, abdominal aorta, inferior vena
cava, and retroperitoneal lymph nodes are unremarkable.

Pelvis: Bladder wall is not thickened. No pelvic mass or
lymphadenopathy. Appendix is normal. No destructive bone lesions.
IMPRESSION: Perforated acute diverticulitis with diffuse free intraperitoneal
air. Small pericolonic abscess adjacent to the junction of the
descending and sigmoid colon.

These results were called by telephone at the time of interpretation
on 06/08/2016 at [DATE] to Dr. MARIAMA DANNA , who verbally
acknowledged these results.

## 2017-10-27 IMAGING — DX DG CHEST 1V PORT
1 series · 1 of 1 positions shown · non-contrast
Comparison: 11/05/2015

CLINICAL DATA: Central line placement

EXAM:
PORTABLE CHEST 1 VIEW

[chest ap]
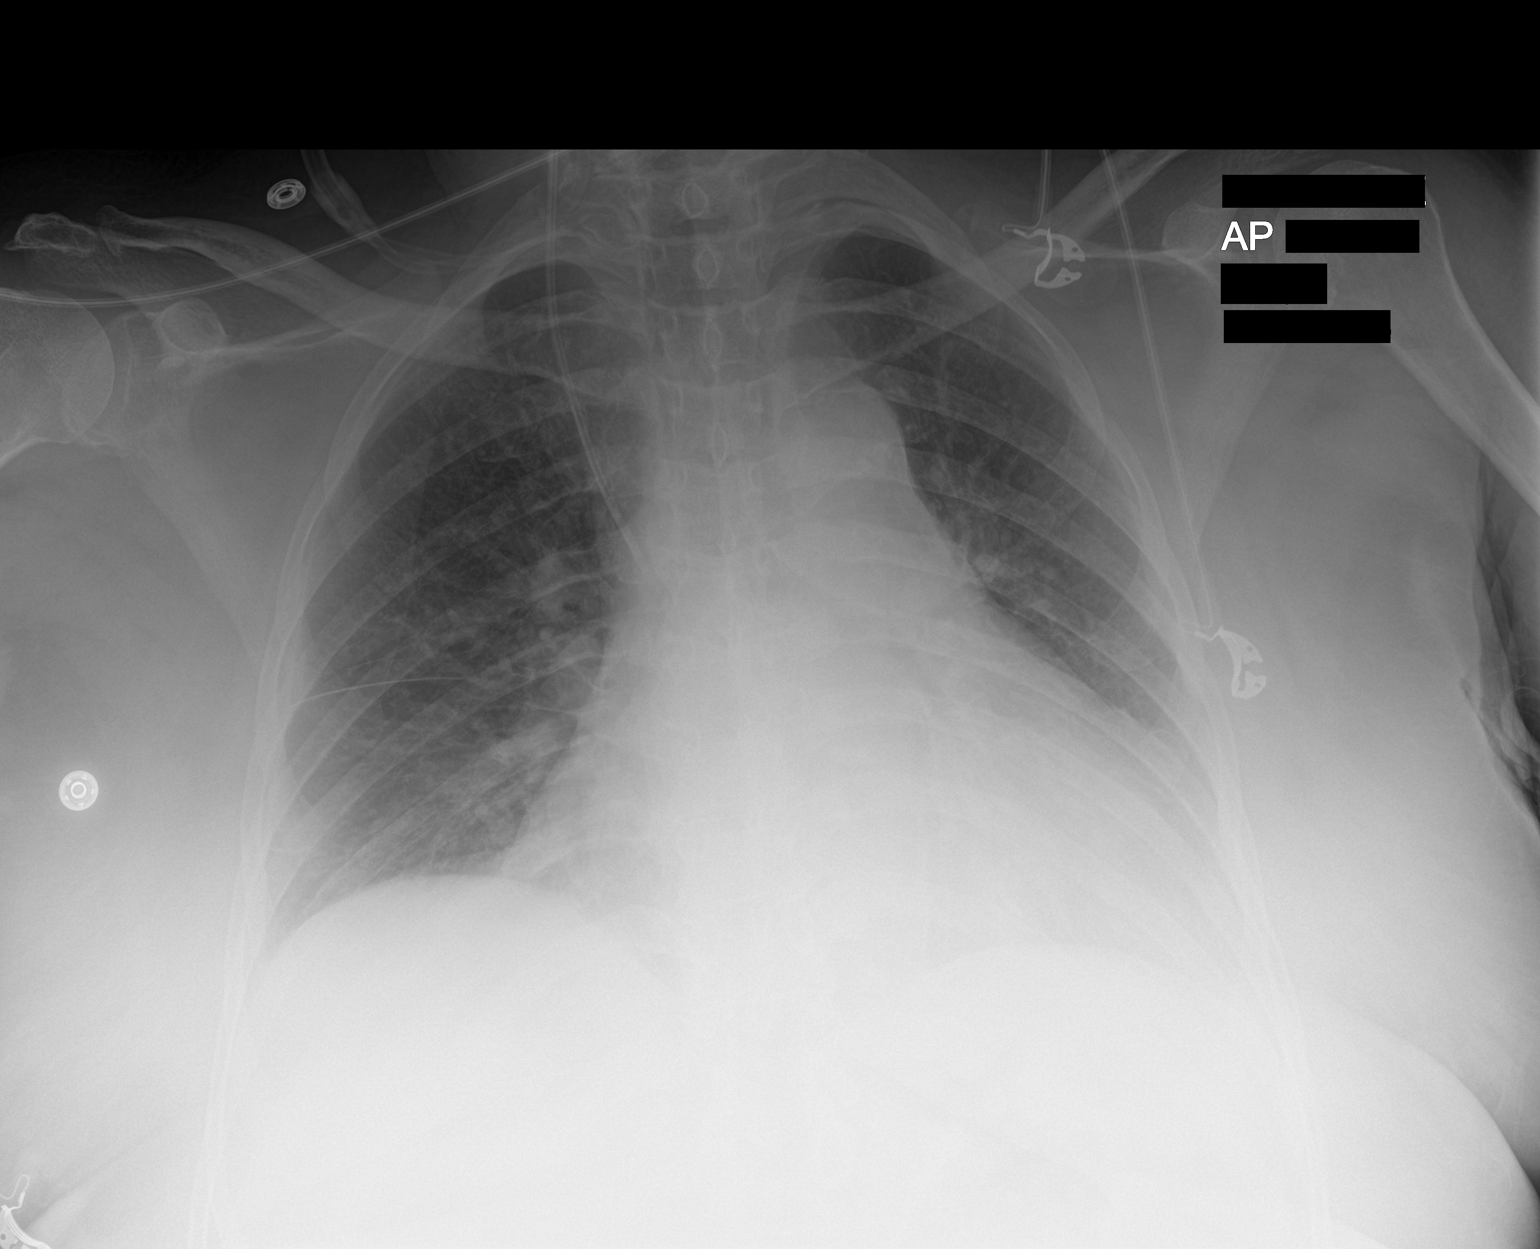

[1 of 1 positions shown; findings below may reference images not displayed]

FINDINGS: Mild cardiomegaly. Mediastinal shadows are normal. Right internal
jugular central line has its tip in the SVC just below the azygos
level. No pneumothorax or hemothorax. Lungs clear.
IMPRESSION: Central line tip in the SVC just below the azygos level, 3 cm above
the right atrium. No complication.

## 2017-10-29 IMAGING — DX DG CHEST 1V PORT
1 series · 1 of 1 positions shown · non-contrast
Comparison: PA and lateral chest x-ray June 09, 2016

CLINICAL DATA: Onset of wheezing today; history of CHF, coronary
artery disease, former smoker

EXAM:
PORTABLE CHEST 1 VIEW

[chest ap]
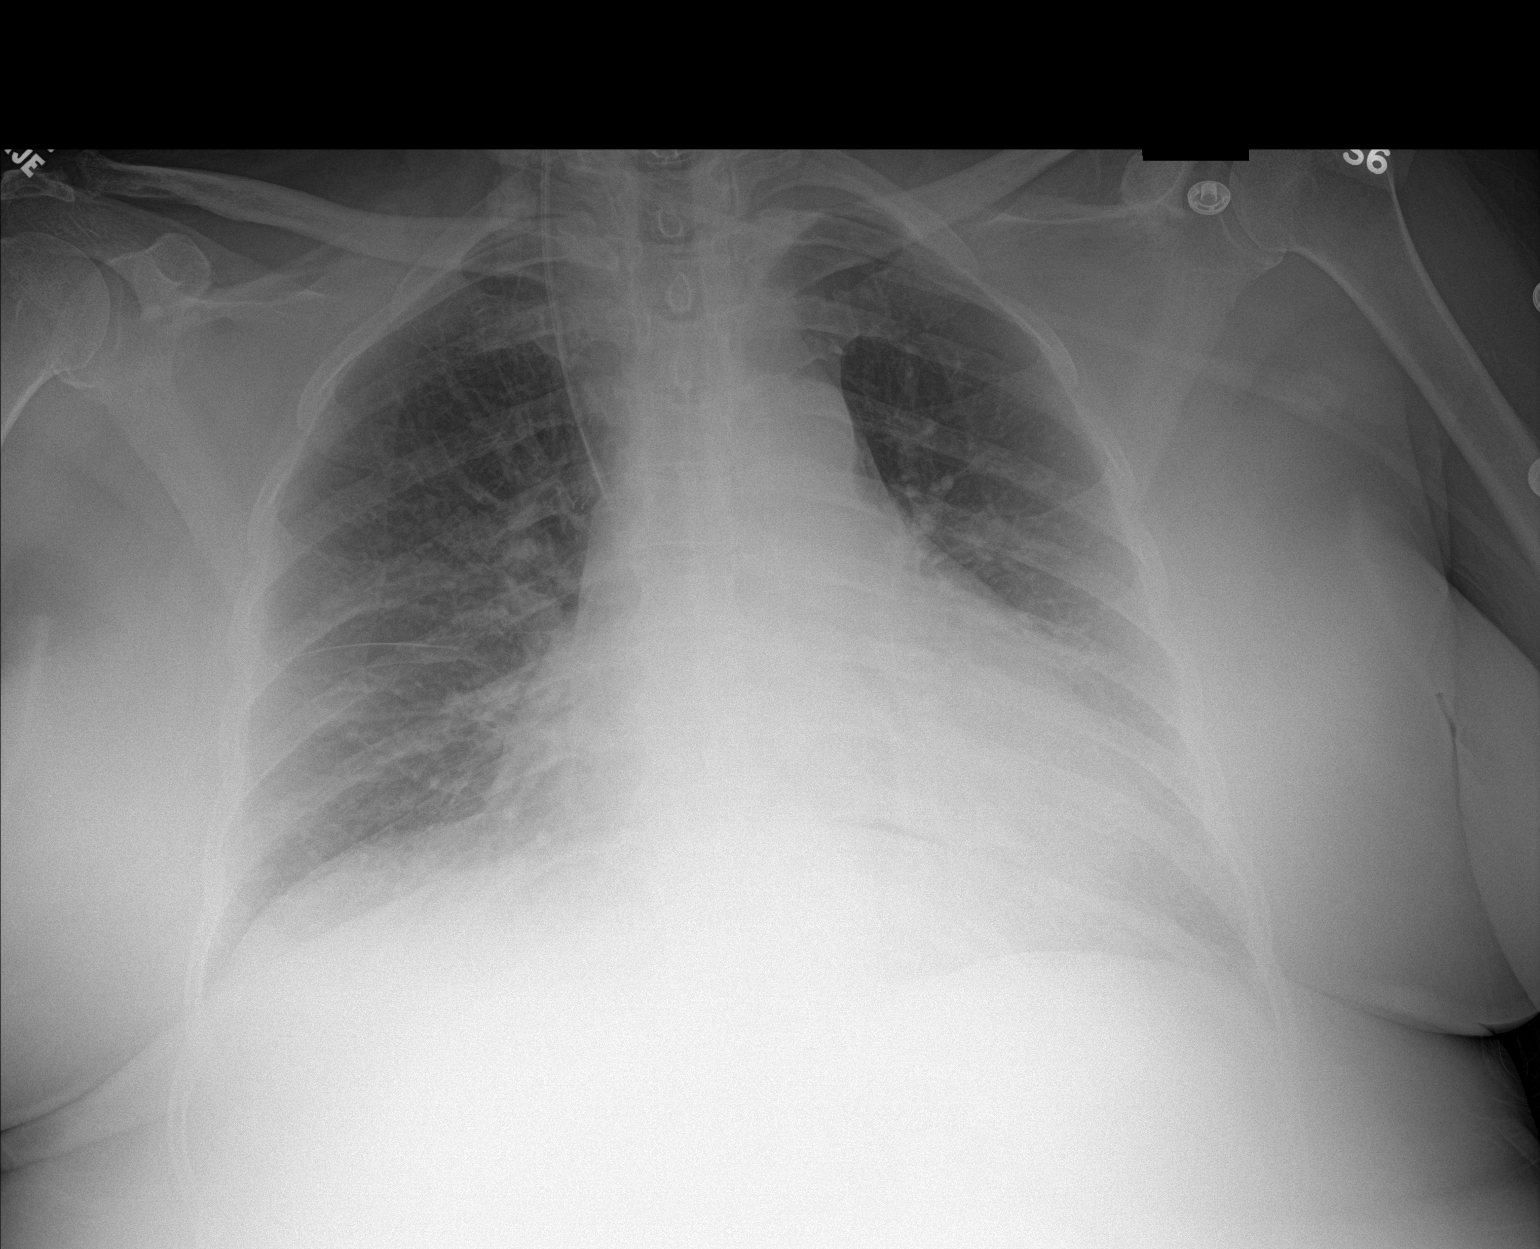

[1 of 1 positions shown; findings below may reference images not displayed]

FINDINGS: The lungs are adequately inflated. There is no focal infiltrate.
There is no pleural effusion or pneumothorax. The cardiac silhouette
remains enlarged. The pulmonary vascularity is slightly more
conspicuous and mild interstitial edema on the right is suspected.
The right internal jugular venous catheter tip projects over the
midportion of the SVC. The bony thorax exhibits no acute
abnormality.
IMPRESSION: Low-grade CHF with mild interstitial edema more conspicuous than on
yesterday's study. No alveolar pneumonia.

## 2017-11-02 IMAGING — CR DG ABDOMEN ACUTE W/ 1V CHEST
1 series · 3 of 3 positions shown · non-contrast
Comparison: Chest x-ray June 11, 2016 and abdominal radiograph
of June 13, 2016.

CLINICAL DATA: Status post sigmoid colectomy 5 days ago; history of
CHF, coronary artery disease, former smoker, diabetes

EXAM:
DG ABDOMEN ACUTE W/ 1V CHEST

[Series 1: dg abd acute w/chest · 0.14mm/px · 3 of 3 slices shown]
[im 1/3]
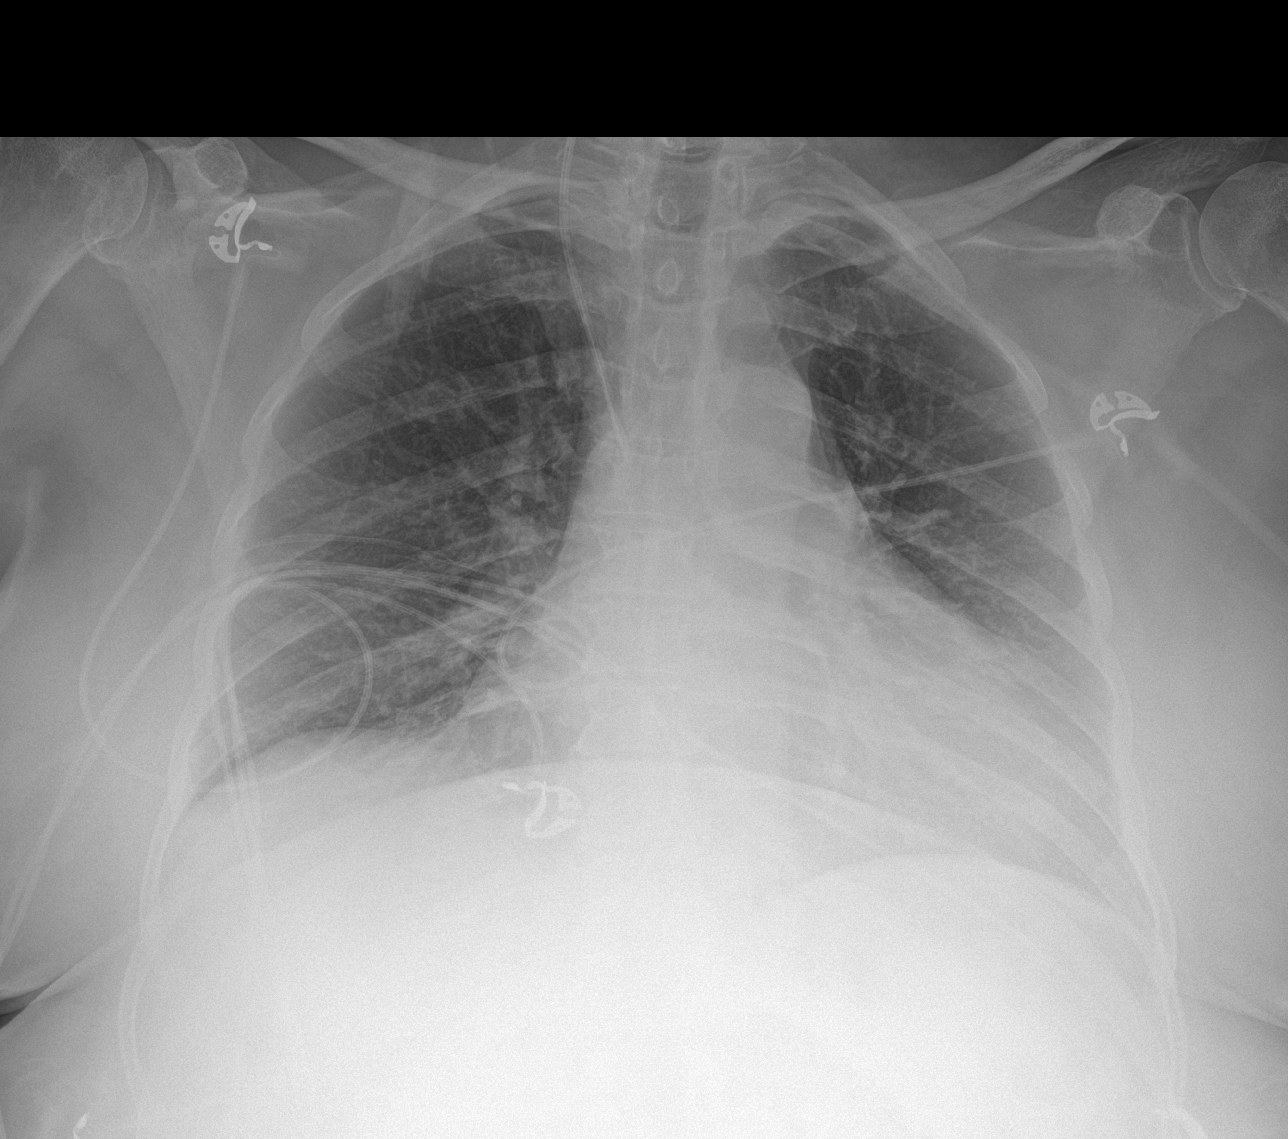
[im 2/3]
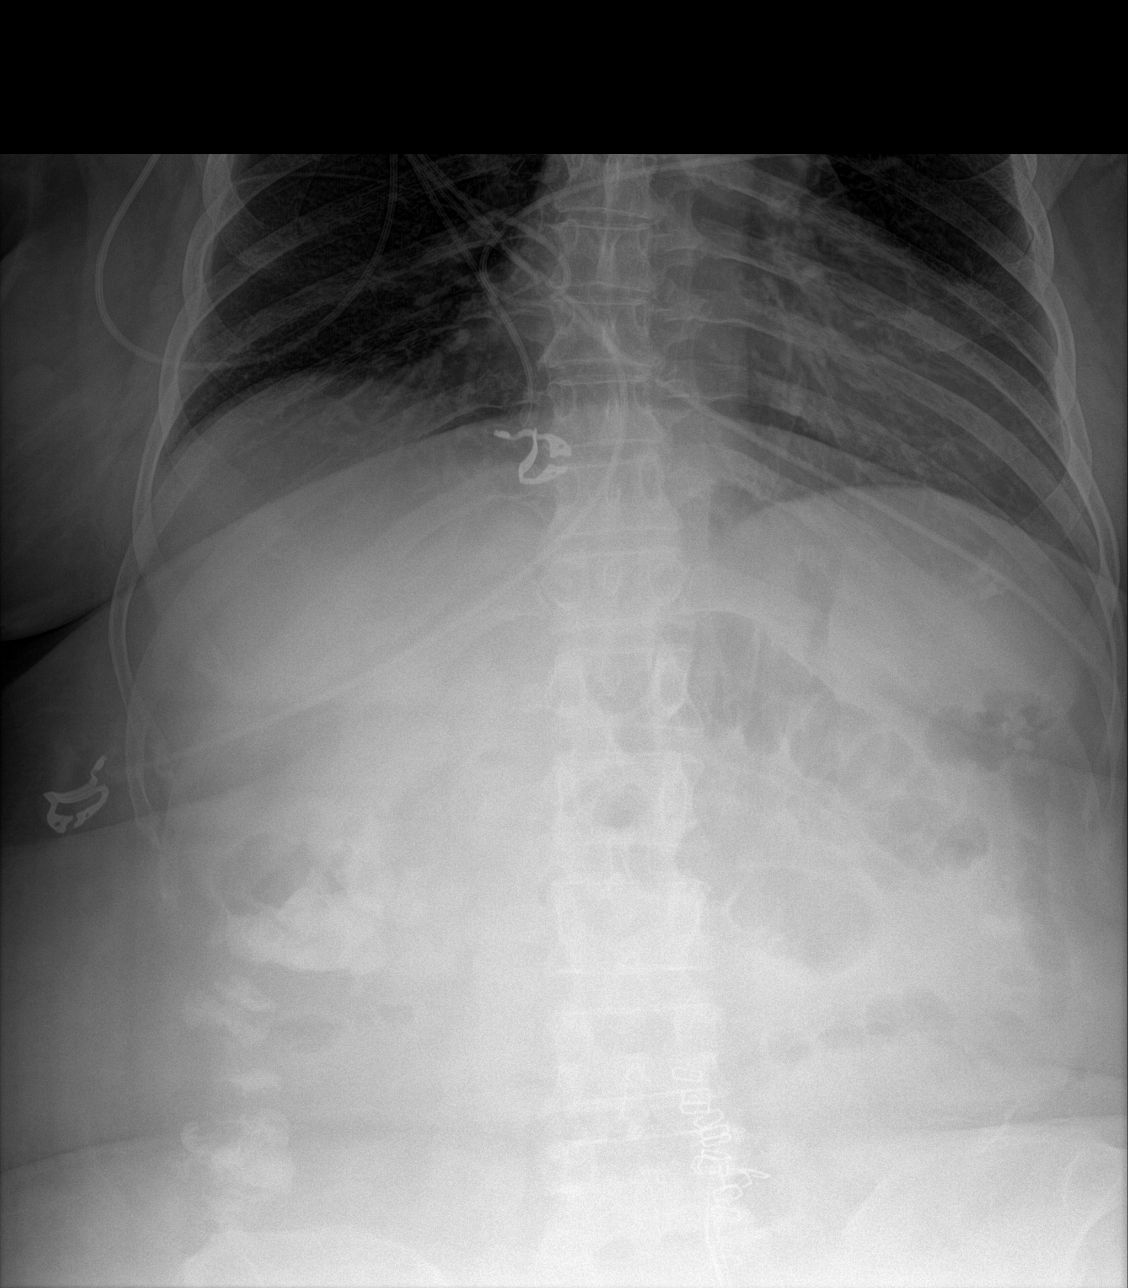
[im 3/3]
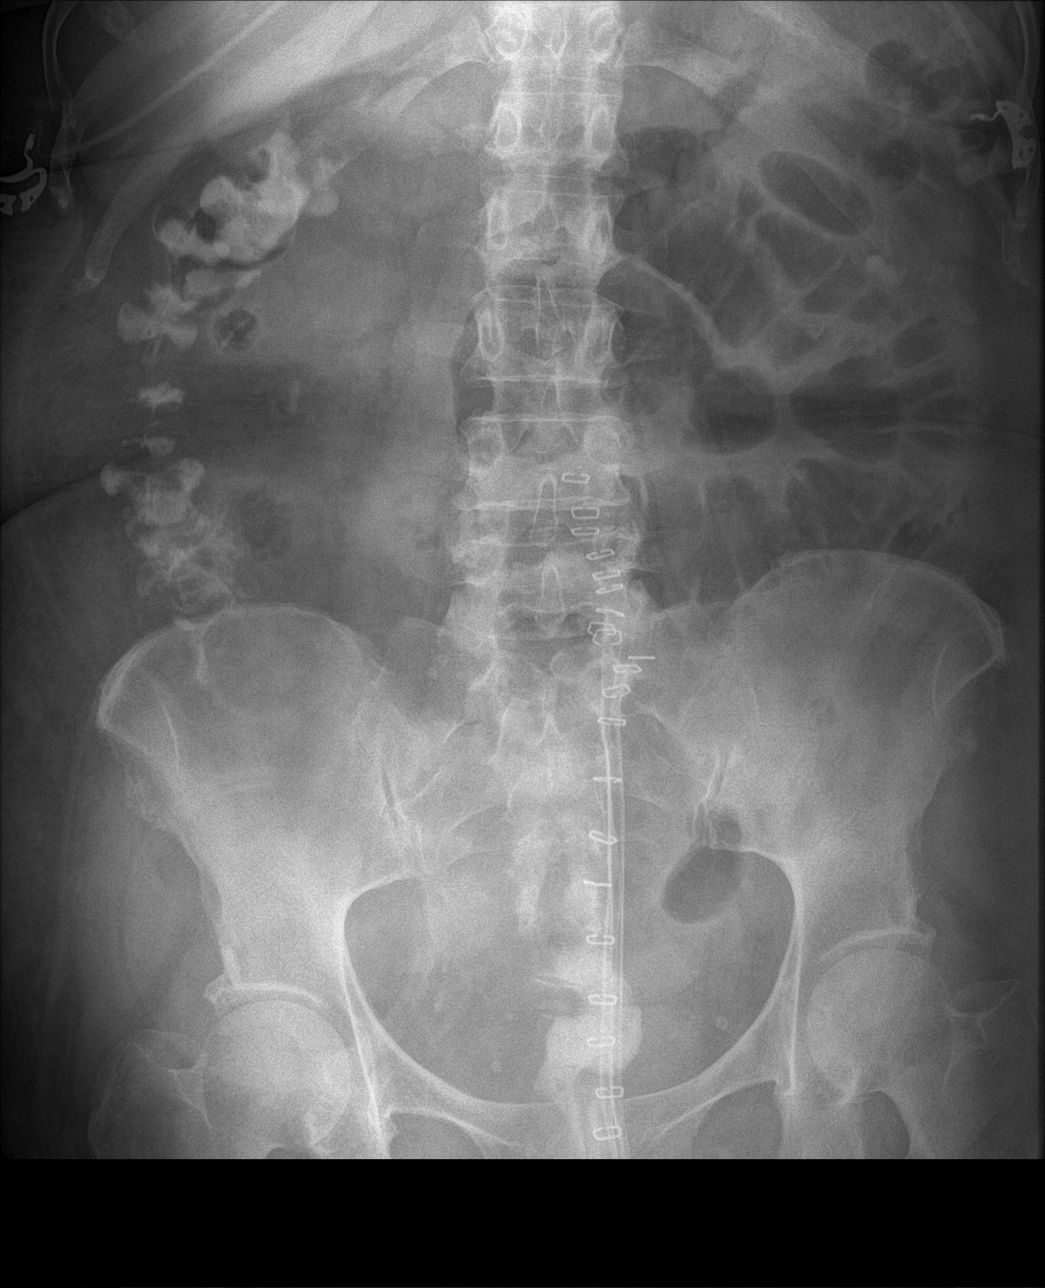

[3 of 3 positions shown; findings below may reference images not displayed]

FINDINGS: The lungs are adequately inflated. The interstitial markings are
mildly prominent. There is no alveolar infiltrate. There is no large
pleural effusion. The cardiac silhouette remains enlarged. The
central pulmonary vascularity is engorged. A right internal jugular
venous catheter tip projects over the proximal SVC.

Within the abdomen there is contrast in the right colon as well as
in the rectum. There are loops of mildly distended gas-filled small
bowel to the left of midline. These are less conspicuous than on
yesterday's study despite removal of the nasogastric tube. Vertical
lower abdominal and pelvic skin staples are present with the
drainage tube. There is calcification that projects over the lower
pole of the right kidney. There may be calcifications overlying the
left kidney. The bony structures exhibit no acute abnormalities.
IMPRESSION: Cardiomegaly with mild pulmonary vascular prominence consistent with
low-grade CHF. No pneumonia or significant pleural effusion.

Decreased gaseous distention of small-bowel loops.
